# Patient Record
Sex: Male | Born: 1950
Health system: Southern US, Community
[De-identification: ages and names within clinical notes are randomized; demographics above are authoritative.]

## PROBLEM LIST (undated history)

## (undated) DIAGNOSIS — F79 Unspecified intellectual disabilities: Secondary | ICD-10-CM

## (undated) DIAGNOSIS — Z87442 Personal history of urinary calculi: Secondary | ICD-10-CM

## (undated) DIAGNOSIS — Z8739 Personal history of other diseases of the musculoskeletal system and connective tissue: Secondary | ICD-10-CM

## (undated) DIAGNOSIS — R339 Retention of urine, unspecified: Secondary | ICD-10-CM

## (undated) DIAGNOSIS — I10 Essential (primary) hypertension: Secondary | ICD-10-CM

## (undated) DIAGNOSIS — E119 Type 2 diabetes mellitus without complications: Secondary | ICD-10-CM

---

## 2002-03-27 ENCOUNTER — Ambulatory Visit (HOSPITAL_COMMUNITY): Admission: RE | Admit: 2002-03-27 | Discharge: 2002-03-27 | Payer: Self-pay | Admitting: Internal Medicine

## 2003-03-30 ENCOUNTER — Ambulatory Visit (HOSPITAL_COMMUNITY): Admission: RE | Admit: 2003-03-30 | Discharge: 2003-03-30 | Payer: Self-pay | Admitting: Oral & Maxillofacial Surgery

## 2003-03-30 ENCOUNTER — Ambulatory Visit (HOSPITAL_BASED_OUTPATIENT_CLINIC_OR_DEPARTMENT_OTHER): Admission: RE | Admit: 2003-03-30 | Discharge: 2003-03-30 | Payer: Self-pay | Admitting: Oral & Maxillofacial Surgery

## 2005-01-16 ENCOUNTER — Inpatient Hospital Stay (HOSPITAL_COMMUNITY): Admission: AD | Admit: 2005-01-16 | Discharge: 2005-01-24 | Payer: Self-pay | Admitting: Internal Medicine

## 2009-04-29 ENCOUNTER — Ambulatory Visit (HOSPITAL_COMMUNITY): Admission: RE | Admit: 2009-04-29 | Discharge: 2009-04-29 | Payer: Self-pay | Admitting: Family Medicine

## 2009-11-11 ENCOUNTER — Ambulatory Visit (HOSPITAL_COMMUNITY): Admission: RE | Admit: 2009-11-11 | Discharge: 2009-11-11 | Payer: Self-pay | Admitting: Urology

## 2009-12-01 ENCOUNTER — Ambulatory Visit (HOSPITAL_COMMUNITY): Admission: RE | Admit: 2009-12-01 | Discharge: 2009-12-01 | Payer: Self-pay | Admitting: Urology

## 2010-01-24 ENCOUNTER — Ambulatory Visit (HOSPITAL_COMMUNITY): Admission: RE | Admit: 2010-01-24 | Discharge: 2010-01-24 | Payer: Self-pay | Admitting: Urology

## 2010-02-01 ENCOUNTER — Ambulatory Visit (HOSPITAL_COMMUNITY): Admission: RE | Admit: 2010-02-01 | Discharge: 2010-02-01 | Payer: Self-pay | Admitting: Urology

## 2010-02-08 ENCOUNTER — Ambulatory Visit (HOSPITAL_COMMUNITY)
Admission: RE | Admit: 2010-02-08 | Discharge: 2010-02-08 | Payer: Self-pay | Source: Home / Self Care | Admitting: Urology

## 2010-03-22 ENCOUNTER — Ambulatory Visit (HOSPITAL_COMMUNITY)
Admission: RE | Admit: 2010-03-22 | Discharge: 2010-03-22 | Payer: Self-pay | Source: Home / Self Care | Admitting: Urology

## 2010-03-31 ENCOUNTER — Ambulatory Visit (HOSPITAL_COMMUNITY)
Admission: RE | Admit: 2010-03-31 | Discharge: 2010-03-31 | Payer: Self-pay | Source: Home / Self Care | Attending: Urology | Admitting: Urology

## 2010-05-07 ENCOUNTER — Encounter: Payer: Self-pay | Admitting: Family Medicine

## 2010-06-28 LAB — GLUCOSE, CAPILLARY
Glucose-Capillary: 101 mg/dL — ABNORMAL HIGH (ref 70–99)
Glucose-Capillary: 122 mg/dL — ABNORMAL HIGH (ref 70–99)

## 2010-06-30 LAB — CBC
HCT: 39.9 % (ref 39.0–52.0)
Hemoglobin: 13.4 g/dL (ref 13.0–17.0)
RBC: 4.45 MIL/uL (ref 4.22–5.81)

## 2010-06-30 LAB — BASIC METABOLIC PANEL
BUN: 10 mg/dL (ref 6–23)
Calcium: 9.3 mg/dL (ref 8.4–10.5)
GFR calc Af Amer: >60 mL/min (ref >60)
GFR calc non Af Amer: >60 mL/min (ref >60)

## 2010-06-30 LAB — SURGICAL PCR SCREEN
MRSA, PCR: NEGATIVE
Staphylococcus aureus: NEGATIVE

## 2010-06-30 LAB — GLUCOSE, CAPILLARY: Glucose-Capillary: 154 mg/dL — ABNORMAL HIGH (ref 70–99)

## 2010-09-01 NOTE — Consult Note (Signed)
[name], DOLLAR               ACCOUNT NO.:  [id number]   MEDICAL RECORD NO.:  [id number]          PATIENT TYPE:  INP   LOCATION:  A215                          FACILITY:  [facility]   PHYSICIAN:  Kofi A. Gerilyn Pilgrim, M.D. DATE OF BIRTH:  01-03-1951   DATE OF CONSULTATION:  DATE OF DISCHARGE:                                   CONSULTATION   NEUROLOGICAL CONSULTATION   IMPRESSION:  Recurrent spells that are suspicious for seizures.  It is  unclear if these are provoked seizures or epileptic seizures, however, the  patient has had epilepsy in the past although he outgrew this.  Additionally  he has a baseline history of static encephalopathy.  Given the above, I  believe it is reasonable to treat the patient for presumed epileptic  seizures, although, again, we cannot say for sure these represent the  recurrence of epileptic seizures or provoked seizures.  For now we will  treat him and do a seizure work up including electroencephalogram and MRI.  If he remains seizure-free on medications he may be candidate for weaning  and discontinuation of the medication.   HISTORY:  This is a 60 year old African-American man who has a baseline  history of static encephalopathy.  The patient apparently was born full term  by vaginal delivery without any complications.  At the age of 1 the mother  noted that the patient initially had problems talking and developing.  He  apparently was able to speak some but may have regressed.  It seems more  like, however, that the patient may have not developed speaking ability.  It  appears, however, that he was able to ambulate, walk and talk well, the main  problem seemed to be a cognitive developmental delay and speaking  impairment.  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  The  family indicated he does follow some basic commands.  He cannot read or  write.  He is currently being taken care of by his siblings.  It appears the  patient had  intractable epileptic seizures during childhood but apparently  outgrew then by the age of 44.  His seizure medications were subsequently  discontinued.  It is unclear which seizure medication he was taking.  He has  essentially been seizure-free for the past 35 years until recently when he  was admitted to the hospital for three bouts of seizures, however, they were  associated with significant blood loss due to bleeding from his Foley  catheter site which was placed.  The description of the spells seems  consistent with generalized tonic/clonic events with stiffening, shaking,  loss of consciousness and some postictal lethargy. He had a fourth event  which was less clear if this was a seizure.  It was associated more with his  limbs becoming limp and the patient simply passing out.  Again, all four  events occurred in the setting of gross and significant bleeding from his  Foley catheter site.   PAST MEDICAL HISTORY:  As stated in history of present illness.  Some mutism  with static encephalopathy, remote seizure  disorder which he has been  seizure-free for the past 35 years until recently.  Diabetes and  hypertension.   ADMISSION MEDICATIONS:  1.  Metformin.  2.  Maxzide.   SOCIAL HISTORY:  The patient resides with his siblings, particularly his  brother with help from other siblings.   PHYSICAL EXAMINATION:  GENERAL APPEARANCE:  Examination shows an average-  weight, pleasant man in no acute distress.  VITAL SIGNS:  Temperature 99.1, blood pressure 121/80, pulse 98,  respirations 20.  HEENT:  Evaluation shows head is normocephalic, atraumatic.  NECK:  Supple.  ABDOMEN:  Soft.  EXTREMITIES:  Show significant varicosities in the legs.  NEUROLOGICAL:  Mentation:  Patient is awake, alert, he is mute. He does  follow commands well and briskly especially midline commands.  He is able to  follow some simple appendicular commands but has more difficulty with  commands such as  holding up two fingers on either hand.  Cranial nerves II-  XII evaluation pupils are 3 mm and reactive.  Extraocular movements are  intact. No nystagmus is seen.  Visual fields limited but appear to be  intact.  Facial muscle strength is symmetric.  Tongue is midline.  Uvula is  midline.  shoulder shrug normal.  Motor examination shows antigravity  strength throughout.  Reflexes are symmetric and normal.  Plantar reflexes  downgoing.  He responds to pain symmetrically.  Coordination shows no  tremors, no dysmetria noted.   CLINICAL DATA:  Supportive data includes head CT scan of the brain shows no  acute process, essentially negative CT scan.  Sodium 131, potassium 3.5,  chloride 96, cO2 19, creatinine 1.0, glucose 188, calcium 8.5, white blood  cell count 4.9, hemoglobin 12.6 and platelet count 144,000.  Chest x-ray  shows bilateral bibasilar atelectasis with cardiomegaly.  Liver enzymes  normal.  Urinalysis is negative.   Thank you for this consultation.      Kofi A. Gerilyn Pilgrim, M.D.  Electronically Signed     KAD/MEDQ  D:  01/17/2005  T:  01/17/2005  Job:  [id number]

## 2010-09-01 NOTE — Group Therapy Note (Signed)
[name], VANNOTE               ACCOUNT NO.:  [id number]   MEDICAL RECORD NO.:  [id number]          PATIENT TYPE:  INP   LOCATION:  A215                          FACILITY:  [facility]   PHYSICIAN:  Catalina Pizza, M.D.        DATE OF BIRTH:  1950-06-24   DATE OF PROCEDURE:  01/23/2005  DATE OF DISCHARGE:                                   PROGRESS NOTE   SUBJECTIVE:  The patient is alert, sitting in chair with family member by  side.  No acute distress.  The patient has been rubbing his belly, but has  not had any specific complaints to any of his family members.  He continues  to have mild pink discoloration in his Foley bag.   OBJECTIVE:  VITAL SIGNS:  Temperature 98.0, systolic blood pressure 122/57,  pulse 89, respirations 20.  CBG's have ranged from 214, 119, 171, 195.  GENERAL APPEARANCE:  Well-nourished African American male in no acute  distress.  HEENT:  Pupils equal, round and reactive to light and accommodation.  Oropharynx is clear.  HEART:  Regular rate and rhythm.  No murmurs, gallops, rubs.  LUNGS:  Clear to auscultation bilaterally.  ABDOMEN:  Mildly protuberant but soft.  No signs or tenderness in palpating  epigastric area and upper quadrant per patient's expression.  Positive bowel  sounds.  EXTREMITIES:  Pulse 2+ in all extremities.  NEUROLOGICAL:  No deficits.  Foley in place.  Continues to have some mild  pinkness discoloration to the urine.   LABORATORY DATA:  CBC 9.3, hemoglobin 13.4, platelets 220,000.  B-met shows  sodium 132, potassium 3.3, chloride 91, CO2 33, glucose 208, BUN 16,  creatinine 1.0, calcium 9.0.   ASSESSMENT:  African American male here for treatment of tonic clonic  seizures and also having continued hematuria from Foley trauma.   PLAN:  1.  Tonic clonic seizures, was previously evaluated by Dr. Gerilyn Pilgrim and will      continue on the valproic acid for seizure prophylaxis.  2.  Hypertension.  Blood pressure under good control.  Continue  Maxzide once      daily.  3.  History of hypokalemia, does have some mild low potassium at this time      at 3.3.  Will start on a daily dose of low-dose potassium, probably      secondary to Maxzide.  4.  Diabetes mellitus type 2.  Blood sugars have been ranging in the 100-200      range.  Not ideal control at this time, but just started on increased      dose of metformin.  May need to add further medications to his regimen,      once we are stable.  5.  Hypercholesterolemia.  Continue Lipitor.  6.  Foley trauma.  Dr. Jerre Simon consulted on the patient yesterday and      continued to flush Foley q.8h.  Urine appears to be more clear today      than previous, and I appreciate consult for that.  If I feel like it is  much clear in the      morning, will discontinue Foley and make sure patient can urinate on his      own and possibly discharge.  7.  Anemia.  The patient's hemoglobin today is 13.4, up from 11.5 several      days ago.  I do not need to have any further intervention.      Catalina Pizza, M.D.  Electronically Signed     ZH/MEDQ  D:  01/23/2005  T:  01/23/2005  Job:  [id number]

## 2010-09-01 NOTE — Consult Note (Signed)
NAMESEGUNDO, MAKELA               ACCOUNT NO.:  [id number]   MEDICAL RECORD NO.:  [id number]          PATIENT TYPE:  INP   LOCATION:  A215                          FACILITY:  [facility]   PHYSICIAN:  Ky Barban, M.D.DATE OF BIRTH:  Aug 26, 1950   DATE OF CONSULTATION:  DATE OF DISCHARGE:                                   CONSULTATION   A 61 year old gentleman who is a patient of Dr. Catalina Pizza.  I was asked to  see him because he has gross hematuria. The patient has a lifelong history  of having seizures.  He presented in the emergency room.  For some reason, a  Foley catheter was inserted.  The patient's Foley catheter __________.  He  continued to have hematuria.  He was brought back and now has a Foley  catheter.  Urine still grossly bloody, so I was asked to see him.  The  history is obtained through the chart.  The patient was unable to give any  history.   I have looked at his Foley catheter.  The urine is almost clear.   IMPRESSION:  He probably has some blood clots in his bladder, so will  irrigate his bladder and keep the catheter in until the urine clears up.  It  should stop bleeding now.  I will follow him.   Thank you Dr. Margo Aye in allowing [place] to see this patient.      Ky Barban, M.D.  Electronically Signed     MIJ/MEDQ  D:  01/22/2005  T:  01/22/2005  Job:  [id number]

## 2010-09-01 NOTE — Discharge Summary (Signed)
[name], [name]               ACCOUNT NO.:  [id number]   MEDICAL RECORD NO.:  [id number]          PATIENT TYPE:  INP   LOCATION:  A215                          FACILITY:  [facility]   PHYSICIAN:  Catalina Pizza, M.D.        DATE OF BIRTH:  06/06/1950   DATE OF ADMISSION:  01/16/2005  DATE OF DISCHARGE:  10/11/2006LH                                 DISCHARGE SUMMARY   DISCHARGE DIAGNOSES:  1.  Seizure disorder/tonic-clonic seizures.  2.  Hypertension.  3.  Hypokalemia.  4.  Diabetes mellitus type 2.  5.  Hypercholesterolemia.  6.  Foley trauma.  7.  Anemia.   DISCHARGE MEDICATIONS:  1.  Maxzide 37.5/25 once daily.  2.  Lipitor 20 mg p.o. daily.  3.  Metformin XR 1000 mg p.o. daily.  4.  Depakene 250 mg 5 mL take 10 mL b.i.d.  5.  Septra DS take one tablet daily until Foley removed.   BRIEF HISTORY OF PRESENT ILLNESS:  [name] is a 60 year old African-  American male with the above mentioned medical problems who came in with  syncopal type event, question with whether seizure activity was apparent.  He had had some slight blood loss from wound on his right leg as well as  Foley trauma from upon discharge from the emergency department prior to his  admission.  He was assessed by Dr. Gerilyn Pilgrim and felt that this, given his  history, could be seizure activity and was admitted for further evaluation.   LABORATORIES:  Pertinent laboratories during the hospitalization and upon  discharge:  Hemoglobin A1c of 7.4.  Lipid profile showed total cholesterol  108, HDL 36, LDL 64.  Valproic acid level drawn on October 9 was 79.  Initial CBC was 12.6 and trended down to a low of 10.7 and then upon  discharge hemoglobin was back up to 12.5.  Last known BMET upon discharge  was sodium 132, potassium 3.3, chloride 91, CO2 of 33, glucose 208, BUN of  16, creatinine of 1, calcium of 9.   Imaging studies upon admission showed a CT scan which showed negative  noncontrasted head CT for any  abnormalities.  Chest x-ray showed low lung  volumes with bibasilar atelectasis and cardiomegaly, but no significant  pulmonary process.   PHYSICAL EXAMINATION:  VITAL SIGNS:  Temperature 97.5, systolic blood  pressure 123/75, pulse 88, respirations 20.  CBGs were 226, 178, 152, 201,  respectively over the last day.  GENERAL:  Well-nourished African-American male in no acute distress.  HEENT:  Pupils are equal, round, and reactive to light and accommodation.  Oropharynx clear.  HEART:  Regular rate and rhythm.  No murmurs, rubs, or gallops.  LUNGS:  Clear to auscultation bilaterally.  ABDOMEN:  Mildly protuberant, but soft.  No tenderness.  Positive bowel  sounds.  EXTREMITIES:  2+ pulses in all extremities.  No signs of bleeding in his  legs from his leg wound well healed at this time.  NEUROLOGIC:  No deficits.  GENITOURINARY:  Foley in place and dressing is in place around penis with no  significant  bleeding noted.  Urine appears clear at this time.   ASSESSMENT:  A 60 year old African-American male with tonic-clonic seizures  and here due to Foley trauma.   HOSPITAL COURSE:  #1 - TONIC-CLONIC SEIZURES:  Was assessed by Dr. Gerilyn Pilgrim  and attempted to get MRI but due to agitation was unable to do this.  Is to  follow up with Dr. Gerilyn Pilgrim in one month and continue on the Depakote 500 mg  b.i.d.  His level was checked and was within therapeutic range during  hospitalization.  He has not had any other seizure activity at this time.   #2 - FOLEY TRAUMA:  He upon admission had pulled out his Foley and caused  significant bleeding and Foley was replaced two days later and then even  with Foley in placed continued to have bleeding and hematuria.  Dr. Jerre Simon  was consulted and assessed patient today on day of discharge he was called  for bleeding around Foley and dressing was placed and replacement of Foley  was done.  Patient will need follow-up with Dr. Jerre Simon or Dr. Rito Ehrlich in  the  next two to five days to remove the Foley and assess if any further  bleeding problem.  Family will be instructed on Foley care for the patient.  Will continue on Septra DS once daily until Foley is removed for UTI  prophylaxis.   #3 - HYPERTENSION:  Patient is under good control.  Continue on adjusted  Maxzide once daily.   #4 - HISTORY OF HYPOKALEMIA:  May be secondary to the Maxzide and will hold  off on his potassium and recheck as an outpatient if still low and will  start back on routine potassium daily.   #5 - DIABETES MELLITUS TYPE 2:  Sugars have ranged from under 200 range and  had slight elevated hemoglobin A1c.  Increased his dose of Metformin to 1000  mg daily, but may require further treatment with TZD if possible.  Will  follow up as outpatient for this.   #6 - HYPERCHOLESTEROLEMIA:  Continue Lipitor.  His cholesterol was good  while checked in the hospital.   #7 - ANEMIA:  Patient had slight anemia, but is at baseline at this time and  will continue to monitor if have any further bleeding episodes.   DISPOSITION:  Patient will follow up with Dr. Renard Matter in one week and Dr.  Gerilyn Pilgrim in one month and will need to call Dr. Jerre Simon and Dr. Rito Ehrlich in  the next two to five days to get Foley taken out.      Catalina Pizza, M.D.  Electronically Signed     ZH/MEDQ  D:  01/24/2005  T:  01/24/2005  Job:  [id number]

## 2010-09-01 NOTE — Group Therapy Note (Signed)
[name], [name]               ACCOUNT NO.:  [id number]   MEDICAL RECORD NO.:  [id number]          PATIENT TYPE:  INP   LOCATION:  A215                          FACILITY:  [facility]   PHYSICIAN:  Catalina Pizza, M.D.        DATE OF BIRTH:  1950/11/14   DATE OF PROCEDURE:  01/22/2005  DATE OF DISCHARGE:                                   PROGRESS NOTE   SUBJECTIVE:  The patient is alert, sitting in chair with family member by  side.  No acute distress.  No significant complaints.  Does continue to have  hematuria in his Foley bag.   OBJECTIVE:  VITAL SIGNS:  Temperature 97.6, blood pressure 122/52, pulse 84,  respirations 20, CBG's are 141, 187, 127, 177, 172 over the last day.  GENERAL APPEARANCE:  Well-nourished African American male.  No acute  distress.  HEENT:  Pupils equal, round and reactive to light and accommodation.  Oropharynx is clear.  HEART:  Regular rate and rhythm.  No murmurs, gallops, rubs.  LUNGS:  Clear to auscultation bilaterally.  ABDOMEN:  Soft, nontender, nondistended.  Positive bowel sounds.  EXTREMITIES:  2+ pulses in the lower extremities.  No signs of bleed from  his right leg.  NEUROLOGICAL:  No deficits.  Foley in place.  Continues to have blood in his  urine.   LABORATORY DATA:  Valproic acid level is therapeutic at 79.   ASSESSMENT:  African American male in the hospital for treatment of  tonic/clonic seizures, suspected continued hospitalization due to Foley  trauma and continued hematuria.   PLAN:  1.  Tonic clonic seizures, evaluated by Dr. Gerilyn Pilgrim.  Continue on the      valproic acid.  2.  Hypertension.  Blood pressure is under good control.  Continue Maxzide      once daily.  3.  History of mild headache with kalemia.  No signs of any further low      potassium.  Will recheck in the morning.  4.  Diabetes mellitus type 2.  Blood sugars in the okay range on increased      dose of Metformin.  Will continue this at this time.  5.   Hypercholesterolemia.  Continue Lipitor 20 mg.  6.  Foley trauma.  The patient has had Foley in place for approximately      three days now, and hematuria did resolve for a short period of time,      but now has resumed over the last day.  Will get Dr. Rito Ehrlich involved      for any further suggestions and treatment options.  7.  Anemia.  Last check of his hemoglobin was 11.5 which appears to be      stable.  Will check again in the morning for this.   DISPOSITION:  Given the patient's mental retardation and mutism, I feel that  it would be inappropriate to discharge him to home at this time with Foley  in place due to risk of further Foley trauma until issue has a good plan as  far as resolution.  Await  Dr. Chancy Milroy consultation.      Catalina Pizza, M.D.  Electronically Signed     ZH/MEDQ  D:  01/22/2005  T:  01/22/2005  Job:  [id number]

## 2010-09-01 NOTE — Group Therapy Note (Signed)
NAMEARLYN, [name]               ACCOUNT NO.:  [id number]   MEDICAL RECORD NO.:  [id number]          PATIENT TYPE:  INP   LOCATION:  A215                          FACILITY:  [facility]   PHYSICIAN:  Catalina Pizza, M.D.        DATE OF BIRTH:  05-04-1950   DATE OF PROCEDURE:  01/17/2005  DATE OF DISCHARGE:                                   PROGRESS NOTE   SUBJECTIVE:  The patient is alert, sitting in bed. Does not have any  significant complaints. The patient does not talk and so communicating  mostly with his aunt who states that he has not had any significant  complaints and had normal urination without any reddish tinge or blood in  his urine.   OBJECTIVE:  VITAL SIGNS:  Temperature is 97.4, pulse 78, blood pressure  128/65, respirations 20, CBCs have been 151, 174, and 152.  GENERAL:  Well-nourished, African-American male in no acute distress.  HEENT:  Pupils are equal, round, and reactive to light and accommodation.  Oropharynx is clear.  NECK:  Supple.  HEART:  Regular rate and rhythm. No murmurs, gallops, or rubs.  LUNGS:  Clear to auscultation bilaterally.  ABDOMEN:  Soft, nontender, nondistended, positive bowel sounds.  EXTREMITIES:  2+ pulses in all extremities. Right leg is still bandaged. I  do not have any further episodes or any further signs of bleeding.  NEUROLOGICAL:  Cranial nerves II-XII intact. The patient does not talk but  cannot follow commands rudimentarily.  MUSCULOSKELETAL:  Moving all extremities. No significant deficits  appreciated.   LABORATORY DATA:  CBC showed white count of 7.7, hemoglobin 12.2, platelet  count 158. Sodium 136, potassium 3.4, chloride 102, CO2 27, glucose 172, BUN  9, creatinine 0.9, calcium 8.8. PT/INR is 14.4 and INR of 1.1.   ASSESSMENT/PLAN:  1.  Mr. Bais is a 60 year old African-American male who presented with      weakness and presyncopal type spell. Initial impression is related to      vasovagal type symptoms following sight  of blood, but given his complex      history and history of previous seizures, monitoring for any further      activity.  2.  Hypertension appears to be under good control. Continue on the Maxzide      37.5/25.  3.  Mild hypokalemia. Will replete with potassium today.  4.  Diabetes mellitus type 2. He is on sliding scale insulin at this time,      but upon discharge, I feel that need to increase his metformin XR to      1,000 mg daily.  5.  Hypercholesterolemia. He is to continue on Lipitor 20 mg p.o. daily for      this, and a lipid profile is pending as well as hemoglobin A1c for his      diabetes.   DISPOSITION:  If neurology is able to evaluate him today, that would be  great, but I feel the patient is not exhibiting any other further problems  at this time and if okay will be discharged later this  afternoon or in the  a.m. depending on consultation from neurology.      Catalina Pizza, M.D.  Electronically Signed     ZH/MEDQ  D:  01/17/2005  T:  01/17/2005  Job:  [id number]

## 2010-09-01 NOTE — Op Note (Signed)
[name], [name]                         ACCOUNT NO.:  [id number]   MEDICAL RECORD NO.:  [id number]                   PATIENT TYPE:  AMB   LOCATION:  DSC                                  FACILITY:  [facility]   PHYSICIAN:  Dorthula Matas, D.D.S.           DATE OF BIRTH:  Aug 17, 1950   DATE OF PROCEDURE:  03/30/2003  DATE OF DISCHARGE:                                 OPERATIVE REPORT   PREOPERATIVE DIAGNOSIS:  Mental retardation, severe periodontal disease and  severe dental disease and decay.   POSTOPERATIVE DIAGNOSIS:  Mental retardation, severe periodontal disease and  severe dental disease and decay.   PROCEDURE:  Removal of all remaining teeth plus alveoloplasties of the  maxillary right, mandibular right, maxillary left and mandibular left as  well as anterior regions of the upper and lower jaws.   SURGEON:  Dorthula Matas, D.D.S.   ANESTHESIA:  General via nasal endotracheal tube.   CULTURES:  None.   DRAINS:  None.   SPECIMENS:  Multiple teeth, not submitted including teeth 1, 2, 3, 4, 5, 6,  7, 8, 32, 30, 28, 27, 26, 9, 10, 11, 12, 13, 20, 21, 22 and 23.   PROCEDURE:  The patient was brought to the OR and placed on the OR table in  a supine position.  He was then placed under general anesthesia and the  nasal endotracheal tube was inserted.  The patient was intubated and he was  maintained under general anesthesia and prepped and draped in a sterile  manner for an oral maxillofacial surgical procedure.  A moistened throat  pack was placed.  At this point in time, 2% Xylocaine with 1:100,000  epinephrine was used to give right and left maxillary blocks, right and left  mandibular blocks, palatal blocks and long buccal nerve blocks.  A total of  9 mL of this solution was administered. At this point, a 15 blade was used  to make an incision starting in the maxillary right area over the tuberosity  extending into the buccal sulcus of the teeth 1, 2, 3, 4, 5, 6, 7  and 8.  A  full thickness mucoperiosteal flap was reflected with the periosteal  elevator.  Bone was removed around these teeth.  The teeth were subluxated  with a 40 elevator and the teeth were surgically removed. All of these teeth  were erupted teeth except for tooth #8 which was root tip.  Once the teeth  were removed, the area was curetted out and the associated soft tissue and  granulation tissue were removed.  The sockets were suctioned free of debris.  Avitene was placed in the sockets and then a 3-0 chromic gut suture was  placed in a running continuous fashion to provide good closure of the  maxillary wound.  At this point, my attention was directed to the mandibular  right where an incision was made in the buccal sulcus of  32, to tooth #30,  to tooth #28, to teeth #27 and 26. A lateral release was made distal to  tooth #32 in a lateral hockey stick design. A full thickness mucoperiosteal  flap was reflected.  Again, bone was removed around these remaining teeth.  Tooth #32 was surgically removed.  Tooth #30 was root tips and it was  removed in pieces.  Tooth #28 was a root tip and it was removed.  Teeth 27  and 26 were erupted and they were removed.  The sockets were curetted out.  Any soft tissue debris was removed.  The alveoloplasty bur was used to  smooth the alveolus of the mandible on the lower right as well as on the  upper right. After the alveoloplasty was accomplished again, 3-0 chromic gut  suture was placed to provide good closure and Avitene was placed in the  sockets prior to suturing.  At this point, my attention was directed to the  left side where again an incision was made distal to tooth #30 extending to  the buccal sulcus of 13, 12, 11, 10 and 9.  Again, a full thickness  mucoperiosteal flap was reflected.  Bone was removed from  the facial aspect  of these teeth.  The teeth were subluxated with a 40 elevator and were  removed with a 150 force hook.  The  sockets were curetted out and all  granulation tissue was removed. An alveoloplasty bur was used to further  smooth the bone and then again all was irrigated. Avitene was placed in the  sockets and 3-0 chromic gut suture was used in a continuous fashion to  provide good surgical closure. Finally, my attention was directed to the  mandibular left where an incision was made from the edentulous area of tooth  #17 anteriorly into the buccal sulcus of tooth 20, 21, 22 and 23.  A full  thickness mucoperiosteal flap was reflected.  The area of #17 which was  somewhat unclear on x-ray was inspected, but no tooth was seen.  Bone was  removed from the facial surfaces of 20, 21, 22 and 23.  A 40 elevator was  used to subluxate these teeth and the teeth were removed with an Ashe  bicuspid forceps.  Once these teeth were removed, an alveoloplasty bur was  used to smooth the bone and the sockets were curetted out. Avitene was  placed and again 3-0 chromic gut suture was placed in a continuous fashion  starting back in the 17 area and coming anteriorly to the 23 area.  The  patient tolerated the procedure well. The oral cavity was copiously  irrigated with normal saline and suctioned free of debris.  The throat pack  was removed.  The patient had an oral airway placed and was awakened in the  operating room and at the time left the operating room was in a stable  condition and almost ready for extubation.  The patient is going to be  followed in [facility] day surgery recovery room and eventually if he remains  stable be discharged in the care of his aunt to go back to the nursing home  area where he is.                                               Dorthula Matas, D.D.S.    SWS/MEDQ  D:  03/30/2003  T:  03/30/2003  Job:  [id number]

## 2010-09-01 NOTE — Group Therapy Note (Signed)
[name], [name]               ACCOUNT NO.:  [id number]   MEDICAL RECORD NO.:  [id number]          PATIENT TYPE:  INP   LOCATION:  A215                          FACILITY:  [facility]   PHYSICIAN:  Edward L. Juanetta Gosling, M.D.DATE OF BIRTH:  January 27, 1951   DATE OF PROCEDURE:  01/21/2005  DATE OF DISCHARGE:                                   PROGRESS NOTE   [name] remains unable to speak as always. His bowels have been moving so  that is an improvement. His nausea seems to have resolved. He did receive  some Phenergan.   OBJECTIVE:  His exam today shows his temperature 97.1, pulse 81,  respirations 20, blood sugar 141, blood pressure 120/68. Chest is clear.  Heart is regular. His urine is clear.  The catheter is still in place today.  I did not discontinue it yesterday, but I think will be able to do that  today. Neurologically except for his mutism he is intact. Laboratory work --  none was done today.   ASSESSMENT:  1.  My assessment, then, is that he has tonic-clonic seizure on Depakote.      Will check a Depakote level tomorrow.  2.  He has diabetes under better control than yesterday.  3.  Hypertension, excellent today.  4.  Foley catheter trauma.  I am going to go ahead asked for the catheter to      be removed.  5.  Congenital mutism.   PLAN:  Plan, then, to try to get him up.  Check a Depakote level in the  morning.  Discontinue his Foley catheter.      Edward L. Juanetta Gosling, M.D.  Electronically Signed     ELH/MEDQ  D:  01/21/2005  T:  01/22/2005  Job:  [id number]

## 2010-09-01 NOTE — Group Therapy Note (Signed)
[name], [name]               ACCOUNT NO.:  [id number]   MEDICAL RECORD NO.:  [id number]          PATIENT TYPE:  INP   LOCATION:  A215                          FACILITY:  [facility]   PHYSICIAN:  Catalina Pizza, M.D.        DATE OF BIRTH:  September 26, 1950   DATE OF PROCEDURE:  01/18/2005  DATE OF DISCHARGE:                                   PROGRESS NOTE   SUBJECTIVE:  The patient is alert and sitting in bed with family members at  his side.  The patient does not appear to be in any distress and although he  does not talk, he does nod his head occasionally and responds better to his  family's questions.  Per nursing, he continues to have gross blood in his  urine, but is unclear and has not been visualized by the nurse at this time.   OBJECTIVE:  VITAL SIGNS:  Temperature 98.1, blood pressure 121/66, heart  rate 76, respirations 18.  CBGs are 198, 170, 170 and 148 respectively for  the last day.  GENERAL:  Well-nourished, African-American male in no acute distress.  HEENT:  Pupils equal round and reactive to light and accommodation.  Oropharynx is clear.  HEART:  Regular rate and rhythm.  LUNGS:  Clear to auscultation bilaterally.  ABDOMEN:  Soft, nontender, nondistended, positive bowel sounds.  EXTREMITIES:  2+ pulses.  Right leg showing small punctate hole in his right  leg which was packed a small amount of gauze with no active bleeding.  Will  leave that intact for another day.  NEUROLOGIC:  No deficits appreciate.  The patient continues to be mute, but  following commands appropriately.   White count 6.3, hemoglobin 11.3, platelets 157.  Hemoglobin A1c 7.4.  Total  cholesterol 108, HDL 36, LDL 64.   ASSESSMENT/PLAN:  1.  Tonic-clonic seizures.  Appreciate Dr. Ronal Fear input on this.  Will      try to get magnetic resonance imaging again today.  Apparently, he was a      little agitated yesterday and fearful without family.  Will send family      member down as far as they can go to  get magnetic resonance imaging for      support.  For that, he was started on Depakote for seizure prevention at      this time and will continue that and monitor for any further seizure      activity.  2.  Hypertension.  Blood pressure appears to be under control at this time.      Continue the Maxzide at current dose.  3.  History of mild hypokalemia.  Will check potassium in the morning.  4.  Diabetes mellitus type 2.  Given his hemoglobin A1c of 7.4, not ideal      control at this time limiting his diet as far as sugar is concerned.  We      will also increase his metformin dose.  5.  Hypercholesterolemia.  He appears to be doing well on his Lipitor 20 and      will not  make any changes with that.  6.  Foley trauma.  He continues to have bleeding in his urine.  Will check      another hemoglobin and hematocrit this afternoon.  If still having gross      type blood, will replace Foley and may need to get Dr. Chancy Milroy input.      I spoke with him yesterday and may have to replace Foley to tamponade      some of this bleeding.  7.  Anemia.  Mild anemia to start with, but given his hemoglobin dropped to      11.3, the patient may be losing more blood than has been appreciated      before.  Will recheck another hemoglobin and hematocrit at noon today      and see if need to place Foley to stop bleeding from Foley trauma.      Catalina Pizza, M.D.  Electronically Signed     ZH/MEDQ  D:  01/18/2005  T:  01/18/2005  Job:  [id number]

## 2010-09-01 NOTE — Group Therapy Note (Signed)
NAMEJAHI, ROZA               ACCOUNT NO.:  [id number]   MEDICAL RECORD NO.:  [id number]          PATIENT TYPE:  INP   LOCATION:  A215                          FACILITY:  [facility]   PHYSICIAN:  Catalina Pizza, M.D.        DATE OF BIRTH:  03-08-1951   DATE OF PROCEDURE:  DATE OF DISCHARGE:                                   PROGRESS NOTE   SUBJECTIVE:  The patient is alert, sitting in bed with some family members  at his side.  The patient is in no distress and appears to be at his  baseline per his family's interaction.  Yesterday he continued to have gross  blood in his urine and so a Foley was placed to help tamponade some Foley  trauma which he had sustained just prior to admission.  Apparently he was  scheduled to go for an MRI yesterday, but due to agitation was unable to go  for that.   OBJECTIVE:  Vital Signs:  Systolic 106/diastolic 65, temperature is 98.3,  pulse is 84, respirations are 18.  CBGs over the last day are 169, 169, 124  and 150 respectively.  General:  This is a well-nourished African-American  male in no acute distress.  HEENT:  Pupils are equal, round and reactive to  light and accommodation.  Oropharynx is clear.  Heart:  A regular rate and  rhythm.  Lungs:  Clear to auscultation bilaterally.  Abdomen:  Soft,  nontender, nondistended.  Positive bowel sounds.  Extremities:  Two-plus  pulses in all extremities.  Right leg showed healing, small, punctate hole  to his right lower shin.  No active bleeding.  Neurologic:  No deficits  appreciated.  The patient continues to be mute but following commands  appropriately.   LABORATORY RESULTS:  A CBC this morning showed a white count of 7.6,  hemoglobin of 10.7, platelet count 157.  A BMET of 134.  Potassium 3.3,  chloride 95, CO2 of 34, glucose 155, BUN 8, creatinine 0.9, calcium of 8.9.   ASSESSMENT AND PLAN:  1.  Tonic-clonic seizures.  Was evaluated by Dr. Gerilyn Pilgrim and again tried to      obtain MRI but unclear if  we will get this secondary to his agitation.      He is to continue on Depakote for seizure prevention.  2.  Hypertension.  Blood pressure is under good control at this time.      Continue on Maxzide once daily.  3.  A history of mild hypokalemia.  He, again, has a potassium of 3.3.  We      will replete further today with a low dose of potassium.  4.  Diabetes mellitus type 2.  Blood sugars appear to be doing well with the      increased dose of metformin.  We will continue that at this time.  5.  Hypercholesterolemia.  Continue with the Lipitor 20.  6.  Foley trauma.  The patient had Foley replaced yesterday, and there are      no signs of gross hematuria or any tea-colored urine  at this time with      Foley back ain place.  Leave in at least 2-3 days without any signs of      bleeding.  7.  Anemia.  The patient's blood count trended down slightly again to 10.7      from 11.3.  We will check again in the morning to see if trending down      any further, but I do not feel this will be the case secondary to      stopping the      bleeding from the Foley trauma.  Given the patient's mental dysfunction      do not want to send patient home with a Foley catheter in place due to      risk of further trauma due to agitation.  Patient may be able to be      discharged following removal of the Foley catheter without any further      signs of bleeding apparent and stable hemoglobin.      Catalina Pizza, M.D.  Electronically Signed     ZH/MEDQ  D:  01/19/2005  T:  01/19/2005  Job:  [id number]

## 2010-09-01 NOTE — Consult Note (Signed)
[name], [name]                           ACCOUNT NO.:  [id number]   MEDICAL RECORD NO.:  [id number]                  PATIENT TYPE:   LOCATION:                                       FACILITY:   PHYSICIAN:  Lionel December, M.D.                 DATE OF BIRTH:  23-Jul-1950   DATE OF CONSULTATION:  03/10/2002  DATE OF DISCHARGE:                                   CONSULTATION   REASON FOR CONSULTATION:  Colonoscopy, rectal bleeding.   HISTORY OF PRESENT ILLNESS:  The patient is a 60 year old black gentleman  with mental retardation who presents today with his aunt, [name].  He presents today for further evaluation of rectal bleeding and possible  colonoscopy at the request of Dr. Renard Matter.  The patient does not verbally  respond.  He has had mental retardation since childhood.  According to his  aunt, they noticed bright red blood in his underpants.  He has not appeared  to have any abdominal pain.  No nausea or vomiting, diarrhea, or  constipation.  No evidence of melena.  His weight has been stable.  He eats  well.  He has never had a colonoscopy.  There is no family history of  colorectal cancer.   CURRENT MEDICATIONS:  1. Lipitor 20 mg q.d.  2. Glucophage 500 mg q.d.   ALLERGIES:  No known drug allergies.   PAST MEDICAL HISTORY:  1. Hypercholesterolemia.  2. Non-insulin-dependent diabetes mellitus.  3. Mental retardation.   FAMILY HISTORY:  Negative for colorectal cancer.   SOCIAL HISTORY:  He is single.  He is disabled.  He has some local family  members who assist in his care.  He has never been a smoker.  Denies alcohol  use.   REVIEW OF SYSTEMS:  Unobtainable.   PHYSICAL EXAMINATION:  VITAL SIGNS:  Weight 209, height 5 feet 8 inches.  Temperature 96.2, blood pressure 122/80, pulse 66.  GENERAL:  Very pleasant, middle-aged black male in no acute distress.  He is  accompanied by his aunt, [name].  He has an unkempt appearance.  SKIN:  Warm  and dry.  No jaundice.  HEENT:  Conjunctivae are pink.  Sclerae are nonicteric.  Pupils are equal,  round, and reactive to light.  Oropharyngeal mucosa moist and pink.  No  lesions, erythema, or exudate.  He has several teeth missing.  No  lymphadenopathy or thyromegaly.  CHEST:  Lungs clear to auscultation but somewhat limited due to the  patient's cooperation.  CARDIAC:  Regular rate and rhythm.  Normal S1, S2.  ABDOMEN:  Positive bowel sounds.  Soft, nontender, nondistended.  No  organomegaly or masses.  He has ventral weakness/hernia.  EXTREMITIES:  No edema.  RECTAL:  Performed by Dr. Renard Matter was reported as normal and Hemoccult-  negative.   IMPRESSION:  The patient is a 60 year old black gentleman with mental  retardation who presents  today for further evaluation of rectal bleeding as  noted by his caretakers.  He could have bleed from a benign source such as  hemorrhoids or fissure, but cannot rule out ulceration, polyps, or even  cancer.  I discussed in length today with his aunt, [name],  regarding colonoscopy for further evaluation of his symptoms.  I discussed  risks, alternatives, benefits, and all questions were answered.   PLAN:  1. Colonoscopy in the near future.  2. Further recommendations to follow.   I would like to thank Dr. Renard Matter for allowing [place] to take part in the care  of this patient.     Tana Coast, P.A.                        Lionel December, M.D.    LL/MEDQ  D:  03/10/2002  T:  03/10/2002  Job:  [id number]   cc:   Angus G. Renard Matter, M.D.  [address]  [place]  [place] [address]  Fax: [phone]

## 2010-09-01 NOTE — Group Therapy Note (Signed)
NAMECELEDONIO, SORTINO               ACCOUNT NO.:  [id number]   MEDICAL RECORD NO.:  [id number]          PATIENT TYPE:  INP   LOCATION:  A215                          FACILITY:  [facility]   PHYSICIAN:  Edward L. Juanetta Gosling, M.D.DATE OF BIRTH:  [birth year]   DATE OF PROCEDURE:  01/20/2005  DATE OF DISCHARGE:                                   PROGRESS NOTE   Mr. Glander is a patient of Dr. Renard Matter and Hall's who had a syncopal episode  and apparently there is some question as to whether he had seizure disorder.  He is alert.  According to family he is about as usual. He has had a Foley  catheter placed.  He is on Depakote.   OBJECTIVE:  His exam shows that he is awake and alert. Temperature is 97.1,  pulse 89, respirations 20, blood sugar 140, blood pressure 135/77. He, as  previously noted is mute. His chest is clear. He has had some nausea and he  received Phenergan for this. He has had some constipation, so he is going to  have Colace and Dulcolax. Otherwise he is doing well with no other new  complaints or problems   ASSESSMENT:  1.  It is my assessment then that he has tonic-clonic seizures MRI pending.      He is on Depakote for that.  2.  Hypertension, blood pressure is under excellent control at this point.  3.  Hypokalemia with potassium replaced in normal range now.  4.  Diabetes mellitus type 2.  Blood sugars the highest is 204 somewhat      higher than we would like.  He is on Metformin, we continue that.  5.  Hyperlipidemia.  I did not reevaluate that.  6.  Foley trauma h.s. is not having any urine now.  He is not having any      blood in urine.  I think it is okay for that to come out now. He has had      anemia and his hemoglobin level today 11.5 which is improved.   PLAN:  The plan, then, is to continue with treatments and follow.      Edward L. Juanetta Gosling, M.D.  Electronically Signed     ELH/MEDQ  D:  01/21/2005  T:  01/22/2005  Job:  [id number]

## 2010-09-01 NOTE — H&P (Signed)
[name], [name]               ACCOUNT NO.:  [id number]   MEDICAL RECORD NO.:  [id number]          PATIENT TYPE:  INP   LOCATION:  A215                          FACILITY:  [facility]   PHYSICIAN:  Catalina Pizza, M.D.        DATE OF BIRTH:  03-29-1951   DATE OF ADMISSION:  01/16/2005  DATE OF DISCHARGE:  LH                                HISTORY & PHYSICAL   PRIMARY CARE PHYSICIAN:  Angus G. Renard Matter, MD   HISTORY OF PRESENT ILLNESS:  [name] is a 60 year old African-American  gentleman who presented to the emergency department this morning following a  syncopal-type episode after sight of blood from his right leg.  Apparently  his aunt found him this morning with some blood trickling out of his right  leg of unknown cause.  Upon seeing this, [name] had episode where he  became weak and essentially blacked out while lying on the floor.  He did  not fall or injure himself at that time.  He presented to the emergency  department for further evaluation.  At that time, all lab work was checked  as listed below and was normal per the ER physician.  For some reason, a  Foley was placed in the emergency department which the patient then pulled  out with bulb fully inflated, resulting in some bleeding from his penis and  urine.  The patient was then sent home.  When the patient got home, he went  to use the bathroom and noticed further blood in the toilet and again had a  weakness-type passing out spell and was lowered to the ground.  Question  whether patient had seizure-type activity with abnormal facial expressions,  but what complicates this is that [name] has not spoken much since birth  and has some underlying birth defects which make obtaining history very  difficult.  Almost all this history is obtained from his two aunts who are  present at his bedside this evening.  He then, following this event, was  brought in for direct admit through the emergency department for further  evaluation.   PAST MEDICAL HISTORY:  1.  Hypertension.  2.  Diabetes mellitus, type 2.  3.  Hypercholesterolemia.  4.  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   PAST SURGICAL HISTORY:  None.   MEDICATIONS:  1.  Maxzide 37.5/25 mg p.o. twice daily.  2.  Metformin XR 500 mg p.o. daily.  3.  Lipitor 20 mg p.o. daily.   ALLERGIES:  No known drug allergies.   SOCIAL HISTORY:  He lives with his brother, but he has been doing all of his  ADLs himself including cooking, feeding, dressing, laundry.   FAMILY HISTORY:  Mother died at age 69, had diabetes and had some  complications secondary to diabetes.  Father died at age 71 of suspected  lung cancer and had CABG.  He has 2 brothers and 2 sisters who have  diabetes, hypertension, and some history of heart problems.   REVIEW OF SYSTEMS:  The patient is not showing signs of pain, but Review  of  Systems is limited secondary to patient not talking.   PHYSICAL EXAMINATION:  VITAL SIGNS:  Temperature 98, blood pressure 166/83,  pulse 91, respirations 20.  CBG is 151.  GENERAL:  This is a well-nourished African-American gentleman in no acute  distress, minimal responsiveness to questions but is alert and does listen  to his aunts and responds to questions by them.  HEENT:  Pupils equal, round, and reactive to light and accommodation.  No  scleral icterus.  Oropharynx is clear.  NECK:  No lymphadenopathy, no JVD, no thyromegaly.  Supple.  LUNGS:  Clear to auscultation bilaterally without rhonchi or wheezing.  CARDIOVASCULAR:  Regular rate and rhythm with no murmurs, gallops, or rubs,  2+ pulses in all extremities.  EXTREMITIES:  No lower extremity edema, 2+ pulses all extremities.  NEUROLOGIC:  Cranial nerves appear grossly intact.  No decrease in  sensation.  Normal deep tendon reflexes in all extremities. The patient is  alert but does not respond to any questions posed to him. Per his aunts,  this is approximately at his baseline.  He  has good days and bad days as far  as responsiveness to questions.  MUSCULOSKELETAL:  Moving all extremities appropriately but difficult to  fully ascertain due to patient not understanding commands.  SKIN:  The patient has a bandage to his right leg, apparently had a punctate  puncture which was bleeding earlier today, but bandages are clean and dry at  this time.  The patient did have signs of some bleeding from his penis  following traumatic pulling out of his Foley earlier today, but no  significant bright red blood.   LABORATORY DATA:  Obtained upon admission, CBC shows white count 4.9,  hemoglobin 12.6, platelet count 144.  CMP shows sodium 131, potassium 3.5,  chloride 96, CO2 28, glucose 188, BUN 19, creatinine 1.0, bilirubin 1,  alkaline phosphatase 55, SGOT 22, SGPT 17, total protein 6.7, albumin 3.6,  calium 8.5.  BNP less than 30.  D-dimer 0.55.  Cardiac markers initial set:  CK-MB 3.1, troponin I less than 0.05, myoglobin 142.  UA showed urine  glucose of 100 but was totally negative for everything else.   Chest x-ray just showed cardiac enlargement, low lung volumes with bibasilar  atelectasis and cardiomegaly.   CT of head showed no signs of intracranial hemorrhage, brain edema, or mass  effect.  No intra-atrial or extra-axial fluid collections were appreciated.  Essentially negative noncontrast head CT.   IMPRESSION:  This is a 60 year old African-American male with underlying  mental retardation and aphasia since birth who initially presented today for  a passing out, presyncopal type spell and weakness and then was admitted  following that felt secondary to seizures.  Do not believe at this time it  is related to seizures but does have a history of seizures as a child.   ASSESSMENT AND PLAN:  1.  Presyncope/question of seizure activity: At this time given his      presentation, it is hard to say whether this is related to seizure     activity or not.  Certainly his  weakness and passing-out-type spell      could easily have been vasovagal given the site of blood which brought      on both these events.  Initially placed him on seizure precautions and      will just keep on this evening and get Ativan p.r.n. for seizure      activity, but we  will monitor through the night.  CT scan of head did      not show any intracranial abnormalities, but given his symptoms and his      complex neurologic history, it may be beneficial getting a neurology      consult for further assessment.  2.  Diabetes mellitus, type 2: Blood sugars are mildly up on initial      studies.  Will check routinely and start on sliding scale insulin and      will hold metformin XR which may need to be increased to 1000 mg upon      discharge.  3.  Hyponatremia: The patient was on increased dose of Maxzide 37.5/25 and      was taken twice daily and was likely contributing to his hyponatremia.      I do not feel this is significant enough to cause his dysfunction.  4.  Right leg contusion.  It is not clear exactly what caused this.  He did      have a punctate which was bleeding earlier and felt to be bleeding      varicose vein.  We will check a PT/INR in the morning to see if there is      any further risk of bleeding, but patient is hemodynamically stable at      this time.  5.  Foley trauma: Will continue to monitor and make sure he is having normal      output.  Likely the trauma will take care of itself, and we will just      monitor to see if he will have occlusion from any blood clots if      significant enough.  6.  Hypertension: The patient's blood pressure is elevated at this time, but      also is very anxious.  Will continue on Maxzide low dose at this time      and will monitor daily for blood pressure changes.  7.  Hypercholesterolemia:  It has been sometime since this has been checked.      He is on Lipitor 20 mg.  Will continue at that daily and recheck fasting       lipid panel in the morning for risk stratification purposes.      Catalina Pizza, M.D.  Electronically Signed     ZH/MEDQ  D:  01/16/2005  T:  01/16/2005  Job:  [id number]   cc:   Angus G. Renard Matter, MD  Fax: [phone]

## 2010-09-01 NOTE — Op Note (Signed)
NAMEADIR, SCHICKER                         ACCOUNT NO.:  [id number]   MEDICAL RECORD NO.:  [id number]                   PATIENT TYPE:  AMB   LOCATION:  DAY                                  FACILITY:  [facility]   PHYSICIAN:  Lionel December, M.D.                 DATE OF BIRTH:  09/05/1950   DATE OF PROCEDURE:  03/27/2002  DATE OF DISCHARGE:                                 OPERATIVE REPORT   PROCEDURE:  Total colonoscopy.   INDICATIONS:  The patient is a 60 year old African-American male with mental  retardation, with a history of rectal bleeding.  He is undergoing diagnostic  colonoscopy.  As best as could be determined, he has not had any abdominal  pain or change in his bowel habits.  Family history is negative for  colorectal carcinoma.  Informed consent for the procedure was obtained from  his aunt, Ms. Ferdinand Lango.  He actually also initialed the consent  form.   PREMEDICATION:  Demerol 25 mg IV, Versed 3 mg IV in divided dose.   INSTRUMENT USED:  Olympus video system.   FINDINGS:  Procedure performed in endoscopy suite.  The patient's vital  signs and O2 saturation were monitored during procedure and remained stable.  The patient was placed in the left lateral recumbent position and rectal  examination was performed.  No abnormality noted on external or digital  exam.  The scope was placed in the rectum, where the mucosa was noted to be  somewhat erythematous with loss of vascularity.  There was no frank  ulceration or erosions.  These changes also involved the rectosigmoid.  However, mucosa of most of the sigmoid colon revealed normal vascular  pattern.  Preparation was excellent.  The scope was passed to the cecum,  which was identified by ileocecal valve and appendiceal orifice.  Pictures  taken for the record.  As the scope was withdrawn, mucosa was once again  carefully examined and was normal except at rectosigmoid junction and  rectum, where there was loss of  vascularity, granularity, and mucosal  erythema.  Biopsies were taken.  While in the rectum the scope was  retroflexed to examine the anorectal junction.  Small hemorrhoids were noted  above the dentate line.  The endoscope was straightened and withdrawn.  The  patient tolerated the procedure well.   FINAL DIAGNOSES:  1. Examination performed to cecum.  2. Mild changes of proctitis.  3. Small internal hemorrhoids.   Either of these two findings could explain his bleeding.    RECOMMENDATIONS:  1. Citrucel one tablespoonful daily.  2. Canasa suppository one per rectum at bedtime for one month.   I will be contacting the patient's aunt with biopsy results and further  recommendations.  Lionel December, M.D.    NR/MEDQ  D:  03/27/2002  T:  03/27/2002  Job:  [id number]   cc:   Angus G. Renard Matter, M.D.  [address]  [place]  [place] [address]  Fax: [phone]

## 2011-12-12 ENCOUNTER — Other Ambulatory Visit (HOSPITAL_COMMUNITY): Payer: Self-pay | Admitting: Urology

## 2011-12-12 DIAGNOSIS — R319 Hematuria, unspecified: Secondary | ICD-10-CM

## 2011-12-12 DIAGNOSIS — N200 Calculus of kidney: Secondary | ICD-10-CM

## 2011-12-19 ENCOUNTER — Ambulatory Visit (HOSPITAL_COMMUNITY)
Admission: RE | Admit: 2011-12-19 | Discharge: 2011-12-19 | Disposition: A | Discharge status: Home / Self Care | Payer: Medicaid Other | Source: Ambulatory Visit | Attending: Urology | Admitting: Urology

## 2011-12-19 DIAGNOSIS — N200 Calculus of kidney: Secondary | ICD-10-CM | POA: Insufficient documentation

## 2011-12-19 DIAGNOSIS — N323 Diverticulum of bladder: Secondary | ICD-10-CM | POA: Insufficient documentation

## 2011-12-19 DIAGNOSIS — R319 Hematuria, unspecified: Secondary | ICD-10-CM | POA: Insufficient documentation

## 2012-01-14 DIAGNOSIS — N200 Calculus of kidney: Secondary | ICD-10-CM | POA: Insufficient documentation

## 2013-10-27 ENCOUNTER — Encounter (HOSPITAL_COMMUNITY): Payer: Self-pay | Admitting: Emergency Medicine

## 2013-10-27 ENCOUNTER — Emergency Department (HOSPITAL_COMMUNITY)
Admission: EM | Admit: 2013-10-27 | Discharge: 2013-10-27 | Disposition: A | Discharge status: Home / Self Care | Payer: Medicaid Other | Attending: Emergency Medicine | Admitting: Emergency Medicine

## 2013-10-27 DIAGNOSIS — Z7982 Long term (current) use of aspirin: Secondary | ICD-10-CM | POA: Diagnosis not present

## 2013-10-27 DIAGNOSIS — I10 Essential (primary) hypertension: Secondary | ICD-10-CM | POA: Insufficient documentation

## 2013-10-27 DIAGNOSIS — Z79899 Other long term (current) drug therapy: Secondary | ICD-10-CM | POA: Diagnosis not present

## 2013-10-27 DIAGNOSIS — N35919 Unspecified urethral stricture, male, unspecified site: Secondary | ICD-10-CM | POA: Diagnosis not present

## 2013-10-27 DIAGNOSIS — R319 Hematuria, unspecified: Secondary | ICD-10-CM | POA: Diagnosis present

## 2013-10-27 DIAGNOSIS — E119 Type 2 diabetes mellitus without complications: Secondary | ICD-10-CM | POA: Diagnosis not present

## 2013-10-27 HISTORY — DX: Type 2 diabetes mellitus without complications: E11.9

## 2013-10-27 HISTORY — DX: Retention of urine, unspecified: R33.9

## 2013-10-27 HISTORY — DX: Essential (primary) hypertension: I10

## 2013-10-27 NOTE — ED Provider Notes (Signed)
CSN: [id number]     Arrival date & time 10/27/13  1531 History   First MD Initiated Contact with Patient 10/27/13 1547     No chief complaint on file.    (Consider location/radiation/quality/duration/timing/severity/associated sxs/prior Treatment) HPI Comments: Patient presents to the ER for evaluation after dislodging his Foley catheter. [facility] and had a procedure for urethral strictures. He had a catheter in place at discharge. Patient has MR, became agitated and pulled the catheter out upon arrival. He does not answer any questions appropriately. Information provided by his caregiver, sister.   Past Medical History  Diagnosis Date  . Diabetes mellitus without complication   . Hypertension   . Urinary retention    History reviewed. No pertinent past surgical history. No family history on file. History  Substance Use Topics  . Smoking status: Never Smoker   . Smokeless tobacco: Not on file  . Alcohol Use: No    Review of Systems  Genitourinary: Negative for penile swelling.  All other systems reviewed and are negative.     Allergies  Review of patient's allergies indicates no known allergies.  Home Medications   Prior to Admission medications   Medication Sig Start Date End Date Taking? Authorizing Provider  aspirin EC 81 MG tablet Take 81 mg by mouth daily.   Yes Historical Provider, MD  atorvastatin (LIPITOR) 20 MG tablet Take 20 mg by mouth daily.   Yes Historical Provider, MD  lisinopril (PRINIVIL,ZESTRIL) 10 MG tablet Take 10 mg by mouth daily.   Yes Historical Provider, MD  metFORMIN (GLUCOPHAGE) 500 MG tablet Take 1,000 mg by mouth daily.   Yes Historical Provider, MD  tamsulosin (FLOMAX) 0.4 MG CAPS capsule Take 0.4 mg by mouth daily.   Yes Historical Provider, MD   BP 135/61  Pulse 54  Temp(Src) 97.3 F (36.3 C) (Oral)  Resp 20  Ht 5\' 3"  (1.6 m)  Wt 166 lb (75.297 kg)  BMI 29.41 kg/m2  SpO2 100% Physical Exam   Constitutional: He is oriented to person, place, and time. He appears well-developed and well-nourished. No distress.  HENT:  Head: Normocephalic and atraumatic.  Right Ear: Hearing normal.  Left Ear: Hearing normal.  Nose: Nose normal.  Mouth/Throat: Oropharynx is clear and moist and mucous membranes are normal.  Eyes: Conjunctivae and EOM are normal. Pupils are equal, round, and reactive to light.  Neck: Normal range of motion. Neck supple.  Cardiovascular: Regular rhythm, S1 normal and S2 normal.  Exam reveals no gallop and no friction rub.   No murmur heard. Pulmonary/Chest: Effort normal and breath sounds normal. No respiratory distress. He exhibits no tenderness.  Abdominal: Soft. Normal appearance and bowel sounds are normal. There is no hepatosplenomegaly. There is no tenderness. There is no rebound, no guarding, no tenderness at McBurney's point and negative Murphy's sign. No hernia.  Musculoskeletal: Normal range of motion.  Neurological: He is alert and oriented to person, place, and time. He has normal strength. No cranial nerve deficit or sensory deficit. Coordination normal. GCS eye subscore is 4. GCS verbal subscore is 5. GCS motor subscore is 6.  Skin: Skin is warm, dry and intact. No rash noted. No cyanosis.  Psychiatric: He has a normal mood and affect. His speech is normal and behavior is normal. Thought content normal.    ED Course  Procedures (including critical care time) Labs Review Labs Reviewed - No data to display  Imaging Review No results found.   EKG  Interpretation None      MDM   Final diagnoses:  Urethral stricture unspecified  Hematuria    Patient presented after dislodging his Foley catheter. There was no gross bleeding noted on initial examination. Attempts to pass a 16 [place] catheter, however, were unsuccessful. It was noted that he had some blood and clots in the urethra with this attempt. Patient discussed with Doctor Benard Rink, on call  for urology at [facility] where he had his procedure earlier today. It was recommended that the patient be discharged, given a trial for spontaneous urination. Bladder scan showed only 80ml at arrival to the ER. It was recommended that if he does not pass any urine spontaneously in 6 hours, patient should be brought back to the ER at [facility] for further evaluation. Patient's caregivers were instructed on this and agree with the plan.  Note: we do not have Urology at present at [facility]   Gilda Crease, MD 10/27/13 [phone]

## 2013-10-27 NOTE — ED Notes (Signed)
Per aunt, [facility]. States pt pulled catheter iout on the way home. States she was told to bring pt here for evaluation

## 2013-10-27 NOTE — Discharge Instructions (Signed)
If the patient does not urinate in the next 6 hours, bring him to the emergency room at [facility].  Hematuria, Adult Hematuria is blood in your urine. It can be caused by a bladder infection, kidney infection, prostate infection, kidney stone, or cancer of your urinary tract. Infections can usually be treated with medicine, and a kidney stone usually will pass through your urine. If neither of these is the cause of your hematuria, further workup to find out the reason may be needed. It is very important that you tell your health care provider about any blood you see in your urine, even if the blood stops without treatment or happens without causing pain. Blood in your urine that happens and then stops and then happens again can be a symptom of a very serious condition. Also, pain is not a symptom in the initial stages of many urinary cancers. HOME CARE INSTRUCTIONS   Drink lots of fluid, 3-4 quarts a day. If you have been diagnosed with an infection, cranberry juice is especially recommended, in addition to large amounts of water.  Avoid caffeine, tea, and carbonated beverages, because they tend to irritate the bladder.  Avoid alcohol because it may irritate the prostate.  Only take over-the-counter or prescription medicines for pain, discomfort, or fever as directed by your health care provider.  If you have been diagnosed with a kidney stone, follow your health care provider's instructions regarding straining your urine to catch the stone.  Empty your bladder often. Avoid holding urine for long periods of time.  After a bowel movement, women should cleanse front to back. Use each tissue only once.  Empty your bladder before and after sexual intercourse if you are a male. SEEK MEDICAL CARE IF: You develop back pain, fever, a feeling of sickness in your stomach (nausea), or vomiting or if your symptoms are not better in 3 days. Return sooner if you are getting worse. SEEK IMMEDIATE  MEDICAL CARE IF:   You have a persistent fever, with a temperature of 101.41F (38.8C) or greater.  You develop severe vomiting and are unable to keep the medicine down.  You develop severe back or abdominal pain despite taking your medicines.  You begin passing a large amount of blood or clots in your urine.  You feel extremely weak or faint, or you pass out. MAKE SURE YOU:   Understand these instructions.  Will watch your condition.  Will get help right away if you are not doing well or get worse. Document Released: 04/02/2005 Document Revised: 01/21/2013 Document Reviewed: 12/01/2012 [facility] Patient Information 2015 [place], [place]. This information is not intended to replace advice given to you by your health care provider. Make sure you discuss any questions you have with your health care provider. Acute Urinary Retention Acute urinary retention is the temporary inability to urinate. This is a common problem in older men. As men age their prostates become larger and block the flow of urine from the bladder. This is usually a problem that has come on gradually.  HOME CARE INSTRUCTIONS If you are sent home with a Foley catheter and a drainage system, you will need to discuss the best course of action with your health care provider. While the catheter is in, maintain a good intake of fluids. Keep the drainage bag emptied and lower than your catheter. This is so that contaminated urine will not flow back into your bladder, which could lead to a urinary tract infection. There are two main types of  drainage bags. One is a large bag that usually is used at night. It has a good capacity that will allow you to sleep through the night without having to empty it. The second type is called a leg bag. It has a smaller capacity, so it needs to be emptied more frequently. However, the main advantage is that it can be attached by a leg strap and can go underneath your clothing, allowing you the  freedom to move about or leave your home. Only take over-the-counter or prescription medicines for pain, discomfort, or fever as directed by your health care provider.  SEEK MEDICAL CARE IF:  You develop a low-grade fever.  You experience spasms or leakage of urine with the spasms. SEEK IMMEDIATE MEDICAL CARE IF:   You develop chills or fever.  Your catheter stops draining urine.  Your catheter falls out.  You start to develop increased bleeding that does not respond to rest and increased fluid intake. MAKE SURE YOU:  Understand these instructions.  Will watch your condition.  Will get help right away if you are not doing well or get worse. Document Released: 07/09/2000 Document Revised: 04/07/2013 Document Reviewed: 09/11/2012 [facility] Patient Information 2015 [place], [place]. This information is not intended to replace advice given to you by your health care provider. Make sure you discuss any questions you have with your health care provider.

## 2013-10-27 NOTE — ED Notes (Signed)
NAD noted at time of d/c instruction given by Dr.Pollina.

## 2014-05-07 ENCOUNTER — Inpatient Hospital Stay (HOSPITAL_COMMUNITY)
Admission: EM | Admit: 2014-05-07 | Discharge: 2014-05-11 | DRG: 726 | Disposition: A | Discharge status: Home Health | Payer: Medicaid Other | Attending: Family Medicine | Admitting: Family Medicine

## 2014-05-07 ENCOUNTER — Emergency Department (HOSPITAL_COMMUNITY): Payer: Medicaid Other

## 2014-05-07 ENCOUNTER — Encounter (HOSPITAL_COMMUNITY): Payer: Self-pay | Admitting: *Deleted

## 2014-05-07 DIAGNOSIS — N401 Enlarged prostate with lower urinary tract symptoms: Principal | ICD-10-CM | POA: Diagnosis present

## 2014-05-07 DIAGNOSIS — R339 Retention of urine, unspecified: Secondary | ICD-10-CM

## 2014-05-07 DIAGNOSIS — R338 Other retention of urine: Secondary | ICD-10-CM | POA: Diagnosis present

## 2014-05-07 DIAGNOSIS — K598 Other specified functional intestinal disorders: Secondary | ICD-10-CM

## 2014-05-07 DIAGNOSIS — I10 Essential (primary) hypertension: Secondary | ICD-10-CM | POA: Diagnosis present

## 2014-05-07 DIAGNOSIS — E119 Type 2 diabetes mellitus without complications: Secondary | ICD-10-CM | POA: Diagnosis present

## 2014-05-07 DIAGNOSIS — K5981 Ogilvie syndrome: Secondary | ICD-10-CM

## 2014-05-07 DIAGNOSIS — K3189 Other diseases of stomach and duodenum: Secondary | ICD-10-CM | POA: Diagnosis present

## 2014-05-07 DIAGNOSIS — E785 Hyperlipidemia, unspecified: Secondary | ICD-10-CM | POA: Diagnosis present

## 2014-05-07 DIAGNOSIS — K5939 Other megacolon: Secondary | ICD-10-CM | POA: Diagnosis present

## 2014-05-07 DIAGNOSIS — K6389 Other specified diseases of intestine: Secondary | ICD-10-CM | POA: Diagnosis present

## 2014-05-07 DIAGNOSIS — R109 Unspecified abdominal pain: Secondary | ICD-10-CM

## 2014-05-07 DIAGNOSIS — F79 Unspecified intellectual disabilities: Secondary | ICD-10-CM | POA: Diagnosis present

## 2014-05-07 DIAGNOSIS — K599 Functional intestinal disorder, unspecified: Secondary | ICD-10-CM

## 2014-05-07 LAB — COMPREHENSIVE METABOLIC PANEL
ALK PHOS: 55 U/L (ref 39–117)
ALT: 20 U/L (ref 0–53)
ANION GAP: 9 (ref 5–15)
AST: 23 U/L (ref 0–37)
Albumin: 4.3 g/dL (ref 3.5–5.2)
BUN: 26 mg/dL — ABNORMAL HIGH (ref 6–23)
CALCIUM: 9.7 mg/dL (ref 8.4–10.5)
CO2: 22 mmol/L (ref 19–32)
CREATININE: 0.9 mg/dL (ref 0.50–1.35)
Chloride: 103 mmol/L (ref 96–112)
GFR, EST NON AFRICAN AMERICAN: 88 mL/min — AB (ref >90)
GLUCOSE: 184 mg/dL — AB (ref 70–99)
POTASSIUM: 4 mmol/L (ref 3.5–5.1)
SODIUM: 134 mmol/L — AB (ref 135–145)
TOTAL PROTEIN: 7.8 g/dL (ref 6.0–8.3)
Total Bilirubin: 1 mg/dL (ref 0.3–1.2)

## 2014-05-07 LAB — CBG MONITORING, ED: Glucose-Capillary: 165 mg/dL — ABNORMAL HIGH (ref 70–99)

## 2014-05-07 LAB — CBC WITH DIFFERENTIAL/PLATELET
BASOS ABS: 0 10*3/uL (ref 0.0–0.1)
Basophils Relative: 0 % (ref 0–1)
Eosinophils Absolute: 0 10*3/uL (ref 0.0–0.7)
Eosinophils Relative: 0 % (ref 0–5)
HEMATOCRIT: 37.7 % — AB (ref 39.0–52.0)
Hemoglobin: 12.2 g/dL — ABNORMAL LOW (ref 13.0–17.0)
LYMPHS ABS: 0.9 10*3/uL (ref 0.7–4.0)
LYMPHS PCT: 5 % — AB (ref 12–46)
MCH: 29.7 pg (ref 26.0–34.0)
MCHC: 32.4 g/dL (ref 30.0–36.0)
MCV: 91.7 fL (ref 78.0–100.0)
MONO ABS: 0.9 10*3/uL (ref 0.1–1.0)
MONOS PCT: 5 % (ref 3–12)
NEUTROS ABS: 15.1 10*3/uL — AB (ref 1.7–7.7)
NEUTROS PCT: 90 % — AB (ref 43–77)
PLATELETS: 129 10*3/uL — AB (ref 150–400)
RBC: 4.11 MIL/uL — ABNORMAL LOW (ref 4.22–5.81)
RDW: 13.6 % (ref 11.5–15.5)
WBC: 16.9 10*3/uL — AB (ref 4.0–10.5)

## 2014-05-07 LAB — LIPASE, BLOOD: LIPASE: 26 U/L (ref 11–59)

## 2014-05-07 MED ORDER — SODIUM CHLORIDE 0.9 % IV SOLN
1000.0000 mL | Freq: Once | INTRAVENOUS | Status: AC
  Administered 2014-05-07: 1000 mL via INTRAVENOUS

## 2014-05-07 MED ORDER — HEPARIN SODIUM (PORCINE) 5000 UNIT/ML IJ SOLN
5000.0000 [IU] | Freq: Three times a day (TID) | INTRAMUSCULAR | Status: DC
  Administered 2014-05-07 – 2014-05-11 (×11): 5000 [IU] via SUBCUTANEOUS
  Filled 2014-05-07 (×11): qty 1

## 2014-05-07 MED ORDER — DEXTROSE-NACL 5-0.9 % IV SOLN
INTRAVENOUS | Status: DC
  Administered 2014-05-07 – 2014-05-10 (×6): via INTRAVENOUS

## 2014-05-07 MED ORDER — ONDANSETRON HCL 4 MG/2ML IJ SOLN
4.0000 mg | Freq: Once | INTRAMUSCULAR | Status: AC
  Administered 2014-05-07: 4 mg via INTRAVENOUS
  Filled 2014-05-07: qty 2

## 2014-05-07 MED ORDER — IOHEXOL 300 MG/ML  SOLN
25.0000 mL | Freq: Once | INTRAMUSCULAR | Status: AC | PRN
  Administered 2014-05-07: 25 mL via ORAL

## 2014-05-07 MED ORDER — TAMSULOSIN HCL 0.4 MG PO CAPS
0.4000 mg | ORAL_CAPSULE | Freq: Every day | ORAL | Status: DC
  Administered 2014-05-09 – 2014-05-11 (×3): 0.4 mg via ORAL
  Filled 2014-05-07 (×3): qty 1

## 2014-05-07 MED ORDER — ONDANSETRON HCL 4 MG/2ML IJ SOLN: 4.0000 mg | Freq: Four times a day (QID) | INTRAMUSCULAR | Status: DC | PRN

## 2014-05-07 MED ORDER — LISINOPRIL 10 MG PO TABS
10.0000 mg | ORAL_TABLET | Freq: Every day | ORAL | Status: DC
Start: 2014-05-08 — End: 2014-05-11
  Administered 2014-05-09 – 2014-05-11 (×3): 10 mg via ORAL
  Filled 2014-05-07 (×3): qty 1

## 2014-05-07 MED ORDER — ASPIRIN EC 81 MG PO TBEC
81.0000 mg | DELAYED_RELEASE_TABLET | Freq: Every morning | ORAL | Status: DC
  Administered 2014-05-09 – 2014-05-11 (×3): 81 mg via ORAL
  Filled 2014-05-07 (×3): qty 1

## 2014-05-07 MED ORDER — ATORVASTATIN CALCIUM 20 MG PO TABS
20.0000 mg | ORAL_TABLET | Freq: Every evening | ORAL | Status: DC
  Administered 2014-05-08 – 2014-05-10 (×3): 20 mg via ORAL
  Filled 2014-05-07 (×3): qty 1

## 2014-05-07 MED ORDER — SODIUM CHLORIDE 0.9 % IV SOLN
1000.0000 mL | INTRAVENOUS | Status: DC
  Administered 2014-05-07: 1000 mL via INTRAVENOUS

## 2014-05-07 MED ORDER — INSULIN ASPART 100 UNIT/ML ~~LOC~~ SOLN
0.0000 [IU] | SUBCUTANEOUS | Status: DC
Start: 2014-05-08 — End: 2014-05-11
  Administered 2014-05-08 – 2014-05-09 (×4): 2 [IU] via SUBCUTANEOUS
  Administered 2014-05-09: 1 [IU] via SUBCUTANEOUS
  Administered 2014-05-09 (×2): 2 [IU] via SUBCUTANEOUS
  Administered 2014-05-09 – 2014-05-10 (×3): 1 [IU] via SUBCUTANEOUS
  Administered 2014-05-10: 2 [IU] via SUBCUTANEOUS
  Administered 2014-05-10: 1 [IU] via SUBCUTANEOUS
  Administered 2014-05-10: 2 [IU] via SUBCUTANEOUS
  Administered 2014-05-11: 1 [IU] via SUBCUTANEOUS
  Administered 2014-05-11: 2 [IU] via SUBCUTANEOUS
  Administered 2014-05-11: 1 [IU] via SUBCUTANEOUS
  Administered 2014-05-11: 2 [IU] via SUBCUTANEOUS

## 2014-05-07 MED ORDER — MORPHINE SULFATE 4 MG/ML IJ SOLN
4.0000 mg | Freq: Once | INTRAMUSCULAR | Status: AC
  Administered 2014-05-07: 4 mg via INTRAVENOUS
  Filled 2014-05-07: qty 1

## 2014-05-07 MED ORDER — IOHEXOL 300 MG/ML  SOLN
100.0000 mL | Freq: Once | INTRAMUSCULAR | Status: AC | PRN
  Administered 2014-05-07: 100 mL via INTRAVENOUS

## 2014-05-07 MED ORDER — ONDANSETRON HCL 4 MG PO TABS: 4.0000 mg | ORAL_TABLET | Freq: Four times a day (QID) | ORAL | Status: DC | PRN

## 2014-05-07 NOTE — ED Notes (Signed)
[place], per pt's family co abdominal pain/bloating, pt unable to speak, unable to provide history but does point and nod for questions.

## 2014-05-07 NOTE — ED Notes (Signed)
In and out catheter placed by ED Tech. Patient tolerated well 1000cc out put. Also placed 1813f NG tube into left nare, patient tolerating well.

## 2014-05-07 NOTE — ED Notes (Signed)
PT points to abdomen when asked if in pain. PT able to answer yes and no questions and follows commands.

## 2014-05-07 NOTE — ED Notes (Signed)
Went into patients room to place foley catheter. Patients family refused placement of foley catheter stating "hes going to pull it out" "dont put it in"

## 2014-05-07 NOTE — H&P (Signed)
[facility] History and Physical  [name] ZOX:[id number] DOB: 03/03/1951    PCP:   Alice Reichert, MD   Chief Complaint: distended abdomen.  HPI: [name] is an 64 y.o. male with hx of MR, nonverbal since birth, HTN, HLD, urinary retention with planned TURP at [facility] hospital as he has tendency to yank his foley with balloon inflated, hx of intermittent abdominal distention, but tonight, it was severe and he has abdominal tenderness.  Evaluation in the ER included an I/O, relieving about 900 cc of urine, a abdominal pelvic CT showed distended stomach, small bowel and colon, with no transition point and no mass, suggestive of ileus, prostate with seed implants.  His WBC is elevated to 16K, Hb, renal fx test, electrolytes were normal.  An NGT was placed and he felt better.  Hospitalist was asked to admit him for pseudo obstruction and acute on chronic urinary retention.   Rewiew of Systems:  Unable.   Past Medical History  Diagnosis Date  . Diabetes mellitus without complication   . Hypertension   . Urinary retention     History reviewed. No pertinent past surgical history.  Medications:  HOME MEDS: Prior to Admission medications   Medication Sig Start Date End Date Taking? Authorizing Provider  aspirin EC 81 MG tablet Take 81 mg by mouth every morning.    Yes Historical Provider, MD  atorvastatin (LIPITOR) 20 MG tablet Take 20 mg by mouth every evening.    Yes Historical Provider, MD  metFORMIN (GLUCOPHAGE-XR) 500 MG 24 hr tablet Take 2 tablets by mouth every morning.  04/22/14  Yes Historical Provider, MD  lisinopril (PRINIVIL,ZESTRIL) 10 MG tablet Take 10 mg by mouth daily.    Historical Provider, MD  tamsulosin (FLOMAX) 0.4 MG CAPS capsule Take 0.4 mg by mouth daily.    Historical Provider, MD     Allergies:  No Known Allergies  Social History:   reports that he has never smoked. He does not have any smokeless tobacco history on file. He reports that he  does not drink alcohol or use illicit drugs.  Family History: History reviewed. No pertinent family history.   Physical Exam: Filed Vitals:   05/07/14 1822 05/07/14 1830 05/07/14 1900 05/07/14 2043  BP: 114/65 115/55 126/71 114/75  Pulse: 98 96  96  Temp:    98.4 F (36.9 C)  TempSrc:    Oral  Resp: Height:      Weight:      SpO2: 95% 92%  95%   Blood pressure 114/75, pulse 96, temperature 98.4 F (36.9 C), temperature source Oral, resp. rate 18, height  (1.676 m), weight 74.844 kg (165 lb), SpO2 95 %.  GEN:  Pleasant  patient lying in the stretcher in no acute distress; cooperative with exam. PSYCH:  alert and oriented x4; does not appear anxious or depressed; affect is appropriate. HEENT: Mucous membranes pink and anicteric; PERRLA; EOM intact; no cervical lymphadenopathy nor thyromegaly or carotid bruit; no JVD; There were no stridor. Neck is very supple. Breasts:: Not examined CHEST WALL: No tenderness CHEST: Normal respiration, clear to auscultation bilaterally.  HEART: Regular rate and rhythm.  There are no murmur, rub, or gallops.   BACK: No kyphosis or scoliosis; no CVA tenderness ABDOMEN: distended and hypertympanic, non-tender; no masses, no organomegaly, normal abdominal bowel sounds; no pannus; no intertriginous candida. There is no rebound and no distention. Rectal Exam: Not done EXTREMITIES: No bone or joint deformity;  age-appropriate arthropathy of the hands and knees; no edema; no ulcerations.  There is no calf tenderness. Genitalia: not examined PULSES: 2+ and symmetric SKIN: Normal hydration no rash or ulceration CNS: Cranial nerves 2-12 grossly intact no focal lateralizing neurologic deficit.  Speech is fluent; uvula elevated with phonation, facial symmetry and tongue midline. DTR are normal bilaterally, cerebella exam is intact, barbinski is negative and strengths are equaled bilaterally.  No sensory loss.   Labs on Admission:  Basic  Metabolic Panel:  Recent Labs Lab 05/07/14 1733  NA 134*  K 4.0  CL 103  CO2 22  GLUCOSE 184*  BUN 26*  CREATININE 0.90  CALCIUM 9.7   Liver Function Tests:  Recent Labs Lab 05/07/14 1733  AST 23  ALT 20  ALKPHOS 55  BILITOT 1.0  PROT 7.8  ALBUMIN 4.3    Recent Labs Lab 05/07/14 1733  LIPASE 26   No results for input(s): AMMONIA in the last 168 hours. CBC:  Recent Labs Lab 05/07/14 1733  WBC 16.9*  NEUTROABS 15.1*  HGB 12.2*  HCT 37.7*  MCV 91.7  PLT 129*   Cardiac Enzymes: No results for input(s): CKTOTAL, CKMB, CKMBINDEX, TROPONINI in the last 168 hours.  CBG:  Recent Labs Lab 05/07/14 1722  GLUCAP 165*     Radiological Exams on Admission: Ct Abdomen Pelvis W Contrast  05/07/2014   CLINICAL DATA:  Abdominal pain and nausea  EXAM: CT ABDOMEN AND PELVIS WITH CONTRAST  TECHNIQUE: Multidetector CT imaging of the abdomen and pelvis was performed using the standard protocol following bolus administration of intravenous contrast.  CONTRAST:  25mL OMNIPAQUE IOHEXOL 300 MG/ML SOLN, OMNIPAQUE IOHEXOL 300 MG/ML SOLN  COMPARISON:  12/18/2012 and abdominal x-ray same day  FINDINGS: Lung bases shows streaky atelectasis or infiltrate right lower lobe posteriorly. There is streaky atelectasis left base posteriorly. Sagittal images of the spine are unremarkable. Minimal compression deformity T12 vertebral body is stable.  There is significant gastric distension with gas and fluid suspicious for gastroparesis. There is no evidence of gastric outlet obstruction. Multiple layering calcified gallstones are noted within gallbladder. No pericholecystic fluid is noted.  There is small hiatal hernia containing contrast material.  The pancreas, spleen and adrenal glands are unremarkable. Kidneys are symmetrical in size and enhancement. Nonobstructive calcified calculus in lower pole of the right kidney measures 4.8 mm. Staghorn calculus lower pole of the left kidney measures  1.3 cm. At least 3 nonobstructive calcified calculi are noted in midpole of the left kidney the largest measures 1.1 cm. Additional calculi in left kidney measures 7.4 mm.  No calcified ureteral calculi are noted bilaterally. No hydronephrosis or hydroureter. There are gaseous distended small bowel loops in lower abdomen without transition point in caliber highly suspicious for significant small bowel ileus.  There is significant gaseous distension of the colon. There is fluid in cecum and moderate gas in ascending colon. Significant gaseous distended and tortuous transverse colon. No definite obstructive colonic mass is noted. Mild focal thickening in hepatic flexure of the colon probable due to spasm in axial image 32. There is no evidence of cecal volvulus.  No definite obstructing mass is noted in distal colon. There is some stool within rectum. Some stool is noted in distal sigmoid colon. The sigmoid colon is also redundant with with some stool see axial image 55. There is gaseous distension of proximal sigmoid colon. There is some stool in descending colon. No definite colonic volvulus or colonic mass is noted.  Radiation  seeds are noted in prostate gland region. The seminal vesicles are unremarkable. There is markedly distended trabeculated urinary bladder again noted. There is a small superior anterior urinary bladder diverticulum measures 3.1 cm. There is no abdominal ascites. No free abdominal air.  IMPRESSION: 1. There is right base posterior atelectasis or infiltrate. Left base posterior atelectasis is noted. 2. Significant gastric distension with air and fluid highly suspicious for gastroparesis. There is significant gaseous distension especially in transverse colon and some loop of sigmoid colon. Significant redundant transverse colon. Distended distal small bowel loops are noted with gas. Findings are most likely due to significant colonic and small bowel ileus. No definite transition point is small  bowel caliber to suggest small bowel obstruction. No definite obstructive colonic mass is noted. There is some stool in descending colon and distal sigmoid colon and rectum. No colonic volvulus is identified. 3. Radiation seeds are noted in prostate gland region. 4. There is trabeculated significant distended urinary bladder. There is a urinary bladder diverticulum anterior superior aspect of the bladder measures 3.1 cm These results were called by telephone at the time of interpretation on 05/07/2014 at 7:49 pm to Dr. Azalia BilisKEVIN CAMPOS , who verbally acknowledged these results.   Electronically Signed   By: Natasha MeadLiviu  Pop M.D.   On: 05/07/2014 19:50   Dg Abd Acute W/chest  05/07/2014   CLINICAL DATA:  Distended abdomen.  Blood in the stool today  EXAM: ACUTE ABDOMEN SERIES (ABDOMEN 2 VIEW & CHEST 1 VIEW)  COMPARISON:  None.  FINDINGS: There is marked air-filled distended colon, greatest diameter 9.6 cm in the transverse colon. There is relative lack of air in the bowel loops in the lower pelvis. The heart size is enlarged. The aorta is tortuous. There is atelectasis of both lung bases.  IMPRESSION: Marked air-filled distended colon greatest diameter 9.6 cm in the transverse colon. There is relative lack of air and the bowel loops of the lower pelvis. Distal colonic obstruction is not excluded. There is lack of bowel gas in the lower pelvis, pelvic mass is not excluded. Recommend further evaluation with CT abdomen and pelvis.   Electronically Signed   By: Sherian ReinWei-Chen  Lin M.D.   On: 05/07/2014 18:13   Assessment/Plan Present on Admission:  . Pseudo-obstruction of colon . HTN (hypertension) HTN HLD  PLAN: Will continue with NGT, repeat (ordered) another KUB in the am and reassess.  He is NPO and was given IVF.  For his urinary retention, may need another I and O cath in the am if he can't urinate.  At some point, [facility]  Unlikely he will need urgent surgery at this point.  Would  consider starting him on Erythromycin for to increase GI activity once he is able to take oral meds.  Will use IV Meds for BP until he is able to take oral.  He is a full code and will be admitted to hospitalist service.   Other plans as per orders.  Code Status: FULL CODE (confirmed tonight--His aunt is HCP)   Houston SirenLE,Lynea Rollison, MD. [facility] Pager [phone] 7pm to 7am.  05/07/2014, 9:41 PM

## 2014-05-07 NOTE — ED Provider Notes (Signed)
CSN: [id number]     Arrival date & time 05/07/14  1712 History   First MD Initiated Contact with Patient 05/07/14 1716     Chief Complaint  Patient presents with  . Dysphagia  . Abdominal Pain    L5 caveat: The retardation  History obtained from prior records and his aunt  The history is provided by the patient.   Patient is brought to the emergency department for complaints of abdominal pain as well as nausea.  Family does not know if he had any vomiting.  It's reported that he was able to eat and drink some tonight.  Patient is being followed at [facility] for chronic urinary retention secondary to prostatic hypertrophy. He is followed by Dr Gala Lewandowsky, urology.  No reports of fevers or chills.  No reports of altered mental status.  He does not speak baseline.  He has mental retardation.   Past Medical History  Diagnosis Date  . Diabetes mellitus without complication   . Hypertension   . Urinary retention    History reviewed. No pertinent past surgical history. History reviewed. No pertinent family history. History  Substance Use Topics  . Smoking status: Never Smoker   . Smokeless tobacco: Not on file  . Alcohol Use: No    Review of Systems  Unable to perform ROS: Other  Gastrointestinal: Positive for abdominal pain.      Allergies  Review of patient's allergies indicates no known allergies.  Home Medications   Prior to Admission medications   Medication Sig Start Date End Date Taking? Authorizing Provider  aspirin EC 81 MG tablet Take 81 mg by mouth every morning.    Yes Historical Provider, MD  atorvastatin (LIPITOR) 20 MG tablet Take 20 mg by mouth every evening.    Yes Historical Provider, MD  metFORMIN (GLUCOPHAGE-XR) 500 MG 24 hr tablet Take 2 tablets by mouth every morning.  04/22/14  Yes Historical Provider, MD  lisinopril (PRINIVIL,ZESTRIL) 10 MG tablet Take 10 mg by mouth daily.    Historical Provider, MD  tamsulosin (FLOMAX) 0.4 MG CAPS capsule Take 0.4 mg  by mouth daily.    Historical Provider, MD   BP 114/75 mmHg  Pulse 96  Temp(Src) 98.4 F (36.9 C) (Oral)  Resp 18  Ht  (1.676 m)  Wt 165 lb (74.844 kg)  BMI 26.64 kg/m2  SpO2 95% Physical Exam  Constitutional: He appears well-developed and well-nourished.  HENT:  Head: Normocephalic and atraumatic.  Eyes: EOM are normal.  Neck: Normal range of motion.  Cardiovascular: Normal rate, regular rhythm, normal heart sounds and intact distal pulses.   Pulmonary/Chest: Effort normal and breath sounds normal. No respiratory distress.  Abdominal: Soft.  Large distended abdomen without significant tenderness.  No peritoneal signs  Musculoskeletal: Normal range of motion.  Neurological: He is alert.  Follow simple commands. moves all 4 extremities  Skin: Skin is warm and dry.  Psychiatric: He has a normal mood and affect. Judgment normal.  Nursing note and vitals reviewed.   ED Course  Procedures (including critical care time) Labs Review Labs Reviewed  CBC WITH DIFFERENTIAL/PLATELET - Abnormal; Notable for the following:    WBC 16.9 (*)    RBC 4.11 (*)    Hemoglobin 12.2 (*)    HCT 37.7 (*)    Platelets 129 (*)    Neutrophils Relative % 90 (*)    Neutro Abs 15.1 (*)    Lymphocytes Relative 5 (*)    All other components within normal  limits  COMPREHENSIVE METABOLIC PANEL - Abnormal; Notable for the following:    Sodium 134 (*)    Glucose, Bld 184 (*)    BUN 26 (*)    GFR calc non Af Amer 88 (*)    All other components within normal limits  CBG MONITORING, ED - Abnormal; Notable for the following:    Glucose-Capillary 165 (*)    All other components within normal limits  LIPASE, BLOOD    Imaging Review Ct Abdomen Pelvis W Contrast  05/07/2014   CLINICAL DATA:  Abdominal pain and nausea  EXAM: CT ABDOMEN AND PELVIS WITH CONTRAST  TECHNIQUE: Multidetector CT imaging of the abdomen and pelvis was performed using the standard protocol following bolus administration of  intravenous contrast.  CONTRAST:  25mL OMNIPAQUE IOHEXOL 300 MG/ML SOLN, OMNIPAQUE IOHEXOL 300 MG/ML SOLN  COMPARISON:  12/18/2012 and abdominal x-ray same day  FINDINGS: Lung bases shows streaky atelectasis or infiltrate right lower lobe posteriorly. There is streaky atelectasis left base posteriorly. Sagittal images of the spine are unremarkable. Minimal compression deformity T12 vertebral body is stable.  There is significant gastric distension with gas and fluid suspicious for gastroparesis. There is no evidence of gastric outlet obstruction. Multiple layering calcified gallstones are noted within gallbladder. No pericholecystic fluid is noted.  There is small hiatal hernia containing contrast material.  The pancreas, spleen and adrenal glands are unremarkable. Kidneys are symmetrical in size and enhancement. Nonobstructive calcified calculus in lower pole of the right kidney measures 4.8 mm. Staghorn calculus lower pole of the left kidney measures 1.3 cm. At least 3 nonobstructive calcified calculi are noted in midpole of the left kidney the largest measures 1.1 cm. Additional calculi in left kidney measures 7.4 mm.  No calcified ureteral calculi are noted bilaterally. No hydronephrosis or hydroureter. There are gaseous distended small bowel loops in lower abdomen without transition point in caliber highly suspicious for significant small bowel ileus.  There is significant gaseous distension of the colon. There is fluid in cecum and moderate gas in ascending colon. Significant gaseous distended and tortuous transverse colon. No definite obstructive colonic mass is noted. Mild focal thickening in hepatic flexure of the colon probable due to spasm in axial image 32. There is no evidence of cecal volvulus.  No definite obstructing mass is noted in distal colon. There is some stool within rectum. Some stool is noted in distal sigmoid colon. The sigmoid colon is also redundant with with some stool see axial  image 55. There is gaseous distension of proximal sigmoid colon. There is some stool in descending colon. No definite colonic volvulus or colonic mass is noted.  Radiation seeds are noted in prostate gland region. The seminal vesicles are unremarkable. There is markedly distended trabeculated urinary bladder again noted. There is a small superior anterior urinary bladder diverticulum measures 3.1 cm. There is no abdominal ascites. No free abdominal air.  IMPRESSION: 1. There is right base posterior atelectasis or infiltrate. Left base posterior atelectasis is noted. 2. Significant gastric distension with air and fluid highly suspicious for gastroparesis. There is significant gaseous distension especially in transverse colon and some loop of sigmoid colon. Significant redundant transverse colon. Distended distal small bowel loops are noted with gas. Findings are most likely due to significant colonic and small bowel ileus. No definite transition point is small bowel caliber to suggest small bowel obstruction. No definite obstructive colonic mass is noted. There is some stool in descending colon and distal sigmoid colon and rectum. No  colonic volvulus is identified. 3. Radiation seeds are noted in prostate gland region. 4. There is trabeculated significant distended urinary bladder. There is a urinary bladder diverticulum anterior superior aspect of the bladder measures 3.1 cm These results were called by telephone at the time of interpretation on 05/07/2014 at 7:49 pm to Dr. Azalia BilisKEVIN Keilah Lemire , who verbally acknowledged these results.   Electronically Signed   By: Natasha MeadLiviu  Pop M.D.   On: 05/07/2014 19:50   Dg Abd Acute W/chest  05/07/2014   CLINICAL DATA:  Distended abdomen.  Blood in the stool today  EXAM: ACUTE ABDOMEN SERIES (ABDOMEN 2 VIEW & CHEST 1 VIEW)  COMPARISON:  None.  FINDINGS: There is marked air-filled distended colon, greatest diameter 9.6 cm in the transverse colon. There is relative lack of air in the  bowel loops in the lower pelvis. The heart size is enlarged. The aorta is tortuous. There is atelectasis of both lung bases.  IMPRESSION: Marked air-filled distended colon greatest diameter 9.6 cm in the transverse colon. There is relative lack of air and the bowel loops of the lower pelvis. Distal colonic obstruction is not excluded. There is lack of bowel gas in the lower pelvis, pelvic mass is not excluded. Recommend further evaluation with CT abdomen and pelvis.   Electronically Signed   By: Sherian ReinWei-Chen  Lin M.D.   On: 05/07/2014 18:13  I personally reviewed the imaging tests through PACS system I reviewed available ER/hospitalization records through the EMR    EKG Interpretation   Date/Time:  Friday May 07 2014 17:18:28 EST Ventricular Rate:  97 PR Interval:  169 QRS Duration: 97 QT Interval:  345 QTC Calculation: 438 R Axis:   19 Text Interpretation:  Sinus rhythm Probable left atrial enlargement RSR'  in V1 or V2, right VCD or RVH Borderline ST elevation, anterior leads  Baseline wander in lead(s) V4 No significant change was found Confirmed by  Radley Teston  MD, Blu Lori ([id number]) on 05/07/2014 6:16:23 PM      MDM   Final diagnoses:  Abdominal pain  Pseudo-obstruction of colon  Urinary retention    Patient with chronic urinary retention and followed at [facility].  Plan is for a TURP as soon as his urinary retention becomes an issue.  This appears to be sigmoid colonic obstruction from large urinary bladder.  Family is resistant to indwelling Foley catheter as well as his outpatient urologist as the patient is a history of mental retardation is pulled these out before in the past.  Bladder was drained completely by straight drain here and an NG tube placed.  I think the patient needs to be admitted overnight for NG decompression of his stomach, small bowel, and large bowel.  Patient's abdomen is already better and I think that his pseudo-colonic obstruction will improve now that his  bladder has been decompressed and he should be able to have have normal flatus    Lyanne CoKevin M Armon Orvis, MD 05/07/14 2102

## 2014-05-08 ENCOUNTER — Inpatient Hospital Stay (HOSPITAL_COMMUNITY): Payer: Medicaid Other

## 2014-05-08 LAB — BASIC METABOLIC PANEL
Anion gap: 3 — ABNORMAL LOW (ref 5–15)
BUN: 15 mg/dL (ref 6–23)
CALCIUM: 8.7 mg/dL (ref 8.4–10.5)
CO2: 27 mmol/L (ref 19–32)
CREATININE: 0.77 mg/dL (ref 0.50–1.35)
Chloride: 106 mmol/L (ref 96–112)
GFR calc Af Amer: >90 mL/min (ref >90)
Glucose, Bld: 149 mg/dL — ABNORMAL HIGH (ref 70–99)
Potassium: 4.3 mmol/L (ref 3.5–5.1)
Sodium: 136 mmol/L (ref 135–145)

## 2014-05-08 LAB — GLUCOSE, CAPILLARY
Glucose-Capillary: 115 mg/dL — ABNORMAL HIGH (ref 70–99)
Glucose-Capillary: 144 mg/dL — ABNORMAL HIGH (ref 70–99)
Glucose-Capillary: 152 mg/dL — ABNORMAL HIGH (ref 70–99)
Glucose-Capillary: 153 mg/dL — ABNORMAL HIGH (ref 70–99)
Glucose-Capillary: 156 mg/dL — ABNORMAL HIGH (ref 70–99)
Glucose-Capillary: 165 mg/dL — ABNORMAL HIGH (ref 70–99)

## 2014-05-08 LAB — TSH: TSH: 1.555 u[IU]/mL (ref 0.350–4.500)

## 2014-05-08 MED ORDER — PANTOPRAZOLE SODIUM 40 MG IV SOLR
40.0000 mg | Freq: Two times a day (BID) | INTRAVENOUS | Status: DC
  Administered 2014-05-08 – 2014-05-09 (×2): 40 mg via INTRAVENOUS
  Filled 2014-05-08 (×2): qty 40

## 2014-05-08 NOTE — Consult Note (Signed)
Referring Provider: No ref. provider found Primary Care Physician:  Alice Reichert, MD Primary Gastroenterologist:  Jonette Eva  Reason for Consultation:  COLON DISTENSTION  Impression: ADMITTED WITH DISTENDED AND TORTUOUS TRANSVERSE/SIGMOID COLON. PARTIAL OBSTRUCTION DUE TO BLADDER DISTENTION. PT UNABLE TO HAVE FOLEY BECAUSE HE PULLS IT OUT. PENDING APPT AT [facility] FOR MANAGEMENT OF BPH. ABDOMINAL DISTENTION HAS IMPROVED SINCE ADMISSION.  Plan: 1. I & 0 CATH Q3H 2. CLAMP NG. CHECK RESIDUALS Q4H. 3. CLEAR LIQUID DIET 4. BID PPI 5. CONTINUE TO MONITOR SYMPTOMS.  HPI:  LIMITED DUE TO PT COGNITIVE DYSFUNCTION. HISTORY OBTAINED FROM AUNT AND UNCLE. AUNT STATES HE DOESN'T HAVE A LINING IN HIS STOMACH. PT BROUGHT TO ED DUE TO POOR PO INTAKE AND HE USUALLY IS A GOOD EATER. NO VOMITING. EVALUATION IN ED SHOWED DISTENDED STOMACH, & DILATED SMALL BOWEL, COLON, AND BLADDER.  PT HAD NO FEVER, CHILLS, melena, diarrhea, OR CHANGE IN BOWEL IN HABITS.  Past Medical History  Diagnosis Date  . Diabetes mellitus without complication   . Hypertension   . Urinary retention     History reviewed. No pertinent past surgical history.  Prior to Admission medications   Medication Sig Start Date End Date Taking? Authorizing Provider  aspirin EC 81 MG tablet Take 81 mg by mouth every morning.    Yes Historical Provider, MD  atorvastatin (LIPITOR) 20 MG tablet Take 20 mg by mouth every evening.    Yes Historical Provider, MD  lisinopril (PRINIVIL,ZESTRIL) 10 MG tablet Take 10 mg by mouth daily.   Yes Historical Provider, MD  metFORMIN (GLUCOPHAGE-XR) 500 MG 24 hr tablet Take 2 tablets by mouth every morning.  04/22/14  Yes Historical Provider, MD  tamsulosin (FLOMAX) 0.4 MG CAPS capsule Take 0.4 mg by mouth daily.   Yes Historical Provider, MD    Current Facility-Administered Medications  Medication Dose Route Frequency Provider Last Rate Last Dose  . aspirin EC tablet 81 mg  81 mg Oral q morning - 10a  Houston Siren, MD   81 mg at 05/08/14 1000  . atorvastatin (LIPITOR) tablet 20 mg  20 mg Oral QPM Houston Siren, MD      . dextrose 5 %-0.9 % sodium chloride infusion   Intravenous Continuous Houston Siren, MD 100 mL/hr at 05/08/14 4401    . heparin injection 5,000 Units  5,000 Units Subcutaneous 3 times per day Houston Siren, MD   5,000 Units at 05/08/14 0521  . insulin aspart (novoLOG) injection 0-9 Units  0-9 Units Subcutaneous 6 times per day Houston Siren, MD   2 Units at 05/08/14 [phone]  . lisinopril (PRINIVIL,ZESTRIL) tablet 10 mg  10 mg Oral Daily Houston Siren, MD   10 mg at 05/08/14 1000  . ondansetron (ZOFRAN) tablet 4 mg  4 mg Oral Q6H PRN Houston Siren, MD       Or  . ondansetron [facility]) injection 4 mg  4 mg Intravenous Q6H PRN Houston Siren, MD      . tamsulosin [facility]) capsule 0.4 mg  0.4 mg Oral Daily Houston Siren, MD   0.4 mg at 05/08/14 1000    Allergies as of 05/07/2014  . (No Known Allergies)    History reviewed. No pertinent family history.   History   Social History  . Marital Status: Single    Spouse Name: N/A    Number of Children: N/A  . Years of Education: N/A   Occupational History  . Not on file.   Social History Main Topics  . Smoking status:  Never Smoker   . Smokeless tobacco: Not on file  . Alcohol Use: No  . Drug Use: No  . Sexual Activity: Not on file   Other Topics Concern  . Not on file   Social History Narrative   Review of Systems: PER HPI OTHERWISE ALL SYSTEMS ARE NEGATIVE.  Vitals: Blood pressure 116/63, pulse 84, temperature 98.1 F (36.7 C), temperature source Oral, resp. rate 18, height 5\' 6"  (1.676 m), weight 163 lb 2.3 oz (74 kg), SpO2 95 %.  Physical Exam: General:   Alert, in NAD, NG TUBE IN PLACE Head:  Normocephalic and atraumatic. Neck:  Supple; no masses. Lungs:  Clear throughout to auscultation.   No wheezes. No acute distress. Heart:  Regular rate and rhythm; no murmurs. Abdomen:  Soft, nontender and distended. No masses noted. Normal bowel sounds,  without guarding, and without rebound.   Msk:  Symmetrical. Extremities:  Without edema. Neurologic:  Alert, NO  NEW FOCAL DEFICITS Psych:  Alert, NO NEW FOCAL DEFICITS  Lab Results:  Recent Labs  05/07/14 1733  WBC 16.9*  HGB 12.2*  HCT 37.7*  PLT 129*   BMET  Recent Labs  05/07/14 1733 05/08/14 0906  NA 134* 136  K 4.0 4.3  CL 103 106  CO2 22 27  GLUCOSE 184* 149*  BUN 26* 15  CREATININE 0.90 0.77  CALCIUM 9.7 8.7   LFT  Recent Labs  05/07/14 1733  PROT 7.8  ALBUMIN 4.3  AST 23  ALT 20  ALKPHOS 55  BILITOT 1.0   Studies/Results: I PERSONALLY REVIEWED THE CT JAN 22 WITH DR. POP. BLADDER COMPRESSING SIGMOID COLON, NO COLON OR SMALL BOWEL OBSTRUCTION.     LOS: 1 day   [facility] Clevland Cork  05/08/2014, 1:14 PM

## 2014-05-08 NOTE — Progress Notes (Signed)
Patient has distended bladder per abdomen xray. Dr. Renard MatterMcinnis notified. New order to In and out cath patient as needed if residual on bladder scan.

## 2014-05-08 NOTE — Progress Notes (Signed)
Patient in and out cath, with 1100cc return

## 2014-05-08 NOTE — Progress Notes (Signed)
Subjective: The patient does not communicate. He has mental retardation. He does have NG tube in and is on intravenous fluids. He was brought into the emergency room with urinary retention was catheterized and approximately 900 mL of urine obtained. He is more comfortable in this regard. Patient did have CT of abdomen which showed evidence of gastric distention distention of colon and small bowel felt to be ileus and gastroparesis and of note was 3.1 cm bladder diverticulum. Radiation seeds noted in prostate  Objective: Vital signs in last 24 hours: Temp:  [97.3 F (36.3 C)-98.9 F (37.2 C)] 98.1 F (36.7 C) (01/23 0543) Pulse Rate:  [84-99] 84 (01/23 0543) Resp:  [12-20] 18 (01/23 0543) BP: (101-132)/(47-75) 116/63 mmHg (01/23 0543) SpO2:  [92 %-100 %] 95 % (01/23 0543) Weight:  [74 kg (163 lb 2.3 oz)-74.844 kg (165 lb)] 74 kg (163 lb 2.3 oz) (01/22 2308) Weight change:  Last BM Date:  (unknown )  Intake/Output from previous day: 01/22 0701 - 01/23 0700 In: 596.7 [I.V.:596.7] Out: 1850 [Urine:1000; Emesis/NG output:450] Intake/Output this shift:    Physical Exam: Gen. appearance the patient is alert has NG tube in place. He is mentally retarded and does not communicate  H EENT negative  Neck supple no JVD or thyroid abnormalities  Lungs clear to P&A  Heart regular rhythm no murmurs  Abdomen slightly distended no palpable masses or organomegaly-normal bowel sounds  Extremities free of edema   Recent Labs  05/07/14 1733  WBC 16.9*  HGB 12.2*  HCT 37.7*  PLT 129*   BMET  Recent Labs  05/07/14 1733 05/08/14 0906  NA 134* 136  K 4.0 4.3  CL 103 106  CO2 22 27  GLUCOSE 184* 149*  BUN 26* 15  CREATININE 0.90 0.77  CALCIUM 9.7 8.7    Studies/Results: Ct Abdomen Pelvis W Contrast  05/07/2014   CLINICAL DATA:  Abdominal pain and nausea  EXAM: CT ABDOMEN AND PELVIS WITH CONTRAST  TECHNIQUE: Multidetector CT imaging of the abdomen and pelvis was performed using  the standard protocol following bolus administration of intravenous contrast.  CONTRAST:  25mL OMNIPAQUE IOHEXOL 300 MG/ML SOLN, OMNIPAQUE IOHEXOL 300 MG/ML SOLN  COMPARISON:  12/18/2012 and abdominal x-ray same day  FINDINGS: Lung bases shows streaky atelectasis or infiltrate right lower lobe posteriorly. There is streaky atelectasis left base posteriorly. Sagittal images of the spine are unremarkable. Minimal compression deformity T12 vertebral body is stable.  There is significant gastric distension with gas and fluid suspicious for gastroparesis. There is no evidence of gastric outlet obstruction. Multiple layering calcified gallstones are noted within gallbladder. No pericholecystic fluid is noted.  There is small hiatal hernia containing contrast material.  The pancreas, spleen and adrenal glands are unremarkable. Kidneys are symmetrical in size and enhancement. Nonobstructive calcified calculus in lower pole of the right kidney measures 4.8 mm. Staghorn calculus lower pole of the left kidney measures 1.3 cm. At least 3 nonobstructive calcified calculi are noted in midpole of the left kidney the largest measures 1.1 cm. Additional calculi in left kidney measures 7.4 mm.  No calcified ureteral calculi are noted bilaterally. No hydronephrosis or hydroureter. There are gaseous distended small bowel loops in lower abdomen without transition point in caliber highly suspicious for significant small bowel ileus.  There is significant gaseous distension of the colon. There is fluid in cecum and moderate gas in ascending colon. Significant gaseous distended and tortuous transverse colon. No definite obstructive colonic mass is noted. Mild focal thickening  in hepatic flexure of the colon probable due to spasm in axial image 32. There is no evidence of cecal volvulus.  No definite obstructing mass is noted in distal colon. There is some stool within rectum. Some stool is noted in distal sigmoid colon. The sigmoid  colon is also redundant with with some stool see axial image 55. There is gaseous distension of proximal sigmoid colon. There is some stool in descending colon. No definite colonic volvulus or colonic mass is noted.  Radiation seeds are noted in prostate gland region. The seminal vesicles are unremarkable. There is markedly distended trabeculated urinary bladder again noted. There is a small superior anterior urinary bladder diverticulum measures 3.1 cm. There is no abdominal ascites. No free abdominal air.  IMPRESSION: 1. There is right base posterior atelectasis or infiltrate. Left base posterior atelectasis is noted. 2. Significant gastric distension with air and fluid highly suspicious for gastroparesis. There is significant gaseous distension especially in transverse colon and some loop of sigmoid colon. Significant redundant transverse colon. Distended distal small bowel loops are noted with gas. Findings are most likely due to significant colonic and small bowel ileus. No definite transition point is small bowel caliber to suggest small bowel obstruction. No definite obstructive colonic mass is noted. There is some stool in descending colon and distal sigmoid colon and rectum. No colonic volvulus is identified. 3. Radiation seeds are noted in prostate gland region. 4. There is trabeculated significant distended urinary bladder. There is a urinary bladder diverticulum anterior superior aspect of the bladder measures 3.1 cm These results were called by telephone at the time of interpretation on 05/07/2014 at 7:49 pm to Dr. Azalia BilisKEVIN CAMPOS , who verbally acknowledged these results.   Electronically Signed   By: Natasha MeadLiviu  Pop M.D.   On: 05/07/2014 19:50   Dg Abd Acute W/chest  05/07/2014   CLINICAL DATA:  Distended abdomen.  Blood in the stool today  EXAM: ACUTE ABDOMEN SERIES (ABDOMEN 2 VIEW & CHEST 1 VIEW)  COMPARISON:  None.  FINDINGS: There is marked air-filled distended colon, greatest diameter 9.6 cm in the  transverse colon. There is relative lack of air in the bowel loops in the lower pelvis. The heart size is enlarged. The aorta is tortuous. There is atelectasis of both lung bases.  IMPRESSION: Marked air-filled distended colon greatest diameter 9.6 cm in the transverse colon. There is relative lack of air and the bowel loops of the lower pelvis. Distal colonic obstruction is not excluded. There is lack of bowel gas in the lower pelvis, pelvic mass is not excluded. Recommend further evaluation with CT abdomen and pelvis.   Electronically Signed   By: Sherian ReinWei-Chen  Lin M.D.   On: 05/07/2014 18:13    Medications:  . aspirin EC  81 mg Oral q morning - 10a  . atorvastatin  20 mg Oral QPM  . heparin  5,000 Units Subcutaneous 3 times per day  . insulin aspart  0-9 Units Subcutaneous 6 times per day  . lisinopril  10 mg Oral Daily  . tamsulosin  0.4 mg Oral Daily    . dextrose 5 % and 0.9% NaCl 100 mL/hr at 05/08/14 [id number]     Assessment/Plan: 1. urinary retention acute on chronic BPH question of prostate cancer-continue to intermittently catheterize patient if needed for urinary retention as patient will not allow Foley catheter to remain in. [facility]  2. Pseudoobstruction of colon and stomach-continue NG suction-continue IV fluids  and continue to monitor chemistries. Will obtain gastroenterology consult  3. Diabetes mellitus type 2-continue to monitor blood sugars and continue insulin aspart as ordered   LOS: 1 day   Wilkin Lippy G 05/08/2014, 11:34 AM

## 2014-05-09 LAB — BASIC METABOLIC PANEL
Anion gap: 4 — ABNORMAL LOW (ref 5–15)
BUN: 8 mg/dL (ref 6–23)
CO2: 29 mmol/L (ref 19–32)
Calcium: 8.7 mg/dL (ref 8.4–10.5)
Chloride: 106 mmol/L (ref 96–112)
Creatinine, Ser: 0.72 mg/dL (ref 0.50–1.35)
GFR calc Af Amer: >90 mL/min (ref >90)
GLUCOSE: 135 mg/dL — AB (ref 70–99)
POTASSIUM: 3.6 mmol/L (ref 3.5–5.1)
SODIUM: 139 mmol/L (ref 135–145)

## 2014-05-09 LAB — GLUCOSE, CAPILLARY
GLUCOSE-CAPILLARY: 145 mg/dL — AB (ref 70–99)
GLUCOSE-CAPILLARY: 154 mg/dL — AB (ref 70–99)
GLUCOSE-CAPILLARY: 161 mg/dL — AB (ref 70–99)
Glucose-Capillary: 111 mg/dL — ABNORMAL HIGH (ref 70–99)
Glucose-Capillary: 122 mg/dL — ABNORMAL HIGH (ref 70–99)
Glucose-Capillary: 128 mg/dL — ABNORMAL HIGH (ref 70–99)
Glucose-Capillary: 170 mg/dL — ABNORMAL HIGH (ref 70–99)

## 2014-05-09 MED ORDER — PANTOPRAZOLE SODIUM 40 MG PO TBEC
40.0000 mg | DELAYED_RELEASE_TABLET | Freq: Two times a day (BID) | ORAL | Status: DC
  Administered 2014-05-09 – 2014-05-11 (×4): 40 mg via ORAL
  Filled 2014-05-09 (×4): qty 1

## 2014-05-09 NOTE — Progress Notes (Signed)
Patient voids with 400cc output, bladder scan per Dr. Darrick PennaFields order post void, greater than 300cc. In and out cath performed as ordered with 500cc urine return.

## 2014-05-09 NOTE — Progress Notes (Signed)
Subjective: The patient still does not communicate because of mental retardation. He still has NG tube in and is on intravenous fluids. He apparently is voiding some and is more comfortable in this regard. He was seen by gastroenterology service. They noted that he had tortuous transverse sigmoid colon which was distended due to bladder distention. It is felt that he has no small bowel or colon obstruction  Objective: Vital signs in last 24 hours: Temp:  [98 F (36.7 C)-98.8 F (37.1 C)] 98.8 F (37.1 C) (01/24 0625) Pulse Rate:  [59-80] 80 (01/24 0625) Resp:  [16-20] 16 (01/24 0625) BP: (101-156)/(60-115) 141/73 mmHg (01/24 0625) SpO2:  [97 %-99 %] 98 % (01/24 0625) Weight change:  Last BM Date: 05/08/14  Intake/Output from previous day: 01/23 0701 - 01/24 0700 In: 1990 [P.O.:240; I.V.:1750] Out: 2300 [Urine:2300] Intake/Output this shift:    Physical Exam: Gen. appearance the patient is alert has NG tube in place. He is mentally retarded does not communicate  H EENT negative  Neck supple no JVD or thyroid abnormalities  Lungs clear to P&A  Heart regular rhythm no murmurs  Abdomen no palpable masses or organomegaly normal bowel sounds  Extremities free of edema   Recent Labs  05/07/14 1733  WBC 16.9*  HGB 12.2*  HCT 37.7*  PLT 129*   BMET  Recent Labs  05/08/14 0906 05/09/14 0527  NA 136 139  K 4.3 3.6  CL 106 106  CO2 27 29  GLUCOSE 149* 135*  BUN 15 8  CREATININE 0.77 0.72  CALCIUM 8.7 8.7    Studies/Results: Dg Abd 1 View  05/08/2014   CLINICAL DATA:  Subsequent encounter. Pseudoobstruction of colon. Abdominal pain and tenderness since yesterday.  EXAM: ABDOMEN - 1 VIEW  COMPARISON:  CT 05/07/2014  FINDINGS: There is gaseous distention throughout the bowel, probably not significantly changed from yesterday. A nasogastric tube is not visible, but portions of the left upper quadrant are not included within the field of view. There continues to be  distention of the urinary bladder, extending up to the L4 level, similar to yesterday's CT. No free intraperitoneal air is evident on these supine images.  IMPRESSION: Persistent gaseous distention throughout the bowel without significant interval change.  The reported NG tube is not visible but the proximal portions of the stomach are not included within the field of view of these images.  Persistent urinary bladder distention.   Electronically Signed   By: Ellery Plunk M.D.   On: 05/08/2014 11:35   Ct Abdomen Pelvis W Contrast  05/07/2014   CLINICAL DATA:  Abdominal pain and nausea  EXAM: CT ABDOMEN AND PELVIS WITH CONTRAST  TECHNIQUE: Multidetector CT imaging of the abdomen and pelvis was performed using the standard protocol following bolus administration of intravenous contrast.  CONTRAST:  25mL OMNIPAQUE IOHEXOL 300 MG/ML SOLN, OMNIPAQUE IOHEXOL 300 MG/ML SOLN  COMPARISON:  12/18/2012 and abdominal x-ray same day  FINDINGS: Lung bases shows streaky atelectasis or infiltrate right lower lobe posteriorly. There is streaky atelectasis left base posteriorly. Sagittal images of the spine are unremarkable. Minimal compression deformity T12 vertebral body is stable.  There is significant gastric distension with gas and fluid suspicious for gastroparesis. There is no evidence of gastric outlet obstruction. Multiple layering calcified gallstones are noted within gallbladder. No pericholecystic fluid is noted.  There is small hiatal hernia containing contrast material.  The pancreas, spleen and adrenal glands are unremarkable. Kidneys are symmetrical in size and enhancement. Nonobstructive calcified calculus  in lower pole of the right kidney measures 4.8 mm. Staghorn calculus lower pole of the left kidney measures 1.3 cm. At least 3 nonobstructive calcified calculi are noted in midpole of the left kidney the largest measures 1.1 cm. Additional calculi in left kidney measures 7.4 mm.  No calcified ureteral  calculi are noted bilaterally. No hydronephrosis or hydroureter. There are gaseous distended small bowel loops in lower abdomen without transition point in caliber highly suspicious for significant small bowel ileus.  There is significant gaseous distension of the colon. There is fluid in cecum and moderate gas in ascending colon. Significant gaseous distended and tortuous transverse colon. No definite obstructive colonic mass is noted. Mild focal thickening in hepatic flexure of the colon probable due to spasm in axial image 32. There is no evidence of cecal volvulus.  No definite obstructing mass is noted in distal colon. There is some stool within rectum. Some stool is noted in distal sigmoid colon. The sigmoid colon is also redundant with with some stool see axial image 55. There is gaseous distension of proximal sigmoid colon. There is some stool in descending colon. No definite colonic volvulus or colonic mass is noted.  Radiation seeds are noted in prostate gland region. The seminal vesicles are unremarkable. There is markedly distended trabeculated urinary bladder again noted. There is a small superior anterior urinary bladder diverticulum measures 3.1 cm. There is no abdominal ascites. No free abdominal air.  IMPRESSION: 1. There is right base posterior atelectasis or infiltrate. Left base posterior atelectasis is noted. 2. Significant gastric distension with air and fluid highly suspicious for gastroparesis. There is significant gaseous distension especially in transverse colon and some loop of sigmoid colon. Significant redundant transverse colon. Distended distal small bowel loops are noted with gas. Findings are most likely due to significant colonic and small bowel ileus. No definite transition point is small bowel caliber to suggest small bowel obstruction. No definite obstructive colonic mass is noted. There is some stool in descending colon and distal sigmoid colon and rectum. No colonic volvulus is  identified. 3. Radiation seeds are noted in prostate gland region. 4. There is trabeculated significant distended urinary bladder. There is a urinary bladder diverticulum anterior superior aspect of the bladder measures 3.1 cm These results were called by telephone at the time of interpretation on 05/07/2014 at 7:49 pm to Dr. Azalia BilisKEVIN CAMPOS , who verbally acknowledged these results.   Electronically Signed   By: Natasha MeadLiviu  Pop M.D.   On: 05/07/2014 19:50   Dg Abd Acute W/chest  05/07/2014   CLINICAL DATA:  Distended abdomen.  Blood in the stool today  EXAM: ACUTE ABDOMEN SERIES (ABDOMEN 2 VIEW & CHEST 1 VIEW)  COMPARISON:  None.  FINDINGS: There is marked air-filled distended colon, greatest diameter 9.6 cm in the transverse colon. There is relative lack of air in the bowel loops in the lower pelvis. The heart size is enlarged. The aorta is tortuous. There is atelectasis of both lung bases.  IMPRESSION: Marked air-filled distended colon greatest diameter 9.6 cm in the transverse colon. There is relative lack of air and the bowel loops of the lower pelvis. Distal colonic obstruction is not excluded. There is lack of bowel gas in the lower pelvis, pelvic mass is not excluded. Recommend further evaluation with CT abdomen and pelvis.   Electronically Signed   By: Sherian ReinWei-Chen  Lin M.D.   On: 05/07/2014 18:13    Medications:  . aspirin EC  81 mg Oral q morning -  10a  . atorvastatin  20 mg Oral QPM  . heparin  5,000 Units Subcutaneous 3 times per day  . insulin aspart  0-9 Units Subcutaneous 6 times per day  . lisinopril  10 mg Oral Daily  . pantoprazole (PROTONIX) IV  40 mg Intravenous BID AC  . tamsulosin  0.4 mg Oral Daily    . dextrose 5 % and 0.9% NaCl 50 mL/hr at 05/09/14 0020     Assessment/Plan: 1. Urinary retention-BPH questionable history of prostate cancer-continue to intermittently catheterize patient if needed for urinary retention. He is scheduled for TURP at [facility]  2.  Pseudoobstruction of colon and stomach continue NG suction continue IV fluids will gradually start liquid diet  3. Diabetes mellitus type 2-continue to monitor blood sugars continue insulin and aspart as ordered   LOS: 2 days   Xiomara Sevillano G 05/09/2014, 8:45 AM

## 2014-05-09 NOTE — Progress Notes (Signed)
Patient ID: [name], male   DOB: [birth year], 64 y.o.   MRN: [id number]   Assessment/Plan: ADMITTED WITH GASTRIC, SB, COLONIC DISTENTION DUE TO BLADDER COMPRESSING COLON. TOLERATING POs. VOIDING-POST VOID BLADDER SCAN > 300 CC.  PLAN: 1. CONTINUE POs. 2. FAMILY LIKLEY NEEDS TO I&O CTH PT BID TO PREVENT COMPRESSION OF COLON. [facility] TO ADDRESS BPH/URINARY RETENTION. 3. BID PPI 4. D/C NG TUBE.  Subjective: Since I last evaluated the patient HE IS TOLERATING DYSPHAGIA 3 DIET. NO NAUSEA/VOMTIING. POST VOID BLADDER SCAN > 300 CC.  Objective: Vital signs in last 24 hours: Filed Vitals:   05/09/14 0625  BP: 141/73  Pulse: 80  Temp: 98.8 F (37.1 C)  Resp: 16   General appearance: alert and no distress Resp: clear to auscultation bilaterally Cardio: regular rate and rhythm GI: soft, non-tender; bowel sounds normal; no masses,  no organomegaly  Lab Results:  Cr 0.72  Studies/Results: Dg Abd 1 View  05/08/2014   CLINICAL DATA:  Subsequent encounter. Pseudoobstruction of colon. Abdominal pain and tenderness since yesterday.  EXAM: ABDOMEN - 1 VIEW  COMPARISON:  CT 05/07/2014  FINDINGS: There is gaseous distention throughout the bowel, probably not significantly changed from yesterday. A nasogastric tube is not visible, but portions of the left upper quadrant are not included within the field of view. There continues to be distention of the urinary bladder, extending up to the L4 level, similar to yesterday's CT. No free intraperitoneal air is evident on these supine images.  IMPRESSION: Persistent gaseous distention throughout the bowel without significant interval change.  The reported NG tube is not visible but the proximal portions of the stomach are not included within the field of view of these images.  Persistent urinary bladder distention.   Electronically Signed   By: Ellery Plunkaniel R Mitchell M.D.   On: 05/08/2014 11:35    Medications: I have reviewed the  patient's current medications.   LOS: 5 days   Jonette EvaSandi Conda Wannamaker 09/24/2013, 2:23 PM

## 2014-05-10 LAB — GLUCOSE, CAPILLARY
GLUCOSE-CAPILLARY: 183 mg/dL — AB (ref 70–99)
Glucose-Capillary: 164 mg/dL — ABNORMAL HIGH (ref 70–99)
Glucose-Capillary: 119 mg/dL — ABNORMAL HIGH (ref 70–99)
Glucose-Capillary: 130 mg/dL — ABNORMAL HIGH (ref 70–99)
Glucose-Capillary: 166 mg/dL — ABNORMAL HIGH (ref 70–99)

## 2014-05-10 LAB — PSA: PSA: 0.67 ng/mL (ref <=4.00)

## 2014-05-10 NOTE — Progress Notes (Signed)
Subjective: The patient still does not communicate because of his mental retardation. He appears fairly comfortable. His NG tube has been removed and he's been started on liquids which seems to be tolerating. He is voiding some on his own but had to be catheterized and residual urine was noted  Objective: Vital signs in last 24 hours: Temp:  [97.7 F (36.5 C)-98.7 F (37.1 C)] 98.7 F (37.1 C) (01/25 0357) Pulse Rate:  [79-81] 81 (01/25 0357) Resp:  [18] 18 (01/25 0357) BP: (101-114)/(49-61) 114/49 mmHg (01/25 0357) SpO2:  [95 %-98 %] 95 % (01/25 0357) Weight change:  Last BM Date: 05/08/14  Intake/Output from previous day: 01/24 0701 - 01/25 0700 In: 1152.5 [I.V.:1152.5] Out: 3300 [Urine:3300] Intake/Output this shift: Total I/O In: 1152.5 [I.V.:1152.5] Out: 1300 [Urine:1300]  Physical Exam: Gen. appearance the patient is alert. He is mentally retarded and does not communicate  H EENT negative  Neck supple no JVD or thyroid abnormalities  Lungs clear to P&A  Heart regular rhythm no murmurs  Abdomen no palpable organs or masses no organomegaly, no distention  Extremities free of edema   Recent Labs  05/07/14 1733  WBC 16.9*  HGB 12.2*  HCT 37.7*  PLT 129*   BMET  Recent Labs  05/08/14 0906 05/09/14 0527  NA 136 139  K 4.3 3.6  CL 106 106  CO2 27 29  GLUCOSE 149* 135*  BUN 15 8  CREATININE 0.77 0.72  CALCIUM 8.7 8.7    Studies/Results: Dg Abd 1 View  05/08/2014   CLINICAL DATA:  Subsequent encounter. Pseudoobstruction of colon. Abdominal pain and tenderness since yesterday.  EXAM: ABDOMEN - 1 VIEW  COMPARISON:  CT 05/07/2014  FINDINGS: There is gaseous distention throughout the bowel, probably not significantly changed from yesterday. A nasogastric tube is not visible, but portions of the left upper quadrant are not included within the field of view. There continues to be distention of the urinary bladder, extending up to the L4 level, similar to  yesterday's CT. No free intraperitoneal air is evident on these supine images.  IMPRESSION: Persistent gaseous distention throughout the bowel without significant interval change.  The reported NG tube is not visible but the proximal portions of the stomach are not included within the field of view of these images.  Persistent urinary bladder distention.   Electronically Signed   By: Ellery Plunkaniel R Mitchell M.D.   On: 05/08/2014 11:35    Medications:  . aspirin EC  81 mg Oral q morning - 10a  . atorvastatin  20 mg Oral QPM  . heparin  5,000 Units Subcutaneous 3 times per day  . insulin aspart  0-9 Units Subcutaneous 6 times per day  . lisinopril  10 mg Oral Daily  . pantoprazole  40 mg Oral BID AC  . tamsulosin  0.4 mg Oral Daily    . dextrose 5 % and 0.9% NaCl 50 mL/hr at 05/09/14 2057     Assessment/Plan: 1. Urinary retention due to BPH questionable history of prostate cancer. Plan to continue to intermittently catheterize patient as needed for urinary retention. [facility]  2. Pseudoobstruction of colon and stomach-improved to continue advancing diet  3. Diabetes mellitus type 2 to continue to monitor sugars continue insulin as needed   LOS: 3 days   Hartford Maulden G 05/10/2014, 6:37 AM

## 2014-05-10 NOTE — Progress Notes (Signed)
1705 Dr.McInnis notified per family request regarding doctor's apt family had set-up for patient with Dr. Jerene BearsGopal H. Badlani[place] at [facility]9am in the morning 05/11/14. Dr.McInnis stated to allow the family to practice with I&O catheterization for the patient for the remainder of the evening and he would be in early in the AM to d/c patient so family [place] [facility]. Family made aware.

## 2014-05-10 NOTE — Care Management Note (Signed)
    Page 1 of 2   05/11/2014     10:27:10 AM CARE MANAGEMENT NOTE 05/11/2014  Patient:  [name],[name]   Account Number:  [id number]  Date Initiated:  05/10/2014  Documentation initiated by:  [name],[name]  Subjective/Objective Assessment:   Pt admitted from home with SBO and urinary retention. Pt lives with his family and will return home at discharge. Pt has MR but is able to complete most ADl'[name] with some assistance per the family.     Action/Plan:   Will continue to follow. Family is going to need to learn how to complete in and out caths BID. [facility]. [facility] after discharge.   Anticipated DC Date:  05/11/2014   Anticipated DC Plan:  HOME W HOME HEALTH SERVICES      DC Planning Services  CM consult      [facility]  [facility]   Choice offered to / List presented to:  [name]-5 Sibling        HH arranged  HH-1 RN      [facility]  [facility]   Status of service:  Completed, signed off Medicare Important Message given?   (If response is "NO", the following Medicare IM given date fields will be blank) Date Medicare IM given:   Medicare IM given by:   Date Additional Medicare IM given:   Additional Medicare IM given by:    Discharge Disposition:  HOME/SELF CARE  Per UR Regulation:    If discussed at Long Length of Stay Meetings, dates discussed:    Comments:  05/11/14 1020 Levi Queenammy Taylore Hinde, RN BSN CM Pt [facility] (per family choice). [name] is aware and will collect the pts information from the chart. HH services to start on the evening of discharge. No DME needs noted. Pts family and pts nurse aware of discharge arrangements.  05/10/14 1340 Levi Queenammy Macio Kissoon, RN BSN CM

## 2014-05-10 NOTE — Progress Notes (Signed)
    Subjective: Unable to obtain history due to cognitive status; aunt at bedside. Patient eating breakfast well, drinking coffee. BM X 2 in past 24 hours.   Objective: Vital signs in last 24 hours: Temp:  [97.7 F (36.5 C)-98.7 F (37.1 C)] 98.7 F (37.1 C) (01/25 0357) Pulse Rate:  [79-81] 81 (01/25 0357) Resp:  [18] 18 (01/25 0357) BP: (101-114)/(49-61) 114/49 mmHg (01/25 0357) SpO2:  [95 %-98 %] 95 % (01/25 0357) Last BM Date: 05/08/14 General:   Alert, drinking coffee.  Head:  Normocephalic and atraumatic. Abdomen:  Bowel sounds present, rounded but soft, no TTP.  Msk:  Symmetrical without gross deformities. Normal posture. Psych:  Alert and cooperative.   Intake/Output from previous day: 01/24 0701 - 01/25 0700 In: 1152.5 [I.V.:1152.5] Out: 3300 [Urine:3300] Intake/Output this shift:    Lab Results:  Recent Labs  05/07/14 1733  WBC 16.9*  HGB 12.2*  HCT 37.7*  PLT 129*   BMET  Recent Labs  05/07/14 1733 05/08/14 0906 05/09/14 0527  NA 134* 136 139  K 4.0 4.3 3.6  CL 103 106 106  CO2 22 27 29   GLUCOSE 184* 149* 135*  BUN 26* 15 8  CREATININE 0.90 0.77 0.72  CALCIUM 9.7 8.7 8.7   LFT  Recent Labs  05/07/14 1733  PROT 7.8  ALBUMIN 4.3  AST 23  ALT 20  ALKPHOS 55  BILITOT 1.0     Studies/Results: Dg Abd 1 View  05/08/2014   CLINICAL DATA:  Subsequent encounter. Pseudoobstruction of colon. Abdominal pain and tenderness since yesterday.  EXAM: ABDOMEN - 1 VIEW  COMPARISON:  CT 05/07/2014  FINDINGS: There is gaseous distention throughout the bowel, probably not significantly changed from yesterday. A nasogastric tube is not visible, but portions of the left upper quadrant are not included within the field of view. There continues to be distention of the urinary bladder, extending up to the L4 level, similar to yesterday's CT. No free intraperitoneal air is evident on these supine images.  IMPRESSION: Persistent gaseous distention throughout the  bowel without significant interval change.  The reported NG tube is not visible but the proximal portions of the stomach are not included within the field of view of these images.  Persistent urinary bladder distention.   Electronically Signed   By: Ellery Plunkaniel R Mitchell M.D.   On: 05/08/2014 11:35    Assessment: 64 year old male admitted with gastric, small bowel, and colonic distension secondary to bladder compression. Clinically improved, tolerating diet, +BMs. Will sign off as no further GI evaluation/intervention needed.     Plan: [facility]  BID PPI Signing off   Nira RetortAnna W. Sams, ANP-BC [facility]     LOS: 3 days    05/10/2014, 8:01 AM

## 2014-05-10 NOTE — Progress Notes (Signed)
1355 Bladder scan showed < urine, no distention noted, no discomfort noted.

## 2014-05-10 NOTE — Progress Notes (Signed)
UR chart review completed.  

## 2014-05-11 LAB — GLUCOSE, CAPILLARY
GLUCOSE-CAPILLARY: 130 mg/dL — AB (ref 70–99)
GLUCOSE-CAPILLARY: 149 mg/dL — AB (ref 70–99)
GLUCOSE-CAPILLARY: 185 mg/dL — AB (ref 70–99)
Glucose-Capillary: 152 mg/dL — ABNORMAL HIGH (ref 70–99)

## 2014-05-11 MED ORDER — PANTOPRAZOLE SODIUM 40 MG PO TBEC: 40.0000 mg | DELAYED_RELEASE_TABLET | Freq: Two times a day (BID) | ORAL | Status: DC

## 2014-05-11 NOTE — Discharge Summary (Signed)
Physician Discharge Summary  NETTIE CROMWELL ZOX:[id number] DOB: 12/01/1950 DOA: 05/07/2014  PCP: Alice Reichert, MD  Admit date: 05/07/2014 Discharge date: 05/11/2014     Discharge Diagnoses:  1. Urinary retention BPH questionable prostate cancer 2. Mental retardation 3. Diabetes mellitus type 2 4. Gastric and colonic distention initially by x-ray no obstruction or ileus and gastroparesis 5. .  Discharge Condition: Stable Disposition: Home  Diet recommendation: 2000 Carrie ADA diet  Filed Weights   05/07/14 1717 05/07/14 2308  Weight: 74.844 kg (165 lb) 74 kg (163 lb 2.3 oz)    History of present illness:  The patient is mental retarded. He is brought into the emergency room with urinary retention. He was catheterized approximately 900 mL of urine obtained. He did have CT of abdomen which showed gastric distention and distention of colon and small bowel felt to be ileus and gastroparesis. Radiation seeds noted in prostate and 3.1 cm bladder diverticulum noted  Hospital Course:  The patient was admitted to MedSurg floor NG tube was inserted in ED. And he was started on intravenous fluids. Examination on admission revealed minimally retarded patient. Patient had NG tube in place. Lungs were clear to P&A, heart regular rhythm, abdomen slightly distended.. The patient's blood sugars range from 111-183. He remained on sliding scale short-acting insulin aspart. He was seen by gastroenterologist and the was felt he did not have colonic obstruction. He remained on NG suction for approximately 48 hours and then this was discontinued he was started on clear liquids. During this period of time he had to be catheterized intermittently because of distended bladder. The patient will not allow Foley catheter to remain in because of mental retardation.   Discharge Instructions Patient is to continue the medications listed below. He has an appointment with urology at [facility].  Family is to take patient there and home health will assist in the catheterization intermittently    Medication List    TAKE these medications        aspirin EC 81 MG tablet  Take 81 mg by mouth every morning.     atorvastatin 20 MG tablet  Commonly known as:  LIPITOR  Take 20 mg by mouth every evening.     lisinopril 10 MG tablet  Commonly known as:  PRINIVIL,ZESTRIL  Take 10 mg by mouth daily.     metFORMIN 500 MG 24 hr tablet  Commonly known as:  GLUCOPHAGE-XR  Take 2 tablets by mouth every morning.     pantoprazole 40 MG tablet  Commonly known as:  PROTONIX  Take 1 tablet (40 mg total) by mouth 2 (two) times daily before a meal.     tamsulosin 0.4 MG Caps capsule  Commonly known as:  FLOMAX  Take 0.4 mg by mouth daily.       No Known Allergies  The results of significant diagnostics from this hospitalization (including imaging, microbiology, ancillary and laboratory) are listed below for reference.    Significant Diagnostic Studies: Dg Abd 1 View  05/08/2014   CLINICAL DATA:  Subsequent encounter. Pseudoobstruction of colon. Abdominal pain and tenderness since yesterday.  EXAM: ABDOMEN - 1 VIEW  COMPARISON:  CT 05/07/2014  FINDINGS: There is gaseous distention throughout the bowel, probably not significantly changed from yesterday. A nasogastric tube is not visible, but portions of the left upper quadrant are not included within the field of view. There continues to be distention of the urinary bladder, extending up to the L4 level, similar to yesterday's  CT. No free intraperitoneal air is evident on these supine images.  IMPRESSION: Persistent gaseous distention throughout the bowel without significant interval change.  The reported NG tube is not visible but the proximal portions of the stomach are not included within the field of view of these images.  Persistent urinary bladder distention.   Electronically Signed   By: Ellery Plunk M.D.   On: 05/08/2014 11:35   Ct  Abdomen Pelvis W Contrast  05/07/2014   CLINICAL DATA:  Abdominal pain and nausea  EXAM: CT ABDOMEN AND PELVIS WITH CONTRAST  TECHNIQUE: Multidetector CT imaging of the abdomen and pelvis was performed using the standard protocol following bolus administration of intravenous contrast.  CONTRAST:  25mL OMNIPAQUE IOHEXOL 300 MG/ML SOLN, OMNIPAQUE IOHEXOL 300 MG/ML SOLN  COMPARISON:  12/18/2012 and abdominal x-ray same day  FINDINGS: Lung bases shows streaky atelectasis or infiltrate right lower lobe posteriorly. There is streaky atelectasis left base posteriorly. Sagittal images of the spine are unremarkable. Minimal compression deformity T12 vertebral body is stable.  There is significant gastric distension with gas and fluid suspicious for gastroparesis. There is no evidence of gastric outlet obstruction. Multiple layering calcified gallstones are noted within gallbladder. No pericholecystic fluid is noted.  There is small hiatal hernia containing contrast material.  The pancreas, spleen and adrenal glands are unremarkable. Kidneys are symmetrical in size and enhancement. Nonobstructive calcified calculus in lower pole of the right kidney measures 4.8 mm. Staghorn calculus lower pole of the left kidney measures 1.3 cm. At least 3 nonobstructive calcified calculi are noted in midpole of the left kidney the largest measures 1.1 cm. Additional calculi in left kidney measures 7.4 mm.  No calcified ureteral calculi are noted bilaterally. No hydronephrosis or hydroureter. There are gaseous distended small bowel loops in lower abdomen without transition point in caliber highly suspicious for significant small bowel ileus.  There is significant gaseous distension of the colon. There is fluid in cecum and moderate gas in ascending colon. Significant gaseous distended and tortuous transverse colon. No definite obstructive colonic mass is noted. Mild focal thickening in hepatic flexure of the colon probable due to spasm  in axial image 32. There is no evidence of cecal volvulus.  No definite obstructing mass is noted in distal colon. There is some stool within rectum. Some stool is noted in distal sigmoid colon. The sigmoid colon is also redundant with with some stool see axial image 55. There is gaseous distension of proximal sigmoid colon. There is some stool in descending colon. No definite colonic volvulus or colonic mass is noted.  Radiation seeds are noted in prostate gland region. The seminal vesicles are unremarkable. There is markedly distended trabeculated urinary bladder again noted. There is a small superior anterior urinary bladder diverticulum measures 3.1 cm. There is no abdominal ascites. No free abdominal air.  IMPRESSION: 1. There is right base posterior atelectasis or infiltrate. Left base posterior atelectasis is noted. 2. Significant gastric distension with air and fluid highly suspicious for gastroparesis. There is significant gaseous distension especially in transverse colon and some loop of sigmoid colon. Significant redundant transverse colon. Distended distal small bowel loops are noted with gas. Findings are most likely due to significant colonic and small bowel ileus. No definite transition point is small bowel caliber to suggest small bowel obstruction. No definite obstructive colonic mass is noted. There is some stool in descending colon and distal sigmoid colon and rectum. No colonic volvulus is identified. 3. Radiation seeds are noted in  prostate gland region. 4. There is trabeculated significant distended urinary bladder. There is a urinary bladder diverticulum anterior superior aspect of the bladder measures 3.1 cm These results were called by telephone at the time of interpretation on 05/07/2014 at 7:49 pm to Dr. Azalia BilisKEVIN CAMPOS , who verbally acknowledged these results.   Electronically Signed   By: Natasha MeadLiviu  Pop M.D.   On: 05/07/2014 19:50   Dg Abd Acute W/chest  05/07/2014   CLINICAL DATA:   Distended abdomen.  Blood in the stool today  EXAM: ACUTE ABDOMEN SERIES (ABDOMEN 2 VIEW & CHEST 1 VIEW)  COMPARISON:  None.  FINDINGS: There is marked air-filled distended colon, greatest diameter 9.6 cm in the transverse colon. There is relative lack of air in the bowel loops in the lower pelvis. The heart size is enlarged. The aorta is tortuous. There is atelectasis of both lung bases.  IMPRESSION: Marked air-filled distended colon greatest diameter 9.6 cm in the transverse colon. There is relative lack of air and the bowel loops of the lower pelvis. Distal colonic obstruction is not excluded. There is lack of bowel gas in the lower pelvis, pelvic mass is not excluded. Recommend further evaluation with CT abdomen and pelvis.   Electronically Signed   By: Sherian ReinWei-Chen  Lin M.D.   On: 05/07/2014 18:13    Microbiology: No results found for this or any previous visit (from the past 240 hour(s)).   Labs: Basic Metabolic Panel:  Recent Labs Lab 05/07/14 1733 05/08/14 0906 05/09/14 0527  NA 134* 136 139  K 4.0 4.3 3.6  CL 103 106 106  CO2 22 27 29   GLUCOSE 184* 149* 135*  BUN 26* 15 8  CREATININE 0.90 0.77 0.72  CALCIUM 9.7 8.7 8.7   Liver Function Tests:  Recent Labs Lab 05/07/14 1733  AST 23  ALT 20  ALKPHOS 55  BILITOT 1.0  PROT 7.8  ALBUMIN 4.3    Recent Labs Lab 05/07/14 1733  LIPASE 26   No results for input(s): AMMONIA in the last 168 hours. CBC:  Recent Labs Lab 05/07/14 1733  WBC 16.9*  NEUTROABS 15.1*  HGB 12.2*  HCT 37.7*  MCV 91.7  PLT 129*   Cardiac Enzymes: No results for input(s): CKTOTAL, CKMB, CKMBINDEX, TROPONINI in the last 168 hours. BNP: BNP (last 3 results) No results for input(s): PROBNP in the last 8760 hours. CBG:  Recent Labs Lab 05/10/14 1148 05/10/14 1623 05/10/14 2029 05/11/14 0019 05/11/14 0418  GLUCAP 183* 130* 166* 130* 149*    Principal Problem:   Pseudo-obstruction of colon Active Problems:   Urinary retention    Mental retardation   Hyperlipidemia   HTN (hypertension)   Time coordinating discharge: 45 minutes Signed:  Butch PennyAngus Yury Schaus, MD 05/11/2014, 6:06 AM

## 2014-05-11 NOTE — Progress Notes (Signed)
Educated pt's family member (aunt) who will be responsible for in and out caths once pt is discharged. Information printed and reviewed before hand and then we walked through it together, letting her perform the actions while I was there to guide if needed and answer questions. Pts aunt performed the I&O outh correctly and safely and was reminded on steps to take to prevent infection. Pt is currently eating lunch and then will be waiting on ride before discharging from the hospital.

## 2014-07-20 ENCOUNTER — Encounter (HOSPITAL_COMMUNITY): Payer: Self-pay | Admitting: Emergency Medicine

## 2014-07-20 ENCOUNTER — Emergency Department (HOSPITAL_COMMUNITY)
Admission: EM | Admit: 2014-07-20 | Discharge: 2014-07-20 | Disposition: A | Discharge status: Home / Self Care | Payer: Medicaid Other | Attending: Emergency Medicine | Admitting: Emergency Medicine

## 2014-07-20 DIAGNOSIS — I10 Essential (primary) hypertension: Secondary | ICD-10-CM | POA: Diagnosis not present

## 2014-07-20 DIAGNOSIS — N390 Urinary tract infection, site not specified: Secondary | ICD-10-CM | POA: Insufficient documentation

## 2014-07-20 DIAGNOSIS — Z7982 Long term (current) use of aspirin: Secondary | ICD-10-CM | POA: Insufficient documentation

## 2014-07-20 DIAGNOSIS — Z79899 Other long term (current) drug therapy: Secondary | ICD-10-CM | POA: Insufficient documentation

## 2014-07-20 DIAGNOSIS — R319 Hematuria, unspecified: Secondary | ICD-10-CM | POA: Diagnosis present

## 2014-07-20 DIAGNOSIS — E119 Type 2 diabetes mellitus without complications: Secondary | ICD-10-CM | POA: Insufficient documentation

## 2014-07-20 LAB — URINALYSIS, ROUTINE W REFLEX MICROSCOPIC
BILIRUBIN URINE: NEGATIVE
Bilirubin Urine: NEGATIVE
GLUCOSE, UA: NEGATIVE mg/dL
GLUCOSE, UA: NEGATIVE mg/dL
KETONES UR: NEGATIVE mg/dL
Nitrite: NEGATIVE
Nitrite: NEGATIVE
PROTEIN: NEGATIVE mg/dL
Specific Gravity, Urine: 1.02 (ref 1.005–1.030)
Specific Gravity, Urine: 1.02 (ref 1.005–1.030)
UROBILINOGEN UA: 0.2 mg/dL (ref 0.0–1.0)
Urobilinogen, UA: 1 mg/dL (ref 0.0–1.0)
pH: 5.5 (ref 5.0–8.0)
pH: 6 (ref 5.0–8.0)

## 2014-07-20 LAB — URINE MICROSCOPIC-ADD ON

## 2014-07-20 MED ORDER — LIDOCAINE HCL (PF) 1 % IJ SOLN
INTRAMUSCULAR | Status: AC
  Administered 2014-07-20: 19:00:00
  Filled 2014-07-20: qty 5

## 2014-07-20 MED ORDER — CEFTRIAXONE SODIUM 1 G IJ SOLR
1.0000 g | Freq: Once | INTRAMUSCULAR | Status: AC
  Administered 2014-07-20: 1 g via INTRAMUSCULAR
  Filled 2014-07-20: qty 10

## 2014-07-20 MED ORDER — CIPROFLOXACIN HCL 500 MG PO TABS: 500.0000 mg | ORAL_TABLET | Freq: Two times a day (BID) | ORAL | Status: DC

## 2014-07-20 NOTE — ED Notes (Signed)
Pt alert, stable gait. Caregiver given discharge instructions, paperwork & prescription(s). Caregiver instructed to stop at the registration desk to finish any additional paperwork. Caregiver verbalized understanding that pt needs to be cath every 4 hours unless pt voids on his own . Pt left department w/ no further questions.

## 2014-07-20 NOTE — ED Notes (Signed)
No urine at this time. Pt given more water & crackers.

## 2014-07-20 NOTE — ED Provider Notes (Signed)
CSN: [id number]     Arrival date & time 07/20/14  1558 History   First MD Initiated Contact with Patient 07/20/14 1606     Chief Complaint  Patient presents with  . Hematuria     (Consider location/radiation/quality/duration/timing/severity/associated sxs/prior Treatment) HPI  This is a 64 year old male with a history of MR, nonverbal, diabetes, and hypertension who presents with hematuria. Patient's aunt provides history. She is one of his caregivers. She states that they in and out cath him for full bladder drainage daily. Today when she tried to cath him she noted blood clot without significant urine return. Patient has not had any reported fevers and has been acting at his baseline per the caregiver.  Level V caveat.  Past Medical History  Diagnosis Date  . Diabetes mellitus without complication   . Hypertension   . Urinary retention    History reviewed. No pertinent past surgical history. History reviewed. No pertinent family history. History  Substance Use Topics  . Smoking status: Never Smoker   . Smokeless tobacco: Not on file  . Alcohol Use: No    Review of Systems  Unable to perform ROS: Patient nonverbal      Allergies  Review of patient's allergies indicates no known allergies.  Home Medications   Prior to Admission medications   Medication Sig Start Date End Date Taking? Authorizing Provider  aspirin EC 81 MG tablet Take 81 mg by mouth every morning.    Yes Historical Provider, MD  atorvastatin (LIPITOR) 20 MG tablet Take 20 mg by mouth every evening.    Yes Historical Provider, MD  lisinopril (PRINIVIL,ZESTRIL) 10 MG tablet Take 10 mg by mouth daily.   Yes Historical Provider, MD  metFORMIN (GLUCOPHAGE-XR) 500 MG 24 hr tablet Take 2 tablets by mouth every morning.  04/22/14  Yes Historical Provider, MD  pantoprazole (PROTONIX) 40 MG tablet Take 1 tablet (40 mg total) by mouth 2 (two) times daily before a meal. 05/11/14  Yes Angus McInnis, MD  tamsulosin  (FLOMAX) 0.4 MG CAPS capsule Take 0.4 mg by mouth daily.   Yes Historical Provider, MD  ciprofloxacin (CIPRO) 500 MG tablet Take 1 tablet (500 mg total) by mouth 2 (two) times daily. 07/20/14   Shon Batonourtney F Horton, MD   BP 117/71 mmHg  Pulse 63  Temp(Src) 97.8 F (36.6 C) (Oral)  Resp 18  Ht 5\' 10"  (1.778 m)  Wt 163 lb (73.936 kg)  BMI 23.39 kg/m2  SpO2 99% Physical Exam  Constitutional: No distress.  HENT:  Head: Normocephalic and atraumatic.  Rhythmic movements of the tongue noted  Eyes: Pupils are equal, round, and reactive to light.  Cardiovascular: Normal rate, regular rhythm and normal heart sounds.   No murmur heard. Pulmonary/Chest: Effort normal and breath sounds normal. No respiratory distress. He has no wheezes.  Abdominal: Soft. Bowel sounds are normal. There is no tenderness. There is no rebound.  Musculoskeletal: He exhibits no edema.  Lymphadenopathy:    He has cervical adenopathy.  Neurological: He is alert.  Nonverbal  Skin: Skin is warm and dry.  Psychiatric:  Difficult to assess  Nursing note and vitals reviewed.   ED Course  Procedures (including critical care time) Labs Review Labs Reviewed  URINALYSIS, ROUTINE W REFLEX MICROSCOPIC - Abnormal; Notable for the following:    APPearance HAZY (*)    Hgb urine dipstick LARGE (*)    Protein, ur TRACE (*)    Leukocytes, UA MODERATE (*)    All other components within  normal limits  URINALYSIS, ROUTINE W REFLEX MICROSCOPIC - Abnormal; Notable for the following:    APPearance HAZY (*)    Hgb urine dipstick LARGE (*)    Ketones, ur TRACE (*)    Leukocytes, UA LARGE (*)    All other components within normal limits  URINE MICROSCOPIC-ADD ON - Abnormal; Notable for the following:    Squamous Epithelial / LPF FEW (*)    Bacteria, UA MANY (*)    All other components within normal limits  URINE MICROSCOPIC-ADD ON - Abnormal; Notable for the following:    Squamous Epithelial / LPF FEW (*)    Bacteria, UA MANY (*)     All other components within normal limits  URINE CULTURE    Imaging Review No results found.   EKG Interpretation None      MDM   Final diagnoses:  UTI (lower urinary tract infection)    Patient presents with hematuria. Is in and out cath at home for urinary retention. No fever. Otherwise patient appears at his baseline. Urine with evidence of acute urinary tract infection. Culture sent. Patient given IM Rocephin. Patient has been unable to spontaneously void. The caregiver has concerns about placing a indwelling catheter given that the patient normally tries to pull the catheter out. Discussed with the caregiver frequent in and out caths every 4 hours if the patient is unable to spontaneously void. They need to follow-up with his primary physician tomorrow. If he develops fever, altered mental status, or any new or worsening symptoms they should return.  After history, exam, and medical workup I feel the patient has been appropriately medically screened and is safe for discharge home. Pertinent diagnoses were discussed with the patient. Patient was given return precautions.   Shon Baton, MD 07/21/14 [phone]

## 2014-07-20 NOTE — ED Notes (Signed)
Pt still has not produced urine. More water given.

## 2014-07-20 NOTE — ED Notes (Signed)
Pt walked to restroom & attempted to urinate per family, pt states he did not make any urine.

## 2014-07-20 NOTE — ED Notes (Signed)
Family member states "I have to put a catheter in to drain his bladder and today it was hard to get in then I noticed there was blood in the catheter so I took it out and brought him here." States denies pain at this time.

## 2014-07-20 NOTE — Discharge Instructions (Signed)

## 2014-07-24 LAB — URINE CULTURE

## 2014-07-26 ENCOUNTER — Telehealth (HOSPITAL_COMMUNITY): Payer: Self-pay

## 2014-07-26 NOTE — Telephone Encounter (Signed)
Post ED Visit - Positive Culture Follow-up  Culture report reviewed by antimicrobial stewardship pharmacist: []  Wes Dulaney, Pharm.D., BCPS []  Celedonio MiyamotoJeremy Frens, Pharm.D., BCPS [x]  Georgina PillionElizabeth Martin, Pharm.D., BCPS []  EspartoMinh Pham, [address]Ph[address]D., BCPS, AAHIVP []  Estella HuskMichelle Turner, Pharm.D., BCPS, AAHIVP []  Elder CyphersLorie Poole, [address]Pharm[address]D., BCPS  Positive Urine culture, >/= 100,000 colonies -> E Coli Treated with Ciprofloxacin, organism sensitive to the same and no further patient follow-up is required at this time.  Arvid RightClark, Rolinda Impson Dorn 07/26/2014, 5:36 AM

## 2015-08-29 ENCOUNTER — Other Ambulatory Visit (HOSPITAL_COMMUNITY): Payer: Self-pay | Admitting: Internal Medicine

## 2015-08-29 ENCOUNTER — Ambulatory Visit (HOSPITAL_COMMUNITY)
Admission: RE | Admit: 2015-08-29 | Discharge: 2015-08-29 | Disposition: A | Discharge status: Home / Self Care | Payer: Medicare Other | Source: Ambulatory Visit | Attending: Internal Medicine | Admitting: Internal Medicine

## 2015-08-29 DIAGNOSIS — W1830XA Fall on same level, unspecified, initial encounter: Secondary | ICD-10-CM | POA: Diagnosis not present

## 2015-08-29 DIAGNOSIS — M25551 Pain in right hip: Secondary | ICD-10-CM

## 2015-08-29 DIAGNOSIS — M25521 Pain in right elbow: Secondary | ICD-10-CM

## 2016-06-19 ENCOUNTER — Inpatient Hospital Stay (HOSPITAL_COMMUNITY): Payer: Medicare Other

## 2016-06-19 ENCOUNTER — Encounter (HOSPITAL_COMMUNITY): Payer: Self-pay | Admitting: *Deleted

## 2016-06-19 ENCOUNTER — Inpatient Hospital Stay (HOSPITAL_COMMUNITY)
Admission: EM | Admit: 2016-06-19 | Discharge: 2016-06-23 | DRG: 872 | Disposition: A | Discharge status: Home Health | Payer: Medicare Other | Attending: Internal Medicine | Admitting: Internal Medicine

## 2016-06-19 ENCOUNTER — Emergency Department (HOSPITAL_COMMUNITY): Payer: Medicare Other

## 2016-06-19 DIAGNOSIS — E871 Hypo-osmolality and hyponatremia: Secondary | ICD-10-CM | POA: Diagnosis present

## 2016-06-19 DIAGNOSIS — N179 Acute kidney failure, unspecified: Secondary | ICD-10-CM | POA: Diagnosis present

## 2016-06-19 DIAGNOSIS — N136 Pyonephrosis: Secondary | ICD-10-CM | POA: Diagnosis present

## 2016-06-19 DIAGNOSIS — N390 Urinary tract infection, site not specified: Secondary | ICD-10-CM

## 2016-06-19 DIAGNOSIS — N132 Hydronephrosis with renal and ureteral calculous obstruction: Secondary | ICD-10-CM

## 2016-06-19 DIAGNOSIS — R6521 Severe sepsis with septic shock: Secondary | ICD-10-CM

## 2016-06-19 DIAGNOSIS — I959 Hypotension, unspecified: Secondary | ICD-10-CM | POA: Diagnosis present

## 2016-06-19 DIAGNOSIS — E785 Hyperlipidemia, unspecified: Secondary | ICD-10-CM | POA: Diagnosis present

## 2016-06-19 DIAGNOSIS — E878 Other disorders of electrolyte and fluid balance, not elsewhere classified: Secondary | ICD-10-CM | POA: Diagnosis present

## 2016-06-19 DIAGNOSIS — R296 Repeated falls: Secondary | ICD-10-CM | POA: Diagnosis present

## 2016-06-19 DIAGNOSIS — I10 Essential (primary) hypertension: Secondary | ICD-10-CM | POA: Diagnosis present

## 2016-06-19 DIAGNOSIS — Z7984 Long term (current) use of oral hypoglycemic drugs: Secondary | ICD-10-CM | POA: Diagnosis not present

## 2016-06-19 DIAGNOSIS — E119 Type 2 diabetes mellitus without complications: Secondary | ICD-10-CM | POA: Diagnosis present

## 2016-06-19 DIAGNOSIS — R6510 Systemic inflammatory response syndrome (SIRS) of non-infectious origin without acute organ dysfunction: Secondary | ICD-10-CM | POA: Diagnosis not present

## 2016-06-19 DIAGNOSIS — B961 Klebsiella pneumoniae [K. pneumoniae] as the cause of diseases classified elsewhere: Secondary | ICD-10-CM | POA: Diagnosis present

## 2016-06-19 DIAGNOSIS — R310 Gross hematuria: Secondary | ICD-10-CM

## 2016-06-19 DIAGNOSIS — R319 Hematuria, unspecified: Secondary | ICD-10-CM

## 2016-06-19 DIAGNOSIS — E876 Hypokalemia: Secondary | ICD-10-CM | POA: Diagnosis present

## 2016-06-19 DIAGNOSIS — R531 Weakness: Secondary | ICD-10-CM | POA: Diagnosis not present

## 2016-06-19 DIAGNOSIS — F79 Unspecified intellectual disabilities: Secondary | ICD-10-CM | POA: Diagnosis present

## 2016-06-19 DIAGNOSIS — N401 Enlarged prostate with lower urinary tract symptoms: Secondary | ICD-10-CM | POA: Diagnosis present

## 2016-06-19 DIAGNOSIS — A419 Sepsis, unspecified organism: Secondary | ICD-10-CM

## 2016-06-19 DIAGNOSIS — B964 Proteus (mirabilis) (morganii) as the cause of diseases classified elsewhere: Secondary | ICD-10-CM | POA: Diagnosis present

## 2016-06-19 DIAGNOSIS — N400 Enlarged prostate without lower urinary tract symptoms: Secondary | ICD-10-CM

## 2016-06-19 DIAGNOSIS — Z79899 Other long term (current) drug therapy: Secondary | ICD-10-CM

## 2016-06-19 DIAGNOSIS — Z7982 Long term (current) use of aspirin: Secondary | ICD-10-CM | POA: Diagnosis not present

## 2016-06-19 DIAGNOSIS — E118 Type 2 diabetes mellitus with unspecified complications: Secondary | ICD-10-CM

## 2016-06-19 DIAGNOSIS — A4159 Other Gram-negative sepsis: Principal | ICD-10-CM | POA: Diagnosis present

## 2016-06-19 DIAGNOSIS — R509 Fever, unspecified: Secondary | ICD-10-CM | POA: Diagnosis present

## 2016-06-19 LAB — CBC
HEMATOCRIT: 35.6 % — AB (ref 39.0–52.0)
Hemoglobin: 11.7 g/dL — ABNORMAL LOW (ref 13.0–17.0)
MCH: 29.7 pg (ref 26.0–34.0)
MCHC: 32.9 g/dL (ref 30.0–36.0)
MCV: 90.4 fL (ref 78.0–100.0)
PLATELETS: 115 10*3/uL — AB (ref 150–400)
RBC: 3.94 MIL/uL — ABNORMAL LOW (ref 4.22–5.81)
RDW: 13.4 % (ref 11.5–15.5)
WBC: 9.2 10*3/uL (ref 4.0–10.5)

## 2016-06-19 LAB — URINALYSIS, ROUTINE W REFLEX MICROSCOPIC
GLUCOSE, UA: 100 mg/dL — AB
Ketones, ur: 15 mg/dL — AB
Nitrite: POSITIVE — AB
Protein, ur: >300 mg/dL — AB
Specific Gravity, Urine: 1.01 (ref 1.005–1.030)
pH: 8.5 — ABNORMAL HIGH (ref 5.0–8.0)

## 2016-06-19 LAB — URINALYSIS, MICROSCOPIC (REFLEX)

## 2016-06-19 LAB — COMPREHENSIVE METABOLIC PANEL
ALT: 14 U/L — ABNORMAL LOW (ref 17–63)
ANION GAP: 9 (ref 5–15)
AST: 36 U/L (ref 15–41)
Albumin: 3.6 g/dL (ref 3.5–5.0)
Alkaline Phosphatase: 49 U/L (ref 38–126)
BILIRUBIN TOTAL: 1 mg/dL (ref 0.3–1.2)
BUN: 26 mg/dL — ABNORMAL HIGH (ref 6–20)
CHLORIDE: 98 mmol/L — AB (ref 101–111)
CO2: 26 mmol/L (ref 22–32)
Calcium: 8.7 mg/dL — ABNORMAL LOW (ref 8.9–10.3)
Creatinine, Ser: 1.47 mg/dL — ABNORMAL HIGH (ref 0.61–1.24)
GFR calc Af Amer: 56 mL/min — ABNORMAL LOW (ref >60)
GFR, EST NON AFRICAN AMERICAN: 48 mL/min — AB (ref >60)
Glucose, Bld: 135 mg/dL — ABNORMAL HIGH (ref 65–99)
POTASSIUM: 3.4 mmol/L — AB (ref 3.5–5.1)
Sodium: 133 mmol/L — ABNORMAL LOW (ref 135–145)
TOTAL PROTEIN: 7.3 g/dL (ref 6.5–8.1)

## 2016-06-19 LAB — INFLUENZA PANEL BY PCR (TYPE A & B)
INFLAPCR: NEGATIVE
Influenza B By PCR: NEGATIVE

## 2016-06-19 LAB — TSH: TSH: 1.008 u[IU]/mL (ref 0.350–4.500)

## 2016-06-19 LAB — I-STAT CG4 LACTIC ACID, ED: LACTIC ACID, VENOUS: 1.83 mmol/L (ref 0.5–1.9)

## 2016-06-19 LAB — LIPASE, BLOOD: Lipase: 11 U/L (ref 11–51)

## 2016-06-19 MED ORDER — ACETAMINOPHEN 500 MG PO TABS
1000.0000 mg | ORAL_TABLET | Freq: Once | ORAL | Status: AC
  Administered 2016-06-19: 1000 mg via ORAL
  Filled 2016-06-19: qty 2

## 2016-06-19 MED ORDER — SODIUM CHLORIDE 0.9 % IV BOLUS (SEPSIS)
1000.0000 mL | Freq: Once | INTRAVENOUS | Status: AC
  Administered 2016-06-19: 1000 mL via INTRAVENOUS

## 2016-06-19 MED ORDER — LEVOFLOXACIN IN D5W 500 MG/100ML IV SOLN: 500.0000 mg | INTRAVENOUS | Status: DC

## 2016-06-19 MED ORDER — TAMSULOSIN HCL 0.4 MG PO CAPS
0.4000 mg | ORAL_CAPSULE | Freq: Every day | ORAL | Status: DC
  Administered 2016-06-20 – 2016-06-23 (×4): 0.4 mg via ORAL
  Filled 2016-06-19 (×4): qty 1

## 2016-06-19 MED ORDER — ACETAMINOPHEN 500 MG PO TABS: 1000.0000 mg | ORAL_TABLET | Freq: Once | ORAL | Status: DC

## 2016-06-19 MED ORDER — TRAMADOL HCL 50 MG PO TABS: 50.0000 mg | ORAL_TABLET | Freq: Four times a day (QID) | ORAL | Status: DC | PRN

## 2016-06-19 MED ORDER — ONDANSETRON HCL 4 MG/2ML IJ SOLN
4.0000 mg | Freq: Four times a day (QID) | INTRAMUSCULAR | Status: DC | PRN
  Administered 2016-06-23: 4 mg via INTRAVENOUS
  Filled 2016-06-19: qty 2

## 2016-06-19 MED ORDER — PANTOPRAZOLE SODIUM 40 MG PO TBEC
40.0000 mg | DELAYED_RELEASE_TABLET | Freq: Two times a day (BID) | ORAL | Status: DC
  Administered 2016-06-19 – 2016-06-23 (×7): 40 mg via ORAL
  Filled 2016-06-19 (×7): qty 1

## 2016-06-19 MED ORDER — ASPIRIN EC 81 MG PO TBEC
81.0000 mg | DELAYED_RELEASE_TABLET | Freq: Every morning | ORAL | Status: DC
  Administered 2016-06-20 – 2016-06-23 (×4): 81 mg via ORAL
  Filled 2016-06-19 (×4): qty 1

## 2016-06-19 MED ORDER — PIPERACILLIN-TAZOBACTAM 3.375 G IVPB 30 MIN
3.3750 g | Freq: Once | INTRAVENOUS | Status: AC
  Administered 2016-06-19: 3.375 g via INTRAVENOUS
  Filled 2016-06-19: qty 50

## 2016-06-19 MED ORDER — ACETAMINOPHEN 325 MG PO TABS: 650.0000 mg | ORAL_TABLET | Freq: Four times a day (QID) | ORAL | Status: DC | PRN

## 2016-06-19 MED ORDER — VANCOMYCIN HCL IN DEXTROSE 1-5 GM/200ML-% IV SOLN
1000.0000 mg | Freq: Once | INTRAVENOUS | Status: AC
  Administered 2016-06-19: 1000 mg via INTRAVENOUS
  Filled 2016-06-19: qty 200

## 2016-06-19 MED ORDER — IBUPROFEN 800 MG PO TABS
800.0000 mg | ORAL_TABLET | Freq: Once | ORAL | Status: AC
  Administered 2016-06-19: 800 mg via ORAL
  Filled 2016-06-19: qty 1

## 2016-06-19 MED ORDER — ONDANSETRON HCL 4 MG PO TABS: 4.0000 mg | ORAL_TABLET | Freq: Four times a day (QID) | ORAL | Status: DC | PRN

## 2016-06-19 MED ORDER — LEVOFLOXACIN 500 MG PO TABS
500.0000 mg | ORAL_TABLET | Freq: Once | ORAL | Status: AC
  Administered 2016-06-19: 500 mg via ORAL
  Filled 2016-06-19: qty 1

## 2016-06-19 MED ORDER — ATORVASTATIN CALCIUM 20 MG PO TABS
20.0000 mg | ORAL_TABLET | Freq: Every evening | ORAL | Status: DC
  Administered 2016-06-19 – 2016-06-22 (×3): 20 mg via ORAL
  Filled 2016-06-19 (×3): qty 1

## 2016-06-19 MED ORDER — SODIUM CHLORIDE 0.9 % IV SOLN: 30.0000 meq | Freq: Once | INTRAVENOUS | Status: DC

## 2016-06-19 MED ORDER — SODIUM CHLORIDE 0.9 % IV SOLN
INTRAVENOUS | Status: DC
  Administered 2016-06-19 – 2016-06-23 (×7): via INTRAVENOUS

## 2016-06-19 MED ORDER — POTASSIUM CHLORIDE 10 MEQ/100ML IV SOLN
10.0000 meq | INTRAVENOUS | Status: AC
  Administered 2016-06-19 (×3): 10 meq via INTRAVENOUS
  Filled 2016-06-19 (×3): qty 100

## 2016-06-19 MED ORDER — ENOXAPARIN SODIUM 40 MG/0.4ML ~~LOC~~ SOLN
40.0000 mg | SUBCUTANEOUS | Status: DC
  Administered 2016-06-19 – 2016-06-22 (×3): 40 mg via SUBCUTANEOUS
  Filled 2016-06-19 (×3): qty 0.4

## 2016-06-19 MED ORDER — ACETAMINOPHEN 650 MG RE SUPP: 650.0000 mg | Freq: Four times a day (QID) | RECTAL | Status: DC | PRN

## 2016-06-19 MED ORDER — IOPAMIDOL (ISOVUE-300) INJECTION 61%
INTRAVENOUS | Status: AC
  Filled 2016-06-19: qty 30

## 2016-06-19 NOTE — ED Provider Notes (Signed)
[facility]-EM[facility]ENCY DEPT Provider Note   CSN: [id number] Arrival date & time: 06/19/16  1115     History   Chief Complaint No chief complaint on file.  Level V caveat: Nonverbal  HPI [name] is a 66 y.o. male.  HPI Patient is a 66 year old male with a history of diabetes and hypertension who is nonverbal at baseline since birth.  Over the past 24 hours he's had increased generalized weakness and decreased appetite and has fallen twice in the last 24 hours once last night and again today.  Normally walks without difficulty.  No new focal weakness but family reports she just seems weaker than usual.  Possible fever at home.  No other information available.   Past Medical History:  Diagnosis Date  . Diabetes mellitus without complication (HCC)   . Hypertension   . Urinary retention     Patient Active Problem List   Diagnosis Date Noted  . 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 Urinary retention 05/07/2014  . Mental retardation 05/07/2014  . Hyperlipidemia 05/07/2014  . HTN (hypertension) 05/07/2014    History reviewed. No pertinent surgical history.     Home Medications    Prior to Admission medications   Medication Sig Start Date End Date Taking? Authorizing Provider  aspirin EC 81 MG tablet Take 81 mg by mouth every morning.    Yes Historical Provider, MD  atorvastatin (LIPITOR) 20 MG tablet Take 20 mg by mouth every evening.    Yes Historical Provider, MD  lisinopril (PRINIVIL,ZESTRIL) 10 MG tablet Take 10 mg by mouth daily.   Yes Historical Provider, MD  metFORMIN (GLUCOPHAGE-XR) 500 MG 24 hr tablet Take 2 tablets by mouth every morning.  04/22/14  Yes Historical Provider, MD  pantoprazole (PROTONIX) 40 MG tablet Take 1 tablet (40 mg total) by mouth 2 (two) times daily before a meal. 05/11/14  Yes Angus McInnis, MD  tamsulosin (FLOMAX) 0.4 MG CAPS capsule Take 0.4 mg by mouth daily.   Yes Historical Provider, MD    Family History No family history on  file.  Social History Social History  Substance Use Topics  . Smoking status: Never Smoker  . Smokeless tobacco: Never Used  . Alcohol use No     Allergies   Patient has no known allergies.   Review of Systems Review of Systems  Unable to perform ROS: Patient nonverbal     Physical Exam Updated Vital Signs BP 128/65 (BP Location: Left Arm)   Pulse 87   Temp (!) 103.1 F (39.5 C) (Oral)   Resp 20   Ht 5\' 10"  (1.778 m)   Wt 160 lb (72.6 kg)   SpO2 94%   BMI 22.96 kg/m   Physical Exam  Constitutional: He appears well-developed and well-nourished.  HENT:  Head: Normocephalic and atraumatic.  Eyes: EOM are normal.  Neck: Normal range of motion.  Cardiovascular: Normal rate, regular rhythm and normal heart sounds.   Pulmonary/Chest: Effort normal and breath sounds normal. No respiratory distress.  Abdominal: Soft. He exhibits no distension.  Easily reducible large ventral hernia  Musculoskeletal: Normal range of motion.  Neurological: He is alert.  Follows commands.  Moves all 4 some is equally.  Skin: Skin is warm and dry.  Psychiatric: He has a normal mood and affect. Judgment normal.  Nursing note and vitals reviewed.    ED Treatments / Results  Labs (all labs ordered are listed, but only abnormal results are displayed) Labs Reviewed  CBC - Abnormal; Notable for  the following:       Result Value   RBC 3.94 (*)    Hemoglobin 11.7 (*)    HCT 35.6 (*)    Platelets 115 (*)    All other components within normal limits  COMPREHENSIVE METABOLIC PANEL - Abnormal; Notable for the following:    Sodium 133 (*)    Potassium 3.4 (*)    Chloride 98 (*)    Glucose, Bld 135 (*)    BUN 26 (*)    Creatinine, Ser 1.47 (*)    Calcium 8.7 (*)    ALT 14 (*)    GFR calc non Af Amer 48 (*)    GFR calc Af Amer 56 (*)    All other components within normal limits  CULTURE, BLOOD (ROUTINE X 2)  CULTURE, BLOOD (ROUTINE X 2)  LIPASE, BLOOD  URINALYSIS, ROUTINE W REFLEX  MICROSCOPIC  INFLUENZA PANEL BY PCR (TYPE A & B)  I-STAT CG4 LACTIC ACID, ED   BUN  Date Value Ref Range Status  06/19/2016 26 (H) 6 - 20 mg/dL Final  21/30/8657 8 6 - 23 mg/dL Final  84/69/6295 15 6 - 23 mg/dL Final    Comment:    DELTA CHECK NOTED  05/07/2014 26 (H) 6 - 23 mg/dL Final   Creatinine, Ser  Date Value Ref Range Status  06/19/2016 1.47 (H) 0.61 - 1.24 mg/dL Final  28/41/3244 0.10 0.50 - 1.35 mg/dL Final  27/25/3664 4.03 0.50 - 1.35 mg/dL Final  47/42/5956 3.87 0.50 - 1.35 mg/dL Final     EKG  EKG Interpretation None       Radiology Dg Chest 2 View  Result Date: 06/19/2016 CLINICAL DATA:  Weakness and fever EXAM: CHEST  2 VIEW COMPARISON:  Chest x-ray of May 07, 2014 FINDINGS: The lungs remain hypoinflated. There are coarse lung markings at both bases which are slightly more conspicuous today. The cardiac silhouette remains enlarged. The central pulmonary vascularity is prominent and there is mild cephalization of the vascular pattern. Within the upper abdomen there are loops of moderately distended gas-filled bowel. The degree of distention is not as great as that seen on the previous study. IMPRESSION: Bilateral hypoinflation. CHF with mild pulmonary vascular congestion. Probable bibasilar subsegmental atelectasis. If the patient's clinical findings suggest pneumonia, followup PA and lateral chest X-ray would be recommended in 3-4 weeks following trial of antibiotic therapy to ensure resolution and exclude underlying malignancy. Electronically Signed   By: David  [place] M.D.   On: 06/19/2016 14:28    Procedures Procedures (including critical care time)  Medications Ordered in ED Medications  sodium chloride 0.9 % bolus 1,000 mL (0 mLs Intravenous Stopped 06/19/16 1447)  levofloxacin (LEVAQUIN) tablet 500 mg (500 mg Oral Given 06/19/16 1557)  acetaminophen (TYLENOL) tablet 1,000 mg (1,000 mg Oral Given 06/19/16 1557)     Initial Impression / Assessment and  Plan / ED Course  I have reviewed the triage vital signs and the nursing notes.  Pertinent labs & imaging results that were available during my care of the patient were reviewed by me and considered in my medical decision making (see chart for details).     Patient spiked a fever to 103.1 in the emergency department.  Difficulty ambulating and unsteady on his feet.  Patient be given broad-spectrum antibiotics this time.  Restoril pneumonia.  Urine sample still pending.  Blood cultures pending.  We'll add lactate at this time admitted to the hospital.  Mild acute kidney injury as compared to  his baseline.  Final Clinical Impressions(s) / ED Diagnoses   Final diagnoses:  None    New Prescriptions New Prescriptions   No medications on file     Azalia Bilis, MD 06/19/16 1610

## 2016-06-19 NOTE — H&P (Signed)
History and Physical    [name] ZOX:[id number] DOB: 22-Jul-1950 DOA: 06/19/2016  PCP: Pearson Grippe, MD   Patient coming from: Home  Chief Complaint: Per HCPOA, Weakness, Diarrhea, and Fall x2  HPI: [name] is a 66 y.o. male who is non-verbal at baseline since childhood with some Congnitive Delay with a PMH of Hypertension, Hyperlipidemia, Diabetes Mellitus Type 2, BPH and Urinary Retention, Hx of Nephrolithiasis and other comorbids who presented to [facility] ED from home with a cc of Weakness, Diarrhea, poor po intake and Fall x2 in the last 24 hours. Patient is unable to provide a subjective history and only follows simple commands so a meaningful history was unobtainable from patient. Per Aunt, who is his HCPOA, patient has been having diarrhea for the last 2 weeks and has progressively gotten weaker and has not been eating as well. She states he has been complaining of abdominal pain and right sided flank pain and states that his urine has been bloody. No Nausea, Vomiting, SOB, Chest Pain or Cough noted. Per HCPOA, patient usually walks normally and had a fall last night and early this AM when using the restroom. Patient was kicking the wall so the family member checked on him and found him on the floor. No focal weakness per the HCPOA but just generalized weakness. Patient was going to be D/C'd by the EDP but became orthostatic and spiked a temperature of 103 so [facility] was contacted to admit the patient for Generalized Weakness, and Fever.   ED Course: Given Liter Bolus x2, Placed on Broad Spectrum Abx, and Had Blood Cx and Blood work done. CXR was done and CT of Abd/Pelv was ordered but not done.  Review of Systems: A full 10 point Review of Systems was attempted but unobtainable from patient because of his baseline non-verbal status and limited ability to follow simple commands.   Past Medical History:  Diagnosis Date  . Diabetes mellitus without complication (HCC)   . Hypertension    . Urinary retention    SURGICAL HISTORY Unable to obtain from Patient as he is non-verbal and follows only simple commands.  SOCIAL HISTORY  reports that he has never smoked. He has never used smokeless tobacco. He reports that he does not drink alcohol or use drugs.  No Known Allergies  FAMILY HISTORY No family history on file and unobtainable from patient as he is non-verbal.  Prior to Admission medications   Medication Sig Start Date End Date Taking? Authorizing Provider  aspirin EC 81 MG tablet Take 81 mg by mouth every morning.    Yes Historical Provider, MD  atorvastatin (LIPITOR) 20 MG tablet Take 20 mg by mouth every evening.    Yes Historical Provider, MD  lisinopril (PRINIVIL,ZESTRIL) 10 MG tablet Take 10 mg by mouth daily.   Yes Historical Provider, MD  metFORMIN (GLUCOPHAGE-XR) 500 MG 24 hr tablet Take 2 tablets by mouth every morning.  04/22/14  Yes Historical Provider, MD  pantoprazole (PROTONIX) 40 MG tablet Take 1 tablet (40 mg total) by mouth 2 (two) times daily before a meal. 05/11/14  Yes Angus McInnis, MD  tamsulosin (FLOMAX) 0.4 MG CAPS capsule Take 0.4 mg by mouth daily.   Yes Historical Provider, MD   Physical Exam: Vitals:   06/19/16 1300 06/19/16 1330 06/19/16 1545 06/19/16 1630  BP: 111/58 119/69 128/65 128/58  Pulse: 81 96 87 85  Resp:   20   Temp:   (!) 103.1 F (39.5 C)  TempSrc:   Oral   SpO2: 99% 98% 94% 100%  Weight:      Height:       Constitutional: NAD and appears calm and comfortable Eyes: Lids and conjunctivae normal, sclerae anicteric  ENMT: External Ears, Nose appear normal. Grossly normal hearing. .  Neck: Appears normal, supple, no cervical masses, normal ROM, no appreciable thyromegaly, no JVD Respiratory: Diminished to auscultation bilaterally, no wheezing, rales, rhonchi or crackles. Normal respiratory effort and patient is not tachypenic. No accessory muscle use.  Cardiovascular: RRR, no murmurs / rubs / gallops. S1 and S2  auscultated. No extremity edema.  Abdomen: Soft, mildy tender, distended due to Large reducible hernia. No masses palpated. No appreciable hepatosplenomegaly. Bowel sounds positive x4.  GU: Deferred. Musculoskeletal: No clubbing / cyanosis of digits/nails. No joint deformity upper and lower extremities.  Skin: No rashes, lesions, ulcers on limited skin evaluation. No induration; Warm and dry.  Neurologic: Limited participation due to non-verbal status but able to follow simple commands. No focal deficits appreciated. Romberg sign cerebellar reflexes not assessed.  Psychiatric: Unable to assess due to Non-verbal status.   Labs on Admission: I have personally reviewed following labs and imaging studies  CBC:  Recent Labs Lab 06/19/16 1344  WBC 9.2  HGB 11.7*  HCT 35.6*  MCV 90.4  PLT 115*   Basic Metabolic Panel:  Recent Labs Lab 06/19/16 1344  NA 133*  K 3.4*  CL 98*  CO2 26  GLUCOSE 135*  BUN 26*  CREATININE 1.47*  CALCIUM 8.7*   GFR: Estimated Creatinine Clearance: 50.8 mL/min (by C-G formula based on SCr of 1.47 mg/dL (H)). Liver Function Tests:  Recent Labs Lab 06/19/16 1344  AST 36  ALT 14*  ALKPHOS 49  BILITOT 1.0  PROT 7.3  ALBUMIN 3.6    Recent Labs Lab 06/19/16 1344  LIPASE 11   No results for input(s): AMMONIA in the last 168 hours. Coagulation Profile: No results for input(s): INR, PROTIME in the last 168 hours. Cardiac Enzymes: No results for input(s): CKTOTAL, CKMB, CKMBINDEX, TROPONINI in the last 168 hours. BNP (last 3 results) No results for input(s): PROBNP in the last 8760 hours. HbA1C: No results for input(s): HGBA1C in the last 72 hours. CBG: No results for input(s): GLUCAP in the last 168 hours. Lipid Profile: No results for input(s): CHOL, HDL, LDLCALC, TRIG, CHOLHDL, LDLDIRECT in the last 72 hours. Thyroid Function Tests: No results for input(s): TSH, T4TOTAL, FREET4, T3FREE, THYROIDAB in the last 72 hours. Anemia Panel: No  results for input(s): VITAMINB12, FOLATE, FERRITIN, TIBC, IRON, RETICCTPCT in the last 72 hours. Urine analysis:    Component Value Date/Time   COLORURINE RED (A) 06/19/2016 1123   APPEARANCEUR HAZY (A) 06/19/2016 1123   LABSPEC 1.010 06/19/2016 1123   PHURINE 8.5 (H) 06/19/2016 1123   GLUCOSEU 100 (A) 06/19/2016 1123   HGBUR LARGE (A) 06/19/2016 1123   BILIRUBINUR MODERATE (A) 06/19/2016 1123   KETONESUR 15 (A) 06/19/2016 1123   PROTEINUR >300 (A) 06/19/2016 1123   UROBILINOGEN 1.0 07/20/2014 1718   NITRITE POSITIVE (A) 06/19/2016 1123   LEUKOCYTESUR LARGE (A) 06/19/2016 1123   Sepsis Labs: !!!!!!!!!!!!!!!!!!!!!!!!!!!!!!!!!!!!!!!!!!!! @LABRCNTIP (procalcitonin:4,lacticidven:4) )No results found for this or any previous visit (from the past 240 hour(s)).   Radiological Exams on Admission: Dg Chest 2 View  Result Date: 06/19/2016 CLINICAL DATA:  Weakness and fever EXAM: CHEST  2 VIEW COMPARISON:  Chest x-ray of May 07, 2014 FINDINGS: The lungs remain hypoinflated. There are coarse lung markings  at both bases which are slightly more conspicuous today. The cardiac silhouette remains enlarged. The central pulmonary vascularity is prominent and there is mild cephalization of the vascular pattern. Within the upper abdomen there are loops of moderately distended gas-filled bowel. The degree of distention is not as great as that seen on the previous study. IMPRESSION: Bilateral hypoinflation. CHF with mild pulmonary vascular congestion. Probable bibasilar subsegmental atelectasis. If the patient's clinical findings suggest pneumonia, followup PA and lateral chest X-ray would be recommended in 3-4 weeks following trial of antibiotic therapy to ensure resolution and exclude underlying malignancy. Electronically Signed   By: David  [place]az[place]and[place] M.D.   On: 06/19/2016 14:28    EKG: No EKG Done in Ed; Will Order.  Assessment/Plan Principal Problem:   Fever Active Problems:   Mental retardation    Hyperlipidemia   HTN (hypertension)   Generalized weakness   Diabetes mellitus type 2, controlled (HCC)   SIRS (systemic inflammatory response syndrome) (HCC)   UTI (urinary tract infection)   Hematuria   AKI (acute kidney injury) (HCC)   Hyponatremia   Hypokalemia   BPH (benign prostatic hyperplasia)  Fever/SIRS suspect GI vs. Urinary Causes -Admit to Inpatient  -Obtain Blood Cx x2 (pending), Sputum Cx, Urinalysis likely indicated UTI and Urine Cx; -Influenza A/B via PCR Negative -Patient complaining of Right Sided Abdominal Pain so will obtain CT Abd/Pelvis w/o Contrast, C Diff via PCR and Pathogen Panel given Diarrhea -Patient's WBC is 9.2 -Given 2 Liters of NS boluses in ED -No Cough, SOB, or Desaturations -Chest X-Ray read as Bilateral hypoinflation. CHF with mild pulmonary vascular congestion. Probable bibasilar subsegmental atelectasis. If the patient's clinical findings suggest pneumonia, followup PA and lateral chest X-ray would be recommended in 3-4 weeks followingtrial of antibiotic therapy to ensure resolution and exclude underlying malignancy. -Empirc Abx given in ED with  Levofloxacin 500 mg po, IV Zosyn, and IV Vancomycin; Will continue IV Levaquin for now and re-evaluate -Lactic Acid Level was 1.83 -Repeat CBC in AM  Generalized Weakness s/p 2 Falls in the Setting of Diarrhea over the last 2 weeks and suspected UTI -Rehydrate with Normal Saline at 100 mL/hr -Will get PT/OT evaluation -Check Orthostatics Tomorrow  Hematuria suspect UTI/Hemorrhagic Cystitis  -Urinalysis showed Many Bacteria, Red Urine, Large Hgb, Large Leukocytes, Postive Nitrites, TNTC RBC, and 6-30 WBC's -Send for Urine Cx -Patient has Hx of Nephrolithasis will get CT of Abdomen and Pelvis to evaluate -Will start Empiric Levofloxacin IV   AKI likley Pre-Renal -Patient's BUN/Cr was elevated at 26/1.47 -Likely from Diarrhea, poor po Intake, and suspected UTI -Avoid Nephrotoxic Medications and  hold Lisinopril -Rehydrate with NS at 100 mL/hr -Repeat CMP in AM -CT Abd/Pelvis to evaluate Kidneys  Hyponatremia/Hypochloremia -Likely from Dehydration -C/w IVF with NS at 100 mL/hr -Repeat CMP in AM   Hypokalemia -Potassium was 3.4 -Replete with IV Kcl Multirun -Repeat CMP in AM  Hypertension -Hold Lisinopril 10 mg po Daily -Continue to Monitor BP's  Hyperlipidemia -C/w Atorvastatin 20 mg po qHS  Diabetes Mellitus Type 2 -Hold Metformin XR 1000 mg po Daily -Continue to Monitor Bs; No SSI Insulin yet  BPH/Urinary Retention -Obtain Bladder Scan -C/w Tamsulosin 0.4 mg Daily  DVT prophylaxis: Lovenox 40 mg sq Code Status: FULL CODE Family Communication: Discussed with HCPOA at bedside Disposition Plan: To be determined pending PT Eval Consults called: None Admission status: Inpatient Telemetry    [facility] Hospitalmair Latif Dorthia Tout, D.O. [facility] Pager [phone]  If 7PM-7AM, please contact night-coverage [email or web address] Password [email or web address]  06/19/2016, 5:39 PM

## 2016-06-19 NOTE — Progress Notes (Signed)
Pharmacy Antibiotic Note  [name] is a 66 y.o. male admitted on 06/19/2016 with UTI.  Pharmacy has been consulted for levaquin dosing. He received 500 mg po levaquin in the ED  Plan: Levaquin 500 mg IV q24 hours F/u renal function, cultures and clinical course  Height: 5\' 10"  (177.8 cm) Weight: 160 lb (72.6 kg) IBW/kg (Calculated) : 73  Temp (24hrs), Avg:101 F (38.3 C), Min:99.6 F (37.6 C), Max:103.1 F (39.5 C)   Recent Labs Lab 06/19/16 1344 06/19/16 1633  WBC 9.2  --   CREATININE 1.47*  --   LATICACIDVEN  --  1.83    Estimated Creatinine Clearance: 50.8 mL/min (by C-G formula based on SCr of 1.47 mg/dL (H)).    No Known Allergies  Antimicrobials this admission: vanc 3/6 for 1 dose Zosyn  3/6  For 1 dose levaquin 3/6 >>    Thank you for allowing pharmacy to be a part of this patient's care.  Talbert CageSeay, Chiann Goffredo Poteet 06/19/2016 5:21 PM

## 2016-06-19 NOTE — ED Notes (Signed)
Pt is non verbal, answer yes/no questions by shaking/noddding head. Pt aunt who is his guardian reports diarrhea x 2 weeks, with poor appetite and generalized weakness since yesterday. Reports 2 falls-one last night and one today. Pt denies injury or pain.

## 2016-06-19 NOTE — ED Triage Notes (Signed)
Pt comes in with family who had a fall last night around 0100. Pt had another fall before coming up here. Pts family states he has had blood in his urine. Pt doesn't talk, that is his baseline. Denies any loss of consciousness. Pt has had diarrhea.

## 2016-06-20 ENCOUNTER — Inpatient Hospital Stay (HOSPITAL_COMMUNITY): Payer: Medicare Other | Admitting: Anesthesiology

## 2016-06-20 ENCOUNTER — Encounter (HOSPITAL_COMMUNITY)
Admission: EM | Disposition: A | Discharge status: Home Health | Payer: Self-pay | Source: Home / Self Care | Attending: Internal Medicine

## 2016-06-20 ENCOUNTER — Inpatient Hospital Stay (HOSPITAL_COMMUNITY): Payer: Medicare Other

## 2016-06-20 DIAGNOSIS — N390 Urinary tract infection, site not specified: Secondary | ICD-10-CM

## 2016-06-20 DIAGNOSIS — A419 Sepsis, unspecified organism: Secondary | ICD-10-CM | POA: Diagnosis not present

## 2016-06-20 DIAGNOSIS — R5081 Fever presenting with conditions classified elsewhere: Secondary | ICD-10-CM

## 2016-06-20 DIAGNOSIS — R509 Fever, unspecified: Secondary | ICD-10-CM | POA: Diagnosis not present

## 2016-06-20 DIAGNOSIS — N401 Enlarged prostate with lower urinary tract symptoms: Secondary | ICD-10-CM

## 2016-06-20 DIAGNOSIS — A4159 Other Gram-negative sepsis: Secondary | ICD-10-CM | POA: Diagnosis not present

## 2016-06-20 DIAGNOSIS — N138 Other obstructive and reflux uropathy: Secondary | ICD-10-CM | POA: Diagnosis not present

## 2016-06-20 DIAGNOSIS — N179 Acute kidney failure, unspecified: Secondary | ICD-10-CM

## 2016-06-20 DIAGNOSIS — I959 Hypotension, unspecified: Secondary | ICD-10-CM | POA: Diagnosis not present

## 2016-06-20 DIAGNOSIS — R000 Tachycardia, unspecified: Secondary | ICD-10-CM | POA: Diagnosis not present

## 2016-06-20 DIAGNOSIS — E878 Other disorders of electrolyte and fluid balance, not elsewhere classified: Secondary | ICD-10-CM | POA: Diagnosis not present

## 2016-06-20 DIAGNOSIS — N132 Hydronephrosis with renal and ureteral calculous obstruction: Secondary | ICD-10-CM

## 2016-06-20 DIAGNOSIS — N202 Calculus of kidney with calculus of ureter: Secondary | ICD-10-CM | POA: Diagnosis not present

## 2016-06-20 HISTORY — PX: CYSTOSCOPY W/ URETERAL STENT PLACEMENT: SHX1429

## 2016-06-20 LAB — CBC
HEMATOCRIT: 36.5 % — AB (ref 39.0–52.0)
Hemoglobin: 12 g/dL — ABNORMAL LOW (ref 13.0–17.0)
MCH: 29.5 pg (ref 26.0–34.0)
MCHC: 32.9 g/dL (ref 30.0–36.0)
MCV: 89.7 fL (ref 78.0–100.0)
PLATELETS: 102 10*3/uL — AB (ref 150–400)
RBC: 4.07 MIL/uL — ABNORMAL LOW (ref 4.22–5.81)
RDW: 13.5 % (ref 11.5–15.5)
WBC: 6 10*3/uL (ref 4.0–10.5)

## 2016-06-20 LAB — C DIFFICILE QUICK SCREEN W PCR REFLEX
C Diff antigen: NEGATIVE
C Diff interpretation: NOT DETECTED
C Diff toxin: NEGATIVE

## 2016-06-20 LAB — BLOOD CULTURE ID PANEL (REFLEXED)
Acinetobacter baumannii: NOT DETECTED
CANDIDA PARAPSILOSIS: NOT DETECTED
CARBAPENEM RESISTANCE: NOT DETECTED
Candida albicans: NOT DETECTED
Candida glabrata: NOT DETECTED
Candida krusei: NOT DETECTED
Candida tropicalis: NOT DETECTED
ENTEROBACTERIACEAE SPECIES: DETECTED — AB
ENTEROCOCCUS SPECIES: NOT DETECTED
Enterobacter cloacae complex: NOT DETECTED
Escherichia coli: NOT DETECTED
HAEMOPHILUS INFLUENZAE: NOT DETECTED
KLEBSIELLA PNEUMONIAE: DETECTED — AB
Klebsiella oxytoca: NOT DETECTED
Listeria monocytogenes: NOT DETECTED
Neisseria meningitidis: NOT DETECTED
PSEUDOMONAS AERUGINOSA: NOT DETECTED
Proteus species: NOT DETECTED
STAPHYLOCOCCUS AUREUS BCID: NOT DETECTED
STREPTOCOCCUS PYOGENES: NOT DETECTED
STREPTOCOCCUS SPECIES: NOT DETECTED
Serratia marcescens: NOT DETECTED
Staphylococcus species: NOT DETECTED
Streptococcus agalactiae: NOT DETECTED
Streptococcus pneumoniae: NOT DETECTED

## 2016-06-20 LAB — COMPREHENSIVE METABOLIC PANEL
ALBUMIN: 3.1 g/dL — AB (ref 3.5–5.0)
ALT: 16 U/L — AB (ref 17–63)
AST: 42 U/L — AB (ref 15–41)
Alkaline Phosphatase: 41 U/L (ref 38–126)
Anion gap: 9 (ref 5–15)
BILIRUBIN TOTAL: 1.3 mg/dL — AB (ref 0.3–1.2)
BUN: 32 mg/dL — AB (ref 6–20)
CALCIUM: 8 mg/dL — AB (ref 8.9–10.3)
CO2: 22 mmol/L (ref 22–32)
CREATININE: 1.68 mg/dL — AB (ref 0.61–1.24)
Chloride: 103 mmol/L (ref 101–111)
GFR calc Af Amer: 47 mL/min — ABNORMAL LOW (ref >60)
GFR calc non Af Amer: 41 mL/min — ABNORMAL LOW (ref >60)
GLUCOSE: 135 mg/dL — AB (ref 65–99)
Potassium: 3.3 mmol/L — ABNORMAL LOW (ref 3.5–5.1)
Sodium: 134 mmol/L — ABNORMAL LOW (ref 135–145)
TOTAL PROTEIN: 6.6 g/dL (ref 6.5–8.1)

## 2016-06-20 LAB — MAGNESIUM: MAGNESIUM: 1.4 mg/dL — AB (ref 1.7–2.4)

## 2016-06-20 LAB — GLUCOSE, CAPILLARY
GLUCOSE-CAPILLARY: 136 mg/dL — AB (ref 65–99)
GLUCOSE-CAPILLARY: 112 mg/dL — AB (ref 65–99)
Glucose-Capillary: 142 mg/dL — ABNORMAL HIGH (ref 65–99)
Glucose-Capillary: 150 mg/dL — ABNORMAL HIGH (ref 65–99)

## 2016-06-20 SURGERY — CYSTOSCOPY, WITH RETROGRADE PYELOGRAM AND URETERAL STENT INSERTION
Anesthesia: General | Site: Ureter | Laterality: Bilateral

## 2016-06-20 MED ORDER — STERILE WATER FOR IRRIGATION IR SOLN
Status: DC | PRN
  Administered 2016-06-20: 3000 mL

## 2016-06-20 MED ORDER — POTASSIUM CHLORIDE CRYS ER 20 MEQ PO TBCR
40.0000 meq | EXTENDED_RELEASE_TABLET | Freq: Once | ORAL | Status: DC
  Filled 2016-06-20 (×2): qty 2

## 2016-06-20 MED ORDER — FENTANYL CITRATE (PF) 100 MCG/2ML IJ SOLN
INTRAMUSCULAR | Status: DC | PRN
  Administered 2016-06-20: 25 ug via INTRAVENOUS

## 2016-06-20 MED ORDER — FENTANYL CITRATE (PF) 100 MCG/2ML IJ SOLN
25.0000 ug | INTRAMUSCULAR | Status: DC | PRN
  Administered 2016-06-20: 25 ug via INTRAVENOUS
  Filled 2016-06-20: qty 2

## 2016-06-20 MED ORDER — INSULIN ASPART 100 UNIT/ML ~~LOC~~ SOLN
3.0000 [IU] | Freq: Three times a day (TID) | SUBCUTANEOUS | Status: DC
  Administered 2016-06-21 – 2016-06-23 (×5): 3 [IU] via SUBCUTANEOUS
  Filled 2016-06-20: qty 0.03

## 2016-06-20 MED ORDER — INSULIN ASPART 100 UNIT/ML ~~LOC~~ SOLN
0.0000 [IU] | Freq: Every day | SUBCUTANEOUS | Status: DC
Start: 2016-06-20 — End: 2016-06-23
  Filled 2016-06-20: qty 0.05

## 2016-06-20 MED ORDER — SODIUM CHLORIDE 0.9 % IJ SOLN
INTRAMUSCULAR | Status: AC
  Filled 2016-06-20: qty 10

## 2016-06-20 MED ORDER — INSULIN ASPART 100 UNIT/ML ~~LOC~~ SOLN
0.0000 [IU] | Freq: Three times a day (TID) | SUBCUTANEOUS | Status: DC
Start: 2016-06-20 — End: 2016-06-23
  Administered 2016-06-20 – 2016-06-23 (×4): 1 [IU] via SUBCUTANEOUS
  Filled 2016-06-20: qty 0.09

## 2016-06-20 MED ORDER — SUCCINYLCHOLINE CHLORIDE 20 MG/ML IJ SOLN
INTRAMUSCULAR | Status: AC
  Filled 2016-06-20: qty 1

## 2016-06-20 MED ORDER — EPHEDRINE SULFATE 50 MG/ML IJ SOLN
INTRAMUSCULAR | Status: AC
  Filled 2016-06-20: qty 1

## 2016-06-20 MED ORDER — DIATRIZOATE MEGLUMINE 30 % UR SOLN
URETHRAL | Status: DC | PRN
  Administered 2016-06-20: 12 mL via URETHRAL

## 2016-06-20 MED ORDER — PROPOFOL 10 MG/ML IV BOLUS
INTRAVENOUS | Status: DC | PRN
  Administered 2016-06-20: 90 mg via INTRAVENOUS

## 2016-06-20 MED ORDER — MIDAZOLAM HCL 2 MG/2ML IJ SOLN
1.0000 mg | INTRAMUSCULAR | Status: DC
  Administered 2016-06-20: 2 mg via INTRAVENOUS

## 2016-06-20 MED ORDER — DIATRIZOATE MEGLUMINE 30 % UR SOLN
URETHRAL | Status: AC
  Filled 2016-06-20: qty 300

## 2016-06-20 MED ORDER — MIDAZOLAM HCL 2 MG/2ML IJ SOLN
INTRAMUSCULAR | Status: AC
  Filled 2016-06-20: qty 2

## 2016-06-20 MED ORDER — FENTANYL CITRATE (PF) 100 MCG/2ML IJ SOLN
INTRAMUSCULAR | Status: AC
  Filled 2016-06-20: qty 2

## 2016-06-20 MED ORDER — SUCCINYLCHOLINE CHLORIDE 20 MG/ML IJ SOLN
INTRAMUSCULAR | Status: DC | PRN
  Administered 2016-06-20: 140 mg via INTRAVENOUS

## 2016-06-20 MED ORDER — PROPOFOL 10 MG/ML IV BOLUS
INTRAVENOUS | Status: AC
  Filled 2016-06-20: qty 20

## 2016-06-20 MED ORDER — EPHEDRINE SULFATE 50 MG/ML IJ SOLN
INTRAMUSCULAR | Status: DC | PRN
  Administered 2016-06-20 (×2): 10 mg via INTRAVENOUS

## 2016-06-20 MED ORDER — LACTATED RINGERS IV SOLN
INTRAVENOUS | Status: DC
  Administered 2016-06-20 (×2): via INTRAVENOUS

## 2016-06-20 MED ORDER — LIDOCAINE HCL 1 % IJ SOLN
INTRAMUSCULAR | Status: DC | PRN
  Administered 2016-06-20: 25 mg via INTRADERMAL

## 2016-06-20 SURGICAL SUPPLY — 23 items
BAG DRAIN URO TABLE W/ADPT NS (DRAPE) ×3 IMPLANT
BAG DRN 8 ADPR NS SKTRN CSTL (DRAPE) ×1
BAG HAMPER (MISCELLANEOUS) ×3 IMPLANT
CATH FOLEY 2WAY SLVR  5CC 16FR (CATHETERS) ×2
CATH FOLEY 2WAY SLVR 5CC 16FR (CATHETERS) ×1 IMPLANT
CATH INTERMIT  6FR 70CM (CATHETERS) ×3 IMPLANT
CLOTH BEACON ORANGE TIMEOUT ST (SAFETY) ×3 IMPLANT
DECANTER SPIKE VIAL GLASS SM (MISCELLANEOUS) ×3 IMPLANT
EXTRACTOR STONE NITINOL NGAGE (UROLOGICAL SUPPLIES) IMPLANT
GLOVE BIO SURGEON STRL SZ8 (GLOVE) ×3 IMPLANT
GOWN STRL REUS W/ TWL XL LVL3 (GOWN DISPOSABLE) ×1 IMPLANT
GOWN STRL REUS W/TWL LRG LVL3 (GOWN DISPOSABLE) ×3 IMPLANT
GOWN STRL REUS W/TWL XL LVL3 (GOWN DISPOSABLE) ×2
GUIDEWIRE STR DUAL SENSOR (WIRE) ×3 IMPLANT
GUIDEWIRE STR ZIPWIRE 035X150 (MISCELLANEOUS) ×3 IMPLANT
IV NS IRRIG 3000ML ARTHROMATIC (IV SOLUTION) ×3 IMPLANT
KIT ROOM TURNOVER AP CYSTO (KITS) ×3 IMPLANT
MANIFOLD NEPTUNE II (INSTRUMENTS) ×3 IMPLANT
PACK CYSTO (CUSTOM PROCEDURE TRAY) ×3 IMPLANT
PAD ARMBOARD 7.5X6 YLW CONV (MISCELLANEOUS) ×3 IMPLANT
STENT URET 6FRX26 CONTOUR (STENTS) ×6 IMPLANT
SYRINGE 10CC LL (SYRINGE) ×3 IMPLANT
TOWEL OR 17X26 4PK STRL BLUE (TOWEL DISPOSABLE) ×3 IMPLANT

## 2016-06-20 NOTE — Consult Note (Signed)
Urology Consult  Referring physician: Dr. Jerilee Hoh Reason for referral: sepsis, ureteral calculus  Chief Complaint: weakness, abdominal pain  History of Present Illness:  [name] is 66 y.o. non-verbal at baseline since childhood with some Congnitive Delay with a PMH of Hypertension, Hyperlipidemia, Diabetes Mellitus Type 2, BPH and Urinary Retention, Hx of Nephrolithiasis presented to [facility] ER with weakness and fever to 103. He was found to have a 1cm right proximal ureteral calculus and multiple left renal pelvic calculi. WBC count 6. Pt is on broad spectrum antibiotics. He is hypotensive and mildly tachycardic  Past Medical History:  Diagnosis Date  . Diabetes mellitus without complication ([place])   . Hypertension   . Urinary retention    History reviewed. No pertinent surgical history.  Medications: I have reviewed the patient's current medications. Allergies: No Known Allergies  No family history on file. Social History:  reports that he has never smoked. He has never used smokeless tobacco. He reports that he does not drink alcohol or use drugs.  Review of Systems  Constitutional: Positive for fever and malaise/fatigue.  Gastrointestinal: Positive for abdominal pain.  All other systems reviewed and are negative.   Physical Exam:  Vital signs in last 24 hours: Temp:  [97.8 F (36.6 C)-103.1 F (39.5 C)] 97.8 F (36.6 C) (03/07 1202) Pulse Rate:  [73-96] 77 (03/07 0635) Resp:  [18-20] 18 (03/07 0635) BP: (87-128)/(53-69) 99/62 (03/07 0635) SpO2:  [94 %-100 %] 95 % (03/07 0635) Weight:  [72.4 kg (159 lb 9.8 oz)-72.5 kg (159 lb 12.8 oz)] 72.4 kg (159 lb 9.8 oz) (03/07 0865) Physical Exam  Constitutional: He appears well-developed and well-nourished.  HENT:  Head: Normocephalic and atraumatic.  Eyes: EOM are normal. Pupils are equal, round, and reactive to light.  Neck: Normal range of motion. No thyromegaly present.  Cardiovascular: Normal rate and regular rhythm.    Respiratory: Effort normal and breath sounds normal.  GI: Soft. He exhibits no distension.  Musculoskeletal: Normal range of motion. He exhibits no edema.  Neurological: He is alert.  Skin: Skin is warm and dry.  Psychiatric: He has a normal mood and affect.    Laboratory Data:  Results for orders placed or performed during the hospital encounter of 06/19/16 (from the past 72 hour(s))  Urinalysis, Routine w reflex microscopic     Status: Abnormal   Collection Time: 06/19/16 11:23 AM  Result Value Ref Range   Color, Urine RED (A) YELLOW    Comment: BIOCHEMICALS MAY BE AFFECTED BY COLOR   APPearance HAZY (A) CLEAR   Specific Gravity, Urine 1.010 1.005 - 1.030   pH 8.5 (H) 5.0 - 8.0   Glucose, UA 100 (A) NEGATIVE mg/dL   Hgb urine dipstick LARGE (A) NEGATIVE   Bilirubin Urine MODERATE (A) NEGATIVE   Ketones, ur 15 (A) NEGATIVE mg/dL   Protein, ur >300 (A) NEGATIVE mg/dL   Nitrite POSITIVE (A) NEGATIVE   Leukocytes, UA LARGE (A) NEGATIVE  Urinalysis, Microscopic (reflex)     Status: Abnormal   Collection Time: 06/19/16 11:23 AM  Result Value Ref Range   RBC / HPF TOO NUMEROUS TO COUNT 0 - 5 RBC/hpf   WBC, UA 6-30 0 - 5 WBC/hpf   Bacteria, UA MANY (A) NONE SEEN   Squamous Epithelial / LPF 0-5 (A) NONE SEEN  CBC     Status: Abnormal   Collection Time: 06/19/16  1:44 PM  Result Value Ref Range   WBC 9.2 4.0 - 10.5 K/uL   RBC  3.94 (L) 4.22 - 5.81 MIL/uL   Hemoglobin 11.7 (L) 13.0 - 17.0 g/dL   HCT 35.6 (L) 39.0 - 52.0 %   MCV 90.4 78.0 - 100.0 fL   MCH 29.7 26.0 - 34.0 pg   MCHC 32.9 30.0 - 36.0 g/dL   RDW 13.4 11.5 - 15.5 %   Platelets 115 (L) 150 - 400 K/uL    Comment: PLATELET COUNT CONFIRMED BY SMEAR  Comprehensive metabolic panel     Status: Abnormal   Collection Time: 06/19/16  1:44 PM  Result Value Ref Range   Sodium 133 (L) 135 - 145 mmol/L   Potassium 3.4 (L) 3.5 - 5.1 mmol/L   Chloride 98 (L) 101 - 111 mmol/L   CO2 26 22 - 32 mmol/L   Glucose, Bld 135 (H) 65 -  99 mg/dL   BUN 26 (H) 6 - 20 mg/dL   Creatinine, Ser 1.47 (H) 0.61 - 1.24 mg/dL   Calcium 8.7 (L) 8.9 - 10.3 mg/dL   Total Protein 7.3 6.5 - 8.1 g/dL   Albumin 3.6 3.5 - 5.0 g/dL   AST 36 15 - 41 U/L   ALT 14 (L) 17 - 63 U/L   Alkaline Phosphatase 49 38 - 126 U/L   Total Bilirubin 1.0 0.3 - 1.2 mg/dL   GFR calc non Af Amer 48 (L) >60 mL/min   GFR calc Af Amer 56 (L) >60 mL/min    Comment: (NOTE) The eGFR has been calculated using the CKD EPI equation. This calculation has not been validated in all clinical situations. eGFR's persistently <60 mL/min signify possible Chronic Kidney Disease.    Anion gap 9 5 - 15  Lipase, blood     Status: None   Collection Time: 06/19/16  1:44 PM  Result Value Ref Range   Lipase 11 11 - 51 U/L  Influenza panel by PCR (type A & B)     Status: None   Collection Time: 06/19/16  4:03 PM  Result Value Ref Range   Influenza A By PCR NEGATIVE NEGATIVE   Influenza B By PCR NEGATIVE NEGATIVE    Comment: (NOTE) The Xpert Xpress Flu assay is intended as an aid in the diagnosis of  influenza and should not be used as a sole basis for treatment.  This  assay is FDA approved for nasopharyngeal swab specimens only. Nasal  washings and aspirates are unacceptable for Xpert Xpress Flu testing.   Blood culture (routine x 2)     Status: None (Preliminary result)   Collection Time: 06/19/16  4:09 PM  Result Value Ref Range   Specimen Description BLOOD    Special Requests NONE    Culture NO GROWTH < 24 HOURS    Report Status PENDING   Blood culture (routine x 2)     Status: None (Preliminary result)   Collection Time: 06/19/16  4:22 PM  Result Value Ref Range   Specimen Description BLOOD    Special Requests NONE    Culture  Setup Time      GRAM NEGATIVE RODS Gram Stain Report Called to,Read Back By and Verified With: TETREAULT H. AT [id number] ON [id number] BY THOMPSON S. Organism ID to follow Performed at [facility], [place] [address] [place], [place]  [address]    Culture NO GROWTH < 24 HOURS    Report Status PENDING   I-Stat CG4 Lactic Acid, ED     Status: None   Collection Time: 06/19/16  4:33 PM  Result  Value Ref Range   Lactic Acid, Venous 1.83 0.5 - 1.9 mmol/L  TSH     Status: None   Collection Time: 06/19/16  7:18 PM  Result Value Ref Range   TSH 1.008 0.350 - 4.500 uIU/mL    Comment: Performed by a 3rd Generation assay with a functional sensitivity of <=0.01 uIU/mL.  Comprehensive metabolic panel     Status: Abnormal   Collection Time: 06/20/16  5:24 AM  Result Value Ref Range   Sodium 134 (L) 135 - 145 mmol/L   Potassium 3.3 (L) 3.5 - 5.1 mmol/L   Chloride 103 101 - 111 mmol/L   CO2 22 22 - 32 mmol/L   Glucose, Bld 135 (H) 65 - 99 mg/dL   BUN 32 (H) 6 - 20 mg/dL   Creatinine, Ser 1.68 (H) 0.61 - 1.24 mg/dL   Calcium 8.0 (L) 8.9 - 10.3 mg/dL   Total Protein 6.6 6.5 - 8.1 g/dL   Albumin 3.1 (L) 3.5 - 5.0 g/dL   AST 42 (H) 15 - 41 U/L   ALT 16 (L) 17 - 63 U/L   Alkaline Phosphatase 41 38 - 126 U/L   Total Bilirubin 1.3 (H) 0.3 - 1.2 mg/dL   GFR calc non Af Amer 41 (L) >60 mL/min   GFR calc Af Amer 47 (L) >60 mL/min    Comment: (NOTE) The eGFR has been calculated using the CKD EPI equation. This calculation has not been validated in all clinical situations. eGFR's persistently <60 mL/min signify possible Chronic Kidney Disease.    Anion gap 9 5 - 15  CBC     Status: Abnormal   Collection Time: 06/20/16  5:24 AM  Result Value Ref Range   WBC 6.0 4.0 - 10.5 K/uL   RBC 4.07 (L) 4.22 - 5.81 MIL/uL   Hemoglobin 12.0 (L) 13.0 - 17.0 g/dL   HCT 36.5 (L) 39.0 - 52.0 %   MCV 89.7 78.0 - 100.0 fL   MCH 29.5 26.0 - 34.0 pg   MCHC 32.9 30.0 - 36.0 g/dL   RDW 13.5 11.5 - 15.5 %   Platelets 102 (L) 150 - 400 K/uL    Comment: SPECIMEN CHECKED FOR CLOTS PLATELET COUNT CONFIRMED BY SMEAR   C difficile quick scan w PCR reflex     Status: None   Collection Time: 06/20/16  9:07 AM  Result Value Ref Range   C Diff antigen  NEGATIVE NEGATIVE   C Diff toxin NEGATIVE NEGATIVE   C Diff interpretation No C. difficile detected.    Recent Results (from the past 240 hour(s))  Blood culture (routine x 2)     Status: None (Preliminary result)   Collection Time: 06/19/16  4:09 PM  Result Value Ref Range Status   Specimen Description BLOOD  Final   Special Requests NONE  Final   Culture NO GROWTH < 24 HOURS  Final   Report Status PENDING  Incomplete  Blood culture (routine x 2)     Status: None (Preliminary result)   Collection Time: 06/19/16  4:22 PM  Result Value Ref Range Status   Specimen Description BLOOD  Final   Special Requests NONE  Final   Culture  Setup Time   Final    GRAM NEGATIVE RODS Gram Stain Report Called to,Read Back By and Verified With: TETREAULT H. AT [id number] ON [id number] BY THOMPSON S. Organism ID to follow Performed at [facility], [place] [address], [place], [place] [address]  Culture NO GROWTH < 24 HOURS  Final   Report Status PENDING  Incomplete  C difficile quick scan w PCR reflex     Status: None   Collection Time: 06/20/16  9:07 AM  Result Value Ref Range Status   C Diff antigen NEGATIVE NEGATIVE Final   C Diff toxin NEGATIVE NEGATIVE Final   C Diff interpretation No C. difficile detected.  Final   Creatinine:  Recent Labs  06/19/16 1344 06/20/16 0524  CREATININE 1.47* 1.68*   Baseline Creatinine: unknwon  Impression/Assessment:  65yo with right ureteral calculus, left renal calculi and sepsis  Plan:  The risks/benefits/alternatives to bilateral ureteral stent placement was explained to his POA and they understand and wishes to proceed with surgery  Nicolette Bang 06/20/2016, 1:23 PM

## 2016-06-20 NOTE — Anesthesia Postprocedure Evaluation (Signed)
Anesthesia Post Note  Patient: [name]  Procedure(s) Performed: Procedure(s) (LRB): CYSTOSCOPY WITH RETROGRADE PYELOGRAM/URETERAL STENT PLACEMENT (Bilateral)  Patient location during evaluation: PACU Anesthesia Type: General Level of consciousness: awake and alert Pain management: satisfactory to patient Vital Signs Assessment: post-procedure vital signs reviewed and stable Respiratory status: spontaneous breathing and patient connected to nasal cannula oxygen Cardiovascular status: stable Anesthetic complications: no     Last Vitals:  Vitals:   06/20/16 1435 06/20/16 1545  BP: (!) 99/49 (!) 104/45  Pulse:  92  Resp: 11 18  Temp:      Last Pain:  Vitals:   06/20/16 1328  TempSrc: Oral                 Laurina Fischl

## 2016-06-20 NOTE — Evaluation (Addendum)
Occupational Therapy Evaluation Patient Details Name: [name] S Borski MRN: [id number] DOB: Apr 01, 1951 Today's Date: 06/20/2016    History of Present Illness 66 y.o. male who is non-verbal at baseline since childhood with some Congnitive Delay with a PMH of Hypertension, Hyperlipidemia, Diabetes Mellitus Type 2, BPH and Urinary Retention, Hx of Nephrolithiasis and other comorbids who presented to [facility] ED from home with a cc of Weakness, Diarrhea, poor po intake and Fall x2 in the last 24 hours. Patient is unable to provide a subjective history and only follows simple commands so a meaningful history was unobtainable from patient. Per Aunt, who is his HCPOA, patient has been having diarrhea for the last 2 weeks and has progressively gotten weaker and has not been eating as well. She states he has been complaining of abdominal pain and right sided flank pain and states that his urine has been bloody. No Nausea, Vomiting, SOB, Chest Pain or Cough noted. Per HCPOA, patient usually walks normally and had a fall last night and early this AM when using the restroom. Patient was kicking the wall so the family member checked on him and found him on the floor. No focal weakness per the HCPOA but just generalized weakness. Patient was going to be D/C'd by the EDP but became orthostatic and spiked a temperature of 103 so [facility] was contacted to admit the patient for Generalized Weakness, and Fever.   CT of abdomen and pelvis showed 11 x 8 mm stone mid right ureter causing obstruction of the right kidney, and ileus.     Clinical Impression   Patient and Aunt in room upon therapy arrival. Patient was agreeable to participate in evaluation. Patient is typically independent with basic ADL tasks although he does have assistance from his Uncle whom he lives with if needed; more cueing for sequences and steps during bathing. Patient is weak during hospital stay although I believe that once he feels better and returns home  he will recover fairly quickly. I don't see any need for OT services at this time. Patient has a strong support system in place and should be safe to return home with assistance as needed.     Follow Up Recommendations  No OT follow up    Equipment Recommendations  None recommended by OT       Precautions / Restrictions Precautions Precautions: Fall Precaution Comments: 2 falls in the past 6months Restrictions Weight Bearing Restrictions: No      Mobility Bed Mobility      See PT evaluation            Transfers                           ADL Overall ADL's : At baseline;Needs assistance/impaired                                       General ADL Comments: Pt was able to donn hospital socks with close min guard while seated on EOB for safety. Total Assist for toilet hygeine as patient was incontinent of stool and urine when standing/in bed.     Vision Baseline Vision/History:  (Unknown)              Pertinent Vitals/Pain Pain Assessment: Faces Faces Pain Scale: Hurts little more Pain Location: abdomen Pain Intervention(s): Limited activity within patient's tolerance;Repositioned;Monitored during session  Hand Dominance  (unknown)   Extremity/Trunk Assessment Upper Extremity Assessment Upper Extremity Assessment: Generalized weakness   Lower Extremity Assessment Lower Extremity Assessment: Defer to PT evaluation       Communication Communication Communication: Other (comment) (Non-verbal)   Cognition Arousal/Alertness: Awake/alert Behavior During Therapy: WFL for tasks assessed/performed Overall Cognitive Status: History of cognitive impairments - at baseline (Pt is non-verbal due to mental retardation. Pt is able to shake head yes or no)                                Home Living Family/patient expects to be discharged to:: Private residence Living Arrangements: Other relatives (his uncle) Available Help at  Discharge: Available 24 hours/day (Nephew also assists at home.) Type of Home: House Home Access: Stairs to enter [employer] of Steps: 1 step   Home Layout: One level     Bathroom Shower/Tub: Chief Strategy Officer: Standard     Home Equipment: Information systems manager;Wheelchair - manual          Prior Functioning/Environment Level of Independence: Independent  Gait / Transfers Assistance Needed: independent ADL's / Homemaking Assistance Needed: assistance for bathing occasionally, and independent with dressing, and toileting.     Comments: [name] states that he enjoys drawing.                              Co-evaluation PT/OT/SLP Co-Evaluation/Treatment: Yes Reason for Co-Treatment: Necessary to address cognition/behavior during functional activity;To address functional/ADL transfers   OT goals addressed during session: ADL's and self-care;Strengthening/ROM      End of Session Equipment Utilized During Treatment: Gait belt  Activity Tolerance: Patient tolerated treatment well Patient left: Other (comment) (with physical therapist and aunt.)  OT Visit Diagnosis: Muscle weakness (generalized) (M62.81)                ADL either performed or assessed with clinical judgement  Time: [phone] OT Time Calculation (min): 22 min Charges:  OT General Charges $OT Visit: 1 Procedure OT Evaluation $OT Eval Low Complexity: 1 Procedure G-Codes:                   OT G-codes **NOT FOR INPATIENT CLASS**  Functional Assessment Tool Used AM-PAC 6 Clicks Daily Activity  Functional Limitation Self care  Self Care Current Status (N2355) CK  Self Care Goal Status (D3220) CK  Self Care Discharge Status [phone]) CK   Limmie Patricia, OTR/L,CBIS  [phone]   Zael Shuman, Charisse March 06/20/2016, 5:01 PM

## 2016-06-20 NOTE — Care Management Note (Signed)
Case Management Note  Patient Details  Name: [name] MRN: [id number] Date of Birth: 10/30/1950  Subjective/Objective:                  Pt admitted with sepsis. He is ind with ADL's. He has MR and is non-verbal. He has 24/7 supervision from family. Family plans for pt to return home. Pt recommends HH PT. Pt has used [facility] in the past and would like to use them again. HCPOA at bedside to discuss DC planning.   Action/Plan: CM will notify [facility] rep of referral, they will obtain pt info from chart. CM will cont to follow for DC planning needs.   Expected Discharge Date:     06/25/2016             Expected Discharge Plan:  Home/Self Care  In-House Referral:  NA  Discharge planning Services  CM Consult  Post Acute Care Choice:  [facility]  HH Arranged:  PT HH Agency:    [facility]  Status of Service:  In process, will continue to follow   Malcolm MetroChildress, Rockelle Heuerman Demske, RN 06/20/2016, 11:29 AM

## 2016-06-20 NOTE — Anesthesia Preprocedure Evaluation (Addendum)
Anesthesia Evaluation  Patient identified by MRN, date of birth, ID band Patient confused    Reviewed: Allergy & Precautions, NPO status , Patient's Chart, lab work & pertinent test results  Airway Mallampati: III  TM Distance: >3 FB     Dental  (+) Edentulous Upper, Edentulous Lower   Pulmonary neg pulmonary ROS,  CXR: ATX vs early pneumonia   breath sounds clear to auscultation       Cardiovascular hypertension, Pt. on medications  Rhythm:Regular Rate:Normal     Neuro/Psych PSYCHIATRIC DISORDERS (mental retardation , pt is non-communicative)    GI/Hepatic negative GI ROS,   Endo/Other  diabetes, Type 2  Renal/GU Renal disease     Musculoskeletal   Abdominal   Peds  Hematology   Anesthesia Other Findings   Reproductive/Obstetrics                            Anesthesia Physical Anesthesia Plan  ASA: III  Anesthesia Plan: General   Post-op Pain Management:    Induction: Intravenous, Rapid sequence and Cricoid pressure planned  Airway Management Planned: Oral ETT  Additional Equipment:   Intra-op Plan:   Post-operative Plan: Extubation in OR  Informed Consent: I have reviewed the patients History and Physical, chart, labs and discussed the procedure including the risks, benefits and alternatives for the proposed anesthesia with the patient or authorized representative who has indicated his/her understanding and acceptance.     Plan Discussed with:   Anesthesia Plan Comments:         Anesthesia Quick Evaluation

## 2016-06-20 NOTE — Progress Notes (Signed)
BS 136

## 2016-06-20 NOTE — Progress Notes (Addendum)
PROGRESS NOTE    [name]  QIO:[id number] DOB: 1950-05-05 DOA: 06/19/2016 PCP: Pearson Grippe, MD     Brief Narrative:  66 year old man admitted from home on 3/6 due to weakness and falls. He has a cognitive delay and is nonverbal. Has a history of BPH and nephrolithiasis. He was found to have on CT scan a mid right ureter stone causing obstruction of the right kidney.   Assessment & Plan:   Principal Problem:   Hydronephrosis with urinary obstruction due to ureteral calculus Active Problems:   Mental retardation   Hyperlipidemia   HTN (hypertension)   Fever   Generalized weakness   Diabetes mellitus type 2, controlled (HCC)   SIRS (systemic inflammatory response syndrome) (HCC)   UTI (urinary tract infection)   Hematuria   AKI (acute kidney injury) (HCC)   Hyponatremia   Hypokalemia   BPH (benign prostatic hyperplasia)   Sepsis secondary to UTI (HCC)   Sepsis secondary to UTI -Related to obstructing right ureteral stone. -Urology has been consulted and and patient is going for ureteral stents today. -Plan to continue Levaquin pending culture data. -Remains somewhat hypotensive, continue IV fluids.  Generalized weakness -Due to sepsis plus minus diarrhea. -Obtain PT evaluation in a.m.  Acute renal failure -Likely related to ureteral obstruction with stone, continue to monitor status post stent placement.  Hyponatremia, mild -Sodium 134 today, continue IV fluids.  Hypokalemia -Replace orally, check magnesium  Hypertension -BP meds on hold as he remains hypotensive.  Type 2 diabetes -Well-controlled. -Place on sliding scale insulin.  History of BPH -Continue Flomax   DVT prophylaxis: lovenox Code Status: full code Family Communication: sister and aunt (HCPOA) at bedside updated on plan of care and all questions answered Disposition Plan: home when ready  Consultants:   Urology  Procedures:   Ureteral stent placement 3/7.  Antimicrobials:    Anti-infectives    Start     Dose/Rate Route Frequency Ordered Stop   06/20/16 1600  [MAR Hold]  levofloxacin (LEVAQUIN) IVPB 500 mg     (MAR Hold since 06/20/16 1310)   500 mg 100 mL/hr over 60 Minutes Intravenous Every 24 hours 06/19/16 1721     06/19/16 1615  vancomycin (VANCOCIN) IVPB 1000 mg/200 mL premix     1,000 mg 200 mL/hr over 60 Minutes Intravenous  Once 06/19/16 1610 06/19/16 1829   06/19/16 1615  piperacillin-tazobactam (ZOSYN) IVPB 3.375 g     3.375 g 100 mL/hr over 30 Minutes Intravenous  Once 06/19/16 1610 06/19/16 1729   06/19/16 1530  levofloxacin (LEVAQUIN) tablet 500 mg     500 mg Oral  Once 06/19/16 1527 06/19/16 1557       Subjective: Non-verbal, sitting in chair, appears comfortable  Objective: Vitals:   06/20/16 1420 06/20/16 1425 06/20/16 1430 06/20/16 1435  BP: (!) 82/42 (!) 90/42 (!) 89/45 (!) 99/49  Pulse:      Resp: 13 11 11 11   Temp:      TempSrc:      SpO2: 100% 99% 100% 100%  Weight:      Height:        Intake/Output Summary (Last 24 hours) at 06/20/16 1501 Last data filed at 06/20/16 1440  Gross per 24 hour  Intake          2233.33 ml  Output             1400 ml  Net           833.33 ml  Filed Weights   06/19/16 1853 06/20/16 0635 06/20/16 1328  Weight: 72.5 kg (159 lb 12.8 oz) 72.4 kg (159 lb 9.8 oz) 72.1 kg (159 lb)    Examination:  General exam: Alert, awake, oriented x 3 Respiratory system: Clear to auscultation. Respiratory effort normal. Cardiovascular system:RRR. No murmurs, rubs, gallops. Gastrointestinal system: Abdomen is nondistended, soft and nontender. No organomegaly or masses felt. Normal bowel sounds heard. Central nervous system: Alert and oriented. No focal neurological deficits. Extremities: No C/C/E, +pedal pulses Skin: No rashes, lesions or ulcers Psychiatry: Judgement and insight appear normal. Mood & affect appropriate.     Data Reviewed: I have personally reviewed following labs and imaging  studies  CBC:  Recent Labs Lab 06/19/16 1344 06/20/16 0524  WBC 9.2 6.0  HGB 11.7* 12.0*  HCT 35.6* 36.5*  MCV 90.4 89.7  PLT 115* 102*   Basic Metabolic Panel:  Recent Labs Lab 06/19/16 1344 06/20/16 0524  NA 133* 134*  K 3.4* 3.3*  CL 98* 103  CO2 26 22  GLUCOSE 135* 135*  BUN 26* 32*  CREATININE 1.47* 1.68*  CALCIUM 8.7* 8.0*   GFR: Estimated Creatinine Clearance: 44.1 mL/min (by C-G formula based on SCr of 1.68 mg/dL (H)). Liver Function Tests:  Recent Labs Lab 06/19/16 1344 06/20/16 0524  AST 36 42*  ALT 14* 16*  ALKPHOS 49 41  BILITOT 1.0 1.3*  PROT 7.3 6.6  ALBUMIN 3.6 3.1*    Recent Labs Lab 06/19/16 1344  LIPASE 11   No results for input(s): AMMONIA in the last 168 hours. Coagulation Profile: No results for input(s): INR, PROTIME in the last 168 hours. Cardiac Enzymes: No results for input(s): CKTOTAL, CKMB, CKMBINDEX, TROPONINI in the last 168 hours. BNP (last 3 results) No results for input(s): PROBNP in the last 8760 hours. HbA1C: No results for input(s): HGBA1C in the last 72 hours. CBG:  Recent Labs Lab 06/20/16 1346  GLUCAP 136*   Lipid Profile: No results for input(s): CHOL, HDL, LDLCALC, TRIG, CHOLHDL, LDLDIRECT in the last 72 hours. Thyroid Function Tests:  Recent Labs  06/19/16 1918  TSH 1.008   Anemia Panel: No results for input(s): VITAMINB12, FOLATE, FERRITIN, TIBC, IRON, RETICCTPCT in the last 72 hours. Urine analysis:    Component Value Date/Time   COLORURINE RED (A) 06/19/2016 1123   APPEARANCEUR HAZY (A) 06/19/2016 1123   LABSPEC 1.010 06/19/2016 1123   PHURINE 8.5 (H) 06/19/2016 1123   GLUCOSEU 100 (A) 06/19/2016 1123   HGBUR LARGE (A) 06/19/2016 1123   BILIRUBINUR MODERATE (A) 06/19/2016 1123   KETONESUR 15 (A) 06/19/2016 1123   PROTEINUR >300 (A) 06/19/2016 1123   UROBILINOGEN 1.0 07/20/2014 1718   NITRITE POSITIVE (A) 06/19/2016 1123   LEUKOCYTESUR LARGE (A) 06/19/2016 1123   Sepsis  Labs: @LABRCNTIP (procalcitonin:4,lacticidven:4)  ) Recent Results (from the past 240 hour(s))  Blood culture (routine x 2)     Status: None (Preliminary result)   Collection Time: 06/19/16  4:09 PM  Result Value Ref Range Status   Specimen Description BLOOD  Final   Special Requests NONE  Final   Culture NO GROWTH < 24 HOURS  Final   Report Status PENDING  Incomplete  Blood culture (routine x 2)     Status: None (Preliminary result)   Collection Time: 06/19/16  4:22 PM  Result Value Ref Range Status   Specimen Description BLOOD  Final   Special Requests NONE  Final   Culture  Setup Time   Final  GRAM NEGATIVE RODS Gram Stain Report Called to,Read Back By and Verified With: TETREAULT H. AT [id number] ON [id number] BY THOMPSON S. Organism ID to follow Performed at [facility], [address], [place], [place] [address]    Culture NO GROWTH < 24 HOURS  Final   Report Status PENDING  Incomplete  Blood Culture ID Panel (Reflexed)     Status: Abnormal   Collection Time: 06/19/16  4:22 PM  Result Value Ref Range Status   Enterococcus species NOT DETECTED NOT DETECTED Final   Listeria monocytogenes NOT DETECTED NOT DETECTED Final   Staphylococcus species NOT DETECTED NOT DETECTED Final   Staphylococcus aureus NOT DETECTED NOT DETECTED Final   Streptococcus species NOT DETECTED NOT DETECTED Final   Streptococcus agalactiae NOT DETECTED NOT DETECTED Final   Streptococcus pneumoniae NOT DETECTED NOT DETECTED Final   Streptococcus pyogenes NOT DETECTED NOT DETECTED Final   Acinetobacter baumannii NOT DETECTED NOT DETECTED Final   Enterobacteriaceae species DETECTED (A) NOT DETECTED Final    Comment: Enterobacteriaceae represent a large family of gram-negative bacteria, not a single organism. CRITICAL RESULT CALLED TO, READ BACK BY AND VERIFIED WITH: B. [employer].D. 14:50 06/20/16 ([email or web address])    Enterobacter cloacae complex NOT DETECTED NOT DETECTED Final   Escherichia coli NOT DETECTED  NOT DETECTED Final   Klebsiella oxytoca NOT DETECTED NOT DETECTED Final   Klebsiella pneumoniae DETECTED (A) NOT DETECTED Final    Comment: CRITICAL RESULT CALLED TO, READ BACK BY AND VERIFIED WITH: B. [employer].D. 14:50 06/20/16 ([email or web address])    Proteus species NOT DETECTED NOT DETECTED Final   Serratia marcescens NOT DETECTED NOT DETECTED Final   Carbapenem resistance NOT DETECTED NOT DETECTED Final   Haemophilus influenzae NOT DETECTED NOT DETECTED Final   Neisseria meningitidis NOT DETECTED NOT DETECTED Final   Pseudomonas aeruginosa NOT DETECTED NOT DETECTED Final   Candida albicans NOT DETECTED NOT DETECTED Final   Candida glabrata NOT DETECTED NOT DETECTED Final   Candida krusei NOT DETECTED NOT DETECTED Final   Candida parapsilosis NOT DETECTED NOT DETECTED Final   Candida tropicalis NOT DETECTED NOT DETECTED Final    Comment: Performed at [facility], [address] [place], [place] [address]  C difficile quick scan w PCR reflex     Status: None   Collection Time: 06/20/16  9:07 AM  Result Value Ref Range Status   C Diff antigen NEGATIVE NEGATIVE Final   C Diff toxin NEGATIVE NEGATIVE Final   C Diff interpretation No C. difficile detected.  Final         Radiology Studies: Ct Abdomen Pelvis Wo Contrast  Result Date: 06/19/2016 CLINICAL DATA:  Abdominal pain EXAM: CT ABDOMEN AND PELVIS WITHOUT CONTRAST TECHNIQUE: Multidetector CT imaging of the abdomen and pelvis was performed following the standard protocol without IV contrast. COMPARISON:  CT abdomen pelvis 05/07/2014 FINDINGS: Lower chest: Negative Hepatobiliary: Numerous small gallstones. No gallbladder wall thickening. Small calcified granuloma in the liver. No liver mass. Bile ducts nondilated. Pancreas: Negative Spleen: Negative Adrenals/Urinary Tract: Right hydronephrosis and hydroureter. Obstructing stone above the iliac crossing. This stone measures 11 x 8 mm. Numerous small left renal calculi in multiple  calices and renal pelvis without hydronephrosis. No left ureteral stone. Bladder wall is diffusely thickened. This has progressed in the interval. Stomach/Bowel: Dilated colon compatible with ileus. Mild small bowel dilatation. Moderate stool in the left colon. Vascular/Lymphatic: Mild atherosclerotic disease. Reproductive: Negative Other: None Musculoskeletal: Lumbar degenerative changes. Severe spinal stenosis at L4-5  due to spurring. Chronic compression fracture T12 IMPRESSION: 11 x 8 mm stone mid right ureter causing obstruction of the right kidney. Numerous small left renal calculi without obstruction. Extensive bladder wall thickening with progression Cholelithiasis without gallbladder thickening Severe spinal stenosis L4-5 due to bony overgrowth. Ileus Electronically Signed   By: Marlan Palauharles  Clark M.D.   On: 06/19/2016 20:18   Dg Chest 2 View  Result Date: 06/19/2016 CLINICAL DATA:  Weakness and fever EXAM: CHEST  2 VIEW COMPARISON:  Chest x-ray of May 07, 2014 FINDINGS: The lungs remain hypoinflated. There are coarse lung markings at both bases which are slightly more conspicuous today. The cardiac silhouette remains enlarged. The central pulmonary vascularity is prominent and there is mild cephalization of the vascular pattern. Within the upper abdomen there are loops of moderately distended gas-filled bowel. The degree of distention is not as great as that seen on the previous study. IMPRESSION: Bilateral hypoinflation. CHF with mild pulmonary vascular congestion. Probable bibasilar subsegmental atelectasis. If the patient's clinical findings suggest pneumonia, followup PA and lateral chest X-ray would be recommended in 3-4 weeks following trial of antibiotic therapy to ensure resolution and exclude underlying malignancy. Electronically Signed   By: David  Swazil[place] M.D.   On: 06/19/2016 14:28        Scheduled Meds: . [MAR Hold] aspirin EC  81 mg Oral q morning - 10a  . [MAR Hold] atorvastatin   20 mg Oral QPM  . [MAR Hold] enoxaparin (LOVENOX) injection  40 mg Subcutaneous Q24H  . insulin aspart  0-5 Units Subcutaneous QHS  . insulin aspart  0-9 Units Subcutaneous TID WC  . insulin aspart  3 Units Subcutaneous TID WC  . [MAR Hold] levofloxacin (LEVAQUIN) IV  500 mg Intravenous Q24H  . [MAR Hold] pantoprazole  40 mg Oral BID AC  . potassium chloride  40 mEq Oral Once  . [MAR Hold] tamsulosin  0.4 mg Oral Daily   Continuous Infusions: . sodium chloride 100 mL/hr at 06/20/16 0405  . lactated ringers 75 mL/hr at 06/20/16 1347     LOS: 1 day    Time spent: 35 minutes. Greater than 50% of this time was spent in direct contact with the patient coordinating care.     Chaya JanHERNANDEZ ACOSTA,ESTELA, MD [facility] Pager [phone]  If 7PM-7AM, please contact night-coverage [email or web address] Password [email or web address] 06/20/2016, 3:01 PM

## 2016-06-20 NOTE — Anesthesia Procedure Notes (Signed)
Procedure Name: Intubation Date/Time: 06/20/2016 2:57 PM Performed by: Charmaine Downs Pre-anesthesia Checklist: Patient identified, Patient being monitored, Timeout performed, Emergency Drugs available and Suction available Patient Re-evaluated:Patient Re-evaluated prior to inductionOxygen Delivery Method: Circle System Utilized Preoxygenation: Pre-oxygenation with 100% oxygen Intubation Type: IV induction, Rapid sequence and Cricoid Pressure applied Ventilation: Mask ventilation without difficulty Laryngoscope Size: Mac and 3 Grade View: Grade I Tube type: Oral Tube size: 8.0 mm Number of attempts: 1 Placement Confirmation: ETT inserted through vocal cords under direct vision,  positive ETCO2 and breath sounds checked- equal and bilateral Secured at: 22 cm Tube secured with: Tape Dental Injury: Teeth and Oropharynx as per pre-operative assessment

## 2016-06-20 NOTE — Op Note (Signed)
Preoperative diagnosis: bilateral ureteral calculi, sepsis  Postoperative diagnosis: Same  Procedure: 1 cystoscopy 2. bilateralretrograde pyelography 3.  Intraoperative fluoroscopy, under one hour, with interpretation 4.  bilateral 6 x 26 JJ stent exchange  Attending: Cleda MccreedyPatrick Mackenzie  Anesthesia: General  Estimated blood loss: None  Drains: bilateral 6 x 26 JJ ureteral stent without tether, 16 French foley  Specimens: none  Antibiotics: zosyn  Findings: bilateral ureteral stones. severe bilateral hydronephrosis. No masses/lesions in the bladder. Ureteral orifices in normal anatomic location.  Indications: Patient is a 66 year old male with a history of bilateral ureteral stones and sepsis. After discussing treatment options, they decided proceed with bilateral stent placement.  Procedure her in detail: The patient was brought to the operating room and a brief timeout was done to ensure correct patient, correct procedure, correct site.  General anesthesia was administered patient was placed in dorsal lithotomy position.  Her genitalia was then prepped and draped in usual sterile fashion.  A rigid 22 French cystoscope was passed in the urethra and the bladder.  Bladder was inspected free masses or lesions.  the ureteral orifices were in the normal orthotopic locations. a 6 french ureteral catheter was then instilled into the left ureteral orifice.  a gentle retrograde was obtained and findings noted above. We then advanced a zipwire up to the renal pelvis. We then placed a 6 x 26 double-j ureteral stent over the zip wire. We then removed the wire and good coil was noted in the the renal pelvis under fluoroscopy and the bladder under direct vision.  We then turned out attention to the right side.  a gentle retrograde was obtained and findings noted above. We then advanced a zipwire up to the renal pelvis. we then placed a 6 x 26 double-j ureteral stent over the original zip wire.  We then  removed the wire and good coil was noted in the the renal pelvis under fluoroscopy and the bladder under direct vision. We then placed a 16 french foley catheter the bladder was then drained and this concluded the procedure which was well tolerated by patient.  Complications: None  Condition: Stable, extubated, transferred to PACU  Plan: Patient is to be admitted for IV antibiotics and fluid resusitation. He will be scheduled for stone extraction in 2-3 weeks.

## 2016-06-20 NOTE — Transfer of Care (Signed)
Immediate Anesthesia Transfer of Care Note  Patient: Candis ShineElbert S Goeken  Procedure(s) Performed: Procedure(s): CYSTOSCOPY WITH RETROGRADE PYELOGRAM/URETERAL STENT PLACEMENT (Bilateral)  Patient Location: PACU  Anesthesia Type:General  Level of Consciousness: awake and alert   Airway & Oxygen Therapy: Patient Spontanous Breathing and Patient connected to nasal cannula oxygen  Post-op Assessment: Report given to RN and Post -op Vital signs reviewed and stable  Post vital signs: Reviewed and stable  Last Vitals:  Vitals:   06/20/16 1430 06/20/16 1435  BP: (!) 89/45 (!) 99/49  Pulse:    Resp: 11 11  Temp:      Last Pain:  Vitals:   06/20/16 1328  TempSrc: Oral         Complications: No apparent anesthesia complications

## 2016-06-20 NOTE — Evaluation (Signed)
Physical Therapy Evaluation Patient Details Name: [name] MRN: [id number] DOB: 03-14-1951 Today's Date: 06/20/2016   History of Present Illness  66 y.o. male who is non-verbal at baseline since childhood with some Congnitive Delay with a PMH of Hypertension, Hyperlipidemia, Diabetes Mellitus Type 2, BPH and Urinary Retention, Hx of Nephrolithiasis and other comorbids who presented to [facility] ED from home with a cc of Weakness, Diarrhea, poor po intake and Fall x2 in the last 24 hours. Patient is unable to provide a subjective history and only follows simple commands so a meaningful history was unobtainable from patient. Per Aunt, who is his HCPOA, patient has been having diarrhea for the last 2 weeks and has progressively gotten weaker and has not been eating as well. She states he has been complaining of abdominal pain and right sided flank pain and states that his urine has been bloody. No Nausea, Vomiting, SOB, Chest Pain or Cough noted. Per HCPOA, patient usually walks normally and had a fall last night and early this AM when using the restroom. Patient was kicking the wall so the family member checked on him and found him on the floor. No focal weakness per the HCPOA but just generalized weakness. Patient was going to be D/C'd by the EDP but became orthostatic and spiked a temperature of 103 so [facility] was contacted to admit the patient for Generalized Weakness, and Fever.   CT of abdomen and pelvis showed 11 x 8 mm stone mid right ureter causing obstruction of the right kidney, and ileus.      Clinical Impression  Pt received in bed, Aunt who is the HCPOA is present and gives information regarding pt's PLOF.  Pt is non-verbal.  At baseline, pt is independent with ambulation, toileting, and occasionally requires assistance for bathing.  He has 24/7 supervision/assistance at home from his uncle and nephew.  During PT evaluation he requires Min guard/Min A via HHA for sit<>stand.  He was able to  ambulate 65ft with HHA to and from the bathroom to use BSC.  Further gait deferred due to bowel incontinence.  At this point, recommend that pt return home with HHPT and continued 24/7 supervision/assistance.    Follow Up Recommendations Home health PT;Supervision/Assistance - 24 hour    Equipment Recommendations  None recommended by PT    Recommendations for Other Services       Precautions / Restrictions Precautions Precautions: Fall Precaution Comments: 2 falls in the past 6months Restrictions Weight Bearing Restrictions: No      Mobility  Bed Mobility Overal bed mobility: Needs Assistance Bed Mobility: Supine to Sit     Supine to sit: Supervision;HOB elevated        Transfers Overall transfer level: Needs assistance Equipment used: 1 person hand held assist Transfers: Sit to/from [employer] Sit to Stand: Min guard;Min assist Stand pivot transfers: Min guard       General transfer comment: Multiple sit<>stands performed due to pt being incontinent of bowels with diarrhea and required assistance with hygiene afterwards.  He was able to stand to use the urinal but required assistance for holding the urinal in place.  He was then able to ambulate into the bathroom to use the commode and have a BM.Marland Kitchen  Ambulation/Gait Ambulation/Gait assistance: Min guard Ambulation Distance (Feet): 10 Feet Assistive device: 1 person hand held assist Gait Pattern/deviations: Decreased step length - right;Decreased step length - left;Trunk flexed     General Gait Details: Pt was able to ambulate  with HHA into the bathroom and then back to the chair.  He is mildly unsteady during gait.  Deferred gait in hallway due to bowel incontinence at this time.    Stairs            Wheelchair Mobility    Modified Rankin (Stroke Patients Only)       Balance Overall balance assessment: History of Falls;Needs assistance Sitting-balance support: Feet supported Sitting  balance-[name]: Good Sitting balance - Comments: Pt was able to don socks with supervision while sitting on the EOB.  Aunt stated that he is normally able to perform this taks more quickly.     Standing balance support: Single extremity supported Standing balance-[name]: Fair                               Pertinent Vitals/Pain Pain Assessment: Faces Faces Pain Scale: Hurts little more Pain Location: abdomen Pain Intervention(s): Limited activity within patient's tolerance;Monitored during session;Repositioned    Home Living   Living Arrangements: Other relatives (his uncle) Available Help at Discharge: Available 24 hours/day (nephew also assists at home) Type of Home: House Home Access: Stairs to enter   [employer] of Steps: 1 step Home Layout: One level Home Equipment: Shower seat;Wheelchair - manual      Prior Function Level of Independence: Independent   Gait / Transfers Assistance Needed: independent  ADL's / Homemaking Assistance Needed: assistance for bathing occasionally, and independent with dressing, and toileting.    Comments: [name] states that he enjoys drawing.     Hand Dominance        Extremity/Trunk Assessment   Upper Extremity Assessment Upper Extremity Assessment: Defer to OT evaluation    Lower Extremity Assessment Lower Extremity Assessment: Generalized weakness    Cervical / Trunk Assessment Cervical / Trunk Assessment: Kyphotic  Communication   Communication: Other (comment) (non-verbal)  Cognition Arousal/Alertness: Awake/alert Behavior During Therapy: WFL for tasks assessed/performed Overall Cognitive Status: History of cognitive impairments - at baseline (Pt is non-verbal due to mental retardation, but he is able to follow simple commands. )                      General Comments General comments (skin integrity, edema, etc.): L 3rd toe with notable bruising.  Also with very dry and cracked skin  in between toes.      Exercises     Assessment/Plan    PT Assessment Patient needs continued PT services  PT Problem List Decreased strength;Decreased activity tolerance;Decreased balance;Decreased mobility       PT Treatment Interventions Gait training;DME instruction    PT Goals (Current goals can be found in the Care Plan section)  Acute Rehab PT Goals Patient Stated Goal: Aunt expressed their goal is to take him back home and she is agreeable to HHPT PT Goal Formulation: With family Time For Goal Achievement: 06/27/16 Potential to Achieve Goals: Good    Frequency Min 3X/week   Barriers to discharge        Co-evaluation PT/OT/SLP Co-Evaluation/Treatment: Yes Reason for Co-Treatment: Necessary to address cognition/behavior during functional activity;To address functional/ADL transfers PT goals addressed during session: Mobility/safety with mobility;Balance         End of Session Equipment Utilized During Treatment: Gait belt Activity Tolerance: Patient tolerated treatment well Patient left: in chair;with call bell/phone within reach;with chair alarm set;with nursing/sitter in room;with family/visitor present Nurse Communication: Mobility status Lequita Halt, RN  aware of pt's mobiltiy status and mobiltiy sheet left hanging in the room. ) PT Visit Diagnosis: Other abnormalities of gait and mobility (R26.89);Muscle weakness (generalized) (M62.81);History of falling (Z91.81)    Functional Assessment Tool Used: AM-PAC 6 Clicks Basic Mobility;Clinical judgement Functional Limitation: Mobility: Walking and moving around Mobility: Walking and Moving Around Current Status (U0454(G8978): At least 20 percent but less than 40 percent impaired, limited or restricted Mobility: Walking and Moving Around Goal Status [phone](G8979): At least 1 percent but less than 20 percent impaired, limited or restricted    Time: 0831-0907 PT Time Calculation (min) (ACUTE ONLY): 36 min   Charges:   PT  Evaluation $PT Eval Low Complexity: 1 Procedure     PT G Codes:   PT G-Codes **NOT FOR INPATIENT CLASS** Functional Assessment Tool Used: AM-PAC 6 Clicks Basic Mobility;Clinical judgement Functional Limitation: Mobility: Walking and moving around Mobility: Walking and Moving Around Current Status ([id number](G8978): At least 20 percent but less than 40 percent impaired, limited or restricted Mobility: Walking and Moving Around Goal Status [phone](G8979): At least 1 percent but less than 20 percent impaired, limited or restricted     Beth Lonzie Simmer, PT, DPT X: [phone]

## 2016-06-21 DIAGNOSIS — N132 Hydronephrosis with renal and ureteral calculous obstruction: Secondary | ICD-10-CM

## 2016-06-21 DIAGNOSIS — N179 Acute kidney failure, unspecified: Secondary | ICD-10-CM | POA: Diagnosis not present

## 2016-06-21 LAB — BASIC METABOLIC PANEL
Anion gap: 7 (ref 5–15)
BUN: 21 mg/dL — AB (ref 6–20)
CALCIUM: 7.9 mg/dL — AB (ref 8.9–10.3)
CO2: 21 mmol/L — ABNORMAL LOW (ref 22–32)
Chloride: 108 mmol/L (ref 101–111)
Creatinine, Ser: 1 mg/dL (ref 0.61–1.24)
GFR calc Af Amer: >60 mL/min (ref >60)
GLUCOSE: 143 mg/dL — AB (ref 65–99)
POTASSIUM: 2.7 mmol/L — AB (ref 3.5–5.1)
SODIUM: 136 mmol/L (ref 135–145)

## 2016-06-21 LAB — GASTROINTESTINAL PANEL BY PCR, STOOL (REPLACES STOOL CULTURE)

## 2016-06-21 LAB — CBC
HEMATOCRIT: 32.9 % — AB (ref 39.0–52.0)
Hemoglobin: 10.9 g/dL — ABNORMAL LOW (ref 13.0–17.0)
MCH: 29.5 pg (ref 26.0–34.0)
MCHC: 33.1 g/dL (ref 30.0–36.0)
MCV: 88.9 fL (ref 78.0–100.0)
PLATELETS: 124 10*3/uL — AB (ref 150–400)
RBC: 3.7 MIL/uL — ABNORMAL LOW (ref 4.22–5.81)
RDW: 13.5 % (ref 11.5–15.5)
WBC: 6.8 10*3/uL (ref 4.0–10.5)

## 2016-06-21 LAB — GLUCOSE, CAPILLARY
GLUCOSE-CAPILLARY: 119 mg/dL — AB (ref 65–99)
GLUCOSE-CAPILLARY: 111 mg/dL — AB (ref 65–99)
Glucose-Capillary: 148 mg/dL — ABNORMAL HIGH (ref 65–99)
Glucose-Capillary: 132 mg/dL — ABNORMAL HIGH (ref 65–99)

## 2016-06-21 LAB — HEMOGLOBIN A1C
HEMOGLOBIN A1C: 6.8 % — AB (ref 4.8–5.6)
Hgb A1c MFr Bld: 6.6 % — ABNORMAL HIGH (ref 4.8–5.6)
Mean Plasma Glucose: 143 mg/dL
Mean Plasma Glucose: 148 mg/dL

## 2016-06-21 MED ORDER — MAGNESIUM SULFATE 2 GM/50ML IV SOLN
2.0000 g | Freq: Once | INTRAVENOUS | Status: AC
  Administered 2016-06-21: 2 g via INTRAVENOUS
  Filled 2016-06-21: qty 50

## 2016-06-21 MED ORDER — DEXTROSE 5 % IV SOLN
2.0000 g | INTRAVENOUS | Status: DC
  Administered 2016-06-21 – 2016-06-23 (×3): 2 g via INTRAVENOUS
  Filled 2016-06-21 (×5): qty 2

## 2016-06-21 MED ORDER — SODIUM CHLORIDE 0.9 % IV SOLN: 30.0000 meq | Freq: Once | INTRAVENOUS | Status: DC

## 2016-06-21 MED ORDER — POTASSIUM CHLORIDE 10 MEQ/100ML IV SOLN
10.0000 meq | INTRAVENOUS | Status: AC
  Administered 2016-06-21 (×3): 10 meq via INTRAVENOUS
  Filled 2016-06-21 (×2): qty 100

## 2016-06-21 NOTE — Anesthesia Postprocedure Evaluation (Signed)
Anesthesia Post Note  Patient: [name]  Procedure(s) Performed: Procedure(s) (LRB): CYSTOSCOPY WITH RETROGRADE PYELOGRAM/URETERAL STENT PLACEMENT (Bilateral)  Patient location during evaluation: Nursing Unit Anesthesia Type: General Level of consciousness: awake and responds to stimulation Pain management: pain level controlled Vital Signs Assessment: post-procedure vital signs reviewed and stable Respiratory status: spontaneous breathing, nonlabored ventilation and respiratory function stable Cardiovascular status: blood pressure returned to baseline Postop Assessment: no signs of nausea or vomiting Anesthetic complications: no     Last Vitals:  Vitals:   06/20/16 2117 06/21/16 0550  BP: (!) 100/52 (!) 112/50  Pulse: 83 93  Resp: 16 14  Temp: 36.4 C 36.7 C    Last Pain:  Vitals:   06/21/16 0550  TempSrc: Oral  PainSc:                  [name]

## 2016-06-21 NOTE — Progress Notes (Signed)
Physical Therapy Treatment Patient Details Name: [name] MRN: [id number] DOB: [birth year] Today's Date: 06/21/2016    History of Present Illness 66 y.o. male who is non-verbal at baseline since childhood with some Congnitive Delay with a PMH of Hypertension, Hyperlipidemia, Diabetes Mellitus Type 2, BPH and Urinary Retention, Hx of Nephrolithiasis and other comorbids who presented to [facility] ED from home with a cc of Weakness, Diarrhea, poor po intake and Fall x2 in the last 24 hours. Patient is unable to provide a subjective history and only follows simple commands so a meaningful history was unobtainable from patient. Per Aunt, who is his HCPOA, patient has been having diarrhea for the last 2 weeks and has progressively gotten weaker and has not been eating as well. She states he has been complaining of abdominal pain and right sided flank pain and states that his urine has been bloody. No Nausea, Vomiting, SOB, Chest Pain or Cough noted. Per HCPOA, patient usually walks normally and had a fall last night and early this AM when using the restroom. Patient was kicking the wall so the family member checked on him and found him on the floor. No focal weakness per the HCPOA but just generalized weakness. Patient was going to be D/C'd by the EDP but became orthostatic and spiked a temperature of 103 so [facility] was contacted to admit the patient for Generalized Weakness, and Fever.   CT of abdomen and pelvis showed 11 x 8 mm stone mid right ureter causing obstruction of the right kidney, and ileus.      PT Comments    Pt received in bed, and was agreeable to PT tx.  Multiple family members present.  Pt required assistance for transfer bed<>BSC, and assist for hygiene after BM.  He was then able to ambulate 49ft with RW.  Will continue to progress gait distance as he is able, and continue to recommend HHPT upon d/c.     Follow Up Recommendations  Home health PT;Supervision/Assistance - 24 hour      Equipment Recommendations  None recommended by PT    Recommendations for Other Services       Precautions / Restrictions Precautions Precautions: Fall Precaution Comments: 2 falls in the past 6months.  Mitts on B hands.  Restrictions Weight Bearing Restrictions: No    Mobility  Bed Mobility Overal bed mobility: Modified Independent Bed Mobility: Supine to Sit     Supine to sit: HOB elevated;Modified independent (Device/Increase time)        Transfers Overall transfer level: Needs assistance Equipment used: 1 person hand held assist Transfers: Stand Pivot Transfers           General transfer comment: Pt was able to stand and transfer bed<>BSC.  He required assistance for hygiene after BM.    Ambulation/Gait Ambulation/Gait assistance: Min guard Ambulation Distance (Feet): 80 Feet Assistive device: Rolling walker (2 wheeled) Gait Pattern/deviations: Step-through pattern;Trunk flexed     General Gait Details: Continues to be mildly unsteady with gait, however pt is not used to using a RW. May attempt gait without RW at next visit.    Stairs            Wheelchair Mobility    Modified Rankin (Stroke Patients Only)       Balance Overall balance assessment: History of Falls;Needs assistance Sitting-balance support: Feet supported Sitting balance-[name]: Good     Standing balance support: Bilateral upper extremity supported Standing balance-[name]: Fair  Cognition Arousal/Alertness: Awake/alert Behavior During Therapy: WFL for tasks assessed/performed Overall Cognitive Status: History of cognitive impairments - at baseline (pt is non-verbal due to mental retardation. )                      Exercises      General Comments        Pertinent Vitals/Pain Pain Assessment: No/denies pain    Home Living                      Prior Function            PT Goals (current goals can now be  found in the care plan section) Acute Rehab PT Goals Patient Stated Goal: Aunt expressed their goal is to take him back home and she is agreeable to HHPT PT Goal Formulation: With family Time For Goal Achievement: 06/27/16 Potential to Achieve Goals: Good Progress towards PT goals: Progressing toward goals    Frequency    Min 3X/week      PT Plan Current plan remains appropriate    Co-evaluation             End of Session Equipment Utilized During Treatment: Gait belt Activity Tolerance: Patient tolerated treatment well Patient left: with call bell/phone within reach;with chair alarm set;with nursing/sitter in room;with family/visitor present;in bed Nurse Communication: Mobility status PT Visit Diagnosis: Other abnormalities of gait and mobility (R26.89);Muscle weakness (generalized) (M62.81);History of falling (Z91.81)     Time: [id number] PT Time Calculation (min) (ACUTE ONLY): 27 min  Charges:  $Gait Training: 8-22 mins $Therapeutic Activity: 8-22 mins                    G Codes:       Beth Meriah Shands, PT, DPT X: [phone]

## 2016-06-21 NOTE — Progress Notes (Signed)
PROGRESS NOTE    [name]  BJY:[id number] DOB: 31-May-1950 DOA: 06/19/2016 PCP: Pearson Grippe, MD     Brief Narrative:  66 year old man admitted from home on 3/6 due to weakness and falls. He has a cognitive delay and is nonverbal. Has a history of BPH and nephrolithiasis. He was found to have on CT scan a mid right ureter stone causing obstruction of the right kidney.   Assessment & Plan:   Principal Problem:   Hydronephrosis with urinary obstruction due to ureteral calculus Active Problems:   Mental retardation   Hyperlipidemia   HTN (hypertension)   Fever   Generalized weakness   Diabetes mellitus type 2, controlled (HCC)   SIRS (systemic inflammatory response syndrome) (HCC)   UTI (urinary tract infection)   Hematuria   AKI (acute kidney injury) (HCC)   Hyponatremia   Hypokalemia   BPH (benign prostatic hyperplasia)   Sepsis secondary to UTI (HCC)   Sepsis secondary to UTI -Related to obstructing right ureteral stone. -Urology has been consulted and and patient is status post bilateral ureteral stents. Will need to follow-up with urology in 2-3 weeks for stone removal. -Now BCID with Klebsiella and Enterobacter, will DC Levaquin and start high-dose Rocephin pending final cx data.. -Urine culture with Klebsiella and Proteus. -Hypotension improved.  Generalized weakness -Due to sepsis plus minus diarrhea. -Obtain PT evaluation in a.m.  Acute renal failure -Likely related to ureteral obstruction with stone, improved after stent placement.  Hyponatremia, mild -Resolved.  Hypokalemia -Replace orally. -Magnesium low at 1.4, will replete.  Hypertension -BP meds on hold as he remains hypotensive.  Type 2 diabetes -Well-controlled. -Place on sliding scale insulin.  History of BPH -Continue Flomax   DVT prophylaxis: lovenox Code Status: full code Family Communication: sister and aunt (HCPOA) at bedside updated on plan of care and all questions  answered Disposition Plan: home when ready  Consultants:   Urology  Procedures:   Ureteral stent placement 3/7.  Antimicrobials:  Anti-infectives    Start     Dose/Rate Route Frequency Ordered Stop   06/21/16 1300  cefTRIAXone (ROCEPHIN) 2 g in dextrose 5 % 50 mL IVPB     2 g 100 mL/hr over 30 Minutes Intravenous Every 24 hours 06/21/16 1158     06/20/16 1600  levofloxacin (LEVAQUIN) IVPB 500 mg  Status:  Discontinued     500 mg 100 mL/hr over 60 Minutes Intravenous Every 24 hours 06/19/16 1721 06/21/16 1158   06/19/16 1615  vancomycin (VANCOCIN) IVPB 1000 mg/200 mL premix     1,000 mg 200 mL/hr over 60 Minutes Intravenous  Once 06/19/16 1610 06/19/16 1829   06/19/16 1615  piperacillin-tazobactam (ZOSYN) IVPB 3.375 g     3.375 g 100 mL/hr over 30 Minutes Intravenous  Once 06/19/16 1610 06/19/16 1729   06/19/16 1530  levofloxacin (LEVAQUIN) tablet 500 mg     500 mg Oral  Once 06/19/16 1527 06/19/16 1557       Subjective: Non-verbal, sitting in chair, appears comfortable  Objective: Vitals:   06/20/16 2117 06/21/16 0550 06/21/16 1421 06/21/16 1422  BP: (!) 100/52 (!) 112/50 (!) 158/137 (!) 103/43  Pulse: 83 93 89   Resp: 16 14 16    Temp: 97.6 F (36.4 C) 98 F (36.7 C) 99.2 F (37.3 C)   TempSrc: Oral Oral Oral   SpO2: 100% 99% 98%   Weight:      Height:        Intake/Output Summary (Last 24 hours) at 06/21/16  1718 Last data filed at 06/21/16 1430  Gross per 24 hour  Intake             3550 ml  Output             1750 ml  Net             1800 ml   Filed Weights   06/19/16 1853 06/20/16 0635 06/20/16 1328  Weight: 72.5 kg (159 lb 12.8 oz) 72.4 kg (159 lb 9.8 oz) 72.1 kg (159 lb)    Examination:  General exam: Alert, awake, oriented x 3 Respiratory system: Clear to auscultation. Respiratory effort normal. Cardiovascular system:RRR. No murmurs, rubs, gallops. Gastrointestinal system: Abdomen is nondistended, soft and nontender. No organomegaly or masses  felt. Normal bowel sounds heard. Central nervous system: Alert and oriented. No focal neurological deficits. Extremities: No C/C/E, +pedal pulses Skin: No rashes, lesions or ulcers Psychiatry: Judgement and insight appear normal. Mood & affect appropriate.     Data Reviewed: I have personally reviewed following labs and imaging studies  CBC:  Recent Labs Lab 06/19/16 1344 06/20/16 0524 06/21/16 0515  WBC 9.2 6.0 6.8  HGB 11.7* 12.0* 10.9*  HCT 35.6* 36.5* 32.9*  MCV 90.4 89.7 88.9  PLT 115* 102* 124*   Basic Metabolic Panel:  Recent Labs Lab 06/19/16 1344 06/20/16 0524 06/20/16 2313 06/21/16 0515  NA 133* 134*  --  136  K 3.4* 3.3*  --  2.7*  CL 98* 103  --  108  CO2 26 22  --  21*  GLUCOSE 135* 135*  --  143*  BUN 26* 32*  --  21*  CREATININE 1.47* 1.68*  --  1.00  CALCIUM 8.7* 8.0*  --  7.9*  MG  --   --  1.4*  --    GFR: Estimated Creatinine Clearance: 74.1 mL/min (by C-G formula based on SCr of 1 mg/dL). Liver Function Tests:  Recent Labs Lab 06/19/16 1344 06/20/16 0524  AST 36 42*  ALT 14* 16*  ALKPHOS 49 41  BILITOT 1.0 1.3*  PROT 7.3 6.6  ALBUMIN 3.6 3.1*    Recent Labs Lab 06/19/16 1344  LIPASE 11   No results for input(s): AMMONIA in the last 168 hours. Coagulation Profile: No results for input(s): INR, PROTIME in the last 168 hours. Cardiac Enzymes: No results for input(s): CKTOTAL, CKMB, CKMBINDEX, TROPONINI in the last 168 hours. BNP (last 3 results) No results for input(s): PROBNP in the last 8760 hours. HbA1C:  Recent Labs  06/19/16 1344 06/20/16 0524  HGBA1C 6.6* 6.8*   CBG:  Recent Labs Lab 06/20/16 1646 06/20/16 2108 06/21/16 0749 06/21/16 1128 06/21/16 1634  GLUCAP 150* 112* 119* 111* 148*   Lipid Profile: No results for input(s): CHOL, HDL, LDLCALC, TRIG, CHOLHDL, LDLDIRECT in the last 72 hours. Thyroid Function Tests:  Recent Labs  06/19/16 1918  TSH 1.008   Anemia Panel: No results for input(s):  VITAMINB12, FOLATE, FERRITIN, TIBC, IRON, RETICCTPCT in the last 72 hours. Urine analysis:    Component Value Date/Time   COLORURINE RED (A) 06/19/2016 1123   APPEARANCEUR HAZY (A) 06/19/2016 1123   LABSPEC 1.010 06/19/2016 1123   PHURINE 8.5 (H) 06/19/2016 1123   GLUCOSEU 100 (A) 06/19/2016 1123   HGBUR LARGE (A) 06/19/2016 1123   BILIRUBINUR MODERATE (A) 06/19/2016 1123   KETONESUR 15 (A) 06/19/2016 1123   PROTEINUR >300 (A) 06/19/2016 1123   UROBILINOGEN 1.0 07/20/2014 1718   NITRITE POSITIVE (A) 06/19/2016 1123  LEUKOCYTESUR LARGE (A) 06/19/2016 1123   Sepsis Labs: @LABRCNTIP (procalcitonin:4,lacticidven:4)  ) Recent Results (from the past 240 hour(s))  Blood culture (routine x 2)     Status: None (Preliminary result)   Collection Time: 06/19/16  4:09 PM  Result Value Ref Range Status   Specimen Description BLOOD  Final   Special Requests NONE  Final   Culture NO GROWTH 2 DAYS  Final   Report Status PENDING  Incomplete  Blood culture (routine x 2)     Status: Abnormal (Preliminary result)   Collection Time: 06/19/16  4:22 PM  Result Value Ref Range Status   Specimen Description BLOOD  Final   Special Requests NONE  Final   Culture  Setup Time   Final    GRAM NEGATIVE RODS Gram Stain Report Called to,Read Back By and Verified With: TETREAULT H. AT [id number] ON [id number] BY THOMPSON S. Organism ID to follow CRITICAL RESULT CALLED TO, READ BACK BY AND VERIFIED WITH: B. [employer].D. 14:50 06/20/16 ([email or web address]) Performed at [facility], [address] [place], [place] [address]    Culture KLEBSIELLA PNEUMONIAE (A)  Final   Report Status PENDING  Incomplete  Blood Culture ID Panel (Reflexed)     Status: Abnormal   Collection Time: 06/19/16  4:22 PM  Result Value Ref Range Status   Enterococcus species NOT DETECTED NOT DETECTED Final   Listeria monocytogenes NOT DETECTED NOT DETECTED Final   Staphylococcus species NOT DETECTED NOT DETECTED Final   Staphylococcus aureus  NOT DETECTED NOT DETECTED Final   Streptococcus species NOT DETECTED NOT DETECTED Final   Streptococcus agalactiae NOT DETECTED NOT DETECTED Final   Streptococcus pneumoniae NOT DETECTED NOT DETECTED Final   Streptococcus pyogenes NOT DETECTED NOT DETECTED Final   Acinetobacter baumannii NOT DETECTED NOT DETECTED Final   Enterobacteriaceae species DETECTED (A) NOT DETECTED Final    Comment: Enterobacteriaceae represent a large family of gram-negative bacteria, not a single organism. CRITICAL RESULT CALLED TO, READ BACK BY AND VERIFIED WITH: B. [employer].D. 14:50 06/20/16 ([email or web address])    Enterobacter cloacae complex NOT DETECTED NOT DETECTED Final   Escherichia coli NOT DETECTED NOT DETECTED Final   Klebsiella oxytoca NOT DETECTED NOT DETECTED Final   Klebsiella pneumoniae DETECTED (A) NOT DETECTED Final    Comment: CRITICAL RESULT CALLED TO, READ BACK BY AND VERIFIED WITH: B. [employer].D. 14:50 06/20/16 ([email or web address])    Proteus species NOT DETECTED NOT DETECTED Final   Serratia marcescens NOT DETECTED NOT DETECTED Final   Carbapenem resistance NOT DETECTED NOT DETECTED Final   Haemophilus influenzae NOT DETECTED NOT DETECTED Final   Neisseria meningitidis NOT DETECTED NOT DETECTED Final   Pseudomonas aeruginosa NOT DETECTED NOT DETECTED Final   Candida albicans NOT DETECTED NOT DETECTED Final   Candida glabrata NOT DETECTED NOT DETECTED Final   Candida krusei NOT DETECTED NOT DETECTED Final   Candida parapsilosis NOT DETECTED NOT DETECTED Final   Candida tropicalis NOT DETECTED NOT DETECTED Final    Comment: Performed at [facility], [address] [place], [place] [address]  Culture, Urine     Status: Abnormal (Preliminary result)   Collection Time: 06/19/16  5:09 PM  Result Value Ref Range Status   Specimen Description URINE, CLEAN CATCH  Final   Special Requests NONE  Final   Culture (A)  Final    >=100,000 COLONIES/mL KLEBSIELLA PNEUMONIAE >=100,000 COLONIES/mL PROTEUS  MIRABILIS SUSCEPTIBILITIES TO FOLLOW Performed at [facility], [address] [place], [place]  [id number]    Report Status PENDING  Incomplete  Gastrointestinal Panel by PCR , Stool     Status: None   Collection Time: 06/20/16  9:07 AM  Result Value Ref Range Status   Campylobacter species NOT DETECTED NOT DETECTED Final   Plesimonas shigelloides NOT DETECTED NOT DETECTED Final   Salmonella species NOT DETECTED NOT DETECTED Final   Yersinia enterocolitica NOT DETECTED NOT DETECTED Final   Vibrio species NOT DETECTED NOT DETECTED Final   Vibrio cholerae NOT DETECTED NOT DETECTED Final   Enteroaggregative E coli (EAEC) NOT DETECTED NOT DETECTED Final   Enteropathogenic E coli (EPEC) NOT DETECTED NOT DETECTED Final   Enterotoxigenic E coli (ETEC) NOT DETECTED NOT DETECTED Final   Shiga like toxin producing E coli (STEC) NOT DETECTED NOT DETECTED Final   Shigella/Enteroinvasive E coli (EIEC) NOT DETECTED NOT DETECTED Final   Cryptosporidium NOT DETECTED NOT DETECTED Final   Cyclospora cayetanensis NOT DETECTED NOT DETECTED Final   Entamoeba histolytica NOT DETECTED NOT DETECTED Final   Giardia lamblia NOT DETECTED NOT DETECTED Final   Adenovirus F40/41 NOT DETECTED NOT DETECTED Final   Astrovirus NOT DETECTED NOT DETECTED Final   Norovirus GI/GII NOT DETECTED NOT DETECTED Final   Rotavirus A NOT DETECTED NOT DETECTED Final   Sapovirus (I, II, IV, and V) NOT DETECTED NOT DETECTED Final  C difficile quick scan w PCR reflex     Status: None   Collection Time: 06/20/16  9:07 AM  Result Value Ref Range Status   C Diff antigen NEGATIVE NEGATIVE Final   C Diff toxin NEGATIVE NEGATIVE Final   C Diff interpretation No C. difficile detected.  Final         Radiology Studies: Ct Abdomen Pelvis Wo Contrast  Result Date: 06/19/2016 CLINICAL DATA:  Abdominal pain EXAM: CT ABDOMEN AND PELVIS WITHOUT CONTRAST TECHNIQUE: Multidetector CT imaging of the abdomen and pelvis was performed  following the standard protocol without IV contrast. COMPARISON:  CT abdomen pelvis 05/07/2014 FINDINGS: Lower chest: Negative Hepatobiliary: Numerous small gallstones. No gallbladder wall thickening. Small calcified granuloma in the liver. No liver mass. Bile ducts nondilated. Pancreas: Negative Spleen: Negative Adrenals/Urinary Tract: Right hydronephrosis and hydroureter. Obstructing stone above the iliac crossing. This stone measures 11 x 8 mm. Numerous small left renal calculi in multiple calices and renal pelvis without hydronephrosis. No left ureteral stone. Bladder wall is diffusely thickened. This has progressed in the interval. Stomach/Bowel: Dilated colon compatible with ileus. Mild small bowel dilatation. Moderate stool in the left colon. Vascular/Lymphatic: Mild atherosclerotic disease. Reproductive: Negative Other: None Musculoskeletal: Lumbar degenerative changes. Severe spinal stenosis at L4-5 due to spurring. Chronic compression fracture T12 IMPRESSION: 11 x 8 mm stone mid right ureter causing obstruction of the right kidney. Numerous small left renal calculi without obstruction. Extensive bladder wall thickening with progression Cholelithiasis without gallbladder thickening Severe spinal stenosis L4-5 due to bony overgrowth. Ileus Electronically Signed   By: Marlan Palauharles  Clark M.D.   On: 06/19/2016 20:18   Dg C-arm 1-60 Min-no Report  Result Date: 06/20/2016 Fluoroscopy was utilized by the requesting physician.  No radiographic interpretation.        Scheduled Meds: . aspirin EC  81 mg Oral q morning - 10a  . atorvastatin  20 mg Oral QPM  . cefTRIAXone (ROCEPHIN)  IV  2 g Intravenous Q24H  . enoxaparin (LOVENOX) injection  40 mg Subcutaneous Q24H  . insulin aspart  0-5 Units Subcutaneous QHS  . insulin aspart  0-9 Units Subcutaneous  TID WC  . insulin aspart  3 Units Subcutaneous TID WC  . pantoprazole  40 mg Oral BID AC  . potassium chloride  40 mEq Oral Once  . tamsulosin  0.4 mg  Oral Daily   Continuous Infusions: . sodium chloride 100 mL/hr at 06/21/16 1033     LOS: 2 days    Time spent: 35 minutes. Greater than 50% of this time was spent in direct contact with the patient coordinating care.     Chaya Jan, MD [facility] Pager [phone]  If 7PM-7AM, please contact night-coverage [email or web address] Password [email or web address] 06/21/2016, 5:18 PM

## 2016-06-21 NOTE — Progress Notes (Addendum)
CRITICAL VALUE ALERT  Critical value received:  K 2.7  Date of notification:  06/21/2016  Time of notification:  0610  Critical value read back:Yes.    Nurse who received alert:  Gean QuintB. Johnson, RN  MD notified (1st page):  Ardyth HarpsHernandez, MD  Time of first page:  [phone]  MD notified (2nd page):  Time of second page:  Responding MD:  Ardyth HarpsHernandez  Time MD responded: [phone]

## 2016-06-21 NOTE — Progress Notes (Signed)
Patient not in restraints.  Patient has an order for restraint, but restraints have not been applied since order was written.

## 2016-06-21 NOTE — Addendum Note (Signed)
Addendum  created 06/21/16 [id number] by Despina Hiddenobert J Mahalie Kanner, CRNA   Sign clinical note

## 2016-06-21 NOTE — Progress Notes (Signed)
Patient has not needed restraints since the order was put in. Patient has remained calm and cooperative. Safety mitts applied. Will continue to monitor.  Quita SkyeMorgan P Dishmon, RN

## 2016-06-21 NOTE — Progress Notes (Signed)
PHARMACY - PHYSICIAN COMMUNICATION CRITICAL VALUE ALERT - BLOOD CULTURE IDENTIFICATION (BCID)  Results for orders placed or performed during the hospital encounter of 06/19/16  Blood Culture ID Panel (Reflexed) (Collected: 06/19/2016  4:22 PM)  Result Value Ref Range   Enterococcus species NOT DETECTED NOT DETECTED   Listeria monocytogenes NOT DETECTED NOT DETECTED   Staphylococcus species NOT DETECTED NOT DETECTED   Staphylococcus aureus NOT DETECTED NOT DETECTED   Streptococcus species NOT DETECTED NOT DETECTED   Streptococcus agalactiae NOT DETECTED NOT DETECTED   Streptococcus pneumoniae NOT DETECTED NOT DETECTED   Streptococcus pyogenes NOT DETECTED NOT DETECTED   Acinetobacter baumannii NOT DETECTED NOT DETECTED   Enterobacteriaceae species DETECTED (A) NOT DETECTED   Enterobacter cloacae complex NOT DETECTED NOT DETECTED   Escherichia coli NOT DETECTED NOT DETECTED   Klebsiella oxytoca NOT DETECTED NOT DETECTED   Klebsiella pneumoniae DETECTED (A) NOT DETECTED   Proteus species NOT DETECTED NOT DETECTED   Serratia marcescens NOT DETECTED NOT DETECTED   Carbapenem resistance NOT DETECTED NOT DETECTED   Haemophilus influenzae NOT DETECTED NOT DETECTED   Neisseria meningitidis NOT DETECTED NOT DETECTED   Pseudomonas aeruginosa NOT DETECTED NOT DETECTED   Candida albicans NOT DETECTED NOT DETECTED   Candida glabrata NOT DETECTED NOT DETECTED   Candida krusei NOT DETECTED NOT DETECTED   Candida parapsilosis NOT DETECTED NOT DETECTED   Candida tropicalis NOT DETECTED NOT DETECTED   Name of physician (or Provider) Contacted: Dr Ardyth HarpsHernandez  Changes to prescribed antibiotics required: Rocephin 2gm IV q24hrs, d/c Levaquin  Valrie HartHall, Shadrach Bartunek A 06/21/2016  12:13 PM

## 2016-06-22 ENCOUNTER — Encounter (HOSPITAL_COMMUNITY): Payer: Self-pay | Admitting: Urology

## 2016-06-22 DIAGNOSIS — N132 Hydronephrosis with renal and ureteral calculous obstruction: Secondary | ICD-10-CM | POA: Diagnosis not present

## 2016-06-22 LAB — GLUCOSE, CAPILLARY
GLUCOSE-CAPILLARY: 137 mg/dL — AB (ref 65–99)
GLUCOSE-CAPILLARY: 96 mg/dL (ref 65–99)
Glucose-Capillary: 119 mg/dL — ABNORMAL HIGH (ref 65–99)
Glucose-Capillary: 114 mg/dL — ABNORMAL HIGH (ref 65–99)

## 2016-06-22 LAB — MAGNESIUM
MAGNESIUM: 1.9 mg/dL (ref 1.7–2.4)
Magnesium: 2 mg/dL (ref 1.7–2.4)

## 2016-06-22 LAB — CBC
HEMATOCRIT: 31.8 % — AB (ref 39.0–52.0)
HEMOGLOBIN: 10.6 g/dL — AB (ref 13.0–17.0)
MCH: 29.9 pg (ref 26.0–34.0)
MCHC: 33.3 g/dL (ref 30.0–36.0)
MCV: 89.6 fL (ref 78.0–100.0)
Platelets: 142 10*3/uL — ABNORMAL LOW (ref 150–400)
RBC: 3.55 MIL/uL — AB (ref 4.22–5.81)
RDW: 13.6 % (ref 11.5–15.5)
WBC: 6.8 10*3/uL (ref 4.0–10.5)

## 2016-06-22 LAB — BASIC METABOLIC PANEL
ANION GAP: 6 (ref 5–15)
BUN: 10 mg/dL (ref 6–20)
CALCIUM: 7.9 mg/dL — AB (ref 8.9–10.3)
CO2: 24 mmol/L (ref 22–32)
Chloride: 108 mmol/L (ref 101–111)
Creatinine, Ser: 0.84 mg/dL (ref 0.61–1.24)
Glucose, Bld: 155 mg/dL — ABNORMAL HIGH (ref 65–99)
Potassium: 2.7 mmol/L — CL (ref 3.5–5.1)
Sodium: 138 mmol/L (ref 135–145)

## 2016-06-22 LAB — URINE CULTURE: Culture: >=100000 — AB

## 2016-06-22 LAB — CULTURE, BLOOD (ROUTINE X 2)

## 2016-06-22 MED ORDER — POTASSIUM CHLORIDE 10 MEQ/100ML IV SOLN
10.0000 meq | INTRAVENOUS | Status: AC
  Administered 2016-06-22 (×2): 10 meq via INTRAVENOUS
  Filled 2016-06-22: qty 100

## 2016-06-22 MED ORDER — POTASSIUM CHLORIDE CRYS ER 20 MEQ PO TBCR
40.0000 meq | EXTENDED_RELEASE_TABLET | ORAL | Status: AC
  Administered 2016-06-22 (×3): 40 meq via ORAL
  Filled 2016-06-22 (×3): qty 2

## 2016-06-22 NOTE — Progress Notes (Addendum)
CRITICAL VALUE ALERT  Critical value received: potassium 2.7  Date of notification: 06-22-16  Time of notification: 0630  Critical value read back: yes  Nurse who received alert:  Bjohnson, rn   MD notified (1st page): Sharl Malama, md  Time of first page: 0640  MD notified (2nd page):  Time of second page:  Responding MD:  Sharl MaLama, md Time MD responded:  [phone]  Received orders for potassium runs and magnesium check.

## 2016-06-22 NOTE — Progress Notes (Signed)
Pt catheter had few blood clots in the tube. Wanted to make sure it was draining correctly; received a verbal order from Dr. Laverle PatterBorden, urologist that is On call to flush urinary catheter. Pushed in 20 ml of sterile water and didn't meet any resistance.

## 2016-06-22 NOTE — Care Management Important Message (Signed)
Important Message  Patient Details  Name: [name] MRN: [id number] Date of Birth: Jan 24, 1951   Medicare Important Message Given:  Yes    Malcolm MetroChildress, Anaysia Germer Demske, RN 06/22/2016, 1:13 PM

## 2016-06-22 NOTE — Progress Notes (Signed)
PROGRESS NOTE    [name]  ZOX:[id number] DOB: 1950-12-09 DOA: 06/19/2016 PCP: Pearson Grippe, MD     Brief Narrative:  66 year old man admitted from home on 3/6 due to weakness and falls. He has a cognitive delay and is nonverbal. Has a history of BPH and nephrolithiasis. He was found to have on CT scan a mid right ureter stone causing obstruction of the right kidney.   Assessment & Plan:   Principal Problem:   Hydronephrosis with urinary obstruction due to ureteral calculus Active Problems:   Mental retardation   Hyperlipidemia   HTN (hypertension)   Fever   Generalized weakness   Diabetes mellitus type 2, controlled (HCC)   SIRS (systemic inflammatory response syndrome) (HCC)   UTI (urinary tract infection)   Hematuria   AKI (acute kidney injury) (HCC)   Hyponatremia   Hypokalemia   BPH (benign prostatic hyperplasia)   Sepsis secondary to UTI (HCC)   Sepsis secondary to UTI -Related to obstructing right ureteral stone. -Urology has been consulted and and patient is status post bilateral ureteral stents. Will need to follow-up with urology in 2-3 weeks for stone removal. -BC with kelbsiella, urine cx with proteus. There are plenty of PO abx to choose from at time of DC. -Hypotension resolved.  Generalized weakness -Due to sepsis plus minus diarrhea. -Obtain PT evaluation in a.m.  Acute renal failure -Likely related to ureteral obstruction with stone, improved after stent placement.  Hyponatremia, mild -Resolved.  Hypokalemia/Hypomagnesemia -K 2.7 today. -Continue to replace. -Mg replaced.   Hypertension -BP meds on hold as he remains hypotensive.  Type 2 diabetes -Well-controlled. -Place on sliding scale insulin.  History of BPH -Continue Flomax   DVT prophylaxis: lovenox Code Status: full code Family Communication: sister and aunt (HCPOA) at bedside updated on plan of care and all questions answered Disposition Plan: home when  ready  Consultants:   Urology  Procedures:   Ureteral stent placement 3/7.  Antimicrobials:  Anti-infectives    Start     Dose/Rate Route Frequency Ordered Stop   06/21/16 1300  cefTRIAXone (ROCEPHIN) 2 g in dextrose 5 % 50 mL IVPB     2 g 100 mL/hr over 30 Minutes Intravenous Every 24 hours 06/21/16 1158     06/20/16 1600  levofloxacin (LEVAQUIN) IVPB 500 mg  Status:  Discontinued     500 mg 100 mL/hr over 60 Minutes Intravenous Every 24 hours 06/19/16 1721 06/21/16 1158   06/19/16 1615  vancomycin (VANCOCIN) IVPB 1000 mg/200 mL premix     1,000 mg 200 mL/hr over 60 Minutes Intravenous  Once 06/19/16 1610 06/19/16 1829   06/19/16 1615  piperacillin-tazobactam (ZOSYN) IVPB 3.375 g     3.375 g 100 mL/hr over 30 Minutes Intravenous  Once 06/19/16 1610 06/19/16 1729   06/19/16 1530  levofloxacin (LEVAQUIN) tablet 500 mg     500 mg Oral  Once 06/19/16 1527 06/19/16 1557       Subjective: Non-verbal, sitting in chair, appears comfortable  Objective: Vitals:   06/21/16 1940 06/21/16 2153 06/22/16 0522 06/22/16 1310  BP:  (!) 114/51 (!) 112/53 (!) 105/52  Pulse:  92 77 72  Resp:  16 16 16   Temp:  99.1 F (37.3 C) 97.5 F (36.4 C) 98 F (36.7 C)  TempSrc:  Oral Axillary Oral  SpO2: 93% 96% 97% 98%  Weight:      Height:        Intake/Output Summary (Last 24 hours) at 06/22/16 1730 Last data  filed at 06/22/16 1700  Gross per 24 hour  Intake             3580 ml  Output             2275 ml  Net             1305 ml   Filed Weights   06/19/16 1853 06/20/16 0635 06/20/16 1328  Weight: 72.5 kg (159 lb 12.8 oz) 72.4 kg (159 lb 9.8 oz) 72.1 kg (159 lb)    Examination:  General exam: Alert, awake, oriented x 3 Respiratory system: Clear to auscultation. Respiratory effort normal. Cardiovascular system:RRR. No murmurs, rubs, gallops. Gastrointestinal system: Abdomen is nondistended, soft and nontender. No organomegaly or masses felt. Normal bowel sounds heard. Central  nervous system: Alert and oriented. No focal neurological deficits. Extremities: No C/C/E, +pedal pulses Skin: No rashes, lesions or ulcers Psychiatry: Judgement and insight appear normal. Mood & affect appropriate.     Data Reviewed: I have personally reviewed following labs and imaging studies  CBC:  Recent Labs Lab 06/19/16 1344 06/20/16 0524 06/21/16 0515 06/22/16 0458  WBC 9.2 6.0 6.8 6.8  HGB 11.7* 12.0* 10.9* 10.6*  HCT 35.6* 36.5* 32.9* 31.8*  MCV 90.4 89.7 88.9 89.6  PLT 115* 102* 124* 142*   Basic Metabolic Panel:  Recent Labs Lab 06/19/16 1344 06/20/16 0524 06/20/16 2313 06/21/16 0515 06/22/16 0458 06/22/16 0744  NA 133* 134*  --  136 138  --   K 3.4* 3.3*  --  2.7* 2.7*  --   CL 98* 103  --  108 108  --   CO2 26 22  --  21* 24  --   GLUCOSE 135* 135*  --  143* 155*  --   BUN 26* 32*  --  21* 10  --   CREATININE 1.47* 1.68*  --  1.00 0.84  --   CALCIUM 8.7* 8.0*  --  7.9* 7.9*  --   MG  --   --  1.4*  --  1.9 2.0   GFR: Estimated Creatinine Clearance: 88.2 mL/min (by C-G formula based on SCr of 0.84 mg/dL). Liver Function Tests:  Recent Labs Lab 06/19/16 1344 06/20/16 0524  AST 36 42*  ALT 14* 16*  ALKPHOS 49 41  BILITOT 1.0 1.3*  PROT 7.3 6.6  ALBUMIN 3.6 3.1*    Recent Labs Lab 06/19/16 1344  LIPASE 11   No results for input(s): AMMONIA in the last 168 hours. Coagulation Profile: No results for input(s): INR, PROTIME in the last 168 hours. Cardiac Enzymes: No results for input(s): CKTOTAL, CKMB, CKMBINDEX, TROPONINI in the last 168 hours. BNP (last 3 results) No results for input(s): PROBNP in the last 8760 hours. HbA1C:  Recent Labs  06/20/16 0524  HGBA1C 6.8*   CBG:  Recent Labs Lab 06/21/16 1634 06/21/16 2152 06/22/16 0807 06/22/16 1059 06/22/16 1634  GLUCAP 148* 132* 137* 96 119*   Lipid Profile: No results for input(s): CHOL, HDL, LDLCALC, TRIG, CHOLHDL, LDLDIRECT in the last 72 hours. Thyroid Function  Tests:  Recent Labs  06/19/16 1918  TSH 1.008   Anemia Panel: No results for input(s): VITAMINB12, FOLATE, FERRITIN, TIBC, IRON, RETICCTPCT in the last 72 hours. Urine analysis:    Component Value Date/Time   COLORURINE RED (A) 06/19/2016 1123   APPEARANCEUR HAZY (A) 06/19/2016 1123   LABSPEC 1.010 06/19/2016 1123   PHURINE 8.5 (H) 06/19/2016 1123   GLUCOSEU 100 (A) 06/19/2016 1123   HGBUR LARGE (  A) 06/19/2016 1123   BILIRUBINUR MODERATE (A) 06/19/2016 1123   KETONESUR 15 (A) 06/19/2016 1123   PROTEINUR >300 (A) 06/19/2016 1123   UROBILINOGEN 1.0 07/20/2014 1718   NITRITE POSITIVE (A) 06/19/2016 1123   LEUKOCYTESUR LARGE (A) 06/19/2016 1123   Sepsis Labs: @LABRCNTIP (procalcitonin:4,lacticidven:4)  ) Recent Results (from the past 240 hour(s))  Blood culture (routine x 2)     Status: None (Preliminary result)   Collection Time: 06/19/16  4:09 PM  Result Value Ref Range Status   Specimen Description BLOOD  Final   Special Requests NONE  Final   Culture NO GROWTH 3 DAYS  Final   Report Status PENDING  Incomplete  Blood culture (routine x 2)     Status: Abnormal   Collection Time: 06/19/16  4:22 PM  Result Value Ref Range Status   Specimen Description BLOOD  Final   Special Requests NONE  Final   Culture  Setup Time   Final    GRAM NEGATIVE RODS Gram Stain Report Called to,Read Back By and Verified With: TETREAULT H. AT [id number] ON [id number] BY THOMPSON S. Organism ID to follow CRITICAL RESULT CALLED TO, READ BACK BY AND VERIFIED WITH: B. [employer].D. 14:50 06/20/16 ([email or web address]) Performed at [facility], [address], [place], [place] [address]    Culture KLEBSIELLA PNEUMONIAE (A)  Final   Report Status 06/22/2016 FINAL  Final   Organism ID, Bacteria KLEBSIELLA PNEUMONIAE  Final      Susceptibility   Klebsiella pneumoniae - MIC*    AMPICILLIN >=32 RESISTANT Resistant     CEFAZOLIN <=4 SENSITIVE Sensitive     CEFEPIME <=1 SENSITIVE Sensitive     CEFTAZIDIME <=1  SENSITIVE Sensitive     CEFTRIAXONE <=1 SENSITIVE Sensitive     CIPROFLOXACIN <=0.25 SENSITIVE Sensitive     GENTAMICIN <=1 SENSITIVE Sensitive     IMIPENEM <=0.25 SENSITIVE Sensitive     TRIMETH/SULFA <=20 SENSITIVE Sensitive     AMPICILLIN/SULBACTAM 8 SENSITIVE Sensitive     PIP/TAZO <=4 SENSITIVE Sensitive     Extended ESBL NEGATIVE Sensitive     * KLEBSIELLA PNEUMONIAE  Blood Culture ID Panel (Reflexed)     Status: Abnormal   Collection Time: 06/19/16  4:22 PM  Result Value Ref Range Status   Enterococcus species NOT DETECTED NOT DETECTED Final   Listeria monocytogenes NOT DETECTED NOT DETECTED Final   Staphylococcus species NOT DETECTED NOT DETECTED Final   Staphylococcus aureus NOT DETECTED NOT DETECTED Final   Streptococcus species NOT DETECTED NOT DETECTED Final   Streptococcus agalactiae NOT DETECTED NOT DETECTED Final   Streptococcus pneumoniae NOT DETECTED NOT DETECTED Final   Streptococcus pyogenes NOT DETECTED NOT DETECTED Final   Acinetobacter baumannii NOT DETECTED NOT DETECTED Final   Enterobacteriaceae species DETECTED (A) NOT DETECTED Final    Comment: Enterobacteriaceae represent a large family of gram-negative bacteria, not a single organism. CRITICAL RESULT CALLED TO, READ BACK BY AND VERIFIED WITH: B. [employer].D. 14:50 06/20/16 ([email or web address])    Enterobacter cloacae complex NOT DETECTED NOT DETECTED Final   Escherichia coli NOT DETECTED NOT DETECTED Final   Klebsiella oxytoca NOT DETECTED NOT DETECTED Final   Klebsiella pneumoniae DETECTED (A) NOT DETECTED Final    Comment: CRITICAL RESULT CALLED TO, READ BACK BY AND VERIFIED WITH: B. [employer].D. 14:50 06/20/16 ([email or web address])    Proteus species NOT DETECTED NOT DETECTED Final   Serratia marcescens NOT DETECTED NOT DETECTED Final   Carbapenem resistance NOT DETECTED NOT DETECTED Final  Haemophilus influenzae NOT DETECTED NOT DETECTED Final   Neisseria meningitidis NOT DETECTED NOT DETECTED Final   Pseudomonas  aeruginosa NOT DETECTED NOT DETECTED Final   Candida albicans NOT DETECTED NOT DETECTED Final   Candida glabrata NOT DETECTED NOT DETECTED Final   Candida krusei NOT DETECTED NOT DETECTED Final   Candida parapsilosis NOT DETECTED NOT DETECTED Final   Candida tropicalis NOT DETECTED NOT DETECTED Final    Comment: Performed at [facility], [address] [place], [place] [address]  Culture, Urine     Status: Abnormal   Collection Time: 06/19/16  5:09 PM  Result Value Ref Range Status   Specimen Description URINE, CLEAN CATCH  Final   Special Requests NONE  Final   Culture (A)  Final    >=100,000 COLONIES/mL KLEBSIELLA PNEUMONIAE >=100,000 COLONIES/mL PROTEUS MIRABILIS    Report Status 06/22/2016 FINAL  Final   Organism ID, Bacteria KLEBSIELLA PNEUMONIAE (A)  Final   Organism ID, Bacteria PROTEUS MIRABILIS (A)  Final      Susceptibility   Klebsiella pneumoniae - MIC*    AMPICILLIN >=32 RESISTANT Resistant     CEFAZOLIN <=4 SENSITIVE Sensitive     CEFTRIAXONE <=1 SENSITIVE Sensitive     CIPROFLOXACIN <=0.25 SENSITIVE Sensitive     GENTAMICIN <=1 SENSITIVE Sensitive     IMIPENEM <=0.25 SENSITIVE Sensitive     NITROFURANTOIN 64 INTERMEDIATE Intermediate     TRIMETH/SULFA <=20 SENSITIVE Sensitive     AMPICILLIN/SULBACTAM 4 SENSITIVE Sensitive     PIP/TAZO <=4 SENSITIVE Sensitive     Extended ESBL NEGATIVE Sensitive     * >=100,000 COLONIES/mL KLEBSIELLA PNEUMONIAE   Proteus mirabilis - MIC*    AMPICILLIN <=2 SENSITIVE Sensitive     CEFAZOLIN <=4 SENSITIVE Sensitive     CEFTRIAXONE <=1 SENSITIVE Sensitive     CIPROFLOXACIN <=0.25 SENSITIVE Sensitive     GENTAMICIN <=1 SENSITIVE Sensitive     IMIPENEM 4 SENSITIVE Sensitive     NITROFURANTOIN 256 RESISTANT Resistant     TRIMETH/SULFA <=20 SENSITIVE Sensitive     AMPICILLIN/SULBACTAM <=2 SENSITIVE Sensitive     PIP/TAZO <=4 SENSITIVE Sensitive     * >=100,000 COLONIES/mL PROTEUS MIRABILIS  Gastrointestinal Panel by PCR ,  Stool     Status: None   Collection Time: 06/20/16  9:07 AM  Result Value Ref Range Status   Campylobacter species NOT DETECTED NOT DETECTED Final   Plesimonas shigelloides NOT DETECTED NOT DETECTED Final   Salmonella species NOT DETECTED NOT DETECTED Final   Yersinia enterocolitica NOT DETECTED NOT DETECTED Final   Vibrio species NOT DETECTED NOT DETECTED Final   Vibrio cholerae NOT DETECTED NOT DETECTED Final   Enteroaggregative E coli (EAEC) NOT DETECTED NOT DETECTED Final   Enteropathogenic E coli (EPEC) NOT DETECTED NOT DETECTED Final   Enterotoxigenic E coli (ETEC) NOT DETECTED NOT DETECTED Final   Shiga like toxin producing E coli (STEC) NOT DETECTED NOT DETECTED Final   Shigella/Enteroinvasive E coli (EIEC) NOT DETECTED NOT DETECTED Final   Cryptosporidium NOT DETECTED NOT DETECTED Final   Cyclospora cayetanensis NOT DETECTED NOT DETECTED Final   Entamoeba histolytica NOT DETECTED NOT DETECTED Final   Giardia lamblia NOT DETECTED NOT DETECTED Final   Adenovirus F40/41 NOT DETECTED NOT DETECTED Final   Astrovirus NOT DETECTED NOT DETECTED Final   Norovirus GI/GII NOT DETECTED NOT DETECTED Final   Rotavirus A NOT DETECTED NOT DETECTED Final   Sapovirus (I, II, IV, and V) NOT DETECTED NOT DETECTED Final  C  difficile quick scan w PCR reflex     Status: None   Collection Time: 06/20/16  9:07 AM  Result Value Ref Range Status   C Diff antigen NEGATIVE NEGATIVE Final   C Diff toxin NEGATIVE NEGATIVE Final   C Diff interpretation No C. difficile detected.  Final         Radiology Studies: No results found.      Scheduled Meds: . aspirin EC  81 mg Oral q morning - 10a  . atorvastatin  20 mg Oral QPM  . cefTRIAXone (ROCEPHIN)  IV  2 g Intravenous Q24H  . enoxaparin (LOVENOX) injection  40 mg Subcutaneous Q24H  . insulin aspart  0-5 Units Subcutaneous QHS  . insulin aspart  0-9 Units Subcutaneous TID WC  . insulin aspart  3 Units Subcutaneous TID WC  . pantoprazole  40  mg Oral BID AC  . potassium chloride  40 mEq Oral Once  . potassium chloride  40 mEq Oral Q4H  . tamsulosin  0.4 mg Oral Daily   Continuous Infusions: . sodium chloride 100 mL/hr at 06/22/16 0653     LOS: 3 days    Time spent: 35 minutes. Greater than 50% of this time was spent in direct contact with the patient coordinating care.     Chaya JanHERNANDEZ ACOSTA,ESTELA, MD [facility] Pager [phone]  If 7PM-7AM, please contact night-coverage [email or web address] Password [email or web address] 06/22/2016, 5:30 PM

## 2016-06-22 NOTE — Care Management Note (Signed)
Case Management Note  Patient Details  Name: [name] MRN: [id number] Date of Birth: 1951-04-07  Expected Discharge Date:     06/23/2016             Expected Discharge Plan:  Home w Home Health Services  In-House Referral:  NA  Discharge planning Services  CM Consult  Post Acute Care Choice:  [facility]  HH Arranged:  PT HH Agency:  [facility]  Status of Service:  Completed, signed off  Additional Comments: [facility].  Malcolm Metrohildress, [name], RN 06/22/2016, 1:12 PM

## 2016-06-23 DIAGNOSIS — N132 Hydronephrosis with renal and ureteral calculous obstruction: Secondary | ICD-10-CM | POA: Diagnosis not present

## 2016-06-23 LAB — BASIC METABOLIC PANEL
ANION GAP: 8 (ref 5–15)
BUN: 11 mg/dL (ref 6–20)
CHLORIDE: 109 mmol/L (ref 101–111)
CO2: 23 mmol/L (ref 22–32)
Calcium: 8.9 mg/dL (ref 8.9–10.3)
Creatinine, Ser: 0.74 mg/dL (ref 0.61–1.24)
GFR calc Af Amer: >60 mL/min (ref >60)
GLUCOSE: 107 mg/dL — AB (ref 65–99)
POTASSIUM: 3.6 mmol/L (ref 3.5–5.1)
SODIUM: 140 mmol/L (ref 135–145)

## 2016-06-23 LAB — GLUCOSE, CAPILLARY
GLUCOSE-CAPILLARY: 122 mg/dL — AB (ref 65–99)
GLUCOSE-CAPILLARY: 101 mg/dL — AB (ref 65–99)

## 2016-06-23 MED ORDER — CIPROFLOXACIN HCL 500 MG PO TABS: 500.0000 mg | ORAL_TABLET | Freq: Two times a day (BID) | ORAL | 0 refills | Status: DC

## 2016-06-23 NOTE — Discharge Summary (Signed)
Physician Discharge Summary  [name] ZOX:[id number] DOB: December 09, 1950 DOA: 06/19/2016  PCP: Pearson Grippe, MD  Admit date: 06/19/2016 Discharge date: 06/23/2016  Time spent: 45 minutes  Recommendations for Outpatient Follow-up:  -Will be discharged home today. -ADvised to follow up with Urologist in 2 weeks.   Discharge Diagnoses:  Principal Problem:   Hydronephrosis with urinary obstruction due to ureteral calculus Active Problems:   Mental retardation   Hyperlipidemia   HTN (hypertension)   Fever   Generalized weakness   Diabetes mellitus type 2, controlled (HCC)   SIRS (systemic inflammatory response syndrome) (HCC)   UTI (urinary tract infection)   Hematuria   AKI (acute kidney injury) (HCC)   Hyponatremia   Hypokalemia   BPH (benign prostatic hyperplasia)   Sepsis secondary to UTI [facility])   Discharge Condition: Stable and improved  Filed Weights   06/19/16 1853 06/20/16 0635 06/20/16 1328  Weight: 72.5 kg (159 lb 12.8 oz) 72.4 kg (159 lb 9.8 oz) 72.1 kg (159 lb)    History of present illness:  As per Dr. Marland Mcalpine on 3/6: [name] is a 66 y.o. male who is non-verbal at baseline since childhood with some Congnitive Delay with a PMH of Hypertension, Hyperlipidemia, Diabetes Mellitus Type 2, BPH and Urinary Retention, Hx of Nephrolithiasis and other comorbids who presented to [facility] ED from home with a cc of Weakness, Diarrhea, poor po intake and Fall x2 in the last 24 hours. Patient is unable to provide a subjective history and only follows simple commands so a meaningful history was unobtainable from patient. Per Aunt, who is his HCPOA, patient has been having diarrhea for the last 2 weeks and has progressively gotten weaker and has not been eating as well. She states he has been complaining of abdominal pain and right sided flank pain and states that his urine has been bloody. No Nausea, Vomiting, SOB, Chest Pain or Cough noted. Per HCPOA, patient usually walks  normally and had a fall last night and early this AM when using the restroom. Patient was kicking the wall so the family member checked on him and found him on the floor. No focal weakness per the HCPOA but just generalized weakness. Patient was going to be D/C'd by the EDP but became orthostatic and spiked a temperature of 103 so [facility] was contacted to admit the patient for Generalized Weakness, and Fever.   ED Course: Given Liter Bolus x2, Placed on Broad Spectrum Abx, and Had Blood Cx and Blood work done. CXR was done and CT of Abd/Pelv was ordered but not done.  Hospital Course:   Sepsis secondary to UTI -Related to obstructing right ureteral stone. -Urology has been consulted and and patient is status post bilateral ureteral stents. Will need to follow-up with urology in 2-3 weeks for stone removal. -BC with kelbsiella, urine cx with proteus. Will DC on cipro for 10 days. -Hypotension resolved.  Generalized weakness -Due to sepsis plus minus diarrhea. -PT recs home health with supervision. Family aware.  Acute renal failure -Likely related to ureteral obstruction with stone, resolved after stent placement.  Hyponatremia, mild -Resolved.  Hypokalemia/Hypomagnesemia -Replaced. -Mg replaced.   Hypertension -Well controlled. -BP meds have been resumed  Type 2 diabetes -Well-controlled.  History of BPH -Continue Flomax   Procedures:  Bilateral Ureteral stent placement on 3/8  Consultations:  Urology, Dr. Ronne Binning  Discharge Instructions  Discharge Instructions    Diet - low sodium heart healthy    Complete by:  As directed    Increase activity slowly    Complete by:  As directed      Allergies as of 06/23/2016   No Known Allergies     Medication List    TAKE these medications   aspirin EC 81 MG tablet Take 81 mg by mouth every morning.   atorvastatin 20 MG tablet Commonly known as:  LIPITOR Take 20 mg by mouth every evening.   ciprofloxacin  500 MG tablet Commonly known as:  CIPRO Take 1 tablet (500 mg total) by mouth 2 (two) times daily.   lisinopril 10 MG tablet Commonly known as:  PRINIVIL,ZESTRIL Take 10 mg by mouth daily.   metFORMIN 500 MG 24 hr tablet Commonly known as:  GLUCOPHAGE-XR Take 2 tablets by mouth every morning.   pantoprazole 40 MG tablet Commonly known as:  PROTONIX Take 1 tablet (40 mg total) by mouth 2 (two) times daily before a meal.   tamsulosin 0.4 MG Caps capsule Commonly known as:  FLOMAX Take 0.4 mg by mouth daily.      No Known Allergies Follow-up Information    [facility] Follow up.   Contact information: [address] [place] [address] [phone]        Wilkie AyePatrick McKenzie, MD. Schedule an appointment as soon as possible for a visit in 2 week(s).   Specialty:  Urology Contact information: [address] [place] [address] [phone]            The results of significant diagnostics from this hospitalization (including imaging, microbiology, ancillary and laboratory) are listed below for reference.    Significant Diagnostic Studies: Ct Abdomen Pelvis Wo Contrast  Result Date: 06/19/2016 CLINICAL DATA:  Abdominal pain EXAM: CT ABDOMEN AND PELVIS WITHOUT CONTRAST TECHNIQUE: Multidetector CT imaging of the abdomen and pelvis was performed following the standard protocol without IV contrast. COMPARISON:  CT abdomen pelvis 05/07/2014 FINDINGS: Lower chest: Negative Hepatobiliary: Numerous small gallstones. No gallbladder wall thickening. Small calcified granuloma in the liver. No liver mass. Bile ducts nondilated. Pancreas: Negative Spleen: Negative Adrenals/Urinary Tract: Right hydronephrosis and hydroureter. Obstructing stone above the iliac crossing. This stone measures 11 x 8 mm. Numerous small left renal calculi in multiple calices and renal pelvis without hydronephrosis. No left ureteral stone. Bladder wall is diffusely thickened.  This has progressed in the interval. Stomach/Bowel: Dilated colon compatible with ileus. Mild small bowel dilatation. Moderate stool in the left colon. Vascular/Lymphatic: Mild atherosclerotic disease. Reproductive: Negative Other: None Musculoskeletal: Lumbar degenerative changes. Severe spinal stenosis at L4-5 due to spurring. Chronic compression fracture T12 IMPRESSION: 11 x 8 mm stone mid right ureter causing obstruction of the right kidney. Numerous small left renal calculi without obstruction. Extensive bladder wall thickening with progression Cholelithiasis without gallbladder thickening Severe spinal stenosis L4-5 due to bony overgrowth. Ileus Electronically Signed   By: Marlan Palauharles  Clark M.D.   On: 06/19/2016 20:18   Dg Chest 2 View  Result Date: 06/19/2016 CLINICAL DATA:  Weakness and fever EXAM: CHEST  2 VIEW COMPARISON:  Chest x-ray of May 07, 2014 FINDINGS: The lungs remain hypoinflated. There are coarse lung markings at both bases which are slightly more conspicuous today. The cardiac silhouette remains enlarged. The central pulmonary vascularity is prominent and there is mild cephalization of the vascular pattern. Within the upper abdomen there are loops of moderately distended gas-filled bowel. The degree of distention is not as great as that seen on the previous study. IMPRESSION: Bilateral hypoinflation.  CHF with mild pulmonary vascular congestion. Probable bibasilar subsegmental atelectasis. If the patient's clinical findings suggest pneumonia, followup PA and lateral chest X-ray would be recommended in 3-4 weeks following trial of antibiotic therapy to ensure resolution and exclude underlying malignancy. Electronically Signed   By: David  [place] M.D.   On: 06/19/2016 14:28   Dg C-arm 1-60 Min-no Report  Result Date: 06/20/2016 Fluoroscopy was utilized by the requesting physician.  No radiographic interpretation.    Microbiology: Recent Results (from the past 240 hour(s))  Blood  culture (routine x 2)     Status: None (Preliminary result)   Collection Time: 06/19/16  4:09 PM  Result Value Ref Range Status   Specimen Description BLOOD  Final   Special Requests NONE  Final   Culture NO GROWTH 3 DAYS  Final   Report Status PENDING  Incomplete  Blood culture (routine x 2)     Status: Abnormal   Collection Time: 06/19/16  4:22 PM  Result Value Ref Range Status   Specimen Description BLOOD  Final   Special Requests NONE  Final   Culture  Setup Time   Final    GRAM NEGATIVE RODS Gram Stain Report Called to,Read Back By and Verified With: TETREAULT H. AT [id number] ON [id number] BY THOMPSON S. Organism ID to follow CRITICAL RESULT CALLED TO, READ BACK BY AND VERIFIED WITH: B. [employer].D. 14:50 06/20/16 ([email or web address]) Performed at [facility], [address], [place], [place] [address]    Culture KLEBSIELLA PNEUMONIAE (A)  Final   Report Status 06/22/2016 FINAL  Final   Organism ID, Bacteria KLEBSIELLA PNEUMONIAE  Final      Susceptibility   Klebsiella pneumoniae - MIC*    AMPICILLIN >=32 RESISTANT Resistant     CEFAZOLIN <=4 SENSITIVE Sensitive     CEFEPIME <=1 SENSITIVE Sensitive     CEFTAZIDIME <=1 SENSITIVE Sensitive     CEFTRIAXONE <=1 SENSITIVE Sensitive     CIPROFLOXACIN <=0.25 SENSITIVE Sensitive     GENTAMICIN <=1 SENSITIVE Sensitive     IMIPENEM <=0.25 SENSITIVE Sensitive     TRIMETH/SULFA <=20 SENSITIVE Sensitive     AMPICILLIN/SULBACTAM 8 SENSITIVE Sensitive     PIP/TAZO <=4 SENSITIVE Sensitive     Extended ESBL NEGATIVE Sensitive     * KLEBSIELLA PNEUMONIAE  Blood Culture ID Panel (Reflexed)     Status: Abnormal   Collection Time: 06/19/16  4:22 PM  Result Value Ref Range Status   Enterococcus species NOT DETECTED NOT DETECTED Final   Listeria monocytogenes NOT DETECTED NOT DETECTED Final   Staphylococcus species NOT DETECTED NOT DETECTED Final   Staphylococcus aureus NOT DETECTED NOT DETECTED Final   Streptococcus species NOT DETECTED NOT DETECTED  Final   Streptococcus agalactiae NOT DETECTED NOT DETECTED Final   Streptococcus pneumoniae NOT DETECTED NOT DETECTED Final   Streptococcus pyogenes NOT DETECTED NOT DETECTED Final   Acinetobacter baumannii NOT DETECTED NOT DETECTED Final   Enterobacteriaceae species DETECTED (A) NOT DETECTED Final    Comment: Enterobacteriaceae represent a large family of gram-negative bacteria, not a single organism. CRITICAL RESULT CALLED TO, READ BACK BY AND VERIFIED WITH: B. [employer].D. 14:50 06/20/16 ([email or web address])    Enterobacter cloacae complex NOT DETECTED NOT DETECTED Final   Escherichia coli NOT DETECTED NOT DETECTED Final   Klebsiella oxytoca NOT DETECTED NOT DETECTED Final   Klebsiella pneumoniae DETECTED (A) NOT DETECTED Final    Comment: CRITICAL RESULT CALLED TO, READ BACK BY AND VERIFIED WITH: B. [employer].D. 14:50 06/20/16 (  [email or web address])    Proteus species NOT DETECTED NOT DETECTED Final   Serratia marcescens NOT DETECTED NOT DETECTED Final   Carbapenem resistance NOT DETECTED NOT DETECTED Final   Haemophilus influenzae NOT DETECTED NOT DETECTED Final   Neisseria meningitidis NOT DETECTED NOT DETECTED Final   Pseudomonas aeruginosa NOT DETECTED NOT DETECTED Final   Candida albicans NOT DETECTED NOT DETECTED Final   Candida glabrata NOT DETECTED NOT DETECTED Final   Candida krusei NOT DETECTED NOT DETECTED Final   Candida parapsilosis NOT DETECTED NOT DETECTED Final   Candida tropicalis NOT DETECTED NOT DETECTED Final    Comment: Performed at [facility], [address], [place], [place] [address]  Culture, Urine     Status: Abnormal   Collection Time: 06/19/16  5:09 PM  Result Value Ref Range Status   Specimen Description URINE, CLEAN CATCH  Final   Special Requests NONE  Final   Culture (A)  Final    >=100,000 COLONIES/mL KLEBSIELLA PNEUMONIAE >=100,000 COLONIES/mL PROTEUS MIRABILIS    Report Status 06/22/2016 FINAL  Final   Organism ID, Bacteria KLEBSIELLA PNEUMONIAE (A)   Final   Organism ID, Bacteria PROTEUS MIRABILIS (A)  Final      Susceptibility   Klebsiella pneumoniae - MIC*    AMPICILLIN >=32 RESISTANT Resistant     CEFAZOLIN <=4 SENSITIVE Sensitive     CEFTRIAXONE <=1 SENSITIVE Sensitive     CIPROFLOXACIN <=0.25 SENSITIVE Sensitive     GENTAMICIN <=1 SENSITIVE Sensitive     IMIPENEM <=0.25 SENSITIVE Sensitive     NITROFURANTOIN 64 INTERMEDIATE Intermediate     TRIMETH/SULFA <=20 SENSITIVE Sensitive     AMPICILLIN/SULBACTAM 4 SENSITIVE Sensitive     PIP/TAZO <=4 SENSITIVE Sensitive     Extended ESBL NEGATIVE Sensitive     * >=100,000 COLONIES/mL KLEBSIELLA PNEUMONIAE   Proteus mirabilis - MIC*    AMPICILLIN <=2 SENSITIVE Sensitive     CEFAZOLIN <=4 SENSITIVE Sensitive     CEFTRIAXONE <=1 SENSITIVE Sensitive     CIPROFLOXACIN <=0.25 SENSITIVE Sensitive     GENTAMICIN <=1 SENSITIVE Sensitive     IMIPENEM 4 SENSITIVE Sensitive     NITROFURANTOIN 256 RESISTANT Resistant     TRIMETH/SULFA <=20 SENSITIVE Sensitive     AMPICILLIN/SULBACTAM <=2 SENSITIVE Sensitive     PIP/TAZO <=4 SENSITIVE Sensitive     * >=100,000 COLONIES/mL PROTEUS MIRABILIS  Gastrointestinal Panel by PCR , Stool     Status: None   Collection Time: 06/20/16  9:07 AM  Result Value Ref Range Status   Campylobacter species NOT DETECTED NOT DETECTED Final   Plesimonas shigelloides NOT DETECTED NOT DETECTED Final   Salmonella species NOT DETECTED NOT DETECTED Final   Yersinia enterocolitica NOT DETECTED NOT DETECTED Final   Vibrio species NOT DETECTED NOT DETECTED Final   Vibrio cholerae NOT DETECTED NOT DETECTED Final   Enteroaggregative E coli (EAEC) NOT DETECTED NOT DETECTED Final   Enteropathogenic E coli (EPEC) NOT DETECTED NOT DETECTED Final   Enterotoxigenic E coli (ETEC) NOT DETECTED NOT DETECTED Final   Shiga like toxin producing E coli (STEC) NOT DETECTED NOT DETECTED Final   Shigella/Enteroinvasive E coli (EIEC) NOT DETECTED NOT DETECTED Final   Cryptosporidium NOT  DETECTED NOT DETECTED Final   Cyclospora cayetanensis NOT DETECTED NOT DETECTED Final   Entamoeba histolytica NOT DETECTED NOT DETECTED Final   Giardia lamblia NOT DETECTED NOT DETECTED Final   Adenovirus F40/41 NOT DETECTED NOT DETECTED Final   Astrovirus NOT DETECTED NOT DETECTED Final  Norovirus GI/GII NOT DETECTED NOT DETECTED Final   Rotavirus A NOT DETECTED NOT DETECTED Final   Sapovirus (I, II, IV, and V) NOT DETECTED NOT DETECTED Final  C difficile quick scan w PCR reflex     Status: None   Collection Time: 06/20/16  9:07 AM  Result Value Ref Range Status   C Diff antigen NEGATIVE NEGATIVE Final   C Diff toxin NEGATIVE NEGATIVE Final   C Diff interpretation No C. difficile detected.  Final     Labs: Basic Metabolic Panel:  Recent Labs Lab 06/19/16 1344 06/20/16 0524 06/20/16 2313 06/21/16 0515 06/22/16 0458 06/22/16 0744 06/23/16 1133  NA 133* 134*  --  136 138  --  140  K 3.4* 3.3*  --  2.7* 2.7*  --  3.6  CL 98* 103  --  108 108  --  109  CO2 26 22  --  21* 24  --  23  GLUCOSE 135* 135*  --  143* 155*  --  107*  BUN 26* 32*  --  21* 10  --  11  CREATININE 1.47* 1.68*  --  1.00 0.84  --  0.74  CALCIUM 8.7* 8.0*  --  7.9* 7.9*  --  8.9  MG  --   --  1.4*  --  1.9 2.0  --    Liver Function Tests:  Recent Labs Lab 06/19/16 1344 06/20/16 0524  AST 36 42*  ALT 14* 16*  ALKPHOS 49 41  BILITOT 1.0 1.3*  PROT 7.3 6.6  ALBUMIN 3.6 3.1*    Recent Labs Lab 06/19/16 1344  LIPASE 11   No results for input(s): AMMONIA in the last 168 hours. CBC:  Recent Labs Lab 06/19/16 1344 06/20/16 0524 06/21/16 0515 06/22/16 0458  WBC 9.2 6.0 6.8 6.8  HGB 11.7* 12.0* 10.9* 10.6*  HCT 35.6* 36.5* 32.9* 31.8*  MCV 90.4 89.7 88.9 89.6  PLT 115* 102* 124* 142*   Cardiac Enzymes: No results for input(s): CKTOTAL, CKMB, CKMBINDEX, TROPONINI in the last 168 hours. BNP: BNP (last 3 results) No results for input(s): BNP in the last 8760 hours.  ProBNP (last 3  results) No results for input(s): PROBNP in the last 8760 hours.  CBG:  Recent Labs Lab 06/22/16 1059 06/22/16 1634 06/22/16 2117 06/23/16 0801 06/23/16 1118  GLUCAP 96 119* 114* 122* 101*       Signed:  HERNANDEZ ACOSTA,ESTELA  [facility] Pager: [phone] 06/23/2016, 4:47 PM

## 2016-06-23 NOTE — Progress Notes (Signed)
[facility] contacted and informed about the patient's discharge to ensure continuity of care for the patient.

## 2016-06-23 NOTE — Progress Notes (Signed)
Discharge instructions gone over with patient and family. Verbalized understanding. IV removed with no difficulty. Patient tolerated procedure well.

## 2016-06-24 LAB — CULTURE, BLOOD (ROUTINE X 2): Culture: NO GROWTH

## 2016-07-04 ENCOUNTER — Encounter (HOSPITAL_COMMUNITY): Payer: Self-pay | Admitting: *Deleted

## 2016-07-04 ENCOUNTER — Emergency Department (HOSPITAL_COMMUNITY)
Admission: EM | Admit: 2016-07-04 | Discharge: 2016-07-04 | Disposition: A | Discharge status: Home / Self Care | Payer: Medicare Other | Attending: Emergency Medicine | Admitting: Emergency Medicine

## 2016-07-04 DIAGNOSIS — Y929 Unspecified place or not applicable: Secondary | ICD-10-CM | POA: Diagnosis not present

## 2016-07-04 DIAGNOSIS — S80811A Abrasion, right lower leg, initial encounter: Secondary | ICD-10-CM | POA: Diagnosis not present

## 2016-07-04 DIAGNOSIS — Z7982 Long term (current) use of aspirin: Secondary | ICD-10-CM | POA: Diagnosis not present

## 2016-07-04 DIAGNOSIS — Y939 Activity, unspecified: Secondary | ICD-10-CM | POA: Insufficient documentation

## 2016-07-04 DIAGNOSIS — I10 Essential (primary) hypertension: Secondary | ICD-10-CM | POA: Insufficient documentation

## 2016-07-04 DIAGNOSIS — S8992XA Unspecified injury of left lower leg, initial encounter: Secondary | ICD-10-CM | POA: Diagnosis present

## 2016-07-04 DIAGNOSIS — X58XXXA Exposure to other specified factors, initial encounter: Secondary | ICD-10-CM | POA: Diagnosis not present

## 2016-07-04 DIAGNOSIS — Z7984 Long term (current) use of oral hypoglycemic drugs: Secondary | ICD-10-CM | POA: Insufficient documentation

## 2016-07-04 DIAGNOSIS — R609 Edema, unspecified: Secondary | ICD-10-CM

## 2016-07-04 DIAGNOSIS — T07XXXA Unspecified multiple injuries, initial encounter: Secondary | ICD-10-CM

## 2016-07-04 DIAGNOSIS — S80812A Abrasion, left lower leg, initial encounter: Secondary | ICD-10-CM | POA: Diagnosis not present

## 2016-07-04 DIAGNOSIS — E119 Type 2 diabetes mellitus without complications: Secondary | ICD-10-CM | POA: Insufficient documentation

## 2016-07-04 DIAGNOSIS — Y999 Unspecified external cause status: Secondary | ICD-10-CM | POA: Diagnosis not present

## 2016-07-04 LAB — BASIC METABOLIC PANEL
Anion gap: 7 (ref 5–15)
BUN: 6 mg/dL (ref 6–20)
CALCIUM: 8.5 mg/dL — AB (ref 8.9–10.3)
CO2: 34 mmol/L — AB (ref 22–32)
CREATININE: 0.77 mg/dL (ref 0.61–1.24)
Chloride: 97 mmol/L — ABNORMAL LOW (ref 101–111)
GFR calc non Af Amer: >60 mL/min (ref >60)
Glucose, Bld: 127 mg/dL — ABNORMAL HIGH (ref 65–99)
Potassium: 2.9 mmol/L — ABNORMAL LOW (ref 3.5–5.1)
Sodium: 138 mmol/L (ref 135–145)

## 2016-07-04 LAB — CBC WITH DIFFERENTIAL/PLATELET
BASOS PCT: 1 %
Basophils Absolute: 0 10*3/uL (ref 0.0–0.1)
EOS ABS: 0.1 10*3/uL (ref 0.0–0.7)
EOS PCT: 2 %
HCT: 33.1 % — ABNORMAL LOW (ref 39.0–52.0)
Hemoglobin: 10.5 g/dL — ABNORMAL LOW (ref 13.0–17.0)
Lymphocytes Relative: 18 %
Lymphs Abs: 1 10*3/uL (ref 0.7–4.0)
MCH: 29.4 pg (ref 26.0–34.0)
MCHC: 31.7 g/dL (ref 30.0–36.0)
MCV: 92.7 fL (ref 78.0–100.0)
MONO ABS: 0.5 10*3/uL (ref 0.1–1.0)
MONOS PCT: 10 %
NEUTROS PCT: 69 %
Neutro Abs: 3.8 10*3/uL (ref 1.7–7.7)
PLATELETS: 246 10*3/uL (ref 150–400)
RBC: 3.57 MIL/uL — ABNORMAL LOW (ref 4.22–5.81)
RDW: 14.2 % (ref 11.5–15.5)
WBC: 5.5 10*3/uL (ref 4.0–10.5)

## 2016-07-04 MED ORDER — POTASSIUM CHLORIDE CRYS ER 20 MEQ PO TBCR: 20.0000 meq | EXTENDED_RELEASE_TABLET | Freq: Every day | ORAL | 0 refills | Status: DC

## 2016-07-04 NOTE — ED Provider Notes (Signed)
[facility]PT Provider Note   CSN: [id number] Arrival date & time: 07/04/16  [id number]   By signing my name below, I, [name], attest that this documentation has been prepared under the direction and in the presence of [name] Tavarius Grewe, MD. Electronically Signed: [name], Scribe. 07/04/16. 10:20 AM. History   Chief Complaint Chief Complaint  Patient presents with  . Leg Swelling    LEVEL 5 CAVEAT: Mental Retardation The history is provided by the patient. No language interpreter was used.  HPI Comments: [name] is a 66 y.o. male who presents to the Emergency Department complaining of bilateral leg swelling for the past couple of weeks. Per Family: The patient was seen on 06/28/2016 for this problem and was given abx at this time. The patient may have finished his course of abx per family. The patient was also told to elevate his feet daily to help with the swelling. The patient has been putting his feet in a chair with pillows underneath his legs to elevate them. The patient typically is able to ambulate well. He has no known cardiac problems. He has a hx of diabetes which he takes metformin. The patient has been out of metformin for the past few days and has ordered some more.  Past Medical History:  Diagnosis Date  . Diabetes mellitus without complication (HCC)   . Hypertension   . Urinary retention     Patient Active Problem List   Diagnosis Date Noted  . Hydronephrosis with urinary obstruction due to ureteral calculus 06/20/2016  . Sepsis secondary to UTI (HCC) 06/20/2016  . Fever 06/19/2016  . Generalized weakness 06/19/2016  . Diabetes mellitus type 2, controlled (HCC) 06/19/2016  . SIRS (systemic inflammatory response syndrome) (HCC) 06/19/2016  . UTI (urinary tract infection) 06/19/2016  . Hematuria 06/19/2016  . AKI (acute kidney injury) (HCC) 06/19/2016  . Hyponatremia 06/19/2016  . Hypokalemia 06/19/2016  . BPH (benign prostatic hyperplasia)  06/19/2016  . 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 Urinary retention 05/07/2014  . Mental retardation 05/07/2014  . Hyperlipidemia 05/07/2014  . HTN (hypertension) 05/07/2014    Past Surgical History:  Procedure Laterality Date  . CYSTOSCOPY W/ URETERAL STENT PLACEMENT Bilateral 06/20/2016   Procedure: CYSTOSCOPY WITH RETROGRADE PYELOGRAM/URETERAL STENT PLACEMENT;  Surgeon: Malen GauzePatrick L McKenzie, MD;  Location: [facility];  Service: Urology;  Laterality: Bilateral;       Home Medications    Prior to Admission medications   Medication Sig Start Date End Date Taking? Authorizing Provider  aspirin EC 81 MG tablet Take 81 mg by mouth every morning.    Yes Historical Provider, MD  atorvastatin (LIPITOR) 20 MG tablet Take 20 mg by mouth every evening.    Yes Historical Provider, MD  ciprofloxacin (CIPRO) 500 MG tablet Take 1 tablet (500 mg total) by mouth 2 (two) times daily. 06/23/16  Yes Henderson CloudEstela Y Hernandez Acosta, MD  metFORMIN (GLUCOPHAGE-XR) 500 MG 24 hr tablet Take 2 tablets by mouth every morning.  04/22/14  Yes Historical Provider, MD  pantoprazole (PROTONIX) 40 MG tablet Take 1 tablet (40 mg total) by mouth 2 (two) times daily before a meal. 05/11/14  Yes Angus McInnis, MD  tamsulosin (FLOMAX) 0.4 MG CAPS capsule Take 0.4 mg by mouth daily.   Yes Historical Provider, MD  potassium chloride SA (K-DUR,KLOR-CON) 20 MEQ tablet Take 1 tablet (20 mEq total) by mouth daily. 07/04/16   [name] Levi Karstens, MD    Family History No family history on file.  Social History Social  History  Substance Use Topics  . Smoking status: Never Smoker  . Smokeless tobacco: Never Used  . Alcohol use No     Allergies   Patient has no known allergies.   Review of Systems Review of Systems  Unable to perform ROS: Other (Mental Retardation)     Physical Exam Updated Vital Signs BP (!) 100/56   Pulse 60   Temp 97.6 F (36.4 C) (Oral)   Resp 18   Ht 5\' 10"  (1.778 m)   Wt 160 lb (72.6 kg)   SpO2 97%    BMI 22.96 kg/m   Physical Exam  Constitutional: He appears well-developed and well-nourished.  He is nonverbal  HENT:  Head: Normocephalic and atraumatic.  Right Ear: External ear normal.  Left Ear: External ear normal.  Eyes: Conjunctivae and EOM are normal. Pupils are equal, round, and reactive to light.  Neck: Normal range of motion and phonation normal. Neck supple.  Cardiovascular: Normal rate, regular rhythm and normal heart sounds.   Pulmonary/Chest: Effort normal and breath sounds normal. He exhibits no bony tenderness.  Abdominal: Soft. There is no tenderness.  Musculoskeletal: Normal range of motion. He exhibits edema.  2+ lower leg swelling with scatered abrasion with blisters on each leg, 1.5 cm each.   Neurological: He is alert. No cranial nerve deficit or sensory deficit. He exhibits normal muscle tone. Coordination normal.  Skin: Skin is warm, dry and intact.  Psychiatric: He has a normal mood and affect. His behavior is normal.  Nursing note and vitals reviewed.    ED Treatments / Results  DIAGNOSTIC STUDIES: Oxygen Saturation is 97% on RA, normal by my interpretation.    COORDINATION OF CARE: 9:24 AM Discussed treatment plan with pt at bedside, which includes labs, and pt agreed to plan.  Labs (all labs ordered are listed, but only abnormal results are displayed) Labs Reviewed  BASIC METABOLIC PANEL - Abnormal; Notable for the following:       Result Value   Potassium 2.9 (*)    Chloride 97 (*)    CO2 34 (*)    Glucose, Bld 127 (*)    Calcium 8.5 (*)    All other components within normal limits  CBC WITH DIFFERENTIAL/PLATELET - Abnormal; Notable for the following:    RBC 3.57 (*)    Hemoglobin 10.5 (*)    HCT 33.1 (*)    All other components within normal limits    EKG  EKG Interpretation None       Radiology No results found.  Procedures Procedures (including critical care time)  Medications Ordered in ED Medications - No data to  display   Initial Impression / Assessment and Plan / ED Course  I have reviewed the triage vital signs and the nursing notes.  Pertinent labs & imaging results that were available during my care of the patient were reviewed by me and considered in my medical decision making (see chart for details).  Clinical Course as of Jul 05 1018  Wed Jul 04, 2016  1002 Low Potassium: (!) 2.9 [EW]  1003 Mild elevation Glucose: (!) 127 [EW]  1003 Low Chloride: (!) 97 [EW]  1003 Normal WBC: 5.5 [EW]  1003 Low Hemoglobin: (!) 10.5 [EW]    Clinical Course User Index [EW] Levi Bale, MD    Medications - No data to display  Patient Vitals for the past 24 hrs:  BP Temp Temp src Pulse Resp SpO2 Height Weight  07/04/16 1000 (!) 100/56 - -  60 - 97 % - -  07/04/16 0930 (!) 104/52 - - - - - - -  07/04/16 0900 (!) 111/50 - - - - - - -  07/04/16 0832 (!) 109/52 97.6 F (36.4 C) Oral 89 18 97 % - -  07/04/16 0831 - - - - - - 5\' 10"  (1.778 m) 160 lb (72.6 kg)    10:20 AM Reevaluation with update and discussion. After initial assessment and treatment, an updated evaluation reveals no change in clinical status.  Findings discussed with patient's family member, all questions answered. Sirus Labrie L    Final Clinical Impressions(s) / ED Diagnoses   Final diagnoses:  Peripheral edema  Abrasions of multiple sites    Leg edema, with abrasions, and blistering, without evidence for acute infection.  Wounds can be treated symptomatically, with warm compresses, and leg elevation for edema.  He has a short-term follow-up already scheduled.  Nursing Notes Reviewed/ Care Coordinated Applicable Imaging Reviewed Interpretation of Laboratory Data incorporated into ED treatment  The patient appears reasonably screened and/or stabilized for discharge and I doubt any other medical condition or other [facility] requiring further screening, evaluation, or treatment in the ED at this time prior to discharge.  Plan: Home  Medications-continue usual medications; Home Treatments-leg elevation and warm compresses; return here if the recommended treatment, does not improve the symptoms; Recommended follow up-PCP on 07/14/2016 as scheduled   New Prescriptions New Prescriptions   POTASSIUM CHLORIDE SA (K-DUR,KLOR-CON) 20 MEQ TABLET    Take 1 tablet (20 mEq total) by mouth daily.   I personally performed the services described in this documentation, which was scribed in my presence. The recorded information has been reviewed and is accurate.   Levi Bale, MD 07/04/16 1023

## 2016-07-04 NOTE — Discharge Instructions (Signed)
Elevate his legs above his heart, as much as possible.  Use a warm moist compress, on the source of the legs, 3 or 4 times a day to improve their healing.  Follow up with your primary care doctor, as scheduled, in 10 days.

## 2016-07-04 NOTE — ED Triage Notes (Signed)
Pt comes in with his aunt who is also his POA. She states patient has been having bilateral leg swelling starting on the 15th of this month. Since then swelling has gotten worse and he now has some blisters and redness noted.

## 2016-07-17 ENCOUNTER — Other Ambulatory Visit (HOSPITAL_COMMUNITY): Payer: Self-pay | Admitting: Internal Medicine

## 2016-07-17 DIAGNOSIS — R609 Edema, unspecified: Secondary | ICD-10-CM

## 2016-07-17 DIAGNOSIS — R600 Localized edema: Secondary | ICD-10-CM

## 2016-07-19 ENCOUNTER — Other Ambulatory Visit: Payer: Self-pay | Admitting: Urology

## 2016-07-24 ENCOUNTER — Encounter (HOSPITAL_COMMUNITY): Payer: Self-pay

## 2016-07-24 ENCOUNTER — Ambulatory Visit (HOSPITAL_COMMUNITY): Payer: Medicare Other | Attending: Internal Medicine

## 2016-08-13 NOTE — Patient Instructions (Signed)
[name]  08/13/2016     @PREFPERIOPPHARMACY @   Your procedure is scheduled on  5/70/2018  Report to [facility] at  800   A.M.  Call this number if you have problems the morning of surgery:  [phone]   Remember:  Do not eat food or drink liquids after midnight.  Take these medicines the morning of surgery with A SIP OF WATER  protonix, flomax. DO NOT take any medications for diabetes the morning of your surgery.   Do not wear jewelry, make-up or nail polish.  Do not wear lotions, powders, or perfumes, or deoderant.  Do not shave 48 hours prior to surgery.  Men may shave face and neck.  Do not bring valuables to the hospital.  [facility] is not responsible for any belongings or valuables.  Contacts, dentures or bridgework may not be worn into surgery.  Leave your suitcase in the car.  After surgery it may be brought to your room.  For patients admitted to the hospital, discharge time will be determined by your treatment team.  Patients discharged the day of surgery will not be allowed to drive home.   Name and phone number of your driver:   family Special instructions:  None  Please read over the following fact sheets that you were given. Anesthesia Post-op Instructions and Care and Recovery After Surgery       Ureteral Stent Implantation Ureteral stent implantation is a procedure to insert (implant) a flexible, soft, plastic tube (stent) into a tube (ureter) that drains urine from the kidneys. The stent supports the ureter while it heals and helps to drain urine from the kidneys. You may have a ureteral stent implanted after having a procedure to remove a blockage from the ureter (ureterolysis or pyeloplasty).You may also have a stent implanted to open the flow of urine when you have a blockage caused by a kidney stone, tumor, blood clot, or infection. You have two ureters, one on each side of the body. The ureters connect the  kidneys to the organ that holds urine until it passes out of the body (bladder). The stent is placed so that one end is in the kidney, and one end is in the bladder. The stent is usually taken out after your ureter has healed. Depending on your condition, you may have a stent for just a few weeks, or you may have a long-term stent that will need to be replaced every few months. Tell a health care provider about:  Any allergies you have.  All medicines you are taking, including vitamins, herbs, eye drops, creams, and over-the-counter medicines.  Any problems you or family members have had with anesthetic medicines.  Any blood disorders you have.  Any surgeries you have had.  Any medical conditions you have.  Whether you are pregnant or may be pregnant. What are the risks? Generally, this is a safe procedure. However, problems may occur, including:  Infection.  Bleeding.  Allergic reactions to medicines.  Damage to other structures or organs. Tearing (perforation) of the ureter is possible.  Movement of the stent away from where it is placed during surgery (migration). What happens before the procedure?  Ask your health care provider about:  Changing or stopping your regular medicines. This is especially important if you are taking diabetes medicines or blood thinners.  Taking medicines such as aspirin and ibuprofen. These  medicines can thin your blood. Do not take these medicines before your procedure if your health care provider instructs you not to.  Follow instructions from your health care provider about eating or drinking restrictions.  Do not drink alcohol and do not use any tobacco products before your procedure, as told by your health care provider.  You may be given antibiotic medicine to help prevent infection.  Plan to have someone take you home after the procedure.  If you go home right after the procedure, plan to have someone with you for 24 hours. What  happens during the procedure?  An IV tube will be inserted into one of your veins.  You will be given a medicine to make you fall asleep (general anesthetic). You may also be given a medicine to help you relax (sedative).  A thin, tube-shaped instrument with a light and tiny camera at the end (cystoscope) will be inserted into your urethra. The urethra is the tube that drains urine from the bladder out of the body. In men, the urethra opens at the end of the penis. In women, the urethra opens in front of the vaginal opening.  The cystoscope will be passed into your bladder.  A thin wire (guide wire) will be passed through your bladder and into your ureter. This is used to guide the stent into your ureter.  The stent will be inserted into your ureter.  The guide wire and the cystoscope will be removed.  A flexible tube (catheter) will be inserted through your urethra so that one end is in your bladder. This helps to drain urine from your bladder. The procedure may vary among hospitals and health care providers. What happens after the procedure?  Your blood pressure, heart rate, breathing rate, and blood oxygen level will be monitored often until the medicines you were given have worn off.  You may continue to receive medicine and fluids through an IV tube.  You may have some soreness or pain in your abdomen and urethra. Medicines will be available to help you.  You will be encouraged to get up and walk around as soon as you can.  You will have a catheter draining your urine.  You will have some blood in your urine.  Do not drive for 24 hours if you received a sedative. This information is not intended to replace advice given to you by your health care provider. Make sure you discuss any questions you have with your health care provider. Document Released: 03/30/2000 Document Revised: 09/08/2015 Document Reviewed: 10/15/2014 Elsevier Interactive Patient Education  2017 [employer]  Ureteral Stent Implantation, Care After Refer to this sheet in the next few weeks. These instructions provide you with information about caring for yourself after your procedure. Your health care provider may also give you more specific instructions. Your treatment has been planned according to current medical practices, but problems sometimes occur. Call your health care provider if you have any problems or questions after your procedure. What can I expect after the procedure? After the procedure, it is common to have:  Nausea.  Mild pain when you urinate. You may feel this pain in your lower back or lower abdomen. Pain should stop within a few minutes after you urinate. This may last for up to 1 week.  A small amount of blood in your urine for several days. Follow these instructions at home:   Medicines   Take over-the-counter and prescription medicines only as told by your health  care provider.  If you were prescribed an antibiotic medicine, take it as told by your health care provider. Do not stop taking the antibiotic even if you start to feel better.  Do not drive for 24 hours if you received a sedative.  Do not drive or operate heavy machinery while taking prescription pain medicines. Activity   Return to your normal activities as told by your health care provider. Ask your health care provider what activities are safe for you.  Do not lift anything that is heavier than 10 lb (4.5 kg). Follow this limit for 1 week after your procedure, or for as long as told by your health care provider. General instructions   Watch for any blood in your urine. Call your health care provider if the amount of blood in your urine increases.  If you have a catheter:  Follow instructions from your health care provider about taking care of your catheter and collection bag.  Do not take baths, swim, or use a hot tub until your health care provider approves.  Drink enough fluid to keep  your urine clear or pale yellow.  Keep all follow-up visits as told by your health care provider. This is important. Contact a health care provider if:  You have pain that gets worse or does not get better with medicine, especially pain when you urinate.  You have difficulty urinating.  You feel nauseous or you vomit repeatedly during a period of more than 2 days after the procedure. Get help right away if:  Your urine is dark red or has blood clots in it.  You are leaking urine (have incontinence).  The end of the stent comes out of your urethra.  You cannot urinate.  You have sudden, sharp, or severe pain in your abdomen or lower back.  You have a fever. This information is not intended to replace advice given to you by your health care provider. Make sure you discuss any questions you have with your health care provider. Document Released: 12/03/2012 Document Revised: 09/08/2015 Document Reviewed: 10/15/2014 Elsevier Interactive Patient Education  2017 [employer]  Kidney Stones Kidney stones (urolithiasis) are rock-like masses that form inside of the kidneys. Kidneys are organs that make pee (urine). A kidney stone can cause very bad pain and can block the flow of pee. The stone usually leaves your body (passes) through your pee. You may need to have a doctor take out the stone. Follow these instructions at home: Eating and drinking   Drink enough fluid to keep your pee clear or pale yellow. This will help you pass the stone.  If told by your doctor, change the foods you eat (your diet). This may include:  Limiting how much salt (sodium) you eat.  Eating more fruits and vegetables.  Limiting how much meat, poultry, fish, and eggs you eat.  Follow instructions from your doctor about eating or drinking restrictions. General instructions   Collect pee samples as told by your doctor. You may need to collect a pee sample:  24 hours after a stone comes out.  8-12  weeks after a stone comes out, and every 6-12 months after that.  Strain your pee every time you pee (urinate), for as long as told. Use the strainer that your doctor recommends.  Do not throw out the stone. Keep it so that it can be tested by your doctor.  Take over-the-counter and prescription medicines only as told by your doctor.  Keep all follow-up visits as  told by your doctor. This is important. You may need follow-up tests. Preventing kidney stones  To prevent another kidney stone:  Drink enough fluid to keep your pee clear or pale yellow. This is the best way to prevent kidney stones.  Eat healthy foods.  Avoid certain foods as told by your doctor. You may be told to eat less protein.  Stay at a healthy weight. Contact a doctor if:  You have pain that gets worse or does not get better with medicine. Get help right away if:  You have a fever or chills.  You get very bad pain.  You get new pain in your belly (abdomen).  You pass out (faint).  You cannot pee. This information is not intended to replace advice given to you by your health care provider. Make sure you discuss any questions you have with your health care provider. Document Released: 09/19/2007 Document Revised: 12/20/2015 Document Reviewed: 12/20/2015 Elsevier Interactive Patient Education  2017 [employer]  Cystoscopy Cystoscopy is a procedure that is used to help diagnose and sometimes treat conditions that affect that lower urinary tract. The lower urinary tract includes the bladder and the tube that drains urine from the bladder out of the body (urethra). Cystoscopy is performed with a thin, tube-shaped instrument with a light and camera at the end (cystoscope). The cystoscope may be hard (rigid) or flexible, depending on the goal of the procedure.The cystoscope is inserted through the urethra, into the bladder. Cystoscopy may be recommended if you have:  Urinary tractinfections that keep coming  back (recurring).  Blood in the urine (hematuria).  Loss of bladder control (urinary incontinence) or an overactive bladder.  Unusual cells found in a urine sample.  A blockage in the urethra.  Painful urination.  An abnormality in the bladder found during an intravenous pyelogram (IVP) or CT scan. Cystoscopy may also be done to remove a sample of tissue to be examined under a microscope (biopsy). Tell a health care provider about:  Any allergies you have.  All medicines you are taking, including vitamins, herbs, eye drops, creams, and over-the-counter medicines.  Any problems you or family members have had with anesthetic medicines.  Any blood disorders you have.  Any surgeries you have had.  Any medical conditions you have.  Whether you are pregnant or may be pregnant. What are the risks? Generally, this is a safe procedure. However, problems may occur, including:  Infection.  Bleeding.  Allergic reactions to medicines.  Damage to other structures or organs. What happens before the procedure?  Ask your health care provider about:  Changing or stopping your regular medicines. This is especially important if you are taking diabetes medicines or blood thinners.  Taking medicines such as aspirin and ibuprofen. These medicines can thin your blood. Do not take these medicines before your procedure if your health care provider instructs you not to.  Follow instructions from your health care provider about eating or drinking restrictions.  You may be given antibiotic medicine to help prevent infection.  You may have an exam or testing, such as X-rays of the bladder, urethra, or kidneys.  You may have urine tests to check for signs of infection.  Plan to have someone take you home after the procedure. What happens during the procedure?  To reduce your risk of infection,your health care team will wash or sanitize their hands.  You will be given one or more of the  following:  A medicine to help you relax (sedative).  A medicine to numb the area (local anesthetic).  The area around the opening of your urethra will be cleaned.  The cystoscope will be passed through your urethra into your bladder.  Germ-free (sterile)fluid will flow through the cystoscope to fill your bladder. The fluid will stretch your bladder so that your surgeon can clearly examine your bladder walls.  The cystoscope will be removed and your bladder will be emptied. The procedure may vary among health care providers and hospitals. What happens after the procedure?  You may have some soreness or pain in your abdomen and urethra. Medicines will be available to help you.  You may have some blood in your urine.  Do not drive for 24 hours if you received a sedative. This information is not intended to replace advice given to you by your health care provider. Make sure you discuss any questions you have with your health care provider. Document Released: 03/30/2000 Document Revised: 08/11/2015 Document Reviewed: 02/17/2015 Elsevier Interactive Patient Education  2017 [employer]  Cystoscopy, Care After Refer to this sheet in the next few weeks. These instructions provide you with information about caring for yourself after your procedure. Your health care provider may also give you more specific instructions. Your treatment has been planned according to current medical practices, but problems sometimes occur. Call your health care provider if you have any problems or questions after your procedure. What can I expect after the procedure? After the procedure, it is common to have:  Mild pain when you urinate. Pain should stop within a few minutes after you urinate. This may last for up to 1 week.  A small amount of blood in your urine for several days.  Feeling like you need to urinate but producing only a small amount of urine. Follow these instructions at home:   Medicines     Take over-the-counter and prescription medicines only as told by your health care provider.  If you were prescribed an antibiotic medicine, take it as told by your health care provider. Do not stop taking the antibiotic even if you start to feel better. General instructions    Return to your normal activities as told by your health care provider. Ask your health care provider what activities are safe for you.  Do not drive for 24 hours if you received a sedative.  Watch for any blood in your urine. If the amount of blood in your urine increases, call your health care provider.  Follow instructions from your health care provider about eating or drinking restrictions.  If a tissue sample was removed for testing (biopsy) during your procedure, it is your responsibility to get your test results. Ask your health care provider or the department performing the test when your results will be ready.  Drink enough fluid to keep your urine clear or pale yellow.  Keep all follow-up visits as told by your health care provider. This is important. Contact a health care provider if:  You have pain that gets worse or does not get better with medicine, especially pain when you urinate.  You have difficulty urinating. Get help right away if:  You have more blood in your urine.  You have blood clots in your urine.  You have abdominal pain.  You have a fever or chills.  You are unable to urinate. This information is not intended to replace advice given to you by your health care provider. Make sure you discuss any questions you have with your health care provider.  Document Released: 10/20/2004 Document Revised: 09/08/2015 Document Reviewed: 02/17/2015 Elsevier Interactive Patient Education  2017 [employer]  Ureteroscopy Ureteroscopy is a procedure to check for and treat problems inside part of the urinary tract. In this procedure, a thin, tube-shaped instrument with a light at the end  (ureteroscope) is used to look at the inside of the kidneys and the ureters, which are the tubes that carry urine from the kidneys to the bladder. The ureteroscope is inserted into one or both of the ureters. You may need this procedure if you have frequent urinary tract infections (UTIs), blood in your urine, or a stone in one of your ureters. A ureteroscopy can be done to find the cause of urine blockage in a ureter and to evaluate other abnormalities inside the ureters or kidneys. If stones are found, they can be removed during the procedure. Polyps, abnormal tissue, and some types of tumors can also be removed or treated. The ureteroscope may also have a tool to remove tissue to be checked for disease under a microscope (biopsy). Tell a health care provider about:  Any allergies you have.  All medicines you are taking, including vitamins, herbs, eye drops, creams, and over-the-counter medicines.  Any problems you or family members have had with anesthetic medicines.  Any blood disorders you have.  Any surgeries you have had.  Any medical conditions you have.  Whether you are pregnant or may be pregnant. What are the risks? Generally, this is a safe procedure. However, problems may occur, including:  Bleeding.  Infection.  Allergic reactions to medicines.  Scarring that narrows the ureter (stricture).  Creating a hole in the ureter (perforation). What happens before the procedure? Staying hydrated  Follow instructions from your health care provider about hydration, which may include:  Up to 2 hours before the procedure - you may continue to drink clear liquids, such as water, clear fruit juice, black coffee, and plain tea. Eating and drinking restrictions  Follow instructions from your health care provider about eating and drinking, which may include:  8 hours before the procedure - stop eating heavy meals or foods such as meat, fried foods, or fatty foods.  6 hours before  the procedure - stop eating light meals or foods, such as toast or cereal.  6 hours before the procedure - stop drinking milk or drinks that contain milk.  2 hours before the procedure - stop drinking clear liquids. Medicines   Ask your health care provider about:  Changing or stopping your regular medicines. This is especially important if you are taking diabetes medicines or blood thinners.  Taking medicines such as aspirin and ibuprofen. These medicines can thin your blood. Do not take these medicines before your procedure if your health care provider instructs you not to.  You may be given antibiotic medicine to help prevent infection. General instructions   You may have a urine sample taken to check for infection.  Plan to have someone take you home from the hospital or clinic. What happens during the procedure?  To reduce your risk of infection:  Your health care team will wash or sanitize their hands.  Your skin will be washed with soap.  An IV tube will be inserted into one of your veins.  You will be given one of the following:  A medicine to help you relax (sedative).  A medicine to make you fall asleep (general anesthetic).  A medicine that is injected into your spine to numb the area below  and slightly above the injection site (spinal anesthetic).  To lower your risk of infection, you may be given an antibiotic medicine by an injection or through the IV tube.  The opening from which you urinate (urethra) will be cleaned with a germ-killing solution.  The ureteroscope will be passed through your urethra into your bladder.  A salt-water solution will flow through the ureteroscope to fill your bladder. This will help the health care provider see the openings of your ureters more clearly.  Then, the ureteroscope will be passed into your ureter.  If a growth is found, a piece of it may be removed so it can be examined under a microscope (biopsy).  If a stone is  found, it may be removed through the ureteroscope, or the stone may be broken up using a laser, shock waves, or electrical energy.  In some cases, if the ureter is too small, a tube may be inserted that keeps the ureter open (ureteral stent). The stent may be left in place for 1 or 2 weeks to keep the ureter open, and then the ureteroscopy procedure will be performed.  The scope will be removed, and your bladder will be emptied. The procedure may vary among health care providers and hospitals. What happens after the procedure?  Your blood pressure, heart rate, breathing rate, and blood oxygen level will be monitored until the medicines you were given have worn off.  You may be asked to urinate.  Donot drive for 24 hours if you were given a sedative. This information is not intended to replace advice given to you by your health care provider. Make sure you discuss any questions you have with your health care provider. Document Released: 04/07/2013 Document Revised: 01/17/2016 Document Reviewed: 01/13/2016 Elsevier Interactive Patient Education  2017 [employer]  Ureteroscopy, Care After This sheet gives you information about how to care for yourself after your procedure. Your health care provider may also give you more specific instructions. If you have problems or questions, contact your health care provider. What can I expect after the procedure? After the procedure, it is common to have:  A burning sensation when you urinate.  Blood in your urine.  Mild discomfort in the bladder area or kidney area when urinating.  Needing to urinate more often or urgently. Follow these instructions at home: Medicines   Take over-the-counter and prescription medicines only as told by your health care provider.  If you were prescribed an antibiotic medicine, take it as told by your health care provider. Do not stop taking the antibiotic even if you start to feel better. General instructions     Donot drive for 24 hours if you were given a medicine to help you relax (sedative) during your procedure.  To relieve burning, try taking a warm bath or holding a warm washcloth over your groin.  Drink enough fluid to keep your urine clear or pale yellow.  Drink two 8-ounce glasses of water every hour for the first 2 hours after you get home.  Continue to drink water often at home.  You can eat what you usually do.  Keep all follow-up visits as told by your health care provider. This is important.  If you had a tube placed to keep urine flowing (ureteral stent), ask your health care provider when you need to return to have it removed. Contact a health care provider if:  You have chills or a fever.  You have burning pain for longer than 24 hours  after the procedure.  You have blood in your urine for longer than 24 hours after the procedure. Get help right away if:  You have large amounts of blood in your urine.  You have blood clots in your urine.  You have very bad pain.  You have chest pain or trouble breathing.  You are unable to urinate and you have the feeling of a full bladder. This information is not intended to replace advice given to you by your health care provider. Make sure you discuss any questions you have with your health care provider. Document Released: 04/07/2013 Document Revised: 01/17/2016 Document Reviewed: 01/13/2016 Elsevier Interactive Patient Education  2017 [employer] PATIENT INSTRUCTIONS POST-ANESTHESIA  IMMEDIATELY FOLLOWING SURGERY:  Do not drive or operate machinery for the first twenty four hours after surgery.  Do not make any important decisions for twenty four hours after surgery or while taking narcotic pain medications or sedatives.  If you develop intractable nausea and vomiting or a severe headache please notify your doctor immediately.  FOLLOW-UP:  Please make an appointment with your surgeon as instructed. You do not need to  follow up with anesthesia unless specifically instructed to do so.  WOUND CARE INSTRUCTIONS (if applicable):  Keep a dry clean dressing on the anesthesia/puncture wound site if there is drainage.  Once the wound has quit draining you may leave it open to air.  Generally you should leave the bandage intact for twenty four hours unless there is drainage.  If the epidural site drains for more than 36-48 hours please call the anesthesia department.  QUESTIONS?:  Please feel free to call your physician or the hospital operator if you have any questions, and they will be happy to assist you.

## 2016-08-15 ENCOUNTER — Encounter (HOSPITAL_COMMUNITY)
Admission: RE | Admit: 2016-08-15 | Discharge: 2016-08-15 | Disposition: A | Discharge status: Home / Self Care | Payer: Medicare Other | Source: Ambulatory Visit | Attending: Urology | Admitting: Urology

## 2016-08-15 ENCOUNTER — Encounter (HOSPITAL_COMMUNITY): Payer: Self-pay

## 2016-08-15 DIAGNOSIS — R339 Retention of urine, unspecified: Secondary | ICD-10-CM | POA: Diagnosis not present

## 2016-08-15 DIAGNOSIS — N179 Acute kidney failure, unspecified: Secondary | ICD-10-CM | POA: Diagnosis not present

## 2016-08-15 DIAGNOSIS — N132 Hydronephrosis with renal and ureteral calculous obstruction: Secondary | ICD-10-CM | POA: Diagnosis not present

## 2016-08-15 DIAGNOSIS — Z01812 Encounter for preprocedural laboratory examination: Secondary | ICD-10-CM | POA: Diagnosis not present

## 2016-08-15 DIAGNOSIS — R509 Fever, unspecified: Secondary | ICD-10-CM | POA: Diagnosis not present

## 2016-08-15 DIAGNOSIS — N390 Urinary tract infection, site not specified: Secondary | ICD-10-CM | POA: Diagnosis not present

## 2016-08-15 DIAGNOSIS — E785 Hyperlipidemia, unspecified: Secondary | ICD-10-CM | POA: Diagnosis not present

## 2016-08-15 DIAGNOSIS — R6510 Systemic inflammatory response syndrome (SIRS) of non-infectious origin without acute organ dysfunction: Secondary | ICD-10-CM | POA: Insufficient documentation

## 2016-08-15 DIAGNOSIS — R319 Hematuria, unspecified: Secondary | ICD-10-CM | POA: Diagnosis not present

## 2016-08-15 DIAGNOSIS — E119 Type 2 diabetes mellitus without complications: Secondary | ICD-10-CM | POA: Insufficient documentation

## 2016-08-15 DIAGNOSIS — I10 Essential (primary) hypertension: Secondary | ICD-10-CM | POA: Diagnosis not present

## 2016-08-15 DIAGNOSIS — A419 Sepsis, unspecified organism: Secondary | ICD-10-CM | POA: Insufficient documentation

## 2016-08-15 DIAGNOSIS — N400 Enlarged prostate without lower urinary tract symptoms: Secondary | ICD-10-CM | POA: Insufficient documentation

## 2016-08-15 DIAGNOSIS — F79 Unspecified intellectual disabilities: Secondary | ICD-10-CM | POA: Insufficient documentation

## 2016-08-15 DIAGNOSIS — E871 Hypo-osmolality and hyponatremia: Secondary | ICD-10-CM | POA: Insufficient documentation

## 2016-08-15 DIAGNOSIS — E876 Hypokalemia: Secondary | ICD-10-CM | POA: Diagnosis not present

## 2016-08-15 DIAGNOSIS — R531 Weakness: Secondary | ICD-10-CM | POA: Insufficient documentation

## 2016-08-15 DIAGNOSIS — K598 Other specified functional intestinal disorders: Secondary | ICD-10-CM | POA: Diagnosis not present

## 2016-08-15 HISTORY — DX: Unspecified intellectual disabilities: F79

## 2016-08-15 HISTORY — DX: Personal history of urinary calculi: Z87.442

## 2016-08-15 HISTORY — DX: Personal history of other diseases of the musculoskeletal system and connective tissue: Z87.39

## 2016-08-15 LAB — CBC WITH DIFFERENTIAL/PLATELET
BASOS PCT: 0 %
Basophils Absolute: 0 10*3/uL (ref 0.0–0.1)
Eosinophils Absolute: 0.1 10*3/uL (ref 0.0–0.7)
Eosinophils Relative: 1 %
HEMATOCRIT: 33.2 % — AB (ref 39.0–52.0)
HEMOGLOBIN: 10.5 g/dL — AB (ref 13.0–17.0)
LYMPHS ABS: 1.2 10*3/uL (ref 0.7–4.0)
Lymphocytes Relative: 10 %
MCH: 28.8 pg (ref 26.0–34.0)
MCHC: 31.6 g/dL (ref 30.0–36.0)
MCV: 91 fL (ref 78.0–100.0)
MONOS PCT: 5 %
Monocytes Absolute: 0.6 10*3/uL (ref 0.1–1.0)
NEUTROS ABS: 9.2 10*3/uL — AB (ref 1.7–7.7)
NEUTROS PCT: 84 %
Platelets: 304 10*3/uL (ref 150–400)
RBC: 3.65 MIL/uL — AB (ref 4.22–5.81)
RDW: 14.4 % (ref 11.5–15.5)
WBC: 11 10*3/uL — AB (ref 4.0–10.5)

## 2016-08-15 LAB — BASIC METABOLIC PANEL
Anion gap: 11 (ref 5–15)
BUN: 9 mg/dL (ref 6–20)
CHLORIDE: 90 mmol/L — AB (ref 101–111)
CO2: 33 mmol/L — AB (ref 22–32)
CREATININE: 1.06 mg/dL (ref 0.61–1.24)
Calcium: 8.8 mg/dL — ABNORMAL LOW (ref 8.9–10.3)
Glucose, Bld: 285 mg/dL — ABNORMAL HIGH (ref 65–99)
POTASSIUM: 3 mmol/L — AB (ref 3.5–5.1)
SODIUM: 134 mmol/L — AB (ref 135–145)

## 2016-08-15 NOTE — Progress Notes (Signed)
   08/15/16 0905  OBSTRUCTIVE SLEEP APNEA  Have you ever been diagnosed with sleep apnea through a sleep study? No  Do you snore loudly (loud enough to be heard through closed doors)?  1  Do you often feel tired, fatigued, or sleepy during the daytime (such as falling asleep during driving or talking to someone)? 0  Has anyone observed you stop breathing during your sleep? 1  Do you have, or are you being treated for high blood pressure? 0  BMI more than 35 kg/m2? 0  Age > 50 (1-yes) 1  Neck circumference greater than:Male 16 inches or larger, Male 17inches or larger? 0  Male Gender (Yes=1) 1  Obstructive Sleep Apnea Score 4  Score 5 or greater  Results sent to PCP

## 2016-08-16 NOTE — Pre-Procedure Instructions (Signed)
Potassium of 3.0 shown to Dr Gonzalez. No orders given. 

## 2016-08-20 ENCOUNTER — Encounter (HOSPITAL_COMMUNITY)
Admission: RE | Disposition: A | Discharge status: Home / Self Care | Payer: Self-pay | Source: Ambulatory Visit | Attending: Urology

## 2016-08-20 ENCOUNTER — Ambulatory Visit (HOSPITAL_COMMUNITY): Payer: Medicare Other | Admitting: Anesthesiology

## 2016-08-20 ENCOUNTER — Ambulatory Visit (HOSPITAL_COMMUNITY): Payer: Medicare Other

## 2016-08-20 ENCOUNTER — Ambulatory Visit (HOSPITAL_COMMUNITY)
Admission: RE | Admit: 2016-08-20 | Discharge: 2016-08-20 | Disposition: A | Discharge status: Home / Self Care | Payer: Medicare Other | Source: Ambulatory Visit | Attending: Urology | Admitting: Urology

## 2016-08-20 ENCOUNTER — Encounter (HOSPITAL_COMMUNITY): Payer: Self-pay | Admitting: *Deleted

## 2016-08-20 DIAGNOSIS — R339 Retention of urine, unspecified: Secondary | ICD-10-CM | POA: Insufficient documentation

## 2016-08-20 DIAGNOSIS — F79 Unspecified intellectual disabilities: Secondary | ICD-10-CM | POA: Insufficient documentation

## 2016-08-20 DIAGNOSIS — Z7982 Long term (current) use of aspirin: Secondary | ICD-10-CM | POA: Insufficient documentation

## 2016-08-20 DIAGNOSIS — Z7984 Long term (current) use of oral hypoglycemic drugs: Secondary | ICD-10-CM | POA: Insufficient documentation

## 2016-08-20 DIAGNOSIS — Z79899 Other long term (current) drug therapy: Secondary | ICD-10-CM | POA: Insufficient documentation

## 2016-08-20 DIAGNOSIS — N132 Hydronephrosis with renal and ureteral calculous obstruction: Secondary | ICD-10-CM | POA: Insufficient documentation

## 2016-08-20 DIAGNOSIS — Z87442 Personal history of urinary calculi: Secondary | ICD-10-CM | POA: Diagnosis not present

## 2016-08-20 DIAGNOSIS — I10 Essential (primary) hypertension: Secondary | ICD-10-CM | POA: Diagnosis not present

## 2016-08-20 DIAGNOSIS — N201 Calculus of ureter: Secondary | ICD-10-CM

## 2016-08-20 DIAGNOSIS — M109 Gout, unspecified: Secondary | ICD-10-CM | POA: Diagnosis not present

## 2016-08-20 DIAGNOSIS — E119 Type 2 diabetes mellitus without complications: Secondary | ICD-10-CM | POA: Diagnosis not present

## 2016-08-20 HISTORY — PX: HOLMIUM LASER APPLICATION: SHX5852

## 2016-08-20 HISTORY — PX: CYSTOSCOPY WITH RETROGRADE PYELOGRAM, URETEROSCOPY AND STENT PLACEMENT: SHX5789

## 2016-08-20 LAB — POCT I-STAT 4, (NA,K, GLUC, HGB,HCT)
GLUCOSE: 168 mg/dL — AB (ref 65–99)
HCT: 32 % — ABNORMAL LOW (ref 39.0–52.0)
Hemoglobin: 10.9 g/dL — ABNORMAL LOW (ref 13.0–17.0)
Potassium: 3.5 mmol/L (ref 3.5–5.1)
Sodium: 138 mmol/L (ref 135–145)

## 2016-08-20 LAB — GLUCOSE, CAPILLARY: GLUCOSE-CAPILLARY: 166 mg/dL — AB (ref 65–99)

## 2016-08-20 SURGERY — CYSTOURETEROSCOPY, WITH RETROGRADE PYELOGRAM AND STENT INSERTION
Anesthesia: General | Laterality: Bilateral

## 2016-08-20 MED ORDER — ROCURONIUM BROMIDE 50 MG/5ML IV SOLN
INTRAVENOUS | Status: AC
  Filled 2016-08-20: qty 1

## 2016-08-20 MED ORDER — SUCCINYLCHOLINE CHLORIDE 20 MG/ML IJ SOLN
INTRAMUSCULAR | Status: AC
  Filled 2016-08-20: qty 1

## 2016-08-20 MED ORDER — FENTANYL CITRATE (PF) 100 MCG/2ML IJ SOLN
INTRAMUSCULAR | Status: DC | PRN
  Administered 2016-08-20: 50 ug via INTRAVENOUS

## 2016-08-20 MED ORDER — SUCCINYLCHOLINE CHLORIDE 20 MG/ML IJ SOLN
INTRAMUSCULAR | Status: DC | PRN
  Administered 2016-08-20: 130 mg via INTRAVENOUS

## 2016-08-20 MED ORDER — SEVOFLURANE IN SOLN
RESPIRATORY_TRACT | Status: AC
  Filled 2016-08-20: qty 250

## 2016-08-20 MED ORDER — FENTANYL CITRATE (PF) 100 MCG/2ML IJ SOLN: 25.0000 ug | INTRAMUSCULAR | Status: DC | PRN

## 2016-08-20 MED ORDER — DEXTROSE 5 % IV SOLN
2.0000 g | INTRAVENOUS | Status: AC
  Administered 2016-08-20: 2 g via INTRAVENOUS

## 2016-08-20 MED ORDER — LIDOCAINE HCL (PF) 1 % IJ SOLN
INTRAMUSCULAR | Status: AC
  Filled 2016-08-20: qty 5

## 2016-08-20 MED ORDER — MIDAZOLAM HCL 2 MG/2ML IJ SOLN
INTRAMUSCULAR | Status: AC
  Filled 2016-08-20: qty 2

## 2016-08-20 MED ORDER — DIATRIZOATE MEGLUMINE 30 % UR SOLN
URETHRAL | Status: DC | PRN
  Administered 2016-08-20 (×2): 10 mL

## 2016-08-20 MED ORDER — STERILE WATER FOR IRRIGATION IR SOLN
Status: DC | PRN
  Administered 2016-08-20: 1000 mL

## 2016-08-20 MED ORDER — MIDAZOLAM HCL 2 MG/2ML IJ SOLN
1.0000 mg | INTRAMUSCULAR | Status: AC
  Administered 2016-08-20: 2 mg via INTRAVENOUS

## 2016-08-20 MED ORDER — ROCURONIUM BROMIDE 100 MG/10ML IV SOLN
INTRAVENOUS | Status: DC | PRN
  Administered 2016-08-20: 5 mg via INTRAVENOUS

## 2016-08-20 MED ORDER — LIDOCAINE HCL 1 % IJ SOLN
INTRAMUSCULAR | Status: DC | PRN
  Administered 2016-08-20: 25 mg via INTRADERMAL

## 2016-08-20 MED ORDER — DEXTROSE 5 % IV SOLN
INTRAVENOUS | Status: AC
  Filled 2016-08-20: qty 2

## 2016-08-20 MED ORDER — EPHEDRINE SULFATE 50 MG/ML IJ SOLN
INTRAMUSCULAR | Status: DC | PRN
  Administered 2016-08-20: 10 mg via INTRAVENOUS

## 2016-08-20 MED ORDER — SODIUM CHLORIDE 0.9 % IR SOLN
Status: DC | PRN
  Administered 2016-08-20 (×2): 3000 mL

## 2016-08-20 MED ORDER — MIDAZOLAM HCL 5 MG/5ML IJ SOLN
INTRAMUSCULAR | Status: DC | PRN
  Administered 2016-08-20: 2 mg via INTRAVENOUS

## 2016-08-20 MED ORDER — DIATRIZOATE MEGLUMINE 30 % UR SOLN
URETHRAL | Status: AC
  Filled 2016-08-20: qty 300

## 2016-08-20 MED ORDER — LACTATED RINGERS IV SOLN
INTRAVENOUS | Status: DC
  Administered 2016-08-20 (×2): via INTRAVENOUS

## 2016-08-20 MED ORDER — HYDROCODONE-ACETAMINOPHEN 5-325 MG PO TABS: 1.0000 | ORAL_TABLET | Freq: Four times a day (QID) | ORAL | 0 refills | Status: DC | PRN

## 2016-08-20 MED ORDER — PROPOFOL 10 MG/ML IV BOLUS
INTRAVENOUS | Status: DC | PRN
  Administered 2016-08-20: 120 mg via INTRAVENOUS

## 2016-08-20 SURGICAL SUPPLY — 26 items
BAG DRAIN CYSTO-URO STER (DRAIN) ×3 IMPLANT
BAG HAMPER (MISCELLANEOUS) ×3 IMPLANT
CATH INTERMIT  6FR 70CM (CATHETERS) ×3 IMPLANT
CLOTH BEACON ORANGE TIMEOUT ST (SAFETY) ×3 IMPLANT
DECANTER SPIKE VIAL GLASS SM (MISCELLANEOUS) ×3 IMPLANT
EXTRACTOR STONE NITINOL NGAGE (UROLOGICAL SUPPLIES) ×3 IMPLANT
FIBER LASER FLEXIVA 200 (UROLOGICAL SUPPLIES) ×3 IMPLANT
GLOVE BIO SURGEON STRL SZ8 (GLOVE) ×3 IMPLANT
GLOVE BIOGEL PI IND STRL 6.5 (GLOVE) ×1 IMPLANT
GLOVE BIOGEL PI IND STRL 7.0 (GLOVE) ×1 IMPLANT
GLOVE BIOGEL PI INDICATOR 6.5 (GLOVE) ×2
GLOVE BIOGEL PI INDICATOR 7.0 (GLOVE) ×2
GLOVE SS N UNI LF 6.5 STRL (GLOVE) ×3 IMPLANT
GOWN STRL REUS W/ TWL XL LVL3 (GOWN DISPOSABLE) ×1 IMPLANT
GOWN STRL REUS W/TWL LRG LVL3 (GOWN DISPOSABLE) ×3 IMPLANT
GOWN STRL REUS W/TWL XL LVL3 (GOWN DISPOSABLE) ×2
GUIDEWIRE STR DUAL SENSOR (WIRE) ×3 IMPLANT
GUIDEWIRE STR ZIPWIRE 035X150 (MISCELLANEOUS) ×3 IMPLANT
IV NS IRRIG 3000ML ARTHROMATIC (IV SOLUTION) ×6 IMPLANT
MANIFOLD NEPTUNE II (INSTRUMENTS) ×3 IMPLANT
PACK CYSTO (CUSTOM PROCEDURE TRAY) ×3 IMPLANT
PAD ARMBOARD 7.5X6 YLW CONV (MISCELLANEOUS) ×3 IMPLANT
SHEATH ACCESS URETERAL 38CM (SHEATH) ×3 IMPLANT
STENT URET 6FRX26 CONTOUR (STENTS) ×6 IMPLANT
SYRINGE 10CC LL (SYRINGE) ×3 IMPLANT
TOWEL OR 17X26 4PK STRL BLUE (TOWEL DISPOSABLE) ×3 IMPLANT

## 2016-08-20 NOTE — Anesthesia Procedure Notes (Signed)
Procedure Name: Intubation Date/Time: 08/20/2016 10:24 AM Performed by: Charmaine Downs Pre-anesthesia Checklist: Patient identified, Patient being monitored, Timeout performed, Emergency Drugs available and Suction available Patient Re-evaluated:Patient Re-evaluated prior to inductionOxygen Delivery Method: Circle System Utilized Preoxygenation: Pre-oxygenation with 100% oxygen Intubation Type: IV induction, Cricoid Pressure applied and Rapid sequence Ventilation: Mask ventilation without difficulty Laryngoscope Size: Mac and 3 Grade View: Grade I Tube type: Oral Tube size: 8.0 mm Number of attempts: 1 Airway Equipment and Method: stylet Placement Confirmation: ETT inserted through vocal cords under direct vision,  positive ETCO2 and breath sounds checked- equal and bilateral Secured at: 22 cm Tube secured with: Tape Dental Injury: Teeth and Oropharynx as per pre-operative assessment

## 2016-08-20 NOTE — Discharge Instructions (Signed)
Ureteral Stent Implantation, Care After °Refer to this sheet in the next few weeks. These instructions provide you with information about caring for yourself after your procedure. Your health care provider may also give you more specific instructions. Your treatment has been planned according to current medical practices, but problems sometimes occur. Call your health care provider if you have any problems or questions after your procedure. °What can I expect after the procedure? °After the procedure, it is common to have: °· Nausea. °· Mild pain when you urinate. You may feel this pain in your lower back or lower abdomen. Pain should stop within a few minutes after you urinate. This may last for up to 1 week. °· A small amount of blood in your urine for several days. °Follow these instructions at home: °  °Medicines  °· Take over-the-counter and prescription medicines only as told by your health care provider. °· If you were prescribed an antibiotic medicine, take it as told by your health care provider. Do not stop taking the antibiotic even if you start to feel better. °· Do not drive for 24 hours if you received a sedative. °· Do not drive or operate heavy machinery while taking prescription pain medicines. °Activity  °· Return to your normal activities as told by your health care provider. Ask your health care provider what activities are safe for you. °· Do not lift anything that is heavier than 10 lb (4.5 kg). Follow this limit for 1 week after your procedure, or for as long as told by your health care provider. °General instructions  °· Watch for any blood in your urine. Call your health care provider if the amount of blood in your urine increases. °· If you have a catheter: °¨ Follow instructions from your health care provider about taking care of your catheter and collection bag. °¨ Do not take baths, swim, or use a hot tub until your health care provider approves. °· Drink enough fluid to keep your urine  clear or pale yellow. °· Keep all follow-up visits as told by your health care provider. This is important. °Contact a health care provider if: °· You have pain that gets worse or does not get better with medicine, especially pain when you urinate. °· You have difficulty urinating. °· You feel nauseous or you vomit repeatedly during a period of more than 2 days after the procedure. °Get help right away if: °· Your urine is dark red or has blood clots in it. °· You are leaking urine (have incontinence). °· The end of the stent comes out of your urethra. °· You cannot urinate. °· You have sudden, sharp, or severe pain in your abdomen or lower back. °· You have a fever. °This information is not intended to replace advice given to you by your health care provider. Make sure you discuss any questions you have with your health care provider. °Document Released: 12/03/2012 Document Revised: 09/08/2015 Document Reviewed: 10/15/2014 °Elsevier Interactive Patient Education © 2017 [employer] ° ° °PATIENT INSTRUCTIONS °POST-ANESTHESIA ° °IMMEDIATELY FOLLOWING SURGERY:  Do not drive or operate machinery for the first twenty four hours after surgery.  Do not make any important decisions for twenty four hours after surgery or while taking narcotic pain medications or sedatives.  If you develop intractable nausea and vomiting or a severe headache please notify your doctor immediately. ° °FOLLOW-UP:  Please make an appointment with your surgeon as instructed. You do not need to follow up with anesthesia unless specifically   instructed to do so. ° °WOUND CARE INSTRUCTIONS (if applicable):  Keep a dry clean dressing on the anesthesia/puncture wound site if there is drainage.  Once the wound has quit draining you may leave it open to air.  Generally you should leave the bandage intact for twenty four hours unless there is drainage.  If the epidural site drains for more than 36-48 hours please call the anesthesia  department. ° °QUESTIONS?:  Please feel free to call your physician or the hospital operator if you have any questions, and they will be happy to assist you.    ° ° ° °

## 2016-08-20 NOTE — Transfer of Care (Signed)
Immediate Anesthesia Transfer of Care Note  Patient: [name]  Procedure(s) Performed: Procedure(s): CYSTOSCOPY WITH BILATERAL RETROGRADE PYELOGRAM, BILATERAL URETEROSCOPY,  LASER LITHOTRIPSY OF BILATERAL URETERAL CALCULI  AND BILATERAL URETERAL STENT EXCHANGE (Bilateral) HOLMIUM LASER APPLICATION (Bilateral)  Patient Location: PACU  Anesthesia Type:General  Level of Consciousness: awake and patient cooperative  Airway & Oxygen Therapy: Patient Spontanous Breathing and Patient connected to face mask oxygen  Post-op Assessment: Report given to RN and Post -op Vital signs reviewed and stable  Post vital signs: Reviewed and stable  Last Vitals:  Vitals:   08/20/16 1005 08/20/16 1010  BP: (!) 102/57 (!) 84/37  Resp: 14 (!) 23  Temp:      Last Pain:  Vitals:   08/20/16 0807  TempSrc: Oral         Complications: No apparent anesthesia complications

## 2016-08-20 NOTE — H&P (Signed)
Urology Admission H&P  Chief Complaint: bilateral ureteral calculi  History of Present Illness: [name] is a 66yo with a hx of bilateral ureteral calculi s/p bilateral stent placement.  Past Medical History:  Diagnosis Date  . Diabetes mellitus without complication (HCC)   . History of gout   . History of kidney stones   . Hypertension   . Mentally challenged   . Urinary retention    Past Surgical History:  Procedure Laterality Date  . CYSTOSCOPY W/ URETERAL STENT PLACEMENT Bilateral 06/20/2016   Procedure: CYSTOSCOPY WITH RETROGRADE PYELOGRAM/URETERAL STENT PLACEMENT;  Surgeon: Malen GauzePatrick L Kenzlie Disch, MD;  Location: [facility];  Service: Urology;  Laterality: Bilateral;    Home Medications:  Prescriptions Prior to Admission  Medication Sig Dispense Refill Last Dose  . aspirin EC 81 MG tablet Take 81 mg by mouth every morning.    Past Month at Unknown time  . atorvastatin (LIPITOR) 20 MG tablet Take 20 mg by mouth every evening.    07/03/2016 at Unknown time  . metFORMIN (GLUCOPHAGE-XR) 500 MG 24 hr tablet Take 2 tablets by mouth every morning.   2 08/19/2016 at Unknown time  . tamsulosin (FLOMAX) 0.4 MG CAPS capsule Take 0.4 mg by mouth daily.   08/19/2016 at Unknown time  . ciprofloxacin (CIPRO) 500 MG tablet Take 1 tablet (500 mg total) by mouth 2 (two) times daily. (Patient not taking: Reported on 08/15/2016) 20 tablet 0 Completed Course at Unknown time  . pantoprazole (PROTONIX) 40 MG tablet Take 1 tablet (40 mg total) by mouth 2 (two) times daily before a meal. 60 tablet 2 07/04/2016 at Unknown time  . potassium chloride SA (K-DUR,KLOR-CON) 20 MEQ tablet Take 1 tablet (20 mEq total) by mouth daily. 10 tablet 0    Allergies: Not on File  History reviewed. No pertinent family history. Social History:  reports that he has never smoked. He has never used smokeless tobacco. He reports that he does not drink alcohol or use drugs.  Review of Systems  All other systems reviewed and are  negative.   Physical Exam:  Vital signs in last 24 hours: Temp:  [98 F (36.7 C)] 98 F (36.7 C) (05/07 0807) BP: (103)/(55) 103/55 (05/07 0807) SpO2:  [99 %] 99 % (05/07 0807) Physical Exam  Constitutional: He is oriented to person, place, and time. He appears well-developed and well-nourished.  HENT:  Head: Normocephalic and atraumatic.  Eyes: EOM are normal. Pupils are equal, round, and reactive to light.  Neck: Normal range of motion. No thyromegaly present.  Cardiovascular: Normal rate and regular rhythm.   Respiratory: Effort normal. No respiratory distress.  GI: Soft. He exhibits no distension.  Musculoskeletal: Normal range of motion. He exhibits no edema.  Neurological: He is alert and oriented to person, place, and time.  Skin: Skin is warm and dry.  Psychiatric: He has a normal mood and affect. His behavior is normal. Judgment and thought content normal.    Laboratory Data:  Results for orders placed or performed during the hospital encounter of 08/20/16 (from the past 24 hour(s))  I-STAT 4, (NA,K, GLUC, HGB,HCT)     Status: Abnormal   Collection Time: 08/20/16  8:09 AM  Result Value Ref Range   Sodium 138 135 - 145 mmol/L   Potassium 3.5 3.5 - 5.1 mmol/L   Glucose, Bld 168 (H) 65 - 99 mg/dL   HCT 09.832.0 (L) 11.939.0 - 14.752.0 %   Hemoglobin 10.9 (L) 13.0 - 17.0 g/dL   No  results found for this or any previous visit (from the past 240 hour(s)). Creatinine:  Recent Labs  08/15/16 0926  CREATININE 1.06   Baseline Creatinine: 1  Impression/Assessment:  65yo with bilateral ureteral calculi  Plan:  The risks/benefits/alternatives to bilateral URS was explained to the patient and he understands and wishes to proceed with surgery  Wilkie Aye 08/20/2016, 10:05 AM

## 2016-08-20 NOTE — Anesthesia Preprocedure Evaluation (Addendum)
Anesthesia Evaluation  Patient identified by MRN, date of birth, ID band Patient confused    Reviewed: Allergy & Precautions, NPO status , Patient's Chart, lab work & pertinent test results  Airway Mallampati: III  TM Distance: >3 FB     Dental  (+) Edentulous Upper, Edentulous Lower   Pulmonary neg pulmonary ROS, pneumonia, resolved,  CXR: ATX vs early pneumonia   breath sounds clear to auscultation       Cardiovascular hypertension, Pt. on medications  Rhythm:Regular Rate:Normal     Neuro/Psych PSYCHIATRIC DISORDERS (mental retardation , pt is non-communicative)    GI/Hepatic negative GI ROS,   Endo/Other  diabetes, Type 2  Renal/GU Renal disease     Musculoskeletal   Abdominal   Peds  Hematology   Anesthesia Other Findings   Reproductive/Obstetrics                             Anesthesia Physical Anesthesia Plan  ASA: III  Anesthesia Plan: General   Post-op Pain Management:    Induction: Intravenous, Rapid sequence and Cricoid pressure planned  Airway Management Planned: Oral ETT  Additional Equipment:   Intra-op Plan:   Post-operative Plan: Extubation in OR  Informed Consent: I have reviewed the patients History and Physical, chart, labs and discussed the procedure including the risks, benefits and alternatives for the proposed anesthesia with the patient or authorized representative who has indicated his/her understanding and acceptance.     Plan Discussed with:   Anesthesia Plan Comments:        Anesthesia Quick Evaluation

## 2016-08-20 NOTE — Op Note (Signed)
.  Preoperative diagnosis: bilateral ureteral stone  Postoperative diagnosis: Same  Procedure: 1 cystoscopy 2. bilateralretrograde pyelography 3.  Intraoperative fluoroscopy, under one hour, with interpretation 4.  Bilateral ureteroscopic stone manipulation with laser lithotripsy 5.  bilateral 6 x 26 JJ stent exchange  Attending: Cleda MccreedyPatrick Mackenzie  Anesthesia: General  Estimated blood loss: None  Drains: bilateral 6 x 26 JJ ureteral stent without tether  Specimens: stone for analysis  Antibiotics: rocephin  Findings: bilateral impacted mid and distal ureteral stones. severe bilateral hydronephrosis. No masses/lesions in the bladder. Ureteral orifices in normal anatomic location. Severe trabeculations  Indications: Patient is a 66 year old male with a history of bilateral distal ureteral stone and elevated creatinine who previously underwent bilateral stent placement. After discussing treatment options, they decided proceed with bilateral ureteroscopic stone manipulation.  Procedure her in detail: The patient was brought to the operating room and a brief timeout was done to ensure correct patient, correct procedure, correct site.  General anesthesia was administered patient was placed in dorsal lithotomy position.  Her genitalia was then prepped and draped in usual sterile fashion.  A rigid 22 French cystoscope was passed in the urethra and the bladder.  Bladder was inspected free masses or lesions.  the ureteral orifices were in the normal orthotopic locations. Using a grasper the ureteral stent was brought to the urethral meatus. Through the stent a zipwire was advanced up to the renal pelvis. The stent was then removed. a 6 french ureteral catheter was then instilled into the left ureteral orifice.  a gentle retrograde was obtained and findings noted above.  we then removed the cystoscope and cannulated the left ureteral orifice with a semirigid ureteroscope.  We located multiple stones in  the distal ureter and removed it with an Human resources officergage basket. We then used a flexible ureteroscope to perform nephroscopy. We noted no stone in the left kidney. We then placed a 6 x 26 double-j ureteral stent over the original zip wire. We then removed the wire and good coil was noted in the the renal pelvis under fluoroscopy and the bladder under direct vision.  We then turned out attention to the right side. Using a grasper the ureteral stent was brought to the urethral meatus. Through the stent a zipwire was advanced up to the renal pelvis. The stent was then removed. a 6 french ureteral catheter was then instilled into the right ureteral orifice.  a gentle retrograde was obtained and findings noted above.  we then removed the cystoscope and cannulated the left ureteral orifice with a semirigid ureteroscope.  We located a large stone in the distal ureter. Using a 200 nm laser fiber and fragmented the stone into smaller pieces.  the pieces were then removed with a engage basket.   once all stone fragments were removed we then placed a 6 x 26 double-j ureteral stent over the original zip wire.  We then removed the wire and good coil was noted in the the renal pelvis under fluoroscopy and the bladder under direct vision. the stone fragments were then removed from the bladder and sent for analysis.   the bladder was then drained and this concluded the procedure which was well tolerated by patient.  Complications: None  Condition: Stable, extubated, transferred to PACU  Plan: Patient is to be discharged home as to follow-up in 1 week for stent removal.

## 2016-08-20 NOTE — Anesthesia Postprocedure Evaluation (Signed)
Anesthesia Post Note  Patient: [name]  Procedure(s) Performed: Procedure(s) (LRB): CYSTOSCOPY WITH BILATERAL RETROGRADE PYELOGRAM, BILATERAL URETEROSCOPY,  LASER LITHOTRIPSY OF RIGHT URETERAL CALCULI, STONE BASKET EXTRACTION LEFT URETERAL CALCULI  AND BILATERAL URETERAL STENT EXCHANGE (Bilateral) HOLMIUM LASER APPLICATION (Bilateral)  Patient location during evaluation: PACU Anesthesia Type: General Level of consciousness: awake Pain management: pain level controlled Vital Signs Assessment: post-procedure vital signs reviewed and stable Respiratory status: spontaneous breathing, nonlabored ventilation and respiratory function stable Cardiovascular status: blood pressure returned to baseline Postop Assessment: no signs of nausea or vomiting Anesthetic complications: no     Last Vitals:  Vitals:   08/20/16 1005 08/20/16 1010  BP: (!) 102/57 (!) 84/37  Resp: 14 (!) 23  Temp:      Last Pain:  Vitals:   08/20/16 0807  TempSrc: Oral                 [name] J

## 2016-08-21 ENCOUNTER — Encounter (HOSPITAL_COMMUNITY): Payer: Self-pay | Admitting: Urology

## 2016-08-24 ENCOUNTER — Ambulatory Visit (INDEPENDENT_AMBULATORY_CARE_PROVIDER_SITE_OTHER): Payer: Medicare Other | Admitting: Urology

## 2016-08-24 ENCOUNTER — Other Ambulatory Visit: Payer: Self-pay | Admitting: Urology

## 2016-08-24 DIAGNOSIS — N200 Calculus of kidney: Secondary | ICD-10-CM

## 2016-08-27 LAB — STONE ANALYSIS
AMMONIUM ACID URATE: 10 %
CA OXALATE, MONOHYDR.: 58 %
Ca Oxalate,Dihydrate: 2 %
Ca phos cry stone ql IR: 25 %
Magnesium Ammon Phos: 5 %
Stone Weight KSTONE: 303.4 mg

## 2016-09-14 ENCOUNTER — Ambulatory Visit (HOSPITAL_COMMUNITY)
Admission: RE | Admit: 2016-09-14 | Discharge: 2016-09-14 | Disposition: A | Discharge status: Home / Self Care | Payer: Medicare Other | Source: Ambulatory Visit | Attending: Urology | Admitting: Urology

## 2016-09-14 DIAGNOSIS — N323 Diverticulum of bladder: Secondary | ICD-10-CM | POA: Diagnosis not present

## 2016-09-14 DIAGNOSIS — N200 Calculus of kidney: Secondary | ICD-10-CM | POA: Diagnosis not present

## 2016-10-03 ENCOUNTER — Ambulatory Visit (INDEPENDENT_AMBULATORY_CARE_PROVIDER_SITE_OTHER): Payer: Medicare Other | Admitting: Urology

## 2016-10-03 DIAGNOSIS — N200 Calculus of kidney: Secondary | ICD-10-CM | POA: Diagnosis not present

## 2016-10-03 DIAGNOSIS — N401 Enlarged prostate with lower urinary tract symptoms: Secondary | ICD-10-CM

## 2016-12-19 ENCOUNTER — Other Ambulatory Visit: Payer: Self-pay | Admitting: Urology

## 2016-12-19 DIAGNOSIS — N200 Calculus of kidney: Secondary | ICD-10-CM

## 2016-12-20 ENCOUNTER — Telehealth: Payer: Self-pay | Admitting: Radiology

## 2017-01-08 ENCOUNTER — Ambulatory Visit: Payer: Medicare Other | Admitting: Urology

## 2018-02-26 ENCOUNTER — Encounter (HOSPITAL_COMMUNITY): Payer: Self-pay

## 2018-02-26 ENCOUNTER — Emergency Department (HOSPITAL_COMMUNITY)
Admission: EM | Admit: 2018-02-26 | Discharge: 2018-02-26 | Disposition: A | Discharge status: Home / Self Care | Payer: Medicare Other | Attending: Emergency Medicine | Admitting: Emergency Medicine

## 2018-02-26 ENCOUNTER — Other Ambulatory Visit: Payer: Self-pay

## 2018-02-26 DIAGNOSIS — N390 Urinary tract infection, site not specified: Secondary | ICD-10-CM | POA: Diagnosis not present

## 2018-02-26 DIAGNOSIS — R319 Hematuria, unspecified: Secondary | ICD-10-CM

## 2018-02-26 DIAGNOSIS — R3129 Other microscopic hematuria: Secondary | ICD-10-CM | POA: Insufficient documentation

## 2018-02-26 DIAGNOSIS — D649 Anemia, unspecified: Secondary | ICD-10-CM | POA: Diagnosis not present

## 2018-02-26 LAB — COMPREHENSIVE METABOLIC PANEL
ALBUMIN: 3.5 g/dL (ref 3.5–5.0)
ALK PHOS: 55 U/L (ref 38–126)
ALT: 10 U/L (ref 0–44)
AST: 13 U/L — ABNORMAL LOW (ref 15–41)
Anion gap: 6 (ref 5–15)
BUN: 14 mg/dL (ref 8–23)
CO2: 29 mmol/L (ref 22–32)
CREATININE: 0.99 mg/dL (ref 0.61–1.24)
Calcium: 9.2 mg/dL (ref 8.9–10.3)
Chloride: 102 mmol/L (ref 98–111)
GFR calc non Af Amer: >60 mL/min (ref >60)
GLUCOSE: 126 mg/dL — AB (ref 70–99)
Potassium: 3.7 mmol/L (ref 3.5–5.1)
SODIUM: 137 mmol/L (ref 135–145)
Total Bilirubin: 0.6 mg/dL (ref 0.3–1.2)
Total Protein: 7.1 g/dL (ref 6.5–8.1)

## 2018-02-26 LAB — URINALYSIS, ROUTINE W REFLEX MICROSCOPIC
Bilirubin Urine: NEGATIVE
Glucose, UA: NEGATIVE mg/dL
Ketones, ur: NEGATIVE mg/dL
NITRITE: NEGATIVE
PROTEIN: 100 mg/dL — AB
RBC / HPF: >50 RBC/hpf — ABNORMAL HIGH (ref 0–5)
SPECIFIC GRAVITY, URINE: 1.016 (ref 1.005–1.030)
WBC, UA: >50 WBC/hpf — ABNORMAL HIGH (ref 0–5)
pH: 6 (ref 5.0–8.0)

## 2018-02-26 LAB — CBC WITH DIFFERENTIAL/PLATELET
ABS IMMATURE GRANULOCYTES: 0.02 10*3/uL (ref 0.00–0.07)
BASOS ABS: 0 10*3/uL (ref 0.0–0.1)
BASOS PCT: 1 %
Eosinophils Absolute: 0.3 10*3/uL (ref 0.0–0.5)
Eosinophils Relative: 4 %
HCT: 37.3 % — ABNORMAL LOW (ref 39.0–52.0)
HEMOGLOBIN: 11.6 g/dL — AB (ref 13.0–17.0)
IMMATURE GRANULOCYTES: 0 %
LYMPHS PCT: 32 %
Lymphs Abs: 2.1 10*3/uL (ref 0.7–4.0)
MCH: 29.4 pg (ref 26.0–34.0)
MCHC: 31.1 g/dL (ref 30.0–36.0)
MCV: 94.7 fL (ref 80.0–100.0)
Monocytes Absolute: 0.5 10*3/uL (ref 0.1–1.0)
Monocytes Relative: 8 %
NEUTROS ABS: 3.7 10*3/uL (ref 1.7–7.7)
NEUTROS PCT: 55 %
NRBC: 0 % (ref 0.0–0.2)
PLATELETS: 186 10*3/uL (ref 150–400)
RBC: 3.94 MIL/uL — AB (ref 4.22–5.81)
RDW: 13.1 % (ref 11.5–15.5)
WBC: 6.5 10*3/uL (ref 4.0–10.5)

## 2018-02-26 LAB — POC OCCULT BLOOD, ED: Fecal Occult Bld: NEGATIVE

## 2018-02-26 MED ORDER — CEPHALEXIN 500 MG PO CAPS
500.0000 mg | ORAL_CAPSULE | Freq: Once | ORAL | Status: AC
  Administered 2018-02-26: 500 mg via ORAL
  Filled 2018-02-26: qty 1

## 2018-02-26 MED ORDER — CEPHALEXIN 500 MG PO CAPS: ORAL_CAPSULE | ORAL | 0 refills | Status: DC

## 2018-02-26 NOTE — ED Provider Notes (Signed)
[facility] Provider [facility]   CSN: [id number] Arrival date & time: 02/26/18  1301   Level 5 caveat.  Patient nonverbal, mentally retarded. History is obtained from his aunt and legal guardian who accompanies him  History   Chief Complaint Chief Complaint  Patient presents with  . Hematuria    HPI Candis Shinelbert S Purk is a 67 y.o. male.  HPI Presents with possible hematuria for the past 2 weeks.  His aunt reports that another relative saw blood in his urine though she has not seen in herself.  She did see blood on sheet when he got up from a sitting position this morning.  She states that it is possible that blood came from his rectum she is uncertain where the blood came from.  He is had no fever.  No treatment prior to coming here he looks like his normal self per his aunt and he ate pork chops and macaroni and cheese today.  She took him to his primary care physician's office several days ago for same complaint however he could not produce a urine sample.  She brought him here thinking that perhaps we could catheterize him for urine Past Medical History:  Diagnosis Date  . Diabetes mellitus without complication (HCC)   . History of gout   . History of kidney stones   . Hypertension   . Mentally challenged   . Urinary retention     Patient Active Problem List   Diagnosis Date Noted  . Hydronephrosis with urinary obstruction due to ureteral calculus 06/20/2016  . Sepsis secondary to UTI (HCC) 06/20/2016  . Fever 06/19/2016  . Generalized weakness 06/19/2016  . Diabetes mellitus type 2, controlled (HCC) 06/19/2016  . SIRS (systemic inflammatory response syndrome) (HCC) 06/19/2016  . UTI (urinary tract infection) 06/19/2016  . Hematuria 06/19/2016  . AKI (acute kidney injury) (HCC) 06/19/2016  . Hyponatremia 06/19/2016  . Hypokalemia 06/19/2016  . BPH (benign prostatic hyperplasia) 06/19/2016  . 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 Urinary retention  05/07/2014  . Mental retardation 05/07/2014  . Hyperlipidemia 05/07/2014  . HTN (hypertension) 05/07/2014    Past Surgical History:  Procedure Laterality Date  . CYSTOSCOPY W/ URETERAL STENT PLACEMENT Bilateral 06/20/2016   Procedure: CYSTOSCOPY WITH RETROGRADE PYELOGRAM/URETERAL STENT PLACEMENT;  Surgeon: Malen GauzePatrick L McKenzie, MD;  Location: [facility];  Service: Urology;  Laterality: Bilateral;  . CYSTOSCOPY WITH RETROGRADE PYELOGRAM, URETEROSCOPY AND STENT PLACEMENT Bilateral 08/20/2016   Procedure: CYSTOSCOPY WITH BILATERAL RETROGRADE PYELOGRAM, BILATERAL URETEROSCOPY,  LASER LITHOTRIPSY OF RIGHT URETERAL CALCULI, STONE BASKET EXTRACTION LEFT URETERAL CALCULI  AND BILATERAL URETERAL STENT EXCHANGE;  Surgeon: Malen GauzeMcKenzie, Patrick L, MD;  Location: [facility];  Service: Urology;  Laterality: Bilateral;  . HOLMIUM LASER APPLICATION Bilateral 08/20/2016   Procedure: HOLMIUM LASER APPLICATION;  Surgeon: Malen GauzeMcKenzie, Patrick L, MD;  Location: [facility];  Service: Urology;  Laterality: Bilateral;        Home Medications    Prior to Admission medications   Medication Sig Start Date End Date Taking? Authorizing Provider  pantoprazole (PROTONIX) 40 MG tablet Take 1 tablet by mouth daily. 05/11/14  Yes [provider]  aspirin EC 81 MG tablet Take 81 mg by mouth every morning.     [provider]  atorvastatin (LIPITOR) 20 MG tablet Take 20 mg by mouth every evening.     [provider]  ciprofloxacin (CIPRO) 500 MG tablet Take 1 tablet (500 mg total) by mouth 2 (two) times daily. Patient not taking: Reported  on 08/15/2016 06/23/16   Philip Aspen, Limmie Patricia, MD  HYDROcodone-acetaminophen (NORCO) 5-325 MG tablet Take 1 tablet by mouth every 6 (six) hours as needed for moderate pain. 08/20/16   McKenzie, Mardene Celeste, MD  lisinopril (PRINIVIL,ZESTRIL) 10 MG tablet Take 1 tablet by mouth daily.    [provider]  metFORMIN (GLUCOPHAGE-XR) 500 MG 24 hr tablet Take 2 tablets by mouth every  morning.  04/22/14   [provider]  tamsulosin (FLOMAX) 0.4 MG CAPS capsule Take 0.4 mg by mouth daily.    [provider]    Family History No family history on file.  Social History Social History   Tobacco Use  . Smoking status: Never Smoker  . Smokeless tobacco: Never Used  Substance Use Topics  . Alcohol use: No  . Drug use: No     Allergies   Patient has no known allergies.   Review of Systems Review of Systems  Unable to perform ROS: Dementia     Physical Exam Updated Vital Signs BP 135/61 (BP Location: Left Arm)   Pulse 73   Temp 97.7 F (36.5 C) (Oral)   Resp 16   Wt 72.6 kg   SpO2 100%   BMI 25.82 kg/m   Physical Exam  Constitutional: No distress.  HENT:  Head: Normocephalic and atraumatic.  Eyes: Pupils are equal, round, and reactive to light. Conjunctivae are normal.  Neck: Neck supple. No tracheal deviation present. No thyromegaly present.  Cardiovascular: Normal rate and regular rhythm.  No murmur heard. Pulmonary/Chest: Effort normal and breath sounds normal.  Abdominal: Soft. Bowel sounds are normal. He exhibits no distension. There is no tenderness.  Ventral hernia soft spontaneously reducible,  Genitourinary: Rectum normal and penis normal.  Genitourinary Comments: Uncircumcized. No blood at meatus.  Rectum normal tone brown stool no gross blood  Musculoskeletal: Normal range of motion. He exhibits no edema or tenderness.  Neurological: He is alert. Coordination normal.  Moves all extremities follows simple commands  Skin: Skin is warm and dry. Capillary refill takes less than 2 seconds. No rash noted.  Psychiatric:  Unobtainable, patient nonverbal  Nursing note and vitals reviewed.    ED Treatments / Results  Labs (all labs ordered are listed, but only abnormal results are displayed) Labs Reviewed  URINE CULTURE  URINALYSIS, ROUTINE W REFLEX MICROSCOPIC    EKG None  Radiology No results  found.  Procedures Procedures (including critical care time)  Medications Ordered in ED Medications - No data to display Results for orders placed or performed during the hospital encounter of 02/26/18  Urinalysis, Routine w reflex microscopic  Result Value Ref Range   Color, Urine AMBER (A) YELLOW   APPearance CLOUDY (A) CLEAR   Specific Gravity, Urine 1.016 1.005 - 1.030   pH 6.0 5.0 - 8.0   Glucose, UA NEGATIVE NEGATIVE mg/dL   Hgb urine dipstick LARGE (A) NEGATIVE   Bilirubin Urine NEGATIVE NEGATIVE   Ketones, ur NEGATIVE NEGATIVE mg/dL   Protein, ur 161 (A) NEGATIVE mg/dL   Nitrite NEGATIVE NEGATIVE   Leukocytes, UA MODERATE (A) NEGATIVE   RBC / HPF >50 (H) 0 - 5 RBC/hpf   WBC, UA >50 (H) 0 - 5 WBC/hpf   Bacteria, UA RARE (A) NONE SEEN   Squamous Epithelial / LPF 0-5 0 - 5   WBC Clumps PRESENT    Non Squamous Epithelial 0-5 (A) NONE SEEN  CBC with Differential/Platelet  Result Value Ref Range   WBC 6.5 4.0 - 10.5  K/uL   RBC 3.94 (L) 4.22 - 5.81 MIL/uL   Hemoglobin 11.6 (L) 13.0 - 17.0 g/dL   HCT 09.8 (L) 11.9 - 14.7 %   MCV 94.7 80.0 - 100.0 fL   MCH 29.4 26.0 - 34.0 pg   MCHC 31.1 30.0 - 36.0 g/dL   RDW 82.9 56.2 - 13.0 %   Platelets 186 150 - 400 K/uL   nRBC 0.0 0.0 - 0.2 %   Neutrophils Relative % 55 %   Neutro Abs 3.7 1.7 - 7.7 K/uL   Lymphocytes Relative 32 %   Lymphs Abs 2.1 0.7 - 4.0 K/uL   Monocytes Relative 8 %   Monocytes Absolute 0.5 0.1 - 1.0 K/uL   Eosinophils Relative 4 %   Eosinophils Absolute 0.3 0.0 - 0.5 K/uL   Basophils Relative 1 %   Basophils Absolute 0.0 0.0 - 0.1 K/uL   Immature Granulocytes 0 %   Abs Immature Granulocytes 0.02 0.00 - 0.07 K/uL  Comprehensive metabolic panel  Result Value Ref Range   Sodium 137 135 - 145 mmol/L   Potassium 3.7 3.5 - 5.1 mmol/L   Chloride 102 98 - 111 mmol/L   CO2 29 22 - 32 mmol/L   Glucose, Bld 126 (H) 70 - 99 mg/dL   BUN 14 8 - 23 mg/dL   Creatinine, Ser 8.65 0.61 - 1.24 mg/dL   Calcium 9.2 8.9  - 78.4 mg/dL   Total Protein 7.1 6.5 - 8.1 g/dL   Albumin 3.5 3.5 - 5.0 g/dL   AST 13 (L) 15 - 41 U/L   ALT 10 0 - 44 U/L   Alkaline Phosphatase 55 38 - 126 U/L   Total Bilirubin 0.6 0.3 - 1.2 mg/dL   GFR calc non Af Amer >60 >60 mL/min   GFR calc Af Amer >60 >60 mL/min   Anion gap 6 5 - 15  POC occult blood, ED Provider will collect  Result Value Ref Range   Fecal Occult Bld NEGATIVE NEGATIVE   No results found.  Initial Impression / Assessment and Plan / ED Course  I have reviewed the triage vital signs and the nursing notes.  Pertinent labs & imaging results that were available during my care of the patient were reviewed by me and considered in my medical decision making (see chart for details).     5:15 PM patient resting comfortably.  Lab work consistent with microscopic hematuria and urinary tract infection, mild anemia which is improved over one year ago otherwise unremarkable.  Plan urine sent for culture.  Prescription Keflex.  First dose to be given here.  Follow-up with PMD.  Urine should be rechecked in a week  Final Clinical Impressions(s) / ED Diagnoses  dx #1 urinary tract infection #2 microscopic hematuria #3 anemia Final diagnoses:  None    ED Discharge Orders    None       Doug Sou, MD 02/26/18 1725

## 2018-02-26 NOTE — ED Triage Notes (Signed)
Pts family member reports that pt has had blood in urine for approx 1 1/2 week. Pt pushes on stomach when ask if he hurts. Also reports that when he was wiped there may have been blood in stool, but is unsure where it came from. Reports that he has had loose stools

## 2018-02-26 NOTE — Discharge Instructions (Signed)
[name] should take the antibiotic as prescribed.  Have him follow-up with his primary care physician in a week to get his urine rechecked.  If he continues to have blood in his urine he may need to see a urologist

## 2018-02-26 NOTE — ED Notes (Addendum)
Pt's discharge papers were left, called home number listed and spoke with pt's aunt, she will have someone come pick d/c papers and RX, d/c papers placed in envelope and gave to registration

## 2018-02-28 LAB — URINE CULTURE

## 2018-03-01 ENCOUNTER — Telehealth: Payer: Self-pay

## 2018-03-01 NOTE — Telephone Encounter (Signed)
Post ED Visit - Positive Culture Follow-up  Culture report reviewed by antimicrobial stewardship pharmacist:  []  Enzo BiNathan Batchelder, Pharm.D. []  Celedonio MiyamotoJeremy Frens, [address] BCPS AQ-ID [x]  Garvin FilaMike Maccia, Pharm.D., BCPS []  Georgina PillionElizabeth Martin, Pharm.D., BCPS []  BooneMinh Pham, [address]Ph[address] BCPS, AAHIVP []  Estella HuskMichelle Turner, Pharm.D., BCPS, AAHIVP []  Lysle Pearlachel Rumbarger, PharmD, BCPS []  Phillips Climeshuy Dang, PharmD, BCPS []  Agapito GamesAlison Masters, PharmD, BCPS []  Verlan FriendsErin Deja, PharmD  Positive urine culture Treated with Cephalexin, organism sensitive to the same and no further patient follow-up is required at this time.  Jerry CarasCullom, Shaleen Talamantez Burnett 03/01/2018, 10:26 AM

## 2018-04-03 ENCOUNTER — Emergency Department (HOSPITAL_COMMUNITY): Payer: Medicare Other

## 2018-04-03 ENCOUNTER — Other Ambulatory Visit: Payer: Self-pay

## 2018-04-03 ENCOUNTER — Encounter (HOSPITAL_COMMUNITY): Payer: Self-pay

## 2018-04-03 ENCOUNTER — Emergency Department (HOSPITAL_COMMUNITY)
Admission: EM | Admit: 2018-04-03 | Discharge: 2018-04-03 | Disposition: A | Discharge status: Home / Self Care | Payer: Medicare Other | Attending: Emergency Medicine | Admitting: Emergency Medicine

## 2018-04-03 DIAGNOSIS — F79 Unspecified intellectual disabilities: Secondary | ICD-10-CM | POA: Diagnosis not present

## 2018-04-03 DIAGNOSIS — Z7982 Long term (current) use of aspirin: Secondary | ICD-10-CM | POA: Insufficient documentation

## 2018-04-03 DIAGNOSIS — Z79899 Other long term (current) drug therapy: Secondary | ICD-10-CM | POA: Insufficient documentation

## 2018-04-03 DIAGNOSIS — I10 Essential (primary) hypertension: Secondary | ICD-10-CM | POA: Diagnosis not present

## 2018-04-03 DIAGNOSIS — E119 Type 2 diabetes mellitus without complications: Secondary | ICD-10-CM | POA: Diagnosis not present

## 2018-04-03 DIAGNOSIS — Z7984 Long term (current) use of oral hypoglycemic drugs: Secondary | ICD-10-CM | POA: Insufficient documentation

## 2018-04-03 DIAGNOSIS — W19XXXA Unspecified fall, initial encounter: Secondary | ICD-10-CM

## 2018-04-03 DIAGNOSIS — M79605 Pain in left leg: Secondary | ICD-10-CM | POA: Diagnosis present

## 2018-04-03 MED ORDER — BACITRACIN-NEOMYCIN-POLYMYXIN 400-5-5000 EX OINT
TOPICAL_OINTMENT | Freq: Once | CUTANEOUS | Status: AC
  Administered 2018-04-03: 1 via TOPICAL
  Filled 2018-04-03: qty 1

## 2018-04-03 NOTE — ED Notes (Signed)
Pt back from x-ray.

## 2018-04-03 NOTE — ED Notes (Signed)
Have spoken with APS

## 2018-04-03 NOTE — ED Notes (Signed)
Pt to xray

## 2018-04-03 NOTE — Progress Notes (Addendum)
[name] CSW received consult requesting APS report be made for pt. CSW spoke with medical team caring for pt. Medical team reported that pt "smells like sitting in urine/ feces for days (smells like ammonia), feces in his pants and down his legs, toenails and feet look like haven't been looked at in months, toenails extremely overgrown, very raw skin in genital area." Per team, a family member just arrived to the ED and is in his room now. Pt is MR and limited verbally. Pt's chart states he has a legal guardian.   [facility].   [place] [facility] [place] [phone]. CSW awaiting call back from on call social worker with APS.   [place] [facility] [place] [facility]. CSW awaiting call back from on call social worker.   CSW completed [facility] [place] [facility] [place] social worker, Emergency planning/management officerAngie. Per on call, Angie, report will be screened in.    [name], Silverio LayLCSWA [facility]  [phone]

## 2018-04-03 NOTE — ED Triage Notes (Signed)
Pt is MR and is nonverbal. EMS states he fell out of bed on Tuesday and is having Left shin pain. Is in NAD.

## 2018-04-03 NOTE — ED Provider Notes (Addendum)
[facility] Provider [facility]   CSN: [id number] Arrival date & time: 04/03/18  1538     History   Chief Complaint Chief Complaint  Patient presents with  . Leg Pain    HPI Candis Shinelbert S Standre is a 67 y.o. male.  HPI Patient has MR, level 5 caveat. Patient himself does seemingly respond to simple questions with appropriate answers, seemingly denies pain anywhere aside from his lower extremities, left primarily. Reportedly the patient had a fall, mechanical, 2 days ago. Family notes that the patient was observed to fall from his bed, striking both lower extremities. It is unclear if he has been ambulatory since that time. It is unclear if the patient has received any medication since that time.   Past Medical History:  Diagnosis Date  . Diabetes mellitus without complication (HCC)   . History of gout   . History of kidney stones   . Hypertension   . Mentally challenged   . Urinary retention     Patient Active Problem List   Diagnosis Date Noted  . Hydronephrosis with urinary obstruction due to ureteral calculus 06/20/2016  . Sepsis secondary to UTI (HCC) 06/20/2016  . Fever 06/19/2016  . Generalized weakness 06/19/2016  . Diabetes mellitus type 2, controlled (HCC) 06/19/2016  . SIRS (systemic inflammatory response syndrome) (HCC) 06/19/2016  . UTI (urinary tract infection) 06/19/2016  . Hematuria 06/19/2016  . AKI (acute kidney injury) (HCC) 06/19/2016  . Hyponatremia 06/19/2016  . Hypokalemia 06/19/2016  . BPH (benign prostatic hyperplasia) 06/19/2016  . 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 Urinary retention 05/07/2014  . Mental retardation 05/07/2014  . Hyperlipidemia 05/07/2014  . HTN (hypertension) 05/07/2014    Past Surgical History:  Procedure Laterality Date  . CYSTOSCOPY W/ URETERAL STENT PLACEMENT Bilateral 06/20/2016   Procedure: CYSTOSCOPY WITH RETROGRADE PYELOGRAM/URETERAL STENT PLACEMENT;  Surgeon: Malen GauzePatrick L McKenzie, MD;   Location: [facility];  Service: Urology;  Laterality: Bilateral;  . CYSTOSCOPY WITH RETROGRADE PYELOGRAM, URETEROSCOPY AND STENT PLACEMENT Bilateral 08/20/2016   Procedure: CYSTOSCOPY WITH BILATERAL RETROGRADE PYELOGRAM, BILATERAL URETEROSCOPY,  LASER LITHOTRIPSY OF RIGHT URETERAL CALCULI, STONE BASKET EXTRACTION LEFT URETERAL CALCULI  AND BILATERAL URETERAL STENT EXCHANGE;  Surgeon: Malen GauzeMcKenzie, Patrick L, MD;  Location: [facility];  Service: Urology;  Laterality: Bilateral;  . HOLMIUM LASER APPLICATION Bilateral 08/20/2016   Procedure: HOLMIUM LASER APPLICATION;  Surgeon: Malen GauzeMcKenzie, Patrick L, MD;  Location: [facility];  Service: Urology;  Laterality: Bilateral;        Home Medications    Prior to Admission medications   Medication Sig Start Date End Date Taking? Authorizing Provider  aspirin EC 81 MG tablet Take 81 mg by mouth every morning.     [provider]  cephALEXin (KEFLEX) 500 MG capsule 1 capsule 3 times daily 02/26/18   Doug SouJacubowitz, Sam, MD  cephALEXin (KEFLEX) 500 MG capsule 1 capsule 3 times daily 02/26/18   Doug SouJacubowitz, Sam, MD  lisinopril (PRINIVIL,ZESTRIL) 10 MG tablet Take 1 tablet by mouth every morning.     [provider]  metFORMIN (GLUCOPHAGE) 500 MG tablet Take 1,000 mg by mouth every morning. 02/03/18   [provider]  tamsulosin (FLOMAX) 0.4 MG CAPS capsule Take 0.4 mg by mouth every evening.     [provider]    Family History No family history on file.  Social History Social History   Tobacco Use  . Smoking status: Never Smoker  . Smokeless tobacco: Never Used  Substance Use Topics  . Alcohol use: No  .  Drug use: No     Allergies   Patient has no known allergies.   Review of Systems Review of Systems  Unable to perform ROS: Other  Mental retardation   Physical Exam Updated Vital Signs BP (!) 114/102 (BP Location: Left Arm)   Pulse (!) 104   Temp (!) 97.5 F (36.4 C) (Oral)   Resp 12   SpO2 100%   Physical  Exam Vitals signs and nursing note reviewed.  Constitutional:      General: He is not in acute distress.    Appearance: He is well-developed.     Comments: Thin elderly male in no distress moving both legs spontaneously. Labs with fecal matter and urine in the gurney.  HENT:     Head: Normocephalic and atraumatic.  Eyes:     Conjunctiva/sclera: Conjunctivae normal.  Cardiovascular:     Rate and Rhythm: Normal rate and regular rhythm.  Pulmonary:     Effort: Pulmonary effort is normal. No respiratory distress.     Breath sounds: No stridor.  Abdominal:     General: There is no distension.  Musculoskeletal:     Comments: No appreciable deformity in either lower extremity, the patient moves both legs spontaneously, with capacity to flex and extend both hips, knees, ankles. There is pain described in the leg, left, about the knee, and shin, and right shin. On both shins, there are multiple areas of abrasion, no active bleeding, no confluent erythema.   Skin:    General: Skin is warm and dry.  Neurological:     Mental Status: He is alert and oriented to person, place, and time.      ED Treatments / Results  Labs (all labs ordered are listed, but only abnormal results are displayed) Labs Reviewed - No data to display  EKG None  Radiology Dg Tibia/fibula Left  Result Date: 04/03/2018 CLINICAL DATA:  Larey SeatFell out of bed several days ago, now with left lower leg pain and bruising EXAM: LEFT TIBIA AND FIBULA - 2 VIEW COMPARISON:  None FINDINGS: The left tibia and fibula appear intact and normally aligned. No acute fracture is seen. No periosteal reaction is noted. IMPRESSION: Negative. Electronically Signed   By: Dwyane DeePaul  Barry M.D.   On: 04/03/2018 16:44   Dg Tibia/fibula Right  Result Date: 04/03/2018 CLINICAL DATA:  Larey SeatFell out of bed several days ago, lower leg pain and bruising EXAM: RIGHT TIBIA AND FIBULA - 2 VIEW COMPARISON:  None. FINDINGS: The right tibia and fibula are intact  and normally aligned. No fracture is seen. No periosteal reaction is noted. IMPRESSION: Negative. Electronically Signed   By: Dwyane DeePaul  Barry M.D.   On: 04/03/2018 16:46   Dg Knee Complete 4 Views Left  Result Date: 04/03/2018 CLINICAL DATA:  Larey SeatFell out of bed several days ago, left knee pain EXAM: LEFT KNEE - COMPLETE 4+ VIEW COMPARISON:  None. FINDINGS: The left knee joint spaces appear normal. No fracture is seen. No joint effusion is noted. IMPRESSION: Negative. Electronically Signed   By: Dwyane DeePaul  Barry M.D.   On: 04/03/2018 16:45    Procedures Procedures (including critical care time)  Medications Ordered in ED Medications - No data to display   Initial Impression / Assessment and Plan / ED Course  I have reviewed the triage vital signs and the nursing notes.  Pertinent labs & imaging results that were available during my care of the patient were reviewed by me and considered in my medical decision making (see chart for  details).     4:50 PM Patient in no distress on repeat exam. No evidence for fracture on any of the x-rays He continues to move all extremity spontaneously. He does have some cutaneous changes, though no evidence for cellulitis, and given the passage of time since the wounds were sustained, absence of bleeding, nothing is amenable to laceration repair.   With the patient's age, mental retardation I discussed this case with our social work Acupuncturist.  6:19 PM The patient's family member, who is also his caregiver is now present. She notes that the patient did have a fall 2 days ago, has been hesitant to move, secondary to pain. She suggests that the patient's contamination with fecal matter in urine was due to his inability moved to be cleaned. I discussed the importance of good hygiene. Patient's case has been discussed with social work, and referred to [facility], who may investigate patient's living situation. Absent distress, with no evidence for  fracture, after the patient had wound care provided here, he will, have her return home, with his family member/caregiver, with close outpatient follow-up.   Final Clinical Impressions(s) / ED Diagnoses  Fall, initial encounter   Gerhard Munch, MD 04/03/18 1650    Gerhard Munch, MD 04/03/18 Zollie Pee

## 2018-04-03 NOTE — Discharge Instructions (Addendum)
As discussed, your evaluation today has been largely reassuring.  But, it is important that you monitor your condition carefully, and do not hesitate to return to the ED if you develop new, or concerning changes in your condition.  Otherwise, please follow-up with your physician for appropriate ongoing care.   Please be sure to keep your wounds clean, dry. Use Tylenol for pain control.

## 2018-04-03 NOTE — ED Notes (Signed)
Pt was covered with urine and feces. Have thoroughly cleaned pt and changed him into a gown. Healing lacerations to both shins.

## 2018-04-04 NOTE — Clinical Social Work Note (Signed)
LCSW advised Nilda CalamityCarrie Dickerson and Kym Groomarrie Friese with [facility] that patient was no longer in the ED and had been discharged.    Linsay Vogt, Juleen ChinaHeather D, LCSW

## 2018-04-07 ENCOUNTER — Inpatient Hospital Stay (HOSPITAL_COMMUNITY): Payer: Medicare Other

## 2018-04-07 ENCOUNTER — Emergency Department (HOSPITAL_COMMUNITY): Payer: Medicare Other

## 2018-04-07 ENCOUNTER — Other Ambulatory Visit: Payer: Self-pay

## 2018-04-07 ENCOUNTER — Encounter (HOSPITAL_COMMUNITY): Payer: Self-pay | Admitting: Emergency Medicine

## 2018-04-07 ENCOUNTER — Inpatient Hospital Stay (HOSPITAL_COMMUNITY)
Admission: EM | Admit: 2018-04-07 | Discharge: 2018-04-12 | DRG: 683 | Disposition: A | Discharge status: Home / Self Care | Payer: Medicare Other | Attending: Internal Medicine | Admitting: Internal Medicine

## 2018-04-07 DIAGNOSIS — R339 Retention of urine, unspecified: Secondary | ICD-10-CM | POA: Diagnosis present

## 2018-04-07 DIAGNOSIS — N179 Acute kidney failure, unspecified: Principal | ICD-10-CM | POA: Diagnosis present

## 2018-04-07 DIAGNOSIS — Z7982 Long term (current) use of aspirin: Secondary | ICD-10-CM | POA: Diagnosis not present

## 2018-04-07 DIAGNOSIS — K668 Other specified disorders of peritoneum: Secondary | ICD-10-CM | POA: Diagnosis not present

## 2018-04-07 DIAGNOSIS — Z7984 Long term (current) use of oral hypoglycemic drugs: Secondary | ICD-10-CM

## 2018-04-07 DIAGNOSIS — E1165 Type 2 diabetes mellitus with hyperglycemia: Secondary | ICD-10-CM | POA: Diagnosis not present

## 2018-04-07 DIAGNOSIS — L8990 Pressure ulcer of unspecified site, unspecified stage: Secondary | ICD-10-CM

## 2018-04-07 DIAGNOSIS — R197 Diarrhea, unspecified: Secondary | ICD-10-CM | POA: Diagnosis present

## 2018-04-07 DIAGNOSIS — K567 Ileus, unspecified: Secondary | ICD-10-CM | POA: Diagnosis present

## 2018-04-07 DIAGNOSIS — Z79899 Other long term (current) drug therapy: Secondary | ICD-10-CM

## 2018-04-07 DIAGNOSIS — N132 Hydronephrosis with renal and ureteral calculous obstruction: Secondary | ICD-10-CM | POA: Diagnosis present

## 2018-04-07 DIAGNOSIS — N400 Enlarged prostate without lower urinary tract symptoms: Secondary | ICD-10-CM | POA: Diagnosis present

## 2018-04-07 DIAGNOSIS — E118 Type 2 diabetes mellitus with unspecified complications: Secondary | ICD-10-CM

## 2018-04-07 DIAGNOSIS — A0472 Enterocolitis due to Clostridium difficile, not specified as recurrent: Secondary | ICD-10-CM | POA: Diagnosis not present

## 2018-04-07 DIAGNOSIS — E876 Hypokalemia: Secondary | ICD-10-CM | POA: Diagnosis present

## 2018-04-07 DIAGNOSIS — N401 Enlarged prostate with lower urinary tract symptoms: Secondary | ICD-10-CM | POA: Diagnosis not present

## 2018-04-07 DIAGNOSIS — E119 Type 2 diabetes mellitus without complications: Secondary | ICD-10-CM

## 2018-04-07 DIAGNOSIS — R140 Abdominal distension (gaseous): Secondary | ICD-10-CM

## 2018-04-07 DIAGNOSIS — F79 Unspecified intellectual disabilities: Secondary | ICD-10-CM | POA: Diagnosis not present

## 2018-04-07 DIAGNOSIS — I10 Essential (primary) hypertension: Secondary | ICD-10-CM | POA: Diagnosis not present

## 2018-04-07 DIAGNOSIS — N320 Bladder-neck obstruction: Secondary | ICD-10-CM | POA: Diagnosis not present

## 2018-04-07 DIAGNOSIS — N1330 Unspecified hydronephrosis: Secondary | ICD-10-CM | POA: Diagnosis present

## 2018-04-07 DIAGNOSIS — R4701 Aphasia: Secondary | ICD-10-CM | POA: Diagnosis not present

## 2018-04-07 LAB — CBC
HEMATOCRIT: 37.3 % — AB (ref 39.0–52.0)
Hemoglobin: 12.6 g/dL — ABNORMAL LOW (ref 13.0–17.0)
MCH: 28.9 pg (ref 26.0–34.0)
MCHC: 33.8 g/dL (ref 30.0–36.0)
MCV: 85.6 fL (ref 80.0–100.0)
NRBC: 0 % (ref 0.0–0.2)
PLATELETS: 207 10*3/uL (ref 150–400)
RBC: 4.36 MIL/uL (ref 4.22–5.81)
RDW: 13.1 % (ref 11.5–15.5)
WBC: 15.2 10*3/uL — ABNORMAL HIGH (ref 4.0–10.5)

## 2018-04-07 LAB — BASIC METABOLIC PANEL
ANION GAP: 11 (ref 5–15)
BUN: 142 mg/dL — ABNORMAL HIGH (ref 8–23)
CALCIUM: 8.7 mg/dL — AB (ref 8.9–10.3)
CHLORIDE: 99 mmol/L (ref 98–111)
CO2: 19 mmol/L — AB (ref 22–32)
Creatinine, Ser: 5.85 mg/dL — ABNORMAL HIGH (ref 0.61–1.24)
GFR calc non Af Amer: 9 mL/min — ABNORMAL LOW (ref >60)
GFR, EST AFRICAN AMERICAN: 11 mL/min — AB (ref >60)
Glucose, Bld: 503 mg/dL (ref 70–99)
Potassium: 3.7 mmol/L (ref 3.5–5.1)
Sodium: 129 mmol/L — ABNORMAL LOW (ref 135–145)

## 2018-04-07 LAB — LACTIC ACID, PLASMA
Lactic Acid, Venous: 2.4 mmol/L (ref 0.5–1.9)
Lactic Acid, Venous: 2.1 mmol/L (ref 0.5–1.9)

## 2018-04-07 LAB — DIFFERENTIAL
BASOS ABS: 0.1 10*3/uL (ref 0.0–0.1)
BASOS PCT: 0 %
Eosinophils Absolute: 0 10*3/uL (ref 0.0–0.5)
Eosinophils Relative: 0 %
Lymphocytes Relative: 4 %
Lymphs Abs: 0.6 10*3/uL — ABNORMAL LOW (ref 0.7–4.0)
MONOS PCT: 6 %
Monocytes Absolute: 0.9 10*3/uL (ref 0.1–1.0)
NEUTROS ABS: 13.3 10*3/uL — AB (ref 1.7–7.7)
NEUTROS PCT: 88 %

## 2018-04-07 LAB — CBG MONITORING, ED
Glucose-Capillary: 488 mg/dL — ABNORMAL HIGH (ref 70–99)
Glucose-Capillary: 228 mg/dL — ABNORMAL HIGH (ref 70–99)

## 2018-04-07 LAB — GLUCOSE, CAPILLARY: Glucose-Capillary: 83 mg/dL (ref 70–99)

## 2018-04-07 MED ORDER — ENOXAPARIN SODIUM 40 MG/0.4ML ~~LOC~~ SOLN
40.0000 mg | SUBCUTANEOUS | Status: DC
  Administered 2018-04-07: 40 mg via SUBCUTANEOUS
  Filled 2018-04-07: qty 0.4

## 2018-04-07 MED ORDER — PIPERACILLIN-TAZOBACTAM 3.375 G IVPB
3.3750 g | Freq: Once | INTRAVENOUS | Status: AC
  Administered 2018-04-08: 3.375 g via INTRAVENOUS
  Filled 2018-04-07: qty 50

## 2018-04-07 MED ORDER — INSULIN ASPART 100 UNIT/ML ~~LOC~~ SOLN
0.0000 [IU] | Freq: Three times a day (TID) | SUBCUTANEOUS | Status: DC
  Administered 2018-04-08: 5 [IU] via SUBCUTANEOUS
  Administered 2018-04-08: 2 [IU] via SUBCUTANEOUS
  Administered 2018-04-08: 1 [IU] via SUBCUTANEOUS
  Administered 2018-04-09 (×2): 9 [IU] via SUBCUTANEOUS
  Administered 2018-04-09 – 2018-04-10 (×2): 2 [IU] via SUBCUTANEOUS
  Administered 2018-04-10: 1 [IU] via SUBCUTANEOUS
  Administered 2018-04-10: 2 [IU] via SUBCUTANEOUS
  Administered 2018-04-11 – 2018-04-12 (×3): 1 [IU] via SUBCUTANEOUS

## 2018-04-07 MED ORDER — INSULIN ASPART 100 UNIT/ML ~~LOC~~ SOLN
15.0000 [IU] | Freq: Once | SUBCUTANEOUS | Status: AC
  Administered 2018-04-07: 15 [IU] via SUBCUTANEOUS
  Filled 2018-04-07: qty 1

## 2018-04-07 MED ORDER — SODIUM CHLORIDE 0.9 % IV BOLUS
1000.0000 mL | Freq: Once | INTRAVENOUS | Status: AC
  Administered 2018-04-07: 1000 mL via INTRAVENOUS

## 2018-04-07 MED ORDER — IOPAMIDOL (ISOVUE-300) INJECTION 61%
50.0000 mL | Freq: Once | INTRAVENOUS | Status: AC | PRN
  Administered 2018-04-07: 30 mL via ORAL

## 2018-04-07 MED ORDER — SODIUM CHLORIDE 0.9 % IV SOLN
INTRAVENOUS | Status: DC
  Administered 2018-04-07 – 2018-04-08 (×2): via INTRAVENOUS

## 2018-04-07 NOTE — ED Triage Notes (Signed)
Patient non verbal and has history of MR. Caregiver reports decreased appetite and CBG 448.

## 2018-04-07 NOTE — Progress Notes (Signed)
CRITICAL VALUE ALERT  Critical Value:  Lactic Acid, 2.1  Date & Time Notied:  04/07/18, 2150  Provider Notified: Schorr  Orders Received/Actions taken:

## 2018-04-07 NOTE — Progress Notes (Signed)
Pharmacy Antibiotic Note  [name] is a 67 y.o. male admitted on 04/07/2018 with sepsis.  Pharmacy has been consulted for Zosyn dosing.  Plan: Zosyn 3.375gm IV x 1 Monitor labs, micro and vitals.  F/U in AM for further dosing.  Weight: 165 lb (74.8 kg)  Temp (24hrs), Avg:98.3 F (36.8 C), Min:98.2 F (36.8 C), Max:98.4 F (36.9 C)  Recent Labs  Lab 04/07/18 1631 04/07/18 1902 04/07/18 2108  WBC 15.2*  --   --   CREATININE 5.85*  --   --   LATICACIDVEN  --  2.4* 2.1*    CrCl cannot be calculated (Unknown ideal weight.).    No Known Allergies  Antimicrobials this admission: Zosyn 10/23 >>    >>   Dose adjustments this admission: n/a   Microbiology results:  BCx:   UCx:    Sputum:    MRSA PCR:   Thank you for allowing pharmacy to be a part of this patient's care.  Mady GemmaHayes, Sarenity Ramaker R 04/07/2018 10:29 PM

## 2018-04-07 NOTE — ED Notes (Signed)
Date and time results received: 04/07/18 1710 (use smartphrase ".now" to insert current time)  Test: glucose Critical Value: 503  Name of Provider Notified: dr zammit  Orders Received? Or Actions Taken?: see chart

## 2018-04-07 NOTE — ED Notes (Signed)
Date and time results received: 04/07/18 1936 (use smartphrase ".now" to insert current time)  Test: lactic acid Critical Value: 2.4  Name of Provider Notified: Dr. Onalee Huaavid notified at 1951  Orders Received? Or Actions Taken?: no/na

## 2018-04-07 NOTE — ED Provider Notes (Signed)
[facility] EMERGENCY DEPARTMENT Provider Note   CSN: [id number] Arrival date & time: 04/07/18  1528     History   Chief Complaint Chief Complaint  Patient presents with  . Hyperglycemia    HPI [name] is a 67 y.o. male.  Patient was brought in because he has had diarrhea for about 10 days.  Patient has a history of mental problems and is taken care of by his aunt.  The history is provided by a relative. No language interpreter was used.  Illness  This is a new problem. The current episode started more than 1 week ago. The problem occurs constantly. The problem has not changed since onset.Pertinent negatives include no chest pain, no abdominal pain and no headaches. Nothing aggravates the symptoms. Nothing relieves the symptoms. He has tried nothing for the symptoms. The treatment provided no relief.    Past Medical History:  Diagnosis Date  . Diabetes mellitus without complication (HCC)   . History of gout   . History of kidney stones   . Hypertension   . Mentally challenged   . Urinary retention     Patient Active Problem List   Diagnosis Date Noted  . Diarrhea 04/07/2018  . Hydronephrosis with urinary obstruction due to ureteral calculus 06/20/2016  . Sepsis secondary to UTI (HCC) 06/20/2016  . Fever 06/19/2016  . Generalized weakness 06/19/2016  . Diabetes mellitus type 2, controlled (HCC) 06/19/2016  . SIRS (systemic inflammatory response syndrome) (HCC) 06/19/2016  . UTI (urinary tract infection) 06/19/2016  . Hematuria 06/19/2016  . AKI (acute kidney injury) (HCC) 06/19/2016  . Hyponatremia 06/19/2016  . Hypokalemia 06/19/2016  . BPH (benign prostatic hyperplasia) 06/19/2016  . 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 Urinary retention 05/07/2014  . Mental retardation 05/07/2014  . Hyperlipidemia 05/07/2014  . HTN (hypertension) 05/07/2014    Past Surgical History:  Procedure Laterality Date  . CYSTOSCOPY W/ URETERAL STENT PLACEMENT  Bilateral 06/20/2016   Procedure: CYSTOSCOPY WITH RETROGRADE PYELOGRAM/URETERAL STENT PLACEMENT;  Surgeon: Malen Gauze, MD;  Location: [facility];  Service: Urology;  Laterality: Bilateral;  . CYSTOSCOPY WITH RETROGRADE PYELOGRAM, URETEROSCOPY AND STENT PLACEMENT Bilateral 08/20/2016   Procedure: CYSTOSCOPY WITH BILATERAL RETROGRADE PYELOGRAM, BILATERAL URETEROSCOPY,  LASER LITHOTRIPSY OF RIGHT URETERAL CALCULI, STONE BASKET EXTRACTION LEFT URETERAL CALCULI  AND BILATERAL URETERAL STENT EXCHANGE;  Surgeon: Malen Gauze, MD;  Location: [facility];  Service: Urology;  Laterality: Bilateral;  . HOLMIUM LASER APPLICATION Bilateral 08/20/2016   Procedure: HOLMIUM LASER APPLICATION;  Surgeon: Malen Gauze, MD;  Location: [facility];  Service: Urology;  Laterality: Bilateral;        Home Medications    Prior to Admission medications   Medication Sig Start Date End Date Taking? Authorizing Provider  aspirin EC 81 MG tablet Take 81 mg by mouth every morning.     [provider]  cephALEXin (KEFLEX) 500 MG capsule 1 capsule 3 times daily 02/26/18   Doug Sou, MD  cephALEXin (KEFLEX) 500 MG capsule 1 capsule 3 times daily 02/26/18   Doug Sou, MD  lisinopril (PRINIVIL,ZESTRIL) 10 MG tablet Take 1 tablet by mouth every morning.     [provider]  metFORMIN (GLUCOPHAGE) 500 MG tablet Take 1,000 mg by mouth every morning. 02/03/18   [provider]  tamsulosin (FLOMAX) 0.4 MG CAPS capsule Take 0.4 mg by mouth every evening.     [provider]    Family History Family History  Family history unknown: Yes  Social History Social History   Tobacco Use  . Smoking status: Never Smoker  . Smokeless tobacco: Never Used  Substance Use Topics  . Alcohol use: No  . Drug use: No     Allergies   Patient has no known allergies.   Review of Systems Review of Systems  Constitutional: Negative for appetite change and fatigue.  HENT: Negative for  congestion, ear discharge and sinus pressure.   Eyes: Negative for discharge.  Respiratory: Negative for cough.   Cardiovascular: Negative for chest pain.  Gastrointestinal: Positive for diarrhea. Negative for abdominal pain.  Genitourinary: Negative for frequency and hematuria.  Musculoskeletal: Negative for back pain.  Skin: Negative for rash.  Neurological: Negative for seizures and headaches.  Psychiatric/Behavioral: Negative for hallucinations.     Physical Exam Updated Vital Signs BP (!) 126/56 (BP Location: Left Arm)   Pulse 93   Temp 98.2 F (36.8 C) (Temporal)   Resp 16   Wt 74.8 kg   SpO2 99%   BMI 26.63 kg/m   Physical Exam Constitutional:      Appearance: He is well-developed.  HENT:     Head: Normocephalic.     Nose: Nose normal.  Eyes:     General: No scleral icterus.    Conjunctiva/sclera: Conjunctivae normal.  Neck:     Musculoskeletal: Neck supple.     Thyroid: No thyromegaly.  Cardiovascular:     Rate and Rhythm: Normal rate and regular rhythm.     Heart sounds: No murmur. No friction rub. No gallop.   Pulmonary:     Breath sounds: No stridor. No wheezing or rales.  Chest:     Chest wall: No tenderness.  Abdominal:     General: There is no distension.     Tenderness: There is no abdominal tenderness. There is no rebound.  Musculoskeletal: Normal range of motion.  Lymphadenopathy:     Cervical: No cervical adenopathy.  Skin:    Findings: No erythema or rash.  Neurological:     Mental Status: He is alert.     Motor: No abnormal muscle tone.     Coordination: Coordination normal.     Comments: Alert oriented to person  Psychiatric:        Behavior: Behavior normal.      ED Treatments / Results  Labs (all labs ordered are listed, but only abnormal results are displayed) Labs Reviewed  BASIC METABOLIC PANEL - Abnormal; Notable for the following components:      Result Value   Sodium 129 (*)    CO2 19 (*)    Glucose, Bld 503 (*)     BUN 142 (*)    Creatinine, Ser 5.85 (*)    Calcium 8.7 (*)    GFR calc non Af Amer 9 (*)    GFR calc Af Amer 11 (*)    All other components within normal limits  CBC - Abnormal; Notable for the following components:   WBC 15.2 (*)    Hemoglobin 12.6 (*)    HCT 37.3 (*)    All other components within normal limits  DIFFERENTIAL - Abnormal; Notable for the following components:   Neutro Abs 13.3 (*)    Lymphs Abs 0.6 (*)    All other components within normal limits  CBG MONITORING, ED - Abnormal; Notable for the following components:   Glucose-Capillary 488 (*)    All other components within normal limits  C DIFFICILE QUICK SCREEN W PCR REFLEX  URINALYSIS, ROUTINE W REFLEX  MICROSCOPIC  CBG MONITORING, ED    EKG None  Radiology Dg Abd Acute W/chest  Result Date: 04/07/2018 CLINICAL DATA:  Patient has not eaten days. Diarrhea and weakness. EXAM: DG ABDOMEN ACUTE W/ 1V CHEST COMPARISON:  CT 06/19/2016 FINDINGS: Low lung volumes are identified on the frontal view of the chest with top normal size cardiac silhouette. Nonaneurysmal appearing thoracic aorta is noted. There is atelectasis at the right base. No overt pulmonary edema, pleural effusion or pneumothorax. No acute pulmonary consolidation. There is no free air beneath the diaphragm. Numerous renal calculi project over the left hemiabdomen, some possibly in the expected location of the renal pelvis. Mild diffuse gaseous distention of large bowel is noted without definite evidence of mechanical bowel obstruction. Surgical clips project over the lower pelvis with stable phlebolith in the left hemipelvis. Small amount of retained stool is seen in the rectum. Thoracolumbar spondylosis without acute osseous abnormality. IMPRESSION: 1. Low lung volumes with right basilar atelectasis. 2. Mild diffuse gaseous distention of large bowel without definite evidence of mechanical bowel obstruction. There is no free air. 3. Left-sided  nephrolithiasis, some potentially within the expected location of the renal pelvis. Electronically Signed   By: Tollie Ethavid  Kwon M.D.   On: 04/07/2018 17:25    Procedures Procedures (including critical care time)  Medications Ordered in ED Medications  sodium chloride 0.9 % bolus 1,000 mL (1,000 mLs Intravenous New Bag/Given 04/07/18 1641)  insulin aspart (novoLOG) injection 15 Units (15 Units Subcutaneous Given 04/07/18 1732)  sodium chloride 0.9 % bolus 1,000 mL (1,000 mLs Intravenous New Bag/Given 04/07/18 1733)     Initial Impression / Assessment and Plan / ED Course  I have reviewed the triage vital signs and the nursing notes.  Pertinent labs & imaging results that were available during my care of the patient were reviewed by me and considered in my medical decision making (see chart for details).    CRITICAL CARE Performed by: Bethann BerkshireJoseph Neosha Switalski Total critical care time: 40 minutes Critical care time was exclusive of separately billable procedures and treating other patients. Critical care was necessary to treat or prevent imminent or life-threatening deterioration. Critical care was time spent personally by me on the following activities: development of treatment plan with patient and/or surrogate as well as nursing, discussions with consultants, evaluation of patient's response to treatment, examination of patient, obtaining history from patient or surrogate, ordering and performing treatments and interventions, ordering and review of laboratory studies, ordering and review of radiographic studies, pulse oximetry and re-evaluation of patient's condition.   Patient with significant AKI.  He will be admitted to medicine.  And also patient has poor control of his blood glucose Final Clinical Impressions(s) / ED Diagnoses   Final diagnoses:  AKI (acute kidney injury) [facility](HCC)    ED Discharge Orders    None       [name], Merridith Dershem, MD 04/07/18 [phone]

## 2018-04-07 NOTE — H&P (Addendum)
History and Physical    [name] ZOX:[id number] DOB: May 01, 1950 DOA: 04/07/2018  PCP: [name], [name], MD  Patient coming from: home  Chief Complaint:  High sugar  HPI: [name] is a 67 y.o. male with medical history significant of mental retardation, diabetes with recent treatment for UTI in the last month comes in with uncontrolled sugars at home and diarrhea for over 2 weeks.  His caretakers with him right now he is also noticed a lot of abdominal distention.  He is not had any nausea vomiting.  No fevers that she is aware of.  Patient is nonverbal has not been acting like he is in pain.  Patient found to have acute kidney injury with a creatinine over 5 his baseline is normal.  Patient is being referred for admission for acute kidney injury.   Review of Systems: As per HPI otherwise 10 point review of systems negative per caretaker  Past Medical History:  Diagnosis Date  . Diabetes mellitus without complication (HCC)   . History of gout   . History of kidney stones   . Hypertension   . Mentally challenged   . Urinary retention     Past Surgical History:  Procedure Laterality Date  . CYSTOSCOPY W/ URETERAL STENT PLACEMENT Bilateral 06/20/2016   Procedure: CYSTOSCOPY WITH RETROGRADE PYELOGRAM/URETERAL STENT PLACEMENT;  Surgeon: Malen GauzePatrick L McKenzie, MD;  Location: [facility];  Service: Urology;  Laterality: Bilateral;  . CYSTOSCOPY WITH RETROGRADE PYELOGRAM, URETEROSCOPY AND STENT PLACEMENT Bilateral 08/20/2016   Procedure: CYSTOSCOPY WITH BILATERAL RETROGRADE PYELOGRAM, BILATERAL URETEROSCOPY,  LASER LITHOTRIPSY OF RIGHT URETERAL CALCULI, STONE BASKET EXTRACTION LEFT URETERAL CALCULI  AND BILATERAL URETERAL STENT EXCHANGE;  Surgeon: Malen GauzeMcKenzie, Patrick L, MD;  Location: [facility];  Service: Urology;  Laterality: Bilateral;  . HOLMIUM LASER APPLICATION Bilateral 08/20/2016   Procedure: HOLMIUM LASER APPLICATION;  Surgeon: Malen GauzeMcKenzie, Patrick L, MD;  Location: [facility];  Service: Urology;   Laterality: Bilateral;     reports that he has never smoked. He has never used smokeless tobacco. He reports that he does not drink alcohol or use drugs.  No Known Allergies  Family History  Family history unknown: Yes  unknown due to nonverbal status  Prior to Admission medications   Medication Sig Start Date End Date Taking? Authorizing Provider  aspirin EC 81 MG tablet Take 81 mg by mouth every morning.     [provider]  cephALEXin (KEFLEX) 500 MG capsule 1 capsule 3 times daily 02/26/18   Doug SouJacubowitz, Sam, MD  cephALEXin (KEFLEX) 500 MG capsule 1 capsule 3 times daily 02/26/18   Doug SouJacubowitz, Sam, MD  lisinopril (PRINIVIL,ZESTRIL) 10 MG tablet Take 1 tablet by mouth every morning.     [provider]  metFORMIN (GLUCOPHAGE) 500 MG tablet Take 1,000 mg by mouth every morning. 02/03/18   [provider]  tamsulosin (FLOMAX) 0.4 MG CAPS capsule Take 0.4 mg by mouth every evening.     [provider]    Physical Exam: Vitals:   04/07/18 1556  BP: (!) 126/56  Pulse: 93  Resp: 16  Temp: 98.2 F (36.8 C)  TempSrc: Temporal  SpO2: 99%  Weight: 74.8 kg      Constitutional: NAD, calm, comfortable Vitals:   04/07/18 1556  BP: (!) 126/56  Pulse: 93  Resp: 16  Temp: 98.2 F (36.8 C)  TempSrc: Temporal  SpO2: 99%  Weight: 74.8 kg   Eyes: PERRL, lids and conjunctivae normal ENMT: Mucous membranes are moist. Posterior pharynx clear  of any exudate or lesions.Normal dentition.  Neck: normal, supple, no masses, no thyromegaly Respiratory: clear to auscultation bilaterally, no wheezing, no crackles. Normal respiratory effort. No accessory muscle use.  Cardiovascular: Regular rate and rhythm, no murmurs / rubs / gallops. No extremity edema. 2+ pedal pulses. No carotid bruits.  Abdomen: no tenderness, no masses palpated. No hepatosplenomegaly. Bowel sounds positive.  Moderate distention no rebound no guarding Musculoskeletal: no clubbing /  cyanosis. No joint deformity upper and lower extremities. Good ROM, no contractures. Normal muscle tone.  Skin: no rashes, lesions, ulcers. No induration Neurologic: CN 2-12 grossly intact. Sensation intact, DTR normal. Strength 5/5 in all 4.  Psychiatric: Not agitated mentally challenged   Labs on Admission: I have personally reviewed following labs and imaging studies  CBC: Recent Labs  Lab 04/07/18 1631  WBC 15.2*  NEUTROABS 13.3*  HGB 12.6*  HCT 37.3*  MCV 85.6  PLT 207   Basic Metabolic Panel: Recent Labs  Lab 04/07/18 1631  NA 129*  K 3.7  CL 99  CO2 19*  GLUCOSE 503*  BUN 142*  CREATININE 5.85*  CALCIUM 8.7*   GFR: CrCl cannot be calculated (Unknown ideal weight.). Liver Function Tests: No results for input(s): AST, ALT, ALKPHOS, BILITOT, PROT, ALBUMIN in the last 168 hours. No results for input(s): LIPASE, AMYLASE in the last 168 hours. No results for input(s): AMMONIA in the last 168 hours. Coagulation Profile: No results for input(s): INR, PROTIME in the last 168 hours. Cardiac Enzymes: No results for input(s): CKTOTAL, CKMB, CKMBINDEX, TROPONINI in the last 168 hours. BNP (last 3 results) No results for input(s): PROBNP in the last 8760 hours. HbA1C: No results for input(s): HGBA1C in the last 72 hours. CBG: Recent Labs  Lab 04/07/18 1558  GLUCAP 488*   Lipid Profile: No results for input(s): CHOL, HDL, LDLCALC, TRIG, CHOLHDL, LDLDIRECT in the last 72 hours. Thyroid Function Tests: No results for input(s): TSH, T4TOTAL, FREET4, T3FREE, THYROIDAB in the last 72 hours. Anemia Panel: No results for input(s): VITAMINB12, FOLATE, FERRITIN, TIBC, IRON, RETICCTPCT in the last 72 hours. Urine analysis:    Component Value Date/Time   COLORURINE AMBER (A) 02/26/2018 1500   APPEARANCEUR CLOUDY (A) 02/26/2018 1500   LABSPEC 1.016 02/26/2018 1500   PHURINE 6.0 02/26/2018 1500   GLUCOSEU NEGATIVE 02/26/2018 1500   HGBUR LARGE (A) 02/26/2018 1500    BILIRUBINUR NEGATIVE 02/26/2018 1500   KETONESUR NEGATIVE 02/26/2018 1500   PROTEINUR 100 (A) 02/26/2018 1500   UROBILINOGEN 1.0 07/20/2014 1718   NITRITE NEGATIVE 02/26/2018 1500   LEUKOCYTESUR MODERATE (A) 02/26/2018 1500   Sepsis Labs: !!!!!!!!!!!!!!!!!!!!!!!!!!!!!!!!!!!!!!!!!!!! @LABRCNTIP (procalcitonin:4,lacticidven:4) )No results found for this or any previous visit (from the past 240 hour(s)).   Radiological Exams on Admission: Dg Abd Acute W/chest  Result Date: 04/07/2018 CLINICAL DATA:  Patient has not eaten days. Diarrhea and weakness. EXAM: DG ABDOMEN ACUTE W/ 1V CHEST COMPARISON:  CT 06/19/2016 FINDINGS: Low lung volumes are identified on the frontal view of the chest with top normal size cardiac silhouette. Nonaneurysmal appearing thoracic aorta is noted. There is atelectasis at the right base. No overt pulmonary edema, pleural effusion or pneumothorax. No acute pulmonary consolidation. There is no free air beneath the diaphragm. Numerous renal calculi project over the left hemiabdomen, some possibly in the expected location of the renal pelvis. Mild diffuse gaseous distention of large bowel is noted without definite evidence of mechanical bowel obstruction. Surgical clips project over the lower pelvis with stable phlebolith in the left hemipelvis.  Small amount of retained stool is seen in the rectum. Thoracolumbar spondylosis without acute osseous abnormality. IMPRESSION: 1. Low lung volumes with right basilar atelectasis. 2. Mild diffuse gaseous distention of large bowel without definite evidence of mechanical bowel obstruction. There is no free air. 3. Left-sided nephrolithiasis, some potentially within the expected location of the renal pelvis. Electronically Signed   By: Tollie Ethavid  Kwon M.D.   On: 04/07/2018 17:25   Old chart reviewed Case discussed with dr zammit in ED   Assessment/Plan 67 year old male with over 2 weeks of diarrhea comes in with acute kidney injury Principal  Problem:   AKI (acute kidney injury) (HCC)-patient on multiple nephrotoxic substances at home.  In the setting of GI losses we will hold all of his meds including his ACE inhibitor and metformin.  Check lactic acid level.  He is received 2 L of IV fluids in the emergency department continue IV fluids.  Monitor urinary output closely.  Active Problems:   Diarrhea-in the setting of significant abdominal distention will proceed with stat CT of abdomen and pelvis tonight.  Check C. difficile.  Keep n.p.o. except ice chips at this time. Abdominal exam is nonacute    Intellectual disability-noted    HTN (hypertension)-holding ACE inhibitor    Diabetes mellitus type 2, controlled (HCC)-holding metformin placed on sliding scale insulin    BPH (benign prostatic hyperplasia)-noted    Nonverbal-noted    DVT prophylaxis: lovenox Code Status: full Family Communication: Caretaker at bedside Disposition Plan: days Consults called:  none Admission status:  admission   Vennesa Bastedo A MD [facility]  If 7PM-7AM, please contact night-coverage [email or web address] Password [email or web address]  04/07/2018, 6:47 PM   CT abdomen pelvis shows free air unclear where it is from.  Spoke to the radiologist who called report.  Spoken to Dr. Lovell SheehanJenkins who will see patient in the morning.  Do not think there is an emergent need for him to go to the OR tonight due to his renal failure.  Will give more fluids overnight.  Serial lactic acid level.  Start on IV Zosyn to cover both GU and GI source.  Medically optimize him in case there is a need to go to the OR in the next 24 to 48 hours.

## 2018-04-08 ENCOUNTER — Other Ambulatory Visit: Payer: Self-pay

## 2018-04-08 DIAGNOSIS — K668 Other specified disorders of peritoneum: Secondary | ICD-10-CM | POA: Diagnosis present

## 2018-04-08 DIAGNOSIS — A0472 Enterocolitis due to Clostridium difficile, not specified as recurrent: Secondary | ICD-10-CM | POA: Diagnosis present

## 2018-04-08 DIAGNOSIS — N1330 Unspecified hydronephrosis: Secondary | ICD-10-CM | POA: Diagnosis present

## 2018-04-08 DIAGNOSIS — N401 Enlarged prostate with lower urinary tract symptoms: Secondary | ICD-10-CM | POA: Diagnosis not present

## 2018-04-08 DIAGNOSIS — I10 Essential (primary) hypertension: Secondary | ICD-10-CM

## 2018-04-08 DIAGNOSIS — N179 Acute kidney failure, unspecified: Secondary | ICD-10-CM | POA: Diagnosis not present

## 2018-04-08 DIAGNOSIS — N138 Other obstructive and reflux uropathy: Secondary | ICD-10-CM

## 2018-04-08 LAB — GLUCOSE, CAPILLARY
Glucose-Capillary: 126 mg/dL — ABNORMAL HIGH (ref 70–99)
Glucose-Capillary: 199 mg/dL — ABNORMAL HIGH (ref 70–99)
Glucose-Capillary: 283 mg/dL — ABNORMAL HIGH (ref 70–99)
Glucose-Capillary: 340 mg/dL — ABNORMAL HIGH (ref 70–99)

## 2018-04-08 LAB — URINALYSIS, ROUTINE W REFLEX MICROSCOPIC
BILIRUBIN URINE: NEGATIVE
Glucose, UA: NEGATIVE mg/dL
Ketones, ur: NEGATIVE mg/dL
Nitrite: NEGATIVE
Protein, ur: 100 mg/dL — AB
Specific Gravity, Urine: 1.006 (ref 1.005–1.030)
WBC, UA: >50 WBC/hpf — ABNORMAL HIGH (ref 0–5)
pH: 6 (ref 5.0–8.0)

## 2018-04-08 LAB — BASIC METABOLIC PANEL
Anion gap: 10 (ref 5–15)
Anion gap: 7 (ref 5–15)
BUN: 136 mg/dL — ABNORMAL HIGH (ref 8–23)
BUN: 115 mg/dL — ABNORMAL HIGH (ref 8–23)
CO2: 16 mmol/L — ABNORMAL LOW (ref 22–32)
CO2: 19 mmol/L — ABNORMAL LOW (ref 22–32)
CREATININE: 5.02 mg/dL — AB (ref 0.61–1.24)
Calcium: 7.9 mg/dL — ABNORMAL LOW (ref 8.9–10.3)
Calcium: 7.8 mg/dL — ABNORMAL LOW (ref 8.9–10.3)
Chloride: 107 mmol/L (ref 98–111)
Chloride: 108 mmol/L (ref 98–111)
Creatinine, Ser: 6.24 mg/dL — ABNORMAL HIGH (ref 0.61–1.24)
GFR calc Af Amer: 10 mL/min — ABNORMAL LOW (ref >60)
GFR calc Af Amer: 13 mL/min — ABNORMAL LOW (ref >60)
GFR calc non Af Amer: 8 mL/min — ABNORMAL LOW (ref >60)
GFR calc non Af Amer: 11 mL/min — ABNORMAL LOW (ref >60)
Glucose, Bld: 139 mg/dL — ABNORMAL HIGH (ref 70–99)
Glucose, Bld: 343 mg/dL — ABNORMAL HIGH (ref 70–99)
Potassium: 3.6 mmol/L (ref 3.5–5.1)
Potassium: 3.4 mmol/L — ABNORMAL LOW (ref 3.5–5.1)
SODIUM: 134 mmol/L — AB (ref 135–145)
Sodium: 133 mmol/L — ABNORMAL LOW (ref 135–145)

## 2018-04-08 LAB — CBC
HCT: 31.8 % — ABNORMAL LOW (ref 39.0–52.0)
Hemoglobin: 10.6 g/dL — ABNORMAL LOW (ref 13.0–17.0)
MCH: 28.9 pg (ref 26.0–34.0)
MCHC: 33.3 g/dL (ref 30.0–36.0)
MCV: 86.6 fL (ref 80.0–100.0)
Platelets: 184 10*3/uL (ref 150–400)
RBC: 3.67 MIL/uL — ABNORMAL LOW (ref 4.22–5.81)
RDW: 13.4 % (ref 11.5–15.5)
WBC: 13.9 10*3/uL — ABNORMAL HIGH (ref 4.0–10.5)
nRBC: 0 % (ref 0.0–0.2)

## 2018-04-08 LAB — C DIFFICILE QUICK SCREEN W PCR REFLEX
C DIFFICILE (CDIFF) TOXIN: POSITIVE — AB
C Diff antigen: POSITIVE — AB
C Diff interpretation: DETECTED

## 2018-04-08 MED ORDER — VANCOMYCIN 50 MG/ML ORAL SOLUTION
125.0000 mg | Freq: Four times a day (QID) | ORAL | Status: DC
  Administered 2018-04-08 – 2018-04-12 (×16): 125 mg via ORAL
  Filled 2018-04-08 (×25): qty 2.5

## 2018-04-08 MED ORDER — PHENOL 1.4 % MT LIQD
1.0000 | OROMUCOSAL | Status: DC | PRN
  Administered 2018-04-08: 1 via OROMUCOSAL
  Filled 2018-04-08: qty 177

## 2018-04-08 MED ORDER — SODIUM BICARBONATE 8.4 % IV SOLN
INTRAVENOUS | Status: DC
  Administered 2018-04-08 (×2): via INTRAVENOUS
  Filled 2018-04-08 (×6): qty 1000

## 2018-04-08 MED ORDER — ENOXAPARIN SODIUM 30 MG/0.3ML ~~LOC~~ SOLN
30.0000 mg | SUBCUTANEOUS | Status: DC
  Administered 2018-04-08 – 2018-04-09 (×2): 30 mg via SUBCUTANEOUS
  Filled 2018-04-08 (×2): qty 0.3

## 2018-04-08 MED ORDER — SODIUM CHLORIDE 0.9 % IV SOLN
1.0000 g | INTRAVENOUS | Status: DC
  Administered 2018-04-09: 1 g via INTRAVENOUS
  Filled 2018-04-08 (×3): qty 10

## 2018-04-08 MED ORDER — PIPERACILLIN-TAZOBACTAM 3.375 G IVPB
3.3750 g | Freq: Two times a day (BID) | INTRAVENOUS | Status: DC
  Administered 2018-04-08: 3.375 g via INTRAVENOUS
  Filled 2018-04-08: qty 50

## 2018-04-08 NOTE — Progress Notes (Signed)
Dr. Kerry HoughMemon made aware that patient is C. Diff positive.  Stated he will order PO vancomycin

## 2018-04-08 NOTE — Clinical Social Work Note (Signed)
Received CSW consult for SNF placement. Pt discussed in Progression today. Per MD, he spoke with family and they plan to take pt home at dc, they do not want SNF. Will clear this consult and sign off.

## 2018-04-08 NOTE — Consult Note (Signed)
Reason for Consult: Pneumoperitoneum, abdominal distention Referring Physician: Dr. Vern Claude is an 67 y.o. male.  HPI: Patient is a 67 year old black male with a history of urinary retention and mental deficiency who was brought in by his caregiver for diarrhea and abdominal distention.  He was noted on laboratory examination to have acute kidney injury with a creatinine of over 5.  He was admitted to the hospital for further evaluation treatment.  A CT scan of the abdomen was performed which revealed a small amount of pneumoperitoneum over the right hemidiaphragm as well as air in the bladder.  The etiology of the pneumoperitoneum is unknown.  Patient is nonverbal.  Caregiver does not report any nausea or vomiting.  Patient does not appear to be complaining of abdominal pain.  Past Medical History:  Diagnosis Date  . Diabetes mellitus without complication (HCC)   . History of gout   . History of kidney stones   . Hypertension   . Mentally challenged   . Urinary retention     Past Surgical History:  Procedure Laterality Date  . CYSTOSCOPY W/ URETERAL STENT PLACEMENT Bilateral 06/20/2016   Procedure: CYSTOSCOPY WITH RETROGRADE PYELOGRAM/URETERAL STENT PLACEMENT;  Surgeon: Malen Gauze, MD;  Location: [facility];  Service: Urology;  Laterality: Bilateral;  . CYSTOSCOPY WITH RETROGRADE PYELOGRAM, URETEROSCOPY AND STENT PLACEMENT Bilateral 08/20/2016   Procedure: CYSTOSCOPY WITH BILATERAL RETROGRADE PYELOGRAM, BILATERAL URETEROSCOPY,  LASER LITHOTRIPSY OF RIGHT URETERAL CALCULI, STONE BASKET EXTRACTION LEFT URETERAL CALCULI  AND BILATERAL URETERAL STENT EXCHANGE;  Surgeon: Malen Gauze, MD;  Location: [facility];  Service: Urology;  Laterality: Bilateral;  . HOLMIUM LASER APPLICATION Bilateral 08/20/2016   Procedure: HOLMIUM LASER APPLICATION;  Surgeon: Malen Gauze, MD;  Location: [facility];  Service: Urology;  Laterality: Bilateral;    Family History  Family history unknown:  Yes    Social History:  reports that he has never smoked. He has never used smokeless tobacco. He reports that he does not drink alcohol or use drugs.  Allergies: No Known Allergies  Medications: I have reviewed the patient's current medications.  Results for orders placed or performed during the hospital encounter of 04/07/18 (from the past 48 hour(s))  CBG monitoring, ED     Status: Abnormal   Collection Time: 04/07/18  3:58 PM  Result Value Ref Range   Glucose-Capillary 488 (H) 70 - 99 mg/dL  Basic metabolic panel     Status: Abnormal   Collection Time: 04/07/18  4:31 PM  Result Value Ref Range   Sodium 129 (L) 135 - 145 mmol/L   Potassium 3.7 3.5 - 5.1 mmol/L   Chloride 99 98 - 111 mmol/L   CO2 19 (L) 22 - 32 mmol/L   Glucose, Bld 503 (HH) 70 - 99 mg/dL    Comment: CRITICAL RESULT CALLED TO, READ BACK BY AND VERIFIED WITH: WININGHAM,C AT 1708 ON 12.23.2019 BY ISLEY,B    BUN 142 (H) 8 - 23 mg/dL    Comment: RESULTS CONFIRMED BY MANUAL DILUTION   Creatinine, Ser 5.85 (H) 0.61 - 1.24 mg/dL   Calcium 8.7 (L) 8.9 - 10.3 mg/dL   GFR calc non Af Amer 9 (L) >60 mL/min   GFR calc Af Amer 11 (L) >60 mL/min   Anion gap 11 5 - 15    Comment: Performed at [facility], [address] [place], [place] [address]  CBC     Status: Abnormal   Collection Time: 04/07/18  4:31 PM  Result  Value Ref Range   WBC 15.2 (H) 4.0 - 10.5 K/uL   RBC 4.36 4.22 - 5.81 MIL/uL   Hemoglobin 12.6 (L) 13.0 - 17.0 g/dL   HCT 09.8 (L) 11.9 - 14.7 %   MCV 85.6 80.0 - 100.0 fL   MCH 28.9 26.0 - 34.0 pg   MCHC 33.8 30.0 - 36.0 g/dL   RDW 82.9 56.2 - 13.0 %   Platelets 207 150 - 400 K/uL   nRBC 0.0 0.0 - 0.2 %    Comment: Performed at [facility], [address], [place], [place] [address]  Differential     Status: Abnormal   Collection Time: 04/07/18  4:31 PM  Result Value Ref Range   Neutrophils Relative % 88 %   Neutro Abs 13.3 (H) 1.7 - 7.7 K/uL   Lymphocytes Relative 4 %   Lymphs Abs 0.6 (L) 0.7 -  4.0 K/uL   Monocytes Relative 6 %   Monocytes Absolute 0.9 0.1 - 1.0 K/uL   Eosinophils Relative 0 %   Eosinophils Absolute 0.0 0.0 - 0.5 K/uL   Basophils Relative 0 %   Basophils Absolute 0.1 0.0 - 0.1 K/uL    Comment: Performed at [facility], [address], [place], [place] [address]  Lactic acid, plasma     Status: Abnormal   Collection Time: 04/07/18  7:02 PM  Result Value Ref Range   Lactic Acid, Venous 2.4 (HH) 0.5 - 1.9 mmol/L    Comment: CRITICAL RESULT CALLED TO, READ BACK BY AND VERIFIED WITH: TALBET,T AT 1936 ON 12.23.2019 BY ISLEY,B Performed at [facility], [address], [place], [place] [address]   CBG monitoring, ED     Status: Abnormal   Collection Time: 04/07/18  7:33 PM  Result Value Ref Range   Glucose-Capillary 228 (H) 70 - 99 mg/dL  Lactic acid, plasma     Status: Abnormal   Collection Time: 04/07/18  9:08 PM  Result Value Ref Range   Lactic Acid, Venous 2.1 (HH) 0.5 - 1.9 mmol/L    Comment: CRITICAL RESULT CALLED TO, READ BACK BY AND VERIFIED WITH: TETREULT,H AT 2148 ON 12.23.2019 BY ISLEY,B Performed at [facility], [address], [place], [place] [address]   Glucose, capillary     Status: None   Collection Time: 04/07/18 10:39 PM  Result Value Ref Range   Glucose-Capillary 83 70 - 99 mg/dL  Basic metabolic panel     Status: Abnormal   Collection Time: 04/08/18  5:55 AM  Result Value Ref Range   Sodium 133 (L) 135 - 145 mmol/L   Potassium 3.6 3.5 - 5.1 mmol/L   Chloride 107 98 - 111 mmol/L   CO2 16 (L) 22 - 32 mmol/L   Glucose, Bld 139 (H) 70 - 99 mg/dL   BUN 010 (H) 8 - 23 mg/dL    Comment: RESULTS CONFIRMED BY MANUAL DILUTION   Creatinine, Ser 6.24 (H) 0.61 - 1.24 mg/dL   Calcium 7.9 (L) 8.9 - 10.3 mg/dL   GFR calc non Af Amer 8 (L) >60 mL/min   GFR calc Af Amer 10 (L) >60 mL/min   Anion gap 10 5 - 15    Comment: Performed at [facility], [address] [place], [place] [address]  CBC     Status: Abnormal   Collection Time: 04/08/18   5:55 AM  Result Value Ref Range   WBC 13.9 (H) 4.0 - 10.5 K/uL   RBC 3.67 (L) 4.22 - 5.81 MIL/uL  Hemoglobin 10.6 (L) 13.0 - 17.0 g/dL   HCT 16.131.8 (L) 09.639.0 - 04.552.0 %   MCV 86.6 80.0 - 100.0 fL   MCH 28.9 26.0 - 34.0 pg   MCHC 33.3 30.0 - 36.0 g/dL   RDW 40.913.4 81.111.5 - 91.415.5 %   Platelets 184 150 - 400 K/uL   nRBC 0.0 0.0 - 0.2 %    Comment: Performed at [facility], [address] [place], [place] [address]  Glucose, capillary     Status: Abnormal   Collection Time: 04/08/18  7:32 AM  Result Value Ref Range   Glucose-Capillary 126 (H) 70 - 99 mg/dL    Ct Abdomen Pelvis Wo Contrast  Addendum Date: 04/07/2018   ADDENDUM REPORT: 04/07/2018 22:17 ADDENDUM: These results were called by telephone at the time of interpretation on 04/07/2018 at 10:15 pm to Dr. Terie PurserACHAL DAVID , who verbally acknowledged these results. Per discussion with Dr. Onalee Huaavid, the patient has not been recently catheterized. In that context, the air within the urinary bladder is more suggestive of infection. There is gaseous distention of the colon, but no findings of acute colitis. Electronically Signed   By: Deatra RobinsonKevin  Herman M.D.   On: 04/07/2018 22:17   Result Date: 04/07/2018 CLINICAL DATA:  Diarrhea EXAM: CT ABDOMEN AND PELVIS WITHOUT CONTRAST TECHNIQUE: Multidetector CT imaging of the abdomen and pelvis was performed following the standard protocol without IV contrast. COMPARISON:  CT abdomen pelvis 06/19/2016 FINDINGS: LOWER CHEST: Bibasilar atelectasis. HEPATOBILIARY: Left lobe of the liver is absent. Numerous gallstones. PANCREAS: The pancreatic parenchymal contours are normal and there is no ductal dilatation. There is no peripancreatic fluid collection. SPLEEN: Normal. ADRENALS/URINARY TRACT: --Adrenal glands: Normal. --Right kidney/ureter: Severe hydroureteronephrosis. No ureteral stone. --Left kidney/ureter: Severe hydroureteronephrosis. Layering stones dependently within the renal collecting system and renal pelvis. No  ureteral stone. --Urinary bladder: The urinary bladder is severely dilated with multifocal anti-dependent gas. STOMACH/BOWEL: There are multiple foci of free air within the abdomen, most evident beneath the right hemidiaphragm. VASCULAR/LYMPHATIC: Normal course and caliber of the major abdominal vessels. No abdominal or pelvic lymphadenopathy. REPRODUCTIVE: Surgical clips adjacent to the prostate. MUSCULOSKELETAL. Severe L4-5 spinal canal stenosis, unchanged. OTHER: None. IMPRESSION: 1. Small volume pneumoperitoneum, most evident beneath the right hemidiaphragm, suggesting bowel perforation. No identifiable source. 2. Severely dilated urinary bladder with severe bilateral hydroureteronephrosis. Multiple left renal calculi, but no obstructing stones. 3. Gas within the urinary bladder is probably secondary to catheterization. 4. Severe L4-5 spinal canal stenosis. Electronically Signed: By: Deatra RobinsonKevin  Herman M.D. On: 04/07/2018 22:09   Dg Abd Acute W/chest  Result Date: 04/07/2018 CLINICAL DATA:  Patient has not eaten days. Diarrhea and weakness. EXAM: DG ABDOMEN ACUTE W/ 1V CHEST COMPARISON:  CT 06/19/2016 FINDINGS: Low lung volumes are identified on the frontal view of the chest with top normal size cardiac silhouette. Nonaneurysmal appearing thoracic aorta is noted. There is atelectasis at the right base. No overt pulmonary edema, pleural effusion or pneumothorax. No acute pulmonary consolidation. There is no free air beneath the diaphragm. Numerous renal calculi project over the left hemiabdomen, some possibly in the expected location of the renal pelvis. Mild diffuse gaseous distention of large bowel is noted without definite evidence of mechanical bowel obstruction. Surgical clips project over the lower pelvis with stable phlebolith in the left hemipelvis. Small amount of retained stool is seen in the rectum. Thoracolumbar spondylosis without acute osseous abnormality. IMPRESSION: 1. Low lung volumes with right  basilar atelectasis. 2. Mild diffuse gaseous distention of  large bowel without definite evidence of mechanical bowel obstruction. There is no free air. 3. Left-sided nephrolithiasis, some potentially within the expected location of the renal pelvis. Electronically Signed   By: Tollie Ethavid  Kwon M.D.   On: 04/07/2018 17:25    ROS:  Review of systems not obtained due to patient factors.  Blood pressure (!) 127/56, pulse 93, temperature 99 F (37.2 C), temperature source Oral, resp. rate 19, height 6' (1.829 m), weight 79.2 kg, SpO2 95 %. Physical Exam: White male no acute distress Head is normocephalic, atraumatic Lungs clear to auscultation with equal breath sounds bilaterally Heart examination reveals a regular rate and rhythm without S3, S4, murmurs Abdomen is distended but soft.  Firmness is noted in the suprapubic region.  No rigidity is noted.  No surgical scars are present.  No hernias are present.  CT scan images personally reviewed  Assessment/Plan: Impression: Trace pneumoperitoneum of unknown etiology.  Patient also has some air in the bladder which is significantly distended.  A condom catheter was used overnight but he continues to pull the soft.  There is a question of whether the air came from the bladder.  He has significant bladder outlet obstruction. Plan: Will have Foley placed.  No need for acute surgical intervention at this time.  May have clears.    Franky MachoMark Hadasa Gasner 04/08/2018, 8:15 AM

## 2018-04-08 NOTE — Progress Notes (Addendum)
PROGRESS NOTE    [name]  ZOX:[id number] DOB: 08-03-1950 DOA: 04/07/2018 PCP: Pearson Grippe, MD    Brief Narrative:  67 year old male with a history of cognitive deficits, diabetes, hypertension, presents to the hospital with elevated blood sugars, diarrhea and dehydration.  Found to have acute kidney injury related to volume depletion from diarrhea.  He is also had distended bladder, bilateral hydronephrosis consistent with an obstructive process.  Foley catheter placed with expulsion of 2500 cc of urine.  Creatinine is trending down.  He also complained of diarrhea and tested positive for C. difficile.  Currently on vancomycin.   Assessment & Plan:   Principal Problem:   AKI (acute kidney injury) (HCC) Active Problems:   Urinary retention   Intellectual disability   HTN (hypertension)   Diabetes mellitus type 2, controlled (HCC)   BPH (benign prostatic hyperplasia)   Diarrhea   Nonverbal   Pneumoperitoneum of unknown etiology   C. difficile diarrhea   Bilateral hydronephrosis   1. Acute kidney injury.  Likely a combination of prerenal from GI losses as well as postobstructive from urinary retention.  Foley catheter is been placed.  He is having large amounts of urine output.  He immediately had 2500 cc of urine out after catheter was placed.  CT scan was reviewed with urology, Dr. Mena Goes who recommended continued bladder decompression with Foley catheter as well as covering for urinary tract infection with Rocephin.  Urine culture is in process.  Continue to follow serial renal function. 2. Urinary retention.  Etiology is unclear.  Renal ultrasound shows bilateral hydronephrosis.  Family reports that he had similar episode in the past and they performed in and out catheterizations at home.  Will likely need to do the same on discharge.  Follow-up with urology as an outpatient. 3. C. difficile diarrhea.  Patient tested positive for C. difficile.  He is currently been started on  vancomycin. 4. Hypertension.  ACE inhibitor currently on hold. 5. Diabetes.  Hold metformin.  Continue on sliding scale insulin.  Blood sugar stable. 6. Pneumoperitoneum of unknown etiology.  General surgery following.  No clear etiology yet.  No indication for surgery at this point.  Continue to follow.   DVT prophylaxis: Lovenox Code Status: Full code Family Communication: Discussed with family at the bedside Disposition Plan: Discharge home once improved   Consultants:   General surgery  Procedures:     Antimicrobials:   Vancomycin 12/24 >  Ceftriaxone 12/24>   Subjective: Does not answer question.  Family reports that he was having diarrhea yesterday.  No vomiting.  Objective: Vitals:   04/07/18 2016 04/07/18 2200 04/08/18 0541 04/08/18 1520  BP: (!) 148/67  (!) 127/56 115/60  Pulse: 85  93 95  Resp: 18  19 18   Temp: 98.4 F (36.9 C)  99 F (37.2 C) 98.8 F (37.1 C)  TempSrc: Oral  Oral Oral  SpO2: 98%  95% 99%  Weight:   79.2 kg   Height:  6' (1.829 m)      Intake/Output Summary (Last 24 hours) at 04/08/2018 1958 Last data filed at 04/08/2018 1947 Gross per 24 hour  Intake 11758.86 ml  Output 275 ml  Net 11483.86 ml   Filed Weights   04/07/18 1556 04/08/18 0541  Weight: 74.8 kg 79.2 kg    Examination:  General exam: Appears calm and comfortable  Respiratory system: Clear to auscultation. Respiratory effort normal. Cardiovascular system: S1 & S2 heard, RRR. No JVD, murmurs, rubs, gallops or clicks.  No pedal edema. Gastrointestinal system: Abdomen is nondistended, soft and nontender. No organomegaly or masses felt. Normal bowel sounds heard. Central nervous system: . No focal neurological deficits. Extremities: Symmetric 5 x 5 power. Skin: No rashes, lesions or ulcers Psychiatry: does not answer questions.     Data Reviewed: I have personally reviewed following labs and imaging studies  CBC: Recent Labs  Lab 04/07/18 1631 04/08/18 0555    WBC 15.2* 13.9*  NEUTROABS 13.3*  --   HGB 12.6* 10.6*  HCT 37.3* 31.8*  MCV 85.6 86.6  PLT 207 184   Basic Metabolic Panel: Recent Labs  Lab 04/07/18 1631 04/08/18 0555 04/08/18 1512  NA 129* 133* 134*  K 3.7 3.6 3.4*  CL 99 107 108  CO2 19* 16* 19*  GLUCOSE 503* 139* 343*  BUN 142* 136* 115*  CREATININE 5.85* 6.24* 5.02*  CALCIUM 8.7* 7.9* 7.8*   GFR: Estimated Creatinine Clearance: 15.5 mL/min (A) (by C-G formula based on SCr of 5.02 mg/dL (H)). Liver Function Tests: No results for input(s): AST, ALT, ALKPHOS, BILITOT, PROT, ALBUMIN in the last 168 hours. No results for input(s): LIPASE, AMYLASE in the last 168 hours. No results for input(s): AMMONIA in the last 168 hours. Coagulation Profile: No results for input(s): INR, PROTIME in the last 168 hours. Cardiac Enzymes: No results for input(s): CKTOTAL, CKMB, CKMBINDEX, TROPONINI in the last 168 hours. BNP (last 3 results) No results for input(s): PROBNP in the last 8760 hours. HbA1C: No results for input(s): HGBA1C in the last 72 hours. CBG: Recent Labs  Lab 04/07/18 1933 04/07/18 2239 04/08/18 0732 04/08/18 1106 04/08/18 1647  GLUCAP 228* 83 126* 199* 283*   Lipid Profile: No results for input(s): CHOL, HDL, LDLCALC, TRIG, CHOLHDL, LDLDIRECT in the last 72 hours. Thyroid Function Tests: No results for input(s): TSH, T4TOTAL, FREET4, T3FREE, THYROIDAB in the last 72 hours. Anemia Panel: No results for input(s): VITAMINB12, FOLATE, FERRITIN, TIBC, IRON, RETICCTPCT in the last 72 hours. Sepsis Labs: Recent Labs  Lab 04/07/18 1902 04/07/18 2108  LATICACIDVEN 2.4* 2.1*    Recent Results (from the past 240 hour(s))  C difficile quick scan w PCR reflex     Status: Abnormal   Collection Time: 04/08/18 11:10 AM  Result Value Ref Range Status   C Diff antigen POSITIVE (A) NEGATIVE Final   C Diff toxin POSITIVE (A) NEGATIVE Final    Comment: CRITICAL RESULT CALLED TO, READ BACK BY AND VERIFIED  WITH: MARTIN,D. AT 1254 ON 04/08/2018 BY BAUGHAM,M.    C Diff interpretation Toxin producing C. difficile detected.  Final    Comment: CRITICAL RESULT CALLED TO, READ BACK BY AND VERIFIED WITH: MARTIN,D. AT 1254 ON 04/08/2018 BY Ginette PitmanBAUGHAM,M. Performed at [facility], [address] [place], [place] [address]          Radiology Studies: Ct Abdomen Pelvis Wo Contrast  Addendum Date: 04/07/2018   ADDENDUM REPORT: 04/07/2018 22:17 ADDENDUM: These results were called by telephone at the time of interpretation on 04/07/2018 at 10:15 pm to Dr. Terie PurserACHAL DAVID , who verbally acknowledged these results. Per discussion with Dr. Onalee Huaavid, the patient has not been recently catheterized. In that context, the air within the urinary bladder is more suggestive of infection. There is gaseous distention of the colon, but no findings of acute colitis. Electronically Signed   By: Deatra RobinsonKevin  Herman M.D.   On: 04/07/2018 22:17   Result Date: 04/07/2018 CLINICAL DATA:  Diarrhea EXAM: CT ABDOMEN AND PELVIS WITHOUT CONTRAST TECHNIQUE: Multidetector CT  imaging of the abdomen and pelvis was performed following the standard protocol without IV contrast. COMPARISON:  CT abdomen pelvis 06/19/2016 FINDINGS: LOWER CHEST: Bibasilar atelectasis. HEPATOBILIARY: Left lobe of the liver is absent. Numerous gallstones. PANCREAS: The pancreatic parenchymal contours are normal and there is no ductal dilatation. There is no peripancreatic fluid collection. SPLEEN: Normal. ADRENALS/URINARY TRACT: --Adrenal glands: Normal. --Right kidney/ureter: Severe hydroureteronephrosis. No ureteral stone. --Left kidney/ureter: Severe hydroureteronephrosis. Layering stones dependently within the renal collecting system and renal pelvis. No ureteral stone. --Urinary bladder: The urinary bladder is severely dilated with multifocal anti-dependent gas. STOMACH/BOWEL: There are multiple foci of free air within the abdomen, most evident beneath the right  hemidiaphragm. VASCULAR/LYMPHATIC: Normal course and caliber of the major abdominal vessels. No abdominal or pelvic lymphadenopathy. REPRODUCTIVE: Surgical clips adjacent to the prostate. MUSCULOSKELETAL. Severe L4-5 spinal canal stenosis, unchanged. OTHER: None. IMPRESSION: 1. Small volume pneumoperitoneum, most evident beneath the right hemidiaphragm, suggesting bowel perforation. No identifiable source. 2. Severely dilated urinary bladder with severe bilateral hydroureteronephrosis. Multiple left renal calculi, but no obstructing stones. 3. Gas within the urinary bladder is probably secondary to catheterization. 4. Severe L4-5 spinal canal stenosis. Electronically Signed: By: Deatra RobinsonKevin  Herman M.D. On: 04/07/2018 22:09   Dg Abd Acute W/chest  Result Date: 04/07/2018 CLINICAL DATA:  Patient has not eaten days. Diarrhea and weakness. EXAM: DG ABDOMEN ACUTE W/ 1V CHEST COMPARISON:  CT 06/19/2016 FINDINGS: Low lung volumes are identified on the frontal view of the chest with top normal size cardiac silhouette. Nonaneurysmal appearing thoracic aorta is noted. There is atelectasis at the right base. No overt pulmonary edema, pleural effusion or pneumothorax. No acute pulmonary consolidation. There is no free air beneath the diaphragm. Numerous renal calculi project over the left hemiabdomen, some possibly in the expected location of the renal pelvis. Mild diffuse gaseous distention of large bowel is noted without definite evidence of mechanical bowel obstruction. Surgical clips project over the lower pelvis with stable phlebolith in the left hemipelvis. Small amount of retained stool is seen in the rectum. Thoracolumbar spondylosis without acute osseous abnormality. IMPRESSION: 1. Low lung volumes with right basilar atelectasis. 2. Mild diffuse gaseous distention of large bowel without definite evidence of mechanical bowel obstruction. There is no free air. 3. Left-sided nephrolithiasis, some potentially within the  expected location of the renal pelvis. Electronically Signed   By: Tollie Ethavid  Kwon M.D.   On: 04/07/2018 17:25        Scheduled Meds: . enoxaparin (LOVENOX) injection  30 mg Subcutaneous Q24H  . insulin aspart  0-9 Units Subcutaneous TID WC  . vancomycin  125 mg Oral QID   Continuous Infusions: . dextrose 5 % 1,000 mL with potassium chloride 20 mEq, sodium bicarbonate 150 mEq infusion 125 mL/hr at 04/08/18 1109  . piperacillin-tazobactam (ZOSYN)  IV Stopped (04/08/18 1421)     LOS: 1 day    Time spent: 35mins    Erick BlinksJehanzeb Meeah Totino, MD [facility] Pager [phone]  If 7PM-7AM, please contact night-coverage [email or web address] Password [email or web address] 04/08/2018, 7:58 PM

## 2018-04-08 NOTE — Care Management Note (Signed)
Case Management Note  Patient Details  Name: [name] MRN: [id number] Date of Birth: Aug 03, 1950  Subjective/Objective:   Admitted with AKI. Pt from home, lives with brother, has strong family support. Sister at bedside today. Family wishes for pt to return home at DC. Discussed HH nursing for hospital f/u. Sister at bedside does not remember name of previous [facility] provider. CMS listed given. CM asked sister to discuss with other caregivers. Per chart review pt has used [facility] in the past.                  Action/Plan: DC anticipated in next 2-3 days. CM will follow up with [facility]   Expected Discharge Date:      04/11/18            Expected Discharge Plan:  Home w Home Health Services  In-House Referral:  NA  Discharge planning Services  CM Consult  Post Acute Care Choice:  Home Health Choice offered to:  Sibling  Status of Service:  In process, will continue to follow  If discussed at Long Length of Stay Meetings, dates discussed:    Additional Comments:  Malcolm MetroChildress, Lanice Folden Demske, RN 04/08/2018, 11:40 AM

## 2018-04-09 DIAGNOSIS — K668 Other specified disorders of peritoneum: Secondary | ICD-10-CM | POA: Diagnosis not present

## 2018-04-09 DIAGNOSIS — A0472 Enterocolitis due to Clostridium difficile, not specified as recurrent: Secondary | ICD-10-CM | POA: Diagnosis not present

## 2018-04-09 DIAGNOSIS — N1330 Unspecified hydronephrosis: Secondary | ICD-10-CM | POA: Diagnosis not present

## 2018-04-09 DIAGNOSIS — N401 Enlarged prostate with lower urinary tract symptoms: Secondary | ICD-10-CM | POA: Diagnosis not present

## 2018-04-09 DIAGNOSIS — L8990 Pressure ulcer of unspecified site, unspecified stage: Secondary | ICD-10-CM

## 2018-04-09 DIAGNOSIS — N179 Acute kidney failure, unspecified: Secondary | ICD-10-CM | POA: Diagnosis not present

## 2018-04-09 LAB — BASIC METABOLIC PANEL
Anion gap: 8 (ref 5–15)
BUN: 85 mg/dL — AB (ref 8–23)
CO2: 28 mmol/L (ref 22–32)
Calcium: 8.2 mg/dL — ABNORMAL LOW (ref 8.9–10.3)
Chloride: 108 mmol/L (ref 98–111)
Creatinine, Ser: 3.06 mg/dL — ABNORMAL HIGH (ref 0.61–1.24)
GFR calc Af Amer: 23 mL/min — ABNORMAL LOW (ref >60)
GFR calc non Af Amer: 20 mL/min — ABNORMAL LOW (ref >60)
Glucose, Bld: 430 mg/dL — ABNORMAL HIGH (ref 70–99)
POTASSIUM: 3.4 mmol/L — AB (ref 3.5–5.1)
Sodium: 144 mmol/L (ref 135–145)

## 2018-04-09 LAB — GASTROINTESTINAL PANEL BY PCR, STOOL (REPLACES STOOL CULTURE)

## 2018-04-09 LAB — GLUCOSE, CAPILLARY
Glucose-Capillary: 391 mg/dL — ABNORMAL HIGH (ref 70–99)
Glucose-Capillary: 400 mg/dL — ABNORMAL HIGH (ref 70–99)
Glucose-Capillary: 157 mg/dL — ABNORMAL HIGH (ref 70–99)
Glucose-Capillary: 240 mg/dL — ABNORMAL HIGH (ref 70–99)

## 2018-04-09 LAB — CBC
HEMATOCRIT: 32.8 % — AB (ref 39.0–52.0)
HEMOGLOBIN: 11 g/dL — AB (ref 13.0–17.0)
MCH: 28.6 pg (ref 26.0–34.0)
MCHC: 33.5 g/dL (ref 30.0–36.0)
MCV: 85.4 fL (ref 80.0–100.0)
Platelets: 210 10*3/uL (ref 150–400)
RBC: 3.84 MIL/uL — ABNORMAL LOW (ref 4.22–5.81)
RDW: 13.1 % (ref 11.5–15.5)
WBC: 12.5 10*3/uL — ABNORMAL HIGH (ref 4.0–10.5)
nRBC: 0 % (ref 0.0–0.2)

## 2018-04-09 LAB — URINE CULTURE

## 2018-04-09 MED ORDER — SODIUM CHLORIDE 0.45 % IV SOLN: INTRAVENOUS | Status: DC

## 2018-04-09 MED ORDER — TAMSULOSIN HCL 0.4 MG PO CAPS
0.4000 mg | ORAL_CAPSULE | Freq: Every day | ORAL | Status: DC
  Administered 2018-04-09 – 2018-04-11 (×3): 0.4 mg via ORAL
  Filled 2018-04-09 (×2): qty 1

## 2018-04-09 MED ORDER — SACCHAROMYCES BOULARDII 250 MG PO CAPS
250.0000 mg | ORAL_CAPSULE | Freq: Two times a day (BID) | ORAL | Status: DC
  Administered 2018-04-09 – 2018-04-12 (×6): 250 mg via ORAL
  Filled 2018-04-09 (×6): qty 1

## 2018-04-09 MED ORDER — INSULIN GLARGINE 100 UNIT/ML ~~LOC~~ SOLN
20.0000 [IU] | Freq: Every day | SUBCUTANEOUS | Status: DC
  Administered 2018-04-09 – 2018-04-12 (×4): 20 [IU] via SUBCUTANEOUS
  Filled 2018-04-09 (×6): qty 0.2

## 2018-04-09 MED ORDER — POTASSIUM CHLORIDE IN NACL 20-0.45 MEQ/L-% IV SOLN
INTRAVENOUS | Status: AC
  Administered 2018-04-09 – 2018-04-10 (×3): via INTRAVENOUS
  Filled 2018-04-09 (×7): qty 1000

## 2018-04-09 NOTE — Progress Notes (Signed)
Subjective: Patient noncommunicative.  No apparent distress  Objective: Vital signs in last 24 hours: Temp:  [97.9 F (36.6 C)-98.8 F (37.1 C)] 98.6 F (37 C) (12/25 0540) Pulse Rate:  [92-96] 92 (12/25 0540) Resp:  [18] 18 (12/25 0540) BP: (112-122)/(50-60) 122/51 (12/25 0540) SpO2:  [97 %-99 %] 97 % (12/25 0540) Weight:  [77.2 kg] 77.2 kg (12/25 0540) Last BM Date: 04/08/18  Intake/Output from previous day: 12/24 0701 - 12/25 0700 In: 11210.8 [I.V.:1980.3; IV Piggyback:130.5] Out: 4675 [Urine:4675] Intake/Output this shift: No intake/output data recorded.  General appearance: alert and no distress GI: soft, non-tender; bowel sounds normal; no masses,  no organomegaly No rigidity noted Lab Results:  Recent Labs    04/08/18 0555 04/09/18 0533  WBC 13.9* 12.5*  HGB 10.6* 11.0*  HCT 31.8* 32.8*  PLT 184 210   BMET Recent Labs    04/08/18 1512 04/09/18 0533  NA 134* 144  K 3.4* 3.4*  CL 108 108  CO2 19* 28  GLUCOSE 343* 430*  BUN 115* 85*  CREATININE 5.02* 3.06*  CALCIUM 7.8* 8.2*   PT/INR No results for input(s): LABPROT, INR in the last 72 hours.  Studies/Results: Ct Abdomen Pelvis Wo Contrast  Addendum Date: 04/07/2018   ADDENDUM REPORT: 04/07/2018 22:17 ADDENDUM: These results were called by telephone at the time of interpretation on 04/07/2018 at 10:15 pm to Dr. Terie PurserACHAL [name] , who verbally acknowledged these results. Per discussion with Dr. Onalee Huaavid, the patient has not been recently catheterized. In that context, the air within the urinary bladder is more suggestive of infection. There is gaseous distention of the colon, but no findings of acute colitis. Electronically Signed   By: Deatra RobinsonKevin  Herman M.D.   On: 04/07/2018 22:17   Result Date: 04/07/2018 CLINICAL DATA:  Diarrhea EXAM: CT ABDOMEN AND PELVIS WITHOUT CONTRAST TECHNIQUE: Multidetector CT imaging of the abdomen and pelvis was performed following the standard protocol without IV contrast.  COMPARISON:  CT abdomen pelvis 06/19/2016 FINDINGS: LOWER CHEST: Bibasilar atelectasis. HEPATOBILIARY: Left lobe of the liver is absent. Numerous gallstones. PANCREAS: The pancreatic parenchymal contours are normal and there is no ductal dilatation. There is no peripancreatic fluid collection. SPLEEN: Normal. ADRENALS/URINARY TRACT: --Adrenal glands: Normal. --Right kidney/ureter: Severe hydroureteronephrosis. No ureteral stone. --Left kidney/ureter: Severe hydroureteronephrosis. Layering stones dependently within the renal collecting system and renal pelvis. No ureteral stone. --Urinary bladder: The urinary bladder is severely dilated with multifocal anti-dependent gas. STOMACH/BOWEL: There are multiple foci of free air within the abdomen, most evident beneath the right hemidiaphragm. VASCULAR/LYMPHATIC: Normal course and caliber of the major abdominal vessels. No abdominal or pelvic lymphadenopathy. REPRODUCTIVE: Surgical clips adjacent to the prostate. MUSCULOSKELETAL. Severe L4-5 spinal canal stenosis, unchanged. OTHER: None. IMPRESSION: 1. Small volume pneumoperitoneum, most evident beneath the right hemidiaphragm, suggesting bowel perforation. No identifiable source. 2. Severely dilated urinary bladder with severe bilateral hydroureteronephrosis. Multiple left renal calculi, but no obstructing stones. 3. Gas within the urinary bladder is probably secondary to catheterization. 4. Severe L4-5 spinal canal stenosis. Electronically Signed: By: Deatra RobinsonKevin  Herman M.D. On: 04/07/2018 22:09   Dg Abd Acute W/chest  Result Date: 04/07/2018 CLINICAL DATA:  Patient has not eaten days. Diarrhea and weakness. EXAM: DG ABDOMEN ACUTE W/ 1V CHEST COMPARISON:  CT 06/19/2016 FINDINGS: Low lung volumes are identified on the frontal view of the chest with top normal size cardiac silhouette. Nonaneurysmal appearing thoracic aorta is noted. There is atelectasis at the right base. No overt pulmonary edema, pleural effusion or  pneumothorax. No  acute pulmonary consolidation. There is no free air beneath the diaphragm. Numerous renal calculi project over the left hemiabdomen, some possibly in the expected location of the renal pelvis. Mild diffuse gaseous distention of large bowel is noted without definite evidence of mechanical bowel obstruction. Surgical clips project over the lower pelvis with stable phlebolith in the left hemipelvis. Small amount of retained stool is seen in the rectum. Thoracolumbar spondylosis without acute osseous abnormality. IMPRESSION: 1. Low lung volumes with right basilar atelectasis. 2. Mild diffuse gaseous distention of large bowel without definite evidence of mechanical bowel obstruction. There is no free air. 3. Left-sided nephrolithiasis, some potentially within the expected location of the renal pelvis. Electronically Signed   By: Tollie Ethavid  Kwon M.D.   On: 04/07/2018 17:25    Anti-infectives: Anti-infectives (From admission, onward)   Start     Dose/Rate Route Frequency Ordered Stop   04/08/18 2015  cefTRIAXone (ROCEPHIN) 1 g in sodium chloride 0.9 % 100 mL IVPB     1 g 200 mL/hr over 30 Minutes Intravenous Every 24 hours 04/08/18 2001     04/08/18 1400  vancomycin (VANCOCIN) 50 mg/mL oral solution 125 mg     125 mg Oral 4 times daily 04/08/18 1257 04/18/18 1359   04/08/18 1000  piperacillin-tazobactam (ZOSYN) IVPB 3.375 g  Status:  Discontinued     3.375 g 12.5 mL/hr over 240 Minutes Intravenous Every 12 hours 04/08/18 0740 04/08/18 2001   04/07/18 2245  piperacillin-tazobactam (ZOSYN) IVPB 3.375 g     3.375 g 12.5 mL/hr over 240 Minutes Intravenous  Once 04/07/18 2232 04/08/18 [id number]      Assessment/Plan: Impression: C. difficile positive, urinary retention.  Foley has decompressed bladder.  Patient tolerating clear liquid diet.  Suspect trace pneumoperitoneum secondary to C. difficile colitis.  No evidence of surgical abdomen at this time. Plan: Advance diet.  Will follow peripherally  with you.  LOS: 2 days    [name] 04/09/2018

## 2018-04-09 NOTE — Progress Notes (Signed)
PROGRESS NOTE    [name]  ZOX:[id number] DOB: February 14, 1951 DOA: 04/07/2018 PCP: Pearson Grippe, MD    Brief Narrative:  67 year old male with a history of cognitive deficits, diabetes, hypertension, presents to the hospital with elevated blood sugars, diarrhea and dehydration.  Found to have acute kidney injury related to volume depletion from diarrhea.  He is also had distended bladder, bilateral hydronephrosis consistent with an obstructive process.  Foley catheter placed with expulsion of 2500 cc of urine.  Creatinine is trending down.  He also complained of diarrhea and tested positive for C. difficile.  Currently on vancomycin.   Assessment & Plan:   Principal Problem:   AKI (acute kidney injury) (HCC) Active Problems:   Urinary retention   Intellectual disability   HTN (hypertension)   Diabetes mellitus type 2, controlled (HCC)   BPH (benign prostatic hyperplasia)   Diarrhea   Nonverbal   Pneumoperitoneum of unknown etiology   C. difficile diarrhea   Bilateral hydronephrosis   Pressure injury of skin   1. Acute kidney injury.  Likely a combination of prerenal from GI losses as well as postobstructive from urinary retention.  Foley catheter has been placed.  He is having large amounts of urine output.  He immediately had 2500 cc of urine out after catheter was placed.  CT scan was reviewed with urology, Dr. Mena Goes who recommended continued bladder decompression with Foley catheter as well as covering for urinary tract infection with Rocephin.  Urine culture did not show any specific growth.  Will discontinue further antibiotics.  Continue to follow serial renal function.  Continue IV fluids and electrolyte replacement to compensate for postobstructive diuresis. 2. Urinary retention.  Etiology is unclear.  Renal ultrasound shows bilateral hydronephrosis.  Family reports that he had similar episode in the past and they performed in and out catheterizations at home.  Will likely  need to do the same on discharge.  Follow-up with urology as an outpatient.  Bladder has been decompressed with Foley catheter. 3. C. difficile diarrhea.  Patient tested positive for C. difficile.  He is currently been started on vancomycin.  Continues to have diarrhea.  Add probiotics 4. Hypertension.  ACE inhibitor currently on hold. 5. Diabetes.  Hold metformin.  Continue on sliding scale insulin.  Blood sugars are uncontrolled.  Will start on Lantus.  Check A1c. 6. Pneumoperitoneum of unknown etiology.  General surgery following.  Felt to be secondary to C. difficile colitis.  No indication for surgery at this point.  Continue to follow.   DVT prophylaxis: Lovenox Code Status: Full code Family Communication: Discussed with family at the bedside Disposition Plan: Discharge home once improved   Consultants:   General surgery  Procedures:     Antimicrobials:   Vancomycin po 12/24 >  Ceftriaxone 12/24>12/25   Subjective: Family reports that he has had loose stools yesterday and this morning.  He has had good urine output since catheter in place.  Objective: Vitals:   04/08/18 0541 04/08/18 1520 04/08/18 2233 04/09/18 0540  BP: (!) 127/56 115/60 (!) 112/50 (!) 122/51  Pulse: 93 95 96 92  Resp: 19 18 18 18   Temp: 99 F (37.2 C) 98.8 F (37.1 C) 97.9 F (36.6 C) 98.6 F (37 C)  TempSrc: Oral Oral Oral Oral  SpO2: 95% 99% 98% 97%  Weight: 79.2 kg   77.2 kg  Height:        Intake/Output Summary (Last 24 hours) at 04/09/2018 1421 Last data filed at 04/09/2018 1136  Gross per 24 hour  Intake 8830.79 ml  Output 6325 ml  Net 2505.79 ml   Filed Weights   04/07/18 1556 04/08/18 0541 04/09/18 0540  Weight: 74.8 kg 79.2 kg 77.2 kg    Examination:  General exam: Awake, does not answer questions Respiratory system: Clear to auscultation. Respiratory effort normal. Cardiovascular system:RRR. No murmurs, rubs, gallops. Gastrointestinal system: Abdomen is distended, soft  and nontender. No organomegaly or masses felt. Normal bowel sounds heard. Central nervous system:  No focal neurological deficits. Extremities: No C/C/E, +pedal pulses Skin: No rashes, lesions or ulcers Psychiatry: nonverbal, flat affect   Data Reviewed: I have personally reviewed following labs and imaging studies  CBC: Recent Labs  Lab 04/07/18 1631 04/08/18 0555 04/09/18 0533  WBC 15.2* 13.9* 12.5*  NEUTROABS 13.3*  --   --   HGB 12.6* 10.6* 11.0*  HCT 37.3* 31.8* 32.8*  MCV 85.6 86.6 85.4  PLT 207 184 210   Basic Metabolic Panel: Recent Labs  Lab 04/07/18 1631 04/08/18 0555 04/08/18 1512 04/09/18 0533  NA 129* 133* 134* 144  K 3.7 3.6 3.4* 3.4*  CL 99 107 108 108  CO2 19* 16* 19* 28  GLUCOSE 503* 139* 343* 430*  BUN 142* 136* 115* 85*  CREATININE 5.85* 6.24* 5.02* 3.06*  CALCIUM 8.7* 7.9* 7.8* 8.2*   GFR: Estimated Creatinine Clearance: 25.2 mL/min (A) (by C-G formula based on SCr of 3.06 mg/dL (H)). Liver Function Tests: No results for input(s): AST, ALT, ALKPHOS, BILITOT, PROT, ALBUMIN in the last 168 hours. No results for input(s): LIPASE, AMYLASE in the last 168 hours. No results for input(s): AMMONIA in the last 168 hours. Coagulation Profile: No results for input(s): INR, PROTIME in the last 168 hours. Cardiac Enzymes: No results for input(s): CKTOTAL, CKMB, CKMBINDEX, TROPONINI in the last 168 hours. BNP (last 3 results) No results for input(s): PROBNP in the last 8760 hours. HbA1C: No results for input(s): HGBA1C in the last 72 hours. CBG: Recent Labs  Lab 04/08/18 1106 04/08/18 1647 04/08/18 2232 04/09/18 0743 04/09/18 1132  GLUCAP 199* 283* 340* 391* 400*   Lipid Profile: No results for input(s): CHOL, HDL, LDLCALC, TRIG, CHOLHDL, LDLDIRECT in the last 72 hours. Thyroid Function Tests: No results for input(s): TSH, T4TOTAL, FREET4, T3FREE, THYROIDAB in the last 72 hours. Anemia Panel: No results for input(s): VITAMINB12, FOLATE,  FERRITIN, TIBC, IRON, RETICCTPCT in the last 72 hours. Sepsis Labs: Recent Labs  Lab 04/07/18 1902 04/07/18 2108  LATICACIDVEN 2.4* 2.1*    Recent Results (from the past 240 hour(s))  Culture, Urine     Status: None   Collection Time: 04/08/18 10:50 AM  Result Value Ref Range Status   Specimen Description   Final    URINE, CATHETERIZED Performed at [facility], [address] [place], [place] [address]    Special Requests   Final    NONE Performed at [facility], [address], [place], [place] [address]    Culture   Final    Multiple bacterial morphotypes present, none predominant. Suggest appropriate recollection if clinically indicated.   Report Status 04/09/2018 FINAL  Final  C difficile quick scan w PCR reflex     Status: Abnormal   Collection Time: 04/08/18 11:10 AM  Result Value Ref Range Status   C Diff antigen POSITIVE (A) NEGATIVE Final   C Diff toxin POSITIVE (A) NEGATIVE Final    Comment: CRITICAL RESULT CALLED TO, READ BACK BY AND VERIFIED WITH: MARTIN,D. AT 1254 ON 04/08/2018  BY BAUGHAM,M.    C Diff interpretation Toxin producing C. difficile detected.  Final    Comment: CRITICAL RESULT CALLED TO, READ BACK BY AND VERIFIED WITH: MARTIN,D. AT 1254 ON 04/08/2018 BY Ginette PitmanBAUGHAM,M. Performed at [facility], [address] [place], [place] [id number]          Radiology Studies: Ct Abdomen Pelvis Wo Contrast  Addendum Date: 04/07/2018   ADDENDUM REPORT: 04/07/2018 22:17 ADDENDUM: These results were called by telephone at the time of interpretation on 04/07/2018 at 10:15 pm to Dr. Terie PurserACHAL DAVID , who verbally acknowledged these results. Per discussion with Dr. Onalee Huaavid, the patient has not been recently catheterized. In that context, the air within the urinary bladder is more suggestive of infection. There is gaseous distention of the colon, but no findings of acute colitis. Electronically Signed   By: Deatra RobinsonKevin  Herman M.D.   On: 04/07/2018 22:17   Result Date:  04/07/2018 CLINICAL DATA:  Diarrhea EXAM: CT ABDOMEN AND PELVIS WITHOUT CONTRAST TECHNIQUE: Multidetector CT imaging of the abdomen and pelvis was performed following the standard protocol without IV contrast. COMPARISON:  CT abdomen pelvis 06/19/2016 FINDINGS: LOWER CHEST: Bibasilar atelectasis. HEPATOBILIARY: Left lobe of the liver is absent. Numerous gallstones. PANCREAS: The pancreatic parenchymal contours are normal and there is no ductal dilatation. There is no peripancreatic fluid collection. SPLEEN: Normal. ADRENALS/URINARY TRACT: --Adrenal glands: Normal. --Right kidney/ureter: Severe hydroureteronephrosis. No ureteral stone. --Left kidney/ureter: Severe hydroureteronephrosis. Layering stones dependently within the renal collecting system and renal pelvis. No ureteral stone. --Urinary bladder: The urinary bladder is severely dilated with multifocal anti-dependent gas. STOMACH/BOWEL: There are multiple foci of free air within the abdomen, most evident beneath the right hemidiaphragm. VASCULAR/LYMPHATIC: Normal course and caliber of the major abdominal vessels. No abdominal or pelvic lymphadenopathy. REPRODUCTIVE: Surgical clips adjacent to the prostate. MUSCULOSKELETAL. Severe L4-5 spinal canal stenosis, unchanged. OTHER: None. IMPRESSION: 1. Small volume pneumoperitoneum, most evident beneath the right hemidiaphragm, suggesting bowel perforation. No identifiable source. 2. Severely dilated urinary bladder with severe bilateral hydroureteronephrosis. Multiple left renal calculi, but no obstructing stones. 3. Gas within the urinary bladder is probably secondary to catheterization. 4. Severe L4-5 spinal canal stenosis. Electronically Signed: By: Deatra RobinsonKevin  Herman M.D. On: 04/07/2018 22:09   Dg Abd Acute W/chest  Result Date: 04/07/2018 CLINICAL DATA:  Patient has not eaten days. Diarrhea and weakness. EXAM: DG ABDOMEN ACUTE W/ 1V CHEST COMPARISON:  CT 06/19/2016 FINDINGS: Low lung volumes are identified on  the frontal view of the chest with top normal size cardiac silhouette. Nonaneurysmal appearing thoracic aorta is noted. There is atelectasis at the right base. No overt pulmonary edema, pleural effusion or pneumothorax. No acute pulmonary consolidation. There is no free air beneath the diaphragm. Numerous renal calculi project over the left hemiabdomen, some possibly in the expected location of the renal pelvis. Mild diffuse gaseous distention of large bowel is noted without definite evidence of mechanical bowel obstruction. Surgical clips project over the lower pelvis with stable phlebolith in the left hemipelvis. Small amount of retained stool is seen in the rectum. Thoracolumbar spondylosis without acute osseous abnormality. IMPRESSION: 1. Low lung volumes with right basilar atelectasis. 2. Mild diffuse gaseous distention of large bowel without definite evidence of mechanical bowel obstruction. There is no free air. 3. Left-sided nephrolithiasis, some potentially within the expected location of the renal pelvis. Electronically Signed   By: Tollie Ethavid  Kwon M.D.   On: 04/07/2018 17:25        Scheduled Meds: . enoxaparin (LOVENOX) injection  30 mg Subcutaneous Q24H  . insulin aspart  0-9 Units Subcutaneous TID WC  . vancomycin  125 mg Oral QID   Continuous Infusions: . 0.45 % NaCl with KCl 20 mEq / L 125 mL/hr at 04/09/18 1128  . cefTRIAXone (ROCEPHIN)  IV Stopped (04/09/18 0300)     LOS: 2 days    Time spent: 35mins    Erick BlinksJehanzeb Memon, MD [facility] Pager [phone]  If 7PM-7AM, please contact night-coverage [email or web address] Password [email or web address] 04/09/2018, 2:21 PM

## 2018-04-10 ENCOUNTER — Inpatient Hospital Stay (HOSPITAL_COMMUNITY): Payer: Medicare Other

## 2018-04-10 DIAGNOSIS — R140 Abdominal distension (gaseous): Secondary | ICD-10-CM | POA: Diagnosis not present

## 2018-04-10 DIAGNOSIS — K668 Other specified disorders of peritoneum: Secondary | ICD-10-CM | POA: Diagnosis not present

## 2018-04-10 DIAGNOSIS — N179 Acute kidney failure, unspecified: Secondary | ICD-10-CM | POA: Diagnosis not present

## 2018-04-10 DIAGNOSIS — N401 Enlarged prostate with lower urinary tract symptoms: Secondary | ICD-10-CM | POA: Diagnosis not present

## 2018-04-10 DIAGNOSIS — N1330 Unspecified hydronephrosis: Secondary | ICD-10-CM | POA: Diagnosis not present

## 2018-04-10 LAB — BASIC METABOLIC PANEL
ANION GAP: 5 (ref 5–15)
BUN: 45 mg/dL — ABNORMAL HIGH (ref 8–23)
CO2: 30 mmol/L (ref 22–32)
Calcium: 8.1 mg/dL — ABNORMAL LOW (ref 8.9–10.3)
Chloride: 109 mmol/L (ref 98–111)
Creatinine, Ser: 1.49 mg/dL — ABNORMAL HIGH (ref 0.61–1.24)
GFR calc Af Amer: 55 mL/min — ABNORMAL LOW (ref >60)
GFR calc non Af Amer: 48 mL/min — ABNORMAL LOW (ref >60)
Glucose, Bld: 153 mg/dL — ABNORMAL HIGH (ref 70–99)
Potassium: 3.2 mmol/L — ABNORMAL LOW (ref 3.5–5.1)
Sodium: 144 mmol/L (ref 135–145)

## 2018-04-10 LAB — GLUCOSE, CAPILLARY
GLUCOSE-CAPILLARY: 159 mg/dL — AB (ref 70–99)
Glucose-Capillary: 156 mg/dL — ABNORMAL HIGH (ref 70–99)
Glucose-Capillary: 141 mg/dL — ABNORMAL HIGH (ref 70–99)

## 2018-04-10 LAB — CBC
HCT: 33.4 % — ABNORMAL LOW (ref 39.0–52.0)
Hemoglobin: 10.7 g/dL — ABNORMAL LOW (ref 13.0–17.0)
MCH: 28.5 pg (ref 26.0–34.0)
MCHC: 32 g/dL (ref 30.0–36.0)
MCV: 89.1 fL (ref 80.0–100.0)
Platelets: 212 10*3/uL (ref 150–400)
RBC: 3.75 MIL/uL — AB (ref 4.22–5.81)
RDW: 13.2 % (ref 11.5–15.5)
WBC: 16.1 10*3/uL — AB (ref 4.0–10.5)
nRBC: 0 % (ref 0.0–0.2)

## 2018-04-10 MED ORDER — POTASSIUM CHLORIDE 10 MEQ/100ML IV SOLN
10.0000 meq | INTRAVENOUS | Status: AC
  Administered 2018-04-11 (×2): 10 meq via INTRAVENOUS
  Filled 2018-04-10 (×4): qty 100

## 2018-04-10 MED ORDER — SODIUM CHLORIDE 0.45 % IV SOLN
INTRAVENOUS | Status: DC
  Administered 2018-04-10 (×2): via INTRAVENOUS
  Filled 2018-04-10 (×13): qty 1000

## 2018-04-10 MED ORDER — ENOXAPARIN SODIUM 40 MG/0.4ML ~~LOC~~ SOLN
40.0000 mg | SUBCUTANEOUS | Status: DC
  Administered 2018-04-10 – 2018-04-11 (×2): 40 mg via SUBCUTANEOUS
  Filled 2018-04-10 (×2): qty 0.4

## 2018-04-10 NOTE — Progress Notes (Signed)
Patient with prominent abdominal distention. Rectal tube inserted to help decompress abdomen.  Patient passed large amounts of gas.  This resulted in his abdominal distention decreasing by a large amount.  Patient tolerated well.  Patient nodded that he felt better after this was completed.

## 2018-04-10 NOTE — Progress Notes (Signed)
  Subjective: No apparent discomfort or distress.  Objective: Vital signs in last 24 hours: Temp:  [97.3 F (36.3 C)-98.7 F (37.1 C)] 98.7 F (37.1 C) (12/26 0536) Pulse Rate:  [84-90] 84 (12/26 0536) Resp:  [17-18] 17 (12/26 0536) BP: (116-133)/(58-66) 133/66 (12/26 0536) SpO2:  [97 %-100 %] 99 % (12/26 0536) Weight:  [68.7 kg] 68.7 kg (12/26 0536) Last BM Date: 04/08/18  Intake/Output from previous day: 12/25 0701 - 12/26 0700 In: 3031.4 [P.O.:840; I.V.:2191.4] Out: 3835 [Urine:3835] Intake/Output this shift: No intake/output data recorded.  General appearance: alert and no distress GI: Soft, mildly distended but no rigidity noted.  No tenderness noted.  Bowel sounds active.  Lab Results:  Recent Labs    04/09/18 0533 04/10/18 0452  WBC 12.5* 16.1*  HGB 11.0* 10.7*  HCT 32.8* 33.4*  PLT 210 212   BMET Recent Labs    04/09/18 0533 04/10/18 0452  NA 144 144  K 3.4* 3.2*  CL 108 109  CO2 28 30  GLUCOSE 430* 153*  BUN 85* 45*  CREATININE 3.06* 1.49*  CALCIUM 8.2* 8.1*   PT/INR No results for input(s): LABPROT, INR in the last 72 hours.  Studies/Results: No results found.  Anti-infectives: Anti-infectives (From admission, onward)   Start     Dose/Rate Route Frequency Ordered Stop   04/08/18 2015  cefTRIAXone (ROCEPHIN) 1 g in sodium chloride 0.9 % 100 mL IVPB  Status:  Discontinued     1 g 200 mL/hr over 30 Minutes Intravenous Every 24 hours 04/08/18 2001 04/09/18 1426   04/08/18 1400  vancomycin (VANCOCIN) 50 mg/mL oral solution 125 mg     125 mg Oral 4 times daily 04/08/18 1257 04/18/18 1359   04/08/18 1000  piperacillin-tazobactam (ZOSYN) IVPB 3.375 g  Status:  Discontinued     3.375 g 12.5 mL/hr over 240 Minutes Intravenous Every 12 hours 04/08/18 0740 04/08/18 2001   04/07/18 2245  piperacillin-tazobactam (ZOSYN) IVPB 3.375 g     3.375 g 12.5 mL/hr over 240 Minutes Intravenous  Once 04/07/18 2232 04/08/18 [id number]       Assessment/Plan: Impression: C. difficile colitis.  Diarrhea seems to be improving.  Patient tolerating diet well.  No nausea or vomiting noted.  Abdominal distention present, but caregiver notes that this is not uncommon for him.  No need for surgical intervention.  Will continue to follow peripherally with you.  LOS: 3 days    Franky MachoMark Vianka Ertel 04/10/2018

## 2018-04-10 NOTE — Progress Notes (Signed)
PROGRESS NOTE    [name]  ZOX:[id number] DOB: [birth year] DOA: 04/07/2018 PCP: [name], [name], MD    Brief Narrative:  67 year old male with a history of cognitive deficits, diabetes, hypertension, presents to the hospital with elevated blood sugars, diarrhea and dehydration.  Found to have acute kidney injury related to volume depletion from diarrhea.  He is also had distended bladder, bilateral hydronephrosis consistent with an obstructive process.  Foley catheter placed with expulsion of 2500 cc of urine.  Creatinine is trending down.  He also complained of diarrhea and tested positive for C. difficile.  Currently on vancomycin.   Assessment & Plan:   Principal Problem:   AKI (acute kidney injury) (HCC) Active Problems:   Urinary retention   Intellectual disability   HTN (hypertension)   Diabetes mellitus type 2, controlled (HCC)   BPH (benign prostatic hyperplasia)   Diarrhea   Nonverbal   Pneumoperitoneum of unknown etiology   C. difficile diarrhea   Bilateral hydronephrosis   Pressure injury of skin   1. Acute kidney injury.  Likely a combination of prerenal from GI losses as well as postobstructive from urinary retention.  Foley catheter has been placed.  He is having large amounts of urine output.  He immediately had 2500 cc of urine out after catheter was placed.  CT scan was reviewed with urology, Dr. Mena GoesEskridge who recommended continued bladder decompression with Foley catheter as well as covering for urinary tract infection with Rocephin.  Urine culture did not show any specific growth.  Will discontinue further antibiotics.  Continue to follow serial renal function.  Continue IV fluids and electrolyte replacement to compensate for postobstructive diuresis. 2. Hypokalemia.  Replace 3. Urinary retention.  Etiology is unclear.  Renal ultrasound shows bilateral hydronephrosis.  Family reports that he had similar episode in the past and they performed in and out  catheterizations at home.  Will likely need to do the same on discharge.  Follow-up with urology as an outpatient.  Bladder has been decompressed with Foley catheter. 4. C. difficile diarrhea.  Patient tested positive for C. difficile.  He is currently been started on vancomycin.  Overall diarrhea improving.  He does have significant abdominal distention which appears to be gaseous.  KUB indicates enlarged colon.  Abdomen is otherwise soft and nontender.  Will replace rectal tube for decompression.  Repeat labs in a.m. since WBC count has trended up today. 5. Hypertension.  ACE inhibitor currently on hold.  Blood pressure stable 6. Diabetes.  Hold metformin.  Continue on sliding scale insulin.  Blood sugars are improved since starting on Lantus.  A1c in process. 7. Pneumoperitoneum of unknown etiology.  General surgery following.  Felt to be secondary to C. difficile colitis.  No indication for surgery at this point.  Continue to follow.   DVT prophylaxis: Lovenox Code Status: Full code Family Communication: Discussed with family at the bedside Disposition Plan: Discharge home once improved   Consultants:   General surgery  Procedures:     Antimicrobials:   Vancomycin po 12/24 >  Ceftriaxone 12/24>12/25   Subjective: Family reports that diarrhea is improving.  Objective: Vitals:   04/09/18 1921 04/09/18 2144 04/10/18 0536 04/10/18 1616  BP:  116/60 133/66 108/66  Pulse:  84 84 83  Resp:  18 17 18   Temp:  97.8 F (36.6 C) 98.7 F (37.1 C)   TempSrc:  Oral Oral   SpO2: 100% 97% 99% 98%  Weight:   68.7 kg   Height:  Intake/Output Summary (Last 24 hours) at 04/10/2018 2048 Last data filed at 04/10/2018 1906 Gross per 24 hour  Intake 3753.42 ml  Output 1675 ml  Net 2078.42 ml   Filed Weights   04/08/18 0541 04/09/18 0540 04/10/18 0536  Weight: 79.2 kg 77.2 kg 68.7 kg    Examination:  General exam: Alert, awake, no distress Respiratory system: Clear to  auscultation. Respiratory effort normal. Cardiovascular system:RRR. No murmurs, rubs, gallops. Gastrointestinal system: Abdomen is distended, soft and nontender. No organomegaly or masses felt. Normal bowel sounds heard. Central nervous system: No focal neurological deficits. Extremities: No C/C/E, +pedal pulses Skin: No rashes, lesions or ulcers Psychiatry: Calm, flat affect.    Data Reviewed: I have personally reviewed following labs and imaging studies  CBC: Recent Labs  Lab 04/07/18 1631 04/08/18 0555 04/09/18 0533 04/10/18 0452  WBC 15.2* 13.9* 12.5* 16.1*  NEUTROABS 13.3*  --   --   --   HGB 12.6* 10.6* 11.0* 10.7*  HCT 37.3* 31.8* 32.8* 33.4*  MCV 85.6 86.6 85.4 89.1  PLT 207 184 210 212   Basic Metabolic Panel: Recent Labs  Lab 04/07/18 1631 04/08/18 0555 04/08/18 1512 04/09/18 0533 04/10/18 0452  NA 129* 133* 134* 144 144  K 3.7 3.6 3.4* 3.4* 3.2*  CL 99 107 108 108 109  CO2 19* 16* 19* 28 30  GLUCOSE 503* 139* 343* 430* 153*  BUN 142* 136* 115* 85* 45*  CREATININE 5.85* 6.24* 5.02* 3.06* 1.49*  CALCIUM 8.7* 7.9* 7.8* 8.2* 8.1*   GFR: Estimated Creatinine Clearance: 46.1 mL/min (A) (by C-G formula based on SCr of 1.49 mg/dL (H)). Liver Function Tests: No results for input(s): AST, ALT, ALKPHOS, BILITOT, PROT, ALBUMIN in the last 168 hours. No results for input(s): LIPASE, AMYLASE in the last 168 hours. No results for input(s): AMMONIA in the last 168 hours. Coagulation Profile: No results for input(s): INR, PROTIME in the last 168 hours. Cardiac Enzymes: No results for input(s): CKTOTAL, CKMB, CKMBINDEX, TROPONINI in the last 168 hours. BNP (last 3 results) No results for input(s): PROBNP in the last 8760 hours. HbA1C: No results for input(s): HGBA1C in the last 72 hours. CBG: Recent Labs  Lab 04/09/18 1613 04/09/18 2239 04/10/18 0743 04/10/18 1134 04/10/18 1615  GLUCAP 157* 240* 156* 159* 141*   Lipid Profile: No results for input(s): CHOL,  HDL, LDLCALC, TRIG, CHOLHDL, LDLDIRECT in the last 72 hours. Thyroid Function Tests: No results for input(s): TSH, T4TOTAL, FREET4, T3FREE, THYROIDAB in the last 72 hours. Anemia Panel: No results for input(s): VITAMINB12, FOLATE, FERRITIN, TIBC, IRON, RETICCTPCT in the last 72 hours. Sepsis Labs: Recent Labs  Lab 04/07/18 1902 04/07/18 2108  LATICACIDVEN 2.4* 2.1*    Recent Results (from the past 240 hour(s))  Culture, Urine     Status: None   Collection Time: 04/08/18 10:50 AM  Result Value Ref Range Status   Specimen Description   Final    URINE, CATHETERIZED Performed at [facility], [address], [place], [place] [address]    Special Requests   Final    NONE Performed at [facility], [address] [place], [place] [address]    Culture   Final    Multiple bacterial morphotypes present, none predominant. Suggest appropriate recollection if clinically indicated.   Report Status 04/09/2018 FINAL  Final  Gastrointestinal Panel by PCR , Stool     Status: None   Collection Time: 04/08/18 11:04 AM  Result Value Ref Range Status   Campylobacter species NOT  DETECTED NOT DETECTED Final   Plesimonas shigelloides NOT DETECTED NOT DETECTED Final   Salmonella species NOT DETECTED NOT DETECTED Final   Yersinia enterocolitica NOT DETECTED NOT DETECTED Final   Vibrio species NOT DETECTED NOT DETECTED Final   Vibrio cholerae NOT DETECTED NOT DETECTED Final   Enteroaggregative E coli (EAEC) NOT DETECTED NOT DETECTED Final   Enteropathogenic E coli (EPEC) NOT DETECTED NOT DETECTED Final   Enterotoxigenic E coli (ETEC) NOT DETECTED NOT DETECTED Final   Shiga like toxin producing E coli (STEC) NOT DETECTED NOT DETECTED Final   Shigella/Enteroinvasive E coli (EIEC) NOT DETECTED NOT DETECTED Final   Cryptosporidium NOT DETECTED NOT DETECTED Final   Cyclospora cayetanensis NOT DETECTED NOT DETECTED Final   Entamoeba histolytica NOT DETECTED NOT DETECTED Final   Giardia lamblia NOT  DETECTED NOT DETECTED Final   Adenovirus F40/41 NOT DETECTED NOT DETECTED Final   Astrovirus NOT DETECTED NOT DETECTED Final   Norovirus GI/GII NOT DETECTED NOT DETECTED Final   Rotavirus A NOT DETECTED NOT DETECTED Final   Sapovirus (I, II, IV, and V) NOT DETECTED NOT DETECTED Final    Comment: Performed at [facility], [address] [place], [place] [address]  C difficile quick scan w PCR reflex     Status: Abnormal   Collection Time: 04/08/18 11:10 AM  Result Value Ref Range Status   C Diff antigen POSITIVE (A) NEGATIVE Final   C Diff toxin POSITIVE (A) NEGATIVE Final    Comment: CRITICAL RESULT CALLED TO, READ BACK BY AND VERIFIED WITH: MARTIN,D. AT 1254 ON 04/08/2018 BY BAUGHAM,M.    C Diff interpretation Toxin producing C. difficile detected.  Final    Comment: CRITICAL RESULT CALLED TO, READ BACK BY AND VERIFIED WITH: MARTIN,D. AT 1254 ON 04/08/2018 BY Ginette PitmanBAUGHAM,M. Performed at [facility], [address] [place], [place] [address]          Radiology Studies: Dg Abd 1 View  Result Date: 04/10/2018 CLINICAL DATA:  Abdominal distention EXAM: ABDOMEN - 1 VIEW COMPARISON:  CT abdomen pelvis of 04/07/2017 FINDINGS: There is considerable gaseous distention of large and small bowel. This may be due to ileus, but a view of the colonic distension, a distal colonic lesion or stricture can not be excluded. There do appear to be renal calculi present. Several metallic devices are noted in the region of the prostate. IMPRESSION: Gaseous distention of large and small bowel. Can not excluded distal colonic lesion or stricture. Renal calculi. Electronically Signed   By: Dwyane DeePaul  Barry M.D.   On: 04/10/2018 12:58        Scheduled Meds: . enoxaparin (LOVENOX) injection  40 mg Subcutaneous Q24H  . insulin aspart  0-9 Units Subcutaneous TID WC  . insulin glargine  20 Units Subcutaneous Daily  . saccharomyces boulardii  250 mg Oral BID  . tamsulosin  0.4 mg Oral QPC supper  .  vancomycin  125 mg Oral QID   Continuous Infusions: . potassium chloride    . sodium chloride 0.45 % 1,000 mL with potassium chloride 20 mEq infusion 125 mL/hr at 04/10/18 1500     LOS: 3 days    Time spent: 35mins    Erick BlinksJehanzeb Maile Linford, MD [facility] Pager [phone]  If 7PM-7AM, please contact night-coverage [email or web address] Password [email or web address] 04/10/2018, 8:48 PM

## 2018-04-11 DIAGNOSIS — K668 Other specified disorders of peritoneum: Secondary | ICD-10-CM | POA: Diagnosis not present

## 2018-04-11 DIAGNOSIS — N179 Acute kidney failure, unspecified: Secondary | ICD-10-CM | POA: Diagnosis not present

## 2018-04-11 DIAGNOSIS — N401 Enlarged prostate with lower urinary tract symptoms: Secondary | ICD-10-CM | POA: Diagnosis not present

## 2018-04-11 DIAGNOSIS — R140 Abdominal distension (gaseous): Secondary | ICD-10-CM

## 2018-04-11 DIAGNOSIS — F79 Unspecified intellectual disabilities: Secondary | ICD-10-CM

## 2018-04-11 DIAGNOSIS — N1330 Unspecified hydronephrosis: Secondary | ICD-10-CM | POA: Diagnosis not present

## 2018-04-11 DIAGNOSIS — R339 Retention of urine, unspecified: Secondary | ICD-10-CM

## 2018-04-11 DIAGNOSIS — A0472 Enterocolitis due to Clostridium difficile, not specified as recurrent: Secondary | ICD-10-CM | POA: Diagnosis not present

## 2018-04-11 DIAGNOSIS — R4701 Aphasia: Secondary | ICD-10-CM

## 2018-04-11 LAB — CBC
HCT: 33.4 % — ABNORMAL LOW (ref 39.0–52.0)
Hemoglobin: 10.3 g/dL — ABNORMAL LOW (ref 13.0–17.0)
MCH: 27.8 pg (ref 26.0–34.0)
MCHC: 30.8 g/dL (ref 30.0–36.0)
MCV: 90.3 fL (ref 80.0–100.0)
Platelets: 223 10*3/uL (ref 150–400)
RBC: 3.7 MIL/uL — ABNORMAL LOW (ref 4.22–5.81)
RDW: 13.3 % (ref 11.5–15.5)
WBC: 13.8 10*3/uL — AB (ref 4.0–10.5)
nRBC: 0 % (ref 0.0–0.2)

## 2018-04-11 LAB — BASIC METABOLIC PANEL
ANION GAP: 5 (ref 5–15)
BUN: 25 mg/dL — ABNORMAL HIGH (ref 8–23)
CO2: 29 mmol/L (ref 22–32)
Calcium: 7.7 mg/dL — ABNORMAL LOW (ref 8.9–10.3)
Chloride: 109 mmol/L (ref 98–111)
Creatinine, Ser: 1.17 mg/dL (ref 0.61–1.24)
GFR calc Af Amer: >60 mL/min (ref >60)
Glucose, Bld: 91 mg/dL (ref 70–99)
Potassium: 3.6 mmol/L (ref 3.5–5.1)
SODIUM: 143 mmol/L (ref 135–145)

## 2018-04-11 LAB — GLUCOSE, CAPILLARY
GLUCOSE-CAPILLARY: 90 mg/dL (ref 70–99)
Glucose-Capillary: 91 mg/dL (ref 70–99)
Glucose-Capillary: 150 mg/dL — ABNORMAL HIGH (ref 70–99)
Glucose-Capillary: 125 mg/dL — ABNORMAL HIGH (ref 70–99)

## 2018-04-11 LAB — HEMOGLOBIN A1C
Hgb A1c MFr Bld: 8 % — ABNORMAL HIGH (ref 4.8–5.6)
Mean Plasma Glucose: 183 mg/dL

## 2018-04-11 MED ORDER — SODIUM CHLORIDE 0.45 % IV SOLN
INTRAVENOUS | Status: AC
  Administered 2018-04-11: 22:00:00 via INTRAVENOUS
  Filled 2018-04-11: qty 1000

## 2018-04-11 MED ORDER — POTASSIUM CHLORIDE IN NACL 20-0.45 MEQ/L-% IV SOLN
INTRAVENOUS | Status: DC
  Administered 2018-04-11 – 2018-04-12 (×2): via INTRAVENOUS
  Filled 2018-04-11 (×15): qty 1000

## 2018-04-11 NOTE — Progress Notes (Signed)
PROGRESS NOTE    [name]  ZOX:[id number] DOB: 1950-05-01 DOA: 04/07/2018 PCP: [name], [name], MD    Brief Narrative:  67 year old male with a history of cognitive deficits, diabetes, hypertension, presents to the hospital with elevated blood sugars, diarrhea and dehydration.  Found to have acute kidney injury related to volume depletion from diarrhea.  He is also had distended bladder, bilateral hydronephrosis consistent with an obstructive process.  Foley catheter placed with expulsion of 2500 cc of urine.  Creatinine is trending down.  He also complained of diarrhea and tested positive for C. difficile.  Currently on vancomycin.   Assessment & Plan:   Principal Problem:   AKI (acute kidney injury) (HCC) Active Problems:   Urinary retention   Intellectual disability   HTN (hypertension)   Diabetes mellitus type 2, controlled (HCC)   BPH (benign prostatic hyperplasia)   Diarrhea   Nonverbal   Pneumoperitoneum of unknown etiology   C. difficile diarrhea   Bilateral hydronephrosis   Pressure injury of skin   Abdominal distention   1. Acute kidney injury.  Likely a combination of prerenal from GI losses as well as postobstructive from urinary retention.  Foley catheter has been placed.  He is having large amounts of urine output.  He immediately had 2500 cc of urine out after catheter was placed.  CT scan was reviewed with urology, Dr. Mena GoesEskridge who recommended continued bladder decompression with Foley catheter as well as covering for urinary tract infection with Rocephin.  Urine culture did not show any specific growth.  Will discontinue further antibiotics.  Continue to follow serial renal function.  Continue IV fluids and electrolyte replacement to compensate for postobstructive diuresis. 2. Hypokalemia.  Replace 3. Urinary retention.  Etiology is unclear.  Renal ultrasound shows bilateral hydronephrosis.  Family reports that he had similar episode in the past and they performed  in and out catheterizations at home.  Will likely need to do the same on discharge.  Follow-up with urology as an outpatient.  Bladder has been decompressed with Foley catheter. 4. C. difficile diarrhea.  Patient tested positive for C. difficile.  He is currently been started on vancomycin.  Overall diarrhea improving.  He does have significant abdominal distention which appears to be gaseous.  KUB indicates enlarged colon.  Abdomen is otherwise soft and nontender.  Abdomen was decompressed when rectal tube was placed.  After this was removed, distention has recurred.  Gastroenterology consultation requested. 5. Hypertension.  ACE inhibitor currently on hold.  Blood pressure stable 6. Diabetes.  Hold metformin.  Continue on sliding scale insulin.  Blood sugars are improved since starting on Lantus.  A1c 8.0. 7. Pneumoperitoneum of unknown etiology.  General surgery following.  Felt to be secondary to C. difficile colitis.  No indication for surgery at this point.  Continue to follow.   DVT prophylaxis: Lovenox Code Status: Full code Family Communication: Discussed with family at the bedside Disposition Plan: Discharge home once improved   Consultants:   General surgery  Procedures:     Antimicrobials:   Vancomycin po 12/24 >  Ceftriaxone 12/24>12/25   Subjective: Family reports that diarrhea is improving.  Objective: Vitals:   04/10/18 2048 04/10/18 2157 04/11/18 0544 04/11/18 1308  BP:  113/66 107/69 (!) 110/58  Pulse:  90 85 98  Resp:  18 18 18   Temp:  99.3 F (37.4 C) (!) 97.5 F (36.4 C) 98.6 F (37 C)  TempSrc:  Oral Oral Oral  SpO2: 97% 98% 97% 97%  Weight:   71.8 kg   Height:        Intake/Output Summary (Last 24 hours) at 04/11/2018 1836 Last data filed at 04/11/2018 1606 Gross per 24 hour  Intake 621.49 ml  Output 2450 ml  Net -1828.51 ml   Filed Weights   04/09/18 0540 04/10/18 0536 04/11/18 0544  Weight: 77.2 kg 68.7 kg 71.8 kg     Examination:  General exam: Alert, awake, oriented x 3 Respiratory system: Clear to auscultation. Respiratory effort normal. Cardiovascular system:RRR. No murmurs, rubs, gallops. Gastrointestinal system: Abdomen is nondistended, soft and nontender. No organomegaly or masses felt. Normal bowel sounds heard. Central nervous system: Alert and oriented. No focal neurological deficits. Extremities: No C/C/E, +pedal pulses Skin: No rashes, lesions or ulcers Psychiatry: Judgement and insight appear normal. Mood & affect appropriate.     Data Reviewed: I have personally reviewed following labs and imaging studies  CBC: Recent Labs  Lab 04/07/18 1631 04/08/18 0555 04/09/18 0533 04/10/18 0452 04/11/18 0437  WBC 15.2* 13.9* 12.5* 16.1* 13.8*  NEUTROABS 13.3*  --   --   --   --   HGB 12.6* 10.6* 11.0* 10.7* 10.3*  HCT 37.3* 31.8* 32.8* 33.4* 33.4*  MCV 85.6 86.6 85.4 89.1 90.3  PLT 207 184 210 212 223   Basic Metabolic Panel: Recent Labs  Lab 04/08/18 0555 04/08/18 1512 04/09/18 0533 04/10/18 0452 04/11/18 0437  NA 133* 134* 144 144 143  K 3.6 3.4* 3.4* 3.2* 3.6  CL 107 108 108 109 109  CO2 16* 19* 28 30 29   GLUCOSE 139* 343* 430* 153* 91  BUN 136* 115* 85* 45* 25*  CREATININE 6.24* 5.02* 3.06* 1.49* 1.17  CALCIUM 7.9* 7.8* 8.2* 8.1* 7.7*   GFR: Estimated Creatinine Clearance: 61.4 mL/min (by C-G formula based on SCr of 1.17 mg/dL). Liver Function Tests: No results for input(s): AST, ALT, ALKPHOS, BILITOT, PROT, ALBUMIN in the last 168 hours. No results for input(s): LIPASE, AMYLASE in the last 168 hours. No results for input(s): AMMONIA in the last 168 hours. Coagulation Profile: No results for input(s): INR, PROTIME in the last 168 hours. Cardiac Enzymes: No results for input(s): CKTOTAL, CKMB, CKMBINDEX, TROPONINI in the last 168 hours. BNP (last 3 results) No results for input(s): PROBNP in the last 8760 hours. HbA1C: Recent Labs    04/09/18 0533  HGBA1C  8.0*   CBG: Recent Labs  Lab 04/10/18 1134 04/10/18 1615 04/11/18 0817 04/11/18 1221 04/11/18 1655  GLUCAP 159* 141* 91 150* 125*   Lipid Profile: No results for input(s): CHOL, HDL, LDLCALC, TRIG, CHOLHDL, LDLDIRECT in the last 72 hours. Thyroid Function Tests: No results for input(s): TSH, T4TOTAL, FREET4, T3FREE, THYROIDAB in the last 72 hours. Anemia Panel: No results for input(s): VITAMINB12, FOLATE, FERRITIN, TIBC, IRON, RETICCTPCT in the last 72 hours. Sepsis Labs: Recent Labs  Lab 04/07/18 1902 04/07/18 2108  LATICACIDVEN 2.4* 2.1*    Recent Results (from the past 240 hour(s))  Culture, Urine     Status: None   Collection Time: 04/08/18 10:50 AM  Result Value Ref Range Status   Specimen Description   Final    URINE, CATHETERIZED Performed at [facility], [address] [place], [place] [address]    Special Requests   Final    NONE Performed at [facility], [address] [place], [place] [address]    Culture   Final    Multiple bacterial morphotypes present, none predominant. Suggest appropriate recollection if clinically indicated.   Report  Status 04/09/2018 FINAL  Final  Gastrointestinal Panel by PCR , Stool     Status: None   Collection Time: 04/08/18 11:04 AM  Result Value Ref Range Status   Campylobacter species NOT DETECTED NOT DETECTED Final   Plesimonas shigelloides NOT DETECTED NOT DETECTED Final   Salmonella species NOT DETECTED NOT DETECTED Final   Yersinia enterocolitica NOT DETECTED NOT DETECTED Final   Vibrio species NOT DETECTED NOT DETECTED Final   Vibrio cholerae NOT DETECTED NOT DETECTED Final   Enteroaggregative E coli (EAEC) NOT DETECTED NOT DETECTED Final   Enteropathogenic E coli (EPEC) NOT DETECTED NOT DETECTED Final   Enterotoxigenic E coli (ETEC) NOT DETECTED NOT DETECTED Final   Shiga like toxin producing E coli (STEC) NOT DETECTED NOT DETECTED Final   Shigella/Enteroinvasive E coli (EIEC) NOT DETECTED NOT DETECTED Final    Cryptosporidium NOT DETECTED NOT DETECTED Final   Cyclospora cayetanensis NOT DETECTED NOT DETECTED Final   Entamoeba histolytica NOT DETECTED NOT DETECTED Final   Giardia lamblia NOT DETECTED NOT DETECTED Final   Adenovirus F40/41 NOT DETECTED NOT DETECTED Final   Astrovirus NOT DETECTED NOT DETECTED Final   Norovirus GI/GII NOT DETECTED NOT DETECTED Final   Rotavirus A NOT DETECTED NOT DETECTED Final   Sapovirus (I, II, IV, and V) NOT DETECTED NOT DETECTED Final    Comment: Performed at [facility], [address] [place], [place] [address]  C difficile quick scan w PCR reflex     Status: Abnormal   Collection Time: 04/08/18 11:10 AM  Result Value Ref Range Status   C Diff antigen POSITIVE (A) NEGATIVE Final   C Diff toxin POSITIVE (A) NEGATIVE Final    Comment: CRITICAL RESULT CALLED TO, READ BACK BY AND VERIFIED WITH: MARTIN,D. AT 1254 ON 04/08/2018 BY BAUGHAM,M.    C Diff interpretation Toxin producing C. difficile detected.  Final    Comment: CRITICAL RESULT CALLED TO, READ BACK BY AND VERIFIED WITH: MARTIN,D. AT 1254 ON 04/08/2018 BY Ginette Pitman. Performed at [facility], [address], [place], [place] [address]          Radiology Studies: Dg Abd 1 View  Result Date: 04/10/2018 CLINICAL DATA:  Abdominal distention EXAM: ABDOMEN - 1 VIEW COMPARISON:  CT abdomen pelvis of 04/07/2017 FINDINGS: There is considerable gaseous distention of large and small bowel. This may be due to ileus, but a view of the colonic distension, a distal colonic lesion or stricture can not be excluded. There do appear to be renal calculi present. Several metallic devices are noted in the region of the prostate. IMPRESSION: Gaseous distention of large and small bowel. Can not excluded distal colonic lesion or stricture. Renal calculi. Electronically Signed   By: Dwyane Dee M.D.   On: 04/10/2018 12:58        Scheduled Meds: . enoxaparin (LOVENOX) injection  40 mg Subcutaneous Q24H  .  insulin aspart  0-9 Units Subcutaneous TID WC  . insulin glargine  20 Units Subcutaneous Daily  . saccharomyces boulardii  250 mg Oral BID  . tamsulosin  0.4 mg Oral QPC supper  . vancomycin  125 mg Oral QID   Continuous Infusions: . 0.45 % NaCl with KCl 20 mEq / L 125 mL/hr at 04/11/18 1047     LOS: 4 days    Time spent:    Erick Blinks, MD [facility] Pager [phone]  If 7PM-7AM, please contact night-coverage [email or web address] Password [email or web address] 04/11/2018, 6:36 PM

## 2018-04-11 NOTE — Consult Note (Signed)
Referring Provider: [facility] Primary Care Physician:  Pearson Grippe, MD Primary Gastroenterologist:  Dr. Darrick Penna  Date of Admission: 04/07/18 Date of Consultation: 04/11/18  Reason for Consultation:  Abdominal distension, C. Diff infection  HPI:  [name] is a 67 y.o. male with a past medical history of diabetes, kidney stones, urinary retention, mentally challenged.  He presented to the emergency department after recent treatment for UTI in the last month with uncontrolled blood sugars and diarrhea for over 2 weeks.  Also noted abdominal distention.  No nausea or vomiting, no fevers.  Patient is chronically nonverbal but per caregivers not acting like he is in pain.  Noted acute kidney injury with creatinine over 5 and was subsequently admitted.  Surgery was consulted due to mild pneumoperitoneum felt to be likely due to C. difficile.  His C. difficile quick scan was positive for antigen and toxin and he was started on vancomycin for C. difficile infection.  Foley placement has decompressed his bladder.  At one point during follow-up from surgeon the caregiver noted that some abdominal distention is not uncommon for him.  Diarrhea improving overall on vancomycin.  Distention appears to be gaseous with KUB indicating a large colon.  Rectal tube was placed for decompression which was successful.  Interestingly, he was admitted in 2016 and GI was consulted for colon distention which was felt to be likely partial obstruction due to bladder distention.  Today he is accompanied at the bedside by his aunt and sister. They note that he began having diarrhea about 2 weeks ago. They state he has chronic abdominal distension. They feel his abdomen was significantly distended yesterday, now improved after rectal tube decompression. They affirmed 3 times that his abdomen now is about baseline for him. They deny any bowel movement since they've been here today. The patient nods "no" when asked about  abdominal pain. Family denies hematochezia or melena.  Spoke with nursing staff and no bowel movement yet today, when leaving the unit was informed of first bowel movement of the day which was felt to be soft/loose.  Past Medical History:  Diagnosis Date  . Diabetes mellitus without complication (HCC)   . History of gout   . History of kidney stones   . Hypertension   . Mentally challenged   . Urinary retention     Past Surgical History:  Procedure Laterality Date  . CYSTOSCOPY W/ URETERAL STENT PLACEMENT Bilateral 06/20/2016   Procedure: CYSTOSCOPY WITH RETROGRADE PYELOGRAM/URETERAL STENT PLACEMENT;  Surgeon: Malen Gauze, MD;  Location: [facility];  Service: Urology;  Laterality: Bilateral;  . CYSTOSCOPY WITH RETROGRADE PYELOGRAM, URETEROSCOPY AND STENT PLACEMENT Bilateral 08/20/2016   Procedure: CYSTOSCOPY WITH BILATERAL RETROGRADE PYELOGRAM, BILATERAL URETEROSCOPY,  LASER LITHOTRIPSY OF RIGHT URETERAL CALCULI, STONE BASKET EXTRACTION LEFT URETERAL CALCULI  AND BILATERAL URETERAL STENT EXCHANGE;  Surgeon: Malen Gauze, MD;  Location: [facility];  Service: Urology;  Laterality: Bilateral;  . HOLMIUM LASER APPLICATION Bilateral 08/20/2016   Procedure: HOLMIUM LASER APPLICATION;  Surgeon: Malen Gauze, MD;  Location: [facility];  Service: Urology;  Laterality: Bilateral;    Prior to Admission medications   Medication Sig Start Date End Date Taking? Authorizing Provider  aspirin EC 81 MG tablet Take 81 mg by mouth every morning.    Yes [provider]  lisinopril (PRINIVIL,ZESTRIL) 10 MG tablet Take 1 tablet by mouth every morning.    Yes [provider]  metFORMIN (GLUCOPHAGE) 500 MG tablet Take 1,000 mg by mouth every morning.  02/03/18  Yes [provider]  tamsulosin (FLOMAX) 0.4 MG CAPS capsule Take 0.4 mg by mouth every evening.    Yes [provider]    Current Facility-Administered Medications  Medication Dose Route Frequency Provider Last  Rate Last Dose  . 0.45 % NaCl with KCl 20 mEq / L infusion   Intravenous Continuous Erick BlinksMemon, Jehanzeb, MD      . enoxaparin (LOVENOX) injection 40 mg  40 mg Subcutaneous Q24H Erick BlinksMemon, Jehanzeb, MD   40 mg at 04/10/18 2223  . insulin aspart (novoLOG) injection 0-9 Units  0-9 Units Subcutaneous TID WC Haydee Monicaavid, Rachal A, MD   1 Units at 04/10/18 1847  . insulin glargine (LANTUS) injection 20 Units  20 Units Subcutaneous Daily Erick BlinksMemon, Jehanzeb, MD   20 Units at 04/11/18 1153  . phenol (CHLORASEPTIC) mouth spray 1 spray  1 spray Mouth/Throat PRN Erick BlinksMemon, Jehanzeb, MD   1 spray at 04/08/18 1803  . saccharomyces boulardii (FLORASTOR) capsule 250 mg  250 mg Oral BID Erick BlinksMemon, Jehanzeb, MD   250 mg at 04/11/18 1152  . tamsulosin (FLOMAX) capsule 0.4 mg  0.4 mg Oral QPC supper Erick BlinksMemon, Jehanzeb, MD   0.4 mg at 04/10/18 1847  . vancomycin (VANCOCIN) 50 mg/mL oral solution 125 mg  125 mg Oral QID Erick BlinksMemon, Jehanzeb, MD   125 mg at 04/11/18 1153    Allergies as of 04/07/2018  . (No Known Allergies)    Family History  Family history unknown: Yes    Social History   Socioeconomic History  . Marital status: Single    Spouse name: Not on file  . Number of children: Not on file  . Years of education: Not on file  . Highest education level: Not on file  Occupational History  . Not on file  Social Needs  . Financial resource strain: Not on file  . Food insecurity:    Worry: Not on file    Inability: Not on file  . Transportation needs:    Medical: Not on file    Non-medical: Not on file  Tobacco Use  . Smoking status: Never Smoker  . Smokeless tobacco: Never Used  Substance and Sexual Activity  . Alcohol use: No  . Drug use: No  . Sexual activity: Never    Birth control/protection: None  Lifestyle  . Physical activity:    Days per week: Not on file    Minutes per session: Not on file  . Stress: Not on file  Relationships  . Social connections:    Talks on phone: Not on file    Gets together: Not on  file    Attends religious service: Not on file    Active member of club or organization: Not on file    Attends meetings of clubs or organizations: Not on file    Relationship status: Not on file  . Intimate partner violence:    Fear of current or ex partner: Not on file    Emotionally abused: Not on file    Physically abused: Not on file    Forced sexual activity: Not on file  Other Topics Concern  . Not on file  Social History Narrative  . Not on file    Review of Systems: LIMITED DUE TO MOSTLY NON-COMUNICATIVE GI: See history of present illness.  Physical Exam: Vital signs in last 24 hours: Temp:  [97.5 F (36.4 C)-99.3 F (37.4 C)] 97.5 F (36.4 C) (12/27 0544) Pulse Rate:  [83-90] 85 (12/27 0544) Resp:  [  18] 18 (12/27 0544) BP: (107-113)/(66-69) 107/69 (12/27 0544) SpO2:  [97 %-98 %] 97 % (12/27 0544) Weight:  [71.8 kg] 71.8 kg (12/27 0544) Last BM Date: 04/10/18 General:   Alert,  Well-developed, well-nourished, pleasant and cooperative in NAD Head:  Normocephalic and atraumatic. Eyes:  Sclera clear, no icterus. Conjunctiva pink. Ears:  Normal auditory acuity. Neck:  Supple; no masses or thyromegaly. Lungs:  Clear throughout to auscultation.  No wheezes, crackles, or rhonchi. No acute distress. Heart:  Regular rate and rhythm; no murmurs, clicks, rubs,  or gallops. Abdomen:  Abdomen nontender. Mildly distended but soft and no sign of abdominal pain. No masses, hepatosplenomegaly or hernias noted. Normal bowel sounds, without guarding, and without rebound.   Rectal:  Deferred.   Msk:  Symmetrical without gross deformities. Pulses:  Normal bilateral DP pulses noted. Extremities:  Without clubbing or edema. Neurologic:  Alert and  oriented x4;  grossly normal neurologically. Psych:  Alert and cooperative. Normal mood and affect.  Intake/Output from previous day: 12/26 0701 - 12/27 0700 In: 1562 [P.O.:720; I.V.:842] Out: 2100 [Urine:2100] Intake/Output this  shift: No intake/output data recorded.  Lab Results: Recent Labs    04/09/18 0533 04/10/18 0452 04/11/18 0437  WBC 12.5* 16.1* 13.8*  HGB 11.0* 10.7* 10.3*  HCT 32.8* 33.4* 33.4*  PLT 210 212 223   BMET Recent Labs    04/09/18 0533 04/10/18 0452 04/11/18 0437  NA 144 144 143  K 3.4* 3.2* 3.6  CL 108 109 109  CO2 28 30 29   GLUCOSE 430* 153* 91  BUN 85* 45* 25*  CREATININE 3.06* 1.49* 1.17  CALCIUM 8.2* 8.1* 7.7*   LFT No results for input(s): PROT, ALBUMIN, AST, ALT, ALKPHOS, BILITOT, BILIDIR, IBILI in the last 72 hours. PT/INR No results for input(s): LABPROT, INR in the last 72 hours. Hepatitis Panel No results for input(s): HEPBSAG, HCVAB, HEPAIGM, HEPBIGM in the last 72 hours. C-Diff No results for input(s): CDIFFTOX in the last 72 hours.  Studies/Results: Dg Abd 1 View  Result Date: 04/10/2018 CLINICAL DATA:  Abdominal distention EXAM: ABDOMEN - 1 VIEW COMPARISON:  CT abdomen pelvis of 04/07/2017 FINDINGS: There is considerable gaseous distention of large and small bowel. This may be due to ileus, but a view of the colonic distension, a distal colonic lesion or stricture can not be excluded. There do appear to be renal calculi present. Several metallic devices are noted in the region of the prostate. IMPRESSION: Gaseous distention of large and small bowel. Can not excluded distal colonic lesion or stricture. Renal calculi. Electronically Signed   By: Dwyane DeePaul  Barry M.D.   On: 04/10/2018 12:58    Impression: Essentially noncommunicative 67 year old male who presented with acute kidney injury secondary to bladder outlet obstruction, now resolved with Foley placement.  Patient was also complaining of diarrhea for about 2 weeks and C. difficile quick scan showed positive for C. difficile.  He was started on vancomycin treatment.  This is his first episode of C. difficile.  He was having quite significant abdominal distention in the setting of chronic abdominal distention.   Rectal tube was placed with noted gaseous decompression and improvement.  His abdomen seems somewhat distended today although his family states it is at baseline for him.  He does not appear in distress.  He shakes his head no and grunts when asked about abdominal pain.  He does not appear in distress or in pain on abdominal exam.  Bowel sounds are positive.  We previously saw him  for chronic colon distention.  He likely developed an ileus with C. difficile infection which seems to have resolved given passage of stools.  His diarrhea has improved as he is only had one bowel movement today.  No hematochezia or melena.  No other GI concerns.  Of note, the patient is not toxic.  Heart rate and blood pressure are stable, no elevated lactic acid.  White blood cell count improving.  Case was discussed with Dr. Darrick Penna  Plan: 1. Continue C. difficile treatment with vancomycin for a course of 10 days total (125 mg qid x 10 days) 2. Supportive measures 3. Anticipate discharge in 24 hours   Thank you for allowing [place] to participate in the care of Corby S Bazzano  Eric Gill, DNP, AGNP-C Adult & Gerontological Nurse Practitioner [facility]    LOS: 4 days     04/11/2018, 11:58 AM

## 2018-04-11 NOTE — Progress Notes (Signed)
  Subjective: Caregiver reports that his abdomen is much less distended.  She is helping him eat.  He is at his baseline.  Objective: Vital signs in last 24 hours: Temp:  [97.5 F (36.4 C)-99.3 F (37.4 C)] 97.5 F (36.4 C) (12/27 0544) Pulse Rate:  [83-90] 85 (12/27 0544) Resp:  [18] 18 (12/27 0544) BP: (107-113)/(66-69) 107/69 (12/27 0544) SpO2:  [97 %-98 %] 97 % (12/27 0544) Weight:  [71.8 kg] 71.8 kg (12/27 0544) Last BM Date: 04/10/18  Intake/Output from previous day: 12/26 0701 - 12/27 0700 In: 1562 [P.O.:720; I.V.:842] Out: 2100 [Urine:2100] Intake/Output this shift: No intake/output data recorded.  General appearance: alert and no distress GI: Soft, less distended.  No rigidity noted.  Active bowel sounds appreciated.  Lab Results:  Recent Labs    04/10/18 0452 04/11/18 0437  WBC 16.1* 13.8*  HGB 10.7* 10.3*  HCT 33.4* 33.4*  PLT 212 223   BMET Recent Labs    04/10/18 0452 04/11/18 0437  NA 144 143  K 3.2* 3.6  CL 109 109  CO2 30 29  GLUCOSE 153* 91  BUN 45* 25*  CREATININE 1.49* 1.17  CALCIUM 8.1* 7.7*   PT/INR No results for input(s): LABPROT, INR in the last 72 hours.  Studies/Results: Dg Abd 1 View  Result Date: 04/10/2018 CLINICAL DATA:  Abdominal distention EXAM: ABDOMEN - 1 VIEW COMPARISON:  CT abdomen pelvis of 04/07/2017 FINDINGS: There is considerable gaseous distention of large and small bowel. This may be due to ileus, but a view of the colonic distension, a distal colonic lesion or stricture can not be excluded. There do appear to be renal calculi present. Several metallic devices are noted in the region of the prostate. IMPRESSION: Gaseous distention of large and small bowel. Can not excluded distal colonic lesion or stricture. Renal calculi. Electronically Signed   By: Dwyane DeePaul  Barry M.D.   On: 04/10/2018 12:58    Anti-infectives: Anti-infectives (From admission, onward)   Start     Dose/Rate Route Frequency Ordered Stop   04/08/18  2015  cefTRIAXone (ROCEPHIN) 1 g in sodium chloride 0.9 % 100 mL IVPB  Status:  Discontinued     1 g 200 mL/hr over 30 Minutes Intravenous Every 24 hours 04/08/18 2001 04/09/18 1426   04/08/18 1400  vancomycin (VANCOCIN) 50 mg/mL oral solution 125 mg     125 mg Oral 4 times daily 04/08/18 1257 04/18/18 1359   04/08/18 1000  piperacillin-tazobactam (ZOSYN) IVPB 3.375 g  Status:  Discontinued     3.375 g 12.5 mL/hr over 240 Minutes Intravenous Every 12 hours 04/08/18 0740 04/08/18 2001   04/07/18 2245  piperacillin-tazobactam (ZOSYN) IVPB 3.375 g     3.375 g 12.5 mL/hr over 240 Minutes Intravenous  Once 04/07/18 2232 04/08/18 [id number]      Assessment/Plan: Impression: C. difficile colitis.  Abdominal distention resolving.  This may be a result of his C. difficile infection.  No acute surgical intervention needed.  Encouraging that leukocytosis is resolving.  LOS: 4 days    Franky MachoMark Sylvie Mifsud 04/11/2018

## 2018-04-11 NOTE — Care Management (Signed)
CM attempted to contact POA, [name], [facility]. Got no answer, was unable to leave VM.

## 2018-04-11 NOTE — Care Management Important Message (Signed)
Important Message  Patient Details  Name: [name] MRN: [id number] Date of Birth: 1951-02-11   Medicare Important Message Given:  Yes    Malcolm MetroChildress, Adolph Clutter Demske, RN 04/11/2018, 3:45 PM

## 2018-04-12 DIAGNOSIS — N179 Acute kidney failure, unspecified: Secondary | ICD-10-CM | POA: Diagnosis not present

## 2018-04-12 DIAGNOSIS — R140 Abdominal distension (gaseous): Secondary | ICD-10-CM | POA: Diagnosis not present

## 2018-04-12 DIAGNOSIS — A0472 Enterocolitis due to Clostridium difficile, not specified as recurrent: Secondary | ICD-10-CM | POA: Diagnosis not present

## 2018-04-12 DIAGNOSIS — N1330 Unspecified hydronephrosis: Secondary | ICD-10-CM | POA: Diagnosis not present

## 2018-04-12 LAB — BASIC METABOLIC PANEL
Anion gap: 6 (ref 5–15)
BUN: 17 mg/dL (ref 8–23)
CO2: 25 mmol/L (ref 22–32)
Calcium: 7.3 mg/dL — ABNORMAL LOW (ref 8.9–10.3)
Chloride: 104 mmol/L (ref 98–111)
Creatinine, Ser: 1.1 mg/dL (ref 0.61–1.24)
GFR calc Af Amer: >60 mL/min (ref >60)
GFR calc non Af Amer: >60 mL/min (ref >60)
Glucose, Bld: 158 mg/dL — ABNORMAL HIGH (ref 70–99)
Potassium: 3.7 mmol/L (ref 3.5–5.1)
Sodium: 135 mmol/L (ref 135–145)

## 2018-04-12 LAB — GLUCOSE, CAPILLARY
Glucose-Capillary: 100 mg/dL — ABNORMAL HIGH (ref 70–99)
Glucose-Capillary: 146 mg/dL — ABNORMAL HIGH (ref 70–99)

## 2018-04-12 LAB — MAGNESIUM: Magnesium: 1.2 mg/dL — ABNORMAL LOW (ref 1.7–2.4)

## 2018-04-12 MED ORDER — VANCOMYCIN 50 MG/ML ORAL SOLUTION: 125.0000 mg | Freq: Four times a day (QID) | ORAL | 0 refills | Status: DC

## 2018-04-12 MED ORDER — TAMSULOSIN HCL 0.4 MG PO CAPS: 0.4000 mg | ORAL_CAPSULE | Freq: Every evening | ORAL | 1 refills | Status: DC

## 2018-04-12 MED ORDER — SACCHAROMYCES BOULARDII 250 MG PO CAPS: 250.0000 mg | ORAL_CAPSULE | Freq: Two times a day (BID) | ORAL | 0 refills | Status: DC

## 2018-04-12 NOTE — Care Management Note (Signed)
Case Management Note  Patient Details  Name: [name] MRN: [id number] Date of Birth: 04-08-1951  Subjective/Objective: Patient to be discharged per MD order. Orders in place for home health services. Patient has legal guardian [place]. CMS Medicare.gov Compare Post Acute Care list reviewed with her. They have previously selected [facility]. [facility]. Patient will require intermittent catheterization. Patient and family educated on process and they have been given supplies by nursing staff that will last through Sunday. I have informed Jermaine of this need. No DME needs. Guardian to transport.                   Action/Plan:   Expected Discharge Date:  04/12/18               Expected Discharge Plan:  Home w Home Health Services  In-House Referral:  NA  Discharge planning Services  CM Consult  Post Acute Care Choice:  Home Health Choice offered to:  S[facility]ling, [facility] / Guardian  DME Arranged:    DME Agency:     HH Arranged:  RN HH Agency:  [facility]  Status of Service:  Completed, signed off  If discussed at Long Length of Stay Meetings, dates discussed:    Additional Comments:  Virgel ManifoldJosh A Ethelyne Erich, RN 04/12/2018, 3:19 PM

## 2018-04-12 NOTE — Discharge Summary (Addendum)
Physician Discharge Summary  [name]:[id number] DOB: 01-Mar-1951 DOA: 04/07/2018  PCP: Pearson Grippe, MD  Admit date: 04/07/2018 Discharge date: 04/12/2018  Admitted From: Home Disposition: Home  Recommendations for Outpatient Follow-up:  1. Follow up with PCP in 1-2 weeks 2. Please obtain BMP/CBC in one week 3. Follow-up scheduled with urology on 1/10  Home Health: Home health RN Equipment/Devices: Supplies for in and out urinary catheterization  Discharge Condition: Stable CODE STATUS: Full code Diet recommendation: Heart healthy, carb modified  Brief/Interim Summary: 67 year old male with a history of cognitive deficits, diabetes, hypertension, presented to the hospital with elevated blood sugars, diarrhea and dehydration.  He was found to have acute kidney injury secondary to volume depletion as well as bladder outlet obstruction.  Imaging indicated distended bladder, bilateral hydronephrosis.  Foley catheter was placed with expulsion of 2500 cc of urine.  Creatinine began to trend down.  Stool tested positive for C. difficile and he was treated with vancomycin.  Discharge Diagnoses:  Principal Problem:   AKI (acute kidney injury) (HCC) Active Problems:   Urinary retention   Intellectual disability   HTN (hypertension)   Diabetes mellitus type 2, controlled (HCC)   BPH (benign prostatic hyperplasia)   Diarrhea   Nonverbal   Pneumoperitoneum of unknown etiology   C. difficile diarrhea   Bilateral hydronephrosis   Pressure injury of skin   Abdominal distention  1. Acute kidney injury.  Related to combination of prerenal from GI losses as well as bladder outlet obstruction.  Patient had Foley catheter in place and expelled 2500 cc of urine.  Foley catheter has been continued and creatinine has since normalized.  CT scan was reviewed with urology who recommended outpatient urology follow-up.  Urine culture did not show any specific growth.  Was recommended to the  family to continue Foley catheter until follow-up with urology.  Family is concerned the patient will pull Foley catheter out after discharge and would rather intermittently catheterize him through the day.  They have been advised that he will need catheterization at least 3-4 times a day.  Family was instructed on how to catheterize patient and demonstrated good technique prior to discharge.  They have been set up with home health care for further supplies. 2. Urinary retention.  Unclear etiology.  Resolved with Foley catheter placement for decompression. 3. C. difficile diarrhea.  Patient tested positive for C. difficile.  He was treated with oral vancomycin.  Overall diarrhea has improved and is having 1-2 bowel movements per day.  He was noted to have significant abdominal distention.  He was seen by both GI and general surgery and it was noted that patient chronically has distended abdomen.  He is still passing his bowels, does not have any vomiting.  At this point, this appears to be benign. 4. Hypertension.  ACE inhibitor was held on admission due to renal failure.  Since creatinine has normalized, this can be resumed on discharge. 5. Diabetes.  Metformin held on admission due to renal failure, since creatinine has normalized this can be resumed.  A1c of 8.0. 6. Pneumoperitoneum of unknown etiology.  Possibly related to C. difficile.  Followed by general surgery and not felt to have an indication for surgery.  Discharge Instructions  Discharge Instructions    Diet - low sodium heart healthy   Complete by:  As directed    Increase activity slowly   Complete by:  As directed      Allergies as of 04/12/2018   No  Known Allergies     Medication List    TAKE these medications   aspirin EC 81 MG tablet Take 81 mg by mouth every morning.   lisinopril 10 MG tablet Commonly known as:  PRINIVIL,ZESTRIL Take 1 tablet by mouth every morning.   metFORMIN 500 MG tablet Commonly known as:   GLUCOPHAGE Take 1,000 mg by mouth every morning.   saccharomyces boulardii 250 MG capsule Commonly known as:  FLORASTOR Take 1 capsule (250 mg total) by mouth 2 (two) times daily.   tamsulosin 0.4 MG Caps capsule Commonly known as:  FLOMAX Take 1 capsule (0.4 mg total) by mouth every evening.   vancomycin 50 mg/mL  oral solution Commonly known as:  VANCOCIN Take 2.5 mLs (125 mg total) by mouth 4 (four) times daily.      Follow-up Information    Burman FosterFleck, Bree Tharpe, NP Follow up on 04/25/2018.   Specialty:  Urology Why:  9:45am Contact information: [address] [place] [address] [phone]          No Known Allergies  Consultations:  General surgery  Gastroenterology   Procedures/Studies: Ct Abdomen Pelvis Wo Contrast  Addendum Date: 04/07/2018   ADDENDUM REPORT: 04/07/2018 22:17 ADDENDUM: These results were called by telephone at the time of interpretation on 04/07/2018 at 10:15 pm to Dr. Terie PurserACHAL DAVID , who verbally acknowledged these results. Per discussion with Dr. Onalee Huaavid, the patient has not been recently catheterized. In that context, the air within the urinary bladder is more suggestive of infection. There is gaseous distention of the colon, but no findings of acute colitis. Electronically Signed   By: Deatra RobinsonKevin  Herman M.D.   On: 04/07/2018 22:17   Result Date: 04/07/2018 CLINICAL DATA:  Diarrhea EXAM: CT ABDOMEN AND PELVIS WITHOUT CONTRAST TECHNIQUE: Multidetector CT imaging of the abdomen and pelvis was performed following the standard protocol without IV contrast. COMPARISON:  CT abdomen pelvis 06/19/2016 FINDINGS: LOWER CHEST: Bibasilar atelectasis. HEPATOBILIARY: Left lobe of the liver is absent. Numerous gallstones. PANCREAS: The pancreatic parenchymal contours are normal and there is no ductal dilatation. There is no peripancreatic fluid collection. SPLEEN: Normal. ADRENALS/URINARY TRACT: --Adrenal glands: Normal. --Right kidney/ureter: Severe  hydroureteronephrosis. No ureteral stone. --Left kidney/ureter: Severe hydroureteronephrosis. Layering stones dependently within the renal collecting system and renal pelvis. No ureteral stone. --Urinary bladder: The urinary bladder is severely dilated with multifocal anti-dependent gas. STOMACH/BOWEL: There are multiple foci of free air within the abdomen, most evident beneath the right hemidiaphragm. VASCULAR/LYMPHATIC: Normal course and caliber of the major abdominal vessels. No abdominal or pelvic lymphadenopathy. REPRODUCTIVE: Surgical clips adjacent to the prostate. MUSCULOSKELETAL. Severe L4-5 spinal canal stenosis, unchanged. OTHER: None. IMPRESSION: 1. Small volume pneumoperitoneum, most evident beneath the right hemidiaphragm, suggesting bowel perforation. No identifiable source. 2. Severely dilated urinary bladder with severe bilateral hydroureteronephrosis. Multiple left renal calculi, but no obstructing stones. 3. Gas within the urinary bladder is probably secondary to catheterization. 4. Severe L4-5 spinal canal stenosis. Electronically Signed: By: Deatra RobinsonKevin  Herman M.D. On: 04/07/2018 22:09   Dg Tibia/fibula Left  Result Date: 04/03/2018 CLINICAL DATA:  Larey SeatFell out of bed several days ago, now with left lower leg pain and bruising EXAM: LEFT TIBIA AND FIBULA - 2 VIEW COMPARISON:  None FINDINGS: The left tibia and fibula appear intact and normally aligned. No acute fracture is seen. No periosteal reaction is noted. IMPRESSION: Negative. Electronically Signed   By: Dwyane DeePaul  Barry M.D.   On: 04/03/2018 16:44   Dg Tibia/fibula Right  Result Date: 04/03/2018 CLINICAL DATA:  Larey Seat out of bed several days ago, lower leg pain and bruising EXAM: RIGHT TIBIA AND FIBULA - 2 VIEW COMPARISON:  None. FINDINGS: The right tibia and fibula are intact and normally aligned. No fracture is seen. No periosteal reaction is noted. IMPRESSION: Negative. Electronically Signed   By: Dwyane Dee M.D.   On: 04/03/2018 16:46    Dg Abd 1 View  Result Date: 04/10/2018 CLINICAL DATA:  Abdominal distention EXAM: ABDOMEN - 1 VIEW COMPARISON:  CT abdomen pelvis of 04/07/2017 FINDINGS: There is considerable gaseous distention of large and small bowel. This may be due to ileus, but a view of the colonic distension, a distal colonic lesion or stricture can not be excluded. There do appear to be renal calculi present. Several metallic devices are noted in the region of the prostate. IMPRESSION: Gaseous distention of large and small bowel. Can not excluded distal colonic lesion or stricture. Renal calculi. Electronically Signed   By: Dwyane Dee M.D.   On: 04/10/2018 12:58   Dg Knee Complete 4 Views Left  Result Date: 04/03/2018 CLINICAL DATA:  Larey Seat out of bed several days ago, left knee pain EXAM: LEFT KNEE - COMPLETE 4+ VIEW COMPARISON:  None. FINDINGS: The left knee joint spaces appear normal. No fracture is seen. No joint effusion is noted. IMPRESSION: Negative. Electronically Signed   By: Dwyane Dee M.D.   On: 04/03/2018 16:45   Dg Abd Acute W/chest  Result Date: 04/07/2018 CLINICAL DATA:  Patient has not eaten days. Diarrhea and weakness. EXAM: DG ABDOMEN ACUTE W/ 1V CHEST COMPARISON:  CT 06/19/2016 FINDINGS: Low lung volumes are identified on the frontal view of the chest with top normal size cardiac silhouette. Nonaneurysmal appearing thoracic aorta is noted. There is atelectasis at the right base. No overt pulmonary edema, pleural effusion or pneumothorax. No acute pulmonary consolidation. There is no free air beneath the diaphragm. Numerous renal calculi project over the left hemiabdomen, some possibly in the expected location of the renal pelvis. Mild diffuse gaseous distention of large bowel is noted without definite evidence of mechanical bowel obstruction. Surgical clips project over the lower pelvis with stable phlebolith in the left hemipelvis. Small amount of retained stool is seen in the rectum. Thoracolumbar  spondylosis without acute osseous abnormality. IMPRESSION: 1. Low lung volumes with right basilar atelectasis. 2. Mild diffuse gaseous distention of large bowel without definite evidence of mechanical bowel obstruction. There is no free air. 3. Left-sided nephrolithiasis, some potentially within the expected location of the renal pelvis. Electronically Signed   By: Tollie Eth M.D.   On: 04/07/2018 17:25       Subjective: Overall bowel movements have improved.  Denies any complaints.  Discharge Exam: Vitals:   04/11/18 2000 04/11/18 2218 04/12/18 0513 04/12/18 0627  BP:  98/61 (!) 109/59   Pulse:  95 94   Resp:  18 18   Temp:  98.5 F (36.9 C) 98.4 F (36.9 C)   TempSrc:  Oral Oral   SpO2: 98% 96% 95%   Weight:    71.8 kg  Height:        General: Pt is alert, awake, not in acute distress Cardiovascular: RRR, S1/S2 +, no rubs, no gallops Respiratory: CTA bilaterally, no wheezing, no rhonchi Abdominal: Soft, NT, distended, bowel sounds + Extremities: no edema, no cyanosis    The results of significant diagnostics from this hospitalization (including imaging, microbiology, ancillary and laboratory) are listed below for reference.  Microbiology: Recent Results (from the past 240 hour(s))  Culture, Urine     Status: None   Collection Time: 04/08/18 10:50 AM  Result Value Ref Range Status   Specimen Description   Final    URINE, CATHETERIZED Performed at [facility], [address] [place], [place] [address]    Special Requests   Final    NONE Performed at [facility], [address] [place], [place] [address]    Culture   Final    Multiple bacterial morphotypes present, none predominant. Suggest appropriate recollection if clinically indicated.   Report Status 04/09/2018 FINAL  Final  Gastrointestinal Panel by PCR , Stool     Status: None   Collection Time: 04/08/18 11:04 AM  Result Value Ref Range Status   Campylobacter species NOT DETECTED NOT DETECTED Final    Plesimonas shigelloides NOT DETECTED NOT DETECTED Final   Salmonella species NOT DETECTED NOT DETECTED Final   Yersinia enterocolitica NOT DETECTED NOT DETECTED Final   Vibrio species NOT DETECTED NOT DETECTED Final   Vibrio cholerae NOT DETECTED NOT DETECTED Final   Enteroaggregative E coli (EAEC) NOT DETECTED NOT DETECTED Final   Enteropathogenic E coli (EPEC) NOT DETECTED NOT DETECTED Final   Enterotoxigenic E coli (ETEC) NOT DETECTED NOT DETECTED Final   Shiga like toxin producing E coli (STEC) NOT DETECTED NOT DETECTED Final   Shigella/Enteroinvasive E coli (EIEC) NOT DETECTED NOT DETECTED Final   Cryptosporidium NOT DETECTED NOT DETECTED Final   Cyclospora cayetanensis NOT DETECTED NOT DETECTED Final   Entamoeba histolytica NOT DETECTED NOT DETECTED Final   Giardia lamblia NOT DETECTED NOT DETECTED Final   Adenovirus F40/41 NOT DETECTED NOT DETECTED Final   Astrovirus NOT DETECTED NOT DETECTED Final   Norovirus GI/GII NOT DETECTED NOT DETECTED Final   Rotavirus A NOT DETECTED NOT DETECTED Final   Sapovirus (I, II, IV, and V) NOT DETECTED NOT DETECTED Final    Comment: Performed at [facility], [address] [place], [place] [address]  C difficile quick scan w PCR reflex     Status: Abnormal   Collection Time: 04/08/18 11:10 AM  Result Value Ref Range Status   C Diff antigen POSITIVE (A) NEGATIVE Final   C Diff toxin POSITIVE (A) NEGATIVE Final    Comment: CRITICAL RESULT CALLED TO, READ BACK BY AND VERIFIED WITH: MARTIN,D. AT 1254 ON 04/08/2018 BY BAUGHAM,M.    C Diff interpretation Toxin producing C. difficile detected.  Final    Comment: CRITICAL RESULT CALLED TO, READ BACK BY AND VERIFIED WITH: MARTIN,D. AT 1254 ON 04/08/2018 BY BAUGHAM,M. Performed at [facility], [address] [place], [place] [address]      Labs: BNP (last 3 results) No results for input(s): BNP in the last 8760 hours. Basic Metabolic Panel: Recent Labs  Lab 04/08/18 1512  04/09/18 0533 04/10/18 0452 04/11/18 0437 04/12/18 1018  NA 134* 144 144 143 135  K 3.4* 3.4* 3.2* 3.6 3.7  CL 108 108 109 109 104  CO2 19* 28 30 29 25   GLUCOSE 343* 430* 153* 91 158*  BUN 115* 85* 45* 25* 17  CREATININE 5.02* 3.06* 1.49* 1.17 1.10  CALCIUM 7.8* 8.2* 8.1* 7.7* 7.3*  MG  --   --   --   --  1.2*   Liver Function Tests: No results for input(s): AST, ALT, ALKPHOS, BILITOT, PROT, ALBUMIN in the last 168 hours. No results for input(s): LIPASE, AMYLASE in the last 168 hours. No results for input(s): AMMONIA  in the last 168 hours. CBC: Recent Labs  Lab 04/07/18 1631 04/08/18 0555 04/09/18 0533 04/10/18 0452 04/11/18 0437  WBC 15.2* 13.9* 12.5* 16.1* 13.8*  NEUTROABS 13.3*  --   --   --   --   HGB 12.6* 10.6* 11.0* 10.7* 10.3*  HCT 37.3* 31.8* 32.8* 33.4* 33.4*  MCV 85.6 86.6 85.4 89.1 90.3  PLT 207 184 210 212 223   Cardiac Enzymes: No results for input(s): CKTOTAL, CKMB, CKMBINDEX, TROPONINI in the last 168 hours. BNP: Invalid input(s): POCBNP CBG: Recent Labs  Lab 04/11/18 1221 04/11/18 1655 04/11/18 2219 04/12/18 0734 04/12/18 1145  GLUCAP 150* 125* 90 100* 146*   D-Dimer No results for input(s): DDIMER in the last 72 hours. Hgb A1c No results for input(s): HGBA1C in the last 72 hours. Lipid Profile No results for input(s): CHOL, HDL, LDLCALC, TRIG, CHOLHDL, LDLDIRECT in the last 72 hours. Thyroid function studies No results for input(s): TSH, T4TOTAL, T3FREE, THYROIDAB in the last 72 hours.  Invalid input(s): FREET3 Anemia work up No results for input(s): VITAMINB12, FOLATE, FERRITIN, TIBC, IRON, RETICCTPCT in the last 72 hours. Urinalysis    Component Value Date/Time   COLORURINE YELLOW 04/08/2018 1050   APPEARANCEUR TURBID (A) 04/08/2018 1050   LABSPEC 1.006 04/08/2018 1050   PHURINE 6.0 04/08/2018 1050   GLUCOSEU NEGATIVE 04/08/2018 1050   HGBUR LARGE (A) 04/08/2018 1050   BILIRUBINUR NEGATIVE 04/08/2018 1050   KETONESUR NEGATIVE  04/08/2018 1050   PROTEINUR 100 (A) 04/08/2018 1050   UROBILINOGEN 1.0 07/20/2014 1718   NITRITE NEGATIVE 04/08/2018 1050   LEUKOCYTESUR MODERATE (A) 04/08/2018 1050   Sepsis Labs Invalid input(s): PROCALCITONIN,  WBC,  LACTICIDVEN Microbiology Recent Results (from the past 240 hour(s))  Culture, Urine     Status: None   Collection Time: 04/08/18 10:50 AM  Result Value Ref Range Status   Specimen Description   Final    URINE, CATHETERIZED Performed at [facility], [address], [place], [place] [address]    Special Requests   Final    NONE Performed at [facility], [address], [place], [place] [address]    Culture   Final    Multiple bacterial morphotypes present, none predominant. Suggest appropriate recollection if clinically indicated.   Report Status 04/09/2018 FINAL  Final  Gastrointestinal Panel by PCR , Stool     Status: None   Collection Time: 04/08/18 11:04 AM  Result Value Ref Range Status   Campylobacter species NOT DETECTED NOT DETECTED Final   Plesimonas shigelloides NOT DETECTED NOT DETECTED Final   Salmonella species NOT DETECTED NOT DETECTED Final   Yersinia enterocolitica NOT DETECTED NOT DETECTED Final   Vibrio species NOT DETECTED NOT DETECTED Final   Vibrio cholerae NOT DETECTED NOT DETECTED Final   Enteroaggregative E coli (EAEC) NOT DETECTED NOT DETECTED Final   Enteropathogenic E coli (EPEC) NOT DETECTED NOT DETECTED Final   Enterotoxigenic E coli (ETEC) NOT DETECTED NOT DETECTED Final   Shiga like toxin producing E coli (STEC) NOT DETECTED NOT DETECTED Final   Shigella/Enteroinvasive E coli (EIEC) NOT DETECTED NOT DETECTED Final   Cryptosporidium NOT DETECTED NOT DETECTED Final   Cyclospora cayetanensis NOT DETECTED NOT DETECTED Final   Entamoeba histolytica NOT DETECTED NOT DETECTED Final   Giardia lamblia NOT DETECTED NOT DETECTED Final   Adenovirus F40/41 NOT DETECTED NOT DETECTED Final   Astrovirus NOT DETECTED NOT DETECTED Final    Norovirus GI/GII NOT DETECTED NOT DETECTED Final   Rotavirus A NOT DETECTED  NOT DETECTED Final   Sapovirus (I, II, IV, and V) NOT DETECTED NOT DETECTED Final    Comment: Performed at [facility], [address] [place], [place] [address]  C difficile quick scan w PCR reflex     Status: Abnormal   Collection Time: 04/08/18 11:10 AM  Result Value Ref Range Status   C Diff antigen POSITIVE (A) NEGATIVE Final   C Diff toxin POSITIVE (A) NEGATIVE Final    Comment: CRITICAL RESULT CALLED TO, READ BACK BY AND VERIFIED WITH: MARTIN,D. AT 1254 ON 04/08/2018 BY BAUGHAM,M.    C Diff interpretation Toxin producing C. difficile detected.  Final    Comment: CRITICAL RESULT CALLED TO, READ BACK BY AND VERIFIED WITH: MARTIN,D. AT 1254 ON 04/08/2018 BY Ginette Pitman. Performed at [facility], [address] [place], [place] [address]      Time coordinating discharge:  SIGNED:   Erick Blinks, MD  [facility] 04/12/2018, 5:09 PM Pager   If 7PM-7AM, please contact night-coverage [email or web address] Password [email or web address]

## 2018-04-12 NOTE — Progress Notes (Signed)
Patient ID: [name], male   DOB: 08-11-1950, 67 y.o.   MRN: [id number]  Assessment/Plan: Admitted with ACUTE ON CHRONIC ABDOMINAL DISTENTION. CT REVEALED PNEUMOPERITONEUM-ETIOLOGY UNCLEAR AND SEVERE URINARY RETENTION W/ AIR IN BLADDER WALL/CHORNICALLY DILATED COLON. STOOL SENT FOR C DIFF AND WAS POS. HOSPITAL DAY 2 APPEARED MORE DISTENDED AND KUB SHOWED WORSENING DILATION OF THE COLON. RECTAL TUBE PLACED AND PT'S EXAM RETURNED TO BASELINE. EXAM HAS RETURNED TO BASELINE @ TO FAMILY.  PLAN: 1. OK TO D/C HOME. 2. COMPLETE VANCOMYCIN FOR 10 DAYS. 3. AGREE WITH UROLOGY REFERRAL AS OUTPT.    Subjective: Since I last evaluated the patient HE HAD BM: 3 AM, 5 AM, AND @9  AM. ALL SOFT @ TO NURSING/FAMILY. FAMILY FEELS ABDOMINAL DISTENSION IS BACK TO BASELINE.  Objective: Vital signs in last 24 hours: Vitals:   04/11/18 2218 04/12/18 0513  BP: 98/61 (!) 109/59  Pulse: 95 94  Resp: 18 18  Temp: 98.5 F (36.9 C) 98.4 F (36.9 C)  SpO2: 96% 95%   General appearance: alert, cooperative and no distress Resp: clear to auscultation bilaterally Cardio: regular rate and rhythm GI: soft, NON-tender, DISTENDED; bowel sounds normal  Lab Results:  K 3.7 Cr 1.10  Studies/Results: No results found.  Medications: I have reviewed the patient's current medications.

## 2018-04-12 NOTE — Progress Notes (Signed)
Intermittent cath procedure demonstrated to [facility] and pt's other sister who is in the room. Pt will be staying with Ms. Long with the other sister, who has experience with intermittent caths living close by.  Both verbalize understanding.  They have been given written instructions and enough supplies to last them through Sunday in case [facility] can't see them until Monday.

## 2018-04-12 NOTE — Progress Notes (Signed)
  Subjective: No complaints.  Eating regular diet.  Objective: Vital signs in last 24 hours: Temp:  [98.4 F (36.9 C)-98.6 F (37 C)] 98.4 F (36.9 C) (12/28 0513) Pulse Rate:  [94-98] 94 (12/28 0513) Resp:  [18] 18 (12/28 0513) BP: (98-110)/(58-61) 109/59 (12/28 0513) SpO2:  [95 %-98 %] 95 % (12/28 0513) Weight:  [71.8 kg] 71.8 kg (12/28 0627) Last BM Date: 04/11/18  Intake/Output from previous day: 12/27 0701 - 12/28 0700 In: 1706.2 [P.O.:240; I.V.:1466.2] Out: 3300 [Urine:3300] Intake/Output this shift: No intake/output data recorded.  General appearance: cooperative and no distress GI: Soft, distended, not tense.  No rigidity, tenderness.  BS active.  Lab Results:  Recent Labs    04/10/18 0452 04/11/18 0437  WBC 16.1* 13.8*  HGB 10.7* 10.3*  HCT 33.4* 33.4*  PLT 212 223   BMET Recent Labs    04/10/18 0452 04/11/18 0437  NA 144 143  K 3.2* 3.6  CL 109 109  CO2 30 29  GLUCOSE 153* 91  BUN 45* 25*  CREATININE 1.49* 1.17  CALCIUM 8.1* 7.7*   PT/INR No results for input(s): LABPROT, INR in the last 72 hours.  Studies/Results: Dg Abd 1 View  Result Date: 04/10/2018 CLINICAL DATA:  Abdominal distention EXAM: ABDOMEN - 1 VIEW COMPARISON:  CT abdomen pelvis of 04/07/2017 FINDINGS: There is considerable gaseous distention of large and small bowel. This may be due to ileus, but a view of the colonic distension, a distal colonic lesion or stricture can not be excluded. There do appear to be renal calculi present. Several metallic devices are noted in the region of the prostate. IMPRESSION: Gaseous distention of large and small bowel. Can not excluded distal colonic lesion or stricture. Renal calculi. Electronically Signed   By: Dwyane DeePaul  Barry M.D.   On: 04/10/2018 12:58    Anti-infectives: Anti-infectives (From admission, onward)   Start     Dose/Rate Route Frequency Ordered Stop   04/08/18 2015  cefTRIAXone (ROCEPHIN) 1 g in sodium chloride 0.9 % 100 mL IVPB   Status:  Discontinued     1 g 200 mL/hr over 30 Minutes Intravenous Every 24 hours 04/08/18 2001 04/09/18 1426   04/08/18 1400  vancomycin (VANCOCIN) 50 mg/mL oral solution 125 mg     125 mg Oral 4 times daily 04/08/18 1257 04/18/18 1359   04/08/18 1000  piperacillin-tazobactam (ZOSYN) IVPB 3.375 g  Status:  Discontinued     3.375 g 12.5 mL/hr over 240 Minutes Intravenous Every 12 hours 04/08/18 0740 04/08/18 2001   04/07/18 2245  piperacillin-tazobactam (ZOSYN) IVPB 3.375 g     3.375 g 12.5 mL/hr over 240 Minutes Intravenous  Once 04/07/18 2232 04/08/18 [id number]      Assessment/Plan: Imp:  C difficile colitis, resolving.  Abdominal distension appears benign.  No surgical intervention warranted.  Will follow peripherally with you.  LOS: 5 days    Franky MachoMark Bon Dowis 04/12/2018

## 2018-04-26 ENCOUNTER — Other Ambulatory Visit (HOSPITAL_COMMUNITY)
Admission: RE | Admit: 2018-04-26 | Discharge: 2018-04-26 | Disposition: A | Discharge status: Home / Self Care | Payer: Medicare Other | Source: Ambulatory Visit | Attending: Urology | Admitting: Urology

## 2018-04-26 DIAGNOSIS — K2900 Acute gastritis without bleeding: Secondary | ICD-10-CM | POA: Insufficient documentation

## 2018-04-26 LAB — URINALYSIS, ROUTINE W REFLEX MICROSCOPIC
Bilirubin Urine: NEGATIVE
GLUCOSE, UA: NEGATIVE mg/dL
Ketones, ur: NEGATIVE mg/dL
Nitrite: POSITIVE — AB
Protein, ur: NEGATIVE mg/dL
RBC / HPF: >50 RBC/hpf — ABNORMAL HIGH (ref 0–5)
Specific Gravity, Urine: 1.005 (ref 1.005–1.030)
WBC, UA: >50 WBC/hpf — ABNORMAL HIGH (ref 0–5)
pH: 7 (ref 5.0–8.0)

## 2018-04-28 LAB — URINE CULTURE: Culture: >=100000 — AB

## 2018-05-06 ENCOUNTER — Other Ambulatory Visit (HOSPITAL_COMMUNITY): Payer: Self-pay | Admitting: Urology

## 2018-05-06 ENCOUNTER — Other Ambulatory Visit: Payer: Self-pay | Admitting: Urology

## 2018-05-06 DIAGNOSIS — N130 Hydronephrosis with ureteropelvic junction obstruction: Secondary | ICD-10-CM

## 2018-05-23 ENCOUNTER — Ambulatory Visit (HOSPITAL_COMMUNITY)
Admission: RE | Admit: 2018-05-23 | Discharge: 2018-05-23 | Disposition: A | Discharge status: Home / Self Care | Payer: Medicare Other | Source: Ambulatory Visit | Attending: Urology | Admitting: Urology

## 2018-05-23 DIAGNOSIS — N130 Hydronephrosis with ureteropelvic junction obstruction: Secondary | ICD-10-CM

## 2018-05-24 ENCOUNTER — Inpatient Hospital Stay (HOSPITAL_COMMUNITY)
Admission: AD | Admit: 2018-05-24 | Discharge: 2018-05-28 | DRG: 871 | Disposition: A | Discharge status: Home Health | Payer: Medicare Other | Source: Other Acute Inpatient Hospital | Attending: Internal Medicine | Admitting: Internal Medicine

## 2018-05-24 ENCOUNTER — Encounter (HOSPITAL_COMMUNITY): Payer: Self-pay | Admitting: Internal Medicine

## 2018-05-24 ENCOUNTER — Other Ambulatory Visit: Payer: Self-pay

## 2018-05-24 ENCOUNTER — Inpatient Hospital Stay (HOSPITAL_COMMUNITY): Payer: Medicare Other

## 2018-05-24 DIAGNOSIS — F79 Unspecified intellectual disabilities: Secondary | ICD-10-CM

## 2018-05-24 DIAGNOSIS — N401 Enlarged prostate with lower urinary tract symptoms: Secondary | ICD-10-CM | POA: Diagnosis present

## 2018-05-24 DIAGNOSIS — Z792 Long term (current) use of antibiotics: Secondary | ICD-10-CM | POA: Diagnosis not present

## 2018-05-24 DIAGNOSIS — R188 Other ascites: Secondary | ICD-10-CM | POA: Diagnosis present

## 2018-05-24 DIAGNOSIS — D649 Anemia, unspecified: Secondary | ICD-10-CM | POA: Diagnosis present

## 2018-05-24 DIAGNOSIS — Z79899 Other long term (current) drug therapy: Secondary | ICD-10-CM

## 2018-05-24 DIAGNOSIS — K82A1 Gangrene of gallbladder in cholecystitis: Secondary | ICD-10-CM | POA: Diagnosis present

## 2018-05-24 DIAGNOSIS — R319 Hematuria, unspecified: Secondary | ICD-10-CM

## 2018-05-24 DIAGNOSIS — K810 Acute cholecystitis: Secondary | ICD-10-CM | POA: Diagnosis present

## 2018-05-24 DIAGNOSIS — I10 Essential (primary) hypertension: Secondary | ICD-10-CM | POA: Diagnosis not present

## 2018-05-24 DIAGNOSIS — E118 Type 2 diabetes mellitus with unspecified complications: Secondary | ICD-10-CM | POA: Diagnosis not present

## 2018-05-24 DIAGNOSIS — Z8379 Family history of other diseases of the digestive system: Secondary | ICD-10-CM

## 2018-05-24 DIAGNOSIS — Z7982 Long term (current) use of aspirin: Secondary | ICD-10-CM

## 2018-05-24 DIAGNOSIS — Z23 Encounter for immunization: Secondary | ICD-10-CM

## 2018-05-24 DIAGNOSIS — R140 Abdominal distension (gaseous): Secondary | ICD-10-CM

## 2018-05-24 DIAGNOSIS — K56609 Unspecified intestinal obstruction, unspecified as to partial versus complete obstruction: Secondary | ICD-10-CM | POA: Diagnosis present

## 2018-05-24 DIAGNOSIS — R4789 Other speech disturbances: Secondary | ICD-10-CM | POA: Diagnosis present

## 2018-05-24 DIAGNOSIS — B961 Klebsiella pneumoniae [K. pneumoniae] as the cause of diseases classified elsewhere: Secondary | ICD-10-CM | POA: Diagnosis present

## 2018-05-24 DIAGNOSIS — M109 Gout, unspecified: Secondary | ICD-10-CM | POA: Diagnosis present

## 2018-05-24 DIAGNOSIS — Z87440 Personal history of urinary (tract) infections: Secondary | ICD-10-CM

## 2018-05-24 DIAGNOSIS — A419 Sepsis, unspecified organism: Secondary | ICD-10-CM | POA: Diagnosis present

## 2018-05-24 DIAGNOSIS — Z87442 Personal history of urinary calculi: Secondary | ICD-10-CM

## 2018-05-24 DIAGNOSIS — R54 Age-related physical debility: Secondary | ICD-10-CM | POA: Diagnosis present

## 2018-05-24 DIAGNOSIS — N3080 Other cystitis without hematuria: Secondary | ICD-10-CM | POA: Diagnosis present

## 2018-05-24 DIAGNOSIS — E119 Type 2 diabetes mellitus without complications: Secondary | ICD-10-CM | POA: Diagnosis present

## 2018-05-24 DIAGNOSIS — R4701 Aphasia: Secondary | ICD-10-CM

## 2018-05-24 DIAGNOSIS — R6521 Severe sepsis with septic shock: Secondary | ICD-10-CM | POA: Diagnosis present

## 2018-05-24 DIAGNOSIS — Z7984 Long term (current) use of oral hypoglycemic drugs: Secondary | ICD-10-CM | POA: Diagnosis not present

## 2018-05-24 DIAGNOSIS — Z8619 Personal history of other infectious and parasitic diseases: Secondary | ICD-10-CM

## 2018-05-24 DIAGNOSIS — R338 Other retention of urine: Secondary | ICD-10-CM | POA: Diagnosis present

## 2018-05-24 DIAGNOSIS — N390 Urinary tract infection, site not specified: Secondary | ICD-10-CM

## 2018-05-24 DIAGNOSIS — K8000 Calculus of gallbladder with acute cholecystitis without obstruction: Secondary | ICD-10-CM | POA: Diagnosis present

## 2018-05-24 HISTORY — PX: IR PERC CHOLECYSTOSTOMY: IMG2326

## 2018-05-24 LAB — CBC WITH DIFFERENTIAL/PLATELET
Abs Immature Granulocytes: 0.18 10*3/uL — ABNORMAL HIGH (ref 0.00–0.07)
Basophils Absolute: 0 10*3/uL (ref 0.0–0.1)
Basophils Relative: 0 %
Eosinophils Absolute: 0 10*3/uL (ref 0.0–0.5)
Eosinophils Relative: 0 %
HEMATOCRIT: 27.9 % — AB (ref 39.0–52.0)
Hemoglobin: 8.7 g/dL — ABNORMAL LOW (ref 13.0–17.0)
Immature Granulocytes: 1 %
LYMPHS PCT: 4 %
Lymphs Abs: 0.9 10*3/uL (ref 0.7–4.0)
MCH: 28.1 pg (ref 26.0–34.0)
MCHC: 31.2 g/dL (ref 30.0–36.0)
MCV: 90 fL (ref 80.0–100.0)
Monocytes Absolute: 1.2 10*3/uL — ABNORMAL HIGH (ref 0.1–1.0)
Monocytes Relative: 6 %
Neutro Abs: 17.9 10*3/uL — ABNORMAL HIGH (ref 1.7–7.7)
Neutrophils Relative %: 89 %
Platelets: 172 10*3/uL (ref 150–400)
RBC: 3.1 MIL/uL — AB (ref 4.22–5.81)
RDW: 15.9 % — AB (ref 11.5–15.5)
WBC Morphology: INCREASED
WBC: 20.2 10*3/uL — ABNORMAL HIGH (ref 4.0–10.5)
nRBC: 0 % (ref 0.0–0.2)

## 2018-05-24 LAB — COMPREHENSIVE METABOLIC PANEL
ALK PHOS: 201 U/L — AB (ref 38–126)
ALT: 111 U/L — ABNORMAL HIGH (ref 0–44)
AST: 70 U/L — ABNORMAL HIGH (ref 15–41)
Albumin: 2 g/dL — ABNORMAL LOW (ref 3.5–5.0)
Anion gap: 9 (ref 5–15)
BILIRUBIN TOTAL: 2.7 mg/dL — AB (ref 0.3–1.2)
BUN: 15 mg/dL (ref 8–23)
CALCIUM: 7.9 mg/dL — AB (ref 8.9–10.3)
CO2: 22 mmol/L (ref 22–32)
Chloride: 107 mmol/L (ref 98–111)
Creatinine, Ser: 1.17 mg/dL (ref 0.61–1.24)
GFR calc Af Amer: >60 mL/min (ref >60)
GFR calc non Af Amer: >60 mL/min (ref >60)
GLUCOSE: 177 mg/dL — AB (ref 70–99)
Potassium: 3.2 mmol/L — ABNORMAL LOW (ref 3.5–5.1)
Sodium: 138 mmol/L (ref 135–145)
TOTAL PROTEIN: 7 g/dL (ref 6.5–8.1)

## 2018-05-24 LAB — URINALYSIS, ROUTINE W REFLEX MICROSCOPIC
Bilirubin Urine: NEGATIVE
Glucose, UA: NEGATIVE mg/dL
Ketones, ur: 5 mg/dL — AB
Nitrite: NEGATIVE
Protein, ur: 30 mg/dL — AB
SPECIFIC GRAVITY, URINE: 1.009 (ref 1.005–1.030)
WBC, UA: >50 WBC/hpf — ABNORMAL HIGH (ref 0–5)
pH: 6 (ref 5.0–8.0)

## 2018-05-24 LAB — GLUCOSE, CAPILLARY
GLUCOSE-CAPILLARY: 121 mg/dL — AB (ref 70–99)
Glucose-Capillary: 145 mg/dL — ABNORMAL HIGH (ref 70–99)
Glucose-Capillary: 82 mg/dL (ref 70–99)
Glucose-Capillary: 106 mg/dL — ABNORMAL HIGH (ref 70–99)
Glucose-Capillary: 106 mg/dL — ABNORMAL HIGH (ref 70–99)

## 2018-05-24 LAB — APTT: APTT: 37 s — AB (ref 24–36)

## 2018-05-24 LAB — MAGNESIUM: Magnesium: 1.5 mg/dL — ABNORMAL LOW (ref 1.7–2.4)

## 2018-05-24 LAB — TROPONIN I
Troponin I: 0.04 ng/mL (ref <0.03)
Troponin I: 0.03 ng/mL (ref <0.03)
Troponin I: 0.03 ng/mL (ref <0.03)

## 2018-05-24 LAB — LACTIC ACID, PLASMA
Lactic Acid, Venous: 1.6 mmol/L (ref 0.5–1.9)
Lactic Acid, Venous: 1.1 mmol/L (ref 0.5–1.9)

## 2018-05-24 LAB — PHOSPHORUS: Phosphorus: 3.9 mg/dL (ref 2.5–4.6)

## 2018-05-24 LAB — PROTIME-INR
INR: 1.38
Prothrombin Time: 16.8 seconds — ABNORMAL HIGH (ref 11.4–15.2)

## 2018-05-24 LAB — HIV ANTIBODY (ROUTINE TESTING W REFLEX): HIV Screen 4th Generation wRfx: NONREACTIVE

## 2018-05-24 MED ORDER — PIPERACILLIN-TAZOBACTAM 3.375 G IVPB
3.3750 g | Freq: Three times a day (TID) | INTRAVENOUS | Status: DC
  Administered 2018-05-24 – 2018-05-27 (×10): 3.375 g via INTRAVENOUS
  Filled 2018-05-24 (×10): qty 50

## 2018-05-24 MED ORDER — SODIUM CHLORIDE 0.9% FLUSH
5.0000 mL | Freq: Three times a day (TID) | INTRAVENOUS | Status: DC
  Administered 2018-05-24 – 2018-05-28 (×13): 5 mL

## 2018-05-24 MED ORDER — MAGNESIUM SULFATE 2 GM/50ML IV SOLN: 2.0000 g | Freq: Once | INTRAVENOUS | Status: DC

## 2018-05-24 MED ORDER — FENTANYL CITRATE (PF) 100 MCG/2ML IJ SOLN
25.0000 ug | INTRAMUSCULAR | Status: DC | PRN
  Administered 2018-05-24: 25 ug via INTRAVENOUS
  Filled 2018-05-24: qty 2

## 2018-05-24 MED ORDER — TAMSULOSIN HCL 0.4 MG PO CAPS
0.4000 mg | ORAL_CAPSULE | Freq: Every evening | ORAL | Status: DC
  Administered 2018-05-24 – 2018-05-27 (×4): 0.4 mg via ORAL
  Filled 2018-05-24 (×4): qty 1

## 2018-05-24 MED ORDER — LIDOCAINE HCL 1 % IJ SOLN
INTRAMUSCULAR | Status: AC
  Filled 2018-05-24: qty 20

## 2018-05-24 MED ORDER — FENTANYL CITRATE (PF) 100 MCG/2ML IJ SOLN
INTRAMUSCULAR | Status: AC
  Filled 2018-05-24: qty 2

## 2018-05-24 MED ORDER — SODIUM CHLORIDE 0.9 % IV SOLN
INTRAVENOUS | Status: DC
  Administered 2018-05-24 – 2018-05-28 (×4): via INTRAVENOUS

## 2018-05-24 MED ORDER — ONDANSETRON HCL 4 MG PO TABS: 4.0000 mg | ORAL_TABLET | Freq: Four times a day (QID) | ORAL | Status: DC | PRN

## 2018-05-24 MED ORDER — POTASSIUM CHLORIDE 10 MEQ/100ML IV SOLN
10.0000 meq | INTRAVENOUS | Status: AC
  Administered 2018-05-24 (×2): 10 meq via INTRAVENOUS
  Filled 2018-05-24 (×2): qty 100

## 2018-05-24 MED ORDER — MIDAZOLAM HCL 2 MG/2ML IJ SOLN
INTRAMUSCULAR | Status: AC
  Filled 2018-05-24: qty 4

## 2018-05-24 MED ORDER — IOPAMIDOL (ISOVUE-300) INJECTION 61%
INTRAVENOUS | Status: AC
  Administered 2018-05-24: 10 mL
  Filled 2018-05-24: qty 50

## 2018-05-24 MED ORDER — INSULIN ASPART 100 UNIT/ML ~~LOC~~ SOLN
0.0000 [IU] | SUBCUTANEOUS | Status: DC
  Administered 2018-05-24 (×2): 1 [IU] via SUBCUTANEOUS
  Administered 2018-05-25 (×2): 2 [IU] via SUBCUTANEOUS
  Administered 2018-05-26: 1 [IU] via SUBCUTANEOUS
  Administered 2018-05-26: 5 [IU] via SUBCUTANEOUS
  Administered 2018-05-26: 2 [IU] via SUBCUTANEOUS
  Administered 2018-05-27: 1 [IU] via SUBCUTANEOUS
  Administered 2018-05-27 (×3): 2 [IU] via SUBCUTANEOUS
  Administered 2018-05-28: 1 [IU] via SUBCUTANEOUS
  Administered 2018-05-28 (×2): 2 [IU] via SUBCUTANEOUS

## 2018-05-24 MED ORDER — SODIUM CHLORIDE 0.9 % IV BOLUS
1000.0000 mL | Freq: Once | INTRAVENOUS | Status: AC
  Administered 2018-05-24: 1000 mL via INTRAVENOUS

## 2018-05-24 MED ORDER — ONDANSETRON HCL 4 MG/2ML IJ SOLN: 4.0000 mg | Freq: Four times a day (QID) | INTRAMUSCULAR | Status: DC | PRN

## 2018-05-24 MED ORDER — MAGNESIUM SULFATE 2 GM/50ML IV SOLN
2.0000 g | Freq: Once | INTRAVENOUS | Status: AC
  Administered 2018-05-24: 2 g via INTRAVENOUS
  Filled 2018-05-24: qty 50

## 2018-05-24 MED ORDER — CHLORHEXIDINE GLUCONATE 0.12 % MT SOLN
15.0000 mL | Freq: Two times a day (BID) | OROMUCOSAL | Status: DC
  Administered 2018-05-24 – 2018-05-28 (×8): 15 mL via OROMUCOSAL
  Filled 2018-05-24 (×8): qty 15

## 2018-05-24 MED ORDER — FENTANYL CITRATE (PF) 100 MCG/2ML IJ SOLN
INTRAMUSCULAR | Status: AC | PRN
  Administered 2018-05-24: 25 ug via INTRAVENOUS

## 2018-05-24 MED ORDER — MIDAZOLAM HCL 2 MG/2ML IJ SOLN
INTRAMUSCULAR | Status: AC | PRN
  Administered 2018-05-24: 1 mg via INTRAVENOUS

## 2018-05-24 MED ORDER — ORAL CARE MOUTH RINSE
15.0000 mL | Freq: Two times a day (BID) | OROMUCOSAL | Status: DC
  Administered 2018-05-25 – 2018-05-28 (×8): 15 mL via OROMUCOSAL

## 2018-05-24 NOTE — Progress Notes (Signed)
Reassessed patient's blood pressure at 0438; BP 90/52. Called DO Gardner at [phone]; however DO Gardner arrived to the floor at 0445 to assess patient. Gardner ordered stat EKG and bolus. Lab called to clarify need to draw additional lactic acid at 0600 in addition to 0400 draw. Spoke with Julian Reil and confirmed to draw lab again at 0600. Verbally informed DO Gardner patient troponin of 0.04 as notified by lab at 0546 Julian Reil on the unit during phone call). Initiated second IV via IV team given need for number of interventions. Julian Reil requested q 30 min BP.  Bladder scan at 0635 >929 mL. Paged DO Julian Reil; ordered foley cath. May need to implement mitts to prevent pulling out foley. Patient family report that he previously removed foley and suggested mitts. Foley inserted at 0700.   Family at bedside; patient's Aunt Charlestine Night and [facility] [name] indicated patient taken care of by her brother; patient's uncle.

## 2018-05-24 NOTE — Progress Notes (Signed)
PROGRESS NOTE        PATIENT DETAILS Name: [name] Age: 68 y.o. Sex: male Date of Birth: [birth year] Admit Date: 05/24/2018 Admitting Physician Hillary BowJared M Gardner, DO PCP:Kim, Fayrene FearingJames, MD  Brief Narrative: Patient is a 68 y.o. male with history of intellectual disability, diabetes, hypertension (no longer on antihypertensives), BPH with urinary retention requiring in/out catheterization 3 times daily at home-presenting with abdominal pain and fever, CT abdomen suggestive of acute gangrenous cholecystitis, along with high-grade SBO and emphysematous cystitis.  Subjective: Lying comfortably in bed-Per nursing staff-family has not vomited.  Abdomen is soft-he denies any abdominal pain.  He has not received any narcotics overnight.  Assessment/Plan: Secondary to acute gangrenous cholecystitis: Sepsis pathophysiology improving, continue IV Zosyn.  Evaluated by general surgery with recommendations for percutaneous cholecystostomy drainage.  Small bowel obstruction: Seen on CT abdomen-however patient without history of vomiting-he had a large bowel movement earlier this morning.  Abdominal exam is benign.  Hold off on placing a NG tube-n.p.o. and supportive care.  Emphysematous cystitis: Gas in the urinary bladder could be from in/out catheterization that is done 3 times daily by family.  He is on Zosyn-that should cover usual culprit organisms.  Await culture data.  Acute urinary retention: Patient has intermittent urinary retention at home-family catheterizes him 3 times daily, was found to have more than 900 cc of urine on admission-hence Foley catheter placed.  Once his medical issues are more stable-we will DC Foley catheter and continue I/O catheterization.  Chronic normocytic anemia: Hemoglobin worsened-suspect secondary to sepsis/acute illness and IV fluid dilution.  No evidence of blood loss.  Follow.    DM-2: Hold metformin-CBG stable with SSI.  History of  hypertension: Per family-no longer on any antihypertensives.  History of BPH: Resume Flomax.  See above regarding intermittent catheterization done at home.  Mental retardation/intellectual disability  DVT Prophylaxis: SCD's -we will start pharmacological prophylaxis once cholecystostomy drain placed-likely on 2/9  Code Status: Full code  Family Communication: 2 aunts at bedside.  Disposition Plan: Remain inpatient  Antimicrobial agents: Anti-infectives (From admission, onward)   Start     Dose/Rate Route Frequency Ordered Stop   05/24/18 0600  piperacillin-tazobactam (ZOSYN) IVPB 3.375 g     3.375 g 12.5 mL/hr over 240 Minutes Intravenous Every 8 hours 05/24/18 0318        Procedures: None  CONSULTS:  general surgery and IR  Time spent: 25 minutes-Greater than 50% of this time was spent in counseling, explanation of diagnosis, planning of further management, and coordination of care.  MEDICATIONS: Scheduled Meds: . insulin aspart  0-9 Units Subcutaneous Q4H   Continuous Infusions: . sodium chloride 125 mL/hr at 05/24/18 [id number]  . piperacillin-tazobactam (ZOSYN)  IV 3.375 g (05/24/18 0525)   PRN Meds:.fentaNYL (SUBLIMAZE) injection, ondansetron **OR** ondansetron (ZOFRAN) IV   PHYSICAL EXAM: Vital signs: Vitals:   05/24/18 0610 05/24/18 0614 05/24/18 0644 05/24/18 0653  BP: (!) 92/50 (!) 98/59 (!) 89/56 (!) 101/54  Pulse: 89 88 85 83  Resp: 13 16 17 14   Temp:    97.6 F (36.4 C)  TempSrc:    Axillary  SpO2: 97% 100% 100% 99%  Weight:       Filed Weights   05/24/18 0240  Weight: 71 kg   Body mass index is 21.23 kg/m.   General appearance :Awake, alert, not in any  distress. HEENT: Atraumatic and Normocephalic Neck: supple Resp:Good air entry bilaterally, no added sounds  CVS: S1 S2 regular, no murmurs.  GI: Bowel sounds present, Non tender and not distended  Extremities: B/L Lower Ext shows no edema, both legs are warm to touch Neurology: Moving  all 4 extremities Musculoskeletal:No digital cyanosis Skin:No Rash, warm and dry Wounds:N/A  I have personally reviewed following labs and imaging studies  LABORATORY DATA: CBC: Recent Labs  Lab 05/24/18 0403  WBC 20.2*  NEUTROABS 17.9*  HGB 8.7*  HCT 27.9*  MCV 90.0  PLT 172    Basic Metabolic Panel: Recent Labs  Lab 05/24/18 0403  NA 138  K 3.2*  CL 107  CO2 22  GLUCOSE 177*  BUN 15  CREATININE 1.17  CALCIUM 7.9*  MG 1.5*  PHOS 3.9    GFR: Estimated Creatinine Clearance: 60.7 mL/min (by C-G formula based on SCr of 1.17 mg/dL).  Liver Function Tests: Recent Labs  Lab 05/24/18 0403  AST 70*  ALT 111*  ALKPHOS 201*  BILITOT 2.7*  PROT 7.0  ALBUMIN 2.0*   No results for input(s): LIPASE, AMYLASE in the last 168 hours. No results for input(s): AMMONIA in the last 168 hours.  Coagulation Profile: Recent Labs  Lab 05/24/18 0403  INR 1.38    Cardiac Enzymes: Recent Labs  Lab 05/24/18 0403  TROPONINI 0.04*    BNP (last 3 results) No results for input(s): PROBNP in the last 8760 hours.  HbA1C: No results for input(s): HGBA1C in the last 72 hours.  CBG: Recent Labs  Lab 05/24/18 0538 05/24/18 0832  GLUCAP 145* 121*    Lipid Profile: No results for input(s): CHOL, HDL, LDLCALC, TRIG, CHOLHDL, LDLDIRECT in the last 72 hours.  Thyroid Function Tests: No results for input(s): TSH, T4TOTAL, FREET4, T3FREE, THYROIDAB in the last 72 hours.  Anemia Panel: No results for input(s): VITAMINB12, FOLATE, FERRITIN, TIBC, IRON, RETICCTPCT in the last 72 hours.  Urine analysis:    Component Value Date/Time   COLORURINE YELLOW 04/26/2018 0840   APPEARANCEUR CLOUDY (A) 04/26/2018 0840   LABSPEC 1.005 04/26/2018 0840   PHURINE 7.0 04/26/2018 0840   GLUCOSEU NEGATIVE 04/26/2018 0840   HGBUR LARGE (A) 04/26/2018 0840   BILIRUBINUR NEGATIVE 04/26/2018 0840   KETONESUR NEGATIVE 04/26/2018 0840   PROTEINUR NEGATIVE 04/26/2018 0840   UROBILINOGEN  1.0 07/20/2014 1718   NITRITE POSITIVE (A) 04/26/2018 0840   LEUKOCYTESUR LARGE (A) 04/26/2018 0840    Sepsis Labs: Lactic Acid, Venous    Component Value Date/Time   LATICACIDVEN 1.1 05/24/2018 0545    MICROBIOLOGY: No results found for this or any previous visit (from the past 240 hour(s)).  RADIOLOGY STUDIES/RESULTS: [place] Renal  Result Date: 05/23/2018 CLINICAL DATA:  Followup severely dilated urinary bladder, severe bilateral hydroureteronephrosis and multiple left renal calculi seen on a abdomen and pelvis CT dated 04/07/2018. EXAM: RENAL / URINARY TRACT ULTRASOUND COMPLETE COMPARISON:  Abdomen and pelvis CT dated 04/07/2018 and abdomen radiographs dated 04/10/2018. FINDINGS: Right Kidney: Renal measurements: 11.6 x 6.7 x 5.9 cm = volume: 238 mL . Echogenicity within normal limits. No mass or hydronephrosis visualized. Left Kidney: Renal measurements: 12.5 x 6.4 x 6.3 cm = volume: 264 mL. Normal echotexture. No hydronephrosis. Multiple calculi. Bladder: Appears normal for degree of bladder distention. IMPRESSION: 1. Resolved bladder dilatation and bilateral hydroureteronephrosis. 2. Multiple nonobstructing left renal calculi. Electronically Signed   By: Beckie SaltsSteven  Reid M.D.   On: 05/23/2018 15:45     LOS: 0 days  Jeoffrey Massed, MD  [facility]  If 7PM-7AM, please contact night-coverage  Please page via [email or web address]  Go to [email or web address] and use [place]'s universal password to access. If you do not have the password, please contact the hospital operator.  Locate the [facility] provider you are looking for under [facility] and page to a number that you can be directly reached. If you still have difficulty reaching the provider, please page the [facility] (Director on Call) for the Hospitalists listed on amion for assistance.  05/24/2018, 10:55 AM

## 2018-05-24 NOTE — H&P (Signed)
History and Physical    [name] JHE:[id number] DOB: 01/13/1951 DOA: 05/24/2018  PCP: Jani Gravel, MD  Patient coming from: Home  I have personally briefly reviewed patient's old medical records in [place]  Chief Complaint: Abd pain  HPI: [name] is a 68 y.o. male with medical history significant of Mental disability, non verbal at baseline, DM2, HTN, c.diff after treatment for UTI in Dec 2019.  Patient presents to the ED at [facility] with c/o Abd pain, onset earlier today.  Associated foul smelling urine with fever.  Generalized weakness for the past 2 days.  ED Course: In addition to the expected UTI, patient is also found to have high grade SBO and gangrenous cholecystitis.  AST 90, ALT 155, ALK 372, lipase 289, Tbili 4.4, WBC 8k,   Given 3L bolus, initial BP 185 systolic, HR 631.  Started on zosyn.  Transferred to [facility].   Review of Systems: Unable to perform due to non-verbal status.  Past Medical History:  Diagnosis Date  . Diabetes mellitus without complication ([place])   . History of gout   . History of kidney stones   . Hypertension   . Mentally challenged   . Urinary retention     Past Surgical History:  Procedure Laterality Date  . CYSTOSCOPY W/ URETERAL STENT PLACEMENT Bilateral 06/20/2016   Procedure: CYSTOSCOPY WITH RETROGRADE PYELOGRAM/URETERAL STENT PLACEMENT;  Surgeon: Cleon Gustin, MD;  Location: [facility];  Service: Urology;  Laterality: Bilateral;  . CYSTOSCOPY WITH RETROGRADE PYELOGRAM, URETEROSCOPY AND STENT PLACEMENT Bilateral 08/20/2016   Procedure: CYSTOSCOPY WITH BILATERAL RETROGRADE PYELOGRAM, BILATERAL URETEROSCOPY,  LASER LITHOTRIPSY OF RIGHT URETERAL CALCULI, STONE BASKET EXTRACTION LEFT URETERAL CALCULI  AND BILATERAL URETERAL STENT EXCHANGE;  Surgeon: Cleon Gustin, MD;  Location: [facility];  Service: Urology;  Laterality: Bilateral;  . HOLMIUM LASER APPLICATION Bilateral 07/23/7024   Procedure: HOLMIUM LASER APPLICATION;  Surgeon:  Cleon Gustin, MD;  Location: [facility];  Service: Urology;  Laterality: Bilateral;     reports that he has never smoked. He has never used smokeless tobacco. He reports that he does not drink alcohol or use drugs.  No Known Allergies  Family History  Problem Relation Age of Onset  . Gallbladder disease Other      Prior to Admission medications   Medication Sig Start Date End Date Taking? Authorizing Provider  aspirin EC 81 MG tablet Take 81 mg by mouth every morning.     [provider]  lisinopril (PRINIVIL,ZESTRIL) 10 MG tablet Take 1 tablet by mouth every morning.     [provider]  metFORMIN (GLUCOPHAGE) 500 MG tablet Take 1,000 mg by mouth every morning. 02/03/18   [provider]  saccharomyces boulardii (FLORASTOR) 250 MG capsule Take 1 capsule (250 mg total) by mouth 2 (two) times daily. 04/12/18   Kathie Dike, MD  tamsulosin (FLOMAX) 0.4 MG CAPS capsule Take 1 capsule (0.4 mg total) by mouth every evening. 04/12/18   Kathie Dike, MD  vancomycin (VANCOCIN) 50 mg/mL oral solution Take 2.5 mLs (125 mg total) by mouth 4 (four) times daily. 04/12/18   Kathie Dike, MD    Physical Exam: Vitals:   05/24/18 0240 05/24/18 0438  BP: (!) 99/54 (!) 90/52  Pulse: 96 89  Resp:  17  Temp: (!) 97.5 F (36.4 C) 97.7 F (36.5 C)  TempSrc: Oral Oral  SpO2: 98% 100%  Weight: 71 kg     Constitutional: NAD, calm, comfortable Eyes: PERRL, lids and  conjunctivae normal ENMT: Mucous membranes are moist. Posterior pharynx clear of any exudate or lesions.Normal dentition.  Neck: normal, supple, no masses, no thyromegaly Respiratory: clear to auscultation bilaterally, no wheezing, no crackles. Normal respiratory effort. No accessory muscle use.  Cardiovascular: Regular rate and rhythm, no murmurs / rubs / gallops. No extremity edema. 2+ pedal pulses. No carotid bruits.  Abdomen: Distended, TTP Musculoskeletal: no clubbing / cyanosis. No joint  deformity upper and lower extremities. Good ROM, no contractures. Normal muscle tone.  Skin: no rashes, lesions, ulcers. No induration Neurologic: CN 2-12 grossly intact. Sensation intact, DTR normal. Strength 5/5 in all 4.  Psychiatric: Non-verbal as per baseline.   Labs on Admission: I have personally reviewed following labs and imaging studies  CBC: Recent Labs  Lab 05/24/18 0403  WBC 20.2*  NEUTROABS 17.9*  HGB 8.7*  HCT 27.9*  MCV 90.0  PLT 428   Basic Metabolic Panel: Recent Labs  Lab 05/24/18 0403  NA 138  K 3.2*  CL 107  CO2 22  GLUCOSE 177*  BUN 15  CREATININE 1.17  CALCIUM 7.9*  MG 1.5*  PHOS 3.9   GFR: Estimated Creatinine Clearance: 60.7 mL/min (by C-G formula based on SCr of 1.17 mg/dL). Liver Function Tests: Recent Labs  Lab 05/24/18 0403  AST 70*  ALT 111*  ALKPHOS 201*  BILITOT 2.7*  PROT 7.0  ALBUMIN 2.0*   No results for input(s): LIPASE, AMYLASE in the last 168 hours. No results for input(s): AMMONIA in the last 168 hours. Coagulation Profile: Recent Labs  Lab 05/24/18 0403  INR 1.38   Cardiac Enzymes: Recent Labs  Lab 05/24/18 0403  TROPONINI 0.04*   BNP (last 3 results) No results for input(s): PROBNP in the last 8760 hours. HbA1C: No results for input(s): HGBA1C in the last 72 hours. CBG: Recent Labs  Lab 05/24/18 0538  GLUCAP 145*   Lipid Profile: No results for input(s): CHOL, HDL, LDLCALC, TRIG, CHOLHDL, LDLDIRECT in the last 72 hours. Thyroid Function Tests: No results for input(s): TSH, T4TOTAL, FREET4, T3FREE, THYROIDAB in the last 72 hours. Anemia Panel: No results for input(s): VITAMINB12, FOLATE, FERRITIN, TIBC, IRON, RETICCTPCT in the last 72 hours. Urine analysis:    Component Value Date/Time   COLORURINE YELLOW 04/26/2018 0840   APPEARANCEUR CLOUDY (A) 04/26/2018 0840   LABSPEC 1.005 04/26/2018 0840   PHURINE 7.0 04/26/2018 0840   GLUCOSEU NEGATIVE 04/26/2018 0840   HGBUR LARGE (A) 04/26/2018 0840     BILIRUBINUR NEGATIVE 04/26/2018 0840   KETONESUR NEGATIVE 04/26/2018 0840   PROTEINUR NEGATIVE 04/26/2018 0840   UROBILINOGEN 1.0 07/20/2014 1718   NITRITE POSITIVE (A) 04/26/2018 0840   LEUKOCYTESUR LARGE (A) 04/26/2018 0840    Radiological Exams on Admission: US Renal  Result Date: 05/23/2018 CLINICAL DATA:  Followup severely dilated urinary bladder, severe bilateral hydroureteronephrosis and multiple left renal calculi seen on a abdomen and pelvis CT dated 04/07/2018. EXAM: RENAL / URINARY TRACT ULTRASOUND COMPLETE COMPARISON:  Abdomen and pelvis CT dated 04/07/2018 and abdomen radiographs dated 04/10/2018. FINDINGS: Right Kidney: Renal measurements: 11.6 x 6.7 x 5.9 cm = volume: 238 mL . Echogenicity within normal limits. No mass or hydronephrosis visualized. Left Kidney: Renal measurements: 12.5 x 6.4 x 6.3 cm = volume: 264 mL. Normal echotexture. No hydronephrosis. Multiple calculi. Bladder: Appears normal for degree of bladder distention. IMPRESSION: 1. Resolved bladder dilatation and bilateral hydroureteronephrosis. 2. Multiple nonobstructing left renal calculi. Electronically Signed   By: Claudie Revering M.D.   On: 05/23/2018 15:45  EKG: Independently reviewed.  Assessment/Plan Principal Problem:   Acute gangrenous cholecystitis Active Problems:   Intellectual disability   HTN (hypertension)   Diabetes mellitus type 2, controlled ([place])   Severe sepsis with septic shock ([facility])   UTI (urinary tract infection)   Nonverbal   SBO (small bowel obstruction) ([place])    1. Severe sepsis with septic shock - 1. Secondary to UTI and / or the acute gangrenous cholecystitis 2. Zosyn empirically 3. IVF: 3L bolus in ED, 1L bolus here going, then 125 cc/hr NS 4. WBC has gone from 8 in the ED to [facility] here this AM 5. UCx, BCx ordered 2. Acute gangrenous cholecystitis - 1. Zosyn 2. Spoke with Dr. Donne Hazel: 1. They will see patient in a bit for consult 2. Needs perc drain 3. Have put in IR  consult for perc drain 4. Dr. Pincus Sanes also consulted by EDP, may want to give him a call this morning but not clear that theres much for GI to do at this point or that he has CBD stone. 5. Repeat CMP pending still 3. UTI - 1. Cx pending 2. Zosyn 3. Bladder scanning now 4. If positive will need foley 5. No hydronephrosis on CT scan 4. SBO - 1. High grade 2. Possibly related to #2 3. Per Dr. Donne Hazel: needs NGT 4. NPO 5. IVF 5. Nonverbal with intellectual disability - 1. chronic and baseline 2. Likely to complicate treatment as he has already pulled out NGT at OSH 6. DM2 - 1. Hold metformin 2. Sensitive SSI Q4H 7. HTN - 1. Holding home BP meds 8. Hypomagnesemia - replacing mag  DVT prophylaxis: SCDs for the moment given we are unsure what surgical procedures may be needed today Code Status: Full Family Communication: Family at bedside Disposition Plan: Home after admit Consults called: Dr. Donne Hazel, EDP spoke with Dr. Pincus Sanes Admission status: Admit to inpatient  Severity of Illness: The appropriate patient status for this patient is INPATIENT. Inpatient status is judged to be reasonable and necessary in order to provide the required intensity of service to ensure the patient's safety. The patient's presenting symptoms, physical exam findings, and initial radiographic and laboratory data in the context of their chronic comorbidities is felt to place them at high risk for further clinical deterioration. Furthermore, it is not anticipated that the patient will be medically stable for discharge from the hospital within 2 midnights of admission. The following factors support the patient status of inpatient.   " The patient's presenting symptoms include Abdominal pain. " The worrisome physical exam findings include Fever, Tachycardia. " The initial radiographic and laboratory data are worrisome because of UTI, gangrenous cholecystitis, SBO, and developing worsening sepsis. " The  chronic co-morbidities include MR, non verbal.   * I certify that at the point of admission it is my clinical judgment that the patient will require inpatient hospital care spanning beyond 2 midnights from the point of admission due to high intensity of service, high risk for further deterioration and high frequency of surveillance required.*    ,  M. DO [facility]  How to contact the [facility] Attending or Consulting provider [place] or covering provider during after hours [place], for this patient?  1. Check the care team in [facility] and look for a) attending/consulting [facility] provider listed and b) the [facility] team listed 2. Log into [email or web address]  Amion Physician Scheduling and messaging for groups and whole hospitals  On call and physician scheduling software for group practices, residents,  hospitalists and other medical providers for call, clinic, rotation and shift schedules. On[employer] is a hospital-wide system for scheduling doctors and paging doctors on call. EasyPlot is for scientific plotting and data analysis.  [email or web address]  and use [place]'s universal password to access. If you do not have the password, please contact the hospital operator.  3. Locate the [facility] provider you are looking for under [facility] and page to a number that you can be directly reached. 4. If you still have difficulty reaching the provider, please page the [facility] (Director on Call) for the Hospitalists listed on amion for assistance.  05/24/2018, 5:48 AM

## 2018-05-24 NOTE — Progress Notes (Signed)
Pharmacy Antibiotic Note  [name] is a 68 y.o. male admitted on 05/24/2018 with intra-abdominal infection.  Pharmacy has been consulted for Zosyn dosing.  Plan: Rec'd Rocephin and Zosyn at OSH. Zosyn 3.375g IV q8h (4-hour infusion).  Weight: (71.0 kg)  Temp (24hrs), Avg:97.5 F (36.4 C), Min:97.5 F (36.4 C), Max:97.5 F (36.4 C)  No Known Allergies   Thank you for allowing pharmacy to be a part of this patient's care.  Vernard Gambles, PharmD, BCPS  05/24/2018 3:17 AM

## 2018-05-24 NOTE — Procedures (Signed)
Interventional Radiology Procedure Note  Procedure: Image guided perc chole.  6F drain placed.   Complications: None Recommendations:  - to gravity drain - TID sterile saline flushes 5cc - 10cc. - Do not submerge - Routine care   Signed,  Yvone NeuJaime S. Loreta AveWagner, DO

## 2018-05-24 NOTE — Sedation Documentation (Signed)
250cc NS bolus given for soft BP per VO Wanger, DO.

## 2018-05-24 NOTE — Consult Note (Addendum)
[facility] Surgery Consult Note  IZYK MARTY 1950/11/14  [id number].    Requesting MD Level one Trauma: Dr. Jennette Kettle Chief Complaint/Reason for Consult: Gangrenous Gallbladder/Possible SBO  HPI: [name] is a 68 y.o. male with medical history significant for mental disability, nonverbal at baseline, DM 2, hypertension and recent C. difficile treatment after UTI in December 2019. Patient lives at home with family.  History is mainly provided by family (2 Aunts and Uncle) who is at bedside as patient is nonverbal.  Over the last 3 days patient reportedly has had decreased appetite which has led to generalized weakness.  Last night patient began having abdominal distention and indicating that he had abdominal pain, which they state he indicates by pointing to his abdomen.  Patient reportedly pointed to his upper abdomen as well as his lower abdomen when asked where his areas of pain were yesterday.  No associated fevers, emesis, diarrhea.  Patient's last bowel movement was yesterday evening and soft.  His aunt has been treating him for constipation with prune juice.  He presented to the emergency department in [place] and was admitted to [facility] for suspected sepsis secondary to a UTI, and also found to have a high-grade SBO and gangrenous cholecystitis. Aunt states that he has had problems with his gallbladder for ~8 years and has seen a Psychologist, sport and exercise in [place] in the past but told he was not a candidate for surgery as he did "not have a lining of his intestines". We were asked to consult. He is on a '81mg'$  ASA. No other blood thinners. No previous abdominal surgeries.   ED course: Per hospitalist note - "AST 90, ALT 155, ALK 372, lipase 289, Tbili 4.4, WBC 8k". He was given 3L bolus, started on Zosyn and transferred to [facility].   Imaging on PACS: " Normal gallbladder fundus with phlegmon continuous with adjacent right chest wall with small foci of gas in the gallbladder and within the  phlegmon.  Underlying chronic cholelithiasis.  Associated biliary ductal enlargement.  High-grade small bowel obstruction with transition in the distal ileum, transition point located in the epigastrium.  Distal large bowel ileus suspected with retained low-density stool in colon".  Hospital course: WBC 20.2, T. Bili 2.7, AST 70, ALT 111, Alk Phos 201, K 3.2, Mg 1.5, Alb 2.0. Lactic acid 1.6 > 1.1. Blood Cx pending.   ROS: Limited 2/2 patient being non-verbal Review of Systems  Unable to perform ROS: Patient nonverbal  Constitutional: Negative for fever.  Gastrointestinal: Positive for abdominal pain and constipation. Negative for diarrhea and vomiting.     Family History  Problem Relation Age of Onset  . Gallbladder disease Other     Past Medical History:  Diagnosis Date  . Diabetes mellitus without complication ([place])   . History of gout   . History of kidney stones   . Hypertension   . Mentally challenged   . Urinary retention     Past Surgical History:  Procedure Laterality Date  . CYSTOSCOPY W/ URETERAL STENT PLACEMENT Bilateral 06/20/2016   Procedure: CYSTOSCOPY WITH RETROGRADE PYELOGRAM/URETERAL STENT PLACEMENT;  Surgeon: Cleon Gustin, MD;  Location: [facility];  Service: Urology;  Laterality: Bilateral;  . CYSTOSCOPY WITH RETROGRADE PYELOGRAM, URETEROSCOPY AND STENT PLACEMENT Bilateral 08/20/2016   Procedure: CYSTOSCOPY WITH BILATERAL RETROGRADE PYELOGRAM, BILATERAL URETEROSCOPY,  LASER LITHOTRIPSY OF RIGHT URETERAL CALCULI, STONE BASKET EXTRACTION LEFT URETERAL CALCULI  AND BILATERAL URETERAL STENT EXCHANGE;  Surgeon: Cleon Gustin, MD;  Location: [facility];  Service: Urology;  Laterality: Bilateral;  . HOLMIUM LASER APPLICATION Bilateral 10/19/2829   Procedure: HOLMIUM LASER APPLICATION;  Surgeon: Cleon Gustin, MD;  Location: [facility];  Service: Urology;  Laterality: Bilateral;    Social History:  reports that he has never smoked. He has never used smokeless  tobacco. He reports that he does not drink alcohol or use drugs.  Allergies: No Known Allergies  Medications Prior to Admission  Medication Sig Dispense Refill  . aspirin EC 81 MG tablet Take 81 mg by mouth every morning.     Marland Kitchen lisinopril (PRINIVIL,ZESTRIL) 10 MG tablet Take 1 tablet by mouth every morning.     . metFORMIN (GLUCOPHAGE) 500 MG tablet Take 1,000 mg by mouth every morning.  3  . saccharomyces boulardii (FLORASTOR) 250 MG capsule Take 1 capsule (250 mg total) by mouth 2 (two) times daily. 60 capsule 0  . tamsulosin (FLOMAX) 0.4 MG CAPS capsule Take 1 capsule (0.4 mg total) by mouth every evening. 30 capsule 1  . vancomycin (VANCOCIN) 50 mg/mL oral solution Take 2.5 mLs (125 mg total) by mouth 4 (four) times daily. 60 mL 0    Blood pressure (!) 101/54, pulse 83, temperature 97.6 F (36.4 C), temperature source Axillary, resp. rate 14, weight 71 kg, SpO2 99 %.  Physical Exam Vitals signs and nursing note reviewed.  Constitutional:      Appearance: He is well-developed. He is not diaphoretic.     Comments: Frail appearing.   HENT:     Head: Normocephalic and atraumatic.     Right Ear: External ear normal.     Left Ear: External ear normal.     Nose: Nose normal.     Mouth/Throat:     Mouth: Mucous membranes are dry.     Pharynx: Uvula midline.     Tonsils: No tonsillar exudate.  Eyes:     General: No scleral icterus.       Right eye: No discharge.        Left eye: No discharge.     Pupils: Pupils are equal, round, and reactive to light.  Neck:     Musculoskeletal: Neck supple.  Cardiovascular:     Rate and Rhythm: Normal rate and regular rhythm.     Pulses:          Radial pulses are 2+ on the right side and 2+ on the left side.       Dorsalis pedis pulses are 2+ on the right side and 2+ on the left side.       Posterior tibial pulses are 2+ on the right side and 2+ on the left side.     Heart sounds: No murmur.  Pulmonary:     Effort: Pulmonary effort is  normal.     Breath sounds: Normal breath sounds.  Abdominal:     General: Bowel sounds are decreased. There is distension (mild).     Palpations: Abdomen is soft.     Tenderness: There is no abdominal tenderness. There is no guarding or rebound. Negative signs include Murphy's sign.     Hernia: A hernia is present. Hernia is present in the ventral area (no evidence of incaceration).     Comments: Difficult to assess given patient's non-verbal state. Not tender on my exam. No rigidity or guarding.  Musculoskeletal:     Right lower leg: No edema.     Left lower leg: No edema.     Comments: SCD in place  Skin:    General:  Skin is warm and dry.     Findings: No rash.  Neurological:     Mental Status: He is alert.     Results for orders placed or performed during the hospital encounter of 05/24/18 (from the past 48 hour(s))  Comprehensive metabolic panel     Status: Abnormal   Collection Time: 05/24/18  4:03 AM  Result Value Ref Range   Sodium 138 135 - 145 mmol/L   Potassium 3.2 (L) 3.5 - 5.1 mmol/L   Chloride 107 98 - 111 mmol/L   CO2 22 22 - 32 mmol/L   Glucose, Bld 177 (H) 70 - 99 mg/dL   BUN 15 8 - 23 mg/dL   Creatinine, Ser 1.17 0.61 - 1.24 mg/dL   Calcium 7.9 (L) 8.9 - 10.3 mg/dL   Total Protein 7.0 6.5 - 8.1 g/dL   Albumin 2.0 (L) 3.5 - 5.0 g/dL   AST 70 (H) 15 - 41 U/L   ALT 111 (H) 0 - 44 U/L   Alkaline Phosphatase 201 (H) 38 - 126 U/L   Total Bilirubin 2.7 (H) 0.3 - 1.2 mg/dL   GFR calc non Af Amer >60 >60 mL/min   GFR calc Af Amer >60 >60 mL/min   Anion gap 9 5 - 15    Comment: Performed at [facility], [address], [place], [place] [address]  Magnesium     Status: Abnormal   Collection Time: 05/24/18  4:03 AM  Result Value Ref Range   Magnesium 1.5 (L) 1.7 - 2.4 mg/dL    Comment: Performed at [facility] [address] [place], [place] [address]  Phosphorus     Status: None   Collection Time: 05/24/18  4:03 AM  Result Value Ref Range    Phosphorus 3.9 2.5 - 4.6 mg/dL    Comment: Performed at [facility] [address] [place], [place] [address]  CBC WITH DIFFERENTIAL     Status: Abnormal   Collection Time: 05/24/18  4:03 AM  Result Value Ref Range   WBC 20.2 (H) 4.0 - 10.5 K/uL    Comment: REPEATED TO VERIFY WHITE COUNT CONFIRMED ON SMEAR    RBC 3.10 (L) 4.22 - 5.81 MIL/uL   Hemoglobin 8.7 (L) 13.0 - 17.0 g/dL   HCT 27.9 (L) 39.0 - 52.0 %   MCV 90.0 80.0 - 100.0 fL   MCH 28.1 26.0 - 34.0 pg   MCHC 31.2 30.0 - 36.0 g/dL   RDW 15.9 (H) 11.5 - 15.5 %   Platelets 172 150 - 400 K/uL   nRBC 0.0 0.0 - 0.2 %   Neutrophils Relative % 89 %   Neutro Abs 17.9 (H) 1.7 - 7.7 K/uL   Lymphocytes Relative 4 %   Lymphs Abs 0.9 0.7 - 4.0 K/uL   Monocytes Relative 6 %   Monocytes Absolute 1.2 (H) 0.1 - 1.0 K/uL   Eosinophils Relative 0 %   Eosinophils Absolute 0.0 0.0 - 0.5 K/uL   Basophils Relative 0 %   Basophils Absolute 0.0 0.0 - 0.1 K/uL   WBC Morphology INCREASED BANDS (>20% BANDS)     Comment: MILD LEFT SHIFT (1-5% METAS, OCC MYELO, OCC BANDS) SMUDGE CELLS VACUOLATED NEUTROPHILS    Immature Granulocytes 1 %   Abs Immature Granulocytes 0.18 (H) 0.00 - 0.07 K/uL   Polychromasia PRESENT     Comment: Performed at [facility], [place] [address], [place],  [address]  Troponin I - Now  Then Q6H     Status: Abnormal   Collection Time: 05/24/18  4:03 AM  Result Value Ref Range   Troponin I 0.04 (HH) <0.03 ng/mL    Comment: CRITICAL RESULT CALLED TO, READ BACK BY AND VERIFIED WITH: Loetta Rough RN 05/24/2018 0547 JORDANS Performed at [facility], [place] [address] [place], [place] [address]   Protime-INR     Status: Abnormal   Collection Time: 05/24/18  4:03 AM  Result Value Ref Range   Prothrombin Time 16.8 (H) 11.4 - 15.2 seconds   INR 1.38     Comment: Performed at [facility] [address], [place], [place] [address]  APTT     Status: Abnormal   Collection Time: 05/24/18  4:03 AM   Result Value Ref Range   aPTT 37 (H) 24 - 36 seconds    Comment:        IF BASELINE aPTT IS ELEVATED, SUGGEST PATIENT RISK ASSESSMENT BE USED TO DETERMINE APPROPRIATE ANTICOAGULANT THERAPY. Performed at [facility], [place] [address] [place], [place] [address]   Lactic acid, plasma     Status: None   Collection Time: 05/24/18  4:03 AM  Result Value Ref Range   Lactic Acid, Venous 1.6 0.5 - 1.9 mmol/L    Comment: Performed at [facility] [address], [place], [place] [address]  Glucose, capillary     Status: Abnormal   Collection Time: 05/24/18  5:38 AM  Result Value Ref Range   Glucose-Capillary 145 (H) 70 - 99 mg/dL  Lactic acid, plasma     Status: None   Collection Time: 05/24/18  5:45 AM  Result Value Ref Range   Lactic Acid, Venous 1.1 0.5 - 1.9 mmol/L    Comment: Performed at [facility], [place] [address], [place], [place] [address]   US Renal  Result Date: 05/23/2018 CLINICAL DATA:  Followup severely dilated urinary bladder, severe bilateral hydroureteronephrosis and multiple left renal calculi seen on a abdomen and pelvis CT dated 04/07/2018. EXAM: RENAL / URINARY TRACT ULTRASOUND COMPLETE COMPARISON:  Abdomen and pelvis CT dated 04/07/2018 and abdomen radiographs dated 04/10/2018. FINDINGS: Right Kidney: Renal measurements: 11.6 x 6.7 x 5.9 cm = volume: 238 mL . Echogenicity within normal limits. No mass or hydronephrosis visualized. Left Kidney: Renal measurements: 12.5 x 6.4 x 6.3 cm = volume: 264 mL. Normal echotexture. No hydronephrosis. Multiple calculi. Bladder: Appears normal for degree of bladder distention. IMPRESSION: 1. Resolved bladder dilatation and bilateral hydroureteronephrosis. 2. Multiple nonobstructing left renal calculi. Electronically Signed   By: Claudie Revering M.D.   On: 05/23/2018 15:45    Anti-infectives (From admission, onward)   Start     Dose/Rate Route Frequency Ordered Stop   05/24/18 0600  piperacillin-tazobactam (ZOSYN) IVPB  3.375 g     3.375 g 12.5 mL/hr over 240 Minutes Intravenous Every 8 hours 05/24/18 0318         Assessment/Plan Principal Problem:   Acute gangrenous cholecystitis Active Problems:   Intellectual disability   HTN (hypertension)   Diabetes mellitus type 2, controlled (HCC)   Severe sepsis with septic shock (HCC)   UTI (urinary tract infection)   Nonverbal   SBO (small bowel obstruction) (HCC)   Acute Gangrenous Cholecystitis - CT w/ abnormal gallbladder fundus with phlegmon continuous with adjacent right chest wall with small foci of gas in the gallbladder and within the phlegmon.  Underlying chronic cholelithiasis.  Associated biliary ductal enlargement - WBC 20.2,  T. Bili 2.7, AST 70, ALT 111, Alk Phos 201 - Cont IV abx - PERC drain per IR  - Trend labs - We will follow  SBO - CT w/ high-grade small bowel obstruction with transition in the distal ileum, transition point located in the epigastrium  - Keep Mg > 2, K > 4 - Patient without emesis. Will hold off on NG tube. Can place if has emesis - NPO   FEN: NPO, IVF VTE: SCD's, lovenox ID: Zosyn >> WBC 20.2, Blood Cx pending Follow up: TBD  Jillyn Ledger, [facility] Surgery 05/24/2018, 8:16 AM Pager: [phone] Consults: [phone] Mon-Fri 7:00 am-4:30 pm Sat-Sun 7:00 am-11:30 am

## 2018-05-24 NOTE — Progress Notes (Signed)
Bladder scan > .  Ordering foley.  May need mits on if he tries to pull it (has needed in past).

## 2018-05-24 NOTE — Consult Note (Addendum)
Chief Complaint: Patient was seen in consultation today for acute gangrenous cholecystits  Referring Physician(s): Dr. Lyda Perone  History of Present Illness: [name] is a 68 y.o. male with past medical history of intellectual disability, DM, HTN who presented to [facility] as transfer from [facility] with complaint of abdominal pain. He was recently treated at [facility] for C Diff colitis with possible perforation.  Imaging on PACS: " Normal gallbladder fundus with phlegmon continuous with adjacent right chest wall with small foci of gas in the gallbladder and within the phlegmon.  Underlying chronic cholelithiasis.  Associated biliary ductal enlargement.  High-grade small bowel obstruction with transition in the distal ileum, transition point located in the epigastrium.  Distal large bowel ileus suspected with retained low-density stool in colon".  IR consulted for percutaneous cholecystostomy tube placement. Case reviewed and approved by Dr. Loreta Ave.   Past Medical History:  Diagnosis Date  . Diabetes mellitus without complication (HCC)   . History of gout   . History of kidney stones   . Hypertension   . Mentally challenged   . Urinary retention     Past Surgical History:  Procedure Laterality Date  . CYSTOSCOPY W/ URETERAL STENT PLACEMENT Bilateral 06/20/2016   Procedure: CYSTOSCOPY WITH RETROGRADE PYELOGRAM/URETERAL STENT PLACEMENT;  Surgeon: Malen Gauze, MD;  Location: [facility];  Service: Urology;  Laterality: Bilateral;  . CYSTOSCOPY WITH RETROGRADE PYELOGRAM, URETEROSCOPY AND STENT PLACEMENT Bilateral 08/20/2016   Procedure: CYSTOSCOPY WITH BILATERAL RETROGRADE PYELOGRAM, BILATERAL URETEROSCOPY,  LASER LITHOTRIPSY OF RIGHT URETERAL CALCULI, STONE BASKET EXTRACTION LEFT URETERAL CALCULI  AND BILATERAL URETERAL STENT EXCHANGE;  Surgeon: Malen Gauze, MD;  Location: [facility];  Service: Urology;  Laterality: Bilateral;  . HOLMIUM LASER APPLICATION Bilateral 08/20/2016     Procedure: HOLMIUM LASER APPLICATION;  Surgeon: Malen Gauze, MD;  Location: [facility];  Service: Urology;  Laterality: Bilateral;    Allergies: Patient has no known allergies.  Medications: Prior to Admission medications   Medication Sig Start Date End Date Taking? Authorizing Provider  aspirin EC 81 MG tablet Take 81 mg by mouth every morning.     [provider]  lisinopril (PRINIVIL,ZESTRIL) 10 MG tablet Take 1 tablet by mouth every morning.     [provider]  metFORMIN (GLUCOPHAGE) 500 MG tablet Take 1,000 mg by mouth every morning. 02/03/18   [provider]  saccharomyces boulardii (FLORASTOR) 250 MG capsule Take 1 capsule (250 mg total) by mouth 2 (two) times daily. 04/12/18   Erick Blinks, MD  tamsulosin (FLOMAX) 0.4 MG CAPS capsule Take 1 capsule (0.4 mg total) by mouth every evening. 04/12/18   Erick Blinks, MD  vancomycin (VANCOCIN) 50 mg/mL oral solution Take 2.5 mLs (125 mg total) by mouth 4 (four) times daily. 04/12/18   Erick Blinks, MD     Family History  Problem Relation Age of Onset  . Gallbladder disease Other     Social History   Socioeconomic History  . Marital status: Single    Spouse name: Not on file  . Number of children: Not on file  . Years of education: Not on file  . Highest education level: Not on file  Occupational History  . Not on file  Social Needs  . Financial resource strain: Not on file  . Food insecurity:    Worry: Not on file    Inability: Not on file  . Transportation needs:    Medical: Not on file    Non-medical: Not  on file  Tobacco Use  . Smoking status: Never Smoker  . Smokeless tobacco: Never Used  Substance and Sexual Activity  . Alcohol use: No  . Drug use: No  . Sexual activity: Never    Birth control/protection: None  Lifestyle  . Physical activity:    Days per week: Not on file    Minutes per session: Not on file  . Stress: Not on file  Relationships  . Social  connections:    Talks on phone: Not on file    Gets together: Not on file    Attends religious service: Not on file    Active member of club or organization: Not on file    Attends meetings of clubs or organizations: Not on file    Relationship status: Not on file  Other Topics Concern  . Not on file  Social History Narrative  . Not on file    Review of Systems: A 12 point ROS discussed and pertinent positives are indicated in the HPI above.  All other systems are negative.  Review of Systems  Unable to perform ROS: Patient nonverbal    Vital Signs: BP (!) 101/54   Pulse 83   Temp 97.6 F (36.4 C) (Axillary)   Resp 14   Wt 156 lb 8.4 oz (71 kg)   SpO2 99%   BMI 21.23 kg/m   Physical Exam Vitals signs and nursing note reviewed.  Constitutional:      General: He is not in acute distress.    Appearance: He is ill-appearing.  Cardiovascular:     Rate and Rhythm: Normal rate and regular rhythm.     Heart sounds: No murmur. No friction rub. No gallop.   Pulmonary:     Effort: Pulmonary effort is normal. No respiratory distress.     Breath sounds: Normal breath sounds.  Abdominal:     Palpations: Abdomen is soft.     Tenderness: There is abdominal tenderness.  Skin:    General: Skin is warm and dry.  Neurological:     Mental Status: He is alert. Mental status is at baseline.     Comments: Non-verbal, mentally disabled  Psychiatric:        Behavior: Behavior normal.     Mallampati Score:  MD Evaluation Airway: WNL Heart: WNL Abdomen: WNL Chest/ Lungs: WNL ASA  Classification: 3 Mallampati/Airway Score: Two  Imaging: [place] Renal  Result Date: 05/23/2018 CLINICAL DATA:  Followup severely dilated urinary bladder, severe bilateral hydroureteronephrosis and multiple left renal calculi seen on a abdomen and pelvis CT dated 04/07/2018. EXAM: RENAL / URINARY TRACT ULTRASOUND COMPLETE COMPARISON:  Abdomen and pelvis CT dated 04/07/2018 and abdomen radiographs dated  04/10/2018. FINDINGS: Right Kidney: Renal measurements: 11.6 x 6.7 x 5.9 cm = volume: 238 mL . Echogenicity within normal limits. No mass or hydronephrosis visualized. Left Kidney: Renal measurements: 12.5 x 6.4 x 6.3 cm = volume: 264 mL. Normal echotexture. No hydronephrosis. Multiple calculi. Bladder: Appears normal for degree of bladder distention. IMPRESSION: 1. Resolved bladder dilatation and bilateral hydroureteronephrosis. 2. Multiple nonobstructing left renal calculi. Electronically Signed   By: Beckie SaltsSteven  Reid M.D.   On: 05/23/2018 15:45    Labs:  CBC: Recent Labs    04/09/18 0533 04/10/18 0452 04/11/18 0437 05/24/18 0403  WBC 12.5* 16.1* 13.8* 20.2*  HGB 11.0* 10.7* 10.3* 8.7*  HCT 32.8* 33.4* 33.4* 27.9*  PLT 210 212 223 172    COAGS: Recent Labs    05/24/18 0403  INR 1.38  APTT 37*    BMP: Recent Labs    04/10/18 0452 04/11/18 0437 04/12/18 1018 05/24/18 0403  NA 144 143 135 138  K 3.2* 3.6 3.7 3.2*  CL 109 109 104 107  CO2 30 29 25 22   GLUCOSE 153* 91 158* 177*  BUN 45* 25* 17 15  CALCIUM 8.1* 7.7* 7.3* 7.9*  CREATININE 1.49* 1.17 1.10 1.17  GFRNONAA 48* >60 >60 >60  GFRAA 55* >60 >60 >60    LIVER FUNCTION TESTS: Recent Labs    02/26/18 1605 05/24/18 0403  BILITOT 0.6 2.7*  AST 13* 70*  ALT 10 111*  ALKPHOS 55 201*  PROT 7.1 7.0  ALBUMIN 3.5 2.0*    TUMOR MARKERS: No results for input(s): AFPTM, CEA, CA199, CHROMGRNA in the last 8760 hours.  Assessment and Plan: Acute gangrenous cholecystitis  Patient admitted with abdominal pain.  Found to have perforated gallbladder with large intra-abdominal fluid collection.  WBC 20.2 Tbili 2.7 [facility].   IR consulted for percutaneous cholecystostomy tube.  Case reviewed and approved by Dr. Loreta AveWagner.  Plan to proceed today once able to obtain consent.   Patient has been NPO.  INR 1.38   Addendum 1028:  Family at bedside provides consent.  Risks and benefits discussed with  the patient including, but not limited to bleeding, infection, gallbladder perforation, bile leak, sepsis or even death.  All of the patient's questions were answered, patient is agreeable to proceed. Consent signed and in chart.  Thank you for this interesting consult.  I greatly enjoyed meeting [name] and look forward to participating in their care.  A copy of this report was sent to the requesting provider on this date.  Electronically Signed: Hoyt KochKacie Sue-Ellen Matthews 05/24/2018, 10:23 AM   I spent a total of 40 Minutes    in face to face in clinical consultation, greater than 50% of which was counseling/coordinating care for gangrenous cholecystitis.

## 2018-05-24 NOTE — Progress Notes (Addendum)
AVEYON NURSE is a 68 y.o. male patient admitted from ED awake, alert - oriented  X 4 - no acute distress noted.  VSS - Blood pressure (!) 101/54, pulse 83, temperature 97.6 F (36.4 C), temperature source Axillary, resp. rate 14, weight 71 kg, SpO2 99 %.    IV in place, occlusive dsg intact without redness.  Orientation to room, and floor completed with information packet given to patient/family.  Patient declined safety video at this time.  Admission INP armband ID verified with patient/family, and in place.   SR up x 2, fall assessment complete, with  family able to verbalize understanding of risk associated with falls, and verbalized understanding to call nsg before up out of bed (patient non-verbal).  Call light within reach, patient able to voice, and demonstrate understanding.    Skin assessment complete and documented. Right heel DTI 2x2.5 and left heel DTI 1x2.   No evidence of skin break down noted on exam.     Will cont to eval and treat per MD orders.  Garry Heater, RN 05/24/2018 9:26 AM

## 2018-05-25 ENCOUNTER — Encounter (HOSPITAL_COMMUNITY): Payer: Self-pay | Admitting: *Deleted

## 2018-05-25 DIAGNOSIS — K56609 Unspecified intestinal obstruction, unspecified as to partial versus complete obstruction: Secondary | ICD-10-CM | POA: Diagnosis not present

## 2018-05-25 DIAGNOSIS — K810 Acute cholecystitis: Secondary | ICD-10-CM | POA: Diagnosis not present

## 2018-05-25 DIAGNOSIS — I10 Essential (primary) hypertension: Secondary | ICD-10-CM | POA: Diagnosis not present

## 2018-05-25 DIAGNOSIS — E118 Type 2 diabetes mellitus with unspecified complications: Secondary | ICD-10-CM | POA: Diagnosis not present

## 2018-05-25 LAB — COMPREHENSIVE METABOLIC PANEL
ALBUMIN: 1.8 g/dL — AB (ref 3.5–5.0)
ALT: 71 U/L — ABNORMAL HIGH (ref 0–44)
AST: 39 U/L (ref 15–41)
Alkaline Phosphatase: 156 U/L — ABNORMAL HIGH (ref 38–126)
Anion gap: 10 (ref 5–15)
BILIRUBIN TOTAL: 1.4 mg/dL — AB (ref 0.3–1.2)
BUN: 12 mg/dL (ref 8–23)
CO2: 20 mmol/L — ABNORMAL LOW (ref 22–32)
Calcium: 8.2 mg/dL — ABNORMAL LOW (ref 8.9–10.3)
Chloride: 114 mmol/L — ABNORMAL HIGH (ref 98–111)
Creatinine, Ser: 0.98 mg/dL (ref 0.61–1.24)
GFR calc Af Amer: >60 mL/min (ref >60)
GFR calc non Af Amer: >60 mL/min (ref >60)
GLUCOSE: 104 mg/dL — AB (ref 70–99)
POTASSIUM: 3.3 mmol/L — AB (ref 3.5–5.1)
Sodium: 144 mmol/L (ref 135–145)
Total Protein: 6.6 g/dL (ref 6.5–8.1)

## 2018-05-25 LAB — GLUCOSE, CAPILLARY
Glucose-Capillary: 117 mg/dL — ABNORMAL HIGH (ref 70–99)
Glucose-Capillary: 160 mg/dL — ABNORMAL HIGH (ref 70–99)
Glucose-Capillary: 187 mg/dL — ABNORMAL HIGH (ref 70–99)
Glucose-Capillary: 229 mg/dL — ABNORMAL HIGH (ref 70–99)
Glucose-Capillary: 84 mg/dL (ref 70–99)
Glucose-Capillary: 88 mg/dL (ref 70–99)
Glucose-Capillary: 92 mg/dL (ref 70–99)

## 2018-05-25 LAB — CBC
HEMATOCRIT: 30.4 % — AB (ref 39.0–52.0)
Hemoglobin: 9.2 g/dL — ABNORMAL LOW (ref 13.0–17.0)
MCH: 27.5 pg (ref 26.0–34.0)
MCHC: 30.3 g/dL (ref 30.0–36.0)
MCV: 90.7 fL (ref 80.0–100.0)
Platelets: 195 10*3/uL (ref 150–400)
RBC: 3.35 MIL/uL — ABNORMAL LOW (ref 4.22–5.81)
RDW: 16.3 % — ABNORMAL HIGH (ref 11.5–15.5)
WBC: 12.1 10*3/uL — ABNORMAL HIGH (ref 4.0–10.5)
nRBC: 0 % (ref 0.0–0.2)

## 2018-05-25 LAB — MRSA PCR SCREENING: MRSA by PCR: POSITIVE — AB

## 2018-05-25 MED ORDER — SODIUM CHLORIDE 0.9 % IV SOLN: INTRAVENOUS | Status: DC | PRN

## 2018-05-25 MED ORDER — ENOXAPARIN SODIUM 40 MG/0.4ML ~~LOC~~ SOLN
40.0000 mg | SUBCUTANEOUS | Status: DC
  Administered 2018-05-25 – 2018-05-28 (×4): 40 mg via SUBCUTANEOUS
  Filled 2018-05-25 (×4): qty 0.4

## 2018-05-25 MED ORDER — PNEUMOCOCCAL VAC POLYVALENT 25 MCG/0.5ML IJ INJ
0.5000 mL | INJECTION | INTRAMUSCULAR | Status: AC
  Administered 2018-05-26: 0.5 mL via INTRAMUSCULAR
  Filled 2018-05-25: qty 0.5

## 2018-05-25 MED ORDER — MUPIROCIN 2 % EX OINT
1.0000 "application " | TOPICAL_OINTMENT | Freq: Two times a day (BID) | CUTANEOUS | Status: DC
  Administered 2018-05-25 – 2018-05-28 (×8): 1 via NASAL
  Filled 2018-05-25 (×4): qty 22

## 2018-05-25 MED ORDER — CHLORHEXIDINE GLUCONATE CLOTH 2 % EX PADS
6.0000 | MEDICATED_PAD | Freq: Every day | CUTANEOUS | Status: DC
  Administered 2018-05-25 – 2018-05-28 (×4): 6 via TOPICAL

## 2018-05-25 MED ORDER — POTASSIUM CHLORIDE 20 MEQ PO PACK
20.0000 meq | PACK | Freq: Two times a day (BID) | ORAL | Status: AC
  Administered 2018-05-25 – 2018-05-27 (×6): 20 meq via ORAL
  Filled 2018-05-25 (×6): qty 1

## 2018-05-25 NOTE — Progress Notes (Signed)
PT Cancellation Note  Patient Details Name: [name] MRN: [id number] DOB: 12-07-1950   Cancelled Treatment:    Reason Eval/Treat Not Completed: Other (comment) -- pt sleeping soundly, will return as able to initiate mobility assessment and make recommendations.    Narda Amber [facility] 05/25/2018, 11:44 AM

## 2018-05-25 NOTE — Progress Notes (Signed)
PROGRESS NOTE        PATIENT DETAILS Name: [name] Age: 68 y.o. Sex: male Date of Birth: 1950-11-06 Admit Date: 05/24/2018 Admitting Physician Hillary Bow, DO PCP:Kim, Fayrene Fearing, MD  Brief Narrative: Patient is a 68 y.o. male with history of intellectual disability, diabetes, hypertension (no longer on antihypertensives), BPH with urinary retention requiring in/out catheterization 3 times daily at home-presenting with abdominal pain and fever, CT abdomen suggestive of acute gangrenous cholecystitis, along with high-grade SBO and emphysematous cystitis.  Subjective: Lying comfortably in bed-aunt at bedside-no BM overnight-denies any abdominal pain.  Assessment/Plan: Secondary to acute gangrenous cholecystitis: Sepsis pathophysiology has resolved-clinically improved-abdominal exam is benign.  Continue IV Zosyn-underwent percutaneous cholecystostomy drainage on 2/8.  Gallbladder fluid cultures growing gram-negative rods, blood cultures still pending.  Surgery and IR following.    Small bowel obstruction: Seen on CT abdomen-however patient without any clinical features suggestive of small bowel obstruction.  Had a large BM yesterday.  No vomiting.  Has good bowel sounds with a benign appearing abdomen.  Starting clear liquids-we will slowly advance.   Emphysematous cystitis: Gas in the urinary bladder could be from in/out catheterization that is done 3 times daily by family.  Remains on empiric Zosyn-urine cultures drawn on 2/8-currently pending-but patient was already on IV antimicrobial therapy.    Acute urinary retention: Patient has intermittent urinary retention at home-family catheterizes him 3 times daily, was found to have more than 900 cc of urine on admission-hence Foley catheter placed.  If clinical improvement continues-we will DC Foley catheter tomorrow, and resume I/O catheterization.  Chronic normocytic anemia: Hemoglobin stable-suspect has chronic  anemia at baseline-hemoglobin slightly worse than usual due to sepsis/acute illness.  No evidence of overt blood loss.  Follow CBC periodically.   DM-2: Continue to hold metformin-CBG stable with SSI.  History of hypertension: Per family-no longer on any antihypertensives.  History of BPH: Continue Flomax.  See above regarding intermittent catheterization done at home.  Mental retardation/intellectual disability  DVT Prophylaxis: SCD's -start subcu Lovenox.    Code Status: Full code  Family Communication: Aunt at bedside.  Disposition Plan: Remain inpatient  Antimicrobial agents: Anti-infectives (From admission, onward)   Start     Dose/Rate Route Frequency Ordered Stop   05/24/18 0600  piperacillin-tazobactam (ZOSYN) IVPB 3.375 g     3.375 g 12.5 mL/hr over 240 Minutes Intravenous Every 8 hours 05/24/18 0318        Procedures: None  CONSULTS:  general surgery and IR  Time spent: 25 minutes-Greater than 50% of this time was spent in counseling, explanation of diagnosis, planning of further management, and coordination of care.  MEDICATIONS: Scheduled Meds: . chlorhexidine  15 mL Mouth Rinse BID  . Chlorhexidine Gluconate Cloth  6 each Topical Q0600  . insulin aspart  0-9 Units Subcutaneous Q4H  . mouth rinse  15 mL Mouth Rinse q12n4p  . mupirocin ointment  1 application Nasal BID  . sodium chloride flush  5 mL Intracatheter Q8H  . tamsulosin  0.4 mg Oral QPM   Continuous Infusions: . sodium chloride 125 mL/hr at 05/25/18 0041  . sodium chloride 10 mL/hr at 05/25/18 0231  . piperacillin-tazobactam (ZOSYN)  IV 3.375 g (05/25/18 0602)   PRN Meds:.sodium chloride, fentaNYL (SUBLIMAZE) injection, ondansetron **OR** ondansetron (ZOFRAN) IV   PHYSICAL EXAM: Vital signs: Vitals:   05/24/18 2155  05/25/18 0420 05/25/18 0907 05/25/18 0917  BP: (!) 103/54 (!) 106/55 (!) 108/52   Pulse: 88 83 80   Resp: 18 18 13    Temp: (!) 97.5 F (36.4 C) 97.7 F (36.5 C)  97.7  F (36.5 C)  TempSrc: Oral Oral    SpO2: 97% 98% 99%   Weight:       Filed Weights   05/24/18 0240  Weight: 71 kg   Body mass index is 21.23 kg/m.   General appearance:Awake, lying comfortably in bed-not in any distress. Eyes:no scleral icterus. HEENT: Atraumatic and Normocephalic Neck: supple, no JVD. Resp:Good air entry bilaterally,no rales or rhonchi CVS: S1 S2 regular, no murmurs.  GI: Bowel sounds present, Non tender and not distended with no gaurding, rigidity or rebound. Extremities: B/L Lower Ext shows no edema, both legs are warm to touch Neurology:  Non focal-has generalized weakness. Musculoskeletal:No digital cyanosis Skin:No Rash, warm and dry Wounds:N/A  I have personally reviewed following labs and imaging studies  LABORATORY DATA: CBC: Recent Labs  Lab 05/24/18 0403 05/25/18 0617  WBC 20.2* 12.1*  NEUTROABS 17.9*  --   HGB 8.7* 9.2*  HCT 27.9* 30.4*  MCV 90.0 90.7  PLT 172 195    Basic Metabolic Panel: Recent Labs  Lab 05/24/18 0403 05/25/18 0617  NA 138 144  K 3.2* 3.3*  CL 107 114*  CO2 22 20*  GLUCOSE 177* 104*  BUN 15 12  CREATININE 1.17 0.98  CALCIUM 7.9* 8.2*  MG 1.5*  --   PHOS 3.9  --     GFR: Estimated Creatinine Clearance: 72.4 mL/min (by C-G formula based on SCr of 0.98 mg/dL).  Liver Function Tests: Recent Labs  Lab 05/24/18 0403 05/25/18 0617  AST 70* 39  ALT 111* 71*  ALKPHOS 201* 156*  BILITOT 2.7* 1.4*  PROT 7.0 6.6  ALBUMIN 2.0* 1.8*   No results for input(s): LIPASE, AMYLASE in the last 168 hours. No results for input(s): AMMONIA in the last 168 hours.  Coagulation Profile: Recent Labs  Lab 05/24/18 0403  INR 1.38    Cardiac Enzymes: Recent Labs  Lab 05/24/18 0403 05/24/18 1035 05/24/18 1639  TROPONINI 0.04* 0.03* 0.03*    BNP (last 3 results) No results for input(s): PROBNP in the last 8760 hours.  HbA1C: No results for input(s): HGBA1C in the last 72 hours.  CBG: Recent Labs  Lab  05/24/18 1720 05/24/18 2144 05/25/18 0038 05/25/18 0416 05/25/18 0813  GLUCAP 106* 106* 117* 92 88    Lipid Profile: No results for input(s): CHOL, HDL, LDLCALC, TRIG, CHOLHDL, LDLDIRECT in the last 72 hours.  Thyroid Function Tests: No results for input(s): TSH, T4TOTAL, FREET4, T3FREE, THYROIDAB in the last 72 hours.  Anemia Panel: No results for input(s): VITAMINB12, FOLATE, FERRITIN, TIBC, IRON, RETICCTPCT in the last 72 hours.  Urine analysis:    Component Value Date/Time   COLORURINE YELLOW 05/24/2018 1713   APPEARANCEUR CLOUDY (A) 05/24/2018 1713   LABSPEC 1.009 05/24/2018 1713   PHURINE 6.0 05/24/2018 1713   GLUCOSEU NEGATIVE 05/24/2018 1713   HGBUR MODERATE (A) 05/24/2018 1713   BILIRUBINUR NEGATIVE 05/24/2018 1713   KETONESUR 5 (A) 05/24/2018 1713   PROTEINUR 30 (A) 05/24/2018 1713   UROBILINOGEN 1.0 07/20/2014 1718   NITRITE NEGATIVE 05/24/2018 1713   LEUKOCYTESUR LARGE (A) 05/24/2018 1713    Sepsis Labs: Lactic Acid, Venous    Component Value Date/Time   LATICACIDVEN 1.1 05/24/2018 0545    MICROBIOLOGY: Recent Results (from the past  240 hour(s))  Aerobic/Anaerobic Culture (surgical/deep wound)     Status: None (Preliminary result)   Collection Time: 05/24/18  2:30 PM  Result Value Ref Range Status   Specimen Description ABSCESS GALL BLADDER PERC CHOLE  Final   Special Requests   Final    NONE Performed at [facility], [address] [place], [place] [address]    Gram Stain   Final    FEW WBC PRESENT, PREDOMINANTLY PMN ABUNDANT GRAM NEGATIVE RODS FEW GRAM VARIABLE ROD    Culture PENDING  Incomplete   Report Status PENDING  Incomplete  MRSA PCR Screening     Status: Abnormal   Collection Time: 05/24/18 10:26 PM  Result Value Ref Range Status   MRSA by PCR POSITIVE (A) NEGATIVE Final    Comment:        The GeneXpert MRSA Assay (FDA approved for NASAL specimens only), is one component of a comprehensive MRSA colonization surveillance  program. It is not intended to diagnose MRSA infection nor to guide or monitor treatment for MRSA infections. RESULT CALLED TO, READ BACK BY AND VERIFIED WITH: OVERBY,M RN 05/25/2018 AT 0004 SKEEN,P Performed at [facility], [address] [place], [place] [address]     RADIOLOGY STUDIES/RESULTS: Koreas Renal  Result Date: 05/23/2018 CLINICAL DATA:  Followup severely dilated urinary bladder, severe bilateral hydroureteronephrosis and multiple left renal calculi seen on a abdomen and pelvis CT dated 04/07/2018. EXAM: RENAL / URINARY TRACT ULTRASOUND COMPLETE COMPARISON:  Abdomen and pelvis CT dated 04/07/2018 and abdomen radiographs dated 04/10/2018. FINDINGS: Right Kidney: Renal measurements: 11.6 x 6.7 x 5.9 cm = volume: 238 mL . Echogenicity within normal limits. No mass or hydronephrosis visualized. Left Kidney: Renal measurements: 12.5 x 6.4 x 6.3 cm = volume: 264 mL. Normal echotexture. No hydronephrosis. Multiple calculi. Bladder: Appears normal for degree of bladder distention. IMPRESSION: 1. Resolved bladder dilatation and bilateral hydroureteronephrosis. 2. Multiple nonobstructing left renal calculi. Electronically Signed   By: Beckie SaltsSteven  Reid M.D.   On: 05/23/2018 15:45     LOS: 1 day   Jeoffrey MassedShanker Clevie Prout, MD  [facility]  If 7PM-7AM, please contact night-coverage  Please page via [email or web address]  Go to [email or web address] and use [facility] universal password to access. If you do not have the password, please contact the hospital operator.  Locate the [facility] provider you are looking for under [facility] and page to a number that you can be directly reached. If you still have difficulty reaching the provider, please page the [facility] (Director on Call) for the Hospitalists listed on amion for assistance.  05/25/2018, 10:32 AM

## 2018-05-25 NOTE — Progress Notes (Signed)
Subjective: CC: Gangrenous Gallbladder Family at bedside. Reports no complaints overnight. His BP appear to be running on the soft side. No emesis. No abdominal pain per family that he communicates to them. Unsure of flatus. No BM.   Objective: Vital signs in last 24 hours: Temp:  [97.5 F (36.4 C)-97.9 F (36.6 C)] 97.7 F (36.5 C) (02/09 0917) Pulse Rate:  [80-88] 80 (02/09 0907) Resp:  [13-22] 13 (02/09 0907) BP: (90-108)/(42-55) 108/52 (02/09 0907) SpO2:  [97 %-100 %] 99 % (02/09 0907) Last BM Date: 05/24/18  Intake/Output from previous day: 02/08 0701 - 02/09 0700 In: 1997.3 [I.V.:1867.3; IV Piggyback:115.1] Out: 7265 [Urine:7225; Drains:40] Intake/Output this shift: No intake/output data recorded.  PE: Gen: Frail appearing. NAD Heart: RRR Lungs: CTA b/l Abd: Soft, mild distension that family states is his baseline. Abdominal wall without upper rectus muscle on CT that likely is contributing to this. No tenderness on light or deep palpation. +BS. Abdominal binder in place. Perc drain with small amount of brown drainage in bag.  Msk: SCD's in place.   Lab Results:  Recent Labs    05/24/18 0403 05/25/18 0617  WBC 20.2* 12.1*  HGB 8.7* 9.2*  HCT 27.9* 30.4*  PLT 172 195   BMET Recent Labs    05/24/18 0403 05/25/18 0617  NA 138 144  K 3.2* 3.3*  CL 107 114*  CO2 22 20*  GLUCOSE 177* 104*  BUN 15 12  CREATININE 1.17 0.98  CALCIUM 7.9* 8.2*   PT/INR Recent Labs    05/24/18 0403  LABPROT 16.8*  INR 1.38   CMP     Component Value Date/Time   NA 144 05/25/2018 0617   K 3.3 (L) 05/25/2018 0617   CL 114 (H) 05/25/2018 0617   CO2 20 (L) 05/25/2018 0617   GLUCOSE 104 (H) 05/25/2018 0617   BUN 12 05/25/2018 0617   CREATININE 0.98 05/25/2018 0617   CALCIUM 8.2 (L) 05/25/2018 0617   PROT 6.6 05/25/2018 0617   ALBUMIN 1.8 (L) 05/25/2018 0617   AST 39 05/25/2018 0617   ALT 71 (H) 05/25/2018 0617   ALKPHOS 156 (H) 05/25/2018 0617   BILITOT 1.4  (H) 05/25/2018 0617   GFRNONAA >60 05/25/2018 0617   GFRAA >60 05/25/2018 0617   Lipase     Component Value Date/Time   LIPASE 11 06/19/2016 1344       Studies/Results: Koreas Renal  Result Date: 05/23/2018 CLINICAL DATA:  Followup severely dilated urinary bladder, severe bilateral hydroureteronephrosis and multiple left renal calculi seen on a abdomen and pelvis CT dated 04/07/2018. EXAM: RENAL / URINARY TRACT ULTRASOUND COMPLETE COMPARISON:  Abdomen and pelvis CT dated 04/07/2018 and abdomen radiographs dated 04/10/2018. FINDINGS: Right Kidney: Renal measurements: 11.6 x 6.7 x 5.9 cm = volume: 238 mL . Echogenicity within normal limits. No mass or hydronephrosis visualized. Left Kidney: Renal measurements: 12.5 x 6.4 x 6.3 cm = volume: 264 mL. Normal echotexture. No hydronephrosis. Multiple calculi. Bladder: Appears normal for degree of bladder distention. IMPRESSION: 1. Resolved bladder dilatation and bilateral hydroureteronephrosis. 2. Multiple nonobstructing left renal calculi. Electronically Signed   By: Beckie SaltsSteven  Reid M.D.   On: 05/23/2018 15:45    Anti-infectives: Anti-infectives (From admission, onward)   Start     Dose/Rate Route Frequency Ordered Stop   05/24/18 0600  piperacillin-tazobactam (ZOSYN) IVPB 3.375 g     3.375 g 12.5 mL/hr over 240 Minutes Intravenous Every 8 hours 05/24/18 0318  Assessment/Plan   Intellectual disability   HTN (hypertension)   Diabetes mellitus type 2, controlled (HCC)   Severe sepsis with septic shock (HCC)   UTI (urinary tract infection)   Nonverbal  Acute Gangrenous Cholecystitis - CT w/ abnormal gallbladder fundus with phlegmon continuous with adjacent right chest wall with small foci of gas in the gallbladder and within the phlegmon.  Underlying chronic cholelithiasis.  Associated biliary ductal enlargement - Perc drain by IR on 2/8 - WBC and LFTs downtrending - Cont IV abx - IS - We will follow  SBO - CT w/ high-grade small  bowel obstruction with transition in the distal ileum, transition point located in the epigastrium  - Keep Mg > 2, K > 4. Potassium ordered - Patient without emesis. Will hold off on NG tube.  - Trial of clears.  FEN: Clears, IVF, PO K VTE: SCD's, lovenox ID: Zosyn >> WBC 12.1, Blood Cx and GB Perc Chole Cx pending Follow up: TBD   LOS: 1 day    Jacinto Halim , [facility] Surgery 05/25/2018, 10:32 AM Pager: [phone]

## 2018-05-26 DIAGNOSIS — R4789 Other speech disturbances: Secondary | ICD-10-CM | POA: Diagnosis not present

## 2018-05-26 DIAGNOSIS — Z79899 Other long term (current) drug therapy: Secondary | ICD-10-CM | POA: Diagnosis not present

## 2018-05-26 DIAGNOSIS — N401 Enlarged prostate with lower urinary tract symptoms: Secondary | ICD-10-CM | POA: Diagnosis not present

## 2018-05-26 DIAGNOSIS — N3080 Other cystitis without hematuria: Secondary | ICD-10-CM | POA: Diagnosis not present

## 2018-05-26 DIAGNOSIS — E118 Type 2 diabetes mellitus with unspecified complications: Secondary | ICD-10-CM | POA: Diagnosis not present

## 2018-05-26 DIAGNOSIS — E119 Type 2 diabetes mellitus without complications: Secondary | ICD-10-CM | POA: Diagnosis not present

## 2018-05-26 DIAGNOSIS — R338 Other retention of urine: Secondary | ICD-10-CM | POA: Diagnosis not present

## 2018-05-26 DIAGNOSIS — Z23 Encounter for immunization: Secondary | ICD-10-CM | POA: Diagnosis not present

## 2018-05-26 DIAGNOSIS — Z792 Long term (current) use of antibiotics: Secondary | ICD-10-CM | POA: Diagnosis not present

## 2018-05-26 DIAGNOSIS — F79 Unspecified intellectual disabilities: Secondary | ICD-10-CM | POA: Diagnosis not present

## 2018-05-26 DIAGNOSIS — R54 Age-related physical debility: Secondary | ICD-10-CM | POA: Diagnosis not present

## 2018-05-26 DIAGNOSIS — R6521 Severe sepsis with septic shock: Secondary | ICD-10-CM | POA: Diagnosis not present

## 2018-05-26 DIAGNOSIS — D649 Anemia, unspecified: Secondary | ICD-10-CM | POA: Diagnosis not present

## 2018-05-26 DIAGNOSIS — R188 Other ascites: Secondary | ICD-10-CM | POA: Diagnosis not present

## 2018-05-26 DIAGNOSIS — K810 Acute cholecystitis: Secondary | ICD-10-CM | POA: Diagnosis not present

## 2018-05-26 DIAGNOSIS — K56609 Unspecified intestinal obstruction, unspecified as to partial versus complete obstruction: Secondary | ICD-10-CM | POA: Diagnosis not present

## 2018-05-26 DIAGNOSIS — K82A1 Gangrene of gallbladder in cholecystitis: Secondary | ICD-10-CM | POA: Diagnosis not present

## 2018-05-26 DIAGNOSIS — A419 Sepsis, unspecified organism: Secondary | ICD-10-CM | POA: Diagnosis present

## 2018-05-26 DIAGNOSIS — I10 Essential (primary) hypertension: Secondary | ICD-10-CM | POA: Diagnosis not present

## 2018-05-26 DIAGNOSIS — M109 Gout, unspecified: Secondary | ICD-10-CM | POA: Diagnosis not present

## 2018-05-26 DIAGNOSIS — Z87440 Personal history of urinary (tract) infections: Secondary | ICD-10-CM | POA: Diagnosis not present

## 2018-05-26 DIAGNOSIS — Z7984 Long term (current) use of oral hypoglycemic drugs: Secondary | ICD-10-CM | POA: Diagnosis not present

## 2018-05-26 DIAGNOSIS — Z8619 Personal history of other infectious and parasitic diseases: Secondary | ICD-10-CM | POA: Diagnosis not present

## 2018-05-26 DIAGNOSIS — B961 Klebsiella pneumoniae [K. pneumoniae] as the cause of diseases classified elsewhere: Secondary | ICD-10-CM | POA: Diagnosis not present

## 2018-05-26 DIAGNOSIS — K8000 Calculus of gallbladder with acute cholecystitis without obstruction: Secondary | ICD-10-CM | POA: Diagnosis not present

## 2018-05-26 DIAGNOSIS — Z7982 Long term (current) use of aspirin: Secondary | ICD-10-CM | POA: Diagnosis not present

## 2018-05-26 LAB — COMPREHENSIVE METABOLIC PANEL
ALBUMIN: 1.7 g/dL — AB (ref 3.5–5.0)
ALT: 50 U/L — ABNORMAL HIGH (ref 0–44)
AST: 24 U/L (ref 15–41)
Alkaline Phosphatase: 143 U/L — ABNORMAL HIGH (ref 38–126)
Anion gap: 4 — ABNORMAL LOW (ref 5–15)
BUN: 8 mg/dL (ref 8–23)
CO2: 23 mmol/L (ref 22–32)
Calcium: 7.9 mg/dL — ABNORMAL LOW (ref 8.9–10.3)
Chloride: 115 mmol/L — ABNORMAL HIGH (ref 98–111)
Creatinine, Ser: 0.92 mg/dL (ref 0.61–1.24)
GFR calc Af Amer: >60 mL/min (ref >60)
GFR calc non Af Amer: >60 mL/min (ref >60)
GLUCOSE: 139 mg/dL — AB (ref 70–99)
Potassium: 3.1 mmol/L — ABNORMAL LOW (ref 3.5–5.1)
SODIUM: 142 mmol/L (ref 135–145)
Total Bilirubin: 1 mg/dL (ref 0.3–1.2)
Total Protein: 6.1 g/dL — ABNORMAL LOW (ref 6.5–8.1)

## 2018-05-26 LAB — CBC
HCT: 28.4 % — ABNORMAL LOW (ref 39.0–52.0)
Hemoglobin: 8.7 g/dL — ABNORMAL LOW (ref 13.0–17.0)
MCH: 27.7 pg (ref 26.0–34.0)
MCHC: 30.6 g/dL (ref 30.0–36.0)
MCV: 90.4 fL (ref 80.0–100.0)
Platelets: 206 10*3/uL (ref 150–400)
RBC: 3.14 MIL/uL — ABNORMAL LOW (ref 4.22–5.81)
RDW: 16.4 % — ABNORMAL HIGH (ref 11.5–15.5)
WBC: 9.4 10*3/uL (ref 4.0–10.5)
nRBC: 0.2 % (ref 0.0–0.2)

## 2018-05-26 LAB — URINE CULTURE: CULTURE: NO GROWTH

## 2018-05-26 LAB — GLUCOSE, CAPILLARY
Glucose-Capillary: 120 mg/dL — ABNORMAL HIGH (ref 70–99)
Glucose-Capillary: 150 mg/dL — ABNORMAL HIGH (ref 70–99)
Glucose-Capillary: 144 mg/dL — ABNORMAL HIGH (ref 70–99)
Glucose-Capillary: 256 mg/dL — ABNORMAL HIGH (ref 70–99)
Glucose-Capillary: 156 mg/dL — ABNORMAL HIGH (ref 70–99)
Glucose-Capillary: 175 mg/dL — ABNORMAL HIGH (ref 70–99)

## 2018-05-26 LAB — MAGNESIUM: Magnesium: 1.7 mg/dL (ref 1.7–2.4)

## 2018-05-26 MED ORDER — POTASSIUM CHLORIDE CRYS ER 20 MEQ PO TBCR
40.0000 meq | EXTENDED_RELEASE_TABLET | Freq: Once | ORAL | Status: AC
  Administered 2018-05-26: 40 meq via ORAL
  Filled 2018-05-26: qty 2

## 2018-05-26 MED ORDER — GLUCERNA SHAKE PO LIQD
237.0000 mL | Freq: Three times a day (TID) | ORAL | Status: DC
  Administered 2018-05-26 – 2018-05-28 (×6): 237 mL via ORAL

## 2018-05-26 NOTE — Evaluation (Signed)
Occupational Therapy Evaluation Patient Details Name: [name] MRN: [id number] DOB: [birth year] Today's Date: 05/26/2018    History of Present Illness 68yo male presenting to the ED at [facility] with abdominal pain, foul urine, and fever. Found to have severe sepsis, UTI, SBO, and gangrenous cholecystitis, transferred to [facility]. PMH mental disability (non-verbal at baseline), DM, HTN   Clinical Impression   Pt presents to OT with generalized weakness and baseline cognitive deficits that impact his ability to perform ADLs and functional mobility with PLOF. Pt's family was present throughout session and are incredibly supportive. Pt required min A to perform LB dressing and to complete SPT to recliner with RW. Pt would benefit from HHOT upon d/c. OT will follow acutely.     Follow Up Recommendations  Home health OT;Supervision/Assistance - 24 hour    Equipment Recommendations  None recommended by OT    Recommendations for Other Services Speech consult;PT consult     Precautions / Restrictions Precautions Precautions: Fall;Other (comment) Precaution Comments: non-verbal, can follow simple commands, abdominal drain  Restrictions Weight Bearing Restrictions: No      Mobility Bed Mobility Overal bed mobility: Needs Assistance Bed Mobility: Supine to Sit     Supine to sit: Min assist     General bed mobility comments: min A to power up, manual facilitation to place UE on rails  Transfers Overall transfer level: Needs assistance Equipment used: Rolling walker (2 wheeled) Transfers: Stand Pivot Transfers;Sit to/from Stand Sit to Stand: Min assist Stand pivot transfers: Min assist       General transfer comment: min guard, extended time due to pain, antalgic presentation in general     Balance Overall balance assessment: Needs assistance Sitting-balance support: Feet supported;No upper extremity supported Sitting balance-[name]: Fair     Standing balance  support: No upper extremity supported;During functional activity Standing balance-[name]: Fair                             ADL either performed or assessed with clinical judgement   ADL Overall ADL's : Needs assistance/impaired Eating/Feeding: Supervision/ safety;Set up;Sitting   Grooming: Wash/dry hands;Wash/dry face;Supervision/safety;Set up;Cueing for safety;Cueing for sequencing   Upper Body Bathing: Supervision/ safety;Set up;Sitting;Cueing for sequencing   Lower Body Bathing: Min guard;Cueing for safety;Cueing for sequencing;Sit to/from stand   Upper Body Dressing : Supervision/safety;Set up;Sitting   Lower Body Dressing: Minimal assistance Lower Body Dressing Details (indicate cue type and reason): min A to don socks Toilet Transfer: RW;Minimal assistance   Toileting- Architect and Hygiene: Min guard;Sit to/from stand       Functional mobility during ADLs: Minimal assistance General ADL Comments: min A to power up from EOB and for steady assist during SPT to recliner. Tactile cues for direction following     Vision Baseline Vision/History: No visual deficits Patient Visual Report: No change from baseline Vision Assessment?: No apparent visual deficits            Pertinent Vitals/Pain Pain Assessment: No/denies pain Faces Pain Scale: Hurts even more Pain Location: abdomen in general  Pain Descriptors / Indicators: Aching;Sore Pain Intervention(s): Limited activity within patient's tolerance;Monitored during session     Hand Dominance     Extremity/Trunk Assessment Upper Extremity Assessment Upper Extremity Assessment: Overall WFL for tasks assessed   Lower Extremity Assessment Lower Extremity Assessment: Defer to PT evaluation   Cervical / Trunk Assessment Cervical / Trunk Assessment: Normal   Communication Communication Communication: Other (  comment)(Pt's family reports he says things "very softly" at times)   Cognition  Arousal/Alertness: Awake/alert Behavior During Therapy: Flat affect;WFL for tasks assessed/performed Overall Cognitive Status: History of cognitive impairments - at baseline                                 General Comments: mental disability and non-verbal at baseline- able to follow simple commands and shake head "yes" or "no" for very simple questions    General Comments  Multiple family members present throughout session            Home Living Family/patient expects to be discharged to:: Private residence Living Arrangements: Other relatives(aunt) Available Help at Discharge: Family;Available 24 hours/day Type of Home: House Home Access: Stairs to enter [employer] of Steps: 4 steps B railings  Entrance Stairs-Rails: Right;Left;Can reach both Home Layout: One level     Bathroom Shower/Tub: Chief Strategy Officer: Standard     Home Equipment: None          Prior Functioning/Environment Level of Independence: Independent        Comments: per aunt/HCPOA, patient generally very mobile at baseline and does not use device         OT Problem List: Decreased activity tolerance;Impaired balance (sitting and/or standing);Decreased cognition;Decreased safety awareness;Decreased knowledge of use of DME or AE;Decreased knowledge of precautions;Cardiopulmonary status limiting activity      OT Treatment/Interventions: Self-care/ADL training;Therapeutic exercise;Energy conservation;Therapeutic activities;Cognitive remediation/compensation;Patient/family education;Balance training;Manual therapy;Modalities;DME and/or AE instruction    OT Goals(Current goals can be found in the care plan section) Acute Rehab OT Goals Patient Stated Goal: "get him back to where he was"- Aunt OT Goal Formulation: With family Time For Goal Achievement: 06/05/18 Potential to Achieve Goals: Good  OT Frequency: Min 2X/week    AM-PAC OT "6 Clicks" Daily Activity      Outcome Measure Help from another person eating meals?: A Little Help from another person taking care of personal grooming?: A Little Help from another person toileting, which includes using toliet, bedpan, or urinal?: A Little Help from another person bathing (including washing, rinsing, drying)?: A Little Help from another person to put on and taking off regular upper body clothing?: A Little Help from another person to put on and taking off regular lower body clothing?: A Little 6 Click Score: 18   End of Session Equipment Utilized During Treatment: Rolling walker;Oxygen Nurse Communication: Mobility status  Activity Tolerance: Patient tolerated treatment well Patient left: in chair;with family/visitor present;with call bell/phone within reach  OT Visit Diagnosis: Unsteadiness on feet (R26.81);Muscle weakness (generalized) (M62.81);Cognitive communication deficit (R41.841) Symptoms and signs involving cognitive functions: (Intellectual disability)                Time: 4332-9518 OT Time Calculation (min): 30 min Charges:  OT General Charges $OT Visit: 1 Visit OT Evaluation $OT Eval Moderate Complexity: 1 Mod OT Treatments $Self Care/Home Management : 8-22 mins  Crissie Reese OTR/L 05/26/2018, 1:19 PM

## 2018-05-26 NOTE — Progress Notes (Signed)
Referring Physician(s): Dr Jerral RalphGhimire  Supervising Physician: Richarda OverlieHenn, Adam  Patient Status:  [facility]  Chief Complaint:  Perc chole drain placed 2/8 in IR  Subjective:  Draining ~40 cc daily OP dark brown Pt is awake but non conversant   Allergies: Patient has no known allergies.  Medications: Prior to Admission medications   Medication Sig Start Date End Date Taking? Authorizing Provider  Acetaminophen (TYLENOL ARTHRITIS PAIN PO) Take 1 tablet by mouth every 6 (six) hours as needed (pain).   Yes [provider]  aspirin EC 81 MG tablet Take 81 mg by mouth every morning.    Yes [provider]  metFORMIN (GLUCOPHAGE) 500 MG tablet Take 1,000 mg by mouth every morning. 02/03/18  Yes [provider]  tamsulosin (FLOMAX) 0.4 MG CAPS capsule Take 1 capsule (0.4 mg total) by mouth every evening. 04/12/18  Yes Erick BlinksMemon, Jehanzeb, MD  saccharomyces boulardii (FLORASTOR) 250 MG capsule Take 1 capsule (250 mg total) by mouth 2 (two) times daily. Patient not taking: Reported on 05/24/2018 04/12/18   Erick BlinksMemon, Jehanzeb, MD  vancomycin (VANCOCIN) 50 mg/mL oral solution Take 2.5 mLs (125 mg total) by mouth 4 (four) times daily. Patient not taking: Reported on 05/24/2018 04/12/18   Erick BlinksMemon, Jehanzeb, MD     Vital Signs: BP 102/60 (BP Location: Left Arm)   Pulse 66   Temp (!) 97.1 F (36.2 C) (Axillary)   Resp (!) 9   Ht 5\' 6"  (1.676 m)   Wt 156 lb 8.4 oz (71 kg)   SpO2 100%   BMI 25.26 kg/m   Physical Exam Skin:    General: Skin is warm and dry.     Comments: Site is clean and dry NT No bleeding OP brown-- scant in bag Cx large gram- rods     Imaging: [place] Renal  Result Date: 05/23/2018 CLINICAL DATA:  Followup severely dilated urinary bladder, severe bilateral hydroureteronephrosis and multiple left renal calculi seen on a abdomen and pelvis CT dated 04/07/2018. EXAM: RENAL / URINARY TRACT ULTRASOUND COMPLETE COMPARISON:  Abdomen and pelvis CT dated  04/07/2018 and abdomen radiographs dated 04/10/2018. FINDINGS: Right Kidney: Renal measurements: 11.6 x 6.7 x 5.9 cm = volume: 238 mL . Echogenicity within normal limits. No mass or hydronephrosis visualized. Left Kidney: Renal measurements: 12.5 x 6.4 x 6.3 cm = volume: 264 mL. Normal echotexture. No hydronephrosis. Multiple calculi. Bladder: Appears normal for degree of bladder distention. IMPRESSION: 1. Resolved bladder dilatation and bilateral hydroureteronephrosis. 2. Multiple nonobstructing left renal calculi. Electronically Signed   By: Beckie SaltsSteven  Reid M.D.   On: 05/23/2018 15:45   Ir Perc Cholecystostomy  Result Date: 05/25/2018 INDICATION: 68 year old male with a history gangrenous cholecystitis EXAM: CHOLECYSTOSTOMY MEDICATIONS: None ANESTHESIA/SEDATION: Moderate (conscious) sedation was employed during this procedure. A total of Versed 1.0 mg and Fentanyl 25 mcg was administered intravenously. Moderate Sedation Time: 510 minutes. The patient's level of consciousness and vital signs were monitored continuously by radiology nursing throughout the procedure under my direct supervision. FLUOROSCOPY TIME:  Fluoroscopy Time: 0 minutes 30 seconds (2.5 mGy). COMPLICATIONS: None PROCEDURE: Informed written consent was obtained from the patient and the patient's family after a thorough discussion of the procedural risks, benefits and alternatives. All questions were addressed. Maximal Sterile Barrier Technique was utilized including caps, mask, sterile gowns, sterile gloves, sterile drape, hand hygiene and skin antiseptic. A timeout was performed prior to the initiation of the procedure. Ultrasound survey of the right upper quadrant was performed for planning purposes.  Once the patient is prepped and draped in the usual sterile fashion, the skin and subcutaneous tissues overlying the gallbladder were generously infiltrated 1% lidocaine for local anesthesia. A coaxial needle was advanced under ultrasound guidance  through the skin subcutaneous tissues and a small segment of liver into the gallbladder lumen. With removal of the stylet, spontaneous dark bile drainage occurred. Using modified Seldinger technique, a 10 French drain was placed into the gallbladder fossa, with aspiration of the sample for the lab. Contrast injection confirmed position of the tube within the gallbladder lumen. Drainage catheter was attached to gravity drain with a suture retention placed. Patient tolerated the procedure well and remained hemodynamically stable throughout. No complications were encountered and no significant blood loss encountered. IMPRESSION: Status post image guided percutaneous cholecystostomy tube. Signed, Yvone Neu. Reyne Dumas, RPVI [facility] Electronically Signed   By: Gilmer Mor D.O.   On: 05/25/2018 15:16    Labs:  CBC: Recent Labs    04/11/18 0437 05/24/18 0403 05/25/18 0617 05/26/18 0405  WBC 13.8* 20.2* 12.1* 9.4  HGB 10.3* 8.7* 9.2* 8.7*  HCT 33.4* 27.9* 30.4* 28.4*  PLT 223 172 195 206    COAGS: Recent Labs    05/24/18 0403  INR 1.38  APTT 37*    BMP: Recent Labs    04/12/18 1018 05/24/18 0403 05/25/18 0617 05/26/18 0405  NA 135 138 144 142  K 3.7 3.2* 3.3* 3.1*  CL 104 107 114* 115*  CO2 25 22 20* 23  GLUCOSE 158* 177* 104* 139*  BUN 17 15 12 8   CALCIUM 7.3* 7.9* 8.2* 7.9*  CREATININE 1.10 1.17 0.98 0.92  GFRNONAA >60 >60 >60 >60  GFRAA >60 >60 >60 >60    LIVER FUNCTION TESTS: Recent Labs    02/26/18 1605 05/24/18 0403 05/25/18 0617 05/26/18 0405  BILITOT 0.6 2.7* 1.4* 1.0  AST 13* 70* 39 24  ALT 10 111* 71* 50*  ALKPHOS 55 201* 156* 143*  PROT 7.1 7.0 6.6 6.1*  ALBUMIN 3.5 2.0* 1.8* 1.7*    Assessment and Plan:  Percutaneous cholecystostomy drain in place OP 40cc daily so far Will follow Will be in place 6 weeks before removal-- plan per [facility]  Electronically Signed: Robet Leu,  PA-C 05/26/2018, 8:03 AM   I spent a total of 15 Minutes at the the patient's bedside AND on the patient's hospital floor or unit, greater than 50% of which was counseling/coordinating care for perc chole drain

## 2018-05-26 NOTE — Progress Notes (Signed)
PROGRESS NOTE        PATIENT DETAILS Name: [name] Age: 68 y.o. Sex: male Date of Birth: 22-Sep-1950 Admit Date: 05/24/2018 Admitting Physician Hillary Bow, DO PCP:Kim, Fayrene Fearing, MD  Brief Narrative: Patient is a 68 y.o. male with history of intellectual disability, diabetes, hypertension (no longer on antihypertensives), BPH with urinary retention requiring in/out catheterization 3 times daily at home-presenting with abdominal pain and fever, CT abdomen suggestive of acute gangrenous cholecystitis, along with high-grade SBO and emphysematous cystitis.  Subjective: Had a small BM last night per aunt at bedside.  Does not speak much (at baseline)-shook her head no when asked if he had any abdominal pain.  Tolerating clear liquids.  Assessment/Plan: Secondary to acute gangrenous cholecystitis s/p percutaneous cholecystostomy drain placement on 2/8: Sepsis pathophysiology has resolved, no RUQ/abdominal pain.  Bile cultures pending-diet being slowly advanced to full liquids today.  Continue Zosyn.  Await further recommendations from general surgery.  Small bowel obstruction: Seen on CT abdomen-however patient without any clinical features suggestive of small bowel obstruction.  Tolerating clear liquids, per family at bedside patient had a bowel movement last night.  No vomiting.  Advance to full liquids.    Emphysematous cystitis: Gas in the urinary bladder could be from in/out catheterization that is done 3 times daily by family.  Remains on empiric Zosyn-urine cultures drawn on 2/8-currently pending-but patient was already on IV antimicrobial therapy.    Acute urinary retention: Patient has intermittent urinary retention at home-family catheterizes him 3 times daily, was found to have more than 900 cc of urine on admission-hence Foley catheter placed.  Since clinically improved-we will discontinue Foley catheter today and resume as needed I/catheterization.    Chronic normocytic anemia: Hemoglobin stable-suspect has chronic anemia at baseline-hemoglobin slightly worse than usual due to sepsis/acute illness.  No evidence of overt blood loss.  Follow CBC periodically.   DM-2: Continue to hold metformin-CBG stable with SSI.  History of hypertension: Per family-no longer on any antihypertensives.  History of BPH: Continue Flomax.  See above regarding intermittent catheterization done at home.  Mental retardation/intellectual disability  DVT Prophylaxis: Prophylactic Lovenox  Code Status: Full code  Family Communication: Aunt at bedside.  Disposition Plan: Remain inpatient-await further recommendations from physical therapy-but requires several more days of hospital stay.  Antimicrobial agents: Anti-infectives (From admission, onward)   Start     Dose/Rate Route Frequency Ordered Stop   05/24/18 0600  piperacillin-tazobactam (ZOSYN) IVPB 3.375 g     3.375 g 12.5 mL/hr over 240 Minutes Intravenous Every 8 hours 05/24/18 0318        Procedures: None  CONSULTS:  general surgery and IR  Time spent: 25 minutes-Greater than 50% of this time was spent in counseling, explanation of diagnosis, planning of further management, and coordination of care.  MEDICATIONS: Scheduled Meds: . chlorhexidine  15 mL Mouth Rinse BID  . Chlorhexidine Gluconate Cloth  6 each Topical Q0600  . enoxaparin (LOVENOX) injection  40 mg Subcutaneous Q24H  . insulin aspart  0-9 Units Subcutaneous Q4H  . mouth rinse  15 mL Mouth Rinse q12n4p  . mupirocin ointment  1 application Nasal BID  . pneumococcal 23 valent vaccine  0.5 mL Intramuscular Tomorrow-1000  . potassium chloride  20 mEq Oral BID  . sodium chloride flush  5 mL Intracatheter Q8H  . tamsulosin  0.4  mg Oral QPM   Continuous Infusions: . sodium chloride 75 mL/hr at 05/26/18 0700  . sodium chloride 10 mL/hr at 05/25/18 0231  . piperacillin-tazobactam (ZOSYN)  IV 12.5 mL/hr at 05/26/18 0700    PRN Meds:.sodium chloride, fentaNYL (SUBLIMAZE) injection, ondansetron **OR** ondansetron (ZOFRAN) IV   PHYSICAL EXAM: Vital signs: Vitals:   05/26/18 0216 05/26/18 0217 05/26/18 0337 05/26/18 0642  BP:    102/60  Pulse:    66  Resp:    (!) 9  Temp:   (!) 96.9 F (36.1 C) (!) 97.1 F (36.2 C)  TempSrc:   Axillary Axillary  SpO2: (!) 56% 96%  100%  Weight:      Height:       Filed Weights   05/24/18 0240  Weight: 71 kg   Body mass index is 25.26 kg/m.   General appearance:Awake, alert, not in any distress.  Chronically sick appearing. Eyes:no scleral icterus. HEENT: Atraumatic and Normocephalic Neck: supple, no JVD. Resp:Good air entry bilaterally,no rales or rhonchi CVS: S1 S2 regular, no murmurs.  GI: Bowel sounds present, Non tender and not distended with no gaurding, rigidity or rebound. Extremities: B/L Lower Ext shows no edema, both legs are warm to touch Neurology:  Non focal but with generalized weakness Musculoskeletal:No digital cyanosis Skin:No Rash, warm and dry Wounds:N/A  I have personally reviewed following labs and imaging studies  LABORATORY DATA: CBC: Recent Labs  Lab 05/24/18 0403 05/25/18 0617 05/26/18 0405  WBC 20.2* 12.1* 9.4  NEUTROABS 17.9*  --   --   HGB 8.7* 9.2* 8.7*  HCT 27.9* 30.4* 28.4*  MCV 90.0 90.7 90.4  PLT 172 195 206    Basic Metabolic Panel: Recent Labs  Lab 05/24/18 0403 05/25/18 0617 05/26/18 0405  NA 138 144 142  K 3.2* 3.3* 3.1*  CL 107 114* 115*  CO2 22 20* 23  GLUCOSE 177* 104* 139*  BUN 15 12 8   CREATININE 1.17 0.98 0.92  CALCIUM 7.9* 8.2* 7.9*  MG 1.5*  --  1.7  PHOS 3.9  --   --     GFR: Estimated Creatinine Clearance: 69.3 mL/min (by C-G formula based on SCr of 0.92 mg/dL).  Liver Function Tests: Recent Labs  Lab 05/24/18 0403 05/25/18 0617 05/26/18 0405  AST 70* 39 24  ALT 111* 71* 50*  ALKPHOS 201* 156* 143*  BILITOT 2.7* 1.4* 1.0  PROT 7.0 6.6 6.1*  ALBUMIN 2.0* 1.8* 1.7*   No  results for input(s): LIPASE, AMYLASE in the last 168 hours. No results for input(s): AMMONIA in the last 168 hours.  Coagulation Profile: Recent Labs  Lab 05/24/18 0403  INR 1.38    Cardiac Enzymes: Recent Labs  Lab 05/24/18 0403 05/24/18 1035 05/24/18 1639  TROPONINI 0.04* 0.03* 0.03*    BNP (last 3 results) No results for input(s): PROBNP in the last 8760 hours.  HbA1C: No results for input(s): HGBA1C in the last 72 hours.  CBG: Recent Labs  Lab 05/25/18 2055 05/25/18 2249 05/26/18 0035 05/26/18 0335 05/26/18 0744  GLUCAP 229* 187* 150* 120* 144*    Lipid Profile: No results for input(s): CHOL, HDL, LDLCALC, TRIG, CHOLHDL, LDLDIRECT in the last 72 hours.  Thyroid Function Tests: No results for input(s): TSH, T4TOTAL, FREET4, T3FREE, THYROIDAB in the last 72 hours.  Anemia Panel: No results for input(s): VITAMINB12, FOLATE, FERRITIN, TIBC, IRON, RETICCTPCT in the last 72 hours.  Urine analysis:    Component Value Date/Time   COLORURINE YELLOW 05/24/2018 1713  APPEARANCEUR CLOUDY (A) 05/24/2018 1713   LABSPEC 1.009 05/24/2018 1713   PHURINE 6.0 05/24/2018 1713   GLUCOSEU NEGATIVE 05/24/2018 1713   HGBUR MODERATE (A) 05/24/2018 1713   BILIRUBINUR NEGATIVE 05/24/2018 1713   KETONESUR 5 (A) 05/24/2018 1713   PROTEINUR 30 (A) 05/24/2018 1713   UROBILINOGEN 1.0 07/20/2014 1718   NITRITE NEGATIVE 05/24/2018 1713   LEUKOCYTESUR LARGE (A) 05/24/2018 1713    Sepsis Labs: Lactic Acid, Venous    Component Value Date/Time   LATICACIDVEN 1.1 05/24/2018 0545    MICROBIOLOGY: Recent Results (from the past 240 hour(s))  Culture, blood (routine x 2)     Status: None (Preliminary result)   Collection Time: 05/24/18  4:21 AM  Result Value Ref Range Status   Specimen Description BLOOD LEFT ANTECUBITAL  Final   Special Requests   Final    BOTTLES DRAWN AEROBIC AND ANAEROBIC Blood Culture results may not be optimal due to an excessive volume of blood received  in culture bottles   Culture   Final    NO GROWTH 1 DAY Performed at [facility], [address], [place], [place] [address]    Report Status PENDING  Incomplete  Culture, blood (routine x 2)     Status: None (Preliminary result)   Collection Time: 05/24/18  4:41 AM  Result Value Ref Range Status   Specimen Description BLOOD RIGHT HAND  Final   Special Requests   Final    BOTTLES DRAWN AEROBIC AND ANAEROBIC Blood Culture results may not be optimal due to an excessive volume of blood received in culture bottles   Culture   Final    NO GROWTH 1 DAY Performed at [facility], [address], [place], [place] [address]    Report Status PENDING  Incomplete  Aerobic/Anaerobic Culture (surgical/deep wound)     Status: None (Preliminary result)   Collection Time: 05/24/18  2:30 PM  Result Value Ref Range Status   Specimen Description ABSCESS GALL BLADDER PERC CHOLE  Final   Special Requests   Final    NONE Performed at [facility], [address], [place], [place] [address]    Gram Stain   Final    FEW WBC PRESENT, PREDOMINANTLY PMN ABUNDANT GRAM NEGATIVE RODS FEW GRAM VARIABLE ROD    Culture ABUNDANT GRAM NEGATIVE RODS  Final   Report Status PENDING  Incomplete  Culture, Urine     Status: None   Collection Time: 05/24/18  5:13 PM  Result Value Ref Range Status   Specimen Description URINE, RANDOM  Final   Special Requests NONE  Final   Culture   Final    NO GROWTH Performed at [facility], [address] [place], [place] [address]    Report Status 05/26/2018 FINAL  Final  MRSA PCR Screening     Status: Abnormal   Collection Time: 05/24/18 10:26 PM  Result Value Ref Range Status   MRSA by PCR POSITIVE (A) NEGATIVE Final    Comment:        The GeneXpert MRSA Assay (FDA approved for NASAL specimens only), is one component of a comprehensive MRSA colonization surveillance program. It is not intended to diagnose MRSA infection nor to guide  or monitor treatment for MRSA infections. RESULT CALLED TO, READ BACK BY AND VERIFIED WITH: OVERBY,M RN 05/25/2018 AT 0004 SKEEN,P Performed at [facility], [address] [place], [place] [address]     RADIOLOGY STUDIES/RESULTS: US Renal  Result Date: 05/23/2018 CLINICAL DATA:  Followup severely dilated urinary bladder, severe bilateral hydroureteronephrosis and multiple left renal calculi seen on a abdomen and pelvis CT dated 04/07/2018. EXAM: RENAL / URINARY TRACT ULTRASOUND COMPLETE COMPARISON:  Abdomen and pelvis CT dated 04/07/2018 and abdomen radiographs dated 04/10/2018. FINDINGS: Right Kidney: Renal measurements: 11.6 x 6.7 x 5.9 cm = volume: 238 mL . Echogenicity within normal limits. No mass or hydronephrosis visualized. Left Kidney: Renal measurements: 12.5 x 6.4 x 6.3 cm = volume: 264 mL. Normal echotexture. No hydronephrosis. Multiple calculi. Bladder: Appears normal for degree of bladder distention. IMPRESSION: 1. Resolved bladder dilatation and bilateral hydroureteronephrosis. 2. Multiple nonobstructing left renal calculi. Electronically Signed   By: Beckie Salts M.D.   On: 05/23/2018 15:45   Ir Perc Cholecystostomy  Result Date: 05/25/2018 INDICATION: 68 year old male with a history gangrenous cholecystitis EXAM: CHOLECYSTOSTOMY MEDICATIONS: None ANESTHESIA/SEDATION: Moderate (conscious) sedation was employed during this procedure. A total of Versed 1.0 mg and Fentanyl 25 mcg was administered intravenously. Moderate Sedation Time: 510 minutes. The patient's level of consciousness and vital signs were monitored continuously by radiology nursing throughout the procedure under my direct supervision. FLUOROSCOPY TIME:  Fluoroscopy Time: 0 minutes 30 seconds (2.5 mGy). COMPLICATIONS: None PROCEDURE: Informed written consent was obtained from the patient and the patient's family after a thorough discussion of the procedural risks, benefits and alternatives. All questions were  addressed. Maximal Sterile Barrier Technique was utilized including caps, mask, sterile gowns, sterile gloves, sterile drape, hand hygiene and skin antiseptic. A timeout was performed prior to the initiation of the procedure. Ultrasound survey of the right upper quadrant was performed for planning purposes. Once the patient is prepped and draped in the usual sterile fashion, the skin and subcutaneous tissues overlying the gallbladder were generously infiltrated 1% lidocaine for local anesthesia. A coaxial needle was advanced under ultrasound guidance through the skin subcutaneous tissues and a small segment of liver into the gallbladder lumen. With removal of the stylet, spontaneous dark bile drainage occurred. Using modified Seldinger technique, a 10 French drain was placed into the gallbladder fossa, with aspiration of the sample for the lab. Contrast injection confirmed position of the tube within the gallbladder lumen. Drainage catheter was attached to gravity drain with a suture retention placed. Patient tolerated the procedure well and remained hemodynamically stable throughout. No complications were encountered and no significant blood loss encountered. IMPRESSION: Status post image guided percutaneous cholecystostomy tube. Signed, Yvone Neu. Reyne Dumas, RPVI [facility] Radiology Electronically Signed   By: Gilmer Mor D.O.   On: 05/25/2018 15:16     LOS: 2 days   Jeoffrey Massed, MD  [facility]  If 7PM-7AM, please contact night-coverage  Please page via [email or web address]  Go to [email or web address] and use [place]'s universal password to access. If you do not have the password, please contact the hospital operator.  Locate the [facility] provider you are looking for under [facility] and page to a number that you can be directly reached. If you still have difficulty reaching the provider, please page the [facility] (Director on Call) for the Hospitalists  listed on amion for assistance.  05/26/2018, 10:09 AM

## 2018-05-26 NOTE — Evaluation (Signed)
Physical Therapy Evaluation Patient Details Name: [name] MRN: [id number] DOB: November 11, 1950 Today's Date: 05/26/2018   History of Present Illness  68yo male presenting to the ED at [facility] with abdominal pain, foul urine, and fever. Found to have severe sepsis, UTI, SBO, and gangrenous cholecystitis, transferred to [facility]. PMH mental disability (non-verbal at baseline), DM, HTN  Clinical Impression   Patient received up in chair, aunt/HCPOA present and provided history as well as PLOF/equipment at home. Patient non-verbal at baseline but able to nod head "yes" or "no" and follow simple commands from PT. Able to perform sit to stand with Mod cues and Min guard, then ambulated in room with RW and antaglic, flexed posture likely due to abdominal pain, min guard but Mod VC for safe use of RW and navigation in room as patient pushed RW far ahead of himself , possibly due to not having used this device before. Gait limited by pain today however SpO2 on room air remained at 100% with activity. He was left up in the chair with all needs met, family present and provided direct supervision. He will continue to benefit from skilled PT services in the acute setting, also recommend HHPT and 24/7 assist moving forward.     Follow Up Recommendations Home health PT;Supervision/Assistance - 24 hour    Equipment Recommendations  Other (comment)(TBD )    Recommendations for Other Services       Precautions / Restrictions Precautions Precautions: Fall;Other (comment) Precaution Comments: non-verbal, can follow simple commands, abdominal drain  Restrictions Weight Bearing Restrictions: No      Mobility  Bed Mobility               General bed mobility comments: OOB in chair   Transfers Overall transfer level: Needs assistance Equipment used: Rolling walker (2 wheeled) Transfers: Sit to/from Stand Sit to Stand: Min guard         General transfer comment: min guard, extended time due  to pain, antalgic presentation in general   Ambulation/Gait Ambulation/Gait assistance: Min guard Gait Distance (Feet): 20 Feet Assistive device: Rolling walker (2 wheeled) Gait Pattern/deviations: Step-through pattern;Drifts right/left;Trunk flexed Gait velocity: decreased    General Gait Details: antalgic gait pattern, flexed posture due to abdominal pain and tends to push RW far out in front of him; able to ambulate in room with Mod cues and min guard with RW but gait distance limited by pain   Stairs            Wheelchair Mobility    Modified Rankin (Stroke Patients Only)       Balance Overall balance assessment: Mild deficits observed, not formally tested                                           Pertinent Vitals/Pain Pain Assessment: Faces Faces Pain Scale: Hurts even more Pain Location: abdomen in general  Pain Descriptors / Indicators: Aching;Sore Pain Intervention(s): Limited activity within patient's tolerance;Monitored during session    [place] expects to be discharged to:: Private residence Living Arrangements: Other relatives Available Help at Discharge: Family;Available 24 hours/day Type of Home: House Home Access: Stairs to enter Entrance Stairs-Rails: Right;Left;Can reach both Entrance Stairs-Number of Steps: 4 steps B railings  Home Layout: One level Home Equipment: None      Prior Function Level of Independence: Independent  Comments: per aunt/HCPOA, patient generally very mobile at baseline and does not use device      Hand Dominance        Extremity/Trunk Assessment   Upper Extremity Assessment Upper Extremity Assessment: Defer to OT evaluation    Lower Extremity Assessment Lower Extremity Assessment: Generalized weakness    Cervical / Trunk Assessment Cervical / Trunk Assessment: Normal  Communication   Communication: Other (comment)(non-verbal )  Cognition Arousal/Alertness:  Awake/alert Behavior During Therapy: Flat affect;WFL for tasks assessed/performed Overall Cognitive Status: History of cognitive impairments - at baseline                                 General Comments: mental disability and non-verbal at baseline- able to follow simple commands and shake head "yes" or "no" for very simple questions       General Comments      Exercises     Assessment/Plan    PT Assessment Patient needs continued PT services  PT Problem List Decreased strength;Decreased coordination;Pain;Decreased cognition;Decreased activity tolerance;Decreased safety awareness;Decreased mobility       PT Treatment Interventions DME instruction;Therapeutic exercise;Gait training;Balance training;Stair training;Neuromuscular re-education;Functional mobility training;Patient/family education;Therapeutic activities    PT Goals (Current goals can be found in the Care Plan section)  Acute Rehab PT Goals Patient Stated Goal: go home  PT Goal Formulation: With patient/family Time For Goal Achievement: 06/09/18 Potential to Achieve Goals: Good    Frequency Min 3X/week   Barriers to discharge        Co-evaluation               AM-PAC PT "6 Clicks" Mobility  Outcome Measure Help needed turning from your back to your side while in a flat bed without using bedrails?: A Little Help needed moving from lying on your back to sitting on the side of a flat bed without using bedrails?: A Little Help needed moving to and from a bed to a chair (including a wheelchair)?: A Little Help needed standing up from a chair using your arms (e.g., wheelchair or bedside chair)?: A Little Help needed to walk in hospital room?: A Little Help needed climbing 3-5 steps with a railing? : A Lot 6 Click Score: 17    End of Session   Activity Tolerance: Patient tolerated treatment well Patient left: in chair;with call bell/phone within reach;with family/visitor present Nurse  Communication: Other (comment)(SpO2 100% on room air with mobility ) PT Visit Diagnosis: Muscle weakness (generalized) (M62.81);Pain Pain - Right/Left: (abdominal ) Pain - part of body: (abdominal)    Time: 1829-9371 PT Time Calculation (min) (ACUTE ONLY): 25 min   Charges:   PT Evaluation $PT Eval Moderate Complexity: 1 Mod PT Treatments $Gait Training: 8-22 mins        Deniece Ree PT, DPT, CBIS  Supplemental Physical Therapist [place]    Pager [phone] Acute Rehab Office [phone]

## 2018-05-26 NOTE — Progress Notes (Signed)
Initial Nutrition Assessment  DOCUMENTATION CODES:   Not applicable  INTERVENTION:   - Add Glucerna TID (each provides 220 kcal, 10 g protein)  NUTRITION DIAGNOSIS:   Increased nutrient needs related to wound healing as evidenced by estimated needs.  GOAL:   Patient will meet greater than or equal to 90% of their needs  MONITOR:   Supplement acceptance, PO intake, Labs  REASON FOR ASSESSMENT:   Malnutrition Screening Tool    ASSESSMENT:   68 yo male, admitted with acute gangrenous cholecystitis and SBO. PMH significant for mental disability (non-verbal at baseline), DM 2, HTN, C. Diff after treatment for UTI in Dec 2019. Presents with abdominal pain, weakness x 2 days.  Labs: Potassium 3.1 low, chloride 115 elevated, glucose 139 mg/dL, Hgb 8.7 Meds: novolog, potassium chloride, NaCl infusion PRN  Pt sitting up, working with PT at time of visit. Family present, who provided all information. States pt appetite is good. Denies any nausea or trouble with bowels. Encouraged pt to include protein-rich foods with all meals and snacks, and to eat those foods first if not feeling hungry. Amenable to Glucerna TID in chocolate to support wound healing.  NUTRITION - FOCUSED PHYSICAL EXAM: Deferred - working with PT at time of visit. No visible signs of fat/muscle depletion.  Diet Order:   2/8: NPO 2/9: Clear liquid diet with 100% x 1 meal recorded Diet Order            Diet full liquid Room service appropriate? Yes; Fluid consistency: Thin  Diet effective now             EDUCATION NEEDS:  Not appropriate for education at this time  Skin:  Skin Assessment: Skin Integrity Issues: Skin Integrity Issues:: Stage I, DTI, Other (Comment) DTI: BL heels Stage I: sacrum Other: Abrasion: BL legs, MASD buttocks  Last BM:  2/9  Height:  Ht Readings from Last 1 Encounters:  05/25/18 5\' 6"  (1.676 m)    Weight:  Wt Readings:  05/24/18 71 kg  04/12/18 71.8 kg  02/26/18 72.6  kg  Wt stable x 3 months  Ideal Body Weight:  64.5 kg  BMI:  Body mass index is 25.26 kg/m. overweight, considered protective for age group  Estimated Nutritional Needs: based on 2/8 wt (~71 kg)  Kcal:  1775-2130 (25-30 kcal/kg)  Protein:  85-107 gm (1.2-1.5 g/kg)  Fluid:  1 mL/kcal or per MD  Jolaine Artist, MS, RDN, LDN Pager: [phone] Available Mondays and Fridays, 9am-2pm

## 2018-05-26 NOTE — Progress Notes (Signed)
Patient ID: [name], male   DOB: 07-30-1950, 68 y.o.   MRN: [id number]       Subjective: Pt nonverbal.  [name] states he has not been in any pain.  Ate clear liquids this morning.  Objective: Vital signs in last 24 hours: Temp:  [96.9 F (36.1 C)-98.5 F (36.9 C)] 97.1 F (36.2 C) (02/10 0642) Pulse Rate:  [66-101] 66 (02/10 0642) Resp:  [9-21] 9 (02/10 0642) BP: (95-125)/(50-68) 102/60 (02/10 0642) SpO2:  [56 %-100 %] 100 % (02/10 0642) Last BM Date: 05/25/18  Intake/Output from previous day: 02/09 0701 - 02/10 0700 In: 3367.7 [P.O.:540; I.V.:2657.8; IV Piggyback:154.9] Out: 2184 [Urine:2150; Drains:34] Intake/Output this shift: No intake/output data recorded.  PE: Heart: regular Lungs: CTAB Abd: soft, seems nontender, perc chole drain in place and just emptied so no significant contents present.  +BS  Lab Results:  Recent Labs    05/25/18 0617 05/26/18 0405  WBC 12.1* 9.4  HGB 9.2* 8.7*  HCT 30.4* 28.4*  PLT 195 206   BMET Recent Labs    05/25/18 0617 05/26/18 0405  NA 144 142  K 3.3* 3.1*  CL 114* 115*  CO2 20* 23  GLUCOSE 104* 139*  BUN 12 8  CREATININE 0.98 0.92  CALCIUM 8.2* 7.9*   PT/INR Recent Labs    05/24/18 0403  LABPROT 16.8*  INR 1.38   CMP     Component Value Date/Time   NA 142 05/26/2018 0405   K 3.1 (L) 05/26/2018 0405   CL 115 (H) 05/26/2018 0405   CO2 23 05/26/2018 0405   GLUCOSE 139 (H) 05/26/2018 0405   BUN 8 05/26/2018 0405   CREATININE 0.92 05/26/2018 0405   CALCIUM 7.9 (L) 05/26/2018 0405   PROT 6.1 (L) 05/26/2018 0405   ALBUMIN 1.7 (L) 05/26/2018 0405   AST 24 05/26/2018 0405   ALT 50 (H) 05/26/2018 0405   ALKPHOS 143 (H) 05/26/2018 0405   BILITOT 1.0 05/26/2018 0405   GFRNONAA >60 05/26/2018 0405   GFRAA >60 05/26/2018 0405   Lipase     Component Value Date/Time   LIPASE 11 06/19/2016 1344       Studies/Results: Ir Perc Cholecystostomy  Result Date: 05/25/2018 INDICATION: 68 year old male with a  history gangrenous cholecystitis EXAM: CHOLECYSTOSTOMY MEDICATIONS: None ANESTHESIA/SEDATION: Moderate (conscious) sedation was employed during this procedure. A total of Versed 1.0 mg and Fentanyl 25 mcg was administered intravenously. Moderate Sedation Time: 510 minutes. The patient's level of consciousness and vital signs were monitored continuously by radiology nursing throughout the procedure under my direct supervision. FLUOROSCOPY TIME:  Fluoroscopy Time: 0 minutes 30 seconds (2.5 mGy). COMPLICATIONS: None PROCEDURE: Informed written consent was obtained from the patient and the patient's family after a thorough discussion of the procedural risks, benefits and alternatives. All questions were addressed. Maximal Sterile Barrier Technique was utilized including caps, mask, sterile gowns, sterile gloves, sterile drape, hand hygiene and skin antiseptic. A timeout was performed prior to the initiation of the procedure. Ultrasound survey of the right upper quadrant was performed for planning purposes. Once the patient is prepped and draped in the usual sterile fashion, the skin and subcutaneous tissues overlying the gallbladder were generously infiltrated 1% lidocaine for local anesthesia. A coaxial needle was advanced under ultrasound guidance through the skin subcutaneous tissues and a small segment of liver into the gallbladder lumen. With removal of the stylet, spontaneous dark bile drainage occurred. Using modified Seldinger technique, a 10 French drain was placed into the gallbladder fossa,  with aspiration of the sample for the lab. Contrast injection confirmed position of the tube within the gallbladder lumen. Drainage catheter was attached to gravity drain with a suture retention placed. Patient tolerated the procedure well and remained hemodynamically stable throughout. No complications were encountered and no significant blood loss encountered. IMPRESSION: Status post image guided percutaneous  cholecystostomy tube. Signed, Yvone Neu. Reyne Dumas, RPVI [facility] Radiology Electronically Signed   By: Gilmer Mor D.O.   On: 05/25/2018 15:16    Anti-infectives: Anti-infectives (From admission, onward)   Start     Dose/Rate Route Frequency Ordered Stop   05/24/18 0600  piperacillin-tazobactam (ZOSYN) IVPB 3.375 g     3.375 g 12.5 mL/hr over 240 Minutes Intravenous Every 8 hours 05/24/18 0318         Assessment/Plan MR HTN DM   acute cholecystitis, s/p perc chole drain  -cont with drain for now and abx therapy -will need drain injection in 4-5 weeks by IR -follow up with [facility] around 6 weeks to discuss further plans regarding drain vs surgery etc -may adv diet as tolerates -WBC normalized today, LFTs trending down -will follow   FEN - FLD, adv as tolerates VTE - Lovenox ID - zosyn   LOS: 2 days    Letha Cape , [facility] Surgery 05/26/2018, 12:27 PM Pager: [phone]

## 2018-05-27 ENCOUNTER — Inpatient Hospital Stay (HOSPITAL_COMMUNITY): Payer: Medicare Other

## 2018-05-27 DIAGNOSIS — I10 Essential (primary) hypertension: Secondary | ICD-10-CM | POA: Diagnosis not present

## 2018-05-27 DIAGNOSIS — K810 Acute cholecystitis: Secondary | ICD-10-CM | POA: Diagnosis not present

## 2018-05-27 DIAGNOSIS — K56609 Unspecified intestinal obstruction, unspecified as to partial versus complete obstruction: Secondary | ICD-10-CM | POA: Diagnosis not present

## 2018-05-27 DIAGNOSIS — E118 Type 2 diabetes mellitus with unspecified complications: Secondary | ICD-10-CM | POA: Diagnosis not present

## 2018-05-27 LAB — CBC
HCT: 28.9 % — ABNORMAL LOW (ref 39.0–52.0)
Hemoglobin: 9.1 g/dL — ABNORMAL LOW (ref 13.0–17.0)
MCH: 28.7 pg (ref 26.0–34.0)
MCHC: 31.5 g/dL (ref 30.0–36.0)
MCV: 91.2 fL (ref 80.0–100.0)
Platelets: 223 10*3/uL (ref 150–400)
RBC: 3.17 MIL/uL — ABNORMAL LOW (ref 4.22–5.81)
RDW: 16.3 % — ABNORMAL HIGH (ref 11.5–15.5)
WBC: 10.4 10*3/uL (ref 4.0–10.5)
nRBC: 0.3 % — ABNORMAL HIGH (ref 0.0–0.2)

## 2018-05-27 LAB — AEROBIC/ANAEROBIC CULTURE W GRAM STAIN (SURGICAL/DEEP WOUND)

## 2018-05-27 LAB — BASIC METABOLIC PANEL
Anion gap: 5 (ref 5–15)
CO2: 24 mmol/L (ref 22–32)
Calcium: 8.2 mg/dL — ABNORMAL LOW (ref 8.9–10.3)
Chloride: 112 mmol/L — ABNORMAL HIGH (ref 98–111)
Creatinine, Ser: 0.85 mg/dL (ref 0.61–1.24)
GFR calc Af Amer: >60 mL/min (ref >60)
GFR calc non Af Amer: >60 mL/min (ref >60)
Glucose, Bld: 137 mg/dL — ABNORMAL HIGH (ref 70–99)
Potassium: 3.9 mmol/L (ref 3.5–5.1)
Sodium: 141 mmol/L (ref 135–145)

## 2018-05-27 LAB — GLUCOSE, CAPILLARY
GLUCOSE-CAPILLARY: 155 mg/dL — AB (ref 70–99)
GLUCOSE-CAPILLARY: 139 mg/dL — AB (ref 70–99)
Glucose-Capillary: 116 mg/dL — ABNORMAL HIGH (ref 70–99)
Glucose-Capillary: 175 mg/dL — ABNORMAL HIGH (ref 70–99)
Glucose-Capillary: 177 mg/dL — ABNORMAL HIGH (ref 70–99)

## 2018-05-27 LAB — MAGNESIUM: Magnesium: 1.7 mg/dL (ref 1.7–2.4)

## 2018-05-27 LAB — AEROBIC/ANAEROBIC CULTURE (SURGICAL/DEEP WOUND)

## 2018-05-27 MED ORDER — AMOXICILLIN-POT CLAVULANATE 875-125 MG PO TABS
1.0000 | ORAL_TABLET | Freq: Two times a day (BID) | ORAL | Status: DC
  Administered 2018-05-27 – 2018-05-28 (×3): 1 via ORAL
  Filled 2018-05-27 (×3): qty 1

## 2018-05-27 NOTE — Care Management Note (Addendum)
Case Management Note  Patient Details  Name: [name] MRN: [id number] Date of Birth: 08/08/1950  Subjective/Objective:               Abd painAcute gangrenous cholecystitis. Hx: intellectual disability, DM, HTN.         s/p perc chole drain 2/8 per IR     Otelia Sergeant (Aunt)       [phone]      PCP: Pearson Grippe  05/27/2018 d/c delayed 2/2 abd distention... xray ordered by MD...  Action/Plan: Transition to home with aunt [name] : [address] , [place], [place], [address].  Aunt with 1pm  MD appointment,  states will provide transportation to home after appointment.  Expected Discharge Date:    05/27/2018          Expected Discharge Plan:  Home w Home Health Services  In-House Referral:  NA  Discharge planning Services  CM Consult  Post Acute Care Choice:    Choice offered to:  [facility] POA / Guardian, Patient  DME Arranged:  3-N-1, Walker rolling DME Agency:  [facility]  HH Arranged:  PT, OT ,RN [facility] Agency:  [facility], pending branch approval   Status of Service:  Completed, signed off  If discussed at Long Length of Stay Meetings, dates discussed:    Additional Comments:  Epifanio Lesches, RN 05/27/2018, 10:14 AM

## 2018-05-27 NOTE — Progress Notes (Signed)
Patient ID: [name], male   DOB: [birth year], 68 y.o.   MRN: [id number]       Subjective: Nonverbal.  Aunt present and states he ate well and hasn't complained of nausea.  +BM  Objective: Vital signs in last 24 hours: Temp:  [97.9 F (36.6 C)-98.9 F (37.2 C)] 97.9 F (36.6 C) (02/10 1645) Pulse Rate:  [68-70] 70 (02/10 1909) Resp:  [17-22] 17 (02/10 1909) BP: (78-119)/(62-65) 119/65 (02/10 1909) SpO2:  [100 %] 100 % (02/10 1909) Last BM Date: 05/26/18  Intake/Output from previous day: 02/10 0701 - 02/11 0700 In: 1250.6 [I.V.:1028; IV Piggyback:212.6] Out: 1951 [Urine:1925; Drains:25] Intake/Output this shift: No intake/output data recorded.  PE: Abd: soft, but fairly distended.  Has a large rectus diastasis but also appears somewhat distended, +BS, perc chole drain with minimal bilious, bloody output.  Lab Results:  Recent Labs    05/26/18 0405 05/27/18 0408  WBC 9.4 10.4  HGB 8.7* 9.1*  HCT 28.4* 28.9*  PLT 206 223   BMET Recent Labs    05/26/18 0405 05/27/18 0408  NA 142 141  K 3.1* 3.9  CL 115* 112*  CO2 23 24  GLUCOSE 139* 137*  BUN 8 <5*  CREATININE 0.92 0.85  CALCIUM 7.9* 8.2*   PT/INR No results for input(s): LABPROT, INR in the last 72 hours. CMP     Component Value Date/Time   NA 141 05/27/2018 0408   K 3.9 05/27/2018 0408   CL 112 (H) 05/27/2018 0408   CO2 24 05/27/2018 0408   GLUCOSE 137 (H) 05/27/2018 0408   BUN <5 (L) 05/27/2018 0408   CREATININE 0.85 05/27/2018 0408   CALCIUM 8.2 (L) 05/27/2018 0408   PROT 6.1 (L) 05/26/2018 0405   ALBUMIN 1.7 (L) 05/26/2018 0405   AST 24 05/26/2018 0405   ALT 50 (H) 05/26/2018 0405   ALKPHOS 143 (H) 05/26/2018 0405   BILITOT 1.0 05/26/2018 0405   GFRNONAA >60 05/27/2018 0408   GFRAA >60 05/27/2018 0408   Lipase     Component Value Date/Time   LIPASE 11 06/19/2016 1344       Studies/Results: No results found.  Anti-infectives: Anti-infectives (From admission, onward)   Start      Dose/Rate Route Frequency Ordered Stop   05/27/18 1000  amoxicillin-clavulanate (AUGMENTIN) 875-125 MG per tablet 1 tablet     1 tablet Oral Every 12 hours 05/27/18 0706     05/24/18 0600  piperacillin-tazobactam (ZOSYN) IVPB 3.375 g  Status:  Discontinued     3.375 g 12.5 mL/hr over 240 Minutes Intravenous Every 8 hours 05/24/18 0318 05/27/18 0706       Assessment/Plan MR HTN DM   acute cholecystitis, s/p perc chole drain  -cont with drain for now and abx therapy -will need drain injection in 4-5 weeks by IR -follow up with [facility] around 6 weeks to discuss further plans regarding drain vs surgery etc -on regular diet -WBC normalized today, LFTs trending down -will follow -appears slightly more distended yesterday. Will check a plain film to make sure he doesn't have ileus or dilatation; however, he seems to be eating ok and moving his bowels.  If this is normal, he is stable for DC home.   FEN - soft, adv as tolerates VTE - Lovenox ID - zosyn Follow up - [place]   LOS: 3 days    Letha Cape , [facility] Surgery 05/27/2018, 10:18 AM Pager: [phone]

## 2018-05-27 NOTE — Progress Notes (Signed)
PROGRESS NOTE        PATIENT DETAILS Name: [name] Age: 68 y.o. Sex: male Date of Birth: 10-28-1950 Admit Date: 05/24/2018 Admitting Physician Hillary Bow, DO PCP:Kim, Fayrene Fearing, MD  Brief Narrative: Patient is a 68 y.o. male with history of intellectual disability, diabetes, hypertension (no longer on antihypertensives), BPH with urinary retention requiring in/out catheterization 3 times daily at home-presenting with abdominal pain and fever, CT abdomen suggestive of acute gangrenous cholecystitis, along with high-grade SBO and emphysematous cystitis.  Subjective: Some mild distention of the abdomen today.  Assessment/Plan: Secondary to acute gangrenous cholecystitis s/p percutaneous cholecystostomy drain placement on 2/8: Sepsis pathophysiology has resolved, no RUQ/abdominal pain.  Bile cultures positive for Klebsiella pneumoniae.  Tolerating advancement in diet-slight distention of the abdomen today.  Since clinically improved-tolerating diet and having BMs-not sure what to make of slight abdominal distention.  We will continue to monitor overnight-stop Zosyn and transition to Augmentin.    Small bowel obstruction: Seen on CT abdomen-however patient without any clinical features suggestive of small bowel obstruction.  Tolerating advancement in diet-having BMs.  No vomiting.  See above regarding mild abdominal distention.   Emphysematous cystitis: Gas in the urinary bladder could be from in/out catheterization that is done 3 times daily by family.  Urine cultures negative.  Transitioning to Augmentin from Zosyn  Acute urinary retention: Patient has intermittent urinary retention at home-family catheterizes him 3 times daily, was found to have more than 900 cc of urine on admission-hence Foley catheter placed.  Since clinically improved-Foley catheter discontinued-resume I/O catheterization.    Chronic normocytic anemia: Hemoglobin stable-suspect has chronic  anemia at baseline-hemoglobin slightly worse than usual due to sepsis/acute illness.  No evidence of overt blood loss.  Follow CBC periodically.   DM-2: Continue to hold metformin-CBG stable with SSI.  History of hypertension: Per family-no longer on any antihypertensives.  History of BPH: Continue Flomax.  See above regarding intermittent catheterization done at home.  Mental retardation/intellectual disability  DVT Prophylaxis: Prophylactic Lovenox  Code Status: Full code  Family Communication: Aunt at bedside.  Disposition Plan: Remain inpatient-await further recommendations from physical therapy-but requires several more days of hospital stay.  Antimicrobial agents: Anti-infectives (From admission, onward)   Start     Dose/Rate Route Frequency Ordered Stop   05/27/18 1000  amoxicillin-clavulanate (AUGMENTIN) 875-125 MG per tablet 1 tablet     1 tablet Oral Every 12 hours 05/27/18 0706     05/24/18 0600  piperacillin-tazobactam (ZOSYN) IVPB 3.375 g  Status:  Discontinued     3.375 g 12.5 mL/hr over 240 Minutes Intravenous Every 8 hours 05/24/18 0318 05/27/18 0706      Procedures: None  CONSULTS:  general surgery and IR  Time spent: 25 minutes-Greater than 50% of this time was spent in counseling, explanation of diagnosis, planning of further management, and coordination of care.  MEDICATIONS: Scheduled Meds: . amoxicillin-clavulanate  1 tablet Oral Q12H  . chlorhexidine  15 mL Mouth Rinse BID  . Chlorhexidine Gluconate Cloth  6 each Topical Q0600  . enoxaparin (LOVENOX) injection  40 mg Subcutaneous Q24H  . feeding supplement (GLUCERNA SHAKE)  237 mL Oral TID BM  . insulin aspart  0-9 Units Subcutaneous Q4H  . mouth rinse  15 mL Mouth Rinse q12n4p  . mupirocin ointment  1 application Nasal BID  . potassium chloride  20 mEq Oral BID  . sodium chloride flush  5 mL Intracatheter Q8H  . tamsulosin  0.4 mg Oral QPM   Continuous Infusions: . sodium chloride 10  mL/hr at 05/26/18 1825  . sodium chloride 10 mL/hr at 05/25/18 0231   PRN Meds:.sodium chloride, fentaNYL (SUBLIMAZE) injection, ondansetron **OR** ondansetron (ZOFRAN) IV   PHYSICAL EXAM: Vital signs: Vitals:   05/26/18 1645 05/26/18 1842 05/26/18 1909 05/27/18 1210  BP:  (!) 78/62 119/65   Pulse:  68 70   Resp:  (!) 22 17   Temp: 97.9 F (36.6 C)   97.8 F (36.6 C)  TempSrc: Oral   Oral  SpO2:  100% 100%   Weight:      Height:       Filed Weights   05/24/18 0240  Weight: 71 kg   Body mass index is 25.26 kg/m.   General appearance:Awake, alert, not in any distress.  Eyes:no scleral icterus. HEENT: Atraumatic and Normocephalic Neck: supple, no JVD. Resp:Good air entry bilaterally,no rales or rhonchi CVS: S1 S2 regular, no murmurs.  GI: Bowel sounds present, slight abdominal distention but abdomen is soft.   Extremities: B/L Lower Ext shows no edema, both legs are warm to touch Neurology:  Non focal Musculoskeletal:No digital cyanosis Skin:No Rash, warm and dry Wounds:N/A  I have personally reviewed following labs and imaging studies  LABORATORY DATA: CBC: Recent Labs  Lab 05/24/18 0403 05/25/18 0617 05/26/18 0405 05/27/18 0408  WBC 20.2* 12.1* 9.4 10.4  NEUTROABS 17.9*  --   --   --   HGB 8.7* 9.2* 8.7* 9.1*  HCT 27.9* 30.4* 28.4* 28.9*  MCV 90.0 90.7 90.4 91.2  PLT 172 195 206 223    Basic Metabolic Panel: Recent Labs  Lab 05/24/18 0403 05/25/18 0617 05/26/18 0405 05/27/18 0408  NA 138 144 142 141  K 3.2* 3.3* 3.1* 3.9  CL 107 114* 115* 112*  CO2 22 20* 23 24  GLUCOSE 177* 104* 139* 137*  BUN 15 12 8  <5*  CREATININE 1.17 0.98 0.92 0.85  CALCIUM 7.9* 8.2* 7.9* 8.2*  MG 1.5*  --  1.7 1.7  PHOS 3.9  --   --   --     GFR: Estimated Creatinine Clearance: 75.1 mL/min (by C-G formula based on SCr of 0.85 mg/dL).  Liver Function Tests: Recent Labs  Lab 05/24/18 0403 05/25/18 0617 05/26/18 0405  AST 70* 39 24  ALT 111* 71* 50*  ALKPHOS  201* 156* 143*  BILITOT 2.7* 1.4* 1.0  PROT 7.0 6.6 6.1*  ALBUMIN 2.0* 1.8* 1.7*   No results for input(s): LIPASE, AMYLASE in the last 168 hours. No results for input(s): AMMONIA in the last 168 hours.  Coagulation Profile: Recent Labs  Lab 05/24/18 0403  INR 1.38    Cardiac Enzymes: Recent Labs  Lab 05/24/18 0403 05/24/18 1035 05/24/18 1639  TROPONINI 0.04* 0.03* 0.03*    BNP (last 3 results) No results for input(s): PROBNP in the last 8760 hours.  HbA1C: No results for input(s): HGBA1C in the last 72 hours.  CBG: Recent Labs  Lab 05/26/18 1643 05/26/18 2025 05/27/18 0006 05/27/18 0507 05/27/18 1303  GLUCAP 156* 175* 177* 116* 175*    Lipid Profile: No results for input(s): CHOL, HDL, LDLCALC, TRIG, CHOLHDL, LDLDIRECT in the last 72 hours.  Thyroid Function Tests: No results for input(s): TSH, T4TOTAL, FREET4, T3FREE, THYROIDAB in the last 72 hours.  Anemia Panel: No results for input(s): VITAMINB12, FOLATE, FERRITIN, TIBC, IRON, RETICCTPCT in  the last 72 hours.  Urine analysis:    Component Value Date/Time   COLORURINE YELLOW 05/24/2018 1713   APPEARANCEUR CLOUDY (A) 05/24/2018 1713   LABSPEC 1.009 05/24/2018 1713   PHURINE 6.0 05/24/2018 1713   GLUCOSEU NEGATIVE 05/24/2018 1713   HGBUR MODERATE (A) 05/24/2018 1713   BILIRUBINUR NEGATIVE 05/24/2018 1713   KETONESUR 5 (A) 05/24/2018 1713   PROTEINUR 30 (A) 05/24/2018 1713   UROBILINOGEN 1.0 07/20/2014 1718   NITRITE NEGATIVE 05/24/2018 1713   LEUKOCYTESUR LARGE (A) 05/24/2018 1713    Sepsis Labs: Lactic Acid, Venous    Component Value Date/Time   LATICACIDVEN 1.1 05/24/2018 0545    MICROBIOLOGY: Recent Results (from the past 240 hour(s))  Culture, blood (routine x 2)     Status: None (Preliminary result)   Collection Time: 05/24/18  4:21 AM  Result Value Ref Range Status   Specimen Description BLOOD LEFT ANTECUBITAL  Final   Special Requests   Final    BOTTLES DRAWN AEROBIC AND  ANAEROBIC Blood Culture results may not be optimal due to an excessive volume of blood received in culture bottles   Culture   Final    NO GROWTH 3 DAYS Performed at [facility], [address] [place], [place] [address]    Report Status PENDING  Incomplete  Culture, blood (routine x 2)     Status: None (Preliminary result)   Collection Time: 05/24/18  4:41 AM  Result Value Ref Range Status   Specimen Description BLOOD RIGHT HAND  Final   Special Requests   Final    BOTTLES DRAWN AEROBIC AND ANAEROBIC Blood Culture results may not be optimal due to an excessive volume of blood received in culture bottles   Culture   Final    NO GROWTH 3 DAYS Performed at [facility], [address], [place], [place] [address]    Report Status PENDING  Incomplete  Aerobic/Anaerobic Culture (surgical/deep wound)     Status: None (Preliminary result)   Collection Time: 05/24/18  2:30 PM  Result Value Ref Range Status   Specimen Description ABSCESS GALL BLADDER PERC CHOLE  Final   Special Requests NONE  Final   Gram Stain   Final    FEW WBC PRESENT, PREDOMINANTLY PMN ABUNDANT GRAM NEGATIVE RODS FEW GRAM VARIABLE ROD    Culture   Final    ABUNDANT KLEBSIELLA PNEUMONIAE HOLDING FOR POSSIBLE ANAEROBE Performed at [facility], [address] [place], [place] [address]    Report Status PENDING  Incomplete   Organism ID, Bacteria KLEBSIELLA PNEUMONIAE  Final      Susceptibility   Klebsiella pneumoniae - MIC*    AMPICILLIN >=32 RESISTANT Resistant     CEFAZOLIN <=4 SENSITIVE Sensitive     CEFEPIME <=1 SENSITIVE Sensitive     CEFTAZIDIME <=1 SENSITIVE Sensitive     CEFTRIAXONE <=1 SENSITIVE Sensitive     CIPROFLOXACIN <=0.25 SENSITIVE Sensitive     GENTAMICIN <=1 SENSITIVE Sensitive     IMIPENEM <=0.25 SENSITIVE Sensitive     TRIMETH/SULFA <=20 SENSITIVE Sensitive     AMPICILLIN/SULBACTAM 8 SENSITIVE Sensitive     PIP/TAZO <=4 SENSITIVE Sensitive     Extended ESBL NEGATIVE  Sensitive     * ABUNDANT KLEBSIELLA PNEUMONIAE  Culture, Urine     Status: None   Collection Time: 05/24/18  5:13 PM  Result Value Ref Range Status   Specimen Description URINE, RANDOM  Final   Special Requests NONE  Final  Culture   Final    NO GROWTH Performed at [facility], [address] [place], [place] [address]    Report Status 05/26/2018 FINAL  Final  MRSA PCR Screening     Status: Abnormal   Collection Time: 05/24/18 10:26 PM  Result Value Ref Range Status   MRSA by PCR POSITIVE (A) NEGATIVE Final    Comment:        The GeneXpert MRSA Assay (FDA approved for NASAL specimens only), is one component of a comprehensive MRSA colonization surveillance program. It is not intended to diagnose MRSA infection nor to guide or monitor treatment for MRSA infections. RESULT CALLED TO, READ BACK BY AND VERIFIED WITH: OVERBY,M RN 05/25/2018 AT 0004 SKEEN,P Performed at [facility], [address] [place], [place] [address]     RADIOLOGY STUDIES/RESULTS: Koreas Renal  Result Date: 05/23/2018 CLINICAL DATA:  Followup severely dilated urinary bladder, severe bilateral hydroureteronephrosis and multiple left renal calculi seen on a abdomen and pelvis CT dated 04/07/2018. EXAM: RENAL / URINARY TRACT ULTRASOUND COMPLETE COMPARISON:  Abdomen and pelvis CT dated 04/07/2018 and abdomen radiographs dated 04/10/2018. FINDINGS: Right Kidney: Renal measurements: 11.6 x 6.7 x 5.9 cm = volume: 238 mL . Echogenicity within normal limits. No mass or hydronephrosis visualized. Left Kidney: Renal measurements: 12.5 x 6.4 x 6.3 cm = volume: 264 mL. Normal echotexture. No hydronephrosis. Multiple calculi. Bladder: Appears normal for degree of bladder distention. IMPRESSION: 1. Resolved bladder dilatation and bilateral hydroureteronephrosis. 2. Multiple nonobstructing left renal calculi. Electronically Signed   By: Beckie SaltsSteven  Reid M.D.   On: 05/23/2018 15:45   Ir Perc Cholecystostomy  Result  Date: 05/25/2018 INDICATION: 68 year old male with a history gangrenous cholecystitis EXAM: CHOLECYSTOSTOMY MEDICATIONS: None ANESTHESIA/SEDATION: Moderate (conscious) sedation was employed during this procedure. A total of Versed 1.0 mg and Fentanyl 25 mcg was administered intravenously. Moderate Sedation Time: 510 minutes. The patient's level of consciousness and vital signs were monitored continuously by radiology nursing throughout the procedure under my direct supervision. FLUOROSCOPY TIME:  Fluoroscopy Time: 0 minutes 30 seconds (2.5 mGy). COMPLICATIONS: None PROCEDURE: Informed written consent was obtained from the patient and the patient's family after a thorough discussion of the procedural risks, benefits and alternatives. All questions were addressed. Maximal Sterile Barrier Technique was utilized including caps, mask, sterile gowns, sterile gloves, sterile drape, hand hygiene and skin antiseptic. A timeout was performed prior to the initiation of the procedure. Ultrasound survey of the right upper quadrant was performed for planning purposes. Once the patient is prepped and draped in the usual sterile fashion, the skin and subcutaneous tissues overlying the gallbladder were generously infiltrated 1% lidocaine for local anesthesia. A coaxial needle was advanced under ultrasound guidance through the skin subcutaneous tissues and a small segment of liver into the gallbladder lumen. With removal of the stylet, spontaneous dark bile drainage occurred. Using modified Seldinger technique, a 10 French drain was placed into the gallbladder fossa, with aspiration of the sample for the lab. Contrast injection confirmed position of the tube within the gallbladder lumen. Drainage catheter was attached to gravity drain with a suture retention placed. Patient tolerated the procedure well and remained hemodynamically stable throughout. No complications were encountered and no significant blood loss encountered.  IMPRESSION: Status post image guided percutaneous cholecystostomy tube. Signed, Yvone NeuJaime S. Reyne DumasWagner, DO, RPVI [facility] Electronically Signed   By: Gilmer MorJaime  Wagner D.O.   On: 05/25/2018 15:16   Dg Abd Portable 1v  Result Date: 05/27/2018 CLINICAL DATA:  Abdominal distension.  Cholecystostomy. EXAM: PORTABLE ABDOMEN - 1 VIEW COMPARISON:  CT 05/23/2018 FINDINGS: Percutaneous drainage catheter in the RIGHT upper quadrant consistent with cholecystostomy. Multiple gas-filled loops of large and small bowel. Stool in the rectum. Findings similar to CT of 05/23/2018. Multiple LEFT renal calculi noted. IMPRESSION: Gas distention of small bowel and colon similar to CT 05/23/2018. No evidence of high-grade obstruction. Electronically Signed   By: Genevive BiStewart  Edmunds M.D.   On: 05/27/2018 11:22     LOS: 3 days   Jeoffrey MassedShanker Lennyn Bellanca, MD  [facility]  If 7PM-7AM, please contact night-coverage  Please page via [email or web address]  Go to [email or web address] and use [facility] universal password to access. If you do not have the password, please contact the hospital operator.  Locate the [facility] provider you are looking for under [facility] and page to a number that you can be directly reached. If you still have difficulty reaching the provider, please page the [facility] (Director on Call) for the Hospitalists listed on amion for assistance.  05/27/2018, 1:59 PM

## 2018-05-27 NOTE — Progress Notes (Signed)
Physical Therapy Treatment Patient Details Name: [name] MRN: [id number] DOB: 1951/02/21 Today's Date: 05/27/2018    History of Present Illness 67yo male presenting to the ED at [facility] with abdominal pain, foul urine, and fever. Found to have severe sepsis, UTI, SBO, and gangrenous cholecystitis, transferred to [facility]. PMH mental disability (non-verbal at baseline), DM, HTN    PT Comments    Patient agreeable to participating in therapy today. Aunt in room throughout session. Patient required min guard to min assist for supine to sit and sit to stand transfers. Ambulating with IV pole using both UEs was clumsy for patient but unable to get patient to attempt to ambulation while holding therapists hand. Patient was antalgic over Rt LE while limiting Rt hip and knee movement. Patient may benefit from trial of ambulating with one hand held or without assistive device at all. PT will continue to follow. Patient left in room sitting in recliner on room air with aunt present. O2 sats 96-100%. Nursing notified.   Follow Up Recommendations  Home health PT;Supervision/Assistance - 24 hour     Equipment Recommendations  Other (comment)(TBD )    Recommendations for Other Services       Precautions / Restrictions Precautions Precautions: Fall;Other (comment) Precaution Comments: non-verbal, can follow simple commands, abdominal drain  Restrictions Weight Bearing Restrictions: No    Mobility  Bed Mobility Overal bed mobility: Needs Assistance Bed Mobility: Supine to Sit     Supine to sit: Min assist;Min guard     General bed mobility comments: min A to power up  Transfers Overall transfer level: Needs assistance Equipment used: Ambulation equipment used Transfers: Stand Pivot Transfers;Sit to/from Stand Sit to Stand: Min assist;Min guard Stand pivot transfers: Min assist;Min guard       General transfer comment: min assist for IV pole, might transfer/ambulate better  with 1 hand held, antalgic over right LE  Ambulation/Gait Ambulation/Gait assistance: Min guard;Min assist Gait Distance (Feet): 40 Feet Assistive device: IV Pole Gait Pattern/deviations: Step-through pattern;Trunk flexed;Antalgic;Decreased step length - right;Decreased step length - left;Decreased stride length Gait velocity: decreased    General Gait Details: min assist for IV pole, might transfer/ambulate better with 1 hand held, antalgic over right LE   Stairs             Wheelchair Mobility    Modified Rankin (Stroke Patients Only)       Balance Overall balance assessment: Needs assistance Sitting-balance support: Feet supported;No upper extremity supported Sitting balance-[name]: Good     Standing balance support: No upper extremity supported;During functional activity Standing balance-[name]: Fair                              Cognition Arousal/Alertness: Awake/alert Behavior During Therapy: Flat affect;WFL for tasks assessed/performed Overall Cognitive Status: History of cognitive impairments - at baseline                                 General Comments: mental disability and non-verbal at baseline- able to follow simple commands and shake head "yes" or "no" for very simple questions       Exercises General Exercises - Upper Extremity Shoulder Flexion: AROM;Strengthening;Both;5 reps General Exercises - Lower Extremity Ankle Circles/Pumps: AROM;Strengthening;Both;5 reps;Seated Long Arc Quad: AROM;Strengthening;Both;5 reps;Seated Hip Flexion/Marching: AROM;Strengthening;Both;5 reps    General Comments        Pertinent Vitals/Pain  Pain Assessment: No/denies pain    Home Living                      Prior Function            PT Goals (current goals can now be found in the care plan section) Acute Rehab PT Goals Patient Stated Goal: go home  PT Goal Formulation: With patient/family Time For Goal  Achievement: 06/09/18 Potential to Achieve Goals: Good Progress towards PT goals: Progressing toward goals    Frequency    Min 3X/week      PT Plan Current plan remains appropriate    Co-evaluation              AM-PAC PT "6 Clicks" Mobility   Outcome Measure  Help needed turning from your back to your side while in a flat bed without using bedrails?: A Little Help needed moving from lying on your back to sitting on the side of a flat bed without using bedrails?: A Little Help needed moving to and from a bed to a chair (including a wheelchair)?: A Little Help needed standing up from a chair using your arms (e.g., wheelchair or bedside chair)?: A Little Help needed to walk in hospital room?: A Little Help needed climbing 3-5 steps with a railing? : A Lot 6 Click Score: 17    End of Session Equipment Utilized During Treatment: Gait belt Activity Tolerance: Patient tolerated treatment well Patient left: in chair;with call bell/phone within reach;with family/visitor present(nursing notified) Nurse Communication: Other (comment);Mobility status(SpO2 100% on room air with mobility ) PT Visit Diagnosis: Muscle weakness (generalized) (M62.81);Pain;Difficulty in walking, not elsewhere classified (R26.2)     Time: 2130-8657 PT Time Calculation (min) (ACUTE ONLY): 24 min  Charges:  $Gait Training: 8-22 mins $Therapeutic Exercise: 8-22 mins                     Katina Dung. Hartnett-Rands, MS, PT [facility] #[id number] 05/27/2018, 12:10 PM

## 2018-05-27 NOTE — Progress Notes (Signed)
Pharmacy Antibiotic Note  [name] is a 68 y.o. male admitted on 05/24/2018 with intra-abdominal infection.  Pharmacy was consulted 28/20 for Zosyn dosing.  S/p percutaneous cholecystostomy drainage 05/24/18. MD inotes sepsis pathophysiology has resolved, no RUQ/abdominal pain.  WBC decreased >9.4>10.4, afebrile. Scr wnl stable  Plan: Continue Zosyn 3.375g IV q8h (4-hour infusion).  MD plans to transition to oral Augmentin.  Height: 5\' 6"  (167.6 cm) Weight: 156 lb 8.4 oz (71 kg) IBW/kg (Calculated) : 63.8  Temp (24hrs), Avg:97.9 F (36.6 C), Min:97.8 F (36.6 C), Max:97.9 F (36.6 C)  No Known Allergies   Microbiology:  2/8 bcx >> ngtd 2/8 ucx >> negative 2/8 MRSA PCR - positive 2/8 bile from abscess :  Klebsiella:  pan sens, R to ampicillin   Thank you for allowing pharmacy to be a part of this patient's care.  Noah Delaine, RPh Clinical Pharmacist [phone] Please check AMION for all [facility] Pharmacy phone numbers After 10:00 PM, call Main Pharmacy [phone] 05/27/2018 3:57 PM

## 2018-05-28 ENCOUNTER — Encounter (HOSPITAL_COMMUNITY): Payer: Self-pay

## 2018-05-28 DIAGNOSIS — I10 Essential (primary) hypertension: Secondary | ICD-10-CM | POA: Diagnosis not present

## 2018-05-28 DIAGNOSIS — K56609 Unspecified intestinal obstruction, unspecified as to partial versus complete obstruction: Secondary | ICD-10-CM | POA: Diagnosis not present

## 2018-05-28 DIAGNOSIS — E118 Type 2 diabetes mellitus with unspecified complications: Secondary | ICD-10-CM | POA: Diagnosis not present

## 2018-05-28 DIAGNOSIS — K810 Acute cholecystitis: Secondary | ICD-10-CM | POA: Diagnosis not present

## 2018-05-28 LAB — GLUCOSE, CAPILLARY
GLUCOSE-CAPILLARY: 162 mg/dL — AB (ref 70–99)
Glucose-Capillary: 130 mg/dL — ABNORMAL HIGH (ref 70–99)
Glucose-Capillary: 162 mg/dL — ABNORMAL HIGH (ref 70–99)

## 2018-05-28 MED ORDER — GLUCERNA SHAKE PO LIQD: 237.0000 mL | Freq: Three times a day (TID) | ORAL | 0 refills | Status: DC

## 2018-05-28 MED ORDER — AMOXICILLIN-POT CLAVULANATE 875-125 MG PO TABS: 1.0000 | ORAL_TABLET | Freq: Two times a day (BID) | ORAL | 0 refills | Status: DC

## 2018-05-28 MED FILL — AMOX-CLAV 875-125 MG TABLET: 875-125 | 10 days supply | Qty: 20 | Fill #0

## 2018-05-28 NOTE — Discharge Summary (Signed)
PATIENT DETAILS Name: [name] Age: 68 y.o. Sex: male Date of Birth: 1950/08/23 MRN: [id number]. Admitting Physician: Hillary Bow, DO PCP:Kim, Fayrene Fearing, MD  Admit Date: 05/24/2018 Discharge date: 05/28/2018  Recommendations for Outpatient Follow-up:  1. Follow up with PCP in 1-2 weeks 2. Please obtain BMP/CBC in one week 3. Please ensure follow-up with IR, general surgery  Admitted From:  Home   Disposition: Home with home health services   Home Health: Yes  Equipment/Devices: None  Discharge Condition: Stable  CODE STATUS: FULL CODE  Diet recommendation:  Heart Healthy / Carb Modified   Brief Summary: See H&P, Labs, Consult and Test reports for all details in brief, Patient is a 68 y.o. male with history of intellectual disability, diabetes, hypertension (no longer on antihypertensives), BPH with urinary retention requiring in/out catheterization 3 times daily at home-presenting with abdominal pain and fever, CT abdomen suggestive of acute gangrenous cholecystitis, along with high-grade SBO and emphysematous cystitis.  Brief Hospital Course: Secondary to acute gangrenous cholecystitis s/p percutaneous cholecystostomy drain placement on 2/8: Sepsis pathophysiology has resolved, no RUQ/abdominal pain.  Bile cultures positive for Klebsiella pneumoniae.  Tolerating advancement in diet-head slight distention in his abdomen but is tolerating diet and having bowel movements.  Spoke with general surgery-okay to discharge-they will arrange for outpatient follow-up.  Was on Zosyn during his hospital stay-has been transitioned to Augmentin.    Small bowel obstruction: Seen on CT abdomen-however patient without any clinical features suggestive of small bowel obstruction.  Tolerating advancement in diet-having BMs.  No vomiting.  See above regarding mild abdominal distention.   Emphysematous cystitis: Gas in the urinary bladder could be from in/out catheterization that is  done 3 times daily by family.  Urine cultures negative.  Transitioning to Augmentin from Zosyn  Acute urinary retention: Patient has intermittent urinary retention at home-family catheterizes him 3 times daily, was found to have more than 900 cc of urine on admission-hence Foley catheter placed.  Since clinically improved-Foley catheter discontinued-resume I/O catheterization.    Chronic normocytic anemia: Hemoglobin stable-suspect has chronic anemia at baseline-hemoglobin slightly worse than usual due to sepsis/acute illness.  No evidence of overt blood loss.  Follow CBC periodically.   DM-2: Continue to hold metformin-CBG stable with SSI.  History of hypertension: Per family-no longer on any antihypertensives.  History of BPH: Continue Flomax.  See above regarding intermittent catheterization done at home.  Mental retardation/intellectual disability  Procedures/Studies: None  Discharge Diagnoses:  Principal Problem:   Acute gangrenous cholecystitis Active Problems:   Intellectual disability   HTN (hypertension)   Diabetes mellitus type 2, controlled (HCC)   Severe sepsis with septic shock (HCC)   UTI (urinary tract infection)   Nonverbal   SBO (small bowel obstruction) (HCC)   Discharge Instructions:  Activity:  As tolerated with Full fall precautions use walker/cane & assistance as needed   Discharge Instructions    Call MD for:  redness, tenderness, or signs of infection (pain, swelling, redness, odor or green/yellow discharge around incision site)   Complete by:  As directed    Call MD for:  severe uncontrolled pain   Complete by:  As directed    Diet - low sodium heart healthy   Complete by:  As directed    Diet Carb Modified   Complete by:  As directed    Discharge instructions   Complete by:  As directed    Follow with Primary MD  Pearson Grippe, MD IN 1 WEEK  Follow-up  with the interventional radiology drain clinic as instructed  Follow-up with general  surgery as instructed  Please get a complete blood count and chemistry panel checked by your Primary MD at your next visit, and again as instructed by your Primary MD.  Get Medicines reviewed and adjusted: Please take all your medications with you for your next visit with your Primary MD  Laboratory/radiological data: Please request your Primary MD to go over all hospital tests and procedure/radiological results at the follow up, please ask your Primary MD to get all Hospital records sent to his/her office.  In some cases, they will be blood work, cultures and biopsy results pending at the time of your discharge. Please request that your primary care M.D. follows up on these results.  Also Note the following: If you experience worsening of your admission symptoms, develop shortness of breath, life threatening emergency, suicidal or homicidal thoughts you must seek medical attention immediately by calling 911 or calling your MD immediately  if symptoms less severe.  You must read complete instructions/literature along with all the possible adverse reactions/side effects for all the Medicines you take and that have been prescribed to you. Take any new Medicines after you have completely understood and accpet all the possible adverse reactions/side effects.   Do not drive when taking Pain medications or sleeping medications (Benzodaizepines)  Do not take more than prescribed Pain, Sleep and Anxiety Medications. It is not advisable to combine anxiety,sleep and pain medications without talking with your primary care practitioner  Special Instructions: If you have smoked or chewed Tobacco  in the last 2 yrs please stop smoking, stop any regular Alcohol  and or any Recreational drug use.  Wear Seat belts while driving.  Please note: You were cared for by a hospitalist during your hospital stay. Once you are discharged, your primary care physician will handle any further medical issues. Please note  that NO REFILLS for any discharge medications will be authorized once you are discharged, as it is imperative that you return to your primary care physician (or establish a relationship with a primary care physician if you do not have one) for your post hospital discharge needs so that they can reassess your need for medications and monitor your lab values.   Increase activity slowly   Complete by:  As directed      Allergies as of 05/28/2018   No Known Allergies     Medication List    STOP taking these medications   saccharomyces boulardii 250 MG capsule Commonly known as:  FLORASTOR   vancomycin 50 mg/mL  oral solution Commonly known as:  VANCOCIN     TAKE these medications   amoxicillin-clavulanate 875-125 MG tablet Commonly known as:  AUGMENTIN Take 1 tablet by mouth every 12 (twelve) hours.   aspirin EC 81 MG tablet Take 81 mg by mouth every morning.   feeding supplement (GLUCERNA SHAKE) Liqd Take 237 mLs by mouth 3 (three) times daily between meals.   metFORMIN 500 MG tablet Commonly known as:  GLUCOPHAGE Take 1,000 mg by mouth every morning.   tamsulosin 0.4 MG Caps capsule Commonly known as:  FLOMAX Take 1 capsule (0.4 mg total) by mouth every evening.   TYLENOL ARTHRITIS PAIN PO Take 1 tablet by mouth every 6 (six) hours as needed (pain).            Durable Medical Equipment  (From admission, onward)         Start  Ordered   05/27/18 1003  For home use only DME 3 n 1  Once     05/27/18 1002   05/27/18 1003  For home use only DME Walker rolling  Once    Question:  Patient needs a walker to treat with the following condition  Answer:  Weakness   05/27/18 1002         Follow-up Information    [facility] - Dme Follow up.   Why:  rolling walker and bedside commode will be delivered to bedside  Contact information: [address] [place] [id number] [phone]        [facility], [facility] .   Specialty:   [facility] Contact information: [address] [place] [id number] [phone]        Kinsinger, De BlanchLuke Aaron, MD Follow up on 07/03/2018.   Specialty:  General Surgery Why:  3:00pm, arrive by 2:30pm for paperwork and check in Contact information: [address] [place] [address] [phone]        Gilmer MorWagner, Jaime, DO Follow up in 4 week(s).   Specialties:  Interventional Radiology, Radiology Contact information: [address] [place] [address] [phone]        [name], [name], MD. Schedule an appointment as soon as possible for a visit in 1 week(s).   Specialty:  Internal Medicine Contact information: [address] [place] [address] [phone]          No Known Allergies  Consultations:   general surgery   Other Procedures/Studies: Koreas Renal  Result Date: 05/23/2018 CLINICAL DATA:  Followup severely dilated urinary bladder, severe bilateral hydroureteronephrosis and multiple left renal calculi seen on a abdomen and pelvis CT dated 04/07/2018. EXAM: RENAL / URINARY TRACT ULTRASOUND COMPLETE COMPARISON:  Abdomen and pelvis CT dated 04/07/2018 and abdomen radiographs dated 04/10/2018. FINDINGS: Right Kidney: Renal measurements: 11.6 x 6.7 x 5.9 cm = volume: 238 mL . Echogenicity within normal limits. No mass or hydronephrosis visualized. Left Kidney: Renal measurements: 12.5 x 6.4 x 6.3 cm = volume: 264 mL. Normal echotexture. No hydronephrosis. Multiple calculi. Bladder: Appears normal for degree of bladder distention. IMPRESSION: 1. Resolved bladder dilatation and bilateral hydroureteronephrosis. 2. Multiple nonobstructing left renal calculi. Electronically Signed   By: Beckie SaltsSteven  Reid M.D.   On: 05/23/2018 15:45   Ir Perc Cholecystostomy  Result Date: 05/25/2018 INDICATION: 68 year old male with a history gangrenous cholecystitis EXAM: CHOLECYSTOSTOMY MEDICATIONS: None ANESTHESIA/SEDATION: Moderate  (conscious) sedation was employed during this procedure. A total of Versed 1.0 mg and Fentanyl 25 mcg was administered intravenously. Moderate Sedation Time: 510 minutes. The patient's level of consciousness and vital signs were monitored continuously by radiology nursing throughout the procedure under my direct supervision. FLUOROSCOPY TIME:  Fluoroscopy Time: 0 minutes 30 seconds (2.5 mGy). COMPLICATIONS: None PROCEDURE: Informed written consent was obtained from the patient and the patient's family after a thorough discussion of the procedural risks, benefits and alternatives. All questions were addressed. Maximal Sterile Barrier Technique was utilized including caps, mask, sterile gowns, sterile gloves, sterile drape, hand hygiene and skin antiseptic. A timeout was performed prior to the initiation of the procedure. Ultrasound survey of the right upper quadrant was performed for planning purposes. Once the patient is prepped and draped in the usual sterile fashion, the skin and subcutaneous tissues overlying the gallbladder were generously infiltrated 1% lidocaine for local anesthesia. A coaxial needle was advanced under ultrasound guidance through  the skin subcutaneous tissues and a small segment of liver into the gallbladder lumen. With removal of the stylet, spontaneous dark bile drainage occurred. Using modified Seldinger technique, a 10 French drain was placed into the gallbladder fossa, with aspiration of the sample for the lab. Contrast injection confirmed position of the tube within the gallbladder lumen. Drainage catheter was attached to gravity drain with a suture retention placed. Patient tolerated the procedure well and remained hemodynamically stable throughout. No complications were encountered and no significant blood loss encountered. IMPRESSION: Status post image guided percutaneous cholecystostomy tube. Signed, Yvone Neu. Reyne Dumas, RPVI [facility]  [facility] Radiology Electronically Signed   By: Gilmer Mor D.O.   On: 05/25/2018 15:16   Dg Abd Portable 1v  Result Date: 05/27/2018 CLINICAL DATA:  Abdominal distension.  Cholecystostomy. EXAM: PORTABLE ABDOMEN - 1 VIEW COMPARISON:  CT 05/23/2018 FINDINGS: Percutaneous drainage catheter in the RIGHT upper quadrant consistent with cholecystostomy. Multiple gas-filled loops of large and small bowel. Stool in the rectum. Findings similar to CT of 05/23/2018. Multiple LEFT renal calculi noted. IMPRESSION: Gas distention of small bowel and colon similar to CT 05/23/2018. No evidence of high-grade obstruction. Electronically Signed   By: Genevive Bi M.D.   On: 05/27/2018 11:22      TODAY-DAY OF DISCHARGE:  Subjective:   Sena Slate today has no headache,no chest abdominal pain,no new weakness tingling or numbness  Objective:   Blood pressure (!) 107/58, pulse 95, temperature 97.8 F (36.6 C), temperature source Oral, resp. rate 16, height 5\' 6"  (1.676 m), weight 71 kg, SpO2 100 %.  Intake/Output Summary (Last 24 hours) at 05/28/2018 0924 Last data filed at 05/28/2018 0500 Gross per 24 hour  Intake 1997.29 ml  Output 2760 ml  Net -762.71 ml   Filed Weights   05/24/18 0240  Weight: 71 kg    Exam: Awake Alert, Oriented *3, No new F.N deficits, Normal affect [place].AT,PERRAL Supple Neck,No JVD, No cervical lymphadenopathy appriciated.  Symmetrical Chest wall movement, Good air movement bilaterally, CTAB RRR,No Gallops,Rubs or new Murmurs, No Parasternal Heave +ve B.Sounds, Abd Soft, Non tender, No organomegaly appriciated, No rebound -guarding or rigidity. No Cyanosis, Clubbing or edema, No new Rash or bruise   PERTINENT RADIOLOGIC STUDIES: US Renal  Result Date: 05/23/2018 CLINICAL DATA:  Followup severely dilated urinary bladder, severe bilateral hydroureteronephrosis and multiple left renal calculi seen on a abdomen and pelvis CT dated 04/07/2018. EXAM: RENAL / URINARY  TRACT ULTRASOUND COMPLETE COMPARISON:  Abdomen and pelvis CT dated 04/07/2018 and abdomen radiographs dated 04/10/2018. FINDINGS: Right Kidney: Renal measurements: 11.6 x 6.7 x 5.9 cm = volume: 238 mL . Echogenicity within normal limits. No mass or hydronephrosis visualized. Left Kidney: Renal measurements: 12.5 x 6.4 x 6.3 cm = volume: 264 mL. Normal echotexture. No hydronephrosis. Multiple calculi. Bladder: Appears normal for degree of bladder distention. IMPRESSION: 1. Resolved bladder dilatation and bilateral hydroureteronephrosis. 2. Multiple nonobstructing left renal calculi. Electronically Signed   By: Beckie Salts M.D.   On: 05/23/2018 15:45   Ir Perc Cholecystostomy  Result Date: 05/25/2018 INDICATION: 68 year old male with a history gangrenous cholecystitis EXAM: CHOLECYSTOSTOMY MEDICATIONS: None ANESTHESIA/SEDATION: Moderate (conscious) sedation was employed during this procedure. A total of Versed 1.0 mg and Fentanyl 25 mcg was administered intravenously. Moderate Sedation Time: 510 minutes. The patient's level of consciousness and vital signs were monitored continuously by radiology nursing throughout the procedure under my direct supervision. FLUOROSCOPY TIME:  Fluoroscopy Time: 0 minutes 30 seconds (2.5  mGy). COMPLICATIONS: None PROCEDURE: Informed written consent was obtained from the patient and the patient's family after a thorough discussion of the procedural risks, benefits and alternatives. All questions were addressed. Maximal Sterile Barrier Technique was utilized including caps, mask, sterile gowns, sterile gloves, sterile drape, hand hygiene and skin antiseptic. A timeout was performed prior to the initiation of the procedure. Ultrasound survey of the right upper quadrant was performed for planning purposes. Once the patient is prepped and draped in the usual sterile fashion, the skin and subcutaneous tissues overlying the gallbladder were generously infiltrated 1% lidocaine for local  anesthesia. A coaxial needle was advanced under ultrasound guidance through the skin subcutaneous tissues and a small segment of liver into the gallbladder lumen. With removal of the stylet, spontaneous dark bile drainage occurred. Using modified Seldinger technique, a 10 French drain was placed into the gallbladder fossa, with aspiration of the sample for the lab. Contrast injection confirmed position of the tube within the gallbladder lumen. Drainage catheter was attached to gravity drain with a suture retention placed. Patient tolerated the procedure well and remained hemodynamically stable throughout. No complications were encountered and no significant blood loss encountered. IMPRESSION: Status post image guided percutaneous cholecystostomy tube. Signed, Yvone NeuJaime S. Reyne DumasWagner, DO, RPVI [facility] [facility] Electronically Signed   By: Gilmer MorJaime  Wagner D.O.   On: 05/25/2018 15:16   Dg Abd Portable 1v  Result Date: 05/27/2018 CLINICAL DATA:  Abdominal distension.  Cholecystostomy. EXAM: PORTABLE ABDOMEN - 1 VIEW COMPARISON:  CT 05/23/2018 FINDINGS: Percutaneous drainage catheter in the RIGHT upper quadrant consistent with cholecystostomy. Multiple gas-filled loops of large and small bowel. Stool in the rectum. Findings similar to CT of 05/23/2018. Multiple LEFT renal calculi noted. IMPRESSION: Gas distention of small bowel and colon similar to CT 05/23/2018. No evidence of high-grade obstruction. Electronically Signed   By: Genevive BiStewart  Edmunds M.D.   On: 05/27/2018 11:22     PERTINENT LAB RESULTS: CBC: Recent Labs    05/26/18 0405 05/27/18 0408  WBC 9.4 10.4  HGB 8.7* 9.1*  HCT 28.4* 28.9*  PLT 206 223   CMET CMP     Component Value Date/Time   NA 141 05/27/2018 0408   K 3.9 05/27/2018 0408   CL 112 (H) 05/27/2018 0408   CO2 24 05/27/2018 0408   GLUCOSE 137 (H) 05/27/2018 0408   BUN <5 (L) 05/27/2018 0408   CREATININE 0.85 05/27/2018 0408    CALCIUM 8.2 (L) 05/27/2018 0408   PROT 6.1 (L) 05/26/2018 0405   ALBUMIN 1.7 (L) 05/26/2018 0405   AST 24 05/26/2018 0405   ALT 50 (H) 05/26/2018 0405   ALKPHOS 143 (H) 05/26/2018 0405   BILITOT 1.0 05/26/2018 0405   GFRNONAA >60 05/27/2018 0408   GFRAA >60 05/27/2018 0408    GFR Estimated Creatinine Clearance: 75.1 mL/min (by C-G formula based on SCr of 0.85 mg/dL). No results for input(s): LIPASE, AMYLASE in the last 72 hours. No results for input(s): CKTOTAL, CKMB, CKMBINDEX, TROPONINI in the last 72 hours. Invalid input(s): POCBNP No results for input(s): DDIMER in the last 72 hours. No results for input(s): HGBA1C in the last 72 hours. No results for input(s): CHOL, HDL, LDLCALC, TRIG, CHOLHDL, LDLDIRECT in the last 72 hours. No results for input(s): TSH, T4TOTAL, T3FREE, THYROIDAB in the last 72 hours.  Invalid input(s): FREET3 No results for input(s): VITAMINB12, FOLATE, FERRITIN, TIBC, IRON, RETICCTPCT in the last 72 hours. Coags: No results for input(s): INR in the last 72  hours.  Invalid input(s): PT Microbiology: Recent Results (from the past 240 hour(s))  Culture, blood (routine x 2)     Status: None (Preliminary result)   Collection Time: 05/24/18  4:21 AM  Result Value Ref Range Status   Specimen Description BLOOD LEFT ANTECUBITAL  Final   Special Requests   Final    BOTTLES DRAWN AEROBIC AND ANAEROBIC Blood Culture results may not be optimal due to an excessive volume of blood received in culture bottles   Culture   Final    NO GROWTH 4 DAYS Performed at [facility], [address], [place], [place] [address]    Report Status PENDING  Incomplete  Culture, blood (routine x 2)     Status: None (Preliminary result)   Collection Time: 05/24/18  4:41 AM  Result Value Ref Range Status   Specimen Description BLOOD RIGHT HAND  Final   Special Requests   Final    BOTTLES DRAWN AEROBIC AND ANAEROBIC Blood Culture results may not be optimal due to an excessive  volume of blood received in culture bottles   Culture   Final    NO GROWTH 4 DAYS Performed at [facility], [address], [place], [place] [address]    Report Status PENDING  Incomplete  Aerobic/Anaerobic Culture (surgical/deep wound)     Status: None   Collection Time: 05/24/18  2:30 PM  Result Value Ref Range Status   Specimen Description ABSCESS GALL BLADDER PERC CHOLE  Final   Special Requests   Final    NONE Performed at [facility], [address] [place], [place] [address]    Gram Stain   Final    FEW WBC PRESENT, PREDOMINANTLY PMN ABUNDANT GRAM NEGATIVE RODS FEW GRAM VARIABLE ROD    Culture   Final    ABUNDANT KLEBSIELLA PNEUMONIAE FEW CLOSTRIDIUM PERFRINGENS    Report Status 05/27/2018 FINAL  Final   Organism ID, Bacteria KLEBSIELLA PNEUMONIAE  Final      Susceptibility   Klebsiella pneumoniae - MIC*    AMPICILLIN >=32 RESISTANT Resistant     CEFAZOLIN <=4 SENSITIVE Sensitive     CEFEPIME <=1 SENSITIVE Sensitive     CEFTAZIDIME <=1 SENSITIVE Sensitive     CEFTRIAXONE <=1 SENSITIVE Sensitive     CIPROFLOXACIN <=0.25 SENSITIVE Sensitive     GENTAMICIN <=1 SENSITIVE Sensitive     IMIPENEM <=0.25 SENSITIVE Sensitive     TRIMETH/SULFA <=20 SENSITIVE Sensitive     AMPICILLIN/SULBACTAM 8 SENSITIVE Sensitive     PIP/TAZO <=4 SENSITIVE Sensitive     Extended ESBL NEGATIVE Sensitive     * ABUNDANT KLEBSIELLA PNEUMONIAE  Culture, Urine     Status: None   Collection Time: 05/24/18  5:13 PM  Result Value Ref Range Status   Specimen Description URINE, RANDOM  Final   Special Requests NONE  Final   Culture   Final    NO GROWTH Performed at [facility], [address] [place], [place] [address]    Report Status 05/26/2018 FINAL  Final  MRSA PCR Screening     Status: Abnormal   Collection Time: 05/24/18 10:26 PM  Result Value Ref Range Status   MRSA by PCR POSITIVE (A) NEGATIVE Final    Comment:        The GeneXpert MRSA Assay (FDA approved for  NASAL specimens only), is one component of a comprehensive MRSA colonization surveillance program. It is not intended to diagnose MRSA infection nor to  guide or monitor treatment for MRSA infections. RESULT CALLED TO, READ BACK BY AND VERIFIED WITH: OVERBY,M RN 05/25/2018 AT 0004 SKEEN,P Performed at [facility], [address], [place], [place] [address]     FURTHER DISCHARGE INSTRUCTIONS:  Get Medicines reviewed and adjusted: Please take all your medications with you for your next visit with your Primary MD  Laboratory/radiological data: Please request your Primary MD to go over all hospital tests and procedure/radiological results at the follow up, please ask your Primary MD to get all Hospital records sent to his/her office.  In some cases, they will be blood work, cultures and biopsy results pending at the time of your discharge. Please request that your primary care M.D. goes through all the records of your hospital data and follows up on these results.  Also Note the following: If you experience worsening of your admission symptoms, develop shortness of breath, life threatening emergency, suicidal or homicidal thoughts you must seek medical attention immediately by calling 911 or calling your MD immediately  if symptoms less severe.  You must read complete instructions/literature along with all the possible adverse reactions/side effects for all the Medicines you take and that have been prescribed to you. Take any new Medicines after you have completely understood and accpet all the possible adverse reactions/side effects.   Do not drive when taking Pain medications or sleeping medications (Benzodaizepines)  Do not take more than prescribed Pain, Sleep and Anxiety Medications. It is not advisable to combine anxiety,sleep and pain medications without talking with your primary care practitioner  Special Instructions: If you have smoked or chewed Tobacco  in the last 2 yrs  please stop smoking, stop any regular Alcohol  and or any Recreational drug use.  Wear Seat belts while driving.  Please note: You were cared for by a hospitalist during your hospital stay. Once you are discharged, your primary care physician will handle any further medical issues. Please note that NO REFILLS for any discharge medications will be authorized once you are discharged, as it is imperative that you return to your primary care physician (or establish a relationship with a primary care physician if you do not have one) for your post hospital discharge needs so that they can reassess your need for medications and monitor your lab values.  Total Time spent coordinating discharge including counseling, education and face to face time equals 45 minutes.  SignedJeoffrey Massed 05/28/2018 9:24 AM

## 2018-05-28 NOTE — Progress Notes (Signed)
Referring Physician(s): Dr. Ronette Deter  Supervising Physician: Malachy Moan  Patient Status:  [facility] - In-pt  Chief Complaint: Follow up cholecystostomy  Subjective:  Patient laying in bed comfortably. Care giver at bedside who states patient has been doing well, no pain or complaints noted by her. She reports good appetite. Looking forward to being discharged today.   Allergies: Patient has no known allergies.  Medications: Prior to Admission medications   Medication Sig Start Date End Date Taking? Authorizing Provider  Acetaminophen (TYLENOL ARTHRITIS PAIN PO) Take 1 tablet by mouth every 6 (six) hours as needed (pain).   Yes [provider]  aspirin EC 81 MG tablet Take 81 mg by mouth every morning.    Yes [provider]  metFORMIN (GLUCOPHAGE) 500 MG tablet Take 1,000 mg by mouth every morning. 02/03/18  Yes [provider]  tamsulosin (FLOMAX) 0.4 MG CAPS capsule Take 1 capsule (0.4 mg total) by mouth every evening. 04/12/18  Yes Erick Blinks, MD  amoxicillin-clavulanate (AUGMENTIN) 875-125 MG tablet Take 1 tablet by mouth every 12 (twelve) hours. 05/28/18   Ghimire, Werner Lean, MD  feeding supplement, GLUCERNA SHAKE, (GLUCERNA SHAKE) LIQD Take 237 mLs by mouth 3 (three) times daily between meals. 05/28/18   Ghimire, Werner Lean, MD  saccharomyces boulardii (FLORASTOR) 250 MG capsule Take 1 capsule (250 mg total) by mouth 2 (two) times daily. Patient not taking: Reported on 05/24/2018 04/12/18   Erick Blinks, MD  vancomycin (VANCOCIN) 50 mg/mL oral solution Take 2.5 mLs (125 mg total) by mouth 4 (four) times daily. Patient not taking: Reported on 05/24/2018 04/12/18   Erick Blinks, MD     Vital Signs: BP (!) 107/58 (BP Location: Left Arm)   Pulse 95   Temp 97.8 F (36.6 C) (Oral)   Resp 16   Ht 5\' 6"  (1.676 m)   Wt 156 lb 8.4 oz (71 kg)   SpO2 100%   BMI 25.26 kg/m   Physical Exam Vitals signs and nursing note reviewed.    Constitutional:      General: He is not in acute distress. HENT:     Head: Normocephalic.  Cardiovascular:     Rate and Rhythm: Normal rate.  Pulmonary:     Effort: Pulmonary effort is normal.  Abdominal:     General: There is distension.     Tenderness: There is no abdominal tenderness.     Comments: (+) cholecystostomy to gravity with scant reddish/brown OP; Insertion site clean, dry, dressed appropriately without erythema, edema, drainage or active bleeding.    Skin:    General: Skin is warm and dry.  Neurological:     Mental Status: He is alert. Mental status is at baseline.     Imaging: Ir Perc Cholecystostomy  Result Date: 05/25/2018 INDICATION: 68 year old male with a history gangrenous cholecystitis EXAM: CHOLECYSTOSTOMY MEDICATIONS: None ANESTHESIA/SEDATION: Moderate (conscious) sedation was employed during this procedure. A total of Versed 1.0 mg and Fentanyl 25 mcg was administered intravenously. Moderate Sedation Time: 510 minutes. The patient's level of consciousness and vital signs were monitored continuously by radiology nursing throughout the procedure under my direct supervision. FLUOROSCOPY TIME:  Fluoroscopy Time: 0 minutes 30 seconds (2.5 mGy). COMPLICATIONS: None PROCEDURE: Informed written consent was obtained from the patient and the patient's family after a thorough discussion of the procedural risks, benefits and alternatives. All questions were addressed. Maximal Sterile Barrier Technique was utilized including caps, mask, sterile gowns, sterile gloves, sterile drape, hand hygiene and skin antiseptic. A  timeout was performed prior to the initiation of the procedure. Ultrasound survey of the right upper quadrant was performed for planning purposes. Once the patient is prepped and draped in the usual sterile fashion, the skin and subcutaneous tissues overlying the gallbladder were generously infiltrated 1% lidocaine for local anesthesia. A coaxial needle was advanced  under ultrasound guidance through the skin subcutaneous tissues and a small segment of liver into the gallbladder lumen. With removal of the stylet, spontaneous dark bile drainage occurred. Using modified Seldinger technique, a 10 French drain was placed into the gallbladder fossa, with aspiration of the sample for the lab. Contrast injection confirmed position of the tube within the gallbladder lumen. Drainage catheter was attached to gravity drain with a suture retention placed. Patient tolerated the procedure well and remained hemodynamically stable throughout. No complications were encountered and no significant blood loss encountered. IMPRESSION: Status post image guided percutaneous cholecystostomy tube. Signed, Yvone NeuJaime S. Reyne DumasWagner, DO, RPVI [facility] Electronically Signed   By: Gilmer MorJaime  Wagner D.O.   On: 05/25/2018 15:16   Dg Abd Portable 1v  Result Date: 05/27/2018 CLINICAL DATA:  Abdominal distension.  Cholecystostomy. EXAM: PORTABLE ABDOMEN - 1 VIEW COMPARISON:  CT 05/23/2018 FINDINGS: Percutaneous drainage catheter in the RIGHT upper quadrant consistent with cholecystostomy. Multiple gas-filled loops of large and small bowel. Stool in the rectum. Findings similar to CT of 05/23/2018. Multiple LEFT renal calculi noted. IMPRESSION: Gas distention of small bowel and colon similar to CT 05/23/2018. No evidence of high-grade obstruction. Electronically Signed   By: Genevive BiStewart  Edmunds M.D.   On: 05/27/2018 11:22    Labs:  CBC: Recent Labs    05/24/18 0403 05/25/18 0617 05/26/18 0405 05/27/18 0408  WBC 20.2* 12.1* 9.4 10.4  HGB 8.7* 9.2* 8.7* 9.1*  HCT 27.9* 30.4* 28.4* 28.9*  PLT 172 195 206 223    COAGS: Recent Labs    05/24/18 0403  INR 1.38  APTT 37*    BMP: Recent Labs    05/24/18 0403 05/25/18 0617 05/26/18 0405 05/27/18 0408  NA 138 144 142 141  K 3.2* 3.3* 3.1* 3.9  CL 107 114* 115* 112*  CO2 22 20* 23 24    GLUCOSE 177* 104* 139* 137*  BUN 15 12 8  <5*  CALCIUM 7.9* 8.2* 7.9* 8.2*  CREATININE 1.17 0.98 0.92 0.85  GFRNONAA >60 >60 >60 >60  GFRAA >60 >60 >60 >60    LIVER FUNCTION TESTS: Recent Labs    02/26/18 1605 05/24/18 0403 05/25/18 0617 05/26/18 0405  BILITOT 0.6 2.7* 1.4* 1.0  AST 13* 70* 39 24  ALT 10 111* 71* 50*  ALKPHOS 55 201* 156* 143*  PROT 7.1 7.0 6.6 6.1*  ALBUMIN 3.5 2.0* 1.8* 1.7*    Assessment and Plan:  Patient s/p cholecystomy 05/24/18 with Dr. Loreta AveWagner for acute gangrenous cholecystitis. OP 10 cc in last 24 hours, previously about 40 cc. Abdomen distended however care giver reports this is not new and appears improved to her - she denies vomiting, reports patient ate all of his breakfast, having BMs. Abd XR yesterday showed gas distention of small bowel and colon, no obstruction noted.   Planned for d/c home today per chart and patient's care giver - IR follow up orders in place, scheduler will call patient with appointment date/time for cholangiogram. Will need to continue QD flushes with 5 cc NS, record output once daily on orange drain card placed in chart.    Please call IR with questions  or concerns.   Electronically Signed: Villa HerbShannon A Watterson, PA-C 05/28/2018, 9:46 AM   I spent a total of 25 Minutes at the the patient's bedside AND on the patient's hospital floor or unit, greater than 50% of which was counseling/coordinating care for cholecystostomy follow up.  \

## 2018-05-28 NOTE — Progress Notes (Addendum)
Occupational Therapy Treatment Patient Details Name: [name] MRN: [id number] DOB: 04/28/1950 Today's Date: 05/28/2018    History of present illness 68yo male presenting to the ED at [facility] with abdominal pain, foul urine, and fever. Found to have severe sepsis, UTI, SBO, and gangrenous cholecystitis, transferred to [facility] PMH mental disability (non-verbal at baseline), DM, HTN   OT comments  Pt making good progress toward OT goals. Pt completed bed mobility with min A. With direct simple cueing, pt able to transition to EOB with min guard and then complete SPT to [facility] with min A. Poor RW management, anticipate d/c of DME d/t cognition. Pt with redness on sacrum and positioned with pillow posteriorly to offset pressure.  D/c plan remains appropriate.    Follow Up Recommendations  Home health OT;Supervision/Assistance - 24 hour    Equipment Recommendations  None recommended by OT    Recommendations for Other Services      Precautions / Restrictions Precautions Precautions: Fall;Other (comment) Precaution Comments: non-verbal, can follow simple commands, abdominal drain  Restrictions Weight Bearing Restrictions: No       Mobility Bed Mobility Overal bed mobility: Needs Assistance Bed Mobility: Supine to Sit     Supine to sit: Min guard        Transfers Overall transfer level: Needs assistance Equipment used: None Transfers: Stand Pivot Transfers;Sit to/from Stand Sit to Stand: Min assist Stand pivot transfers: Min assist       General transfer comment: Min A steadying assist with RW    Balance Overall balance assessment: Needs assistance Sitting-balance support: Feet supported;No upper extremity supported Sitting balance-[name]: Good     Standing balance support: No upper extremity supported;During functional activity Standing balance-[name]: Fair                             ADL either performed or assessed with clinical  judgement   ADL Overall ADL's : Needs assistance/impaired                 Upper Body Dressing : Supervision/safety;Set up;Sitting   Lower Body Dressing: Minimal assistance   Toilet Transfer: RW;Minimal assistance   Toileting- Clothing Manipulation and Hygiene: Min guard;Sit to/from stand       Functional mobility during ADLs: Minimal assistance;Rolling walker;Cueing for safety General ADL Comments: Min A to power up from EOB, steadying assist during several steps of functional mobility to [facility]     Vision   Vision Assessment?: No apparent visual deficits          Cognition Arousal/Alertness: Awake/alert Behavior During Therapy: Flat affect;WFL for tasks assessed/performed Overall Cognitive Status: History of cognitive impairments - at baseline                                 General Comments: intellectual disability and non-verbal at baseline- able to follow simple commands and shake head "yes" or "no" for very simple questions               General Comments Aunt present throughout session    Pertinent Vitals/ Pain       Pain Assessment: Faces Faces Pain Scale: Hurts even more Pain Location: abdomen in general  Pain Descriptors / Indicators: Aching;Sore Pain Intervention(s): Limited activity within patient's tolerance;Monitored during session  Home Living  Frequency  Min 2X/week        Progress Toward Goals  OT Goals(current goals can now be found in the care plan section)  Progress towards OT goals: Progressing toward goals  Acute Rehab OT Goals Patient Stated Goal: go home  OT Goal Formulation: With family Time For Goal Achievement: 06/05/18 Potential to Achieve Goals: Good  Plan Discharge plan remains appropriate       AM-PAC OT "6 Clicks" Daily Activity     Outcome Measure   Help from another person eating meals?: A Little Help from another person taking care  of personal grooming?: A Little Help from another person toileting, which includes using toliet, bedpan, or urinal?: A Little Help from another person bathing (including washing, rinsing, drying)?: A Little Help from another person to put on and taking off regular upper body clothing?: A Little Help from another person to put on and taking off regular lower body clothing?: A Little 6 Click Score: 18    End of Session Equipment Utilized During Treatment: Rolling walker  OT Visit Diagnosis: Unsteadiness on feet (R26.81);Muscle weakness (generalized) (M62.81);Cognitive communication deficit (R41.841)   Activity Tolerance Patient tolerated treatment well   Patient Left in bed;with bed alarm set;with family/visitor present   Nurse Communication Mobility status        Time: 1454-1531 OT Time Calculation (min): 37 min  Charges: OT General Charges $OT Visit: 1 Visit OT Treatments $Self Care/Home Management : 23-37 mins   Crissie ReeseSandra H Aras Albarran OTR/L  05/28/2018, 3:52 PM

## 2018-05-28 NOTE — Progress Notes (Signed)
Patient given discharge instructions.  Patient's family member verbalized understanding of discharge instructions.  PIV's removed and area CDI.    Discharge vitals Today's Vitals   05/26/18 1909 05/27/18 1210 05/27/18 2141 05/28/18 0536  BP: 119/65  (!) 106/51 (!) 107/58  Pulse: 70  75 95  Resp: 17  16 16   Temp:  97.8 F (36.6 C) 97.6 F (36.4 C) 97.8 F (36.6 C)  TempSrc:  Oral Oral Oral  SpO2: 100%  98% 100%  Weight:      Height:      PainSc:       Body mass index is 25.26 kg/m.   Discharge meds Allergies as of 05/28/2018   No Known Allergies     Medication List    STOP taking these medications   saccharomyces boulardii 250 MG capsule Commonly known as:  FLORASTOR   vancomycin 50 mg/mL  oral solution Commonly known as:  VANCOCIN     TAKE these medications   amoxicillin-clavulanate 875-125 MG tablet Commonly known as:  AUGMENTIN Take 1 tablet by mouth every 12 (twelve) hours.   aspirin EC 81 MG tablet Take 81 mg by mouth every morning.   feeding supplement (GLUCERNA SHAKE) Liqd Take 237 mLs by mouth 3 (three) times daily between meals.   metFORMIN 500 MG tablet Commonly known as:  GLUCOPHAGE Take 1,000 mg by mouth every morning.   tamsulosin 0.4 MG Caps capsule Commonly known as:  FLOMAX Take 1 capsule (0.4 mg total) by mouth every evening.   TYLENOL ARTHRITIS PAIN PO Take 1 tablet by mouth every 6 (six) hours as needed (pain).            Durable Medical Equipment  (From admission, onward)         Start     Ordered   05/27/18 1003  For home use only DME 3 n 1  Once     05/27/18 1002   05/27/18 1003  For home use only DME Walker rolling  Once    Question:  Patient needs a walker to treat with the following condition  Answer:  Weakness   05/27/18 1002            Patient is being dressed by nursing tech and waiting for sister to come and pick him up.

## 2018-05-28 NOTE — Progress Notes (Signed)
[facility] Progress Note     Subjective: CC-  Patient nonverbal, family member at bedside. States that he has had no complaints. He ate 100% of his dinner without any nausea, vomiting, or abdominal complaints. He is about the eat breakfast. BM x3 yesterday.  She feels that abdominal distension is a little better. Xray yesterday showed gas filled loops of small and large bowel, no obstruction.  Objective: Vital signs in last 24 hours: Temp:  [97.6 F (36.4 C)-97.8 F (36.6 C)] 97.8 F (36.6 C) (02/12 0536) Pulse Rate:  [75-95] 95 (02/12 0536) Resp:  [16] 16 (02/12 0536) BP: (106-107)/(51-58) 107/58 (02/12 0536) SpO2:  [98 %-100 %] 100 % (02/12 0536) Last BM Date: 05/27/18  Intake/Output from previous day: 02/11 0701 - 02/12 0700 In: 1997.3 [P.O.:480; I.V.:1517.3] Out: 2760 [Urine:2750; Drains:10] Intake/Output this shift: No intake/output data recorded.  PE: Gen:  Alert, NAD, nonverbal Pulm:  effort normal Abd: Soft, distended, +BS, nontender, perc chole drain with minimal bilious drainage (10cc recorded last 24hr) Skin: warm and dry  Lab Results:  Recent Labs    05/26/18 0405 05/27/18 0408  WBC 9.4 10.4  HGB 8.7* 9.1*  HCT 28.4* 28.9*  PLT 206 223   BMET Recent Labs    05/26/18 0405 05/27/18 0408  NA 142 141  K 3.1* 3.9  CL 115* 112*  CO2 23 24  GLUCOSE 139* 137*  BUN 8 <5*  CREATININE 0.92 0.85  CALCIUM 7.9* 8.2*   PT/INR No results for input(s): LABPROT, INR in the last 72 hours. CMP     Component Value Date/Time   NA 141 05/27/2018 0408   K 3.9 05/27/2018 0408   CL 112 (H) 05/27/2018 0408   CO2 24 05/27/2018 0408   GLUCOSE 137 (H) 05/27/2018 0408   BUN <5 (L) 05/27/2018 0408   CREATININE 0.85 05/27/2018 0408   CALCIUM 8.2 (L) 05/27/2018 0408   PROT 6.1 (L) 05/26/2018 0405   ALBUMIN 1.7 (L) 05/26/2018 0405   AST 24 05/26/2018 0405   ALT 50 (H) 05/26/2018 0405   ALKPHOS 143 (H) 05/26/2018 0405   BILITOT 1.0 05/26/2018 0405    GFRNONAA >60 05/27/2018 0408   GFRAA >60 05/27/2018 0408   Lipase     Component Value Date/Time   LIPASE 11 06/19/2016 1344       Studies/Results: Dg Abd Portable 1v  Result Date: 05/27/2018 CLINICAL DATA:  Abdominal distension.  Cholecystostomy. EXAM: PORTABLE ABDOMEN - 1 VIEW COMPARISON:  CT 05/23/2018 FINDINGS: Percutaneous drainage catheter in the RIGHT upper quadrant consistent with cholecystostomy. Multiple gas-filled loops of large and small bowel. Stool in the rectum. Findings similar to CT of 05/23/2018. Multiple LEFT renal calculi noted. IMPRESSION: Gas distention of small bowel and colon similar to CT 05/23/2018. No evidence of high-grade obstruction. Electronically Signed   By: Genevive BiStewart  Edmunds M.D.   On: 05/27/2018 11:22    Anti-infectives: Anti-infectives (From admission, onward)   Start     Dose/Rate Route Frequency Ordered Stop   05/27/18 1000  amoxicillin-clavulanate (AUGMENTIN) 875-125 MG per tablet 1 tablet     1 tablet Oral Every 12 hours 05/27/18 0706     05/24/18 0600  piperacillin-tazobactam (ZOSYN) IVPB 3.375 g  Status:  Discontinued     3.375 g 12.5 mL/hr over 240 Minutes Intravenous Every 8 hours 05/24/18 0318 05/27/18 0706       Assessment/Plan MR HTN DM  acute cholecystitis, s/p perc chole drain  -will need drain injection in 4-5 weeks by  IR -follow up with [facility] around 6 weeks to discuss further plans regarding drain vs surgery etc  FEN -soft, adv as tolerates VTE -Lovenox ID -zosyn 2/8>>2/11, augmentin 2/11>> Follow up - [facility]  Plan: Patient tolerating diet and having bowel function. Stable for discharge from surgical standpoint with drain and augmentin. Follow up information on AVS.   LOS: 4 days    Franne Forts , [facility] 05/28/2018, 8:40 AM Pager: [phone] Mon-Thurs 7:00 am-4:30 pm Fri 7:00 am -11:30 AM Sat-Sun 7:00 am-11:30 am

## 2018-05-29 ENCOUNTER — Other Ambulatory Visit: Payer: Self-pay | Admitting: General Surgery

## 2018-05-29 DIAGNOSIS — K810 Acute cholecystitis: Secondary | ICD-10-CM

## 2018-05-29 LAB — CULTURE, BLOOD (ROUTINE X 2)
CULTURE: NO GROWTH
Culture: NO GROWTH

## 2018-06-08 ENCOUNTER — Encounter (HOSPITAL_COMMUNITY): Payer: Self-pay | Admitting: Emergency Medicine

## 2018-06-08 ENCOUNTER — Other Ambulatory Visit: Payer: Self-pay

## 2018-06-08 ENCOUNTER — Emergency Department (HOSPITAL_COMMUNITY)
Admission: EM | Admit: 2018-06-08 | Discharge: 2018-06-08 | Disposition: A | Discharge status: Home / Self Care | Payer: Medicare Other | Attending: Emergency Medicine | Admitting: Emergency Medicine

## 2018-06-08 DIAGNOSIS — E119 Type 2 diabetes mellitus without complications: Secondary | ICD-10-CM | POA: Insufficient documentation

## 2018-06-08 DIAGNOSIS — T83090A Other mechanical complication of cystostomy catheter, initial encounter: Secondary | ICD-10-CM | POA: Diagnosis not present

## 2018-06-08 DIAGNOSIS — I10 Essential (primary) hypertension: Secondary | ICD-10-CM | POA: Insufficient documentation

## 2018-06-08 DIAGNOSIS — Z79899 Other long term (current) drug therapy: Secondary | ICD-10-CM | POA: Diagnosis not present

## 2018-06-08 DIAGNOSIS — Z7984 Long term (current) use of oral hypoglycemic drugs: Secondary | ICD-10-CM | POA: Insufficient documentation

## 2018-06-08 DIAGNOSIS — Y828 Other medical devices associated with adverse incidents: Secondary | ICD-10-CM | POA: Insufficient documentation

## 2018-06-08 DIAGNOSIS — T85518A Breakdown (mechanical) of other gastrointestinal prosthetic devices, implants and grafts, initial encounter: Secondary | ICD-10-CM

## 2018-06-08 DIAGNOSIS — Z7982 Long term (current) use of aspirin: Secondary | ICD-10-CM | POA: Insufficient documentation

## 2018-06-08 NOTE — ED Triage Notes (Signed)
Patient puller end of his gallbladder drain off and broke the tube. Drain is still in place within right side of upper abdomin. NAD in triage.

## 2018-06-08 NOTE — ED Notes (Addendum)
Colostomy appliance placed over end of gallbladder drainage bag as a temporary fix to collect drainage until pt can be seen by IR tomorrow.

## 2018-06-08 NOTE — Discharge Instructions (Addendum)
You were evaluated in the Emergency Department and after careful evaluation, we did not find any emergent condition requiring admission or further testing in the hospital.  Your gallbladder drainage tube will need to be replaced.  We placed a temporary bag over the remaining tubing.  We have discussed your case with the interventional radiologist at [facility], who are aware of your situation and would like to replace this tube tomorrow.  Please call the interventional radiology department first thing tomorrow morning at 8 AM.  The number is [phone].  Given the patient's name and date of birth and they will give you a specific time to go to the interventional radiology department for tube replacement.  Please return to the Emergency Department if you experience any worsening of your condition.  We encourage you to follow up with a primary care provider.  Thank you for allowing [place] to be a part of your care.

## 2018-06-08 NOTE — ED Provider Notes (Signed)
[facility] Emergency Department Provider Note MRN:  [id number]  Arrival date & time: 06/08/18     Chief Complaint   Gallbladder drain problem   History of Present Illness   [name] is a 68 y.o. year-old male with a history of cognitive impairment, diabetes presenting to the ED with chief complaint of gallbladder drain problem.  Patient was recently diagnosed with gangrenous cholecystitis, had a drainage tube placed at [facility] 3 weeks ago.  Has been recovering well.  Patient's caregiver denies any recent fever, no abdominal pain, no complaints.  Patient accidentally pulled out the distal aspect of his tubing.  He then throughout the collecting bag and tubing.  Here to have it evaluated.  Review of Systems  A complete 10 system review of systems was obtained and all systems are negative except as noted in the HPI and PMH.   Patient's Health History    Past Medical History:  Diagnosis Date  . Diabetes mellitus without complication (HCC)   . History of gout   . History of kidney stones   . Hypertension   . Mentally challenged   . Urinary retention     Past Surgical History:  Procedure Laterality Date  . CYSTOSCOPY W/ URETERAL STENT PLACEMENT Bilateral 06/20/2016   Procedure: CYSTOSCOPY WITH RETROGRADE PYELOGRAM/URETERAL STENT PLACEMENT;  Surgeon: Malen Gauze, MD;  Location: [facility];  Service: Urology;  Laterality: Bilateral;  . CYSTOSCOPY WITH RETROGRADE PYELOGRAM, URETEROSCOPY AND STENT PLACEMENT Bilateral 08/20/2016   Procedure: CYSTOSCOPY WITH BILATERAL RETROGRADE PYELOGRAM, BILATERAL URETEROSCOPY,  LASER LITHOTRIPSY OF RIGHT URETERAL CALCULI, STONE BASKET EXTRACTION LEFT URETERAL CALCULI  AND BILATERAL URETERAL STENT EXCHANGE;  Surgeon: Malen Gauze, MD;  Location: [facility];  Service: Urology;  Laterality: Bilateral;  . HOLMIUM LASER APPLICATION Bilateral 08/20/2016   Procedure: HOLMIUM LASER APPLICATION;  Surgeon: Malen Gauze, MD;   Location: [facility];  Service: Urology;  Laterality: Bilateral;  . IR PERC CHOLECYSTOSTOMY  05/24/2018    Family History  Problem Relation Age of Onset  . Gallbladder disease Other     Social History   Socioeconomic History  . Marital status: Single    Spouse name: Not on file  . Number of children: Not on file  . Years of education: Not on file  . Highest education level: Not on file  Occupational History  . Not on file  Social Needs  . Financial resource strain: Not on file  . Food insecurity:    Worry: Not on file    Inability: Not on file  . Transportation needs:    Medical: Not on file    Non-medical: Not on file  Tobacco Use  . Smoking status: Never Smoker  . Smokeless tobacco: Never Used  Substance and Sexual Activity  . Alcohol use: No  . Drug use: No  . Sexual activity: Never    Birth control/protection: None  Lifestyle  . Physical activity:    Days per week: Not on file    Minutes per session: Not on file  . Stress: Not on file  Relationships  . Social connections:    Talks on phone: Not on file    Gets together: Not on file    Attends religious service: Not on file    Active member of club or organization: Not on file    Attends meetings of clubs or organizations: Not on file    Relationship status: Not on file  . Intimate partner violence:  Fear of current or ex partner: Not on file    Emotionally abused: Not on file    Physically abused: Not on file    Forced sexual activity: Not on file  Other Topics Concern  . Not on file  Social History Narrative   Patient is delayed lives with family     Physical Exam  Vital Signs and Nursing Notes reviewed Vitals:   06/08/18 2106 06/08/18 2130  BP: 122/72 108/64  Pulse: 76 69  Resp:  20  Temp:    SpO2: 98% 98%    CONSTITUTIONAL: Well-appearing, NAD NEURO:  Alert and interactive, moving all extremities EYES:  eyes equal and reactive ENT/NECK:  no LAD, no JVD CARDIO: Regular rate, well-perfused,  normal S1 and S2 PULM:  CTAB no wheezing or rhonchi GI/GU:  normal bowel sounds, non-distended, non-tender; biliary cholecystostomy tube in place, sutures intact, adhesive intact, end of tubing open and actively draining MSK/SPINE:  No gross deformities, no edema SKIN:  no rash, atraumatic PSYCH:  Appropriate speech and behavior  Diagnostic and Interventional Summary    EKG Interpretation  Date/Time:    Ventricular Rate:    PR Interval:    QRS Duration:   QT Interval:    QTC Calculation:   R Axis:     Text Interpretation:        Labs Reviewed - No data to display  No orders to display    Medications - No data to display   Procedures Critical Care  ED Course and Medical Decision Making  I have reviewed the triage vital signs and the nursing notes.  Pertinent labs & imaging results that were available during my care of the patient were reviewed by me and considered in my medical decision making (see below for details).  Uncomplicated visit for complication of cholecystostomy tube.  Vital signs are normal, well-appearing, benign abdomen, no recent fever or other complaints.  Distal tubing is removed and lost.  No evidence to suggest that the proximal portion involving the biliary system was dislodged, adhesive and sutures still in place without signs of trauma.  Discussed with Dr. Bonnielee Haff of interventional radiology, this tube will need to be replaced, but this is nonemergent situation requiring transfer this evening.  Will place colostomy bag over the remaining actively draining tubing to prevent mass and allow continued drainage.  Patient and caregiver advised that they will not need to flush the tube overnight.  Will call the IR department tomorrow morning to be fit into the schedule for replacement tomorrow at [facility].  Colostomy bag placed over the remaining tubing, which will temporize the situation until replacement tomorrow.  After the discussed management above, the  patient was determined to be safe for discharge.  The patient was in agreement with this plan and all questions regarding their care were answered.  ED return precautions were discussed and the patient will return to the ED with any significant worsening of condition.  Elmer Sow. Pilar Plate, MD [facility] Emergency Medicine [facility] [email or web address]  Final Clinical Impressions(s) / ED Diagnoses     ICD-10-CM   1. Cholecystostomy tube dysfunction, initial encounter T85.518A     ED Discharge Orders    None         Sabas Sous, MD 06/08/18 2222

## 2018-06-09 ENCOUNTER — Other Ambulatory Visit (HOSPITAL_COMMUNITY): Payer: Self-pay | Admitting: Interventional Radiology

## 2018-06-09 ENCOUNTER — Encounter (HOSPITAL_COMMUNITY): Payer: Self-pay | Admitting: Interventional Radiology

## 2018-06-09 ENCOUNTER — Ambulatory Visit (HOSPITAL_COMMUNITY)
Admission: RE | Admit: 2018-06-09 | Discharge: 2018-06-09 | Disposition: A | Discharge status: Home / Self Care | Payer: Medicare Other | Source: Ambulatory Visit | Attending: Interventional Radiology | Admitting: Interventional Radiology

## 2018-06-09 DIAGNOSIS — K8020 Calculus of gallbladder without cholecystitis without obstruction: Secondary | ICD-10-CM | POA: Insufficient documentation

## 2018-06-09 DIAGNOSIS — Z4803 Encounter for change or removal of drains: Secondary | ICD-10-CM | POA: Insufficient documentation

## 2018-06-09 DIAGNOSIS — Z434 Encounter for attention to other artificial openings of digestive tract: Secondary | ICD-10-CM

## 2018-06-09 HISTORY — PX: IR EXCHANGE BILIARY DRAIN: IMG6046

## 2018-06-09 MED ORDER — LIDOCAINE HCL 1 % IJ SOLN
INTRAMUSCULAR | Status: DC | PRN
  Administered 2018-06-09: 5 mL

## 2018-06-09 MED ORDER — IOPAMIDOL (ISOVUE-300) INJECTION 61%
INTRAVENOUS | Status: AC
  Administered 2018-06-09: 10 mL
  Filled 2018-06-09: qty 50

## 2018-06-09 MED ORDER — LIDOCAINE HCL 1 % IJ SOLN
INTRAMUSCULAR | Status: AC
  Filled 2018-06-09: qty 20

## 2018-06-10 ENCOUNTER — Other Ambulatory Visit (HOSPITAL_COMMUNITY)
Admission: AD | Admit: 2018-06-10 | Discharge: 2018-06-10 | Disposition: A | Discharge status: Home / Self Care | Payer: Medicare Other | Source: Skilled Nursing Facility | Attending: Internal Medicine | Admitting: Internal Medicine

## 2018-06-10 DIAGNOSIS — N390 Urinary tract infection, site not specified: Secondary | ICD-10-CM | POA: Insufficient documentation

## 2018-06-10 LAB — URINALYSIS, COMPLETE (UACMP) WITH MICROSCOPIC
Bilirubin Urine: NEGATIVE
Glucose, UA: NEGATIVE mg/dL
Ketones, ur: NEGATIVE mg/dL
Nitrite: POSITIVE — AB
Protein, ur: 30 mg/dL — AB
SPECIFIC GRAVITY, URINE: 1.019 (ref 1.005–1.030)
pH: 6 (ref 5.0–8.0)

## 2018-06-15 LAB — URINE CULTURE: Culture: >=100000 — AB

## 2018-06-19 ENCOUNTER — Other Ambulatory Visit: Payer: Medicare Other

## 2018-06-28 ENCOUNTER — Other Ambulatory Visit (HOSPITAL_COMMUNITY)
Admission: AD | Admit: 2018-06-28 | Discharge: 2018-06-28 | Disposition: A | Discharge status: Home / Self Care | Payer: Medicare Other | Source: Other Acute Inpatient Hospital | Attending: Internal Medicine | Admitting: Internal Medicine

## 2018-06-28 DIAGNOSIS — N3000 Acute cystitis without hematuria: Secondary | ICD-10-CM | POA: Insufficient documentation

## 2018-06-28 LAB — URINALYSIS, ROUTINE W REFLEX MICROSCOPIC
Bacteria, UA: NONE SEEN
Bilirubin Urine: NEGATIVE
GLUCOSE, UA: NEGATIVE mg/dL
Hgb urine dipstick: NEGATIVE
Ketones, ur: NEGATIVE mg/dL
Nitrite: NEGATIVE
PH: 6 (ref 5.0–8.0)
Protein, ur: NEGATIVE mg/dL
Specific Gravity, Urine: 1.013 (ref 1.005–1.030)

## 2018-06-30 LAB — URINE CULTURE: Culture: NO GROWTH

## 2018-07-01 ENCOUNTER — Other Ambulatory Visit (HOSPITAL_COMMUNITY): Payer: Self-pay | Admitting: General Surgery

## 2018-07-01 DIAGNOSIS — K810 Acute cholecystitis: Secondary | ICD-10-CM

## 2018-07-04 ENCOUNTER — Encounter (HOSPITAL_COMMUNITY): Payer: Self-pay | Admitting: Interventional Radiology

## 2018-07-04 ENCOUNTER — Ambulatory Visit (HOSPITAL_COMMUNITY)
Admission: RE | Admit: 2018-07-04 | Discharge: 2018-07-04 | Disposition: A | Discharge status: Home / Self Care | Payer: Medicare Other | Source: Ambulatory Visit | Attending: General Surgery | Admitting: General Surgery

## 2018-07-04 ENCOUNTER — Other Ambulatory Visit (HOSPITAL_COMMUNITY): Payer: Self-pay | Admitting: General Surgery

## 2018-07-04 ENCOUNTER — Other Ambulatory Visit: Payer: Self-pay

## 2018-07-04 DIAGNOSIS — K810 Acute cholecystitis: Secondary | ICD-10-CM

## 2018-07-04 DIAGNOSIS — Z4803 Encounter for change or removal of drains: Secondary | ICD-10-CM | POA: Diagnosis present

## 2018-07-04 DIAGNOSIS — K8020 Calculus of gallbladder without cholecystitis without obstruction: Secondary | ICD-10-CM | POA: Diagnosis not present

## 2018-07-04 HISTORY — PX: IR CATHETER TUBE CHANGE: IMG717

## 2018-07-04 MED ORDER — LIDOCAINE HCL 1 % IJ SOLN
INTRAMUSCULAR | Status: AC
  Filled 2018-07-04: qty 20

## 2018-07-04 MED ORDER — LIDOCAINE HCL 1 % IJ SOLN
INTRAMUSCULAR | Status: DC | PRN
  Administered 2018-07-04: 5 mL via INTRADERMAL

## 2018-07-04 MED ORDER — IOHEXOL 300 MG/ML  SOLN
50.0000 mL | Freq: Once | INTRAMUSCULAR | Status: AC | PRN
  Administered 2018-07-04: 20 mL

## 2018-07-04 NOTE — Procedures (Signed)
Pre procedural Dx: Acute Cholelithiasis, poor operative candidate. Post procedural Dx: Same  Technically successful [place] and Fluoro guided exchange and upsizing of a new 12 Fr drainage catheter. Chole tube connected to gravity bag.   EBL: Minimal  Complications: None immediate  PLAN:   - The patient's cholecystostomy tube will maintained to a gravity bag given presence of extensive cholelithiasis.    - the patient was given extra flushes and instructed to flush the cholecystostomy tube if he were to notice decreased output.    - Prolonged conversations were held with the patient's wife explaining that as the patient's gallbladder is no longer occluded, bile may drain both to the gravity bag as well as internally.  I explained that as long as he is not experiencing right upper quadrant abdominal pain and the catheter is flushing, fluoroscopic guided exchange is not required.  The patient's wife demonstrated fair understanding of this conversation.    Katherina Right, MD Pager #: [phone]

## 2018-07-21 ENCOUNTER — Other Ambulatory Visit (HOSPITAL_COMMUNITY): Payer: Medicare Other

## 2018-08-25 ENCOUNTER — Emergency Department (HOSPITAL_COMMUNITY): Payer: Medicare Other

## 2018-08-25 ENCOUNTER — Other Ambulatory Visit: Payer: Self-pay

## 2018-08-25 ENCOUNTER — Encounter (HOSPITAL_COMMUNITY): Payer: Self-pay

## 2018-08-25 ENCOUNTER — Encounter (HOSPITAL_COMMUNITY)
Admission: EM | Disposition: A | Discharge status: Skilled Nursing Facility | Payer: Self-pay | Source: Home / Self Care | Attending: Internal Medicine

## 2018-08-25 ENCOUNTER — Inpatient Hospital Stay (HOSPITAL_COMMUNITY)
Admission: EM | Admit: 2018-08-25 | Discharge: 2018-09-04 | DRG: 330 | Disposition: A | Discharge status: Skilled Nursing Facility | Payer: Medicare Other | Attending: Internal Medicine | Admitting: Internal Medicine

## 2018-08-25 DIAGNOSIS — K6389 Other specified diseases of intestine: Secondary | ICD-10-CM | POA: Diagnosis present

## 2018-08-25 DIAGNOSIS — K82A1 Gangrene of gallbladder in cholecystitis: Secondary | ICD-10-CM | POA: Diagnosis present

## 2018-08-25 DIAGNOSIS — I10 Essential (primary) hypertension: Secondary | ICD-10-CM | POA: Diagnosis present

## 2018-08-25 DIAGNOSIS — Z1159 Encounter for screening for other viral diseases: Secondary | ICD-10-CM | POA: Diagnosis not present

## 2018-08-25 DIAGNOSIS — K829 Disease of gallbladder, unspecified: Secondary | ICD-10-CM | POA: Diagnosis present

## 2018-08-25 DIAGNOSIS — E785 Hyperlipidemia, unspecified: Secondary | ICD-10-CM | POA: Diagnosis present

## 2018-08-25 DIAGNOSIS — E118 Type 2 diabetes mellitus with unspecified complications: Secondary | ICD-10-CM | POA: Diagnosis not present

## 2018-08-25 DIAGNOSIS — R4701 Aphasia: Secondary | ICD-10-CM | POA: Diagnosis present

## 2018-08-25 DIAGNOSIS — K56609 Unspecified intestinal obstruction, unspecified as to partial versus complete obstruction: Secondary | ICD-10-CM | POA: Diagnosis present

## 2018-08-25 DIAGNOSIS — K913 Postprocedural intestinal obstruction, unspecified as to partial versus complete: Secondary | ICD-10-CM | POA: Diagnosis not present

## 2018-08-25 DIAGNOSIS — Y838 Other surgical procedures as the cause of abnormal reaction of the patient, or of later complication, without mention of misadventure at the time of the procedure: Secondary | ICD-10-CM | POA: Diagnosis present

## 2018-08-25 DIAGNOSIS — E876 Hypokalemia: Secondary | ICD-10-CM | POA: Diagnosis present

## 2018-08-25 DIAGNOSIS — Z7983 Long term (current) use of bisphosphonates: Secondary | ICD-10-CM

## 2018-08-25 DIAGNOSIS — Z7982 Long term (current) use of aspirin: Secondary | ICD-10-CM

## 2018-08-25 DIAGNOSIS — K9189 Other postprocedural complications and disorders of digestive system: Secondary | ICD-10-CM | POA: Diagnosis present

## 2018-08-25 DIAGNOSIS — M109 Gout, unspecified: Secondary | ICD-10-CM | POA: Diagnosis present

## 2018-08-25 DIAGNOSIS — K559 Vascular disorder of intestine, unspecified: Secondary | ICD-10-CM | POA: Diagnosis not present

## 2018-08-25 DIAGNOSIS — Z7984 Long term (current) use of oral hypoglycemic drugs: Secondary | ICD-10-CM | POA: Diagnosis not present

## 2018-08-25 DIAGNOSIS — Z01818 Encounter for other preprocedural examination: Secondary | ICD-10-CM

## 2018-08-25 DIAGNOSIS — F79 Unspecified intellectual disabilities: Secondary | ICD-10-CM | POA: Diagnosis present

## 2018-08-25 DIAGNOSIS — N400 Enlarged prostate without lower urinary tract symptoms: Secondary | ICD-10-CM | POA: Diagnosis present

## 2018-08-25 DIAGNOSIS — Z960 Presence of urogenital implants: Secondary | ICD-10-CM | POA: Diagnosis present

## 2018-08-25 DIAGNOSIS — N179 Acute kidney failure, unspecified: Secondary | ICD-10-CM | POA: Diagnosis present

## 2018-08-25 DIAGNOSIS — E119 Type 2 diabetes mellitus without complications: Secondary | ICD-10-CM | POA: Diagnosis present

## 2018-08-25 DIAGNOSIS — Z4659 Encounter for fitting and adjustment of other gastrointestinal appliance and device: Secondary | ICD-10-CM

## 2018-08-25 DIAGNOSIS — K562 Volvulus: Secondary | ICD-10-CM

## 2018-08-25 DIAGNOSIS — K567 Ileus, unspecified: Secondary | ICD-10-CM

## 2018-08-25 DIAGNOSIS — R339 Retention of urine, unspecified: Secondary | ICD-10-CM | POA: Diagnosis present

## 2018-08-25 HISTORY — PX: LAPAROTOMY: SHX154

## 2018-08-25 LAB — COMPREHENSIVE METABOLIC PANEL
ALT: 11 U/L (ref 0–44)
AST: 17 U/L (ref 15–41)
Albumin: 4.4 g/dL (ref 3.5–5.0)
Alkaline Phosphatase: 79 U/L (ref 38–126)
Anion gap: 12 (ref 5–15)
BUN: 30 mg/dL — ABNORMAL HIGH (ref 8–23)
CO2: 23 mmol/L (ref 22–32)
Calcium: 10.2 mg/dL (ref 8.9–10.3)
Chloride: 101 mmol/L (ref 98–111)
Creatinine, Ser: 1.16 mg/dL (ref 0.61–1.24)
GFR calc Af Amer: >60 mL/min (ref >60)
GFR calc non Af Amer: >60 mL/min (ref >60)
Glucose, Bld: 164 mg/dL — ABNORMAL HIGH (ref 70–99)
Potassium: 4.2 mmol/L (ref 3.5–5.1)
Sodium: 136 mmol/L (ref 135–145)
Total Bilirubin: 0.4 mg/dL (ref 0.3–1.2)
Total Protein: 9.6 g/dL — ABNORMAL HIGH (ref 6.5–8.1)

## 2018-08-25 LAB — CBC
HCT: 49.4 % (ref 39.0–52.0)
Hemoglobin: 15.3 g/dL (ref 13.0–17.0)
MCH: 28.8 pg (ref 26.0–34.0)
MCHC: 31 g/dL (ref 30.0–36.0)
MCV: 93 fL (ref 80.0–100.0)
Platelets: 243 10*3/uL (ref 150–400)
RBC: 5.31 MIL/uL (ref 4.22–5.81)
RDW: 12.8 % (ref 11.5–15.5)
WBC: 11.4 10*3/uL — ABNORMAL HIGH (ref 4.0–10.5)
nRBC: 0 % (ref 0.0–0.2)

## 2018-08-25 LAB — LIPASE, BLOOD: Lipase: 30 U/L (ref 11–51)

## 2018-08-25 LAB — LACTIC ACID, PLASMA: Lactic Acid, Venous: 1.7 mmol/L (ref 0.5–1.9)

## 2018-08-25 LAB — SARS CORONAVIRUS 2 BY RT PCR (HOSPITAL ORDER, PERFORMED IN ~~LOC~~ HOSPITAL LAB): SARS Coronavirus 2: NEGATIVE

## 2018-08-25 SURGERY — LAPAROTOMY, EXPLORATORY
Anesthesia: General

## 2018-08-25 MED ORDER — PIPERACILLIN-TAZOBACTAM 3.375 G IVPB 30 MIN: 3.3750 g | Freq: Once | INTRAVENOUS | Status: DC

## 2018-08-25 MED ORDER — ONDANSETRON HCL 4 MG PO TABS: 4.0000 mg | ORAL_TABLET | Freq: Four times a day (QID) | ORAL | Status: DC | PRN

## 2018-08-25 MED ORDER — SODIUM CHLORIDE 0.9% FLUSH
3.0000 mL | Freq: Two times a day (BID) | INTRAVENOUS | Status: DC
  Administered 2018-08-26 – 2018-09-04 (×13): 3 mL via INTRAVENOUS

## 2018-08-25 MED ORDER — ONDANSETRON HCL 4 MG/2ML IJ SOLN
4.0000 mg | Freq: Four times a day (QID) | INTRAMUSCULAR | Status: DC | PRN
  Administered 2018-08-27: 16:00:00 4 mg via INTRAVENOUS
  Filled 2018-08-25: qty 2

## 2018-08-25 MED ORDER — BUPIVACAINE LIPOSOME 1.3 % IJ SUSP
INTRAMUSCULAR | Status: AC
  Filled 2018-08-25: qty 20

## 2018-08-25 MED ORDER — INSULIN ASPART 100 UNIT/ML ~~LOC~~ SOLN
0.0000 [IU] | SUBCUTANEOUS | Status: DC
  Administered 2018-08-26: 04:00:00 2 [IU] via SUBCUTANEOUS
  Administered 2018-08-26: 1 [IU] via SUBCUTANEOUS
  Administered 2018-08-26 (×2): 2 [IU] via SUBCUTANEOUS
  Administered 2018-08-27 – 2018-08-28 (×6): 1 [IU] via SUBCUTANEOUS
  Administered 2018-08-28: 2 [IU] via SUBCUTANEOUS
  Administered 2018-08-28: 1 [IU] via SUBCUTANEOUS
  Administered 2018-08-29: 2 [IU] via SUBCUTANEOUS
  Administered 2018-08-29: 1 [IU] via SUBCUTANEOUS
  Administered 2018-08-29: 2 [IU] via SUBCUTANEOUS
  Administered 2018-08-29 – 2018-09-02 (×9): 1 [IU] via SUBCUTANEOUS
  Administered 2018-09-03: 2 [IU] via SUBCUTANEOUS
  Administered 2018-09-03: 1 [IU] via SUBCUTANEOUS
  Administered 2018-09-03: 2 [IU] via SUBCUTANEOUS
  Administered 2018-09-03: 1 [IU] via SUBCUTANEOUS
  Administered 2018-09-03: 2 [IU] via SUBCUTANEOUS
  Administered 2018-09-03 – 2018-09-04 (×3): 1 [IU] via SUBCUTANEOUS
  Administered 2018-09-04: 13:00:00 2 [IU] via SUBCUTANEOUS
  Administered 2018-09-04 (×2): 1 [IU] via SUBCUTANEOUS

## 2018-08-25 MED ORDER — SODIUM CHLORIDE 0.9 % IV SOLN
INTRAVENOUS | Status: DC
  Administered 2018-08-26: 01:00:00 via INTRAVENOUS

## 2018-08-25 MED ORDER — ACETAMINOPHEN 650 MG RE SUPP: 650.0000 mg | Freq: Four times a day (QID) | RECTAL | Status: DC | PRN

## 2018-08-25 MED ORDER — PIPERACILLIN-TAZOBACTAM 3.375 G IVPB: 3.3750 g | Freq: Three times a day (TID) | INTRAVENOUS | Status: DC

## 2018-08-25 MED ORDER — ACETAMINOPHEN 325 MG PO TABS: 650.0000 mg | ORAL_TABLET | Freq: Four times a day (QID) | ORAL | Status: DC | PRN

## 2018-08-25 MED ORDER — SODIUM CHLORIDE 0.9% FLUSH
3.0000 mL | Freq: Once | INTRAVENOUS | Status: AC
  Administered 2018-09-03: 3 mL via INTRAVENOUS

## 2018-08-25 MED ORDER — IOHEXOL 300 MG/ML  SOLN
100.0000 mL | Freq: Once | INTRAMUSCULAR | Status: AC | PRN
  Administered 2018-08-25: 21:00:00 100 mL via INTRAVENOUS

## 2018-08-25 MED ORDER — SODIUM CHLORIDE 0.9 % IV SOLN
INTRAVENOUS | Status: DC
  Administered 2018-08-25: 23:00:00 via INTRAVENOUS

## 2018-08-25 MED ORDER — FENTANYL CITRATE (PF) 100 MCG/2ML IJ SOLN
50.0000 ug | Freq: Once | INTRAMUSCULAR | Status: AC
  Administered 2018-08-25: 50 ug via INTRAVENOUS
  Filled 2018-08-25: qty 2

## 2018-08-25 MED ORDER — FENTANYL CITRATE (PF) 100 MCG/2ML IJ SOLN
25.0000 ug | INTRAMUSCULAR | Status: DC | PRN
  Administered 2018-08-26 (×3): 50 ug via INTRAVENOUS
  Administered 2018-08-26: 100 ug via INTRAVENOUS
  Administered 2018-08-27: 25 ug via INTRAVENOUS
  Administered 2018-08-28 – 2018-08-29 (×2): 50 ug via INTRAVENOUS
  Filled 2018-08-25 (×6): qty 2

## 2018-08-25 MED ORDER — ONDANSETRON HCL 4 MG/2ML IJ SOLN
4.0000 mg | Freq: Once | INTRAMUSCULAR | Status: AC
  Administered 2018-08-25: 21:00:00 4 mg via INTRAVENOUS
  Filled 2018-08-25: qty 2

## 2018-08-25 SURGICAL SUPPLY — 70 items
APPLIER CLIP 11 MED OPEN (CLIP)
APPLIER CLIP 13 LRG OPEN (CLIP)
APR CLP MED 11 20 MLT OPN (CLIP)
BARRIER SKIN 2 3/4 (OSTOMY) IMPLANT
BARRIER SKIN 2 3/4 INCH (OSTOMY)
CELLS DAT CNTRL 66122 CELL SVR (MISCELLANEOUS) ×1 IMPLANT
CHLORAPREP W/TINT 26 (MISCELLANEOUS) ×3 IMPLANT
CLAMP POUCH DRAINAGE QUIET (OSTOMY) IMPLANT
CLIP APPLIE 11 MED OPEN (CLIP) IMPLANT
CLIP APPLIE 13 LRG OPEN (CLIP) IMPLANT
CLOTH BEACON ORANGE TIMEOUT ST (SAFETY) ×3 IMPLANT
COVER LIGHT HANDLE STERIS (MISCELLANEOUS) ×6 IMPLANT
DRAPE WARM FLUID 44X44 (DRAPE) ×3 IMPLANT
DRSG OPSITE POSTOP 4X10 (GAUZE/BANDAGES/DRESSINGS) ×3 IMPLANT
DRSG OPSITE POSTOP 4X8 (GAUZE/BANDAGES/DRESSINGS) IMPLANT
ELECT BLADE 6 FLAT ULTRCLN (ELECTRODE) IMPLANT
ELECT REM PT RETURN 9FT ADLT (ELECTROSURGICAL) ×3
ELECTRODE REM PT RTRN 9FT ADLT (ELECTROSURGICAL) ×1 IMPLANT
GLOVE BIO SURGEON STRL SZ 6.5 (GLOVE) ×2 IMPLANT
GLOVE BIO SURGEONS STRL SZ 6.5 (GLOVE) ×1
GLOVE BIOGEL M 6.5 STRL (GLOVE) ×3 IMPLANT
GLOVE BIOGEL PI IND STRL 6.5 (GLOVE) ×2 IMPLANT
GLOVE BIOGEL PI IND STRL 7.0 (GLOVE) ×2 IMPLANT
GLOVE BIOGEL PI IND STRL 7.5 (GLOVE) ×1 IMPLANT
GLOVE BIOGEL PI INDICATOR 6.5 (GLOVE) ×4
GLOVE BIOGEL PI INDICATOR 7.0 (GLOVE) ×4
GLOVE BIOGEL PI INDICATOR 7.5 (GLOVE) ×2
GLOVE SURG SS PI 7.5 STRL IVOR (GLOVE) ×3 IMPLANT
GOWN STRL REUS W/TWL LRG LVL3 (GOWN DISPOSABLE) ×9 IMPLANT
HANDLE SUCTION POOLE (INSTRUMENTS) ×1 IMPLANT
INST SET MAJOR GENERAL (KITS) ×3 IMPLANT
KIT REMOVER STAPLE SKIN (MISCELLANEOUS) IMPLANT
KIT TURNOVER KIT A (KITS) ×3 IMPLANT
LIGASURE IMPACT 36 18CM CVD LR (INSTRUMENTS) ×3 IMPLANT
MANIFOLD NEPTUNE II (INSTRUMENTS) ×3 IMPLANT
NEEDLE HYPO 18GX1.5 BLUNT FILL (NEEDLE) ×3 IMPLANT
NEEDLE HYPO 21X1.5 SAFETY (NEEDLE) ×3 IMPLANT
NEEDLE HYPO 25X5/8 SAFETYGLIDE (NEEDLE) ×3 IMPLANT
NS IRRIG 1000ML POUR BTL (IV SOLUTION) ×9 IMPLANT
PACK ABDOMINAL MAJOR (CUSTOM PROCEDURE TRAY) ×3 IMPLANT
PAD ARMBOARD 7.5X6 YLW CONV (MISCELLANEOUS) ×3 IMPLANT
POUCH OSTOMY 2 3/4  H 3804 (WOUND CARE)
POUCH OSTOMY 2 PC DRNBL 2.75 (WOUND CARE) IMPLANT
RELOAD LINEAR CUT PROX 55 BLUE (ENDOMECHANICALS) IMPLANT
RELOAD PROXIMATE 75MM BLUE (ENDOMECHANICALS) IMPLANT
RETRACTOR WND ALEXIS 25 LRG (MISCELLANEOUS) IMPLANT
RETRACTOR WND ALEXIS-O 25 LRG (MISCELLANEOUS) ×1 IMPLANT
RTRCTR WOUND ALEXIS 18CM MED (MISCELLANEOUS) ×3
RTRCTR WOUND ALEXIS 25CM LRG (MISCELLANEOUS)
RTRCTR WOUND ALEXIS O 25CM LRG (MISCELLANEOUS) ×3
SET BASIN LINEN APH (SET/KITS/TRAYS/PACK) ×3 IMPLANT
SPONGE LAP 18X18 RF (DISPOSABLE) ×3 IMPLANT
STAPLER GUN LINEAR PROX 60 (STAPLE) IMPLANT
STAPLER PROXIMATE 55 BLUE (STAPLE) IMPLANT
STAPLER PROXIMATE 75MM BLUE (STAPLE) IMPLANT
STAPLER VISISTAT (STAPLE) ×3 IMPLANT
SUCTION POOLE HANDLE (INSTRUMENTS) ×3
SUT CHROMIC 0 SH (SUTURE) ×3 IMPLANT
SUT CHROMIC 2 0 SH (SUTURE) ×3 IMPLANT
SUT CHROMIC 3 0 SH 27 (SUTURE) ×3 IMPLANT
SUT PDS AB 0 CTX 60 (SUTURE) ×3 IMPLANT
SUT PDS AB CT VIOLET #0 27IN (SUTURE) ×6 IMPLANT
SUT PROLENE 2 0 SH 30 (SUTURE) ×3 IMPLANT
SUT SILK 2 0 (SUTURE) ×2
SUT SILK 2-0 18XBRD TIE 12 (SUTURE) ×1 IMPLANT
SUT SILK 3 0 SH CR/8 (SUTURE) ×3 IMPLANT
SYR 20CC LL (SYRINGE) ×6 IMPLANT
SYRINGE 60CC LL (MISCELLANEOUS) ×3 IMPLANT
TRAY FOLEY W/BAG SLVR 16FR (SET/KITS/TRAYS/PACK) ×2
TRAY FOLEY W/BAG SLVR 16FR ST (SET/KITS/TRAYS/PACK) ×1 IMPLANT

## 2018-08-25 NOTE — Consult Note (Signed)
[facility] Consult  Reason for Consult: High grade SBO with pneumatosis  Referring Physician:  Dr. Juleen China   Chief Complaint    Abdominal Pain      [name] is a 68 y.o. male.  HPI: [name] is a 68 yo who is nonverbal and is under the care of his guardian, Aunt/ [name].  He has been having some reported abdominal pain and nausea/vomiting and no BM in the last four days. This has been getting progressively worse and his abdomen is very distended. [name] says that his abdomen gets this distended at times. He has recently had an episode of pneumoperitoneum 03/2018 thought to be related to C difficile infection, gangrenous cholecystitis 05/2018 s/p cholecystostomy tube, and at that time also had a SBO that improved.  He has never had surgery but has had the perforation. [name] also comments on several people in the family having Crohn's disease.   He has been around no sick contacts or COVID contacts.  He has DM, HTN, is catheterized as he has urinary retention. He does not have BMs daily and has to get some laxatives at baseline. He has never had a colonoscopy.    Past Medical History:  Diagnosis Date  . Diabetes mellitus without complication (HCC)   . History of gout   . History of kidney stones   . Hypertension   . Mentally challenged   . Urinary retention     Past Surgical History:  Procedure Laterality Date  . CYSTOSCOPY W/ URETERAL STENT PLACEMENT Bilateral 06/20/2016   Procedure: CYSTOSCOPY WITH RETROGRADE PYELOGRAM/URETERAL STENT PLACEMENT;  Surgeon: Malen Gauze, MD;  Location: [facility];  Service: Urology;  Laterality: Bilateral;  . CYSTOSCOPY WITH RETROGRADE PYELOGRAM, URETEROSCOPY AND STENT PLACEMENT Bilateral 08/20/2016   Procedure: CYSTOSCOPY WITH BILATERAL RETROGRADE PYELOGRAM, BILATERAL URETEROSCOPY,  LASER LITHOTRIPSY OF RIGHT URETERAL CALCULI, STONE BASKET EXTRACTION LEFT URETERAL CALCULI  AND BILATERAL URETERAL STENT EXCHANGE;  Surgeon: Malen Gauze, MD;  Location: [facility];  Service: Urology;  Laterality: Bilateral;  . HOLMIUM LASER APPLICATION Bilateral 08/20/2016   Procedure: HOLMIUM LASER APPLICATION;  Surgeon: Malen Gauze, MD;  Location: [facility];  Service: Urology;  Laterality: Bilateral;  . IR CATHETER TUBE CHANGE  07/04/2018  . IR EXCHANGE BILIARY DRAIN  06/09/2018  . IR PERC CHOLECYSTOSTOMY  05/24/2018    Family History  Problem Relation Age of Onset  . Gallbladder disease Other     Social History   Tobacco Use  . Smoking status: Never Smoker  . Smokeless tobacco: Never Used  Substance Use Topics  . Alcohol use: No  . Drug use: No    Medications:  I have reviewed the patient's current medications. Current Facility-Administered Medications  Medication Dose Route Frequency Provider Last Rate Last Dose  . 0.9 %  sodium chloride infusion   Intravenous Continuous Opyd, Lavone Neri, MD      . acetaminophen (TYLENOL) tablet 650 mg  650 mg Oral Q6H PRN Opyd, Lavone Neri, MD       Or  . acetaminophen (TYLENOL) suppository 650 mg  650 mg Rectal Q6H PRN Opyd, Lavone Neri, MD      . fentaNYL (SUBLIMAZE) injection 25-50 mcg  25-50 mcg Intravenous Q2H PRN Opyd, Lavone Neri, MD      . Melene Muller ON 08/26/2018] insulin aspart (novoLOG) injection 0-9 Units  0-9 Units Subcutaneous Q4H Opyd, Lavone Neri, MD      . ondansetron (ZOFRAN) tablet 4 mg  4 mg Oral Q6H  PRN Opyd, Lavone Neri, MD       Or  . ondansetron (ZOFRAN) injection 4 mg  4 mg Intravenous Q6H PRN Opyd, Lavone Neri, MD      . Melene Muller ON 08/26/2018] piperacillin-tazobactam (ZOSYN) IVPB 3.375 g  3.375 g Intravenous Once Opyd, Lavone Neri, MD      . Melene Muller ON 08/26/2018] piperacillin-tazobactam (ZOSYN) IVPB 3.375 g  3.375 g Intravenous Q8H Opyd, Timothy S, MD      . sodium chloride flush (NS) 0.9 % injection 3 mL  3 mL Intravenous Once Opyd, Lavone Neri, MD      . sodium chloride flush (NS) 0.9 % injection 3 mL  3 mL Intravenous Q12H Opyd, Lavone Neri, MD       Current Outpatient Medications   Medication Sig Dispense Refill Last Dose  . Acetaminophen (TYLENOL ARTHRITIS PAIN PO) Take 1 tablet by mouth every 6 (six) hours as needed (pain).   08/25/2018 at [facility]  . aspirin EC 81 MG tablet Take 81 mg by mouth every morning.    08/25/2018 at Unknown time  . feeding supplement, GLUCERNA SHAKE, (GLUCERNA SHAKE) LIQD Take 237 mLs by mouth 3 (three) times daily between meals. 90 Can 0 08/25/2018 at Unknown time  . metFORMIN (GLUCOPHAGE) 500 MG tablet Take 1,000 mg by mouth every morning.  3 08/25/2018 at Unknown time  . tamsulosin (FLOMAX) 0.4 MG CAPS capsule Take 1 capsule (0.4 mg total) by mouth every evening. 30 capsule 1 08/24/2018 at Unknown time  RUE:[id number] **OR** acetaminophen, fentaNYL (SUBLIMAZE) injection, ondansetron **OR** ondansetron (ZOFRAN) IV  No Known Allergies   ROS:  unable to obtain fully due to patient being nonverbal abdominal pain and distention Nausea and vomiting   Blood pressure 124/85, pulse 94, resp. rate 18, height  (1.702 m), weight 68.3 kg, SpO2 96 %. Physical Exam Vitals signs reviewed.  HENT:     Head: Normocephalic.  Cardiovascular:     Rate and Rhythm: Normal rate.  Pulmonary:     Effort: Pulmonary effort is normal.  Abdominal:     General: Abdomen is protuberant. There is distension.     Palpations: Abdomen is soft.     Tenderness: There is generalized abdominal tenderness.     Comments: Tympanic and round abdomen  Skin:    General: Skin is warm and dry.  Neurological:     General: No focal deficit present.     Mental Status: Mental status is at baseline.     Comments: nonverbal  Psychiatric:     Comments: At baseline, nonverbal     Results: Results for orders placed or performed during the hospital encounter of 08/25/18 (from the past 48 hour(s))  Lipase, blood     Status: None   Collection Time: 08/25/18  8:20 PM  Result Value Ref Range   Lipase 30 11 - 51 U/L    Comment: Performed at [facility], [address],  [place], [place] [address]  Comprehensive metabolic panel     Status: Abnormal   Collection Time: 08/25/18  8:20 PM  Result Value Ref Range   Sodium 136 135 - 145 mmol/L   Potassium 4.2 3.5 - 5.1 mmol/L   Chloride 101 98 - 111 mmol/L   CO2 23 22 - 32 mmol/L   Glucose, Bld 164 (H) 70 - 99 mg/dL   BUN 30 (H) 8 - 23 mg/dL   Creatinine, Ser 1.30 0.61 - 1.24 mg/dL   Calcium 86.5 8.9 - 78.4 mg/dL   Total  Protein 9.6 (H) 6.5 - 8.1 g/dL   Albumin 4.4 3.5 - 5.0 g/dL   AST 17 15 - 41 U/L   ALT 11 0 - 44 U/L   Alkaline Phosphatase 79 38 - 126 U/L   Total Bilirubin 0.4 0.3 - 1.2 mg/dL   GFR calc non Af Amer >60 >60 mL/min   GFR calc Af Amer >60 >60 mL/min   Anion gap 12 5 - 15    Comment: Performed at [facility], [address] [place], [place] [address]  CBC     Status: Abnormal   Collection Time: 08/25/18  8:20 PM  Result Value Ref Range   WBC 11.4 (H) 4.0 - 10.5 K/uL   RBC 5.31 4.22 - 5.81 MIL/uL   Hemoglobin 15.3 13.0 - 17.0 g/dL   HCT 40.949.4 81.139.0 - 91.452.0 %   MCV 93.0 80.0 - 100.0 fL   MCH 28.8 26.0 - 34.0 pg   MCHC 31.0 30.0 - 36.0 g/dL   RDW 78.212.8 95.611.5 - 21.315.5 %   Platelets 243 150 - 400 K/uL   nRBC 0.0 0.0 - 0.2 %    Comment: Performed at [facility], [address] [place], [place] [address]  Lactic acid, plasma     Status: None   Collection Time: 08/25/18 10:09 PM  Result Value Ref Range   Lactic Acid, Venous 1.7 0.5 - 1.9 mmol/L    Comment: Performed at [facility], [address] [place], [place] [address]  SARS Coronavirus 2 (CEPHEID - Performed in [facility] hospital lab), Hosp Order     Status: None   Collection Time: 08/25/18 10:30 PM  Result Value Ref Range   SARS Coronavirus 2 NEGATIVE NEGATIVE    Comment: (NOTE) If result is NEGATIVE SARS-CoV-2 target nucleic acids are NOT DETECTED. The SARS-CoV-2 RNA is generally detectable in upper and lower  respiratory specimens during the acute phase of infection. The lowest  concentration of SARS-CoV-2 viral copies this assay  can detect is 250  copies / mL. A negative result does not preclude SARS-CoV-2 infection  and should not be used as the sole basis for treatment or other  patient management decisions.  A negative result may occur with  improper specimen collection / handling, submission of specimen other  than nasopharyngeal swab, presence of viral mutation(s) within the  areas targeted by this assay, and inadequate number of viral copies  (<250 copies / mL). A negative result must be combined with clinical  observations, patient history, and epidemiological information. If result is POSITIVE SARS-CoV-2 target nucleic acids are DETECTED. The SARS-CoV-2 RNA is generally detectable in upper and lower  respiratory specimens dur ing the acute phase of infection.  Positive  results are indicative of active infection with SARS-CoV-2.  Clinical  correlation with patient history and other diagnostic information is  necessary to determine patient infection status.  Positive results do  not rule out bacterial infection or co-infection with other viruses. If result is PRESUMPTIVE POSTIVE SARS-CoV-2 nucleic acids MAY BE PRESENT.   A presumptive positive result was obtained on the submitted specimen  and confirmed on repeat testing.  While 2019 novel coronavirus  (SARS-CoV-2) nucleic acids may be present in the submitted sample  additional confirmatory testing may be necessary for epidemiological  and / or clinical management purposes  to differentiate between  SARS-CoV-2 and other Sarbecovirus currently known to infect humans.  If clinically indicated additional testing with an alternate test  methodology [phone]([id number]) is advised. The SARS-CoV-2  RNA is generally  detectable in upper and lower respiratory sp ecimens during the acute  phase of infection. The expected result is Negative. Fact Sheet for Patients:  BoilerBrush.com.cy Fact Sheet for Healthcare  Providers: https://pope.com/ This test is not yet approved or cleared by the [place] FDA and has been authorized for detection and/or diagnosis of SARS-CoV-2 by FDA under an Emergency Use Authorization (EUA).  This EUA will remain in effect (meaning this test can be used) for the duration of the COVID-19 declaration under Section 564(b)(1) of the Act, 21 U.S.C. section 360bbb-3(b)(1), unless the authorization is terminated or revoked sooner. Performed at [facility], [address], [place], [place] [address]    CT personally reviewed- SB dilated to 7+cm and stomach dilated with extensive debride, possible transition in the mid-abdomen, pneumatosis noted tracking along the vessels in several places    Ct Abdomen Pelvis W Contrast  Result Date: 08/25/2018 CLINICAL DATA:  68 year old male with abdominal distension and pain and nausea. EXAM: CT ABDOMEN AND PELVIS WITH CONTRAST TECHNIQUE: Multidetector CT imaging of the abdomen and pelvis was performed using the standard protocol following bolus administration of intravenous contrast. CONTRAST:  OMNIPAQUE IOHEXOL 300 MG/ML  SOLN COMPARISON:  CT of the abdomen pelvis dated 05/23/2018 FINDINGS: Lower chest: Bibasilar dependent atelectatic changes noted. There is a 4 mm left lower lobe subpleural nodule. No definite intra-abdominal free air. No free fluid. Hepatobiliary: The liver is unremarkable. The gallbladder is decompressed around a percutaneous cholecystostomy tube. Pancreas: Unremarkable. No pancreatic ductal dilatation or surrounding inflammatory changes. Spleen: Normal in size without focal abnormality. Adrenals/Urinary Tract: The adrenal glands are unremarkable. Multiple large and staghorn left renal calculi noted measuring up to 17 mm in the interpolar aspect of the kidney. There is a 16 mm stone or cluster of stones in the left renal pelvis with partial obstruction of the UPJ and resulting mild left  hydronephrosis. There is no hydronephrosis on the right. The visualized ureters appear unremarkable. There are several stones within the urinary bladder. There is diffuse trabeculation of the bladder wall likely related to chronic bladder outlet obstruction or chronic inflammation. Stomach/Bowel: The stomach is distended with air and ingested content. There is retained ingested material within the visualized esophagus which may represent reflux or delayed esophageal clearance and dysmotility. There is mild thickening of the gastroesophageal junction which may be related to chronic and recurrent reflux. There are multiple dilated loops of small bowel with air-fluid levels measuring up to 7.5 cm in diameter. There is a probable transition in the mid abdomen (series 2, image 60 and coronal series 5, image 51). There is large amount of stool throughout the colon. Vascular/Lymphatic: There is gas within mesenteric vessels (series 2, image 49 and 47 and 50) concerning for bowel ischemia. Clinical correlation and surgical consult is advised. No gas is noted within the portal veins. The abdominal aorta is unremarkable. The origins of the celiac axis, SMA, IMA are patent. The SMV, splenic vein, and main portal vein are patent. There is no adenopathy. Reproductive: Multiple fiducial markers noted within the prostate gland. Other: None Musculoskeletal: Degenerative changes of the spine. No acute osseous pathology. IMPRESSION: 1. High-grade small bowel obstruction with pneumatosis of the mesenteric vessels concerning for bowel ischemia. Clinical correlation and surgical consult is advised. 2. Distended stomach with ingested content in the distal esophagus. 3. Multiple nonobstructing left renal calculi. A partially obstructing stone or string of adjacent stones in the left UPJ with associated mild left hydronephrosis. Multiple  bladder calculi. 4. Percutaneous cholecystostomy tube with decompression of the gallbladder. These  results were called by telephone at the time of interpretation on 08/25/2018 at 9:58 pm to Dr. Raeford Razor , who verbally acknowledged these results. Electronically Signed   By: Elgie Collard M.D.   On: 08/25/2018 22:00   Dg Chest Port 1 View  Result Date: 08/25/2018 CLINICAL DATA:  Preoperative imaging EXAM: PORTABLE CHEST 1 VIEW COMPARISON:  None. FINDINGS: Shallow lung inflation with bibasilar atelectasis. No focal airspace consolidation. Cardiomediastinal contours are normal. IMPRESSION: No acute airspace disease. Electronically Signed   By: Deatra Robinson M.D.   On: 08/25/2018 23:09    Assessment & Plan:  COE SERIGHT is a 68 y.o. male with a high grade small bowel obstruction with several dilated small bowel and concern for ischemia due to pneumatosis. He is not unstable and has a normal HR and BP. His labs are also reassuring. This being said he has severely dilated bowel and this is at risk for ischemia.  His aunt [name] is at the bedside.   -We discussed possibility of ischemic bowel and need for resection. We discussed possible need for pressors and medications for his heart if he were to get sick. We discussed the risk of aspiration given the stomach contents and no NG at this time. Will get RN to re-attempt NG.  We discussed potential need for transfer and potential need for remaining intubated. We discussed that the patient is at risk for delirium and confusion while in the hospital.  We discussed that we are not going to do anything with the cholecystostomy tube as this could cause further harm or make him sicker. We have discussed that I could find something like cancer/  We discussed that he may need SNF after surgery. We discussed that he is a full code and that [name] would want chest compressions. She is unsure if she would want him to have something like a trach.    -CXR without infiltrate, COVID 19 negative  -Consent obtained -RN to reattempt NG  -Hospitalist admitting,  appreciate assistance  -Is catheterized at baseline, will need foley immediately post op, guardian afraid he may pull this out   All questions were answered to the satisfaction of the family.  After careful consideration, [name] guardian, [name] has decided to proceed.    [name] 08/25/2018, 11:45 PM

## 2018-08-25 NOTE — H&P (Signed)
History and Physical    [name]:[id number] DOB: 11-May-1950 DOA: 08/25/2018  PCP: Pearson Grippe, MD   Patient coming from: Home   Chief Complaint: Abdominal pain, anorexia, N/V   HPI: [name] is a 68 y.o. male with medical history significant for intellectual disability, type 2 diabetes mellitus, BPH, and admission in February for acute gangrenous cholecystitis now with percutaneous drain in place, presenting to the emergency department for evaluation of abdominal pain, anorexia, nausea, and nonbloody vomiting.  Patient is nonverbal and is accompanied by his caretaker who provides the history.  He has been rubbing his abdomen and grimacing today after a few days of progressive abdominal distention.  He is not eating or drinking anything today despite encouragement to do so, and has now developed right current vomiting.  His last bowel movement was 4 days ago.  Patient had a small bowel obstruction during his admission in February that fortunately resolved with conservative management.  Per report of his caretaker, he has not been coughing and has not appeared to be short of breath.  ED Course: Upon arrival to the ED, patient is found to appear uncomfortable with normal vital signs.  EKG features a sinus rhythm with LVH.  Chest x-ray is negative for acute cardiopulmonary disease.  CT abdomen/pelvis is concerning for high-grade small bowel obstruction with pneumatosis of mesenteric vessels concerning for ischemia.  Chemistry panel is unremarkable and CBC features a mild leukocytosis.  COVID-19 screening test is negative and lactic acid is reassuringly normal.  NG tube was placed in the ED but the patient pulled this out.  He was treated with fentanyl and surgery was consulted by the ED physician, recommended medical admission.  Review of Systems:  All other systems reviewed and apart from HPI, are negative.  Past Medical History:  Diagnosis Date   Diabetes mellitus without  complication (HCC)    History of gout    History of kidney stones    Hypertension    Mentally challenged    Urinary retention     Past Surgical History:  Procedure Laterality Date   CYSTOSCOPY W/ URETERAL STENT PLACEMENT Bilateral 06/20/2016   Procedure: CYSTOSCOPY WITH RETROGRADE PYELOGRAM/URETERAL STENT PLACEMENT;  Surgeon: Malen Gauze, MD;  Location: [facility];  Service: Urology;  Laterality: Bilateral;   CYSTOSCOPY WITH RETROGRADE PYELOGRAM, URETEROSCOPY AND STENT PLACEMENT Bilateral 08/20/2016   Procedure: CYSTOSCOPY WITH BILATERAL RETROGRADE PYELOGRAM, BILATERAL URETEROSCOPY,  LASER LITHOTRIPSY OF RIGHT URETERAL CALCULI, STONE BASKET EXTRACTION LEFT URETERAL CALCULI  AND BILATERAL URETERAL STENT EXCHANGE;  Surgeon: Malen Gauze, MD;  Location: [facility];  Service: Urology;  Laterality: Bilateral;   HOLMIUM LASER APPLICATION Bilateral 08/20/2016   Procedure: HOLMIUM LASER APPLICATION;  Surgeon: Malen Gauze, MD;  Location: [facility];  Service: Urology;  Laterality: Bilateral;   IR CATHETER TUBE CHANGE  07/04/2018   IR EXCHANGE BILIARY DRAIN  06/09/2018   IR PERC CHOLECYSTOSTOMY  05/24/2018     reports that he has never smoked. He has never used smokeless tobacco. He reports that he does not drink alcohol or use drugs.  No Known Allergies  Family History  Problem Relation Age of Onset   Gallbladder disease Other      Prior to Admission medications   Medication Sig Start Date End Date Taking? Authorizing Provider  Acetaminophen (TYLENOL ARTHRITIS PAIN PO) Take 1 tablet by mouth every 6 (six) hours as needed (pain).   Yes [provider]  aspirin EC 81 MG tablet Take 81  mg by mouth every morning.    Yes [provider]  feeding supplement, GLUCERNA SHAKE, (GLUCERNA SHAKE) LIQD Take 237 mLs by mouth 3 (three) times daily between meals. 05/28/18  Yes Ghimire, Werner LeanShanker M, MD  metFORMIN (GLUCOPHAGE) 500 MG tablet Take 1,000 mg by mouth every morning.  02/03/18  Yes [provider]  tamsulosin (FLOMAX) 0.4 MG CAPS capsule Take 1 capsule (0.4 mg total) by mouth every evening. 04/12/18  Yes Erick BlinksMemon, Jehanzeb, MD    Physical Exam: Vitals:   08/25/18 1954 08/25/18 1955 08/25/18 2315 08/25/18 2321  BP: (!) 141/83   124/85  Pulse: 87   94  Resp: 17  16 18   SpO2: 98%   96%  Weight:  68.3 kg    Height:  5\' 7"  (1.702 m)      Constitutional: NAD, calm  Eyes: PERTLA, lids and conjunctivae normal ENMT: Mucous membranes are moist. Posterior pharynx clear of any exudate or lesions.   Neck: normal, supple, no masses, no thyromegaly Respiratory:  no wheezing, no crackles. No accessory muscle use.  Cardiovascular: S1 & S2 heard, regular rate and rhythm. Trace pedal edema bilaterally. Abdomen: Distended, generally tender. Rare high-pitched bowel sound.  Musculoskeletal: no clubbing / cyanosis. No joint deformity upper and lower extremities.    Skin: no significant rashes, lesions, ulcers. Warm, dry, well-perfused. Neurologic: no gross facial asymmetry. patellar DTR normal. Moving all extremities.  Psychiatric: Alert. Nonverbal. Calm.    Labs on Admission: I have personally reviewed following labs and imaging studies  CBC: Recent Labs  Lab 08/25/18 2020  WBC 11.4*  HGB 15.3  HCT 49.4  MCV 93.0  PLT 243   Basic Metabolic Panel: Recent Labs  Lab 08/25/18 2020  NA 136  K 4.2  CL 101  CO2 23  GLUCOSE 164*  BUN 30*  CREATININE 1.16  CALCIUM 10.2   GFR: Estimated Creatinine Clearance: 57 mL/min (by C-G formula based on SCr of 1.16 mg/dL). Liver Function Tests: Recent Labs  Lab 08/25/18 2020  AST 17  ALT 11  ALKPHOS 79  BILITOT 0.4  PROT 9.6*  ALBUMIN 4.4   Recent Labs  Lab 08/25/18 2020  LIPASE 30   No results for input(s): AMMONIA in the last 168 hours. Coagulation Profile: No results for input(s): INR, PROTIME in the last 168 hours. Cardiac Enzymes: No results for input(s): CKTOTAL, CKMB, CKMBINDEX,  TROPONINI in the last 168 hours. BNP (last 3 results) No results for input(s): PROBNP in the last 8760 hours. HbA1C: No results for input(s): HGBA1C in the last 72 hours. CBG: No results for input(s): GLUCAP in the last 168 hours. Lipid Profile: No results for input(s): CHOL, HDL, LDLCALC, TRIG, CHOLHDL, LDLDIRECT in the last 72 hours. Thyroid Function Tests: No results for input(s): TSH, T4TOTAL, FREET4, T3FREE, THYROIDAB in the last 72 hours. Anemia Panel: No results for input(s): VITAMINB12, FOLATE, FERRITIN, TIBC, IRON, RETICCTPCT in the last 72 hours. Urine analysis:    Component Value Date/Time   COLORURINE YELLOW 06/28/2018 1245   APPEARANCEUR CLEAR 06/28/2018 1245   LABSPEC 1.013 06/28/2018 1245   PHURINE 6.0 06/28/2018 1245   GLUCOSEU NEGATIVE 06/28/2018 1245   HGBUR NEGATIVE 06/28/2018 1245   BILIRUBINUR NEGATIVE 06/28/2018 1245   KETONESUR NEGATIVE 06/28/2018 1245   PROTEINUR NEGATIVE 06/28/2018 1245   UROBILINOGEN 1.0 07/20/2014 1718   NITRITE NEGATIVE 06/28/2018 1245   LEUKOCYTESUR SMALL (A) 06/28/2018 1245   Sepsis Labs: @LABRCNTIP (procalcitonin:4,lacticidven:4) ) Recent Results (from the past 240 hour(s))  SARS Coronavirus 2 (  CEPHEID - Performed in [facility] hospital lab), Hosp Order     Status: None   Collection Time: 08/25/18 10:30 PM  Result Value Ref Range Status   SARS Coronavirus 2 NEGATIVE NEGATIVE Final    Comment: (NOTE) If result is NEGATIVE SARS-CoV-2 target nucleic acids are NOT DETECTED. The SARS-CoV-2 RNA is generally detectable in upper and lower  respiratory specimens during the acute phase of infection. The lowest  concentration of SARS-CoV-2 viral copies this assay can detect is 250  copies / mL. A negative result does not preclude SARS-CoV-2 infection  and should not be used as the sole basis for treatment or other  patient management decisions.  A negative result may occur with  improper specimen collection / handling, submission of  specimen other  than nasopharyngeal swab, presence of viral mutation(s) within the  areas targeted by this assay, and inadequate number of viral copies  (<250 copies / mL). A negative result must be combined with clinical  observations, patient history, and epidemiological information. If result is POSITIVE SARS-CoV-2 target nucleic acids are DETECTED. The SARS-CoV-2 RNA is generally detectable in upper and lower  respiratory specimens dur ing the acute phase of infection.  Positive  results are indicative of active infection with SARS-CoV-2.  Clinical  correlation with patient history and other diagnostic information is  necessary to determine patient infection status.  Positive results do  not rule out bacterial infection or co-infection with other viruses. If result is PRESUMPTIVE POSTIVE SARS-CoV-2 nucleic acids MAY BE PRESENT.   A presumptive positive result was obtained on the submitted specimen  and confirmed on repeat testing.  While 2019 novel coronavirus  (SARS-CoV-2) nucleic acids may be present in the submitted sample  additional confirmatory testing may be necessary for epidemiological  and / or clinical management purposes  to differentiate between  SARS-CoV-2 and other Sarbecovirus currently known to infect humans.  If clinically indicated additional testing with an alternate test  methodology [phone]) is advised. The SARS-CoV-2 RNA is generally  detectable in upper and lower respiratory sp ecimens during the acute  phase of infection. The expected result is Negative. Fact Sheet for Patients:  BoilerBrush.com.cy Fact Sheet for Healthcare Providers: https://pope.com/ This test is not yet approved or cleared by the [place] FDA and has been authorized for detection and/or diagnosis of SARS-CoV-2 by FDA under an Emergency Use Authorization (EUA).  This EUA will remain in effect (meaning this test can be used) for the  duration of the COVID-19 declaration under Section 564(b)(1) of the Act, 21 U.S.C. section 360bbb-3(b)(1), unless the authorization is terminated or revoked sooner. Performed at [facility], [address] [place], [place] [address]      Radiological Exams on Admission: Ct Abdomen Pelvis W Contrast  Result Date: 08/25/2018 CLINICAL DATA:  69 year old male with abdominal distension and pain and nausea. EXAM: CT ABDOMEN AND PELVIS WITH CONTRAST TECHNIQUE: Multidetector CT imaging of the abdomen and pelvis was performed using the standard protocol following bolus administration of intravenous contrast. CONTRAST:  OMNIPAQUE IOHEXOL 300 MG/ML  SOLN COMPARISON:  CT of the abdomen pelvis dated 05/23/2018 FINDINGS: Lower chest: Bibasilar dependent atelectatic changes noted. There is a 4 mm left lower lobe subpleural nodule. No definite intra-abdominal free air. No free fluid. Hepatobiliary: The liver is unremarkable. The gallbladder is decompressed around a percutaneous cholecystostomy tube. Pancreas: Unremarkable. No pancreatic ductal dilatation or surrounding inflammatory changes. Spleen: Normal in size without focal abnormality. Adrenals/Urinary Tract: The adrenal glands are unremarkable.  Multiple large and staghorn left renal calculi noted measuring up to 17 mm in the interpolar aspect of the kidney. There is a 16 mm stone or cluster of stones in the left renal pelvis with partial obstruction of the UPJ and resulting mild left hydronephrosis. There is no hydronephrosis on the right. The visualized ureters appear unremarkable. There are several stones within the urinary bladder. There is diffuse trabeculation of the bladder wall likely related to chronic bladder outlet obstruction or chronic inflammation. Stomach/Bowel: The stomach is distended with air and ingested content. There is retained ingested material within the visualized esophagus which may represent reflux or delayed esophageal clearance and  dysmotility. There is mild thickening of the gastroesophageal junction which may be related to chronic and recurrent reflux. There are multiple dilated loops of small bowel with air-fluid levels measuring up to 7.5 cm in diameter. There is a probable transition in the mid abdomen (series 2, image 60 and coronal series 5, image 51). There is large amount of stool throughout the colon. Vascular/Lymphatic: There is gas within mesenteric vessels (series 2, image 49 and 47 and 50) concerning for bowel ischemia. Clinical correlation and surgical consult is advised. No gas is noted within the portal veins. The abdominal aorta is unremarkable. The origins of the celiac axis, SMA, IMA are patent. The SMV, splenic vein, and main portal vein are patent. There is no adenopathy. Reproductive: Multiple fiducial markers noted within the prostate gland. Other: None Musculoskeletal: Degenerative changes of the spine. No acute osseous pathology. IMPRESSION: 1. High-grade small bowel obstruction with pneumatosis of the mesenteric vessels concerning for bowel ischemia. Clinical correlation and surgical consult is advised. 2. Distended stomach with ingested content in the distal esophagus. 3. Multiple nonobstructing left renal calculi. A partially obstructing stone or string of adjacent stones in the left UPJ with associated mild left hydronephrosis. Multiple bladder calculi. 4. Percutaneous cholecystostomy tube with decompression of the gallbladder. These results were called by telephone at the time of interpretation on 08/25/2018 at 9:58 pm to Dr. Raeford Razor , who verbally acknowledged these results. Electronically Signed   By: Elgie Collard M.D.   On: 08/25/2018 22:00   Dg Chest Port 1 View  Result Date: 08/25/2018 CLINICAL DATA:  Preoperative imaging EXAM: PORTABLE CHEST 1 VIEW COMPARISON:  None. FINDINGS: Shallow lung inflation with bibasilar atelectasis. No focal airspace consolidation. Cardiomediastinal contours are  normal. IMPRESSION: No acute airspace disease. Electronically Signed   By: Deatra Robinson M.D.   On: 08/25/2018 23:09    EKG: Independently reviewed. Sinus rhythm, LVH, QTc 486 ms.   Assessment/Plan   1. SBO, high-grade  - Presents with abdominal pain, distension, and N/V  - CT findings suggest high-grade SBO with pneumatosis of mesenteric vessels concerning for ischemia  - Surgery is consulting and much appreciated, evaluating patient for possible emergent surgery  - Continue bowel-rest, decompression with NGT, empiric antibiotic, pain-control, IVF hydration    2. Type II DM  - A1c was 8.0% in December, managed with metformin at home  - Check CBG's and use a SSI with Novolog while in hospital    3. Cholecystitis  - Percutaneous tube in place with scant bilious drainage  - Continue drain care     PPE: Mask, face shield  DVT prophylaxis: SCD's  Code Status: Full  Family Communication: Caretaker updated at bedside Consults called: Surgery  Admission status: Inpatient     Briscoe Deutscher, MD [facility] Pager [phone]  If 7PM-7AM, please contact night-coverage [email or web address]  Password [email or web address]  08/25/2018, 11:44 PM

## 2018-08-25 NOTE — Progress Notes (Signed)
Pharmacy Antibiotic Note  [name] is a 68 y.o. male admitted on 08/25/2018 with abdominal pain and nausea/ vomiting, CT with largly dilated SB and concern for pneumatosis.   Pharmacy has been consulted for Zosyn dosing.  Plan: Zosyn 3.375 once over then will dose zosyn 3.375mg  IV q8hr (extended infusion over 4 hrs) beginning at 0600 on 5/12.  Height: 5\' 7"  (170.2 cm) Weight: 150 lb 8 oz (68.3 kg) IBW/kg (Calculated) : 66.1  No data recorded.  Recent Labs  Lab 08/25/18 2020 08/25/18 2209  WBC 11.4*  --   CREATININE 1.16  --   LATICACIDVEN  --  1.7    Estimated Creatinine Clearance: 57 mL/min (by C-G formula based on SCr of 1.16 mg/dL).    No Known Allergies  Antimicrobials this admission: N/A  Dose adjustments this admission: N/A   Thank you for allowing pharmacy to be a part of this patient's care.  Sherrilyn Rist 08/25/2018 11:51 PM

## 2018-08-25 NOTE — ED Triage Notes (Signed)
Pt presents to ED with complaints of abdominal pain which started this evening. Pt's guardian states he would not eat and has been c/o generalized abdominal pain and nausea. She also states he has not had a BM in 4 days.

## 2018-08-25 NOTE — Anesthesia Preprocedure Evaluation (Signed)
Anesthesia Evaluation  Patient identified by MRN, date of birth, ID band Patient awake    Reviewed: Allergy & Precautions, H&P , NPO status , Patient's Chart, lab work & pertinent test results  Airway Mallampati: II  TM Distance: >3 FB Neck ROM: full    Dental no notable dental hx.    Pulmonary neg pulmonary ROS,    Pulmonary exam normal breath sounds clear to auscultation       Cardiovascular Exercise Tolerance: Good hypertension, negative cardio ROS   Rhythm:regular Rate:Normal     Neuro/Psych PSYCHIATRIC DISORDERS Intellectual disabilitynegative neurological ROS     GI/Hepatic negative GI ROS, Neg liver ROS, SBO   Endo/Other  negative endocrine ROSdiabetes  Renal/GU Renal disease  negative genitourinary   Musculoskeletal   Abdominal   Peds  Hematology negative hematology ROS (+)   Anesthesia Other Findings   Reproductive/Obstetrics negative OB ROS                             Anesthesia Physical Anesthesia Plan  ASA: III and emergent  Anesthesia Plan: General   Post-op Pain Management:    Induction:   PONV Risk Score and Plan:   Airway Management Planned:   Additional Equipment:   Intra-op Plan:   Post-operative Plan:   Informed Consent: I have reviewed the patients History and Physical, chart, labs and discussed the procedure including the risks, benefits and alternatives for the proposed anesthesia with the patient or authorized representative who has indicated his/her understanding and acceptance.       Plan Discussed with: CRNA  Anesthesia Plan Comments:         Anesthesia Quick Evaluation

## 2018-08-25 NOTE — ED Notes (Signed)
Emptied pt's gallbladder drain. It had 2cc of purulent drainage.

## 2018-08-25 NOTE — Progress Notes (Signed)
[facility]  Patient coming in with abdominal pain and nausea/ vomiting. His CT with largely dilated SB, upwards  7.5cm+ and concern for pneumatosis.  COVID test pending but patient will need emergency surgery.   CXR ordered preop  EKG ordered Will plan for exploration. Will get hospitalist to be the admitting service given history and nonverbal state. Ng would be best given debride in the stomach.  Will get consent from guardian once I arrive.  Algis Greenhouse, MD [facility] [address] [place], [place] [address] [phone] (office)

## 2018-08-25 NOTE — ED Provider Notes (Signed)
[facility] EMERGENCY DEPARTMENT Provider Note   CSN: [id number] Arrival date & time: 08/25/18  1938    History   Chief Complaint Chief Complaint  Patient presents with  . Abdominal Pain    HPI [name] is a 68 y.o. male.     HPI   68 year old male with abdominal pain, distention and vomiting.  Patient is mentally challenged and history is from guardian at bedside. He is nonverbal. She noticed increasing distention and not wanting to eat earlier today.  Retching at times.  Last bowel movement that she is aware of was 4 days ago.  Cholecystostomy tube placed a few months ago.  She reports that the drainage has been steady and without acute change.  No recorded fevers.  Past Medical History:  Diagnosis Date  . Diabetes mellitus without complication (HCC)   . History of gout   . History of kidney stones   . Hypertension   . Mentally challenged   . Urinary retention     Patient Active Problem List   Diagnosis Date Noted  . Acute gangrenous cholecystitis 05/24/2018  . SBO (small bowel obstruction) (HCC) 05/24/2018  . Abdominal distention   . Pressure injury of skin 04/09/2018  . C. difficile diarrhea 04/08/2018  . Bilateral hydronephrosis 04/08/2018  . Pneumoperitoneum of unknown etiology   . Diarrhea 04/07/2018  . Nonverbal 04/07/2018  . Hydronephrosis with urinary obstruction due to ureteral calculus 06/20/2016  . Sepsis secondary to UTI (HCC) 06/20/2016  . Fever 06/19/2016  . Generalized weakness 06/19/2016  . Diabetes mellitus type 2, controlled (HCC) 06/19/2016  . Severe sepsis with septic shock (HCC) 06/19/2016  . UTI (urinary tract infection) 06/19/2016  . Hematuria 06/19/2016  . AKI (acute kidney injury) (HCC) 06/19/2016  . Hyponatremia 06/19/2016  . Hypokalemia 06/19/2016  . BPH (benign prostatic hyperplasia) 06/19/2016  . 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 Urinary retention 05/07/2014  . Intellectual disability 05/07/2014  .  Hyperlipidemia 05/07/2014  . HTN (hypertension) 05/07/2014    Past Surgical History:  Procedure Laterality Date  . CYSTOSCOPY W/ URETERAL STENT PLACEMENT Bilateral 06/20/2016   Procedure: CYSTOSCOPY WITH RETROGRADE PYELOGRAM/URETERAL STENT PLACEMENT;  Surgeon: Malen Gauze, MD;  Location: [facility];  Service: Urology;  Laterality: Bilateral;  . CYSTOSCOPY WITH RETROGRADE PYELOGRAM, URETEROSCOPY AND STENT PLACEMENT Bilateral 08/20/2016   Procedure: CYSTOSCOPY WITH BILATERAL RETROGRADE PYELOGRAM, BILATERAL URETEROSCOPY,  LASER LITHOTRIPSY OF RIGHT URETERAL CALCULI, STONE BASKET EXTRACTION LEFT URETERAL CALCULI  AND BILATERAL URETERAL STENT EXCHANGE;  Surgeon: Malen Gauze, MD;  Location: [facility];  Service: Urology;  Laterality: Bilateral;  . HOLMIUM LASER APPLICATION Bilateral 08/20/2016   Procedure: HOLMIUM LASER APPLICATION;  Surgeon: Malen Gauze, MD;  Location: [facility];  Service: Urology;  Laterality: Bilateral;  . IR CATHETER TUBE CHANGE  07/04/2018  . IR EXCHANGE BILIARY DRAIN  06/09/2018  . IR PERC CHOLECYSTOSTOMY  05/24/2018        Home Medications    Prior to Admission medications   Medication Sig Start Date End Date Taking? Authorizing Provider  Acetaminophen (TYLENOL ARTHRITIS PAIN PO) Take 1 tablet by mouth every 6 (six) hours as needed (pain).   Yes [provider]  aspirin EC 81 MG tablet Take 81 mg by mouth every morning.    Yes [provider]  feeding supplement, GLUCERNA SHAKE, (GLUCERNA SHAKE) LIQD Take 237 mLs by mouth 3 (three) times daily between meals. 05/28/18  Yes Ghimire, Werner Lean, MD  metFORMIN (GLUCOPHAGE) 500 MG tablet Take 1,000  mg by mouth every morning. 02/03/18  Yes [provider]  tamsulosin (FLOMAX) 0.4 MG CAPS capsule Take 1 capsule (0.4 mg total) by mouth every evening. 04/12/18  Yes Erick Blinks, MD  ciprofloxacin (CIPRO) 500 MG tablet Take 500 mg by mouth 2 (two) times a day. 7 day course starting on  08/13/2018--COMPLETED 08/13/18   [provider]    Family History Family History  Problem Relation Age of Onset  . Gallbladder disease Other     Social History Social History   Tobacco Use  . Smoking status: Never Smoker  . Smokeless tobacco: Never Used  Substance Use Topics  . Alcohol use: No  . Drug use: No     Allergies   Patient has no known allergies.   Review of Systems Review of Systems  All systems reviewed and negative, other than as noted in HPI.  Physical Exam Updated Vital Signs BP (!) 141/83 (BP Location: Right Arm)   Pulse 87   Resp 17   Ht 5\' 7"  (1.702 m)   Wt 68.3 kg   SpO2 98%   BMI 23.57 kg/m   Physical Exam Vitals signs and nursing note reviewed.  Constitutional:      General: He is not in acute distress.    Appearance: He is well-developed.  HENT:     Head: Normocephalic and atraumatic.  Eyes:     General:        Right eye: No discharge.        Left eye: No discharge.     Conjunctiva/sclera: Conjunctivae normal.  Neck:     Musculoskeletal: Neck supple.  Cardiovascular:     Rate and Rhythm: Normal rate and regular rhythm.     Heart sounds: Normal heart sounds. No murmur. No friction rub. No gallop.   Pulmonary:     Effort: Pulmonary effort is normal. No respiratory distress.     Breath sounds: Normal breath sounds.  Abdominal:     General: There is distension.     Palpations: Abdomen is soft.     Tenderness: There is no abdominal tenderness.     Comments: Massive distension. Tympany. No obvious reaction to palpation. Cholecystostomy tube with bilious drainage.   Musculoskeletal:        General: No tenderness.  Skin:    General: Skin is warm and dry.  Neurological:     Mental Status: He is alert.      ED Treatments / Results  Labs (all labs ordered are listed, but only abnormal results are displayed) Labs Reviewed  COMPREHENSIVE METABOLIC PANEL - Abnormal; Notable for the following components:      Result Value    Glucose, Bld 164 (*)    BUN 30 (*)    Total Protein 9.6 (*)    All other components within normal limits  CBC - Abnormal; Notable for the following components:   WBC 11.4 (*)    All other components within normal limits  SARS CORONAVIRUS 2 (HOSPITAL ORDER, PERFORMED IN [facility])  LIPASE, BLOOD  LACTIC ACID, PLASMA  URINALYSIS, ROUTINE W REFLEX MICROSCOPIC    EKG None  Radiology Ct Abdomen Pelvis W Contrast  Result Date: 08/25/2018 CLINICAL DATA:  68 year old male with abdominal distension and pain and nausea. EXAM: CT ABDOMEN AND PELVIS WITH CONTRAST TECHNIQUE: Multidetector CT imaging of the abdomen and pelvis was performed using the standard protocol following bolus administration of intravenous contrast. CONTRAST:  OMNIPAQUE IOHEXOL 300 MG/ML  SOLN COMPARISON:  CT of the abdomen pelvis dated 05/23/2018 FINDINGS: Lower chest: Bibasilar dependent atelectatic changes noted. There is a 4 mm left lower lobe subpleural nodule. No definite intra-abdominal free air. No free fluid. Hepatobiliary: The liver is unremarkable. The gallbladder is decompressed around a percutaneous cholecystostomy tube. Pancreas: Unremarkable. No pancreatic ductal dilatation or surrounding inflammatory changes. Spleen: Normal in size without focal abnormality. Adrenals/Urinary Tract: The adrenal glands are unremarkable. Multiple large and staghorn left renal calculi noted measuring up to 17 mm in the interpolar aspect of the kidney. There is a 16 mm stone or cluster of stones in the left renal pelvis with partial obstruction of the UPJ and resulting mild left hydronephrosis. There is no hydronephrosis on the right. The visualized ureters appear unremarkable. There are several stones within the urinary bladder. There is diffuse trabeculation of the bladder wall likely related to chronic bladder outlet obstruction or chronic inflammation. Stomach/Bowel: The stomach is distended with air and ingested  content. There is retained ingested material within the visualized esophagus which may represent reflux or delayed esophageal clearance and dysmotility. There is mild thickening of the gastroesophageal junction which may be related to chronic and recurrent reflux. There are multiple dilated loops of small bowel with air-fluid levels measuring up to 7.5 cm in diameter. There is a probable transition in the mid abdomen (series 2, image 60 and coronal series 5, image 51). There is large amount of stool throughout the colon. Vascular/Lymphatic: There is gas within mesenteric vessels (series 2, image 49 and 47 and 50) concerning for bowel ischemia. Clinical correlation and surgical consult is advised. No gas is noted within the portal veins. The abdominal aorta is unremarkable. The origins of the celiac axis, SMA, IMA are patent. The SMV, splenic vein, and main portal vein are patent. There is no adenopathy. Reproductive: Multiple fiducial markers noted within the prostate gland. Other: None Musculoskeletal: Degenerative changes of the spine. No acute osseous pathology. IMPRESSION: 1. High-grade small bowel obstruction with pneumatosis of the mesenteric vessels concerning for bowel ischemia. Clinical correlation and surgical consult is advised. 2. Distended stomach with ingested content in the distal esophagus. 3. Multiple nonobstructing left renal calculi. A partially obstructing stone or string of adjacent stones in the left UPJ with associated mild left hydronephrosis. Multiple bladder calculi. 4. Percutaneous cholecystostomy tube with decompression of the gallbladder. These results were called by telephone at the time of interpretation on 08/25/2018 at 9:58 pm to Dr. Raeford Razor , who verbally acknowledged these results. Electronically Signed   By: Elgie Collard M.D.   On: 08/25/2018 22:00    Procedures Procedures (including critical care time)  CRITICAL CARE Performed by: Raeford Razor Total critical  care time: 35  minutes Critical care time was exclusive of separately billable procedures and treating other patients. Critical care was necessary to treat or prevent imminent or life-threatening deterioration. Critical care was time spent personally by me on the following activities: development of treatment plan with patient and/or surrogate as well as nursing, discussions with consultants, evaluation of patient's response to treatment, examination of patient, obtaining history from patient or surrogate, ordering and performing treatments and interventions, ordering and review of laboratory studies, ordering and review of radiographic studies, pulse oximetry and re-evaluation of patient's condition.   Medications Ordered in ED Medications  sodium chloride flush (NS) 0.9 % injection 3 mL (has no administration in time range)  0.9 %  sodium chloride infusion ( Intravenous New Bag/Given 08/25/18 2245)  ondansetron (ZOFRAN) injection 4 mg (4  mg Intravenous Given 08/25/18 2035)  iohexol (OMNIPAQUE) 300 MG/ML solution 100 mL (100 mLs Intravenous Contrast Given 08/25/18 2126)  fentaNYL (SUBLIMAZE) injection 50 mcg (50 mcg Intravenous Given 08/25/18 2245)     Initial Impression / Assessment and Plan / ED Course  I have reviewed the triage vital signs and the nursing notes.  Pertinent labs & imaging results that were available during my care of the patient were reviewed by me and considered in my medical decision making (see chart for details).     68 year old male with high-grade bowel obstruction and concern for ischemia.  Discussed case with Dr. [name], general surgery.  Patient is to go emergently to the OR.   Patient is mentally challenged and cannot explain the rationale for NG tube placement and was very hard to try and reassure him. Attempted to place NG tube but patient pulling it out.   Final Clinical Impressions(s) / ED Diagnoses   Final diagnoses:  SBO (small bowel obstruction) (HCC)   Small bowel ischemia [facility](HCC)    ED Discharge Orders    None       Raeford RazorKohut, Jaxsyn Catalfamo, MD 08/25/18 2259

## 2018-08-25 NOTE — ED Notes (Signed)
Pt pulled ng tube out of nose as I was trying advance. Per guardian pt will not tolerate ng tube.

## 2018-08-25 NOTE — ED Notes (Signed)
Attempted ng tube for second time, pt pulled tube out while trying to advance.

## 2018-08-26 ENCOUNTER — Encounter (HOSPITAL_COMMUNITY): Payer: Self-pay

## 2018-08-26 ENCOUNTER — Inpatient Hospital Stay (HOSPITAL_COMMUNITY): Payer: Medicare Other | Admitting: Anesthesiology

## 2018-08-26 DIAGNOSIS — K56609 Unspecified intestinal obstruction, unspecified as to partial versus complete obstruction: Secondary | ICD-10-CM | POA: Diagnosis not present

## 2018-08-26 DIAGNOSIS — K6389 Other specified diseases of intestine: Secondary | ICD-10-CM

## 2018-08-26 DIAGNOSIS — K913 Postprocedural intestinal obstruction, unspecified as to partial versus complete: Secondary | ICD-10-CM | POA: Diagnosis not present

## 2018-08-26 DIAGNOSIS — N179 Acute kidney failure, unspecified: Secondary | ICD-10-CM | POA: Diagnosis not present

## 2018-08-26 DIAGNOSIS — Z1159 Encounter for screening for other viral diseases: Secondary | ICD-10-CM | POA: Diagnosis not present

## 2018-08-26 DIAGNOSIS — K9189 Other postprocedural complications and disorders of digestive system: Secondary | ICD-10-CM | POA: Diagnosis not present

## 2018-08-26 LAB — COMPREHENSIVE METABOLIC PANEL
ALT: 10 U/L (ref 0–44)
AST: 16 U/L (ref 15–41)
Albumin: 3.6 g/dL (ref 3.5–5.0)
Alkaline Phosphatase: 68 U/L (ref 38–126)
Anion gap: 11 (ref 5–15)
BUN: 34 mg/dL — ABNORMAL HIGH (ref 8–23)
CO2: 22 mmol/L (ref 22–32)
Calcium: 9.2 mg/dL (ref 8.9–10.3)
Chloride: 106 mmol/L (ref 98–111)
Creatinine, Ser: 1.4 mg/dL — ABNORMAL HIGH (ref 0.61–1.24)
GFR calc Af Amer: 59 mL/min — ABNORMAL LOW (ref >60)
GFR calc non Af Amer: 51 mL/min — ABNORMAL LOW (ref >60)
Glucose, Bld: 194 mg/dL — ABNORMAL HIGH (ref 70–99)
Potassium: 4.5 mmol/L (ref 3.5–5.1)
Sodium: 139 mmol/L (ref 135–145)
Total Bilirubin: 0.2 mg/dL — ABNORMAL LOW (ref 0.3–1.2)
Total Protein: 8 g/dL (ref 6.5–8.1)

## 2018-08-26 LAB — CBC WITH DIFFERENTIAL/PLATELET
Abs Immature Granulocytes: 0.06 10*3/uL (ref 0.00–0.07)
Basophils Absolute: 0 10*3/uL (ref 0.0–0.1)
Basophils Relative: 0 %
Eosinophils Absolute: 0 10*3/uL (ref 0.0–0.5)
Eosinophils Relative: 0 %
HCT: 48.8 % (ref 39.0–52.0)
Hemoglobin: 15.1 g/dL (ref 13.0–17.0)
Immature Granulocytes: 0 %
Lymphocytes Relative: 5 %
Lymphs Abs: 0.8 10*3/uL (ref 0.7–4.0)
MCH: 29.2 pg (ref 26.0–34.0)
MCHC: 30.9 g/dL (ref 30.0–36.0)
MCV: 94.4 fL (ref 80.0–100.0)
Monocytes Absolute: 0.4 10*3/uL (ref 0.1–1.0)
Monocytes Relative: 2 %
Neutro Abs: 15 10*3/uL — ABNORMAL HIGH (ref 1.7–7.7)
Neutrophils Relative %: 93 %
Platelets: 233 10*3/uL (ref 150–400)
RBC: 5.17 MIL/uL (ref 4.22–5.81)
RDW: 12.7 % (ref 11.5–15.5)
WBC: 16.2 10*3/uL — ABNORMAL HIGH (ref 4.0–10.5)
nRBC: 0 % (ref 0.0–0.2)

## 2018-08-26 LAB — URINALYSIS, ROUTINE W REFLEX MICROSCOPIC
Bilirubin Urine: NEGATIVE
Glucose, UA: NEGATIVE mg/dL
Ketones, ur: NEGATIVE mg/dL
Nitrite: NEGATIVE
Protein, ur: 30 mg/dL — AB
Specific Gravity, Urine: 1.033 — ABNORMAL HIGH (ref 1.005–1.030)
WBC, UA: >50 WBC/hpf — ABNORMAL HIGH (ref 0–5)
pH: 5 (ref 5.0–8.0)

## 2018-08-26 LAB — GLUCOSE, CAPILLARY
Glucose-Capillary: 121 mg/dL — ABNORMAL HIGH (ref 70–99)
Glucose-Capillary: 122 mg/dL — ABNORMAL HIGH (ref 70–99)
Glucose-Capillary: 144 mg/dL — ABNORMAL HIGH (ref 70–99)
Glucose-Capillary: 164 mg/dL — ABNORMAL HIGH (ref 70–99)
Glucose-Capillary: 165 mg/dL — ABNORMAL HIGH (ref 70–99)
Glucose-Capillary: 190 mg/dL — ABNORMAL HIGH (ref 70–99)
Glucose-Capillary: 97 mg/dL (ref 70–99)

## 2018-08-26 LAB — MRSA PCR SCREENING: MRSA by PCR: NEGATIVE

## 2018-08-26 MED ORDER — SODIUM CHLORIDE 0.9 % IR SOLN
Status: DC | PRN
  Administered 2018-08-26 (×3): 1000 mL

## 2018-08-26 MED ORDER — NEOSTIGMINE METHYLSULFATE 10 MG/10ML IV SOLN
INTRAVENOUS | Status: DC | PRN
  Administered 2018-08-26: 3 mg via INTRAVENOUS

## 2018-08-26 MED ORDER — DEXAMETHASONE SODIUM PHOSPHATE 4 MG/ML IJ SOLN
INTRAMUSCULAR | Status: DC | PRN
  Administered 2018-08-26: 5 mg via INTRAVENOUS

## 2018-08-26 MED ORDER — GLYCOPYRROLATE 0.2 MG/ML IJ SOLN
INTRAMUSCULAR | Status: DC | PRN
  Administered 2018-08-26: 0.3 mg via INTRAVENOUS

## 2018-08-26 MED ORDER — KETAMINE HCL 10 MG/ML IJ SOLN
INTRAMUSCULAR | Status: DC | PRN
  Administered 2018-08-26: 20 mg via INTRAVENOUS
  Administered 2018-08-26: 30 mg via INTRAVENOUS

## 2018-08-26 MED ORDER — HYDROCODONE-ACETAMINOPHEN 7.5-325 MG PO TABS: 1.0000 | ORAL_TABLET | Freq: Once | ORAL | Status: DC | PRN

## 2018-08-26 MED ORDER — BUPIVACAINE LIPOSOME 1.3 % IJ SUSP
INTRAMUSCULAR | Status: DC | PRN
  Administered 2018-08-26: 20 mL

## 2018-08-26 MED ORDER — LACTATED RINGERS IV SOLN
INTRAVENOUS | Status: DC
  Administered 2018-08-26 – 2018-08-30 (×11): via INTRAVENOUS
  Administered 2018-08-30: 02:00:00 125 mL/h via INTRAVENOUS
  Administered 2018-08-30 – 2018-09-02 (×7): via INTRAVENOUS

## 2018-08-26 MED ORDER — LACTATED RINGERS IV SOLN
INTRAVENOUS | Status: DC
  Administered 2018-08-26: 01:00:00 via INTRAVENOUS

## 2018-08-26 MED ORDER — ROCURONIUM BROMIDE 100 MG/10ML IV SOLN
INTRAVENOUS | Status: DC | PRN
  Administered 2018-08-26: 30 mg via INTRAVENOUS

## 2018-08-26 MED ORDER — KETOROLAC TROMETHAMINE 30 MG/ML IJ SOLN
30.0000 mg | Freq: Once | INTRAMUSCULAR | Status: AC | PRN
  Administered 2018-08-26: 30 mg via INTRAVENOUS
  Filled 2018-08-26: qty 1

## 2018-08-26 MED ORDER — MEPERIDINE HCL 50 MG/ML IJ SOLN: 6.2500 mg | INTRAMUSCULAR | Status: DC | PRN

## 2018-08-26 MED ORDER — PHENYLEPHRINE HCL (PRESSORS) 10 MG/ML IV SOLN
INTRAVENOUS | Status: DC | PRN
  Administered 2018-08-26 (×5): 200 ug via INTRAVENOUS

## 2018-08-26 MED ORDER — SODIUM CHLORIDE 0.9 % IV SOLN
INTRAVENOUS | Status: AC
  Filled 2018-08-26: qty 2

## 2018-08-26 MED ORDER — KETOROLAC TROMETHAMINE 30 MG/ML IJ SOLN: 30.0000 mg | Freq: Four times a day (QID) | INTRAMUSCULAR | Status: DC

## 2018-08-26 MED ORDER — SODIUM CHLORIDE 0.9 % IV SOLN
2.0000 g | Freq: Two times a day (BID) | INTRAVENOUS | Status: AC
  Administered 2018-08-26 – 2018-08-27 (×3): 2 g via INTRAVENOUS
  Filled 2018-08-26 (×3): qty 2

## 2018-08-26 MED ORDER — LACTATED RINGERS IV SOLN
INTRAVENOUS | Status: DC | PRN
  Administered 2018-08-26: 01:00:00 via INTRAVENOUS

## 2018-08-26 MED ORDER — HYDROMORPHONE HCL 1 MG/ML IJ SOLN
0.2500 mg | INTRAMUSCULAR | Status: DC | PRN
  Administered 2018-08-26: 0.5 mg via INTRAVENOUS
  Filled 2018-08-26: qty 0.5

## 2018-08-26 MED ORDER — ONDANSETRON HCL 4 MG/2ML IJ SOLN: 4.0000 mg | Freq: Once | INTRAMUSCULAR | Status: DC | PRN

## 2018-08-26 MED ORDER — LIDOCAINE HCL (CARDIAC) PF 100 MG/5ML IV SOSY
PREFILLED_SYRINGE | INTRAVENOUS | Status: DC | PRN
  Administered 2018-08-26: 100 mg via INTRAVENOUS

## 2018-08-26 MED ORDER — KETAMINE HCL 50 MG/5ML IJ SOSY
PREFILLED_SYRINGE | INTRAMUSCULAR | Status: AC
  Filled 2018-08-26: qty 5

## 2018-08-26 MED ORDER — PROPOFOL 10 MG/ML IV BOLUS
INTRAVENOUS | Status: DC | PRN
  Administered 2018-08-26: 150 mg via INTRAVENOUS

## 2018-08-26 MED ORDER — SODIUM CHLORIDE 0.9 % IV SOLN
INTRAVENOUS | Status: DC | PRN
  Administered 2018-08-26: 2 g via INTRAVENOUS

## 2018-08-26 MED ORDER — SUCCINYLCHOLINE CHLORIDE 20 MG/ML IJ SOLN
INTRAMUSCULAR | Status: DC | PRN
  Administered 2018-08-26: 120 mg via INTRAVENOUS

## 2018-08-26 NOTE — Transfer of Care (Signed)
Immediate Anesthesia Transfer of Care Note  Patient: AILTON BELVILLE  Procedure(s) Performed: EXPLORATORY LAPAROTOMY reduction of volvulus (N/A )  Patient Location: PACU  Anesthesia Type:General  Level of Consciousness: drowsy, patient cooperative and responds to stimulation  Airway & Oxygen Therapy: Patient Spontanous Breathing  Post-op Assessment: Report given to RN and Post -op Vital signs reviewed and stable  Post vital signs: Reviewed  Last Vitals:  Vitals Value Taken Time  BP 136/66 08/26/2018  1:55 AM  Temp    Pulse 66 08/26/2018  1:59 AM  Resp 14 08/26/2018  1:59 AM  SpO2 93 % 08/26/2018  1:59 AM  Vitals shown include unvalidated device data.  Last Pain: There were no vitals filed for this visit.       Complications: No apparent anesthesia complications

## 2018-08-26 NOTE — Progress Notes (Signed)
[facility] Progress Note  1 Day Post-Op  Subjective: Looking good this AM. No issues reported by his guardian. Having some pain.  NG in place.   Objective: Vital signs in last 24 hours: Temp:  [97.4 F (36.3 C)] 97.4 F (36.3 C) (05/12 0805) Pulse Rate:  [63-94] 77 (05/12 1000) Resp:  [9-18] 13 (05/12 1000) BP: (100-141)/(55-85) 109/55 (05/12 1000) SpO2:  [92 %-100 %] 99 % (05/12 1000) Weight:  [68.3 kg-72.2 kg] 72.2 kg (05/12 0257) Last BM Date: 08/21/18  Intake/Output from previous day: 05/11 0701 - 05/12 0700 In: 2240.8 [I.V.:2240.8] Out: 4080 [Urine:750; Emesis/NG output:300; Blood:30] Intake/Output this shift: Total I/O In: 211.1 [I.V.:211.1] Out: 20 [Emesis/NG output:20]  General appearance: alert, cooperative, no distress and at baseline Resp: normal work of breathing GI: soft, distended, appropriately tender, abdominal binder in place, minimal staining on the dressing  Lab Results:  Recent Labs    08/25/18 2020 08/26/18 0400  WBC 11.4* 16.2*  HGB 15.3 15.1  HCT 49.4 48.8  PLT 243 233   BMET Recent Labs    08/25/18 2020 08/26/18 0400  NA 136 139  K 4.2 4.5  CL 101 106  CO2 23 22  GLUCOSE 164* 194*  BUN 30* 34*  CREATININE 1.16 1.40*  CALCIUM 10.2 9.2    Studies/Results: Ct Abdomen Pelvis W Contrast  Result Date: 08/25/2018 CLINICAL DATA:  68 year old male with abdominal distension and pain and nausea. EXAM: CT ABDOMEN AND PELVIS WITH CONTRAST TECHNIQUE: Multidetector CT imaging of the abdomen and pelvis was performed using the standard protocol following bolus administration of intravenous contrast. CONTRAST:  OMNIPAQUE IOHEXOL 300 MG/ML  SOLN COMPARISON:  CT of the abdomen pelvis dated 05/23/2018 FINDINGS: Lower chest: Bibasilar dependent atelectatic changes noted. There is a 4 mm left lower lobe subpleural nodule. No definite intra-abdominal free air. No free fluid. Hepatobiliary: The liver is unremarkable. The gallbladder  is decompressed around a percutaneous cholecystostomy tube. Pancreas: Unremarkable. No pancreatic ductal dilatation or surrounding inflammatory changes. Spleen: Normal in size without focal abnormality. Adrenals/Urinary Tract: The adrenal glands are unremarkable. Multiple large and staghorn left renal calculi noted measuring up to 17 mm in the interpolar aspect of the kidney. There is a 16 mm stone or cluster of stones in the left renal pelvis with partial obstruction of the UPJ and resulting mild left hydronephrosis. There is no hydronephrosis on the right. The visualized ureters appear unremarkable. There are several stones within the urinary bladder. There is diffuse trabeculation of the bladder wall likely related to chronic bladder outlet obstruction or chronic inflammation. Stomach/Bowel: The stomach is distended with air and ingested content. There is retained ingested material within the visualized esophagus which may represent reflux or delayed esophageal clearance and dysmotility. There is mild thickening of the gastroesophageal junction which may be related to chronic and recurrent reflux. There are multiple dilated loops of small bowel with air-fluid levels measuring up to 7.5 cm in diameter. There is a probable transition in the mid abdomen (series 2, image 60 and coronal series 5, image 51). There is large amount of stool throughout the colon. Vascular/Lymphatic: There is gas within mesenteric vessels (series 2, image 49 and 47 and 50) concerning for bowel ischemia. Clinical correlation and surgical consult is advised. No gas is noted within the portal veins. The abdominal aorta is unremarkable. The origins of the celiac axis, SMA, IMA are patent. The SMV, splenic vein, and main portal vein are patent. There is no adenopathy. Reproductive: Multiple fiducial  markers noted within the prostate gland. Other: None Musculoskeletal: Degenerative changes of the spine. No acute osseous pathology. IMPRESSION: 1.  High-grade small bowel obstruction with pneumatosis of the mesenteric vessels concerning for bowel ischemia. Clinical correlation and surgical consult is advised. 2. Distended stomach with ingested content in the distal esophagus. 3. Multiple nonobstructing left renal calculi. A partially obstructing stone or string of adjacent stones in the left UPJ with associated mild left hydronephrosis. Multiple bladder calculi. 4. Percutaneous cholecystostomy tube with decompression of the gallbladder. These results were called by telephone at the time of interpretation on 08/25/2018 at 9:58 pm to Dr. Raeford RazorSTEPHEN KOHUT , who verbally acknowledged these results. Electronically Signed   By: Elgie CollardArash  Radparvar M.D.   On: 08/25/2018 22:00   Dg Chest Port 1 View  Result Date: 08/25/2018 CLINICAL DATA:  Preoperative imaging EXAM: PORTABLE CHEST 1 VIEW COMPARISON:  None. FINDINGS: Shallow lung inflation with bibasilar atelectasis. No focal airspace consolidation. Cardiomediastinal contours are normal. IMPRESSION: No acute airspace disease. Electronically Signed   By: Deatra RobinsonKevin  Herman M.D.   On: 08/25/2018 23:09    Anti-infectives: Anti-infectives (From admission, onward)   Start     Dose/Rate Route Frequency Ordered Stop   08/26/18 1100  cefoTEtan (CEFOTAN) 2 g in sodium chloride 0.9 % 100 mL IVPB     2 g 200 mL/hr over 30 Minutes Intravenous Every 12 hours 08/26/18 0301 08/27/18 2159   08/26/18 0600  piperacillin-tazobactam (ZOSYN) IVPB 3.375 g  Status:  Discontinued     3.375 g 12.5 mL/hr over 240 Minutes Intravenous Every 8 hours 08/25/18 2347 08/26/18 0301   08/26/18 0000  piperacillin-tazobactam (ZOSYN) IVPB 3.375 g  Status:  Discontinued     3.375 g 100 mL/hr over 30 Minutes Intravenous  Once 08/25/18 2347 08/26/18 0301      Assessment/Plan: [name] is a 68 yo POD 1 s/p Ex lap, reduction of volvulus for a high grade small bowel obstruction. No signs of ischemic bowel on exploration. Will definitely have an ileus  due to the manipulation and handling of the bowel.   -PRN For pain fentanyl ordered -IS, Would get to chair today at least -NPO, NG in place, will await bowel function -Cholecystomy tube in place and will remain in place  -Keep binder in place -Leukocytosis post op, monitor, no fevers, cefotetan for 3 dose post surgery, no ischemic bowel or spillage of contents -AKI noted, Foley in place, Replace lytes with goal K > 4, Mg > 2 and Phos > 3, IVF @ 125  -SCDs, can add prophylaxis  -Mittens due to patient pulling at things and pulling out Foleys in the past  -Will continue to monitor, appreciate hospitalist assistance with patient   Discussed with Dr. Sherryll BurgerShah.   LOS: 1 day    Levi RoersLindsay C [name] 08/26/2018

## 2018-08-26 NOTE — Progress Notes (Signed)
Pts caregiver states she has to catheterize him TID d/t pt retaining urine. He can not pee on his own d/t ongoing issues with his bladder/bladder stones.

## 2018-08-26 NOTE — Op Note (Signed)
[facility] Operative Note  08/26/18  Preoperative Diagnosis:  High grade small bowel obstruction with pneumatosis    Postoperative Diagnosis: Same   Procedure(s) Performed: Exploratory laparotomy with reduction of volvulus    Surgeon: Leatrice Jewels. Henreitta Leber, MD   Assistants: No qualified resident was available    Anesthesia: General endotracheal   Anesthesiologist: Shona Needles, MD    Specimens: None    Estimated Blood Loss: Minimal   Blood Replacement: None    Complications: None   Wound Class: Clean    Operative Indications: [name] is a 68 yo mental challenged gentleman who presented to the hospital with abdominal pain, nausea, vomiting, and no BM for 4 days. He had CT scan concerning for high grade small bowel obstruction with pneumatosis in the mesenteric vessels concerning for ischemic bowel. Given this, I felt that he need to be explored, and I discussed the risk and benefits with his guardian, [name] (aunt).  We discussed the risk of bleeding, infection, need for bowel resection, need for ICU care and possibly to remain intubated, need for transfer to [facility], need for SNF care following surgery.  After this discussion, she opted to proceed with surgery.   Findings: Dilated small bowel with distal bowel over 8-10cm in diameter, distal small bowel volvulized onto itself with decompressed terminal ileum    Procedure: The patient was taken to the operating room and placed supine. General endotracheal anesthesia was induced. Intravenous antibiotics were administered per protocol.  A nasogastric tube was positioned to decompress the stomach prior to coming back to the OR. A foley catheter was placed. The abdomen was prepared and draped in the usual sterile fashion.   A midline incision was made and care was taken on entry into the peritoneal cavity as the patient's abdominal wall was noted to be very thin on CT scan.  The bowel was very dilated but not  ischemic. The bowel was eviscerated and ran from the ligament of treitz to the terminal ileum. The most dilated small bowel was distal with a bowel having a diameter of 8-10cm. The bowel was volvulized onto itself and was prevent gas passage into the terminal ileum. There was no adhesions or scarring.  The ileocecal valve was component. The colon had no obvious masses or abnormalities. The NG was positioned in the stomach to aid with decompression.  The air and liquid was milked back into the stomach with about 2700 cc of fluid being evacuated.  There was still a significant amount of air in the distal ileum. To decompress this I attempted to milk the gas into the colon but the component ileocecal valve prevented this from making a large different.  I opted to decompress with a 25 gauge needle placed into the bowel and aspirated the air. The small needle stick was oversewn with a 3-0 silk lembert suture. There was no spillage of any contents. The bowel was placed back into the abdomen in proper alignment. The abdomen was closed with a running 0 PDS suture. The skin was closed with staples. A sterile dressing was applied.   Final inspection revealed acceptable hemostasis. All counts were correct at the end of the case. The patient was awakened from anesthesia and extubated without complication.  The patient went to the PACU in stable condition.   Levi Greenhouse, MD [facility] [address] [place], [place] [address] [phone] (office)

## 2018-08-26 NOTE — Progress Notes (Signed)
[facility]  Ex lap, reduction of volvulus of SB causing obstruction. NG to remain in place. ICU for monitoring. Foley in place overnight. Patient reported to pull things out and has pulled out foleys in the past.  Will order mittens for tonight. Toradol ordered for pain and PRN morphine.   His aunt,[name] is his guardian says he has the capacity of a 5-10 yr. She will accompany him.   Updated Dr. Minerva Areola.   Algis Greenhouse, MD [facility] [address] [place], [place] [address] [phone] (office)

## 2018-08-26 NOTE — Progress Notes (Signed)
[facility]  NG placed preop. 300 cc of dark brown fluid returned. Minimal decompression of the abdomen.   Levi Greenhouse, MD [facility] [address] [name] [place], [place] [address] [phone] (office)

## 2018-08-26 NOTE — Progress Notes (Signed)
Pt pulled NGT completely out while in wrist restraints. Dr. Henreitta Leber paged to see if NGT needed to be replaced- if so can I have something for sedation.  Dr. Henreitta Leber stated to leave out NGT unless pt starts to vomit. Will continue to monitor pt.

## 2018-08-26 NOTE — Anesthesia Procedure Notes (Signed)
Procedure Name: Intubation Performed by: Nicanor Alcon, MD Pre-anesthesia Checklist: Patient identified, Patient being monitored, Timeout performed, Emergency Drugs available and Suction available Patient Re-evaluated:Patient Re-evaluated prior to induction Oxygen Delivery Method: Circle System Utilized Preoxygenation: Pre-oxygenation with 100% oxygen Induction Type: IV induction Ventilation: Mask ventilation without difficulty Laryngoscope Size: Mac and 3 Grade View: Grade I Tube type: Oral Tube size: 8.0 mm Number of attempts: 1 Airway Equipment and Method: Stylet Placement Confirmation: ETT inserted through vocal cords under direct vision,  positive ETCO2 and breath sounds checked- equal and bilateral Secured at: 22 cm Tube secured with: Tape Dental Injury: Teeth and Oropharynx as per pre-operative assessment

## 2018-08-26 NOTE — Anesthesia Postprocedure Evaluation (Signed)
Anesthesia Post Note  Patient: [name]  Procedure(s) Performed: EXPLORATORY LAPAROTOMY reduction of volvulus (N/A )  Patient location during evaluation: PACU Anesthesia Type: General Level of consciousness: sedated and responds to stimulation Pain management: pain level controlled Vital Signs Assessment: post-procedure vital signs reviewed and stable Respiratory status: spontaneous breathing, nonlabored ventilation, respiratory function stable and patient connected to nasal cannula oxygen Cardiovascular status: blood pressure returned to baseline and stable Postop Assessment: no apparent nausea or vomiting Anesthetic complications: no     Last Vitals:  Vitals:   08/26/18 0155 08/26/18 0200  BP: 136/66 121/71  Pulse: 64 66  Resp: 14 14  SpO2: 98% 93%    Last Pain: There were no vitals filed for this visit.               Shona Needles

## 2018-08-26 NOTE — Progress Notes (Signed)
Pt found to be attempting to pull out NG tube despite mitts. NG had been removed approx 3 inches during struggle to secure pt's hands and secure the tube. Re-advanced NG and secured. MD Sherryll Burger notified pt is non-compliant with mitts at this time, family at bedside not re-directing pt. Order placed for bilateral wrist restraints in order to keep NG in place for decompression. Family and pt educated at this time about restraints and need. Family denies questions or concerns at this time.

## 2018-08-26 NOTE — Progress Notes (Addendum)
PROGRESS NOTE    [name]  ZOX:[id number] DOB: [birth year] DOA: 08/25/2018 PCP: Pearson Grippe, MD   Brief Narrative:  Per HPI: [name] is a 68 y.o. male with medical history significant for intellectual disability, type 2 diabetes mellitus, BPH, and admission in February for acute gangrenous cholecystitis now with percutaneous drain in place, presenting to the emergency department for evaluation of abdominal pain, anorexia, nausea, and nonbloody vomiting.  Patient is nonverbal and is accompanied by his caretaker who provides the history.  He has been rubbing his abdomen and grimacing today after a few days of progressive abdominal distention.  He is not eating or drinking anything today despite encouragement to do so, and has now developed right current vomiting.  His last bowel movement was 4 days ago.  Patient had a small bowel obstruction during his admission in February that fortunately resolved with conservative management.  Per report of his caretaker, he has not been coughing and has not appeared to be short of breath.  Patient was admitted with high-grade SBO and has undergone emergent exploratory laparotomy (early on 5/12) for the same and had reduction of volvulus.  Assessment & Plan:   Principal Problem:   SBO (small bowel obstruction) (HCC) Active Problems:   Intellectual disability   Diabetes mellitus type 2, controlled (HCC)   Gall bladder disease   Pneumatosis of intestines   1. High-grade SBO status post emergent exploratory laparotomy with reduction of volvulus on 5/12.  CT findings demonstrated high-grade SBO with pneumatosis of mesenteric vessels concerning for ischemia.  Appreciate further general surgery management and will continue on IV fluid with LR.  Continue on fentanyl for pain management and discontinue Toradol on account of some AKI.  Diet advancement per general surgery.  Continue on cefotetan as prescribed for now.  Repeat a.m. labs. 2. Type 2  diabetes.  Hemoglobin A1c noted to be 8% in December and patient uses metformin at home.  Continue on SSI with blood glucose that appears to be stable and controlled for now.  May need advancement with con commitment diet advancement eventually. 3. AKI.  This is currently nonoliguric with 750 mL urine output noted since admission.  Likely secondary to some volume shifts as well as use of Toradol.  Will discontinue at this time and continue using fentanyl for pain management.  Continue on aggressive IV fluid and monitor strict I's and O's and repeat a.m. labs.  Avoid nephrotoxic agents. 4. Cholecystitis.  Percutaneous drain in place.  Continue drain care. 5. Intellectual disability.  Caretaker at bedside to help assist.  Will need careful reassessment of pain management given communication deficit.   DVT prophylaxis: SCDs Code Status: Full Family Communication: Discussed with caretaker, [name], at bedside. Disposition Plan: Per general surgery.  Continue IV fluid and recheck labs in a.m.   Consultants:   General Surgery  Procedures:   None  Antimicrobials:   Cefotetan 5/11->   Subjective: Patient seen and evaluated today with no new acute complaints or concerns. No acute concerns or events noted overnight.  He appears to be resting comfortably this morning and appears to have very minimal pain.  Points to abdomen when asked about pain.  Objective: Vitals:   08/26/18 0315 08/26/18 0330 08/26/18 0500 08/26/18 0600  BP: 100/62 116/68 123/62 124/64  Pulse: 66 70 66 77  Resp: (!) Temp:      TempSrc:      SpO2: 97% 97% 100% 99%  Weight:  Height:        Intake/Output Summary (Last 24 hours) at 08/26/2018 0657 Last data filed at 08/26/2018 0624 Gross per 24 hour  Intake 2240.83 ml  Output 4080 ml  Net -1839.17 ml   Filed Weights   08/25/18 1955 08/26/18 0257  Weight: 68.3 kg 72.2 kg    Examination:  General exam: Appears calm and comfortable  Respiratory  system: Clear to auscultation. Respiratory effort normal.  Currently on 4 L nasal cannula. Cardiovascular system: S1 & S2 heard, RRR. No JVD, murmurs, rubs, gallops or clicks. No pedal edema. Gastrointestinal system:  Abdominal binder in place which was not currently removed for my examination.  Patient has minimal tenderness to palpation.  NG tube to LIS with minimal output. Central nervous system: Alert and awake. Extremities: SCDs present Skin: No rashes, lesions or ulcers Psychiatry: Cannot be appropriately assessed. Foley with scant, clear, yellow urine output    Data Reviewed: I have personally reviewed following labs and imaging studies  CBC: Recent Labs  Lab 08/25/18 2020 08/26/18 0400  WBC 11.4* 16.2*  NEUTROABS  --  15.0*  HGB 15.3 15.1  HCT 49.4 48.8  MCV 93.0 94.4  PLT 243 233   Basic Metabolic Panel: Recent Labs  Lab 08/25/18 2020 08/26/18 0400  NA 136 139  K 4.2 4.5  CL 101 106  CO2 23 22  GLUCOSE 164* 194*  BUN 30* 34*  CREATININE 1.16 1.40*  CALCIUM 10.2 9.2   GFR: Estimated Creatinine Clearance: 47.2 mL/min (A) (by C-G formula based on SCr of 1.4 mg/dL (H)). Liver Function Tests: Recent Labs  Lab 08/25/18 2020 08/26/18 0400  AST 17 16  ALT 11 10  ALKPHOS 79 68  BILITOT 0.4 0.2*  PROT 9.6* 8.0  ALBUMIN 4.4 3.6   Recent Labs  Lab 08/25/18 2020  LIPASE 30   No results for input(s): AMMONIA in the last 168 hours. Coagulation Profile: No results for input(s): INR, PROTIME in the last 168 hours. Cardiac Enzymes: No results for input(s): CKTOTAL, CKMB, CKMBINDEX, TROPONINI in the last 168 hours. BNP (last 3 results) No results for input(s): PROBNP in the last 8760 hours. HbA1C: No results for input(s): HGBA1C in the last 72 hours. CBG: Recent Labs  Lab 08/26/18 0202 08/26/18 0327  GLUCAP 144* 164*   Lipid Profile: No results for input(s): CHOL, HDL, LDLCALC, TRIG, CHOLHDL, LDLDIRECT in the last 72 hours. Thyroid Function Tests: No  results for input(s): TSH, T4TOTAL, FREET4, T3FREE, THYROIDAB in the last 72 hours. Anemia Panel: No results for input(s): VITAMINB12, FOLATE, FERRITIN, TIBC, IRON, RETICCTPCT in the last 72 hours. Sepsis Labs: Recent Labs  Lab 08/25/18 2209  LATICACIDVEN 1.7    Recent Results (from the past 240 hour(s))  SARS Coronavirus 2 (CEPHEID - Performed in [facility] hospital lab), Hosp Order     Status: None   Collection Time: 08/25/18 10:30 PM  Result Value Ref Range Status   SARS Coronavirus 2 NEGATIVE NEGATIVE Final    Comment: (NOTE) If result is NEGATIVE SARS-CoV-2 target nucleic acids are NOT DETECTED. The SARS-CoV-2 RNA is generally detectable in upper and lower  respiratory specimens during the acute phase of infection. The lowest  concentration of SARS-CoV-2 viral copies this assay can detect is 250  copies / mL. A negative result does not preclude SARS-CoV-2 infection  and should not be used as the sole basis for treatment or Levi  patient management decisions.  A negative result may occur with  improper specimen collection /  handling, submission of specimen Levi  than nasopharyngeal swab, presence of viral mutation(s) within the  areas targeted by this assay, and inadequate number of viral copies  (<250 copies / mL). A negative result must be combined with clinical  observations, patient history, and epidemiological information. If result is POSITIVE SARS-CoV-2 target nucleic acids are DETECTED. The SARS-CoV-2 RNA is generally detectable in upper and lower  respiratory specimens dur ing the acute phase of infection.  Positive  results are indicative of active infection with SARS-CoV-2.  Clinical  correlation with patient history and Levi diagnostic information is  necessary to determine patient infection status.  Positive results do  not rule out bacterial infection or co-infection with Levi viruses. If result is PRESUMPTIVE POSTIVE SARS-CoV-2 nucleic acids MAY BE  PRESENT.   A presumptive positive result was obtained on the submitted specimen  and confirmed on repeat testing.  While 2019 novel coronavirus  (SARS-CoV-2) nucleic acids may be present in the submitted sample  additional confirmatory testing may be necessary for epidemiological  and / or clinical management purposes  to differentiate between  SARS-CoV-2 and Levi Sarbecovirus currently known to infect humans.  If clinically indicated additional testing with an alternate test  methodology [phone]) is advised. The SARS-CoV-2 RNA is generally  detectable in upper and lower respiratory sp ecimens during the acute  phase of infection. The expected result is Negative. Fact Sheet for Patients:  BoilerBrush.com.cy Fact Sheet for Healthcare Providers: https://pope.com/ This test is not yet approved or cleared by the [place] FDA and has been authorized for detection and/or diagnosis of SARS-CoV-2 by FDA under an Emergency Use Authorization (EUA).  This EUA will remain in effect (meaning this test can be used) for the duration of the COVID-19 declaration under Section 564(b)(1) of the Act, 21 U.S.C. section 360bbb-3(b)(1), unless the authorization is terminated or revoked sooner. Performed at [facility], [address], [place], [place] [address]          Radiology Studies: Ct Abdomen Pelvis W Contrast  Result Date: 08/25/2018 CLINICAL DATA:  68 year old male with abdominal distension and pain and nausea. EXAM: CT ABDOMEN AND PELVIS WITH CONTRAST TECHNIQUE: Multidetector CT imaging of the abdomen and pelvis was performed using the standard protocol following bolus administration of intravenous contrast. CONTRAST:  OMNIPAQUE IOHEXOL 300 MG/ML  SOLN COMPARISON:  CT of the abdomen pelvis dated 05/23/2018 FINDINGS: Lower chest: Bibasilar dependent atelectatic changes noted. There is a 4 mm left lower lobe subpleural nodule. No definite  intra-abdominal free air. No free fluid. Hepatobiliary: The liver is unremarkable. The gallbladder is decompressed around a percutaneous cholecystostomy tube. Pancreas: Unremarkable. No pancreatic ductal dilatation or surrounding inflammatory changes. Spleen: Normal in size without focal abnormality. Adrenals/Urinary Tract: The adrenal glands are unremarkable. Multiple large and staghorn left renal calculi noted measuring up to 17 mm in the interpolar aspect of the kidney. There is a 16 mm stone or cluster of stones in the left renal pelvis with partial obstruction of the UPJ and resulting mild left hydronephrosis. There is no hydronephrosis on the right. The visualized ureters appear unremarkable. There are several stones within the urinary bladder. There is diffuse trabeculation of the bladder wall likely related to chronic bladder outlet obstruction or chronic inflammation. Stomach/Bowel: The stomach is distended with air and ingested content. There is retained ingested material within the visualized esophagus which may represent reflux or delayed esophageal clearance and dysmotility. There is mild thickening of the gastroesophageal junction which may be related to  chronic and recurrent reflux. There are multiple dilated loops of small bowel with air-fluid levels measuring up to 7.5 cm in diameter. There is a probable transition in the mid abdomen (series 2, image 60 and coronal series 5, image 51). There is large amount of stool throughout the colon. Vascular/Lymphatic: There is gas within mesenteric vessels (series 2, image 49 and 47 and 50) concerning for bowel ischemia. Clinical correlation and surgical consult is advised. No gas is noted within the portal veins. The abdominal aorta is unremarkable. The origins of the celiac axis, SMA, IMA are patent. The SMV, splenic vein, and main portal vein are patent. There is no adenopathy. Reproductive: Multiple fiducial markers noted within the prostate gland. Levi:  None Musculoskeletal: Degenerative changes of the spine. No acute osseous pathology. IMPRESSION: 1. High-grade small bowel obstruction with pneumatosis of the mesenteric vessels concerning for bowel ischemia. Clinical correlation and surgical consult is advised. 2. Distended stomach with ingested content in the distal esophagus. 3. Multiple nonobstructing left renal calculi. A partially obstructing stone or string of adjacent stones in the left UPJ with associated mild left hydronephrosis. Multiple bladder calculi. 4. Percutaneous cholecystostomy tube with decompression of the gallbladder. These results were called by telephone at the time of interpretation on 08/25/2018 at 9:58 pm to Dr. Raeford Razor , who verbally acknowledged these results. Electronically Signed   By: Elgie Collard M.D.   On: 08/25/2018 22:00   Dg Chest Port 1 View  Result Date: 08/25/2018 CLINICAL DATA:  Preoperative imaging EXAM: PORTABLE CHEST 1 VIEW COMPARISON:  None. FINDINGS: Shallow lung inflation with bibasilar atelectasis. No focal airspace consolidation. Cardiomediastinal contours are normal. IMPRESSION: No acute airspace disease. Electronically Signed   By: Deatra Robinson M.D.   On: 08/25/2018 23:09        Scheduled Meds:  insulin aspart  0-9 Units Subcutaneous Q4H   sodium chloride flush  3 mL Intravenous Once   sodium chloride flush  3 mL Intravenous Q12H   Continuous Infusions:  sodium chloride     cefoTEtan (CEFOTAN) IV       LOS: 1 day    Time spent: 30 minutes    Levi Poehler Hoover Brunette, DO [facility] Pager [phone]  If 7PM-7AM, please contact night-coverage [email or web address] Password [facility] 08/26/2018, 6:57 AM

## 2018-08-27 ENCOUNTER — Inpatient Hospital Stay (HOSPITAL_COMMUNITY): Payer: Medicare Other

## 2018-08-27 DIAGNOSIS — E118 Type 2 diabetes mellitus with unspecified complications: Secondary | ICD-10-CM | POA: Diagnosis not present

## 2018-08-27 DIAGNOSIS — K567 Ileus, unspecified: Secondary | ICD-10-CM | POA: Diagnosis not present

## 2018-08-27 DIAGNOSIS — K56609 Unspecified intestinal obstruction, unspecified as to partial versus complete obstruction: Secondary | ICD-10-CM | POA: Diagnosis not present

## 2018-08-27 DIAGNOSIS — F79 Unspecified intellectual disabilities: Secondary | ICD-10-CM | POA: Diagnosis not present

## 2018-08-27 LAB — COMPREHENSIVE METABOLIC PANEL
ALT: 10 U/L (ref 0–44)
AST: 17 U/L (ref 15–41)
Albumin: 3 g/dL — ABNORMAL LOW (ref 3.5–5.0)
Alkaline Phosphatase: 55 U/L (ref 38–126)
Anion gap: 9 (ref 5–15)
BUN: 38 mg/dL — ABNORMAL HIGH (ref 8–23)
CO2: 23 mmol/L (ref 22–32)
Calcium: 8.9 mg/dL (ref 8.9–10.3)
Chloride: 108 mmol/L (ref 98–111)
Creatinine, Ser: 1.92 mg/dL — ABNORMAL HIGH (ref 0.61–1.24)
GFR calc Af Amer: 41 mL/min — ABNORMAL LOW (ref >60)
GFR calc non Af Amer: 35 mL/min — ABNORMAL LOW (ref >60)
Glucose, Bld: 131 mg/dL — ABNORMAL HIGH (ref 70–99)
Potassium: 4.2 mmol/L (ref 3.5–5.1)
Sodium: 140 mmol/L (ref 135–145)
Total Bilirubin: 0.5 mg/dL (ref 0.3–1.2)
Total Protein: 6.9 g/dL (ref 6.5–8.1)

## 2018-08-27 LAB — GLUCOSE, CAPILLARY
Glucose-Capillary: 122 mg/dL — ABNORMAL HIGH (ref 70–99)
Glucose-Capillary: 125 mg/dL — ABNORMAL HIGH (ref 70–99)
Glucose-Capillary: 95 mg/dL (ref 70–99)
Glucose-Capillary: 108 mg/dL — ABNORMAL HIGH (ref 70–99)
Glucose-Capillary: 130 mg/dL — ABNORMAL HIGH (ref 70–99)

## 2018-08-27 LAB — CBC
HCT: 40 % (ref 39.0–52.0)
Hemoglobin: 12.7 g/dL — ABNORMAL LOW (ref 13.0–17.0)
MCH: 29.1 pg (ref 26.0–34.0)
MCHC: 31.8 g/dL (ref 30.0–36.0)
MCV: 91.5 fL (ref 80.0–100.0)
Platelets: 204 10*3/uL (ref 150–400)
RBC: 4.37 MIL/uL (ref 4.22–5.81)
RDW: 12.8 % (ref 11.5–15.5)
WBC: 11.7 10*3/uL — ABNORMAL HIGH (ref 4.0–10.5)
nRBC: 0 % (ref 0.0–0.2)

## 2018-08-27 LAB — LACTIC ACID, PLASMA: Lactic Acid, Venous: 1 mmol/L (ref 0.5–1.9)

## 2018-08-27 MED ORDER — HEPARIN SODIUM (PORCINE) 5000 UNIT/ML IJ SOLN
5000.0000 [IU] | Freq: Three times a day (TID) | INTRAMUSCULAR | Status: DC
  Administered 2018-08-27 – 2018-09-04 (×25): 5000 [IU] via SUBCUTANEOUS
  Filled 2018-08-27 (×26): qty 1

## 2018-08-27 NOTE — Progress Notes (Signed)
[facility] Progress Note  2 Days Post-Op  Subjective: No vomiting, no BMs or flatus. NG was pulled multiple times last night.  This was held overnight since no vomiting.   Objective: Vital signs in last 24 hours: Temp:  [97.6 F (36.4 C)-99.1 F (37.3 C)] 97.6 F (36.4 C) (05/13 1100) Pulse Rate:  [76-93] 78 (05/13 1100) Resp:  [10-27] 18 (05/13 1100) BP: (106-118)/(53-75) 111/71 (05/13 0400) SpO2:  [94 %-99 %] 96 % (05/13 1100) Weight:  [73.4 kg] 73.4 kg (05/13 0500) Last BM Date: 08/21/18  Intake/Output from previous day: 05/12 0701 - 05/13 0700 In: 2313.8 [I.V.:2119.7; IV Piggyback:194.1] Out: 1005 [Urine:925; Emesis/NG output:50; Drains:30] Intake/Output this shift: No intake/output data recorded.  General appearance: alert, cooperative and no distress Resp: normal work of breathing GI: soft, distended, tympanic, honeycomb with some staining, no erythema, binder in place  Lab Results:  Recent Labs    08/26/18 0400 08/27/18 0515  WBC 16.2* 11.7*  HGB 15.1 12.7*  HCT 48.8 40.0  PLT 233 204   BMET Recent Labs    08/26/18 0400 08/27/18 0515  NA 139 140  K 4.5 4.2  CL 106 108  CO2 22 23  GLUCOSE 194* 131*  BUN 34* 38*  CREATININE 1.40* 1.92*  CALCIUM 9.2 8.9   Studies/Results: Ct Abdomen Pelvis W Contrast  Result Date: 08/25/2018 CLINICAL DATA:  68 year old male with abdominal distension and pain and nausea. EXAM: CT ABDOMEN AND PELVIS WITH CONTRAST TECHNIQUE: Multidetector CT imaging of the abdomen and pelvis was performed using the standard protocol following bolus administration of intravenous contrast. CONTRAST:  100mL OMNIPAQUE IOHEXOL 300 MG/ML  SOLN COMPARISON:  CT of the abdomen pelvis dated 05/23/2018 FINDINGS: Lower chest: Bibasilar dependent atelectatic changes noted. There is a 4 mm left lower lobe subpleural nodule. No definite intra-abdominal free air. No free fluid. Hepatobiliary: The liver is unremarkable. The gallbladder is  decompressed around a percutaneous cholecystostomy tube. Pancreas: Unremarkable. No pancreatic ductal dilatation or surrounding inflammatory changes. Spleen: Normal in size without focal abnormality. Adrenals/Urinary Tract: The adrenal glands are unremarkable. Multiple large and staghorn left renal calculi noted measuring up to 17 mm in the interpolar aspect of the kidney. There is a 16 mm stone or cluster of stones in the left renal pelvis with partial obstruction of the UPJ and resulting mild left hydronephrosis. There is no hydronephrosis on the right. The visualized ureters appear unremarkable. There are several stones within the urinary bladder. There is diffuse trabeculation of the bladder wall likely related to chronic bladder outlet obstruction or chronic inflammation. Stomach/Bowel: The stomach is distended with air and ingested content. There is retained ingested material within the visualized esophagus which may represent reflux or delayed esophageal clearance and dysmotility. There is mild thickening of the gastroesophageal junction which may be related to chronic and recurrent reflux. There are multiple dilated loops of small bowel with air-fluid levels measuring up to 7.5 cm in diameter. There is a probable transition in the mid abdomen (series 2, image 60 and coronal series 5, image 51). There is large amount of stool throughout the colon. Vascular/Lymphatic: There is gas within mesenteric vessels (series 2, image 49 and 47 and 50) concerning for bowel ischemia. Clinical correlation and surgical consult is advised. No gas is noted within the portal veins. The abdominal aorta is unremarkable. The origins of the celiac axis, SMA, IMA are patent. The SMV, splenic vein, and main portal vein are patent. There is no adenopathy. Reproductive: Multiple fiducial markers  noted within the prostate gland. Other: None Musculoskeletal: Degenerative changes of the spine. No acute osseous pathology. IMPRESSION: 1.  High-grade small bowel obstruction with pneumatosis of the mesenteric vessels concerning for bowel ischemia. Clinical correlation and surgical consult is advised. 2. Distended stomach with ingested content in the distal esophagus. 3. Multiple nonobstructing left renal calculi. A partially obstructing stone or string of adjacent stones in the left UPJ with associated mild left hydronephrosis. Multiple bladder calculi. 4. Percutaneous cholecystostomy tube with decompression of the gallbladder. These results were called by telephone at the time of interpretation on 08/25/2018 at 9:58 pm to Dr. Raeford Razor , who verbally acknowledged these results. Electronically Signed   By: Elgie Collard M.D.   On: 08/25/2018 22:00   Dg Chest Port 1 View  Result Date: 08/25/2018 CLINICAL DATA:  Preoperative imaging EXAM: PORTABLE CHEST 1 VIEW COMPARISON:  None. FINDINGS: Shallow lung inflation with bibasilar atelectasis. No focal airspace consolidation. Cardiomediastinal contours are normal. IMPRESSION: No acute airspace disease. Electronically Signed   By: Deatra Robinson M.D.   On: 08/25/2018 23:09    Anti-infectives: Anti-infectives (From admission, onward)   Start     Dose/Rate Route Frequency Ordered Stop   08/26/18 1100  cefoTEtan (CEFOTAN) 2 g in sodium chloride 0.9 % 100 mL IVPB     2 g 200 mL/hr over 30 Minutes Intravenous Every 12 hours 08/26/18 0301 08/27/18 0923   08/26/18 0600  piperacillin-tazobactam (ZOSYN) IVPB 3.375 g  Status:  Discontinued     3.375 g 12.5 mL/hr over 240 Minutes Intravenous Every 8 hours 08/25/18 2347 08/26/18 0301   08/26/18 0000  piperacillin-tazobactam (ZOSYN) IVPB 3.375 g  Status:  Discontinued     3.375 g 100 mL/hr over 30 Minutes Intravenous  Once 08/25/18 2347 08/26/18 0301      Assessment/Plan: Mr. Delucchi is a 68 yo POD 2 s/p Ex lap, reduction of volvulus for a high grade small bowel obstruction. No signs of ischemic bowel on exploration. Having ileus now. NG pulled  and not replaced due to patient pulling, will monitor for any vomiting and replace if needed  -PRN For pain fentanyl  -OOB to chair as able  -NPO, NG in place, will await bowel function -Can have ice for comfort -Cholecystomy tube in place and will remain in place  -Keep binder in place -Leukocytosis post op improving, no fevers, completed cefotetan for 3 dose post surgery -AKI noted, Foley in place, Replace lytes with goal K > 4, Mg > 2 and Phos > 3, BMP and lytes ordered for tomorrow   -SCDs, added heparin sq, no signs of bleeding, H&H drop dilutional, not ambulating so do not want to get a DVT   -Mittens due to patient pulling at things and pulling out Foleys in the past  -Will continue to monitor, appreciate hospitalist assistance with patient   Updated Dr. Butler Denmark.    LOS: 2 days    [name] 08/27/2018

## 2018-08-27 NOTE — Progress Notes (Signed)
PROGRESS NOTE    [name]   WUX:[id number]  DOB: [birth year]  DOA: 08/25/2018 PCP: Pearson Grippe, MD   Brief Narrative:  DELOY ARCHEY  is a 68 y.o. male with medical history significant for intellectual disability, type 2 diabetes mellitus, BPH, and admission in February for acute gangrenous cholecystitis now with percutaneous drain in place, presenting to the emergency department for evaluation of abdominal pain, anorexia, nausea, and nonbloody vomiting. CT abdomen/pelvis is concerning for high-grade small bowel obstruction with pneumatosis of mesenteric vessels concerning for ischemia.   He was taken to the OR and was found to have a volvulus which was reduced.   Subjective: No complaints.     Assessment & Plan:   Principal Problem:   SBO (small bowel obstruction) due to volvulus s/p reduction - still has ileus- has pulled out NG tube and for now, General surgery has decided to leave it out- cont NPO - cont LR  Active Problems: AKI - likely due to vomiting and obstruction- cont IVF    Intellectual disability - aunt sitting at bedside to help him remain calm    Diabetes mellitus type 2, controlled - on SSI QID    Gall bladder disease - has a cholecystostomy drain      Time spent in minutes: 35 DVT prophylaxis: Heparin Code Status: Full code Family Communication:  Disposition Plan: cont to follow in ICU Consultants:   General surgery Procedures:  Exploratory laparotomy with reduction of volvulus  Antimicrobials:  Anti-infectives (From admission, onward)   Start     Dose/Rate Route Frequency Ordered Stop   08/26/18 1100  cefoTEtan (CEFOTAN) 2 g in sodium chloride 0.9 % 100 mL IVPB     2 g 200 mL/hr over 30 Minutes Intravenous Every 12 hours 08/26/18 0301 08/27/18 0923   08/26/18 0600  piperacillin-tazobactam (ZOSYN) IVPB 3.375 g  Status:  Discontinued     3.375 g 12.5 mL/hr over 240 Minutes Intravenous Every 8 hours 08/25/18 2347 08/26/18 0301   08/26/18 0000  piperacillin-tazobactam (ZOSYN) IVPB 3.375 g  Status:  Discontinued     3.375 g 100 mL/hr over 30 Minutes Intravenous  Once 08/25/18 2347 08/26/18 0301     Objective: Vitals:   08/27/18 0400 08/27/18 0500 08/27/18 0728 08/27/18 1100  BP: 111/71     Pulse: 82  93 78  Resp: Temp: 98.4 F (36.9 C)  98.5 F (36.9 C) 97.6 F (36.4 C)  TempSrc: Axillary  Oral Oral  SpO2: 98%  95% 96%  Weight:  73.4 kg    Height:        Intake/Output Summary (Last 24 hours) at 08/27/2018 1537 Last data filed at 08/27/2018 0500 Gross per 24 hour  Intake 1856.54 ml  Output 675 ml  Net 1181.54 ml   Filed Weights   08/25/18 1955 08/26/18 0257 08/27/18 0500  Weight: 68.3 kg 72.2 kg 73.4 kg    Examination: General exam: Appears comfortable  HEENT: PERRLA, oral mucosa moist, no sclera icterus or thrush Respiratory system: Clear to auscultation. Respiratory effort normal. Cardiovascular system: S1 & S2 heard, RRR.   Gastrointestinal system: Abdomen soft, non-tender,  Moderately distended. Normal bowel sounds. Central nervous system: Alert  Extremities: No cyanosis, clubbing or edema Skin: No rashes or ulcers    Data Reviewed: I have personally reviewed following labs and imaging studies  CBC: Recent Labs  Lab 08/25/18 2020 08/26/18 0400 08/27/18 0515  WBC 11.4* 16.2* 11.7*  NEUTROABS  --  15.0*  --   HGB 15.3 15.1 12.7*  HCT 49.4 48.8 40.0  MCV 93.0 94.4 91.5  PLT 243 233 204   Basic Metabolic Panel: Recent Labs  Lab 08/25/18 2020 08/26/18 0400 08/27/18 0515  NA 136 139 140  K 4.2 4.5 4.2  CL 101 106 108  CO2 23 22 23   GLUCOSE 164* 194* 131*  BUN 30* 34* 38*  CREATININE 1.16 1.40* 1.92*  CALCIUM 10.2 9.2 8.9   GFR: Estimated Creatinine Clearance: 34.4 mL/min (A) (by C-G formula based on SCr of 1.92 mg/dL (H)). Liver Function Tests: Recent Labs  Lab 08/25/18 2020 08/26/18 0400 08/27/18 0515  AST 17 16 17   ALT 11 10 10   ALKPHOS 79 68 55    BILITOT 0.4 0.2* 0.5  PROT 9.6* 8.0 6.9  ALBUMIN 4.4 3.6 3.0*   Recent Labs  Lab 08/25/18 2020  LIPASE 30   No results for input(s): AMMONIA in the last 168 hours. Coagulation Profile: No results for input(s): INR, PROTIME in the last 168 hours. Cardiac Enzymes: No results for input(s): CKTOTAL, CKMB, CKMBINDEX, TROPONINI in the last 168 hours. BNP (last 3 results) No results for input(s): PROBNP in the last 8760 hours. HbA1C: No results for input(s): HGBA1C in the last 72 hours. CBG: Recent Labs  Lab 08/26/18 1945 08/26/18 2353 08/27/18 0440 08/27/18 0726 08/27/18 1058  GLUCAP 121* 122* 122* 125* 95   Lipid Profile: No results for input(s): CHOL, HDL, LDLCALC, TRIG, CHOLHDL, LDLDIRECT in the last 72 hours. Thyroid Function Tests: No results for input(s): TSH, T4TOTAL, FREET4, T3FREE, THYROIDAB in the last 72 hours. Anemia Panel: No results for input(s): VITAMINB12, FOLATE, FERRITIN, TIBC, IRON, RETICCTPCT in the last 72 hours. Urine analysis:    Component Value Date/Time   COLORURINE YELLOW 08/25/2018 1958   APPEARANCEUR CLOUDY (A) 08/25/2018 1958   LABSPEC 1.033 (H) 08/25/2018 1958   PHURINE 5.0 08/25/2018 1958   GLUCOSEU NEGATIVE 08/25/2018 1958   HGBUR MODERATE (A) 08/25/2018 1958   BILIRUBINUR NEGATIVE 08/25/2018 1958   KETONESUR NEGATIVE 08/25/2018 1958   PROTEINUR 30 (A) 08/25/2018 1958   UROBILINOGEN 1.0 07/20/2014 1718   NITRITE NEGATIVE 08/25/2018 1958   LEUKOCYTESUR LARGE (A) 08/25/2018 1958   Sepsis Labs: @LABRCNTIP (procalcitonin:4,lacticidven:4) ) Recent Results (from the past 240 hour(s))  SARS Coronavirus 2 (CEPHEID - Performed in [facility] hospital lab), Hosp Order     Status: None   Collection Time: 08/25/18 10:30 PM  Result Value Ref Range Status   SARS Coronavirus 2 NEGATIVE NEGATIVE Final    Comment: (NOTE) If result is NEGATIVE SARS-CoV-2 target nucleic acids are NOT DETECTED. The SARS-CoV-2 RNA is generally detectable in upper and  lower  respiratory specimens during the acute phase of infection. The lowest  concentration of SARS-CoV-2 viral copies this assay can detect is 250  copies / mL. A negative result does not preclude SARS-CoV-2 infection  and should not be used as the sole basis for treatment or other  patient management decisions.  A negative result may occur with  improper specimen collection / handling, submission of specimen other  than nasopharyngeal swab, presence of viral mutation(s) within the  areas targeted by this assay, and inadequate number of viral copies  (<250 copies / mL). A negative result must be combined with clinical  observations, patient history, and epidemiological information. If result is POSITIVE SARS-CoV-2 target nucleic acids are DETECTED. The SARS-CoV-2 RNA is generally detectable in upper and lower  respiratory specimens dur ing the acute phase  of infection.  Positive  results are indicative of active infection with SARS-CoV-2.  Clinical  correlation with patient history and other diagnostic information is  necessary to determine patient infection status.  Positive results do  not rule out bacterial infection or co-infection with other viruses. If result is PRESUMPTIVE POSTIVE SARS-CoV-2 nucleic acids MAY BE PRESENT.   A presumptive positive result was obtained on the submitted specimen  and confirmed on repeat testing.  While 2019 novel coronavirus  (SARS-CoV-2) nucleic acids may be present in the submitted sample  additional confirmatory testing may be necessary for epidemiological  and / or clinical management purposes  to differentiate between  SARS-CoV-2 and other Sarbecovirus currently known to infect humans.  If clinically indicated additional testing with an alternate test  methodology [phone]([id number]) is advised. The SARS-CoV-2 RNA is generally  detectable in upper and lower respiratory sp ecimens during the acute  phase of infection. The expected result is  Negative. Fact Sheet for Patients:  BoilerBrush.com.cyhttps://www.fda.gov/media/136312/download Fact Sheet for Healthcare Providers: https://pope.com/https://www.fda.gov/media/136313/download This test is not yet approved or cleared by the [place] FDA and has been authorized for detection and/or diagnosis of SARS-CoV-2 by FDA under an Emergency Use Authorization (EUA).  This EUA will remain in effect (meaning this test can be used) for the duration of the COVID-19 declaration under Section 564(b)(1) of the Act, 21 U.S.C. section 360bbb-3(b)(1), unless the authorization is terminated or revoked sooner. Performed at [facility], [address] [place], [place] [address]   MRSA PCR Screening     Status: None   Collection Time: 08/26/18  2:44 AM  Result Value Ref Range Status   MRSA by PCR NEGATIVE NEGATIVE Final    Comment:        The GeneXpert MRSA Assay (FDA approved for NASAL specimens only), is one component of a comprehensive MRSA colonization surveillance program. It is not intended to diagnose MRSA infection nor to guide or monitor treatment for MRSA infections. Performed at [facility], [address] [place], [place] [address]      Radiology Studies: Ct Abdomen Pelvis W Contrast  Result Date: 08/25/2018 CLINICAL DATA:  68 year old male with abdominal distension and pain and nausea. EXAM: CT ABDOMEN AND PELVIS WITH CONTRAST TECHNIQUE: Multidetector CT imaging of the abdomen and pelvis was performed using the standard protocol following bolus administration of intravenous contrast. CONTRAST:  100mL OMNIPAQUE IOHEXOL 300 MG/ML  SOLN COMPARISON:  CT of the abdomen pelvis dated 05/23/2018 FINDINGS: Lower chest: Bibasilar dependent atelectatic changes noted. There is a 4 mm left lower lobe subpleural nodule. No definite intra-abdominal free air. No free fluid. Hepatobiliary: The liver is unremarkable. The gallbladder is decompressed around a percutaneous cholecystostomy tube. Pancreas: Unremarkable. No  pancreatic ductal dilatation or surrounding inflammatory changes. Spleen: Normal in size without focal abnormality. Adrenals/Urinary Tract: The adrenal glands are unremarkable. Multiple large and staghorn left renal calculi noted measuring up to 17 mm in the interpolar aspect of the kidney. There is a 16 mm stone or cluster of stones in the left renal pelvis with partial obstruction of the UPJ and resulting mild left hydronephrosis. There is no hydronephrosis on the right. The visualized ureters appear unremarkable. There are several stones within the urinary bladder. There is diffuse trabeculation of the bladder wall likely related to chronic bladder outlet obstruction or chronic inflammation. Stomach/Bowel: The stomach is distended with air and ingested content. There is retained ingested material within the visualized esophagus which may represent reflux or delayed esophageal clearance and dysmotility.  There is mild thickening of the gastroesophageal junction which may be related to chronic and recurrent reflux. There are multiple dilated loops of small bowel with air-fluid levels measuring up to 7.5 cm in diameter. There is a probable transition in the mid abdomen (series 2, image 60 and coronal series 5, image 51). There is large amount of stool throughout the colon. Vascular/Lymphatic: There is gas within mesenteric vessels (series 2, image 49 and 47 and 50) concerning for bowel ischemia. Clinical correlation and surgical consult is advised. No gas is noted within the portal veins. The abdominal aorta is unremarkable. The origins of the celiac axis, SMA, IMA are patent. The SMV, splenic vein, and main portal vein are patent. There is no adenopathy. Reproductive: Multiple fiducial markers noted within the prostate gland. Other: None Musculoskeletal: Degenerative changes of the spine. No acute osseous pathology. IMPRESSION: 1. High-grade small bowel obstruction with pneumatosis of the mesenteric vessels  concerning for bowel ischemia. Clinical correlation and surgical consult is advised. 2. Distended stomach with ingested content in the distal esophagus. 3. Multiple nonobstructing left renal calculi. A partially obstructing stone or string of adjacent stones in the left UPJ with associated mild left hydronephrosis. Multiple bladder calculi. 4. Percutaneous cholecystostomy tube with decompression of the gallbladder. These results were called by telephone at the time of interpretation on 08/25/2018 at 9:58 pm to Dr. Raeford Razor , who verbally acknowledged these results. Electronically Signed   By: Elgie Collard M.D.   On: 08/25/2018 22:00   Dg Chest Port 1 View  Result Date: 08/25/2018 CLINICAL DATA:  Preoperative imaging EXAM: PORTABLE CHEST 1 VIEW COMPARISON:  None. FINDINGS: Shallow lung inflation with bibasilar atelectasis. No focal airspace consolidation. Cardiomediastinal contours are normal. IMPRESSION: No acute airspace disease. Electronically Signed   By: Deatra Robinson M.D.   On: 08/25/2018 23:09      Scheduled Meds:  heparin injection (subcutaneous)  5,000 Units Subcutaneous Q8H   insulin aspart  0-9 Units Subcutaneous Q4H   sodium chloride flush  3 mL Intravenous Once   sodium chloride flush  3 mL Intravenous Q12H   Continuous Infusions:  lactated ringers 125 mL/hr at 08/27/18 0851     LOS: 2 days      Calvert Cantor, MD [facility] Pager: [email or web address] Password [facility] 08/27/2018, 3:37 PM

## 2018-08-27 NOTE — TOC Initial Note (Signed)
Transition of Care [facility](TOC) - Initial/Assessment Note    Patient Details  Name: [name] MRN: [id number] Date of Birth: Aug 25, 1950  Transition of Care [facility](TOC) CM/SW Contact:    Elliot GaultKathleen Kelvin Sennett, LCSW Phone Number: 08/27/2018, 1:10 PM  Clinical Narrative:                  Reviewed pt's record. Pt is high risk on the readmission scale. Pt is currently intubated. Pt's aunt is his legal guardian. Notes indicate that pt is non-verbal. Pt is established with a PCP. Notes indicate pt may need SNF rehab at dc. At this time, it is too early to determine TOC needs.  Will follow and assist with Transition of Care needs as pt's stay progresses.  Expected Discharge Plan: Skilled Nursing Facility Barriers to Discharge: Continued Medical Work up   Patient Goals and CMS Choice        Expected Discharge Plan and Services Expected Discharge Plan: Skilled Nursing Facility                                              Prior Living Arrangements/Services   Lives with:: Relatives Patient language and need for interpreter reviewed:: Yes Do you feel safe going back to the place where you live?: Yes      Need for Family Participation in Patient Care: Yes (Comment) Care giver support system in place?: Yes (comment)   Criminal Activity/Legal Involvement Pertinent to Current Situation/Hospitalization: No - Comment as needed  Activities of Daily Living Home Assistive Devices/Equipment: Eyeglasses, Dan HumphreysWalker (specify type) ADL Screening (condition at time of admission) Patient's cognitive ability adequate to safely complete daily activities?: No Is the patient deaf or have difficulty hearing?: Yes Does the patient have difficulty seeing, even when wearing glasses/contacts?: No Does the patient have difficulty concentrating, remembering, or making decisions?: Yes Patient able to express need for assistance with ADLs?: No Does the patient have difficulty dressing or bathing?:  Yes Independently performs ADLs?: No Communication: Dependent(pt can talk some, but its a whisper) Is this a change from baseline?: Pre-admission baseline Dressing (OT): Independent Grooming: Needs assistance Is this a change from baseline?: Pre-admission baseline Feeding: Independent Bathing: Needs assistance Is this a change from baseline?: Pre-admission baseline Toileting: Needs assistance(You have to tell patient to get up and go) Is this a change from baseline?: Pre-admission baseline In/Out Bed: Independent Walks in Home: Needs assistance(doesn't get up to walk unless you tell him) Is this a change from baseline?: Pre-admission baseline Does the patient have difficulty walking or climbing stairs?: Yes Weakness of Legs: Both Weakness of Arms/Hands: None  Permission Sought/Granted                  Emotional Assessment Appearance:: Appears stated age Attitude/Demeanor/Rapport: Unable to Assess Affect (typically observed): Unable to Assess Orientation: : Oriented to Self Alcohol / Substance Use: Not Applicable Psych Involvement: No (comment)  Admission diagnosis:  SBO (small bowel obstruction) (HCC) [K56.609] Preop testing [Z01.818] Small bowel ischemia (HCC) [K55.9] Patient Active Problem List   Diagnosis Date Noted  . Pneumatosis of intestines   . Gall bladder disease 08/25/2018  . Small bowel ischemia (HCC)   . Acute gangrenous cholecystitis 05/24/2018  . SBO (small bowel obstruction) (HCC) 05/24/2018  . Abdominal distention   . Pressure injury of skin 04/09/2018  . C. difficile diarrhea 04/08/2018  . Bilateral hydronephrosis  04/08/2018  . Pneumoperitoneum of unknown etiology   . Diarrhea 04/07/2018  . Nonverbal 04/07/2018  . Hydronephrosis with urinary obstruction due to ureteral calculus 06/20/2016  . Sepsis secondary to UTI (HCC) 06/20/2016  . Fever 06/19/2016  . Generalized weakness 06/19/2016  . Diabetes mellitus type 2, controlled (HCC) 06/19/2016   . Severe sepsis with septic shock (HCC) 06/19/2016  . UTI (urinary tract infection) 06/19/2016  . Hematuria 06/19/2016  . AKI (acute kidney injury) (HCC) 06/19/2016  . Hyponatremia 06/19/2016  . Hypokalemia 06/19/2016  . BPH (benign prostatic hyperplasia) 06/19/2016  . 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 Urinary retention 05/07/2014  . Intellectual disability 05/07/2014  . Hyperlipidemia 05/07/2014  . HTN (hypertension) 05/07/2014   PCP:  Pearson Grippe, MD Pharmacy:   [facility] [phone] - [place], [place] - [address] AT [facility] [address] [place] [address] Phone: [phone] Fax: [phone]  [facility] [place], [place] - [address] [place] [address] Phone: [phone] Fax: [phone]     Social Determinants of Health (SDOH) Interventions    Readmission Risk Interventions No flowsheet data found.

## 2018-08-27 NOTE — Care Management Important Message (Signed)
Important Message  Patient Details  Name: [name] MRN: [id number] Date of Birth: 29-Dec-1950   Medicare Important Message Given:  Yes    [name] 08/27/2018, 2:57 PM

## 2018-08-28 DIAGNOSIS — F79 Unspecified intellectual disabilities: Secondary | ICD-10-CM | POA: Diagnosis not present

## 2018-08-28 DIAGNOSIS — K567 Ileus, unspecified: Secondary | ICD-10-CM | POA: Diagnosis not present

## 2018-08-28 DIAGNOSIS — N179 Acute kidney failure, unspecified: Secondary | ICD-10-CM

## 2018-08-28 DIAGNOSIS — K56609 Unspecified intestinal obstruction, unspecified as to partial versus complete obstruction: Secondary | ICD-10-CM | POA: Diagnosis not present

## 2018-08-28 DIAGNOSIS — E118 Type 2 diabetes mellitus with unspecified complications: Secondary | ICD-10-CM | POA: Diagnosis not present

## 2018-08-28 LAB — GLUCOSE, CAPILLARY
Glucose-Capillary: 117 mg/dL — ABNORMAL HIGH (ref 70–99)
Glucose-Capillary: 120 mg/dL — ABNORMAL HIGH (ref 70–99)
Glucose-Capillary: 126 mg/dL — ABNORMAL HIGH (ref 70–99)
Glucose-Capillary: 133 mg/dL — ABNORMAL HIGH (ref 70–99)
Glucose-Capillary: 141 mg/dL — ABNORMAL HIGH (ref 70–99)
Glucose-Capillary: 152 mg/dL — ABNORMAL HIGH (ref 70–99)
Glucose-Capillary: 158 mg/dL — ABNORMAL HIGH (ref 70–99)

## 2018-08-28 LAB — CBC WITH DIFFERENTIAL/PLATELET
Abs Immature Granulocytes: 0.02 10*3/uL (ref 0.00–0.07)
Basophils Absolute: 0 10*3/uL (ref 0.0–0.1)
Basophils Relative: 0 %
Eosinophils Absolute: 0 10*3/uL (ref 0.0–0.5)
Eosinophils Relative: 0 %
HCT: 40.9 % (ref 39.0–52.0)
Hemoglobin: 13 g/dL (ref 13.0–17.0)
Immature Granulocytes: 0 %
Lymphocytes Relative: 8 %
Lymphs Abs: 0.9 10*3/uL (ref 0.7–4.0)
MCH: 29 pg (ref 26.0–34.0)
MCHC: 31.8 g/dL (ref 30.0–36.0)
MCV: 91.1 fL (ref 80.0–100.0)
Monocytes Absolute: 0.6 10*3/uL (ref 0.1–1.0)
Monocytes Relative: 5 %
Neutro Abs: 10.1 10*3/uL — ABNORMAL HIGH (ref 1.7–7.7)
Neutrophils Relative %: 87 %
Platelets: 220 10*3/uL (ref 150–400)
RBC: 4.49 MIL/uL (ref 4.22–5.81)
RDW: 12.5 % (ref 11.5–15.5)
WBC: 11.7 10*3/uL — ABNORMAL HIGH (ref 4.0–10.5)
nRBC: 0 % (ref 0.0–0.2)

## 2018-08-28 LAB — BASIC METABOLIC PANEL
Anion gap: 12 (ref 5–15)
BUN: 34 mg/dL — ABNORMAL HIGH (ref 8–23)
CO2: 25 mmol/L (ref 22–32)
Calcium: 9 mg/dL (ref 8.9–10.3)
Chloride: 107 mmol/L (ref 98–111)
Creatinine, Ser: 1.79 mg/dL — ABNORMAL HIGH (ref 0.61–1.24)
GFR calc Af Amer: 44 mL/min — ABNORMAL LOW (ref >60)
GFR calc non Af Amer: 38 mL/min — ABNORMAL LOW (ref >60)
Glucose, Bld: 122 mg/dL — ABNORMAL HIGH (ref 70–99)
Potassium: 3.7 mmol/L (ref 3.5–5.1)
Sodium: 144 mmol/L (ref 135–145)

## 2018-08-28 LAB — MAGNESIUM: Magnesium: 2 mg/dL (ref 1.7–2.4)

## 2018-08-28 LAB — PHOSPHORUS: Phosphorus: 3.3 mg/dL (ref 2.5–4.6)

## 2018-08-28 MED ORDER — BISACODYL 10 MG RE SUPP
10.0000 mg | Freq: Two times a day (BID) | RECTAL | Status: DC
  Administered 2018-08-28 – 2018-09-04 (×15): 10 mg via RECTAL
  Filled 2018-08-28 (×15): qty 1

## 2018-08-28 MED ORDER — POTASSIUM CHLORIDE 10 MEQ/100ML IV SOLN
10.0000 meq | INTRAVENOUS | Status: AC
  Administered 2018-08-28 (×2): 10 meq via INTRAVENOUS
  Filled 2018-08-28 (×2): qty 100

## 2018-08-28 NOTE — Progress Notes (Signed)
Pt refusing to get out of bed at this moment. I will order a Physical therapy consult and reassess in the a.m.

## 2018-08-28 NOTE — Progress Notes (Signed)
[facility] Progress Note  3 Days Post-Op  Subjective: No major events. No vomiting or flatus reported. Does not complain of any pain. Has not been to the chair as far as the RN knows.  Xray done yesterday with stool in rectum and dilated small bowel.   Objective: Vital signs in last 24 hours: Temp:  [97.3 F (36.3 C)-99.2 F (37.3 C)] 97.3 F (36.3 C) (05/14 1121) Pulse Rate:  [93-108] 96 (05/14 1300) Resp:  [12-30] 12 (05/14 1300) BP: (104-136)/(59-81) 136/63 (05/14 1300) SpO2:  [87 %-93 %] 90 % (05/14 1300) Weight:  [74.6 kg] 74.6 kg (05/14 0500) Last BM Date: (08/21/18)  Intake/Output from previous day: 05/13 0701 - 05/14 0700 In: 3023.2 [I.V.:3023.2] Out: 920 [Urine:750; Drains:170] Intake/Output this shift: No intake/output data recorded.  General appearance: alert and no distress Resp: normal work of breathing GI: soft, distended, tympanic, honeycomb dressing with staining, no erythema, abdominal binder in place  Lab Results:  Recent Labs    08/27/18 0515 08/28/18 0442  WBC 11.7* 11.7*  HGB 12.7* 13.0  HCT 40.0 40.9  PLT 204 220   BMET Recent Labs    08/27/18 0515 08/28/18 0442  NA 140 144  K 4.2 3.7  CL 108 107  CO2 23 25  GLUCOSE 131* 122*  BUN 38* 34*  CREATININE 1.92* 1.79*  CALCIUM 8.9 9.0   PT/INR No results for input(s): LABPROT, INR in the last 72 hours.  Studies/Results: Dg Abd Portable 2v  Result Date: 08/27/2018 CLINICAL DATA:  Ileus. Recent exploratory laparotomy with reduction of small bowel volvulus. EXAM: PORTABLE ABDOMEN - 2 VIEW COMPARISON:  CT abdomen and pelvis 08/25/2018 FINDINGS: Intraperitoneal free air is consistent with recent surgery. A percutaneous cholecystostomy tube is again noted in the right upper abdomen. There is prominent gaseous dilatation of multiple small bowel loops in the upper and mid abdomen. Stool is present in nondilated colon and rectum. There is also moderate gaseous distension of the  stomach. Skin staples and prostate fiducials are noted. The visualized lung bases are grossly clear within limitations of motion artifact. IMPRESSION: Prominent small bowel dilatation which may reflect postoperative ileus or recurrent obstruction. Electronically Signed   By: Sebastian Ache M.D.   On: 08/27/2018 17:17    Anti-infectives: Anti-infectives (From admission, onward)   Start     Dose/Rate Route Frequency Ordered Stop   08/26/18 1100  cefoTEtan (CEFOTAN) 2 g in sodium chloride 0.9 % 100 mL IVPB     2 g 200 mL/hr over 30 Minutes Intravenous Every 12 hours 08/26/18 0301 08/28/18 0454   08/26/18 0600  piperacillin-tazobactam (ZOSYN) IVPB 3.375 g  Status:  Discontinued     3.375 g 12.5 mL/hr over 240 Minutes Intravenous Every 8 hours 08/25/18 2347 08/26/18 0301   08/26/18 0000  piperacillin-tazobactam (ZOSYN) IVPB 3.375 g  Status:  Discontinued     3.375 g 100 mL/hr over 30 Minutes Intravenous  Once 08/25/18 2347 08/26/18 0301      Assessment/Plan: [name] is a 68 yo POD 3  s/p Ex lap, reduction of volvulus for a high grade small bowel obstruction. No with ileus but does not tolerate an NG tube. No vomiting. -PRN For pain fentanyl, requires minimal  -OOB to chair today, RN said she would get him to the chair   -NPO and ice chips ok  -Suppository added BID to get the stool out of the rectum and hopefully aid in more flatus and BMs passing  -Cholecystomy tube in place,  draining  -Keep binder in place -Leukocytosis resolving post op -AKI noted and improving, Foley in place, lytes reviewed, K replaced and Mg and Phos good  -SCDs, heparin sq   LOS: 3 days    Lucretia RoersLindsay C Joshawn Crissman 08/28/2018

## 2018-08-28 NOTE — Progress Notes (Signed)
PROGRESS NOTE    [name]   NWG:[id number]  DOB: [birth year]  DOA: 08/25/2018 PCP: [name], [name], MD   Brief Narrative:  [name]  is a 68 y.o. male with medical history significant for intellectual disability, type 2 diabetes mellitus, BPH, and admission in February for acute gangrenous cholecystitis now with percutaneous drain in place, presenting to the emergency department for evaluation of abdominal pain, anorexia, nausea, and nonbloody vomiting. CT abdomen/pelvis is concerning for high-grade small bowel obstruction with pneumatosis of mesenteric vessels concerning for ischemia.   He was taken to the OR and was found to have a volvulus which was reduced.   Subjective: No complaints.   Assessment & Plan:   Principal Problem:   SBO (small bowel obstruction) due to volvulus s/p reduction - still has ileus- has pulled out NG tube and for now, General surgery has decided to leave it out - abdomen remains distended today with poor bowel sounds - cont NPO and IVF    Active Problems: AKI - likely due to vomiting and obstruction- cont IVF    Intellectual disability - aunt sitting at bedside to help him remain calm    Diabetes mellitus type 2, controlled - on SSI QID    Gall bladder disease - has a cholecystostomy drain    Time spent in minutes: 35 DVT prophylaxis: Heparin Code Status: Full code Family Communication: Aunt at bedside Disposition Plan: cont to follow in ICU Consultants:   General surgery Procedures:  Exploratory laparotomy with reduction of volvulus  Antimicrobials:  Anti-infectives (From admission, onward)   Start     Dose/Rate Route Frequency Ordered Stop   08/26/18 1100  cefoTEtan (CEFOTAN) 2 g in sodium chloride 0.9 % 100 mL IVPB     2 g 200 mL/hr over 30 Minutes Intravenous Every 12 hours 08/26/18 0301 08/28/18 0454   08/26/18 0600  piperacillin-tazobactam (ZOSYN) IVPB 3.375 g  Status:  Discontinued     3.375 g 12.5 mL/hr over 240  Minutes Intravenous Every 8 hours 08/25/18 2347 08/26/18 0301   08/26/18 0000  piperacillin-tazobactam (ZOSYN) IVPB 3.375 g  Status:  Discontinued     3.375 g 100 mL/hr over 30 Minutes Intravenous  Once 08/25/18 2347 08/26/18 0301     Objective: Vitals:   08/28/18 0500 08/28/18 0600 08/28/18 0724 08/28/18 0800  BP: 130/73 125/78  128/72  Pulse: (!) 102 99 100 98  Resp: 15 16 16 17   Temp:   98.5 F (36.9 C)   TempSrc:   Oral   SpO2: 90% 91% (!) 88% 90%  Weight: 74.6 kg     Height:        Intake/Output Summary (Last 24 hours) at 08/28/2018 1102 Last data filed at 08/28/2018 0500 Gross per 24 hour  Intake 3023.22 ml  Output 920 ml  Net 2103.22 ml   Filed Weights   08/26/18 0257 08/27/18 0500 08/28/18 0500  Weight: 72.2 kg 73.4 kg 74.6 kg    Examination: General exam: Appears comfortable  HEENT: PERRLA, oral mucosa moist, no sclera icterus or thrush Respiratory system: Clear to auscultation. Respiratory effort normal. Cardiovascular system: S1 & S2 heard,  No murmurs  Gastrointestinal system: Abdomen soft, non-tender, moderate-severely distended. Normal bowel sounds   Central nervous system: Alert - does not follow commands or talk to me Extremities: No cyanosis, clubbing or edema Skin: No rashes or ulcers   Data Reviewed: I have personally reviewed following labs and imaging studies  CBC: Recent Labs  Lab 08/25/18  2020 08/26/18 0400 08/27/18 0515 08/28/18 0442  WBC 11.4* 16.2* 11.7* 11.7*  NEUTROABS  --  15.0*  --  10.1*  HGB 15.3 15.1 12.7* 13.0  HCT 49.4 48.8 40.0 40.9  MCV 93.0 94.4 91.5 91.1  PLT 243 233 204 220   Basic Metabolic Panel: Recent Labs  Lab 08/25/18 2020 08/26/18 0400 08/27/18 0515 08/28/18 0442  NA 136 139 140 144  K 4.2 4.5 4.2 3.7  CL 101 106 108 107  CO2 GLUCOSE 164* 194* 131* 122*  BUN 30* 34* 38* 34*  CREATININE 1.16 1.40* 1.92* 1.79*  CALCIUM 10.2 9.2 8.9 9.0  MG  --   --   --  2.0  PHOS  --   --   --  3.3    GFR: Estimated Creatinine Clearance: 36.9 mL/min (A) (by C-G formula based on SCr of 1.79 mg/dL (H)). Liver Function Tests: Recent Labs  Lab 08/25/18 2020 08/26/18 0400 08/27/18 0515  AST ALT ALKPHOS 79 68 55  BILITOT 0.4 0.2* 0.5  PROT 9.6* 8.0 6.9  ALBUMIN 4.4 3.6 3.0*   Recent Labs  Lab 08/25/18 2020  LIPASE 30   No results for input(s): AMMONIA in the last 168 hours. Coagulation Profile: No results for input(s): INR, PROTIME in the last 168 hours. Cardiac Enzymes: No results for input(s): CKTOTAL, CKMB, CKMBINDEX, TROPONINI in the last 168 hours. BNP (last 3 results) No results for input(s): PROBNP in the last 8760 hours. HbA1C: No results for input(s): HGBA1C in the last 72 hours. CBG: Recent Labs  Lab 08/27/18 1600 08/27/18 2008 08/28/18 0016 08/28/18 0422 08/28/18 0717  GLUCAP 108* 130* 126* 120* 133*   Lipid Profile: No results for input(s): CHOL, HDL, LDLCALC, TRIG, CHOLHDL, LDLDIRECT in the last 72 hours. Thyroid Function Tests: No results for input(s): TSH, T4TOTAL, FREET4, T3FREE, THYROIDAB in the last 72 hours. Anemia Panel: No results for input(s): VITAMINB12, FOLATE, FERRITIN, TIBC, IRON, RETICCTPCT in the last 72 hours. Urine analysis:    Component Value Date/Time   COLORURINE YELLOW 08/25/2018 1958   APPEARANCEUR CLOUDY (A) 08/25/2018 1958   LABSPEC 1.033 (H) 08/25/2018 1958   PHURINE 5.0 08/25/2018 1958   GLUCOSEU NEGATIVE 08/25/2018 1958   HGBUR MODERATE (A) 08/25/2018 1958   BILIRUBINUR NEGATIVE 08/25/2018 1958   KETONESUR NEGATIVE 08/25/2018 1958   PROTEINUR 30 (A) 08/25/2018 1958   UROBILINOGEN 1.0 07/20/2014 1718   NITRITE NEGATIVE 08/25/2018 1958   LEUKOCYTESUR LARGE (A) 08/25/2018 1958   Sepsis Labs: (procalcitonin:4,lacticidven:4) ) Recent Results (from the past 240 hour(s))  SARS Coronavirus 2 (CEPHEID - Performed in [name] hospital lab), Hosp Order     Status: None   Collection Time:  08/25/18 10:30 PM  Result Value Ref Range Status   SARS Coronavirus 2 NEGATIVE NEGATIVE Final    Comment: (NOTE) If result is NEGATIVE SARS-CoV-2 target nucleic acids are NOT DETECTED. The SARS-CoV-2 RNA is generally detectable in upper and lower  respiratory specimens during the acute phase of infection. The lowest  concentration of SARS-CoV-2 viral copies this assay can detect is 250  copies / mL. A negative result does not preclude SARS-CoV-2 infection  and should not be used as the sole basis for treatment or other  patient management decisions.  A negative result may occur with  improper specimen collection / handling, submission of specimen other  than nasopharyngeal swab, presence of viral mutation(s) within the  areas targeted by  this assay, and inadequate number of viral copies  (<250 copies / mL). A negative result must be combined with clinical  observations, patient history, and epidemiological information. If result is POSITIVE SARS-CoV-2 target nucleic acids are DETECTED. The SARS-CoV-2 RNA is generally detectable in upper and lower  respiratory specimens dur ing the acute phase of infection.  Positive  results are indicative of active infection with SARS-CoV-2.  Clinical  correlation with patient history and other diagnostic information is  necessary to determine patient infection status.  Positive results do  not rule out bacterial infection or co-infection with other viruses. If result is PRESUMPTIVE POSTIVE SARS-CoV-2 nucleic acids MAY BE PRESENT.   A presumptive positive result was obtained on the submitted specimen  and confirmed on repeat testing.  While 2019 novel coronavirus  (SARS-CoV-2) nucleic acids may be present in the submitted sample  additional confirmatory testing may be necessary for epidemiological  and / or clinical management purposes  to differentiate between  SARS-CoV-2 and other Sarbecovirus currently known to infect humans.  If clinically  indicated additional testing with an alternate test  methodology [phone]) is advised. The SARS-CoV-2 RNA is generally  detectable in upper and lower respiratory sp ecimens during the acute  phase of infection. The expected result is Negative. Fact Sheet for Patients:  BoilerBrush.com.cy Fact Sheet for Healthcare Providers: https://pope.com/ This test is not yet approved or cleared by the [place] FDA and has been authorized for detection and/or diagnosis of SARS-CoV-2 by FDA under an Emergency Use Authorization (EUA).  This EUA will remain in effect (meaning this test can be used) for the duration of the COVID-19 declaration under Section 564(b)(1) of the Act, 21 U.S.C. section 360bbb-3(b)(1), unless the authorization is terminated or revoked sooner. Performed at [facility], [address], [place], [place] [address]   MRSA PCR Screening     Status: None   Collection Time: 08/26/18  2:44 AM  Result Value Ref Range Status   MRSA by PCR NEGATIVE NEGATIVE Final    Comment:        The GeneXpert MRSA Assay (FDA approved for NASAL specimens only), is one component of a comprehensive MRSA colonization surveillance program. It is not intended to diagnose MRSA infection nor to guide or monitor treatment for MRSA infections. Performed at [facility], [address] [place], [place] [address]      Radiology Studies: Dg Abd Portable 2v  Result Date: 08/27/2018 CLINICAL DATA:  Ileus. Recent exploratory laparotomy with reduction of small bowel volvulus. EXAM: PORTABLE ABDOMEN - 2 VIEW COMPARISON:  CT abdomen and pelvis 08/25/2018 FINDINGS: Intraperitoneal free air is consistent with recent surgery. A percutaneous cholecystostomy tube is again noted in the right upper abdomen. There is prominent gaseous dilatation of multiple small bowel loops in the upper and mid abdomen. Stool is present in nondilated colon and rectum. There is also  moderate gaseous distension of the stomach. Skin staples and prostate fiducials are noted. The visualized lung bases are grossly clear within limitations of motion artifact. IMPRESSION: Prominent small bowel dilatation which may reflect postoperative ileus or recurrent obstruction. Electronically Signed   By: Sebastian Ache M.D.   On: 08/27/2018 17:17      Scheduled Meds: . heparin injection (subcutaneous)  5,000 Units Subcutaneous Q8H  . insulin aspart  0-9 Units Subcutaneous Q4H  . sodium chloride flush  3 mL Intravenous Once  . sodium chloride flush  3 mL Intravenous Q12H   Continuous Infusions: . lactated ringers 125 mL/hr  at 08/28/18 0937     LOS: 3 days      Calvert Cantor, MD [facility] Pager: [email or web address] Password [email or web address] 08/28/2018, 11:02 AM

## 2018-08-29 DIAGNOSIS — K56609 Unspecified intestinal obstruction, unspecified as to partial versus complete obstruction: Secondary | ICD-10-CM | POA: Diagnosis not present

## 2018-08-29 DIAGNOSIS — E118 Type 2 diabetes mellitus with unspecified complications: Secondary | ICD-10-CM | POA: Diagnosis not present

## 2018-08-29 DIAGNOSIS — K567 Ileus, unspecified: Secondary | ICD-10-CM | POA: Diagnosis not present

## 2018-08-29 DIAGNOSIS — F79 Unspecified intellectual disabilities: Secondary | ICD-10-CM | POA: Diagnosis not present

## 2018-08-29 LAB — BASIC METABOLIC PANEL
Anion gap: 11 (ref 5–15)
BUN: 35 mg/dL — ABNORMAL HIGH (ref 8–23)
CO2: 23 mmol/L (ref 22–32)
Calcium: 8.8 mg/dL — ABNORMAL LOW (ref 8.9–10.3)
Chloride: 108 mmol/L (ref 98–111)
Creatinine, Ser: 1.6 mg/dL — ABNORMAL HIGH (ref 0.61–1.24)
GFR calc Af Amer: 51 mL/min — ABNORMAL LOW (ref >60)
GFR calc non Af Amer: 44 mL/min — ABNORMAL LOW (ref >60)
Glucose, Bld: 139 mg/dL — ABNORMAL HIGH (ref 70–99)
Potassium: 4 mmol/L (ref 3.5–5.1)
Sodium: 142 mmol/L (ref 135–145)

## 2018-08-29 LAB — GLUCOSE, CAPILLARY
Glucose-Capillary: 140 mg/dL — ABNORMAL HIGH (ref 70–99)
Glucose-Capillary: 144 mg/dL — ABNORMAL HIGH (ref 70–99)
Glucose-Capillary: 132 mg/dL — ABNORMAL HIGH (ref 70–99)
Glucose-Capillary: 148 mg/dL — ABNORMAL HIGH (ref 70–99)
Glucose-Capillary: 148 mg/dL — ABNORMAL HIGH (ref 70–99)

## 2018-08-29 LAB — PHOSPHORUS: Phosphorus: 2.3 mg/dL — ABNORMAL LOW (ref 2.5–4.6)

## 2018-08-29 LAB — MAGNESIUM: Magnesium: 2 mg/dL (ref 1.7–2.4)

## 2018-08-29 MED ORDER — SODIUM PHOSPHATES 45 MMOLE/15ML IV SOLN
30.0000 mmol | Freq: Once | INTRAVENOUS | Status: AC
  Administered 2018-08-29: 30 mmol via INTRAVENOUS
  Filled 2018-08-29: qty 10

## 2018-08-29 NOTE — NC FL2 (Signed)
[place] MEDICAID FL2 LEVEL OF CARE SCREENING TOOL     IDENTIFICATION  Patient Name: [name] Birthdate: 12/30/1950 Sex: male Admission Date (Current Location): 08/25/2018  [facility] [place]   Facility and Address:  [facility],  [address], [place] [id number]      Provider Number: [id number]  Attending Physician Name and Address:  Calvert Cantorizwan, Saima, MD  Relative Name and Phone Number:       Current Level of Care: Hospital Recommended Level of Care: Skilled Nursing Facility Prior Approval Number:    Date Approved/Denied:   PASRR Number:    Discharge Plan: SNF    Current Diagnoses: Patient Active Problem List   Diagnosis Date Noted  . Pneumatosis of intestines   . Gall bladder disease 08/25/2018  . Small bowel ischemia (HCC)   . Acute gangrenous cholecystitis 05/24/2018  . SBO (small bowel obstruction) (HCC) 05/24/2018  . Abdominal distention   . Pressure injury of skin 04/09/2018  . C. difficile diarrhea 04/08/2018  . Bilateral hydronephrosis 04/08/2018  . Pneumoperitoneum of unknown etiology   . Diarrhea 04/07/2018  . Nonverbal 04/07/2018  . Hydronephrosis with urinary obstruction due to ureteral calculus 06/20/2016  . Sepsis secondary to UTI (HCC) 06/20/2016  . Fever 06/19/2016  . Generalized weakness 06/19/2016  . Diabetes mellitus type 2, controlled (HCC) 06/19/2016  . Severe sepsis with septic shock (HCC) 06/19/2016  . UTI (urinary tract infection) 06/19/2016  . Hematuria 06/19/2016  . AKI (acute kidney injury) (HCC) 06/19/2016  . Hyponatremia 06/19/2016  . Hypokalemia 06/19/2016  . BPH (benign prostatic hyperplasia) 06/19/2016  . 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 Urinary retention 05/07/2014  . Intellectual disability 05/07/2014  . Hyperlipidemia 05/07/2014  . HTN (hypertension) 05/07/2014    Orientation RESPIRATION BLADDER Height & Weight     Self  Normal Incontinent Weight: 76.4 kg Height:   5\' 7"  (170.2 cm)  BEHAVIORAL SYMPTOMS/MOOD NEUROLOGICAL BOWEL NUTRITION STATUS  (none) (none) Continent Diet  AMBULATORY STATUS COMMUNICATION OF NEEDS Skin   Extensive Assist Verbally Surgical wounds                       Personal Care Assistance Level of Assistance  Bathing, Feeding, Dressing Bathing Assistance: Limited assistance Feeding assistance: Independent Dressing Assistance: Limited assistance     Functional Limitations Info  Sight, Hearing, Speech Sight Info: Adequate Hearing Info: Adequate Speech Info: Adequate    SPECIAL CARE FACTORS FREQUENCY  PT (By licensed PT)     PT Frequency: 5X/W              Contractures Contractures Info: Not present    Additional Factors Info  Code Status, Allergies Code Status Info: full Allergies Info: NKA           Current Medications (08/29/2018):  This is the current hospital active medication list Current Facility-Administered Medications  Medication Dose Route Frequency Provider Last Rate Last Dose  . acetaminophen (TYLENOL) suppository 650 mg  650 mg Rectal Q6H PRN Lucretia RoersBridges, Lindsay C, MD      . bisacodyl (DULCOLAX) suppository 10 mg  10 mg Rectal BID Lucretia RoersBridges, Lindsay C, MD   10 mg at 08/29/18 0859  . fentaNYL (SUBLIMAZE) injection 25-50 mcg  25-50 mcg Intravenous Q2H PRN Lucretia RoersBridges, Lindsay C, MD   50 mcg at 08/28/18 1323  . heparin injection 5,000 Units  5,000 Units Subcutaneous Q8H Lucretia RoersBridges, Lindsay C, MD   5,000 Units at 08/29/18 1506  . insulin  aspart (novoLOG) injection 0-9 Units  0-9 Units Subcutaneous Q4H [name], MD   1 Units at 08/29/18 1107  . lactated ringers infusion   Intravenous Continuous Maurilio Lovely D, DO 125 mL/hr at 08/29/18 1100    . ondansetron (ZOFRAN) tablet 4 mg  4 mg Oral Q6H PRN [name], MD       Or  . ondansetron [facility]) injection 4 mg  4 mg Intravenous Q6H PRN [name], MD   4 mg at 08/27/18 1555  . sodium chloride flush (NS) 0.9 % injection 3 mL  3 mL  Intravenous Once Algis Greenhouse C, MD      . sodium chloride flush (NS) 0.9 % injection 3 mL  3 mL Intravenous Q12H [name], MD   3 mL at 08/28/18 2130  . sodium phosphate 30 mmol in dextrose 5 % 250 mL infusion  30 mmol Intravenous Once Calvert Cantor, MD 43 mL/hr at 08/29/18 1338 30 mmol at 08/29/18 1338     Discharge Medications: Please see discharge summary for a list of discharge medications.  Relevant Imaging Results:  Relevant Lab Results:   Additional Information    Ida Rogue, LCSW

## 2018-08-29 NOTE — Plan of Care (Signed)
  Problem: Acute Rehab PT Goals(only PT should resolve) Goal: Pt Will Go Supine/Side To Sit Outcome: Progressing Flowsheets (Taken 08/29/2018 1302) Pt will go Supine/Side to Sit: with min guard assist Goal: Patient Will Transfer Sit To/From Stand Outcome: Progressing Flowsheets (Taken 08/29/2018 1302) Patient will transfer sit to/from stand: with min guard assist Goal: Pt Will Transfer Bed To Chair/Chair To Bed Outcome: Progressing Flowsheets (Taken 08/29/2018 1302) Pt will Transfer Bed to Chair/Chair to Bed: min guard assist Goal: Pt Will Ambulate Outcome: Progressing Flowsheets (Taken 08/29/2018 1302) Pt will Ambulate: 50 feet; with minimal assist; with rolling walker   1:02 PM, 08/29/18 Ocie Bob, MPT Physical Therapist with [facility] [phone] office [phone] mobile phone

## 2018-08-29 NOTE — TOC Progression Note (Signed)
Transition of Care [facility]) - Progression Note    Patient Details  Name: [name] MRN: [id number] Date of Birth: 1950-04-29  Transition of Care [facility]) CM/SW [place], [place] Phone Number: 08/29/2018, 4:18 PM  Clinical Narrative:   Met with patient and two aunts at bedside, one of whom was [name], guardian.  They state the ultimate goal is to get him back home with his nephew, which is where he was residing until 3 months ago, when his medical needs increased, and has since been staying with [name] and her husband.  Her 4 sisters and brother all help as well.  She states his baseline has appeared to be slowly declining over the past year.  Pt requires catheterization for urination and uses a walker for limited mobility. At this point, although aunt states she needs to consult with the rest of the family members, she would like his information sent to [facility] to see if they will make a bed offer.  She thinks family will be in agreement that not only would he get the care he needs post procedure, but she would get some much needed rest.   PASSR applied for-pt is IDD so will require follow up-and FL2 and notes sent to [facility].  Follow up with family on Monday     Expected Discharge Plan: [place] Barriers to Discharge: Continued Medical Work up  Expected Discharge Plan and Services Expected Discharge Plan: [place]                                               Social Determinants of Health (SDOH) Interventions    Readmission Risk Interventions Readmission Risk Prevention Plan 08/27/2018  Transportation Screening Complete  Some recent data might be hidden

## 2018-08-29 NOTE — Care Management Important Message (Signed)
Important Message  Patient Details  Name: [name] MRN: [id number] Date of Birth: 1951/02/09   Medicare Important Message Given:  Yes    [name] 08/29/2018, 1:17 PM

## 2018-08-29 NOTE — Progress Notes (Signed)
PROGRESS NOTE    [name]   ZOX:[id number]  DOB: [birth year]  DOA: 08/25/2018 PCP: Pearson Grippe, MD   Brief Narrative:  [name]  is a 68 y.o. male with medical history significant for intellectual disability, type 2 diabetes mellitus, BPH, and admission in February for acute gangrenous cholecystitis now with percutaneous drain in place, presenting to the emergency department for evaluation of abdominal pain, anorexia, nausea, and nonbloody vomiting. CT abdomen/pelvis is concerning for high-grade small bowel obstruction with pneumatosis of mesenteric vessels concerning for ischemia.   He was taken to the OR and was found to have a volvulus which was reduced.   Subjective: No complaints.   Assessment & Plan:   Principal Problem:   SBO (small bowel obstruction) due to volvulus s/p reduction - still has ileus- has pulled out NG tube and for now, General surgery has decided to leave it out - abdomen still remains distended today with poor bowel sounds - cont NPO and IVF- attempt to ambulate to help resolve ileus    Active Problems: AKI - likely due to vomiting and obstruction- cont IVF until adequately able to take orals    Intellectual disability - aunt sitting at bedside to help him remain calm    Diabetes mellitus type 2, controlled - on SSI QID    Gall bladder disease - has a cholecystostomy drain    Time spent in minutes: 35 DVT prophylaxis: Heparin Code Status: Full code Family Communication: Aunt at bedside Disposition Plan:  Awaiting ileus to resolve Consultants:   General surgery Procedures:  Exploratory laparotomy with reduction of volvulus  Antimicrobials:  Anti-infectives (From admission, onward)   Start     Dose/Rate Route Frequency Ordered Stop   08/26/18 1100  cefoTEtan (CEFOTAN) 2 g in sodium chloride 0.9 % 100 mL IVPB     2 g 200 mL/hr over 30 Minutes Intravenous Every 12 hours 08/26/18 0301 08/28/18 0454   08/26/18 0600   piperacillin-tazobactam (ZOSYN) IVPB 3.375 g  Status:  Discontinued     3.375 g 12.5 mL/hr over 240 Minutes Intravenous Every 8 hours 08/25/18 2347 08/26/18 0301   08/26/18 0000  piperacillin-tazobactam (ZOSYN) IVPB 3.375 g  Status:  Discontinued     3.375 g 100 mL/hr over 30 Minutes Intravenous  Once 08/25/18 2347 08/26/18 0301     Objective: Vitals:   08/29/18 0600 08/29/18 0700 08/29/18 0735 08/29/18 1126  BP: (!) 121/57 124/61    Pulse: 95 97    Resp: 16 (!) 21    Temp:   98.3 F (36.8 C) 97.8 F (36.6 C)  TempSrc:   Oral Oral  SpO2: (!) 87% (!) 89%    Weight:      Height:        Intake/Output Summary (Last 24 hours) at 08/29/2018 1148 Last data filed at 08/29/2018 0717 Gross per 24 hour  Intake 3289.21 ml  Output -  Net 3289.21 ml   Filed Weights   08/27/18 0500 08/28/18 0500 08/29/18 0500  Weight: 73.4 kg 74.6 kg 76.4 kg    Examination: General exam: Appears comfortable  HEENT: PERRLA, oral mucosa moist, no sclera icterus or thrush Respiratory system: Clear to auscultation. Respiratory effort normal. Cardiovascular system: S1 & S2 heard,  No murmurs  Gastrointestinal system: Abdomen soft, non-tender,tight and distended today- no bowel sounds   Central nervous system: Alert - non verbal Extremities: No cyanosis, clubbing or edema Skin: No rashes or ulcers     Data Reviewed: I have  personally reviewed following labs and imaging studies  CBC: Recent Labs  Lab 08/25/18 2020 08/26/18 0400 08/27/18 0515 08/28/18 0442  WBC 11.4* 16.2* 11.7* 11.7*  NEUTROABS  --  15.0*  --  10.1*  HGB 15.3 15.1 12.7* 13.0  HCT 49.4 48.8 40.0 40.9  MCV 93.0 94.4 91.5 91.1  PLT 243 233 204 220   Basic Metabolic Panel: Recent Labs  Lab 08/25/18 2020 08/26/18 0400 08/27/18 0515 08/28/18 0442 08/29/18 0434  NA 136 139 140 144 142  K 4.2 4.5 4.2 3.7 4.0  CL 101 106 108 107 108  CO2 GLUCOSE 164* 194* 131* 122* 139*  BUN 30* 34* 38* 34* 35*  CREATININE  1.16 1.40* 1.92* 1.79* 1.60*  CALCIUM 10.2 9.2 8.9 9.0 8.8*  MG  --   --   --  2.0 2.0  PHOS  --   --   --  3.3 2.3*   GFR: Estimated Creatinine Clearance: 41.3 mL/min (A) (by C-G formula based on SCr of 1.6 mg/dL (H)). Liver Function Tests: Recent Labs  Lab 08/25/18 2020 08/26/18 0400 08/27/18 0515  AST ALT ALKPHOS 79 68 55  BILITOT 0.4 0.2* 0.5  PROT 9.6* 8.0 6.9  ALBUMIN 4.4 3.6 3.0*   Recent Labs  Lab 08/25/18 2020  LIPASE 30   No results for input(s): AMMONIA in the last 168 hours. Coagulation Profile: No results for input(s): INR, PROTIME in the last 168 hours. Cardiac Enzymes: No results for input(s): CKTOTAL, CKMB, CKMBINDEX, TROPONINI in the last 168 hours. BNP (last 3 results) No results for input(s): PROBNP in the last 8760 hours. HbA1C: No results for input(s): HGBA1C in the last 72 hours. CBG: Recent Labs  Lab 08/28/18 2104 08/28/18 2346 08/29/18 0427 08/29/18 0736 08/29/18 1100  GLUCAP 158* 152* 140* 144* 132*   Lipid Profile: No results for input(s): CHOL, HDL, LDLCALC, TRIG, CHOLHDL, LDLDIRECT in the last 72 hours. Thyroid Function Tests: No results for input(s): TSH, T4TOTAL, FREET4, T3FREE, THYROIDAB in the last 72 hours. Anemia Panel: No results for input(s): VITAMINB12, FOLATE, FERRITIN, TIBC, IRON, RETICCTPCT in the last 72 hours. Urine analysis:    Component Value Date/Time   COLORURINE YELLOW 08/25/2018 1958   APPEARANCEUR CLOUDY (A) 08/25/2018 1958   LABSPEC 1.033 (H) 08/25/2018 1958   PHURINE 5.0 08/25/2018 1958   GLUCOSEU NEGATIVE 08/25/2018 1958   HGBUR MODERATE (A) 08/25/2018 1958   BILIRUBINUR NEGATIVE 08/25/2018 1958   KETONESUR NEGATIVE 08/25/2018 1958   PROTEINUR 30 (A) 08/25/2018 1958   UROBILINOGEN 1.0 07/20/2014 1718   NITRITE NEGATIVE 08/25/2018 1958   LEUKOCYTESUR LARGE (A) 08/25/2018 1958   Sepsis Labs: (procalcitonin:4,lacticidven:4) ) Recent Results (from the past 240 hour(s))   SARS Coronavirus 2 (CEPHEID - Performed in [facility] hospital lab), Hosp Order     Status: None   Collection Time: 08/25/18 10:30 PM  Result Value Ref Range Status   SARS Coronavirus 2 NEGATIVE NEGATIVE Final    Comment: (NOTE) If result is NEGATIVE SARS-CoV-2 target nucleic acids are NOT DETECTED. The SARS-CoV-2 RNA is generally detectable in upper and lower  respiratory specimens during the acute phase of infection. The lowest  concentration of SARS-CoV-2 viral copies this assay can detect is 250  copies / mL. A negative result does not preclude SARS-CoV-2 infection  and should not be used as the sole basis for treatment or other  patient management decisions.  A negative result may  occur with  improper specimen collection / handling, submission of specimen other  than nasopharyngeal swab, presence of viral mutation(s) within the  areas targeted by this assay, and inadequate number of viral copies  (<250 copies / mL). A negative result must be combined with clinical  observations, patient history, and epidemiological information. If result is POSITIVE SARS-CoV-2 target nucleic acids are DETECTED. The SARS-CoV-2 RNA is generally detectable in upper and lower  respiratory specimens dur ing the acute phase of infection.  Positive  results are indicative of active infection with SARS-CoV-2.  Clinical  correlation with patient history and other diagnostic information is  necessary to determine patient infection status.  Positive results do  not rule out bacterial infection or co-infection with other viruses. If result is PRESUMPTIVE POSTIVE SARS-CoV-2 nucleic acids MAY BE PRESENT.   A presumptive positive result was obtained on the submitted specimen  and confirmed on repeat testing.  While 2019 novel coronavirus  (SARS-CoV-2) nucleic acids may be present in the submitted sample  additional confirmatory testing may be necessary for epidemiological  and / or clinical management  purposes  to differentiate between  SARS-CoV-2 and other Sarbecovirus currently known to infect humans.  If clinically indicated additional testing with an alternate test  methodology [phone]) is advised. The SARS-CoV-2 RNA is generally  detectable in upper and lower respiratory sp ecimens during the acute  phase of infection. The expected result is Negative. Fact Sheet for Patients:  BoilerBrush.com.cy Fact Sheet for Healthcare Providers: https://pope.com/ This test is not yet approved or cleared by the [place] FDA and has been authorized for detection and/or diagnosis of SARS-CoV-2 by FDA under an Emergency Use Authorization (EUA).  This EUA will remain in effect (meaning this test can be used) for the duration of the COVID-19 declaration under Section 564(b)(1) of the Act, 21 U.S.C. section 360bbb-3(b)(1), unless the authorization is terminated or revoked sooner. Performed at [facility], [address] [place], [place] [address]   MRSA PCR Screening     Status: None   Collection Time: 08/26/18  2:44 AM  Result Value Ref Range Status   MRSA by PCR NEGATIVE NEGATIVE Final    Comment:        The GeneXpert MRSA Assay (FDA approved for NASAL specimens only), is one component of a comprehensive MRSA colonization surveillance program. It is not intended to diagnose MRSA infection nor to guide or monitor treatment for MRSA infections. Performed at [facility], [address], [place], [place] [address]      Radiology Studies: Dg Abd Portable 2v  Result Date: 08/27/2018 CLINICAL DATA:  Ileus. Recent exploratory laparotomy with reduction of small bowel volvulus. EXAM: PORTABLE ABDOMEN - 2 VIEW COMPARISON:  CT abdomen and pelvis 08/25/2018 FINDINGS: Intraperitoneal free air is consistent with recent surgery. A percutaneous cholecystostomy tube is again noted in the right upper abdomen. There is prominent gaseous dilatation of  multiple small bowel loops in the upper and mid abdomen. Stool is present in nondilated colon and rectum. There is also moderate gaseous distension of the stomach. Skin staples and prostate fiducials are noted. The visualized lung bases are grossly clear within limitations of motion artifact. IMPRESSION: Prominent small bowel dilatation which may reflect postoperative ileus or recurrent obstruction. Electronically Signed   By: Sebastian Ache M.D.   On: 08/27/2018 17:17      Scheduled Meds: . bisacodyl  10 mg Rectal BID  . heparin injection (subcutaneous)  5,000 Units Subcutaneous Q8H  . insulin aspart  0-9 Units Subcutaneous Q4H  . sodium chloride flush  3 mL Intravenous Once  . sodium chloride flush  3 mL Intravenous Q12H   Continuous Infusions: . lactated ringers 125 mL/hr at 08/29/18 1100     LOS: 4 days      Calvert CantorSaima Dijon Cosens, MD [facility] Pager: [email or web address] Password [email or web address] 08/29/2018, 11:48 AM

## 2018-08-29 NOTE — Progress Notes (Signed)
[facility] Progress Note  4 Days Post-Op  Subjective: Distended. No real Bm with suppository. Possibly some flatus smelled per the family in the room. No other major issues. PT working with him today. RN unable to get him to the chair yesterday.   Objective: Vital signs in last 24 hours: Temp:  [97.8 F (36.6 C)-98.9 F (37.2 C)] 97.8 F (36.6 C) (05/15 1126) Pulse Rate:  [94-104] 97 (05/15 0700) Resp:  [12-24] 21 (05/15 0700) BP: (96-146)/(57-84) 124/61 (05/15 0700) SpO2:  [79 %-93 %] 89 % (05/15 0700) Weight:  [76.4 kg] 76.4 kg (05/15 0500) Last BM Date: 08/28/18  Intake/Output from previous day: 05/14 0701 - 05/15 0700 In: 2964.5 [I.V.:2949.2; IV Piggyback:15.3] Out: -  Intake/Output this shift: Total I/O In: 324.7 [I.V.:324.7] Out: -   General appearance: alert and no distress Resp: normal work of breathing GI: distended, tympanic, staples c/d/i with some minor drainage, dressing removed, no erythema ABD reapplied   Lab Results:  Recent Labs    08/27/18 0515 08/28/18 0442  WBC 11.7* 11.7*  HGB 12.7* 13.0  HCT 40.0 40.9  PLT 204 220   BMET Recent Labs    08/28/18 0442 08/29/18 0434  NA 144 142  K 3.7 4.0  CL 107 108  CO2 25 23  GLUCOSE 122* 139*  BUN 34* 35*  CREATININE 1.79* 1.60*  CALCIUM 9.0 8.8*    Studies/Results: Dg Abd Portable 2v  Result Date: 08/27/2018 CLINICAL DATA:  Ileus. Recent exploratory laparotomy with reduction of small bowel volvulus. EXAM: PORTABLE ABDOMEN - 2 VIEW COMPARISON:  CT abdomen and pelvis 08/25/2018 FINDINGS: Intraperitoneal free air is consistent with recent surgery. A percutaneous cholecystostomy tube is again noted in the right upper abdomen. There is prominent gaseous dilatation of multiple small bowel loops in the upper and mid abdomen. Stool is present in nondilated colon and rectum. There is also moderate gaseous distension of the stomach. Skin staples and prostate fiducials are noted. The  visualized lung bases are grossly clear within limitations of motion artifact. IMPRESSION: Prominent small bowel dilatation which may reflect postoperative ileus or recurrent obstruction. Electronically Signed   By: [name] M.D.   On: 08/27/2018 17:17    Anti-infectives: Anti-infectives (From admission, onward)   Start     Dose/Rate Route Frequency Ordered Stop   08/26/18 1100  cefoTEtan (CEFOTAN) 2 g in sodium chloride 0.9 % 100 mL IVPB     2 g 200 mL/hr over 30 Minutes Intravenous Every 12 hours 08/26/18 0301 08/28/18 0454   08/26/18 0600  piperacillin-tazobactam (ZOSYN) IVPB 3.375 g  Status:  Discontinued     3.375 g 12.5 mL/hr over 240 Minutes Intravenous Every 8 hours 08/25/18 2347 08/26/18 0301   08/26/18 0000  piperacillin-tazobactam (ZOSYN) IVPB 3.375 g  Status:  Discontinued     3.375 g 100 mL/hr over 30 Minutes Intravenous  Once 08/25/18 2347 08/26/18 0301      Assessment/Plan: [name] is a 68 yo POD4s/p Ex lap, reduction of volvulus for a high grade small bowel obstruction. Now with ileus and very distended. No vomiting.  -PRN For pain fentanyl -OOB to chair today with PT working with him  -NPO and ice for comfort  -Suppository BID -Cholecystomy tube in place, draining  -Keep binder in place -AKI improving, Foley in place, lytes reviewed -SCDs,heparin sq -Patient remains distended, will hopefully improve in next few days but if does not will have to think about TPN, will also consider SBFT with water soluble  contrast if no improvement by Monday (Radiologist should be available Monday)    LOS: 4 days    [name] 08/29/2018

## 2018-08-29 NOTE — Plan of Care (Deleted)
  Problem: Acute Rehab PT Goals(only PT should resolve) Goal: Pt Will Go Supine/Side To Sit 08/29/2018 1303 by Ocie Bob, PT Outcome: Progressing Flowsheets (Taken 08/29/2018 1303) Pt will go Supine/Side to Sit: with min guard assist 08/29/2018 1302 by Ocie Bob, PT Outcome: Progressing Flowsheets (Taken 08/29/2018 1302) Pt will go Supine/Side to Sit: with min guard assist Goal: Patient Will Transfer Sit To/From Stand 08/29/2018 1303 by Ocie Bob, PT Outcome: Progressing Flowsheets (Taken 08/29/2018 1303) Patient will transfer sit to/from stand: with min guard assist 08/29/2018 1302 by Ocie Bob, PT Outcome: Progressing Flowsheets (Taken 08/29/2018 1302) Patient will transfer sit to/from stand: with min guard assist Goal: Pt Will Transfer Bed To Chair/Chair To Bed 08/29/2018 1303 by Ocie Bob, PT Outcome: Progressing Flowsheets (Taken 08/29/2018 1303) Pt will Transfer Bed to Chair/Chair to Bed: min guard assist 08/29/2018 1302 by Ocie Bob, PT Outcome: Progressing Flowsheets (Taken 08/29/2018 1302) Pt will Transfer Bed to Chair/Chair to Bed: min guard assist Goal: Pt Will Ambulate 08/29/2018 1303 by Ocie Bob, PT Outcome: Progressing Flowsheets (Taken 08/29/2018 1303) Pt will Ambulate: 50 feet; with minimal assist; with rolling walker 08/29/2018 1302 by Ocie Bob, PT Outcome: Progressing Flowsheets (Taken 08/29/2018 1302) Pt will Ambulate: 50 feet;with minimal assist;with rolling walker   1:04 PM, 08/29/18 Ocie Bob, MPT Physical Therapist with [facility] [phone] office [phone] mobile phone

## 2018-08-29 NOTE — Evaluation (Signed)
Physical Therapy Evaluation Patient Details Name: [name] MRN: [id number] DOB: 09-18-1950 Today's Date: 08/29/2018   History of Present Illness   [name] is a 68 y.o. male s/p Exploratory laparotomy with reduction of volvulus 08/26/18 with medical history significant for intellectual disability, type 2 diabetes mellitus, BPH, and admission in February for acute gangrenous cholecystitis now with percutaneous drain in place, presenting to the emergency department for evaluation of abdominal pain, anorexia, nausea, and nonbloody vomiting.  Patient is nonverbal and is accompanied by his caretaker who provides the history.  He has been rubbing his abdomen and grimacing today after a few days of progressive abdominal distention.  He is not eating or drinking anything today despite encouragement to do so, and has now developed right current vomiting.  His last bowel movement was 4 days ago.  Patient had a small bowel obstruction during his admission in February that fortunately resolved with conservative management.  Per report of his caretaker, he has not been coughing and has not appeared to be short of breath.    Clinical Impression  Patient demonstrates slow labored movement for sitting up at bedside requiring assistance to help pull self up to sitting, limited to a few steps at bedside secondary to fair/poor standing balance and increased abdominal pain when attempting to fully extend trunk, SpO2 dropped below 85% on room air and put back on 2 LPM to bring SpO2 above 90%.  Patient had to sit in chair right away due to weakness and tolerated marching in place for approximately 2 minutes before having to sit down due to fatigue.  Patient tolerated sitting up in chair with his Aunt present in room after therapy.  Patient will benefit from continued physical therapy in hospital and recommended venue below to increase strength, balance, endurance for safe ADLs and gait.    Follow Up  Recommendations SNF;Supervision/Assistance - 24 hour;Supervision for mobility/OOB    Equipment Recommendations  None recommended by PT    Recommendations for Other Services       Precautions / Restrictions Precautions Precautions: Fall Restrictions Weight Bearing Restrictions: No      Mobility  Bed Mobility Overal bed mobility: Needs Assistance Bed Mobility: Supine to Sit     Supine to sit: Min assist     General bed mobility comments: labored movement, increased time  Transfers Overall transfer level: Needs assistance Equipment used: Rolling walker (2 wheeled) Transfers: Sit to/from UGI CorporationStand;Stand Pivot Transfers Sit to Stand: Min assist Stand pivot transfers: Min assist       General transfer comment: slow labored movement  Ambulation/Gait Ambulation/Gait assistance: Mod assist Gait Distance (Feet): 4 Feet Assistive device: Rolling walker (2 wheeled) Gait Pattern/deviations: Decreased step length - right;Decreased step length - left;Decreased stride length Gait velocity: slow   General Gait Details: limited to 5-6 slow unsteady steps at bedside with diffiuculty extending trunk due to abdominal discomfort  Stairs            Wheelchair Mobility    Modified Rankin (Stroke Patients Only)       Balance Overall balance assessment: Needs assistance Sitting-balance support: Feet supported;No upper extremity supported Sitting balance-[name]: Fair     Standing balance support: Bilateral upper extremity supported;During functional activity Standing balance-[name]: Fair Standing balance comment: using RW                             Pertinent Vitals/Pain Pain Assessment: Faces Faces Pain Scale:  Hurts little more Pain Location: abdominal area at site of surgery Pain Descriptors / Indicators: Aching;Discomfort Pain Intervention(s): Limited activity within patient's tolerance;Monitored during session    Home Living Family/patient  expects to be discharged to:: Private residence Living Arrangements: Other relatives Available Help at Discharge: Family;Available 24 hours/day Type of Home: Mobile home Home Access: Stairs to enter Entrance Stairs-Rails: Right;Left;Can reach both Entrance Stairs-Number of Steps: 3-4 Home Layout: One level Home Equipment: Walker - 2 wheels;Wheelchair - manual      Prior Function Level of Independence: Independent with assistive device(s)         Comments: household and short distanced community ambulator with RW     Hand Dominance        Extremity/Trunk Assessment   Upper Extremity Assessment Upper Extremity Assessment: Generalized weakness    Lower Extremity Assessment Lower Extremity Assessment: Generalized weakness    Cervical / Trunk Assessment Cervical / Trunk Assessment: Kyphotic  Communication   Communication: Other (comment)(Patient speaks very softly)  Cognition Arousal/Alertness: Awake/alert Behavior During Therapy: WFL for tasks assessed/performed Overall Cognitive Status: History of cognitive impairments - at baseline                                 General Comments: follows directions consistently requiring occasional repeated verbal/tactile cueing      General Comments      Exercises     Assessment/Plan    PT Assessment Patient needs continued PT services  PT Problem List Decreased strength;Decreased activity tolerance;Decreased balance;Decreased mobility       PT Treatment Interventions Therapeutic exercise;Gait training;Stair training;Functional mobility training;Therapeutic activities;Patient/family education    PT Goals (Current goals can be found in the Care Plan section)  Acute Rehab PT Goals Patient Stated Goal: Return home with family to assist PT Goal Formulation: With patient/family Time For Goal Achievement: 09/12/18 Potential to Achieve Goals: Good    Frequency Min 5X/week   Barriers to discharge         Co-evaluation               AM-PAC PT "6 Clicks" Mobility  Outcome Measure Help needed turning from your back to your side while in a flat bed without using bedrails?: A Little Help needed moving from lying on your back to sitting on the side of a flat bed without using bedrails?: A Lot Help needed moving to and from a bed to a chair (including a wheelchair)?: A Lot Help needed standing up from a chair using your arms (e.g., wheelchair or bedside chair)?: A Lot Help needed to walk in hospital room?: A Lot Help needed climbing 3-5 steps with a railing? : A Lot 6 Click Score: 13    End of Session Equipment Utilized During Treatment: Oxygen Activity Tolerance: Patient tolerated treatment well;Patient limited by pain Patient left: in chair;with call bell/phone within reach;with family/visitor present Nurse Communication: Mobility status PT Visit Diagnosis: Unsteadiness on feet (R26.81);Other abnormalities of gait and mobility (R26.89);Muscle weakness (generalized) (M62.81)    Time: 4196-2229 PT Time Calculation (min) (ACUTE ONLY): 31 min   Charges:   PT Evaluation $PT Eval Moderate Complexity: 1 Mod PT Treatments $Therapeutic Activity: 23-37 mins        1:00 PM, 08/29/18 Ocie Bob, MPT Physical Therapist with [facility] [phone] office [phone] mobile phone

## 2018-08-30 ENCOUNTER — Encounter (HOSPITAL_COMMUNITY): Payer: Self-pay | Admitting: Radiology

## 2018-08-30 ENCOUNTER — Inpatient Hospital Stay (HOSPITAL_COMMUNITY): Payer: Medicare Other

## 2018-08-30 DIAGNOSIS — K567 Ileus, unspecified: Secondary | ICD-10-CM | POA: Diagnosis not present

## 2018-08-30 DIAGNOSIS — F79 Unspecified intellectual disabilities: Secondary | ICD-10-CM | POA: Diagnosis not present

## 2018-08-30 DIAGNOSIS — K56609 Unspecified intestinal obstruction, unspecified as to partial versus complete obstruction: Secondary | ICD-10-CM | POA: Diagnosis not present

## 2018-08-30 DIAGNOSIS — E118 Type 2 diabetes mellitus with unspecified complications: Secondary | ICD-10-CM | POA: Diagnosis not present

## 2018-08-30 LAB — BASIC METABOLIC PANEL
Anion gap: 11 (ref 5–15)
BUN: 34 mg/dL — ABNORMAL HIGH (ref 8–23)
CO2: 24 mmol/L (ref 22–32)
Calcium: 8.9 mg/dL (ref 8.9–10.3)
Chloride: 109 mmol/L (ref 98–111)
Creatinine, Ser: 1.4 mg/dL — ABNORMAL HIGH (ref 0.61–1.24)
GFR calc Af Amer: 59 mL/min — ABNORMAL LOW (ref >60)
GFR calc non Af Amer: 51 mL/min — ABNORMAL LOW (ref >60)
Glucose, Bld: 123 mg/dL — ABNORMAL HIGH (ref 70–99)
Potassium: 3.7 mmol/L (ref 3.5–5.1)
Sodium: 144 mmol/L (ref 135–145)

## 2018-08-30 LAB — PHOSPHORUS: Phosphorus: 3.3 mg/dL (ref 2.5–4.6)

## 2018-08-30 LAB — GLUCOSE, CAPILLARY
Glucose-Capillary: 105 mg/dL — ABNORMAL HIGH (ref 70–99)
Glucose-Capillary: 106 mg/dL — ABNORMAL HIGH (ref 70–99)
Glucose-Capillary: 109 mg/dL — ABNORMAL HIGH (ref 70–99)
Glucose-Capillary: 115 mg/dL — ABNORMAL HIGH (ref 70–99)
Glucose-Capillary: 118 mg/dL — ABNORMAL HIGH (ref 70–99)
Glucose-Capillary: 121 mg/dL — ABNORMAL HIGH (ref 70–99)

## 2018-08-30 LAB — MAGNESIUM: Magnesium: 1.9 mg/dL (ref 1.7–2.4)

## 2018-08-30 MED ORDER — PHENOL 1.4 % MT LIQD: 1.0000 | OROMUCOSAL | Status: DC | PRN

## 2018-08-30 NOTE — Progress Notes (Signed)
Per verbal order by Dr. Henreitta Leber, NG 18Fr inserted by Dr. Henreitta Leber and connected to low-intermittent suction. 1000 cc output within 1 minute of insertion noted. Abdomen noted to be less distended. Per Dr. Henreitta Leber verbal order, pt has bilateral soft wrist restraints and safety mitts in place as pt has history of pulling NG tube out. Per Dr. Henreitta Leber, should the pt pull the NG tube out, it would benefit pt to have NG re-inserted.

## 2018-08-30 NOTE — Progress Notes (Signed)
PROGRESS NOTE    [name]   ZOX:[id number]  DOB: [birth year]  DOA: 08/25/2018 PCP: [name], [name], MD   Brief Narrative:  [name]  is a 68 y.o. male with medical history significant for intellectual disability, type 2 diabetes mellitus, BPH, and admission in February for acute gangrenous cholecystitis now with percutaneous drain in place, presenting to the emergency department for evaluation of abdominal pain, anorexia, nausea, and nonbloody vomiting. CT abdomen/pelvis is concerning for high-grade small bowel obstruction with pneumatosis of mesenteric vessels concerning for ischemia.   He was taken to the OR and was found to have a volvulus which was reduced.   Subjective: He has no complaints.   Assessment & Plan:   Principal Problem:   SBO (small bowel obstruction) due to volvulus s/p reduction - still has ileus- has pulled out NG tube and for now, General surgery has decided to leave it out - abdomen still remains distended today with poor bowel sounds - cont NPO and IVF- appreciate management per general sugery    Active Problems: AKI - likely due to vomiting and obstruction- cont IVF until adequately able to take orals    Intellectual disability - aunt sitting at bedside to help him remain calm    Diabetes mellitus type 2, controlled - on SSI QID    Gall bladder disease - has a cholecystostomy drain    Time spent in minutes: 35 DVT prophylaxis: Heparin Code Status: Full code Family Communication: Aunt at bedside Disposition Plan:  Awaiting ileus to resolve Consultants:   General surgery Procedures:  Exploratory laparotomy with reduction of volvulus  Antimicrobials:  Anti-infectives (From admission, onward)   Start     Dose/Rate Route Frequency Ordered Stop   08/26/18 1100  cefoTEtan (CEFOTAN) 2 g in sodium chloride 0.9 % 100 mL IVPB     2 g 200 mL/hr over 30 Minutes Intravenous Every 12 hours 08/26/18 0301 08/28/18 0454   08/26/18 0600   piperacillin-tazobactam (ZOSYN) IVPB 3.375 g  Status:  Discontinued     3.375 g 12.5 mL/hr over 240 Minutes Intravenous Every 8 hours 08/25/18 2347 08/26/18 0301   08/26/18 0000  piperacillin-tazobactam (ZOSYN) IVPB 3.375 g  Status:  Discontinued     3.375 g 100 mL/hr over 30 Minutes Intravenous  Once 08/25/18 2347 08/26/18 0301     Objective: Vitals:   08/30/18 0753 08/30/18 0800 08/30/18 1100 08/30/18 1131  BP:  (!) 141/91 (!) 124/93   Pulse:  (!) 106 97   Resp:  (!) 23 (!) 21   Temp: 98.1 F (36.7 C)   98.8 F (37.1 C)  TempSrc: Oral   Axillary  SpO2:  94% 91%   Weight:      Height:        Intake/Output Summary (Last 24 hours) at 08/30/2018 1142 Last data filed at 08/30/2018 1139 Gross per 24 hour  Intake 2725.04 ml  Output 1780 ml  Net 945.04 ml   Filed Weights   08/28/18 0500 08/29/18 0500 08/30/18 0500  Weight: 74.6 kg 76.4 kg 76.4 kg    Examination: General exam: Appears comfortable  HEENT: PERRLA, oral mucosa moist, no sclera icterus or thrush Respiratory system: Clear to auscultation. Respiratory effort normal. Cardiovascular system: S1 & S2 heard,  No murmurs  Gastrointestinal system: Abdomen soft, non-tender, still very distended. No  bowel sounds   Central nervous system: Alert and oriented. No focal neurological deficits. Extremities: No cyanosis, clubbing or edema Skin: No rashes or ulcers Psychiatry:  Mood & affect appropriate.      Data Reviewed: I have personally reviewed following labs and imaging studies  CBC: Recent Labs  Lab 08/25/18 2020 08/26/18 0400 08/27/18 0515 08/28/18 0442  WBC 11.4* 16.2* 11.7* 11.7*  NEUTROABS  --  15.0*  --  10.1*  HGB 15.3 15.1 12.7* 13.0  HCT 49.4 48.8 40.0 40.9  MCV 93.0 94.4 91.5 91.1  PLT 243 233 204 220   Basic Metabolic Panel: Recent Labs  Lab 08/26/18 0400 08/27/18 0515 08/28/18 0442 08/29/18 0434 08/30/18 0452  NA 139 140 144 142 144  K 4.5 4.2 3.7 4.0 3.7  CL 106 108 107 108 109  CO2 GLUCOSE 194* 131* 122* 139* 123*  BUN 34* 38* 34* 35* 34*  CREATININE 1.40* 1.92* 1.79* 1.60* 1.40*  CALCIUM 9.2 8.9 9.0 8.8* 8.9  MG  --   --  2.0 2.0 1.9  PHOS  --   --  3.3 2.3* 3.3   GFR: Estimated Creatinine Clearance: 47.2 mL/min (A) (by C-G formula based on SCr of 1.4 mg/dL (H)). Liver Function Tests: Recent Labs  Lab 08/25/18 2020 08/26/18 0400 08/27/18 0515  AST ALT ALKPHOS 79 68 55  BILITOT 0.4 0.2* 0.5  PROT 9.6* 8.0 6.9  ALBUMIN 4.4 3.6 3.0*   Recent Labs  Lab 08/25/18 2020  LIPASE 30   No results for input(s): AMMONIA in the last 168 hours. Coagulation Profile: No results for input(s): INR, PROTIME in the last 168 hours. Cardiac Enzymes: No results for input(s): CKTOTAL, CKMB, CKMBINDEX, TROPONINI in the last 168 hours. BNP (last 3 results) No results for input(s): PROBNP in the last 8760 hours. HbA1C: No results for input(s): HGBA1C in the last 72 hours. CBG: Recent Labs  Lab 08/29/18 1943 08/30/18 0010 08/30/18 0352 08/30/18 0756 08/30/18 1119  GLUCAP 148* 115* 105* 121* 106*   Lipid Profile: No results for input(s): CHOL, HDL, LDLCALC, TRIG, CHOLHDL, LDLDIRECT in the last 72 hours. Thyroid Function Tests: No results for input(s): TSH, T4TOTAL, FREET4, T3FREE, THYROIDAB in the last 72 hours. Anemia Panel: No results for input(s): VITAMINB12, FOLATE, FERRITIN, TIBC, IRON, RETICCTPCT in the last 72 hours. Urine analysis:    Component Value Date/Time   COLORURINE YELLOW 08/25/2018 1958   APPEARANCEUR CLOUDY (A) 08/25/2018 1958   LABSPEC 1.033 (H) 08/25/2018 1958   PHURINE 5.0 08/25/2018 1958   GLUCOSEU NEGATIVE 08/25/2018 1958   HGBUR MODERATE (A) 08/25/2018 1958   BILIRUBINUR NEGATIVE 08/25/2018 1958   KETONESUR NEGATIVE 08/25/2018 1958   PROTEINUR 30 (A) 08/25/2018 1958   UROBILINOGEN 1.0 07/20/2014 1718   NITRITE NEGATIVE 08/25/2018 1958   LEUKOCYTESUR LARGE (A) 08/25/2018 1958   Sepsis Labs:  (procalcitonin:4,lacticidven:4) ) Recent Results (from the past 240 hour(s))  SARS Coronavirus 2 (CEPHEID - Performed in [facility] hospital lab), Hosp Order     Status: None   Collection Time: 08/25/18 10:30 PM  Result Value Ref Range Status   SARS Coronavirus 2 NEGATIVE NEGATIVE Final    Comment: (NOTE) If result is NEGATIVE SARS-CoV-2 target nucleic acids are NOT DETECTED. The SARS-CoV-2 RNA is generally detectable in upper and lower  respiratory specimens during the acute phase of infection. The lowest  concentration of SARS-CoV-2 viral copies this assay can detect is 250  copies / mL. A negative result does not preclude SARS-CoV-2 infection  and should not be used as the sole basis for treatment or other  patient management decisions.  A negative result may occur with  improper specimen collection / handling, submission of specimen other  than nasopharyngeal swab, presence of viral mutation(s) within the  areas targeted by this assay, and inadequate number of viral copies  (<250 copies / mL). A negative result must be combined with clinical  observations, patient history, and epidemiological information. If result is POSITIVE SARS-CoV-2 target nucleic acids are DETECTED. The SARS-CoV-2 RNA is generally detectable in upper and lower  respiratory specimens dur ing the acute phase of infection.  Positive  results are indicative of active infection with SARS-CoV-2.  Clinical  correlation with patient history and other diagnostic information is  necessary to determine patient infection status.  Positive results do  not rule out bacterial infection or co-infection with other viruses. If result is PRESUMPTIVE POSTIVE SARS-CoV-2 nucleic acids MAY BE PRESENT.   A presumptive positive result was obtained on the submitted specimen  and confirmed on repeat testing.  While 2019 novel coronavirus  (SARS-CoV-2) nucleic acids may be present in the submitted sample  additional  confirmatory testing may be necessary for epidemiological  and / or clinical management purposes  to differentiate between  SARS-CoV-2 and other Sarbecovirus currently known to infect humans.  If clinically indicated additional testing with an alternate test  methodology [phone]) is advised. The SARS-CoV-2 RNA is generally  detectable in upper and lower respiratory sp ecimens during the acute  phase of infection. The expected result is Negative. Fact Sheet for Patients:  BoilerBrush.com.cy Fact Sheet for Healthcare Providers: https://pope.com/ This test is not yet approved or cleared by the [place] FDA and has been authorized for detection and/or diagnosis of SARS-CoV-2 by FDA under an Emergency Use Authorization (EUA).  This EUA will remain in effect (meaning this test can be used) for the duration of the COVID-19 declaration under Section 564(b)(1) of the Act, 21 U.S.C. section 360bbb-3(b)(1), unless the authorization is terminated or revoked sooner. Performed at [facility], [address], [place], [place] [address]   MRSA PCR Screening     Status: None   Collection Time: 08/26/18  2:44 AM  Result Value Ref Range Status   MRSA by PCR NEGATIVE NEGATIVE Final    Comment:        The GeneXpert MRSA Assay (FDA approved for NASAL specimens only), is one component of a comprehensive MRSA colonization surveillance program. It is not intended to diagnose MRSA infection nor to guide or monitor treatment for MRSA infections. Performed at [facility], [address] [place], [place] [address]      Radiology Studies: No results found.    Scheduled Meds: . bisacodyl  10 mg Rectal BID  . heparin injection (subcutaneous)  5,000 Units Subcutaneous Q8H  . insulin aspart  0-9 Units Subcutaneous Q4H  . sodium chloride flush  3 mL Intravenous Once  . sodium chloride flush  3 mL Intravenous Q12H   Continuous Infusions: .  lactated ringers 125 mL/hr (08/30/18 0208)     LOS: 5 days      Calvert Cantor, MD [facility] Pager: [email or web address] Password [email or web address] 08/30/2018, 11:42 AM

## 2018-08-30 NOTE — Progress Notes (Signed)
[facility] Progress Note  5 Days Post-Op  Subjective: Distended more today. PT worked with him yesterday but still no bowel function.   Objective: Vital signs in last 24 hours: Temp:  [97.8 F (36.6 C)-98.8 F (37.1 C)] 98.8 F (37.1 C) (05/16 1131) Pulse Rate:  [84-106] 97 (05/16 1100) Resp:  [12-24] 21 (05/16 1100) BP: (105-141)/(65-93) 124/93 (05/16 1100) SpO2:  [86 %-98 %] 91 % (05/16 1100) Weight:  [76.4 kg] 76.4 kg (05/16 0500) Last BM Date: 08/29/18  Intake/Output from previous day: 05/15 0701 - 05/16 0700 In: 3049.8 [I.V.:2789.3; IV Piggyback:260.5] Out: 780 [Urine:700; Drains:80] Intake/Output this shift: Total I/O In: -  Out: 1000 [Emesis/NG output:1000]  General appearance: alert and mild distress Resp: normal work of breathing GI: soft, distended, no rebound or guarding, non tender  Lab Results:  Recent Labs    08/28/18 0442  WBC 11.7*  HGB 13.0  HCT 40.9  PLT 220   BMET Recent Labs    08/29/18 0434 08/30/18 0452  NA 142 144  K 4.0 3.7  CL 108 109  CO2 23 24  GLUCOSE 139* 123*  BUN 35* 34*  CREATININE 1.60* 1.40*  CALCIUM 8.8* 8.9    Anti-infectives: Anti-infectives (From admission, onward)   Start     Dose/Rate Route Frequency Ordered Stop   08/26/18 1100  cefoTEtan (CEFOTAN) 2 g in sodium chloride 0.9 % 100 mL IVPB     2 g 200 mL/hr over 30 Minutes Intravenous Every 12 hours 08/26/18 0301 08/28/18 0454   08/26/18 0600  piperacillin-tazobactam (ZOSYN) IVPB 3.375 g  Status:  Discontinued     3.375 g 12.5 mL/hr over 240 Minutes Intravenous Every 8 hours 08/25/18 2347 08/26/18 0301   08/26/18 0000  piperacillin-tazobactam (ZOSYN) IVPB 3.375 g  Status:  Discontinued     3.375 g 100 mL/hr over 30 Minutes Intravenous  Once 08/25/18 2347 08/26/18 0301      Assessment/Plan: [name] is a 68 yo POD5 s/p Ex lap, reduction of volvulus for a high grade small bowel obstruction.Now with ileus and very distended. NG  replaced.   -PRN For pain -OOB to chairtoday with PT working with him  -NPOand NG Placed  -Suppository BID -Cholecystomy tube in place, draining -Keep binder in place -AKI improving,Foley in place,lytes reviewed -SCDs,heparin sq -Patient remains distended, not improving, NG in place, plan for SBFT with water soluble contrast if no improvement by Monday (Radiologist should be available Monday)   LOS: 5 days    [name] 08/30/2018

## 2018-08-30 NOTE — Progress Notes (Signed)
Physical Therapy Treatment Patient Details Name: [name] MRN: [id number] DOB: 09/06/1950 Today's Date: 08/30/2018    History of Present Illness  [name] is a 68 y.o. male s/p Exploratory laparotomy with reduction of volvulus 08/26/18 with medical history significant for intellectual disability, type 2 diabetes mellitus, BPH, and admission in February for acute gangrenous cholecystitis now with percutaneous drain in place, presenting to the emergency department for evaluation of abdominal pain, anorexia, nausea, and nonbloody vomiting.  Patient is nonverbal and is accompanied by his caretaker who provides the history.  He has been rubbing his abdomen and grimacing today after a few days of progressive abdominal distention.  He is not eating or drinking anything today despite encouragement to do so, and has now developed right current vomiting.  His last bowel movement was 4 days ago.  Patient had a small bowel obstruction during his admission in February that fortunately resolved with conservative management.  Per report of his caretaker, he has not been coughing and has not appeared to be short of breath.    PT Comments    Patient had to have NGT put back in due to abdominal distension, demonstrates slight improvement for sitting up at bedside with less assistance, limited for taking steps at bedside due to BLE weakness and fatigue and required active assistance to complete BLE ROM/strengthening exercises while seated at bedside.  Patient tolerated sitting up in chair with BUE restraints/mittins reapplied and family member present in room after therapy - RN aware.  Patient will benefit from continued physical therapy in hospital and recommended venue below to increase strength, balance, endurance for safe ADLs and gait.   Follow Up Recommendations  SNF;Supervision/Assistance - 24 hour;Supervision for mobility/OOB     Equipment Recommendations  None recommended by PT     Recommendations for Other Services       Precautions / Restrictions Restrictions Weight Bearing Restrictions: No    Mobility  Bed Mobility Overal bed mobility: Needs Assistance Bed Mobility: Supine to Sit     Supine to sit: Min guard;Min assist     General bed mobility comments: slightly labored movement, increased time  Transfers Overall transfer level: Needs assistance Equipment used: Rolling walker (2 wheeled) Transfers: Sit to/from UGI CorporationStand;Stand Pivot Transfers Sit to Stand: Min assist Stand pivot transfers: Min assist       General transfer comment: slow labored movement  Ambulation/Gait Ambulation/Gait assistance: Min assist;Mod assist Gait Distance (Feet): 4 Feet Assistive device: Rolling walker (2 wheeled) Gait Pattern/deviations: Decreased step length - right;Decreased step length - left;Decreased stride length Gait velocity: slow   General Gait Details: limited to 5-6 slow unsteady steps at bedside with diffiuculty extending trunk due to abdominal discomfort   Stairs             Wheelchair Mobility    Modified Rankin (Stroke Patients Only)       Balance Overall balance assessment: Needs assistance Sitting-balance support: Feet supported;No upper extremity supported Sitting balance-[name]: Fair     Standing balance support: Bilateral upper extremity supported;During functional activity Standing balance-[name]: Fair Standing balance comment: using RW                            Cognition Arousal/Alertness: Awake/alert Behavior During Therapy: WFL for tasks assessed/performed Overall Cognitive Status: History of cognitive impairments - at baseline  General Comments: follows directions consistently requiring occasional repeated verbal/tactile cueing      Exercises General Exercises - Lower Extremity Ankle Circles/Pumps: Seated;AROM;Strengthening;Both;5 reps Long Arc  Quad: Seated;AAROM;Strengthening;Both;10 reps Hip ABduction/ADduction: Seated;AAROM;Strengthening;Both;10 reps    General Comments        Pertinent Vitals/Pain Pain Assessment: Faces Faces Pain Scale: Hurts a little bit Pain Location: abdominal area at site of surgery Pain Descriptors / Indicators: Aching;Discomfort Pain Intervention(s): Limited activity within patient's tolerance;Monitored during session    Home Living                      Prior Function            PT Goals (current goals can now be found in the care plan section) Acute Rehab PT Goals Patient Stated Goal: Return home with family to assist PT Goal Formulation: With patient/family Time For Goal Achievement: 09/12/18 Potential to Achieve Goals: Good Progress towards PT goals: Progressing toward goals    Frequency    Min 5X/week      PT Plan Current plan remains appropriate    Co-evaluation              AM-PAC PT "6 Clicks" Mobility   Outcome Measure  Help needed turning from your back to your side while in a flat bed without using bedrails?: A Little Help needed moving from lying on your back to sitting on the side of a flat bed without using bedrails?: A Little Help needed moving to and from a bed to a chair (including a wheelchair)?: A Lot Help needed standing up from a chair using your arms (e.g., wheelchair or bedside chair)?: A Little Help needed to walk in hospital room?: A Lot Help needed climbing 3-5 steps with a railing? : A Lot 6 Click Score: 15    End of Session   Activity Tolerance: Patient tolerated treatment well Patient left: in chair;with call bell/phone within reach;with family/visitor present Nurse Communication: Mobility status PT Visit Diagnosis: Unsteadiness on feet (R26.81);Other abnormalities of gait and mobility (R26.89);Muscle weakness (generalized) (M62.81)     Time: 2778-2423 PT Time Calculation (min) (ACUTE ONLY): 24 min  Charges:  $Therapeutic  Exercise: 8-22 mins $Therapeutic Activity: 8-22 mins                     12:44 PM, 08/30/18 Ocie Bob, MPT Physical Therapist with [facility] 336 [phone] office [phone] mobile phone

## 2018-08-31 DIAGNOSIS — E118 Type 2 diabetes mellitus with unspecified complications: Secondary | ICD-10-CM | POA: Diagnosis not present

## 2018-08-31 DIAGNOSIS — K56609 Unspecified intestinal obstruction, unspecified as to partial versus complete obstruction: Secondary | ICD-10-CM | POA: Diagnosis not present

## 2018-08-31 DIAGNOSIS — F79 Unspecified intellectual disabilities: Secondary | ICD-10-CM | POA: Diagnosis not present

## 2018-08-31 DIAGNOSIS — K567 Ileus, unspecified: Secondary | ICD-10-CM | POA: Diagnosis not present

## 2018-08-31 LAB — BASIC METABOLIC PANEL
Anion gap: 10 (ref 5–15)
BUN: 28 mg/dL — ABNORMAL HIGH (ref 8–23)
CO2: 26 mmol/L (ref 22–32)
Calcium: 8.4 mg/dL — ABNORMAL LOW (ref 8.9–10.3)
Chloride: 110 mmol/L (ref 98–111)
Creatinine, Ser: 1.26 mg/dL — ABNORMAL HIGH (ref 0.61–1.24)
GFR calc Af Amer: >60 mL/min (ref >60)
GFR calc non Af Amer: 58 mL/min — ABNORMAL LOW (ref >60)
Glucose, Bld: 121 mg/dL — ABNORMAL HIGH (ref 70–99)
Potassium: 3.4 mmol/L — ABNORMAL LOW (ref 3.5–5.1)
Sodium: 146 mmol/L — ABNORMAL HIGH (ref 135–145)

## 2018-08-31 LAB — GLUCOSE, CAPILLARY
Glucose-Capillary: 108 mg/dL — ABNORMAL HIGH (ref 70–99)
Glucose-Capillary: 112 mg/dL — ABNORMAL HIGH (ref 70–99)
Glucose-Capillary: 113 mg/dL — ABNORMAL HIGH (ref 70–99)
Glucose-Capillary: 115 mg/dL — ABNORMAL HIGH (ref 70–99)
Glucose-Capillary: 123 mg/dL — ABNORMAL HIGH (ref 70–99)
Glucose-Capillary: 125 mg/dL — ABNORMAL HIGH (ref 70–99)

## 2018-08-31 LAB — PHOSPHORUS: Phosphorus: 2.2 mg/dL — ABNORMAL LOW (ref 2.5–4.6)

## 2018-08-31 LAB — MAGNESIUM: Magnesium: 1.9 mg/dL (ref 1.7–2.4)

## 2018-08-31 MED ORDER — POTASSIUM CHLORIDE 10 MEQ/100ML IV SOLN
INTRAVENOUS | Status: AC
  Filled 2018-08-31: qty 100

## 2018-08-31 MED ORDER — POTASSIUM PHOSPHATES 15 MMOLE/5ML IV SOLN
30.0000 mmol | Freq: Once | INTRAVENOUS | Status: AC
  Administered 2018-08-31: 17:00:00 30 mmol via INTRAVENOUS
  Filled 2018-08-31: qty 10

## 2018-08-31 MED ORDER — POTASSIUM CHLORIDE 10 MEQ/100ML IV SOLN
10.0000 meq | INTRAVENOUS | Status: AC
  Administered 2018-08-31 (×3): 10 meq via INTRAVENOUS
  Filled 2018-08-31: qty 100

## 2018-08-31 NOTE — Progress Notes (Signed)
[facility] Progress Note  6 Days Post-Op  Subjective: Worked with therapy and recommending SNF still. NG placed yesterday and dark  Brown/ bilious output noted with > 1 L out on initial placement. Has 400 in the canister now. Distention improved and reported 2 BMs since placement. RN says the one this Am was without a suppository and was fairly substantial with some formed stool.   Objective: Vital signs in last 24 hours: Temp:  [98.1 F (36.7 C)-98.6 F (37 C)] 98.1 F (36.7 C) (05/17 0543) Pulse Rate:  [97-105] 98 (05/17 0543) Resp:  [16-20] 16 (05/17 0543) BP: (116-118)/(49-67) 118/55 (05/17 0543) SpO2:  [90 %-98 %] 98 % (05/17 0543) Weight:  [77.1 kg] 77.1 kg (05/17 0500) Last BM Date: 08/31/18  Intake/Output from previous day: 05/16 0701 - 05/17 0700 In: 2909.4 [I.V.:2909.4] Out: 1985 [Urine:600; Emesis/NG output:1350; Drains:35] Intake/Output this shift: No intake/output data recorded.  General appearance: alert, cooperative and no distress Resp: normal work of breathing GI: soft, less distended, nontender, staples c/d/i without any drainage or erythema, some evolving ecchymosis noted   Lab Results:  No results for input(s): WBC, HGB, HCT, PLT in the last 72 hours. BMET Recent Labs    08/30/18 0452 08/31/18 0554  NA 144 146*  K 3.7 3.4*  CL 109 110  CO2 24 26  GLUCOSE 123* 121*  BUN 34* 28*  CREATININE 1.40* 1.26*  CALCIUM 8.9 8.4*   PT/INR No results for input(s): LABPROT, INR in the last 72 hours.  Studies/Results: Dg Chest Port 1 View  Result Date: 08/30/2018 CLINICAL DATA:  Bedside nasogastric tube placement. EXAM: PORTABLE CHEST 1 VIEW COMPARISON:  08/25/2018 and earlier. FINDINGS: Nasogastric tube looped in the stomach and its tip appears to be within the proximal gastric body. Small bowel distension in the visualized upper abdomen has improved since the examination 5 days ago. Cholecystostomy tube in the RIGHT UPPER QUADRANT again  noted. Suboptimal inspiration with bibasilar atelectasis, unchanged. Lungs remain clear otherwise. Pulmonary vascularity normal. IMPRESSION: 1. Nasogastric tube looped in the stomach and its tip appears to be within the proximal gastric body. 2. Suboptimal inspiration with bibasilar atelectasis, unchanged. 3. No new pulmonary abnormalities. Electronically Signed   By: Hulan Saas M.D.   On: 08/30/2018 15:53    Anti-infectives: Anti-infectives (From admission, onward)   Start     Dose/Rate Route Frequency Ordered Stop   08/26/18 1100  cefoTEtan (CEFOTAN) 2 g in sodium chloride 0.9 % 100 mL IVPB     2 g 200 mL/hr over 30 Minutes Intravenous Every 12 hours 08/26/18 0301 08/28/18 0454   08/26/18 0600  piperacillin-tazobactam (ZOSYN) IVPB 3.375 g  Status:  Discontinued     3.375 g 12.5 mL/hr over 240 Minutes Intravenous Every 8 hours 08/25/18 2347 08/26/18 0301   08/26/18 0000  piperacillin-tazobactam (ZOSYN) IVPB 3.375 g  Status:  Discontinued     3.375 g 100 mL/hr over 30 Minutes Intravenous  Once 08/25/18 2347 08/26/18 0301      Assessment/Plan: Mr. Kosko is a 67 yoPOD6  s/p Ex lap, reduction of volvulus for a high grade small bowel obstruction.Ileus appears to be improving. Overall looking better and having Bms. Distention much better. -Keep NPO and NG for now -Continue suppository BID -Continue chole tube care -Binder in place -AKI improving, K replacement ordered X 3 given K 3.4 -SCDs, heparin sq -Hopefully will continue to improve and can get NG out tomorrow, hopefully will not have to do the SBFT  LOS: 6 days    Lucretia RoersLindsay C Bridges 08/31/2018

## 2018-08-31 NOTE — Progress Notes (Addendum)
PROGRESS NOTE    [name]   WUJ:[id number]  DOB: 22-Oct-1950  DOA: 08/25/2018 PCP: [name], [name], MD   Brief Narrative:  [name]  is a 68 y.o. male with medical history significant for intellectual disability, type 2 diabetes mellitus, BPH, and admission in February for acute gangrenous cholecystitis now with percutaneous drain in place, presenting to the emergency department for evaluation of abdominal pain, anorexia, nausea, and nonbloody vomiting. CT abdomen/pelvis is concerning for high-grade small bowel obstruction with pneumatosis of mesenteric vessels concerning for ischemia.   He was taken to the OR and was found to have a volvulus which was reduced.   Subjective: No complaints.   Assessment & Plan:   Principal Problem:   SBO (small bowel obstruction) due to volvulus s/p reduction - still has ileus- has pulled out NG tube and for now, General surgery has decided to leave it out - abdomen still remains distended today with poor bowel sounds - cont NPO and IVF- appreciate management per general sugery - NG replaced yesterday-     Active Problems: AKI - likely due to vomiting and obstruction- cont IVF until adequately able to take orals  Hypokalemia, hypophosphatemia - being replaced- recheck tomorrow    Intellectual disability - aunt sitting at bedside to help him remain calm    Diabetes mellitus type 2, controlled - on SSI QID    Gall bladder disease - has a cholecystostomy drain    Time spent in minutes: 35 DVT prophylaxis: Heparin Code Status: Full code Family Communication: Aunt at bedside Disposition Plan:  Awaiting ileus to resolve Consultants:   General surgery Procedures:  Exploratory laparotomy with reduction of volvulus  Antimicrobials:  Anti-infectives (From admission, onward)   Start     Dose/Rate Route Frequency Ordered Stop   08/26/18 1100  cefoTEtan (CEFOTAN) 2 g in sodium chloride 0.9 % 100 mL IVPB     2 g 200 mL/hr over 30  Minutes Intravenous Every 12 hours 08/26/18 0301 08/28/18 0454   08/26/18 0600  piperacillin-tazobactam (ZOSYN) IVPB 3.375 g  Status:  Discontinued     3.375 g 12.5 mL/hr over 240 Minutes Intravenous Every 8 hours 08/25/18 2347 08/26/18 0301   08/26/18 0000  piperacillin-tazobactam (ZOSYN) IVPB 3.375 g  Status:  Discontinued     3.375 g 100 mL/hr over 30 Minutes Intravenous  Once 08/25/18 2347 08/26/18 0301     Objective: Vitals:   08/30/18 1952 08/30/18 2256 08/31/18 0500 08/31/18 0543  BP:  (!) 116/49  (!) 118/55  Pulse:  (!) 105  98  Resp:  20  16  Temp:  98.2 F (36.8 C)  98.1 F (36.7 C)  TempSrc:  Oral  Oral  SpO2: 92% 98%  98%  Weight:   77.1 kg   Height:        Intake/Output Summary (Last 24 hours) at 08/31/2018 1331 Last data filed at 08/31/2018 0900 Gross per 24 hour  Intake 2909.44 ml  Output 985 ml  Net 1924.44 ml   Filed Weights   08/29/18 0500 08/30/18 0500 08/31/18 0500  Weight: 76.4 kg 76.4 kg 77.1 kg    Examination: General exam: Appears comfortable  HEENT: PERRLA, oral mucosa moist, no sclera icterus or thrush Respiratory system: Clear to auscultation. Respiratory effort normal. Cardiovascular system: S1 & S2 heard,  No murmurs  Gastrointestinal system: Abdomen soft, non-tender, nondistended. Normal bowel sounds  - NG tube in place Central nervous system: Alert and oriented. No focal neurological deficits. Extremities: No  cyanosis, clubbing or edema Skin: No rashes or ulcers Psychiatry:  Mood & affect appropriate.    Data Reviewed: I have personally reviewed following labs and imaging studies  CBC: Recent Labs  Lab 08/25/18 2020 08/26/18 0400 08/27/18 0515 08/28/18 0442  WBC 11.4* 16.2* 11.7* 11.7*  NEUTROABS  --  15.0*  --  10.1*  HGB 15.3 15.1 12.7* 13.0  HCT 49.4 48.8 40.0 40.9  MCV 93.0 94.4 91.5 91.1  PLT 243 233 204 220   Basic Metabolic Panel: Recent Labs  Lab 08/27/18 0515 08/28/18 0442 08/29/18 0434 08/30/18 0452 08/31/18  0554  NA 140 144 142 144 146*  K 4.2 3.7 4.0 3.7 3.4*  CL 108 107 108 109 110  CO2 GLUCOSE 131* 122* 139* 123* 121*  BUN 38* 34* 35* 34* 28*  CREATININE 1.92* 1.79* 1.60* 1.40* 1.26*  CALCIUM 8.9 9.0 8.8* 8.9 8.4*  MG  --  2.0 2.0 1.9 1.9  PHOS  --  3.3 2.3* 3.3 2.2*   GFR: Estimated Creatinine Clearance: 52.5 mL/min (A) (by C-G formula based on SCr of 1.26 mg/dL (H)). Liver Function Tests: Recent Labs  Lab 08/25/18 2020 08/26/18 0400 08/27/18 0515  AST ALT ALKPHOS 79 68 55  BILITOT 0.4 0.2* 0.5  PROT 9.6* 8.0 6.9  ALBUMIN 4.4 3.6 3.0*   Recent Labs  Lab 08/25/18 2020  LIPASE 30   No results for input(s): AMMONIA in the last 168 hours. Coagulation Profile: No results for input(s): INR, PROTIME in the last 168 hours. Cardiac Enzymes: No results for input(s): CKTOTAL, CKMB, CKMBINDEX, TROPONINI in the last 168 hours. BNP (last 3 results) No results for input(s): PROBNP in the last 8760 hours. HbA1C: No results for input(s): HGBA1C in the last 72 hours. CBG: Recent Labs  Lab 08/30/18 2056 08/31/18 0024 08/31/18 0437 08/31/18 0754 08/31/18 1112  GLUCAP 118* 123* 115* 108* 112*   Lipid Profile: No results for input(s): CHOL, HDL, LDLCALC, TRIG, CHOLHDL, LDLDIRECT in the last 72 hours. Thyroid Function Tests: No results for input(s): TSH, T4TOTAL, FREET4, T3FREE, THYROIDAB in the last 72 hours. Anemia Panel: No results for input(s): VITAMINB12, FOLATE, FERRITIN, TIBC, IRON, RETICCTPCT in the last 72 hours. Urine analysis:    Component Value Date/Time   COLORURINE YELLOW 08/25/2018 1958   APPEARANCEUR CLOUDY (A) 08/25/2018 1958   LABSPEC 1.033 (H) 08/25/2018 1958   PHURINE 5.0 08/25/2018 1958   GLUCOSEU NEGATIVE 08/25/2018 1958   HGBUR MODERATE (A) 08/25/2018 1958   BILIRUBINUR NEGATIVE 08/25/2018 1958   KETONESUR NEGATIVE 08/25/2018 1958   PROTEINUR 30 (A) 08/25/2018 1958   UROBILINOGEN 1.0 07/20/2014 1718   NITRITE  NEGATIVE 08/25/2018 1958   LEUKOCYTESUR LARGE (A) 08/25/2018 1958   Sepsis Labs: (procalcitonin:4,lacticidven:4) ) Recent Results (from the past 240 hour(s))  SARS Coronavirus 2 (CEPHEID - Performed in [facility] hospital lab), Hosp Order     Status: None   Collection Time: 08/25/18 10:30 PM  Result Value Ref Range Status   SARS Coronavirus 2 NEGATIVE NEGATIVE Final    Comment: (NOTE) If result is NEGATIVE SARS-CoV-2 target nucleic acids are NOT DETECTED. The SARS-CoV-2 RNA is generally detectable in upper and lower  respiratory specimens during the acute phase of infection. The lowest  concentration of SARS-CoV-2 viral copies this assay can detect is 250  copies / mL. A negative result does not preclude SARS-CoV-2 infection  and should not be used as the sole  basis for treatment or other  patient management decisions.  A negative result may occur with  improper specimen collection / handling, submission of specimen other  than nasopharyngeal swab, presence of viral mutation(s) within the  areas targeted by this assay, and inadequate number of viral copies  (<250 copies / mL). A negative result must be combined with clinical  observations, patient history, and epidemiological information. If result is POSITIVE SARS-CoV-2 target nucleic acids are DETECTED. The SARS-CoV-2 RNA is generally detectable in upper and lower  respiratory specimens dur ing the acute phase of infection.  Positive  results are indicative of active infection with SARS-CoV-2.  Clinical  correlation with patient history and other diagnostic information is  necessary to determine patient infection status.  Positive results do  not rule out bacterial infection or co-infection with other viruses. If result is PRESUMPTIVE POSTIVE SARS-CoV-2 nucleic acids MAY BE PRESENT.   A presumptive positive result was obtained on the submitted specimen  and confirmed on repeat testing.  While 2019 novel coronavirus   (SARS-CoV-2) nucleic acids may be present in the submitted sample  additional confirmatory testing may be necessary for epidemiological  and / or clinical management purposes  to differentiate between  SARS-CoV-2 and other Sarbecovirus currently known to infect humans.  If clinically indicated additional testing with an alternate test  methodology [id number]) is advised. The SARS-CoV-2 RNA is generally  detectable in upper and lower respiratory sp ecimens during the acute  phase of infection. The expected result is Negative. Fact Sheet for Patients:  BoilerBrush.com.cy Fact Sheet for Healthcare Providers: https://pope.com/ This test is not yet approved or cleared by the [place] FDA and has been authorized for detection and/or diagnosis of SARS-CoV-2 by FDA under an Emergency Use Authorization (EUA).  This EUA will remain in effect (meaning this test can be used) for the duration of the COVID-19 declaration under Section 564(b)(1) of the Act, 21 U.S.C. section 360bbb-3(b)(1), unless the authorization is terminated or revoked sooner. Performed at [facility], [address] [place], [place] [address]   MRSA PCR Screening     Status: None   Collection Time: 08/26/18  2:44 AM  Result Value Ref Range Status   MRSA by PCR NEGATIVE NEGATIVE Final    Comment:        The GeneXpert MRSA Assay (FDA approved for NASAL specimens only), is one component of a comprehensive MRSA colonization surveillance program. It is not intended to diagnose MRSA infection nor to guide or monitor treatment for MRSA infections. Performed at [facility], [address] [place], [place] [address]      Radiology Studies: Dg Chest [facility] 1 View  Result Date: 08/30/2018 CLINICAL DATA:  Bedside nasogastric tube placement. EXAM: PORTABLE CHEST 1 VIEW COMPARISON:  08/25/2018 and earlier. FINDINGS: Nasogastric tube looped in the stomach and its tip appears to be  within the proximal gastric body. Small bowel distension in the visualized upper abdomen has improved since the examination 5 days ago. Cholecystostomy tube in the RIGHT UPPER QUADRANT again noted. Suboptimal inspiration with bibasilar atelectasis, unchanged. Lungs remain clear otherwise. Pulmonary vascularity normal. IMPRESSION: 1. Nasogastric tube looped in the stomach and its tip appears to be within the proximal gastric body. 2. Suboptimal inspiration with bibasilar atelectasis, unchanged. 3. No new pulmonary abnormalities. Electronically Signed   By: Hulan Saas M.D.   On: 08/30/2018 15:53      Scheduled Meds: . bisacodyl  10 mg Rectal BID  . heparin injection (subcutaneous)  5,000  Units Subcutaneous Q8H  . insulin aspart  0-9 Units Subcutaneous Q4H  . sodium chloride flush  3 mL Intravenous Once  . sodium chloride flush  3 mL Intravenous Q12H   Continuous Infusions: . lactated ringers 125 mL/hr at 08/31/18 0752  . potassium chloride 10 mEq (08/31/18 1302)     LOS: 6 days      Calvert Cantor, MD [facility] Pager: [email or web address] Password [email or web address] 08/31/2018, 1:31 PM

## 2018-08-31 NOTE — Progress Notes (Signed)
Physical Therapy Treatment Patient Details Name: [name] MRN: [id number] DOB: [birth year] Today's Date: 08/31/2018    History of Present Illness  [name] is a 68 y.o. male s/p Exploratory laparotomy with reduction of volvulus 08/26/18 with medical history significant for intellectual disability, type 2 diabetes mellitus, BPH, and admission in February for acute gangrenous cholecystitis now with percutaneous drain in place, presenting to the emergency department for evaluation of abdominal pain, anorexia, nausea, and nonbloody vomiting.  Patient is nonverbal and is accompanied by his caretaker who provides the history.  He has been rubbing his abdomen and grimacing today after a few days of progressive abdominal distention.  He is not eating or drinking anything today despite encouragement to do so, and has now developed right current vomiting.  His last bowel movement was 4 days ago.  Patient had a small bowel obstruction during his admission in February that fortunately resolved with conservative management.  Per report of his caretaker, he has not been coughing and has not appeared to be short of breath.    PT Comments    Patient agreeable for therapy and presents with his aunt seated at bedside.  Patient tolerated standing with RW for approximately 8-10 minutes while being cleaned after having a large bowel movement in bed, requires repeated verbal/tactile cueing to complete BLE ROM/strengthening exercises, after transferring to chair patient later tolerated marching in place for 3-4 minutes before having to sit due to fatigue.  Patient tolerated sitting up in chair with wrist restraints re-applied to prevent pulling out NGT with his aunt present after therapy - RN aware.  Patient will benefit from continued physical therapy in hospital and recommended venue below to increase strength, balance, endurance for safe ADLs and gait.   Follow Up Recommendations  SNF;Supervision/Assistance -  24 hour;Supervision for mobility/OOB     Equipment Recommendations  None recommended by PT    Recommendations for Other Services       Precautions / Restrictions Precautions Precautions: Fall Restrictions Weight Bearing Restrictions: No    Mobility  Bed Mobility Overal bed mobility: Needs Assistance Bed Mobility: Supine to Sit     Supine to sit: Min guard     General bed mobility comments: increased time, required less assistance  Transfers Overall transfer level: Needs assistance Equipment used: Rolling walker (2 wheeled) Transfers: Sit to/from [employer] Sit to Stand: Min assist Stand pivot transfers: Min assist       General transfer comment: increased time, labored movement  Ambulation/Gait Ambulation/Gait assistance: Min assist;Mod assist Gait Distance (Feet): 5 Feet Assistive device: Rolling walker (2 wheeled) Gait Pattern/deviations: Decreased step length - right;Decreased step length - left;Decreased stride length Gait velocity: slow   General Gait Details: limited to 6-7 slow unsteady labored steps at bedside secondary to c/o fatigue and excessive leaning on RW   Stairs             Wheelchair Mobility    Modified Rankin (Stroke Patients Only)       Balance Overall balance assessment: Needs assistance Sitting-balance support: Feet unsupported;No upper extremity supported Sitting balance-[name]: Fair     Standing balance support: During functional activity;Bilateral upper extremity supported Standing balance-[name]: Fair Standing balance comment: using RW                            Cognition Arousal/Alertness: Awake/alert Behavior During Therapy: WFL for tasks assessed/performed Overall Cognitive Status: History of cognitive impairments -  at baseline                                        Exercises General Exercises - Lower Extremity Long Arc Quad:  Seated;AAROM;Strengthening;Both;10 reps Hip Flexion/Marching: Seated;Strengthening;AROM;Both;5 reps Toe Raises: Seated;Strengthening;AROM;Both;5 reps Heel Raises: Seated;AROM;Strengthening;Both;5 reps    General Comments        Pertinent Vitals/Pain Pain Assessment: Faces Faces Pain Scale: Hurts a little bit Pain Location: abdominal area at site of surgery Pain Descriptors / Indicators: Aching;Discomfort Pain Intervention(s): Limited activity within patient's tolerance;Monitored during session    Home Living                      Prior Function            PT Goals (current goals can now be found in the care plan section) Acute Rehab PT Goals Patient Stated Goal: Return home with family to assist PT Goal Formulation: With patient/family Time For Goal Achievement: 09/12/18 Potential to Achieve Goals: Good Progress towards PT goals: Progressing toward goals    Frequency    Min 5X/week      PT Plan Current plan remains appropriate    Co-evaluation              AM-PAC PT "6 Clicks" Mobility   Outcome Measure  Help needed turning from your back to your side while in a flat bed without using bedrails?: None Help needed moving from lying on your back to sitting on the side of a flat bed without using bedrails?: A Little Help needed moving to and from a bed to a chair (including a wheelchair)?: A Lot Help needed standing up from a chair using your arms (e.g., wheelchair or bedside chair)?: A Lot Help needed to walk in hospital room?: A Lot Help needed climbing 3-5 steps with a railing? : A Lot 6 Click Score: 15    End of Session   Activity Tolerance: Patient tolerated treatment well;Patient limited by fatigue Patient left: in chair;with call bell/phone within reach;with family/visitor present Nurse Communication: Mobility status PT Visit Diagnosis: Unsteadiness on feet (R26.81);Other abnormalities of gait and mobility (R26.89);Muscle weakness  (generalized) (M62.81)     Time: [id number] PT Time Calculation (min) (ACUTE ONLY): 38 min  Charges:  $Therapeutic Exercise: 8-22 mins $Therapeutic Activity: 23-37 mins                     3:50 PM, 08/31/18 Ocie BobJames Keiana Tavella, MPT Physical Therapist with [facility] [phone] office [phone] mobile phone

## 2018-09-01 ENCOUNTER — Other Ambulatory Visit (HOSPITAL_COMMUNITY): Payer: Medicare Other

## 2018-09-01 ENCOUNTER — Inpatient Hospital Stay (HOSPITAL_COMMUNITY): Payer: Medicare Other

## 2018-09-01 DIAGNOSIS — K56609 Unspecified intestinal obstruction, unspecified as to partial versus complete obstruction: Secondary | ICD-10-CM | POA: Diagnosis not present

## 2018-09-01 DIAGNOSIS — F79 Unspecified intellectual disabilities: Secondary | ICD-10-CM | POA: Diagnosis not present

## 2018-09-01 DIAGNOSIS — K567 Ileus, unspecified: Secondary | ICD-10-CM | POA: Diagnosis not present

## 2018-09-01 DIAGNOSIS — E118 Type 2 diabetes mellitus with unspecified complications: Secondary | ICD-10-CM | POA: Diagnosis not present

## 2018-09-01 LAB — GLUCOSE, CAPILLARY
Glucose-Capillary: 100 mg/dL — ABNORMAL HIGH (ref 70–99)
Glucose-Capillary: 101 mg/dL — ABNORMAL HIGH (ref 70–99)
Glucose-Capillary: 102 mg/dL — ABNORMAL HIGH (ref 70–99)
Glucose-Capillary: 106 mg/dL — ABNORMAL HIGH (ref 70–99)
Glucose-Capillary: 112 mg/dL — ABNORMAL HIGH (ref 70–99)
Glucose-Capillary: 130 mg/dL — ABNORMAL HIGH (ref 70–99)

## 2018-09-01 LAB — BASIC METABOLIC PANEL
Anion gap: 9 (ref 5–15)
BUN: 21 mg/dL (ref 8–23)
CO2: 27 mmol/L (ref 22–32)
Calcium: 8.3 mg/dL — ABNORMAL LOW (ref 8.9–10.3)
Chloride: 110 mmol/L (ref 98–111)
Creatinine, Ser: 1.06 mg/dL (ref 0.61–1.24)
GFR calc Af Amer: >60 mL/min (ref >60)
GFR calc non Af Amer: >60 mL/min (ref >60)
Glucose, Bld: 114 mg/dL — ABNORMAL HIGH (ref 70–99)
Potassium: 3.2 mmol/L — ABNORMAL LOW (ref 3.5–5.1)
Sodium: 146 mmol/L — ABNORMAL HIGH (ref 135–145)

## 2018-09-01 LAB — PHOSPHORUS: Phosphorus: 2.7 mg/dL (ref 2.5–4.6)

## 2018-09-01 LAB — MAGNESIUM: Magnesium: 1.8 mg/dL (ref 1.7–2.4)

## 2018-09-01 MED ORDER — IOPAMIDOL (ISOVUE-300) INJECTION 61%
INTRAVENOUS | Status: AC
  Filled 2018-09-01: qty 50

## 2018-09-01 MED ORDER — POTASSIUM CHLORIDE 10 MEQ/100ML IV SOLN
10.0000 meq | INTRAVENOUS | Status: AC
  Administered 2018-09-01 (×2): 10 meq via INTRAVENOUS
  Filled 2018-09-01 (×2): qty 100

## 2018-09-01 MED ORDER — POTASSIUM CHLORIDE 10 MEQ/100ML IV SOLN
10.0000 meq | INTRAVENOUS | Status: DC
  Administered 2018-09-01 (×4): 10 meq via INTRAVENOUS
  Filled 2018-09-01 (×2): qty 100

## 2018-09-01 NOTE — Care Management Important Message (Signed)
Important Message  Patient Details  Name: [name] MRN: [id number] Date of Birth: 1951/03/18   Medicare Important Message Given:  Yes    [name] 09/01/2018, 3:31 PM

## 2018-09-01 NOTE — Progress Notes (Signed)
[facility] Progress Note  7 Days Post-Op  Subjective: NG output 500 and remains dark.  Only 1 BM recorded yesterday and one this AM too but is more distended than he was yesterday.   Objective: Vital signs in last 24 hours: Temp:  [97.6 F (36.4 C)-98.2 F (36.8 C)] 97.6 F (36.4 C) (05/18 0534) Pulse Rate:  [71-98] 74 (05/18 0534) Resp:  [16-20] 16 (05/18 0534) BP: (118-150)/(51-63) 125/63 (05/18 0534) SpO2:  [96 %-98 %] 98 % (05/18 0534) Weight:  [78.2 kg] 78.2 kg (05/18 0447) Last BM Date: 08/31/18  Intake/Output from previous day: 05/17 0701 - 05/18 0700 In: 3154.1 [I.V.:2857.4; IV Piggyback:296.7] Out: 1650 [Urine:1100; Emesis/NG output:550] Intake/Output this shift: No intake/output data recorded.  General appearance: alert and no distress Resp: normal work of breathing GI: soft, distended, appropriately tender, staples c/d/i without any erythema or drainage, some bruising   Lab Results:  No results for input(s): WBC, HGB, HCT, PLT in the last 72 hours. BMET Recent Labs    08/31/18 0554 09/01/18 0617  NA 146* 146*  K 3.4* 3.2*  CL 110 110  CO2 26 27  GLUCOSE 121* 114*  BUN 28* 21  CREATININE 1.26* 1.06  CALCIUM 8.4* 8.3*   PT/INR No results for input(s): LABPROT, INR in the last 72 hours.  Studies/Results: Dg Chest Port 1 View  Result Date: 08/30/2018 CLINICAL DATA:  Bedside nasogastric tube placement. EXAM: PORTABLE CHEST 1 VIEW COMPARISON:  08/25/2018 and earlier. FINDINGS: Nasogastric tube looped in the stomach and its tip appears to be within the proximal gastric body. Small bowel distension in the visualized upper abdomen has improved since the examination 5 days ago. Cholecystostomy tube in the RIGHT UPPER QUADRANT again noted. Suboptimal inspiration with bibasilar atelectasis, unchanged. Lungs remain clear otherwise. Pulmonary vascularity normal. IMPRESSION: 1. Nasogastric tube looped in the stomach and its tip appears to be within  the proximal gastric body. 2. Suboptimal inspiration with bibasilar atelectasis, unchanged. 3. No new pulmonary abnormalities. Electronically Signed   By: Hulan Saashomas  Lawrence M.D.   On: 08/30/2018 15:53    Anti-infectives: Anti-infectives (From admission, onward)   Start     Dose/Rate Route Frequency Ordered Stop   08/26/18 1100  cefoTEtan (CEFOTAN) 2 g in sodium chloride 0.9 % 100 mL IVPB     2 g 200 mL/hr over 30 Minutes Intravenous Every 12 hours 08/26/18 0301 08/28/18 0454   08/26/18 0600  piperacillin-tazobactam (ZOSYN) IVPB 3.375 g  Status:  Discontinued     3.375 g 12.5 mL/hr over 240 Minutes Intravenous Every 8 hours 08/25/18 2347 08/26/18 0301   08/26/18 0000  piperacillin-tazobactam (ZOSYN) IVPB 3.375 g  Status:  Discontinued     3.375 g 100 mL/hr over 30 Minutes Intravenous  Once 08/25/18 2347 08/26/18 0301      Assessment/Plan: [name] is a [age] yo[age]OD6 s/p Ex lap, reduction of volvulus for a high grade small bowel obstruction.Ileus did appear to be improving but is more distended again. I want to make sure we are just dealing with an ileus and we could possibly given him some promotility agents but I would not want to do this if there is an obstruction. -Keep NPO and NG for now -Continue suppository BID -Continue chole tube care -Binder in place -AKI improving, K replacement ordered already  -SCDs, heparin sq -SBFT given continued distention that is coming and going, may need some promotility agents, have discussed briefly with Dr. Karilyn Cotaehman but no formal consult made yet, will await  the SBFT     LOS: 7 days    [name] 09/01/2018

## 2018-09-01 NOTE — Progress Notes (Signed)
[facility]  SBFT still not completed. Portal Xray being done to see how it progresses. Contrast diluting out and bowel distended with gas.  Difficult to follow. Patient had 2 BMs today per RN report. NG pulled out. Can hold for now as we had it clamped so that we did not suck out the SBFT contrast.  If vomits, will need NG replaced tonight.  RN aware.  Algis Greenhouse, MD [facility] [address] Vella Raring [place], [place] [address] [phone] (office)

## 2018-09-01 NOTE — Progress Notes (Signed)
PROGRESS NOTE    [name]   ZOX:[id number]  DOB: 01-04-1951  DOA: 08/25/2018 PCP: Pearson Grippe, MD   Brief Narrative:  [name]  is a 68 y.o. male with medical history significant for intellectual disability, type 2 diabetes mellitus, BPH, and admission in February for acute gangrenous cholecystitis now with percutaneous drain in place, presenting to the emergency department for evaluation of abdominal pain, anorexia, nausea, and nonbloody vomiting. CT abdomen/pelvis is concerning for high-grade small bowel obstruction with pneumatosis of mesenteric vessels concerning for ischemia.   He was taken to the OR and was found to have a volvulus which was reduced.   Subjective: No complaints.  Assessment & Plan:   Principal Problem:   SBO (small bowel obstruction) due to volvulus s/p reduction - still has ileus- has pulled out NG tube and for now, General surgery has decided to leave it out - abdomen still remains distended today with poor bowel sounds - cont NPO and IVF - NG replaced  - appreciate management per general sugery    Active Problems: AKI - likely due to vomiting and obstruction- cont IVF until adequately able to take orals  Hypokalemia, hypophosphatemia - being replaced- recheck tomorrow    Intellectual disability - aunt sitting at bedside to help him remain calm    Diabetes mellitus type 2, controlled - on SSI QID    Gall bladder disease - has a cholecystostomy drain    Time spent in minutes: 35 DVT prophylaxis: Heparin Code Status: Full code Family Communication: Aunt at bedside Disposition Plan:  Awaiting ileus to resolve Consultants:   General surgery Procedures:  Exploratory laparotomy with reduction of volvulus  Antimicrobials:  Anti-infectives (From admission, onward)   Start     Dose/Rate Route Frequency Ordered Stop   08/26/18 1100  cefoTEtan (CEFOTAN) 2 g in sodium chloride 0.9 % 100 mL IVPB     2 g 200 mL/hr over 30 Minutes  Intravenous Every 12 hours 08/26/18 0301 08/28/18 0454   08/26/18 0600  piperacillin-tazobactam (ZOSYN) IVPB 3.375 g  Status:  Discontinued     3.375 g 12.5 mL/hr over 240 Minutes Intravenous Every 8 hours 08/25/18 2347 08/26/18 0301   08/26/18 0000  piperacillin-tazobactam (ZOSYN) IVPB 3.375 g  Status:  Discontinued     3.375 g 100 mL/hr over 30 Minutes Intravenous  Once 08/25/18 2347 08/26/18 0301     Objective: Vitals:   08/31/18 1947 08/31/18 2122 09/01/18 0447 09/01/18 0534  BP:  (!) 118/51  125/63  Pulse:  71  74  Resp:  20  16  Temp:  98.2 F (36.8 C)  97.6 F (36.4 C)  TempSrc:  Oral  Oral  SpO2: 97% 97%  98%  Weight:   78.2 kg   Height:        Intake/Output Summary (Last 24 hours) at 09/01/2018 1316 Last data filed at 09/01/2018 1218 Gross per 24 hour  Intake 3154.1 ml  Output 2450 ml  Net 704.1 ml   Filed Weights   08/30/18 0500 08/31/18 0500 09/01/18 0447  Weight: 76.4 kg 77.1 kg 78.2 kg    Examination: General exam: Appears comfortable  HEENT: PERRLA, oral mucosa moist, no sclera icterus or thrush Respiratory system: Clear to auscultation. Respiratory effort normal. Cardiovascular system: S1 & S2 heard,  No murmurs  Gastrointestinal system: Abdomen soft, non-tender, nondistended. Normal bowel sounds  - NG tube in place Extremities: No cyanosis, clubbing or edema Skin: No rashes or ulcers  Data Reviewed: I have personally reviewed following labs and imaging studies  CBC: Recent Labs  Lab 08/25/18 2020 08/26/18 0400 08/27/18 0515 08/28/18 0442  WBC 11.4* 16.2* 11.7* 11.7*  NEUTROABS  --  15.0*  --  10.1*  HGB 15.3 15.1 12.7* 13.0  HCT 49.4 48.8 40.0 40.9  MCV 93.0 94.4 91.5 91.1  PLT 243 233 204 220   Basic Metabolic Panel: Recent Labs  Lab 08/28/18 0442 08/29/18 0434 08/30/18 0452 08/31/18 0554 09/01/18 0617  NA 144 142 144 146* 146*  K 3.7 4.0 3.7 3.4* 3.2*  CL 107 108 109 110 110  CO2 25 23 24 26 27   GLUCOSE 122* 139* 123* 121*  114*  BUN 34* 35* 34* 28* 21  CREATININE 1.79* 1.60* 1.40* 1.26* 1.06  CALCIUM 9.0 8.8* 8.9 8.4* 8.3*  MG 2.0 2.0 1.9 1.9 1.8  PHOS 3.3 2.3* 3.3 2.2* 2.7   GFR: Estimated Creatinine Clearance: 62.4 mL/min (by C-G formula based on SCr of 1.06 mg/dL). Liver Function Tests: Recent Labs  Lab 08/25/18 2020 08/26/18 0400 08/27/18 0515  AST 17 16 17   ALT 11 10 10   ALKPHOS 79 68 55  BILITOT 0.4 0.2* 0.5  PROT 9.6* 8.0 6.9  ALBUMIN 4.4 3.6 3.0*   Recent Labs  Lab 08/25/18 2020  LIPASE 30   No results for input(s): AMMONIA in the last 168 hours. Coagulation Profile: No results for input(s): INR, PROTIME in the last 168 hours. Cardiac Enzymes: No results for input(s): CKTOTAL, CKMB, CKMBINDEX, TROPONINI in the last 168 hours. BNP (last 3 results) No results for input(s): PROBNP in the last 8760 hours. HbA1C: No results for input(s): HGBA1C in the last 72 hours. CBG: Recent Labs  Lab 08/31/18 2015 09/01/18 0010 09/01/18 0401 09/01/18 0722 09/01/18 1139  GLUCAP 125* 130* 101* 106* 112*   Lipid Profile: No results for input(s): CHOL, HDL, LDLCALC, TRIG, CHOLHDL, LDLDIRECT in the last 72 hours. Thyroid Function Tests: No results for input(s): TSH, T4TOTAL, FREET4, T3FREE, THYROIDAB in the last 72 hours. Anemia Panel: No results for input(s): VITAMINB12, FOLATE, FERRITIN, TIBC, IRON, RETICCTPCT in the last 72 hours. Urine analysis:    Component Value Date/Time   COLORURINE YELLOW 08/25/2018 1958   APPEARANCEUR CLOUDY (A) 08/25/2018 1958   LABSPEC 1.033 (H) 08/25/2018 1958   PHURINE 5.0 08/25/2018 1958   GLUCOSEU NEGATIVE 08/25/2018 1958   HGBUR MODERATE (A) 08/25/2018 1958   BILIRUBINUR NEGATIVE 08/25/2018 1958   KETONESUR NEGATIVE 08/25/2018 1958   PROTEINUR 30 (A) 08/25/2018 1958   UROBILINOGEN 1.0 07/20/2014 1718   NITRITE NEGATIVE 08/25/2018 1958   LEUKOCYTESUR LARGE (A) 08/25/2018 1958   Sepsis Labs: @LABRCNTIP (procalcitonin:4,lacticidven:4) ) Recent  Results (from the past 240 hour(s))  SARS Coronavirus 2 (CEPHEID - Performed [facility]), Hosp Order     Status: None   Collection Time: 08/25/18 10:30 PM  Result Value Ref Range Status   SARS Coronavirus 2 NEGATIVE NEGATIVE Final    Comment: (NOTE) If result is NEGATIVE SARS-CoV-2 target nucleic acids are NOT DETECTED. The SARS-CoV-2 RNA is generally detectable in upper and lower  respiratory specimens during the acute phase of infection. The lowest  concentration of SARS-CoV-2 viral copies this assay can detect is 250  copies / mL. A negative result does not preclude SARS-CoV-2 infection  and should not be used as the sole basis for treatment or other  patient management decisions.  A negative result may occur with  improper specimen collection / handling, submission of  specimen other  than nasopharyngeal swab, presence of viral mutation(s) within the  areas targeted by this assay, and inadequate number of viral copies  (<250 copies / mL). A negative result must be combined with clinical  observations, patient history, and epidemiological information. If result is POSITIVE SARS-CoV-2 target nucleic acids are DETECTED. The SARS-CoV-2 RNA is generally detectable in upper and lower  respiratory specimens dur ing the acute phase of infection.  Positive  results are indicative of active infection with SARS-CoV-2.  Clinical  correlation with patient history and other diagnostic information is  necessary to determine patient infection status.  Positive results do  not rule out bacterial infection or co-infection with other viruses. If result is PRESUMPTIVE POSTIVE SARS-CoV-2 nucleic acids MAY BE PRESENT.   A presumptive positive result was obtained on the submitted specimen  and confirmed on repeat testing.  While 2019 novel coronavirus  (SARS-CoV-2) nucleic acids may be present in the submitted sample  additional confirmatory testing may be necessary for epidemiological   and / or clinical management purposes  to differentiate between  SARS-CoV-2 and other Sarbecovirus currently known to infect humans.  If clinically indicated additional testing with an alternate test  methodology [id number]) is advised. The SARS-CoV-2 RNA is generally  detectable in upper and lower respiratory sp ecimens during the acute  phase of infection. The expected result is Negative. Fact Sheet for Patients:  BoilerBrush.com.cy Fact Sheet for Healthcare Providers: https://pope.com/ This test is not yet approved or cleared by the [place] FDA and has been authorized for detection and/or diagnosis of SARS-CoV-2 by FDA under an Emergency Use Authorization (EUA).  This EUA will remain in effect (meaning this test can be used) for the duration of the COVID-19 declaration under Section 564(b)(1) of the Act, 21 U.S.C. section 360bbb-3(b)(1), unless the authorization is terminated or revoked sooner. Performed at [facility], [address] [place], [place] [address]   MRSA PCR Screening     Status: None   Collection Time: 08/26/18  2:44 AM  Result Value Ref Range Status   MRSA by PCR NEGATIVE NEGATIVE Final    Comment:        The GeneXpert MRSA Assay (FDA approved for NASAL specimens only), is one component of a comprehensive MRSA colonization surveillance program. It is not intended to diagnose MRSA infection nor to guide or monitor treatment for MRSA infections. Performed at [facility], [address], [place], [place] [address]      Radiology Studies: Dg Chest [facility] 1 View  Result Date: 08/30/2018 CLINICAL DATA:  Bedside nasogastric tube placement. EXAM: PORTABLE CHEST 1 VIEW COMPARISON:  08/25/2018 and earlier. FINDINGS: Nasogastric tube looped in the stomach and its tip appears to be within the proximal gastric body. Small bowel distension in the visualized upper abdomen has improved since the examination 5 days ago.  Cholecystostomy tube in the RIGHT UPPER QUADRANT again noted. Suboptimal inspiration with bibasilar atelectasis, unchanged. Lungs remain clear otherwise. Pulmonary vascularity normal. IMPRESSION: 1. Nasogastric tube looped in the stomach and its tip appears to be within the proximal gastric body. 2. Suboptimal inspiration with bibasilar atelectasis, unchanged. 3. No new pulmonary abnormalities. Electronically Signed   By: Hulan Saas M.D.   On: 08/30/2018 15:53      Scheduled Meds: . bisacodyl  10 mg Rectal BID  . heparin injection (subcutaneous)  5,000 Units Subcutaneous Q8H  . insulin aspart  0-9 Units Subcutaneous Q4H  . iopamidol      . sodium chloride flush  3 mL Intravenous Once  . sodium chloride flush  3 mL Intravenous Q12H   Continuous Infusions: . lactated ringers 125 mL/hr at 09/01/18 0835  . potassium chloride 10 mEq (09/01/18 1218)     LOS: 7 days      Calvert Cantor, MD [facility] Pager: [email or web address] Password [email or web address] 09/01/2018, 1:16 PM

## 2018-09-01 NOTE — TOC Progression Note (Signed)
Transition of Care [facility]) - Progression Note    Patient Details  Name: [name] MRN: [id number] Date of Birth: [birth year]  [facility]) CM/SW Contact  Theda Belfast Era Skeen, RN Phone Number: 09/01/2018, 5:14 PM  Clinical Narrative:   Family accepted bed at [facility].     Expected Discharge Plan: Skilled Nursing Facility Barriers to Discharge: Continued Medical Work up  Expected Discharge Plan and Services Expected Discharge Plan: Skilled Nursing Facility         Readmission Risk Interventions Readmission Risk Prevention Plan 08/27/2018  Transportation Screening Complete  Some recent data might be hidden

## 2018-09-01 NOTE — Progress Notes (Signed)
Physical Therapy Treatment Patient Details Name: [name] MRN: [id number] DOB: November 03, 1950 Today's Date: 09/01/2018    History of Present Illness  [name] is a 68 y.o. male s/p Exploratory laparotomy with reduction of volvulus 08/26/18 with medical history significant for intellectual disability, type 2 diabetes mellitus, BPH, and admission in February for acute gangrenous cholecystitis now with percutaneous drain in place, presenting to the emergency department for evaluation of abdominal pain, anorexia, nausea, and nonbloody vomiting.  Patient is nonverbal and is accompanied by his caretaker who provides the history.  He has been rubbing his abdomen and grimacing today after a few days of progressive abdominal distention.  He is not eating or drinking anything today despite encouragement to do so, and has now developed right current vomiting.  His last bowel movement was 4 days ago.  Patient had a small bowel obstruction during his admission in February that fortunately resolved with conservative management.  Per report of his caretaker, he has not been coughing and has not appeared to be short of breath.    PT Comments    Patient tolerated taking more steps, but unsafe to ambulate away from bedside secondary to pushing RW to far in front requiring assistance to step closer to walker in order to avoid falling.  Patient stood and marching in place for approximately 2-3 minutes before having to sit down secondary to fatigue.  Patient tolerated sitting up in chair after therapy with his family member present in room.  Patient will benefit from continued physical therapy in hospital and recommended venue below to increase strength, balance, endurance for safe ADLs and gait.    Follow Up Recommendations  SNF;Supervision/Assistance - 24 hour;Supervision for mobility/OOB     Equipment Recommendations  None recommended by PT    Recommendations for Other Services       Precautions /  Restrictions Precautions Precautions: Fall Restrictions Weight Bearing Restrictions: No    Mobility  Bed Mobility Overal bed mobility: Needs Assistance Bed Mobility: Supine to Sit     Supine to sit: Min guard     General bed mobility comments: increased time, labored movement  Transfers Overall transfer level: Needs assistance Equipment used: Rolling walker (2 wheeled) Transfers: Sit to/from [employer] Sit to Stand: Min assist Stand pivot transfers: Min assist       General transfer comment: increased time, labored movement  Ambulation/Gait Ambulation/Gait assistance: Mod assist Gait Distance (Feet): 12 Feet Assistive device: Rolling walker (2 wheeled) Gait Pattern/deviations: Decreased step length - right;Decreased step length - left;Decreased stride length;Staggering left;Staggering right Gait velocity: slow   General Gait Details: patient tends to push walker to far in front when walking away from bedside requiring Mod assistance to keep patient closer to RW in order to prevent loss of balance   Stairs             Wheelchair Mobility    Modified Rankin (Stroke Patients Only)       Balance Overall balance assessment: Needs assistance Sitting-balance support: Feet supported;No upper extremity supported Sitting balance-[name]: Fair Sitting balance - Comments: fair/good   Standing balance support: Bilateral upper extremity supported;During functional activity Standing balance-[name]: Fair Standing balance comment: using RW                            Cognition Arousal/Alertness: Awake/alert Behavior During Therapy: WFL for tasks assessed/performed Overall Cognitive Status: History of cognitive impairments - at baseline  Exercises General Exercises - Lower Extremity Ankle Circles/Pumps: Seated;AROM;Strengthening;Both;10 reps Long Arc Quad:  Seated;Strengthening;Both;10 reps;AROM Hip Flexion/Marching: Seated;Strengthening;AROM;Both;10 reps    General Comments        Pertinent Vitals/Pain Pain Assessment: Faces Faces Pain Scale: Hurts a little bit Pain Location: abdominal area at site of surgery wje attempting to extend trunk Pain Descriptors / Indicators: Aching;Discomfort;Sore Pain Intervention(s): Limited activity within patient's tolerance;Monitored during session    Home Living                      Prior Function            PT Goals (current goals can now be found in the care plan section) Acute Rehab PT Goals Patient Stated Goal: Return home with family to assist PT Goal Formulation: With patient/family Time For Goal Achievement: 09/12/18 Potential to Achieve Goals: Good Progress towards PT goals: Progressing toward goals    Frequency    Min 5X/week      PT Plan Current plan remains appropriate    Co-evaluation              AM-PAC PT "6 Clicks" Mobility   Outcome Measure  Help needed turning from your back to your side while in a flat bed without using bedrails?: None Help needed moving from lying on your back to sitting on the side of a flat bed without using bedrails?: A Little Help needed moving to and from a bed to a chair (including a wheelchair)?: A Lot Help needed standing up from a chair using your arms (e.g., wheelchair or bedside chair)?: A Lot Help needed to walk in hospital room?: A Lot Help needed climbing 3-5 steps with a railing? : A Lot 6 Click Score: 15    End of Session   Activity Tolerance: Patient tolerated treatment well;Patient limited by fatigue Patient left: in chair;with call bell/phone within reach;with family/visitor present Nurse Communication: Mobility status PT Visit Diagnosis: Unsteadiness on feet (R26.81);Other abnormalities of gait and mobility (R26.89);Muscle weakness (generalized) (M62.81)     Time: [id number] PT Time Calculation (min)  (ACUTE ONLY): 28 min  Charges:  $Therapeutic Exercise: 8-22 mins $Therapeutic Activity: 8-22 mins                     3:41 PM, 09/01/18 Ocie BobJames Amran Malter, MPT Physical Therapist with [facility] 336 [phone] office [phone] mobile phone

## 2018-09-02 DIAGNOSIS — F79 Unspecified intellectual disabilities: Secondary | ICD-10-CM | POA: Diagnosis not present

## 2018-09-02 DIAGNOSIS — E118 Type 2 diabetes mellitus with unspecified complications: Secondary | ICD-10-CM | POA: Diagnosis not present

## 2018-09-02 DIAGNOSIS — K567 Ileus, unspecified: Secondary | ICD-10-CM | POA: Diagnosis not present

## 2018-09-02 DIAGNOSIS — K56609 Unspecified intestinal obstruction, unspecified as to partial versus complete obstruction: Secondary | ICD-10-CM | POA: Diagnosis not present

## 2018-09-02 LAB — BASIC METABOLIC PANEL
Anion gap: 11 (ref 5–15)
BUN: 23 mg/dL (ref 8–23)
CO2: 24 mmol/L (ref 22–32)
Calcium: 8.1 mg/dL — ABNORMAL LOW (ref 8.9–10.3)
Chloride: 108 mmol/L (ref 98–111)
Creatinine, Ser: 1.07 mg/dL (ref 0.61–1.24)
GFR calc Af Amer: >60 mL/min (ref >60)
GFR calc non Af Amer: >60 mL/min (ref >60)
Glucose, Bld: 114 mg/dL — ABNORMAL HIGH (ref 70–99)
Potassium: 3.4 mmol/L — ABNORMAL LOW (ref 3.5–5.1)
Sodium: 143 mmol/L (ref 135–145)

## 2018-09-02 LAB — GLUCOSE, CAPILLARY
Glucose-Capillary: 101 mg/dL — ABNORMAL HIGH (ref 70–99)
Glucose-Capillary: 103 mg/dL — ABNORMAL HIGH (ref 70–99)
Glucose-Capillary: 105 mg/dL — ABNORMAL HIGH (ref 70–99)
Glucose-Capillary: 114 mg/dL — ABNORMAL HIGH (ref 70–99)
Glucose-Capillary: 139 mg/dL — ABNORMAL HIGH (ref 70–99)
Glucose-Capillary: 143 mg/dL — ABNORMAL HIGH (ref 70–99)

## 2018-09-02 LAB — HEPATIC FUNCTION PANEL
ALT: 10 U/L (ref 0–44)
AST: 13 U/L — ABNORMAL LOW (ref 15–41)
Albumin: 2.4 g/dL — ABNORMAL LOW (ref 3.5–5.0)
Alkaline Phosphatase: 50 U/L (ref 38–126)
Bilirubin, Direct: 0.1 mg/dL (ref 0.0–0.2)
Indirect Bilirubin: 0.7 mg/dL (ref 0.3–0.9)
Total Bilirubin: 0.8 mg/dL (ref 0.3–1.2)
Total Protein: 5.4 g/dL — ABNORMAL LOW (ref 6.5–8.1)

## 2018-09-02 LAB — PREALBUMIN: Prealbumin: 10.1 mg/dL — ABNORMAL LOW (ref 18–38)

## 2018-09-02 LAB — PHOSPHORUS: Phosphorus: 2.5 mg/dL (ref 2.5–4.6)

## 2018-09-02 LAB — MAGNESIUM: Magnesium: 1.8 mg/dL (ref 1.7–2.4)

## 2018-09-02 MED ORDER — ALBUMIN HUMAN 25 % IV SOLN: 25.0000 g | Freq: Two times a day (BID) | INTRAVENOUS | Status: DC

## 2018-09-02 MED ORDER — POTASSIUM CHLORIDE 10 MEQ/100ML IV SOLN
10.0000 meq | INTRAVENOUS | Status: AC
  Administered 2018-09-02 (×2): 10 meq via INTRAVENOUS
  Filled 2018-09-02 (×3): qty 100

## 2018-09-02 MED ORDER — ALBUMIN HUMAN 25 % IV SOLN
25.0000 g | Freq: Three times a day (TID) | INTRAVENOUS | Status: DC
  Administered 2018-09-02 – 2018-09-04 (×6): 25 g via INTRAVENOUS
  Filled 2018-09-02 (×5): qty 100
  Filled 2018-09-02: qty 50

## 2018-09-02 MED ORDER — SIMETHICONE 40 MG/0.6ML PO SUSP
40.0000 mg | Freq: Four times a day (QID) | ORAL | Status: DC
  Administered 2018-09-02 – 2018-09-04 (×10): 40 mg via ORAL
  Filled 2018-09-02 (×4): qty 30

## 2018-09-02 MED ORDER — MAGNESIUM SULFATE 2 GM/50ML IV SOLN
2.0000 g | Freq: Once | INTRAVENOUS | Status: AC
  Administered 2018-09-02: 2 g via INTRAVENOUS
  Filled 2018-09-02: qty 50

## 2018-09-02 NOTE — Progress Notes (Signed)
Physical Therapy Treatment Patient Details Name: [name] MRN: [id number] DOB: 05-17-1950 Today's Date: 09/02/2018    History of Present Illness  [name] is a 68 y.o. male s/p Exploratory laparotomy with reduction of volvulus 08/26/18 with medical history significant for intellectual disability, type 2 diabetes mellitus, BPH, and admission in February for acute gangrenous cholecystitis now with percutaneous drain in place, presenting to the emergency department for evaluation of abdominal pain, anorexia, nausea, and nonbloody vomiting.  Patient is nonverbal and is accompanied by his caretaker who provides the history.  He has been rubbing his abdomen and grimacing today after a few days of progressive abdominal distention.  He is not eating or drinking anything today despite encouragement to do so, and has now developed right current vomiting.  His last bowel movement was 4 days ago.  Patient had a small bowel obstruction during his admission in February that fortunately resolved with conservative management.  Per report of his caretaker, he has not been coughing and has not appeared to be short of breath.    PT Comments    Patient demonstrates increased endurance/distance for taking steps in room, able to make it to doorway and back to bedside, but continues to push RW to far in front with near loss of balance requiring verbal/tactile assistance to step closer to walker.  Patient requires less assistance for sitting up at bedside and tolerated sitting up in chair after therapy with family member present in room after therapy.  Patient will benefit from continued physical therapy in hospital and recommended venue below to increase strength, balance, endurance for safe ADLs and gait.    Follow Up Recommendations  SNF;Supervision/Assistance - 24 hour;Supervision for mobility/OOB     Equipment Recommendations  None recommended by PT    Recommendations for Other Services        Precautions / Restrictions Precautions Precautions: Fall Restrictions Weight Bearing Restrictions: No    Mobility  Bed Mobility Overal bed mobility: Needs Assistance Bed Mobility: Supine to Sit     Supine to sit: Supervision     General bed mobility comments: incre  Transfers Overall transfer level: Needs assistance Equipment used: Rolling walker (2 wheeled) Transfers: Sit to/from [employer];Stand Pivot Transfers Sit to Stand: Min assist Stand pivot transfers: Min assist       General transfer comment: increased time, labored movement  Ambulation/Gait Ambulation/Gait assistance: Min assist;Mod assist Gait Distance (Feet): 20 Feet Assistive device: Rolling walker (2 wheeled) Gait Pattern/deviations: Decreased step length - right;Decreased step length - left;Decreased stride length Gait velocity: decreased   General Gait Details: continues to lean over RW pushing walker to far in front requiring verbal/tactile cueing to step closer to walker in order to prevent falling, demonstrates increased endurance/distance for taking steps   Stairs             Wheelchair Mobility    Modified Rankin (Stroke Patients Only)       Balance Overall balance assessment: Needs assistance Sitting-balance support: Feet supported Sitting balance-[name]: Fair Sitting balance - Comments: fair/good   Standing balance support: Bilateral upper extremity supported;During functional activity Standing balance-[name]: Fair Standing balance comment: using RW                            Cognition Arousal/Alertness: Awake/alert Behavior During Therapy: WFL for tasks assessed/performed Overall Cognitive Status: History of cognitive impairments - at baseline  General Comments: follows directions consistently requiring occasional repeated verbal/tactile cueing      Exercises General Exercises - Lower Extremity Long Arc  Quad: Seated;Strengthening;Both;10 reps;AROM Hip Flexion/Marching: Seated;Strengthening;AROM;Both;10 reps Toe Raises: Seated;Strengthening;AROM;Both;10 reps Heel Raises: Seated;AROM;Strengthening;Both;10 reps    General Comments        Pertinent Vitals/Pain Pain Assessment: No/denies pain    Home Living                      Prior Function            PT Goals (current goals can now be found in the care plan section) Acute Rehab PT Goals Patient Stated Goal: Return home with family to assist PT Goal Formulation: With patient/family Time For Goal Achievement: 09/12/18 Potential to Achieve Goals: Good Progress towards PT goals: Progressing toward goals    Frequency    Min 5X/week      PT Plan Current plan remains appropriate    Co-evaluation              AM-PAC PT "6 Clicks" Mobility   Outcome Measure  Help needed turning from your back to your side while in a flat bed without using bedrails?: None Help needed moving from lying on your back to sitting on the side of a flat bed without using bedrails?: None Help needed moving to and from a bed to a chair (including a wheelchair)?: A Little Help needed standing up from a chair using your arms (e.g., wheelchair or bedside chair)?: A Little Help needed to walk in hospital room?: A Lot Help needed climbing 3-5 steps with a railing? : A Lot 6 Click Score: 18    End of Session   Activity Tolerance: Patient tolerated treatment well;Patient limited by fatigue Patient left: in chair;with call bell/phone within reach;with family/visitor present Nurse Communication: Mobility status PT Visit Diagnosis: Unsteadiness on feet (R26.81);Other abnormalities of gait and mobility (R26.89);Muscle weakness (generalized) (M62.81)     Time: [id number] PT Time Calculation (min) (ACUTE ONLY): 24 min  Charges:  $Therapeutic Exercise: 8-22 mins $Therapeutic Activity: 8-22 mins                     11:19 AM,  09/02/18 Ocie Bob, MPT Physical Therapist with [facility] [phone] office [phone] mobile phone

## 2018-09-02 NOTE — Progress Notes (Signed)
PROGRESS NOTE    [name]   JYN:[id number]  DOB: 11-Jan-1951  DOA: 08/25/2018 PCP: [name], [name], MD   Brief Narrative:  [name]  is a 68 y.o. male with medical history significant for intellectual disability, type 2 diabetes mellitus, BPH, and admission in February for acute gangrenous cholecystitis now with percutaneous drain in place, presenting to the emergency department for evaluation of abdominal pain, anorexia, nausea, and nonbloody vomiting. CT abdomen/pelvis is concerning for high-grade small bowel obstruction with pneumatosis of mesenteric vessels concerning for ischemia.   He was taken to the OR and was found to have a volvulus which was reduced.   Subjective: No complaints.  Assessment & Plan:   Principal Problem:   SBO (small bowel obstruction) due to volvulus s/p reduction - still has ileus- has pulled out NG tube and for now, General surgery has decided to leave it out - abdomen still remains distended today - he has had 2 BMs and bowel sounds are a bit improved   - appreciate management per general sugery- plans to start liquids today     Active Problems: AKI - likely due to vomiting and obstruction- cont IVF until adequately able to take orals  Hypokalemia, hypophosphatemia - being replaced- recheck tomorrow    Intellectual disability - aunt often sitting at bedside to help him remain calm    Diabetes mellitus type 2, controlled - on SSI QID    Gall bladder disease - has a cholecystostomy drain    Time spent in minutes: 35 DVT prophylaxis: Heparin Code Status: Full code Family Communication: Aunt at bedside Disposition Plan:  Awaiting ileus to resolve Consultants:   General surgery Procedures:  Exploratory laparotomy with reduction of volvulus  Antimicrobials:  Anti-infectives (From admission, onward)   Start     Dose/Rate Route Frequency Ordered Stop   08/26/18 1100  cefoTEtan (CEFOTAN) 2 g in sodium chloride 0.9 % 100 mL IVPB     2 g 200 mL/hr over 30 Minutes Intravenous Every 12 hours 08/26/18 0301 08/28/18 0454   08/26/18 0600  piperacillin-tazobactam (ZOSYN) IVPB 3.375 g  Status:  Discontinued     3.375 g 12.5 mL/hr over 240 Minutes Intravenous Every 8 hours 08/25/18 2347 08/26/18 0301   08/26/18 0000  piperacillin-tazobactam (ZOSYN) IVPB 3.375 g  Status:  Discontinued     3.375 g 100 mL/hr over 30 Minutes Intravenous  Once 08/25/18 2347 08/26/18 0301     Objective: Vitals:   09/01/18 1400 09/01/18 1951 09/01/18 2112 09/02/18 0417  BP: 122/79  123/65 (!) 110/59  Pulse: 71  80 81  Resp: 18     Temp: 98.1 F (36.7 C)  98.5 F (36.9 C) 97.7 F (36.5 C)  TempSrc:   Oral Oral  SpO2: 98% 93% 96% 96%  Weight:      Height:        Intake/Output Summary (Last 24 hours) at 09/02/2018 1041 Last data filed at 09/02/2018 0500 Gross per 24 hour  Intake 1858.61 ml  Output 1845 ml  Net 13.61 ml   Filed Weights   08/30/18 0500 08/31/18 0500 09/01/18 0447  Weight: 76.4 kg 77.1 kg 78.2 kg    Examination: General exam: Appears comfortable  HEENT: PERRLA, oral mucosa moist, no sclera icterus or thrush Respiratory system: Clear to auscultation. Respiratory effort normal. Cardiovascular system: S1 & S2 heard,  No murmurs  Gastrointestinal system: Abdomen soft, non-tender, nondistended. Normal bowel sounds  - NG tube in place Extremities: No cyanosis, clubbing or  edema Skin: No rashes or ulcers     Data Reviewed: I have personally reviewed following labs and imaging studies  CBC: Recent Labs  Lab 08/27/18 0515 08/28/18 0442  WBC 11.7* 11.7*  NEUTROABS  --  10.1*  HGB 12.7* 13.0  HCT 40.0 40.9  MCV 91.5 91.1  PLT 204 220   Basic Metabolic Panel: Recent Labs  Lab 08/29/18 0434 08/30/18 0452 08/31/18 0554 09/01/18 0617 09/02/18 0515  NA 142 144 146* 146* 143  K 4.0 3.7 3.4* 3.2* 3.4*  CL 108 109 110 110 108  CO2 23 24 26 27 24   GLUCOSE 139* 123* 121* 114* 114*  BUN 35* 34* 28* 21 23   CREATININE 1.60* 1.40* 1.26* 1.06 1.07  CALCIUM 8.8* 8.9 8.4* 8.3* 8.1*  MG 2.0 1.9 1.9 1.8 1.8  PHOS 2.3* 3.3 2.2* 2.7 2.5   GFR: Estimated Creatinine Clearance: 61.8 mL/min (by C-G formula based on SCr of 1.07 mg/dL). Liver Function Tests: Recent Labs  Lab 08/27/18 0515 09/02/18 0515  AST 17 13*  ALT 10 10  ALKPHOS 55 50  BILITOT 0.5 0.8  PROT 6.9 5.4*  ALBUMIN 3.0* 2.4*   No results for input(s): LIPASE, AMYLASE in the last 168 hours. No results for input(s): AMMONIA in the last 168 hours. Coagulation Profile: No results for input(s): INR, PROTIME in the last 168 hours. Cardiac Enzymes: No results for input(s): CKTOTAL, CKMB, CKMBINDEX, TROPONINI in the last 168 hours. BNP (last 3 results) No results for input(s): PROBNP in the last 8760 hours. HbA1C: No results for input(s): HGBA1C in the last 72 hours. CBG: Recent Labs  Lab 09/01/18 1642 09/01/18 2017 09/02/18 0003 09/02/18 0418 09/02/18 0755  GLUCAP 102* 100* 114* 105* 101*   Lipid Profile: No results for input(s): CHOL, HDL, LDLCALC, TRIG, CHOLHDL, LDLDIRECT in the last 72 hours. Thyroid Function Tests: No results for input(s): TSH, T4TOTAL, FREET4, T3FREE, THYROIDAB in the last 72 hours. Anemia Panel: No results for input(s): VITAMINB12, FOLATE, FERRITIN, TIBC, IRON, RETICCTPCT in the last 72 hours. Urine analysis:    Component Value Date/Time   COLORURINE YELLOW 08/25/2018 1958   APPEARANCEUR CLOUDY (A) 08/25/2018 1958   LABSPEC 1.033 (H) 08/25/2018 1958   PHURINE 5.0 08/25/2018 1958   GLUCOSEU NEGATIVE 08/25/2018 1958   HGBUR MODERATE (A) 08/25/2018 1958   BILIRUBINUR NEGATIVE 08/25/2018 1958   KETONESUR NEGATIVE 08/25/2018 1958   PROTEINUR 30 (A) 08/25/2018 1958   UROBILINOGEN 1.0 07/20/2014 1718   NITRITE NEGATIVE 08/25/2018 1958   LEUKOCYTESUR LARGE (A) 08/25/2018 1958   Sepsis Labs: @LABRCNTIP (procalcitonin:4,lacticidven:4) ) Recent Results (from the past 240 hour(s))  SARS Coronavirus 2  (CEPHEID - Performed in [facility] hospital lab), Hosp Order     Status: None   Collection Time: 08/25/18 10:30 PM  Result Value Ref Range Status   SARS Coronavirus 2 NEGATIVE NEGATIVE Final    Comment: (NOTE) If result is NEGATIVE SARS-CoV-2 target nucleic acids are NOT DETECTED. The SARS-CoV-2 RNA is generally detectable in upper and lower  respiratory specimens during the acute phase of infection. The lowest  concentration of SARS-CoV-2 viral copies this assay can detect is 250  copies / mL. A negative result does not preclude SARS-CoV-2 infection  and should not be used as the sole basis for treatment or other  patient management decisions.  A negative result may occur with  improper specimen collection / handling, submission of specimen other  than nasopharyngeal swab, presence of viral mutation(s) within the  areas targeted by  this assay, and inadequate number of viral copies  (<250 copies / mL). A negative result must be combined with clinical  observations, patient history, and epidemiological information. If result is POSITIVE SARS-CoV-2 target nucleic acids are DETECTED. The SARS-CoV-2 RNA is generally detectable in upper and lower  respiratory specimens dur ing the acute phase of infection.  Positive  results are indicative of active infection with SARS-CoV-2.  Clinical  correlation with patient history and other diagnostic information is  necessary to determine patient infection status.  Positive results do  not rule out bacterial infection or co-infection with other viruses. If result is PRESUMPTIVE POSTIVE SARS-CoV-2 nucleic acids MAY BE PRESENT.   A presumptive positive result was obtained on the submitted specimen  and confirmed on repeat testing.  While 2019 novel coronavirus  (SARS-CoV-2) nucleic acids may be present in the submitted sample  additional confirmatory testing may be necessary for epidemiological  and / or clinical management purposes  to differentiate  between  SARS-CoV-2 and other Sarbecovirus currently known to infect humans.  If clinically indicated additional testing with an alternate test  methodology [phone]) is advised. The SARS-CoV-2 RNA is generally  detectable in upper and lower respiratory sp ecimens during the acute  phase of infection. The expected result is Negative. Fact Sheet for Patients:  BoilerBrush.com.cy Fact Sheet for Healthcare Providers: https://pope.com/ This test is not yet approved or cleared by the [place] FDA and has been authorized for detection and/or diagnosis of SARS-CoV-2 by FDA under an Emergency Use Authorization (EUA).  This EUA will remain in effect (meaning this test can be used) for the duration of the COVID-19 declaration under Section 564(b)(1) of the Act, 21 U.S.C. section 360bbb-3(b)(1), unless the authorization is terminated or revoked sooner. Performed at [facility], [address], [place], [place] [address]   MRSA PCR Screening     Status: None   Collection Time: 08/26/18  2:44 AM  Result Value Ref Range Status   MRSA by PCR NEGATIVE NEGATIVE Final    Comment:        The GeneXpert MRSA Assay (FDA approved for NASAL specimens only), is one component of a comprehensive MRSA colonization surveillance program. It is not intended to diagnose MRSA infection nor to guide or monitor treatment for MRSA infections. Performed at [facility], [address], [place], [place] [address]      Radiology Studies: Dg Small Bowel W Single Cm (sol Or Thin Ba)  Result Date: 09/02/2018 CLINICAL DATA:  Small bowel obstruction. EXAM: SMALL BOWEL SERIES COMPARISON:  Radiographs of Aug 27, 2018. TECHNIQUE: Following ingestion of water-soluble contrast, serial small bowel images were obtained. No spot images were obtained. 50 mL of water-soluble contrast was mixed with 50 mL of water and administered through pre-existing nasogastric tube. Diffuse  small bowel dilatation is noted concerning for ileus or distal small bowel obstruction. No significant colonic dilatation is noted. The initial images demonstrate contrast remaining within the proximal stomach. The final image was obtained 16 hours after the administration of contrast. No contrast is visualized in the small or large bowel on this study. IMPRESSION: Diffuse small bowel dilatation is noted. The contrast administered through nasogastric tube with seen to pool within the stomach and not advance on the initial images. Delayed images demonstrate no contrast present in the large or small bowel. These findings are consistent with either ileus or distal small bowel obstruction. Electronically Signed   By: Lupita Raider M.D.   On: 09/02/2018 08:34  Scheduled Meds: . bisacodyl  10 mg Rectal BID  . heparin injection (subcutaneous)  5,000 Units Subcutaneous Q8H  . insulin aspart  0-9 Units Subcutaneous Q4H  . simethicone  40 mg Oral QID  . sodium chloride flush  3 mL Intravenous Once  . sodium chloride flush  3 mL Intravenous Q12H   Continuous Infusions: . lactated ringers 125 mL/hr at 09/02/18 0252  . magnesium sulfate bolus IVPB    . potassium chloride       LOS: 8 days      Calvert Cantor, MD [facility] Pager: [email or web address] Password [facility] 09/02/2018, 10:41 AM

## 2018-09-02 NOTE — Progress Notes (Signed)
[facility] Progress Note  8 Days Post-Op  Subjective: Patient SBFT not conclusive because the contrast does not make past the stomach. Diffusely dilated small bowel and also gas in the colon.  Asked Dr. Karilyn Cotaehman to look at patient's chart and give his opinion.  He has looked at the chart and the SBFT and his history. There is gas in the colon and small bowel so we are more worried about him having an ileus and probably some underlying motility issues.    Dr. Karilyn Cotaehman has recommended getting patient to the bedside commode to evacuate any gas once a shift. He has recommended simethicone to see if this will help with the gastric distention. He has recommended ordering albumin to see if he needs any replacements which could be causing edema of the bowel.    Clinically yesterday the patient had 2 BMs without any suppository given.   Objective: Vital signs in last 24 hours: Temp:  [97.7 F (36.5 C)-98.5 F (36.9 C)] 97.7 F (36.5 C) (05/19 0417) Pulse Rate:  [71-81] 81 (05/19 0417) Resp:  [18] 18 (05/18 1400) BP: (110-123)/(59-79) 110/59 (05/19 0417) SpO2:  [93 %-98 %] 96 % (05/19 0417) Last BM Date: 09/02/18  Intake/Output from previous day: 05/18 0701 - 05/19 0700 In: 1858.6 [I.V.:1345.1; IV Piggyback:513.5] Out: 1845 [Urine:1550; Emesis/NG output:200; Drains:95] Intake/Output this shift: No intake/output data recorded.  General appearance: alert and no distress Resp: normal work of breathing GI: soft, distended, less tympanic, staples c/d/i with ecchymosis no drainage or erythema  Lab Results:  No results for input(s): WBC, HGB, HCT, PLT in the last 72 hours. BMET Recent Labs    09/01/18 0617 09/02/18 0515  NA 146* 143  K 3.2* 3.4*  CL 110 108  CO2 27 24  GLUCOSE 114* 114*  BUN 21 23  CREATININE 1.06 1.07  CALCIUM 8.3* 8.1*   Personally reviewed SBFT- gastric distention and small bowel and colon outlined with gas, contrast is not moving past the  stomach Studies/Results: Dg Small Bowel W Single Cm (sol Or Thin Ba)  Result Date: 09/02/2018 CLINICAL DATA:  Small bowel obstruction. EXAM: SMALL BOWEL SERIES COMPARISON:  Radiographs of Aug 27, 2018. TECHNIQUE: Following ingestion of water-soluble contrast, serial small bowel images were obtained. No spot images were obtained. 50 mL of water-soluble contrast was mixed with 50 mL of water and administered through pre-existing nasogastric tube. Diffuse small bowel dilatation is noted concerning for ileus or distal small bowel obstruction. No significant colonic dilatation is noted. The initial images demonstrate contrast remaining within the proximal stomach. The final image was obtained 16 hours after the administration of contrast. No contrast is visualized in the small or large bowel on this study. IMPRESSION: Diffuse small bowel dilatation is noted. The contrast administered through nasogastric tube with seen to pool within the stomach and not advance on the initial images. Delayed images demonstrate no contrast present in the large or small bowel. These findings are consistent with either ileus or distal small bowel obstruction. Electronically Signed   By: Lupita RaiderJames  Green Jr M.D.   On: 09/02/2018 08:34    Assessment/Plan: [name] is a [age] yoPOD8 s/p Ex lap, reduction of volvulus for a high grade small bowel obstruction. No adhesions or scarring causing obstruction.  Patient with BMs but not evacuating the gas.  Have discussed patient with Dr.Rehman and he has looked at the chart. His input is much appreciated.  I do not think he is obstructed but has some pro-molitity issue  with additional of some post op ileus.  -Clear trial for now, LFTs with albumin and prealbumin ordered, will give albumin if low as it could be causing some edema of the bowel, simethicone ordered QID, bedside commode and sitting on commode every shift to evacuate gas if possible  -Continue suppository BID -Continue chole tube  care -Binder in place -AKI improving, K and Mg replaced  -SCDs, heparin sq   LOS: 8 days    [name] 09/02/2018

## 2018-09-02 NOTE — Progress Notes (Signed)
Patient lying in bed at this time, alert and oriented to self. Noted to be calm, cooperative & not pulling at IV lines or foley. No need for further restraint at this time, order expired and not renewed at this time. Monitoring continues.

## 2018-09-03 DIAGNOSIS — F79 Unspecified intellectual disabilities: Secondary | ICD-10-CM | POA: Diagnosis not present

## 2018-09-03 DIAGNOSIS — K56609 Unspecified intestinal obstruction, unspecified as to partial versus complete obstruction: Secondary | ICD-10-CM | POA: Diagnosis not present

## 2018-09-03 DIAGNOSIS — K567 Ileus, unspecified: Secondary | ICD-10-CM | POA: Diagnosis not present

## 2018-09-03 DIAGNOSIS — K562 Volvulus: Secondary | ICD-10-CM

## 2018-09-03 DIAGNOSIS — N179 Acute kidney failure, unspecified: Secondary | ICD-10-CM | POA: Diagnosis not present

## 2018-09-03 LAB — BASIC METABOLIC PANEL
Anion gap: 3 — ABNORMAL LOW (ref 5–15)
BUN: 14 mg/dL (ref 8–23)
CO2: 29 mmol/L (ref 22–32)
Calcium: 7.9 mg/dL — ABNORMAL LOW (ref 8.9–10.3)
Chloride: 110 mmol/L (ref 98–111)
Creatinine, Ser: 0.98 mg/dL (ref 0.61–1.24)
GFR calc Af Amer: >60 mL/min (ref >60)
GFR calc non Af Amer: >60 mL/min (ref >60)
Glucose, Bld: 130 mg/dL — ABNORMAL HIGH (ref 70–99)
Potassium: 3.1 mmol/L — ABNORMAL LOW (ref 3.5–5.1)
Sodium: 142 mmol/L (ref 135–145)

## 2018-09-03 LAB — GLUCOSE, CAPILLARY
Glucose-Capillary: 121 mg/dL — ABNORMAL HIGH (ref 70–99)
Glucose-Capillary: 125 mg/dL — ABNORMAL HIGH (ref 70–99)
Glucose-Capillary: 128 mg/dL — ABNORMAL HIGH (ref 70–99)
Glucose-Capillary: 137 mg/dL — ABNORMAL HIGH (ref 70–99)
Glucose-Capillary: 152 mg/dL — ABNORMAL HIGH (ref 70–99)
Glucose-Capillary: 158 mg/dL — ABNORMAL HIGH (ref 70–99)
Glucose-Capillary: 181 mg/dL — ABNORMAL HIGH (ref 70–99)

## 2018-09-03 LAB — MAGNESIUM: Magnesium: 2 mg/dL (ref 1.7–2.4)

## 2018-09-03 LAB — PHOSPHORUS: Phosphorus: 2.2 mg/dL — ABNORMAL LOW (ref 2.5–4.6)

## 2018-09-03 MED ORDER — POTASSIUM PHOSPHATE MONOBASIC 500 MG PO TABS
500.0000 mg | ORAL_TABLET | Freq: Three times a day (TID) | ORAL | Status: DC
  Filled 2018-09-03 (×6): qty 1

## 2018-09-03 MED ORDER — POTASSIUM CHLORIDE 20 MEQ PO PACK
40.0000 meq | PACK | Freq: Two times a day (BID) | ORAL | Status: AC
  Administered 2018-09-03 (×2): 40 meq via ORAL
  Filled 2018-09-03 (×2): qty 2

## 2018-09-03 NOTE — Progress Notes (Signed)
[facility] Progress Note  9 Days Post-Op  Subjective: Looking better. Less distended. RN yesterday afternoon reported more BMs that recorded, probably like 4-5 at least as the patient had BMs in the chair and bed. Tolerated diet of fulls last night. Getting up with PT and ambulating.   Objective: Vital signs in last 24 hours: Temp:  [98 F (36.7 C)-98.5 F (36.9 C)] 98.5 F (36.9 C) (05/20 0500) Pulse Rate:  [68-72] 68 (05/20 0500) Resp:  [16-17] 16 (05/20 0500) BP: (103-116)/(56-66) 116/62 (05/20 0500) SpO2:  [98 %-99 %] 99 % (05/19 2153) Weight:  [78.1 kg] 78.1 kg (05/20 0500) Last BM Date: 09/02/18  Intake/Output from previous day: 05/19 0701 - 05/20 0700 In: 240 [P.O.:240] Out: 1900 [Urine:800; Drains:1100] Intake/Output this shift: Total I/O In: 240 [P.O.:240] Out: 1400 [Urine:1400]  General appearance: alert and no distress Resp: normal work of breathing GI: soft, less distended, staples c/d/i wtih bruising, no erythema or drainage  Lab Results:  No results for input(s): WBC, HGB, HCT, PLT in the last 72 hours. BMET Recent Labs    09/02/18 0515 09/03/18 0452  NA 143 142  K 3.4* 3.1*  CL 108 110  CO2 24 29  GLUCOSE 114* 130*  BUN 23 14  CREATININE 1.07 0.98  CALCIUM 8.1* 7.9*   PT/INR No results for input(s): LABPROT, INR in the last 72 hours.  Studies/Results: Dg Small Bowel W Single Cm (sol Or Thin Ba)  Result Date: 09/02/2018 CLINICAL DATA:  Small bowel obstruction. EXAM: SMALL BOWEL SERIES COMPARISON:  Radiographs of Aug 27, 2018. TECHNIQUE: Following ingestion of water-soluble contrast, serial small bowel images were obtained. No spot images were obtained. 50 mL of water-soluble contrast was mixed with 50 mL of water and administered through pre-existing nasogastric tube. Diffuse small bowel dilatation is noted concerning for ileus or distal small bowel obstruction. No significant colonic dilatation is noted. The initial images  demonstrate contrast remaining within the proximal stomach. The final image was obtained 16 hours after the administration of contrast. No contrast is visualized in the small or large bowel on this study. IMPRESSION: Diffuse small bowel dilatation is noted. The contrast administered through nasogastric tube with seen to pool within the stomach and not advance on the initial images. Delayed images demonstrate no contrast present in the large or small bowel. These findings are consistent with either ileus or distal small bowel obstruction. Electronically Signed   By: Lupita Raider M.D.   On: 09/02/2018 08:34    Anti-infectives: Anti-infectives (From admission, onward)   Start     Dose/Rate Route Frequency Ordered Stop   08/26/18 1100  cefoTEtan (CEFOTAN) 2 g in sodium chloride 0.9 % 100 mL IVPB     2 g 200 mL/hr over 30 Minutes Intravenous Every 12 hours 08/26/18 0301 08/28/18 0454   08/26/18 0600  piperacillin-tazobactam (ZOSYN) IVPB 3.375 g  Status:  Discontinued     3.375 g 12.5 mL/hr over 240 Minutes Intravenous Every 8 hours 08/25/18 2347 08/26/18 0301   08/26/18 0000  piperacillin-tazobactam (ZOSYN) IVPB 3.375 g  Status:  Discontinued     3.375 g 100 mL/hr over 30 Minutes Intravenous  Once 08/25/18 2347 08/26/18 0301      Assessment/Plan: [name] is a 75 yoPOD9s/p Ex lap, reduction of volvulus for a high grade small bowel obstruction. No adhesions or scarring causing obstruction.  Having Bms and tolerated full liquids last night. No nausea or vomiting reported. -Soft diet this AM -Albumin low yesterday,  Dr. Butler Denmarkizwan helped us replace it as low albumin can increase bowel edema -Simethicone QID, continue for the distention, seems to be helping  -Continue bedside commode and getting patient out of bed every shift to try to use the restroom  -Continue suppository BID -Continue chole tube care -Binder in place -AKI improving, K replaced orally, consider stopping IVF, will discuss with  Dr. Arbutus Leasat given the AKI  -Potentially to SNF in the next day or so if continues to improve  -SCDs, heparin sq   LOS: 9 days    [name] 09/03/2018 '

## 2018-09-03 NOTE — Care Management Important Message (Signed)
Important Message  Patient Details  Name: [name] MRN: [id number] Date of Birth: 1950-11-14   Medicare Important Message Given:  Yes    [name] 09/03/2018, 11:19 AM

## 2018-09-03 NOTE — Progress Notes (Signed)
Physical Therapy Treatment Patient Details Name: [name] MRN: [id number] DOB: [birth year] Today's Date: 09/03/2018    History of Present Illness  [name] is a 68 y.o. male s/p Exploratory laparotomy with reduction of volvulus 08/26/18 with medical history significant for intellectual disability, type 2 diabetes mellitus, BPH, and admission in February for acute gangrenous cholecystitis now with percutaneous drain in place, presenting to the emergency department for evaluation of abdominal pain, anorexia, nausea, and nonbloody vomiting.  Patient is nonverbal and is accompanied by his caretaker who provides the history.  He has been rubbing his abdomen and grimacing today after a few days of progressive abdominal distention.  He is not eating or drinking anything today despite encouragement to do so, and has now developed right current vomiting.  His last bowel movement was 4 days ago.  Patient had a small bowel obstruction during his admission in February that fortunately resolved with conservative management.  Per report of his caretaker, he has not been coughing and has not appeared to be short of breath.    PT Comments    Pt non-verbal though communicated through session with nods and head shakes.  Pt with min A for bed mobility, transfers and increased A required for safety with gait.  Pt with tendency to push down onto RW and too far away.  Multimodal cueing required to improve safe mechanics with gait to reduce risk of fall.  EOS pt returned to bed iwht mittens per RN due to pulling on IV.     Follow Up Recommendations  SNF;Supervision/Assistance - 24 hour;Supervision for mobility/OOB     Equipment Recommendations  None recommended by PT    Recommendations for Other Services       Precautions / Restrictions Precautions Precautions: Fall Restrictions Weight Bearing Restrictions: No    Mobility  Bed Mobility Overal bed mobility: Needs Assistance Bed Mobility: Supine  to Sit     Supine to sit: Supervision     General bed mobility comments: increased time  Transfers Overall transfer level: Needs assistance Equipment used: Rolling walker (2 wheeled) Transfers: Sit to/from Stand Sit to Stand: Min assist         General transfer comment: increased time, labored movement  Ambulation/Gait Ambulation/Gait assistance: Min assist;Mod assist Gait Distance (Feet): 30 Feet Assistive device: Rolling walker (2 wheeled) Gait Pattern/deviations: Decreased step length - right;Decreased step length - left;Decreased stride length     General Gait Details: continues to lean over RW pushing walker to far in front requiring verbal/tactile cueing to step closer to walker in order to prevent falling, demonstrates increased endurance/distance for taking steps   Stairs             Wheelchair Mobility    Modified Rankin (Stroke Patients Only)       Balance                                            Cognition Arousal/Alertness: Awake/alert Behavior During Therapy: WFL for tasks assessed/performed Overall Cognitive Status: History of cognitive impairments - at baseline                                 General Comments: pt non-verbal though answered questions with node/shake; follows directions consistently requiring occasional repeated verbal/tactile cueing      Exercises  General Comments        Pertinent Vitals/Pain Pain Assessment: No/denies pain    Home Living                      Prior Function            PT Goals (current goals can now be found in the care plan section)      Frequency    Min 5X/week      PT Plan Current plan remains appropriate    Co-evaluation              AM-PAC PT "6 Clicks" Mobility   Outcome Measure  Help needed turning from your back to your side while in a flat bed without using bedrails?: None Help needed moving from lying on your back to  sitting on the side of a flat bed without using bedrails?: None Help needed moving to and from a bed to a chair (including a wheelchair)?: A Little Help needed standing up from a chair using your arms (e.g., wheelchair or bedside chair)?: A Little Help needed to walk in hospital room?: A Lot Help needed climbing 3-5 steps with a railing? : A Lot 6 Click Score: 18    End of Session Equipment Utilized During Treatment: Gait belt;Oxygen Activity Tolerance: Patient tolerated treatment well;Patient limited by fatigue Patient left: in bed;with call bell/phone within reach;with chair alarm set;Other (comment)(mittens placed back on bed per RN request) Nurse Communication: Mobility status PT Visit Diagnosis: Unsteadiness on feet (R26.81);Other abnormalities of gait and mobility (R26.89);Muscle weakness (generalized) (M62.81)     Time: 3832-9191 PT Time Calculation (min) (ACUTE ONLY): 18 min  Charges:  $Therapeutic Activity: 8-22 mins                    [address] LPTA; CBIS [phone]   Juel Burrow 09/03/2018, 4:09 PM

## 2018-09-03 NOTE — Progress Notes (Signed)
PROGRESS NOTE  [name]:[id number] DOB: 1950-09-07 DOA: 08/25/2018 PCP: Pearson Grippe, MD  Brief History:  67 y.o.malewith medical history significant forintellectual disability, type 2 diabetes mellitus, BPH, and admission in February for acute gangrenous cholecystitis now with percutaneous drain in place, presenting to the emergency department for evaluation of abdominal pain, anorexia, nausea, and nonbloody vomiting. CT abdomen/pelvis is concerning for high-grade small bowel obstruction with pneumatosis of mesenteric vessels concerning for ischemia.  He was taken to the OR on 08/26/18 for ex lap and was found to have a volvulus which was reduced. Post operatively, he has developed an ileus which has led to a prolonged hospitalization   Assessment/Plan:   SBO (small bowel obstruction) due to volvulus s/p reduction -08/26/18 Ex lap--reduction of volvulus -now has post op ileus - has pulled out NG tube and for now, General surgery has decided to leave it out - abdomen still remains distended but pt had 4 BMs last 24 hours   - appreciate management per general sugery- -continue soft diet per general surgery -continue bisacodyl supp    Active Problems: AKI - likely due to vomiting and obstruction -saline lock now that pt is taking po -resolved -serum creatinine peaked 1.92  Hypokalemia, hypophosphatemia - being replaced -add Kphos    Intellectual disability - aunt often sitting at bedside to help him remain calm    Diabetes mellitus type 2, controlled - on SSI QID -04/09/28 A1C--8.0 -am A1C  Gangrenous cholecystitis - has a cholecystostomy drain    Disposition Plan:   SNF when cleared by general surgery Family Communication:   Aunt updated at bedside  Consultants:  General surgery  Code Status:  FULL   DVT Prophylaxis:  [place] Heparin / [place] Lovenox   Procedures: As Listed in Progress Note  Above  Antibiotics: None       Subjective: ROS limited due to patient's mental capacity.  No vomiting.  Tolerating soft diet.  No cp, sob, abd pain  Objective: Vitals:   09/02/18 1503 09/02/18 2153 09/03/18 0500 09/03/18 1310  BP: 107/66 (!) 103/56 116/62 (!) 97/43  Pulse: 71 72 68 79  Resp: Temp: 98 F (36.7 C) 98.2 F (36.8 C) 98.5 F (36.9 C) 97.8 F (36.6 C)  TempSrc: Oral Oral Oral Oral  SpO2: 98% 99%  98%  Weight:   78.1 kg   Height:        Intake/Output Summary (Last 24 hours) at 09/03/2018 1722 Last data filed at 09/03/2018 1658 Gross per 24 hour  Intake 3513.93 ml  Output 2300 ml  Net 1213.93 ml   Weight change:  Exam:   General:  Pt is alert, follows commands appropriately, not in acute distress  HEENT: No icterus, No thrush, No neck mass, [place]/AT  Cardiovascular: RRR, S1/S2, no rubs, no gallops  Respiratory: bibasilar rales. No wheeze  Abdomen: Soft/+BS, non tender, mildly distended, no guarding  Extremities: No edema, No lymphangitis, No petechiae, No rashes, no synovitis   Data Reviewed: I have personally reviewed following labs and imaging studies Basic Metabolic Panel: Recent Labs  Lab 08/30/18 0452 08/31/18 0554 09/01/18 0617 09/02/18 0515 09/03/18 0452  NA 144 146* 146* 143 142  K 3.7 3.4* 3.2* 3.4* 3.1*  CL 109 110 110 108 110  CO2 GLUCOSE 123* 121* 114* 114* 130*  BUN 34* 28* CREATININE 1.40* 1.26* 1.06 1.07  0.98  CALCIUM 8.9 8.4* 8.3* 8.1* 7.9*  MG 1.9 1.9 1.8 1.8 2.0  PHOS 3.3 2.2* 2.7 2.5 2.2*   Liver Function Tests: Recent Labs  Lab 09/02/18 0515  AST 13*  ALT 10  ALKPHOS 50  BILITOT 0.8  PROT 5.4*  ALBUMIN 2.4*   No results for input(s): LIPASE, AMYLASE in the last 168 hours. No results for input(s): AMMONIA in the last 168 hours. Coagulation Profile: No results for input(s): INR, PROTIME in the last 168 hours. CBC: Recent Labs  Lab 08/28/18 0442  WBC 11.7*   NEUTROABS 10.1*  HGB 13.0  HCT 40.9  MCV 91.1  PLT 220   Cardiac Enzymes: No results for input(s): CKTOTAL, CKMB, CKMBINDEX, TROPONINI in the last 168 hours. BNP: Invalid input(s): POCBNP CBG: Recent Labs  Lab 09/03/18 0411 09/03/18 0720 09/03/18 1108 09/03/18 1208 09/03/18 1559  GLUCAP 128* 125* 121* 158* 181*   HbA1C: No results for input(s): HGBA1C in the last 72 hours. Urine analysis:    Component Value Date/Time   COLORURINE YELLOW 08/25/2018 1958   APPEARANCEUR CLOUDY (A) 08/25/2018 1958   LABSPEC 1.033 (H) 08/25/2018 1958   PHURINE 5.0 08/25/2018 1958   GLUCOSEU NEGATIVE 08/25/2018 1958   HGBUR MODERATE (A) 08/25/2018 1958   BILIRUBINUR NEGATIVE 08/25/2018 1958   KETONESUR NEGATIVE 08/25/2018 1958   PROTEINUR 30 (A) 08/25/2018 1958   UROBILINOGEN 1.0 07/20/2014 1718   NITRITE NEGATIVE 08/25/2018 1958   LEUKOCYTESUR LARGE (A) 08/25/2018 1958   Sepsis Labs: (procalcitonin:4,lacticidven:4) ) Recent Results (from the past 240 hour(s))  SARS Coronavirus 2 (CEPHEID - Performed in [facility] hospital lab), Hosp Order     Status: None   Collection Time: 08/25/18 10:30 PM  Result Value Ref Range Status   SARS Coronavirus 2 NEGATIVE NEGATIVE Final    Comment: (NOTE) If result is NEGATIVE SARS-CoV-2 target nucleic acids are NOT DETECTED. The SARS-CoV-2 RNA is generally detectable in upper and lower  respiratory specimens during the acute phase of infection. The lowest  concentration of SARS-CoV-2 viral copies this assay can detect is 250  copies / mL. A negative result does not preclude SARS-CoV-2 infection  and should not be used as the sole basis for treatment or other  patient management decisions.  A negative result may occur with  improper specimen collection / handling, submission of specimen other  than nasopharyngeal swab, presence of viral mutation(s) within the  areas targeted by this assay, and inadequate number of viral copies  (<250  copies / mL). A negative result must be combined with clinical  observations, patient history, and epidemiological information. If result is POSITIVE SARS-CoV-2 target nucleic acids are DETECTED. The SARS-CoV-2 RNA is generally detectable in upper and lower  respiratory specimens dur ing the acute phase of infection.  Positive  results are indicative of active infection with SARS-CoV-2.  Clinical  correlation with patient history and other diagnostic information is  necessary to determine patient infection status.  Positive results do  not rule out bacterial infection or co-infection with other viruses. If result is PRESUMPTIVE POSTIVE SARS-CoV-2 nucleic acids MAY BE PRESENT.   A presumptive positive result was obtained on the submitted specimen  and confirmed on repeat testing.  While 2019 novel coronavirus  (SARS-CoV-2) nucleic acids may be present in the submitted sample  additional confirmatory testing may be necessary for epidemiological  and / or clinical management purposes  to differentiate between  SARS-CoV-2 and other Sarbecovirus currently known to infect humans.  If clinically  indicated additional testing with an alternate test  methodology [phone]) is advised. The SARS-CoV-2 RNA is generally  detectable in upper and lower respiratory sp ecimens during the acute  phase of infection. The expected result is Negative. Fact Sheet for Patients:  BoilerBrush.com.cy Fact Sheet for Healthcare Providers: https://pope.com/ This test is not yet approved or cleared by the [place] FDA and has been authorized for detection and/or diagnosis of SARS-CoV-2 by FDA under an Emergency Use Authorization (EUA).  This EUA will remain in effect (meaning this test can be used) for the duration of the COVID-19 declaration under Section 564(b)(1) of the Act, 21 U.S.C. section 360bbb-3(b)(1), unless the authorization is terminated or revoked  sooner. Performed at [facility], [address] [place], [place] [address]   MRSA PCR Screening     Status: None   Collection Time: 08/26/18  2:44 AM  Result Value Ref Range Status   MRSA by PCR NEGATIVE NEGATIVE Final    Comment:        The GeneXpert MRSA Assay (FDA approved for NASAL specimens only), is one component of a comprehensive MRSA colonization surveillance program. It is not intended to diagnose MRSA infection nor to guide or monitor treatment for MRSA infections. Performed at [facility], [address] [place], [place] [address]      Scheduled Meds:  bisacodyl  10 mg Rectal BID   heparin injection (subcutaneous)  5,000 Units Subcutaneous Q8H   insulin aspart  0-9 Units Subcutaneous Q4H   potassium chloride  40 mEq Oral BID   simethicone  40 mg Oral QID   sodium chloride flush  3 mL Intravenous Q12H   Continuous Infusions:  albumin human 25 g (09/03/18 1554)   lactated ringers 125 mL/hr at 09/02/18 4782    Procedures/Studies: Ct Abdomen Pelvis W Contrast  Result Date: 08/25/2018 CLINICAL DATA:  68 year old male with abdominal distension and pain and nausea. EXAM: CT ABDOMEN AND PELVIS WITH CONTRAST TECHNIQUE: Multidetector CT imaging of the abdomen and pelvis was performed using the standard protocol following bolus administration of intravenous contrast. CONTRAST:  OMNIPAQUE IOHEXOL 300 MG/ML  SOLN COMPARISON:  CT of the abdomen pelvis dated 05/23/2018 FINDINGS: Lower chest: Bibasilar dependent atelectatic changes noted. There is a 4 mm left lower lobe subpleural nodule. No definite intra-abdominal free air. No free fluid. Hepatobiliary: The liver is unremarkable. The gallbladder is decompressed around a percutaneous cholecystostomy tube. Pancreas: Unremarkable. No pancreatic ductal dilatation or surrounding inflammatory changes. Spleen: Normal in size without focal abnormality. Adrenals/Urinary Tract: The adrenal glands are unremarkable. Multiple  large and staghorn left renal calculi noted measuring up to 17 mm in the interpolar aspect of the kidney. There is a 16 mm stone or cluster of stones in the left renal pelvis with partial obstruction of the UPJ and resulting mild left hydronephrosis. There is no hydronephrosis on the right. The visualized ureters appear unremarkable. There are several stones within the urinary bladder. There is diffuse trabeculation of the bladder wall likely related to chronic bladder outlet obstruction or chronic inflammation. Stomach/Bowel: The stomach is distended with air and ingested content. There is retained ingested material within the visualized esophagus which may represent reflux or delayed esophageal clearance and dysmotility. There is mild thickening of the gastroesophageal junction which may be related to chronic and recurrent reflux. There are multiple dilated loops of small bowel with air-fluid levels measuring up to 7.5 cm in diameter. There is a probable transition in the mid abdomen (series 2, image 60  and coronal series 5, image 51). There is large amount of stool throughout the colon. Vascular/Lymphatic: There is gas within mesenteric vessels (series 2, image 49 and 47 and 50) concerning for bowel ischemia. Clinical correlation and surgical consult is advised. No gas is noted within the portal veins. The abdominal aorta is unremarkable. The origins of the celiac axis, SMA, IMA are patent. The SMV, splenic vein, and main portal vein are patent. There is no adenopathy. Reproductive: Multiple fiducial markers noted within the prostate gland. Other: None Musculoskeletal: Degenerative changes of the spine. No acute osseous pathology. IMPRESSION: 1. High-grade small bowel obstruction with pneumatosis of the mesenteric vessels concerning for bowel ischemia. Clinical correlation and surgical consult is advised. 2. Distended stomach with ingested content in the distal esophagus. 3. Multiple nonobstructing left renal  calculi. A partially obstructing stone or string of adjacent stones in the left UPJ with associated mild left hydronephrosis. Multiple bladder calculi. 4. Percutaneous cholecystostomy tube with decompression of the gallbladder. These results were called by telephone at the time of interpretation on 08/25/2018 at 9:58 pm to Dr. Raeford Razor , who verbally acknowledged these results. Electronically Signed   By: Elgie Collard M.D.   On: 08/25/2018 22:00   Dg Small Bowel W Single Cm (sol Or Thin Ba)  Result Date: 09/02/2018 CLINICAL DATA:  Small bowel obstruction. EXAM: SMALL BOWEL SERIES COMPARISON:  Radiographs of Aug 27, 2018. TECHNIQUE: Following ingestion of water-soluble contrast, serial small bowel images were obtained. No spot images were obtained. 50 mL of water-soluble contrast was mixed with 50 mL of water and administered through pre-existing nasogastric tube. Diffuse small bowel dilatation is noted concerning for ileus or distal small bowel obstruction. No significant colonic dilatation is noted. The initial images demonstrate contrast remaining within the proximal stomach. The final image was obtained 16 hours after the administration of contrast. No contrast is visualized in the small or large bowel on this study. IMPRESSION: Diffuse small bowel dilatation is noted. The contrast administered through nasogastric tube with seen to pool within the stomach and not advance on the initial images. Delayed images demonstrate no contrast present in the large or small bowel. These findings are consistent with either ileus or distal small bowel obstruction. Electronically Signed   By: Lupita Raider M.D.   On: 09/02/2018 08:34   Dg Chest Port 1 View  Result Date: 08/30/2018 CLINICAL DATA:  Bedside nasogastric tube placement. EXAM: PORTABLE CHEST 1 VIEW COMPARISON:  08/25/2018 and earlier. FINDINGS: Nasogastric tube looped in the stomach and its tip appears to be within the proximal gastric body. Small  bowel distension in the visualized upper abdomen has improved since the examination 5 days ago. Cholecystostomy tube in the RIGHT UPPER QUADRANT again noted. Suboptimal inspiration with bibasilar atelectasis, unchanged. Lungs remain clear otherwise. Pulmonary vascularity normal. IMPRESSION: 1. Nasogastric tube looped in the stomach and its tip appears to be within the proximal gastric body. 2. Suboptimal inspiration with bibasilar atelectasis, unchanged. 3. No new pulmonary abnormalities. Electronically Signed   By: Hulan Saas M.D.   On: 08/30/2018 15:53   Dg Chest Port 1 View  Result Date: 08/25/2018 CLINICAL DATA:  Preoperative imaging EXAM: PORTABLE CHEST 1 VIEW COMPARISON:  None. FINDINGS: Shallow lung inflation with bibasilar atelectasis. No focal airspace consolidation. Cardiomediastinal contours are normal. IMPRESSION: No acute airspace disease. Electronically Signed   By: Deatra Robinson M.D.   On: 08/25/2018 23:09   Dg Abd Portable 2v  Result Date: 08/27/2018 CLINICAL DATA:  Ileus.  Recent exploratory laparotomy with reduction of small bowel volvulus. EXAM: PORTABLE ABDOMEN - 2 VIEW COMPARISON:  CT abdomen and pelvis 08/25/2018 FINDINGS: Intraperitoneal free air is consistent with recent surgery. A percutaneous cholecystostomy tube is again noted in the right upper abdomen. There is prominent gaseous dilatation of multiple small bowel loops in the upper and mid abdomen. Stool is present in nondilated colon and rectum. There is also moderate gaseous distension of the stomach. Skin staples and prostate fiducials are noted. The visualized lung bases are grossly clear within limitations of motion artifact. IMPRESSION: Prominent small bowel dilatation which may reflect postoperative ileus or recurrent obstruction. Electronically Signed   By: Sebastian AcheAllen  Grady M.D.   On: 08/27/2018 17:17    Catarina Hartshornavid Terri Rorrer, DO  [facility] Pager [phone]  If 7PM-7AM, please contact  night-coverage [email or web address] Password [email or web address] 09/03/2018, 5:22 PM   LOS: 9 days

## 2018-09-04 DIAGNOSIS — K56609 Unspecified intestinal obstruction, unspecified as to partial versus complete obstruction: Secondary | ICD-10-CM | POA: Diagnosis not present

## 2018-09-04 DIAGNOSIS — F79 Unspecified intellectual disabilities: Secondary | ICD-10-CM | POA: Diagnosis not present

## 2018-09-04 DIAGNOSIS — N179 Acute kidney failure, unspecified: Secondary | ICD-10-CM | POA: Diagnosis not present

## 2018-09-04 DIAGNOSIS — K567 Ileus, unspecified: Secondary | ICD-10-CM | POA: Diagnosis not present

## 2018-09-04 LAB — PHOSPHORUS: Phosphorus: 2.7 mg/dL (ref 2.5–4.6)

## 2018-09-04 LAB — GLUCOSE, CAPILLARY
Glucose-Capillary: 123 mg/dL — ABNORMAL HIGH (ref 70–99)
Glucose-Capillary: 129 mg/dL — ABNORMAL HIGH (ref 70–99)
Glucose-Capillary: 129 mg/dL — ABNORMAL HIGH (ref 70–99)
Glucose-Capillary: 142 mg/dL — ABNORMAL HIGH (ref 70–99)
Glucose-Capillary: 166 mg/dL — ABNORMAL HIGH (ref 70–99)

## 2018-09-04 LAB — BASIC METABOLIC PANEL
Anion gap: 8 (ref 5–15)
BUN: 8 mg/dL (ref 8–23)
CO2: 27 mmol/L (ref 22–32)
Calcium: 8.6 mg/dL — ABNORMAL LOW (ref 8.9–10.3)
Chloride: 105 mmol/L (ref 98–111)
Creatinine, Ser: 0.93 mg/dL (ref 0.61–1.24)
GFR calc Af Amer: >60 mL/min (ref >60)
GFR calc non Af Amer: >60 mL/min (ref >60)
Glucose, Bld: 144 mg/dL — ABNORMAL HIGH (ref 70–99)
Potassium: 3.6 mmol/L (ref 3.5–5.1)
Sodium: 140 mmol/L (ref 135–145)

## 2018-09-04 LAB — MAGNESIUM: Magnesium: 1.9 mg/dL (ref 1.7–2.4)

## 2018-09-04 MED ORDER — K PHOS MONO-SOD PHOS DI & MONO 155-852-130 MG PO TABS: 250.0000 mg | ORAL_TABLET | Freq: Every day | ORAL | 0 refills | Status: DC

## 2018-09-04 MED ORDER — K PHOS MONO-SOD PHOS DI & MONO 155-852-130 MG PO TABS
250.0000 mg | ORAL_TABLET | Freq: Three times a day (TID) | ORAL | Status: DC
  Administered 2018-09-04 (×2): 250 mg via ORAL
  Filled 2018-09-04 (×2): qty 1

## 2018-09-04 MED ORDER — SIMETHICONE 40 MG/0.6ML PO SUSP: 40.0000 mg | Freq: Four times a day (QID) | ORAL | 0 refills | Status: DC

## 2018-09-04 NOTE — Progress Notes (Signed)
Physical Therapy Note  Patient Details  Name: [name] MRN: [id number] Date of Birth: 05-Nov-1950 Today's Date: 09/04/2018    Pt with min A from transfer sit to stand and use of RW back to bed.  Pt with multimodal cueing for safety with use of RW to keep closer to body.  EOS pt left in bed with bed alarm set, call bell within reach and  RN in room.  [address], LPTA; CBIS [phone]  Juel Burrow 09/04/2018, 1:02 PM

## 2018-09-04 NOTE — Progress Notes (Addendum)
[facility] Progress Note  10 Days Post-Op  Subjective: Continues to have BMs. Distention remains but is significantly improved and family reports at his baseline.  He is up to the chair and has been working with PT. Tolerating his soft diet but family says he does not eat much at baseline.   Objective: Vital signs in last 24 hours: Temp:  [97.8 F (36.6 C)-98.5 F (36.9 C)] 98.5 F (36.9 C) (05/21 0537) Pulse Rate:  [70-81] 70 (05/21 1405) Resp:  [18-20] 18 (05/21 1405) BP: (102-110)/(58-60) 110/60 (05/21 1405) SpO2:  [97 %-100 %] 100 % (05/21 1405) Weight:  [77.6 kg] 77.6 kg (05/21 0433) Last BM Date: 09/02/18  Intake/Output from previous day: 05/20 0701 - 05/21 0700 In: 3978 [P.O.:600; I.V.:2914.3; IV Piggyback:463.7] Out: 3150 [Urine:3150] Intake/Output this shift: Total I/O In: 240 [P.O.:240] Out: 2 [Stool:2]  General appearance: alert, cooperative and no distress Resp: normal work of breathing GI: soft, mildly distended, non tender, staples c/d/i without erythema or drainage, bruising improving/ evolving   Lab Results:  No results for input(s): WBC, HGB, HCT, PLT in the last 72 hours. BMET Recent Labs    09/03/18 0452 09/04/18 0641  NA 142 140  K 3.1* 3.6  CL 110 105  CO2 29 27  GLUCOSE 130* 144*  BUN 14 8  CREATININE 0.98 0.93  CALCIUM 7.9* 8.6*   PT/INR No results for input(s): LABPROT, INR in the last 72 hours.  Studies/Results: No results found.  Anti-infectives: Anti-infectives (From admission, onward)   Start     Dose/Rate Route Frequency Ordered Stop   08/26/18 1100  cefoTEtan (CEFOTAN) 2 g in sodium chloride 0.9 % 100 mL IVPB     2 g 200 mL/hr over 30 Minutes Intravenous Every 12 hours 08/26/18 0301 08/28/18 0454   08/26/18 0600  piperacillin-tazobactam (ZOSYN) IVPB 3.375 g  Status:  Discontinued     3.375 g 12.5 mL/hr over 240 Minutes Intravenous Every 8 hours 08/25/18 2347 08/26/18 0301   08/26/18 0000   piperacillin-tazobactam (ZOSYN) IVPB 3.375 g  Status:  Discontinued     3.375 g 100 mL/hr over 30 Minutes Intravenous  Once 08/25/18 2347 08/26/18 0301      Assessment/Plan: Mr. Locklar is a 67 yoPOD10s/p Ex lap, reduction of volvulus for a high grade small bowel obstruction. No adhesions or scarring causing obstruction. Doing well. Post op ileus improving in setting of his normal distention/ motility issues.  -Soft diet  -Simethicone QID, would continue scheduled  -Continue bedside commode and getting patient out of bed every shift to try to use the restroom  -Continue suppository BID to help with stimulation and evacuation of gas  -Continue chole tube care -Binder in place -SCDs, heparin sq -SNF placement pending  -Will plan to see in my office for staple removal 09/11/2018 at 1:30PM; if [facility] goes to the [facility], I possibly could see him there to remove the staples if they say it is ok, will need to get permission prior to planning this    LOS: 10 days    [name] 09/04/2018

## 2018-09-04 NOTE — Discharge Summary (Signed)
Physician Discharge Summary  [name] NFA:[id number] DOB: June 05, 1950 DOA: 08/25/2018  PCP: [name], [name], MD  Admit date: 08/25/2018 Discharge date: 09/04/2018  Admitted From: Home Disposition:   SNF  Recommendations for Outpatient Follow-up:  1. Follow up with PCP in 1-2 weeks 2. Please obtain BMP/CBC in one week     Discharge Condition: Stable CODE STATUS: FULL Diet recommendation:  Soft diet   Brief/Interim Summary: 68 y.o.malewith medical history significant forintellectual disability, type 2 diabetes mellitus, BPH, and admission in February for acute gangrenous cholecystitis now with percutaneous drain in place, presenting to the emergency department for evaluation of abdominal pain, anorexia, nausea, and nonbloody vomiting. CT abdomen/pelvis is concerning for high-grade small bowel obstruction with pneumatosis of mesenteric vessels concerning for ischemia.  He was taken to the OR on 08/26/18 for ex lap and was found to have a volvulus which was reduced. Post operatively, he has developed an ileus which has led to a prolonged hospitalization.  He was placed on bowel rest and IVF.  General surgery continued to follow.  He was started on simethicone and bisacodyl suppository.  Ultimately, his bowel function gradually returned and his diet was advanced.  He continued to have a degree of bowel distension, but he family confirmed that it is baseline for the patient.    Discharge Diagnoses:  SBO (small bowel obstruction) due to volvulus s/p reduction -08/26/18 Ex lap--reduction of volvulus -now has post op ileus - has pulled out NG tube and for now, General surgery has decided to leave it out - abdomen remains distended  but pt continues to have BM and family states this is his baseline -distension overall significantly improved - appreciate management per general sugery- -continue soft diet per general surgery -continue bisacodyl supp bid after d/c -continue simethicone  four times daily -encourage ambulation -please allow pt to sit on commode at least once daily to expel air and stool -follow up at Dr. [name]' office 5/28 at 1pm to remove staples   Active Problems: AKI - likely due to vomiting and obstruction -saline lock now that pt is taking po -resolved--serum creatinine 0.93 on day of d/c -serum creatinine peaked 1.92  Hypokalemia, hypophosphatemia - being replaced -add Kphos--d/c with 250 mg once daily x one week -repeat bmp, mag, phos in one week -mag 1.9  Intellectual disability - auntoftensitting at bedside to help him remain calm  Diabetes mellitus type 2, controlled - on SSI QID -04/09/28 A1C--8.0 -restart metformin after d/c  Gangrenous cholecystitis - has a cholecystostomy drain since Feb 2020 - family empties bag tid  Urinary retention -Patient has intermittent urinary retention at home-family catheterizes him 3 times daily -Foley catheter discontinued-resume I/O catheterization. -continue tamulosin  Discharge Instructions   Allergies as of 09/04/2018   No Known Allergies     Medication List    TAKE these medications   aspirin EC 81 MG tablet Take 81 mg by mouth every morning.   feeding supplement (GLUCERNA SHAKE) Liqd Take 237 mLs by mouth 3 (three) times daily between meals.   metFORMIN 500 MG tablet Commonly known as:  GLUCOPHAGE Take 1,000 mg by mouth every morning.   phosphorus 155-852-130 MG tablet Commonly known as:  K PHOS NEUTRAL Take 1 tablet (250 mg total) by mouth daily.   simethicone 40 MG/0.6ML drops Commonly known as:  MYLICON Take 0.6 mLs (40 mg total) by mouth 4 (four) times daily.   tamsulosin 0.4 MG Caps capsule Commonly known as:  FLOMAX Take 1 capsule (0.4  mg total) by mouth every evening.   TYLENOL ARTHRITIS PAIN PO Take 1 tablet by mouth every 6 (six) hours as needed (pain).       Contact information for follow-up providers    [name], MD Follow up  on 09/11/2018.   Specialty:  General Surgery Why:  @ 1:30PM for staple removal  **IF he goes to the [facility], I can come remove the staples at the [facility], if I have permission from the Center to do so.** Please notify my office.  Contact information: [address] [place] [facility] [address] [phone]            Contact information for after-discharge care    Destination    [facility] .   Service:  Skilled Nursing Contact information: [address] [place] [address] [phone]                 No Known Allergies  Consultations:  General surgery   Procedures/Studies: Ct Abdomen Pelvis W Contrast  Result Date: 08/25/2018 CLINICAL DATA:  68 year old male with abdominal distension and pain and nausea. EXAM: CT ABDOMEN AND PELVIS WITH CONTRAST TECHNIQUE: Multidetector CT imaging of the abdomen and pelvis was performed using the standard protocol following bolus administration of intravenous contrast. CONTRAST:  OMNIPAQUE IOHEXOL 300 MG/ML  SOLN COMPARISON:  CT of the abdomen pelvis dated 05/23/2018 FINDINGS: Lower chest: Bibasilar dependent atelectatic changes noted. There is a 4 mm left lower lobe subpleural nodule. No definite intra-abdominal free air. No free fluid. Hepatobiliary: The liver is unremarkable. The gallbladder is decompressed around a percutaneous cholecystostomy tube. Pancreas: Unremarkable. No pancreatic ductal dilatation or surrounding inflammatory changes. Spleen: Normal in size without focal abnormality. Adrenals/Urinary Tract: The adrenal glands are unremarkable. Multiple large and staghorn left renal calculi noted measuring up to 17 mm in the interpolar aspect of the kidney. There is a 16 mm stone or cluster of stones in the left renal pelvis with partial obstruction of the UPJ and resulting mild left hydronephrosis. There is no hydronephrosis on the right. The visualized ureters appear  unremarkable. There are several stones within the urinary bladder. There is diffuse trabeculation of the bladder wall likely related to chronic bladder outlet obstruction or chronic inflammation. Stomach/Bowel: The stomach is distended with air and ingested content. There is retained ingested material within the visualized esophagus which may represent reflux or delayed esophageal clearance and dysmotility. There is mild thickening of the gastroesophageal junction which may be related to chronic and recurrent reflux. There are multiple dilated loops of small bowel with air-fluid levels measuring up to 7.5 cm in diameter. There is a probable transition in the mid abdomen (series 2, image 60 and coronal series 5, image 51). There is large amount of stool throughout the colon. Vascular/Lymphatic: There is gas within mesenteric vessels (series 2, image 49 and 47 and 50) concerning for bowel ischemia. Clinical correlation and surgical consult is advised. No gas is noted within the portal veins. The abdominal aorta is unremarkable. The origins of the celiac axis, SMA, IMA are patent. The SMV, splenic vein, and main portal vein are patent. There is no adenopathy. Reproductive: Multiple fiducial markers noted within the prostate gland. Other: None Musculoskeletal: Degenerative changes of the spine. No acute osseous pathology. IMPRESSION: 1. High-grade small bowel obstruction with pneumatosis of the mesenteric vessels concerning for bowel ischemia. Clinical correlation and surgical consult is advised. 2. Distended stomach with ingested content in the distal esophagus.  3. Multiple nonobstructing left renal calculi. A partially obstructing stone or string of adjacent stones in the left UPJ with associated mild left hydronephrosis. Multiple bladder calculi. 4. Percutaneous cholecystostomy tube with decompression of the gallbladder. These results were called by telephone at the time of interpretation on 08/25/2018 at 9:58 pm to  Dr. Raeford Razor , who verbally acknowledged these results. Electronically Signed   By: Elgie Collard M.D.   On: 08/25/2018 22:00   Dg Small Bowel W Single Cm (sol Or Thin Ba)  Result Date: 09/02/2018 CLINICAL DATA:  Small bowel obstruction. EXAM: SMALL BOWEL SERIES COMPARISON:  Radiographs of Aug 27, 2018. TECHNIQUE: Following ingestion of water-soluble contrast, serial small bowel images were obtained. No spot images were obtained. 50 mL of water-soluble contrast was mixed with 50 mL of water and administered through pre-existing nasogastric tube. Diffuse small bowel dilatation is noted concerning for ileus or distal small bowel obstruction. No significant colonic dilatation is noted. The initial images demonstrate contrast remaining within the proximal stomach. The final image was obtained 16 hours after the administration of contrast. No contrast is visualized in the small or large bowel on this study. IMPRESSION: Diffuse small bowel dilatation is noted. The contrast administered through nasogastric tube with seen to pool within the stomach and not advance on the initial images. Delayed images demonstrate no contrast present in the large or small bowel. These findings are consistent with either ileus or distal small bowel obstruction. Electronically Signed   By: Lupita Raider M.D.   On: 09/02/2018 08:34   Dg Chest Port 1 View  Result Date: 08/30/2018 CLINICAL DATA:  Bedside nasogastric tube placement. EXAM: PORTABLE CHEST 1 VIEW COMPARISON:  08/25/2018 and earlier. FINDINGS: Nasogastric tube looped in the stomach and its tip appears to be within the proximal gastric body. Small bowel distension in the visualized upper abdomen has improved since the examination 5 days ago. Cholecystostomy tube in the RIGHT UPPER QUADRANT again noted. Suboptimal inspiration with bibasilar atelectasis, unchanged. Lungs remain clear otherwise. Pulmonary vascularity normal. IMPRESSION: 1. Nasogastric tube looped in the  stomach and its tip appears to be within the proximal gastric body. 2. Suboptimal inspiration with bibasilar atelectasis, unchanged. 3. No new pulmonary abnormalities. Electronically Signed   By: Hulan Saas M.D.   On: 08/30/2018 15:53   Dg Chest Port 1 View  Result Date: 08/25/2018 CLINICAL DATA:  Preoperative imaging EXAM: PORTABLE CHEST 1 VIEW COMPARISON:  None. FINDINGS: Shallow lung inflation with bibasilar atelectasis. No focal airspace consolidation. Cardiomediastinal contours are normal. IMPRESSION: No acute airspace disease. Electronically Signed   By: Deatra Robinson M.D.   On: 08/25/2018 23:09   Dg Abd Portable 2v  Result Date: 08/27/2018 CLINICAL DATA:  Ileus. Recent exploratory laparotomy with reduction of small bowel volvulus. EXAM: PORTABLE ABDOMEN - 2 VIEW COMPARISON:  CT abdomen and pelvis 08/25/2018 FINDINGS: Intraperitoneal free air is consistent with recent surgery. A percutaneous cholecystostomy tube is again noted in the right upper abdomen. There is prominent gaseous dilatation of multiple small bowel loops in the upper and mid abdomen. Stool is present in nondilated colon and rectum. There is also moderate gaseous distension of the stomach. Skin staples and prostate fiducials are noted. The visualized lung bases are grossly clear within limitations of motion artifact. IMPRESSION: Prominent small bowel dilatation which may reflect postoperative ileus or recurrent obstruction. Electronically Signed   By: Sebastian Ache M.D.   On: 08/27/2018 17:17        Discharge Exam: Vitals:  09/04/18 0537 09/04/18 1405  BP: (!) 102/58 110/60  Pulse: 81 70  Resp: 20 18  Temp: 98.5 F (36.9 C)   SpO2: 100% 100%   Vitals:   09/03/18 2102 09/04/18 0433 09/04/18 0537 09/04/18 1405  BP: 107/60  (!) 102/58 110/60  Pulse: 77  81 70  Resp: 20  20 18   Temp: 97.8 F (36.6 C)  98.5 F (36.9 C)   TempSrc: Oral  Oral   SpO2: 97%  100% 100%  Weight:  77.6 kg    Height:         General: Pt is alert, awake, not in acute distress Cardiovascular: RRR, S1/S2 +, no rubs, no gallops Respiratory: fine bibasilar rales. No wheeze Abdominal: Soft, NT, ND, bowel sounds + Extremities: trace LE edema, no cyanosis   The results of significant diagnostics from this hospitalization (including imaging, microbiology, ancillary and laboratory) are listed below for reference.    Significant Diagnostic Studies: Ct Abdomen Pelvis W Contrast  Result Date: 08/25/2018 CLINICAL DATA:  68 year old male with abdominal distension and pain and nausea. EXAM: CT ABDOMEN AND PELVIS WITH CONTRAST TECHNIQUE: Multidetector CT imaging of the abdomen and pelvis was performed using the standard protocol following bolus administration of intravenous contrast. CONTRAST:  OMNIPAQUE IOHEXOL 300 MG/ML  SOLN COMPARISON:  CT of the abdomen pelvis dated 05/23/2018 FINDINGS: Lower chest: Bibasilar dependent atelectatic changes noted. There is a 4 mm left lower lobe subpleural nodule. No definite intra-abdominal free air. No free fluid. Hepatobiliary: The liver is unremarkable. The gallbladder is decompressed around a percutaneous cholecystostomy tube. Pancreas: Unremarkable. No pancreatic ductal dilatation or surrounding inflammatory changes. Spleen: Normal in size without focal abnormality. Adrenals/Urinary Tract: The adrenal glands are unremarkable. Multiple large and staghorn left renal calculi noted measuring up to 17 mm in the interpolar aspect of the kidney. There is a 16 mm stone or cluster of stones in the left renal pelvis with partial obstruction of the UPJ and resulting mild left hydronephrosis. There is no hydronephrosis on the right. The visualized ureters appear unremarkable. There are several stones within the urinary bladder. There is diffuse trabeculation of the bladder wall likely related to chronic bladder outlet obstruction or chronic inflammation. Stomach/Bowel: The stomach is distended with  air and ingested content. There is retained ingested material within the visualized esophagus which may represent reflux or delayed esophageal clearance and dysmotility. There is mild thickening of the gastroesophageal junction which may be related to chronic and recurrent reflux. There are multiple dilated loops of small bowel with air-fluid levels measuring up to 7.5 cm in diameter. There is a probable transition in the mid abdomen (series 2, image 60 and coronal series 5, image 51). There is large amount of stool throughout the colon. Vascular/Lymphatic: There is gas within mesenteric vessels (series 2, image 49 and 47 and 50) concerning for bowel ischemia. Clinical correlation and surgical consult is advised. No gas is noted within the portal veins. The abdominal aorta is unremarkable. The origins of the celiac axis, SMA, IMA are patent. The SMV, splenic vein, and main portal vein are patent. There is no adenopathy. Reproductive: Multiple fiducial markers noted within the prostate gland. Other: None Musculoskeletal: Degenerative changes of the spine. No acute osseous pathology. IMPRESSION: 1. High-grade small bowel obstruction with pneumatosis of the mesenteric vessels concerning for bowel ischemia. Clinical correlation and surgical consult is advised. 2. Distended stomach with ingested content in the distal esophagus. 3. Multiple nonobstructing left renal calculi. A partially obstructing stone or  string of adjacent stones in the left UPJ with associated mild left hydronephrosis. Multiple bladder calculi. 4. Percutaneous cholecystostomy tube with decompression of the gallbladder. These results were called by telephone at the time of interpretation on 08/25/2018 at 9:58 pm to Dr. Raeford Razor , who verbally acknowledged these results. Electronically Signed   By: Elgie Collard M.D.   On: 08/25/2018 22:00   Dg Small Bowel W Single Cm (sol Or Thin Ba)  Result Date: 09/02/2018 CLINICAL DATA:  Small bowel  obstruction. EXAM: SMALL BOWEL SERIES COMPARISON:  Radiographs of Aug 27, 2018. TECHNIQUE: Following ingestion of water-soluble contrast, serial small bowel images were obtained. No spot images were obtained. 50 mL of water-soluble contrast was mixed with 50 mL of water and administered through pre-existing nasogastric tube. Diffuse small bowel dilatation is noted concerning for ileus or distal small bowel obstruction. No significant colonic dilatation is noted. The initial images demonstrate contrast remaining within the proximal stomach. The final image was obtained 16 hours after the administration of contrast. No contrast is visualized in the small or large bowel on this study. IMPRESSION: Diffuse small bowel dilatation is noted. The contrast administered through nasogastric tube with seen to pool within the stomach and not advance on the initial images. Delayed images demonstrate no contrast present in the large or small bowel. These findings are consistent with either ileus or distal small bowel obstruction. Electronically Signed   By: Lupita Raider M.D.   On: 09/02/2018 08:34   Dg Chest Port 1 View  Result Date: 08/30/2018 CLINICAL DATA:  Bedside nasogastric tube placement. EXAM: PORTABLE CHEST 1 VIEW COMPARISON:  08/25/2018 and earlier. FINDINGS: Nasogastric tube looped in the stomach and its tip appears to be within the proximal gastric body. Small bowel distension in the visualized upper abdomen has improved since the examination 5 days ago. Cholecystostomy tube in the RIGHT UPPER QUADRANT again noted. Suboptimal inspiration with bibasilar atelectasis, unchanged. Lungs remain clear otherwise. Pulmonary vascularity normal. IMPRESSION: 1. Nasogastric tube looped in the stomach and its tip appears to be within the proximal gastric body. 2. Suboptimal inspiration with bibasilar atelectasis, unchanged. 3. No new pulmonary abnormalities. Electronically Signed   By: Hulan Saas M.D.   On: 08/30/2018  15:53   Dg Chest Port 1 View  Result Date: 08/25/2018 CLINICAL DATA:  Preoperative imaging EXAM: PORTABLE CHEST 1 VIEW COMPARISON:  None. FINDINGS: Shallow lung inflation with bibasilar atelectasis. No focal airspace consolidation. Cardiomediastinal contours are normal. IMPRESSION: No acute airspace disease. Electronically Signed   By: Deatra Robinson M.D.   On: 08/25/2018 23:09   Dg Abd Portable 2v  Result Date: 08/27/2018 CLINICAL DATA:  Ileus. Recent exploratory laparotomy with reduction of small bowel volvulus. EXAM: PORTABLE ABDOMEN - 2 VIEW COMPARISON:  CT abdomen and pelvis 08/25/2018 FINDINGS: Intraperitoneal free air is consistent with recent surgery. A percutaneous cholecystostomy tube is again noted in the right upper abdomen. There is prominent gaseous dilatation of multiple small bowel loops in the upper and mid abdomen. Stool is present in nondilated colon and rectum. There is also moderate gaseous distension of the stomach. Skin staples and prostate fiducials are noted. The visualized lung bases are grossly clear within limitations of motion artifact. IMPRESSION: Prominent small bowel dilatation which may reflect postoperative ileus or recurrent obstruction. Electronically Signed   By: Sebastian Ache M.D.   On: 08/27/2018 17:17     Microbiology: Recent Results (from the past 240 hour(s))  SARS Coronavirus 2 (CEPHEID - Performed in [facility] hospital lab), Hosp Order     Status: None   Collection Time: 08/25/18 10:30 PM  Result Value Ref Range Status   SARS Coronavirus 2 NEGATIVE NEGATIVE Final    Comment: (NOTE) If result is NEGATIVE SARS-CoV-2 target nucleic acids are NOT DETECTED. The SARS-CoV-2 RNA is generally detectable in upper and lower  respiratory specimens during the acute phase of infection. The lowest  concentration of SARS-CoV-2 viral copies this assay can detect is 250  copies / mL. A negative result does not preclude SARS-CoV-2 infection  and should not be used as  the sole basis for treatment or other  patient management decisions.  A negative result may occur with  improper specimen collection / handling, submission of specimen other  than nasopharyngeal swab, presence of viral mutation(s) within the  areas targeted by this assay, and inadequate number of viral copies  (<250 copies / mL). A negative result must be combined with clinical  observations, patient history, and epidemiological information. If result is POSITIVE SARS-CoV-2 target nucleic acids are DETECTED. The SARS-CoV-2 RNA is generally detectable in upper and lower  respiratory specimens dur ing the acute phase of infection.  Positive  results are indicative of active infection with SARS-CoV-2.  Clinical  correlation with patient history and other diagnostic information is  necessary to determine patient infection status.  Positive results do  not rule out bacterial infection or co-infection with other viruses. If result is PRESUMPTIVE POSTIVE SARS-CoV-2 nucleic acids MAY BE PRESENT.   A presumptive positive result was obtained on the submitted specimen  and confirmed on repeat testing.  While 2019 novel coronavirus  (SARS-CoV-2) nucleic acids may be present in the submitted sample  additional confirmatory testing may be necessary for epidemiological  and / or clinical management purposes  to differentiate between  SARS-CoV-2 and other Sarbecovirus currently known to infect humans.  If clinically indicated additional testing with an alternate test  methodology [phone]) is advised. The SARS-CoV-2 RNA is generally  detectable in upper and lower respiratory sp ecimens during the acute  phase of infection. The expected result is Negative. Fact Sheet for Patients:  BoilerBrush.com.cy Fact Sheet for Healthcare Providers: https://pope.com/ This test is not yet approved or cleared by the [place] FDA and has been authorized for  detection and/or diagnosis of SARS-CoV-2 by FDA under an Emergency Use Authorization (EUA).  This EUA will remain in effect (meaning this test can be used) for the duration of the COVID-19 declaration under Section 564(b)(1) of the Act, 21 U.S.C. section 360bbb-3(b)(1), unless the authorization is terminated or revoked sooner. Performed at [facility], [address] [place], [place] [address]   MRSA PCR Screening     Status: None   Collection Time: 08/26/18  2:44 AM  Result Value Ref Range Status   MRSA by PCR NEGATIVE NEGATIVE Final    Comment:        The GeneXpert MRSA Assay (FDA approved for NASAL specimens only), is one component of a comprehensive MRSA colonization surveillance program. It is not intended to diagnose MRSA infection nor to guide or monitor treatment for MRSA infections. Performed at [facility], [address], [place], [place] [address]      Labs: Basic Metabolic Panel: Recent Labs  Lab 08/31/18 0554 09/01/18 0617 09/02/18 0515 09/03/18 0452 09/04/18 0641  NA 146* 146* 143 142 140  K 3.4* 3.2* 3.4* 3.1* 3.6  CL 110 110 108 110 105  CO2 GLUCOSE  121* 114* 114* 130* 144*  BUN 28* CREATININE 1.26* 1.06 1.07 0.98 0.93  CALCIUM 8.4* 8.3* 8.1* 7.9* 8.6*  MG 1.9 1.8 1.8 2.0 1.9  PHOS 2.2* 2.7 2.5 2.2* 2.7   Liver Function Tests: Recent Labs  Lab 09/02/18 0515  AST 13*  ALT 10  ALKPHOS 50  BILITOT 0.8  PROT 5.4*  ALBUMIN 2.4*   No results for input(s): LIPASE, AMYLASE in the last 168 hours. No results for input(s): AMMONIA in the last 168 hours. CBC: No results for input(s): WBC, NEUTROABS, HGB, HCT, MCV, PLT in the last 168 hours. Cardiac Enzymes: No results for input(s): CKTOTAL, CKMB, CKMBINDEX, TROPONINI in the last 168 hours. BNP: Invalid input(s): POCBNP CBG: Recent Labs  Lab 09/04/18 0005 09/04/18 0400 09/04/18 0730 09/04/18 1130 09/04/18 1619  GLUCAP 142* 129* 129* 166* 123*    Time  coordinating discharge:  36 minutes  Signed:  Catarina Hartshorn, DO [facility] Pager: [phone] 09/04/2018, 4:45 PM

## 2018-09-04 NOTE — Progress Notes (Signed)
Nsg Discharge Note  Admit Date:  08/25/2018 Discharge date: 09/04/2018   [name] to be D/C'd to [facility] per MD order.  AVS completed.   Discharge Medication: Allergies as of 09/04/2018   No Known Allergies     Medication List    TAKE these medications   aspirin EC 81 MG tablet Take 81 mg by mouth every morning.   feeding supplement (GLUCERNA SHAKE) Liqd Take 237 mLs by mouth 3 (three) times daily between meals.   metFORMIN 500 MG tablet Commonly known as:  GLUCOPHAGE Take 1,000 mg by mouth every morning.   phosphorus 155-852-130 MG tablet Commonly known as:  K PHOS NEUTRAL Take 1 tablet (250 mg total) by mouth daily.   simethicone 40 MG/0.6ML drops Commonly known as:  MYLICON Take 0.6 mLs (40 mg total) by mouth 4 (four) times daily.   tamsulosin 0.4 MG Caps capsule Commonly known as:  FLOMAX Take 1 capsule (0.4 mg total) by mouth every evening.   TYLENOL ARTHRITIS PAIN PO Take 1 tablet by mouth every 6 (six) hours as needed (pain).       Discharge Assessment: Vitals:   09/04/18 0537 09/04/18 1405  BP: (!) 102/58 110/60  Pulse: 81 70  Resp: 20 18  Temp: 98.5 F (36.9 C)   SpO2: 100% 100%   Skin clean, dry and intact without evidence of skin break down, no evidence of skin tears noted. IV catheter discontinued intact. Site without signs and symptoms of complications - no redness or edema noted at insertion site, patient denies c/o pain - only slight tenderness at site.  Dressing with slight pressure applied.  D/c Instructions-Education: Report called and given to Gust Rung, LPN  [facility] All questions were answered and no further questions at this time. Patient in stable condition and in no acute distress at time of discharge. Patient will be escorted to [facility] by [facility] staff member.  Bethanee Redondo Salena Saner, RN 09/04/2018 5:59 PM

## 2018-09-04 NOTE — Progress Notes (Signed)
[name] catheter removed per MD order. 

## 2018-09-04 NOTE — Progress Notes (Signed)
Physical Therapy Treatment Patient Details Name: Levi Shinelbert S Axelson MRN: [id number] DOB: 1950-10-13 Today's Date: 09/04/2018    History of Present Illness  [name] is a 68 y.o. male s/p Exploratory laparotomy with reduction of volvulus 08/26/18 with medical history significant for intellectual disability, type 2 diabetes mellitus, BPH, and admission in February for acute gangrenous cholecystitis now with percutaneous drain in place, presenting to the emergency department for evaluation of abdominal pain, anorexia, nausea, and nonbloody vomiting.  Patient is nonverbal and is accompanied by his caretaker who provides the history.  He has been rubbing his abdomen and grimacing today after a few days of progressive abdominal distention.  He is not eating or drinking anything today despite encouragement to do so, and has now developed right current vomiting.  His last bowel movement was 4 days ago.  Patient had a small bowel obstruction during his admission in February that fortunately resolved with conservative management.  Per report of his caretaker, he has not been coughing and has not appeared to be short of breath.    PT Comments    Pt non-verbal though able to communicate with nodes/head shakes and able to follow instructions consistently.  Pt educated on importance of posture and appropriate mechanics for safe gait mechanics with use of RW.  Pt able to stand tall prior gait training with cueing though continues to push walker too far away and forward leaning on RW that increases his risk of fall.  EOS pt left in chair with chair alarm set, call bell within reach and his aunt in room.  No reports of pain through session.    Follow Up Recommendations  SNF;Supervision/Assistance - 24 hour;Supervision for mobility/OOB     Equipment Recommendations  None recommended by PT    Recommendations for Other Services       Precautions / Restrictions Precautions Precautions:  Fall Restrictions Weight Bearing Restrictions: No    Mobility  Bed Mobility Overal bed mobility: Needs Assistance Bed Mobility: Supine to Sit     Supine to sit: Supervision     General bed mobility comments: increased time  Transfers Overall transfer level: Needs assistance Equipment used: Rolling walker (2 wheeled) Transfers: Sit to/from Stand Sit to Stand: Min assist         General transfer comment: increased time, labored movement  Ambulation/Gait Ambulation/Gait assistance: Min assist;Mod assist Gait Distance (Feet): 40 Feet Assistive device: Rolling walker (2 wheeled) Gait Pattern/deviations: Decreased step length - right;Decreased step length - left;Decreased stride length Gait velocity: decreased   General Gait Details: continues to lean over RW pushing walker to far in front requiring verbal/tactile cueing to step closer to walker in order to prevent falling, demonstrates increased endurance/distance for taking steps   Stairs             Wheelchair Mobility    Modified Rankin (Stroke Patients Only)       Balance                                            Cognition Arousal/Alertness: Awake/alert Behavior During Therapy: WFL for tasks assessed/performed Overall Cognitive Status: History of cognitive impairments - at baseline                                 General Comments: pt non-verbal though  answered questions with node/shake; follows directions consistently      Exercises      General Comments        Pertinent Vitals/Pain Pain Assessment: No/denies pain    Home Living                      Prior Function            PT Goals (current goals can now be found in the care plan section)      Frequency    Min 5X/week      PT Plan Current plan remains appropriate    Co-evaluation              AM-PAC PT "6 Clicks" Mobility   Outcome Measure  Help needed turning from your  back to your side while in a flat bed without using bedrails?: None Help needed moving from lying on your back to sitting on the side of a flat bed without using bedrails?: None Help needed moving to and from a bed to a chair (including a wheelchair)?: A Little Help needed standing up from a chair using your arms (e.g., wheelchair or bedside chair)?: A Little Help needed to walk in hospital room?: A Lot Help needed climbing 3-5 steps with a railing? : A Lot 6 Click Score: 18    End of Session Equipment Utilized During Treatment: Gait belt Activity Tolerance: Patient tolerated treatment well;Patient limited by fatigue Patient left: in chair;with chair alarm set;with family/visitor present;with call bell/phone within reach Nurse Communication: Mobility status PT Visit Diagnosis: Unsteadiness on feet (R26.81);Other abnormalities of gait and mobility (R26.89);Muscle weakness (generalized) (M62.81)     Time: 9244-6286 PT Time Calculation (min) (ACUTE ONLY): 21 min  Charges:  $Therapeutic Exercise: 8-22 mins                     [address], LPTA; CBIS [phone]  Juel Burrow 09/04/2018, 11:46 AM

## 2018-09-05 ENCOUNTER — Emergency Department (HOSPITAL_COMMUNITY): Payer: Medicare Other

## 2018-09-05 ENCOUNTER — Encounter (HOSPITAL_COMMUNITY): Payer: Self-pay

## 2018-09-05 ENCOUNTER — Other Ambulatory Visit: Payer: Self-pay

## 2018-09-05 ENCOUNTER — Inpatient Hospital Stay (HOSPITAL_COMMUNITY)
Admission: EM | Admit: 2018-09-05 | Discharge: 2018-09-09 | DRG: 394 | Disposition: A | Discharge status: Skilled Nursing Facility | Payer: Medicare Other | Source: Skilled Nursing Facility | Attending: Internal Medicine | Admitting: Internal Medicine

## 2018-09-05 DIAGNOSIS — K810 Acute cholecystitis: Secondary | ICD-10-CM | POA: Diagnosis not present

## 2018-09-05 DIAGNOSIS — Z20828 Contact with and (suspected) exposure to other viral communicable diseases: Secondary | ICD-10-CM | POA: Diagnosis present

## 2018-09-05 DIAGNOSIS — R140 Abdominal distension (gaseous): Secondary | ICD-10-CM

## 2018-09-05 DIAGNOSIS — E876 Hypokalemia: Secondary | ICD-10-CM | POA: Diagnosis not present

## 2018-09-05 DIAGNOSIS — K56609 Unspecified intestinal obstruction, unspecified as to partial versus complete obstruction: Secondary | ICD-10-CM

## 2018-09-05 DIAGNOSIS — N131 Hydronephrosis with ureteral stricture, not elsewhere classified: Secondary | ICD-10-CM | POA: Diagnosis present

## 2018-09-05 DIAGNOSIS — E119 Type 2 diabetes mellitus without complications: Secondary | ICD-10-CM | POA: Diagnosis present

## 2018-09-05 DIAGNOSIS — K9189 Other postprocedural complications and disorders of digestive system: Secondary | ICD-10-CM | POA: Diagnosis present

## 2018-09-05 DIAGNOSIS — E785 Hyperlipidemia, unspecified: Secondary | ICD-10-CM | POA: Diagnosis present

## 2018-09-05 DIAGNOSIS — Z7982 Long term (current) use of aspirin: Secondary | ICD-10-CM

## 2018-09-05 DIAGNOSIS — N138 Other obstructive and reflux uropathy: Secondary | ICD-10-CM | POA: Diagnosis present

## 2018-09-05 DIAGNOSIS — K567 Ileus, unspecified: Secondary | ICD-10-CM | POA: Diagnosis present

## 2018-09-05 DIAGNOSIS — Z7984 Long term (current) use of oral hypoglycemic drugs: Secondary | ICD-10-CM

## 2018-09-05 DIAGNOSIS — F79 Unspecified intellectual disabilities: Secondary | ICD-10-CM | POA: Diagnosis present

## 2018-09-05 DIAGNOSIS — E118 Type 2 diabetes mellitus with unspecified complications: Secondary | ICD-10-CM | POA: Diagnosis not present

## 2018-09-05 DIAGNOSIS — I10 Essential (primary) hypertension: Secondary | ICD-10-CM | POA: Diagnosis present

## 2018-09-05 DIAGNOSIS — R4701 Aphasia: Secondary | ICD-10-CM | POA: Diagnosis present

## 2018-09-05 DIAGNOSIS — R4189 Other symptoms and signs involving cognitive functions and awareness: Secondary | ICD-10-CM | POA: Diagnosis present

## 2018-09-05 DIAGNOSIS — N320 Bladder-neck obstruction: Secondary | ICD-10-CM | POA: Diagnosis present

## 2018-09-05 DIAGNOSIS — Z4659 Encounter for fitting and adjustment of other gastrointestinal appliance and device: Secondary | ICD-10-CM

## 2018-09-05 DIAGNOSIS — N401 Enlarged prostate with lower urinary tract symptoms: Secondary | ICD-10-CM | POA: Diagnosis present

## 2018-09-05 LAB — CBC WITH DIFFERENTIAL/PLATELET
Abs Immature Granulocytes: 0.03 10*3/uL (ref 0.00–0.07)
Basophils Absolute: 0 10*3/uL (ref 0.0–0.1)
Basophils Relative: 0 %
Eosinophils Absolute: 0.1 10*3/uL (ref 0.0–0.5)
Eosinophils Relative: 1 %
HCT: 37.9 % — ABNORMAL LOW (ref 39.0–52.0)
Hemoglobin: 11.9 g/dL — ABNORMAL LOW (ref 13.0–17.0)
Immature Granulocytes: 0 %
Lymphocytes Relative: 9 %
Lymphs Abs: 1.1 10*3/uL (ref 0.7–4.0)
MCH: 28.5 pg (ref 26.0–34.0)
MCHC: 31.4 g/dL (ref 30.0–36.0)
MCV: 90.7 fL (ref 80.0–100.0)
Monocytes Absolute: 0.7 10*3/uL (ref 0.1–1.0)
Monocytes Relative: 6 %
Neutro Abs: 9.7 10*3/uL — ABNORMAL HIGH (ref 1.7–7.7)
Neutrophils Relative %: 84 %
Platelets: 303 10*3/uL (ref 150–400)
RBC: 4.18 MIL/uL — ABNORMAL LOW (ref 4.22–5.81)
RDW: 12.7 % (ref 11.5–15.5)
WBC: 11.6 10*3/uL — ABNORMAL HIGH (ref 4.0–10.5)
nRBC: 0 % (ref 0.0–0.2)

## 2018-09-05 LAB — COMPREHENSIVE METABOLIC PANEL
ALT: 9 U/L (ref 0–44)
AST: 12 U/L — ABNORMAL LOW (ref 15–41)
Albumin: 4.1 g/dL (ref 3.5–5.0)
Alkaline Phosphatase: 52 U/L (ref 38–126)
Anion gap: 11 (ref 5–15)
BUN: 11 mg/dL (ref 8–23)
CO2: 26 mmol/L (ref 22–32)
Calcium: 9.3 mg/dL (ref 8.9–10.3)
Chloride: 101 mmol/L (ref 98–111)
Creatinine, Ser: 1.08 mg/dL (ref 0.61–1.24)
GFR calc Af Amer: >60 mL/min (ref >60)
GFR calc non Af Amer: >60 mL/min (ref >60)
Glucose, Bld: 152 mg/dL — ABNORMAL HIGH (ref 70–99)
Potassium: 3.5 mmol/L (ref 3.5–5.1)
Sodium: 138 mmol/L (ref 135–145)
Total Bilirubin: 0.5 mg/dL (ref 0.3–1.2)
Total Protein: 7.1 g/dL (ref 6.5–8.1)

## 2018-09-05 LAB — URINALYSIS, ROUTINE W REFLEX MICROSCOPIC
Bilirubin Urine: NEGATIVE
Glucose, UA: NEGATIVE mg/dL
Ketones, ur: NEGATIVE mg/dL
Nitrite: NEGATIVE
Protein, ur: 30 mg/dL — AB
Specific Gravity, Urine: 1.005 (ref 1.005–1.030)
WBC, UA: >50 WBC/hpf — ABNORMAL HIGH (ref 0–5)
pH: 7 (ref 5.0–8.0)

## 2018-09-05 LAB — GLUCOSE, CAPILLARY
Glucose-Capillary: 131 mg/dL — ABNORMAL HIGH (ref 70–99)
Glucose-Capillary: 104 mg/dL — ABNORMAL HIGH (ref 70–99)

## 2018-09-05 LAB — LIPASE, BLOOD: Lipase: 40 U/L (ref 11–51)

## 2018-09-05 MED ORDER — HEPARIN SODIUM (PORCINE) 5000 UNIT/ML IJ SOLN: 5000.0000 [IU] | Freq: Three times a day (TID) | INTRAMUSCULAR | Status: DC

## 2018-09-05 MED ORDER — ONDANSETRON HCL 4 MG PO TABS: 4.0000 mg | ORAL_TABLET | Freq: Four times a day (QID) | ORAL | Status: DC | PRN

## 2018-09-05 MED ORDER — ACETAMINOPHEN 650 MG RE SUPP: 650.0000 mg | Freq: Four times a day (QID) | RECTAL | Status: DC | PRN

## 2018-09-05 MED ORDER — LIDOCAINE HCL URETHRAL/MUCOSAL 2 % EX GEL
CUTANEOUS | Status: AC
  Administered 2018-09-05: 12:00:00
  Filled 2018-09-05: qty 10

## 2018-09-05 MED ORDER — ONDANSETRON HCL 4 MG/2ML IJ SOLN: 4.0000 mg | Freq: Four times a day (QID) | INTRAMUSCULAR | Status: DC | PRN

## 2018-09-05 MED ORDER — IOHEXOL 300 MG/ML  SOLN
100.0000 mL | Freq: Once | INTRAMUSCULAR | Status: AC | PRN
  Administered 2018-09-05: 100 mL via INTRAVENOUS

## 2018-09-05 MED ORDER — TAMSULOSIN HCL 0.4 MG PO CAPS
0.4000 mg | ORAL_CAPSULE | Freq: Every evening | ORAL | Status: DC
  Administered 2018-09-06 – 2018-09-08 (×3): 0.4 mg via ORAL
  Filled 2018-09-05 (×3): qty 1

## 2018-09-05 MED ORDER — INSULIN ASPART 100 UNIT/ML ~~LOC~~ SOLN
0.0000 [IU] | Freq: Three times a day (TID) | SUBCUTANEOUS | Status: DC
  Administered 2018-09-05: 1 [IU] via SUBCUTANEOUS
  Administered 2018-09-07: 2 [IU] via SUBCUTANEOUS
  Administered 2018-09-08 (×2): 1 [IU] via SUBCUTANEOUS
  Administered 2018-09-09: 3 [IU] via SUBCUTANEOUS
  Administered 2018-09-09: 1 [IU] via SUBCUTANEOUS

## 2018-09-05 MED ORDER — ENOXAPARIN SODIUM 40 MG/0.4ML ~~LOC~~ SOLN
40.0000 mg | SUBCUTANEOUS | Status: DC
  Administered 2018-09-05 – 2018-09-08 (×4): 40 mg via SUBCUTANEOUS
  Filled 2018-09-05 (×4): qty 0.4

## 2018-09-05 MED ORDER — MORPHINE SULFATE (PF) 2 MG/ML IV SOLN: 2.0000 mg | INTRAVENOUS | Status: DC | PRN

## 2018-09-05 MED ORDER — ACETAMINOPHEN 325 MG PO TABS: 650.0000 mg | ORAL_TABLET | Freq: Four times a day (QID) | ORAL | Status: DC | PRN

## 2018-09-05 MED ORDER — LACTATED RINGERS IV SOLN
INTRAVENOUS | Status: DC
  Administered 2018-09-05 – 2018-09-06 (×3): via INTRAVENOUS
  Administered 2018-09-07: 100 mL/h via INTRAVENOUS
  Administered 2018-09-08: 03:00:00 via INTRAVENOUS

## 2018-09-05 NOTE — ED Notes (Signed)
Unable to give report to RN or CN   RN will be available in 30 minutes

## 2018-09-05 NOTE — ED Notes (Signed)
Call to floor for

## 2018-09-05 NOTE — ED Notes (Signed)
Pt has sitter   Is quiet currently  Awaiting bed assignment

## 2018-09-05 NOTE — ED Notes (Signed)
Call for report   Report to Norwood Levo, RN

## 2018-09-05 NOTE — ED Provider Notes (Signed)
[facility] EMERGENCY DEPARTMENT Provider Note   CSN: [id number] Arrival date & time: 09/05/18  0350    History   Chief Complaint Chief Complaint  Patient presents with  . Bloated    HPI [name] is a 68 y.o. male.     The history is provided by the patient. No language interpreter was used.  Abdominal Pain  Pain location:  Generalized Pain quality: aching and bloating   Pain radiates to:  Does not radiate Pain severity:  Moderate Onset quality:  Gradual Duration:  1 day Timing:  Constant Progression:  Worsening Chronicity:  Recurrent Context: previous surgery   Context: not alcohol use   Relieved by:  Nothing Ineffective treatments:  None tried Risk factors: has not had multiple surgeries   Pt left the hospital yesterday.  Pt has had increased abdominal distention.    Past Medical History:  Diagnosis Date  . Diabetes mellitus without complication (HCC)   . History of gout   . History of kidney stones   . Hypertension   . Mentally challenged   . Urinary retention     Patient Active Problem List   Diagnosis Date Noted  . Volvulus (HCC) 09/03/2018  . Ileus (HCC)   . Pneumatosis of intestines   . Gall bladder disease 08/25/2018  . Small bowel ischemia (HCC)   . Acute gangrenous cholecystitis 05/24/2018  . SBO (small bowel obstruction) (HCC) 05/24/2018  . Abdominal distention   . Pressure injury of skin 04/09/2018  . C. difficile diarrhea 04/08/2018  . Bilateral hydronephrosis 04/08/2018  . Pneumoperitoneum of unknown etiology   . Diarrhea 04/07/2018  . Nonverbal 04/07/2018  . Hydronephrosis with urinary obstruction due to ureteral calculus 06/20/2016  . Sepsis secondary to UTI (HCC) 06/20/2016  . Fever 06/19/2016  . Generalized weakness 06/19/2016  . Diabetes mellitus type 2, controlled (HCC) 06/19/2016  . Severe sepsis with septic shock (HCC) 06/19/2016  . UTI (urinary tract infection) 06/19/2016  . Hematuria 06/19/2016  . AKI (acute kidney  injury) (HCC) 06/19/2016  . Hyponatremia 06/19/2016  . Hypokalemia 06/19/2016  . BPH (benign prostatic hyperplasia) 06/19/2016  . 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 Urinary retention 05/07/2014  . Intellectual disability 05/07/2014  . Hyperlipidemia 05/07/2014  . HTN (hypertension) 05/07/2014    Past Surgical History:  Procedure Laterality Date  . CYSTOSCOPY W/ URETERAL STENT PLACEMENT Bilateral 06/20/2016   Procedure: CYSTOSCOPY WITH RETROGRADE PYELOGRAM/URETERAL STENT PLACEMENT;  Surgeon: Malen Gauze, MD;  Location: [facility];  Service: Urology;  Laterality: Bilateral;  . CYSTOSCOPY WITH RETROGRADE PYELOGRAM, URETEROSCOPY AND STENT PLACEMENT Bilateral 08/20/2016   Procedure: CYSTOSCOPY WITH BILATERAL RETROGRADE PYELOGRAM, BILATERAL URETEROSCOPY,  LASER LITHOTRIPSY OF RIGHT URETERAL CALCULI, STONE BASKET EXTRACTION LEFT URETERAL CALCULI  AND BILATERAL URETERAL STENT EXCHANGE;  Surgeon: Malen Gauze, MD;  Location: [facility];  Service: Urology;  Laterality: Bilateral;  . HOLMIUM LASER APPLICATION Bilateral 08/20/2016   Procedure: HOLMIUM LASER APPLICATION;  Surgeon: Malen Gauze, MD;  Location: [facility];  Service: Urology;  Laterality: Bilateral;  . IR CATHETER TUBE CHANGE  07/04/2018  . IR EXCHANGE BILIARY DRAIN  06/09/2018  . IR PERC CHOLECYSTOSTOMY  05/24/2018  . LAPAROTOMY N/A 08/25/2018   Procedure: EXPLORATORY LAPAROTOMY reduction of volvulus;  Surgeon: [name], MD;  Location: [facility];  Service: General;  Laterality: N/A;        Home Medications    Prior to Admission medications   Medication Sig Start Date End Date Taking? Authorizing Provider  Acetaminophen (  TYLENOL ARTHRITIS PAIN PO) Take 1 tablet by mouth every 6 (six) hours as needed (pain).    [provider]  aspirin EC 81 MG tablet Take 81 mg by mouth every morning.     [provider]  feeding supplement, GLUCERNA SHAKE, (GLUCERNA SHAKE) LIQD Take 237 mLs by mouth 3 (three) times  daily between meals. 05/28/18   Ghimire, Werner LeanShanker M, MD  metFORMIN (GLUCOPHAGE) 500 MG tablet Take 1,000 mg by mouth every morning. 02/03/18   [provider]  phosphorus (K PHOS NEUTRAL) 155-852-130 MG tablet Take 1 tablet (250 mg total) by mouth daily. 09/04/18   Catarina Hartshornat, David, MD  simethicone (MYLICON) 40 MG/0.6ML drops Take 0.6 mLs (40 mg total) by mouth 4 (four) times daily. 09/04/18   Catarina Hartshornat, David, MD  tamsulosin (FLOMAX) 0.4 MG CAPS capsule Take 1 capsule (0.4 mg total) by mouth every evening. 04/12/18   Erick BlinksMemon, Jehanzeb, MD    Family History Family History  Problem Relation Age of Onset  . Gallbladder disease Other     Social History Social History   Tobacco Use  . Smoking status: Never Smoker  . Smokeless tobacco: Never Used  Substance Use Topics  . Alcohol use: No  . Drug use: No     Allergies   Patient has no known allergies.   Review of Systems Review of Systems  Gastrointestinal: Positive for abdominal pain.  All other systems reviewed and are negative.    Physical Exam Updated Vital Signs BP 117/79   Pulse 93   Temp 98.6 F (37 C) (Oral)   Resp 11   SpO2 96%   Physical Exam Vitals signs and nursing note reviewed.  Constitutional:      Appearance: He is well-developed.  HENT:     Head: Normocephalic and atraumatic.     Mouth/Throat:     Mouth: Mucous membranes are moist.  Eyes:     Conjunctiva/sclera: Conjunctivae normal.  Neck:     Musculoskeletal: Neck supple.  Cardiovascular:     Rate and Rhythm: Normal rate and regular rhythm.     Heart sounds: No murmur.  Pulmonary:     Effort: Pulmonary effort is normal. No respiratory distress.     Breath sounds: Normal breath sounds.  Abdominal:     General: There is distension.     Palpations: Abdomen is soft.     Tenderness: There is abdominal tenderness.     Comments: Abdomen tense, tight on exam.    Skin:    General: Skin is warm and dry.  Neurological:     Mental Status: He is alert.       ED Treatments / Results  Labs (all labs ordered are listed, but only abnormal results are displayed) Labs Reviewed  CBC WITH DIFFERENTIAL/PLATELET - Abnormal; Notable for the following components:      Result Value   WBC 11.6 (*)    RBC 4.18 (*)    Hemoglobin 11.9 (*)    HCT 37.9 (*)    Neutro Abs 9.7 (*)    All other components within normal limits  COMPREHENSIVE METABOLIC PANEL - Abnormal; Notable for the following components:   Glucose, Bld 152 (*)    AST 12 (*)    All other components within normal limits  LIPASE, BLOOD  URINALYSIS, ROUTINE W REFLEX MICROSCOPIC    EKG None  Radiology Dg Abd Acute 2+v W 1v Chest  Result Date: 09/05/2018 CLINICAL DATA:  Bloating EXAM: DG ABDOMEN ACUTE W/ 1V CHEST COMPARISON:  Four days ago FINDINGS: Marked generalized gaseous distension of small and large bowel. Rectal stool without over distension. Right upper quadrant drain is again noted. Multiple left renal calcifications. IMPRESSION: Continued generalized bowel distension suggesting adynamic ileus. Electronically Signed   By: Marnee Spring M.D.   On: 09/05/2018 10:05    Procedures Procedures (including critical care time)  Medications Ordered in ED Medications  iohexol (OMNIPAQUE) 300 MG/ML solution 100 mL (100 mLs Intravenous Contrast Given 09/05/18 1148)     Initial Impression / Assessment and Plan / ED Course  I have reviewed the triage vital signs and the nursing notes.  Pertinent labs & imaging results that were available during my care of the patient were reviewed by me and considered in my medical decision making (see chart for details).        NG tube placed 1 liter of dark fluid out,  Abdomen is more decompressed.  (Pt repomed Ng tube x 2 while draining.)  Pt placed in mitts  Radiologist reports ileus,  No obvious blockage,  This is still a concern due to large amount of output after ng tube.   Final Clinical Impressions(s) / ED Diagnoses   Final diagnoses:   Ileus [facility])    ED Discharge Orders    None    I spoke to hospitalist who will see here for admission     Osie Cheeks 09/05/18 1252    Loren Racer, MD 09/05/18 1351

## 2018-09-05 NOTE — ED Notes (Signed)
Introduced self to pt  Pt from ECF, non verbal   Abd is distended and taut  Awaiting CT

## 2018-09-05 NOTE — ED Notes (Signed)
Pt pulled out NG  NG re-placed to R nares with intermittent suction   AC informed and sitter requested

## 2018-09-05 NOTE — ED Notes (Signed)
Pt to CT

## 2018-09-05 NOTE — ED Notes (Signed)
CT called and requested that pt be next due to possible emergent condition

## 2018-09-05 NOTE — H&P (Addendum)
History and Physical    BARTEK FANJOY JXB:[id number] DOB: 11-07-1950 DOA: 09/05/2018  PCP: Pearson Grippe, MD   Patient coming from:  SNF  Chief Complaint: Abdominal distention.   HPI: [name] is a 68 y.o. male with medical history significant of small bowel obstruction due to volvulus status post reduction by exploratory laparotomy intervention Aug 26, 2018.  He also has diagnosis of type 2 diabetes mellitus, cognitive impairment, and a gangrenous cholecystitis status post percutaneous drain (February 2020).  He was discharged yesterday from the hospital after a prolonged hospital stay related to postoperative ileus. Early this morning his abdomen was significantly distended, associated with vomiting and abdominal pain, he was in distress and decision was made to transfer patient back to the hospital.   Patient is unable to give detailed history due to his acute illness and cognitive impairment, most of the information is obtained from equaling the skilled nursing facility and the signout from the emergency department staff.    ED Course: In the emergency department he was noted to be in distress, significant distention of his abdomen, a nasogastric tube was placed obtaining large amounts of liquid with improvement of patient's symptoms.  Due to persistent abdominal distention, despite nasogastric tube suction patient was referred for readmission to the hospital.  Review of Systems: Unable to obtain due to patient's unable to give detailed history.  Per nursing staff report apparently he slept last night with no major events, but this morning he was noted to have significant abdominal pain as mentioned in history present illness.  No apparent fevers, dyspnea or chest pain.  No diarrhea  Past Medical History:  Diagnosis Date   Diabetes mellitus without complication (HCC)    History of gout    History of kidney stones    Hypertension    Mentally challenged    Urinary retention       Past Surgical History:  Procedure Laterality Date   CYSTOSCOPY W/ URETERAL STENT PLACEMENT Bilateral 06/20/2016   Procedure: CYSTOSCOPY WITH RETROGRADE PYELOGRAM/URETERAL STENT PLACEMENT;  Surgeon: Malen Gauze, MD;  Location: [facility];  Service: Urology;  Laterality: Bilateral;   CYSTOSCOPY WITH RETROGRADE PYELOGRAM, URETEROSCOPY AND STENT PLACEMENT Bilateral 08/20/2016   Procedure: CYSTOSCOPY WITH BILATERAL RETROGRADE PYELOGRAM, BILATERAL URETEROSCOPY,  LASER LITHOTRIPSY OF RIGHT URETERAL CALCULI, STONE BASKET EXTRACTION LEFT URETERAL CALCULI  AND BILATERAL URETERAL STENT EXCHANGE;  Surgeon: Malen Gauze, MD;  Location: [facility];  Service: Urology;  Laterality: Bilateral;   HOLMIUM LASER APPLICATION Bilateral 08/20/2016   Procedure: HOLMIUM LASER APPLICATION;  Surgeon: Malen Gauze, MD;  Location: [facility];  Service: Urology;  Laterality: Bilateral;   IR CATHETER TUBE CHANGE  07/04/2018   IR EXCHANGE BILIARY DRAIN  06/09/2018   IR PERC CHOLECYSTOSTOMY  05/24/2018   LAPAROTOMY N/A 08/25/2018   Procedure: EXPLORATORY LAPAROTOMY reduction of volvulus;  Surgeon: [name], MD;  Location: [facility];  Service: General;  Laterality: N/A;     reports that he has never smoked. He has never used smokeless tobacco. He reports that he does not drink alcohol or use drugs.  No Known Allergies  Family History  Problem Relation Age of Onset   Gallbladder disease Other      Prior to Admission medications   Medication Sig Start Date End Date Taking? Authorizing Provider  Acetaminophen (TYLENOL ARTHRITIS PAIN PO) Take 1 tablet by mouth every 6 (six) hours as needed (pain).    [provider]  aspirin EC 81 MG tablet  Take 81 mg by mouth every morning.     [provider]  feeding supplement, GLUCERNA SHAKE, (GLUCERNA SHAKE) LIQD Take 237 mLs by mouth 3 (three) times daily between meals. 05/28/18   Ghimire, Werner Lean, MD  metFORMIN (GLUCOPHAGE) 500 MG tablet Take  1,000 mg by mouth every morning. 02/03/18   [provider]  phosphorus (K PHOS NEUTRAL) [id number] MG tablet Take 1 tablet (250 mg total) by mouth daily. 09/04/18   Catarina Hartshorn, MD  simethicone (MYLICON) 40 MG/0.6ML drops Take 0.6 mLs (40 mg total) by mouth 4 (four) times daily. 09/04/18   Catarina Hartshorn, MD  tamsulosin (FLOMAX) 0.4 MG CAPS capsule Take 1 capsule (0.4 mg total) by mouth every evening. 04/12/18   Erick Blinks, MD    Physical Exam: Vitals:   09/05/18 1130 09/05/18 1200 09/05/18 1215 09/05/18 1230  BP: 117/79 (!) 144/83  112/69  Pulse: 93  90 91  Resp:      Temp:      TempSrc:      SpO2: 96%  97% 95%    Vitals:   09/05/18 1130 09/05/18 1200 09/05/18 1215 09/05/18 1230  BP: 117/79 (!) 144/83  112/69  Pulse: 93  90 91  Resp:      Temp:      TempSrc:      SpO2: 96%  97% 95%   General: patient deconditioned and ill looking appearing  Neurology: he is somnolent and hyporeactive, has NG tube in place.  Head and Neck. Head normocephalic. Neck supple with no adenopathy or thyromegaly.   E ENT: mild pallor, no icterus, oral mucosa dry.  Cardiovascular: No JVD. S1-S2 present, rhythmic, no gallops, rubs, or murmurs. Trace lower extremity edema. Pulmonary: positive breath sounds bilaterally,  no wheezing, positive bilateral rhonchi and rales. Gastrointestinal. Abdomen very distended and tympanic to percussion, no evident pain to palpation. Surgical wound closed and clean with positive post op abdominal ecchymosis. Biliary drain in place at the right upper quadrant with yellow liquid drainage.  Skin. No rashes Musculoskeletal: no joint deformities    Labs on Admission: I have personally reviewed following labs and imaging studies  CBC: Recent Labs  Lab 09/05/18 1019  WBC 11.6*  NEUTROABS 9.7*  HGB 11.9*  HCT 37.9*  MCV 90.7  PLT 303   Basic Metabolic Panel: Recent Labs  Lab 08/31/18 0554 09/01/18 0617 09/02/18 0515 09/03/18 0452 09/04/18 0641  09/05/18 1019  NA 146* 146* 143 142 140 138  K 3.4* 3.2* 3.4* 3.1* 3.6 3.5  CL 110 110 108 110 105 101  CO2 GLUCOSE 121* 114* 114* 130* 144* 152*  BUN 28* CREATININE 1.26* 1.06 1.07 0.98 0.93 1.08  CALCIUM 8.4* 8.3* 8.1* 7.9* 8.6* 9.3  MG 1.9 1.8 1.8 2.0 1.9  --   PHOS 2.2* 2.7 2.5 2.2* 2.7  --    GFR: Estimated Creatinine Clearance: 61.2 mL/min (by C-G formula based on SCr of 1.08 mg/dL). Liver Function Tests: Recent Labs  Lab 09/02/18 0515 09/05/18 1019  AST 13* 12*  ALT 10 9  ALKPHOS 50 52  BILITOT 0.8 0.5  PROT 5.4* 7.1  ALBUMIN 2.4* 4.1   Recent Labs  Lab 09/05/18 1019  LIPASE 40   No results for input(s): AMMONIA in the last 168 hours. Coagulation Profile: No results for input(s): INR, PROTIME in the last 168 hours. Cardiac Enzymes: No results for input(s): CKTOTAL, CKMB, CKMBINDEX, TROPONINI in the  last 168 hours. BNP (last 3 results) No results for input(s): PROBNP in the last 8760 hours. HbA1C: No results for input(s): HGBA1C in the last 72 hours. CBG: Recent Labs  Lab 09/04/18 0005 09/04/18 0400 09/04/18 0730 09/04/18 1130 09/04/18 1619  GLUCAP 142* 129* 129* 166* 123*   Lipid Profile: No results for input(s): CHOL, HDL, LDLCALC, TRIG, CHOLHDL, LDLDIRECT in the last 72 hours. Thyroid Function Tests: No results for input(s): TSH, T4TOTAL, FREET4, T3FREE, THYROIDAB in the last 72 hours. Anemia Panel: No results for input(s): VITAMINB12, FOLATE, FERRITIN, TIBC, IRON, RETICCTPCT in the last 72 hours. Urine analysis:    Component Value Date/Time   COLORURINE YELLOW 09/05/2018 0937   APPEARANCEUR HAZY (A) 09/05/2018 0937   LABSPEC 1.005 09/05/2018 0937   PHURINE 7.0 09/05/2018 0937   GLUCOSEU NEGATIVE 09/05/2018 0937   HGBUR SMALL (A) 09/05/2018 0937   BILIRUBINUR NEGATIVE 09/05/2018 0937   KETONESUR NEGATIVE 09/05/2018 0937   PROTEINUR 30 (A) 09/05/2018 0937   UROBILINOGEN 1.0 07/20/2014 1718   NITRITE  NEGATIVE 09/05/2018 0937   LEUKOCYTESUR LARGE (A) 09/05/2018 0937    Radiological Exams on Admission: Ct Abdomen Pelvis W Contrast  Result Date: 09/05/2018 CLINICAL DATA:  68 year old male with history of biliary drain with extensive swelling and bruising in the abdomen. EXAM: CT ABDOMEN AND PELVIS WITH CONTRAST TECHNIQUE: Multidetector CT imaging of the abdomen and pelvis was performed using the standard protocol following bolus administration of intravenous contrast. CONTRAST:  OMNIPAQUE IOHEXOL 300 MG/ML  SOLN COMPARISON:  CT the abdomen and pelvis 08/25/2018. FINDINGS: Lower chest: Moderate right and small left pleural effusions lying dependently with some associated passive subsegmental atelectasis in the lower lobes of the lungs bilaterally. Distal esophagus is patulous with mural thickening and mucosal hyperenhancement. Hepatobiliary: Status post left hemihepatectomy. Percutaneous cholecystostomy tube in position within a completely decompressed gallbladder. No suspicious hepatic lesions. No intra or extrahepatic biliary ductal dilatation. Pancreas: No pancreatic mass. No pancreatic ductal dilatation. No pancreatic or peripancreatic fluid or inflammatory changes. Spleen: Unremarkable. Adrenals/Urinary Tract: There are multiple nonobstructive calculi within the left renal collecting system and renal pelvis measuring up to 12 mm in the interpolar region. No suspicious renal lesions. Moderate right and severe left hydroureteronephrosis. No definite ureteral calculi. Small nonobstructive calculi are also noted lying dependently in the urinary bladder measuring up to 5 mm in diameter. Urinary bladder is moderately distended, thick-walled and highly trabeculated. Several small bladder diverticulae are noted, largest of which is in the superior aspect of the urinary bladder measuring up to 4.8 cm in diameter. Small amount of gas within the urinary bladder non dependently. Stomach/Bowel: Stomach is  moderately distended with a large air-fluid level. No nasogastric tube is noted. Duodenum does not appear distended. Massive dilatation of multiple loops of small bowel measuring up to 8.0 cm in diameter with multiple air-fluid levels. No discrete transition point is confidently identified in the small bowel on today's examination. Colon is relatively decompressed, although there is gas and stool scattered throughout the colon and rectum. Normal appendix. Vascular/Lymphatic: Atherosclerotic calcifications in the pelvic vasculature. No aneurysm or dissection noted in the abdomen or pelvis. No lymphadenopathy noted in the abdomen or pelvis. Reproductive: Postoperative changes in the prostate gland suggesting prior TURP. Seminal vesicles are unremarkable in appearance. Other: Trace volume of ascites. No pneumoperitoneum. Musculoskeletal: Midline skin staples in the mid to lower abdomen. There are no aggressive appearing lytic or blastic lesions noted in the visualized portions of the skeleton. IMPRESSION: 1. Massive dilatation  of the small bowel, without definite transition point. Small volume of gas and stool in a relatively decompressed colon. Findings are favored to reflect small bowel ileus, which has been evident on numerous prior abdominal studies. The possibility of partial or early small-bowel obstruction is not entirely excluded, but not strongly favored at this time. 2. Small amount of gas non dependently in the urinary bladder, favored to be iatrogenic. In the absence of recent catheterization, correlation with urinalysis would be strongly recommended to exclude the possibility of urinary tract infection with gas-forming organisms. 3. Multiple nonobstructive calculi within the left renal collecting system and lying dependently in the lumen of the urinary bladder. No ureteral stones are noted at this time. Despite this, there is moderate right and severe left hydroureteronephrosis, which could be indicative  of distal ureteral strictures, and is presumably exacerbated in the setting of a markedly distended urinary bladder. Urologic consultation is recommended. 4. Moderate right and small left pleural effusions lying dependently with some associated passive subsegmental atelectasis throughout the lower lobes of the lungs bilaterally. 5. Patulous fluid-filled esophagus which demonstrates mural thickening and hyper enhancement, which may simply reflect esophagitis, however, there is any clinical concern for Barrett's metaplasia or esophageal neoplasia, further evaluation with endoscopy could be considered. 6. Additional incidental findings, as above. Electronically Signed   By: Trudie Reedaniel  Entrikin M.D.   On: 09/05/2018 12:20   Dg Abd Acute 2+v W 1v Chest  Result Date: 09/05/2018 CLINICAL DATA:  Bloating EXAM: DG ABDOMEN ACUTE W/ 1V CHEST COMPARISON:  Four days ago FINDINGS: Marked generalized gaseous distension of small and large bowel. Rectal stool without over distension. Right upper quadrant drain is again noted. Multiple left renal calcifications. IMPRESSION: Continued generalized bowel distension suggesting adynamic ileus. Electronically Signed   By: Marnee SpringJonathon  Watts M.D.   On: 09/05/2018 10:05    EKG: Independently reviewed. NA  Assessment/Plan Principal Problem:   Ileus (HCC) Active Problems:   HTN (hypertension)   Diabetes mellitus type 2, controlled (HCC)   Nonverbal   SBO (small bowel obstruction) (HCC)   Gangrenous cholecystitis  68 year old male who was recently hospitalized for small bowel obstruction related to volvulus, status post reduction by exploratory laparotomy.  He had postoperative ileus that required nasogastric tube suctioning.  He was discharged to skilled nursing facility yesterday, this morning had severe recurrence of his symptoms, consistent with abdominal distention, abdominal pain and vomiting, he required reinsertion of the nasogastric tube.  On his initial physical  examination his ill looking appearing, he has a nasogastric tube in place and mittens have been placed in his hands, to prevent him removing the NG tube.  His blood pressure is 125/75, heart rate 92, respiratory rate 11, oxygen saturation 98%.  He is pale, with dry mucous membranes, his lungs had rhonchi and rales bilaterally, heart S1-S2 present and rhythmic, his abdomen was significantly distended, surgical wound was clean, biliary drain in place.  Trace lower extremity edema.  Sodium 138, potassium 3.5, chloride 101, bicarb 26, glucose 152, BUN 11, creatinine 1.0, white count 11.6, hemoglobin 11.9, hematocrit 37.9, platelets 303.  Urinalysis with more than 50 white cells.  Chest radiograph with low lung volumes and right base atelectasis, nasogastric tube tip below the diaphragm.  Abdominal films with diffuse distention of the small and large bowel.  Patient was admitted to the hospital working diagnosis of recurrence of postoperative small bowel and colonic ileus.   1.  Severe postoperative ileus, small bowel and colonic involvement.  Patient will be  admitted to the medical ward, will keep patient nothing by mouth and continue nasogastric tube to low intermittent suction.  Supportive medical therapy with intravenous fluids, aspiration precautions, and oximetry monitoring.  Will consult surgery for further recommendations.  Continue as needed IV analgesics and antiemetics.  2.  Type 2 diabetes mellitus.  At home patient on metformin, will hold oral agents, continue patient nothing by mouth, add insulin sliding scale for glucose coverage and monitoring.  3.  BPH. His urinalysis has more than 50 white cells, but no other signs of urinary tract infection, hold antibiotic therapy for now.  4.  Cognitive impairment.  Patient is nonverbal, continue neurochecks per unit protocol.  DVT prophylaxis:  Enoxaparin  Code Status: full  Family Communication: no family at the bedside   Disposition Plan: med-surg    Consults called: surgery   Admission status: Inpatient.     Hue Steveson Annett Gula MD [facility]   09/05/2018, 12:55 PM

## 2018-09-05 NOTE — Progress Notes (Signed)
Pt is not in restraints. Pt has a bedside sitter & is wearing mittens (not considered a restraint). He follows commands at this time.  Will continue to monitor patient throughout night incase the need for restraints arises.

## 2018-09-05 NOTE — ED Triage Notes (Signed)
Pt has a biliary drain. Had an NG tube in place, which patient pulled out. Was admitted to [facility] last night. There is now extensive swelling to the abdomen as well as bruising.

## 2018-09-05 NOTE — ED Notes (Signed)
Call to floor for report  Unable to connect with receiving RN

## 2018-09-05 NOTE — ED Notes (Signed)
#  16 NG passed to L nares with lidocaine and Buford Dresser, RN assist  Return of 500 cc immediately of dark gastric contents Pt attempted to pull out NG   Request for sitter from [facility] (Jorja Loa, Charity fundraiser)  Mittens on hands for NG safety

## 2018-09-06 ENCOUNTER — Inpatient Hospital Stay (HOSPITAL_COMMUNITY): Payer: Medicare Other

## 2018-09-06 DIAGNOSIS — E118 Type 2 diabetes mellitus with unspecified complications: Secondary | ICD-10-CM | POA: Diagnosis not present

## 2018-09-06 DIAGNOSIS — K56609 Unspecified intestinal obstruction, unspecified as to partial versus complete obstruction: Secondary | ICD-10-CM | POA: Diagnosis not present

## 2018-09-06 DIAGNOSIS — K567 Ileus, unspecified: Secondary | ICD-10-CM | POA: Diagnosis not present

## 2018-09-06 DIAGNOSIS — K810 Acute cholecystitis: Secondary | ICD-10-CM | POA: Diagnosis not present

## 2018-09-06 LAB — URINALYSIS, ROUTINE W REFLEX MICROSCOPIC
Bilirubin Urine: NEGATIVE
Glucose, UA: NEGATIVE mg/dL
Ketones, ur: NEGATIVE mg/dL
Nitrite: NEGATIVE
Protein, ur: 100 mg/dL — AB
Specific Gravity, Urine: 1.019 (ref 1.005–1.030)
WBC, UA: >50 WBC/hpf — ABNORMAL HIGH (ref 0–5)
pH: 6 (ref 5.0–8.0)

## 2018-09-06 LAB — BASIC METABOLIC PANEL
Anion gap: 11 (ref 5–15)
BUN: 13 mg/dL (ref 8–23)
CO2: 28 mmol/L (ref 22–32)
Calcium: 8.6 mg/dL — ABNORMAL LOW (ref 8.9–10.3)
Chloride: 102 mmol/L (ref 98–111)
Creatinine, Ser: 1.43 mg/dL — ABNORMAL HIGH (ref 0.61–1.24)
GFR calc Af Amer: 58 mL/min — ABNORMAL LOW (ref >60)
GFR calc non Af Amer: 50 mL/min — ABNORMAL LOW (ref >60)
Glucose, Bld: 117 mg/dL — ABNORMAL HIGH (ref 70–99)
Potassium: 3.4 mmol/L — ABNORMAL LOW (ref 3.5–5.1)
Sodium: 141 mmol/L (ref 135–145)

## 2018-09-06 LAB — GLUCOSE, CAPILLARY
Glucose-Capillary: 98 mg/dL (ref 70–99)
Glucose-Capillary: 106 mg/dL — ABNORMAL HIGH (ref 70–99)
Glucose-Capillary: 114 mg/dL — ABNORMAL HIGH (ref 70–99)
Glucose-Capillary: 118 mg/dL — ABNORMAL HIGH (ref 70–99)

## 2018-09-06 LAB — CBC
HCT: 32.9 % — ABNORMAL LOW (ref 39.0–52.0)
Hemoglobin: 10.6 g/dL — ABNORMAL LOW (ref 13.0–17.0)
MCH: 29.4 pg (ref 26.0–34.0)
MCHC: 32.2 g/dL (ref 30.0–36.0)
MCV: 91.1 fL (ref 80.0–100.0)
Platelets: 286 10*3/uL (ref 150–400)
RBC: 3.61 MIL/uL — ABNORMAL LOW (ref 4.22–5.81)
RDW: 13 % (ref 11.5–15.5)
WBC: 10.6 10*3/uL — ABNORMAL HIGH (ref 4.0–10.5)
nRBC: 0 % (ref 0.0–0.2)

## 2018-09-06 LAB — SARS CORONAVIRUS 2 BY RT PCR (HOSPITAL ORDER, PERFORMED IN ~~LOC~~ HOSPITAL LAB): SARS Coronavirus 2: NEGATIVE

## 2018-09-06 MED ORDER — POTASSIUM CHLORIDE 10 MEQ/100ML IV SOLN
10.0000 meq | INTRAVENOUS | Status: AC
  Administered 2018-09-06 (×4): 10 meq via INTRAVENOUS
  Filled 2018-09-06 (×4): qty 100

## 2018-09-06 MED ORDER — ACETAMINOPHEN 325 MG PO TABS: 650.0000 mg | ORAL_TABLET | Freq: Four times a day (QID) | ORAL | Status: DC | PRN

## 2018-09-06 MED ORDER — LORAZEPAM 2 MG/ML IJ SOLN
1.0000 mg | Freq: Once | INTRAMUSCULAR | Status: AC
  Administered 2018-09-06: 02:00:00 1 mg via INTRAVENOUS
  Filled 2018-09-06: qty 1

## 2018-09-06 MED ORDER — ACETAMINOPHEN 650 MG RE SUPP: 650.0000 mg | Freq: Four times a day (QID) | RECTAL | Status: DC | PRN

## 2018-09-06 NOTE — Progress Notes (Addendum)
Bladder scan revealed greater than . 14Fr Catheter placed per Dr. Lovell Sheehan verbal order patient tolerated well. On insertion there is greater than 1200cc of cloudy yellow urine returned. MD made aware and foley is documented.

## 2018-09-06 NOTE — Progress Notes (Addendum)
PROGRESS NOTE    [name]  XBJ:[id number] DOB: 09/08/1950 DOA: 09/05/2018 PCP: [name], [name], MD    Brief Narrative:  68 year old male who was recently hospitalized for small bowel obstruction related to volvulus, status post reduction by exploratory laparotomy.  He had postoperative ileus that required nasogastric tube suctioning.  He was discharged to skilled nursing facility yesterday, this morning had severe recurrence of his symptoms, consistent with abdominal distention, abdominal pain and vomiting, he required reinsertion of the nasogastric tube.  On his initial physical examination his ill looking appearing, he has a nasogastric tube in place and mittens have been placed in his hands, to prevent him removing the NG tube.  His blood pressure is 125/75, heart rate 92, respiratory rate 11, oxygen saturation 98%.  He is pale, with dry mucous membranes, his lungs had rhonchi and rales bilaterally, heart S1-S2 present and rhythmic, his abdomen was significantly distended, surgical wound was clean, biliary drain in place.  Trace lower extremity edema.  Sodium 138, potassium 3.5, chloride 101, bicarb 26, glucose 152, BUN 11, creatinine 1.0, white count 11.6, hemoglobin 11.9, hematocrit 37.9, platelets 303.  Urinalysis with more than 50 white cells.  Chest radiograph with low lung volumes and right base atelectasis, nasogastric tube tip below the diaphragm.  Abdominal films with diffuse distention of the small and large bowel.  Patient was admitted to the hospital working diagnosis of recurrence of postoperative small bowel and colonic ileus.     Assessment & Plan:   Principal Problem:   Ileus (HCC) Active Problems:   HTN (hypertension)   Diabetes mellitus type 2, controlled (HCC)   Nonverbal   SBO (small bowel obstruction) (HCC)   Gangrenous cholecystitis   1.  Severe postoperative ileus, small bowel and colonic involvement. Abdominal distention has improved, confirmed with abdominal  radiograph, not completely resolved, will continue NG tube to low intermittent suction, no vomiting. Continue to keep patient nothing by mouth and supportive IV fluids, as needed analgesics and antiemetics.   2.  Type 2 diabetes mellitus.  Continue glucose cover with insulin sliding scale, fasting glucose is 117. Patient continue to be NPO.   3.  BPH, complicated with bladder outlet obstruction and hydronephrosis and AKI.  Significant urinary retention per bladder scan, foley catheter has been placed. Will continue hydration with balanced electrolyte solutions and follow on renal panel in am.   4. Hypokalemia. K is down to 3,4 possible further drop after post obstruction diuresis, will add 40 meq Kcl today IV. Check Mg in am.   5. Cognitive impairment.  More awake and alert today, continue to be non verbal, pulled his NG tube last night. Will continue neuro checks and aspiration precautions   DVT prophylaxis: enoxaparin   Code Status: full Family Communication: no family at the bedside  Disposition Plan/ discharge barriers: pending clinical improvement   There is no height or weight on file to calculate BMI. Malnutrition Type:      Malnutrition Characteristics:      Nutrition Interventions:     RN Pressure Injury Documentation:    Consultants:   Surgery   Procedures:     Antimicrobials:       Subjective: Patient continue to be non verbal, pulled NG tube last night and had to be re-inserted, foley catheter has been placed this am for urinary retention.   Objective: Vitals:   09/05/18 1330 09/05/18 1400 09/05/18 2116 09/06/18 0606  BP: 117/64 119/79 119/62 133/61  Pulse: 78 78 84 (!) 102  Resp:   17 17  Temp:   (!) 97.3 F (36.3 C) 98.3 F (36.8 C)  TempSrc:   Oral Oral  SpO2: 95% 93% 96% 98%    Intake/Output Summary (Last 24 hours) at 09/06/2018 1106 Last data filed at 09/06/2018 0340 Gross per 24 hour  Intake 1172.02 ml  Output 750 ml  Net 422.02  ml   There were no vitals filed for this visit.  Examination:   General: deconditioned  Neurology: Awake, non verbal and not interactive. Not in pain.  E ENT: no pallor, no icterus, oral mucosa moist, NG tube in place.  Cardiovascular: No JVD. S1-S2 present, rhythmic, no gallops, rubs, or murmurs. No lower extremity edema. Pulmonary: positive breath sounds bilaterally, adequate air movement, no wheezing, rhonchi or rales. Gastrointestinal. Abdomen positive distention, tympanic to percussion, no organomegaly, non tender, no rebound or guarding Skin. No rashes Musculoskeletal: no joint deformities     Data Reviewed: I have personally reviewed following labs and imaging studies  CBC: Recent Labs  Lab 09/05/18 1019 09/06/18 0638  WBC 11.6* 10.6*  NEUTROABS 9.7*  --   HGB 11.9* 10.6*  HCT 37.9* 32.9*  MCV 90.7 91.1  PLT 303 286   Basic Metabolic Panel: Recent Labs  Lab 08/31/18 0554 09/01/18 0617 09/02/18 0515 09/03/18 0452 09/04/18 0641 09/05/18 1019 09/06/18 0638  NA 146* 146* 143 142 140 138 141  K 3.4* 3.2* 3.4* 3.1* 3.6 3.5 3.4*  CL 110 110 108 110 105 101 102  CO2 26 27 24 29 27 26 28   GLUCOSE 121* 114* 114* 130* 144* 152* 117*  BUN 28* 21 23 14 8 11 13   CREATININE 1.26* 1.06 1.07 0.98 0.93 1.08 1.43*  CALCIUM 8.4* 8.3* 8.1* 7.9* 8.6* 9.3 8.6*  MG 1.9 1.8 1.8 2.0 1.9  --   --   PHOS 2.2* 2.7 2.5 2.2* 2.7  --   --    GFR: Estimated Creatinine Clearance: 46.2 mL/min (A) (by C-G formula based on SCr of 1.43 mg/dL (H)). Liver Function Tests: Recent Labs  Lab 09/02/18 0515 09/05/18 1019  AST 13* 12*  ALT 10 9  ALKPHOS 50 52  BILITOT 0.8 0.5  PROT 5.4* 7.1  ALBUMIN 2.4* 4.1   Recent Labs  Lab 09/05/18 1019  LIPASE 40   No results for input(s): AMMONIA in the last 168 hours. Coagulation Profile: No results for input(s): INR, PROTIME in the last 168 hours. Cardiac Enzymes: No results for input(s): CKTOTAL, CKMB, CKMBINDEX, TROPONINI in the last 168  hours. BNP (last 3 results) No results for input(s): PROBNP in the last 8760 hours. HbA1C: No results for input(s): HGBA1C in the last 72 hours. CBG: Recent Labs  Lab 09/04/18 1130 09/04/18 1619 09/05/18 1644 09/05/18 2114 09/06/18 0730  GLUCAP 166* 123* 131* 104* 98   Lipid Profile: No results for input(s): CHOL, HDL, LDLCALC, TRIG, CHOLHDL, LDLDIRECT in the last 72 hours. Thyroid Function Tests: No results for input(s): TSH, T4TOTAL, FREET4, T3FREE, THYROIDAB in the last 72 hours. Anemia Panel: No results for input(s): VITAMINB12, FOLATE, FERRITIN, TIBC, IRON, RETICCTPCT in the last 72 hours.    Radiology Studies: I have reviewed all of the imaging during this hospital visit personally     Scheduled Meds: . enoxaparin (LOVENOX) injection  40 mg Subcutaneous Q24H  . insulin aspart  0-9 Units Subcutaneous TID WC  . tamsulosin  0.4 mg Oral QPM   Continuous Infusions: . lactated ringers 100 mL/hr at 09/06/18 [phone]  LOS: 1 day        Shaniquia Brafford Gerome Apley, MD

## 2018-09-06 NOTE — Progress Notes (Signed)
Late Entry: Pt pulled out NG tube with siiter at bedside and mitts on. NG tube reinserted & MD was notified of agitation. MD ordered Ativan. CXR ordered for placement of tube. Will continue to monitor patient.

## 2018-09-06 NOTE — Progress Notes (Signed)
Xcover Per RN, pt pulled on NGT, now replaced CXR pending, pt was anxious and would not let CXR be done.  wll try ativan 1mg  iv x1 to see if can get pt more calm so CXR can be done for placement.

## 2018-09-06 NOTE — Progress Notes (Signed)
Subjective: Readmission and events of last night noted.  Unable to obtain history from patient due to mental status.  Objective: Vital signs in last 24 hours: Temp:  [97.3 F (36.3 C)-98.3 F (36.8 C)] 98.3 F (36.8 C) (05/23 0606) Pulse Rate:  [78-102] 102 (05/23 0606) Resp:  [11-17] 17 (05/23 0606) BP: (112-144)/(61-86) 133/61 (05/23 0606) SpO2:  [88 %-98 %] 98 % (05/23 0606) Last BM Date: 09/06/18  Intake/Output from previous day: 05/22 0701 - 05/23 0700 In: 1172 [I.V.:1172] Out: 750 [Emesis/NG output:500; Drains:250] Intake/Output this shift: No intake/output data recorded.  General appearance: no distress GI: Distended and tense.  Midline incision intact with ecchymosis present.  No purulent drainage present.  Lab Results:  Recent Labs    09/05/18 1019 09/06/18 0638  WBC 11.6* 10.6*  HGB 11.9* 10.6*  HCT 37.9* 32.9*  PLT 303 286   BMET Recent Labs    09/05/18 1019 09/06/18 0638  NA 138 141  K 3.5 3.4*  CL 101 102  CO2 26 28  GLUCOSE 152* 117*  BUN 11 13  CREATININE 1.08 1.43*  CALCIUM 9.3 8.6*   PT/INR No results for input(s): LABPROT, INR in the last 72 hours.  Studies/Results: Dg Chest 1 View  Result Date: 09/06/2018 CLINICAL DATA:  68 y/o  M; NG tube placement. EXAM: CHEST  1 VIEW COMPARISON:  08/16/2018 chest radiograph FINDINGS: NG tube is coiling in the gastric body with tip projecting over proximal stomach unchanged in position. Stable cardiac silhouette given projection and technique. Low lung volumes and increased pulmonary markings. Hazy opacification of the right lung may represent a layering effusion. Cholecystostomy tube projects over right upper quadrant. Bones are unremarkable. IMPRESSION: NG tube is coiling in the gastric body with tip projecting over proximal stomach unchanged in position. Stable low lung volumes and increased pulmonary markings. Hazy opacification of the right lung may represent a layering effusion. Electronically  Signed   By: Mitzi HansenLance  Furusawa-Stratton M.D.   On: 09/06/2018 02:51   Dg Abd 1 View  Result Date: 09/06/2018 CLINICAL DATA:  Abdominal distension EXAM: ABDOMEN - 1 VIEW COMPARISON:  Sep 05, 2018 FINDINGS: Nasogastric tube tip and side port in stomach. There is a presumed Frost amic catheter on the right. There is moderate generalized bowel dilatation, less pronounced than on 1 day prior. No air-fluid levels or free air evident. Urinary bladder appears distended with contrast. There are skin staples overlying the lower abdomen and pelvis just to the right of midline. IMPRESSION: Suspect a degree of bowel ileus. There is less bowel dilatation compared to 1 day prior. Nasogastric tube tip and side port in stomach. Presumed nephrostomy catheter on the right. Urinary bladder distended. Electronically Signed   By: Bretta BangWilliam  Woodruff III M.D.   On: 09/06/2018 09:53   Ct Abdomen Pelvis W Contrast  Result Date: 09/05/2018 CLINICAL DATA:  68 year old male with history of biliary drain with extensive swelling and bruising in the abdomen. EXAM: CT ABDOMEN AND PELVIS WITH CONTRAST TECHNIQUE: Multidetector CT imaging of the abdomen and pelvis was performed using the standard protocol following bolus administration of intravenous contrast. CONTRAST:  100mL OMNIPAQUE IOHEXOL 300 MG/ML  SOLN COMPARISON:  CT the abdomen and pelvis 08/25/2018. FINDINGS: Lower chest: Moderate right and small left pleural effusions lying dependently with some associated passive subsegmental atelectasis in the lower lobes of the lungs bilaterally. Distal esophagus is patulous with mural thickening and mucosal hyperenhancement. Hepatobiliary: Status post left hemihepatectomy. Percutaneous cholecystostomy tube in position within a completely decompressed  gallbladder. No suspicious hepatic lesions. No intra or extrahepatic biliary ductal dilatation. Pancreas: No pancreatic mass. No pancreatic ductal dilatation. No pancreatic or peripancreatic fluid or  inflammatory changes. Spleen: Unremarkable. Adrenals/Urinary Tract: There are multiple nonobstructive calculi within the left renal collecting system and renal pelvis measuring up to 12 mm in the interpolar region. No suspicious renal lesions. Moderate right and severe left hydroureteronephrosis. No definite ureteral calculi. Small nonobstructive calculi are also noted lying dependently in the urinary bladder measuring up to 5 mm in diameter. Urinary bladder is moderately distended, thick-walled and highly trabeculated. Several small bladder diverticulae are noted, largest of which is in the superior aspect of the urinary bladder measuring up to 4.8 cm in diameter. Small amount of gas within the urinary bladder non dependently. Stomach/Bowel: Stomach is moderately distended with a large air-fluid level. No nasogastric tube is noted. Duodenum does not appear distended. Massive dilatation of multiple loops of small bowel measuring up to 8.0 cm in diameter with multiple air-fluid levels. No discrete transition point is confidently identified in the small bowel on today's examination. Colon is relatively decompressed, although there is gas and stool scattered throughout the colon and rectum. Normal appendix. Vascular/Lymphatic: Atherosclerotic calcifications in the pelvic vasculature. No aneurysm or dissection noted in the abdomen or pelvis. No lymphadenopathy noted in the abdomen or pelvis. Reproductive: Postoperative changes in the prostate gland suggesting prior TURP. Seminal vesicles are unremarkable in appearance. Other: Trace volume of ascites. No pneumoperitoneum. Musculoskeletal: Midline skin staples in the mid to lower abdomen. There are no aggressive appearing lytic or blastic lesions noted in the visualized portions of the skeleton. IMPRESSION: 1. Massive dilatation of the small bowel, without definite transition point. Small volume of gas and stool in a relatively decompressed colon. Findings are favored to  reflect small bowel ileus, which has been evident on numerous prior abdominal studies. The possibility of partial or early small-bowel obstruction is not entirely excluded, but not strongly favored at this time. 2. Small amount of gas non dependently in the urinary bladder, favored to be iatrogenic. In the absence of recent catheterization, correlation with urinalysis would be strongly recommended to exclude the possibility of urinary tract infection with gas-forming organisms. 3. Multiple nonobstructive calculi within the left renal collecting system and lying dependently in the lumen of the urinary bladder. No ureteral stones are noted at this time. Despite this, there is moderate right and severe left hydroureteronephrosis, which could be indicative of distal ureteral strictures, and is presumably exacerbated in the setting of a markedly distended urinary bladder. Urologic consultation is recommended. 4. Moderate right and small left pleural effusions lying dependently with some associated passive subsegmental atelectasis throughout the lower lobes of the lungs bilaterally. 5. Patulous fluid-filled esophagus which demonstrates mural thickening and hyper enhancement, which may simply reflect esophagitis, however, there is any clinical concern for Barrett's metaplasia or esophageal neoplasia, further evaluation with endoscopy could be considered. 6. Additional incidental findings, as above. Electronically Signed   By: Trudie Reed M.D.   On: 09/05/2018 12:20   Dg Chest Portable 1 View  Result Date: 09/05/2018 CLINICAL DATA:  Status post nasogastric tube placement. EXAM: PORTABLE CHEST 1 VIEW COMPARISON:  Radiographs of same day. FINDINGS: Stable cardiomediastinal silhouette. Hypoinflation of the lungs is noted with mild bibasilar subsegmental atelectasis. No pneumothorax is noted. Distal tip of nasogastric tube is seen in proximal stomach. Bony thorax is unremarkable. IMPRESSION: Hypoinflation of the lungs  is noted with mild bibasilar subsegmental atelectasis. Distal tip of nasogastric  tube is seen in proximal stomach. Electronically Signed   By: Lupita Raider M.D.   On: 09/05/2018 13:23   Dg Chest Port 1v Same Day  Result Date: 09/06/2018 CLINICAL DATA:  Nasogastric tube placement. EXAM: PORTABLE CHEST 1 VIEW COMPARISON:  Sep 06, 2018 study obtained earlier in the day FINDINGS: Nasogastric tube tip and side port are in the stomach, essentially unchanged from earlier in the day. There is a layering pleural effusion on the right. There is no edema or consolidation. There is mild cardiomegaly with pulmonary venous hypertension. No adenopathy. No bone lesions. IMPRESSION: Nasogastric tube tip and side port in the stomach with the tip directed superiorly in the proximal body of the stomach, unchanged from earlier in the day. They are in pleural effusion on the right. Pulmonary vascular congestion present without edema or consolidation. Electronically Signed   By: Bretta Bang III M.D.   On: 09/06/2018 07:58   Dg Abd Acute 2+v W 1v Chest  Result Date: 09/05/2018 CLINICAL DATA:  Bloating EXAM: DG ABDOMEN ACUTE W/ 1V CHEST COMPARISON:  Four days ago FINDINGS: Marked generalized gaseous distension of small and large bowel. Rectal stool without over distension. Right upper quadrant drain is again noted. Multiple left renal calcifications. IMPRESSION: Continued generalized bowel distension suggesting adynamic ileus. Electronically Signed   By: Marnee Spring M.D.   On: 09/05/2018 10:05    Anti-infectives: Anti-infectives (From admission, onward)   None      Assessment/Plan: Impression: Postoperative ileus.  No evidence of intra-abdominal abscess or mechanical obstruction at this point.  Urinary retention is present.  The anterior abdominal wall is significantly attenuated, but the fascia seems to be intact as there is no drainage from the wound. Plan: Continue NG tube decompression.  Will get bladder  scan.  May need Foley placement.  Will follow with you.  LOS: 1 day    Franky Macho 09/06/2018

## 2018-09-06 NOTE — Progress Notes (Addendum)
Patient is not in restraints, patient has a sitter at bedside and is wearing mittens so he does not pull out NG tube. Will continue to monitor patient for need of restraints and sitter.

## 2018-09-07 DIAGNOSIS — N320 Bladder-neck obstruction: Secondary | ICD-10-CM

## 2018-09-07 DIAGNOSIS — N179 Acute kidney failure, unspecified: Secondary | ICD-10-CM

## 2018-09-07 DIAGNOSIS — E118 Type 2 diabetes mellitus with unspecified complications: Secondary | ICD-10-CM | POA: Diagnosis not present

## 2018-09-07 DIAGNOSIS — E876 Hypokalemia: Secondary | ICD-10-CM

## 2018-09-07 DIAGNOSIS — K56609 Unspecified intestinal obstruction, unspecified as to partial versus complete obstruction: Secondary | ICD-10-CM | POA: Diagnosis not present

## 2018-09-07 DIAGNOSIS — I10 Essential (primary) hypertension: Secondary | ICD-10-CM | POA: Diagnosis not present

## 2018-09-07 DIAGNOSIS — K567 Ileus, unspecified: Secondary | ICD-10-CM | POA: Diagnosis not present

## 2018-09-07 LAB — GLUCOSE, CAPILLARY
Glucose-Capillary: 95 mg/dL (ref 70–99)
Glucose-Capillary: 99 mg/dL (ref 70–99)
Glucose-Capillary: 154 mg/dL — ABNORMAL HIGH (ref 70–99)
Glucose-Capillary: 171 mg/dL — ABNORMAL HIGH (ref 70–99)

## 2018-09-07 LAB — MAGNESIUM: Magnesium: 1.7 mg/dL (ref 1.7–2.4)

## 2018-09-07 LAB — BASIC METABOLIC PANEL
Anion gap: 12 (ref 5–15)
BUN: 17 mg/dL (ref 8–23)
CO2: 30 mmol/L (ref 22–32)
Calcium: 8.5 mg/dL — ABNORMAL LOW (ref 8.9–10.3)
Chloride: 103 mmol/L (ref 98–111)
Creatinine, Ser: 1.27 mg/dL — ABNORMAL HIGH (ref 0.61–1.24)
GFR calc Af Amer: >60 mL/min (ref >60)
GFR calc non Af Amer: 58 mL/min — ABNORMAL LOW (ref >60)
Glucose, Bld: 114 mg/dL — ABNORMAL HIGH (ref 70–99)
Potassium: 3.7 mmol/L (ref 3.5–5.1)
Sodium: 145 mmol/L (ref 135–145)

## 2018-09-07 LAB — CBC WITH DIFFERENTIAL/PLATELET
Abs Immature Granulocytes: 0.04 10*3/uL (ref 0.00–0.07)
Basophils Absolute: 0 10*3/uL (ref 0.0–0.1)
Basophils Relative: 0 %
Eosinophils Absolute: 0 10*3/uL (ref 0.0–0.5)
Eosinophils Relative: 0 %
HCT: 29.1 % — ABNORMAL LOW (ref 39.0–52.0)
Hemoglobin: 9.1 g/dL — ABNORMAL LOW (ref 13.0–17.0)
Immature Granulocytes: 0 %
Lymphocytes Relative: 8 %
Lymphs Abs: 0.9 10*3/uL (ref 0.7–4.0)
MCH: 28.6 pg (ref 26.0–34.0)
MCHC: 31.3 g/dL (ref 30.0–36.0)
MCV: 91.5 fL (ref 80.0–100.0)
Monocytes Absolute: 1 10*3/uL (ref 0.1–1.0)
Monocytes Relative: 8 %
Neutro Abs: 9.8 10*3/uL — ABNORMAL HIGH (ref 1.7–7.7)
Neutrophils Relative %: 84 %
Platelets: 235 10*3/uL (ref 150–400)
RBC: 3.18 MIL/uL — ABNORMAL LOW (ref 4.22–5.81)
RDW: 13.2 % (ref 11.5–15.5)
WBC: 11.8 10*3/uL — ABNORMAL HIGH (ref 4.0–10.5)
nRBC: 0 % (ref 0.0–0.2)

## 2018-09-07 MED ORDER — POTASSIUM CHLORIDE 10 MEQ/100ML IV SOLN
10.0000 meq | INTRAVENOUS | Status: AC
  Administered 2018-09-07 (×3): 10 meq via INTRAVENOUS
  Filled 2018-09-07 (×3): qty 100

## 2018-09-07 MED ORDER — MAGNESIUM SULFATE 2 GM/50ML IV SOLN
2.0000 g | Freq: Once | INTRAVENOUS | Status: AC
  Administered 2018-09-07: 10:00:00 2 g via INTRAVENOUS
  Filled 2018-09-07: qty 50

## 2018-09-07 MED ORDER — FINASTERIDE 5 MG PO TABS
5.0000 mg | ORAL_TABLET | Freq: Every day | ORAL | Status: DC
  Administered 2018-09-07 – 2018-09-09 (×3): 5 mg via ORAL
  Filled 2018-09-07 (×3): qty 1

## 2018-09-07 NOTE — Progress Notes (Signed)
Subjective: Patient appears in no distress.  Objective: Vital signs in last 24 hours: Temp:  [98.4 F (36.9 C)-100.6 F (38.1 C)] 98.4 F (36.9 C) (05/24 0524) Pulse Rate:  [85-109] 85 (05/24 0524) Resp:  [16-18] 16 (05/24 0524) BP: (95-104)/(46-54) 101/54 (05/24 0524) SpO2:  [92 %-98 %] 97 % (05/24 0524) Last BM Date: 09/06/18  Intake/Output from previous day: 05/23 0701 - 05/24 0700 In: 2237.6 [I.V.:1947.3; IV Piggyback:290.3] Out: 5140 [Urine:4500; Emesis/NG output:550; Drains:90] Intake/Output this shift: No intake/output data recorded.  General appearance: alert and no distress GI: soft, non-tender; bowel sounds normal; no masses,  no organomegaly and Incision healing well.  No drainage noted.  Lab Results:  Recent Labs    09/06/18 0638 09/07/18 0432  WBC 10.6* 11.8*  HGB 10.6* 9.1*  HCT 32.9* 29.1*  PLT 286 235   BMET Recent Labs    09/06/18 0638 09/07/18 0432  NA 141 145  K 3.4* 3.7  CL 102 103  CO2 28 30  GLUCOSE 117* 114*  BUN 13 17  CREATININE 1.43* 1.27*  CALCIUM 8.6* 8.5*   PT/INR No results for input(s): LABPROT, INR in the last 72 hours.  Studies/Results: Dg Chest 1 View  Result Date: 09/06/2018 CLINICAL DATA:  68 y/o  M; NG tube placement. EXAM: CHEST  1 VIEW COMPARISON:  08/16/2018 chest radiograph FINDINGS: NG tube is coiling in the gastric body with tip projecting over proximal stomach unchanged in position. Stable cardiac silhouette given projection and technique. Low lung volumes and increased pulmonary markings. Hazy opacification of the right lung may represent a layering effusion. Cholecystostomy tube projects over right upper quadrant. Bones are unremarkable. IMPRESSION: NG tube is coiling in the gastric body with tip projecting over proximal stomach unchanged in position. Stable low lung volumes and increased pulmonary markings. Hazy opacification of the right lung may represent a layering effusion. Electronically Signed   By: Mitzi HansenLance   Furusawa-Stratton M.D.   On: 09/06/2018 02:51   Dg Abd 1 View  Result Date: 09/06/2018 CLINICAL DATA:  Abdominal distension EXAM: ABDOMEN - 1 VIEW COMPARISON:  Sep 05, 2018 FINDINGS: Nasogastric tube tip and side port in stomach. There is a presumed Frost amic catheter on the right. There is moderate generalized bowel dilatation, less pronounced than on 1 day prior. No air-fluid levels or free air evident. Urinary bladder appears distended with contrast. There are skin staples overlying the lower abdomen and pelvis just to the right of midline. IMPRESSION: Suspect a degree of bowel ileus. There is less bowel dilatation compared to 1 day prior. Nasogastric tube tip and side port in stomach. Presumed nephrostomy catheter on the right. Urinary bladder distended. Electronically Signed   By: Bretta BangWilliam  Woodruff III M.D.   On: 09/06/2018 09:53   Ct Abdomen Pelvis W Contrast  Result Date: 09/05/2018 CLINICAL DATA:  68 year old male with history of biliary drain with extensive swelling and bruising in the abdomen. EXAM: CT ABDOMEN AND PELVIS WITH CONTRAST TECHNIQUE: Multidetector CT imaging of the abdomen and pelvis was performed using the standard protocol following bolus administration of intravenous contrast. CONTRAST:  100mL OMNIPAQUE IOHEXOL 300 MG/ML  SOLN COMPARISON:  CT the abdomen and pelvis 08/25/2018. FINDINGS: Lower chest: Moderate right and small left pleural effusions lying dependently with some associated passive subsegmental atelectasis in the lower lobes of the lungs bilaterally. Distal esophagus is patulous with mural thickening and mucosal hyperenhancement. Hepatobiliary: Status post left hemihepatectomy. Percutaneous cholecystostomy tube in position within a completely decompressed gallbladder. No suspicious hepatic lesions.  No intra or extrahepatic biliary ductal dilatation. Pancreas: No pancreatic mass. No pancreatic ductal dilatation. No pancreatic or peripancreatic fluid or inflammatory  changes. Spleen: Unremarkable. Adrenals/Urinary Tract: There are multiple nonobstructive calculi within the left renal collecting system and renal pelvis measuring up to 12 mm in the interpolar region. No suspicious renal lesions. Moderate right and severe left hydroureteronephrosis. No definite ureteral calculi. Small nonobstructive calculi are also noted lying dependently in the urinary bladder measuring up to 5 mm in diameter. Urinary bladder is moderately distended, thick-walled and highly trabeculated. Several small bladder diverticulae are noted, largest of which is in the superior aspect of the urinary bladder measuring up to 4.8 cm in diameter. Small amount of gas within the urinary bladder non dependently. Stomach/Bowel: Stomach is moderately distended with a large air-fluid level. No nasogastric tube is noted. Duodenum does not appear distended. Massive dilatation of multiple loops of small bowel measuring up to 8.0 cm in diameter with multiple air-fluid levels. No discrete transition point is confidently identified in the small bowel on today's examination. Colon is relatively decompressed, although there is gas and stool scattered throughout the colon and rectum. Normal appendix. Vascular/Lymphatic: Atherosclerotic calcifications in the pelvic vasculature. No aneurysm or dissection noted in the abdomen or pelvis. No lymphadenopathy noted in the abdomen or pelvis. Reproductive: Postoperative changes in the prostate gland suggesting prior TURP. Seminal vesicles are unremarkable in appearance. Other: Trace volume of ascites. No pneumoperitoneum. Musculoskeletal: Midline skin staples in the mid to lower abdomen. There are no aggressive appearing lytic or blastic lesions noted in the visualized portions of the skeleton. IMPRESSION: 1. Massive dilatation of the small bowel, without definite transition point. Small volume of gas and stool in a relatively decompressed colon. Findings are favored to reflect small  bowel ileus, which has been evident on numerous prior abdominal studies. The possibility of partial or early small-bowel obstruction is not entirely excluded, but not strongly favored at this time. 2. Small amount of gas non dependently in the urinary bladder, favored to be iatrogenic. In the absence of recent catheterization, correlation with urinalysis would be strongly recommended to exclude the possibility of urinary tract infection with gas-forming organisms. 3. Multiple nonobstructive calculi within the left renal collecting system and lying dependently in the lumen of the urinary bladder. No ureteral stones are noted at this time. Despite this, there is moderate right and severe left hydroureteronephrosis, which could be indicative of distal ureteral strictures, and is presumably exacerbated in the setting of a markedly distended urinary bladder. Urologic consultation is recommended. 4. Moderate right and small left pleural effusions lying dependently with some associated passive subsegmental atelectasis throughout the lower lobes of the lungs bilaterally. 5. Patulous fluid-filled esophagus which demonstrates mural thickening and hyper enhancement, which may simply reflect esophagitis, however, there is any clinical concern for Barrett's metaplasia or esophageal neoplasia, further evaluation with endoscopy could be considered. 6. Additional incidental findings, as above. Electronically Signed   By: Trudie Reed M.D.   On: 09/05/2018 12:20   Dg Chest Portable 1 View  Result Date: 09/05/2018 CLINICAL DATA:  Status post nasogastric tube placement. EXAM: PORTABLE CHEST 1 VIEW COMPARISON:  Radiographs of same day. FINDINGS: Stable cardiomediastinal silhouette. Hypoinflation of the lungs is noted with mild bibasilar subsegmental atelectasis. No pneumothorax is noted. Distal tip of nasogastric tube is seen in proximal stomach. Bony thorax is unremarkable. IMPRESSION: Hypoinflation of the lungs is noted with  mild bibasilar subsegmental atelectasis. Distal tip of nasogastric tube is seen in proximal  stomach. Electronically Signed   By: Lupita Raider M.D.   On: 09/05/2018 13:23   Dg Chest Port 1v Same Day  Result Date: 09/06/2018 CLINICAL DATA:  Nasogastric tube placement. EXAM: PORTABLE CHEST 1 VIEW COMPARISON:  Sep 06, 2018 study obtained earlier in the day FINDINGS: Nasogastric tube tip and side port are in the stomach, essentially unchanged from earlier in the day. There is a layering pleural effusion on the right. There is no edema or consolidation. There is mild cardiomegaly with pulmonary venous hypertension. No adenopathy. No bone lesions. IMPRESSION: Nasogastric tube tip and side port in the stomach with the tip directed superiorly in the proximal body of the stomach, unchanged from earlier in the day. They are in pleural effusion on the right. Pulmonary vascular congestion present without edema or consolidation. Electronically Signed   By: Bretta Bang III M.D.   On: 09/06/2018 07:58    Anti-infectives: Anti-infectives (From admission, onward)   None      Assessment/Plan: Impression: Ileus secondary to urinary retention has resolved.  Patient having multiple bowel movements. Plan: Remove NG tube and advance diet as tolerated.  LOS: 2 days    Franky Macho 09/07/2018

## 2018-09-07 NOTE — Progress Notes (Signed)
Patient NG tube is removed per MD order. Patient tolerated well with no signs of distress.

## 2018-09-07 NOTE — Progress Notes (Signed)
PROGRESS NOTE    [name]  XBM:[id number] DOB: 01-04-1951 DOA: 09/05/2018 PCP: [name], [name], MD    Brief Narrative:  68 year old male who was recently hospitalized for small bowel obstruction related tovolvulus, status post reduction by exploratory laparotomy. He had postoperative ileus that required nasogastric tube suctioning.He was discharged to skilled nursing facility yesterday,this morning had severe recurrence of his symptoms,consistent with abdominal distention, abdominal pain and vomiting, he required reinsertion of the nasogastric tube.On his initial physical examination his ill looking appearing, he has a nasogastric tube in place and mittens have been placed in his hands,to prevent him removing the NG tube.His blood pressure is 125/75, heart rate 92, respiratory rate 11, oxygen saturation 98%.He ispale, with dry mucous membranes, his lungs had rhonchi and ralesbilaterally, heart S1-S2 present and rhythmic, his abdomen wassignificantly distended, surgical wound was clean, biliary drain in place. Trace lower extremity edema. Sodium 138, potassium 3.5, chloride 101, bicarb 26, glucose 152, BUN 11, creatinine 1.0, white count 11.6, hemoglobin 11.9, hematocrit 37.9, platelets 303.Urinalysis with more than 50 white cells. Chest radiograph with low lung volumes and right base atelectasis, nasogastric tube tip below the diaphragm. Abdominal films with diffuse distention of the small and large bowel.  Patient was admitted to the hospital working diagnosis of recurrence of postoperativesmallbowel and colonic ileus.    Assessment & Plan:   Principal Problem:   Ileus (HCC) Active Problems:   HTN (hypertension)   Diabetes mellitus type 2, controlled (HCC)   Nonverbal   SBO (small bowel obstruction) (HCC)   Gangrenous cholecystitis   1.Severe postoperative ileus, small bowel and colonic involvement. patient has been tolerating well NG tube to low intermittent  suction. Output 550, his abdomen is more soft and non tender. Will continue to follow up on surgery, Dr. Lovell SheehanJenkins, recommendations. Continue supportive IV fluids for now, as needed IV antiemetics and analgesics.   2.Type 2 diabetes mellitus.His fasting glucose this am is 114, will continue glucose cover with insulin sliding scale. Has been NPO since admission.  3.BPH, complicated with bladder outlet obstruction, hydronephrosis and AKI. Renal function has been improving after foley catheter placement, urine output over last 24 h 4,500 ml. Continue tamsulosin and will add finesteride. IV fluids with balanced electrolyte solutions at 100 ml per H. Patient likely will be discharged with Foley catheter and follow up as outpatient with Urology.   4. Hypokalemia and hypomagensemia. Persistent hypokalemia, down to 3,7, in the setting of post obstructive diuresis, will continue K correction with Kcl, will repeat dose of 40 meq IV. Mg today at 1,7, will order 2 grams of Mag sulfate IV.   5.Cognitive impairment. His mentation continue to improve, but continue to be non verbal. Has a sitter at the bedside. He has been keeping his NG tube in place.   DVT prophylaxis: enoxaparin   Code Status: full Family Communication: no family at the bedside  Disposition Plan/ discharge barriers: pending clinical improvement    There is no height or weight on file to calculate BMI. Malnutrition Type:      Malnutrition Characteristics:      Nutrition Interventions:     RN Pressure Injury Documentation:    Consultants:   Surgery   Procedures:     Antimicrobials:       Subjective: Patient is more awake and alert, continue to be non verbal. No apparent pain or dyspnea. Has kept his NG tube in place. Continue to have a sitter in place.   Objective: Vitals:   09/06/18  1229 09/06/18 1340 09/07/18 0100 09/07/18 0524  BP:  (!) 104/48  (!) 101/54  Pulse:  (!) 109  85  Resp:  18  16   Temp: (!) 100.6 F (38.1 C) 99.3 F (37.4 C) 99.1 F (37.3 C) 98.4 F (36.9 C)  TempSrc: Oral Oral Oral Oral  SpO2:  92%  97%    Intake/Output Summary (Last 24 hours) at 09/07/2018 0850 Last data filed at 09/07/2018 0600 Gross per 24 hour  Intake 2237.6 ml  Output 5140 ml  Net -2902.4 ml   There were no vitals filed for this visit.  Examination:   General: deconditioned and ill looking appearing  Neurology: Awake and alert, non focal. Non verbal and not fully interactive.   E ENT: mild pallor, no icterus, oral mucosa moist Cardiovascular: No JVD. S1-S2 present, rhythmic, no gallops, rubs, or murmurs. No lower extremity edema. Pulmonary: positive breath sounds bilaterally, no wheezing, rhonchi or rales. Gastrointestinal. Abdomen mild distention, non tender, no organomegaly, no rebound or guarding. Biliary drain in place, surgical wounds clean.  Skin. No rashes Musculoskeletal: no joint deformities     Data Reviewed: I have personally reviewed following labs and imaging studies  CBC: Recent Labs  Lab 09/05/18 1019 09/06/18 0638 09/07/18 0432  WBC 11.6* 10.6* 11.8*  NEUTROABS 9.7*  --  9.8*  HGB 11.9* 10.6* 9.1*  HCT 37.9* 32.9* 29.1*  MCV 90.7 91.1 91.5  PLT 303 286 235   Basic Metabolic Panel: Recent Labs  Lab 09/01/18 0617 09/02/18 0515 09/03/18 0452 09/04/18 0641 09/05/18 1019 09/06/18 0638 09/07/18 0432  NA 146* 143 142 140 138 141 145  K 3.2* 3.4* 3.1* 3.6 3.5 3.4* 3.7  CL 110 108 110 105 101 102 103  CO2 27 24 29 27 26 28 30   GLUCOSE 114* 114* 130* 144* 152* 117* 114*  BUN 21 23 14 8 11 13 17   CREATININE 1.06 1.07 0.98 0.93 1.08 1.43* 1.27*  CALCIUM 8.3* 8.1* 7.9* 8.6* 9.3 8.6* 8.5*  MG 1.8 1.8 2.0 1.9  --   --  1.7  PHOS 2.7 2.5 2.2* 2.7  --   --   --    GFR: Estimated Creatinine Clearance: 52 mL/min (A) (by C-G formula based on SCr of 1.27 mg/dL (H)). Liver Function Tests: Recent Labs  Lab 09/02/18 0515 09/05/18 1019  AST 13* 12*  ALT  10 9  ALKPHOS 50 52  BILITOT 0.8 0.5  PROT 5.4* 7.1  ALBUMIN 2.4* 4.1   Recent Labs  Lab 09/05/18 1019  LIPASE 40   No results for input(s): AMMONIA in the last 168 hours. Coagulation Profile: No results for input(s): INR, PROTIME in the last 168 hours. Cardiac Enzymes: No results for input(s): CKTOTAL, CKMB, CKMBINDEX, TROPONINI in the last 168 hours. BNP (last 3 results) No results for input(s): PROBNP in the last 8760 hours. HbA1C: No results for input(s): HGBA1C in the last 72 hours. CBG: Recent Labs  Lab 09/06/18 0730 09/06/18 1107 09/06/18 1609 09/06/18 2146 09/07/18 0723  GLUCAP 98 106* 114* 118* 95   Lipid Profile: No results for input(s): CHOL, HDL, LDLCALC, TRIG, CHOLHDL, LDLDIRECT in the last 72 hours. Thyroid Function Tests: No results for input(s): TSH, T4TOTAL, FREET4, T3FREE, THYROIDAB in the last 72 hours. Anemia Panel: No results for input(s): VITAMINB12, FOLATE, FERRITIN, TIBC, IRON, RETICCTPCT in the last 72 hours.    Radiology Studies: I have reviewed all of the imaging during this hospital visit personally     Scheduled Meds: .  enoxaparin (LOVENOX) injection  40 mg Subcutaneous Q24H  . insulin aspart  0-9 Units Subcutaneous TID WC  . tamsulosin  0.4 mg Oral QPM   Continuous Infusions: . lactated ringers 100 mL/hr (09/07/18 0223)     LOS: 2 days         Annett Gula, MD

## 2018-09-08 DIAGNOSIS — K567 Ileus, unspecified: Secondary | ICD-10-CM | POA: Diagnosis not present

## 2018-09-08 LAB — CBC WITH DIFFERENTIAL/PLATELET
Abs Immature Granulocytes: 0.04 10*3/uL (ref 0.00–0.07)
Basophils Absolute: 0 10*3/uL (ref 0.0–0.1)
Basophils Relative: 0 %
Eosinophils Absolute: 0.2 10*3/uL (ref 0.0–0.5)
Eosinophils Relative: 2 %
HCT: 27.6 % — ABNORMAL LOW (ref 39.0–52.0)
Hemoglobin: 8.7 g/dL — ABNORMAL LOW (ref 13.0–17.0)
Immature Granulocytes: 0 %
Lymphocytes Relative: 12 %
Lymphs Abs: 1.3 10*3/uL (ref 0.7–4.0)
MCH: 28.9 pg (ref 26.0–34.0)
MCHC: 31.5 g/dL (ref 30.0–36.0)
MCV: 91.7 fL (ref 80.0–100.0)
Monocytes Absolute: 1 10*3/uL (ref 0.1–1.0)
Monocytes Relative: 9 %
Neutro Abs: 8.4 10*3/uL — ABNORMAL HIGH (ref 1.7–7.7)
Neutrophils Relative %: 77 %
Platelets: 225 10*3/uL (ref 150–400)
RBC: 3.01 MIL/uL — ABNORMAL LOW (ref 4.22–5.81)
RDW: 13.5 % (ref 11.5–15.5)
WBC: 10.9 10*3/uL — ABNORMAL HIGH (ref 4.0–10.5)
nRBC: 0 % (ref 0.0–0.2)

## 2018-09-08 LAB — GLUCOSE, CAPILLARY
Glucose-Capillary: 119 mg/dL — ABNORMAL HIGH (ref 70–99)
Glucose-Capillary: 145 mg/dL — ABNORMAL HIGH (ref 70–99)
Glucose-Capillary: 148 mg/dL — ABNORMAL HIGH (ref 70–99)
Glucose-Capillary: 174 mg/dL — ABNORMAL HIGH (ref 70–99)

## 2018-09-08 LAB — BASIC METABOLIC PANEL
Anion gap: 10 (ref 5–15)
BUN: 15 mg/dL (ref 8–23)
CO2: 28 mmol/L (ref 22–32)
Calcium: 8.1 mg/dL — ABNORMAL LOW (ref 8.9–10.3)
Chloride: 102 mmol/L (ref 98–111)
Creatinine, Ser: 1.11 mg/dL (ref 0.61–1.24)
GFR calc Af Amer: >60 mL/min (ref >60)
GFR calc non Af Amer: >60 mL/min (ref >60)
Glucose, Bld: 165 mg/dL — ABNORMAL HIGH (ref 70–99)
Potassium: 3.2 mmol/L — ABNORMAL LOW (ref 3.5–5.1)
Sodium: 140 mmol/L (ref 135–145)

## 2018-09-08 LAB — MAGNESIUM: Magnesium: 1.8 mg/dL (ref 1.7–2.4)

## 2018-09-08 MED ORDER — POTASSIUM CHLORIDE 10 MEQ/100ML IV SOLN
10.0000 meq | INTRAVENOUS | Status: AC
  Administered 2018-09-08 (×4): 10 meq via INTRAVENOUS
  Filled 2018-09-08: qty 100

## 2018-09-08 NOTE — Progress Notes (Signed)
  Subjective: No complaints.  Objective: Vital signs in last 24 hours: Temp:  [98.4 F (36.9 C)-99 F (37.2 C)] 98.4 F (36.9 C) (05/25 0511) Pulse Rate:  [82-104] 82 (05/25 0511) Resp:  [16-20] 16 (05/25 0511) BP: (101-121)/(56-95) 101/61 (05/25 0511) SpO2:  [93 %-98 %] 96 % (05/25 0511) Last BM Date: 09/07/18  Intake/Output from previous day: 05/24 0701 - 05/25 0700 In: 480 [P.O.:480] Out: 1951 [Urine:1800; Drains:150] Intake/Output this shift: No intake/output data recorded.  General appearance: alert, cooperative and no distress GI: soft, non-tender; bowel sounds normal; no masses,  no organomegaly and Incision healing well  Lab Results:  Recent Labs    09/07/18 0432 09/08/18 0416  WBC 11.8* 10.9*  HGB 9.1* 8.7*  HCT 29.1* 27.6*  PLT 235 225   BMET Recent Labs    09/07/18 0432 09/08/18 0416  NA 145 140  K 3.7 3.2*  CL 103 102  CO2 30 28  GLUCOSE 114* 165*  BUN 17 15  CREATININE 1.27* 1.11  CALCIUM 8.5* 8.1*   PT/INR No results for input(s): LABPROT, INR in the last 72 hours.  Studies/Results: Dg Abd 1 View  Result Date: 09/06/2018 CLINICAL DATA:  Abdominal distension EXAM: ABDOMEN - 1 VIEW COMPARISON:  Sep 05, 2018 FINDINGS: Nasogastric tube tip and side port in stomach. There is a presumed Frost amic catheter on the right. There is moderate generalized bowel dilatation, less pronounced than on 1 day prior. No air-fluid levels or free air evident. Urinary bladder appears distended with contrast. There are skin staples overlying the lower abdomen and pelvis just to the right of midline. IMPRESSION: Suspect a degree of bowel ileus. There is less bowel dilatation compared to 1 day prior. Nasogastric tube tip and side port in stomach. Presumed nephrostomy catheter on the right. Urinary bladder distended. Electronically Signed   By: Bretta Bang III M.D.   On: 09/06/2018 09:53    Anti-infectives: Anti-infectives (From admission, onward)   None       Assessment/Plan: Impression: Postoperative ileus secondary to urinary retention, resolved Plan: Advance diet as tolerated.  Follow-up with Dr. Henreitta Leber as previously scheduled.  LOS: 3 days    Franky Macho 09/08/2018

## 2018-09-08 NOTE — Care Management Important Message (Signed)
Important Message  Patient Details  Name: [name] MRN: [id number] Date of Birth: August 01, 1950   Medicare Important Message Given:  Yes    [name] 09/08/2018, 12:03 PM

## 2018-09-08 NOTE — NC FL2 (Signed)
[place] MEDICAID FL2 LEVEL OF CARE SCREENING TOOL     IDENTIFICATION  Patient Name: [name] Birthdate: 1950/07/13 Sex: male Admission Date (Current Location): 09/05/2018  [facility] and [place] Number:  [employer] and Address:  [facility],  [address], [place] [address]      Provider Number: [id number]  Attending Physician Name and Address:  Erick Blinks, DO  Relative Name and Phone Number:  Otelia Sergeant [phone]    Current Level of Care: Hospital Recommended Level of Care: Skilled Nursing Facility Prior Approval Number:    Date Approved/Denied:   PASRR Number:  [id number]   Discharge Plan: SNF    Current Diagnoses: Patient Active Problem List   Diagnosis Date Noted  . Gangrenous cholecystitis 09/05/2018  . Volvulus (HCC) 09/03/2018  . Ileus (HCC)   . Pneumatosis of intestines   . Gall bladder disease 08/25/2018  . Small bowel ischemia (HCC)   . Acute gangrenous cholecystitis 05/24/2018  . SBO (small bowel obstruction) (HCC) 05/24/2018  . Abdominal distention   . Pressure injury of skin 04/09/2018  . C. difficile diarrhea 04/08/2018  . Bilateral hydronephrosis 04/08/2018  . Pneumoperitoneum of unknown etiology   . Diarrhea 04/07/2018  . Nonverbal 04/07/2018  . Hydronephrosis with urinary obstruction due to ureteral calculus 06/20/2016  . Sepsis secondary to UTI (HCC) 06/20/2016  . Fever 06/19/2016  . Generalized weakness 06/19/2016  . Diabetes mellitus type 2, controlled (HCC) 06/19/2016  . Severe sepsis with septic shock (HCC) 06/19/2016  . UTI (urinary tract infection) 06/19/2016  . Hematuria 06/19/2016  . AKI (acute kidney injury) (HCC) 06/19/2016  . Hyponatremia 06/19/2016  . Hypokalemia 06/19/2016  . BPH (benign prostatic hyperplasia) 06/19/2016  . 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 Urinary retention 05/07/2014  . Intellectual disability 05/07/2014  . Hyperlipidemia 05/07/2014  . HTN (hypertension)  05/07/2014    Orientation RESPIRATION BLADDER Height & Weight     Self  Normal Incontinent Weight:   Height:     BEHAVIORAL SYMPTOMS/MOOD NEUROLOGICAL BOWEL NUTRITION STATUS    (NA) Continent Diet(soft diet; aspiration precautions)  AMBULATORY STATUS COMMUNICATION OF NEEDS Skin   Extensive Assist Non-Verbally (biliary drain to RUQ)                       Personal Care Assistance Level of Assistance  Bathing, Feeding, Dressing Bathing Assistance: Limited assistance Feeding assistance: Independent Dressing Assistance: Limited assistance     Functional Limitations Info  Sight, Hearing, Speech Sight Info: Adequate Hearing Info: Adequate Speech Info: Impaired(non-verbal)    SPECIAL CARE FACTORS FREQUENCY  PT (By licensed PT), Speech therapy     PT Frequency: 5 days/week       Speech Therapy Frequency: 5 days/week      Contractures Contractures Info: Present    Additional Factors Info  Insulin Sliding Scale Code Status Info: full Allergies Info: NKDA   Insulin Sliding Scale Info: see DC summary       Current Medications (09/08/2018):  This is the current hospital active medication list Current Facility-Administered Medications  Medication Dose Route Frequency Provider Last Rate Last Dose  . acetaminophen (TYLENOL) tablet 650 mg  650 mg Oral Q6H PRN Arrien, York Ram, MD       Or  . acetaminophen (TYLENOL) suppository 650 mg  650 mg Rectal Q6H PRN Arrien, York Ram, MD      . enoxaparin (LOVENOX) injection 40 mg  40 mg Subcutaneous Q24H Arrien, York Ram, MD  40 mg at 09/07/18 1755  . finasteride (PROSCAR) tablet 5 mg  5 mg Oral Daily Arrien, York RamMauricio Daniel, MD   5 mg at 09/08/18 0819  . insulin aspart (novoLOG) injection 0-9 Units  0-9 Units Subcutaneous TID WC Arrien, York RamMauricio Daniel, MD   1 Units at 09/08/18 1202  . lactated ringers infusion   Intravenous Continuous Franky MachoJenkins, Mark, MD 10 mL/hr at 09/08/18 1134    . morphine 2 MG/ML  injection 2 mg  2 mg Intravenous Q2H PRN Arrien, York RamMauricio Daniel, MD      . ondansetron [facility](ZOFRAN) tablet 4 mg  4 mg Oral Q6H PRN Arrien, York RamMauricio Daniel, MD       Or  . ondansetron [facility](ZOFRAN) injection 4 mg  4 mg Intravenous Q6H PRN Arrien, York RamMauricio Daniel, MD      . potassium chloride 10 mEq in 100 mL IVPB  10 mEq Intravenous Q1 Hr x 4 Shah, Pratik D, DO 100 mL/hr at 09/08/18 1252 10 mEq at 09/08/18 1252  . tamsulosin (FLOMAX) capsule 0.4 mg  0.4 mg Oral QPM Arrien, York RamMauricio Daniel, MD   0.4 mg at 09/07/18 1755     Discharge Medications: Please see discharge summary for a list of discharge medications.  Relevant Imaging Results:  Relevant Lab Results:   Additional Information SS# [id number]  [name], Era SkeenJessica Demske, [place]

## 2018-09-08 NOTE — Progress Notes (Signed)
PROGRESS NOTE    [name]  HYQ:[id number] DOB: [birth year] DOA: 09/05/2018 PCP: [name], [name], MD   Brief Narrative:  Per HPI: 68 year old male who was recently hospitalized for small bowel obstruction related tovolvulus, status post reduction by exploratory laparotomy. He had postoperative ileus that required nasogastric tube suctioning.He was discharged to skilled nursing facility yesterday,this morning had severe recurrence of his symptoms,consistent with abdominal distention, abdominal pain and vomiting, he required reinsertion of the nasogastric tube.On his initial physical examination his ill looking appearing, he has a nasogastric tube in place and mittens have been placed in his hands,to prevent him removing the NG tube.His blood pressure is 125/75, heart rate 92, respiratory rate 11, oxygen saturation 98%.He ispale, with dry mucous membranes, his lungs had rhonchi and ralesbilaterally, heart S1-S2 present and rhythmic, his abdomen wassignificantly distended, surgical wound was clean, biliary drain in place. Trace lower extremity edema. Sodium 138, potassium 3.5, chloride 101, bicarb 26, glucose 152, BUN 11, creatinine 1.0, white count 11.6, hemoglobin 11.9, hematocrit 37.9, platelets 303.Urinalysis with more than 50 white cells. Chest radiograph with low lung volumes and right base atelectasis, nasogastric tube tip below the diaphragm. Abdominal films with diffuse distention of the small and large bowel.  Patient was admitted to the hospital working diagnosis of recurrence of postoperativesmallbowel and colonic ileus.   He appears to be slowly improving and is now tolerating a full liquid diet.  He continues to have some hypokalemia.   Assessment & Plan:   Principal Problem:   Ileus (HCC) Active Problems:   HTN (hypertension)   Diabetes mellitus type 2, controlled (HCC)   Nonverbal   SBO (small bowel obstruction) (HCC)   Gangrenous cholecystitis   1.Severe postoperative ileus, small bowel and colonic involvement. -This appears to be improving and patient is tolerating full liquid diet, will advance to soft today   2.Type 2 diabetes mellitus.His fasting glucose this am is 165, will continue glucose cover with insulin sliding scale.  -Advance diet to soft today and monitor  3.BPH, complicated with bladder outlet obstruction, hydronephrosis and AKI.Renal function has been improving after foley catheter placement, urine output over last 24 h 4,500 ml. Continue tamsulosin and will add finesteride. IV fluids with balanced electrolyte solutions at 100 ml per H. Patient likely will be discharged with Foley catheter and follow up as outpatient with Urology.   4. Hypokalemia and hypomagensemia. -Replete potassium today and recheck levels of both in a.m.  5.Cognitive impairment. -Mentation is stable. -No signs of agitation and patient is otherwise nonverbal with no need for sitter at this time  DVT prophylaxis:enoxaparin Code Status:full Family Communication:no family at the bedside Disposition Plan: Plan for SNF in a.m. if tolerating diet and no further issues.  Removing Foley today.  Consultants:   General Surgery  Procedures:   None  Antimicrobials:   none   Subjective: Patient seen and evaluated today with no new acute complaints or concerns. No acute concerns or events noted overnight.  He appears to be doing well and tolerating full liquid diet.  Continues to have good urine output per Foley catheter.  Objective: Vitals:   09/07/18 1429 09/07/18 2057 09/08/18 0511 09/08/18 0838  BP: (!) 121/95  101/61   Pulse: (!) 104 100 82   Resp: 16 20 16    Temp: 98.7 F (37.1 C)  98.4 F (36.9 C)   TempSrc: Oral  Oral   SpO2: 98% 93% 96% 97%    Intake/Output Summary (Last 24 hours) at 09/08/2018 1115 Last  data filed at 09/08/2018 0641 Gross per 24 hour  Intake 480 ml  Output 950 ml  Net -470 ml    There were no vitals filed for this visit.  Examination:  General exam: Appears calm and comfortable  Respiratory system: Clear to auscultation. Respiratory effort normal. Cardiovascular system: S1 & S2 heard, RRR. No JVD, murmurs, rubs, gallops or clicks. No pedal edema. Gastrointestinal system: Abdomen is nondistended, soft and nontender. No organomegaly or masses felt. Normal bowel sounds heard. Central nervous system: Alert and nonverbal Extremities: Symmetric 5 x 5 power. Skin: No rashes, lesions or ulcers Psychiatry: Difficult to assess Foley with clear, yellow urine output noted.    Data Reviewed: I have personally reviewed following labs and imaging studies  CBC: Recent Labs  Lab 09/05/18 1019 09/06/18 0638 09/07/18 0432 09/08/18 0416  WBC 11.6* 10.6* 11.8* 10.9*  NEUTROABS 9.7*  --  9.8* 8.4*  HGB 11.9* 10.6* 9.1* 8.7*  HCT 37.9* 32.9* 29.1* 27.6*  MCV 90.7 91.1 91.5 91.7  PLT 303 286 235 225   Basic Metabolic Panel: Recent Labs  Lab 09/02/18 0515 09/03/18 0452 09/04/18 0641 09/05/18 1019 09/06/18 0638 09/07/18 0432 09/08/18 0416  NA 143 142 140 138 141 145 140  K 3.4* 3.1* 3.6 3.5 3.4* 3.7 3.2*  CL 108 110 105 101 102 103 102  CO2 GLUCOSE 114* 130* 144* 152* 117* 114* 165*  BUN CREATININE 1.07 0.98 0.93 1.08 1.43* 1.27* 1.11  CALCIUM 8.1* 7.9* 8.6* 9.3 8.6* 8.5* 8.1*  MG 1.8 2.0 1.9  --   --  1.7 1.8  PHOS 2.5 2.2* 2.7  --   --   --   --    GFR: Estimated Creatinine Clearance: 59.5 mL/min (by C-G formula based on SCr of 1.11 mg/dL). Liver Function Tests: Recent Labs  Lab 09/02/18 0515 09/05/18 1019  AST 13* 12*  ALT 10 9  ALKPHOS 50 52  BILITOT 0.8 0.5  PROT 5.4* 7.1  ALBUMIN 2.4* 4.1   Recent Labs  Lab 09/05/18 1019  LIPASE 40   No results for input(s): AMMONIA in the last 168 hours. Coagulation Profile: No results for input(s): INR, PROTIME in the last 168 hours. Cardiac Enzymes: No  results for input(s): CKTOTAL, CKMB, CKMBINDEX, TROPONINI in the last 168 hours. BNP (last 3 results) No results for input(s): PROBNP in the last 8760 hours. HbA1C: No results for input(s): HGBA1C in the last 72 hours. CBG: Recent Labs  Lab 09/07/18 0723 09/07/18 1110 09/07/18 1619 09/07/18 2156 09/08/18 0724  GLUCAP 95 99 154* 171* 145*   Lipid Profile: No results for input(s): CHOL, HDL, LDLCALC, TRIG, CHOLHDL, LDLDIRECT in the last 72 hours. Thyroid Function Tests: No results for input(s): TSH, T4TOTAL, FREET4, T3FREE, THYROIDAB in the last 72 hours. Anemia Panel: No results for input(s): VITAMINB12, FOLATE, FERRITIN, TIBC, IRON, RETICCTPCT in the last 72 hours. Sepsis Labs: No results for input(s): PROCALCITON, LATICACIDVEN in the last 168 hours.  Recent Results (from the past 240 hour(s))  SARS Coronavirus 2 (CEPHEID - Performed in [facility] hospital lab), Hosp Order     Status: None   Collection Time: 09/06/18 12:30 PM  Result Value Ref Range Status   SARS Coronavirus 2 NEGATIVE NEGATIVE Final    Comment: (NOTE) If result is NEGATIVE SARS-CoV-2 target nucleic acids are NOT DETECTED. The SARS-CoV-2 RNA is generally detectable in upper and lower  respiratory specimens during  the acute phase of infection. The lowest  concentration of SARS-CoV-2 viral copies this assay can detect is 250  copies / mL. A negative result does not preclude SARS-CoV-2 infection  and should not be used as the sole basis for treatment or other  patient management decisions.  A negative result may occur with  improper specimen collection / handling, submission of specimen other  than nasopharyngeal swab, presence of viral mutation(s) within the  areas targeted by this assay, and inadequate number of viral copies  (<250 copies / mL). A negative result must be combined with clinical  observations, patient history, and epidemiological information. If result is POSITIVE SARS-CoV-2 target nucleic  acids are DETECTED. The SARS-CoV-2 RNA is generally detectable in upper and lower  respiratory specimens dur ing the acute phase of infection.  Positive  results are indicative of active infection with SARS-CoV-2.  Clinical  correlation with patient history and other diagnostic information is  necessary to determine patient infection status.  Positive results do  not rule out bacterial infection or co-infection with other viruses. If result is PRESUMPTIVE POSTIVE SARS-CoV-2 nucleic acids MAY BE PRESENT.   A presumptive positive result was obtained on the submitted specimen  and confirmed on repeat testing.  While 2019 novel coronavirus  (SARS-CoV-2) nucleic acids may be present in the submitted sample  additional confirmatory testing may be necessary for epidemiological  and / or clinical management purposes  to differentiate between  SARS-CoV-2 and other Sarbecovirus currently known to infect humans.  If clinically indicated additional testing with an alternate test  methodology [phone]) is advised. The SARS-CoV-2 RNA is generally  detectable in upper and lower respiratory sp ecimens during the acute  phase of infection. The expected result is Negative. Fact Sheet for Patients:  BoilerBrush.com.cy Fact Sheet for Healthcare Providers: https://pope.com/ This test is not yet approved or cleared by the [place] FDA and has been authorized for detection and/or diagnosis of SARS-CoV-2 by FDA under an Emergency Use Authorization (EUA).  This EUA will remain in effect (meaning this test can be used) for the duration of the COVID-19 declaration under Section 564(b)(1) of the Act, 21 U.S.C. section 360bbb-3(b)(1), unless the authorization is terminated or revoked sooner. Performed at [facility], [address], [place], [place] [address]          Radiology Studies: No results found.      Scheduled Meds: . enoxaparin  (LOVENOX) injection  40 mg Subcutaneous Q24H  . finasteride  5 mg Oral Daily  . insulin aspart  0-9 Units Subcutaneous TID WC  . tamsulosin  0.4 mg Oral QPM   Continuous Infusions: . lactated ringers 100 mL/hr at 09/08/18 0259  . potassium chloride       LOS: 3 days    Time spent: 30 minutes    Rajendra Spiller Hoover Brunette, DO [facility] Pager [phone]  If 7PM-7AM, please contact night-coverage [email or web address] Password [email or web address] 09/08/2018, 11:15 AM

## 2018-09-09 ENCOUNTER — Non-Acute Institutional Stay (SKILLED_NURSING_FACILITY): Payer: Medicare Other | Admitting: Adult Health

## 2018-09-09 ENCOUNTER — Inpatient Hospital Stay
Admission: RE | Admit: 2018-09-09 | Discharge: 2018-09-17 | Disposition: A | Discharge status: Nursing Home (Includes ICF) | Payer: Medicare Other | Source: Ambulatory Visit | Attending: Internal Medicine | Admitting: Internal Medicine

## 2018-09-09 ENCOUNTER — Encounter: Payer: Self-pay | Admitting: Adult Health

## 2018-09-09 DIAGNOSIS — K810 Acute cholecystitis: Secondary | ICD-10-CM

## 2018-09-09 DIAGNOSIS — E119 Type 2 diabetes mellitus without complications: Secondary | ICD-10-CM | POA: Diagnosis not present

## 2018-09-09 DIAGNOSIS — K567 Ileus, unspecified: Secondary | ICD-10-CM | POA: Diagnosis not present

## 2018-09-09 DIAGNOSIS — N138 Other obstructive and reflux uropathy: Secondary | ICD-10-CM

## 2018-09-09 DIAGNOSIS — K562 Volvulus: Secondary | ICD-10-CM | POA: Diagnosis not present

## 2018-09-09 DIAGNOSIS — E43 Unspecified severe protein-calorie malnutrition: Secondary | ICD-10-CM | POA: Insufficient documentation

## 2018-09-09 DIAGNOSIS — K56609 Unspecified intestinal obstruction, unspecified as to partial versus complete obstruction: Secondary | ICD-10-CM

## 2018-09-09 DIAGNOSIS — D62 Acute posthemorrhagic anemia: Secondary | ICD-10-CM

## 2018-09-09 DIAGNOSIS — N401 Enlarged prostate with lower urinary tract symptoms: Secondary | ICD-10-CM

## 2018-09-09 DIAGNOSIS — E876 Hypokalemia: Secondary | ICD-10-CM

## 2018-09-09 LAB — CBC
HCT: 29 % — ABNORMAL LOW (ref 39.0–52.0)
Hemoglobin: 8.9 g/dL — ABNORMAL LOW (ref 13.0–17.0)
MCH: 28.3 pg (ref 26.0–34.0)
MCHC: 30.7 g/dL (ref 30.0–36.0)
MCV: 92.4 fL (ref 80.0–100.0)
Platelets: 254 10*3/uL (ref 150–400)
RBC: 3.14 MIL/uL — ABNORMAL LOW (ref 4.22–5.81)
RDW: 13.2 % (ref 11.5–15.5)
WBC: 6.1 10*3/uL (ref 4.0–10.5)
nRBC: 0 % (ref 0.0–0.2)

## 2018-09-09 LAB — BASIC METABOLIC PANEL
Anion gap: 8 (ref 5–15)
BUN: 7 mg/dL — ABNORMAL LOW (ref 8–23)
CO2: 28 mmol/L (ref 22–32)
Calcium: 8.1 mg/dL — ABNORMAL LOW (ref 8.9–10.3)
Chloride: 102 mmol/L (ref 98–111)
Creatinine, Ser: 0.89 mg/dL (ref 0.61–1.24)
GFR calc Af Amer: >60 mL/min (ref >60)
GFR calc non Af Amer: >60 mL/min (ref >60)
Glucose, Bld: 149 mg/dL — ABNORMAL HIGH (ref 70–99)
Potassium: 3.3 mmol/L — ABNORMAL LOW (ref 3.5–5.1)
Sodium: 138 mmol/L (ref 135–145)

## 2018-09-09 LAB — MAGNESIUM: Magnesium: 1.6 mg/dL — ABNORMAL LOW (ref 1.7–2.4)

## 2018-09-09 LAB — GLUCOSE, CAPILLARY
Glucose-Capillary: 139 mg/dL — ABNORMAL HIGH (ref 70–99)
Glucose-Capillary: 205 mg/dL — ABNORMAL HIGH (ref 70–99)

## 2018-09-09 MED ORDER — MAGNESIUM SULFATE 2 GM/50ML IV SOLN
2.0000 g | Freq: Once | INTRAVENOUS | Status: AC
  Administered 2018-09-09: 2 g via INTRAVENOUS
  Filled 2018-09-09: qty 50

## 2018-09-09 MED ORDER — FINASTERIDE 5 MG PO TABS: 5.0000 mg | ORAL_TABLET | Freq: Every day | ORAL | 0 refills | Status: AC

## 2018-09-09 MED ORDER — POTASSIUM CHLORIDE ER 20 MEQ PO TBCR: 40.0000 meq | EXTENDED_RELEASE_TABLET | Freq: Every day | ORAL | 0 refills | Status: DC

## 2018-09-09 MED ORDER — MAGNESIUM OXIDE 400 MG PO TABS: 400.0000 mg | ORAL_TABLET | Freq: Every day | ORAL | 0 refills | Status: AC

## 2018-09-09 MED ORDER — POTASSIUM CHLORIDE CRYS ER 20 MEQ PO TBCR
40.0000 meq | EXTENDED_RELEASE_TABLET | Freq: Once | ORAL | Status: AC
  Administered 2018-09-09: 40 meq via ORAL
  Filled 2018-09-09: qty 2

## 2018-09-09 NOTE — Progress Notes (Signed)
Report called to [facility] at this time and given to [place].  Patient to return to  [facility] after lunch

## 2018-09-09 NOTE — Discharge Summary (Signed)
Physician Discharge Summary  [name] ZOX:[id number] DOB: March 21, 1951 DOA: 09/05/2018  PCP: Pearson Grippe, MD  Admit date: 09/05/2018  Discharge date: 09/09/2018  Admitted From:Home  Disposition:  SNF  Recommendations for Outpatient Follow-up:  1. Follow up with PCP in 1-2 weeks 2. Repeat BMP and magnesium in 1 week 3. Please follow-up with Dr. Henreitta Leber of general surgery as scheduled on 09/16/2018 4. Follow-up with urology for Foley catheter management in 1 to 2 weeks  Home Health: None  Equipment/Devices: None  Discharge Condition: Stable  CODE STATUS: Full  Diet recommendation: Heart Healthy/carb modified  Brief/Interim Summary: Per HPI: 68 year old male who was recently hospitalized for small bowel obstruction related tovolvulus, status post reduction by exploratory laparotomy. He had postoperative ileus that required nasogastric tube suctioning.He was discharged to skilled nursing facility yesterday,this morning had severe recurrence of his symptoms,consistent with abdominal distention, abdominal pain and vomiting, he required reinsertion of the nasogastric tube.On his initial physical examination his ill looking appearing, he has a nasogastric tube in place and mittens have been placed in his hands,to prevent him removing the NG tube.His blood pressure is 125/75, heart rate 92, respiratory rate 11, oxygen saturation 98%.He ispale, with dry mucous membranes, his lungs had rhonchi and ralesbilaterally, heart S1-S2 present and rhythmic, his abdomen wassignificantly distended, surgical wound was clean, biliary drain in place. Trace lower extremity edema. Sodium 138, potassium 3.5, chloride 101, bicarb 26, glucose 152, BUN 11, creatinine 1.0, white count 11.6, hemoglobin 11.9, hematocrit 37.9, platelets 303.Urinalysis with more than 50 white cells. Chest radiograph with low lung volumes and right base atelectasis, nasogastric tube tip below the diaphragm. Abdominal  films with diffuse distention of the small and large bowel.  Patient was admitted to the hospital working diagnosis of recurrence of postoperativesmallbowel and colonic ileus.    Patient has improved with conservative management and has had recurrent hypokalemia and hypomagnesemia which were likely contributing to the problem.  He has slowly improved over several days and is now tolerating a soft diet.  He has persistent hypokalemia and hypomagnesemia for which repletion will be needed on a daily basis, and therefore patient has been prescribed magnesium oxide and potassium tablets.  He will need recheck on these levels in 1 week.  He will also need follow-up with urology for complicated bladder outlet obstruction for which Foley will be retained.  He has continued tamsulosin and finasteride has been added.  No other acute events have been noted during the course of this admission.  He is otherwise stable for discharge.  Discharge Diagnoses:  Principal Problem:   Ileus (HCC) Active Problems:   HTN (hypertension)   Diabetes mellitus type 2, controlled (HCC)   Nonverbal   SBO (small bowel obstruction) (HCC)   Gangrenous cholecystitis  Principal discharge diagnosis: Severe postoperative ileus with small bowel and colonic involvement with recurrent hypokalemia and hypomagnesemia.  Discharge Instructions  Discharge Instructions    Diet - low sodium heart healthy   Complete by:  As directed    Increase activity slowly   Complete by:  As directed      Allergies as of 09/09/2018   No Known Allergies     Medication List    TAKE these medications   aspirin EC 81 MG tablet Take 81 mg by mouth every morning.   finasteride 5 MG tablet Commonly known as:  PROSCAR Take 1 tablet (5 mg total) by mouth daily for 30 days. Start taking on:  Sep 10, 2018   magnesium oxide  400 MG tablet Commonly known as:  MAG-OX Take 1 tablet (400 mg total) by mouth daily for 30 days.   metFORMIN 500 MG  tablet Commonly known as:  GLUCOPHAGE Take 1,000 mg by mouth every morning.   phosphorus [id number] MG tablet Commonly known as:  K PHOS NEUTRAL Take 1 tablet (250 mg total) by mouth daily.   Potassium Chloride ER 20 MEQ Tbcr Take 40 mEq by mouth daily for 30 days.   simethicone 40 MG/0.6ML drops Commonly known as:  MYLICON Take 0.6 mLs (40 mg total) by mouth 4 (four) times daily.   tamsulosin 0.4 MG Caps capsule Commonly known as:  FLOMAX Take 1 capsule (0.4 mg total) by mouth every evening.   TYLENOL ARTHRITIS PAIN PO Take 1 tablet by mouth every 6 (six) hours as needed (pain).      Follow-up Information    [name], MD. Go on 09/16/2018.   Specialty:  General Surgery Why:  See Dr. Henreitta Leber at 10:30am Contact information: [address] [place] [facility] [address] [phone]        Pearson Grippe, MD Follow up in 1 week(s).   Specialty:  Internal Medicine Contact information: [address] [place] [address] [phone]          No Known Allergies  Consultations:  General surgery   Procedures/Studies: Dg Chest 1 View  Result Date: 09/06/2018 CLINICAL DATA:  68 y/o  M; NG tube placement. EXAM: CHEST  1 VIEW COMPARISON:  08/16/2018 chest radiograph FINDINGS: NG tube is coiling in the gastric body with tip projecting over proximal stomach unchanged in position. Stable cardiac silhouette given projection and technique. Low lung volumes and increased pulmonary markings. Hazy opacification of the right lung may represent a layering effusion. Cholecystostomy tube projects over right upper quadrant. Bones are unremarkable. IMPRESSION: NG tube is coiling in the gastric body with tip projecting over proximal stomach unchanged in position. Stable low lung volumes and increased pulmonary markings. Hazy opacification of the right lung may represent a layering effusion. Electronically Signed   By: Mitzi Hansen M.D.   On: 09/06/2018  02:51   Dg Abd 1 View  Result Date: 09/06/2018 CLINICAL DATA:  Abdominal distension EXAM: ABDOMEN - 1 VIEW COMPARISON:  Sep 05, 2018 FINDINGS: Nasogastric tube tip and side port in stomach. There is a presumed Frost amic catheter on the right. There is moderate generalized bowel dilatation, less pronounced than on 1 day prior. No air-fluid levels or free air evident. Urinary bladder appears distended with contrast. There are skin staples overlying the lower abdomen and pelvis just to the right of midline. IMPRESSION: Suspect a degree of bowel ileus. There is less bowel dilatation compared to 1 day prior. Nasogastric tube tip and side port in stomach. Presumed nephrostomy catheter on the right. Urinary bladder distended. Electronically Signed   By: Bretta Bang III M.D.   On: 09/06/2018 09:53   Ct Abdomen Pelvis W Contrast  Result Date: 09/05/2018 CLINICAL DATA:  68 year old male with history of biliary drain with extensive swelling and bruising in the abdomen. EXAM: CT ABDOMEN AND PELVIS WITH CONTRAST TECHNIQUE: Multidetector CT imaging of the abdomen and pelvis was performed using the standard protocol following bolus administration of intravenous contrast. CONTRAST:  OMNIPAQUE IOHEXOL 300 MG/ML  SOLN COMPARISON:  CT the abdomen and pelvis 08/25/2018. FINDINGS: Lower chest: Moderate right and small left pleural effusions lying dependently with some associated passive subsegmental atelectasis in the lower lobes of the lungs  bilaterally. Distal esophagus is patulous with mural thickening and mucosal hyperenhancement. Hepatobiliary: Status post left hemihepatectomy. Percutaneous cholecystostomy tube in position within a completely decompressed gallbladder. No suspicious hepatic lesions. No intra or extrahepatic biliary ductal dilatation. Pancreas: No pancreatic mass. No pancreatic ductal dilatation. No pancreatic or peripancreatic fluid or inflammatory changes. Spleen: Unremarkable. Adrenals/Urinary  Tract: There are multiple nonobstructive calculi within the left renal collecting system and renal pelvis measuring up to 12 mm in the interpolar region. No suspicious renal lesions. Moderate right and severe left hydroureteronephrosis. No definite ureteral calculi. Small nonobstructive calculi are also noted lying dependently in the urinary bladder measuring up to 5 mm in diameter. Urinary bladder is moderately distended, thick-walled and highly trabeculated. Several small bladder diverticulae are noted, largest of which is in the superior aspect of the urinary bladder measuring up to 4.8 cm in diameter. Small amount of gas within the urinary bladder non dependently. Stomach/Bowel: Stomach is moderately distended with a large air-fluid level. No nasogastric tube is noted. Duodenum does not appear distended. Massive dilatation of multiple loops of small bowel measuring up to 8.0 cm in diameter with multiple air-fluid levels. No discrete transition point is confidently identified in the small bowel on today's examination. Colon is relatively decompressed, although there is gas and stool scattered throughout the colon and rectum. Normal appendix. Vascular/Lymphatic: Atherosclerotic calcifications in the pelvic vasculature. No aneurysm or dissection noted in the abdomen or pelvis. No lymphadenopathy noted in the abdomen or pelvis. Reproductive: Postoperative changes in the prostate gland suggesting prior TURP. Seminal vesicles are unremarkable in appearance. Other: Trace volume of ascites. No pneumoperitoneum. Musculoskeletal: Midline skin staples in the mid to lower abdomen. There are no aggressive appearing lytic or blastic lesions noted in the visualized portions of the skeleton. IMPRESSION: 1. Massive dilatation of the small bowel, without definite transition point. Small volume of gas and stool in a relatively decompressed colon. Findings are favored to reflect small bowel ileus, which has been evident on numerous  prior abdominal studies. The possibility of partial or early small-bowel obstruction is not entirely excluded, but not strongly favored at this time. 2. Small amount of gas non dependently in the urinary bladder, favored to be iatrogenic. In the absence of recent catheterization, correlation with urinalysis would be strongly recommended to exclude the possibility of urinary tract infection with gas-forming organisms. 3. Multiple nonobstructive calculi within the left renal collecting system and lying dependently in the lumen of the urinary bladder. No ureteral stones are noted at this time. Despite this, there is moderate right and severe left hydroureteronephrosis, which could be indicative of distal ureteral strictures, and is presumably exacerbated in the setting of a markedly distended urinary bladder. Urologic consultation is recommended. 4. Moderate right and small left pleural effusions lying dependently with some associated passive subsegmental atelectasis throughout the lower lobes of the lungs bilaterally. 5. Patulous fluid-filled esophagus which demonstrates mural thickening and hyper enhancement, which may simply reflect esophagitis, however, there is any clinical concern for Barrett's metaplasia or esophageal neoplasia, further evaluation with endoscopy could be considered. 6. Additional incidental findings, as above. Electronically Signed   By: Trudie Reed M.D.   On: 09/05/2018 12:20   Ct Abdomen Pelvis W Contrast  Result Date: 08/25/2018 CLINICAL DATA:  68 year old male with abdominal distension and pain and nausea. EXAM: CT ABDOMEN AND PELVIS WITH CONTRAST TECHNIQUE: Multidetector CT imaging of the abdomen and pelvis was performed using the standard protocol following bolus administration of intravenous contrast. CONTRAST:  OMNIPAQUE  IOHEXOL 300 MG/ML  SOLN COMPARISON:  CT of the abdomen pelvis dated 05/23/2018 FINDINGS: Lower chest: Bibasilar dependent atelectatic changes noted. There  is a 4 mm left lower lobe subpleural nodule. No definite intra-abdominal free air. No free fluid. Hepatobiliary: The liver is unremarkable. The gallbladder is decompressed around a percutaneous cholecystostomy tube. Pancreas: Unremarkable. No pancreatic ductal dilatation or surrounding inflammatory changes. Spleen: Normal in size without focal abnormality. Adrenals/Urinary Tract: The adrenal glands are unremarkable. Multiple large and staghorn left renal calculi noted measuring up to 17 mm in the interpolar aspect of the kidney. There is a 16 mm stone or cluster of stones in the left renal pelvis with partial obstruction of the UPJ and resulting mild left hydronephrosis. There is no hydronephrosis on the right. The visualized ureters appear unremarkable. There are several stones within the urinary bladder. There is diffuse trabeculation of the bladder wall likely related to chronic bladder outlet obstruction or chronic inflammation. Stomach/Bowel: The stomach is distended with air and ingested content. There is retained ingested material within the visualized esophagus which may represent reflux or delayed esophageal clearance and dysmotility. There is mild thickening of the gastroesophageal junction which may be related to chronic and recurrent reflux. There are multiple dilated loops of small bowel with air-fluid levels measuring up to 7.5 cm in diameter. There is a probable transition in the mid abdomen (series 2, image 60 and coronal series 5, image 51). There is large amount of stool throughout the colon. Vascular/Lymphatic: There is gas within mesenteric vessels (series 2, image 49 and 47 and 50) concerning for bowel ischemia. Clinical correlation and surgical consult is advised. No gas is noted within the portal veins. The abdominal aorta is unremarkable. The origins of the celiac axis, SMA, IMA are patent. The SMV, splenic vein, and main portal vein are patent. There is no adenopathy. Reproductive: Multiple  fiducial markers noted within the prostate gland. Other: None Musculoskeletal: Degenerative changes of the spine. No acute osseous pathology. IMPRESSION: 1. High-grade small bowel obstruction with pneumatosis of the mesenteric vessels concerning for bowel ischemia. Clinical correlation and surgical consult is advised. 2. Distended stomach with ingested content in the distal esophagus. 3. Multiple nonobstructing left renal calculi. A partially obstructing stone or string of adjacent stones in the left UPJ with associated mild left hydronephrosis. Multiple bladder calculi. 4. Percutaneous cholecystostomy tube with decompression of the gallbladder. These results were called by telephone at the time of interpretation on 08/25/2018 at 9:58 pm to Dr. Raeford Razor , who verbally acknowledged these results. Electronically Signed   By: Elgie Collard M.D.   On: 08/25/2018 22:00   Dg Small Bowel W Single Cm (sol Or Thin Ba)  Result Date: 09/02/2018 CLINICAL DATA:  Small bowel obstruction. EXAM: SMALL BOWEL SERIES COMPARISON:  Radiographs of Aug 27, 2018. TECHNIQUE: Following ingestion of water-soluble contrast, serial small bowel images were obtained. No spot images were obtained. 50 mL of water-soluble contrast was mixed with 50 mL of water and administered through pre-existing nasogastric tube. Diffuse small bowel dilatation is noted concerning for ileus or distal small bowel obstruction. No significant colonic dilatation is noted. The initial images demonstrate contrast remaining within the proximal stomach. The final image was obtained 16 hours after the administration of contrast. No contrast is visualized in the small or large bowel on this study. IMPRESSION: Diffuse small bowel dilatation is noted. The contrast administered through nasogastric tube with seen to pool within the stomach and not advance on the initial images. Delayed  images demonstrate no contrast present in the large or small bowel. These findings  are consistent with either ileus or distal small bowel obstruction. Electronically Signed   By: Lupita Raider M.D.   On: 09/02/2018 08:34   Dg Chest Portable 1 View  Result Date: 09/05/2018 CLINICAL DATA:  Status post nasogastric tube placement. EXAM: PORTABLE CHEST 1 VIEW COMPARISON:  Radiographs of same day. FINDINGS: Stable cardiomediastinal silhouette. Hypoinflation of the lungs is noted with mild bibasilar subsegmental atelectasis. No pneumothorax is noted. Distal tip of nasogastric tube is seen in proximal stomach. Bony thorax is unremarkable. IMPRESSION: Hypoinflation of the lungs is noted with mild bibasilar subsegmental atelectasis. Distal tip of nasogastric tube is seen in proximal stomach. Electronically Signed   By: Lupita Raider M.D.   On: 09/05/2018 13:23   Dg Chest Port 1 View  Result Date: 08/30/2018 CLINICAL DATA:  Bedside nasogastric tube placement. EXAM: PORTABLE CHEST 1 VIEW COMPARISON:  08/25/2018 and earlier. FINDINGS: Nasogastric tube looped in the stomach and its tip appears to be within the proximal gastric body. Small bowel distension in the visualized upper abdomen has improved since the examination 5 days ago. Cholecystostomy tube in the RIGHT UPPER QUADRANT again noted. Suboptimal inspiration with bibasilar atelectasis, unchanged. Lungs remain clear otherwise. Pulmonary vascularity normal. IMPRESSION: 1. Nasogastric tube looped in the stomach and its tip appears to be within the proximal gastric body. 2. Suboptimal inspiration with bibasilar atelectasis, unchanged. 3. No new pulmonary abnormalities. Electronically Signed   By: Hulan Saas M.D.   On: 08/30/2018 15:53   Dg Chest Port 1 View  Result Date: 08/25/2018 CLINICAL DATA:  Preoperative imaging EXAM: PORTABLE CHEST 1 VIEW COMPARISON:  None. FINDINGS: Shallow lung inflation with bibasilar atelectasis. No focal airspace consolidation. Cardiomediastinal contours are normal. IMPRESSION: No acute airspace disease.  Electronically Signed   By: Deatra Robinson M.D.   On: 08/25/2018 23:09   Dg Chest Port 1v Same Day  Result Date: 09/06/2018 CLINICAL DATA:  Nasogastric tube placement. EXAM: PORTABLE CHEST 1 VIEW COMPARISON:  Sep 06, 2018 study obtained earlier in the day FINDINGS: Nasogastric tube tip and side port are in the stomach, essentially unchanged from earlier in the day. There is a layering pleural effusion on the right. There is no edema or consolidation. There is mild cardiomegaly with pulmonary venous hypertension. No adenopathy. No bone lesions. IMPRESSION: Nasogastric tube tip and side port in the stomach with the tip directed superiorly in the proximal body of the stomach, unchanged from earlier in the day. They are in pleural effusion on the right. Pulmonary vascular congestion present without edema or consolidation. Electronically Signed   By: Bretta Bang III M.D.   On: 09/06/2018 07:58   Dg Abd Acute 2+v W 1v Chest  Result Date: 09/05/2018 CLINICAL DATA:  Bloating EXAM: DG ABDOMEN ACUTE W/ 1V CHEST COMPARISON:  Four days ago FINDINGS: Marked generalized gaseous distension of small and large bowel. Rectal stool without over distension. Right upper quadrant drain is again noted. Multiple left renal calcifications. IMPRESSION: Continued generalized bowel distension suggesting adynamic ileus. Electronically Signed   By: Marnee Spring M.D.   On: 09/05/2018 10:05   Dg Abd Portable 2v  Result Date: 08/27/2018 CLINICAL DATA:  Ileus. Recent exploratory laparotomy with reduction of small bowel volvulus. EXAM: PORTABLE ABDOMEN - 2 VIEW COMPARISON:  CT abdomen and pelvis 08/25/2018 FINDINGS: Intraperitoneal free air is consistent with recent surgery. A percutaneous cholecystostomy tube is again noted in the right upper abdomen.  There is prominent gaseous dilatation of multiple small bowel loops in the upper and mid abdomen. Stool is present in nondilated colon and rectum. There is also moderate gaseous  distension of the stomach. Skin staples and prostate fiducials are noted. The visualized lung bases are grossly clear within limitations of motion artifact. IMPRESSION: Prominent small bowel dilatation which may reflect postoperative ileus or recurrent obstruction. Electronically Signed   By: Sebastian Ache M.D.   On: 08/27/2018 17:17     Discharge Exam: Vitals:   09/08/18 2337 09/09/18 0709  BP: (!) 95/58 (!) 106/56  Pulse: 85 84  Resp: 17 17  Temp: 98.8 F (37.1 C) 98.6 F (37 C)  SpO2: 94% 96%   Vitals:   09/08/18 0838 09/08/18 1333 09/08/18 2337 09/09/18 0709  BP:  109/62 (!) 95/58 (!) 106/56  Pulse:  73 85 84  Resp:  Temp:  98 F (36.7 C) 98.8 F (37.1 C) 98.6 F (37 C)  TempSrc:  Oral Oral Oral  SpO2: 97% 100% 94% 96%    General: Pt is alert, awake, not in acute distress, patient is nonverbal Cardiovascular: RRR, S1/S2 +, no rubs, no gallops Respiratory: CTA bilaterally, no wheezing, no rhonchi Abdominal: Soft, NT, ND, bowel sounds + Extremities: no edema, no cyanosis    The results of significant diagnostics from this hospitalization (including imaging, microbiology, ancillary and laboratory) are listed below for reference.     Microbiology: Recent Results (from the past 240 hour(s))  SARS Coronavirus 2 (CEPHEID - Performed in [facility] hospital lab), Hosp Order     Status: None   Collection Time: 09/06/18 12:30 PM  Result Value Ref Range Status   SARS Coronavirus 2 NEGATIVE NEGATIVE Final    Comment: (NOTE) If result is NEGATIVE SARS-CoV-2 target nucleic acids are NOT DETECTED. The SARS-CoV-2 RNA is generally detectable in upper and lower  respiratory specimens during the acute phase of infection. The lowest  concentration of SARS-CoV-2 viral copies this assay can detect is 250  copies / mL. A negative result does not preclude SARS-CoV-2 infection  and should not be used as the sole basis for treatment or other  patient management decisions.  A  negative result may occur with  improper specimen collection / handling, submission of specimen other  than nasopharyngeal swab, presence of viral mutation(s) within the  areas targeted by this assay, and inadequate number of viral copies  (<250 copies / mL). A negative result must be combined with clinical  observations, patient history, and epidemiological information. If result is POSITIVE SARS-CoV-2 target nucleic acids are DETECTED. The SARS-CoV-2 RNA is generally detectable in upper and lower  respiratory specimens dur ing the acute phase of infection.  Positive  results are indicative of active infection with SARS-CoV-2.  Clinical  correlation with patient history and other diagnostic information is  necessary to determine patient infection status.  Positive results do  not rule out bacterial infection or co-infection with other viruses. If result is PRESUMPTIVE POSTIVE SARS-CoV-2 nucleic acids MAY BE PRESENT.   A presumptive positive result was obtained on the submitted specimen  and confirmed on repeat testing.  While 2019 novel coronavirus  (SARS-CoV-2) nucleic acids may be present in the submitted sample  additional confirmatory testing may be necessary for epidemiological  and / or clinical management purposes  to differentiate between  SARS-CoV-2 and other Sarbecovirus currently known to infect humans.  If clinically indicated additional testing with an alternate test  methodology [phone])  is advised. The SARS-CoV-2 RNA is generally  detectable in upper and lower respiratory sp ecimens during the acute  phase of infection. The expected result is Negative. Fact Sheet for Patients:  BoilerBrush.com.cy Fact Sheet for Healthcare Providers: https://pope.com/ This test is not yet approved or cleared by the [place] FDA and has been authorized for detection and/or diagnosis of SARS-CoV-2 by FDA under an Emergency Use  Authorization (EUA).  This EUA will remain in effect (meaning this test can be used) for the duration of the COVID-19 declaration under Section 564(b)(1) of the Act, 21 U.S.C. section 360bbb-3(b)(1), unless the authorization is terminated or revoked sooner. Performed at [facility], [address] [place], [place] [address]      Labs: BNP (last 3 results) No results for input(s): BNP in the last 8760 hours. Basic Metabolic Panel: Recent Labs  Lab 09/03/18 0452 09/04/18 0641 09/05/18 1019 09/06/18 0638 09/07/18 0432 09/08/18 0416 09/09/18 0801  NA 142 140 138 141 145 140 138  K 3.1* 3.6 3.5 3.4* 3.7 3.2* 3.3*  CL 110 105 101 102 103 102 102  CO2 29 27 26 28 30 28 28   GLUCOSE 130* 144* 152* 117* 114* 165* 149*  BUN 14 8 11 13 17 15  7*  CREATININE 0.98 0.93 1.08 1.43* 1.27* 1.11 0.89  CALCIUM 7.9* 8.6* 9.3 8.6* 8.5* 8.1* 8.1*  MG 2.0 1.9  --   --  1.7 1.8 1.6*  PHOS 2.2* 2.7  --   --   --   --   --    Liver Function Tests: Recent Labs  Lab 09/05/18 1019  AST 12*  ALT 9  ALKPHOS 52  BILITOT 0.5  PROT 7.1  ALBUMIN 4.1   Recent Labs  Lab 09/05/18 1019  LIPASE 40   No results for input(s): AMMONIA in the last 168 hours. CBC: Recent Labs  Lab 09/05/18 1019 09/06/18 0638 09/07/18 0432 09/08/18 0416 09/09/18 0801  WBC 11.6* 10.6* 11.8* 10.9* 6.1  NEUTROABS 9.7*  --  9.8* 8.4*  --   HGB 11.9* 10.6* 9.1* 8.7* 8.9*  HCT 37.9* 32.9* 29.1* 27.6* 29.0*  MCV 90.7 91.1 91.5 91.7 92.4  PLT 303 286 235 225 254   Cardiac Enzymes: No results for input(s): CKTOTAL, CKMB, CKMBINDEX, TROPONINI in the last 168 hours. BNP: Invalid input(s): POCBNP CBG: Recent Labs  Lab 09/08/18 0724 09/08/18 1126 09/08/18 1630 09/08/18 2335 09/09/18 0735  GLUCAP 145* 148* 119* 174* 139*   D-Dimer No results for input(s): DDIMER in the last 72 hours. Hgb A1c No results for input(s): HGBA1C in the last 72 hours. Lipid Profile No results for input(s): CHOL, HDL, LDLCALC, TRIG,  CHOLHDL, LDLDIRECT in the last 72 hours. Thyroid function studies No results for input(s): TSH, T4TOTAL, T3FREE, THYROIDAB in the last 72 hours.  Invalid input(s): FREET3 Anemia work up No results for input(s): VITAMINB12, FOLATE, FERRITIN, TIBC, IRON, RETICCTPCT in the last 72 hours. Urinalysis    Component Value Date/Time   COLORURINE AMBER (A) 09/06/2018 1050   APPEARANCEUR TURBID (A) 09/06/2018 1050   LABSPEC 1.019 09/06/2018 1050   PHURINE 6.0 09/06/2018 1050   GLUCOSEU NEGATIVE 09/06/2018 1050   HGBUR MODERATE (A) 09/06/2018 1050   BILIRUBINUR NEGATIVE 09/06/2018 1050   KETONESUR NEGATIVE 09/06/2018 1050   PROTEINUR 100 (A) 09/06/2018 1050   UROBILINOGEN 1.0 07/20/2014 1718   NITRITE NEGATIVE 09/06/2018 1050   LEUKOCYTESUR LARGE (A) 09/06/2018 1050   Sepsis Labs Invalid input(s): PROCALCITONIN,  WBC,  LACTICIDVEN Microbiology Recent  Results (from the past 240 hour(s))  SARS Coronavirus 2 (CEPHEID - Performed in [facility] lab), Hosp Order     Status: None   Collection Time: 09/06/18 12:30 PM  Result Value Ref Range Status   SARS Coronavirus 2 NEGATIVE NEGATIVE Final    Comment: (NOTE) If result is NEGATIVE SARS-CoV-2 target nucleic acids are NOT DETECTED. The SARS-CoV-2 RNA is generally detectable in upper and lower  respiratory specimens during the acute phase of infection. The lowest  concentration of SARS-CoV-2 viral copies this assay can detect is 250  copies / mL. A negative result does not preclude SARS-CoV-2 infection  and should not be used as the sole basis for treatment or other  patient management decisions.  A negative result may occur with  improper specimen collection / handling, submission of specimen other  than nasopharyngeal swab, presence of viral mutation(s) within the  areas targeted by this assay, and inadequate number of viral copies  (<250 copies / mL). A negative result must be combined with clinical  observations, patient history,  and epidemiological information. If result is POSITIVE SARS-CoV-2 target nucleic acids are DETECTED. The SARS-CoV-2 RNA is generally detectable in upper and lower  respiratory specimens dur ing the acute phase of infection.  Positive  results are indicative of active infection with SARS-CoV-2.  Clinical  correlation with patient history and other diagnostic information is  necessary to determine patient infection status.  Positive results do  not rule out bacterial infection or co-infection with other viruses. If result is PRESUMPTIVE POSTIVE SARS-CoV-2 nucleic acids MAY BE PRESENT.   A presumptive positive result was obtained on the submitted specimen  and confirmed on repeat testing.  While 2019 novel coronavirus  (SARS-CoV-2) nucleic acids may be present in the submitted sample  additional confirmatory testing may be necessary for epidemiological  and / or clinical management purposes  to differentiate between  SARS-CoV-2 and other Sarbecovirus currently known to infect humans.  If clinically indicated additional testing with an alternate test  methodology [phone]([id number]) is advised. The SARS-CoV-2 RNA is generally  detectable in upper and lower respiratory sp ecimens during the acute  phase of infection. The expected result is Negative. Fact Sheet for Patients:  BoilerBrush.com.cyhttps://www.fda.gov/media/136312/download Fact Sheet for Healthcare Providers: https://pope.com/https://www.fda.gov/media/136313/download This test is not yet approved or cleared by the [place] FDA and has been authorized for detection and/or diagnosis of SARS-CoV-2 by FDA under an Emergency Use Authorization (EUA).  This EUA will remain in effect (meaning this test can be used) for the duration of the COVID-19 declaration under Section 564(b)(1) of the Act, 21 U.S.C. section 360bbb-3(b)(1), unless the authorization is terminated or revoked sooner. Performed at [facility], [address] [place], [place] [address]      Time  coordinating discharge: 40 minutes  SIGNED:   Erick BlinksPratik D Dawson Hollman, DO [facility] 09/09/2018, 9:27 AM  If 7PM-7AM, please contact night-coverage [email or web address] Password [email or web address]

## 2018-09-09 NOTE — Discharge Instructions (Signed)
Bowel Obstruction °A bowel obstruction means that something is blocking the small or large bowel. The bowel is also called the intestine. It is the long tube that connects the stomach to the opening of the butt (anus). When something blocks the bowel, food and fluids cannot pass through like normal. This condition needs to be treated. Treatment depends on the cause of the problem and how bad the problem is. °What are the causes? °Common causes of this condition include: °· Scar tissue (adhesions) from past surgery or from high-energy X-rays (radiation). °· Recent surgery in the belly. This affects how food moves in the bowel. °· Some diseases, such as: °? Irritation of the lining of the digestive tract (Crohn's disease). °? Irritation of small pouches in the bowel (diverticulitis). °· Growths or tumors. °· A bulging organ (hernia). °· Twisting of the bowel (volvulus). °· A foreign body. °· Slipping of a part of the bowel into another part (intussusception). °What are the signs or symptoms? °Symptoms of this condition include: °· Pain in the belly. °· Feeling sick to your stomach (nauseous). °· Throwing up (vomiting). °· Bloating in the belly. °· Being unable to pass gas. °· Trouble pooping (constipation). °· Watery poop (diarrhea). °· A lot of belching. °How is this diagnosed? °This condition may be diagnosed based on: °· A physical exam. °· Medical history. °· Imaging tests, such as X-ray or CT scan. °· Blood tests. °· Urine tests. °How is this treated? °Treatment for this condition may include: °· Fluids and pain medicines that are given through an IV tube. Your doctor may tell you not to eat or drink if you feel sick to your stomach and are throwing up. °· Eating a clear liquid diet for a few days. °· Putting a small tube (nasogastric tube) into the stomach. This will help with pain, discomfort, and nausea by removing blocked air and fluids from the stomach. °· Surgery. This may be needed if other treatments do  not work. °Follow these instructions at home: °Medicines °· Take over-the-counter and prescription medicines only as told by your doctor. °· If you were prescribed an antibiotic medicine, take it as told by your doctor. Do not stop taking the antibiotic even if you start to feel better. °General instructions °· Follow your diet as told by your doctor. You may need to: °? Only drink clear liquids until you start to get better. °? Avoid solid foods. °· Return to your normal activities as told by your doctor. Ask your doctor what activities are safe for you. °· Do not sit for a long time without moving. Get up to take short walks every 1-2 hours. This is important. Ask for help if you feel weak or unsteady. °· Keep all follow-up visits as told by your doctor. This is important. °How is this prevented? °After having a bowel obstruction, you may be more likely to have another. You can do some things to stop it from happening again. °· If you have a long-term (chronic) disease, contact your doctor if you see changes or problems. °· Take steps to prevent or treat trouble pooping. Your doctor may ask that you: °? Drink enough fluid to keep your pee (urine) pale yellow. °? Take over-the-counter or prescription medicines. °? Eat foods that are high in fiber. These include beans, whole grains, and fresh fruits and vegetables. °? Limit foods that are high in fat and sugar. These include fried or sweet foods. °· Stay active. Ask your doctor which exercises are   safe for you.  Avoid stress.  Eat three small meals and three small snacks each day.  Work with a Psychologist, prison and probation services (dietitian) to make a meal plan that works for you.  Do not use any products that contain nicotine or tobacco, such as cigarettes and e-cigarettes. If you need help quitting, ask your doctor. Contact a doctor if:  You have a fever.  You have chills. Get help right away if:  You have pain or cramps that get worse.  You throw up blood.  You are  sick to your stomach.  You cannot stop throwing up.  You cannot drink fluids.  You feel mixed up (confused).  You feel very thirsty (dehydrated).  Your belly gets more bloated.  You feel weak or you pass out (faint). Summary  A bowel obstruction means that something is blocking the small or large bowel.  Treatment may include IV fluids and pain medicine. You may also have a clear liquid diet, a small tube in your stomach, or surgery.  Drink clear liquids and avoid solid foods until you get better. This information is not intended to replace advice given to you by your health care provider. Make sure you discuss any questions you have with your health care provider. Document Released: 05/10/2004 Document Revised: 08/14/2017 Document Reviewed: 08/14/2017 Elsevier Interactive Patient Education  2019 [employer] Inc.   Abdominal Pain, Adult  Many things can cause belly (abdominal) pain. Most times, belly pain is not dangerous. Many cases of belly pain can be watched and treated at home. Sometimes belly pain is serious, though. Your doctor will try to find the cause of your belly pain. Follow these instructions at home:  Take over-the-counter and prescription medicines only as told by your doctor. Do not take medicines that help you poop (laxatives) unless told to by your doctor.  Drink enough fluid to keep your pee (urine) clear or pale yellow.  Watch your belly pain for any changes.  Keep all follow-up visits as told by your doctor. This is important. Contact a doctor if:  Your belly pain changes or gets worse.  You are not hungry, or you lose weight without trying.  You are having trouble pooping (constipated) or have watery poop (diarrhea) for more than 2-3 days.  You have pain when you pee or poop.  Your belly pain wakes you up at night.  Your pain gets worse with meals, after eating, or with certain foods.  You are throwing up and cannot keep anything down.  You  have a fever. Get help right away if:  Your pain does not go away as soon as your doctor says it should.  You cannot stop throwing up.  Your pain is only in areas of your belly, such as the right side or the left lower part of the belly.  You have bloody or black poop, or poop that looks like tar.  You have very bad pain, cramping, or bloating in your belly.  You have signs of not having enough fluid or water in your body (dehydration), such as: ? Dark pee, very little pee, or no pee. ? Cracked lips. ? Dry mouth. ? Sunken eyes. ? Sleepiness. ? Weakness. This information is not intended to replace advice given to you by your health care provider. Make sure you discuss any questions you have with your health care provider. Document Released: 09/19/2007 Document Revised: 10/21/2015 Document Reviewed: 09/14/2015 Elsevier Interactive Patient Education  2019 [employer].

## 2018-09-09 NOTE — Plan of Care (Signed)

## 2018-09-09 NOTE — Progress Notes (Signed)
  Subjective: No complaints.  Objective: Vital signs in last 24 hours: Temp:  [98 F (36.7 C)-98.8 F (37.1 C)] 98.6 F (37 C) (05/26 0709) Pulse Rate:  [73-85] 84 (05/26 0709) Resp:  [17] 17 (05/26 0709) BP: (95-109)/(56-62) 106/56 (05/26 0709) SpO2:  [94 %-100 %] 96 % (05/26 0709) Last BM Date: 09/07/18  Intake/Output from previous day: 05/25 0701 - 05/26 0700 In: 1510 [P.O.:1510] Out: 2200 [Urine:2200] Intake/Output this shift: No intake/output data recorded.  General appearance: cooperative and no distress GI: Soft, bowel sounds active.  Incision healing well.  Lab Results:  Recent Labs    09/08/18 0416 09/09/18 0801  WBC 10.9* 6.1  HGB 8.7* 8.9*  HCT 27.6* 29.0*  PLT 225 254   BMET Recent Labs    09/08/18 0416 09/09/18 0801  NA 140 138  K 3.2* 3.3*  CL 102 102  CO2 28 28  GLUCOSE 165* 149*  BUN 15 7*  CREATININE 1.11 0.89  CALCIUM 8.1* 8.1*   PT/INR No results for input(s): LABPROT, INR in the last 72 hours.  Studies/Results: No results found.  Anti-infectives: Anti-infectives (From admission, onward)   None      Assessment/Plan: Impression: Postoperative ileus resolved. Plan: Follow-up with Dr. Henreitta Leber on 09/16/2018.  LOS: 4 days    Franky Macho 09/09/2018

## 2018-09-09 NOTE — Progress Notes (Signed)
Location:   [facility] Room Number: 155 P Place of Service:  SNF (31)   CODE STATUS: Full code  No Known Allergies  Chief Complaint  Patient presents with  . Hospitalization Follow-up    Hospital follow up    HPI:  He is a 68 year old man who has had 2 hospitalizations. The first from 08-25-18 through 09-04-18 for SBO due to volvulus status post reduction. He was discharged and returned back to the hospital on 09-05-18 through 09-09-18 for ileus. He was treated conservatively and improved. He continues to struggle with electrolyte balance and will need to be followed. He has poor nutritional status as well. He is here for short term rehab with his goal to return back home with family. There are no reports of uncontrolled pain; no changes in his appetite; no reports of agitation or anxiety there are re no reports of fevers present. He will continue t be followed for his chronic illnesses including: diabetes bph; anemia.   Past Medical History:  Diagnosis Date  . Diabetes mellitus without complication (HCC)   . History of gout   . History of kidney stones   . Hypertension   . Mentally challenged   . Urinary retention     Past Surgical History:  Procedure Laterality Date  . CYSTOSCOPY W/ URETERAL STENT PLACEMENT Bilateral 06/20/2016   Procedure: CYSTOSCOPY WITH RETROGRADE PYELOGRAM/URETERAL STENT PLACEMENT;  Surgeon: Malen GauzePatrick L McKenzie, MD;  Location: [facility];  Service: Urology;  Laterality: Bilateral;  . CYSTOSCOPY WITH RETROGRADE PYELOGRAM, URETEROSCOPY AND STENT PLACEMENT Bilateral 08/20/2016   Procedure: CYSTOSCOPY WITH BILATERAL RETROGRADE PYELOGRAM, BILATERAL URETEROSCOPY,  LASER LITHOTRIPSY OF RIGHT URETERAL CALCULI, STONE BASKET EXTRACTION LEFT URETERAL CALCULI  AND BILATERAL URETERAL STENT EXCHANGE;  Surgeon: Malen GauzeMcKenzie, Patrick L, MD;  Location: [facility];  Service: Urology;  Laterality: Bilateral;  . HOLMIUM LASER APPLICATION Bilateral 08/20/2016   Procedure:  HOLMIUM LASER APPLICATION;  Surgeon: Malen GauzeMcKenzie, Patrick L, MD;  Location: [facility];  Service: Urology;  Laterality: Bilateral;  . IR CATHETER TUBE CHANGE  07/04/2018  . IR EXCHANGE BILIARY DRAIN  06/09/2018  . IR PERC CHOLECYSTOSTOMY  05/24/2018  . LAPAROTOMY N/A 08/25/2018   Procedure: EXPLORATORY LAPAROTOMY reduction of volvulus;  Surgeon: Lucretia RoersBridges, Lindsay C, MD;  Location: [facility];  Service: General;  Laterality: N/A;    Social History   Socioeconomic History  . Marital status: Single    Spouse name: Not on file  . Number of children: Not on file  . Years of education: Not on file  . Highest education level: Not on file  Occupational History  . Not on file  Social Needs  . Financial resource strain: Not on file  . Food insecurity:    Worry: Not on file    Inability: Not on file  . Transportation needs:    Medical: Not on file    Non-medical: Not on file  Tobacco Use  . Smoking status: Never Smoker  . Smokeless tobacco: Never Used  Substance and Sexual Activity  . Alcohol use: No  . Drug use: No  . Sexual activity: Never    Birth control/protection: None  Lifestyle  . Physical activity:    Days per week: Not on file    Minutes per session: Not on file  . Stress: Not on file  Relationships  . Social connections:    Talks on phone: Not on file    Gets together: Not on file    Attends religious  service: Not on file    Active member of club or organization: Not on file    Attends meetings of clubs or organizations: Not on file    Relationship status: Not on file  . Intimate partner violence:    Fear of current or ex partner: Not on file    Emotionally abused: Not on file    Physically abused: Not on file    Forced sexual activity: Not on file  Other Topics Concern  . Not on file  Social History Narrative   Patient is delayed lives with family   Family History  Problem Relation Age of Onset  . Gallbladder disease Other       VITAL SIGNS BP (!) 106/56   Pulse 84    Temp 98.6 F (37 C)   Resp 17   Ht  (1.702 m)   Wt 171 lb 1 oz (77.6 kg)   SpO2 96%   BMI 26.79 kg/m   Outpatient Encounter Medications as of 09/09/2018  Medication Sig  . Acetaminophen (TYLENOL ARTHRITIS PAIN PO) Take 1 tablet by mouth every 6 (six) hours as needed (pain).  Marland Kitchen aspirin EC 81 MG tablet Take 81 mg by mouth every morning.   Melene Muller ON 09/10/2018] finasteride (PROSCAR) 5 MG tablet Take 1 tablet (5 mg total) by mouth daily for 30 days.  . Glucerna (GLUCERNA) LIQD Take 237 mLs by mouth 3 (three) times daily between meals.  . magnesium oxide (MAG-OX) 400 MG tablet Take 1 tablet (400 mg total) by mouth daily for 30 days.  . metFORMIN (GLUCOPHAGE) 500 MG tablet Take 1,000 mg by mouth every morning.  . NON FORMULARY Diet Type:  NAS, Con CHO  . phosphorus (K PHOS NEUTRAL) 155-852-130 MG tablet Take 1 tablet (250 mg total) by mouth daily.  . potassium chloride 20 MEQ TBCR Take 40 mEq by mouth daily for 30 days.  . simethicone (MYLICON) 40 MG/0.6ML drops Take 0.6 mLs (40 mg total) by mouth 4 (four) times daily.  . tamsulosin (FLOMAX) 0.4 MG CAPS capsule Take 1 capsule (0.4 mg total) by mouth every evening.      SIGNIFICANT DIAGNOSTIC EXAMS  TODAY;   08-25-18: ct of abdomen and pelvis:  1. High-grade small bowel obstruction with pneumatosis of the mesenteric vessels concerning for bowel ischemia. Clinical correlation and surgical consult is advised. 2. Distended stomach with ingested content in the distal esophagus. 3. Multiple nonobstructing left renal calculi. A partially obstructing stone or string of adjacent stones in the left UPJ with associated mild left hydronephrosis. Multiple bladder calculi. 4. Percutaneous cholecystostomy tube with decompression of the Gallbladder.  09-01-18: DG small bowel w single: Diffuse small bowel dilatation is noted. The contrast administered through nasogastric tube with seen to pool within the stomach and not advance on the initial  images. Delayed images demonstrate no contrast present in the large or small bowel. These findings are consistent with either ileus or distal small bowel obstruction.  09-05-18: ct of abdomen an pelvis:  1. Massive dilatation of the small bowel, without definite transition point. Small volume of gas and stool in a relatively decompressed colon. Findings are favored to reflect small bowel ileus, which has been evident on numerous prior abdominal studies. The possibility of partial or early small-bowel obstruction is not entirely excluded, but not strongly favored at this time. 2. Small amount of gas non dependently in the urinary bladder, favored to be iatrogenic. In the absence of recent catheterization, correlation with urinalysis would  be strongly recommended to exclude the possibility of urinary tract infection with gas-forming organisms. 3. Multiple nonobstructive calculi within the left renal collecting system and lying dependently in the lumen of the urinary bladder. No ureteral stones are noted at this time. Despite this, there is moderate right and severe left hydroureteronephrosis, which could be indicative of distal ureteral strictures, and is presumably exacerbated in the setting of a markedly distended urinary bladder. Urologic consultation is recommended. 4. Moderate right and small left pleural effusions lying dependently with some associated passive subsegmental atelectasis throughout the lower lobes of the lungs bilaterally. 5. Patulous fluid-filled esophagus which demonstrates mural thickening and hyper enhancement, which may simply reflect esophagitis, however, there is any clinical concern for Barrett'  metaplasia or esophageal neoplasia, further evaluation with endoscopy could be considered. 6. Additional incidental findings, as above.  LABS REVIEWED TODAY;  08-25-18: wbc 11.4; hgb 15.3; hct 49.4; mcv 93.0; plt 243; glucose 164; bun 30; creat 1.16; k+ 4.2; na++ 136; ca 8.6; liver  normal albumin 4.4  08-28-18: wbc 11.7; hgb 13.0; hct 40.9; mcv 91.1; plt 220; glucose 122; bun 34; creat 1.79; k+ 3.7; na++ 144; ca 9.0 phos 3.3 mag 2.0  08-31-18: glucose 121; bun 28; create 1.26; k+ 3.4; na++ 146; ca 8.4 phos 2.2; mag 1.9; pre-albumin 10.1 09-02-18: glucose 114; bun 23; creat 1.07; k+ 3.4; na++ 143; ca 8.1; liver normal albumin 2.4 phos 2.5 mag 1.8 09-05-18: wbc 11.6; hgb 11.9; hct 37.9; mcv 90.7 plt 303; glucose 152; bun 11; creat 1.08; k+ 3.5; na++ 138; ca 9.3 liver normal albumin 4.1  09-08-18: wbc 10.9; hgb 8.7; hct 91.7; plt 225; glucose 165; bun 15; creat 1.11; k+ 3.2; na++ 140; ca 8.1 mag 1.8  09-09-18: wbc 6.1; hgb 8.9; hct 29.0; mcv 92.4 plt 254 glucose 149; bun 7; creat 0.89; k+ 3.3; na++ 138; ca 8.1 mag 1.6   Review of Systems  Reason unable to perform ROS: nonverbal; intellectually challenged    Physical Exam Constitutional:      General: He is not in acute distress.    Appearance: He is well-developed. He is not diaphoretic.  Neck:     Musculoskeletal: Neck supple.     Thyroid: No thyromegaly.  Cardiovascular:     Rate and Rhythm: Normal rate and regular rhythm.     Pulses: Normal pulses.     Heart sounds: Normal heart sounds.  Pulmonary:     Effort: Pulmonary effort is normal. No respiratory distress.     Breath sounds: Normal breath sounds.  Abdominal:     General: Bowel sounds are normal. There is distension.     Palpations: Abdomen is soft.     Tenderness: There is abdominal tenderness.     Comments: Bili drain in place Abdominal binder on  Genitourinary:    Comments: Foley  Musculoskeletal:     Right lower leg: No edema.     Left lower leg: No edema.     Comments: Is able to move all extremities   Lymphadenopathy:     Cervical: No cervical adenopathy.  Skin:    General: Skin is warm and dry.     Comments: Abdominal incision line without signs of infection present.   Neurological:     Mental Status: He is alert.     Comments: Is aware  Has  abnormal facial and tongue movements       ASSESSMENT/ PLAN:  TODAY:   1. Diabetes mellitus type 2 noninsulin dependent: is stable will continue  metformin 1 gm daily asa daily  81 mg  2.  SBO (small bowel obstruction)/gangrenous cholecystitis/ volvulus: is status post bili drain and exploratory lap with reduction of volvulus: is presently stable: will continue tylenol 650 mg every 6 hours as needed and mylicon 40 mg four times daily   3. Benign prostatic hyperplasia with urinary obstruction: is stable will continue proscar 5 mg daily and flomax 0.4 mg daily   4. Hypokalemia/hypomagnesemia: is without change: will continue mag ox 400 mg daily k+ 40 meq daily and k phos neutral 155/852.130 mg daily   5. Protein calorie malnutrition severe: is stable albumin 2.4 pre-albumin 10.1; will continue supplements as directed and will monitor his status.   6. Acute blood loss anemia: is stable hgb 8.9 will monitor   7. Urinary retention: is stable has long term foley will monitor   On 09-15-18 will check bmp cbc mag    MD is aware of resident's narcotic use and is in agreement with current plan of care. We will attempt to wean resident as apropriate   Synthia Innocent NP [facility]  Contact [phone] Monday through Friday 8am- 5pm  After hours call [phone]

## 2018-09-09 NOTE — Plan of Care (Signed)
  Problem: Education: Goal: Knowledge of General Education information will improve Description Including pain rating scale, medication(s)/side effects and non-pharmacologic comfort measures 09/09/2018 1145 by Darreld Mclean, RN Outcome: Adequate for Discharge 09/09/2018 0934 by Darreld Mclean, RN Outcome: Adequate for Discharge   Problem: Health Behavior/Discharge Planning: Goal: Ability to manage health-related needs will improve 09/09/2018 1145 by Darreld Mclean, RN Outcome: Adequate for Discharge 09/09/2018 0934 by Darreld Mclean, RN Outcome: Adequate for Discharge   Problem: Clinical Measurements: Goal: Ability to maintain clinical measurements within normal limits will improve 09/09/2018 1145 by Darreld Mclean, RN Outcome: Adequate for Discharge 09/09/2018 0934 by Darreld Mclean, RN Outcome: Adequate for Discharge Goal: Will remain free from infection 09/09/2018 1145 by Darreld Mclean, RN Outcome: Adequate for Discharge 09/09/2018 0934 by Darreld Mclean, RN Outcome: Adequate for Discharge Goal: Diagnostic test results will improve 09/09/2018 1145 by Darreld Mclean, RN Outcome: Adequate for Discharge 09/09/2018 0934 by Darreld Mclean, RN Outcome: Adequate for Discharge Goal: Respiratory complications will improve 09/09/2018 1145 by Darreld Mclean, RN Outcome: Adequate for Discharge 09/09/2018 0934 by Darreld Mclean, RN Outcome: Adequate for Discharge Goal: Cardiovascular complication will be avoided 09/09/2018 1145 by Darreld Mclean, RN Outcome: Adequate for Discharge 09/09/2018 0934 by Darreld Mclean, RN Outcome: Adequate for Discharge   Problem: Activity: Goal: Risk for activity intolerance will decrease 09/09/2018 1145 by Darreld Mclean, RN Outcome: Adequate for Discharge 09/09/2018 0934 by Darreld Mclean, RN Outcome: Adequate for Discharge   Problem: Nutrition: Goal: Adequate nutrition will be maintained 09/09/2018 1145 by Darreld Mclean, RN Outcome: Adequate for Discharge 09/09/2018 0934 by Darreld Mclean, RN Outcome: Adequate for Discharge   Problem:  Coping: Goal: Level of anxiety will decrease 09/09/2018 1145 by Darreld Mclean, RN Outcome: Adequate for Discharge 09/09/2018 0934 by Darreld Mclean, RN Outcome: Adequate for Discharge   Problem: Elimination: Goal: Will not experience complications related to bowel motility 09/09/2018 1145 by Darreld Mclean, RN Outcome: Adequate for Discharge 09/09/2018 0934 by Darreld Mclean, RN Outcome: Adequate for Discharge Goal: Will not experience complications related to urinary retention 09/09/2018 1145 by Darreld Mclean, RN Outcome: Adequate for Discharge 09/09/2018 0934 by Darreld Mclean, RN Outcome: Adequate for Discharge   Problem: Pain Managment: Goal: General experience of comfort will improve 09/09/2018 1145 by Darreld Mclean, RN Outcome: Adequate for Discharge 09/09/2018 0934 by Darreld Mclean, RN Outcome: Adequate for Discharge   Problem: Safety: Goal: Ability to remain free from injury will improve 09/09/2018 1145 by Darreld Mclean, RN Outcome: Adequate for Discharge 09/09/2018 0934 by Darreld Mclean, RN Outcome: Adequate for Discharge   Problem: Skin Integrity: Goal: Risk for impaired skin integrity will decrease 09/09/2018 1145 by Darreld Mclean, RN Outcome: Adequate for Discharge 09/09/2018 0934 by Darreld Mclean, RN Outcome: Adequate for Discharge

## 2018-09-10 ENCOUNTER — Encounter: Payer: Self-pay | Admitting: Adult Health

## 2018-09-10 ENCOUNTER — Non-Acute Institutional Stay (SKILLED_NURSING_FACILITY): Payer: Medicare Other | Admitting: Adult Health

## 2018-09-10 DIAGNOSIS — K562 Volvulus: Secondary | ICD-10-CM | POA: Diagnosis not present

## 2018-09-10 DIAGNOSIS — K810 Acute cholecystitis: Secondary | ICD-10-CM | POA: Diagnosis not present

## 2018-09-10 DIAGNOSIS — R339 Retention of urine, unspecified: Secondary | ICD-10-CM | POA: Diagnosis not present

## 2018-09-10 NOTE — Progress Notes (Signed)
Location:   [name] Room Number: 155 P Place of Service:  SNF (31)   CODE STATUS: Full Code  No Known Allergies  Chief Complaint  Patient presents with  . Acute Visit    74 Hour Care Plan Meeting    HPI:  We have come together for his 72 hour care plan meeting. His goal is to return back home. He has 24 hours are at home with family. He has 6 steps to get into his house. He is ambulating with therapy. Is bathing; dressing with minimal assistance.  He has not taken a shower in years; they frighten him. He has a walker at home. He has long term urine retention. His family was I/O cath him at home. He has a foley here; and will discharge with it. There are no falls. There are no reports of uncontrolled pain; no changes in his appetite; no fevers. No reports of agitation present.    Past Medical History:  Diagnosis Date  . Diabetes mellitus without complication (HCC)   . History of gout   . History of kidney stones   . Hypertension   . Mentally challenged   . Urinary retention     Past Surgical History:  Procedure Laterality Date  . CYSTOSCOPY W/ URETERAL STENT PLACEMENT Bilateral 06/20/2016   Procedure: CYSTOSCOPY WITH RETROGRADE PYELOGRAM/URETERAL STENT PLACEMENT;  Surgeon: Malen Gauze, MD;  Location: [facility];  Service: Urology;  Laterality: Bilateral;  . CYSTOSCOPY WITH RETROGRADE PYELOGRAM, URETEROSCOPY AND STENT PLACEMENT Bilateral 08/20/2016   Procedure: CYSTOSCOPY WITH BILATERAL RETROGRADE PYELOGRAM, BILATERAL URETEROSCOPY,  LASER LITHOTRIPSY OF RIGHT URETERAL CALCULI, STONE BASKET EXTRACTION LEFT URETERAL CALCULI  AND BILATERAL URETERAL STENT EXCHANGE;  Surgeon: Malen Gauze, MD;  Location: [facility];  Service: Urology;  Laterality: Bilateral;  . HOLMIUM LASER APPLICATION Bilateral 08/20/2016   Procedure: HOLMIUM LASER APPLICATION;  Surgeon: Malen Gauze, MD;  Location: [facility];  Service: Urology;  Laterality: Bilateral;  . IR  CATHETER TUBE CHANGE  07/04/2018  . IR EXCHANGE BILIARY DRAIN  06/09/2018  . IR PERC CHOLECYSTOSTOMY  05/24/2018  . LAPAROTOMY N/A 08/25/2018   Procedure: EXPLORATORY LAPAROTOMY reduction of volvulus;  Surgeon: [name], MD;  Location: [facility];  Service: General;  Laterality: N/A;    Social History   Socioeconomic History  . Marital status: Single    Spouse name: Not on file  . Number of children: Not on file  . Years of education: Not on file  . Highest education level: Not on file  Occupational History  . Not on file  Social Needs  . Financial resource strain: Not on file  . Food insecurity:    Worry: Not on file    Inability: Not on file  . Transportation needs:    Medical: Not on file    Non-medical: Not on file  Tobacco Use  . Smoking status: Never Smoker  . Smokeless tobacco: Never Used  Substance and Sexual Activity  . Alcohol use: No  . Drug use: No  . Sexual activity: Never    Birth control/protection: None  Lifestyle  . Physical activity:    Days per week: Not on file    Minutes per session: Not on file  . Stress: Not on file  Relationships  . Social connections:    Talks on phone: Not on file    Gets together: Not on file    Attends religious service: Not on file    Active  member of club or organization: Not on file    Attends meetings of clubs or organizations: Not on file    Relationship status: Not on file  . Intimate partner violence:    Fear of current or ex partner: Not on file    Emotionally abused: Not on file    Physically abused: Not on file    Forced sexual activity: Not on file  Other Topics Concern  . Not on file  Social History Narrative   Patient is delayed lives with family   Family History  Problem Relation Age of Onset  . Gallbladder disease Other       VITAL SIGNS BP (!) 101/58   Pulse 79   Temp 98.2 F (36.8 C)   Resp 20   Ht 5\' 7"  (1.702 m)   Wt 163 lb 6.4 oz (74.1 kg)   BMI 25.59 kg/m   Outpatient Encounter  Medications as of 09/10/2018  Medication Sig  . Acetaminophen (TYLENOL ARTHRITIS PAIN PO) Take 1 tablet by mouth every 6 (six) hours as needed (pain).  Marland Kitchen. aspirin EC 81 MG tablet Take 81 mg by mouth every morning.   . finasteride (PROSCAR) 5 MG tablet Take 1 tablet (5 mg total) by mouth daily for 30 days.  . Glucerna (GLUCERNA) LIQD Take 237 mLs by mouth 3 (three) times daily between meals.  . magnesium oxide (MAG-OX) 400 MG tablet Take 1 tablet (400 mg total) by mouth daily for 30 days.  . metFORMIN (GLUCOPHAGE) 500 MG tablet Take 1,000 mg by mouth every morning.  . NON FORMULARY Diet Type:  Soft Diet, NAS, Cons CHO  . phosphorus (K PHOS NEUTRAL) 155-852-130 MG tablet Take 1 tablet (250 mg total) by mouth daily.  . potassium chloride 20 MEQ TBCR Take 40 mEq by mouth daily for 30 days.  . simethicone (MYLICON) 40 MG/0.6ML drops Take 0.6 mLs (40 mg total) by mouth 4 (four) times daily.  . tamsulosin (FLOMAX) 0.4 MG CAPS capsule Take 1 capsule (0.4 mg total) by mouth every evening.   No facility-administered encounter medications on file as of 09/10/2018.      SIGNIFICANT DIAGNOSTIC EXAMS  PREVIOUS;   08-25-18: ct of abdomen and pelvis:  1. High-grade small bowel obstruction with pneumatosis of the mesenteric vessels concerning for bowel ischemia. Clinical correlation and surgical consult is advised. 2. Distended stomach with ingested content in the distal esophagus. 3. Multiple nonobstructing left renal calculi. A partially obstructing stone or string of adjacent stones in the left UPJ with associated mild left hydronephrosis. Multiple bladder calculi. 4. Percutaneous cholecystostomy tube with decompression of the Gallbladder.  09-01-18: DG small bowel w single: Diffuse small bowel dilatation is noted. The contrast administered through nasogastric tube with seen to pool within the stomach and not advance on the initial images. Delayed images demonstrate no contrast present in the large or  small bowel. These findings are consistent with either ileus or distal small bowel obstruction.  09-05-18: ct of abdomen an pelvis:  1. Massive dilatation of the small bowel, without definite transition point. Small volume of gas and stool in a relatively decompressed colon. Findings are favored to reflect small bowel ileus, which has been evident on numerous prior abdominal studies. The possibility of partial or early small-bowel obstruction is not entirely excluded, but not strongly favored at this time. 2. Small amount of gas non dependently in the urinary bladder, favored to be iatrogenic. In the absence of recent catheterization, correlation with urinalysis would be strongly  recommended to exclude the possibility of urinary tract infection with gas-forming organisms. 3. Multiple nonobstructive calculi within the left renal collecting system and lying dependently in the lumen of the urinary bladder. No ureteral stones are noted at this time. Despite this, there is moderate right and severe left hydroureteronephrosis, which could be indicative of distal ureteral strictures, and is presumably exacerbated in the setting of a markedly distended urinary bladder. Urologic consultation is recommended. 4. Moderate right and small left pleural effusions lying dependently with some associated passive subsegmental atelectasis throughout the lower lobes of the lungs bilaterally. 5. Patulous fluid-filled esophagus which demonstrates mural thickening and hyper enhancement, which may simply reflect esophagitis, however, there is any clinical concern for Barrett'  metaplasia or esophageal neoplasia, further evaluation with endoscopy could be considered. 6. Additional incidental findings, as above.  NO NEW REPORTS.   LABS REVIEWED PREVIOUS;  08-25-18: wbc 11.4; hgb 15.3; hct 49.4; mcv 93.0; plt 243; glucose 164; bun 30; creat 1.16; k+ 4.2; na++ 136; ca 8.6; liver normal albumin 4.4  08-28-18: wbc 11.7; hgb 13.0;  hct 40.9; mcv 91.1; plt 220; glucose 122; bun 34; creat 1.79; k+ 3.7; na++ 144; ca 9.0 phos 3.3 mag 2.0  08-31-18: glucose 121; bun 28; create 1.26; k+ 3.4; na++ 146; ca 8.4 phos 2.2; mag 1.9; pre-albumin 10.1 09-02-18: glucose 114; bun 23; creat 1.07; k+ 3.4; na++ 143; ca 8.1; liver normal albumin 2.4 phos 2.5 mag 1.8 09-05-18: wbc 11.6; hgb 11.9; hct 37.9; mcv 90.7 plt 303; glucose 152; bun 11; creat 1.08; k+ 3.5; na++ 138; ca 9.3 liver normal albumin 4.1  09-08-18: wbc 10.9; hgb 8.7; hct 91.7; plt 225; glucose 165; bun 15; creat 1.11; k+ 3.2; na++ 140; ca 8.1 mag 1.8  09-09-18: wbc 6.1; hgb 8.9; hct 29.0; mcv 92.4 plt 254 glucose 149; bun 7; creat 0.89; k+ 3.3; na++ 138; ca 8.1 mag 1.6   NO NEW LABS.    Review of Systems  Reason unable to perform ROS: nonverbal intellectually challenged     Physical Exam Constitutional:      General: He is not in acute distress.    Appearance: He is well-developed. He is not diaphoretic.  Neck:     Musculoskeletal: Neck supple.     Thyroid: No thyromegaly.  Cardiovascular:     Rate and Rhythm: Normal rate and regular rhythm.     Pulses: Normal pulses.     Heart sounds: Normal heart sounds.  Pulmonary:     Effort: Pulmonary effort is normal. No respiratory distress.     Breath sounds: Normal breath sounds.  Abdominal:     General: Bowel sounds are normal. There is distension.     Palpations: Abdomen is soft.     Tenderness: There is abdominal tenderness.     Comments: Bili drain in place Abdominal binder on  Mild tenderness to palpation   Genitourinary:    Comments: Foley  Musculoskeletal:     Right lower leg: No edema.     Left lower leg: No edema.     Comments: Is able to move all extremities   Lymphadenopathy:     Cervical: No cervical adenopathy.  Skin:    General: Skin is warm and dry.  Neurological:     Mental Status: He is alert. Mental status is at baseline.  Psychiatric:        Mood and Affect: Mood normal.     ASSESSMENT/  PLAN:  TODAY:   1. Volvulus 2. Gangrenous cholecystitis 3.  Urinary retention  Will continue his current plan of care Will continue therapy as directed Will continue current medications Will monitor his status.        MD is aware of resident's narcotic use and is in agreement with current plan of care. We will attempt to wean resident as apropriate   Synthia Innocent NP [facility]  Contact [phone] Monday through Friday 8am- 5pm  After hours call [phone]

## 2018-09-11 ENCOUNTER — Encounter: Payer: Self-pay | Admitting: Internal Medicine

## 2018-09-11 ENCOUNTER — Non-Acute Institutional Stay (SKILLED_NURSING_FACILITY): Payer: Medicare Other | Admitting: Internal Medicine

## 2018-09-11 DIAGNOSIS — N138 Other obstructive and reflux uropathy: Secondary | ICD-10-CM

## 2018-09-11 DIAGNOSIS — K56609 Unspecified intestinal obstruction, unspecified as to partial versus complete obstruction: Secondary | ICD-10-CM

## 2018-09-11 DIAGNOSIS — N401 Enlarged prostate with lower urinary tract symptoms: Secondary | ICD-10-CM

## 2018-09-11 DIAGNOSIS — E119 Type 2 diabetes mellitus without complications: Secondary | ICD-10-CM | POA: Diagnosis not present

## 2018-09-11 DIAGNOSIS — K810 Acute cholecystitis: Secondary | ICD-10-CM | POA: Diagnosis not present

## 2018-09-11 DIAGNOSIS — K567 Ileus, unspecified: Secondary | ICD-10-CM

## 2018-09-11 NOTE — Progress Notes (Signed)
Provider:  Einar Crow, MD Location:  [name] Room Number: 155 P Place of Service:  SNF (31)  PCP: Pearson Grippe, MD Patient Care Team: Pearson Grippe, MD as PCP - General (Internal Medicine)  Extended Emergency Contact Information Primary Emergency Contact: Long,[name] Address: [address]          [place], [place] [address] [place] of [place] Home Phone: [phone] Mobile Phone: [phone] Relation: Aunt  Code Status: Full Code Goals of Care: Advanced Directive information Advanced Directives 09/11/2018  Does Patient Have a Medical Advance Directive? No  Type of Advance Directive -  Does patient want to make changes to medical advance directive? No - Patient declined  Copy of Healthcare Power of Attorney in Chart? -  Would patient like information on creating a medical advance directive? No - Patient declined      Chief Complaint  Patient presents with  . New Admit To SNF    Admission    HPI: Patient is a 68 y.o. male seen today for admission to SNF for therapy Patient was in the hospital from 5/11-5/21 for SBO and then again was readmitted from 5/22-5/26 for Ileus. Patient has PMH of intellectual disability, type 2 diabetes, BPH. He was also admitted in February of this year for acute gangrenous cholecystitis.  And now has a percutaneous drain in place. He was initially admitted with nausea vomiting.  His CT scan of abdomen showed high-grade small bowel obstruction with concern for ischemia.  He had laparotomy on 5/12.  He was found to have a volvulus.  That was reduced.  Postop patient developed ileus and required bowel rest and IV fluids.  He was eventually sent to SNF. Patient started developing nausea vomiting abdominal pain and distention again and was sent to the hospital.  At this time he has been NG tube placed again.  He was managed conservatively.  His potassium and magnesium were supplemented.  Initially improved and started eating and  moving his bowels. Patient also has a history of bladder outlet obstruction.-His family does in and out cath.  But a chronic Foley was placed Patient is unable to give any history.  He is aphasic would nod his head for  few things.  But per nurses he is eating really well and his bowels are moving.  He did deny any abdominal pain..   Past Medical History:  Diagnosis Date  . Diabetes mellitus without complication (HCC)   . History of gout   . History of kidney stones   . Hypertension   . Mentally challenged   . Urinary retention    Past Surgical History:  Procedure Laterality Date  . CYSTOSCOPY W/ URETERAL STENT PLACEMENT Bilateral 06/20/2016   Procedure: CYSTOSCOPY WITH RETROGRADE PYELOGRAM/URETERAL STENT PLACEMENT;  Surgeon: Malen Gauze, MD;  Location: [facility];  Service: Urology;  Laterality: Bilateral;  . CYSTOSCOPY WITH RETROGRADE PYELOGRAM, URETEROSCOPY AND STENT PLACEMENT Bilateral 08/20/2016   Procedure: CYSTOSCOPY WITH BILATERAL RETROGRADE PYELOGRAM, BILATERAL URETEROSCOPY,  LASER LITHOTRIPSY OF RIGHT URETERAL CALCULI, STONE BASKET EXTRACTION LEFT URETERAL CALCULI  AND BILATERAL URETERAL STENT EXCHANGE;  Surgeon: Malen Gauze, MD;  Location: [facility];  Service: Urology;  Laterality: Bilateral;  . HOLMIUM LASER APPLICATION Bilateral 08/20/2016   Procedure: HOLMIUM LASER APPLICATION;  Surgeon: Malen Gauze, MD;  Location: [facility];  Service: Urology;  Laterality: Bilateral;  . IR CATHETER TUBE CHANGE  07/04/2018  . IR EXCHANGE BILIARY DRAIN  06/09/2018  . IR PERC CHOLECYSTOSTOMY  05/24/2018  .  LAPAROTOMY N/A 08/25/2018   Procedure: EXPLORATORY LAPAROTOMY reduction of volvulus;  Surgeon: [name], MD;  Location: [facility];  Service: General;  Laterality: N/A;    reports that he has never smoked. He has never used smokeless tobacco. He reports that he does not drink alcohol or use drugs. Social History   Socioeconomic History  . Marital status: Single    Spouse name:  Not on file  . Number of children: Not on file  . Years of education: Not on file  . Highest education level: Not on file  Occupational History  . Not on file  Social Needs  . Financial resource strain: Not on file  . Food insecurity:    Worry: Not on file    Inability: Not on file  . Transportation needs:    Medical: Not on file    Non-medical: Not on file  Tobacco Use  . Smoking status: Never Smoker  . Smokeless tobacco: Never Used  Substance and Sexual Activity  . Alcohol use: No  . Drug use: No  . Sexual activity: Never    Birth control/protection: None  Lifestyle  . Physical activity:    Days per week: Not on file    Minutes per session: Not on file  . Stress: Not on file  Relationships  . Social connections:    Talks on phone: Not on file    Gets together: Not on file    Attends religious service: Not on file    Active member of club or organization: Not on file    Attends meetings of clubs or organizations: Not on file    Relationship status: Not on file  . Intimate partner violence:    Fear of current or ex partner: Not on file    Emotionally abused: Not on file    Physically abused: Not on file    Forced sexual activity: Not on file  Other Topics Concern  . Not on file  Social History Narrative   Patient is delayed lives with family    Functional Status Survey:    Family History  Problem Relation Age of Onset  . Gallbladder disease Other     Health Maintenance  Topic Date Due  . OPHTHALMOLOGY EXAM  10/10/2018 (Originally 10/02/1960)  . URINE MICROALBUMIN  10/10/2018 (Originally 10/02/1960)  . COLONOSCOPY  10/10/2018 (Originally 10/02/1999)  . FOOT EXAM  10/13/2018 (Originally 10/02/1960)  . TETANUS/TDAP  10/13/2018 (Originally 10/01/1968)  . Hepatitis C Screening  10/13/2018 (Originally 1950-05-10)  . HEMOGLOBIN A1C  10/09/2018  . INFLUENZA VACCINE  11/15/2018  . PNA vac Low Risk Adult (2 of 2 - PCV13) 05/27/2019    No Known Allergies   Outpatient Encounter Medications as of 09/11/2018  Medication Sig  . Acetaminophen (TYLENOL ARTHRITIS PAIN PO) Take 1 tablet by mouth every 6 (six) hours as needed (pain).  Marland Kitchen aspirin EC 81 MG tablet Take 81 mg by mouth every morning.   Lucilla Lame [place]-Castor Oil (VENELEX) OINT Apply topically to sacrum and bilateral buttocks every shift and as needed for blanchable erythema and prevention  . finasteride (PROSCAR) 5 MG tablet Take 1 tablet (5 mg total) by mouth daily for 30 days.  . Glucerna (GLUCERNA) LIQD Take 237 mLs by mouth 3 (three) times daily between meals.  . magnesium oxide (MAG-OX) 400 MG tablet Take 1 tablet (400 mg total) by mouth daily for 30 days.  . metFORMIN (GLUCOPHAGE) 500 MG tablet Take 1,000 mg by mouth every morning.  Marland Kitchen  NON FORMULARY Diet Type:  Soft Diet, NAS, Cons CHO  . phosphorus (K PHOS NEUTRAL) 155-852-130 MG tablet Take 1 tablet (250 mg total) by mouth daily.  . potassium chloride 20 MEQ TBCR Take 40 mEq by mouth daily for 30 days.  . simethicone (MYLICON) 40 MG/0.6ML drops Take 0.6 mLs (40 mg total) by mouth 4 (four) times daily.  . tamsulosin (FLOMAX) 0.4 MG CAPS capsule Take 1 capsule (0.4 mg total) by mouth every evening.   No facility-administered encounter medications on file as of 09/11/2018.     Review of Systems  Unable to perform ROS: Patient nonverbal    Vitals:   09/11/18 0952  BP: 110/60  Pulse: 66  Resp: 20  Temp: (!) 97 F (36.1 C)  Weight: 163 lb 6.4 oz (74.1 kg)  Height:  (1.702 m)   Body mass index is 25.59 kg/m. Physical Exam Vitals signs reviewed.  Constitutional:      Appearance: Normal appearance.  HENT:     Head: Normocephalic.     Nose: Nose normal.     Mouth/Throat:     Mouth: Mucous membranes are moist.     Pharynx: Oropharynx is clear.  Eyes:     Pupils: Pupils are equal, round, and reactive to light.  Neck:     Musculoskeletal: Neck supple.  Cardiovascular:     Rate and Rhythm: Normal rate and regular rhythm.      Pulses: Normal pulses.     Heart sounds: Normal heart sounds. No murmur.  Pulmonary:     Effort: Pulmonary effort is normal. No respiratory distress.     Breath sounds: Normal breath sounds. No wheezing.  Abdominal:     General: There is distension.     Palpations: Abdomen is soft.     Tenderness: There is no abdominal tenderness.     Comments: BS decreased  Musculoskeletal:        General: No swelling.  Skin:    General: Skin is warm and dry.  Neurological:     General: No focal deficit present.     Mental Status: He is alert.     Comments: Patient does not talk. Follows few commands. Looks comfortable  Psychiatric:        Mood and Affect: Mood normal.        Thought Content: Thought content normal.     Labs reviewed: Basic Metabolic Panel: Recent Labs    09/02/18 0515 09/03/18 0452 09/04/18 0641  09/07/18 0432 09/08/18 0416 09/09/18 0801  NA 143 142 140   < > 145 140 138  K 3.4* 3.1* 3.6   < > 3.7 3.2* 3.3*  CL 108 110 105   < > 103 102 102  CO2 < > GLUCOSE 114* 130* 144*   < > 114* 165* 149*  BUN < > 17 15 7*  CREATININE 1.07 0.98 0.93   < > 1.27* 1.11 0.89  CALCIUM 8.1* 7.9* 8.6*   < > 8.5* 8.1* 8.1*  MG 1.8 2.0 1.9  --  1.7 1.8 1.6*  PHOS 2.5 2.2* 2.7  --   --   --   --    < > = values in this interval not displayed.   Liver Function Tests: Recent Labs    08/27/18 0515 09/02/18 0515 09/05/18 1019  AST 17 13* 12*  ALT ALKPHOS 55 50 52  BILITOT 0.5  0.8 0.5  PROT 6.9 5.4* 7.1  ALBUMIN 3.0* 2.4* 4.1   Recent Labs    08/25/18 2020 09/05/18 1019  LIPASE 30 40   No results for input(s): AMMONIA in the last 8760 hours. CBC: Recent Labs    09/05/18 1019  09/07/18 0432 09/08/18 0416 09/09/18 0801  WBC 11.6*   < > 11.8* 10.9* 6.1  NEUTROABS 9.7*  --  9.8* 8.4*  --   HGB 11.9*   < > 9.1* 8.7* 8.9*  HCT 37.9*   < > 29.1* 27.6* 29.0*  MCV 90.7   < > 91.5 91.7 92.4  PLT 303   < > 235 225 254   < > =  values in this interval not displayed.   Cardiac Enzymes: Recent Labs    05/24/18 0403 05/24/18 1035 05/24/18 1639  TROPONINI 0.04* 0.03* 0.03*   BNP: Invalid input(s): POCBNP Lab Results  Component Value Date   HGBA1C 8.0 (H) 04/09/2018   Lab Results  Component Value Date   TSH 1.008 06/19/2016   No results found for: VITAMINB12 No results found for: FOLATE No results found for: IRON, TIBC, FERRITIN  Imaging and Procedures obtained prior to SNF admission: No results found.  Assessment/Plan SBO (small bowel obstruction) / Volvulus S/P Reduction with Exlap Patient eating and Moving his Bowels Follow up with Surgery for removal of his staples Ileus  Resolved at this time Does have Mild distension On Simethicone  Acute Renal Insuff Creat back to Normal Repeat BMP  Diabetes mellitus type 2,  On Metformin  Gangrenous cholecystitis Has Drain  Benign prostatic hyperplasia with urinary obstruction Contineu With Chronic Foley Also on Proscar Anemia Cannot do Iron due to h/o Ileus right now Will repeat CBC in few days Hypokalemia and Hypomagnesia On supplement Will continue to monitor Deconditioning Patient doing well with Therapy Plan to discharge Home   Family/ staff Communication:   Labs/tests ordered: Total time spent in this patient care encounter was  45_  minutes; greater than 50% of the visit spent counseling patient and staff, reviewing records , Labs and coordinating care for problems addressed at this encounter.

## 2018-09-15 ENCOUNTER — Encounter (HOSPITAL_COMMUNITY)
Admission: RE | Admit: 2018-09-15 | Discharge: 2018-09-15 | Disposition: A | Discharge status: Home / Self Care | Payer: Medicare Other | Source: Skilled Nursing Facility | Attending: Internal Medicine | Admitting: Internal Medicine

## 2018-09-15 LAB — CBC
HCT: 31.6 % — ABNORMAL LOW (ref 39.0–52.0)
Hemoglobin: 9.8 g/dL — ABNORMAL LOW (ref 13.0–17.0)
MCH: 28.6 pg (ref 26.0–34.0)
MCHC: 31 g/dL (ref 30.0–36.0)
MCV: 92.1 fL (ref 80.0–100.0)
Platelets: 327 10*3/uL (ref 150–400)
RBC: 3.43 MIL/uL — ABNORMAL LOW (ref 4.22–5.81)
RDW: 13.5 % (ref 11.5–15.5)
WBC: 5.7 10*3/uL (ref 4.0–10.5)
nRBC: 0 % (ref 0.0–0.2)

## 2018-09-15 LAB — BASIC METABOLIC PANEL
Anion gap: 10 (ref 5–15)
BUN: 14 mg/dL (ref 8–23)
CO2: 27 mmol/L (ref 22–32)
Calcium: 9.3 mg/dL (ref 8.9–10.3)
Chloride: 99 mmol/L (ref 98–111)
Creatinine, Ser: 1 mg/dL (ref 0.61–1.24)
GFR calc Af Amer: >60 mL/min (ref >60)
GFR calc non Af Amer: >60 mL/min (ref >60)
Glucose, Bld: 148 mg/dL — ABNORMAL HIGH (ref 70–99)
Potassium: 4.5 mmol/L (ref 3.5–5.1)
Sodium: 136 mmol/L (ref 135–145)

## 2018-09-15 LAB — MAGNESIUM: Magnesium: 1.9 mg/dL (ref 1.7–2.4)

## 2018-09-16 ENCOUNTER — Ambulatory Visit (INDEPENDENT_AMBULATORY_CARE_PROVIDER_SITE_OTHER): Payer: Self-pay | Admitting: General Surgery

## 2018-09-16 ENCOUNTER — Encounter: Payer: Self-pay | Admitting: General Surgery

## 2018-09-16 ENCOUNTER — Encounter (HOSPITAL_COMMUNITY)
Admission: RE | Admit: 2018-09-16 | Discharge: 2018-09-16 | Disposition: A | Discharge status: Home / Self Care | Payer: Medicare Other | Source: Skilled Nursing Facility | Attending: *Deleted | Admitting: *Deleted

## 2018-09-16 VITALS — BP 109/68 | HR 76 | Temp 97.3°F | Resp 16

## 2018-09-16 DIAGNOSIS — K6389 Other specified diseases of intestine: Secondary | ICD-10-CM

## 2018-09-16 DIAGNOSIS — K567 Ileus, unspecified: Secondary | ICD-10-CM

## 2018-09-16 DIAGNOSIS — K562 Volvulus: Secondary | ICD-10-CM

## 2018-09-16 LAB — SARS CORONAVIRUS 2 BY RT PCR (HOSPITAL ORDER, PERFORMED IN ~~LOC~~ HOSPITAL LAB): SARS Coronavirus 2: NEGATIVE

## 2018-09-16 MED ORDER — SULFAMETHOXAZOLE-TRIMETHOPRIM 800-160 MG PO TABS: 1.0000 | ORAL_TABLET | Freq: Two times a day (BID) | ORAL | 0 refills | Status: DC

## 2018-09-16 NOTE — Progress Notes (Addendum)
[facility] Note   HPI:  68 y.o. Male presents to clinic for post-op follow-up evaluation after exploration for pneumatosis with findings of a volvulus from his small bowel being so dilated that it was looping onto itself and kinking at the ileum. He had a prolonged ileus post op and then was readmitted with a UTI and repeat Ileus.  He likely has some degree of dysmotility at baseline, and I discussed the case with Dr. Karilyn Cota in the hospital and we recommended simethicone, getting the patient up to the toilet twice a day to evacuate air or stool.   Overall the patient is doing fair. His guardian reports his distention is about at his baseline.  She reports that he can be distended and eat and still have liquid stools.   Review of Systems:  No fever or chills reported Reported to be eating and moving bowels by RN at [facility] to family and in Dr. Chales Abrahams notes.  All other review of systems: otherwise negative   Vital Signs:  BP 109/68 (BP Location: Left Arm, Patient Position: Sitting, Cuff Size: Normal)   Pulse 76   Temp (!) 97.3 F (36.3 C) (Temporal)   Resp 16   SpO2 98%    Physical Exam:  Physical Exam Vitals signs reviewed.  Cardiovascular:     Rate and Rhythm: Normal rate.  Pulmonary:     Effort: Pulmonary effort is normal.  Abdominal:     General: There is distension.     Palpations: Abdomen is soft.     Tenderness: There is no abdominal tenderness.     Comments: Staples removed, small pus pockets at the staple sites, pus evacuated, mild erythema, steri strips placed   Neurological:     Mental Status: He is alert.      Assessment:  68 y.o. yo Male s/p Ex lap, reduction of volvulus and prolonged ileus after surgery. Readmission for ileus related to UTI and retention. Doing better now and at the [facility] getting therapies. He likely has some degree of dysmotility at baseline, and I have discussed this with Dr. Karilyn Cota while patient was in the hospital.   Staple line with some erythema and small pus pockets at the staple site.   Plan:  Steristrips in place, these will fall off in the next few days. Can shower per routine.  Bactrim ordered for 7 days due to redness around the staples.  Diet as tolerated. Would get up to commode twice daily to help with evacuation of gas and stool as patient tolerates.  Would continue the simethicone. Some degree of dysmotility at baseline. If continues to have issues, will need to see Dr. Karilyn Cota as an outpatient to discuss medication options. Will hold on referral for now.  Patient with cholecystostomy tube, continue drain care for now. Will see back in 6-8 weeks to further discuss options for cholecystostomy tube with guardian.   Future Appointments  Date Time Provider Department Center  11/04/2018 10:00 AM [name], MD [facility] None    All of the above recommendations were discussed with the patient and family, and all of family's questions were answered to her expressed satisfaction.  Algis Greenhouse, MD [facility] [address] [place], [place] [address] [phone] (office)

## 2018-09-16 NOTE — Patient Instructions (Addendum)
Steristrips in place, these will fall off in the next few days. Can shower per routine.  Bactrim ordered for 7 days due to redness around the staples.  Diet as tolerated. Would get up to commode twice daily to help with evacuation of gas and stool as patient tolerates.  Would continue the simethicone. Some degree of dysmotility at baseline. If continues to have issues, will need to see Dr. Karilyn Cota as an outpatient to discuss medication options.  Will hold on referral for now.  Patient with cholecystostomy tube, continue drain care for now. Will see back in 6 weeks to further discuss options for cholecystostomy tube with guardian.

## 2018-09-17 ENCOUNTER — Other Ambulatory Visit: Payer: Self-pay

## 2018-09-17 ENCOUNTER — Encounter (HOSPITAL_COMMUNITY): Payer: Self-pay | Admitting: Emergency Medicine

## 2018-09-17 DIAGNOSIS — Z7984 Long term (current) use of oral hypoglycemic drugs: Secondary | ICD-10-CM

## 2018-09-17 DIAGNOSIS — N390 Urinary tract infection, site not specified: Secondary | ICD-10-CM | POA: Diagnosis present

## 2018-09-17 DIAGNOSIS — K567 Ileus, unspecified: Secondary | ICD-10-CM | POA: Diagnosis present

## 2018-09-17 DIAGNOSIS — Z7982 Long term (current) use of aspirin: Secondary | ICD-10-CM

## 2018-09-17 DIAGNOSIS — K598 Other specified functional intestinal disorders: Principal | ICD-10-CM | POA: Diagnosis present

## 2018-09-17 DIAGNOSIS — Z8719 Personal history of other diseases of the digestive system: Secondary | ICD-10-CM

## 2018-09-17 DIAGNOSIS — Z9049 Acquired absence of other specified parts of digestive tract: Secondary | ICD-10-CM

## 2018-09-17 DIAGNOSIS — R2689 Other abnormalities of gait and mobility: Secondary | ICD-10-CM | POA: Diagnosis present

## 2018-09-17 DIAGNOSIS — Z1159 Encounter for screening for other viral diseases: Secondary | ICD-10-CM

## 2018-09-17 DIAGNOSIS — N400 Enlarged prostate without lower urinary tract symptoms: Secondary | ICD-10-CM | POA: Diagnosis present

## 2018-09-17 DIAGNOSIS — R2681 Unsteadiness on feet: Secondary | ICD-10-CM | POA: Diagnosis present

## 2018-09-17 DIAGNOSIS — E871 Hypo-osmolality and hyponatremia: Secondary | ICD-10-CM | POA: Diagnosis present

## 2018-09-17 DIAGNOSIS — Z87440 Personal history of urinary (tract) infections: Secondary | ICD-10-CM

## 2018-09-17 DIAGNOSIS — Z79899 Other long term (current) drug therapy: Secondary | ICD-10-CM

## 2018-09-17 DIAGNOSIS — D649 Anemia, unspecified: Secondary | ICD-10-CM | POA: Diagnosis present

## 2018-09-17 DIAGNOSIS — E119 Type 2 diabetes mellitus without complications: Secondary | ICD-10-CM | POA: Diagnosis present

## 2018-09-17 DIAGNOSIS — I10 Essential (primary) hypertension: Secondary | ICD-10-CM | POA: Diagnosis present

## 2018-09-17 DIAGNOSIS — F79 Unspecified intellectual disabilities: Secondary | ICD-10-CM | POA: Diagnosis present

## 2018-09-17 DIAGNOSIS — Z87442 Personal history of urinary calculi: Secondary | ICD-10-CM

## 2018-09-17 NOTE — ED Triage Notes (Signed)
Pt sent over to rule out bowel obstruction.

## 2018-09-18 ENCOUNTER — Encounter (HOSPITAL_COMMUNITY): Payer: Self-pay | Admitting: Internal Medicine

## 2018-09-18 ENCOUNTER — Observation Stay (HOSPITAL_COMMUNITY): Payer: Medicare Other

## 2018-09-18 ENCOUNTER — Inpatient Hospital Stay (HOSPITAL_COMMUNITY)
Admission: EM | Admit: 2018-09-18 | Discharge: 2018-09-20 | DRG: 392 | Disposition: A | Discharge status: Skilled Nursing Facility | Payer: Medicare Other | Source: Skilled Nursing Facility | Attending: Internal Medicine | Admitting: Internal Medicine

## 2018-09-18 ENCOUNTER — Emergency Department (HOSPITAL_COMMUNITY): Payer: Medicare Other

## 2018-09-18 DIAGNOSIS — Z7984 Long term (current) use of oral hypoglycemic drugs: Secondary | ICD-10-CM | POA: Diagnosis not present

## 2018-09-18 DIAGNOSIS — K567 Ileus, unspecified: Secondary | ICD-10-CM | POA: Diagnosis present

## 2018-09-18 DIAGNOSIS — E871 Hypo-osmolality and hyponatremia: Secondary | ICD-10-CM

## 2018-09-18 DIAGNOSIS — D649 Anemia, unspecified: Secondary | ICD-10-CM | POA: Diagnosis present

## 2018-09-18 DIAGNOSIS — R140 Abdominal distension (gaseous): Secondary | ICD-10-CM

## 2018-09-18 DIAGNOSIS — R2681 Unsteadiness on feet: Secondary | ICD-10-CM | POA: Diagnosis present

## 2018-09-18 DIAGNOSIS — K598 Other specified functional intestinal disorders: Secondary | ICD-10-CM | POA: Diagnosis present

## 2018-09-18 DIAGNOSIS — I10 Essential (primary) hypertension: Secondary | ICD-10-CM | POA: Diagnosis present

## 2018-09-18 DIAGNOSIS — E119 Type 2 diabetes mellitus without complications: Secondary | ICD-10-CM | POA: Diagnosis present

## 2018-09-18 DIAGNOSIS — Z8719 Personal history of other diseases of the digestive system: Secondary | ICD-10-CM | POA: Diagnosis not present

## 2018-09-18 DIAGNOSIS — Z87442 Personal history of urinary calculi: Secondary | ICD-10-CM | POA: Diagnosis not present

## 2018-09-18 DIAGNOSIS — R2689 Other abnormalities of gait and mobility: Secondary | ICD-10-CM | POA: Diagnosis present

## 2018-09-18 DIAGNOSIS — N390 Urinary tract infection, site not specified: Secondary | ICD-10-CM

## 2018-09-18 DIAGNOSIS — Z9049 Acquired absence of other specified parts of digestive tract: Secondary | ICD-10-CM | POA: Diagnosis not present

## 2018-09-18 DIAGNOSIS — N400 Enlarged prostate without lower urinary tract symptoms: Secondary | ICD-10-CM | POA: Diagnosis present

## 2018-09-18 DIAGNOSIS — Z1159 Encounter for screening for other viral diseases: Secondary | ICD-10-CM | POA: Diagnosis not present

## 2018-09-18 DIAGNOSIS — F79 Unspecified intellectual disabilities: Secondary | ICD-10-CM | POA: Diagnosis present

## 2018-09-18 DIAGNOSIS — Z7982 Long term (current) use of aspirin: Secondary | ICD-10-CM | POA: Diagnosis not present

## 2018-09-18 DIAGNOSIS — Z79899 Other long term (current) drug therapy: Secondary | ICD-10-CM | POA: Diagnosis not present

## 2018-09-18 DIAGNOSIS — Z87440 Personal history of urinary (tract) infections: Secondary | ICD-10-CM | POA: Diagnosis not present

## 2018-09-18 LAB — CBC WITH DIFFERENTIAL/PLATELET
Abs Immature Granulocytes: 0.01 10*3/uL (ref 0.00–0.07)
Basophils Absolute: 0 10*3/uL (ref 0.0–0.1)
Basophils Relative: 0 %
Eosinophils Absolute: 0 10*3/uL (ref 0.0–0.5)
Eosinophils Relative: 0 %
HCT: 38.2 % — ABNORMAL LOW (ref 39.0–52.0)
Hemoglobin: 12 g/dL — ABNORMAL LOW (ref 13.0–17.0)
Immature Granulocytes: 0 %
Lymphocytes Relative: 14 %
Lymphs Abs: 1.3 10*3/uL (ref 0.7–4.0)
MCH: 28.2 pg (ref 26.0–34.0)
MCHC: 31.4 g/dL (ref 30.0–36.0)
MCV: 89.7 fL (ref 80.0–100.0)
Monocytes Absolute: 0.4 10*3/uL (ref 0.1–1.0)
Monocytes Relative: 5 %
Neutro Abs: 7.4 10*3/uL (ref 1.7–7.7)
Neutrophils Relative %: 81 %
Platelets: 356 10*3/uL (ref 150–400)
RBC: 4.26 MIL/uL (ref 4.22–5.81)
RDW: 13.6 % (ref 11.5–15.5)
WBC: 9.2 10*3/uL (ref 4.0–10.5)
nRBC: 0 % (ref 0.0–0.2)

## 2018-09-18 LAB — URINALYSIS, ROUTINE W REFLEX MICROSCOPIC
Bilirubin Urine: NEGATIVE
Glucose, UA: NEGATIVE mg/dL
Ketones, ur: NEGATIVE mg/dL
Nitrite: NEGATIVE
Protein, ur: 100 mg/dL — AB
RBC / HPF: >50 RBC/hpf — ABNORMAL HIGH (ref 0–5)
Specific Gravity, Urine: 1.016 (ref 1.005–1.030)
WBC, UA: >50 WBC/hpf — ABNORMAL HIGH (ref 0–5)
pH: 6 (ref 5.0–8.0)

## 2018-09-18 LAB — SARS CORONAVIRUS 2 BY RT PCR (HOSPITAL ORDER, PERFORMED IN ~~LOC~~ HOSPITAL LAB): SARS Coronavirus 2: NEGATIVE

## 2018-09-18 LAB — COMPREHENSIVE METABOLIC PANEL
ALT: 24 U/L (ref 0–44)
AST: 22 U/L (ref 15–41)
Albumin: 4 g/dL (ref 3.5–5.0)
Alkaline Phosphatase: 75 U/L (ref 38–126)
Anion gap: 13 (ref 5–15)
BUN: 21 mg/dL (ref 8–23)
CO2: 26 mmol/L (ref 22–32)
Calcium: 9.9 mg/dL (ref 8.9–10.3)
Chloride: 93 mmol/L — ABNORMAL LOW (ref 98–111)
Creatinine, Ser: 1.21 mg/dL (ref 0.61–1.24)
GFR calc Af Amer: >60 mL/min (ref >60)
GFR calc non Af Amer: >60 mL/min (ref >60)
Glucose, Bld: 214 mg/dL — ABNORMAL HIGH (ref 70–99)
Potassium: 4.9 mmol/L (ref 3.5–5.1)
Sodium: 132 mmol/L — ABNORMAL LOW (ref 135–145)
Total Bilirubin: 0.3 mg/dL (ref 0.3–1.2)
Total Protein: 8.6 g/dL — ABNORMAL HIGH (ref 6.5–8.1)

## 2018-09-18 LAB — LIPASE, BLOOD: Lipase: 35 U/L (ref 11–51)

## 2018-09-18 LAB — PHOSPHORUS: Phosphorus: 4 mg/dL (ref 2.5–4.6)

## 2018-09-18 LAB — MAGNESIUM: Magnesium: 2 mg/dL (ref 1.7–2.4)

## 2018-09-18 LAB — LACTIC ACID, PLASMA: Lactic Acid, Venous: 1.4 mmol/L (ref 0.5–1.9)

## 2018-09-18 MED ORDER — K PHOS MONO-SOD PHOS DI & MONO 155-852-130 MG PO TABS
250.0000 mg | ORAL_TABLET | Freq: Every day | ORAL | Status: DC
  Administered 2018-09-18 – 2018-09-20 (×3): 250 mg via ORAL
  Filled 2018-09-18 (×3): qty 1

## 2018-09-18 MED ORDER — POTASSIUM CHLORIDE CRYS ER 20 MEQ PO TBCR
40.0000 meq | EXTENDED_RELEASE_TABLET | Freq: Every day | ORAL | Status: DC
  Administered 2018-09-18: 40 meq via ORAL
  Filled 2018-09-18 (×2): qty 2

## 2018-09-18 MED ORDER — ASPIRIN EC 81 MG PO TBEC
81.0000 mg | DELAYED_RELEASE_TABLET | Freq: Every morning | ORAL | Status: DC
  Administered 2018-09-19 – 2018-09-20 (×2): 81 mg via ORAL
  Filled 2018-09-18 (×3): qty 1

## 2018-09-18 MED ORDER — IOHEXOL 300 MG/ML  SOLN
100.0000 mL | Freq: Once | INTRAMUSCULAR | Status: AC | PRN
  Administered 2018-09-18: 04:00:00 100 mL via INTRAVENOUS

## 2018-09-18 MED ORDER — SODIUM CHLORIDE 0.9 % IV SOLN
INTRAVENOUS | Status: AC
  Administered 2018-09-18 (×2): via INTRAVENOUS

## 2018-09-18 MED ORDER — FINASTERIDE 5 MG PO TABS
5.0000 mg | ORAL_TABLET | Freq: Every day | ORAL | Status: DC
  Administered 2018-09-18 – 2018-09-20 (×3): 5 mg via ORAL
  Filled 2018-09-18 (×3): qty 1

## 2018-09-18 MED ORDER — MAGNESIUM OXIDE 400 (241.3 MG) MG PO TABS
400.0000 mg | ORAL_TABLET | Freq: Every day | ORAL | Status: DC
  Administered 2018-09-18 – 2018-09-20 (×3): 400 mg via ORAL
  Filled 2018-09-18 (×3): qty 1

## 2018-09-18 MED ORDER — METRONIDAZOLE 500 MG PO TABS
250.0000 mg | ORAL_TABLET | Freq: Three times a day (TID) | ORAL | Status: DC
  Administered 2018-09-18 – 2018-09-20 (×5): 250 mg via ORAL
  Filled 2018-09-18 (×5): qty 1

## 2018-09-18 MED ORDER — ACETAMINOPHEN 650 MG RE SUPP: 650.0000 mg | Freq: Four times a day (QID) | RECTAL | Status: DC | PRN

## 2018-09-18 MED ORDER — ACETAMINOPHEN 325 MG PO TABS: 650.0000 mg | ORAL_TABLET | Freq: Four times a day (QID) | ORAL | Status: DC | PRN

## 2018-09-18 MED ORDER — SIMETHICONE 40 MG/0.6ML PO SUSP
40.0000 mg | Freq: Four times a day (QID) | ORAL | Status: DC
  Administered 2018-09-18 – 2018-09-20 (×7): 40 mg via ORAL
  Filled 2018-09-18 (×3): qty 30

## 2018-09-18 MED ORDER — TAMSULOSIN HCL 0.4 MG PO CAPS
0.4000 mg | ORAL_CAPSULE | Freq: Every evening | ORAL | Status: DC
  Administered 2018-09-18 – 2018-09-19 (×2): 0.4 mg via ORAL
  Filled 2018-09-18 (×2): qty 1

## 2018-09-18 MED ORDER — BISACODYL 10 MG RE SUPP: 10.0000 mg | Freq: Once | RECTAL | Status: DC

## 2018-09-18 MED ORDER — ENOXAPARIN SODIUM 40 MG/0.4ML ~~LOC~~ SOLN
40.0000 mg | SUBCUTANEOUS | Status: DC
  Administered 2018-09-18 – 2018-09-20 (×3): 40 mg via SUBCUTANEOUS
  Filled 2018-09-18 (×4): qty 0.4

## 2018-09-18 NOTE — H&P (Signed)
TRH H&P    Patient Demographics:    [name], is a 68 y.o. male  MRN: [id number]  DOB - 17-Dec-1950  Admit Date - 09/18/2018  Referring MD/NP/PA:  Delora Fuel  Outpatient Primary MD for the patient is Jani Gravel, MD [facility]  Patient coming from:   SNF  Chief complaint- r/o obstruction   HPI:    [name]  is a 68 y.o. male,w intellectual disability, dm2, Bph, h/o acute gangrenous cholecystitis s/p percutaneous cholecystostomy 05/25/2018, w admission for SBO s/p exp lap w reduction of volvulus 08/26/2018, w admission for Ileus 09/05/2018 presents from SNF for evaluation of ? SBO.  Pt is a poor historian and unable to give any meaningful history.    Review of records shows that pt was seen by surgery on 09/16/2018 => slight erythema around staple line and started on Bactrim. Cont w cholecystostomy tube  In ED,  T 98.4  P 99 R 18  Bp 122/73  Pox 96% Wt 74kg  CT scan abd/ pelvis IMPRESSION: 1. Diffusely dilated and air-filled loops of small bowel similar to prior CT and may represent an ileus. A degree of small-bowel obstruction is not entirely excluded. Small-bowel series may provide better evaluation. Clinical correlation and follow-up recommended. 2. No free air or pneumatosis.  No portal venous gas. 3. Second frontal thickening of the distal esophagus may represent esophagitis although infiltrative mass is not excluded. Clinical correlation is recommended. 4. Percutaneous cholecystostomy with contracted gallbladder. 5. Nonobstructing left renal calculi. No hydronephrosis.  Na 132, K 4.9, Bun 21, Creatinine 1.21 Magnesium 2.0 Ast 22, Alt 24 alk phos 75 , T. Bili 0.3 Lipase 35 Glucose 214 Wbc 9.2, Hgb 12.0 (prev 9.8), plt 356 Lactic acid 1.4 Urinalysis wbc >50, rbc >50  Pt will be admitted for observation of abdominal distention, ? Ileus vs SBO, and acute UTI     Review of systems:    In addition to the HPI above,  Pt is unable to provide any meaningful history  No Fever-chills, No Headache, No changes with Vision or hearing, No problems swallowing food or Liquids, No Chest pain, Cough or Shortness of Breath, No Abdominal pain, No Nausea or Vomiting, bowel movements are regular, No Blood in stool or Urine, No dysuria, No new skin rashes or bruises, No new joints pains-aches,  No new weakness, tingling, numbness in any extremity, No recent weight gain or loss, No polyuria, polydypsia or polyphagia, No significant Mental Stressors.  All other systems reviewed and are negative.    Past History of the following :    Past Medical History:  Diagnosis Date   Diabetes mellitus without complication ([place])    History of gout    History of kidney stones    Hypertension    Mentally challenged    Urinary retention       Past Surgical History:  Procedure Laterality Date   CYSTOSCOPY W/ URETERAL STENT PLACEMENT Bilateral 06/20/2016   Procedure: CYSTOSCOPY WITH RETROGRADE PYELOGRAM/URETERAL STENT PLACEMENT;  Surgeon: Cleon Gustin, MD;  Location: [facility];  Service: Urology;  Laterality: Bilateral;   CYSTOSCOPY WITH RETROGRADE PYELOGRAM, URETEROSCOPY AND STENT PLACEMENT Bilateral 08/20/2016   Procedure: CYSTOSCOPY WITH BILATERAL RETROGRADE PYELOGRAM, BILATERAL URETEROSCOPY,  LASER LITHOTRIPSY OF RIGHT URETERAL CALCULI, STONE BASKET EXTRACTION LEFT URETERAL CALCULI  AND BILATERAL URETERAL STENT EXCHANGE;  Surgeon: Cleon Gustin, MD;  Location: [facility];  Service: Urology;  Laterality: Bilateral;   HOLMIUM LASER APPLICATION Bilateral 07/22/2705   Procedure: HOLMIUM LASER APPLICATION;  Surgeon: Cleon Gustin, MD;  Location: [facility];  Service: Urology;  Laterality: Bilateral;   IR CATHETER TUBE CHANGE  07/04/2018   IR EXCHANGE BILIARY DRAIN  06/09/2018   IR PERC CHOLECYSTOSTOMY  05/24/2018   LAPAROTOMY N/A 08/25/2018   Procedure:  EXPLORATORY LAPAROTOMY reduction of volvulus;  Surgeon: Virl Cagey, MD;  Location: [facility];  Service: General;  Laterality: N/A;      Social History:      Social History   Tobacco Use   Smoking status: Never Smoker   Smokeless tobacco: Never Used  Substance Use Topics   Alcohol use: No       Family History :     Family History  Problem Relation Age of Onset   Gallbladder disease Other        Home Medications:   Prior to Admission medications   Medication Sig Start Date End Date Taking? Authorizing Provider  Acetaminophen (TYLENOL ARTHRITIS PAIN PO) Take 1 tablet by mouth every 6 (six) hours as needed (pain).    [provider]  aspirin EC 81 MG tablet Take 81 mg by mouth every morning.     [provider]  Janne Lab Oil [facility]) OINT Apply topically to sacrum and bilateral buttocks every shift and as needed for blanchable erythema and prevention 09/10/18   [provider]  finasteride (PROSCAR) 5 MG tablet Take 1 tablet (5 mg total) by mouth daily for 30 days. 09/10/18 10/10/18  Manuella Ghazi, Pratik D, DO  Glucerna (GLUCERNA) LIQD Take 237 mLs by mouth 3 (three) times daily between meals. 09/04/18   [provider]  magnesium oxide (MAG-OX) 400 MG tablet Take 1 tablet (400 mg total) by mouth daily for 30 days. 09/09/18 10/09/18  Manuella Ghazi, Pratik D, DO  metFORMIN (GLUCOPHAGE) 500 MG tablet Take 1,000 mg by mouth every morning. 02/03/18   [provider]  NON FORMULARY Diet Type:  Soft Diet, NAS, Cons CHO 09/09/18   [provider]  phosphorus (K PHOS NEUTRAL) 155-852-130 MG tablet Take 1 tablet (250 mg total) by mouth daily. 09/04/18   Orson Eva, MD  potassium chloride 20 MEQ TBCR Take 40 mEq by mouth daily for 30 days. 09/09/18 10/09/18  Manuella Ghazi, Pratik D, DO  simethicone (MYLICON) 40 EM/7.5QG drops Take 0.6 mLs (40 mg total) by mouth 4 (four) times daily. 09/04/18   Orson Eva, MD  sulfamethoxazole-trimethoprim (BACTRIM DS)  800-160 MG tablet Take 1 tablet by mouth 2 (two) times daily. 09/16/18   Virl Cagey, MD  tamsulosin (FLOMAX) 0.4 MG CAPS capsule Take 1 capsule (0.4 mg total) by mouth every evening. 04/12/18   Kathie Dike, MD     Allergies:    No Known Allergies   Physical Exam:   Vitals  Blood pressure 122/73, pulse 99, temperature 98.4 F (36.9 C), resp. rate 18, height _0  (1.702 m), weight 74 kg, SpO2 96 %.  1.  General: axox1  2. Psychiatric: euthymic  3. Neurologic: Motor 5/5 in all 4 ext  4. HEENMT:  Anicteric, pupils 1.28m symmetric, direct, consensual intact  5. Respiratory : CTAB  6. Cardiovascular : rrr s1, s2  7. Gastrointestinal:  Abd: very distended, soft, nt, +bs slightly hyperactive, midline incision healed, steri strips in place  8. Skin:  Ext: no c/c/e,  No rash  9.Musculoskeletal:  Good ROM  No adenopathy    Data Review:    CBC Recent Labs  Lab 09/15/18 0700 09/18/18 0132  WBC 5.7 9.2  HGB 9.8* 12.0*  HCT 31.6* 38.2*  PLT 327 356  MCV 92.1 89.7  MCH 28.6 28.2  MCHC 31.0 31.4  RDW 13.5 13.6  LYMPHSABS  --  1.3  MONOABS  --  0.4  EOSABS  --  0.0  BASOSABS  --  0.0   ------------------------------------------------------------------------------------------------------------------  Results for orders placed or performed during the hospital encounter of 09/18/18 (from the past 48 hour(s))  Urinalysis, Routine w reflex microscopic     Status: Abnormal   Collection Time: 09/18/18  1:00 AM  Result Value Ref Range   Color, Urine YELLOW YELLOW   APPearance CLOUDY (A) CLEAR   Specific Gravity, Urine 1.016 1.005 - 1.030   pH 6.0 5.0 - 8.0   Glucose, UA NEGATIVE NEGATIVE mg/dL   Hgb urine dipstick LARGE (A) NEGATIVE   Bilirubin Urine NEGATIVE NEGATIVE   Ketones, ur NEGATIVE NEGATIVE mg/dL   Protein, ur 100 (A) NEGATIVE mg/dL   Nitrite NEGATIVE NEGATIVE   Leukocytes,Ua LARGE (A) NEGATIVE   RBC / HPF >50 (H) 0 - 5 RBC/hpf   WBC, UA  >50 (H) 0 - 5 WBC/hpf   Bacteria, UA MANY (A) NONE SEEN   Squamous Epithelial / LPF 0-5 0 - 5   WBC Clumps PRESENT     Comment: Performed at [facility] [address], [place] [address] Comprehensive metabolic panel     Status: Abnormal   Collection Time: 09/18/18  1:32 AM  Result Value Ref Range   Sodium 132 (L) 135 - 145 mmol/L   Potassium 4.9 3.5 - 5.1 mmol/L   Chloride 93 (L) 98 - 111 mmol/L   CO2 26 22 - 32 mmol/L   Glucose, Bld 214 (H) 70 - 99 mg/dL   BUN 21 8 - 23 mg/dL   Creatinine, Ser 1.21 0.61 - 1.24 mg/dL   Calcium 9.9 8.9 - 10.3 mg/dL   Total Protein 8.6 (H) 6.5 - 8.1 g/dL   Albumin 4.0 3.5 - 5.0 g/dL   AST 22 15 - 41 U/L   ALT 24 0 - 44 U/L   Alkaline Phosphatase 75 38 - 126 U/L   Total Bilirubin 0.3 0.3 - 1.2 mg/dL   GFR calc non Af Amer >60 >60 mL/min   GFR calc Af Amer >60 >60 mL/min   Anion gap 13 5 - 15    Comment: Performed at [facility] [address], [place] [id number] CBC with Differential     Status: Abnormal   Collection Time: 09/18/18  1:32 AM  Result Value Ref Range   WBC 9.2 4.0 - 10.5 K/uL   RBC 4.26 4.22 - 5.81 MIL/uL   Hemoglobin 12.0 (L) 13.0 - 17.0 g/dL   HCT 38.2 (L) 39.0 - 52.0 %   MCV 89.7 80.0 - 100.0 fL   MCH 28.2 26.0 - 34.0 pg   MCHC 31.4 30.0 - 36.0 g/dL   RDW 13.6 11.5 - 15.5 %   Platelets 356 150 - 400 K/uL   nRBC 0.0 0.0 -  0.2 %   Neutrophils Relative % 81 %   Neutro Abs 7.4 1.7 - 7.7 K/uL   Lymphocytes Relative 14 %   Lymphs Abs 1.3 0.7 - 4.0 K/uL   Monocytes Relative 5 %   Monocytes Absolute 0.4 0.1 - 1.0 K/uL   Eosinophils Relative 0 %   Eosinophils Absolute 0.0 0.0 - 0.5 K/uL   Basophils Relative 0 %   Basophils Absolute 0.0 0.0 - 0.1 K/uL   Immature Granulocytes 0 %   Abs Immature Granulocytes 0.01 0.00 - 0.07 K/uL    Comment: Performed at [facility], [address], [place], [place] [address]  Lactic acid, plasma     Status: None   Collection Time: 09/18/18  1:32 AM  Result Value Ref Range    Lactic Acid, Venous 1.4 0.5 - 1.9 mmol/L    Comment: Performed at [facility], [address] [place], [place] [address]  Lipase, blood     Status: None   Collection Time: 09/18/18  1:32 AM  Result Value Ref Range   Lipase 35 11 - 51 U/L    Comment: Performed at [facility], [address], [place], [place] [address]  Magnesium     Status: None   Collection Time: 09/18/18  1:32 AM  Result Value Ref Range   Magnesium 2.0 1.7 - 2.4 mg/dL    Comment: Performed at [facility], [address], [place], [place] [address]  SARS Coronavirus 2 (CEPHEID - Performed in [place] hospital lab), Hosp Order     Status: None   Collection Time: 09/18/18  2:02 AM  Result Value Ref Range   SARS Coronavirus 2 NEGATIVE NEGATIVE    Comment: (NOTE) If result is NEGATIVE SARS-CoV-2 target nucleic acids are NOT DETECTED. The SARS-CoV-2 RNA is generally detectable in upper and lower  respiratory specimens during the acute phase of infection. The lowest  concentration of SARS-CoV-2 viral copies this assay can detect is 250  copies / mL. A negative result does not preclude SARS-CoV-2 infection  and should not be used as the sole basis for treatment or other  patient management decisions.  A negative result may occur with  improper specimen collection / handling, submission of specimen other  than nasopharyngeal swab, presence of viral mutation(s) within the  areas targeted by this assay, and inadequate number of viral copies  (<250 copies / mL). A negative result must be combined with clinical  observations, patient history, and epidemiological information. If result is POSITIVE SARS-CoV-2 target nucleic acids are DETECTED. The SARS-CoV-2 RNA is generally detectable in upper and lower  respiratory specimens dur ing the acute phase of infection.  Positive  results are indicative of active infection with SARS-CoV-2.  Clinical  correlation with patient history and other diagnostic information is    necessary to determine patient infection status.  Positive results do  not rule out bacterial infection or co-infection with other viruses. If result is PRESUMPTIVE POSTIVE SARS-CoV-2 nucleic acids MAY BE PRESENT.   A presumptive positive result was obtained on the submitted specimen  and confirmed on repeat testing.  While 2019 novel coronavirus  (SARS-CoV-2) nucleic acids may be present in the submitted sample  additional confirmatory testing may be necessary for epidemiological  and / or clinical management purposes  to differentiate between  SARS-CoV-2 and other Sarbecovirus currently known to infect humans.  If clinically indicated additional testing with an alternate test  methodology [id number]) is advised. The SARS-CoV-2 RNA is generally  detectable in  upper and lower respiratory sp ecimens during the acute  phase of infection. The expected result is Negative. Fact Sheet for Patients:  StrictlyIdeas.no Fact Sheet for Healthcare Providers: BankingDealers.co.za This test is not yet approved or cleared by the [place] FDA and has been authorized for detection and/or diagnosis of SARS-CoV-2 by FDA under an Emergency Use Authorization (EUA).  This EUA will remain in effect (meaning this test can be used) for the duration of the COVID-19 declaration under Section 564(b)(1) of the Act, 21 U.S.C. section 360bbb-3(b)(1), unless the authorization is terminated or revoked sooner. Performed at [facility], [address], [place], [place] [address]     Chemistries  Recent Labs  Lab 09/15/18 0700 09/18/18 0132  NA 136 132*  K 4.5 4.9  CL 99 93*  CO2 27 26  GLUCOSE 148* 214*  BUN 14 21  CREATININE 1.00 1.21  CALCIUM 9.3 9.9  MG 1.9 2.0  AST  --  22  ALT  --  24  ALKPHOS  --  75  BILITOT  --  0.3    ------------------------------------------------------------------------------------------------------------------  ------------------------------------------------------------------------------------------------------------------ GFR: Estimated Creatinine Clearance: 54.6 mL/min (by C-G formula based on SCr of 1.21 mg/dL). Liver Function Tests: Recent Labs  Lab 09/18/18 0132  AST 22  ALT 24  ALKPHOS 75  BILITOT 0.3  PROT 8.6*  ALBUMIN 4.0   Recent Labs  Lab 09/18/18 0132  LIPASE 35   No results for input(s): AMMONIA in the last 168 hours. Coagulation Profile: No results for input(s): INR, PROTIME in the last 168 hours. Cardiac Enzymes: No results for input(s): CKTOTAL, CKMB, CKMBINDEX, TROPONINI in the last 168 hours. BNP (last 3 results) No results for input(s): PROBNP in the last 8760 hours. HbA1C: No results for input(s): HGBA1C in the last 72 hours. CBG: No results for input(s): GLUCAP in the last 168 hours. Lipid Profile: No results for input(s): CHOL, HDL, LDLCALC, TRIG, CHOLHDL, LDLDIRECT in the last 72 hours. Thyroid Function Tests: No results for input(s): TSH, T4TOTAL, FREET4, T3FREE, THYROIDAB in the last 72 hours. Anemia Panel: No results for input(s): VITAMINB12, FOLATE, FERRITIN, TIBC, IRON, RETICCTPCT in the last 72 hours.  --------------------------------------------------------------------------------------------------------------- Urine analysis:    Component Value Date/Time   COLORURINE YELLOW 09/18/2018 0100   APPEARANCEUR CLOUDY (A) 09/18/2018 0100   LABSPEC 1.016 09/18/2018 0100   PHURINE 6.0 09/18/2018 0100   GLUCOSEU NEGATIVE 09/18/2018 0100   HGBUR LARGE (A) 09/18/2018 0100   BILIRUBINUR NEGATIVE 09/18/2018 0100   KETONESUR NEGATIVE 09/18/2018 0100   PROTEINUR 100 (A) 09/18/2018 0100   UROBILINOGEN 1.0 07/20/2014 1718   NITRITE NEGATIVE 09/18/2018 0100   LEUKOCYTESUR LARGE (A) 09/18/2018 0100      Imaging Results:    Ct Abdomen  Pelvis W Contrast  Result Date: 09/18/2018 CLINICAL DATA:  68 year old male with abdominal pain and distention. Rule out bowel obstruction. EXAM: CT ABDOMEN AND PELVIS WITH CONTRAST TECHNIQUE: Multidetector CT imaging of the abdomen and pelvis was performed using the standard protocol following bolus administration of intravenous contrast. CONTRAST:  116m OMNIPAQUE IOHEXOL 300 MG/ML  SOLN COMPARISON:  Abdominal radiograph dated 09/06/2018 and CT dated 09/05/2018 FINDINGS: Lower chest: The visualized lung bases are clear. No intra-abdominal free air or free fluid. Hepatobiliary: The liver is unremarkable. No intrahepatic biliary ductal dilatation. The gallbladder is contracted around a percutaneous cholecystostomy. Pancreas: Unremarkable. No pancreatic ductal dilatation or surrounding inflammatory changes. Spleen: Normal in size without focal abnormality. Adrenals/Urinary Tract: The adrenal glands are unremarkable. Several nonobstructing left renal calculi  measure up to 15 mm in the interpolar left kidney. There is no hydronephrosis on either side. There is symmetric enhancement and excretion of contrast by both kidneys. The visualized ureters appear unremarkable. The urinary bladder is decompressed around a Foley catheter. Stomach/Bowel: There is diffuse circumferential thickening of the visualized distal esophagus which may represent esophagitis secondary to recurrent reflux. An infiltrative process is not entirely excluded. There is moderate to severe distention of the stomach with air and content. There is diffuse dilatation of air-filled loops of small bowel measuring up to 7.5 cm in diameter in the anterior abdomen. Findings are somewhat similar to prior CT and may represent an ileus. There are however multiple normal caliber loops of small bowel in the left lower abdomen and therefore a degree of obstruction is not entirely excluded. Small-bowel series may provide better evaluation to exclude a mechanical  obstruction. There is also diffuse air distention of the colon. There is no pneumatosis. The appendix is not visualized with certainty. Vascular/Lymphatic: The abdominal aorta and IVC appear unremarkable. No portal venous gas. There is no adenopathy. Reproductive: Multiple surgical clips in the region of the prostate gland. Other: None Musculoskeletal: Degenerative changes of the spine. No acute osseous pathology. IMPRESSION: 1. Diffusely dilated and air-filled loops of small bowel similar to prior CT and may represent an ileus. A degree of small-bowel obstruction is not entirely excluded. Small-bowel series may provide better evaluation. Clinical correlation and follow-up recommended. 2. No free air or pneumatosis.  No portal venous gas. 3. Second frontal thickening of the distal esophagus may represent esophagitis although infiltrative mass is not excluded. Clinical correlation is recommended. 4. Percutaneous cholecystostomy with contracted gallbladder. 5. Nonobstructing left renal calculi. No hydronephrosis. Electronically Signed   By: Anner Crete M.D.   On: 09/18/2018 04:08       Assessment & Plan:    Principal Problem:   Abdominal distension Active Problems:   HTN (hypertension)   Diabetes mellitus type 2, noninsulin dependent (HCC)   Hyponatremia   Acute lower UTI  Abdominal distention R/o ileus vs SBO NPO Small bowel series Surgery consult requested in computer  Acute UTI Urine culture Rocephin 1gm iv qday  Dm2 DC Metformin fsbs q4h, ISS  H/o Hypokalemia, Hypomagnesemia Cont Kcl, Kphos Cont Magnesium oxide 418m po qday  Hyponatremia Hydrate with ns gently Check cmp in am  Anemia Check cbc in am  Bph Cont Finasteride 585mpo qday Cont Flomax 0.22m30mo qhs  H/o gangrenous cholecystitis s/p percutaneous cholecystostomy monitor  DVT Prophylaxis-   Lovenox - SCDs   AM Labs Ordered, also please review Full Orders  Family Communication: Admission, patients  condition and plan of care including tests being ordered have been discussed with the patient  who indicate understanding and agree with the plan and Code Status.  Code Status:  FULL CODE  Admission status: Observation: Based on patients clinical presentation and evaluation of above clinical data, I have made determination that patient meets observation criteria at this time.  Time spent in minutes :  70   JamJani GravelD on 09/18/2018 at 5:44 AM

## 2018-09-18 NOTE — Consult Note (Signed)
Referring Provider: Maurilio Lovely, DO and Larae Grooms, MD Primary Care Physician:  Pearson Grippe, MD Primary Gastroenterologist:  Dr. Karilyn Cota  Reason for Consultation:    Abdominal distention.  HPI:   History is obtained from patient's chart, Dr. Henreitta Leber as well as from patient's niece Ms. [name] who is at bedside as well as from nursing staff at pain center.  According to patient's aunt Ms. Long patient's abdominal distention started about 2 years ago.  It was mild and he did not have any other symptoms.  But a year ago his distention became worse and he also began to have intermittent loose stools.  His condition did not lead to hospitalization or evaluation.  In February this year he developed gangrenous cholecystitis leading to cholecystostomy.  He still has cholecystostomy tube in place.  It has been changed once. He was admitted to this facility last month for progressive abdominal distention.  CT also revealed pneumatosis and was concerning for bowel ischemia.  He underwent laparotomy by Dr. Henreitta Leber on 08/25/2018.  He was found to have small bowel volvulus which was reduced and he did not require bowel resection.  He developed postop ileus and recovered gradually.  He was discharged on 09/04/2018 and readmitted a day later and discharged on 16-Sep-2020 [facility] for rehab. According to nursing staff and pain center patient has been having bowel movements.  He had a large bowel movement yesterday.  His oral intake has been about 50% in each meal.  His activity has been limited to bed and wheelchair.  He was brought to emergency room last night as the nursing staff felt his abdomen was more distended in the past and there was concern for small bowel obstruction. Patient was evaluated in emergency room and admitted to hospitalist service.  CT revealed diffusely dilated and air-filled loops of small bowel similar to prior CT no definite findings to suggest bowel obstruction.  There was no  pneumoperitoneum or pneumatosis.  Percutaneous cholecystostomy tube was still in contracted gallbladder and he also had nonobstructing stones in left kidney.  GI study was attempted with water-soluble contrast but it stays in the stomach.  Patient complains of pain in right lower quadrant.  He denies nausea or vomiting.  He would like to drink liquids.  He has not had a bowel movement today.  He is not sure if he has been passing gas.     Past Medical History:  Diagnosis Date  . Diabetes mellitus without complication (HCC)   . History of gout   . History of kidney stones   . Hypertension   . Mentally challenged   . Urinary retention     Past Surgical History:  Procedure Laterality Date  . CYSTOSCOPY W/ URETERAL STENT PLACEMENT Bilateral 06/20/2016   Procedure: CYSTOSCOPY WITH RETROGRADE PYELOGRAM/URETERAL STENT PLACEMENT;  Surgeon: Malen Gauze, MD;  Location: [facility];  Service: Urology;  Laterality: Bilateral;  . CYSTOSCOPY WITH RETROGRADE PYELOGRAM, URETEROSCOPY AND STENT PLACEMENT Bilateral 08/20/2016   Procedure: CYSTOSCOPY WITH BILATERAL RETROGRADE PYELOGRAM, BILATERAL URETEROSCOPY,  LASER LITHOTRIPSY OF RIGHT URETERAL CALCULI, STONE BASKET EXTRACTION LEFT URETERAL CALCULI  AND BILATERAL URETERAL STENT EXCHANGE;  Surgeon: Malen Gauze, MD;  Location: [facility];  Service: Urology;  Laterality: Bilateral;  . HOLMIUM LASER APPLICATION Bilateral 08/20/2016   Procedure: HOLMIUM LASER APPLICATION;  Surgeon: Malen Gauze, MD;  Location: [facility];  Service: Urology;  Laterality: Bilateral;  . IR CATHETER TUBE CHANGE  07/04/2018  . IR EXCHANGE BILIARY DRAIN  06/09/2018  . IR PERC CHOLECYSTOSTOMY  05/24/2018  . LAPAROTOMY N/A 08/25/2018   Procedure: EXPLORATORY LAPAROTOMY reduction of volvulus;  Surgeon: Lucretia RoersBridges, Lindsay C, MD;  Location: [facility];  Service: General;  Laterality: N/A;    Prior to Admission medications   Medication Sig Start Date End Date Taking? Authorizing Provider   Acetaminophen (TYLENOL ARTHRITIS PAIN PO) Take 1 tablet by mouth every 6 (six) hours as needed (pain).   Yes [provider]  aspirin EC 81 MG tablet Take 81 mg by mouth every morning.    Yes [provider]  Despina HiddenBalsam [place]-Castor Oil [facility](VENELEX) OINT Apply topically to sacrum and bilateral buttocks every shift and as needed for blanchable erythema and prevention 09/10/18  Yes [provider]  finasteride (PROSCAR) 5 MG tablet Take 1 tablet (5 mg total) by mouth daily for 30 days. 09/10/18 10/10/18 Yes Shah, Pratik D, DO  magnesium oxide (MAG-OX) 400 MG tablet Take 1 tablet (400 mg total) by mouth daily for 30 days. 09/09/18 10/09/18 Yes Shah, Pratik D, DO  metFORMIN (GLUCOPHAGE) 500 MG tablet Take 1,000 mg by mouth every morning. 02/03/18  Yes [provider]  phosphorus (K PHOS NEUTRAL) 155-852-130 MG tablet Take 1 tablet (250 mg total) by mouth daily. 09/04/18  Yes Tat, Onalee Huaavid, MD  potassium chloride 20 MEQ TBCR Take 40 mEq by mouth daily for 30 days. 09/09/18 10/09/18 Yes Shah, Pratik D, DO  Probiotic Product (RISA-BID PROBIOTIC PO) Take 1 capsule by mouth 2 (two) times a day.   Yes [provider]  simethicone (MYLICON) 40 MG/0.6ML drops Take 0.6 mLs (40 mg total) by mouth 4 (four) times daily. 09/04/18  Yes Tat, Onalee Huaavid, MD  sulfamethoxazole-trimethoprim (BACTRIM DS) 800-160 MG tablet Take 1 tablet by mouth 2 (two) times daily. 09/16/18  Yes Lucretia RoersBridges, Lindsay C, MD  tamsulosin (FLOMAX) 0.4 MG CAPS capsule Take 1 capsule (0.4 mg total) by mouth every evening. 04/12/18  Yes Erick BlinksMemon, Jehanzeb, MD    Current Facility-Administered Medications  Medication Dose Route Frequency Provider Last Rate Last Dose  . 0.9 %  sodium chloride infusion   Intravenous Continuous [name], [name], MD 75 mL/hr at 09/18/18 1548    . acetaminophen (TYLENOL) tablet 650 mg  650 mg Oral Q6H PRN [name], [name], MD       Or  . acetaminophen (TYLENOL) suppository 650 mg  650 mg Rectal Q6H PRN [name], [name], MD       . Melene Muller[START ON 09/19/2018] aspirin EC tablet 81 mg  81 mg Oral q morning - 10a [name], [name], MD      . bisacodyl (DULCOLAX) suppository 10 mg  10 mg Rectal Once Tahj Njoku U, MD      . enoxaparin (LOVENOX) injection 40 mg  40 mg Subcutaneous Q24H [name], [name], MD   40 mg at 09/18/18 [id number]  . finasteride (PROSCAR) tablet 5 mg  5 mg Oral Daily [name], [name], MD   5 mg at 09/18/18 1800  . magnesium oxide (MAG-OX) tablet 400 mg  400 mg Oral Daily [name], [name], MD   400 mg at 09/18/18 1800  . phosphorus (K PHOS NEUTRAL) tablet 250 mg  250 mg Oral Daily [name], [name], MD   250 mg at 09/18/18 1800  . potassium chloride SA (K-DUR) CR tablet 40 mEq  40 mEq Oral Daily [name], [name], MD   40 mEq at 09/18/18 1801  . simethicone (MYLICON) 40 MG/0.6ML suspension 40 mg  40 mg Oral QID [name], [name], MD   40 mg  at 09/18/18 1801  . tamsulosin (FLOMAX) capsule 0.4 mg  0.4 mg Oral QPM Pearson Grippe, MD   0.4 mg at 09/18/18 1801    Allergies as of 09/17/2018  . (No Known Allergies)    Family History  Problem Relation Age of Onset  . Gallbladder disease Other     Social History   Socioeconomic History  . Marital status: Single    Spouse name: Not on file  . Number of children: Not on file  . Years of education: Not on file  . Highest education level: Not on file  Occupational History  . Not on file  Social Needs  . Financial resource strain: Not on file  . Food insecurity:    Worry: Not on file    Inability: Not on file  . Transportation needs:    Medical: Not on file    Non-medical: Not on file  Tobacco Use  . Smoking status: Never Smoker  . Smokeless tobacco: Never Used  Substance and Sexual Activity  . Alcohol use: No  . Drug use: No  . Sexual activity: Never    Birth control/protection: None  Lifestyle  . Physical activity:    Days per week: Not on file    Minutes per session: Not on file  . Stress: Not on file  Relationships  . Social connections:    Talks on phone: Not on file    Gets together:  Not on file    Attends religious service: Not on file    Active member of club or organization: Not on file    Attends meetings of clubs or organizations: Not on file    Relationship status: Not on file  . Intimate partner violence:    Fear of current or ex partner: Not on file    Emotionally abused: Not on file    Physically abused: Not on file    Forced sexual activity: Not on file  Other Topics Concern  . Not on file  Social History Narrative   Patient is delayed lives with family    Review of Systems: See HPI, otherwise normal ROS  Physical Exam: Temp:  [98.3 F (36.8 C)-98.4 F (36.9 C)] 98.3 F (36.8 C) (06/04 1532) Pulse Rate:  [88-107] 107 (06/04 1532) Resp:  [15-18] 16 (06/04 1532) BP: (101-122)/(60-87) 113/74 (06/04 1532) SpO2:  [93 %-97 %] 97 % (06/04 1532) Weight:  [66.5 kg-74 kg] 66.5 kg (06/04 1532) Last BM Date: 09/18/18  Patient is alert and in no acute distress. He does not speak much but does respond appropriately to simple questions. Conjunctiva is pink.  Sclera is nonicteric. Oropharyngeal mucosa is normal. Neck without masses or thyromegaly. Cardiac exam with regular rhythm normal S1 and S2.  No murmur or gallop noted. Auscultation lungs reveal vesicular breath sounds bilaterally. Abdomen is distended.  He has well-healed lower abdominal scar partially covered with Steri-Strips. All sounds are normal.  Percussion note is very tympanitic.  On palpation abdomen is soft with mild tenderness at RLQ.  No organomegaly or masses. Rectal examination performed.  He has normal sphincter tone.  No evidence of fecal impaction.  26 Foley's catheter was placed in rectum for a few minutes with escape of very small amount of gas. Patient has Foley's catheter in place. He does not have peripheral edema or clubbing.    Lab Results: Recent Labs    09/18/18 0132  WBC 9.2  HGB 12.0*  HCT 38.2*  PLT 356   BMET Recent Labs  09/18/18 0132  NA 132*  K 4.9   CL 93*  CO2 26  GLUCOSE 214*  BUN 21  CREATININE 1.21  CALCIUM 9.9   LFT Recent Labs    09/18/18 0132  PROT 8.6*  ALBUMIN 4.0  AST 22  ALT 24  ALKPHOS 75  BILITOT 0.3   PT/INR No results for input(s): LABPROT, INR in the last 72 hours. Hepatitis Panel No results for input(s): HEPBSAG, HCVAB, HEPAIGM, HEPBIGM in the last 72 hours.  Studies/Results: Ct Abdomen Pelvis W Contrast  Result Date: 09/18/2018 CLINICAL DATA:  68 year old male with abdominal pain and distention. Rule out bowel obstruction. EXAM: CT ABDOMEN AND PELVIS WITH CONTRAST TECHNIQUE: Multidetector CT imaging of the abdomen and pelvis was performed using the standard protocol following bolus administration of intravenous contrast. CONTRAST:  OMNIPAQUE IOHEXOL 300 MG/ML  SOLN COMPARISON:  Abdominal radiograph dated 09/06/2018 and CT dated 09/05/2018 FINDINGS: Lower chest: The visualized lung bases are clear. No intra-abdominal free air or free fluid. Hepatobiliary: The liver is unremarkable. No intrahepatic biliary ductal dilatation. The gallbladder is contracted around a percutaneous cholecystostomy. Pancreas: Unremarkable. No pancreatic ductal dilatation or surrounding inflammatory changes. Spleen: Normal in size without focal abnormality. Adrenals/Urinary Tract: The adrenal glands are unremarkable. Several nonobstructing left renal calculi measure up to 15 mm in the interpolar left kidney. There is no hydronephrosis on either side. There is symmetric enhancement and excretion of contrast by both kidneys. The visualized ureters appear unremarkable. The urinary bladder is decompressed around a Foley catheter. Stomach/Bowel: There is diffuse circumferential thickening of the visualized distal esophagus which may represent esophagitis secondary to recurrent reflux. An infiltrative process is not entirely excluded. There is moderate to severe distention of the stomach with air and content. There is diffuse dilatation of  air-filled loops of small bowel measuring up to 7.5 cm in diameter in the anterior abdomen. Findings are somewhat similar to prior CT and may represent an ileus. There are however multiple normal caliber loops of small bowel in the left lower abdomen and therefore a degree of obstruction is not entirely excluded. Small-bowel series may provide better evaluation to exclude a mechanical obstruction. There is also diffuse air distention of the colon. There is no pneumatosis. The appendix is not visualized with certainty. Vascular/Lymphatic: The abdominal aorta and IVC appear unremarkable. No portal venous gas. There is no adenopathy. Reproductive: Multiple surgical clips in the region of the prostate gland. Other: None Musculoskeletal: Degenerative changes of the spine. No acute osseous pathology. IMPRESSION: 1. Diffusely dilated and air-filled loops of small bowel similar to prior CT and may represent an ileus. A degree of small-bowel obstruction is not entirely excluded. Small-bowel series may provide better evaluation. Clinical correlation and follow-up recommended. 2. No free air or pneumatosis.  No portal venous gas. 3. Second frontal thickening of the distal esophagus may represent esophagitis although infiltrative mass is not excluded. Clinical correlation is recommended. 4. Percutaneous cholecystostomy with contracted gallbladder. 5. Nonobstructing left renal calculi. No hydronephrosis. Electronically Signed   By: Elgie Collard M.D.   On: 09/18/2018 04:08   I have reviewed current and last CT as well as CT from 09/05/2018 and single: Small bowel study from 09/01/2018 which revealed contrast only in the stomach.  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  It appears this condition has worsened since he had a laparotomy for reduction of small bowel volvulus about 3 weeks ago.  Do not believe he has small bowel  obstruction but obviously we have to  monitor his clinical course very carefully. He definitely will benefit from increased physical activity and assistance and using bedside commode or or going to the bathroom. He may be a candidate for further therapy with neostigmine or prucalopride which is 5 HD for receptor agonist and has beneficial effect on GI motility of GI tract. He would also benefit from a course of antibiotic therapy as small intestinal bacterial overgrowth is very common in such patients.   Recommendations;  PT consult.  Goal is to get him back to ambulation. Staff to assist patient to bathroom commode to bedside commode 3 times a day. Begin clear liquids. Metronidazole 250 mg by mouth 3 times a day. Resume Mylicon 81 tablet 4 times daily. Patient will be reevaluated in a.m.   LOS: 0 days   Brunetta Newingham  09/18/2018, 7:25 PM

## 2018-09-18 NOTE — ED Notes (Signed)
Caregiver [name] has been updated. She would like to be updated once pt has a room and is admitted. Her phone number is [phone].

## 2018-09-18 NOTE — ED Notes (Signed)
ED TO INPATIENT HANDOFF REPORT  ED Nurse Name and Phone #: Wilkie Aye [phone]  S Name/Age/Gender [name] 68 y.o. male Room/Bed: APA19/APA19  Code Status   Code Status: Full Code  Home/SNF/Other Nursing Home Patient oriented to: self Is this baseline? Yes   Triage Complete: Triage complete  Chief Complaint Abdominal pain  Triage Note Pt sent over to rule out bowel obstruction.   Allergies No Known Allergies  Level of Care/Admitting Diagnosis ED Disposition    ED Disposition Condition Comment   Admit  [facility] [[id number]]  Level of Care: Med-Surg [16]  Covid Evaluation: Confirmed COVID Negative  Diagnosis: Abdominal distention [960454]  Admitting Physician: Pearson Grippe [[id number]]  Attending Physician: Pearson Grippe [[id number]]  PT Class (Do Not Modify): Observation [104]  PT Acc Code (Do Not Modify): Observation [10022]       B Medical/Surgery History Past Medical History:  Diagnosis Date  . Diabetes mellitus without complication (HCC)   . History of gout   . History of kidney stones   . Hypertension   . Mentally challenged   . Urinary retention    Past Surgical History:  Procedure Laterality Date  . CYSTOSCOPY W/ URETERAL STENT PLACEMENT Bilateral 06/20/2016   Procedure: CYSTOSCOPY WITH RETROGRADE PYELOGRAM/URETERAL STENT PLACEMENT;  Surgeon: Malen Gauze, MD;  Location: [facility];  Service: Urology;  Laterality: Bilateral;  . CYSTOSCOPY WITH RETROGRADE PYELOGRAM, URETEROSCOPY AND STENT PLACEMENT Bilateral 08/20/2016   Procedure: CYSTOSCOPY WITH BILATERAL RETROGRADE PYELOGRAM, BILATERAL URETEROSCOPY,  LASER LITHOTRIPSY OF RIGHT URETERAL CALCULI, STONE BASKET EXTRACTION LEFT URETERAL CALCULI  AND BILATERAL URETERAL STENT EXCHANGE;  Surgeon: Malen Gauze, MD;  Location: [facility];  Service: Urology;  Laterality: Bilateral;  . HOLMIUM LASER APPLICATION Bilateral 08/20/2016   Procedure: HOLMIUM LASER APPLICATION;  Surgeon: Malen Gauze, MD;   Location: [facility];  Service: Urology;  Laterality: Bilateral;  . IR CATHETER TUBE CHANGE  07/04/2018  . IR EXCHANGE BILIARY DRAIN  06/09/2018  . IR PERC CHOLECYSTOSTOMY  05/24/2018  . LAPAROTOMY N/A 08/25/2018   Procedure: EXPLORATORY LAPAROTOMY reduction of volvulus;  Surgeon: [name], MD;  Location: [facility];  Service: General;  Laterality: N/A;     A IV Location/Drains/Wounds Patient Lines/Drains/Airways Status   Active Line/Drains/Airways    Name:   Placement date:   Placement time:   Site:   Days:   Peripheral IV 09/18/18 Left;Posterior Forearm   09/18/18    0018    Forearm   less than 1   Biliary Tube Cook slip-coat 12 Fr. RUQ   07/04/18    0901    RUQ   76   Urethral Catheter MTaylor Straight-tip 14 Fr.   09/06/18    1044    Straight-tip   12   Ureteral Drain/Stent Right ureter 6 Fr.   08/20/16    1110    Right ureter   759   Ureteral Drain/Stent Left ureter 6 Fr.   08/20/16    1122    Left ureter   759   Incision (Closed) 08/26/18 Abdomen   08/26/18    0112     23   Pressure Injury 04/07/18 Stage I -  Intact skin with non-blanchable redness of a localized area usually over a bony prominence.   04/07/18    2100     164   Pressure Injury 04/11/18 Deep Tissue Injury - Purple or maroon localized area of discolored intact skin or blood-filled blister due to damage of  underlying soft tissue from pressure and/or shear. 2cmx2cm   04/11/18    2200     160   Pressure Injury 04/11/18 Deep Tissue Injury - Purple or maroon localized area of discolored intact skin or blood-filled blister due to damage of underlying soft tissue from pressure and/or shear. 2 cm x 2 cm   04/11/18    2200     160   Pressure Injury 05/24/18 Deep Tissue Injury - Purple or maroon localized area of discolored intact skin or blood-filled blister due to damage of underlying soft tissue from pressure and/or shear. Dried crusty purple BIL Heels   05/24/18    0800     117   Wound / Incision (Open or Dehisced) 05/24/18  Non-pressure wound Buttocks Right;Left;Mid Macerated with some skin breakdown   05/24/18    0800    Buttocks   117          Intake/Output Last 24 hours No intake or output data in the 24 hours ending 09/18/18 9379  Labs/Imaging Results for orders placed or performed during the hospital encounter of 09/18/18 (from the past 48 hour(s))  Urinalysis, Routine w reflex microscopic     Status: Abnormal   Collection Time: 09/18/18  1:00 AM  Result Value Ref Range   Color, Urine YELLOW YELLOW   APPearance CLOUDY (A) CLEAR   Specific Gravity, Urine 1.016 1.005 - 1.030   pH 6.0 5.0 - 8.0   Glucose, UA NEGATIVE NEGATIVE mg/dL   Hgb urine dipstick LARGE (A) NEGATIVE   Bilirubin Urine NEGATIVE NEGATIVE   Ketones, ur NEGATIVE NEGATIVE mg/dL   Protein, ur 024 (A) NEGATIVE mg/dL   Nitrite NEGATIVE NEGATIVE   Leukocytes,Ua LARGE (A) NEGATIVE   RBC / HPF >50 (H) 0 - 5 RBC/hpf   WBC, UA >50 (H) 0 - 5 WBC/hpf   Bacteria, UA MANY (A) NONE SEEN   Squamous Epithelial / LPF 0-5 0 - 5   WBC Clumps PRESENT     Comment: Performed at [facility], [address], [place], [place] [address]  Comprehensive metabolic panel     Status: Abnormal   Collection Time: 09/18/18  1:32 AM  Result Value Ref Range   Sodium 132 (L) 135 - 145 mmol/L   Potassium 4.9 3.5 - 5.1 mmol/L   Chloride 93 (L) 98 - 111 mmol/L   CO2 26 22 - 32 mmol/L   Glucose, Bld 214 (H) 70 - 99 mg/dL   BUN 21 8 - 23 mg/dL   Creatinine, Ser 3.29 0.61 - 1.24 mg/dL   Calcium 9.9 8.9 - 92.4 mg/dL   Total Protein 8.6 (H) 6.5 - 8.1 g/dL   Albumin 4.0 3.5 - 5.0 g/dL   AST 22 15 - 41 U/L   ALT 24 0 - 44 U/L   Alkaline Phosphatase 75 38 - 126 U/L   Total Bilirubin 0.3 0.3 - 1.2 mg/dL   GFR calc non Af Amer >60 >60 mL/min   GFR calc Af Amer >60 >60 mL/min   Anion gap 13 5 - 15    Comment: Performed at [facility], [address], [place], [place] [address]  CBC with Differential     Status: Abnormal   Collection Time: 09/18/18  1:32 AM  Result  Value Ref Range   WBC 9.2 4.0 - 10.5 K/uL   RBC 4.26 4.22 - 5.81 MIL/uL   Hemoglobin 12.0 (L) 13.0 - 17.0 g/dL   HCT 19.6 (L) 22.2 - 97.9 %  MCV 89.7 80.0 - 100.0 fL   MCH 28.2 26.0 - 34.0 pg   MCHC 31.4 30.0 - 36.0 g/dL   RDW 16.113.6 09.611.5 - 04.515.5 %   Platelets 356 150 - 400 K/uL   nRBC 0.0 0.0 - 0.2 %   Neutrophils Relative % 81 %   Neutro Abs 7.4 1.7 - 7.7 K/uL   Lymphocytes Relative 14 %   Lymphs Abs 1.3 0.7 - 4.0 K/uL   Monocytes Relative 5 %   Monocytes Absolute 0.4 0.1 - 1.0 K/uL   Eosinophils Relative 0 %   Eosinophils Absolute 0.0 0.0 - 0.5 K/uL   Basophils Relative 0 %   Basophils Absolute 0.0 0.0 - 0.1 K/uL   Immature Granulocytes 0 %   Abs Immature Granulocytes 0.01 0.00 - 0.07 K/uL    Comment: Performed at [facility], [address] [place], [place] [address]  Lactic acid, plasma     Status: None   Collection Time: 09/18/18  1:32 AM  Result Value Ref Range   Lactic Acid, Venous 1.4 0.5 - 1.9 mmol/L    Comment: Performed at [facility], [address] [place], [place] [address]  Lipase, blood     Status: None   Collection Time: 09/18/18  1:32 AM  Result Value Ref Range   Lipase 35 11 - 51 U/L    Comment: Performed at [facility], [address] [place], [place] [address]  Magnesium     Status: None   Collection Time: 09/18/18  1:32 AM  Result Value Ref Range   Magnesium 2.0 1.7 - 2.4 mg/dL    Comment: Performed at [facility], [address] [place], [place] [address]  SARS Coronavirus 2 (CEPHEID - Performed in [facility] hospital lab), Hosp Order     Status: None   Collection Time: 09/18/18  2:02 AM  Result Value Ref Range   SARS Coronavirus 2 NEGATIVE NEGATIVE    Comment: (NOTE) If result is NEGATIVE SARS-CoV-2 target nucleic acids are NOT DETECTED. The SARS-CoV-2 RNA is generally detectable in upper and lower  respiratory specimens during the acute phase of infection. The lowest  concentration of SARS-CoV-2 viral copies this assay can detect is 250   copies / mL. A negative result does not preclude SARS-CoV-2 infection  and should not be used as the sole basis for treatment or other  patient management decisions.  A negative result may occur with  improper specimen collection / handling, submission of specimen other  than nasopharyngeal swab, presence of viral mutation(s) within the  areas targeted by this assay, and inadequate number of viral copies  (<250 copies / mL). A negative result must be combined with clinical  observations, patient history, and epidemiological information. If result is POSITIVE SARS-CoV-2 target nucleic acids are DETECTED. The SARS-CoV-2 RNA is generally detectable in upper and lower  respiratory specimens dur ing the acute phase of infection.  Positive  results are indicative of active infection with SARS-CoV-2.  Clinical  correlation with patient history and other diagnostic information is  necessary to determine patient infection status.  Positive results do  not rule out bacterial infection or co-infection with other viruses. If result is PRESUMPTIVE POSTIVE SARS-CoV-2 nucleic acids MAY BE PRESENT.   A presumptive positive result was obtained on the submitted specimen  and confirmed on repeat testing.  While 2019 novel coronavirus  (SARS-CoV-2) nucleic acids may be present in the submitted sample  additional confirmatory testing may be necessary for epidemiological  and /  or clinical management purposes  to differentiate between  SARS-CoV-2 and other Sarbecovirus currently known to infect humans.  If clinically indicated additional testing with an alternate test  methodology [phone]) is advised. The SARS-CoV-2 RNA is generally  detectable in upper and lower respiratory sp ecimens during the acute  phase of infection. The expected result is Negative. Fact Sheet for Patients:  BoilerBrush.com.cy Fact Sheet for Healthcare  Providers: https://pope.com/ This test is not yet approved or cleared by the [place] FDA and has been authorized for detection and/or diagnosis of SARS-CoV-2 by FDA under an Emergency Use Authorization (EUA).  This EUA will remain in effect (meaning this test can be used) for the duration of the COVID-19 declaration under Section 564(b)(1) of the Act, 21 U.S.C. section 360bbb-3(b)(1), unless the authorization is terminated or revoked sooner. Performed at [facility], [address], [place], [place] [address]    Ct Abdomen Pelvis W Contrast  Result Date: 09/18/2018 CLINICAL DATA:  68 year old male with abdominal pain and distention. Rule out bowel obstruction. EXAM: CT ABDOMEN AND PELVIS WITH CONTRAST TECHNIQUE: Multidetector CT imaging of the abdomen and pelvis was performed using the standard protocol following bolus administration of intravenous contrast. CONTRAST:  OMNIPAQUE IOHEXOL 300 MG/ML  SOLN COMPARISON:  Abdominal radiograph dated 09/06/2018 and CT dated 09/05/2018 FINDINGS: Lower chest: The visualized lung bases are clear. No intra-abdominal free air or free fluid. Hepatobiliary: The liver is unremarkable. No intrahepatic biliary ductal dilatation. The gallbladder is contracted around a percutaneous cholecystostomy. Pancreas: Unremarkable. No pancreatic ductal dilatation or surrounding inflammatory changes. Spleen: Normal in size without focal abnormality. Adrenals/Urinary Tract: The adrenal glands are unremarkable. Several nonobstructing left renal calculi measure up to 15 mm in the interpolar left kidney. There is no hydronephrosis on either side. There is symmetric enhancement and excretion of contrast by both kidneys. The visualized ureters appear unremarkable. The urinary bladder is decompressed around a Foley catheter. Stomach/Bowel: There is diffuse circumferential thickening of the visualized distal esophagus which may represent esophagitis  secondary to recurrent reflux. An infiltrative process is not entirely excluded. There is moderate to severe distention of the stomach with air and content. There is diffuse dilatation of air-filled loops of small bowel measuring up to 7.5 cm in diameter in the anterior abdomen. Findings are somewhat similar to prior CT and may represent an ileus. There are however multiple normal caliber loops of small bowel in the left lower abdomen and therefore a degree of obstruction is not entirely excluded. Small-bowel series may provide better evaluation to exclude a mechanical obstruction. There is also diffuse air distention of the colon. There is no pneumatosis. The appendix is not visualized with certainty. Vascular/Lymphatic: The abdominal aorta and IVC appear unremarkable. No portal venous gas. There is no adenopathy. Reproductive: Multiple surgical clips in the region of the prostate gland. Other: None Musculoskeletal: Degenerative changes of the spine. No acute osseous pathology. IMPRESSION: 1. Diffusely dilated and air-filled loops of small bowel similar to prior CT and may represent an ileus. A degree of small-bowel obstruction is not entirely excluded. Small-bowel series may provide better evaluation. Clinical correlation and follow-up recommended. 2. No free air or pneumatosis.  No portal venous gas. 3. Second frontal thickening of the distal esophagus may represent esophagitis although infiltrative mass is not excluded. Clinical correlation is recommended. 4. Percutaneous cholecystostomy with contracted gallbladder. 5. Nonobstructing left renal calculi. No hydronephrosis. Electronically Signed   By: Elgie Collard M.D.   On: 09/18/2018 04:08    Pending  Labs [employer] (From admission, onward)    Start     Ordered   09/25/18 0500  Creatinine, serum  (enoxaparin (LOVENOX)    CrCl >/= 30 ml/min)  Weekly,   R    Comments:  while on enoxaparin therapy    09/18/18 0555   09/19/18 0500  Comprehensive  metabolic panel  Tomorrow morning,   R     09/18/18 0555   09/19/18 0500  CBC  Tomorrow morning,   R     09/18/18 0555          Vitals/Pain Today's Vitals   09/17/18 2353 09/18/18 0000 09/18/18 0300  BP:  122/74 122/73  Pulse:  99   Resp:  18   Temp:  98.4 F (36.9 C)   SpO2:  96%   Weight: 74 kg    Height:  (1.702 m)      Isolation Precautions No active isolations  Medications Medications  enoxaparin (LOVENOX) injection 40 mg (has no administration in time range)  0.9 %  sodium chloride infusion (has no administration in time range)  acetaminophen (TYLENOL) tablet 650 mg (has no administration in time range)    Or  acetaminophen (TYLENOL) suppository 650 mg (has no administration in time range)  iohexol (OMNIPAQUE) 300 MG/ML solution 100 mL (100 mLs Intravenous Contrast Given 09/18/18 1610)    Mobility non-ambulatory High fall risk   Focused Assessments    R Recommendations: See Admitting Provider Note  Report given to:   Additional Notes: Pt is nonverbal and has legal guardian. [facility]. He has foley catheter and bilary tube.

## 2018-09-18 NOTE — Progress Notes (Signed)
After assisting patient to bedside, pt passed a large soft BM. He also was able to pass a lot of gas. Noted distention in abdomen. Pt does not c/o of pain. Will continue to monitor patient overnight.

## 2018-09-18 NOTE — ED Provider Notes (Signed)
[facility] EMERGENCY DEPARTMENT Provider Note   CSN: [id number] Arrival date & time: 09/17/18  2352    History   Chief Complaint Chief Complaint  Patient presents with   Abdominal Pain    HPI [name] is a 68 y.o. male.   The history is provided by the nursing home. The history is limited by the condition of the patient (Patient nonverbal).  Abdominal Pain  He has history of diabetes mellitus, hypertension, hyperlipidemia, mental retardation, volvulus, small bowel obstruction from the skilled nursing facility for evaluation for possible recurrent bowel obstruction.  He was noted to have significant abdominal distention.  Past Medical History:  Diagnosis Date   Diabetes mellitus without complication (HCC)    History of gout    History of kidney stones    Hypertension    Mentally challenged    Urinary retention     Patient Active Problem List   Diagnosis Date Noted   Hypomagnesemia 09/09/2018   Protein-calorie malnutrition, severe (HCC) 09/09/2018   Acute blood loss anemia 09/09/2018   Gangrenous cholecystitis 09/05/2018   Volvulus (HCC) 09/03/2018   Ileus (HCC)    Pneumatosis of intestines    Gall bladder disease 08/25/2018   Small bowel ischemia (HCC)    Acute gangrenous cholecystitis 05/24/2018   SBO (small bowel obstruction) (HCC) 05/24/2018   Abdominal distention    Pressure injury of skin 04/09/2018   C. difficile diarrhea 04/08/2018   Bilateral hydronephrosis 04/08/2018   Pneumoperitoneum of unknown etiology    Diarrhea 04/07/2018   Nonverbal 04/07/2018   Hydronephrosis with urinary obstruction due to ureteral calculus 06/20/2016   Sepsis secondary to UTI (HCC) 06/20/2016   Fever 06/19/2016   Generalized weakness 06/19/2016   Diabetes mellitus type 2, noninsulin dependent (HCC) 06/19/2016   Severe sepsis with septic shock (HCC) 06/19/2016   UTI (urinary tract infection) 06/19/2016   Hematuria 06/19/2016   AKI  (acute kidney injury) (HCC) 06/19/2016   Hyponatremia 06/19/2016   Hypokalemia 06/19/2016   BPH (benign prostatic hyperplasia) 06/19/2016   Pseudo-obstruction of colon 05/07/2014   Urinary retention 05/07/2014   Intellectual disability 05/07/2014   Hyperlipidemia 05/07/2014   HTN (hypertension) 05/07/2014    Past Surgical History:  Procedure Laterality Date   CYSTOSCOPY W/ URETERAL STENT PLACEMENT Bilateral 06/20/2016   Procedure: CYSTOSCOPY WITH RETROGRADE PYELOGRAM/URETERAL STENT PLACEMENT;  Surgeon: Malen Gauze, MD;  Location: [facility];  Service: Urology;  Laterality: Bilateral;   CYSTOSCOPY WITH RETROGRADE PYELOGRAM, URETEROSCOPY AND STENT PLACEMENT Bilateral 08/20/2016   Procedure: CYSTOSCOPY WITH BILATERAL RETROGRADE PYELOGRAM, BILATERAL URETEROSCOPY,  LASER LITHOTRIPSY OF RIGHT URETERAL CALCULI, STONE BASKET EXTRACTION LEFT URETERAL CALCULI  AND BILATERAL URETERAL STENT EXCHANGE;  Surgeon: Malen Gauze, MD;  Location: [facility];  Service: Urology;  Laterality: Bilateral;   HOLMIUM LASER APPLICATION Bilateral 08/20/2016   Procedure: HOLMIUM LASER APPLICATION;  Surgeon: Malen Gauze, MD;  Location: [facility];  Service: Urology;  Laterality: Bilateral;   IR CATHETER TUBE CHANGE  07/04/2018   IR EXCHANGE BILIARY DRAIN  06/09/2018   IR PERC CHOLECYSTOSTOMY  05/24/2018   LAPAROTOMY N/A 08/25/2018   Procedure: EXPLORATORY LAPAROTOMY reduction of volvulus;  Surgeon: [name], MD;  Location: [facility];  Service: General;  Laterality: N/A;        Home Medications    Prior to Admission medications   Medication Sig Start Date End Date Taking? Authorizing Provider  Acetaminophen (TYLENOL ARTHRITIS PAIN PO) Take 1 tablet by mouth every 6 (six) hours as needed (pain).  [provider]  aspirin EC 81 MG tablet Take 81 mg by mouth every morning.     [provider]  Despina HiddenBalsam [place]-Castor Oil [facility](VENELEX) OINT Apply topically to sacrum and bilateral  buttocks every shift and as needed for blanchable erythema and prevention 09/10/18   [provider]  finasteride (PROSCAR) 5 MG tablet Take 1 tablet (5 mg total) by mouth daily for 30 days. 09/10/18 10/10/18  Sherryll BurgerShah, Pratik D, DO  Glucerna (GLUCERNA) LIQD Take 237 mLs by mouth 3 (three) times daily between meals. 09/04/18   [provider]  magnesium oxide (MAG-OX) 400 MG tablet Take 1 tablet (400 mg total) by mouth daily for 30 days. 09/09/18 10/09/18  Sherryll BurgerShah, Pratik D, DO  metFORMIN (GLUCOPHAGE) 500 MG tablet Take 1,000 mg by mouth every morning. 02/03/18   [provider]  NON FORMULARY Diet Type:  Soft Diet, NAS, Cons CHO 09/09/18   [provider]  phosphorus (K PHOS NEUTRAL) 155-852-130 MG tablet Take 1 tablet (250 mg total) by mouth daily. 09/04/18   Catarina Hartshornat, Devlyn Parish, MD  potassium chloride 20 MEQ TBCR Take 40 mEq by mouth daily for 30 days. 09/09/18 10/09/18  Sherryll BurgerShah, Pratik D, DO  simethicone (MYLICON) 40 MG/0.6ML drops Take 0.6 mLs (40 mg total) by mouth 4 (four) times daily. 09/04/18   Catarina Hartshornat, Lakeria Starkman, MD  sulfamethoxazole-trimethoprim (BACTRIM DS) 800-160 MG tablet Take 1 tablet by mouth 2 (two) times daily. 09/16/18   Lucretia RoersBridges, Lindsay C, MD  tamsulosin (FLOMAX) 0.4 MG CAPS capsule Take 1 capsule (0.4 mg total) by mouth every evening. 04/12/18   Erick BlinksMemon, Jehanzeb, MD    Family History Family History  Problem Relation Age of Onset   Gallbladder disease Other     Social History Social History   Tobacco Use   Smoking status: Never Smoker   Smokeless tobacco: Never Used  Substance Use Topics   Alcohol use: No   Drug use: No     Allergies   Patient has no known allergies.   Review of Systems Review of Systems  Unable to perform ROS: Patient nonverbal  Gastrointestinal: Positive for abdominal pain.     Physical Exam Updated Vital Signs BP 122/74    Pulse 99    Temp 98.4 F (36.9 C)    Resp 18    Ht 5\' 7"  (1.702 m)    Wt 74 kg    SpO2 96%    BMI 25.55 kg/m     Physical Exam Vitals signs and nursing note reviewed.    68 year old male, resting comfortably and in no acute distress. Vital signs are normal. Oxygen saturation is 96%, which is normal. Head is normocephalic and atraumatic. PERRLA, EOMI. Oropharynx is clear. Neck is nontender and supple without adenopathy or JVD. Back is nontender and there is no CVA tenderness. Lungs are clear without rales, wheezes, or rhonchi. Chest is nontender. Heart has regular rate and rhythm without murmur. Abdomen is soft, massively distended, but nontender without masses or hepatosplenomegaly and peristalsis is hypoactive.  Recent midline surgical scar is present and healing well with Steri-Strips in place. Extremities have no cyanosis or edema, full range of motion is present. Skin is warm and dry without rash. Neurologic: Awake but nonverbal, cranial nerves are intact, there are no motor or sensory deficits.  ED Treatments / Results  Labs (all labs ordered are listed, but only abnormal results are displayed) Labs Reviewed  COMPREHENSIVE METABOLIC PANEL - Abnormal; Notable for the following components:  Result Value   Sodium 132 (*)    Chloride 93 (*)    Glucose, Bld 214 (*)    Total Protein 8.6 (*)    All other components within normal limits  CBC WITH DIFFERENTIAL/PLATELET - Abnormal; Notable for the following components:   Hemoglobin 12.0 (*)    HCT 38.2 (*)    All other components within normal limits  URINALYSIS, ROUTINE W REFLEX MICROSCOPIC - Abnormal; Notable for the following components:   APPearance CLOUDY (*)    Hgb urine dipstick LARGE (*)    Protein, ur 100 (*)    Leukocytes,Ua LARGE (*)    RBC / HPF >50 (*)    WBC, UA >50 (*)    Bacteria, UA MANY (*)    All other components within normal limits  SARS CORONAVIRUS 2 (HOSPITAL ORDER, PERFORMED IN [facility])  LACTIC ACID, PLASMA  LIPASE, BLOOD  MAGNESIUM   Radiology Ct Abdomen Pelvis W Contrast  Result Date:  09/18/2018 CLINICAL DATA:  68 year old male with abdominal pain and distention. Rule out bowel obstruction. EXAM: CT ABDOMEN AND PELVIS WITH CONTRAST TECHNIQUE: Multidetector CT imaging of the abdomen and pelvis was performed using the standard protocol following bolus administration of intravenous contrast. CONTRAST:  OMNIPAQUE IOHEXOL 300 MG/ML  SOLN COMPARISON:  Abdominal radiograph dated 09/06/2018 and CT dated 09/05/2018 FINDINGS: Lower chest: The visualized lung bases are clear. No intra-abdominal free air or free fluid. Hepatobiliary: The liver is unremarkable. No intrahepatic biliary ductal dilatation. The gallbladder is contracted around a percutaneous cholecystostomy. Pancreas: Unremarkable. No pancreatic ductal dilatation or surrounding inflammatory changes. Spleen: Normal in size without focal abnormality. Adrenals/Urinary Tract: The adrenal glands are unremarkable. Several nonobstructing left renal calculi measure up to 15 mm in the interpolar left kidney. There is no hydronephrosis on either side. There is symmetric enhancement and excretion of contrast by both kidneys. The visualized ureters appear unremarkable. The urinary bladder is decompressed around a Foley catheter. Stomach/Bowel: There is diffuse circumferential thickening of the visualized distal esophagus which may represent esophagitis secondary to recurrent reflux. An infiltrative process is not entirely excluded. There is moderate to severe distention of the stomach with air and content. There is diffuse dilatation of air-filled loops of small bowel measuring up to 7.5 cm in diameter in the anterior abdomen. Findings are somewhat similar to prior CT and may represent an ileus. There are however multiple normal caliber loops of small bowel in the left lower abdomen and therefore a degree of obstruction is not entirely excluded. Small-bowel series may provide better evaluation to exclude a mechanical obstruction. There is also diffuse  air distention of the colon. There is no pneumatosis. The appendix is not visualized with certainty. Vascular/Lymphatic: The abdominal aorta and IVC appear unremarkable. No portal venous gas. There is no adenopathy. Reproductive: Multiple surgical clips in the region of the prostate gland. Other: None Musculoskeletal: Degenerative changes of the spine. No acute osseous pathology. IMPRESSION: 1. Diffusely dilated and air-filled loops of small bowel similar to prior CT and may represent an ileus. A degree of small-bowel obstruction is not entirely excluded. Small-bowel series may provide better evaluation. Clinical correlation and follow-up recommended. 2. No free air or pneumatosis.  No portal venous gas. 3. Second frontal thickening of the distal esophagus may represent esophagitis although infiltrative mass is not excluded. Clinical correlation is recommended. 4. Percutaneous cholecystostomy with contracted gallbladder. 5. Nonobstructing left renal calculi. No hydronephrosis. Electronically Signed   By: Ceasar Mons.D.  On: 09/18/2018 04:08    Procedures Procedures  Medications Ordered in ED Medications - No data to display   Initial Impression / Assessment and Plan / ED Course  I have reviewed the triage vital signs and the nursing notes.  Pertinent labs & imaging results that were available during my care of the patient were reviewed by me and considered in my medical decision making (see chart for details).  Abdominal distention, rule out recurrent small bowel obstruction.  Old records are reviewed, and he was admitted on May 11 for small bowel obstruction found to be due to a volvulus, treated with exploratory laparotomy.  Also admitted on May 22 for postoperative ileus.  Will check screening labs and sent for CT of abdomen and pelvis.  CT suggests ileus, possible bowel obstruction.  Labs show mild anemia which is improved compared with baseline, and mild hyponatremia.  Case is discussed  with Dr. Selena Batten of [facility] hospital[facility], who agrees to admit the patient.  Final Clinical Impressions(s) / ED Diagnoses   Final diagnoses:  Ileus (HCC)  Hyponatremia  Normochromic normocytic anemia    ED Discharge Orders    None       Dione Booze, MD 09/18/18 [phone]

## 2018-09-18 NOTE — Progress Notes (Addendum)
Per HPI: [name]  is a 68 y.o. male,w intellectual disability, dm2, Bph, h/o acute gangrenous cholecystitis s/p percutaneous cholecystostomy 05/25/2018, w admission for SBO s/p exp lap w reduction of volvulus 08/26/2018, w admission for Ileus 09/05/2018 presents from SNF for evaluation of ? SBO.  Pt is a poor historian and unable to give any meaningful history.    Review of records shows that pt was seen by surgery on 09/16/2018 => slight erythema around staple line and started on Bactrim. Cont w cholecystostomy tube  Patient has CT of the abdomen and pelvis in the ED with noted diffusely dilated and air-filled loops of small bowel similar to prior CT which was thought to represent ileus.  He has been made n.p.o. with general surgery consultation requested.  Small bowel follow-through ordered and pending.  He is also noted to have UTI for which she has been started empirically on Rocephin with urine cultures pending.

## 2018-09-18 NOTE — Care Management (Signed)
Notified by Bonita Quin of [facility], that patient is active with [facility] for RN and PT.

## 2018-09-18 NOTE — Progress Notes (Signed)
[facility]  Patient known to me. Returned from [place] center with reported abdominal distention and abdominal pain?Marland Kitchen Unsure if patient was vomiting as it is not documented. CT performed and dilated loops of bowel noted. Patient has been having Bms in the hospital. SBFT has been ordered and I have spoke with Dr. Tyron Russell regarding the last SBFT and slow transit, concern for motility issues.  Dr. Karilyn Cota is already aware of the patient, and I have asked him to weigh in officially today given the continued issues.  Labs reviewed and no leukocytosis or concerning finding. CT reviewed air in the dilated small bowel loops and into the colon/ rectum.    BP 105/60   Pulse 90   Temp 98.4 F (36.9 C)   Resp 16   Ht 5\' 7"  (1.702 m)   Wt 74 kg   SpO2 95%   BMI 25.55 kg/m   No distress Distended Midline healing, steri in place, redness from the other day improved at staple sites   SBFT pending but reported to be moving slowly/ not moving past stomach by Dr. Tyron Russell  [name] is a 68 yo s/p Ex lap reduction of volvulus caused by dilated loops of bowel folding onto the ileum 08/26/18 after CT with pneumatosis performed. Patient has been having ileus since that time and has a long standing history of dysmotility based on his reported history of distention. Patient having BMs in the ED and no reported vomiting in the chart. I do not believe he is obstructed but has dysmotility that is causing these long standing issues.  -SBFT pending -Dr. Karilyn Cota will take a look at the patient to see if he is a candidate for any medications that could help  -Cholecystostomy tube care to continue   Attempted to call [name], but she did not answer and no voicemail set up.   Levi Greenhouse, MD [facility] [address] [place], [place] [address] [phone] (office)

## 2018-09-19 ENCOUNTER — Encounter (HOSPITAL_COMMUNITY)
Admission: RE | Admit: 2018-09-19 | Discharge: 2018-09-19 | Disposition: A | Discharge status: Home / Self Care | Payer: Medicare Other | Source: Skilled Nursing Facility | Attending: Internal Medicine | Admitting: Internal Medicine

## 2018-09-19 DIAGNOSIS — Z48815 Encounter for surgical aftercare following surgery on the digestive system: Secondary | ICD-10-CM | POA: Insufficient documentation

## 2018-09-19 DIAGNOSIS — R140 Abdominal distension (gaseous): Secondary | ICD-10-CM | POA: Diagnosis not present

## 2018-09-19 DIAGNOSIS — K567 Ileus, unspecified: Secondary | ICD-10-CM | POA: Diagnosis not present

## 2018-09-19 DIAGNOSIS — Z978 Presence of other specified devices: Secondary | ICD-10-CM | POA: Insufficient documentation

## 2018-09-19 DIAGNOSIS — E119 Type 2 diabetes mellitus without complications: Secondary | ICD-10-CM | POA: Insufficient documentation

## 2018-09-19 DIAGNOSIS — Z741 Need for assistance with personal care: Secondary | ICD-10-CM | POA: Insufficient documentation

## 2018-09-19 DIAGNOSIS — I10 Essential (primary) hypertension: Secondary | ICD-10-CM | POA: Insufficient documentation

## 2018-09-19 LAB — COMPREHENSIVE METABOLIC PANEL
ALT: 20 U/L (ref 0–44)
AST: 16 U/L (ref 15–41)
Albumin: 3.5 g/dL (ref 3.5–5.0)
Alkaline Phosphatase: 59 U/L (ref 38–126)
Anion gap: 6 (ref 5–15)
BUN: 25 mg/dL — ABNORMAL HIGH (ref 8–23)
CO2: 26 mmol/L (ref 22–32)
Calcium: 9.4 mg/dL (ref 8.9–10.3)
Chloride: 103 mmol/L (ref 98–111)
Creatinine, Ser: 1.08 mg/dL (ref 0.61–1.24)
GFR calc Af Amer: >60 mL/min (ref >60)
GFR calc non Af Amer: >60 mL/min (ref >60)
Glucose, Bld: 152 mg/dL — ABNORMAL HIGH (ref 70–99)
Potassium: 5.1 mmol/L (ref 3.5–5.1)
Sodium: 135 mmol/L (ref 135–145)
Total Bilirubin: 0.5 mg/dL (ref 0.3–1.2)
Total Protein: 7.6 g/dL (ref 6.5–8.1)

## 2018-09-19 LAB — CBC
HCT: 32.3 % — ABNORMAL LOW (ref 39.0–52.0)
Hemoglobin: 10.2 g/dL — ABNORMAL LOW (ref 13.0–17.0)
MCH: 28.6 pg (ref 26.0–34.0)
MCHC: 31.6 g/dL (ref 30.0–36.0)
MCV: 90.5 fL (ref 80.0–100.0)
Platelets: 276 10*3/uL (ref 150–400)
RBC: 3.57 MIL/uL — ABNORMAL LOW (ref 4.22–5.81)
RDW: 13.7 % (ref 11.5–15.5)
WBC: 6.2 10*3/uL (ref 4.0–10.5)
nRBC: 0 % (ref 0.0–0.2)

## 2018-09-19 MED ORDER — SODIUM CHLORIDE 0.9 % IV SOLN
1.0000 g | INTRAVENOUS | Status: DC
  Administered 2018-09-19 – 2018-09-20 (×2): 1 g via INTRAVENOUS
  Filled 2018-09-19 (×2): qty 10

## 2018-09-19 MED ORDER — SODIUM CHLORIDE 0.9 % IV SOLN
INTRAVENOUS | Status: DC | PRN
  Administered 2018-09-19: 500 mL via INTRAVENOUS

## 2018-09-19 MED ORDER — JUVEN PO PACK
1.0000 | PACK | Freq: Two times a day (BID) | ORAL | Status: DC
  Administered 2018-09-19 – 2018-09-20 (×2): 1 via ORAL
  Filled 2018-09-19 (×2): qty 1

## 2018-09-19 MED ORDER — GLUCERNA SHAKE PO LIQD
237.0000 mL | Freq: Three times a day (TID) | ORAL | Status: DC
  Administered 2018-09-19 – 2018-09-20 (×3): 237 mL via ORAL

## 2018-09-19 MED ORDER — PRO-STAT SUGAR FREE PO LIQD
30.0000 mL | Freq: Two times a day (BID) | ORAL | Status: DC
  Administered 2018-09-19 – 2018-09-20 (×3): 30 mL via ORAL
  Filled 2018-09-19 (×3): qty 30

## 2018-09-19 MED ORDER — PRUCALOPRIDE SUCCINATE 2 MG PO TABS
2.0000 mg | ORAL_TABLET | Freq: Every day | ORAL | Status: DC
  Administered 2018-09-19 – 2018-09-20 (×2): 2 mg via ORAL
  Filled 2018-09-19: qty 1

## 2018-09-19 NOTE — Plan of Care (Signed)
  Problem: Acute Rehab PT Goals(only PT should resolve) Goal: Pt Will Go Supine/Side To Sit Outcome: Progressing Flowsheets (Taken 09/19/2018 1420) Pt will go Supine/Side to Sit: with modified independence Goal: Patient Will Transfer Sit To/From Stand Outcome: Progressing Flowsheets (Taken 09/19/2018 1420) Patient will transfer sit to/from stand: with min guard assist Goal: Pt Will Transfer Bed To Chair/Chair To Bed Outcome: Progressing Flowsheets (Taken 09/19/2018 1420) Pt will Transfer Bed to Chair/Chair to Bed: min guard assist Goal: Pt Will Ambulate Outcome: Progressing Flowsheets (Taken 09/19/2018 1420) Pt will Ambulate: 75 feet; with min guard assist; with rolling walker   2:21 PM, 09/19/18 Ocie Bob, MPT Physical Therapist with [facility] [phone] office [phone] mobile phone

## 2018-09-19 NOTE — Care Management Important Message (Signed)
Important Message  Patient Details  Name: [name] MRN: [id number] Date of Birth: 1951/03/15   Medicare Important Message Given:  Yes    [name] 09/19/2018, 1:56 PM

## 2018-09-19 NOTE — Progress Notes (Signed)
[facility]  Patient seen and doing well. Aunt in room. Eating regular diet. No complaints. Distended but soft, baseline.  Dr. Karilyn Cota has started prucalopride 2mg  daily.  RN documented BMs yesterday.  No obstruction, agree dysmotility issue. Hopefully the medication can help with the motility and distention. No surgical intervention indicated.  On flagyl for bacterial overgrowth too.   Appreciate Dr. Sherryll Burger and Dr. Patty Sermons assistance with this complicated patient. Would think he could transfer back to SNF in next 24 hrs. Would stress need for patient to get to toilet to evacuate air and gas, and stress that he is distended at baseline. Continue simethicone QID.    I will see him as scheduled to further discuss the cholecystostomy tube options.  Future Appointments  Date Time Provider Department Center  09/24/2018 11:30 AM Len Blalock, NP NRE-NRE None  11/04/2018 10:00 AM [name], MD RS-RS None   Algis Greenhouse, MD [facility] [address] [place] [place], [place] [address] [phone] (office)

## 2018-09-19 NOTE — Progress Notes (Signed)
  Patient will be started on prucalopride 2 mg by mouth daily. 7-day supply of samples provided to pharmacy to be labeled. Discussed off label use of this medication with patient's aunt Ms. Lavinia Sharps who is at bedside and she agrees.  Dr. Jena Gauss will be seeing patient over the weekend.

## 2018-09-19 NOTE — Progress Notes (Addendum)
Initial Nutrition Assessment  RD working remotely.   DOCUMENTATION CODES:      INTERVENTION:  -Glucerna Shake po TID, each supplement provides 220 kcal and 10 grams of protein   -ProStat 30 ml BID (each 30 ml provides 100 kcal, 15 gr protein)   -1 packet Juven BID, each packet provides 95 calories, 2.5 grams of protein (collagen), and 9.8 grams of carbohydrate (3 grams sugar); also contains 7 grams of L-arginine and L-glutamine, 300 mg vitamin C, 15 mg vitamin E, 1.2 mcg vitamin B-12, 9.5 mg zinc, 200 mg calcium, and 1.5 g  Calcium Beta-hydroxy-Beta-methylbutyrate to support wound healing   NUTRITION DIAGNOSIS:   Increased nutrient needs related to wound healing as evidenced by estimated needs.  GOAL:   Patient will meet greater than or equal to 90% of their needs  MONITOR:   PO intake, Supplement acceptance, Skin, Labs, Weight trends  REASON FOR ASSESSMENT:   Malnutrition Screening Tool    ASSESSMENT: Patient is a 68 yo male with intellectual disability. H/O gangrenous cholecystitis (has cholecestectomy tube) and more recently SBO which required exploratory lap (08/26/18) and pt returned to hospital with suspected ileus 10 days later. Patient presents from [facility] with abdominal distention. CT- and Small Bowel follow through performed. Acute on chronic pseudo-GI obstruction identified.   Patient is tolerating diet advancement and ate very well -intake at 100% of lunch meal. Able to feed himself. Patient has been eating a soft/therapeutic (NAS/Consistent CHO) diet drinking Glucerna TID at facility with variable acceptance.   His usual weight range between 155-160 lb. He has lost 5-10 lb below baseline. At risk for malnutrition with altered po intake and acute on chronic GI problems.   Medications reviewed and include: Proscar, Dulcolax, Mag-Ox, Flagyl, phosphorus and Rocephin.  Labs: BMP Latest Ref Rng & Units 09/19/2018 09/18/2018 09/15/2018  Glucose 70 - 99 mg/dL 290(S) 111(B)  520(E)  BUN 8 - 23 mg/dL 02(M) 21 14  Creatinine 0.61 - 1.24 mg/dL 3.36 1.22 4.49  Sodium 135 - 145 mmol/L 135 132(L) 136  Potassium 3.5 - 5.1 mmol/L 5.1 4.9 4.5  Chloride 98 - 111 mmol/L 103 93(L) 99  CO2 22 - 32 mmol/L 26 26 27   Calcium 8.9 - 10.3 mg/dL 9.4 9.9 9.3     NUTRITION - FOCUSED PHYSICAL EXAM:  Unable to complete Nutrition-Focused physical exam at this time.    Diet Order:   Diet Order            Diet Heart Room service appropriate? Yes; Fluid consistency: Thin  Diet effective now              EDUCATION NEEDS:  Not appropriate for education at this time(intellectual disability)   Skin:  Skin Assessment: Skin Integrity Issues: Skin Integrity Issues:: Unstageable, Stage II Stage II: right buttock  Unstageable: left heel  Last BM:  6/5  Height:   Ht Readings from Last 1 Encounters:  09/17/18 5\' 7"  (1.702 m)    Weight:   Wt Readings from Last 1 Encounters:  09/19/18 68.3 kg    Ideal Body Weight:  67 kg  BMI:  Body mass index is 23.58 kg/m.  Estimated Nutritional Needs:   Kcal:  7530-0511 (29-33 kcal/kg/bw)  Protein:  88-95 (1.3-1.4 gr/kg/bw)  Fluid:  >2 liters daily   Royann Shivers MS,RD,CSG,LDN Office: [phone] Pager: [phone]

## 2018-09-19 NOTE — Progress Notes (Signed)
Ate about 100% of lunch today and about 50% of supper.  Aunt stated that he patted his stomach that he was full after eating supper.  No vomiting.  Also, sat on toilet today.

## 2018-09-19 NOTE — Progress Notes (Signed)
  Subjective:  Patient has no complaints.  He says he would like to eat real food.  No abdominal pain reported. According to his aunt he did not have any difficulty with liquids. He did have a bowel movement earlier today.  Objective: Blood pressure (!) 117/52, pulse 95, temperature 98.3 F (36.8 C), temperature source Oral, resp. rate 16, height 5' 7" (1.702 m), weight 68.3 kg, SpO2 97 %.  Patient is alert and in no acute distress. Abdomen is less distended with normal bowel sounds.  Palpation it is softer than it was yesterday.  No organomegaly masses or tenderness.   Labs/studies Results:  CBC Latest Ref Rng & Units 09/19/2018 09/18/2018 09/15/2018  WBC 4.0 - 10.5 K/uL 6.2 9.2 5.7  Hemoglobin 13.0 - 17.0 g/dL 10.2(L) 12.0(L) 9.8(L)  Hematocrit 39.0 - 52.0 % 32.3(L) 38.2(L) 31.6(L)  Platelets 150 - 400 K/uL 276 356 327    CMP Latest Ref Rng & Units 09/19/2018 09/18/2018 09/15/2018  Glucose 70 - 99 mg/dL 152(H) 214(H) 148(H)  BUN 8 - 23 mg/dL 25(H) 21 14  Creatinine 0.61 - 1.24 mg/dL 1.08 1.21 1.00  Sodium 135 - 145 mmol/L 135 132(L) 136  Potassium 3.5 - 5.1 mmol/L 5.1 4.9 4.5  Chloride 98 - 111 mmol/L 103 93(L) 99  CO2 22 - 32 mmol/L _0 Calcium 8.9 - 10.3 mg/dL 9.4 9.9 9.3  Total Protein 6.5 - 8.1 g/dL 7.6 8.6(H) -  Total Bilirubin 0.3 - 1.2 mg/dL 0.5 0.3 -  Alkaline Phos 38 - 126 U/L 59 75 -  AST 15 - 41 U/L 16 22 -  ALT 0 - 44 U/L 20 24 -    Hepatic Function Latest Ref Rng & Units 09/19/2018 09/18/2018 09/05/2018  Total Protein 6.5 - 8.1 g/dL 7.6 8.6(H) 7.1  Albumin 3.5 - 5.0 g/dL 3.5 4.0 4.1  AST 15 - 41 U/L 16 22 12(L)  ALT 0 - 44 U/L _1 Alk Phosphatase 38 - 126 U/L 59 75 52  Total Bilirubin 0.3 - 1.2 mg/dL 0.5 0.3 0.5  Bilirubin, Direct 0.0 - 0.2 mg/dL - - -     Assessment:  #1.  Acute on chronic pseudo-GI obstruction.  Significant improvement in examination since seen yesterday.  Abdominal distention has decreased and his bowels are moving.  At this point I do not  see any need for neostigmine but he would be a candidate for prucalopride ([employer]) which is not on our formulary.  If he continues to do well he may not need further therapy.   Recommendations:  Advance diet to heart healthy diet. Acute abdominal series in a.m. PT consultation has been requested in order to help patient back to his baseline of ambulation.

## 2018-09-19 NOTE — Evaluation (Signed)
Physical Therapy Evaluation Patient Details Name: [name] MRN: [id number] DOB: [birth year] Today's Date: 09/19/2018   History of Present Illness  [name]  is a 68 y.o. male,w intellectual disability, dm2, Bph, h/o acute gangrenous cholecystitis s/p percutaneous cholecystostomy 05/25/2018, w admission for SBO s/p exp lap w reduction of volvulus 08/26/2018, w admission for Ileus 09/05/2018 presents from SNF for evaluation of ? SBO.  Pt is a poor historian and unable to give any meaningful history.      Clinical Impression  Patient limited for functional mobility as stated below secondary to BLE weakness, fatigue and poor standing balance.  Patient tends to lean over RW pushing it to far in front requiring verbal/tactile cueing to avoid loss of balance.  Patient tolerated sitting up in chair after therapy.   Patient will benefit from continued physical therapy in hospital and recommended venue below to increase strength, balance, endurance for safe ADLs and gait.     Follow Up Recommendations SNF;Supervision/Assistance - 24 hour;Supervision for mobility/OOB    Equipment Recommendations  None recommended by PT    Recommendations for Other Services       Precautions / Restrictions Precautions Precautions: Fall Restrictions Weight Bearing Restrictions: No      Mobility  Bed Mobility Overal bed mobility: Needs Assistance Bed Mobility: Supine to Sit     Supine to sit: Supervision     General bed mobility comments: with head of bed raised  Transfers Overall transfer level: Needs assistance Equipment used: Rolling walker (2 wheeled) Transfers: Sit to/from [employer] CorporationStand;Stand Pivot Transfers Sit to Stand: Min assist Stand pivot transfers: Min assist       General transfer comment: slightly labored movement  Ambulation/Gait Ambulation/Gait assistance: Min assist;Mod assist Gait Distance (Feet): 25 Feet Assistive device: Rolling walker (2 wheeled) Gait Pattern/deviations:  Decreased step length - right;Decreased stride length Gait velocity: decreased   General Gait Details: labored cadence with tendency to push RW to far in front requiring verbal/tactile cueing to step closer to RW, no loss of balance, limited secondary to fatigue  Stairs            Wheelchair Mobility    Modified Rankin (Stroke Patients Only)       Balance Overall balance assessment: Needs assistance Sitting-balance support: Feet supported;No upper extremity supported Sitting balance-[name]: Fair Sitting balance - Comments: fair/good   Standing balance support: During functional activity;Bilateral upper extremity supported Standing balance-[name]: Fair Standing balance comment: using RW                             Pertinent Vitals/Pain Pain Assessment: No/denies pain    Home Living Family/patient expects to be discharged to:: Private residence Living Arrangements: Other relatives Available Help at Discharge: Family Type of Home: Mobile home Home Access: Stairs to enter Entrance Stairs-Rails: Right;Left;Can reach both Entrance Stairs-Number of Steps: 3-4 Home Layout: One level Home Equipment: Walker - 2 wheels;Wheelchair - manual      Prior Function Level of Independence: Independent with assistive device(s)         Comments: household and short distanced community ambulator with RW     Hand Dominance        Extremity/Trunk Assessment   Upper Extremity Assessment Upper Extremity Assessment: Generalized weakness    Lower Extremity Assessment Lower Extremity Assessment: Generalized weakness    Cervical / Trunk Assessment Cervical / Trunk Assessment: Kyphotic  Communication   Communication: Other (comment)  Cognition Arousal/Alertness:  Awake/alert Behavior During Therapy: WFL for tasks assessed/performed Overall Cognitive Status: History of cognitive impairments - at baseline                                  General Comments: pt non-verbal though answered questions with node/shake; follows directions consistently      General Comments      Exercises     Assessment/Plan    PT Assessment Patient needs continued PT services  PT Problem List Decreased strength;Decreased activity tolerance;Decreased balance;Decreased mobility       PT Treatment Interventions Therapeutic exercise;Gait training;Stair training;Functional mobility training;Therapeutic activities;Patient/family education    PT Goals (Current goals can be found in the Care Plan section)  Acute Rehab PT Goals Patient Stated Goal: Return home with family to assist PT Goal Formulation: With patient/family Time For Goal Achievement: 10/03/18 Potential to Achieve Goals: Good    Frequency Min 3X/week   Barriers to discharge        Co-evaluation               AM-PAC PT "6 Clicks" Mobility  Outcome Measure Help needed turning from your back to your side while in a flat bed without using bedrails?: None Help needed moving from lying on your back to sitting on the side of a flat bed without using bedrails?: None Help needed moving to and from a bed to a chair (including a wheelchair)?: A Little Help needed standing up from a chair using your arms (e.g., wheelchair or bedside chair)?: A Little Help needed to walk in hospital room?: A Lot Help needed climbing 3-5 steps with a railing? : A Lot 6 Click Score: 18    End of Session   Activity Tolerance: Patient tolerated treatment well;Patient limited by fatigue Patient left: in chair;with call bell/phone within reach;with chair alarm set;with family/visitor present Nurse Communication: Mobility status PT Visit Diagnosis: Unsteadiness on feet (R26.81);Other abnormalities of gait and mobility (R26.89);Muscle weakness (generalized) (M62.81)    Time: 1034-1100 PT Time Calculation (min) (ACUTE ONLY): 26 min   Charges:   PT Evaluation $PT Eval Moderate Complexity: 1  Mod PT Treatments $Therapeutic Activity: 23-37 mins        2:20 PM, 09/19/18 Ocie Bob, MPT Physical Therapist with [facility] [phone] office [phone] mobile phone

## 2018-09-19 NOTE — Progress Notes (Signed)
PROGRESS NOTE    [name]  ZOX:[id number] DOB: 03-07-1951 DOA: 09/18/2018 PCP: Pearson Grippe, MD   Brief Narrative:  Per HPI: [name] a67 y.o.male,w intellectual disability, dm2, Bph, h/o acute gangrenous cholecystitis s/p percutaneous cholecystostomy 05/25/2018, w admission for SBO s/p exp lap w reduction of volvulus 08/26/2018, w admission for Ileus 09/05/2018 presents from SNF for evaluation of ? SBO. Pt is a poor historian and unable to give any meaningful history.   Review of records shows that pt was seen by surgery on 09/16/2018 => slight erythema around staple line and started on Bactrim. Cont w cholecystostomy tube  Patient has CT of the abdomen and pelvis in the ED with noted diffusely dilated and air-filled loops of small bowel similar to prior CT which was thought to represent ileus.  Small bowel follow-through was performed on 6/4 with some dilated loops of bowel noted.  He has been seen by general surgery as well as GI and there are thoughts that patient has acute on chronic pseudo-GI obstruction.  His abdominal distention has decreased and his bowels are now beginning to move on 6/5, and therefore diet was advanced to heart healthy.  Assessment & Plan:   Principal Problem:   Abdominal distension Active Problems:   HTN (hypertension)   Diabetes mellitus type 2, noninsulin dependent (HCC)   Hyponatremia   Ileus (HCC)   Acute lower UTI   Abdominal distention  Abdominal distention likely secondary to acute on chronic pseudo-GI obstruction Appreciate GI and general surgery management Diet advanced to heart healthy today Abdominal series in a.m. Flagyl started by GI for SIBO coverage as patient is high risk for this Continue on Mylicon  Acute UTI versus pyuria Urine culture does not appear to have been obtained and will try to obtain repeat today Rocephin 1gm iv qday for now  Dm2-stable DC Metformin fsbs q4h, ISS  H/o Hypokalemia,  Hypomagnesemia-resolved Avoid further supplementation and recheck labs  Hyponatremia-resolved Continue to monitor and repeat labs  Anemia-downtrending with no overt bleed Check cbc in am Likely hemodilutional  Bph Cont Finasteride  po qday Cont Flomax 0.4mg  po qhs  H/o gangrenous cholecystitis s/p percutaneous cholecystostomy monitor   DVT prophylaxis: Lovenox Code Status: Full Family Communication: Legal guardian, [name] at bedside Disposition Plan: Abdominal series in a.m. per GI with diet advanced today.  PT evaluation ordered and pending.   Consultants:   GI  GS  Procedures:   Small bowel follow-through on 6/4  Antimicrobials:  Anti-infectives (From admission, onward)   Start     Dose/Rate Route Frequency Ordered Stop   09/18/18 2200  metroNIDAZOLE (FLAGYL) tablet 250 mg     250 mg Oral 3 times daily 09/18/18 1951            Subjective: Patient seen and evaluated today with no new acute complaints or concerns. No acute concerns or events noted overnight according to legal guardian.  Appears to be tolerating diet and did have a bowel movement earlier today.  Objective: Vitals:   09/18/18 1532 09/18/18 2154 09/19/18 0500 09/19/18 0545  BP: 113/74 132/70  (!) 117/52  Pulse: (!) 107 (!) 101  95  Resp: Temp: 98.3 F (36.8 C) 98.7 F (37.1 C)  98.3 F (36.8 C)  TempSrc: Oral Oral  Oral  SpO2: 97% 97%  97%  Weight: 66.5 kg  68.3 kg   Height:        Intake/Output Summary (Last 24 hours) at 09/19/2018 1109 Last data  filed at 09/19/2018 0700 Gross per 24 hour  Intake 730 ml  Output 3075 ml  Net -2345 ml   Filed Weights   09/17/18 2353 09/18/18 1532 09/19/18 0500  Weight: 74 kg 66.5 kg 68.3 kg    Examination:  General exam: Appears calm and comfortable  Respiratory system: Clear to auscultation. Respiratory effort normal. Cardiovascular system: S1 & S2 heard, RRR. No JVD, murmurs, rubs, gallops or clicks. No pedal  edema. Gastrointestinal system: Abdomen is minimally distended, soft and nontender. No organomegaly or masses felt. Normal bowel sounds heard.  Cholecystostomy tube present. Central nervous system: Alert and awake Extremities: Symmetric 5 x 5 power. Skin: No rashes, lesions or ulcers Psychiatry: Difficult to assess    Data Reviewed: I have personally reviewed following labs and imaging studies  CBC: Recent Labs  Lab 09/15/18 0700 09/18/18 0132 09/19/18 0634  WBC 5.7 9.2 6.2  NEUTROABS  --  7.4  --   HGB 9.8* 12.0* 10.2*  HCT 31.6* 38.2* 32.3*  MCV 92.1 89.7 90.5  PLT 327 356 276   Basic Metabolic Panel: Recent Labs  Lab 09/15/18 0700 09/18/18 0132 09/18/18 0717 09/19/18 0634  NA 136 132*  --  135  K 4.5 4.9  --  5.1  CL 99 93*  --  103  CO2 27 26  --  26  GLUCOSE 148* 214*  --  152*  BUN 14 21  --  25*  CREATININE 1.00 1.21  --  1.08  CALCIUM 9.3 9.9  --  9.4  MG 1.9 2.0  --   --   PHOS  --   --  4.0  --    GFR: Estimated Creatinine Clearance: 61.2 mL/min (by C-G formula based on SCr of 1.08 mg/dL). Liver Function Tests: Recent Labs  Lab 09/18/18 0132 09/19/18 0634  AST 22 16  ALT 24 20  ALKPHOS 75 59  BILITOT 0.3 0.5  PROT 8.6* 7.6  ALBUMIN 4.0 3.5   Recent Labs  Lab 09/18/18 0132  LIPASE 35   No results for input(s): AMMONIA in the last 168 hours. Coagulation Profile: No results for input(s): INR, PROTIME in the last 168 hours. Cardiac Enzymes: No results for input(s): CKTOTAL, CKMB, CKMBINDEX, TROPONINI in the last 168 hours. BNP (last 3 results) No results for input(s): PROBNP in the last 8760 hours. HbA1C: No results for input(s): HGBA1C in the last 72 hours. CBG: No results for input(s): GLUCAP in the last 168 hours. Lipid Profile: No results for input(s): CHOL, HDL, LDLCALC, TRIG, CHOLHDL, LDLDIRECT in the last 72 hours. Thyroid Function Tests: No results for input(s): TSH, T4TOTAL, FREET4, T3FREE, THYROIDAB in the last 72  hours. Anemia Panel: No results for input(s): VITAMINB12, FOLATE, FERRITIN, TIBC, IRON, RETICCTPCT in the last 72 hours. Sepsis Labs: Recent Labs  Lab 09/18/18 0132  LATICACIDVEN 1.4    Recent Results (from the past 240 hour(s))  SARS Coronavirus 2 [facility] order, Performed in [facility] hospital lab)     Status: None   Collection Time: 09/16/18 10:30 AM  Result Value Ref Range Status   SARS Coronavirus 2 NEGATIVE NEGATIVE Final    Comment: (NOTE) If result is NEGATIVE SARS-CoV-2 target nucleic acids are NOT DETECTED. The SARS-CoV-2 RNA is generally detectable in upper and lower  respiratory specimens during the acute phase of infection. The lowest  concentration of SARS-CoV-2 viral copies this assay can detect is 250  copies / mL. A negative result does not preclude SARS-CoV-2 infection  and should  not be used as the sole basis for treatment or other  patient management decisions.  A negative result [name] occur with  improper specimen collection / handling, submission of specimen other  than nasopharyngeal swab, presence of viral mutation(s) within the  areas targeted by this assay, and inadequate number of viral copies  (<250 copies / mL). A negative result must be combined with clinical  observations, patient history, and epidemiological information. If result is POSITIVE SARS-CoV-2 target nucleic acids are DETECTED. The SARS-CoV-2 RNA is generally detectable in upper and lower  respiratory specimens dur ing the acute phase of infection.  Positive  results are indicative of active infection with SARS-CoV-2.  Clinical  correlation with patient history and other diagnostic information is  necessary to determine patient infection status.  Positive results do  not rule out bacterial infection or co-infection with other viruses. If result is PRESUMPTIVE POSTIVE SARS-CoV-2 nucleic acids [name] BE PRESENT.   A presumptive positive result was obtained on the submitted specimen  and  confirmed on repeat testing.  While 2019 novel coronavirus  (SARS-CoV-2) nucleic acids [name] be present in the submitted sample  additional confirmatory testing [name] be necessary for epidemiological  and / or clinical management purposes  to differentiate between  SARS-CoV-2 and other Sarbecovirus currently known to infect humans.  If clinically indicated additional testing with an alternate test  methodology [phone]([id number]) is advised. The SARS-CoV-2 RNA is generally  detectable in upper and lower respiratory sp ecimens during the acute  phase of infection. The expected result is Negative. Fact Sheet for Patients:  BoilerBrush.com.cyhttps://www.fda.gov/media/136312/download Fact Sheet for Healthcare Providers: https://pope.com/https://www.fda.gov/media/136313/download This test is not yet approved or cleared by the [place] FDA and has been authorized for detection and/or diagnosis of SARS-CoV-2 by FDA under an Emergency Use Authorization (EUA).  This EUA will remain in effect (meaning this test can be used) for the duration of the COVID-19 declaration under Section 564(b)(1) of the Act, 21 U.S.C. section 360bbb-3(b)(1), unless the authorization is terminated or revoked sooner. Performed at [facility], [address] [place], [place] [address]   SARS Coronavirus 2 (CEPHEID - Performed in [facility] hospital lab), Hosp Order     Status: None   Collection Time: 09/18/18  2:02 AM  Result Value Ref Range Status   SARS Coronavirus 2 NEGATIVE NEGATIVE Final    Comment: (NOTE) If result is NEGATIVE SARS-CoV-2 target nucleic acids are NOT DETECTED. The SARS-CoV-2 RNA is generally detectable in upper and lower  respiratory specimens during the acute phase of infection. The lowest  concentration of SARS-CoV-2 viral copies this assay can detect is 250  copies / mL. A negative result does not preclude SARS-CoV-2 infection  and should not be used as the sole basis for treatment or other  patient management decisions.  A negative  result [name] occur with  improper specimen collection / handling, submission of specimen other  than nasopharyngeal swab, presence of viral mutation(s) within the  areas targeted by this assay, and inadequate number of viral copies  (<250 copies / mL). A negative result must be combined with clinical  observations, patient history, and epidemiological information. If result is POSITIVE SARS-CoV-2 target nucleic acids are DETECTED. The SARS-CoV-2 RNA is generally detectable in upper and lower  respiratory specimens dur ing the acute phase of infection.  Positive  results are indicative of active infection with SARS-CoV-2.  Clinical  correlation with patient history and other diagnostic information is  necessary to determine patient infection status.  Positive results do  not rule out bacterial infection or co-infection with other viruses. If result is PRESUMPTIVE POSTIVE SARS-CoV-2 nucleic acids [name] BE PRESENT.   A presumptive positive result was obtained on the submitted specimen  and confirmed on repeat testing.  While 2019 novel coronavirus  (SARS-CoV-2) nucleic acids [name] be present in the submitted sample  additional confirmatory testing [name] be necessary for epidemiological  and / or clinical management purposes  to differentiate between  SARS-CoV-2 and other Sarbecovirus currently known to infect humans.  If clinically indicated additional testing with an alternate test  methodology [phone]) is advised. The SARS-CoV-2 RNA is generally  detectable in upper and lower respiratory sp ecimens during the acute  phase of infection. The expected result is Negative. Fact Sheet for Patients:  BoilerBrush.com.cy Fact Sheet for Healthcare Providers: https://pope.com/ This test is not yet approved or cleared by the [place] FDA and has been authorized for detection and/or diagnosis of SARS-CoV-2 by FDA under an Emergency Use Authorization  (EUA).  This EUA will remain in effect (meaning this test can be used) for the duration of the COVID-19 declaration under Section 564(b)(1) of the Act, 21 U.S.C. section 360bbb-3(b)(1), unless the authorization is terminated or revoked sooner. Performed at [facility], [address], [place], [place] [address]          Radiology Studies: Ct Abdomen Pelvis W Contrast  Result Date: 09/18/2018 CLINICAL DATA:  68 year old male with abdominal pain and distention. Rule out bowel obstruction. EXAM: CT ABDOMEN AND PELVIS WITH CONTRAST TECHNIQUE: Multidetector CT imaging of the abdomen and pelvis was performed using the standard protocol following bolus administration of intravenous contrast. CONTRAST:  OMNIPAQUE IOHEXOL 300 MG/ML  SOLN COMPARISON:  Abdominal radiograph dated 09/06/2018 and CT dated 09/05/2018 FINDINGS: Lower chest: The visualized lung bases are clear. No intra-abdominal free air or free fluid. Hepatobiliary: The liver is unremarkable. No intrahepatic biliary ductal dilatation. The gallbladder is contracted around a percutaneous cholecystostomy. Pancreas: Unremarkable. No pancreatic ductal dilatation or surrounding inflammatory changes. Spleen: Normal in size without focal abnormality. Adrenals/Urinary Tract: The adrenal glands are unremarkable. Several nonobstructing left renal calculi measure up to 15 mm in the interpolar left kidney. There is no hydronephrosis on either side. There is symmetric enhancement and excretion of contrast by both kidneys. The visualized ureters appear unremarkable. The urinary bladder is decompressed around a Foley catheter. Stomach/Bowel: There is diffuse circumferential thickening of the visualized distal esophagus which [name] represent esophagitis secondary to recurrent reflux. An infiltrative process is not entirely excluded. There is moderate to severe distention of the stomach with air and content. There is diffuse dilatation of air-filled loops of  small bowel measuring up to 7.5 cm in diameter in the anterior abdomen. Findings are somewhat similar to prior CT and [name] represent an ileus. There are however multiple normal caliber loops of small bowel in the left lower abdomen and therefore a degree of obstruction is not entirely excluded. Small-bowel series [name] provide better evaluation to exclude a mechanical obstruction. There is also diffuse air distention of the colon. There is no pneumatosis. The appendix is not visualized with certainty. Vascular/Lymphatic: The abdominal aorta and IVC appear unremarkable. No portal venous gas. There is no adenopathy. Reproductive: Multiple surgical clips in the region of the prostate gland. Other: None Musculoskeletal: Degenerative changes of the spine. No acute osseous pathology. IMPRESSION: 1. Diffusely dilated and air-filled loops of small bowel similar to prior CT and [name] represent an ileus. A degree of small-bowel  obstruction is not entirely excluded. Small-bowel series [name] provide better evaluation. Clinical correlation and follow-up recommended. 2. No free air or pneumatosis.  No portal venous gas. 3. Second frontal thickening of the distal esophagus [name] represent esophagitis although infiltrative mass is not excluded. Clinical correlation is recommended. 4. Percutaneous cholecystostomy with contracted gallbladder. 5. Nonobstructing left renal calculi. No hydronephrosis. Electronically Signed   By: Elgie Collard M.D.   On: 09/18/2018 04:08   Dg Small Bowel W Single Cm (sol Or Thin Ba)  Result Date: 09/19/2018 CLINICAL DATA:  Abdominal distension, chronic bowel dilatation, question obstruction EXAM: SMALL BOWEL SERIES COMPARISON:  CT abdomen and pelvis 09/18/2018 TECHNIQUE: Following ingestion of thin barium, serial small bowel images were obtained including spot views of the terminal ileum. FLUOROSCOPY TIME:  Fluoroscopy Time:  None Radiation Exposure Index (if provided by the fluoroscopic device): 8.9 mGy  Number of Acquired Spot Images: 8 FINDINGS: Cholecystostomy tube RIGHT upper quadrant. Extensive gaseous distention of large and small bowel loops throughout abdomen. Gaseous distention of stomach. Stool in LEFT colon. Multiple large LEFT renal calculi. Bones demineralized. Poor emptying of contrast from the stomach initially likely due to supine positioning. Images were obtained for nearly 23 hours. Images demonstrate gradual emptying of contrast from the stomach into small bowel loops, which appears significantly dilated. Proximal small bowel loops are located in the RIGHT upper quadrant suggesting at least a partial small bowel mal rotation. At 7 hours contrast has entered the RIGHT colon. Delayed image at 22.75 hours shows significant contrast throughout the colon with residual contrast identified within both dilated and nondilated small bowel loops in the mid abdomen. No definite bowel wall thickening or evidence of obstruction. Decreased gaseous distention of the colon at 22.75 hours. IMPRESSION: Significantly dilated small bowel loops though no evidence of bowel obstruction is identified. Cholelithiasis. Electronically Signed   By: Ulyses Southward M.D.   On: 09/19/2018 08:48        Scheduled Meds:  aspirin EC  81 mg Oral q morning - 10a   bisacodyl  10 mg Rectal Once   enoxaparin (LOVENOX) injection  40 mg Subcutaneous Q24H   finasteride  5 mg Oral Daily   magnesium oxide  400 mg Oral Daily   metroNIDAZOLE  250 mg Oral TID   phosphorus  250 mg Oral Daily   simethicone  40 mg Oral QID   tamsulosin  0.4 mg Oral QPM   Continuous Infusions:   LOS: 1 day    Time spent: 30 minutes    Alexsys Eskin Hoover Brunette, DO [facility] Pager [phone]  If 7PM-7AM, please contact night-coverage [email or web address] Password [email or web address] 09/19/2018, 11:09 AM

## 2018-09-19 NOTE — TOC Initial Note (Signed)
Transition of Care [facility](TOC) - Initial/Assessment Note    Patient Details  Name: [name] MRN: [id number] Date of Birth: 1951/01/29  Transition of Care [facility](TOC) CM/SW Contact:    Annice NeedySettle, Carmellia Kreisler D, LCSW Phone Number: 09/19/2018, 6:41 PM  Clinical Narrative:                 [facility]. He will return at discharge.  Aunt, Ms. Long, confirmed discharge plan. [facility].   Expected Discharge Plan: Skilled Nursing Facility Barriers to Discharge: No Barriers Identified   Patient Goals and CMS Choice Patient states their goals for this hospitalization and ongoing recovery are:: rehab  CMS Medicare.gov Compare Post Acute Care list provided to:: Other (Comment Required)([name], aunt)    Expected Discharge Plan and Services Expected Discharge Plan: Skilled Nursing Facility         Expected Discharge Date: 09/21/18                                    Prior Living Arrangements/Services   Lives with:: Relatives   Do you feel safe going back to the place where you live?: Yes               Activities of Daily Living Home Assistive Devices/Equipment: None ADL Screening (condition at time of admission) Patient's cognitive ability adequate to safely complete daily activities?: No Is the patient deaf or have difficulty hearing?: No Does the patient have difficulty seeing, even when wearing glasses/contacts?: No Does the patient have difficulty concentrating, remembering, or making decisions?: Yes Patient able to express need for assistance with ADLs?: No Does the patient have difficulty dressing or bathing?: Yes Independently performs ADLs?: No Communication: Needs assistance Is this a change from baseline?: Pre-admission baseline Dressing (OT): Dependent Is this a change from baseline?: Pre-admission baseline Grooming: Dependent Is this a change from baseline?: Pre-admission  baseline Feeding: Dependent Is this a change from baseline?: Pre-admission baseline Bathing: Dependent Is this a change from baseline?: Pre-admission baseline Toileting: Dependent Is this a change from baseline?: Pre-admission baseline In/Out Bed: Dependent Is this a change from baseline?: Pre-admission baseline Walks in Home: Dependent Is this a change from baseline?: Pre-admission baseline Does the patient have difficulty walking or climbing stairs?: Yes Weakness of Legs: Both Weakness of Arms/Hands: None  Permission Sought/Granted Permission sought to share information with : Other (comment)          Permission granted to share info w Relationship: Otelia SergeantGeneva Long, aunt     Emotional Assessment              Admission diagnosis:  Abdominal distention [R14.0] Hyponatremia [E87.1] Ileus (HCC) [K56.7] Normochromic normocytic anemia [D64.9] Patient Active Problem List   Diagnosis Date Noted  . Acute lower UTI 09/18/2018  . Abdominal distention 09/18/2018  . Hypomagnesemia 09/09/2018  . Protein-calorie malnutrition, severe (HCC) 09/09/2018  . Acute blood loss anemia 09/09/2018  . Gangrenous cholecystitis 09/05/2018  . Volvulus (HCC) 09/03/2018  . Ileus (HCC)   . Pneumatosis of intestines   . Gall bladder disease 08/25/2018  . Small bowel ischemia (HCC)   . Acute gangrenous cholecystitis 05/24/2018  . SBO (small bowel obstruction) (HCC) 05/24/2018  . Abdominal distension   . Pressure injury of skin 04/09/2018  . C. difficile diarrhea 04/08/2018  . Bilateral hydronephrosis 04/08/2018  . Pneumoperitoneum  of unknown etiology   . Diarrhea 04/07/2018  . Nonverbal 04/07/2018  . Hydronephrosis with urinary obstruction due to ureteral calculus 06/20/2016  . Sepsis secondary to UTI (HCC) 06/20/2016  . Fever 06/19/2016  . Generalized weakness 06/19/2016  . Diabetes mellitus type 2, noninsulin dependent (HCC) 06/19/2016  . Severe sepsis with septic shock (HCC) 06/19/2016   . UTI (urinary tract infection) 06/19/2016  . Hematuria 06/19/2016  . AKI (acute kidney injury) (HCC) 06/19/2016  . Hyponatremia 06/19/2016  . Hypokalemia 06/19/2016  . BPH (benign prostatic hyperplasia) 06/19/2016  . 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 Urinary retention 05/07/2014  . Intellectual disability 05/07/2014  . Hyperlipidemia 05/07/2014  . HTN (hypertension) 05/07/2014   PCP:  Pearson Grippe, MD Pharmacy:   [facility] [phone] - [place], [place] - [address] AT [facility] OF [address] [place] [address] Phone: [phone] Fax: [phone]  [facility] [place], [place] - [address] [place] [address] Phone: [phone] Fax: [phone]     Social Determinants of Health (SDOH) Interventions    Readmission Risk Interventions Readmission Risk Prevention Plan 09/19/2018 08/27/2018  Transportation Screening - Complete  Medication Review (RN Care Manager) Complete -  PCP or Specialist appointment within 3-5 days of discharge Not Complete -  PCP/Specialist Appt Not Complete comments SNF resident. Sees facility MD. -  HRI or Home Care Consult Not Complete -  HRI or Home Care Consult Pt Refusal Comments Patient is in SNF but was active with [facility] prior to SNF. -  SW Recovery Care/Counseling Consult Complete -  Palliative Care Screening Not Applicable -  Skilled Nursing Facility Complete -  Some recent data might be hidden

## 2018-09-20 ENCOUNTER — Inpatient Hospital Stay (HOSPITAL_COMMUNITY): Payer: Medicare Other

## 2018-09-20 ENCOUNTER — Inpatient Hospital Stay
Admission: RE | Admit: 2018-09-20 | Discharge: 2018-09-25 | Disposition: A | Discharge status: Nursing Home (Includes ICF) | Payer: Medicare Other | Source: Ambulatory Visit | Attending: Internal Medicine | Admitting: Internal Medicine

## 2018-09-20 DIAGNOSIS — R140 Abdominal distension (gaseous): Secondary | ICD-10-CM | POA: Diagnosis not present

## 2018-09-20 LAB — COMPREHENSIVE METABOLIC PANEL
ALT: 22 U/L (ref 0–44)
AST: 22 U/L (ref 15–41)
Albumin: 3.5 g/dL (ref 3.5–5.0)
Alkaline Phosphatase: 57 U/L (ref 38–126)
Anion gap: 9 (ref 5–15)
BUN: 25 mg/dL — ABNORMAL HIGH (ref 8–23)
CO2: 27 mmol/L (ref 22–32)
Calcium: 9.3 mg/dL (ref 8.9–10.3)
Chloride: 99 mmol/L (ref 98–111)
Creatinine, Ser: 0.95 mg/dL (ref 0.61–1.24)
GFR calc Af Amer: >60 mL/min (ref >60)
GFR calc non Af Amer: >60 mL/min (ref >60)
Glucose, Bld: 133 mg/dL — ABNORMAL HIGH (ref 70–99)
Potassium: 4.3 mmol/L (ref 3.5–5.1)
Sodium: 135 mmol/L (ref 135–145)
Total Bilirubin: 0.3 mg/dL (ref 0.3–1.2)
Total Protein: 7.4 g/dL (ref 6.5–8.1)

## 2018-09-20 LAB — CBC
HCT: 30.5 % — ABNORMAL LOW (ref 39.0–52.0)
Hemoglobin: 9.4 g/dL — ABNORMAL LOW (ref 13.0–17.0)
MCH: 28.1 pg (ref 26.0–34.0)
MCHC: 30.8 g/dL (ref 30.0–36.0)
MCV: 91.3 fL (ref 80.0–100.0)
Platelets: 266 10*3/uL (ref 150–400)
RBC: 3.34 MIL/uL — ABNORMAL LOW (ref 4.22–5.81)
RDW: 13.5 % (ref 11.5–15.5)
WBC: 4.8 10*3/uL (ref 4.0–10.5)
nRBC: 0 % (ref 0.0–0.2)

## 2018-09-20 MED ORDER — METRONIDAZOLE 250 MG PO TABS: 250.0000 mg | ORAL_TABLET | Freq: Three times a day (TID) | ORAL | 0 refills | Status: DC

## 2018-09-20 MED ORDER — CEFDINIR 300 MG PO CAPS: 300.0000 mg | ORAL_CAPSULE | Freq: Two times a day (BID) | ORAL | 0 refills | Status: DC

## 2018-09-20 MED ORDER — PRUCALOPRIDE SUCCINATE 2 MG PO TABS: 2.0000 mg | ORAL_TABLET | Freq: Every day | ORAL | 3 refills | Status: DC

## 2018-09-20 NOTE — TOC Transition Note (Signed)
Transition of Care [facility]) - CM/SW Discharge Note   Patient Details  Name: [name] MRN: [id number] Date of Birth: 10-11-1950  Transition of [facility]) CM/SW Contact:  Zettie Pho, LCSW Phone Number: 09/20/2018, 12:54 PM   Clinical Narrative:    The social worker has sent all discharge information to the facility. The patient's attending RN has contacted the patient's family. The TOC is signing off. Please consult should needs arise.   Final next level of care: Skilled Nursing Facility Barriers to Discharge: No Barriers Identified   Patient Goals and CMS Choice Patient states their goals for this hospitalization and ongoing recovery are:: rehab  CMS Medicare.gov Compare Post Acute Care list provided to:: Patient Represenative (must comment) Choice offered to / list presented to : [place] / Guardian  Discharge Placement   Existing PASRR number confirmed : 09/20/18          Patient chooses bed at: [facility] Patient to be transferred to facility by: Wheelchair  Name of family member notified: Lanney Gins (aunt) Patient and family notified of of transfer: 09/20/18  Discharge Plan and Services                DME Arranged: N/A DME Agency: NA       HH Arranged: NA HH Agency: NA        Social Determinants of Health ([place]) Interventions     Readmission Risk Interventions Readmission Risk Prevention Plan 09/20/2018 09/19/2018 09/19/2018  Transportation Screening Complete Complete -  Medication Review Press photographer) Complete Complete Complete  PCP or Specialist appointment within 3-5 days of discharge Complete Complete Not Complete  PCP/Specialist Appt Not Complete comments - SNF resident. Sees facility MD SNF resident. Sees facility MD.  [facility] or Home Care Consult Not Complete - Not Complete  HRI or Home Care Consult Pt Refusal Comments Patient is discharging to SNF for rehab - Patient is in SNF but was active with [facility] prior to SNF.  SW Recovery  Care/Counseling Consult Complete - Complete  Palliative Care Screening Not Applicable - Not Applicable  Skilled Nursing Facility Complete - Complete  Some recent data might be hidden

## 2018-09-20 NOTE — [place] FL2 (Signed)
[place] MEDICAID FL2 LEVEL OF CARE SCREENING TOOL     IDENTIFICATION  Patient Name: [name] Birthdate: Sep 11, 1950 Sex: male Admission Date (Current Location): 09/18/2018  [place]   Facility and Address:  [facility], [address], [place], [place] [address]      Provider Number: [id number]  Attending Physician Name and Address:  Erick BlinksShah, Pratik D, DO  Relative Name and Phone Number:  Otelia SergeantGeneva Long (Aunt) [phone]    Current Level of Care: Hospital Recommended Level of Care: Skilled Nursing Facility Prior Approval Number:    Date Approved/Denied:   PASRR Number: [id number]   Discharge Plan: SNF    Current Diagnoses: Patient Active Problem List   Diagnosis Date Noted  . Acute lower UTI 09/18/2018  . Abdominal distention 09/18/2018  . Hypomagnesemia 09/09/2018  . Protein-calorie malnutrition, severe (HCC) 09/09/2018  . Acute blood loss anemia 09/09/2018  . Gangrenous cholecystitis 09/05/2018  . Volvulus (HCC) 09/03/2018  . Ileus (HCC)   . Pneumatosis of intestines   . Gall bladder disease 08/25/2018  . Small bowel ischemia (HCC)   . Acute gangrenous cholecystitis 05/24/2018  . SBO (small bowel obstruction) (HCC) 05/24/2018  . Abdominal distension   . Pressure injury of skin 04/09/2018  . C. difficile diarrhea 04/08/2018  . Bilateral hydronephrosis 04/08/2018  . Pneumoperitoneum of unknown etiology   . Diarrhea 04/07/2018  . Nonverbal 04/07/2018  . Hydronephrosis with urinary obstruction due to ureteral calculus 06/20/2016  . Sepsis secondary to UTI (HCC) 06/20/2016  . Fever 06/19/2016  . Generalized weakness 06/19/2016  . Diabetes mellitus type 2, noninsulin dependent (HCC) 06/19/2016  . Severe sepsis with septic shock (HCC) 06/19/2016  . UTI (urinary tract infection) 06/19/2016  . Hematuria 06/19/2016  . AKI (acute kidney injury) (HCC) 06/19/2016  . Hyponatremia 06/19/2016  . Hypokalemia  06/19/2016  . BPH (benign prostatic hyperplasia) 06/19/2016  . 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 Urinary retention 05/07/2014  . Intellectual disability 05/07/2014  . Hyperlipidemia 05/07/2014  . HTN (hypertension) 05/07/2014    Orientation RESPIRATION BLADDER Height & Weight     Self  Normal Incontinent Weight: 144 lb 13.5 oz (65.7 kg) Height:  5\' 7"  (170.2 cm)  BEHAVIORAL SYMPTOMS/MOOD NEUROLOGICAL BOWEL NUTRITION STATUS      Continent Diet(Heart healthy)  AMBULATORY STATUS COMMUNICATION OF NEEDS Skin   Extensive Assist Non-Verbally Other (Comment)( (biliary drain to RUQ))                       Personal Care Assistance Level of Assistance    Bathing Assistance: Limited assistance Feeding assistance: Independent Dressing Assistance: Limited assistance     Functional Limitations Info    Sight Info: Adequate Hearing Info: Adequate Speech Info: Impaired(Non-verbal)    SPECIAL CARE FACTORS FREQUENCY        PT Frequency: 5X per week       Speech Therapy Frequency: 5X per week      Contractures Contractures Info: Present    Additional Factors Info  Code Status, Allergies Code Status Info: Full Allergies Info: No Known Allergies   Insulin Sliding Scale Info: See DC Summary       Current Medications (09/20/2018):  This is the current hospital active medication list Current Facility-Administered Medications  Medication Dose Route Frequency Provider Last Rate Last Dose  . 0.9 %  sodium chloride infusion   Intravenous PRN Sherryll BurgerShah, Pratik D, DO   Stopped at 09/19/18 1547  . acetaminophen (  TYLENOL) tablet 650 mg  650 mg Oral Q6H PRN Jani Gravel, MD       Or  . acetaminophen (TYLENOL) suppository 650 mg  650 mg Rectal Q6H PRN Jani Gravel, MD      . aspirin EC tablet 81 mg  81 mg Oral q morning - 10a Jani Gravel, MD   81 mg at 09/20/18 0929  . bisacodyl (DULCOLAX) suppository 10 mg  10 mg Rectal Once Rehman, Najeeb U, MD      . cefTRIAXone (ROCEPHIN) 1 g in  sodium chloride 0.9 % 100 mL IVPB  1 g Intravenous Q24H Shah, Pratik D, DO 200 mL/hr at 09/20/18 1031 1 g at 09/20/18 1031  . enoxaparin (LOVENOX) injection 40 mg  40 mg Subcutaneous Q24H Jani Gravel, MD   40 mg at 09/20/18 5400  . feeding supplement (GLUCERNA SHAKE) (GLUCERNA SHAKE) liquid 237 mL  237 mL Oral TID BM Shah, Pratik D, DO   237 mL at 09/20/18 0929  . feeding supplement (PRO-STAT SUGAR FREE 64) liquid 30 mL  30 mL Oral BID Manuella Ghazi, Pratik D, DO   30 mL at 09/20/18 0929  . finasteride (PROSCAR) tablet 5 mg  5 mg Oral Daily Jani Gravel, MD   5 mg at 09/20/18 0929  . magnesium oxide (MAG-OX) tablet 400 mg  400 mg Oral Daily Jani Gravel, MD   400 mg at 09/20/18 0930  . metroNIDAZOLE (FLAGYL) tablet 250 mg  250 mg Oral TID Rogene Houston, MD   250 mg at 09/20/18 0930  . nutrition supplement (JUVEN) (JUVEN) powder packet 1 packet  1 packet Oral BID BM Manuella Ghazi, Pratik D, DO   1 packet at 09/20/18 0930  . phosphorus (K PHOS NEUTRAL) tablet 250 mg  250 mg Oral Daily Jani Gravel, MD   250 mg at 09/20/18 0931  . Prucalopride Succinate TABS 2 mg  2 mg Oral Daily Rehman, Najeeb U, MD   2 mg at 09/20/18 0930  . simethicone (MYLICON) 40 QQ/7.6PP suspension 40 mg  40 mg Oral QID Jani Gravel, MD   40 mg at 09/20/18 0931  . tamsulosin (FLOMAX) capsule 0.4 mg  0.4 mg Oral QPM Jani Gravel, MD   0.4 mg at 09/19/18 1818     Discharge Medications: Please see discharge summary for a list of discharge medications.  Relevant Imaging Results:  Relevant Lab Results:   Additional Information SS# [place], [place]

## 2018-09-20 NOTE — Progress Notes (Signed)
[facility] Progress Note     Subjective: Looking better. Had BMs per the aunt. No complaints. Ate breakfast. Less distended.   Objective: Vital signs in last 24 hours: Temp:  [97.6 F (36.4 C)-98.5 F (36.9 C)] 98.1 F (36.7 C) (06/06 0520) Pulse Rate:  [59-93] 80 (06/06 0520) Resp:  [16-24] 16 (06/06 0520) BP: (102-133)/(63-77) 102/63 (06/06 0520) SpO2:  [89 %-100 %] 97 % (06/06 0757) Weight:  [65.7 kg] 65.7 kg (06/06 0500) Last BM Date: 09/19/18  Intake/Output from previous day: 06/05 0701 - 06/06 0700 In: 2149.8 [P.O.:960; I.V.:19.8; IV Piggyback:100] Out: 1800 [Urine:1800] Intake/Output this shift: Total I/O In: -  Out: 550 [Urine:550]  General appearance: alert, cooperative and no distress Resp: normal work of breathing GI: soft, mildly distended, incision c/d/i,steri removed, washed incision, some minor scabbing, minimal residual redness superiorly, overall much improved  Lab Results:  Recent Labs    09/19/18 0634 09/20/18 0633  WBC 6.2 4.8  HGB 10.2* 9.4*  HCT 32.3* 30.5*  PLT 276 266   BMET Recent Labs    09/19/18 0634 09/20/18 0633  NA 135 135  K 5.1 4.3  CL 103 99  CO2 26 27  GLUCOSE 152* 133*  BUN 25* 25*  CREATININE 1.08 0.95  CALCIUM 9.4 9.3   PT/INR No results for input(s): LABPROT, INR in the last 72 hours.  Studies/Results: Dg Small Bowel W Single Cm (sol Or Thin Ba)  Result Date: 09/19/2018 CLINICAL DATA:  Abdominal distension, chronic bowel dilatation, question obstruction EXAM: SMALL BOWEL SERIES COMPARISON:  CT abdomen and pelvis 09/18/2018 TECHNIQUE: Following ingestion of thin barium, serial small bowel images were obtained including spot views of the terminal ileum. FLUOROSCOPY TIME:  Fluoroscopy Time:  None Radiation Exposure Index (if provided by the fluoroscopic device): 8.9 mGy Number of Acquired Spot Images: 8 FINDINGS: Cholecystostomy tube RIGHT upper quadrant. Extensive gaseous distention of large and small  bowel loops throughout abdomen. Gaseous distention of stomach. Stool in LEFT colon. Multiple large LEFT renal calculi. Bones demineralized. Poor emptying of contrast from the stomach initially likely due to supine positioning. Images were obtained for nearly 23 hours. Images demonstrate gradual emptying of contrast from the stomach into small bowel loops, which appears significantly dilated. Proximal small bowel loops are located in the RIGHT upper quadrant suggesting at least a partial small bowel mal rotation. At 7 hours contrast has entered the RIGHT colon. Delayed image at 22.75 hours shows significant contrast throughout the colon with residual contrast identified within both dilated and nondilated small bowel loops in the mid abdomen. No definite bowel wall thickening or evidence of obstruction. Decreased gaseous distention of the colon at 22.75 hours. IMPRESSION: Significantly dilated small bowel loops though no evidence of bowel obstruction is identified. Cholelithiasis. Electronically Signed   By: Ulyses SouthwardMark  Boles M.D.   On: 09/19/2018 08:48    Anti-infectives: Anti-infectives (From admission, onward)   Start     Dose/Rate Route Frequency Ordered Stop   09/19/18 1130  cefTRIAXone (ROCEPHIN) 1 g in sodium chloride 0.9 % 100 mL IVPB     1 g 200 mL/hr over 30 Minutes Intravenous Every 24 hours 09/19/18 1117     09/18/18 2200  metroNIDAZOLE (FLAGYL) tablet 250 mg     250 mg Oral 3 times daily 09/18/18 1951        Assessment/Plan: [name] is a 68 yo s/p Ex lap and reduction of volvulus on 5/12. Returned with ileus and UTI a few weeks ago. No just  with slow motility and Dr. 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  -Continue Motegrity per GI -Continue to get patient out of bed twice daily to toilet to evacuate stool/ gas   Future Appointments  Date Time Provider [place]  09/24/2018 11:30 AM Vaughan Basta, Rona Ravens, NP NRE-NRE None  11/04/2018 10:00 AM Virl Cagey, MD RS-RS None     LOS: 2 days    Virl Cagey 09/20/2018

## 2018-09-20 NOTE — Discharge Summary (Signed)
Physician Discharge Summary  [name] ZOX:[id number] DOB: Jul 23, 1950 DOA: 09/18/2018  PCP: Pearson Grippe, MD  Admit date: 09/18/2018  Discharge date: 09/20/2018  Admitted From:SNF  Disposition:  SNF  Recommendations for Outpatient Follow-up:  1. Follow up with PCP in 1-2 weeks 2. Follow-up with Dr. Karilyn Cota in 2 weeks 3. Follow-up with general surgery outpatient Dr. Henreitta Leber as scheduled for reevaluation of cholecystostomy drain 4. Remain on Motegrity 2 mg daily as prescribed 5. Finish course of Flagyl for SIBO along with Omnicef for UTI as prescribed 6. Remain on Mylicon as previously prescribed 7. Ambulate at least twice a day  Home Health:None  Equipment/Devices:None  Discharge Condition:Stable  CODE STATUS: Full  Diet recommendation: Heart Healthy  Brief/Interim Summary: Per HPI: [name] a68 y.o.male,w intellectual disability, dm2, Bph, h/o acute gangrenous cholecystitis s/p percutaneous cholecystostomy 05/25/2018, w admission for SBO s/p exp lap w reduction of volvulus 08/26/2018, w admission for Ileus 09/05/2018 presents from SNF for evaluation of ? SBO. Pt is a poor historian and unable to give any meaningful history.   Review of records shows that pt was seen by surgery on 09/16/2018 => slight erythema around staple line and started on Bactrim. Cont w cholecystostomy tube  Patient has CT of the abdomen and pelvis in the ED with noted diffusely dilated and air-filled loops of small bowel similar to prior CT which was thought to represent ileus.  Small bowel follow-through was performed on 6/4 with some dilated loops of bowel noted.  He has been seen by general surgery as well as GI and there are thoughts that patient has acute on chronic pseudo-GI obstruction.  His abdominal distention has decreased and his bowels are now beginning to move on 6/5, and therefore diet was advanced to heart healthy.  He continues to tolerate his diet without any symptomatic issues on day  of discharge.  He is overall doing well.  Urine analysis did demonstrate some signs of UTI initially for which she was started on Rocephin and has had no further symptomatology.  He will complete 7-day course now with Omnicef.  Remain on Flagyl as prescribed for small intestinal bacterial overgrowth by GI as well as Motegrity and Mylicon.  He will follow-up with GI as well as general surgery in the outpatient setting.  He is otherwise stable for discharge back to SNF today.  Discharge Diagnoses:  Principal Problem:   Abdominal distension Active Problems:   HTN (hypertension)   Diabetes mellitus type 2, noninsulin dependent (HCC)   Hyponatremia   Ileus (HCC)   Acute lower UTI   Abdominal distention  Principal discharge diagnosis: Diffuse GI motility disorder with acute on chronic pseudoobstruction exacerbated by chronic immobilization.  Discharge Instructions  Discharge Instructions    Diet - low sodium heart healthy   Complete by:  As directed    Increase activity slowly   Complete by:  As directed      Allergies as of 09/20/2018   No Known Allergies     Medication List    STOP taking these medications   Potassium Chloride ER 20 MEQ Tbcr   sulfamethoxazole-trimethoprim 800-160 MG tablet Commonly known as:  BACTRIM DS     TAKE these medications   aspirin EC 81 MG tablet Take 81 mg by mouth every morning.   cefdinir 300 MG capsule Commonly known as:  OMNICEF Take 1 capsule (300 mg total) by mouth 2 (two) times daily for 5 days.   finasteride 5 MG tablet Commonly known as:  PROSCAR  Take 1 tablet (5 mg total) by mouth daily for 30 days.   magnesium oxide 400 MG tablet Commonly known as:  MAG-OX Take 1 tablet (400 mg total) by mouth daily for 30 days.   metFORMIN 500 MG tablet Commonly known as:  GLUCOPHAGE Take 1,000 mg by mouth every morning.   metroNIDAZOLE 250 MG tablet Commonly known as:  FLAGYL Take 1 tablet (250 mg total) by mouth 3 (three) times daily for 5  days.   phosphorus 155-852-130 MG tablet Commonly known as:  K PHOS NEUTRAL Take 1 tablet (250 mg total) by mouth daily.   Prucalopride Succinate 2 MG Tabs Take 2 mg by mouth daily for 30 days. Start taking on:  September 21, 2018   RISA-BID PROBIOTIC PO Take 1 capsule by mouth 2 (two) times a day.   simethicone 40 MG/0.6ML drops Commonly known as:  MYLICON Take 0.6 mLs (40 mg total) by mouth 4 (four) times daily.   tamsulosin 0.4 MG Caps capsule Commonly known as:  FLOMAX Take 1 capsule (0.4 mg total) by mouth every evening.   TYLENOL ARTHRITIS PAIN PO Take 1 tablet by mouth every 6 (six) hours as needed (pain).   Venelex Oint Apply topically to sacrum and bilateral buttocks every shift and as needed for blanchable erythema and prevention      Follow-up Information    Pearson Grippe, MD Follow up in 1 week(s).   Specialty:  Internal Medicine Contact information: [address] [place] [address] [phone]        Malissa Hippo, MD Follow up in 2 week(s).   Specialty:  Gastroenterology Contact information: [address], [address] [place] [address] [phone]          No Known Allergies  Consultations:  General surgery  GI   Procedures/Studies: Dg Chest 1 View  Result Date: 09/06/2018 CLINICAL DATA:  68 y/o  M; NG tube placement. EXAM: CHEST  1 VIEW COMPARISON:  08/16/2018 chest radiograph FINDINGS: NG tube is coiling in the gastric body with tip projecting over proximal stomach unchanged in position. Stable cardiac silhouette given projection and technique. Low lung volumes and increased pulmonary markings. Hazy opacification of the right lung may represent a layering effusion. Cholecystostomy tube projects over right upper quadrant. Bones are unremarkable. IMPRESSION: NG tube is coiling in the gastric body with tip projecting over proximal stomach unchanged in position. Stable low lung volumes and increased pulmonary markings. Hazy  opacification of the right lung may represent a layering effusion. Electronically Signed   By: Mitzi Hansen M.D.   On: 09/06/2018 02:51   Dg Abd 1 View  Result Date: 09/06/2018 CLINICAL DATA:  Abdominal distension EXAM: ABDOMEN - 1 VIEW COMPARISON:  Sep 05, 2018 FINDINGS: Nasogastric tube tip and side port in stomach. There is a presumed Frost amic catheter on the right. There is moderate generalized bowel dilatation, less pronounced than on 1 day prior. No air-fluid levels or free air evident. Urinary bladder appears distended with contrast. There are skin staples overlying the lower abdomen and pelvis just to the right of midline. IMPRESSION: Suspect a degree of bowel ileus. There is less bowel dilatation compared to 1 day prior. Nasogastric tube tip and side port in stomach. Presumed nephrostomy catheter on the right. Urinary bladder distended. Electronically Signed   By: Bretta Bang III M.D.   On: 09/06/2018 09:53   Ct Abdomen Pelvis W Contrast  Result Date: 09/18/2018 CLINICAL DATA:  68 year old male  with abdominal pain and distention. Rule out bowel obstruction. EXAM: CT ABDOMEN AND PELVIS WITH CONTRAST TECHNIQUE: Multidetector CT imaging of the abdomen and pelvis was performed using the standard protocol following bolus administration of intravenous contrast. CONTRAST:  OMNIPAQUE IOHEXOL 300 MG/ML  SOLN COMPARISON:  Abdominal radiograph dated 09/06/2018 and CT dated 09/05/2018 FINDINGS: Lower chest: The visualized lung bases are clear. No intra-abdominal free air or free fluid. Hepatobiliary: The liver is unremarkable. No intrahepatic biliary ductal dilatation. The gallbladder is contracted around a percutaneous cholecystostomy. Pancreas: Unremarkable. No pancreatic ductal dilatation or surrounding inflammatory changes. Spleen: Normal in size without focal abnormality. Adrenals/Urinary Tract: The adrenal glands are unremarkable. Several nonobstructing left renal calculi measure  up to 15 mm in the interpolar left kidney. There is no hydronephrosis on either side. There is symmetric enhancement and excretion of contrast by both kidneys. The visualized ureters appear unremarkable. The urinary bladder is decompressed around a Foley catheter. Stomach/Bowel: There is diffuse circumferential thickening of the visualized distal esophagus which may represent esophagitis secondary to recurrent reflux. An infiltrative process is not entirely excluded. There is moderate to severe distention of the stomach with air and content. There is diffuse dilatation of air-filled loops of small bowel measuring up to 7.5 cm in diameter in the anterior abdomen. Findings are somewhat similar to prior CT and may represent an ileus. There are however multiple normal caliber loops of small bowel in the left lower abdomen and therefore a degree of obstruction is not entirely excluded. Small-bowel series may provide better evaluation to exclude a mechanical obstruction. There is also diffuse air distention of the colon. There is no pneumatosis. The appendix is not visualized with certainty. Vascular/Lymphatic: The abdominal aorta and IVC appear unremarkable. No portal venous gas. There is no adenopathy. Reproductive: Multiple surgical clips in the region of the prostate gland. Other: None Musculoskeletal: Degenerative changes of the spine. No acute osseous pathology. IMPRESSION: 1. Diffusely dilated and air-filled loops of small bowel similar to prior CT and may represent an ileus. A degree of small-bowel obstruction is not entirely excluded. Small-bowel series may provide better evaluation. Clinical correlation and follow-up recommended. 2. No free air or pneumatosis.  No portal venous gas. 3. Second frontal thickening of the distal esophagus may represent esophagitis although infiltrative mass is not excluded. Clinical correlation is recommended. 4. Percutaneous cholecystostomy with contracted gallbladder. 5.  Nonobstructing left renal calculi. No hydronephrosis. Electronically Signed   By: Elgie Collard M.D.   On: 09/18/2018 04:08   Ct Abdomen Pelvis W Contrast  Result Date: 09/05/2018 CLINICAL DATA:  68 year old male with history of biliary drain with extensive swelling and bruising in the abdomen. EXAM: CT ABDOMEN AND PELVIS WITH CONTRAST TECHNIQUE: Multidetector CT imaging of the abdomen and pelvis was performed using the standard protocol following bolus administration of intravenous contrast. CONTRAST:  OMNIPAQUE IOHEXOL 300 MG/ML  SOLN COMPARISON:  CT the abdomen and pelvis 08/25/2018. FINDINGS: Lower chest: Moderate right and small left pleural effusions lying dependently with some associated passive subsegmental atelectasis in the lower lobes of the lungs bilaterally. Distal esophagus is patulous with mural thickening and mucosal hyperenhancement. Hepatobiliary: Status post left hemihepatectomy. Percutaneous cholecystostomy tube in position within a completely decompressed gallbladder. No suspicious hepatic lesions. No intra or extrahepatic biliary ductal dilatation. Pancreas: No pancreatic mass. No pancreatic ductal dilatation. No pancreatic or peripancreatic fluid or inflammatory changes. Spleen: Unremarkable. Adrenals/Urinary Tract: There are multiple nonobstructive calculi within the left renal collecting system and renal pelvis measuring up  to 12 mm in the interpolar region. No suspicious renal lesions. Moderate right and severe left hydroureteronephrosis. No definite ureteral calculi. Small nonobstructive calculi are also noted lying dependently in the urinary bladder measuring up to 5 mm in diameter. Urinary bladder is moderately distended, thick-walled and highly trabeculated. Several small bladder diverticulae are noted, largest of which is in the superior aspect of the urinary bladder measuring up to 4.8 cm in diameter. Small amount of gas within the urinary bladder non dependently.  Stomach/Bowel: Stomach is moderately distended with a large air-fluid level. No nasogastric tube is noted. Duodenum does not appear distended. Massive dilatation of multiple loops of small bowel measuring up to 8.0 cm in diameter with multiple air-fluid levels. No discrete transition point is confidently identified in the small bowel on today's examination. Colon is relatively decompressed, although there is gas and stool scattered throughout the colon and rectum. Normal appendix. Vascular/Lymphatic: Atherosclerotic calcifications in the pelvic vasculature. No aneurysm or dissection noted in the abdomen or pelvis. No lymphadenopathy noted in the abdomen or pelvis. Reproductive: Postoperative changes in the prostate gland suggesting prior TURP. Seminal vesicles are unremarkable in appearance. Other: Trace volume of ascites. No pneumoperitoneum. Musculoskeletal: Midline skin staples in the mid to lower abdomen. There are no aggressive appearing lytic or blastic lesions noted in the visualized portions of the skeleton. IMPRESSION: 1. Massive dilatation of the small bowel, without definite transition point. Small volume of gas and stool in a relatively decompressed colon. Findings are favored to reflect small bowel ileus, which has been evident on numerous prior abdominal studies. The possibility of partial or early small-bowel obstruction is not entirely excluded, but not strongly favored at this time. 2. Small amount of gas non dependently in the urinary bladder, favored to be iatrogenic. In the absence of recent catheterization, correlation with urinalysis would be strongly recommended to exclude the possibility of urinary tract infection with gas-forming organisms. 3. Multiple nonobstructive calculi within the left renal collecting system and lying dependently in the lumen of the urinary bladder. No ureteral stones are noted at this time. Despite this, there is moderate right and severe left hydroureteronephrosis,  which could be indicative of distal ureteral strictures, and is presumably exacerbated in the setting of a markedly distended urinary bladder. Urologic consultation is recommended. 4. Moderate right and small left pleural effusions lying dependently with some associated passive subsegmental atelectasis throughout the lower lobes of the lungs bilaterally. 5. Patulous fluid-filled esophagus which demonstrates mural thickening and hyper enhancement, which may simply reflect esophagitis, however, there is any clinical concern for Barrett's metaplasia or esophageal neoplasia, further evaluation with endoscopy could be considered. 6. Additional incidental findings, as above. Electronically Signed   By: Trudie Reed M.D.   On: 09/05/2018 12:20   Ct Abdomen Pelvis W Contrast  Result Date: 08/25/2018 CLINICAL DATA:  68 year old male with abdominal distension and pain and nausea. EXAM: CT ABDOMEN AND PELVIS WITH CONTRAST TECHNIQUE: Multidetector CT imaging of the abdomen and pelvis was performed using the standard protocol following bolus administration of intravenous contrast. CONTRAST:  OMNIPAQUE IOHEXOL 300 MG/ML  SOLN COMPARISON:  CT of the abdomen pelvis dated 05/23/2018 FINDINGS: Lower chest: Bibasilar dependent atelectatic changes noted. There is a 4 mm left lower lobe subpleural nodule. No definite intra-abdominal free air. No free fluid. Hepatobiliary: The liver is unremarkable. The gallbladder is decompressed around a percutaneous cholecystostomy tube. Pancreas: Unremarkable. No pancreatic ductal dilatation or surrounding inflammatory changes. Spleen: Normal in size without focal abnormality. Adrenals/Urinary Tract:  The adrenal glands are unremarkable. Multiple large and staghorn left renal calculi noted measuring up to 17 mm in the interpolar aspect of the kidney. There is a 16 mm stone or cluster of stones in the left renal pelvis with partial obstruction of the UPJ and resulting mild left  hydronephrosis. There is no hydronephrosis on the right. The visualized ureters appear unremarkable. There are several stones within the urinary bladder. There is diffuse trabeculation of the bladder wall likely related to chronic bladder outlet obstruction or chronic inflammation. Stomach/Bowel: The stomach is distended with air and ingested content. There is retained ingested material within the visualized esophagus which may represent reflux or delayed esophageal clearance and dysmotility. There is mild thickening of the gastroesophageal junction which may be related to chronic and recurrent reflux. There are multiple dilated loops of small bowel with air-fluid levels measuring up to 7.5 cm in diameter. There is a probable transition in the mid abdomen (series 2, image 60 and coronal series 5, image 51). There is large amount of stool throughout the colon. Vascular/Lymphatic: There is gas within mesenteric vessels (series 2, image 49 and 47 and 50) concerning for bowel ischemia. Clinical correlation and surgical consult is advised. No gas is noted within the portal veins. The abdominal aorta is unremarkable. The origins of the celiac axis, SMA, IMA are patent. The SMV, splenic vein, and main portal vein are patent. There is no adenopathy. Reproductive: Multiple fiducial markers noted within the prostate gland. Other: None Musculoskeletal: Degenerative changes of the spine. No acute osseous pathology. IMPRESSION: 1. High-grade small bowel obstruction with pneumatosis of the mesenteric vessels concerning for bowel ischemia. Clinical correlation and surgical consult is advised. 2. Distended stomach with ingested content in the distal esophagus. 3. Multiple nonobstructing left renal calculi. A partially obstructing stone or string of adjacent stones in the left UPJ with associated mild left hydronephrosis. Multiple bladder calculi. 4. Percutaneous cholecystostomy tube with decompression of the gallbladder. These  results were called by telephone at the time of interpretation on 08/25/2018 at 9:58 pm to Dr. Raeford RazorSTEPHEN KOHUT , who verbally acknowledged these results. Electronically Signed   By: Elgie CollardArash  Radparvar M.D.   On: 08/25/2018 22:00   Dg Small Bowel W Single Cm (sol Or Thin Ba)  Result Date: 09/19/2018 CLINICAL DATA:  Abdominal distension, chronic bowel dilatation, question obstruction EXAM: SMALL BOWEL SERIES COMPARISON:  CT abdomen and pelvis 09/18/2018 TECHNIQUE: Following ingestion of thin barium, serial small bowel images were obtained including spot views of the terminal ileum. FLUOROSCOPY TIME:  Fluoroscopy Time:  None Radiation Exposure Index (if provided by the fluoroscopic device): 8.9 mGy Number of Acquired Spot Images: 8 FINDINGS: Cholecystostomy tube RIGHT upper quadrant. Extensive gaseous distention of large and small bowel loops throughout abdomen. Gaseous distention of stomach. Stool in LEFT colon. Multiple large LEFT renal calculi. Bones demineralized. Poor emptying of contrast from the stomach initially likely due to supine positioning. Images were obtained for nearly 23 hours. Images demonstrate gradual emptying of contrast from the stomach into small bowel loops, which appears significantly dilated. Proximal small bowel loops are located in the RIGHT upper quadrant suggesting at least a partial small bowel mal rotation. At 7 hours contrast has entered the RIGHT colon. Delayed image at 22.75 hours shows significant contrast throughout the colon with residual contrast identified within both dilated and nondilated small bowel loops in the mid abdomen. No definite bowel wall thickening or evidence of obstruction. Decreased gaseous distention of the colon at 22.75 hours. IMPRESSION:  Significantly dilated small bowel loops though no evidence of bowel obstruction is identified. Cholelithiasis. Electronically Signed   By: Ulyses SouthwardMark  Boles M.D.   On: 09/19/2018 08:48   Dg Small Bowel W Single Cm (sol Or Thin  Ba)  Result Date: 09/02/2018 CLINICAL DATA:  Small bowel obstruction. EXAM: SMALL BOWEL SERIES COMPARISON:  Radiographs of Aug 27, 2018. TECHNIQUE: Following ingestion of water-soluble contrast, serial small bowel images were obtained. No spot images were obtained. 50 mL of water-soluble contrast was mixed with 50 mL of water and administered through pre-existing nasogastric tube. Diffuse small bowel dilatation is noted concerning for ileus or distal small bowel obstruction. No significant colonic dilatation is noted. The initial images demonstrate contrast remaining within the proximal stomach. The final image was obtained 16 hours after the administration of contrast. No contrast is visualized in the small or large bowel on this study. IMPRESSION: Diffuse small bowel dilatation is noted. The contrast administered through nasogastric tube with seen to pool within the stomach and not advance on the initial images. Delayed images demonstrate no contrast present in the large or small bowel. These findings are consistent with either ileus or distal small bowel obstruction. Electronically Signed   By: Lupita RaiderJames  Green Jr M.D.   On: 09/02/2018 08:34   Dg Chest Portable 1 View  Result Date: 09/05/2018 CLINICAL DATA:  Status post nasogastric tube placement. EXAM: PORTABLE CHEST 1 VIEW COMPARISON:  Radiographs of same day. FINDINGS: Stable cardiomediastinal silhouette. Hypoinflation of the lungs is noted with mild bibasilar subsegmental atelectasis. No pneumothorax is noted. Distal tip of nasogastric tube is seen in proximal stomach. Bony thorax is unremarkable. IMPRESSION: Hypoinflation of the lungs is noted with mild bibasilar subsegmental atelectasis. Distal tip of nasogastric tube is seen in proximal stomach. Electronically Signed   By: Lupita RaiderJames  Green Jr M.D.   On: 09/05/2018 13:23   Dg Chest Port 1 View  Result Date: 08/30/2018 CLINICAL DATA:  Bedside nasogastric tube placement. EXAM: PORTABLE CHEST 1 VIEW  COMPARISON:  08/25/2018 and earlier. FINDINGS: Nasogastric tube looped in the stomach and its tip appears to be within the proximal gastric body. Small bowel distension in the visualized upper abdomen has improved since the examination 5 days ago. Cholecystostomy tube in the RIGHT UPPER QUADRANT again noted. Suboptimal inspiration with bibasilar atelectasis, unchanged. Lungs remain clear otherwise. Pulmonary vascularity normal. IMPRESSION: 1. Nasogastric tube looped in the stomach and its tip appears to be within the proximal gastric body. 2. Suboptimal inspiration with bibasilar atelectasis, unchanged. 3. No new pulmonary abnormalities. Electronically Signed   By: Hulan Saashomas  Lawrence M.D.   On: 08/30/2018 15:53   Dg Chest Port 1 View  Result Date: 08/25/2018 CLINICAL DATA:  Preoperative imaging EXAM: PORTABLE CHEST 1 VIEW COMPARISON:  None. FINDINGS: Shallow lung inflation with bibasilar atelectasis. No focal airspace consolidation. Cardiomediastinal contours are normal. IMPRESSION: No acute airspace disease. Electronically Signed   By: Deatra RobinsonKevin  Herman M.D.   On: 08/25/2018 23:09   Dg Chest Port 1v Same Day  Result Date: 09/06/2018 CLINICAL DATA:  Nasogastric tube placement. EXAM: PORTABLE CHEST 1 VIEW COMPARISON:  Sep 06, 2018 study obtained earlier in the day FINDINGS: Nasogastric tube tip and side port are in the stomach, essentially unchanged from earlier in the day. There is a layering pleural effusion on the right. There is no edema or consolidation. There is mild cardiomegaly with pulmonary venous hypertension. No adenopathy. No bone lesions. IMPRESSION: Nasogastric tube tip and side port in the stomach with the tip directed  superiorly in the proximal body of the stomach, unchanged from earlier in the day. They are in pleural effusion on the right. Pulmonary vascular congestion present without edema or consolidation. Electronically Signed   By: Bretta Bang III M.D.   On: 09/06/2018 07:58   Dg Abd  Acute 2+v W 1v Chest  Result Date: 09/05/2018 CLINICAL DATA:  Bloating EXAM: DG ABDOMEN ACUTE W/ 1V CHEST COMPARISON:  Four days ago FINDINGS: Marked generalized gaseous distension of small and large bowel. Rectal stool without over distension. Right upper quadrant drain is again noted. Multiple left renal calcifications. IMPRESSION: Continued generalized bowel distension suggesting adynamic ileus. Electronically Signed   By: Marnee Spring M.D.   On: 09/05/2018 10:05   Dg Abd Portable 2v  Result Date: 08/27/2018 CLINICAL DATA:  Ileus. Recent exploratory laparotomy with reduction of small bowel volvulus. EXAM: PORTABLE ABDOMEN - 2 VIEW COMPARISON:  CT abdomen and pelvis 08/25/2018 FINDINGS: Intraperitoneal free air is consistent with recent surgery. A percutaneous cholecystostomy tube is again noted in the right upper abdomen. There is prominent gaseous dilatation of multiple small bowel loops in the upper and mid abdomen. Stool is present in nondilated colon and rectum. There is also moderate gaseous distension of the stomach. Skin staples and prostate fiducials are noted. The visualized lung bases are grossly clear within limitations of motion artifact. IMPRESSION: Prominent small bowel dilatation which may reflect postoperative ileus or recurrent obstruction. Electronically Signed   By: Sebastian Ache M.D.   On: 08/27/2018 17:17     Discharge Exam: Vitals:   09/20/18 0520 09/20/18 0757  BP: 102/63   Pulse: 80   Resp: 16   Temp: 98.1 F (36.7 C)   SpO2: 98% 97%   Vitals:   09/20/18 0500 09/20/18 0502 09/20/18 0520 09/20/18 0757  BP:  133/77 102/63   Pulse:  (!) 59 80   Resp:  (!) 24 16   Temp:  98 F (36.7 C) 98.1 F (36.7 C)   TempSrc:  Oral    SpO2:  (!) 89% 98% 97%  Weight: 65.7 kg     Height:        General: Pt is alert, awake, not in acute distress, nonverbal Cardiovascular: RRR, S1/S2 +, no rubs, no gallops Respiratory: CTA bilaterally, no wheezing, no rhonchi Abdominal:  Soft, NT, minimally distended, bowel sounds +, chole tube draining Extremities: no edema, no cyanosis    The results of significant diagnostics from this hospitalization (including imaging, microbiology, ancillary and laboratory) are listed below for reference.     Microbiology: Recent Results (from the past 240 hour(s))  SARS Coronavirus 2 [facility] order, Performed in [facility] hospital lab)     Status: None   Collection Time: 09/16/18 10:30 AM  Result Value Ref Range Status   SARS Coronavirus 2 NEGATIVE NEGATIVE Final    Comment: (NOTE) If result is NEGATIVE SARS-CoV-2 target nucleic acids are NOT DETECTED. The SARS-CoV-2 RNA is generally detectable in upper and lower  respiratory specimens during the acute phase of infection. The lowest  concentration of SARS-CoV-2 viral copies this assay can detect is 250  copies / mL. A negative result does not preclude SARS-CoV-2 infection  and should not be used as the sole basis for treatment or other  patient management decisions.  A negative result may occur with  improper specimen collection / handling, submission of specimen other  than nasopharyngeal swab, presence of viral mutation(s) within the  areas targeted by this assay, and inadequate number of  viral copies  (<250 copies / mL). A negative result must be combined with clinical  observations, patient history, and epidemiological information. If result is POSITIVE SARS-CoV-2 target nucleic acids are DETECTED. The SARS-CoV-2 RNA is generally detectable in upper and lower  respiratory specimens dur ing the acute phase of infection.  Positive  results are indicative of active infection with SARS-CoV-2.  Clinical  correlation with patient history and other diagnostic information is  necessary to determine patient infection status.  Positive results do  not rule out bacterial infection or co-infection with other viruses. If result is PRESUMPTIVE POSTIVE SARS-CoV-2 nucleic acids  MAY BE PRESENT.   A presumptive positive result was obtained on the submitted specimen  and confirmed on repeat testing.  While 2019 novel coronavirus  (SARS-CoV-2) nucleic acids may be present in the submitted sample  additional confirmatory testing may be necessary for epidemiological  and / or clinical management purposes  to differentiate between  SARS-CoV-2 and other Sarbecovirus currently known to infect humans.  If clinically indicated additional testing with an alternate test  methodology [phone]) is advised. The SARS-CoV-2 RNA is generally  detectable in upper and lower respiratory sp ecimens during the acute  phase of infection. The expected result is Negative. Fact Sheet for Patients:  StrictlyIdeas.no Fact Sheet for Healthcare Providers: BankingDealers.co.za This test is not yet approved or cleared by the [place] FDA and has been authorized for detection and/or diagnosis of SARS-CoV-2 by FDA under an Emergency Use Authorization (EUA).  This EUA will remain in effect (meaning this test can be used) for the duration of the COVID-19 declaration under Section 564(b)(1) of the Act, 21 U.S.C. section 360bbb-3(b)(1), unless the authorization is terminated or revoked sooner. Performed at [facility], [address] [place], [place] [address]   SARS Coronavirus 2 ([place] - Performed in [facility] hospital lab), Hosp Order     Status: None   Collection Time: 09/18/18  2:02 AM  Result Value Ref Range Status   SARS Coronavirus 2 NEGATIVE NEGATIVE Final    Comment: (NOTE) If result is NEGATIVE SARS-CoV-2 target nucleic acids are NOT DETECTED. The SARS-CoV-2 RNA is generally detectable in upper and lower  respiratory specimens during the acute phase of infection. The lowest  concentration of SARS-CoV-2 viral copies this assay can detect is 250  copies / mL. A negative result does not preclude SARS-CoV-2 infection  and should  not be used as the sole basis for treatment or other  patient management decisions.  A negative result may occur with  improper specimen collection / handling, submission of specimen other  than nasopharyngeal swab, presence of viral mutation(s) within the  areas targeted by this assay, and inadequate number of viral copies  (<250 copies / mL). A negative result must be combined with clinical  observations, patient history, and epidemiological information. If result is POSITIVE SARS-CoV-2 target nucleic acids are DETECTED. The SARS-CoV-2 RNA is generally detectable in upper and lower  respiratory specimens dur ing the acute phase of infection.  Positive  results are indicative of active infection with SARS-CoV-2.  Clinical  correlation with patient history and other diagnostic information is  necessary to determine patient infection status.  Positive results do  not rule out bacterial infection or co-infection with other viruses. If result is PRESUMPTIVE POSTIVE SARS-CoV-2 nucleic acids MAY BE PRESENT.   A presumptive positive result was obtained on the submitted specimen  and confirmed on repeat testing.  While 2019 novel coronavirus  (SARS-CoV-2) nucleic  acids may be present in the submitted sample  additional confirmatory testing may be necessary for epidemiological  and / or clinical management purposes  to differentiate between  SARS-CoV-2 and other Sarbecovirus currently known to infect humans.  If clinically indicated additional testing with an alternate test  methodology [phone]) is advised. The SARS-CoV-2 RNA is generally  detectable in upper and lower respiratory sp ecimens during the acute  phase of infection. The expected result is Negative. Fact Sheet for Patients:  BoilerBrush.com.cy Fact Sheet for Healthcare Providers: https://pope.com/ This test is not yet approved or cleared by the [place] FDA and has been  authorized for detection and/or diagnosis of SARS-CoV-2 by FDA under an Emergency Use Authorization (EUA).  This EUA will remain in effect (meaning this test can be used) for the duration of the COVID-19 declaration under Section 564(b)(1) of the Act, 21 U.S.C. section 360bbb-3(b)(1), unless the authorization is terminated or revoked sooner. Performed at [facility], [address], [place], [place] [address]      Labs: BNP (last 3 results) No results for input(s): BNP in the last 8760 hours. Basic Metabolic Panel: Recent Labs  Lab 09/15/18 0700 09/18/18 0132 09/18/18 0717 09/19/18 0634 09/20/18 0633  NA 136 132*  --  135 135  K 4.5 4.9  --  5.1 4.3  CL 99 93*  --  103 99  CO2 27 26  --  26 27  GLUCOSE 148* 214*  --  152* 133*  BUN 14 21  --  25* 25*  CREATININE 1.00 1.21  --  1.08 0.95  CALCIUM 9.3 9.9  --  9.4 9.3  MG 1.9 2.0  --   --   --   PHOS  --   --  4.0  --   --    Liver Function Tests: Recent Labs  Lab 09/18/18 0132 09/19/18 0634 09/20/18 0633  AST ALT ALKPHOS 75 59 57  BILITOT 0.3 0.5 0.3  PROT 8.6* 7.6 7.4  ALBUMIN 4.0 3.5 3.5   Recent Labs  Lab 09/18/18 0132  LIPASE 35   No results for input(s): AMMONIA in the last 168 hours. CBC: Recent Labs  Lab 09/15/18 0700 09/18/18 0132 09/19/18 0634 09/20/18 0633  WBC 5.7 9.2 6.2 4.8  NEUTROABS  --  7.4  --   --   HGB 9.8* 12.0* 10.2* 9.4*  HCT 31.6* 38.2* 32.3* 30.5*  MCV 92.1 89.7 90.5 91.3  PLT 327 356 276 266   Cardiac Enzymes: No results for input(s): CKTOTAL, CKMB, CKMBINDEX, TROPONINI in the last 168 hours. BNP: Invalid input(s): POCBNP CBG: No results for input(s): GLUCAP in the last 168 hours. D-Dimer No results for input(s): DDIMER in the last 72 hours. Hgb A1c No results for input(s): HGBA1C in the last 72 hours. Lipid Profile No results for input(s): CHOL, HDL, LDLCALC, TRIG, CHOLHDL, LDLDIRECT in the last 72 hours. Thyroid function studies No results for  input(s): TSH, T4TOTAL, T3FREE, THYROIDAB in the last 72 hours.  Invalid input(s): FREET3 Anemia work up No results for input(s): VITAMINB12, FOLATE, FERRITIN, TIBC, IRON, RETICCTPCT in the last 72 hours. Urinalysis    Component Value Date/Time   COLORURINE YELLOW 09/18/2018 0100   APPEARANCEUR CLOUDY (A) 09/18/2018 0100   LABSPEC 1.016 09/18/2018 0100   PHURINE 6.0 09/18/2018 0100   GLUCOSEU NEGATIVE 09/18/2018 0100   HGBUR LARGE (A) 09/18/2018 0100   BILIRUBINUR NEGATIVE 09/18/2018 0100   KETONESUR NEGATIVE 09/18/2018 0100  PROTEINUR 100 (A) 09/18/2018 0100   UROBILINOGEN 1.0 07/20/2014 1718   NITRITE NEGATIVE 09/18/2018 0100   LEUKOCYTESUR LARGE (A) 09/18/2018 0100   Sepsis Labs Invalid input(s): PROCALCITONIN,  WBC,  LACTICIDVEN Microbiology Recent Results (from the past 240 hour(s))  SARS Coronavirus 2 [facility] order, Performed in [facility] hospital lab)     Status: None   Collection Time: 09/16/18 10:30 AM  Result Value Ref Range Status   SARS Coronavirus 2 NEGATIVE NEGATIVE Final    Comment: (NOTE) If result is NEGATIVE SARS-CoV-2 target nucleic acids are NOT DETECTED. The SARS-CoV-2 RNA is generally detectable in upper and lower  respiratory specimens during the acute phase of infection. The lowest  concentration of SARS-CoV-2 viral copies this assay can detect is 250  copies / mL. A negative result does not preclude SARS-CoV-2 infection  and should not be used as the sole basis for treatment or other  patient management decisions.  A negative result may occur with  improper specimen collection / handling, submission of specimen other  than nasopharyngeal swab, presence of viral mutation(s) within the  areas targeted by this assay, and inadequate number of viral copies  (<250 copies / mL). A negative result must be combined with clinical  observations, patient history, and epidemiological information. If result is POSITIVE SARS-CoV-2 target nucleic acids are  DETECTED. The SARS-CoV-2 RNA is generally detectable in upper and lower  respiratory specimens dur ing the acute phase of infection.  Positive  results are indicative of active infection with SARS-CoV-2.  Clinical  correlation with patient history and other diagnostic information is  necessary to determine patient infection status.  Positive results do  not rule out bacterial infection or co-infection with other viruses. If result is PRESUMPTIVE POSTIVE SARS-CoV-2 nucleic acids MAY BE PRESENT.   A presumptive positive result was obtained on the submitted specimen  and confirmed on repeat testing.  While 2019 novel coronavirus  (SARS-CoV-2) nucleic acids may be present in the submitted sample  additional confirmatory testing may be necessary for epidemiological  and / or clinical management purposes  to differentiate between  SARS-CoV-2 and other Sarbecovirus currently known to infect humans.  If clinically indicated additional testing with an alternate test  methodology [phone]) is advised. The SARS-CoV-2 RNA is generally  detectable in upper and lower respiratory sp ecimens during the acute  phase of infection. The expected result is Negative. Fact Sheet for Patients:  BoilerBrush.com.cy Fact Sheet for Healthcare Providers: https://pope.com/ This test is not yet approved or cleared by the [place] FDA and has been authorized for detection and/or diagnosis of SARS-CoV-2 by FDA under an Emergency Use Authorization (EUA).  This EUA will remain in effect (meaning this test can be used) for the duration of the COVID-19 declaration under Section 564(b)(1) of the Act, 21 U.S.C. section 360bbb-3(b)(1), unless the authorization is terminated or revoked sooner. Performed at [facility], [address], [place], [place] [address]   SARS Coronavirus 2 ([facility] - Performed in [facility] hospital lab), Hosp Order     Status: None    Collection Time: 09/18/18  2:02 AM  Result Value Ref Range Status   SARS Coronavirus 2 NEGATIVE NEGATIVE Final    Comment: (NOTE) If result is NEGATIVE SARS-CoV-2 target nucleic acids are NOT DETECTED. The SARS-CoV-2 RNA is generally detectable in upper and lower  respiratory specimens during the acute phase of infection. The lowest  concentration of SARS-CoV-2 viral copies this assay can detect is 250  copies /  mL. A negative result does not preclude SARS-CoV-2 infection  and should not be used as the sole basis for treatment or other  patient management decisions.  A negative result may occur with  improper specimen collection / handling, submission of specimen other  than nasopharyngeal swab, presence of viral mutation(s) within the  areas targeted by this assay, and inadequate number of viral copies  (<250 copies / mL). A negative result must be combined with clinical  observations, patient history, and epidemiological information. If result is POSITIVE SARS-CoV-2 target nucleic acids are DETECTED. The SARS-CoV-2 RNA is generally detectable in upper and lower  respiratory specimens dur ing the acute phase of infection.  Positive  results are indicative of active infection with SARS-CoV-2.  Clinical  correlation with patient history and other diagnostic information is  necessary to determine patient infection status.  Positive results do  not rule out bacterial infection or co-infection with other viruses. If result is PRESUMPTIVE POSTIVE SARS-CoV-2 nucleic acids MAY BE PRESENT.   A presumptive positive result was obtained on the submitted specimen  and confirmed on repeat testing.  While 2019 novel coronavirus  (SARS-CoV-2) nucleic acids may be present in the submitted sample  additional confirmatory testing may be necessary for epidemiological  and / or clinical management purposes  to differentiate between  SARS-CoV-2 and other Sarbecovirus currently known to infect humans.   If clinically indicated additional testing with an alternate test  methodology [phone]) is advised. The SARS-CoV-2 RNA is generally  detectable in upper and lower respiratory sp ecimens during the acute  phase of infection. The expected result is Negative. Fact Sheet for Patients:  BoilerBrush.com.cyhttps://www.fda.gov/media/136312/download Fact Sheet for Healthcare Providers: https://pope.com/https://www.fda.gov/media/136313/download This test is not yet approved or cleared by the [place] FDA and has been authorized for detection and/or diagnosis of SARS-CoV-2 by FDA under an Emergency Use Authorization (EUA).  This EUA will remain in effect (meaning this test can be used) for the duration of the COVID-19 declaration under Section 564(b)(1) of the Act, 21 U.S.C. section 360bbb-3(b)(1), unless the authorization is terminated or revoked sooner. Performed at [facility], [address] [place], [place] [id number]      Time coordinating discharge: 35 minutes  SIGNED:   Erick BlinksPratik D Airis Barbee, DO [facility] 09/20/2018, 12:26 PM  If 7PM-7AM, please contact night-coverage [email or web address] Password [email or web address]

## 2018-09-20 NOTE — Progress Notes (Signed)
Patient in room with Aunt  May. Discharge instructions given to Exeter Temvo RN at the [facility] center. IV removed. Last BM noted now @ 1330.

## 2018-09-20 NOTE — Progress Notes (Signed)
Patient alert pleasant.  No verbalization.  Nursing staff report good BM this morning.  No report of abdominal distention, nausea or vomiting.  Eating well.  70 cc out of his drain over the past 24 hours  Vital signs in last 24 hours: Temp:  [97.6 F (36.4 C)-98.5 F (36.9 C)] 98.1 F (36.7 C) (06/06 0520) Pulse Rate:  [59-93] 80 (06/06 0520) Resp:  [16-24] 16 (06/06 0520) BP: (102-133)/(63-77) 102/63 (06/06 0520) SpO2:  [89 %-100 %] 97 % (06/06 0757) Weight:  [65.7 kg] 65.7 kg (06/06 0500) Last BM Date: 09/19/18 General:   Alert,  cooperative in NAD Abdomen: Full.  Surgical site looks good.  Drain in place.  Abdomen is soft without apparent tenderness or mass.  Positive bowel sounds.     Intake/Output from previous day: 06/05 0701 - 06/06 0700 In: 2149.8 [P.O.:960; I.V.:19.8; IV Piggyback:100] Out: 1800 [Urine:1800] Intake/Output this shift: No intake/output data recorded.  Lab Results: Recent Labs    09/18/18 0132 09/19/18 0634 09/20/18 0633  WBC 9.2 6.2 4.8  HGB 12.0* 10.2* 9.4*  HCT 38.2* 32.3* 30.5*  PLT 356 276 266   BMET Recent Labs    09/18/18 0132 09/19/18 0634 09/20/18 0633  NA 132* 135 135  K 4.9 5.1 4.3  CL 93* 103 99  CO2 26 26 27   GLUCOSE 214* 152* 133*  BUN 21 25* 25*  CREATININE 1.21 1.08 0.95  CALCIUM 9.9 9.4 9.3   LFT Recent Labs    09/20/18 0633  PROT 7.4  ALBUMIN 3.5  AST 22  ALT 22  ALKPHOS 57  BILITOT 0.3   PT/INR No results for input(s): LABPROT, INR in the last 72 hours. Hepatitis Panel No results for input(s): HEPBSAG, HCVAB, HEPAIGM, HEPBIGM in the last 72 hours. C-Diff No results for input(s): CDIFFTOX in the last 72 hours.  Studies/Results: Dg Small Bowel W Single Cm (sol Or Thin Ba)  Result Date: 09/19/2018 CLINICAL DATA:  Abdominal distension, chronic bowel dilatation, question obstruction EXAM: SMALL BOWEL SERIES COMPARISON:  CT abdomen and pelvis 09/18/2018 TECHNIQUE: Following ingestion of thin barium, serial  small bowel images were obtained including spot views of the terminal ileum. FLUOROSCOPY TIME:  Fluoroscopy Time:  None Radiation Exposure Index (if provided by the fluoroscopic device): 8.9 mGy Number of Acquired Spot Images: 8 FINDINGS: Cholecystostomy tube RIGHT upper quadrant. Extensive gaseous distention of large and small bowel loops throughout abdomen. Gaseous distention of stomach. Stool in LEFT colon. Multiple large LEFT renal calculi. Bones demineralized. Poor emptying of contrast from the stomach initially likely due to supine positioning. Images were obtained for nearly 23 hours. Images demonstrate gradual emptying of contrast from the stomach into small bowel loops, which appears significantly dilated. Proximal small bowel loops are located in the RIGHT upper quadrant suggesting at least a partial small bowel mal rotation. At 7 hours contrast has entered the RIGHT colon. Delayed image at 22.75 hours shows significant contrast throughout the colon with residual contrast identified within both dilated and nondilated small bowel loops in the mid abdomen. No definite bowel wall thickening or evidence of obstruction. Decreased gaseous distention of the colon at 22.75 hours. IMPRESSION: Significantly dilated small bowel loops though no evidence of bowel obstruction is identified. Cholelithiasis. Electronically Signed   By: Lavonia Dana M.D.   On: 09/19/2018 08:48     Impression:   Today is the first time I have seen him.  He looks good - all things considered. 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  Recommendations: Continue Motegrity 2 mg daily Periodic assessment of electrolytes.  [facility] the [facility]  Anticipate discharge in the next 24 to 48 hrs.  From a GI standpoint, he can go anytime.  Follow-up with Dr. Karilyn Cotaehman in the coming weeks.

## 2018-09-22 ENCOUNTER — Non-Acute Institutional Stay (SKILLED_NURSING_FACILITY): Payer: Medicare Other | Admitting: Internal Medicine

## 2018-09-22 ENCOUNTER — Encounter: Payer: Self-pay | Admitting: Internal Medicine

## 2018-09-22 DIAGNOSIS — N390 Urinary tract infection, site not specified: Secondary | ICD-10-CM | POA: Diagnosis not present

## 2018-09-22 DIAGNOSIS — K810 Acute cholecystitis: Secondary | ICD-10-CM

## 2018-09-22 DIAGNOSIS — N401 Enlarged prostate with lower urinary tract symptoms: Secondary | ICD-10-CM | POA: Diagnosis not present

## 2018-09-22 DIAGNOSIS — K567 Ileus, unspecified: Secondary | ICD-10-CM

## 2018-09-22 DIAGNOSIS — R319 Hematuria, unspecified: Secondary | ICD-10-CM

## 2018-09-22 DIAGNOSIS — N138 Other obstructive and reflux uropathy: Secondary | ICD-10-CM

## 2018-09-22 NOTE — Progress Notes (Signed)
Provider:  Einar CrowAnjali Makyia Erxleben, MD Location:  [facility] Room Number: 155 P Place of Service:  SNF (31)  PCP: [name], [name], MD Patient Care Team: [name], [name], MD as PCP - General (Internal Medicine)  Extended Emergency Contact Information Primary Emergency Contact: Long,[name] Address: [address]          [place], [place] [id number] [place] Home Phone: [phone] Mobile Phone: [phone] Relation: Aunt  Code Status: Full Code Goals of Care: Advanced Directive information Advanced Directives 09/22/2018  Does Patient Have a Medical Advance Directive? No  Type of Advance Directive -  Does patient want to make changes to medical advance directive? No - Patient declined  Copy of Healthcare Power of Attorney in Chart? -  Would patient like information on creating a medical advance directive? No - Guardian declined      Chief Complaint  Patient presents with  . Readmit To SNF    Readmission    HPI: Patient is a 68 y.o. male seen today for Readmission to SNF for therapy He was readmitted in the hospital from 06/04-06/06 for Recurrent Ileus Patient was also  in the hospital from 5/11-5/21 for SBO and then again was readmitted from 5/22-5/26 for Ileus. Patient has PMH of intellectual disability, type 2 diabetes, BPH. He was also admitted in February of this year for acute gangrenous cholecystitis.  And now has a percutaneous drain in place.  He underwent Laparotomy to reduce Volvulus on 5/12 He was send to ED for abdominal distension . His repeat CT Scan of Abdomen Showed Diffusely dilated and air-filled loops of small bowel similar to prior CT and may represent an ileus Was seen by GI this time which diagnosed him with GI dysmotility and Pseudobstruction He was started on Prucalopride and Flagyl to treat Bacterial Overgrowth He responded well And is now back in SNF for therapy Patient is unable to give any history due to his Aphasia  But does not  have any abdominal pain nausea or vomiting per nurses is having bowel movement and is eating. He lives with his aunt.  And is dependent for his ADLs.    Past Medical History:  Diagnosis Date  . Diabetes mellitus without complication (HCC)   . History of gout   . History of kidney stones   . Hypertension   . Mentally challenged   . Urinary retention    Past Surgical History:  Procedure Laterality Date  . CYSTOSCOPY W/ URETERAL STENT PLACEMENT Bilateral 06/20/2016   Procedure: CYSTOSCOPY WITH RETROGRADE PYELOGRAM/URETERAL STENT PLACEMENT;  Surgeon: Malen GauzePatrick L McKenzie, MD;  Location: [facility];  Service: Urology;  Laterality: Bilateral;  . CYSTOSCOPY WITH RETROGRADE PYELOGRAM, URETEROSCOPY AND STENT PLACEMENT Bilateral 08/20/2016   Procedure: CYSTOSCOPY WITH BILATERAL RETROGRADE PYELOGRAM, BILATERAL URETEROSCOPY,  LASER LITHOTRIPSY OF RIGHT URETERAL CALCULI, STONE BASKET EXTRACTION LEFT URETERAL CALCULI  AND BILATERAL URETERAL STENT EXCHANGE;  Surgeon: Malen GauzeMcKenzie, Patrick L, MD;  Location: [facility];  Service: Urology;  Laterality: Bilateral;  . HOLMIUM LASER APPLICATION Bilateral 08/20/2016   Procedure: HOLMIUM LASER APPLICATION;  Surgeon: Malen GauzeMcKenzie, Patrick L, MD;  Location: [facility];  Service: Urology;  Laterality: Bilateral;  . IR CATHETER TUBE CHANGE  07/04/2018  . IR EXCHANGE BILIARY DRAIN  06/09/2018  . IR PERC CHOLECYSTOSTOMY  05/24/2018  . LAPAROTOMY N/A 08/25/2018   Procedure: EXPLORATORY LAPAROTOMY reduction of volvulus;  Surgeon: Lucretia RoersBridges, Lindsay C, MD;  Location: [facility];  Service: General;  Laterality: N/A;    reports that he has  never smoked. He has never used smokeless tobacco. He reports that he does not drink alcohol or use drugs. Social History   Socioeconomic History  . Marital status: Single    Spouse name: Not on file  . Number of children: Not on file  . Years of education: Not on file  . Highest education level: Not on file  Occupational History  . Not on file  Social Needs  .  Financial resource strain: Not on file  . Food insecurity:    Worry: Not on file    Inability: Not on file  . Transportation needs:    Medical: Not on file    Non-medical: Not on file  Tobacco Use  . Smoking status: Never Smoker  . Smokeless tobacco: Never Used  Substance and Sexual Activity  . Alcohol use: No  . Drug use: No  . Sexual activity: Never    Birth control/protection: None  Lifestyle  . Physical activity:    Days per week: Not on file    Minutes per session: Not on file  . Stress: Not on file  Relationships  . Social connections:    Talks on phone: Not on file    Gets together: Not on file    Attends religious service: Not on file    Active member of club or organization: Not on file    Attends meetings of clubs or organizations: Not on file    Relationship status: Not on file  . Intimate partner violence:    Fear of current or ex partner: Not on file    Emotionally abused: Not on file    Physically abused: Not on file    Forced sexual activity: Not on file  Other Topics Concern  . Not on file  Social History Narrative   Patient is delayed lives with family    Functional Status Survey:    Family History  Problem Relation Age of Onset  . Gallbladder disease Other     Health Maintenance  Topic Date Due  . OPHTHALMOLOGY EXAM  10/10/2018 (Originally 10/02/1960)  . URINE MICROALBUMIN  10/10/2018 (Originally 10/02/1960)  . COLONOSCOPY  10/10/2018 (Originally 10/02/1999)  . FOOT EXAM  10/13/2018 (Originally [birth year]02/1960)  . TETANUS/TDAP  10/13/2018 (Originally 10/01/1968)  . Hepatitis C Screening  10/13/2018 (Originally 1951-01-06)  . HEMOGLOBIN A1C  10/09/2018  . INFLUENZA VACCINE  11/15/2018  . PNA vac Low Risk Adult (2 of 2 - PCV13) 05/27/2019    No Known Allergies  Outpatient Encounter Medications as of 09/22/2018  Medication Sig  . Acetaminophen (TYLENOL ARTHRITIS PAIN PO) Take 1 tablet by mouth every 6 (six) hours as needed (pain).  Marland Kitchen. aspirin EC 81 MG  tablet Take 81 mg by mouth every morning.   Lucilla Lame. Balsam [place]-Castor Oil (VENELEX) OINT Apply topically to sacrum and bilateral buttocks every shift and as needed for blanchable erythema and prevention  . cefdinir (OMNICEF) 300 MG capsule Take 1 capsule (300 mg total) by mouth 2 (two) times daily for 5 days.  . finasteride (PROSCAR) 5 MG tablet Take 1 tablet (5 mg total) by mouth daily for 30 days.  . magnesium oxide (MAG-OX) 400 MG tablet Take 1 tablet (400 mg total) by mouth daily for 30 days.  . metFORMIN (GLUCOPHAGE) 500 MG tablet Take 1,000 mg by mouth every morning.  . metroNIDAZOLE (FLAGYL) 250 MG tablet Take 1 tablet (250 mg total) by mouth 3 (three) times daily for 5 days.  . NON FORMULARY Diet Type:  Soft  Diet, NAS, Cons CHO  . Nutritional Supplements (ENSURE ENLIVE PO) Take 1 Bottle by mouth 3 (three) times daily.  . phosphorus (K PHOS NEUTRAL) 155-852-130 MG tablet Take 1 tablet (250 mg total) by mouth daily.  . Probiotic Product (RISA-BID PROBIOTIC PO) Take 1 capsule by mouth 2 (two) times a day.  . Prucalopride Succinate 2 MG TABS Take 2 mg by mouth daily for 30 days.  . simethicone (MYLICON) 40 MG/0.6ML drops Take 0.6 mLs (40 mg total) by mouth 4 (four) times daily.  . tamsulosin (FLOMAX) 0.4 MG CAPS capsule Take 1 capsule (0.4 mg total) by mouth every evening.   No facility-administered encounter medications on file as of 09/22/2018.     Review of Systems  Unable to perform ROS: Patient nonverbal    Vitals:   09/22/18 1104  BP: 101/61  Pulse: (!) 55  Resp: 18  Temp: 97.9 F (36.6 C)  Weight: 145 lb (65.8 kg)  Height: 5\' 7"  (1.702 m)   Body mass index is 22.71 kg/m. Physical Exam Vitals signs reviewed.  Constitutional:      Appearance: Normal appearance.  HENT:     Head: Normocephalic.     Nose: Nose normal.     Mouth/Throat:     Mouth: Mucous membranes are moist.     Pharynx: Oropharynx is clear.  Eyes:     Pupils: Pupils are equal, round, and reactive to light.   Neck:     Musculoskeletal: Neck supple.  Cardiovascular:     Rate and Rhythm: Normal rate and regular rhythm.     Pulses: Normal pulses.  Pulmonary:     Effort: Pulmonary effort is normal.     Breath sounds: Normal breath sounds.  Abdominal:     General: There is distension.     Palpations: Abdomen is soft.     Comments: BS decreased. Not tender  Musculoskeletal:        General: No swelling.  Skin:    General: Skin is warm and dry.  Neurological:     General: No focal deficit present.     Mental Status: He is alert.  Psychiatric:        Mood and Affect: Mood normal.        Thought Content: Thought content normal.     Labs reviewed: Basic Metabolic Panel: Recent Labs    09/03/18 0452 09/04/18 0641  09/09/18 0801 09/15/18 0700 09/18/18 0132 09/18/18 0717 09/19/18 0634 09/20/18 0633  NA 142 140   < > 138 136 132*  --  135 135  K 3.1* 3.6   < > 3.3* 4.5 4.9  --  5.1 4.3  CL 110 105   < > 102 99 93*  --  103 99  CO2 29 27   < > 28 27 26   --  26 27  GLUCOSE 130* 144*   < > 149* 148* 214*  --  152* 133*  BUN 14 8   < > 7* 14 21  --  25* 25*  CREATININE 0.98 0.93   < > 0.89 1.00 1.21  --  1.08 0.95  CALCIUM 7.9* 8.6*   < > 8.1* 9.3 9.9  --  9.4 9.3  MG 2.0 1.9   < > 1.6* 1.9 2.0  --   --   --   PHOS 2.2* 2.7  --   --   --   --  4.0  --   --    < > = values in  this interval not displayed.   Liver Function Tests: Recent Labs    09/18/18 0132 09/19/18 0634 09/20/18 0633  AST 22 16 22   ALT 24 20 22   ALKPHOS 75 59 57  BILITOT 0.3 0.5 0.3  PROT 8.6* 7.6 7.4  ALBUMIN 4.0 3.5 3.5   Recent Labs    08/25/18 2020 09/05/18 1019 09/18/18 0132  LIPASE 30 40 35   No results for input(s): AMMONIA in the last 8760 hours. CBC: Recent Labs    09/07/18 0432 09/08/18 0416  09/18/18 0132 09/19/18 0634 09/20/18 0633  WBC 11.8* 10.9*   < > 9.2 6.2 4.8  NEUTROABS 9.8* 8.4*  --  7.4  --   --   HGB 9.1* 8.7*   < > 12.0* 10.2* 9.4*  HCT 29.1* 27.6*   < > 38.2* 32.3*  30.5*  MCV 91.5 91.7   < > 89.7 90.5 91.3  PLT 235 225   < > 356 276 266   < > = values in this interval not displayed.   Cardiac Enzymes: Recent Labs    05/24/18 0403 05/24/18 1035 05/24/18 1639  TROPONINI 0.04* 0.03* 0.03*   BNP: Invalid input(s): POCBNP Lab Results  Component Value Date   HGBA1C 8.0 (H) 04/09/2018   Lab Results  Component Value Date   TSH 1.008 06/19/2016   No results found for: VITAMINB12 No results found for: FOLATE No results found for: IRON, TIBC, FERRITIN  Imaging and Procedures obtained prior to SNF admission: Dg Abd Acute 2+v W 1v Chest  Result Date: 09/20/2018 CLINICAL DATA:  Abdominal distension. Small-bowel follow-through yesterday. EXAM: DG ABDOMEN ACUTE W/ 1V CHEST COMPARISON:  09/18/2018 small-bowel follow-through FINDINGS: Frontal view of the chest demonstrates midline trachea. Borderline cardiomegaly. No pleural effusion or pneumothorax. Right base volume loss and subsegmental atelectasis or scar. Clear left lung. Cholecystostomy tube. Contrast within normal caliber colon. No free intraperitoneal air. Density difference about the medial lower right chest is favored to be due to volume loss adjacent aerated lung. Gas-filled loops of small bowel, including at up to 4.8 cm. Radiation seeds in the prostate. IMPRESSION: Gas-filled loops of small bowel, with normal caliber colon, contrast filled. Favor adynamic ileus. No evidence of high-grade obstruction. Probable artifactual lucency along the right hemidiaphragm, secondary to medial right lung base volume loss adjacent to aerated lung. If there is a concern of free intraperitoneal air, recommend decubitus radiographs. Electronically Signed   By: Abigail Miyamoto M.D.   On: 09/20/2018 13:37    Assessment/Plan  Recurrent Ileus Per GI it is due to gastric dysmotility and pseudoobstruction On Prucalopride  Also On Flagyl for 5 days due to Bacterial Overgrowth Continue to get him up  Follow up with GI SBO  (small bowel obstruction) / Volvulus S/P Reduction with Exlap Patient eating and Moving his Bowels Follow up with Surgery  Incision has Healed well  Acute Renal Insuff Creat Back to normal BMP repeat  Diabetes mellitus type 2,  Continue on Metformin  Gangrenous cholecystitis Has Drain  Benign prostatic hyperplasia with urinary obstruction Chronic Foley Also on Flomax and Proscar Anemia Hgb Stable  Hypokalemia and Hypomagnesia Resolved Repeat BMP Deconditioning Working with therapy UTI  On Cefdinir for 5 Days  Family/ staff Communication:   Labs/tests ordered:BMP and CBC  Total time spent in this patient care encounter was  45_  minutes; greater than 50% of the visit spent counseling  staff, reviewing records , Labs and coordinating care for problems addressed at this encounter.

## 2018-09-23 LAB — URINE CULTURE: Culture: >=100000 — AB

## 2018-09-24 ENCOUNTER — Encounter (INDEPENDENT_AMBULATORY_CARE_PROVIDER_SITE_OTHER): Payer: Self-pay | Admitting: Internal Medicine

## 2018-09-24 ENCOUNTER — Ambulatory Visit (INDEPENDENT_AMBULATORY_CARE_PROVIDER_SITE_OTHER): Payer: Medicare Other | Admitting: Internal Medicine

## 2018-09-24 ENCOUNTER — Ambulatory Visit (INDEPENDENT_AMBULATORY_CARE_PROVIDER_SITE_OTHER): Payer: Medicare Other | Admitting: Urology

## 2018-09-24 VITALS — BP 113/76 | HR 76 | Temp 97.6°F | Ht 67.0 in | Wt 151.0 lb

## 2018-09-24 DIAGNOSIS — K9189 Other postprocedural complications and disorders of digestive system: Secondary | ICD-10-CM

## 2018-09-24 DIAGNOSIS — R338 Other retention of urine: Secondary | ICD-10-CM

## 2018-09-24 DIAGNOSIS — N401 Enlarged prostate with lower urinary tract symptoms: Secondary | ICD-10-CM | POA: Diagnosis not present

## 2018-09-24 DIAGNOSIS — K567 Ileus, unspecified: Secondary | ICD-10-CM | POA: Diagnosis not present

## 2018-09-24 NOTE — Progress Notes (Signed)
Subjective:    Patient ID: [name], male    DOB: 05-29-1950, 68 y.o.   MRN: [id number]  HPI Here today for f/u. Recently admitted to [facility] in June of this year with ileus. Evaulated by Dr. Karilyn Cotaehman and advised to use Simeticone  four times a day.  [facility] Per Caregiver in room, bowel are moving daily and maybe more than. His appetite is 50-50. When his aunt is with him, he is eating 100%. Per [facility], [facility].  He is helped to the BR to have a BM.  Underwent an exploratory laparotomy with reduction of volvulus (Pre op diagnosis: High grade small bowel obstruction with pneumatosis) by Dr. [name] in May of this year.  Admitted to hospital in June with N, V, Abdominal pain, and no BM x 4 days. Ct scan concerning for high grade small bowel obstruction with pneumatosis in the mesenteric vessels concerning for ischemic bowel.   Review of Systems Past Medical History:  Diagnosis Date  . Diabetes mellitus without complication (HCC)   . History of gout   . History of kidney stones   . Hypertension   . Mentally challenged   . Urinary retention     Past Surgical History:  Procedure Laterality Date  . CYSTOSCOPY W/ URETERAL STENT PLACEMENT Bilateral 06/20/2016   Procedure: CYSTOSCOPY WITH RETROGRADE PYELOGRAM/URETERAL STENT PLACEMENT;  Surgeon: Malen GauzePatrick L McKenzie, MD;  Location: [facility];  Service: Urology;  Laterality: Bilateral;  . CYSTOSCOPY WITH RETROGRADE PYELOGRAM, URETEROSCOPY AND STENT PLACEMENT Bilateral 08/20/2016   Procedure: CYSTOSCOPY WITH BILATERAL RETROGRADE PYELOGRAM, BILATERAL URETEROSCOPY,  LASER LITHOTRIPSY OF RIGHT URETERAL CALCULI, STONE BASKET EXTRACTION LEFT URETERAL CALCULI  AND BILATERAL URETERAL STENT EXCHANGE;  Surgeon: Malen GauzeMcKenzie, Patrick L, MD;  Location: [facility];  Service: Urology;  Laterality: Bilateral;  . HOLMIUM LASER APPLICATION Bilateral 08/20/2016   Procedure: HOLMIUM LASER APPLICATION;  Surgeon: Malen GauzeMcKenzie, Patrick L, MD;   Location: [facility];  Service: Urology;  Laterality: Bilateral;  . IR CATHETER TUBE CHANGE  07/04/2018  . IR EXCHANGE BILIARY DRAIN  06/09/2018  . IR PERC CHOLECYSTOSTOMY  05/24/2018  . LAPAROTOMY N/A 08/25/2018   Procedure: EXPLORATORY LAPAROTOMY reduction of volvulus;  Surgeon: Lucretia RoersBridges, Lindsay C, MD;  Location: [facility];  Service: General;  Laterality: N/A;    No Known Allergies  Current Outpatient Medications on File Prior to Visit  Medication Sig Dispense Refill  . Acetaminophen (TYLENOL ARTHRITIS PAIN PO) Take 1 tablet by mouth every 6 (six) hours as needed (pain).    Marland Kitchen. aspirin EC 81 MG tablet Take 81 mg by mouth every morning.     Lucilla Lame. Balsam [place]-Castor Oil (VENELEX) OINT Apply topically to sacrum and bilateral buttocks every shift and as needed for blanchable erythema and prevention    . finasteride (PROSCAR) 5 MG tablet Take 1 tablet (5 mg total) by mouth daily for 30 days. 30 tablet 0  . magnesium oxide (MAG-OX) 400 MG tablet Take 1 tablet (400 mg total) by mouth daily for 30 days. 30 tablet 0  . metFORMIN (GLUCOPHAGE) 500 MG tablet Take 1,000 mg by mouth every morning.  3  . NON FORMULARY Diet Type:  Soft Diet, NAS, Cons CHO    . phosphorus (K PHOS NEUTRAL) 155-852-130 MG tablet Take 1 tablet (250 mg total) by mouth daily. 7 tablet 0  . Potassium Chloride ER 20 MEQ TBCR Take 20 mEq by mouth 2 (two) times a day.    . Probiotic  Product (RISA-BID PROBIOTIC PO) Take 1 capsule by mouth 2 (two) times a day.    . simethicone (MYLICON) 40 WI/2.0BT drops Take 0.6 mLs (40 mg total) by mouth 4 (four) times daily. 30 mL 0  . tamsulosin (FLOMAX) 0.4 MG CAPS capsule Take 1 capsule (0.4 mg total) by mouth every evening. 30 capsule 1  . Nutritional Supplements (ENSURE ENLIVE PO) Take 1 Bottle by mouth 3 (three) times daily.     No current facility-administered medications on file prior to visit.         Objective:   Physical Exam Blood pressure 113/76, pulse 76, temperature 97.6 F (36.4 C), height  5\' 7"  (1.702 m), weight 151 lb (68.5 kg).  Alert and oriented. Skin warm and dry. Oral mucosa is moist.   . Sclera anicteric, conjunctivae is pink. Thyroid not enlarged. No cervical lymphadenopathy. Lungs clear. Heart regular rate and rhythm.  Abdomen is soft. Bowel sounds are positive. No hepatomegaly. No abdominal masses felt. No tenderness.  No edema to lower extremities.  No abdominal distention        Assessment & Plan:  Ileus. Seems to be doing better. BS +. Abdomen is soft and not distended.  Aunt was in room. Continue the Simethicone and ambulation.

## 2018-09-24 NOTE — Patient Instructions (Signed)
Continue physical activity. Simeticone 4 times a day.

## 2018-09-25 ENCOUNTER — Emergency Department (HOSPITAL_COMMUNITY)
Admission: EM | Admit: 2018-09-25 | Discharge: 2018-09-26 | Disposition: A | Discharge status: Skilled Nursing Facility | Payer: Medicare Other | Attending: Emergency Medicine | Admitting: Emergency Medicine

## 2018-09-25 ENCOUNTER — Other Ambulatory Visit: Payer: Self-pay

## 2018-09-25 ENCOUNTER — Encounter (HOSPITAL_COMMUNITY): Payer: Self-pay

## 2018-09-25 DIAGNOSIS — Z7982 Long term (current) use of aspirin: Secondary | ICD-10-CM | POA: Insufficient documentation

## 2018-09-25 DIAGNOSIS — R140 Abdominal distension (gaseous): Secondary | ICD-10-CM | POA: Diagnosis present

## 2018-09-25 DIAGNOSIS — Z79899 Other long term (current) drug therapy: Secondary | ICD-10-CM | POA: Insufficient documentation

## 2018-09-25 DIAGNOSIS — E119 Type 2 diabetes mellitus without complications: Secondary | ICD-10-CM | POA: Insufficient documentation

## 2018-09-25 DIAGNOSIS — K567 Ileus, unspecified: Secondary | ICD-10-CM | POA: Insufficient documentation

## 2018-09-25 DIAGNOSIS — I10 Essential (primary) hypertension: Secondary | ICD-10-CM | POA: Diagnosis not present

## 2018-09-25 DIAGNOSIS — F79 Unspecified intellectual disabilities: Secondary | ICD-10-CM | POA: Diagnosis not present

## 2018-09-25 DIAGNOSIS — Z9049 Acquired absence of other specified parts of digestive tract: Secondary | ICD-10-CM | POA: Diagnosis not present

## 2018-09-25 DIAGNOSIS — Z7984 Long term (current) use of oral hypoglycemic drugs: Secondary | ICD-10-CM | POA: Insufficient documentation

## 2018-09-25 NOTE — ED Provider Notes (Signed)
[facility] EMERGENCY DEPARTMENT Provider Note   CSN: [id number] Arrival date & time: 09/25/18  2329     History   Chief Complaint Chief Complaint  Patient presents with  . Abdominal Pain    Distension    HPI [name] is a 68 y.o. male.     Patient is a 68 year old male with past medical history of recent cholecystectomy and ileus presenting with complaints of abdominal distention and discomfort.  Patient also has history of intellectual deficiency and capacity to communicate is limited.  He can only shake and nod his head.  History is limited secondary to above.  The history is provided by the patient.  Abdominal Pain Pain location:  Generalized Pain quality: bloating and cramping   Pain radiates to:  Does not radiate Pain severity:  Moderate Onset quality:  Sudden Duration:  1 day Timing:  Constant Progression:  Worsening Chronicity:  Recurrent Relieved by:  Nothing Worsened by:  Nothing Ineffective treatments:  None tried   Past Medical History:  Diagnosis Date  . Diabetes mellitus without complication ([employer])   . History of gout   . History of kidney stones   . Hypertension   . Mentally challenged   . Urinary retention     Patient Active Problem List   Diagnosis Date Noted  . Acute lower UTI 09/18/2018  . Abdominal distention 09/18/2018  . Hypomagnesemia 09/09/2018  . Protein-calorie malnutrition, severe ([place]) 09/09/2018  . Acute blood loss anemia 09/09/2018  . Gangrenous cholecystitis 09/05/2018  . Volvulus ([place]) 09/03/2018  . Ileus ([place])   . Pneumatosis of intestines   . Gall bladder disease 08/25/2018  . Small bowel ischemia ([place])   . Acute gangrenous cholecystitis 05/24/2018  . SBO (small bowel obstruction) ([place]) 05/24/2018  . Abdominal distension   . Pressure injury of skin 04/09/2018  . C. difficile diarrhea 04/08/2018  . Bilateral hydronephrosis 04/08/2018  . Pneumoperitoneum of unknown etiology   . Diarrhea 04/07/2018  .  Nonverbal 04/07/2018  . Hydronephrosis with urinary obstruction due to ureteral calculus 06/20/2016  . Sepsis secondary to UTI ([place]) 06/20/2016  . Fever 06/19/2016  . Generalized weakness 06/19/2016  . Diabetes mellitus type 2, noninsulin dependent ([place]) 06/19/2016  . Severe sepsis with septic shock ([place]) 06/19/2016  . UTI (urinary tract infection) 06/19/2016  . Hematuria 06/19/2016  . AKI (acute kidney injury) ([place]) 06/19/2016  . Hyponatremia 06/19/2016  . Hypokalemia 06/19/2016  . BPH (benign prostatic hyperplasia) 06/19/2016  . 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 Urinary retention 05/07/2014  . Intellectual disability 05/07/2014  . Hyperlipidemia 05/07/2014  . HTN (hypertension) 05/07/2014    Past Surgical History:  Procedure Laterality Date  . CYSTOSCOPY W/ URETERAL STENT PLACEMENT Bilateral 06/20/2016   Procedure: CYSTOSCOPY WITH RETROGRADE PYELOGRAM/URETERAL STENT PLACEMENT;  Surgeon: Cleon Gustin, MD;  Location: [facility];  Service: Urology;  Laterality: Bilateral;  . CYSTOSCOPY WITH RETROGRADE PYELOGRAM, URETEROSCOPY AND STENT PLACEMENT Bilateral 08/20/2016   Procedure: CYSTOSCOPY WITH BILATERAL RETROGRADE PYELOGRAM, BILATERAL URETEROSCOPY,  LASER LITHOTRIPSY OF RIGHT URETERAL CALCULI, STONE BASKET EXTRACTION LEFT URETERAL CALCULI  AND BILATERAL URETERAL STENT EXCHANGE;  Surgeon: Cleon Gustin, MD;  Location: [facility];  Service: Urology;  Laterality: Bilateral;  . HOLMIUM LASER APPLICATION Bilateral 11/20/5782   Procedure: HOLMIUM LASER APPLICATION;  Surgeon: Cleon Gustin, MD;  Location: [facility];  Service: Urology;  Laterality: Bilateral;  . IR CATHETER TUBE CHANGE  07/04/2018  . IR EXCHANGE BILIARY DRAIN  06/09/2018  . IR PERC CHOLECYSTOSTOMY  05/24/2018  . LAPAROTOMY N/A 08/25/2018   Procedure: EXPLORATORY LAPAROTOMY reduction of volvulus;  Surgeon: Lucretia RoersBridges, Lindsay C, MD;  Location: [facility];  Service: General;  Laterality: N/A;        Home Medications     Prior to Admission medications   Medication Sig Start Date End Date Taking? Authorizing Provider  Acetaminophen (TYLENOL ARTHRITIS PAIN PO) Take 1 tablet by mouth every 6 (six) hours as needed (pain).    [provider]  aspirin EC 81 MG tablet Take 81 mg by mouth every morning.     [provider]  Despina HiddenBalsam Peru-Castor Oil [facility](VENELEX) OINT Apply topically to sacrum and bilateral buttocks every shift and as needed for blanchable erythema and prevention 09/10/18   [provider]  finasteride (PROSCAR) 5 MG tablet Take 1 tablet (5 mg total) by mouth daily for 30 days. 09/10/18 10/10/18  Sherryll BurgerShah, Pratik D, DO  magnesium oxide (MAG-OX) 400 MG tablet Take 1 tablet (400 mg total) by mouth daily for 30 days. 09/09/18 10/09/18  Sherryll BurgerShah, Pratik D, DO  metFORMIN (GLUCOPHAGE) 500 MG tablet Take 1,000 mg by mouth every morning. 02/03/18   [provider]  NON FORMULARY Diet Type:  Soft Diet, NAS, Cons CHO 09/09/18   [provider]  Nutritional Supplements (ENSURE ENLIVE PO) Take 1 Bottle by mouth 3 (three) times daily. 09/17/18   [provider]  phosphorus (K PHOS NEUTRAL) 155-852-130 MG tablet Take 1 tablet (250 mg total) by mouth daily. 09/04/18   Catarina Hartshornat, David, MD  Potassium Chloride ER 20 MEQ TBCR Take 20 mEq by mouth 2 (two) times a day.    [provider]  Probiotic Product (RISA-BID PROBIOTIC PO) Take 1 capsule by mouth 2 (two) times a day.    [provider]  simethicone (MYLICON) 40 MG/0.6ML drops Take 0.6 mLs (40 mg total) by mouth 4 (four) times daily. 09/04/18   Catarina Hartshornat, David, MD  tamsulosin (FLOMAX) 0.4 MG CAPS capsule Take 1 capsule (0.4 mg total) by mouth every evening. 04/12/18   Erick BlinksMemon, Jehanzeb, MD    Family History Family History  Problem Relation Age of Onset  . Gallbladder disease Other     Social History Social History   Tobacco Use  . Smoking status: Never Smoker  . Smokeless tobacco: Never Used  Substance Use Topics  . Alcohol  use: No  . Drug use: No     Allergies   Patient has no known allergies.   Review of Systems Review of Systems  Gastrointestinal: Positive for abdominal pain.  All other systems reviewed and are negative.    Physical Exam Updated Vital Signs BP 117/77 (BP Location: Left Arm)   Pulse 78   Temp 98.6 F (37 C) (Oral)   Resp 19   SpO2 96%   Physical Exam Vitals signs and nursing note reviewed.  Constitutional:      General: He is not in acute distress.    Appearance: He is well-developed. He is not diaphoretic.  HENT:     Head: Normocephalic and atraumatic.  Neck:     Musculoskeletal: Normal range of motion and neck supple.  Cardiovascular:     Rate and Rhythm: Normal rate and regular rhythm.     Heart sounds: No murmur. No friction rub.  Pulmonary:     Effort: Pulmonary effort is normal. No respiratory distress.     Breath sounds: Normal breath sounds. No wheezing or rales.  Abdominal:     General: Bowel sounds are  normal. There is no distension.     Palpations: Abdomen is soft.     Tenderness: There is abdominal tenderness in the right upper quadrant, epigastric area and left upper quadrant.     Comments: There is mild tenderness across the upper abdomen.  The abdomen is somewhat distended, but soft.  It is somewhat tympanitic to palpation.  Musculoskeletal: Normal range of motion.  Skin:    General: Skin is warm and dry.  Neurological:     Mental Status: He is alert and oriented to person, place, and time.     Coordination: Coordination normal.      ED Treatments / Results  Labs (all labs ordered are listed, but only abnormal results are displayed) Labs Reviewed  COMPREHENSIVE METABOLIC PANEL  LIPASE, BLOOD  CBC WITH DIFFERENTIAL/PLATELET  URINALYSIS, ROUTINE W REFLEX MICROSCOPIC    EKG    Radiology No results found.  Procedures Procedures (including critical care time)  Medications Ordered in ED Medications - No data to display   Initial  Impression / Assessment and Plan / ED Course  I have reviewed the triage vital signs and the nursing notes.  Pertinent labs & imaging results that were available during my care of the patient were reviewed by me and considered in my medical decision making (see chart for details).  Patient with history of recent cholecystectomy/ileus presenting with abdominal distention and reported discomfort.  Patient adds little history secondary to baseline mental functioning.  His work-up today shows him to be afebrile with no leukocytosis.  CT scan does show what appears to be an ileus, but felt not to be a small bowel obstruction as barium that was administered last week has made its way to the rectum.  Patient is nontoxic-appearing.  His abdomen does have some tympany, however is not rigid.  At this point, I do not feel as though there is a surgical process.  Patient likely would benefit from a laxative/simethicone combination.  I feel as though he is appropriate for return to his extended care facility with these medications.  Final Clinical Impressions(s) / ED Diagnoses   Final diagnoses:  None    ED Discharge Orders    None       Geoffery Lyonselo, Sefora Tietje, MD 09/26/18 [phone]

## 2018-09-25 NOTE — ED Triage Notes (Signed)
Pt with history of recent cholecystectomy and ileus, tonight with onset of abd distension and pain. No vomiting noted.  Pt is awake and alert, is not verbal (per his normal).  Pt nods his head when asked if having abd pain.

## 2018-09-26 ENCOUNTER — Non-Acute Institutional Stay (SKILLED_NURSING_FACILITY): Payer: Medicare Other | Admitting: Adult Health

## 2018-09-26 ENCOUNTER — Encounter: Payer: Self-pay | Admitting: Adult Health

## 2018-09-26 ENCOUNTER — Emergency Department (HOSPITAL_COMMUNITY): Payer: Medicare Other

## 2018-09-26 ENCOUNTER — Inpatient Hospital Stay
Admission: RE | Admit: 2018-09-26 | Discharge: 2018-10-24 | Disposition: A | Discharge status: Home / Self Care | Payer: Medicare Other | Source: Ambulatory Visit | Attending: Internal Medicine | Admitting: Internal Medicine

## 2018-09-26 DIAGNOSIS — K56609 Unspecified intestinal obstruction, unspecified as to partial versus complete obstruction: Secondary | ICD-10-CM

## 2018-09-26 DIAGNOSIS — K567 Ileus, unspecified: Secondary | ICD-10-CM | POA: Diagnosis not present

## 2018-09-26 DIAGNOSIS — Z434 Encounter for attention to other artificial openings of digestive tract: Principal | ICD-10-CM

## 2018-09-26 DIAGNOSIS — R140 Abdominal distension (gaseous): Secondary | ICD-10-CM

## 2018-09-26 DIAGNOSIS — K562 Volvulus: Secondary | ICD-10-CM | POA: Diagnosis not present

## 2018-09-26 LAB — COMPREHENSIVE METABOLIC PANEL
ALT: 30 U/L (ref 0–44)
AST: 24 U/L (ref 15–41)
Albumin: 4 g/dL (ref 3.5–5.0)
Alkaline Phosphatase: 66 U/L (ref 38–126)
Anion gap: 12 (ref 5–15)
BUN: 22 mg/dL (ref 8–23)
CO2: 24 mmol/L (ref 22–32)
Calcium: 9.6 mg/dL (ref 8.9–10.3)
Chloride: 97 mmol/L — ABNORMAL LOW (ref 98–111)
Creatinine, Ser: 1.02 mg/dL (ref 0.61–1.24)
GFR calc Af Amer: >60 mL/min (ref >60)
GFR calc non Af Amer: >60 mL/min (ref >60)
Glucose, Bld: 216 mg/dL — ABNORMAL HIGH (ref 70–99)
Potassium: 3.2 mmol/L — ABNORMAL LOW (ref 3.5–5.1)
Sodium: 133 mmol/L — ABNORMAL LOW (ref 135–145)
Total Bilirubin: 0.4 mg/dL (ref 0.3–1.2)
Total Protein: 8.1 g/dL (ref 6.5–8.1)

## 2018-09-26 LAB — CBC WITH DIFFERENTIAL/PLATELET
Abs Immature Granulocytes: 0.03 10*3/uL (ref 0.00–0.07)
Basophils Absolute: 0.1 10*3/uL (ref 0.0–0.1)
Basophils Relative: 1 %
Eosinophils Absolute: 0.1 10*3/uL (ref 0.0–0.5)
Eosinophils Relative: 1 %
HCT: 36.9 % — ABNORMAL LOW (ref 39.0–52.0)
Hemoglobin: 12 g/dL — ABNORMAL LOW (ref 13.0–17.0)
Immature Granulocytes: 0 %
Lymphocytes Relative: 15 %
Lymphs Abs: 1.4 10*3/uL (ref 0.7–4.0)
MCH: 28.8 pg (ref 26.0–34.0)
MCHC: 32.5 g/dL (ref 30.0–36.0)
MCV: 88.5 fL (ref 80.0–100.0)
Monocytes Absolute: 0.5 10*3/uL (ref 0.1–1.0)
Monocytes Relative: 5 %
Neutro Abs: 7.5 10*3/uL (ref 1.7–7.7)
Neutrophils Relative %: 78 %
Platelets: 273 10*3/uL (ref 150–400)
RBC: 4.17 MIL/uL — ABNORMAL LOW (ref 4.22–5.81)
RDW: 14 % (ref 11.5–15.5)
WBC: 9.5 10*3/uL (ref 4.0–10.5)
nRBC: 0 % (ref 0.0–0.2)

## 2018-09-26 LAB — URINALYSIS, ROUTINE W REFLEX MICROSCOPIC
Bilirubin Urine: NEGATIVE
Glucose, UA: NEGATIVE mg/dL
Hgb urine dipstick: NEGATIVE
Ketones, ur: NEGATIVE mg/dL
Nitrite: NEGATIVE
Protein, ur: 100 mg/dL — AB
Specific Gravity, Urine: 1.024 (ref 1.005–1.030)
pH: 5 (ref 5.0–8.0)

## 2018-09-26 LAB — LIPASE, BLOOD: Lipase: 40 U/L (ref 11–51)

## 2018-09-26 MED ORDER — MAGNESIUM CITRATE PO SOLN: 1.0000 | Freq: Once | ORAL | 0 refills | Status: AC

## 2018-09-26 MED ORDER — IOHEXOL 300 MG/ML  SOLN
100.0000 mL | Freq: Once | INTRAMUSCULAR | Status: AC | PRN
  Administered 2018-09-26: 100 mL via INTRAVENOUS

## 2018-09-26 MED ORDER — SIMETHICONE 40 MG/0.6ML PO SUSP: 40.0000 mg | Freq: Four times a day (QID) | ORAL | 0 refills | Status: DC

## 2018-09-26 NOTE — Discharge Instructions (Addendum)
Magnesium citrate for relief of constipation.  Continue simethicone as prescribed for gas.  Follow-up with your surgeon if symptoms or not improving in the next few days and return to the ER for severe pain, high fever, bloody stool, or other new and concerning symptoms.

## 2018-09-26 NOTE — Progress Notes (Signed)
Location:   [facility] Room Number: 127 D Place of Service:  SNF (31)   CODE STATUS: Full Code  No Known Allergies  Chief Complaint  Patient presents with  . Acute Visit    ED follow up    HPI:  He was taken to the ED yesterday for increased abdominal distention. The testing done did not display and sbo; he does have stool retention and gas present. His medications were changed and he has returned. His goal continues to be to return back home. There are no reports of fevers present. There are no reports of changes in appetite; no uncontrolled pain.   Past Medical History:  Diagnosis Date  . Diabetes mellitus without complication ([place])   . History of gout   . History of kidney stones   . Hypertension   . Mentally challenged   . Urinary retention     Past Surgical History:  Procedure Laterality Date  . CYSTOSCOPY W/ URETERAL STENT PLACEMENT Bilateral 06/20/2016   Procedure: CYSTOSCOPY WITH RETROGRADE PYELOGRAM/URETERAL STENT PLACEMENT;  Surgeon: Cleon Gustin, MD;  Location: [facility];  Service: Urology;  Laterality: Bilateral;  . CYSTOSCOPY WITH RETROGRADE PYELOGRAM, URETEROSCOPY AND STENT PLACEMENT Bilateral 08/20/2016   Procedure: CYSTOSCOPY WITH BILATERAL RETROGRADE PYELOGRAM, BILATERAL URETEROSCOPY,  LASER LITHOTRIPSY OF RIGHT URETERAL CALCULI, STONE BASKET EXTRACTION LEFT URETERAL CALCULI  AND BILATERAL URETERAL STENT EXCHANGE;  Surgeon: Cleon Gustin, MD;  Location: [facility];  Service: Urology;  Laterality: Bilateral;  . HOLMIUM LASER APPLICATION Bilateral 06/21/1694   Procedure: HOLMIUM LASER APPLICATION;  Surgeon: Cleon Gustin, MD;  Location: [facility];  Service: Urology;  Laterality: Bilateral;  . IR CATHETER TUBE CHANGE  07/04/2018  . IR EXCHANGE BILIARY DRAIN  06/09/2018  . IR PERC CHOLECYSTOSTOMY  05/24/2018  . LAPAROTOMY N/A 08/25/2018   Procedure: EXPLORATORY LAPAROTOMY reduction of volvulus;  Surgeon: Virl Cagey, MD;   Location: [facility];  Service: General;  Laterality: N/A;    Social History   Socioeconomic History  . Marital status: Single    Spouse name: Not on file  . Number of children: Not on file  . Years of education: Not on file  . Highest education level: Not on file  Occupational History  . Not on file  Social Needs  . Financial resource strain: Not on file  . Food insecurity    Worry: Not on file    Inability: Not on file  . Transportation needs    Medical: Not on file    Non-medical: Not on file  Tobacco Use  . Smoking status: Never Smoker  . Smokeless tobacco: Never Used  Substance and Sexual Activity  . Alcohol use: No  . Drug use: No  . Sexual activity: Never    Birth control/protection: None  Lifestyle  . Physical activity    Days per week: Not on file    Minutes per session: Not on file  . Stress: Not on file  Relationships  . Social Herbalist on phone: Not on file    Gets together: Not on file    Attends religious service: Not on file    Active member of club or organization: Not on file    Attends meetings of clubs or organizations: Not on file    Relationship status: Not on file  . Intimate partner violence    Fear of current or ex partner: Not on file    Emotionally abused: Not on file  Physically abused: Not on file    Forced sexual activity: Not on file  Other Topics Concern  . Not on file  Social History Narrative   Patient is delayed lives with family   Family History  Problem Relation Age of Onset  . Gallbladder disease Other       VITAL SIGNS BP 131/74   Pulse 62   Temp 98.1 F (36.7 C)   Resp 20   Ht 5\' 7"  (1.702 m)   Wt 145 lb (65.8 kg)   BMI 22.71 kg/m   Outpatient Encounter Medications as of 09/26/2018  Medication Sig  . Acetaminophen (TYLENOL ARTHRITIS PAIN PO) Take 1 tablet by mouth every 6 (six) hours as needed (pain).  Marland Kitchen. aspirin EC 81 MG tablet Take 81 mg by mouth every morning.   Lucilla Lame. Balsam Peru-Castor Oil  (VENELEX) OINT Apply topically to sacrum and bilateral buttocks every shift and as needed for blanchable erythema and prevention  . finasteride (PROSCAR) 5 MG tablet Take 1 tablet (5 mg total) by mouth daily for 30 days.  . magnesium citrate SOLN Take 296 mLs (1 Bottle total) by mouth once for 1 dose.  . magnesium oxide (MAG-OX) 400 MG tablet Take 1 tablet (400 mg total) by mouth daily for 30 days.  . metFORMIN (GLUCOPHAGE) 500 MG tablet Take 1,000 mg by mouth every morning.  . NON FORMULARY Diet Type:  Soft Diet, NAS, Cons CHO  . Nutritional Supplements (ENSURE ENLIVE PO) Take 1 Bottle by mouth 3 (three) times daily.  . phosphorus (K PHOS NEUTRAL) 155-852-130 MG tablet Take 1 tablet (250 mg total) by mouth daily.  . Prucalopride Succinate 2 MG TABS Take 1 tablet by mouth daily.  . simethicone (MYLICON) 40 MG/0.6ML drops Take 0.6 mLs (40 mg total) by mouth 4 (four) times daily.  . tamsulosin (FLOMAX) 0.4 MG CAPS capsule Take 1 capsule (0.4 mg total) by mouth every evening.  . [DISCONTINUED] Potassium Chloride ER 20 MEQ TBCR Take 20 mEq by mouth 2 (two) times a day.  . [DISCONTINUED] Probiotic Product (RISA-BID PROBIOTIC PO) Take 1 capsule by mouth 2 (two) times a day.   No facility-administered encounter medications on file as of 09/26/2018.      SIGNIFICANT DIAGNOSTIC EXAMS  PREVIOUS;   08-25-18: ct of abdomen and pelvis:  1. High-grade small bowel obstruction with pneumatosis of the mesenteric vessels concerning for bowel ischemia. Clinical correlation and surgical consult is advised. 2. Distended stomach with ingested content in the distal esophagus. 3. Multiple nonobstructing left renal calculi. A partially obstructing stone or string of adjacent stones in the left UPJ with associated mild left hydronephrosis. Multiple bladder calculi. 4. Percutaneous cholecystostomy tube with decompression of the Gallbladder.  09-01-18: DG small bowel w single: Diffuse small bowel dilatation is noted.  The contrast administered through nasogastric tube with seen to pool within the stomach and not advance on the initial images. Delayed images demonstrate no contrast present in the large or small bowel. These findings are consistent with either ileus or distal small bowel obstruction.  09-05-18: ct of abdomen an pelvis:  1. Massive dilatation of the small bowel, without definite transition point. Small volume of gas and stool in a relatively decompressed colon. Findings are favored to reflect small bowel ileus, which has been evident on numerous prior abdominal studies. The possibility of partial or early small-bowel obstruction is not entirely excluded, but not strongly favored at this time. 2. Small amount of gas non dependently in the urinary bladder,  favored to be iatrogenic. In the absence of recent catheterization, correlation with urinalysis would be strongly recommended to exclude the possibility of urinary tract infection with gas-forming organisms. 3. Multiple nonobstructive calculi within the left renal collecting system and lying dependently in the lumen of the urinary bladder. No ureteral stones are noted at this time. Despite this, there is moderate right and severe left hydroureteronephrosis, which could be indicative of distal ureteral strictures, and is presumably exacerbated in the setting of a markedly distended urinary bladder. Urologic consultation is recommended. 4. Moderate right and small left pleural effusions lying dependently with some associated passive subsegmental atelectasis throughout the lower lobes of the lungs bilaterally. 5. Patulous fluid-filled esophagus which demonstrates mural thickening and hyper enhancement, which may simply reflect esophagitis, however, there is any clinical concern for Barrett'  metaplasia or esophageal neoplasia, further evaluation with endoscopy could be considered. 6. Additional incidental findings, as above.  TODAY;   09-18-18: ct of  abdomen:  1. Diffusely dilated and air-filled loops of small bowel similar to prior CT and may represent an ileus. A degree of small-bowel obstruction is not entirely excluded. Small-bowel series may provide better evaluation. Clinical correlation and follow-up recommended. 2. No free air or pneumatosis.  No portal venous gas. 3. Second frontal thickening of the distal esophagus may represent esophagitis although infiltrative mass is not excluded. Clinical correlation is recommended. 4. Percutaneous cholecystostomy with contracted gallbladder. 5. Nonobstructing left renal calculi. No hydronephrosis.  09-18-18: small bowel series: Significantly dilated small bowel loops though no evidence of bowel obstruction is identified. Cholelithiasis.  09-26-18: ct of abdomen and pelvis:  1. Substantially limited in areas due to streak artifact from retained barium in the large bowel. 2. Massively dilated air and fluid containing small bowel loops which gradually taper to the colon. The barium contrast administered on 09/18/2018 has reached the rectum, indicating partial if any mechanical obstruction. As on the recent small bowel follow-through radiographs, severe ileus/dysmotility seems more likely. 3. No free air.  No new abnormality identified. 4. Severe bladder wall thickening with Foley catheter in place. Left renal staghorn calculi. Cholecystostomy/subhepatic drain.   LABS REVIEWED PREVIOUS;  08-25-18: wbc 11.4; hgb 15.3; hct 49.4; mcv 93.0; plt 243; glucose 164; bun 30; creat 1.16; k+ 4.2; na++ 136; ca 8.6; liver normal albumin 4.4  08-28-18: wbc 11.7; hgb 13.0; hct 40.9; mcv 91.1; plt 220; glucose 122; bun 34; creat 1.79; k+ 3.7; na++ 144; ca 9.0 phos 3.3 mag 2.0  08-31-18: glucose 121; bun 28; create 1.26; k+ 3.4; na++ 146; ca 8.4 phos 2.2; mag 1.9; pre-albumin 10.1 09-02-18: glucose 114; bun 23; creat 1.07; k+ 3.4; na++ 143; ca 8.1; liver normal albumin 2.4 phos 2.5 mag 1.8 09-05-18: wbc 11.6; hgb  11.9; hct 37.9; mcv 90.7 plt 303; glucose 152; bun 11; creat 1.08; k+ 3.5; na++ 138; ca 9.3 liver normal albumin 4.1  09-08-18: wbc 10.9; hgb 8.7; hct 91.7; plt 225; glucose 165; bun 15; creat 1.11; k+ 3.2; na++ 140; ca 8.1 mag 1.8  09-09-18: wbc 6.1; hgb 8.9; hct 29.0; mcv 92.4 plt 254 glucose 149; bun 7; creat 0.89; k+ 3.3; na++ 138; ca 8.1 mag 1.6   TODAY  09-15-18: wbc 5.7; hgb 9.8; hct 31.6; mcv 92.1 plt 327; glucose 148; bun 14; creat 1.00; k+ 4.5; na++ 136; ca 9.3; mag 1.9 09-18-18: wbc 9.2; hgb 12.0; hct 38.2; mcv 89.7 ;plt 356; glucose 214; bun 21; creat 1.21; k+ 4.9; na++ 132; ca 8.6; liver normal albumin 4.0; mag  2.0 09-19-18: wbc 6.2; hgb 10.2; hct 32.3; mcv 90.5; plt 276; glucose 152; bun 25; creat 1.08; k+ 5.1; na++ 135; ca 9.4; liver normal albumin 3.5; urine culture: e-coli/enterococcus faecalis 09-25-18: wbc 9.5; hgb 12.0; hct 36.9; mcv 88.5; plt 273; glucose 216; bun 22; creat 1.02; k+ 3.2; an++ 133; ca 9.6; liver normal albumin 4.0    Review of Systems  Reason unable to perform ROS: intellectual challenged nonverbal.     Physical Exam Constitutional:      General: He is not in acute distress.    Appearance: He is well-developed. He is not diaphoretic.  Neck:     Musculoskeletal: Neck supple.     Thyroid: No thyromegaly.  Cardiovascular:     Rate and Rhythm: Normal rate and regular rhythm.     Pulses: Normal pulses.     Heart sounds: Normal heart sounds.  Pulmonary:     Effort: Pulmonary effort is normal. No respiratory distress.     Breath sounds: Normal breath sounds.  Abdominal:     General: Bowel sounds are normal. There is distension.     Palpations: Abdomen is soft.     Tenderness: There is no abdominal tenderness.     Comments: Bili drain in place Abdominal binder on  Genitourinary:    Comments: Foley  Musculoskeletal:     Right lower leg: No edema.     Left lower leg: No edema.     Comments: Is able to move all extremities   Lymphadenopathy:     Cervical:  No cervical adenopathy.  Skin:    General: Skin is warm and dry.  Neurological:     Mental Status: He is alert. Mental status is at baseline.  Psychiatric:        Mood and Affect: Mood normal.     ASSESSMENT/ PLAN:  TODAY:   1. Volvulus 2. SBO (small bowel obstruction)    Will continue current plan of care Will continue simethicone Will continue metegrity     MD is aware of resident's narcotic use and is in agreement with current plan of care. We will attempt to wean resident as apropriate   Synthia Innocenteborah Thomes Burak NP [facility]  Contact [phone] Monday through Friday 8am- 5pm  After hours call [phone]

## 2018-09-26 NOTE — ED Notes (Signed)
Pt returned from CT. Warm blanket given. Pt appears comfortable

## 2018-09-26 NOTE — ED Notes (Signed)
This nurse has called [facility] Nurse to report patient's condition.  [name] states they will send a tech over to get patient and bring back to their facility. Pt currently sleeping. This nurse went over discharge instructions with patient. Pt shakes head up and down to confirm understanding of condition and treatment. [name] nurse states he will look over discharge information.  Pt unable to sign for discharge information.

## 2018-09-29 ENCOUNTER — Non-Acute Institutional Stay (SKILLED_NURSING_FACILITY): Payer: Medicare Other | Admitting: Adult Health

## 2018-09-29 ENCOUNTER — Encounter: Payer: Self-pay | Admitting: Adult Health

## 2018-09-29 ENCOUNTER — Encounter (HOSPITAL_COMMUNITY)
Admission: RE | Admit: 2018-09-29 | Discharge: 2018-09-29 | Disposition: A | Discharge status: Home / Self Care | Payer: Medicare Other | Source: Skilled Nursing Facility | Attending: *Deleted | Admitting: *Deleted

## 2018-09-29 DIAGNOSIS — Z48815 Encounter for surgical aftercare following surgery on the digestive system: Secondary | ICD-10-CM | POA: Diagnosis present

## 2018-09-29 DIAGNOSIS — K810 Acute cholecystitis: Secondary | ICD-10-CM | POA: Diagnosis not present

## 2018-09-29 DIAGNOSIS — E876 Hypokalemia: Secondary | ICD-10-CM | POA: Diagnosis not present

## 2018-09-29 DIAGNOSIS — E119 Type 2 diabetes mellitus without complications: Secondary | ICD-10-CM | POA: Diagnosis present

## 2018-09-29 DIAGNOSIS — K56609 Unspecified intestinal obstruction, unspecified as to partial versus complete obstruction: Secondary | ICD-10-CM

## 2018-09-29 DIAGNOSIS — Z978 Presence of other specified devices: Secondary | ICD-10-CM | POA: Insufficient documentation

## 2018-09-29 DIAGNOSIS — K562 Volvulus: Secondary | ICD-10-CM

## 2018-09-29 DIAGNOSIS — Z1159 Encounter for screening for other viral diseases: Secondary | ICD-10-CM | POA: Diagnosis not present

## 2018-09-29 DIAGNOSIS — E43 Unspecified severe protein-calorie malnutrition: Secondary | ICD-10-CM

## 2018-09-29 LAB — BASIC METABOLIC PANEL
Anion gap: 9 (ref 5–15)
BUN: 18 mg/dL (ref 8–23)
CO2: 27 mmol/L (ref 22–32)
Calcium: 9.3 mg/dL (ref 8.9–10.3)
Chloride: 101 mmol/L (ref 98–111)
Creatinine, Ser: 0.97 mg/dL (ref 0.61–1.24)
GFR calc Af Amer: >60 mL/min (ref >60)
GFR calc non Af Amer: >60 mL/min (ref >60)
Glucose, Bld: 121 mg/dL — ABNORMAL HIGH (ref 70–99)
Potassium: 4.1 mmol/L (ref 3.5–5.1)
Sodium: 137 mmol/L (ref 135–145)

## 2018-09-29 LAB — CBC
HCT: 29.9 % — ABNORMAL LOW (ref 39.0–52.0)
Hemoglobin: 9.3 g/dL — ABNORMAL LOW (ref 13.0–17.0)
MCH: 28.6 pg (ref 26.0–34.0)
MCHC: 31.1 g/dL (ref 30.0–36.0)
MCV: 92 fL (ref 80.0–100.0)
Platelets: 200 10*3/uL (ref 150–400)
RBC: 3.25 MIL/uL — ABNORMAL LOW (ref 4.22–5.81)
RDW: 14.2 % (ref 11.5–15.5)
WBC: 4.9 10*3/uL (ref 4.0–10.5)
nRBC: 0 % (ref 0.0–0.2)

## 2018-09-29 NOTE — Progress Notes (Signed)
Location:   [place] Room Number: 127 D Place of Service:  SNF (31)   CODE STATUS: Full Code  No Known Allergies  Chief Complaint  Patient presents with  . Medical Management of Chronic Issues     SBO (small bowel obstruction)/gangrene cholecystitis/volvulus: Hypokalemia/hypomagesemia: Protein calorie malnutrition severe: weekly follow up for the first 30 days post hospitalization.     HPI:  He is a 68 year old short term rehab patient being seen for the management of his chronic illnesses: sbo; cholecystitis; volvulus; malnutrition; hypokalemia; hypomagnesemia. He is presently both mag citrate and mag ox. Will stop the citrate and will need to start him on miralax. There are no reports of pain; no reports of changes in appetite; no reports of anxiety or agitation. There are no reports of fevers present.   Past Medical History:  Diagnosis Date  . Diabetes mellitus without complication ([place])   . History of gout   . History of kidney stones   . Hypertension   . Mentally challenged   . Urinary retention     Past Surgical History:  Procedure Laterality Date  . CYSTOSCOPY W/ URETERAL STENT PLACEMENT Bilateral 06/20/2016   Procedure: CYSTOSCOPY WITH RETROGRADE PYELOGRAM/URETERAL STENT PLACEMENT;  Surgeon: Cleon Gustin, MD;  Location: [facility];  Service: Urology;  Laterality: Bilateral;  . CYSTOSCOPY WITH RETROGRADE PYELOGRAM, URETEROSCOPY AND STENT PLACEMENT Bilateral 08/20/2016   Procedure: CYSTOSCOPY WITH BILATERAL RETROGRADE PYELOGRAM, BILATERAL URETEROSCOPY,  LASER LITHOTRIPSY OF RIGHT URETERAL CALCULI, STONE BASKET EXTRACTION LEFT URETERAL CALCULI  AND BILATERAL URETERAL STENT EXCHANGE;  Surgeon: Cleon Gustin, MD;  Location: [facility];  Service: Urology;  Laterality: Bilateral;  . HOLMIUM LASER APPLICATION Bilateral 12/19/6385   Procedure: HOLMIUM LASER APPLICATION;  Surgeon: Cleon Gustin, MD;  Location: [facility];  Service: Urology;   Laterality: Bilateral;  . IR CATHETER TUBE CHANGE  07/04/2018  . IR EXCHANGE BILIARY DRAIN  06/09/2018  . IR PERC CHOLECYSTOSTOMY  05/24/2018  . LAPAROTOMY N/A 08/25/2018   Procedure: EXPLORATORY LAPAROTOMY reduction of volvulus;  Surgeon: Virl Cagey, MD;  Location: [facility];  Service: General;  Laterality: N/A;    Social History   Socioeconomic History  . Marital status: Single    Spouse name: Not on file  . Number of children: Not on file  . Years of education: Not on file  . Highest education level: Not on file  Occupational History  . Not on file  Social Needs  . Financial resource strain: Not on file  . Food insecurity    Worry: Not on file    Inability: Not on file  . Transportation needs    Medical: Not on file    Non-medical: Not on file  Tobacco Use  . Smoking status: Never Smoker  . Smokeless tobacco: Never Used  Substance and Sexual Activity  . Alcohol use: No  . Drug use: No  . Sexual activity: Never    Birth control/protection: None  Lifestyle  . Physical activity    Days per week: Not on file    Minutes per session: Not on file  . Stress: Not on file  Relationships  . Social Herbalist on phone: Not on file    Gets together: Not on file    Attends religious service: Not on file    Active member of club or organization: Not on file    Attends meetings of clubs or organizations: Not on file  Relationship status: Not on file  . Intimate partner violence    Fear of current or ex partner: Not on file    Emotionally abused: Not on file    Physically abused: Not on file    Forced sexual activity: Not on file  Other Topics Concern  . Not on file  Social History Narrative   Patient is delayed lives with family   Family History  Problem Relation Age of Onset  . Gallbladder disease Other       VITAL SIGNS BP 106/67   Pulse 83   Temp (!) 97.3 F (36.3 C)   Resp 18   Ht 5\' 7"  (1.702 m)   Wt 145 lb (65.8 kg)   BMI 22.71 kg/m    Outpatient Encounter Medications as of 09/29/2018  Medication Sig  . Acetaminophen (TYLENOL ARTHRITIS PAIN PO) Take 1 tablet by mouth every 6 (six) hours as needed (pain).  Marland Kitchen. aspirin EC 81 MG tablet Take 81 mg by mouth every morning.   Lucilla Lame. Balsam [place]-Castor Oil (VENELEX) OINT Apply topically to sacrum and bilateral buttocks every shift and as needed for blanchable erythema and prevention  . finasteride (PROSCAR) 5 MG tablet Take 1 tablet (5 mg total) by mouth daily for 30 days.  . magnesium oxide (MAG-OX) 400 MG tablet Take 1 tablet (400 mg total) by mouth daily for 30 days.  . metFORMIN (GLUCOPHAGE) 500 MG tablet Take 1,000 mg by mouth every morning.  . NON FORMULARY Diet Type:  Soft Diet, NAS, Cons CHO  . Nutritional Supplements (ENSURE ENLIVE PO) Take 1 Bottle by mouth 3 (three) times daily.  . phosphorus (K PHOS NEUTRAL) 155-852-130 MG tablet Take 1 tablet (250 mg total) by mouth daily.  . Prucalopride Succinate 2 MG TABS Take 1 tablet by mouth daily.  . simethicone (MYLICON) 40 MG/0.6ML drops Take 0.6 mLs (40 mg total) by mouth 4 (four) times daily.  . tamsulosin (FLOMAX) 0.4 MG CAPS capsule Take 1 capsule (0.4 mg total) by mouth every evening.   No facility-administered encounter medications on file as of 09/29/2018.      SIGNIFICANT DIAGNOSTIC EXAMS  PREVIOUS;   08-25-18: ct of abdomen and pelvis:  1. High-grade small bowel obstruction with pneumatosis of the mesenteric vessels concerning for bowel ischemia. Clinical correlation and surgical consult is advised. 2. Distended stomach with ingested content in the distal esophagus. 3. Multiple nonobstructing left renal calculi. A partially obstructing stone or string of adjacent stones in the left UPJ with associated mild left hydronephrosis. Multiple bladder calculi. 4. Percutaneous cholecystostomy tube with decompression of the Gallbladder.  09-01-18: DG small bowel w single: Diffuse small bowel dilatation is noted. The contrast  administered through nasogastric tube with seen to pool within the stomach and not advance on the initial images. Delayed images demonstrate no contrast present in the large or small bowel. These findings are consistent with either ileus or distal small bowel obstruction.  09-05-18: ct of abdomen an pelvis:  1. Massive dilatation of the small bowel, without definite transition point. Small volume of gas and stool in a relatively decompressed colon. Findings are favored to reflect small bowel ileus, which has been evident on numerous prior abdominal studies. The possibility of partial or early small-bowel obstruction is not entirely excluded, but not strongly favored at this time. 2. Small amount of gas non dependently in the urinary bladder, favored to be iatrogenic. In the absence of recent catheterization, correlation with urinalysis would be strongly recommended to exclude the  possibility of urinary tract infection with gas-forming organisms. 3. Multiple nonobstructive calculi within the left renal collecting system and lying dependently in the lumen of the urinary bladder. No ureteral stones are noted at this time. Despite this, there is moderate right and severe left hydroureteronephrosis, which could be indicative of distal ureteral strictures, and is presumably exacerbated in the setting of a markedly distended urinary bladder. Urologic consultation is recommended. 4. Moderate right and small left pleural effusions lying dependently with some associated passive subsegmental atelectasis throughout the lower lobes of the lungs bilaterally. 5. Patulous fluid-filled esophagus which demonstrates mural thickening and hyper enhancement, which may simply reflect esophagitis, however, there is any clinical concern for Barrett'  metaplasia or esophageal neoplasia, further evaluation with endoscopy could be considered. 6. Additional incidental findings, as above.  4-20: ct of abdomen:  1. Diffusely  dilated and air-filled loops of small bowel similar to prior CT and may represent an ileus. A degree of small-bowel obstruction is not entirely excluded. Small-bowel series may provide better evaluation. Clinical correlation and follow-up recommended. 2. No free air or pneumatosis.  No portal venous gas. 3. Second frontal thickening of the distal esophagus may represent esophagitis although infiltrative mass is not excluded. Clinical correlation is recommended. 4. Percutaneous cholecystostomy with contracted gallbladder. 5. Nonobstructing left renal calculi. No hydronephrosis.  09-18-18: small bowel series: Significantly dilated small bowel loops though no evidence of bowel obstruction is identified. Cholelithiasis.  09-26-18: ct of abdomen and pelvis:  1. Substantially limited in areas due to streak artifact from retained barium in the large bowel. 2. Massively dilated air and fluid containing small bowel loops which gradually taper to the colon. The barium contrast administered on 09/18/2018 has reached the rectum, indicating partial if any mechanical obstruction. As on the recent small bowel follow-through radiographs, severe ileus/dysmotility seems more likely. 3. No free air.  No new abnormality identified. 4. Severe bladder wall thickening with Foley catheter in place. Left renal staghorn calculi. Cholecystostomy/subhepatic drain.  NO NEW EXAMS.    LABS REVIEWED PREVIOUS;  08-25-18: wbc 11.4; hgb 15.3; hct 49.4; mcv 93.0; plt 243; glucose 164; bun 30; creat 1.16; k+ 4.2; na++ 136; ca 8.6; liver normal albumin 4.4  08-28-18: wbc 11.7; hgb 13.0; hct 40.9; mcv 91.1; plt 220; glucose 122; bun 34; creat 1.79; k+ 3.7; na++ 144; ca 9.0 phos 3.3 mag 2.0  08-31-18: glucose 121; bun 28; create 1.26; k+ 3.4; na++ 146; ca 8.4 phos 2.2; mag 1.9; pre-albumin 10.1 09-02-18: glucose 114; bun 23; creat 1.07; k+ 3.4; na++ 143; ca 8.1; liver normal albumin 2.4 phos 2.5 mag 1.8 09-05-18: wbc 11.6; hgb 11.9;  hct 37.9; mcv 90.7 plt 303; glucose 152; bun 11; creat 1.08; k+ 3.5; na++ 138; ca 9.3 liver normal albumin 4.1  09-08-18: wbc 10.9; hgb 8.7; hct 91.7; plt 225; glucose 165; bun 15; creat 1.11; k+ 3.2; na++ 140; ca 8.1 mag 1.8  09-09-18: wbc 6.1; hgb 8.9; hct 29.0; mcv 92.4 plt 254 glucose 149; bun 7; creat 0.89; k+ 3.3; na++ 138; ca 8.1 mag 1.6  09-15-18: wbc 5.7; hgb 9.8; hct 31.6; mcv 92.1 plt 327; glucose 148; bun 14; creat 1.00; k+ 4.5; na++ 136; ca 9.3; mag 1.9 09-18-18: wbc 9.2; hgb 12.0; hct 38.2; mcv 89.7 ;plt 356; glucose 214; bun 21; creat 1.21; k+ 4.9; na++ 132; ca 8.6; liver normal albumin 4.0; mag 2.0 09-19-18: wbc 6.2; hgb 10.2; hct 32.3; mcv 90.5; plt 276; glucose 152; bun 25; creat 1.08; k+ 5.1; na++  135; ca 9.4; liver normal albumin 3.5; urine culture: e-coli/enterococcus faecalis 09-25-18: wbc 9.5; hgb 12.0; hct 36.9; mcv 88.5; plt 273; glucose 216; bun 22; creat 1.02; k+ 3.2; na++ 133; ca 9.6; liver normal albumin 4.0   NO NEW LABS.    Review of Systems  Reason unable to perform ROS: intellectually challenged     Physical Exam Constitutional:      General: He is not in acute distress.    Appearance: He is well-developed. He is not diaphoretic.  Neck:     Musculoskeletal: Neck supple.     Thyroid: No thyromegaly.  Cardiovascular:     Rate and Rhythm: Normal rate and regular rhythm.     Pulses: Normal pulses.     Heart sounds: Normal heart sounds.  Pulmonary:     Effort: Pulmonary effort is normal. No respiratory distress.     Breath sounds: Normal breath sounds.  Abdominal:     General: Bowel sounds are normal. There is distension.     Palpations: Abdomen is soft.     Tenderness: There is no abdominal tenderness.     Comments: Bili drain in place  Genitourinary:    Comments: foley Musculoskeletal:     Right lower leg: No edema.     Left lower leg: No edema.     Comments: Is able to move all extremities   Lymphadenopathy:     Cervical: No cervical adenopathy.  Skin:     General: Skin is warm and dry.  Neurological:     Mental Status: He is alert. Mental status is at baseline.  Psychiatric:        Mood and Affect: Mood normal.      ASSESSMENT/ PLAN:  TODAY:   1. SBO (small bowel obstruction)/gangrene cholecystitis/volvulus: is status post bili drain and exploratory lap with reduction of volvulus. Will continue mylicon 40 mg four times daily motegrity 2 mg daily will stop mag citrate and will begin miralax daily   2. Hypokalemia/hypomagesemia: is stable will continue mag ox 400 mg daily and k phos neutral: 155/852.130 mg daily   3. Protein calorie malnutrition severe: is stable albumin 4.0 pre-albumin 10.1 will continue supplements as directed.   PREVIOUS  4. Diabetes mellitus type 2 noninsulin dependent: is stable will continue metformin 1 gm daily asa daily  81 mg  5. Benign prostatic hyperplasia with urinary obstruction: is stable will continue proscar 5 mg daily and flomax 0.4 mg daily   6. Acute blood loss anemia: is stable hgb 12.0 will monitor   7. Urinary retention: is stable has long term foley will monitor        MD is aware of resident's narcotic use and is in agreement with current plan of care. We will attempt to wean resident as apropriate   Synthia Innocenteborah Green NP [facility]  Contact [phone] Monday through Friday 8am- 5pm  After hours call [phone]

## 2018-10-01 ENCOUNTER — Non-Acute Institutional Stay (SKILLED_NURSING_FACILITY): Payer: Medicare Other | Admitting: Adult Health

## 2018-10-01 DIAGNOSIS — K56609 Unspecified intestinal obstruction, unspecified as to partial versus complete obstruction: Secondary | ICD-10-CM

## 2018-10-01 DIAGNOSIS — E43 Unspecified severe protein-calorie malnutrition: Secondary | ICD-10-CM | POA: Diagnosis not present

## 2018-10-01 DIAGNOSIS — K562 Volvulus: Secondary | ICD-10-CM

## 2018-10-01 DIAGNOSIS — E119 Type 2 diabetes mellitus without complications: Secondary | ICD-10-CM | POA: Diagnosis not present

## 2018-10-02 ENCOUNTER — Encounter: Payer: Self-pay | Admitting: Adult Health

## 2018-10-02 NOTE — Progress Notes (Signed)
Location:   [facility] Room Number: 127 D Place of Service:  SNF (31)   CODE STATUS: Full Code  No Known Allergies  Chief Complaint  Patient presents with  . Acute Visit    Care Plan Meeting    HPI:  We have come together for his routine care plan meeting. He is presently at his baseline. He will need however to be able to climb 5 steps to get into his home. His family is wanting him to stay until the end of July to ensure that he as functional as possible. There are no reports of uncontrolled pain; no reports of vomiting; excessive distention; no reports of constipation. There are no reports of fevers present.   Past Medical History:  Diagnosis Date  . Diabetes mellitus without complication (HCC)   . History of gout   . History of kidney stones   . Hypertension   . Mentally challenged   . Urinary retention     Past Surgical History:  Procedure Laterality Date  . CYSTOSCOPY W/ URETERAL STENT PLACEMENT Bilateral 06/20/2016   Procedure: CYSTOSCOPY WITH RETROGRADE PYELOGRAM/URETERAL STENT PLACEMENT;  Surgeon: Malen GauzePatrick L McKenzie, MD;  Location: [facility];  Service: Urology;  Laterality: Bilateral;  . CYSTOSCOPY WITH RETROGRADE PYELOGRAM, URETEROSCOPY AND STENT PLACEMENT Bilateral 08/20/2016   Procedure: CYSTOSCOPY WITH BILATERAL RETROGRADE PYELOGRAM, BILATERAL URETEROSCOPY,  LASER LITHOTRIPSY OF RIGHT URETERAL CALCULI, STONE BASKET EXTRACTION LEFT URETERAL CALCULI  AND BILATERAL URETERAL STENT EXCHANGE;  Surgeon: Malen GauzeMcKenzie, Patrick L, MD;  Location: [facility];  Service: Urology;  Laterality: Bilateral;  . HOLMIUM LASER APPLICATION Bilateral 08/20/2016   Procedure: HOLMIUM LASER APPLICATION;  Surgeon: Malen GauzeMcKenzie, Patrick L, MD;  Location: [facility];  Service: Urology;  Laterality: Bilateral;  . IR CATHETER TUBE CHANGE  07/04/2018  . IR EXCHANGE BILIARY DRAIN  06/09/2018  . IR PERC CHOLECYSTOSTOMY  05/24/2018  . LAPAROTOMY N/A 08/25/2018   Procedure: EXPLORATORY LAPAROTOMY  reduction of volvulus;  Surgeon: Lucretia RoersBridges, Lindsay C, MD;  Location: [facility];  Service: General;  Laterality: N/A;    Social History   Socioeconomic History  . Marital status: Single    Spouse name: Not on file  . Number of children: Not on file  . Years of education: Not on file  . Highest education level: Not on file  Occupational History  . Not on file  Social Needs  . Financial resource strain: Not on file  . Food insecurity    Worry: Not on file    Inability: Not on file  . Transportation needs    Medical: Not on file    Non-medical: Not on file  Tobacco Use  . Smoking status: Never Smoker  . Smokeless tobacco: Never Used  Substance and Sexual Activity  . Alcohol use: No  . Drug use: No  . Sexual activity: Never    Birth control/protection: None  Lifestyle  . Physical activity    Days per week: Not on file    Minutes per session: Not on file  . Stress: Not on file  Relationships  . Social Musicianconnections    Talks on phone: Not on file    Gets together: Not on file    Attends religious service: Not on file    Active member of club or organization: Not on file    Attends meetings of clubs or organizations: Not on file    Relationship status: Not on file  . Intimate partner violence    Fear of current  or ex partner: Not on file    Emotionally abused: Not on file    Physically abused: Not on file    Forced sexual activity: Not on file  Other Topics Concern  . Not on file  Social History Narrative   Patient is delayed lives with family   Family History  Problem Relation Age of Onset  . Gallbladder disease Other       VITAL SIGNS BP 132/69   Pulse 60   Temp 98.4 F (36.9 C)   Resp 20   Ht 5\' 7"  (1.702 m)   Wt 145 lb (65.8 kg)   BMI 22.71 kg/m   Outpatient Encounter Medications as of 10/01/2018  Medication Sig  . Acetaminophen (TYLENOL ARTHRITIS PAIN PO) Take 1 tablet by mouth every 6 (six) hours as needed (pain).  Marland Kitchen. aspirin EC 81 MG tablet Take 81 mg  by mouth every morning.   Lucilla Lame. Balsam [place]-Castor Oil (VENELEX) OINT Apply topically to sacrum and bilateral buttocks every shift and as needed for blanchable erythema and prevention  . finasteride (PROSCAR) 5 MG tablet Take 1 tablet (5 mg total) by mouth daily for 30 days.  . magnesium oxide (MAG-OX) 400 MG tablet Take 1 tablet (400 mg total) by mouth daily for 30 days.  . metFORMIN (GLUCOPHAGE) 500 MG tablet Take 1,000 mg by mouth every morning.  . NON FORMULARY Diet Type:  Soft Diet, NAS, Cons CHO  . Nutritional Supplements (ENSURE ENLIVE PO) Take 1 Bottle by mouth 3 (three) times daily.  . phosphorus (K PHOS NEUTRAL) 155-852-130 MG tablet Take 1 tablet (250 mg total) by mouth daily.  . polyethylene glycol (MIRALAX / GLYCOLAX) 17 g packet Take 17 g by mouth daily.  . Prucalopride Succinate 2 MG TABS Take 1 tablet by mouth daily.  . simethicone (MYLICON) 40 MG/0.6ML drops Take 0.6 mLs (40 mg total) by mouth 4 (four) times daily.  . tamsulosin (FLOMAX) 0.4 MG CAPS capsule Take 1 capsule (0.4 mg total) by mouth every evening.   No facility-administered encounter medications on file as of 10/01/2018.      SIGNIFICANT DIAGNOSTIC EXAMS  PREVIOUS;   08-25-18: ct of abdomen and pelvis:  1. High-grade small bowel obstruction with pneumatosis of the mesenteric vessels concerning for bowel ischemia. Clinical correlation and surgical consult is advised. 2. Distended stomach with ingested content in the distal esophagus. 3. Multiple nonobstructing left renal calculi. A partially obstructing stone or string of adjacent stones in the left UPJ with associated mild left hydronephrosis. Multiple bladder calculi. 4. Percutaneous cholecystostomy tube with decompression of the Gallbladder.  09-01-18: DG small bowel w single: Diffuse small bowel dilatation is noted. The contrast administered through nasogastric tube with seen to pool within the stomach and not advance on the initial images. Delayed images  demonstrate no contrast present in the large or small bowel. These findings are consistent with either ileus or distal small bowel obstruction.  09-05-18: ct of abdomen an pelvis:  1. Massive dilatation of the small bowel, without definite transition point. Small volume of gas and stool in a relatively decompressed colon. Findings are favored to reflect small bowel ileus, which has been evident on numerous prior abdominal studies. The possibility of partial or early small-bowel obstruction is not entirely excluded, but not strongly favored at this time. 2. Small amount of gas non dependently in the urinary bladder, favored to be iatrogenic. In the absence of recent catheterization, correlation with urinalysis would be strongly recommended to exclude the possibility  of urinary tract infection with gas-forming organisms. 3. Multiple nonobstructive calculi within the left renal collecting system and lying dependently in the lumen of the urinary bladder. No ureteral stones are noted at this time. Despite this, there is moderate right and severe left hydroureteronephrosis, which could be indicative of distal ureteral strictures, and is presumably exacerbated in the setting of a markedly distended urinary bladder. Urologic consultation is recommended. 4. Moderate right and small left pleural effusions lying dependently with some associated passive subsegmental atelectasis throughout the lower lobes of the lungs bilaterally. 5. Patulous fluid-filled esophagus which demonstrates mural thickening and hyper enhancement, which may simply reflect esophagitis, however, there is any clinical concern for Barrett'  metaplasia or esophageal neoplasia, further evaluation with endoscopy could be considered. 6. Additional incidental findings, as above.  4-20: ct of abdomen:  1. Diffusely dilated and air-filled loops of small bowel similar to prior CT and may represent an ileus. A degree of small-bowel obstruction is not  entirely excluded. Small-bowel series may provide better evaluation. Clinical correlation and follow-up recommended. 2. No free air or pneumatosis.  No portal venous gas. 3. Second frontal thickening of the distal esophagus may represent esophagitis although infiltrative mass is not excluded. Clinical correlation is recommended. 4. Percutaneous cholecystostomy with contracted gallbladder. 5. Nonobstructing left renal calculi. No hydronephrosis.  09-18-18: small bowel series: Significantly dilated small bowel loops though no evidence of bowel obstruction is identified. Cholelithiasis.  09-26-18: ct of abdomen and pelvis:  1. Substantially limited in areas due to streak artifact from retained barium in the large bowel. 2. Massively dilated air and fluid containing small bowel loops which gradually taper to the colon. The barium contrast administered on 09/18/2018 has reached the rectum, indicating partial if any mechanical obstruction. As on the recent small bowel follow-through radiographs, severe ileus/dysmotility seems more likely. 3. No free air.  No new abnormality identified. 4. Severe bladder wall thickening with Foley catheter in place. Left renal staghorn calculi. Cholecystostomy/subhepatic drain.  NO NEW EXAMS.    LABS REVIEWED PREVIOUS;  08-25-18: wbc 11.4; hgb 15.3; hct 49.4; mcv 93.0; plt 243; glucose 164; bun 30; creat 1.16; k+ 4.2; na++ 136; ca 8.6; liver normal albumin 4.4  08-28-18: wbc 11.7; hgb 13.0; hct 40.9; mcv 91.1; plt 220; glucose 122; bun 34; creat 1.79; k+ 3.7; na++ 144; ca 9.0 phos 3.3 mag 2.0  08-31-18: glucose 121; bun 28; create 1.26; k+ 3.4; na++ 146; ca 8.4 phos 2.2; mag 1.9; pre-albumin 10.1 09-02-18: glucose 114; bun 23; creat 1.07; k+ 3.4; na++ 143; ca 8.1; liver normal albumin 2.4 phos 2.5 mag 1.8 09-05-18: wbc 11.6; hgb 11.9; hct 37.9; mcv 90.7 plt 303; glucose 152; bun 11; creat 1.08; k+ 3.5; na++ 138; ca 9.3 liver normal albumin 4.1  09-08-18: wbc 10.9; hgb  8.7; hct 91.7; plt 225; glucose 165; bun 15; creat 1.11; k+ 3.2; na++ 140; ca 8.1 mag 1.8  09-09-18: wbc 6.1; hgb 8.9; hct 29.0; mcv 92.4 plt 254 glucose 149; bun 7; creat 0.89; k+ 3.3; na++ 138; ca 8.1 mag 1.6  09-15-18: wbc 5.7; hgb 9.8; hct 31.6; mcv 92.1 plt 327; glucose 148; bun 14; creat 1.00; k+ 4.5; na++ 136; ca 9.3; mag 1.9 09-18-18: wbc 9.2; hgb 12.0; hct 38.2; mcv 89.7 ;plt 356; glucose 214; bun 21; creat 1.21; k+ 4.9; na++ 132; ca 8.6; liver normal albumin 4.0; mag 2.0 09-19-18: wbc 6.2; hgb 10.2; hct 32.3; mcv 90.5; plt 276; glucose 152; bun 25; creat 1.08; k+ 5.1; na++ 135;  ca 9.4; liver normal albumin 3.5; urine culture: e-coli/enterococcus faecalis 09-25-18: wbc 9.5; hgb 12.0; hct 36.9; mcv 88.5; plt 273; glucose 216; bun 22; creat 1.02; k+ 3.2; na++ 133; ca 9.6; liver normal albumin 4.0   NO NEW LABS.    Review of Systems  Reason unable to perform ROS: intellectulally challenged     Physical Exam Constitutional:      General: He is not in acute distress.    Appearance: He is well-developed. He is not diaphoretic.  Neck:     Musculoskeletal: Neck supple.     Thyroid: No thyromegaly.  Cardiovascular:     Rate and Rhythm: Normal rate and regular rhythm.     Pulses: Normal pulses.     Heart sounds: Normal heart sounds.  Pulmonary:     Effort: Pulmonary effort is normal. No respiratory distress.     Breath sounds: Normal breath sounds.  Abdominal:     General: Bowel sounds are normal. There is distension.     Palpations: Abdomen is soft.     Tenderness: There is no abdominal tenderness.     Comments: Has bili drain  Genitourinary:    Comments: Foley present  Musculoskeletal:     Right lower leg: No edema.     Left lower leg: No edema.     Comments: Is able to move all extremities   Lymphadenopathy:     Cervical: No cervical adenopathy.  Skin:    General: Skin is warm and dry.  Neurological:     Mental Status: He is alert. Mental status is at baseline.  Psychiatric:         Mood and Affect: Mood normal.       ASSESSMENT/ PLAN:  TODAY:   1. SBO (small bowel obstruction)/gangrene cholecystitis/volvulus 2. Protein calorie malnutrition severe 3. Diabetes mellitus type 2 noninsulin dependent   His goal remains to return back home Will continue therapy as directed Will continue current plan of care Will continue to monitor her status.   MD is aware of resident's narcotic use and is in agreement with current plan of care. We will attempt to wean resident as apropriate   Ok Edwards NP [name]  Contact [phone] Monday through Friday 8am- 5pm  After hours call [phone]

## 2018-10-06 ENCOUNTER — Non-Acute Institutional Stay (SKILLED_NURSING_FACILITY): Payer: Medicare Other | Admitting: Adult Health

## 2018-10-06 ENCOUNTER — Encounter: Payer: Self-pay | Admitting: Adult Health

## 2018-10-06 DIAGNOSIS — N138 Other obstructive and reflux uropathy: Secondary | ICD-10-CM

## 2018-10-06 DIAGNOSIS — D62 Acute posthemorrhagic anemia: Secondary | ICD-10-CM | POA: Diagnosis not present

## 2018-10-06 DIAGNOSIS — N401 Enlarged prostate with lower urinary tract symptoms: Secondary | ICD-10-CM

## 2018-10-06 DIAGNOSIS — E119 Type 2 diabetes mellitus without complications: Secondary | ICD-10-CM | POA: Diagnosis not present

## 2018-10-06 NOTE — Progress Notes (Signed)
Location:   [facility] Room Number: 127 D Place of Service:  SNF (31)   CODE STATUS: Full Code  No Known Allergies  Chief Complaint  Patient presents with  . Medical Management of Chronic Issues      Diabetes mellitus type 2 noninsulin dependent; Benign prostatic hyperplasia with urinary obstruction: Acute blood loss anemia: weekly follow up for the first 30 days post hospitalization.     HPI:  He is a 68 year old short term rehab patient being seen for the management of his chronic illnesses: diabetes; bph; anemia. There are no reports of changes in his appetite; no indications of pain present; no reports of anxiety or agitation. There are no reports of fevers present.   Past Medical History:  Diagnosis Date  . Diabetes mellitus without complication (HCC)   . History of gout   . History of kidney stones   . Hypertension   . Mentally challenged   . Urinary retention     Past Surgical History:  Procedure Laterality Date  . CYSTOSCOPY W/ URETERAL STENT PLACEMENT Bilateral 06/20/2016   Procedure: CYSTOSCOPY WITH RETROGRADE PYELOGRAM/URETERAL STENT PLACEMENT;  Surgeon: Malen GauzePatrick L McKenzie, MD;  Location: [facility];  Service: Urology;  Laterality: Bilateral;  . CYSTOSCOPY WITH RETROGRADE PYELOGRAM, URETEROSCOPY AND STENT PLACEMENT Bilateral 08/20/2016   Procedure: CYSTOSCOPY WITH BILATERAL RETROGRADE PYELOGRAM, BILATERAL URETEROSCOPY,  LASER LITHOTRIPSY OF RIGHT URETERAL CALCULI, STONE BASKET EXTRACTION LEFT URETERAL CALCULI  AND BILATERAL URETERAL STENT EXCHANGE;  Surgeon: Malen GauzeMcKenzie, Patrick L, MD;  Location: [facility];  Service: Urology;  Laterality: Bilateral;  . HOLMIUM LASER APPLICATION Bilateral 08/20/2016   Procedure: HOLMIUM LASER APPLICATION;  Surgeon: Malen GauzeMcKenzie, Patrick L, MD;  Location: [facility];  Service: Urology;  Laterality: Bilateral;  . IR CATHETER TUBE CHANGE  07/04/2018  . IR EXCHANGE BILIARY DRAIN  06/09/2018  . IR PERC CHOLECYSTOSTOMY  05/24/2018  .  LAPAROTOMY N/A 08/25/2018   Procedure: EXPLORATORY LAPAROTOMY reduction of volvulus;  Surgeon: Lucretia RoersBridges, Lindsay C, MD;  Location: [facility];  Service: General;  Laterality: N/A;    Social History   Socioeconomic History  . Marital status: Single    Spouse name: Not on file  . Number of children: Not on file  . Years of education: Not on file  . Highest education level: Not on file  Occupational History  . Not on file  Social Needs  . Financial resource strain: Not on file  . Food insecurity    Worry: Not on file    Inability: Not on file  . Transportation needs    Medical: Not on file    Non-medical: Not on file  Tobacco Use  . Smoking status: Never Smoker  . Smokeless tobacco: Never Used  Substance and Sexual Activity  . Alcohol use: No  . Drug use: No  . Sexual activity: Never    Birth control/protection: None  Lifestyle  . Physical activity    Days per week: Not on file    Minutes per session: Not on file  . Stress: Not on file  Relationships  . Social Musicianconnections    Talks on phone: Not on file    Gets together: Not on file    Attends religious service: Not on file    Active member of club or organization: Not on file    Attends meetings of clubs or organizations: Not on file    Relationship status: Not on file  . Intimate partner violence    Fear  of current or ex partner: Not on file    Emotionally abused: Not on file    Physically abused: Not on file    Forced sexual activity: Not on file  Other Topics Concern  . Not on file  Social History Narrative   Patient is delayed lives with family   Family History  Problem Relation Age of Onset  . Gallbladder disease Other       VITAL SIGNS BP 97/65   Pulse (!) 52   Temp 98.6 F (37 C)   Resp 19   Ht 5\' 7"  (1.702 m)   Wt 145 lb (65.8 kg)   BMI 22.71 kg/m   Outpatient Encounter Medications as of 10/06/2018  Medication Sig  . Acetaminophen (TYLENOL ARTHRITIS PAIN PO) Take 1 tablet by mouth every 6 (six)  hours as needed (pain).  Marland Kitchen. aspirin EC 81 MG tablet Take 81 mg by mouth every morning.   Lucilla Lame. Balsam [place]-Castor Oil (VENELEX) OINT Apply topically to sacrum and bilateral buttocks every shift and as needed for blanchable erythema and prevention  . finasteride (PROSCAR) 5 MG tablet Take 1 tablet (5 mg total) by mouth daily for 30 days.  . magnesium oxide (MAG-OX) 400 MG tablet Take 1 tablet (400 mg total) by mouth daily for 30 days.  . metFORMIN (GLUCOPHAGE) 500 MG tablet Take 1,000 mg by mouth every morning.  . NON FORMULARY Diet Type:  Soft Diet, NAS, Cons CHO  . Nutritional Supplements (ENSURE ENLIVE PO) Take 1 Bottle by mouth 3 (three) times daily.  . phosphorus (K PHOS NEUTRAL) 155-852-130 MG tablet Take 1 tablet (250 mg total) by mouth daily.  . polyethylene glycol (MIRALAX / GLYCOLAX) 17 g packet Take 17 g by mouth daily.  . Prucalopride Succinate 2 MG TABS Take 1 tablet by mouth daily.  . simethicone (MYLICON) 40 MG/0.6ML drops Take 0.6 mLs (40 mg total) by mouth 4 (four) times daily.  . tamsulosin (FLOMAX) 0.4 MG CAPS capsule Take 1 capsule (0.4 mg total) by mouth every evening.   No facility-administered encounter medications on file as of 10/06/2018.      SIGNIFICANT DIAGNOSTIC EXAMS  PREVIOUS;   08-25-18: ct of abdomen and pelvis:  1. High-grade small bowel obstruction with pneumatosis of the mesenteric vessels concerning for bowel ischemia. Clinical correlation and surgical consult is advised. 2. Distended stomach with ingested content in the distal esophagus. 3. Multiple nonobstructing left renal calculi. A partially obstructing stone or string of adjacent stones in the left UPJ with associated mild left hydronephrosis. Multiple bladder calculi. 4. Percutaneous cholecystostomy tube with decompression of the Gallbladder.  09-01-18: DG small bowel w single: Diffuse small bowel dilatation is noted. The contrast administered through nasogastric tube with seen to pool within the  stomach and not advance on the initial images. Delayed images demonstrate no contrast present in the large or small bowel. These findings are consistent with either ileus or distal small bowel obstruction.  09-05-18: ct of abdomen an pelvis:  1. Massive dilatation of the small bowel, without definite transition point. Small volume of gas and stool in a relatively decompressed colon. Findings are favored to reflect small bowel ileus, which has been evident on numerous prior abdominal studies. The possibility of partial or early small-bowel obstruction is not entirely excluded, but not strongly favored at this time. 2. Small amount of gas non dependently in the urinary bladder, favored to be iatrogenic. In the absence of recent catheterization, correlation with urinalysis would be strongly recommended to  exclude the possibility of urinary tract infection with gas-forming organisms. 3. Multiple nonobstructive calculi within the left renal collecting system and lying dependently in the lumen of the urinary bladder. No ureteral stones are noted at this time. Despite this, there is moderate right and severe left hydroureteronephrosis, which could be indicative of distal ureteral strictures, and is presumably exacerbated in the setting of a markedly distended urinary bladder. Urologic consultation is recommended. 4. Moderate right and small left pleural effusions lying dependently with some associated passive subsegmental atelectasis throughout the lower lobes of the lungs bilaterally. 5. Patulous fluid-filled esophagus which demonstrates mural thickening and hyper enhancement, which may simply reflect esophagitis, however, there is any clinical concern for Barrett'  metaplasia or esophageal neoplasia, further evaluation with endoscopy could be considered. 6. Additional incidental findings, as above.  4-20: ct of abdomen:  1. Diffusely dilated and air-filled loops of small bowel similar to prior CT and may  represent an ileus. A degree of small-bowel obstruction is not entirely excluded. Small-bowel series may provide better evaluation. Clinical correlation and follow-up recommended. 2. No free air or pneumatosis.  No portal venous gas. 3. Second frontal thickening of the distal esophagus may represent esophagitis although infiltrative mass is not excluded. Clinical correlation is recommended. 4. Percutaneous cholecystostomy with contracted gallbladder. 5. Nonobstructing left renal calculi. No hydronephrosis.  09-18-18: small bowel series: Significantly dilated small bowel loops though no evidence of bowel obstruction is identified. Cholelithiasis.  09-26-18: ct of abdomen and pelvis:  1. Substantially limited in areas due to streak artifact from retained barium in the large bowel. 2. Massively dilated air and fluid containing small bowel loops which gradually taper to the colon. The barium contrast administered on 09/18/2018 has reached the rectum, indicating partial if any mechanical obstruction. As on the recent small bowel follow-through radiographs, severe ileus/dysmotility seems more likely. 3. No free air.  No new abnormality identified. 4. Severe bladder wall thickening with Foley catheter in place. Left renal staghorn calculi. Cholecystostomy/subhepatic drain.  NO NEW EXAMS.    LABS REVIEWED PREVIOUS;  08-25-18: wbc 11.4; hgb 15.3; hct 49.4; mcv 93.0; plt 243; glucose 164; bun 30; creat 1.16; k+ 4.2; na++ 136; ca 8.6; liver normal albumin 4.4  08-28-18: wbc 11.7; hgb 13.0; hct 40.9; mcv 91.1; plt 220; glucose 122; bun 34; creat 1.79; k+ 3.7; na++ 144; ca 9.0 phos 3.3 mag 2.0  08-31-18: glucose 121; bun 28; create 1.26; k+ 3.4; na++ 146; ca 8.4 phos 2.2; mag 1.9; pre-albumin 10.1 09-02-18: glucose 114; bun 23; creat 1.07; k+ 3.4; na++ 143; ca 8.1; liver normal albumin 2.4 phos 2.5 mag 1.8 09-05-18: wbc 11.6; hgb 11.9; hct 37.9; mcv 90.7 plt 303; glucose 152; bun 11; creat 1.08; k+ 3.5; na++  138; ca 9.3 liver normal albumin 4.1  09-08-18: wbc 10.9; hgb 8.7; hct 91.7; plt 225; glucose 165; bun 15; creat 1.11; k+ 3.2; na++ 140; ca 8.1 mag 1.8  09-09-18: wbc 6.1; hgb 8.9; hct 29.0; mcv 92.4 plt 254 glucose 149; bun 7; creat 0.89; k+ 3.3; na++ 138; ca 8.1 mag 1.6  09-15-18: wbc 5.7; hgb 9.8; hct 31.6; mcv 92.1 plt 327; glucose 148; bun 14; creat 1.00; k+ 4.5; na++ 136; ca 9.3; mag 1.9 09-18-18: wbc 9.2; hgb 12.0; hct 38.2; mcv 89.7 ;plt 356; glucose 214; bun 21; creat 1.21; k+ 4.9; na++ 132; ca 8.6; liver normal albumin 4.0; mag 2.0 09-19-18: wbc 6.2; hgb 10.2; hct 32.3; mcv 90.5; plt 276; glucose 152; bun 25; creat 1.08; k+  5.1; na++ 135; ca 9.4; liver normal albumin 3.5; urine culture: e-coli/enterococcus faecalis 09-25-18: wbc 9.5; hgb 12.0; hct 36.9; mcv 88.5; plt 273; glucose 216; bun 22; creat 1.02; k+ 3.2; na++ 133; ca 9.6; liver normal albumin 4.0   NO NEW LABS.     Review of Systems  Reason unable to perform ROS: intellectually challenged; nonverbal       Physical Exam Constitutional:      General: He is not in acute distress.    Appearance: He is well-developed. He is not diaphoretic.  Neck:     Musculoskeletal: Neck supple.     Thyroid: No thyromegaly.  Cardiovascular:     Rate and Rhythm: Normal rate and regular rhythm.     Pulses: Normal pulses.     Heart sounds: Normal heart sounds.  Pulmonary:     Effort: Pulmonary effort is normal. No respiratory distress.     Breath sounds: Normal breath sounds.  Abdominal:     General: Bowel sounds are normal. There is no distension.     Palpations: Abdomen is soft.     Tenderness: There is no abdominal tenderness.     Comments: Has bili drain  Genitourinary:    Comments: Has foley  Musculoskeletal:     Right lower leg: No edema.     Left lower leg: No edema.     Comments: Is able to move all extremities   Lymphadenopathy:     Cervical: No cervical adenopathy.  Skin:    General: Skin is warm and dry.  Neurological:      Mental Status: He is alert. Mental status is at baseline.  Psychiatric:        Mood and Affect: Mood normal.     ASSESSMENT/ PLAN:   TODAY:   1. Diabetes mellitus type 2 noninsulin dependent; is table will continue metformin 1 gm daily and asa 81 mg daily  2. Benign prostatic hyperplasia with urinary obstruction: is stable has foley; will continue proscar 5 mg daily and flomax 0.4 mg daily   3. Acute blood loss anemia: is stable hgb 12.0 will monitor   PREVIOUS  4. SBO (small bowel obstruction)/gangrene cholecystitis/volvulus: is status post bili drain and exploratory lap with reduction of volvulus. Will continue mylicon 40 mg four times daily motegrity 2 mg daily will stop mag citrate and will begin miralax daily   5. Hypokalemia/hypomagesemia: is stable will continue mag ox 400 mg daily and k phos neutral: 155/852.130 mg daily   6. Protein calorie malnutrition severe: is stable albumin 4.0 pre-albumin 10.1 will continue supplements as directed.   MD is aware of resident's narcotic use and is in agreement with current plan of care. We will attempt to wean resident as apropriate   Synthia Innocenteborah Green NP [facility]  Contact [phone] Monday through Friday 8am- 5pm  After hours call [phone]

## 2018-10-08 ENCOUNTER — Encounter: Payer: Self-pay | Admitting: Adult Health

## 2018-10-08 ENCOUNTER — Non-Acute Institutional Stay (SKILLED_NURSING_FACILITY): Payer: Medicare Other | Admitting: Adult Health

## 2018-10-08 DIAGNOSIS — F79 Unspecified intellectual disabilities: Secondary | ICD-10-CM

## 2018-10-08 DIAGNOSIS — K810 Acute cholecystitis: Secondary | ICD-10-CM

## 2018-10-08 DIAGNOSIS — E119 Type 2 diabetes mellitus without complications: Secondary | ICD-10-CM

## 2018-10-08 NOTE — Progress Notes (Signed)
[facility]:   [facility] Room Number: 127 D Place of Service:  SNF (31)   CODE STATUS: Full Code  No Known Allergies  Chief Complaint  Patient presents with  . Acute Visit    Care Plan Meeting    HPI:  We have come together for his routine care plan meeting. There is no family present. BIM 0 depression 0. He is doing well with therapy; is practicing steps; he does continue to hunch over with ambulation due to his abdominal discomfort. His family will want his foley removed upon discharge; they prefer to I/O cath him. There are no reports of uncontrolled pain; no changes in his appetite; no reports of agitation present.    Past Medical History:  Diagnosis Date  . Diabetes mellitus without complication (HCC)   . History of gout   . History of kidney stones   . Hypertension   . Mentally challenged   . Urinary retention     Past Surgical History:  Procedure Laterality Date  . CYSTOSCOPY W/ URETERAL STENT PLACEMENT Bilateral 06/20/2016   Procedure: CYSTOSCOPY WITH RETROGRADE PYELOGRAM/URETERAL STENT PLACEMENT;  Surgeon: Malen GauzePatrick L McKenzie, MD;  Location: [facility];  Service: Urology;  Laterality: Bilateral;  . CYSTOSCOPY WITH RETROGRADE PYELOGRAM, URETEROSCOPY AND STENT PLACEMENT Bilateral 08/20/2016   Procedure: CYSTOSCOPY WITH BILATERAL RETROGRADE PYELOGRAM, BILATERAL URETEROSCOPY,  LASER LITHOTRIPSY OF RIGHT URETERAL CALCULI, STONE BASKET EXTRACTION LEFT URETERAL CALCULI  AND BILATERAL URETERAL STENT EXCHANGE;  Surgeon: Malen GauzeMcKenzie, Patrick L, MD;  Location: [facility];  Service: Urology;  Laterality: Bilateral;  . HOLMIUM LASER APPLICATION Bilateral 08/20/2016   Procedure: HOLMIUM LASER APPLICATION;  Surgeon: Malen GauzeMcKenzie, Patrick L, MD;  Location: [facility];  Service: Urology;  Laterality: Bilateral;  . IR CATHETER TUBE CHANGE  07/04/2018  . IR EXCHANGE BILIARY DRAIN  06/09/2018  . IR PERC CHOLECYSTOSTOMY  05/24/2018  . LAPAROTOMY N/A 08/25/2018   Procedure: EXPLORATORY  LAPAROTOMY reduction of volvulus;  Surgeon: Lucretia RoersBridges, Lindsay C, MD;  Location: [facility];  Service: General;  Laterality: N/A;    Social History   Socioeconomic History  . Marital status: Single    Spouse name: Not on file  . Number of children: Not on file  . Years of education: Not on file  . Highest education level: Not on file  Occupational History  . Not on file  Social Needs  . Financial resource strain: Not on file  . Food insecurity    Worry: Not on file    Inability: Not on file  . Transportation needs    Medical: Not on file    Non-medical: Not on file  Tobacco Use  . Smoking status: Never Smoker  . Smokeless tobacco: Never Used  Substance and Sexual Activity  . Alcohol use: No  . Drug use: No  . Sexual activity: Never    Birth control/protection: None  Lifestyle  . Physical activity    Days per week: Not on file    Minutes per session: Not on file  . Stress: Not on file  Relationships  . Social Musicianconnections    Talks on phone: Not on file    Gets together: Not on file    Attends religious service: Not on file    Active member of club or organization: Not on file    Attends meetings of clubs or organizations: Not on file    Relationship status: Not on file  . Intimate partner violence    Fear of current or ex  partner: Not on file    Emotionally abused: Not on file    Physically abused: Not on file    Forced sexual activity: Not on file  Other Topics Concern  . Not on file  Social History Narrative   Patient is delayed lives with family   Family History  Problem Relation Age of Onset  . Gallbladder disease Other       VITAL SIGNS BP 118/62   Pulse 72   Temp 98.5 F (36.9 C)   Resp 17   Ht 5\' 7"  (1.702 m)   Wt 145 lb (65.8 kg)   BMI 22.71 kg/m   Outpatient Encounter Medications as of 10/08/2018  Medication Sig  . Acetaminophen (TYLENOL ARTHRITIS PAIN PO) Take 1 tablet by mouth every 6 (six) hours as needed (pain).  Marland Kitchen aspirin EC 81 MG tablet  Take 81 mg by mouth every morning.   Roseanne Kaufman [place]-Castor Oil (VENELEX) OINT Apply topically to sacrum and bilateral buttocks every shift and as needed for blanchable erythema and prevention  . finasteride (PROSCAR) 5 MG tablet Take 1 tablet (5 mg total) by mouth daily for 30 days.  . magnesium oxide (MAG-OX) 400 MG tablet Take 1 tablet (400 mg total) by mouth daily for 30 days.  . metFORMIN (GLUCOPHAGE) 500 MG tablet Take 1,000 mg by mouth every morning.  . NON FORMULARY Diet Type:  Soft Diet, NAS, Cons CHO  . Nutritional Supplements (ENSURE ENLIVE PO) Take 1 Bottle by mouth 3 (three) times daily.  . phosphorus (K PHOS NEUTRAL) 155-852-130 MG tablet Take 1 tablet (250 mg total) by mouth daily.  . polyethylene glycol (MIRALAX / GLYCOLAX) 17 g packet Take 17 g by mouth daily.  . Prucalopride Succinate 2 MG TABS Take 1 tablet by mouth daily.  . simethicone (MYLICON) 40 IO/9.7DZ drops Take 0.6 mLs (40 mg total) by mouth 4 (four) times daily.  . tamsulosin (FLOMAX) 0.4 MG CAPS capsule Take 1 capsule (0.4 mg total) by mouth every evening.   No facility-administered encounter medications on file as of 10/08/2018.      SIGNIFICANT DIAGNOSTIC EXAMS   PREVIOUS;   08-25-18: ct of abdomen and pelvis:  1. High-grade small bowel obstruction with pneumatosis of the mesenteric vessels concerning for bowel ischemia. Clinical correlation and surgical consult is advised. 2. Distended stomach with ingested content in the distal esophagus. 3. Multiple nonobstructing left renal calculi. A partially obstructing stone or string of adjacent stones in the left UPJ with associated mild left hydronephrosis. Multiple bladder calculi. 4. Percutaneous cholecystostomy tube with decompression of the Gallbladder.  09-01-18: DG small bowel w single: Diffuse small bowel dilatation is noted. The contrast administered through nasogastric tube with seen to pool within the stomach and not advance on the initial images. Delayed  images demonstrate no contrast present in the large or small bowel. These findings are consistent with either ileus or distal small bowel obstruction.  09-05-18: ct of abdomen an pelvis:  1. Massive dilatation of the small bowel, without definite transition point. Small volume of gas and stool in a relatively decompressed colon. Findings are favored to reflect small bowel ileus, which has been evident on numerous prior abdominal studies. The possibility of partial or early small-bowel obstruction is not entirely excluded, but not strongly favored at this time. 2. Small amount of gas non dependently in the urinary bladder, favored to be iatrogenic. In the absence of recent catheterization, correlation with urinalysis would be strongly recommended to exclude the possibility of  urinary tract infection with gas-forming organisms. 3. Multiple nonobstructive calculi within the left renal collecting system and lying dependently in the lumen of the urinary bladder. No ureteral stones are noted at this time. Despite this, there is moderate right and severe left hydroureteronephrosis, which could be indicative of distal ureteral strictures, and is presumably exacerbated in the setting of a markedly distended urinary bladder. Urologic consultation is recommended. 4. Moderate right and small left pleural effusions lying dependently with some associated passive subsegmental atelectasis throughout the lower lobes of the lungs bilaterally. 5. Patulous fluid-filled esophagus which demonstrates mural thickening and hyper enhancement, which may simply reflect esophagitis, however, there is any clinical concern for Barrett'  metaplasia or esophageal neoplasia, further evaluation with endoscopy could be considered. 6. Additional incidental findings, as above.  4-20: ct of abdomen:  1. Diffusely dilated and air-filled loops of small bowel similar to prior CT and may represent an ileus. A degree of small-bowel obstruction  is not entirely excluded. Small-bowel series may provide better evaluation. Clinical correlation and follow-up recommended. 2. No free air or pneumatosis.  No portal venous gas. 3. Second frontal thickening of the distal esophagus may represent esophagitis although infiltrative mass is not excluded. Clinical correlation is recommended. 4. Percutaneous cholecystostomy with contracted gallbladder. 5. Nonobstructing left renal calculi. No hydronephrosis.  09-18-18: small bowel series: Significantly dilated small bowel loops though no evidence of bowel obstruction is identified. Cholelithiasis.  09-26-18: ct of abdomen and pelvis:  1. Substantially limited in areas due to streak artifact from retained barium in the large bowel. 2. Massively dilated air and fluid containing small bowel loops which gradually taper to the colon. The barium contrast administered on 09/18/2018 has reached the rectum, indicating partial if any mechanical obstruction. As on the recent small bowel follow-through radiographs, severe ileus/dysmotility seems more likely. 3. No free air.  No new abnormality identified. 4. Severe bladder wall thickening with Foley catheter in place. Left renal staghorn calculi. Cholecystostomy/subhepatic drain.  NO NEW EXAMS.    LABS REVIEWED PREVIOUS;  08-25-18: wbc 11.4; hgb 15.3; hct 49.4; mcv 93.0; plt 243; glucose 164; bun 30; creat 1.16; k+ 4.2; na++ 136; ca 8.6; liver normal albumin 4.4  08-28-18: wbc 11.7; hgb 13.0; hct 40.9; mcv 91.1; plt 220; glucose 122; bun 34; creat 1.79; k+ 3.7; na++ 144; ca 9.0 phos 3.3 mag 2.0  08-31-18: glucose 121; bun 28; create 1.26; k+ 3.4; na++ 146; ca 8.4 phos 2.2; mag 1.9; pre-albumin 10.1 09-02-18: glucose 114; bun 23; creat 1.07; k+ 3.4; na++ 143; ca 8.1; liver normal albumin 2.4 phos 2.5 mag 1.8 09-05-18: wbc 11.6; hgb 11.9; hct 37.9; mcv 90.7 plt 303; glucose 152; bun 11; creat 1.08; k+ 3.5; na++ 138; ca 9.3 liver normal albumin 4.1  09-08-18: wbc  10.9; hgb 8.7; hct 91.7; plt 225; glucose 165; bun 15; creat 1.11; k+ 3.2; na++ 140; ca 8.1 mag 1.8  09-09-18: wbc 6.1; hgb 8.9; hct 29.0; mcv 92.4 plt 254 glucose 149; bun 7; creat 0.89; k+ 3.3; na++ 138; ca 8.1 mag 1.6  09-15-18: wbc 5.7; hgb 9.8; hct 31.6; mcv 92.1 plt 327; glucose 148; bun 14; creat 1.00; k+ 4.5; na++ 136; ca 9.3; mag 1.9 09-18-18: wbc 9.2; hgb 12.0; hct 38.2; mcv 89.7 ;plt 356; glucose 214; bun 21; creat 1.21; k+ 4.9; na++ 132; ca 8.6; liver normal albumin 4.0; mag 2.0 09-19-18: wbc 6.2; hgb 10.2; hct 32.3; mcv 90.5; plt 276; glucose 152; bun 25; creat 1.08; k+ 5.1; na++ 135; ca  9.4; liver normal albumin 3.5; urine culture: e-coli/enterococcus faecalis 09-25-18: wbc 9.5; hgb 12.0; hct 36.9; mcv 88.5; plt 273; glucose 216; bun 22; creat 1.02; k+ 3.2; na++ 133; ca 9.6; liver normal albumin 4.0   NO NEW LABS.   Review of Systems  Reason unable to perform ROS: intellectually challenged nonverbal      Physical Exam Constitutional:      General: He is not in acute distress.    Appearance: He is well-developed. He is not diaphoretic.  Neck:     Musculoskeletal: Neck supple.     Thyroid: No thyromegaly.  Cardiovascular:     Rate and Rhythm: Normal rate and regular rhythm.     Pulses: Normal pulses.     Heart sounds: Normal heart sounds.  Pulmonary:     Effort: Pulmonary effort is normal. No respiratory distress.     Breath sounds: Normal breath sounds.  Abdominal:     General: Bowel sounds are normal. There is no distension.     Palpations: Abdomen is soft.     Tenderness: There is no abdominal tenderness.     Comments: Has bili drain  Genitourinary:    Comments: Foley  Musculoskeletal: Normal range of motion.     Right lower leg: No edema.     Left lower leg: No edema.  Lymphadenopathy:     Cervical: No cervical adenopathy.  Skin:    General: Skin is warm and dry.  Neurological:     Mental Status: He is alert. Mental status is at baseline.  Psychiatric:        Mood  and Affect: Mood normal.      ASSESSMENT/ PLAN:  TODAY:   1. Gangrenous cholecystitis  2. Intellectual disability  3. Diabetes type 2 noninsulin dependent   Will continue therapy as directed Will continue current medications Will remove foley upon discharge His goal remains to return back home.   MD is aware of resident's narcotic use and is in agreement with current plan of care. We will attempt to wean resident as apropriate   Synthia Innocenteborah Miracle Criado NP [facility]  Contact [phone] Monday through Friday 8am- 5pm  After hours call [phone]

## 2018-10-13 ENCOUNTER — Non-Acute Institutional Stay (SKILLED_NURSING_FACILITY): Payer: Medicare Other | Admitting: Adult Health

## 2018-10-13 ENCOUNTER — Encounter: Payer: Self-pay | Admitting: Adult Health

## 2018-10-13 DIAGNOSIS — K562 Volvulus: Secondary | ICD-10-CM

## 2018-10-13 DIAGNOSIS — K56609 Unspecified intestinal obstruction, unspecified as to partial versus complete obstruction: Secondary | ICD-10-CM

## 2018-10-13 DIAGNOSIS — E43 Unspecified severe protein-calorie malnutrition: Secondary | ICD-10-CM | POA: Diagnosis not present

## 2018-10-13 DIAGNOSIS — E876 Hypokalemia: Secondary | ICD-10-CM

## 2018-10-13 NOTE — Progress Notes (Signed)
Location:    [facility] Room Number: 127/D Place of Service:  SNF (31)   CODE STATUS: Full Code  No Known Allergies  Chief Complaint  Patient presents with  . Medical Management of Chronic Issues       SBO (small bowel obstruction) /gangrene cholecystitis/volvulus: Hypokalemia/magnesemia:. Protein calorie malnutrition severe:  Weekly follow up for the first 30 days post hospitalization     HPI:  He is a 68 year old short term rehab patient being seen for the management of her chronic illnesses: SBO; volvulus; hypokalemia; malnutrition. He has a good appetite; no reports of constipation or abdominal bloating. There are no reports of uncontrolled pain.   Past Medical History:  Diagnosis Date  . Diabetes mellitus without complication (HCC)   . History of gout   . History of kidney stones   . Hypertension   . Mentally challenged   . Urinary retention     Past Surgical History:  Procedure Laterality Date  . CYSTOSCOPY W/ URETERAL STENT PLACEMENT Bilateral 06/20/2016   Procedure: CYSTOSCOPY WITH RETROGRADE PYELOGRAM/URETERAL STENT PLACEMENT;  Surgeon: Malen GauzePatrick L McKenzie, MD;  Location: [facility];  Service: Urology;  Laterality: Bilateral;  . CYSTOSCOPY WITH RETROGRADE PYELOGRAM, URETEROSCOPY AND STENT PLACEMENT Bilateral 08/20/2016   Procedure: CYSTOSCOPY WITH BILATERAL RETROGRADE PYELOGRAM, BILATERAL URETEROSCOPY,  LASER LITHOTRIPSY OF RIGHT URETERAL CALCULI, STONE BASKET EXTRACTION LEFT URETERAL CALCULI  AND BILATERAL URETERAL STENT EXCHANGE;  Surgeon: Malen GauzeMcKenzie, Patrick L, MD;  Location: [facility];  Service: Urology;  Laterality: Bilateral;  . HOLMIUM LASER APPLICATION Bilateral 08/20/2016   Procedure: HOLMIUM LASER APPLICATION;  Surgeon: Malen GauzeMcKenzie, Patrick L, MD;  Location: [facility];  Service: Urology;  Laterality: Bilateral;  . IR CATHETER TUBE CHANGE  07/04/2018  . IR EXCHANGE BILIARY DRAIN  06/09/2018  . IR PERC CHOLECYSTOSTOMY  05/24/2018  . LAPAROTOMY N/A 08/25/2018   Procedure: EXPLORATORY LAPAROTOMY reduction of volvulus;  Surgeon: Lucretia RoersBridges, Lindsay C, MD;  Location: [facility];  Service: General;  Laterality: N/A;    Social History   Socioeconomic History  . Marital status: Single    Spouse name: Not on file  . Number of children: Not on file  . Years of education: Not on file  . Highest education level: Not on file  Occupational History  . Not on file  Social Needs  . Financial resource strain: Not on file  . Food insecurity    Worry: Not on file    Inability: Not on file  . Transportation needs    Medical: Not on file    Non-medical: Not on file  Tobacco Use  . Smoking status: Never Smoker  . Smokeless tobacco: Never Used  Substance and Sexual Activity  . Alcohol use: No  . Drug use: No  . Sexual activity: Never    Birth control/protection: None  Lifestyle  . Physical activity    Days per week: Not on file    Minutes per session: Not on file  . Stress: Not on file  Relationships  . Social Musicianconnections    Talks on phone: Not on file    Gets together: Not on file    Attends religious service: Not on file    Active member of club or organization: Not on file    Attends meetings of clubs or organizations: Not on file    Relationship status: Not on file  . Intimate partner violence    Fear of current or ex partner: Not on file    Emotionally  abused: Not on file    Physically abused: Not on file    Forced sexual activity: Not on file  Other Topics Concern  . Not on file  Social History Narrative   Patient is delayed lives with family   Family History  Problem Relation Age of Onset  . Gallbladder disease Other       VITAL SIGNS BP (!) 105/52   Pulse 63   Temp (!) 97.1 F (36.2 C) (Oral)   Resp 20   Wt 145 lb 9.6 oz (66 kg)   SpO2 98%   BMI 22.80 kg/m   Outpatient Encounter Medications as of 10/13/2018  Medication Sig  . Acetaminophen (TYLENOL ARTHRITIS PAIN PO) Take 1 tablet by mouth every 6 (six) hours as needed  (pain).  Marland Kitchen. aspirin EC 81 MG tablet Take 81 mg by mouth every morning.   Lucilla Lame. Balsam [place]-Castor Oil (VENELEX) OINT Apply topically to sacrum and bilateral buttocks every shift and as needed for blanchable erythema and prevention  . finasteride (PROSCAR) 5 MG tablet Take 5 mg by mouth daily.  . magnesium oxide (MAG-OX) 400 MG tablet Take 400 mg by mouth daily.  . metFORMIN (GLUCOPHAGE) 500 MG tablet Take 1,000 mg by mouth every morning.  . NON FORMULARY Diet Type:  Soft Diet, NAS, Cons CHO  . Nutritional Supplements (ENSURE ENLIVE PO) Take 1 Bottle by mouth 3 (three) times daily.  . phosphorus (K PHOS NEUTRAL) 155-852-130 MG tablet Take 1 tablet (250 mg total) by mouth daily.  . polyethylene glycol (MIRALAX / GLYCOLAX) 17 g packet Take 17 g by mouth daily.  . Prucalopride Succinate 2 MG TABS Take 1 tablet by mouth daily.  . simethicone (MYLICON) 40 MG/0.6ML drops Take 0.6 mLs (40 mg total) by mouth 4 (four) times daily.  . sodium chloride 0.9 % injection Inject 10 mLs into the vein every other day.  . tamsulosin (FLOMAX) 0.4 MG CAPS capsule Take 1 capsule (0.4 mg total) by mouth every evening.   No facility-administered encounter medications on file as of 10/13/2018.      SIGNIFICANT DIAGNOSTIC EXAMS   PREVIOUS;   08-25-18: ct of abdomen and pelvis:  1. High-grade small bowel obstruction with pneumatosis of the mesenteric vessels concerning for bowel ischemia. Clinical correlation and surgical consult is advised. 2. Distended stomach with ingested content in the distal esophagus. 3. Multiple nonobstructing left renal calculi. A partially obstructing stone or string of adjacent stones in the left UPJ with associated mild left hydronephrosis. Multiple bladder calculi. 4. Percutaneous cholecystostomy tube with decompression of the Gallbladder.  09-01-18: DG small bowel w single: Diffuse small bowel dilatation is noted. The contrast administered through nasogastric tube with seen to pool within  the stomach and not advance on the initial images. Delayed images demonstrate no contrast present in the large or small bowel. These findings are consistent with either ileus or distal small bowel obstruction.  09-05-18: ct of abdomen an pelvis:  1. Massive dilatation of the small bowel, without definite transition point. Small volume of gas and stool in a relatively decompressed colon. Findings are favored to reflect small bowel ileus, which has been evident on numerous prior abdominal studies. The possibility of partial or early small-bowel obstruction is not entirely excluded, but not strongly favored at this time. 2. Small amount of gas non dependently in the urinary bladder, favored to be iatrogenic. In the absence of recent catheterization, correlation with urinalysis would be strongly recommended to exclude the possibility of urinary tract  infection with gas-forming organisms. 3. Multiple nonobstructive calculi within the left renal collecting system and lying dependently in the lumen of the urinary bladder. No ureteral stones are noted at this time. Despite this, there is moderate right and severe left hydroureteronephrosis, which could be indicative of distal ureteral strictures, and is presumably exacerbated in the setting of a markedly distended urinary bladder. Urologic consultation is recommended. 4. Moderate right and small left pleural effusions lying dependently with some associated passive subsegmental atelectasis throughout the lower lobes of the lungs bilaterally. 5. Patulous fluid-filled esophagus which demonstrates mural thickening and hyper enhancement, which may simply reflect esophagitis, however, there is any clinical concern for Barrett'  metaplasia or esophageal neoplasia, further evaluation with endoscopy could be considered. 6. Additional incidental findings, as above.  4-20: ct of abdomen:  1. Diffusely dilated and air-filled loops of small bowel similar to prior CT and  may represent an ileus. A degree of small-bowel obstruction is not entirely excluded. Small-bowel series may provide better evaluation. Clinical correlation and follow-up recommended. 2. No free air or pneumatosis.  No portal venous gas. 3. Second frontal thickening of the distal esophagus may represent esophagitis although infiltrative mass is not excluded. Clinical correlation is recommended. 4. Percutaneous cholecystostomy with contracted gallbladder. 5. Nonobstructing left renal calculi. No hydronephrosis.  09-18-18: small bowel series: Significantly dilated small bowel loops though no evidence of bowel obstruction is identified. Cholelithiasis.  09-26-18: ct of abdomen and pelvis:  1. Substantially limited in areas due to streak artifact from retained barium in the large bowel. 2. Massively dilated air and fluid containing small bowel loops which gradually taper to the colon. The barium contrast administered on 09/18/2018 has reached the rectum, indicating partial if any mechanical obstruction. As on the recent small bowel follow-through radiographs, severe ileus/dysmotility seems more likely. 3. No free air.  No new abnormality identified. 4. Severe bladder wall thickening with Foley catheter in place. Left renal staghorn calculi. Cholecystostomy/subhepatic drain.  NO NEW EXAMS.    LABS REVIEWED PREVIOUS;  08-25-18: wbc 11.4; hgb 15.3; hct 49.4; mcv 93.0; plt 243; glucose 164; bun 30; creat 1.16; k+ 4.2; na++ 136; ca 8.6; liver normal albumin 4.4  08-28-18: wbc 11.7; hgb 13.0; hct 40.9; mcv 91.1; plt 220; glucose 122; bun 34; creat 1.79; k+ 3.7; na++ 144; ca 9.0 phos 3.3 mag 2.0  08-31-18: glucose 121; bun 28; create 1.26; k+ 3.4; na++ 146; ca 8.4 phos 2.2; mag 1.9; pre-albumin 10.1 09-02-18: glucose 114; bun 23; creat 1.07; k+ 3.4; na++ 143; ca 8.1; liver normal albumin 2.4 phos 2.5 mag 1.8 09-05-18: wbc 11.6; hgb 11.9; hct 37.9; mcv 90.7 plt 303; glucose 152; bun 11; creat 1.08; k+ 3.5;  na++ 138; ca 9.3 liver normal albumin 4.1  09-08-18: wbc 10.9; hgb 8.7; hct 91.7; plt 225; glucose 165; bun 15; creat 1.11; k+ 3.2; na++ 140; ca 8.1 mag 1.8  09-09-18: wbc 6.1; hgb 8.9; hct 29.0; mcv 92.4 plt 254 glucose 149; bun 7; creat 0.89; k+ 3.3; na++ 138; ca 8.1 mag 1.6  09-15-18: wbc 5.7; hgb 9.8; hct 31.6; mcv 92.1 plt 327; glucose 148; bun 14; creat 1.00; k+ 4.5; na++ 136; ca 9.3; mag 1.9 09-18-18: wbc 9.2; hgb 12.0; hct 38.2; mcv 89.7 ;plt 356; glucose 214; bun 21; creat 1.21; k+ 4.9; na++ 132; ca 8.6; liver normal albumin 4.0; mag 2.0 09-19-18: wbc 6.2; hgb 10.2; hct 32.3; mcv 90.5; plt 276; glucose 152; bun 25; creat 1.08; k+ 5.1; na++ 135; ca 9.4; liver  normal albumin 3.5; urine culture: e-coli/enterococcus faecalis 09-25-18: wbc 9.5; hgb 12.0; hct 36.9; mcv 88.5; plt 273; glucose 216; bun 22; creat 1.02; k+ 3.2; na++ 133; ca 9.6; liver normal albumin 4.0   NO NEW LABS.    Review of Systems  Reason unable to perform ROS: intellectually challenged nonverbal      Physical Exam Constitutional:      General: He is not in acute distress.    Appearance: He is well-developed. He is not diaphoretic.  Neck:     Musculoskeletal: Neck supple.     Thyroid: No thyromegaly.  Cardiovascular:     Rate and Rhythm: Normal rate and regular rhythm.     Pulses: Normal pulses.     Heart sounds: Normal heart sounds.  Pulmonary:     Effort: Pulmonary effort is normal. No respiratory distress.     Breath sounds: Normal breath sounds.  Abdominal:     General: Bowel sounds are normal. There is no distension.     Palpations: Abdomen is soft.     Tenderness: There is no abdominal tenderness.     Comments: Bili drain   Genitourinary:    Comments: Has foley  Musculoskeletal: Normal range of motion.     Right lower leg: No edema.     Left lower leg: No edema.  Lymphadenopathy:     Cervical: No cervical adenopathy.  Skin:    General: Skin is warm and dry.  Neurological:     Mental Status: He is  alert. Mental status is at baseline.  Psychiatric:        Mood and Affect: Mood normal.      ASSESSMENT/ PLAN:  TODAY:   1. SBO (small bowel obstruction) /gangrene cholecystitis/volvulus: is status post exploratory lap with reduction of volvulus and bili drain. Will continue mylicon 40 mg four times daily motergrity 2 mg daily miralax daily   2. Hypokalemia/magnesemia: stable will continue mag ox 400 mg daily and k phos neutral daily   3. Protein calorie malnutrition severe: is stable albumin 4.0 pre-albumin 10.1; will continue supplements as directed.   PREVIOUS  4. Diabetes mellitus type 2 noninsulin dependent; is table will continue metformin 1 gm daily and asa 81 mg daily  5. Benign prostatic hyperplasia with urinary obstruction: is stable has foley; will continue proscar 5 mg daily and flomax 0.4 mg daily   6. Acute blood loss anemia: is stable hgb 12.0 will monitor     MD is aware of resident's narcotic use and is in agreement with current plan of care. We will attempt to wean resident as apropriate   Ok Edwards NP [facility]  Contact [phone] Monday through Friday 8am- 5pm  After hours call [phone]

## 2018-10-14 ENCOUNTER — Other Ambulatory Visit (HOSPITAL_COMMUNITY)
Admission: RE | Admit: 2018-10-14 | Discharge: 2018-10-14 | Disposition: A | Discharge status: Home / Self Care | Payer: Medicare Other | Source: Skilled Nursing Facility | Attending: Adult Health | Admitting: Adult Health

## 2018-10-14 DIAGNOSIS — I10 Essential (primary) hypertension: Secondary | ICD-10-CM | POA: Diagnosis present

## 2018-10-14 LAB — CBC
HCT: 31 % — ABNORMAL LOW (ref 39.0–52.0)
Hemoglobin: 9.7 g/dL — ABNORMAL LOW (ref 13.0–17.0)
MCH: 28.4 pg (ref 26.0–34.0)
MCHC: 31.3 g/dL (ref 30.0–36.0)
MCV: 90.9 fL (ref 80.0–100.0)
Platelets: 213 10*3/uL (ref 150–400)
RBC: 3.41 MIL/uL — ABNORMAL LOW (ref 4.22–5.81)
RDW: 14 % (ref 11.5–15.5)
WBC: 5.4 10*3/uL (ref 4.0–10.5)
nRBC: 0 % (ref 0.0–0.2)

## 2018-10-15 ENCOUNTER — Encounter: Payer: Self-pay | Admitting: Adult Health

## 2018-10-15 ENCOUNTER — Non-Acute Institutional Stay (SKILLED_NURSING_FACILITY): Payer: Medicare Other | Admitting: Adult Health

## 2018-10-15 DIAGNOSIS — E43 Unspecified severe protein-calorie malnutrition: Secondary | ICD-10-CM | POA: Diagnosis not present

## 2018-10-15 DIAGNOSIS — K559 Vascular disorder of intestine, unspecified: Secondary | ICD-10-CM

## 2018-10-15 DIAGNOSIS — K562 Volvulus: Secondary | ICD-10-CM

## 2018-10-15 NOTE — Progress Notes (Signed)
Location:    [facility] Room Number: 127/D Place of Service:  SNF (31)   CODE STATUS: Full Code  No Known Allergies  Chief Complaint  Patient presents with  . Acute Visit    Care Plan Meeting    HPI:  We have come together for a discharge planning meeting. He does have family present via phone. His goals have been met in therapy. He is ready for discharge back home. He will need his foley removed prior to his discharge; they prefer to I/O cath. He has all need dme; but will need a glucometer. There are on reports of diarrheal stools; no reports changes in appetite; no reports of uncontrolled pain.   Past Medical History:  Diagnosis Date  . Diabetes mellitus without complication ([place])   . History of gout   . History of kidney stones   . Hypertension   . Mentally challenged   . Urinary retention     Past Surgical History:  Procedure Laterality Date  . CYSTOSCOPY W/ URETERAL STENT PLACEMENT Bilateral 06/20/2016   Procedure: CYSTOSCOPY WITH RETROGRADE PYELOGRAM/URETERAL STENT PLACEMENT;  Surgeon: Cleon Gustin, MD;  Location: [facility];  Service: Urology;  Laterality: Bilateral;  . CYSTOSCOPY WITH RETROGRADE PYELOGRAM, URETEROSCOPY AND STENT PLACEMENT Bilateral 08/20/2016   Procedure: CYSTOSCOPY WITH BILATERAL RETROGRADE PYELOGRAM, BILATERAL URETEROSCOPY,  LASER LITHOTRIPSY OF RIGHT URETERAL CALCULI, STONE BASKET EXTRACTION LEFT URETERAL CALCULI  AND BILATERAL URETERAL STENT EXCHANGE;  Surgeon: Cleon Gustin, MD;  Location: [facility];  Service: Urology;  Laterality: Bilateral;  . HOLMIUM LASER APPLICATION Bilateral 06/16/4399   Procedure: HOLMIUM LASER APPLICATION;  Surgeon: Cleon Gustin, MD;  Location: [facility];  Service: Urology;  Laterality: Bilateral;  . IR CATHETER TUBE CHANGE  07/04/2018  . IR EXCHANGE BILIARY DRAIN  06/09/2018  . IR PERC CHOLECYSTOSTOMY  05/24/2018  . LAPAROTOMY N/A 08/25/2018   Procedure: EXPLORATORY LAPAROTOMY reduction of  volvulus;  Surgeon: Virl Cagey, MD;  Location: [facility];  Service: General;  Laterality: N/A;    Social History   Socioeconomic History  . Marital status: Single    Spouse name: Not on file  . Number of children: Not on file  . Years of education: Not on file  . Highest education level: Not on file  Occupational History  . Not on file  Social Needs  . Financial resource strain: Not on file  . Food insecurity    Worry: Not on file    Inability: Not on file  . Transportation needs    Medical: Not on file    Non-medical: Not on file  Tobacco Use  . Smoking status: Never Smoker  . Smokeless tobacco: Never Used  Substance and Sexual Activity  . Alcohol use: No  . Drug use: No  . Sexual activity: Never    Birth control/protection: None  Lifestyle  . Physical activity    Days per week: Not on file    Minutes per session: Not on file  . Stress: Not on file  Relationships  . Social Herbalist on phone: Not on file    Gets together: Not on file    Attends religious service: Not on file    Active member of club or organization: Not on file    Attends meetings of clubs or organizations: Not on file    Relationship status: Not on file  . Intimate partner violence    Fear of current or ex partner: Not on  file    Emotionally abused: Not on file    Physically abused: Not on file    Forced sexual activity: Not on file  Other Topics Concern  . Not on file  Social History Narrative   Patient is delayed lives with family   Family History  Problem Relation Age of Onset  . Gallbladder disease Other       VITAL SIGNS BP (!) 145/78   Pulse 79   Temp 98.7 F (37.1 C) (Oral)   Resp 20   Wt 145 lb 9.6 oz (66 kg)   BMI 22.80 kg/m   Outpatient Encounter Medications as of 10/15/2018  Medication Sig  . Acetaminophen (TYLENOL ARTHRITIS PAIN PO) Take 1 tablet by mouth every 6 (six) hours as needed (pain).  Marland Kitchen aspirin EC 81 MG tablet Take 81 mg by mouth every  morning.   Roseanne Kaufman [place]-Castor Oil (VENELEX) OINT Apply topically to sacrum and bilateral buttocks every shift and as needed for blanchable erythema and prevention  . finasteride (PROSCAR) 5 MG tablet Take 5 mg by mouth daily.  . magnesium oxide (MAG-OX) 400 MG tablet Take 400 mg by mouth daily.  . metFORMIN (GLUCOPHAGE) 500 MG tablet Take 1,000 mg by mouth every morning.  . NON FORMULARY Diet Type:  Soft Diet, NAS, Cons CHO  . Nutritional Supplements (ENSURE ENLIVE PO) Take 1 Bottle by mouth 3 (three) times daily.  . phosphorus (K PHOS NEUTRAL) 155-852-130 MG tablet Take 1 tablet (250 mg total) by mouth daily.  . polyethylene glycol (MIRALAX / GLYCOLAX) 17 g packet Take 17 g by mouth daily.  . Prucalopride Succinate 2 MG TABS Take 1 tablet by mouth daily.  . simethicone (MYLICON) 40 OY/7.7AJ drops Take 0.6 mLs (40 mg total) by mouth 4 (four) times daily.  . sodium chloride 0.9 % injection Inject 10 mLs into the vein every other day.  . tamsulosin (FLOMAX) 0.4 MG CAPS capsule Take 1 capsule (0.4 mg total) by mouth every evening.   No facility-administered encounter medications on file as of 10/15/2018.      SIGNIFICANT DIAGNOSTIC EXAMS   PREVIOUS;   08-25-18: ct of abdomen and pelvis:  1. High-grade small bowel obstruction with pneumatosis of the mesenteric vessels concerning for bowel ischemia. Clinical correlation and surgical consult is advised. 2. Distended stomach with ingested content in the distal esophagus. 3. Multiple nonobstructing left renal calculi. A partially obstructing stone or string of adjacent stones in the left UPJ with associated mild left hydronephrosis. Multiple bladder calculi. 4. Percutaneous cholecystostomy tube with decompression of the Gallbladder.  09-01-18: DG small bowel w single: Diffuse small bowel dilatation is noted. The contrast administered through nasogastric tube with seen to pool within the stomach and not advance on the initial images. Delayed  images demonstrate no contrast present in the large or small bowel. These findings are consistent with either ileus or distal small bowel obstruction.  09-05-18: ct of abdomen an pelvis:  1. Massive dilatation of the small bowel, without definite transition point. Small volume of gas and stool in a relatively decompressed colon. Findings are favored to reflect small bowel ileus, which has been evident on numerous prior abdominal studies. The possibility of partial or early small-bowel obstruction is not entirely excluded, but not strongly favored at this time. 2. Small amount of gas non dependently in the urinary bladder, favored to be iatrogenic. In the absence of recent catheterization, correlation with urinalysis would be strongly recommended to exclude the possibility of urinary tract  infection with gas-forming organisms. 3. Multiple nonobstructive calculi within the left renal collecting system and lying dependently in the lumen of the urinary bladder. No ureteral stones are noted at this time. Despite this, there is moderate right and severe left hydroureteronephrosis, which could be indicative of distal ureteral strictures, and is presumably exacerbated in the setting of a markedly distended urinary bladder. Urologic consultation is recommended. 4. Moderate right and small left pleural effusions lying dependently with some associated passive subsegmental atelectasis throughout the lower lobes of the lungs bilaterally. 5. Patulous fluid-filled esophagus which demonstrates mural thickening and hyper enhancement, which may simply reflect esophagitis, however, there is any clinical concern for Barrett'  metaplasia or esophageal neoplasia, further evaluation with endoscopy could be considered. 6. Additional incidental findings, as above.  4-20: ct of abdomen:  1. Diffusely dilated and air-filled loops of small bowel similar to prior CT and may represent an ileus. A degree of small-bowel obstruction  is not entirely excluded. Small-bowel series may provide better evaluation. Clinical correlation and follow-up recommended. 2. No free air or pneumatosis.  No portal venous gas. 3. Second frontal thickening of the distal esophagus may represent esophagitis although infiltrative mass is not excluded. Clinical correlation is recommended. 4. Percutaneous cholecystostomy with contracted gallbladder. 5. Nonobstructing left renal calculi. No hydronephrosis.  09-18-18: small bowel series: Significantly dilated small bowel loops though no evidence of bowel obstruction is identified. Cholelithiasis.  09-26-18: ct of abdomen and pelvis:  1. Substantially limited in areas due to streak artifact from retained barium in the large bowel. 2. Massively dilated air and fluid containing small bowel loops which gradually taper to the colon. The barium contrast administered on 09/18/2018 has reached the rectum, indicating partial if any mechanical obstruction. As on the recent small bowel follow-through radiographs, severe ileus/dysmotility seems more likely. 3. No free air.  No new abnormality identified. 4. Severe bladder wall thickening with Foley catheter in place. Left renal staghorn calculi. Cholecystostomy/subhepatic drain.  NO NEW EXAMS.    LABS REVIEWED PREVIOUS;  08-25-18: wbc 11.4; hgb 15.3; hct 49.4; mcv 93.0; plt 243; glucose 164; bun 30; creat 1.16; k+ 4.2; na++ 136; ca 8.6; liver normal albumin 4.4  08-28-18: wbc 11.7; hgb 13.0; hct 40.9; mcv 91.1; plt 220; glucose 122; bun 34; creat 1.79; k+ 3.7; na++ 144; ca 9.0 phos 3.3 mag 2.0  08-31-18: glucose 121; bun 28; create 1.26; k+ 3.4; na++ 146; ca 8.4 phos 2.2; mag 1.9; pre-albumin 10.1 09-02-18: glucose 114; bun 23; creat 1.07; k+ 3.4; na++ 143; ca 8.1; liver normal albumin 2.4 phos 2.5 mag 1.8 09-05-18: wbc 11.6; hgb 11.9; hct 37.9; mcv 90.7 plt 303; glucose 152; bun 11; creat 1.08; k+ 3.5; na++ 138; ca 9.3 liver normal albumin 4.1  09-08-18: wbc  10.9; hgb 8.7; hct 91.7; plt 225; glucose 165; bun 15; creat 1.11; k+ 3.2; na++ 140; ca 8.1 mag 1.8  09-09-18: wbc 6.1; hgb 8.9; hct 29.0; mcv 92.4 plt 254 glucose 149; bun 7; creat 0.89; k+ 3.3; na++ 138; ca 8.1 mag 1.6  09-15-18: wbc 5.7; hgb 9.8; hct 31.6; mcv 92.1 plt 327; glucose 148; bun 14; creat 1.00; k+ 4.5; na++ 136; ca 9.3; mag 1.9 09-18-18: wbc 9.2; hgb 12.0; hct 38.2; mcv 89.7 ;plt 356; glucose 214; bun 21; creat 1.21; k+ 4.9; na++ 132; ca 8.6; liver normal albumin 4.0; mag 2.0 09-19-18: wbc 6.2; hgb 10.2; hct 32.3; mcv 90.5; plt 276; glucose 152; bun 25; creat 1.08; k+ 5.1; na++ 135; ca 9.4; liver  normal albumin 3.5; urine culture: e-coli/enterococcus faecalis 09-25-18: wbc 9.5; hgb 12.0; hct 36.9; mcv 88.5; plt 273; glucose 216; bun 22; creat 1.02; k+ 3.2; na++ 133; ca 9.6; liver normal albumin 4.0   NO NEW LABS.    Review of Systems  Reason unable to perform ROS: intellectually challanged; nonverbal     Physical Exam Constitutional:      General: He is not in acute distress.    Appearance: He is well-developed. He is not diaphoretic.  Neck:     Musculoskeletal: Neck supple.     Thyroid: No thyromegaly.  Cardiovascular:     Rate and Rhythm: Normal rate and regular rhythm.     Pulses: Normal pulses.     Heart sounds: Normal heart sounds.  Pulmonary:     Effort: Pulmonary effort is normal. No respiratory distress.     Breath sounds: Normal breath sounds.  Abdominal:     General: Bowel sounds are normal. There is no distension.     Palpations: Abdomen is soft.     Tenderness: There is no abdominal tenderness.     Comments: Bili drain    Genitourinary:    Comments: Foley  Musculoskeletal: Normal range of motion.     Right lower leg: No edema.     Left lower leg: No edema.  Lymphadenopathy:     Cervical: No cervical adenopathy.  Skin:    General: Skin is warm and dry.  Neurological:     Mental Status: He is alert. Mental status is at baseline.  Psychiatric:        Mood  and Affect: Mood normal.     ASSESSMENT/ PLAN:  TODAY  1. Volvulus 2. Small bowel ischemia 3. Protein calorie malnutrition severe  Will continue therapy as directed Will continue current medications Will continue to monitor his status His goal is go home next week.     MD is aware of resident's narcotic use and is in agreement with current plan of care. We will attempt to wean resident as apropriate   Ok Edwards NP [facility]  Contact [phone] Monday through Friday 8am- 5pm  After hours call [phone]

## 2018-10-17 ENCOUNTER — Telehealth: Payer: Self-pay | Admitting: Student

## 2018-10-17 NOTE — Telephone Encounter (Signed)
Received a call from [facility] re: cholecystostomy tube. Skin suture has come loose.   Patient has an appointment in IR early next week for exchange.   Per RN, calling due to concern for decreased output over the past 24 hrs.  Tube remains in place.  Patient without fever, chills, nausea, vomiting, abdominal pain. VSS. He is eating and drinking per his usual. Plan to keep appointment at this time and will discuss with Dr. Laurence Ferrari.  Instructed RN to secure tube to patient to prevent further slipping of tube. RN voices understanding of plan and knows to contact us if patient develops concerning symptoms.    Brynda Greathouse, MS RD PA-C 10:42 AM

## 2018-10-20 ENCOUNTER — Encounter: Payer: Self-pay | Admitting: Adult Health

## 2018-10-20 ENCOUNTER — Ambulatory Visit (HOSPITAL_COMMUNITY)
Admit: 2018-10-20 | Discharge: 2018-10-20 | Disposition: A | Discharge status: Home / Self Care | Payer: Medicare Other | Attending: Internal Medicine | Admitting: Internal Medicine

## 2018-10-20 ENCOUNTER — Ambulatory Visit (HOSPITAL_COMMUNITY)
Admission: RE | Admit: 2018-10-20 | Discharge: 2018-10-20 | Disposition: A | Discharge status: Home / Self Care | Payer: Medicare Other | Source: Ambulatory Visit | Attending: Interventional Radiology | Admitting: Interventional Radiology

## 2018-10-20 ENCOUNTER — Non-Acute Institutional Stay (SKILLED_NURSING_FACILITY): Payer: Medicare Other | Admitting: Adult Health

## 2018-10-20 DIAGNOSIS — K820 Obstruction of gallbladder: Secondary | ICD-10-CM | POA: Diagnosis not present

## 2018-10-20 DIAGNOSIS — K56609 Unspecified intestinal obstruction, unspecified as to partial versus complete obstruction: Secondary | ICD-10-CM | POA: Diagnosis not present

## 2018-10-20 DIAGNOSIS — Z434 Encounter for attention to other artificial openings of digestive tract: Secondary | ICD-10-CM | POA: Diagnosis not present

## 2018-10-20 DIAGNOSIS — K562 Volvulus: Secondary | ICD-10-CM | POA: Diagnosis not present

## 2018-10-20 HISTORY — PX: IR EXCHANGE BILIARY DRAIN: IMG6046

## 2018-10-20 MED ORDER — LIDOCAINE HCL (PF) 1 % IJ SOLN
INTRAMUSCULAR | Status: DC | PRN
  Administered 2018-10-20: 5 mL

## 2018-10-20 MED ORDER — IOHEXOL 300 MG/ML  SOLN: 15.0000 mL | Freq: Once | INTRAMUSCULAR | Status: DC | PRN

## 2018-10-20 MED ORDER — LIDOCAINE HCL 1 % IJ SOLN
INTRAMUSCULAR | Status: AC
  Filled 2018-10-20: qty 20

## 2018-10-20 NOTE — Procedures (Signed)
Interventional Radiology Procedure:   Indications: Routine exchange of cholecystostomy tube  Procedure: Drain exchange  Findings: New 12 Fr drain in place.  Stone filled gallbladder, occluded cystic duct.  Complications: None     EBL: less than 10 ml  Plan: Continue gravity drain and plan for routine exchange.     [name] R. Anselm Pancoast, MD  Pager: [phone]

## 2018-10-20 NOTE — Progress Notes (Signed)
Location:   [facility] Room Number: 127 Place of Service:  SNF ([id number])   CODE STATUS: full code   No Known Allergies  Chief Complaint  Patient presents with  . Acute Visit    abdominal distention    HPI:  Staff reports that his abdomen is significantly swollen. He went today and his bili drain changed. There are no reports of fevers; no reports of n/v. There are no reports of constipation. He has a history of a volvulus and small bowel obstruction. He is due to go home this week.   Past Medical History:  Diagnosis Date  . Diabetes mellitus without complication (HCC)   . History of gout   . History of kidney stones   . Hypertension   . Mentally challenged   . Urinary retention     Past Surgical History:  Procedure Laterality Date  . CYSTOSCOPY W/ URETERAL STENT PLACEMENT Bilateral 06/20/2016   Procedure: CYSTOSCOPY WITH RETROGRADE PYELOGRAM/URETERAL STENT PLACEMENT;  Surgeon: Malen GauzePatrick L McKenzie, MD;  Location: [facility];  Service: Urology;  Laterality: Bilateral;  . CYSTOSCOPY WITH RETROGRADE PYELOGRAM, URETEROSCOPY AND STENT PLACEMENT Bilateral 08/20/2016   Procedure: CYSTOSCOPY WITH BILATERAL RETROGRADE PYELOGRAM, BILATERAL URETEROSCOPY,  LASER LITHOTRIPSY OF RIGHT URETERAL CALCULI, STONE BASKET EXTRACTION LEFT URETERAL CALCULI  AND BILATERAL URETERAL STENT EXCHANGE;  Surgeon: Malen GauzeMcKenzie, Patrick L, MD;  Location: [facility];  Service: Urology;  Laterality: Bilateral;  . HOLMIUM LASER APPLICATION Bilateral 08/20/2016   Procedure: HOLMIUM LASER APPLICATION;  Surgeon: Malen GauzeMcKenzie, Patrick L, MD;  Location: [facility];  Service: Urology;  Laterality: Bilateral;  . IR CATHETER TUBE CHANGE  07/04/2018  . IR EXCHANGE BILIARY DRAIN  06/09/2018  . IR PERC CHOLECYSTOSTOMY  05/24/2018  . LAPAROTOMY N/A 08/25/2018   Procedure: EXPLORATORY LAPAROTOMY reduction of volvulus;  Surgeon: Lucretia RoersBridges, Lindsay C, MD;  Location: [facility];  Service: General;  Laterality: N/A;    Social History   Socioeconomic  History  . Marital status: Single    Spouse name: Not on file  . Number of children: Not on file  . Years of education: Not on file  . Highest education level: Not on file  Occupational History  . Not on file  Social Needs  . Financial resource strain: Not on file  . Food insecurity    Worry: Not on file    Inability: Not on file  . Transportation needs    Medical: Not on file    Non-medical: Not on file  Tobacco Use  . Smoking status: Never Smoker  . Smokeless tobacco: Never Used  Substance and Sexual Activity  . Alcohol use: No  . Drug use: No  . Sexual activity: Never    Birth control/protection: None  Lifestyle  . Physical activity    Days per week: Not on file    Minutes per session: Not on file  . Stress: Not on file  Relationships  . Social Musicianconnections    Talks on phone: Not on file    Gets together: Not on file    Attends religious service: Not on file    Active member of club or organization: Not on file    Attends meetings of clubs or organizations: Not on file    Relationship status: Not on file  . Intimate partner violence    Fear of current or ex partner: Not on file    Emotionally abused: Not on file    Physically abused: Not on file    Forced  sexual activity: Not on file  Other Topics Concern  . Not on file  Social History Narrative   Patient is delayed lives with family   Family History  Problem Relation Age of Onset  . Gallbladder disease Other       VITAL SIGNS BP 104/69   Pulse 93   Temp 98.7 F (37.1 C)   Resp 16   Ht 5\' 7"  (1.702 m)   Wt 140 lb 9.6 oz (63.8 kg)   BMI 22.02 kg/m   Facility-Administered Encounter Medications as of 10/20/2018  Medication  . iohexol (OMNIPAQUE) 300 MG/ML solution 15 mL  . lidocaine (PF) (XYLOCAINE) 1 % injection  . lidocaine (XYLOCAINE) 1 % (with pres) injection   Outpatient Encounter Medications as of 10/20/2018  Medication Sig  . Acetaminophen (TYLENOL ARTHRITIS PAIN PO) Take 1 tablet by mouth  every 6 (six) hours as needed (pain).  Marland Kitchen. aspirin EC 81 MG tablet Take 81 mg by mouth every morning.   Lucilla Lame. Balsam [place]-Castor Oil (VENELEX) OINT Apply topically to sacrum and bilateral buttocks every shift and as needed for blanchable erythema and prevention  . finasteride (PROSCAR) 5 MG tablet Take 5 mg by mouth daily.  . magnesium oxide (MAG-OX) 400 MG tablet Take 400 mg by mouth daily.  . metFORMIN (GLUCOPHAGE) 500 MG tablet Take 1,000 mg by mouth every morning.  . NON FORMULARY Diet Type:  Soft Diet, NAS, Cons CHO  . Nutritional Supplements (ENSURE ENLIVE PO) Take 1 Bottle by mouth 3 (three) times daily.  . phosphorus (K PHOS NEUTRAL) 155-852-130 MG tablet Take 1 tablet (250 mg total) by mouth daily.  . polyethylene glycol (MIRALAX / GLYCOLAX) 17 g packet Take 17 g by mouth daily.  . Prucalopride Succinate 2 MG TABS Take 1 tablet by mouth daily.  . simethicone (MYLICON) 40 MG/0.6ML drops Take 0.6 mLs (40 mg total) by mouth 4 (four) times daily.  . sodium chloride 0.9 % injection Inject 10 mLs into the vein every other day.  . tamsulosin (FLOMAX) 0.4 MG CAPS capsule Take 1 capsule (0.4 mg total) by mouth every evening.     SIGNIFICANT DIAGNOSTIC EXAMS    PREVIOUS;   08-25-18: ct of abdomen and pelvis:  1. High-grade small bowel obstruction with pneumatosis of the mesenteric vessels concerning for bowel ischemia. Clinical correlation and surgical consult is advised. 2. Distended stomach with ingested content in the distal esophagus. 3. Multiple nonobstructing left renal calculi. A partially obstructing stone or string of adjacent stones in the left UPJ with associated mild left hydronephrosis. Multiple bladder calculi. 4. Percutaneous cholecystostomy tube with decompression of the Gallbladder.  09-01-18: DG small bowel w single: Diffuse small bowel dilatation is noted. The contrast administered through nasogastric tube with seen to pool within the stomach and not advance on the initial  images. Delayed images demonstrate no contrast present in the large or small bowel. These findings are consistent with either ileus or distal small bowel obstruction.  09-05-18: ct of abdomen an pelvis:  1. Massive dilatation of the small bowel, without definite transition point. Small volume of gas and stool in a relatively decompressed colon. Findings are favored to reflect small bowel ileus, which has been evident on numerous prior abdominal studies. The possibility of partial or early small-bowel obstruction is not entirely excluded, but not strongly favored at this time. 2. Small amount of gas non dependently in the urinary bladder, favored to be iatrogenic. In the absence of recent catheterization, correlation with urinalysis would be  strongly recommended to exclude the possibility of urinary tract infection with gas-forming organisms. 3. Multiple nonobstructive calculi within the left renal collecting system and lying dependently in the lumen of the urinary bladder. No ureteral stones are noted at this time. Despite this, there is moderate right and severe left hydroureteronephrosis, which could be indicative of distal ureteral strictures, and is presumably exacerbated in the setting of a markedly distended urinary bladder. Urologic consultation is recommended. 4. Moderate right and small left pleural effusions lying dependently with some associated passive subsegmental atelectasis throughout the lower lobes of the lungs bilaterally. 5. Patulous fluid-filled esophagus which demonstrates mural thickening and hyper enhancement, which may simply reflect esophagitis, however, there is any clinical concern for Barrett'  metaplasia or esophageal neoplasia, further evaluation with endoscopy could be considered. 6. Additional incidental findings, as above.  4-20: ct of abdomen:  1. Diffusely dilated and air-filled loops of small bowel similar to prior CT and may represent an ileus. A degree of  small-bowel obstruction is not entirely excluded. Small-bowel series may provide better evaluation. Clinical correlation and follow-up recommended. 2. No free air or pneumatosis.  No portal venous gas. 3. Second frontal thickening of the distal esophagus may represent esophagitis although infiltrative mass is not excluded. Clinical correlation is recommended. 4. Percutaneous cholecystostomy with contracted gallbladder. 5. Nonobstructing left renal calculi. No hydronephrosis.  09-18-18: small bowel series: Significantly dilated small bowel loops though no evidence of bowel obstruction is identified. Cholelithiasis.  09-26-18: ct of abdomen and pelvis:  1. Substantially limited in areas due to streak artifact from retained barium in the large bowel. 2. Massively dilated air and fluid containing small bowel loops which gradually taper to the colon. The barium contrast administered on 09/18/2018 has reached the rectum, indicating partial if any mechanical obstruction. As on the recent small bowel follow-through radiographs, severe ileus/dysmotility seems more likely. 3. No free air.  No new abnormality identified. 4. Severe bladder wall thickening with Foley catheter in place. Left renal staghorn calculi. Cholecystostomy/subhepatic drain.  NO NEW EXAMS.    LABS REVIEWED PREVIOUS;  08-25-18: wbc 11.4; hgb 15.3; hct 49.4; mcv 93.0; plt 243; glucose 164; bun 30; creat 1.16; k+ 4.2; na++ 136; ca 8.6; liver normal albumin 4.4  08-28-18: wbc 11.7; hgb 13.0; hct 40.9; mcv 91.1; plt 220; glucose 122; bun 34; creat 1.79; k+ 3.7; na++ 144; ca 9.0 phos 3.3 mag 2.0  08-31-18: glucose 121; bun 28; create 1.26; k+ 3.4; na++ 146; ca 8.4 phos 2.2; mag 1.9; pre-albumin 10.1 09-02-18: glucose 114; bun 23; creat 1.07; k+ 3.4; na++ 143; ca 8.1; liver normal albumin 2.4 phos 2.5 mag 1.8 09-05-18: wbc 11.6; hgb 11.9; hct 37.9; mcv 90.7 plt 303; glucose 152; bun 11; creat 1.08; k+ 3.5; na++ 138; ca 9.3 liver normal  albumin 4.1  09-08-18: wbc 10.9; hgb 8.7; hct 91.7; plt 225; glucose 165; bun 15; creat 1.11; k+ 3.2; na++ 140; ca 8.1 mag 1.8  09-09-18: wbc 6.1; hgb 8.9; hct 29.0; mcv 92.4 plt 254 glucose 149; bun 7; creat 0.89; k+ 3.3; na++ 138; ca 8.1 mag 1.6  09-15-18: wbc 5.7; hgb 9.8; hct 31.6; mcv 92.1 plt 327; glucose 148; bun 14; creat 1.00; k+ 4.5; na++ 136; ca 9.3; mag 1.9 09-18-18: wbc 9.2; hgb 12.0; hct 38.2; mcv 89.7 ;plt 356; glucose 214; bun 21; creat 1.21; k+ 4.9; na++ 132; ca 8.6; liver normal albumin 4.0; mag 2.0 09-19-18: wbc 6.2; hgb 10.2; hct 32.3; mcv 90.5; plt 276; glucose 152; bun 25;  creat 1.08; k+ 5.1; na++ 135; ca 9.4; liver normal albumin 3.5; urine culture: e-coli/enterococcus faecalis 09-25-18: wbc 9.5; hgb 12.0; hct 36.9; mcv 88.5; plt 273; glucose 216; bun 22; creat 1.02; k+ 3.2; na++ 133; ca 9.6; liver normal albumin 4.0   TODAY   09-29-18: wbc 4.9; hgb 9.3; hct 29.9; mcv 92.0 plt 200; glucose 121; bun 81; creat 0.97; k+ 4.1; na++ 137; ca 9.3 10-13-28: wbc 5.4; hgb 9.7; hct 31.0; mcv 90.9; plt 213    Review of Systems  Reason unable to perform ROS: intellectually challenged nonverbal    Physical Exam Constitutional:      General: He is not in acute distress.    Appearance: He is well-developed. He is not diaphoretic.  Neck:     Musculoskeletal: Neck supple.     Thyroid: No thyromegaly.  Cardiovascular:     Rate and Rhythm: Normal rate and regular rhythm.     Pulses: Normal pulses.     Heart sounds: Normal heart sounds.  Pulmonary:     Effort: Pulmonary effort is normal. No respiratory distress.     Breath sounds: Normal breath sounds.  Abdominal:     General: There is distension.     Palpations: Abdomen is soft.     Tenderness: There is no abdominal tenderness.     Comments: Abdomen is significantly distended Bowel sounds hyperactive with a tin sound quality   Genitourinary:    Comments: Foley  Musculoskeletal: Normal range of motion.     Right lower leg: No edema.      Left lower leg: No edema.  Lymphadenopathy:     Cervical: No cervical adenopathy.  Skin:    General: Skin is warm and dry.  Neurological:     Mental Status: He is alert. Mental status is at baseline.  Psychiatric:        Mood and Affect: Mood normal.       ASSESSMENT/ PLAN:  TODAY:   1. Volvulus 2. Small bowel obstruction  I have spoken with Dr. Lyn HollingsheadAlexander:  Will get a stat kub Will place a rectal tube today with the goal to prevent further hospitalizations Will continue to monitor his status.    MD is aware of resident's narcotic use and is in agreement with current plan of care. We will attempt to wean resident as apropriate   Synthia Innocenteborah Green NP [facility]  Contact [phone] Monday through Friday 8am- 5pm  After hours call [phone]

## 2018-10-21 ENCOUNTER — Non-Acute Institutional Stay (SKILLED_NURSING_FACILITY): Payer: Medicare Other | Admitting: Adult Health

## 2018-10-21 ENCOUNTER — Ambulatory Visit (HOSPITAL_COMMUNITY)
Admission: RE | Admit: 2018-10-21 | Discharge: 2018-10-21 | Disposition: A | Discharge status: Home / Self Care | Payer: Medicare Other | Source: Ambulatory Visit | Attending: Internal Medicine | Admitting: Internal Medicine

## 2018-10-21 ENCOUNTER — Encounter: Payer: Self-pay | Admitting: Adult Health

## 2018-10-21 DIAGNOSIS — K562 Volvulus: Secondary | ICD-10-CM

## 2018-10-21 DIAGNOSIS — K567 Ileus, unspecified: Secondary | ICD-10-CM | POA: Diagnosis present

## 2018-10-21 DIAGNOSIS — K56609 Unspecified intestinal obstruction, unspecified as to partial versus complete obstruction: Secondary | ICD-10-CM

## 2018-10-21 NOTE — Progress Notes (Signed)
Location:   [facility] Room Number: 127 D Place of Service:  SNF (31)   CODE STATUS: Full Code  No Known Allergies  Chief Complaint  Patient presents with  . Acute Visit    Follow up chest xray    HPI:  His kub yesterday demonstrates a colonic ileus. His abdomen is distended but today is soft bowel sounds are present. He receiving MOM twice he did have a bowel movement this AM. There are no reports of fevers; no n/v.   Past Medical History:  Diagnosis Date  . Diabetes mellitus without complication (HCC)   . History of gout   . History of kidney stones   . Hypertension   . Mentally challenged   . Urinary retention     Past Surgical History:  Procedure Laterality Date  . CYSTOSCOPY W/ URETERAL STENT PLACEMENT Bilateral 06/20/2016   Procedure: CYSTOSCOPY WITH RETROGRADE PYELOGRAM/URETERAL STENT PLACEMENT;  Surgeon: Malen GauzePatrick L McKenzie, MD;  Location: [facility];  Service: Urology;  Laterality: Bilateral;  . CYSTOSCOPY WITH RETROGRADE PYELOGRAM, URETEROSCOPY AND STENT PLACEMENT Bilateral 08/20/2016   Procedure: CYSTOSCOPY WITH BILATERAL RETROGRADE PYELOGRAM, BILATERAL URETEROSCOPY,  LASER LITHOTRIPSY OF RIGHT URETERAL CALCULI, STONE BASKET EXTRACTION LEFT URETERAL CALCULI  AND BILATERAL URETERAL STENT EXCHANGE;  Surgeon: Malen GauzeMcKenzie, Patrick L, MD;  Location: [facility];  Service: Urology;  Laterality: Bilateral;  . HOLMIUM LASER APPLICATION Bilateral 08/20/2016   Procedure: HOLMIUM LASER APPLICATION;  Surgeon: Malen GauzeMcKenzie, Patrick L, MD;  Location: [facility];  Service: Urology;  Laterality: Bilateral;  . IR CATHETER TUBE CHANGE  07/04/2018  . IR EXCHANGE BILIARY DRAIN  06/09/2018  . IR EXCHANGE BILIARY DRAIN  10/20/2018  . IR PERC CHOLECYSTOSTOMY  05/24/2018  . LAPAROTOMY N/A 08/25/2018   Procedure: EXPLORATORY LAPAROTOMY reduction of volvulus;  Surgeon: Lucretia RoersBridges, Lindsay C, MD;  Location: [facility];  Service: General;  Laterality: N/A;    Social History   Socioeconomic History   . Marital status: Single    Spouse name: Not on file  . Number of children: Not on file  . Years of education: Not on file  . Highest education level: Not on file  Occupational History  . Not on file  Social Needs  . Financial resource strain: Not on file  . Food insecurity    Worry: Not on file    Inability: Not on file  . Transportation needs    Medical: Not on file    Non-medical: Not on file  Tobacco Use  . Smoking status: Never Smoker  . Smokeless tobacco: Never Used  Substance and Sexual Activity  . Alcohol use: No  . Drug use: No  . Sexual activity: Never    Birth control/protection: None  Lifestyle  . Physical activity    Days per week: Not on file    Minutes per session: Not on file  . Stress: Not on file  Relationships  . Social Musicianconnections    Talks on phone: Not on file    Gets together: Not on file    Attends religious service: Not on file    Active member of club or organization: Not on file    Attends meetings of clubs or organizations: Not on file    Relationship status: Not on file  . Intimate partner violence    Fear of current or ex partner: Not on file    Emotionally abused: Not on file    Physically abused: Not on file    Forced sexual activity:  Not on file  Other Topics Concern  . Not on file  Social History Narrative   Patient is delayed lives with family   Family History  Problem Relation Age of Onset  . Gallbladder disease Other       VITAL SIGNS BP 104/69   Pulse 93   Temp 98.7 F (37.1 C)   Resp (!) 22   Ht 5\' 7"  (1.702 m)   Wt 138 lb 3.2 oz (62.7 kg)   BMI 21.65 kg/m   Outpatient Encounter Medications as of 10/21/2018  Medication Sig  . Acetaminophen (TYLENOL ARTHRITIS PAIN PO) Take 1 tablet by mouth every 6 (six) hours as needed (pain).  Marland Kitchen. aspirin EC 81 MG tablet Take 81 mg by mouth every morning.   Lucilla Lame. Balsam [place]-Castor Oil (VENELEX) OINT Apply topically to sacrum and bilateral buttocks every shift and as needed for  blanchable erythema and prevention  . finasteride (PROSCAR) 5 MG tablet Take 5 mg by mouth daily.  . magnesium hydroxide (MILK OF MAGNESIA) 400 MG/5ML suspension Take 30 mLs by mouth 3 (three) times daily.  . magnesium oxide (MAG-OX) 400 MG tablet Take 400 mg by mouth daily.  . metFORMIN (GLUCOPHAGE) 500 MG tablet Take 1,000 mg by mouth every morning.  . NON FORMULARY Diet Type:  Soft Diet, NAS, Cons CHO  . Nutritional Supplements (ENSURE ENLIVE PO) Take 1 Bottle by mouth 3 (three) times daily.  . phosphorus (K PHOS NEUTRAL) 155-852-130 MG tablet Take 1 tablet (250 mg total) by mouth daily.  . polyethylene glycol (MIRALAX / GLYCOLAX) 17 g packet Take 17 g by mouth daily.  . Prucalopride Succinate 2 MG TABS Take 1 tablet by mouth daily.  . simethicone (MYLICON) 40 MG/0.6ML drops Take 0.6 mLs (40 mg total) by mouth 4 (four) times daily.  . sodium chloride 0.9 % injection Inject 10 mLs into the vein every other day.  . tamsulosin (FLOMAX) 0.4 MG CAPS capsule Take 1 capsule (0.4 mg total) by mouth every evening.   No facility-administered encounter medications on file as of 10/21/2018.      SIGNIFICANT DIAGNOSTIC EXAMS  PREVIOUS;   08-25-18: ct of abdomen and pelvis:  1. High-grade small bowel obstruction with pneumatosis of the mesenteric vessels concerning for bowel ischemia. Clinical correlation and surgical consult is advised. 2. Distended stomach with ingested content in the distal esophagus. 3. Multiple nonobstructing left renal calculi. A partially obstructing stone or string of adjacent stones in the left UPJ with associated mild left hydronephrosis. Multiple bladder calculi. 4. Percutaneous cholecystostomy tube with decompression of the Gallbladder.  09-01-18: DG small bowel w single: Diffuse small bowel dilatation is noted. The contrast administered through nasogastric tube with seen to pool within the stomach and not advance on the initial images. Delayed images demonstrate no  contrast present in the large or small bowel. These findings are consistent with either ileus or distal small bowel obstruction.  09-05-18: ct of abdomen an pelvis:  1. Massive dilatation of the small bowel, without definite transition point. Small volume of gas and stool in a relatively decompressed colon. Findings are favored to reflect small bowel ileus, which has been evident on numerous prior abdominal studies. The possibility of partial or early small-bowel obstruction is not entirely excluded, but not strongly favored at this time. 2. Small amount of gas non dependently in the urinary bladder, favored to be iatrogenic. In the absence of recent catheterization, correlation with urinalysis would be strongly recommended to exclude the possibility of urinary  tract infection with gas-forming organisms. 3. Multiple nonobstructive calculi within the left renal collecting system and lying dependently in the lumen of the urinary bladder. No ureteral stones are noted at this time. Despite this, there is moderate right and severe left hydroureteronephrosis, which could be indicative of distal ureteral strictures, and is presumably exacerbated in the setting of a markedly distended urinary bladder. Urologic consultation is recommended. 4. Moderate right and small left pleural effusions lying dependently with some associated passive subsegmental atelectasis throughout the lower lobes of the lungs bilaterally. 5. Patulous fluid-filled esophagus which demonstrates mural thickening and hyper enhancement, which may simply reflect esophagitis, however, there is any clinical concern for Barrett'  metaplasia or esophageal neoplasia, further evaluation with endoscopy could be considered. 6. Additional incidental findings, as above.  4-20: ct of abdomen:  1. Diffusely dilated and air-filled loops of small bowel similar to prior CT and may represent an ileus. A degree of small-bowel obstruction is not entirely  excluded. Small-bowel series may provide better evaluation. Clinical correlation and follow-up recommended. 2. No free air or pneumatosis.  No portal venous gas. 3. Second frontal thickening of the distal esophagus may represent esophagitis although infiltrative mass is not excluded. Clinical correlation is recommended. 4. Percutaneous cholecystostomy with contracted gallbladder. 5. Nonobstructing left renal calculi. No hydronephrosis.  09-18-18: small bowel series: Significantly dilated small bowel loops though no evidence of bowel obstruction is identified. Cholelithiasis.  09-26-18: ct of abdomen and pelvis:  1. Substantially limited in areas due to streak artifact from retained barium in the large bowel. 2. Massively dilated air and fluid containing small bowel loops which gradually taper to the colon. The barium contrast administered on 09/18/2018 has reached the rectum, indicating partial if any mechanical obstruction. As on the recent small bowel follow-through radiographs, severe ileus/dysmotility seems more likely. 3. No free air.  No new abnormality identified. 4. Severe bladder wall thickening with Foley catheter in place. Left renal staghorn calculi. Cholecystostomy/subhepatic drain.  TODAY:   10-20-18: kub;  Changes consistent with colonic ileus. Correlation with the physical exam is recommended.    LABS REVIEWED PREVIOUS;  08-25-18: wbc 11.4; hgb 15.3; hct 49.4; mcv 93.0; plt 243; glucose 164; bun 30; creat 1.16; k+ 4.2; na++ 136; ca 8.6; liver normal albumin 4.4  08-28-18: wbc 11.7; hgb 13.0; hct 40.9; mcv 91.1; plt 220; glucose 122; bun 34; creat 1.79; k+ 3.7; na++ 144; ca 9.0 phos 3.3 mag 2.0  08-31-18: glucose 121; bun 28; create 1.26; k+ 3.4; na++ 146; ca 8.4 phos 2.2; mag 1.9; pre-albumin 10.1 09-02-18: glucose 114; bun 23; creat 1.07; k+ 3.4; na++ 143; ca 8.1; liver normal albumin 2.4 phos 2.5 mag 1.8 09-05-18: wbc 11.6; hgb 11.9; hct 37.9; mcv 90.7 plt 303; glucose 152; bun  11; creat 1.08; k+ 3.5; na++ 138; ca 9.3 liver normal albumin 4.1  09-08-18: wbc 10.9; hgb 8.7; hct 91.7; plt 225; glucose 165; bun 15; creat 1.11; k+ 3.2; na++ 140; ca 8.1 mag 1.8  09-09-18: wbc 6.1; hgb 8.9; hct 29.0; mcv 92.4 plt 254 glucose 149; bun 7; creat 0.89; k+ 3.3; na++ 138; ca 8.1 mag 1.6  09-15-18: wbc 5.7; hgb 9.8; hct 31.6; mcv 92.1 plt 327; glucose 148; bun 14; creat 1.00; k+ 4.5; na++ 136; ca 9.3; mag 1.9 09-18-18: wbc 9.2; hgb 12.0; hct 38.2; mcv 89.7 ;plt 356; glucose 214; bun 21; creat 1.21; k+ 4.9; na++ 132; ca 8.6; liver normal albumin 4.0; mag 2.0 09-19-18: wbc 6.2; hgb 10.2; hct 32.3; mcv  90.5; plt 276; glucose 152; bun 25; creat 1.08; k+ 5.1; na++ 135; ca 9.4; liver normal albumin 3.5; urine culture: e-coli/enterococcus faecalis 09-25-18: wbc 9.5; hgb 12.0; hct 36.9; mcv 88.5; plt 273; glucose 216; bun 22; creat 1.02; k+ 3.2; na++ 133; ca 9.6; liver normal albumin 4.0  09-29-18: wbc 4.9; hgb 9.3; hct 29.9; mcv 92.0 plt 200; glucose 121; bun 81; creat 0.97; k+ 4.1; na++ 137; ca 9.3 10-13-28: wbc 5.4; hgb 9.7; hct 31.0; mcv 90.9; plt 213   NO NEW LABS.    Review of Systems  Reason unable to perform ROS: intellectually challenged; nonverbal     Physical Exam Constitutional:      General: He is not in acute distress.    Appearance: He is well-developed. He is not diaphoretic.  Neck:     Musculoskeletal: Neck supple.     Thyroid: No thyromegaly.  Cardiovascular:     Rate and Rhythm: Regular rhythm. Bradycardia present.     Heart sounds: Normal heart sounds.  Pulmonary:     Effort: Pulmonary effort is normal. No respiratory distress.     Breath sounds: Normal breath sounds.  Abdominal:     General: There is distension.     Palpations: Abdomen is soft.     Tenderness: There is no abdominal tenderness.     Comments: Abdomen is distended soft bowel sounds present  Tympanic to percussion   Genitourinary:    Comments: Foley  Musculoskeletal: Normal range of motion.     Right  lower leg: No edema.     Left lower leg: No edema.  Lymphadenopathy:     Cervical: No cervical adenopathy.  Skin:    General: Skin is warm and dry.  Neurological:     Mental Status: He is alert. Mental status is at baseline.  Psychiatric:        Mood and Affect: Mood normal.     ASSESSMENT/ PLAN:  TODAY:   1. Volvulus 2. Small bowel obstruction  He still has trapped gas present Will insert rectal tube for 6 hours Will begin miralax twice daily  Will repeat kub.    MD is aware of resident's narcotic use and is in agreement with current plan of care. We will attempt to wean resident as apropriate   Synthia Innocenteborah Breyonna Nault NP [facility]  Contact [phone] Monday through Friday 8am- 5pm  After hours call [phone]

## 2018-10-22 ENCOUNTER — Encounter: Payer: Self-pay | Admitting: Adult Health

## 2018-10-22 ENCOUNTER — Non-Acute Institutional Stay (SKILLED_NURSING_FACILITY): Payer: Medicare Other | Admitting: Adult Health

## 2018-10-22 DIAGNOSIS — K810 Acute cholecystitis: Secondary | ICD-10-CM

## 2018-10-22 DIAGNOSIS — K559 Vascular disorder of intestine, unspecified: Secondary | ICD-10-CM

## 2018-10-22 DIAGNOSIS — K56609 Unspecified intestinal obstruction, unspecified as to partial versus complete obstruction: Secondary | ICD-10-CM

## 2018-10-22 NOTE — Progress Notes (Signed)
Location:   [facility] Room Number: 127 D Place of Service:  SNF (31)   CODE STATUS: Full Code  No Known Allergies  Chief Complaint  Patient presents with  . Acute Visit    Care Plan meeting    HPI:  We have come together for his care plan meeting. He does have family present via phone. His goal is to return home. He does have family support. He has been treated this past week with a rectal to relieve colonic ileus; his bowels are now moving. There are no reports of n/v; no reports of pain. He did have a BM yesterday. He has had his bili drain changed.    Past Medical History:  Diagnosis Date  . Diabetes mellitus without complication (HCC)   . History of gout   . History of kidney stones   . Hypertension   . Mentally challenged   . Urinary retention     Past Surgical History:  Procedure Laterality Date  . CYSTOSCOPY W/ URETERAL STENT PLACEMENT Bilateral 06/20/2016   Procedure: CYSTOSCOPY WITH RETROGRADE PYELOGRAM/URETERAL STENT PLACEMENT;  Surgeon: Malen GauzePatrick L McKenzie, MD;  Location: [facility];  Service: Urology;  Laterality: Bilateral;  . CYSTOSCOPY WITH RETROGRADE PYELOGRAM, URETEROSCOPY AND STENT PLACEMENT Bilateral 08/20/2016   Procedure: CYSTOSCOPY WITH BILATERAL RETROGRADE PYELOGRAM, BILATERAL URETEROSCOPY,  LASER LITHOTRIPSY OF RIGHT URETERAL CALCULI, STONE BASKET EXTRACTION LEFT URETERAL CALCULI  AND BILATERAL URETERAL STENT EXCHANGE;  Surgeon: Malen GauzeMcKenzie, Patrick L, MD;  Location: [facility];  Service: Urology;  Laterality: Bilateral;  . HOLMIUM LASER APPLICATION Bilateral 08/20/2016   Procedure: HOLMIUM LASER APPLICATION;  Surgeon: Malen GauzeMcKenzie, Patrick L, MD;  Location: [facility];  Service: Urology;  Laterality: Bilateral;  . IR CATHETER TUBE CHANGE  07/04/2018  . IR EXCHANGE BILIARY DRAIN  06/09/2018  . IR EXCHANGE BILIARY DRAIN  10/20/2018  . IR PERC CHOLECYSTOSTOMY  05/24/2018  . LAPAROTOMY N/A 08/25/2018   Procedure: EXPLORATORY LAPAROTOMY reduction of  volvulus;  Surgeon: Lucretia RoersBridges, Lindsay C, MD;  Location: [facility];  Service: General;  Laterality: N/A;    Social History   Socioeconomic History  . Marital status: Single    Spouse name: Not on file  . Number of children: Not on file  . Years of education: Not on file  . Highest education level: Not on file  Occupational History  . Not on file  Social Needs  . Financial resource strain: Not on file  . Food insecurity    Worry: Not on file    Inability: Not on file  . Transportation needs    Medical: Not on file    Non-medical: Not on file  Tobacco Use  . Smoking status: Never Smoker  . Smokeless tobacco: Never Used  Substance and Sexual Activity  . Alcohol use: No  . Drug use: No  . Sexual activity: Never    Birth control/protection: None  Lifestyle  . Physical activity    Days per week: Not on file    Minutes per session: Not on file  . Stress: Not on file  Relationships  . Social Musicianconnections    Talks on phone: Not on file    Gets together: Not on file    Attends religious service: Not on file    Active member of club or organization: Not on file    Attends meetings of clubs or organizations: Not on file    Relationship status: Not on file  . Intimate partner violence    Fear  of current or ex partner: Not on file    Emotionally abused: Not on file    Physically abused: Not on file    Forced sexual activity: Not on file  Other Topics Concern  . Not on file  Social History Narrative   Patient is delayed lives with family   Family History  Problem Relation Age of Onset  . Gallbladder disease Other       VITAL SIGNS BP 119/61   Pulse 88   Temp 97.6 F (36.4 C)   Resp 20   Ht 5\' 7"  (1.702 m)   Wt 138 lb 3.2 oz (62.7 kg)   BMI 21.65 kg/m   Outpatient Encounter Medications as of 10/22/2018  Medication Sig  . Acetaminophen (TYLENOL ARTHRITIS PAIN PO) Take 1 tablet by mouth every 6 (six) hours as needed (pain).  Marland Kitchen. aspirin EC 81 MG tablet Take 81 mg by mouth  every morning.   Lucilla Lame. Balsam [place]-Castor Oil (VENELEX) OINT Apply topically to sacrum and bilateral buttocks every shift and as needed for blanchable erythema and prevention  . finasteride (PROSCAR) 5 MG tablet Take 5 mg by mouth daily.  . magnesium oxide (MAG-OX) 400 MG tablet Take 400 mg by mouth daily.  . metFORMIN (GLUCOPHAGE) 500 MG tablet Take 1,000 mg by mouth every morning.  . NON FORMULARY Diet Type:  Soft Diet, NAS, Cons CHO  . Nutritional Supplements (ENSURE ENLIVE PO) Take 1 Bottle by mouth 3 (three) times daily.  . phosphorus (K PHOS NEUTRAL) [id number] MG tablet Take 1 tablet (250 mg total) by mouth daily.  . polyethylene glycol (MIRALAX / GLYCOLAX) 17 g packet Take 17 g by mouth daily.  . Prucalopride Succinate 2 MG TABS Take 1 tablet by mouth daily.  . simethicone (MYLICON) 40 MG/0.6ML drops Take 0.6 mLs (40 mg total) by mouth 4 (four) times daily.  . sodium chloride 0.9 % injection Inject 10 mLs into the vein every other day.  . tamsulosin (FLOMAX) 0.4 MG CAPS capsule Take 1 capsule (0.4 mg total) by mouth every evening.   No facility-administered encounter medications on file as of 10/22/2018.      SIGNIFICANT DIAGNOSTIC EXAMS  PREVIOUS;   08-25-18: ct of abdomen and pelvis:  1. High-grade small bowel obstruction with pneumatosis of the mesenteric vessels concerning for bowel ischemia. Clinical correlation and surgical consult is advised. 2. Distended stomach with ingested content in the distal esophagus. 3. Multiple nonobstructing left renal calculi. A partially obstructing stone or string of adjacent stones in the left UPJ with associated mild left hydronephrosis. Multiple bladder calculi. 4. Percutaneous cholecystostomy tube with decompression of the Gallbladder.  09-01-18: DG small bowel w single: Diffuse small bowel dilatation is noted. The contrast administered through nasogastric tube with seen to pool within the stomach and not advance on the initial images. Delayed  images demonstrate no contrast present in the large or small bowel. These findings are consistent with either ileus or distal small bowel obstruction.  09-05-18: ct of abdomen an pelvis:  1. Massive dilatation of the small bowel, without definite transition point. Small volume of gas and stool in a relatively decompressed colon. Findings are favored to reflect small bowel ileus, which has been evident on numerous prior abdominal studies. The possibility of partial or early small-bowel obstruction is not entirely excluded, but not strongly favored at this time. 2. Small amount of gas non dependently in the urinary bladder, favored to be iatrogenic. In the absence of recent catheterization, correlation with urinalysis  would be strongly recommended to exclude the possibility of urinary tract infection with gas-forming organisms. 3. Multiple nonobstructive calculi within the left renal collecting system and lying dependently in the lumen of the urinary bladder. No ureteral stones are noted at this time. Despite this, there is moderate right and severe left hydroureteronephrosis, which could be indicative of distal ureteral strictures, and is presumably exacerbated in the setting of a markedly distended urinary bladder. Urologic consultation is recommended. 4. Moderate right and small left pleural effusions lying dependently with some associated passive subsegmental atelectasis throughout the lower lobes of the lungs bilaterally. 5. Patulous fluid-filled esophagus which demonstrates mural thickening and hyper enhancement, which may simply reflect esophagitis, however, there is any clinical concern for Barrett'  metaplasia or esophageal neoplasia, further evaluation with endoscopy could be considered. 6. Additional incidental findings, as above.  4-20: ct of abdomen:  1. Diffusely dilated and air-filled loops of small bowel similar to prior CT and may represent an ileus. A degree of small-bowel obstruction  is not entirely excluded. Small-bowel series may provide better evaluation. Clinical correlation and follow-up recommended. 2. No free air or pneumatosis.  No portal venous gas. 3. Second frontal thickening of the distal esophagus may represent esophagitis although infiltrative mass is not excluded. Clinical correlation is recommended. 4. Percutaneous cholecystostomy with contracted gallbladder. 5. Nonobstructing left renal calculi. No hydronephrosis.  09-18-18: small bowel series: Significantly dilated small bowel loops though no evidence of bowel obstruction is identified. Cholelithiasis.  09-26-18: ct of abdomen and pelvis:  1. Substantially limited in areas due to streak artifact from retained barium in the large bowel. 2. Massively dilated air and fluid containing small bowel loops which gradually taper to the colon. The barium contrast administered on 09/18/2018 has reached the rectum, indicating partial if any mechanical obstruction. As on the recent small bowel follow-through radiographs, severe ileus/dysmotility seems more likely. 3. No free air.  No new abnormality identified. 4. Severe bladder wall thickening with Foley catheter in place. Left renal staghorn calculi. Cholecystostomy/subhepatic drain.  10-20-18: kub;  Changes consistent with colonic ileus. Correlation with the physical exam is recommended.   TODAY:   10-21-18: kub: 1. Stable widespread gas distended bowel loops as described on the CT 09/26/2018. 2. Bulky left nephrolithiasis. 3. Stable cholecystostomy tube.    LABS REVIEWED PREVIOUS;  08-25-18: wbc 11.4; hgb 15.3; hct 49.4; mcv 93.0; plt 243; glucose 164; bun 30; creat 1.16; k+ 4.2; na++ 136; ca 8.6; liver normal albumin 4.4  08-28-18: wbc 11.7; hgb 13.0; hct 40.9; mcv 91.1; plt 220; glucose 122; bun 34; creat 1.79; k+ 3.7; na++ 144; ca 9.0 phos 3.3 mag 2.0  08-31-18: glucose 121; bun 28; create 1.26; k+ 3.4; na++ 146; ca 8.4 phos 2.2; mag 1.9; pre-albumin 10.1  09-02-18: glucose 114; bun 23; creat 1.07; k+ 3.4; na++ 143; ca 8.1; liver normal albumin 2.4 phos 2.5 mag 1.8 09-05-18: wbc 11.6; hgb 11.9; hct 37.9; mcv 90.7 plt 303; glucose 152; bun 11; creat 1.08; k+ 3.5; na++ 138; ca 9.3 liver normal albumin 4.1  09-08-18: wbc 10.9; hgb 8.7; hct 91.7; plt 225; glucose 165; bun 15; creat 1.11; k+ 3.2; na++ 140; ca 8.1 mag 1.8  09-09-18: wbc 6.1; hgb 8.9; hct 29.0; mcv 92.4 plt 254 glucose 149; bun 7; creat 0.89; k+ 3.3; na++ 138; ca 8.1 mag 1.6  09-15-18: wbc 5.7; hgb 9.8; hct 31.6; mcv 92.1 plt 327; glucose 148; bun 14; creat 1.00; k+ 4.5; na++ 136; ca 9.3; mag 1.9 09-18-18: wbc  9.2; hgb 12.0; hct 38.2; mcv 89.7 ;plt 356; glucose 214; bun 21; creat 1.21; k+ 4.9; na++ 132; ca 8.6; liver normal albumin 4.0; mag 2.0 09-19-18: wbc 6.2; hgb 10.2; hct 32.3; mcv 90.5; plt 276; glucose 152; bun 25; creat 1.08; k+ 5.1; na++ 135; ca 9.4; liver normal albumin 3.5; urine culture: e-coli/enterococcus faecalis 09-25-18: wbc 9.5; hgb 12.0; hct 36.9; mcv 88.5; plt 273; glucose 216; bun 22; creat 1.02; k+ 3.2; na++ 133; ca 9.6; liver normal albumin 4.0  09-29-18: wbc 4.9; hgb 9.3; hct 29.9; mcv 92.0 plt 200; glucose 121; bun 81; creat 0.97; k+ 4.1; na++ 137; ca 9.3 10-13-28: wbc 5.4; hgb 9.7; hct 31.0; mcv 90.9; plt 213   NO NEW LABS.    Review of Systems  Reason unable to perform ROS: intellectually challenged; nonverbal      Physical Exam Constitutional:      General: He is not in acute distress.    Appearance: He is well-developed. He is not diaphoretic.  Neck:     Musculoskeletal: Neck supple.     Thyroid: No thyromegaly.  Cardiovascular:     Rate and Rhythm: Normal rate and regular rhythm.     Pulses: Normal pulses.     Heart sounds: Normal heart sounds.  Pulmonary:     Effort: Pulmonary effort is normal. No respiratory distress.     Breath sounds: Normal breath sounds.  Abdominal:     General: Bowel sounds are normal. There is distension.     Palpations: Abdomen is  soft.     Tenderness: There is no abdominal tenderness.     Comments: Abdomen is soft  Has bili drain   Genitourinary:    Comments: Foley  Musculoskeletal: Normal range of motion.     Right lower leg: No edema.     Left lower leg: No edema.  Lymphadenopathy:     Cervical: No cervical adenopathy.  Skin:    General: Skin is warm and dry.  Neurological:     Mental Status: He is alert. Mental status is at baseline.  Psychiatric:        Mood and Affect: Mood normal.     ASSESSMENT/ PLAN:  TODAY:   1. Small bowel ischemia 2. SBO (small bowel obstruction) 3. Gangrenous cholecystitis   He will need a glucometer upon discharge Therapy is complete at this time His goal remains to return back home.     MD is aware of resident's narcotic use and is in agreement with current plan of care. We will attempt to wean resident as apropriate   Ok Edwards NP [facility]  Contact [phone] Monday through Friday 8am- 5pm  After hours call [phone]

## 2018-10-23 ENCOUNTER — Non-Acute Institutional Stay (SKILLED_NURSING_FACILITY): Payer: Medicare Other | Admitting: Adult Health

## 2018-10-23 ENCOUNTER — Encounter: Payer: Self-pay | Admitting: Adult Health

## 2018-10-23 ENCOUNTER — Other Ambulatory Visit: Payer: Self-pay | Admitting: Adult Health

## 2018-10-23 DIAGNOSIS — K559 Vascular disorder of intestine, unspecified: Secondary | ICD-10-CM | POA: Diagnosis not present

## 2018-10-23 DIAGNOSIS — K562 Volvulus: Secondary | ICD-10-CM | POA: Diagnosis not present

## 2018-10-23 DIAGNOSIS — I10 Essential (primary) hypertension: Secondary | ICD-10-CM

## 2018-10-23 MED ORDER — PRUCALOPRIDE SUCCINATE 2 MG PO TABS: 1.0000 | ORAL_TABLET | Freq: Every day | ORAL | 0 refills | Status: AC

## 2018-10-23 MED ORDER — METFORMIN HCL 500 MG PO TABS: 1000.0000 mg | ORAL_TABLET | Freq: Every morning | ORAL | 0 refills | Status: DC

## 2018-10-23 MED ORDER — K PHOS MONO-SOD PHOS DI & MONO 155-852-130 MG PO TABS: 250.0000 mg | ORAL_TABLET | Freq: Every day | ORAL | 0 refills | Status: DC

## 2018-10-23 MED ORDER — SIMETHICONE 40 MG/0.6ML PO SUSP: 40.0000 mg | Freq: Four times a day (QID) | ORAL | 1 refills | Status: DC

## 2018-10-23 MED ORDER — MAGNESIUM OXIDE 400 MG PO TABS: 400.0000 mg | ORAL_TABLET | Freq: Every day | ORAL | 0 refills | Status: AC

## 2018-10-23 MED ORDER — TAMSULOSIN HCL 0.4 MG PO CAPS: 0.4000 mg | ORAL_CAPSULE | Freq: Every evening | ORAL | 0 refills | Status: DC

## 2018-10-23 MED ORDER — FINASTERIDE 5 MG PO TABS: 5.0000 mg | ORAL_TABLET | Freq: Every day | ORAL | 0 refills | Status: DC

## 2018-10-23 NOTE — Progress Notes (Signed)
Location:   [facility] Room Number: 127 D Place of Service:  SNF (31)    CODE STATUS: Full Code  No Known Allergies  Chief Complaint  Patient presents with   Discharge Note    Discharging to home on 10/24/2018    HPI:  He is being discharged to home with home health for pt/ot/rn/cna. He will not need any dme. He will need his prescriptions written and will need to follow up with his medical provider. He will need glucometer and supplies  He had been hospitalized for a vulvulus; sbo; had an exploratory lap; has a bili drain. He has a foley which will be removed upon discharge; his family prefers to I/O cath every 6 hours as needed. He was admitted to this facility for short term rehab. He has had difficulty with gas build up; and colonic ileus. He has completed therapy on an snf level and is now ready to return back home.     Past Medical History:  Diagnosis Date   Diabetes mellitus without complication (HCC)    History of gout    History of kidney stones    Hypertension    Mentally challenged    Urinary retention     Past Surgical History:  Procedure Laterality Date   CYSTOSCOPY W/ URETERAL STENT PLACEMENT Bilateral 06/20/2016   Procedure: CYSTOSCOPY WITH RETROGRADE PYELOGRAM/URETERAL STENT PLACEMENT;  Surgeon: Malen GauzePatrick L McKenzie, MD;  Location: [facility];  Service: Urology;  Laterality: Bilateral;   CYSTOSCOPY WITH RETROGRADE PYELOGRAM, URETEROSCOPY AND STENT PLACEMENT Bilateral 08/20/2016   Procedure: CYSTOSCOPY WITH BILATERAL RETROGRADE PYELOGRAM, BILATERAL URETEROSCOPY,  LASER LITHOTRIPSY OF RIGHT URETERAL CALCULI, STONE BASKET EXTRACTION LEFT URETERAL CALCULI  AND BILATERAL URETERAL STENT EXCHANGE;  Surgeon: Malen GauzeMcKenzie, Patrick L, MD;  Location: [facility];  Service: Urology;  Laterality: Bilateral;   HOLMIUM LASER APPLICATION Bilateral 08/20/2016   Procedure: HOLMIUM LASER APPLICATION;  Surgeon: Malen GauzeMcKenzie, Patrick L, MD;  Location: [facility];   Service: Urology;  Laterality: Bilateral;   IR CATHETER TUBE CHANGE  07/04/2018   IR EXCHANGE BILIARY DRAIN  06/09/2018   IR EXCHANGE BILIARY DRAIN  10/20/2018   IR PERC CHOLECYSTOSTOMY  05/24/2018   LAPAROTOMY N/A 08/25/2018   Procedure: EXPLORATORY LAPAROTOMY reduction of volvulus;  Surgeon: Lucretia RoersBridges, Lindsay C, MD;  Location: [facility];  Service: General;  Laterality: N/A;    Social History   Socioeconomic History   Marital status: Single    Spouse name: Not on file   Number of children: Not on file   Years of education: Not on file   Highest education level: Not on file  Occupational History   Not on file  Social Needs   Financial resource strain: Not on file   Food insecurity    Worry: Not on file    Inability: Not on file   Transportation needs    Medical: Not on file    Non-medical: Not on file  Tobacco Use   Smoking status: Never Smoker   Smokeless tobacco: Never Used  Substance and Sexual Activity   Alcohol use: No   Drug use: No   Sexual activity: Never    Birth control/protection: None  Lifestyle   Physical activity    Days per week: Not on file    Minutes per session: Not on file   Stress: Not on file  Relationships   Social connections    Talks on phone: Not on file    Gets together: Not on file  Attends religious service: Not on file    Active member of club or organization: Not on file    Attends meetings of clubs or organizations: Not on file    Relationship status: Not on file   Intimate partner violence    Fear of current or ex partner: Not on file    Emotionally abused: Not on file    Physically abused: Not on file    Forced sexual activity: Not on file  Other Topics Concern   Not on file  Social History Narrative   Patient is delayed lives with family   Family History  Problem Relation Age of Onset   Gallbladder disease Other     VITAL SIGNS BP 111/61    Pulse 82    Temp (!) 97.1 F (36.2 C)    Resp 20    Ht 5\' 7"   (1.702 m)    Wt 138 lb 3.2 oz (62.7 kg)    BMI 21.65 kg/m   Patient's Medications  New Prescriptions   No medications on file  Previous Medications   ACETAMINOPHEN (TYLENOL ARTHRITIS PAIN PO)    Take 1 tablet by mouth every 6 (six) hours as needed (pain).   ASPIRIN EC 81 MG TABLET    Take 81 mg by mouth every morning.    BALSAM PERU-CASTOR OIL (VENELEX) OINT    Apply topically to sacrum and bilateral buttocks every shift and as needed for blanchable erythema and prevention   FINASTERIDE (PROSCAR) 5 MG TABLET    Take 5 mg by mouth daily.   MAGNESIUM OXIDE (MAG-OX) 400 MG TABLET    Take 400 mg by mouth daily.   METFORMIN (GLUCOPHAGE) 500 MG TABLET    Take 1,000 mg by mouth every morning.   NON FORMULARY    Diet Type:  Soft Diet, NAS, Cons CHO   NUTRITIONAL SUPPLEMENTS (ENSURE ENLIVE PO)    Take 1 Bottle by mouth 3 (three) times daily.   PHOSPHORUS (K PHOS NEUTRAL) 155-852-130 MG TABLET    Take 1 tablet (250 mg total) by mouth daily.   POLYETHYLENE GLYCOL (MIRALAX / GLYCOLAX) 17 G PACKET    Take 17 g by mouth 2 (two) times daily.    PRUCALOPRIDE SUCCINATE 2 MG TABS    Take 1 tablet by mouth daily.   SIMETHICONE (MYLICON) 40 MG/0.6ML DROPS    Take 0.6 mLs (40 mg total) by mouth 4 (four) times daily.   TAMSULOSIN (FLOMAX) 0.4 MG CAPS CAPSULE    Take 1 capsule (0.4 mg total) by mouth every evening.  Modified Medications   No medications on file  Discontinued Medications   SODIUM CHLORIDE 0.9 % INJECTION    Inject 10 mLs into the vein every other day.     SIGNIFICANT DIAGNOSTIC EXAMS   PREVIOUS;   08-25-18: ct of abdomen and pelvis:  1. High-grade small bowel obstruction with pneumatosis of the mesenteric vessels concerning for bowel ischemia. Clinical correlation and surgical consult is advised. 2. Distended stomach with ingested content in the distal esophagus. 3. Multiple nonobstructing left renal calculi. A partially obstructing stone or string of adjacent stones in the left UPJ with  associated mild left hydronephrosis. Multiple bladder calculi. 4. Percutaneous cholecystostomy tube with decompression of the Gallbladder.  09-01-18: DG small bowel w single: Diffuse small bowel dilatation is noted. The contrast administered through nasogastric tube with seen to pool within the stomach and not advance on the initial images. Delayed images demonstrate no contrast present in the large or  small bowel. These findings are consistent with either ileus or distal small bowel obstruction.  09-05-18: ct of abdomen an pelvis:  1. Massive dilatation of the small bowel, without definite transition point. Small volume of gas and stool in a relatively decompressed colon. Findings are favored to reflect small bowel ileus, which has been evident on numerous prior abdominal studies. The possibility of partial or early small-bowel obstruction is not entirely excluded, but not strongly favored at this time. 2. Small amount of gas non dependently in the urinary bladder, favored to be iatrogenic. In the absence of recent catheterization, correlation with urinalysis would be strongly recommended to exclude the possibility of urinary tract infection with gas-forming organisms. 3. Multiple nonobstructive calculi within the left renal collecting system and lying dependently in the lumen of the urinary bladder. No ureteral stones are noted at this time. Despite this, there is moderate right and severe left hydroureteronephrosis, which could be indicative of distal ureteral strictures, and is presumably exacerbated in the setting of a markedly distended urinary bladder. Urologic consultation is recommended. 4. Moderate right and small left pleural effusions lying dependently with some associated passive subsegmental atelectasis throughout the lower lobes of the lungs bilaterally. 5. Patulous fluid-filled esophagus which demonstrates mural thickening and hyper enhancement, which may simply reflect esophagitis,  however, there is any clinical concern for Barrett'  metaplasia or esophageal neoplasia, further evaluation with endoscopy could be considered. 6. Additional incidental findings, as above.  4-20: ct of abdomen:  1. Diffusely dilated and air-filled loops of small bowel similar to prior CT and may represent an ileus. A degree of small-bowel obstruction is not entirely excluded. Small-bowel series may provide better evaluation. Clinical correlation and follow-up recommended. 2. No free air or pneumatosis.  No portal venous gas. 3. Second frontal thickening of the distal esophagus may represent esophagitis although infiltrative mass is not excluded. Clinical correlation is recommended. 4. Percutaneous cholecystostomy with contracted gallbladder. 5. Nonobstructing left renal calculi. No hydronephrosis.  09-18-18: small bowel series: Significantly dilated small bowel loops though no evidence of bowel obstruction is identified. Cholelithiasis.  09-26-18: ct of abdomen and pelvis:  1. Substantially limited in areas due to streak artifact from retained barium in the large bowel. 2. Massively dilated air and fluid containing small bowel loops which gradually taper to the colon. The barium contrast administered on 09/18/2018 has reached the rectum, indicating partial if any mechanical obstruction. As on the recent small bowel follow-through radiographs, severe ileus/dysmotility seems more likely. 3. No free air.  No new abnormality identified. 4. Severe bladder wall thickening with Foley catheter in place. Left renal staghorn calculi. Cholecystostomy/subhepatic drain.  10-20-18: kub;  Changes consistent with colonic ileus. Correlation with the physical exam is recommended.   10-21-18: kub: 1. Stable widespread gas distended bowel loops as described on the CT 09/26/2018. 2. Bulky left nephrolithiasis. 3. Stable cholecystostomy tube.   NO NEW EXAMS.   LABS REVIEWED PREVIOUS;  08-25-18: wbc 11.4; hgb  15.3; hct 49.4; mcv 93.0; plt 243; glucose 164; bun 30; creat 1.16; k+ 4.2; na++ 136; ca 8.6; liver normal albumin 4.4  08-28-18: wbc 11.7; hgb 13.0; hct 40.9; mcv 91.1; plt 220; glucose 122; bun 34; creat 1.79; k+ 3.7; na++ 144; ca 9.0 phos 3.3 mag 2.0  08-31-18: glucose 121; bun 28; create 1.26; k+ 3.4; na++ 146; ca 8.4 phos 2.2; mag 1.9; pre-albumin 10.1 09-02-18: glucose 114; bun 23; creat 1.07; k+ 3.4; na++ 143; ca 8.1; liver normal albumin 2.4 phos 2.5 mag 1.8  09-05-18: wbc 11.6; hgb 11.9; hct 37.9; mcv 90.7 plt 303; glucose 152; bun 11; creat 1.08; k+ 3.5; na++ 138; ca 9.3 liver normal albumin 4.1  09-08-18: wbc 10.9; hgb 8.7; hct 91.7; plt 225; glucose 165; bun 15; creat 1.11; k+ 3.2; na++ 140; ca 8.1 mag 1.8  09-09-18: wbc 6.1; hgb 8.9; hct 29.0; mcv 92.4 plt 254 glucose 149; bun 7; creat 0.89; k+ 3.3; na++ 138; ca 8.1 mag 1.6  09-15-18: wbc 5.7; hgb 9.8; hct 31.6; mcv 92.1 plt 327; glucose 148; bun 14; creat 1.00; k+ 4.5; na++ 136; ca 9.3; mag 1.9 09-18-18: wbc 9.2; hgb 12.0; hct 38.2; mcv 89.7 ;plt 356; glucose 214; bun 21; creat 1.21; k+ 4.9; na++ 132; ca 8.6; liver normal albumin 4.0; mag 2.0 09-19-18: wbc 6.2; hgb 10.2; hct 32.3; mcv 90.5; plt 276; glucose 152; bun 25; creat 1.08; k+ 5.1; na++ 135; ca 9.4; liver normal albumin 3.5; urine culture: e-coli/enterococcus faecalis 09-25-18: wbc 9.5; hgb 12.0; hct 36.9; mcv 88.5; plt 273; glucose 216; bun 22; creat 1.02; k+ 3.2; na++ 133; ca 9.6; liver normal albumin 4.0  09-29-18: wbc 4.9; hgb 9.3; hct 29.9; mcv 92.0 plt 200; glucose 121; bun 81; creat 0.97; k+ 4.1; na++ 137; ca 9.3 10-13-28: wbc 5.4; hgb 9.7; hct 31.0; mcv 90.9; plt 213   NO NEW LABS.    Review of Systems  Reason unable to perform ROS: intellectually challenged nonverbal     Physical Exam Constitutional:      General: He is not in acute distress.    Appearance: He is well-developed. He is not diaphoretic.  Neck:     Musculoskeletal: Neck supple.     Thyroid: No thyromegaly.    Cardiovascular:     Rate and Rhythm: Normal rate and regular rhythm.     Pulses: Normal pulses.     Heart sounds: Normal heart sounds.  Pulmonary:     Effort: Pulmonary effort is normal. No respiratory distress.     Breath sounds: Normal breath sounds.  Abdominal:     General: Bowel sounds are normal. There is distension.     Palpations: Abdomen is soft.     Tenderness: There is no abdominal tenderness.     Comments: Bili drain  Genitourinary:    Comments: Foley: will remove prior to discharge  Musculoskeletal: Normal range of motion.     Right lower leg: No edema.     Left lower leg: No edema.  Lymphadenopathy:     Cervical: No cervical adenopathy.  Skin:    General: Skin is warm and dry.  Neurological:     Mental Status: He is alert. Mental status is at baseline.  Psychiatric:        Mood and Affect: Mood normal.         ASSESSMENT/ PLAN:   Patient is being discharged with the following home health services:  Pt/ot/rn/cna: to evaluate and treat as indicated for gait balance strength adl training medication management and adl care.   Patient is being discharged with the following durable medical equipment:  Glucometer with supplies   Patient has been advised to f/u with their PCP in 1-2 weeks to bring them up to date on their rehab stay.  Social services at facility was responsible for arranging this appointment.  Pt was provided with a 30 day supply of prescriptions for medications and refills must be obtained from their PCP.  For controlled substances, a more limited supply may be provided adequate until PCP appointment  only.   A 30 day supply of his prescription medications have been sent to [facility] on [address]  Time spent with patient: 35 minutes: for medications; dme and home health needs.    Ok Edwards NP [facility]  Contact [phone] Monday through Friday 8am- 5pm  After hours call [phone]

## 2018-10-28 ENCOUNTER — Ambulatory Visit: Payer: Medicare Other | Admitting: General Surgery

## 2018-11-04 ENCOUNTER — Ambulatory Visit (INDEPENDENT_AMBULATORY_CARE_PROVIDER_SITE_OTHER): Payer: Medicare Other | Admitting: General Surgery

## 2018-11-04 ENCOUNTER — Other Ambulatory Visit: Payer: Self-pay

## 2018-11-04 ENCOUNTER — Encounter: Payer: Self-pay | Admitting: General Surgery

## 2018-11-04 VITALS — BP 104/68 | HR 68 | Temp 96.8°F | Resp 16 | Ht 67.0 in | Wt 137.0 lb

## 2018-11-04 DIAGNOSIS — K810 Acute cholecystitis: Secondary | ICD-10-CM

## 2018-11-04 DIAGNOSIS — Z434 Encounter for attention to other artificial openings of digestive tract: Secondary | ICD-10-CM | POA: Diagnosis not present

## 2018-11-04 NOTE — Progress Notes (Signed)
[facility] Note   HPI:  68 y.o. Male presents to clinic for follow-up evaluation of his cholecystostomy tube he has had in since 05/2018 after [facility]. He has subsequently underwent a laparotomy for SBO and volvulus with me 08/2018, and has been in a nursing home since that time. He was just discharged 7/18 per May, his aunt's report.  He has been doing better now at home. 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 Ileus, and despite multiple attempts and orders for patient to get regular bowel regimen and be placed on the commode twice a day, I do not think this always occurs at the facility.  He as started on some Motegrity by Dr. Karilyn Cotaehman with samples and has follow up with him also regarding this issue.  I do not see Motegrity on his meds at this time.   He is now at home. He is ambulating with a walker. He is eating and having BMs regularly. When he does not have a BM [name] and May know what to do to help him have a BM.  He had his cholecystostomy tube checked 7/6 after it became dislodged a little. It was still draining and in place. The cholecystomy tube is in a stone filled gallbladder with an occluded cystic duct. Removing the tube is not an option without cholecystectomy.    Awanda Minklbert has never said much for me but does seem more vocal and with it today, nodding his head and motioning at his tube.   Review of Systems:  No fever or chills Regular Bms Strength and appetite improving  All other review of systems: otherwise negative   Vital Signs:  BP 104/68 (BP Location: Left Arm, Patient Position: Sitting, Cuff Size: Normal)   Pulse 68   Temp (!) 96.8 F (36 C) (Temporal)   Resp 16   Ht 5\' 7"  (1.702 m)   Wt 137 lb (62.1 kg)   SpO2 98%   BMI 21.46 kg/m    Physical Exam:  Physical Exam Vitals signs reviewed.  HENT:     Head:  Normocephalic.  Cardiovascular:     Rate and Rhythm: Normal rate.  Pulmonary:     Effort: Pulmonary effort is normal.  Abdominal:     Palpations: Abdomen is soft.     Tenderness: There is no abdominal tenderness.     Hernia: No hernia is present.     Comments: Slightly distended and lax abdominal wall, normal for patient, cholecystostomy tube in place, draining, site without redness or drainage, midline healed no hernia noted   Musculoskeletal:        General: No swelling.  Skin:    General: Skin is warm and dry.  Neurological:     Mental Status: He is alert. Mental status is at baseline.  Psychiatric:        Mood and Affect: Mood normal.      Assessment:  68 y.o. yo Male with a history of gangrenous cholecystitis s/p cholecystostomy tube 05/2018 and also with SBO from volvulous requiring laparotomy with reduction of the volvulus. He required SNF after this and had multiple readmissions. He appears to have lost weight and sstrenght during this time. I worry about taking him for any surgery at this time. His main POA, Louie CasaGeneva is not here today. May came with him to the appointment.  We have discussed the need for the cholecystostomy tube to drain the gallbladder  and how this cannot be removed without taking out the gallbladder due to the risk of repeat infections, need for further drains or surgeries emergently.  Ultimately the family needs to decide about a permanent tube versus attempts at surgery which could leave him weak and need SNF again for a few weeks. At this time, I think he needs to be at home and get stronger and gain weight.    Plan:  Family to discuss options of leaving in cholecystostomy tube (tube in the side) to drain the gallbladder versus laparoscopic cholecystectomy (surgery to remove the gallbladder). There is a chance that the surgery would not be laparoscopic (small incisions) and would have to be an open (large cut).  With an open surgery, he may have to go back to the  nursing facility for a few weeks.   If the tube is removed without removing the gallbladder, there is a risk of the gallbladder getting infected and causing problems that would require emergency surgery or another tube or could cause Zackariah to get very sick or cause him pain.  Removing the tube without removing the gallbladder is not a good idea.   -Cholecystostomy tube care per regular routine. Follow up with IR and call IR if having issues with the drain to get it imaged.   Follow up in 5-6 weeks to discuss decision for surgery versus keeping tube and discuss risk /benefits.   [name] and May need to come to the 9/1 appt.   Future Appointments  Date Time Provider [place]  12/16/2018 10:00 AM Virl Cagey, MD RS-RS None  01/12/2019  9:00 AM [facility]-IR 1 [facility]  03/31/2019 10:00 AM Rehman, Mechele Dawley, MD NRE-NRE None    All of the above recommendations were discussed with the patient and patient's family, and all of patient's and family's questions were answered to their expressed satisfaction.  Curlene Labrum, MD [facility] [address] [place],  [address] [phone] (office)

## 2018-11-04 NOTE — Patient Instructions (Addendum)
Discussion:  Family to discuss options of leaving in cholecystostomy tube (tube in the side) to drain the gallbladder versus laparoscopic cholecystectomy (surgery to remove the gallbladder). There is a chance that the surgery would not be laparoscopic (small incisions) and would have to be an open (large cut).  With an open surgery, he may have to go back to the nursing facility for a few weeks.   If the tube is removed without removing the gallbladder, there is a risk of the gallbladder getting infected and causing problems that would require emergency surgery or another tube or could cause [name] to get very sick or cause him pain.  Removing the tube without removing the gallbladder is not a good idea.   If you have problems with your tube, or your tube stops draining. [facility] to get them to do images of the tube to see if it is in the correct place. As long as the tube continues to drain, it is likely in the right place.    Help [name] get stronger and gain some weight back in the next 6 weeks. We will discuss the families decision on a return appointment.   Cholecystostomy, Care After This sheet gives you information about how to care for yourself after your procedure. Your health care provider may also give you more specific instructions. If you have problems or questions, contact your health care provider. What can I expect after the procedure? After your procedure, it is common to have soreness near the incision site of your drainage tube (catheter). Follow these instructions at home: Incision care   Follow instructions from your health care provider about how to take care of your incision site where the catheter was inserted. Make sure you: ? Wash your hands with soap and water before and after you change your bandage (dressing). If soap and water are not available, use hand sanitizer. ? Change your dressing as told by your health care provider.  Check the incision  site every day for signs of infection. Check for: ? Redness, swelling, or pain. ? Fluid or blood. ? Warmth. ? Pus or a bad smell.  Do not take baths, swim, or use a hot tub until your health care provider approves. Ask your health care provider if you may take showers. You may only be allowed to take sponge baths. General instructions  Follow instructions from your health care provider about how to care for your catheter and collection bag at home.  Your health care provider will show you: ? How to record the amount of drainage from the catheter. ? How to flush the catheter. ? How to care for the catheter incision site.  Follow instructions from your health care provider about eating or drinking restrictions.  Take over-the-counter and prescription medicines only as told by your health care provider.  Keep all follow-up visits as told by your health care provider. This is important. Contact a health care provider if:  You have redness, swelling, or pain around the catheter incision site.  You have nausea or vomiting. Get help right away if:  Your abdominal pain gets worse.  You feel dizzy or you faint while standing.  You have fluid or blood coming from the catheter incision site.  The area around the catheter incision site feels warm to the touch.  You have pus or a bad smell coming from the catheter incision site.  You have a fever.  You have shortness of breath.  You have a  rapid heartbeat.  Your nausea or vomiting does not go away.  Your catheter becomes blocked.  Your catheter comes out of your abdomen. Summary  After your procedure, it is common to have soreness near the incision site of your drainage tube (catheter).  Wash your hands with soap and water before and after you change your bandage (dressing). Change your dressing as told by your health care provider.  Check the catheter incision site every day for signs of infection. Check for redness,  swelling, pain, fluid, blood, warmth, pus, or a bad smell.  Contact your health care provider if you have nausea or vomiting, or if you have redness, swelling, or pain around your catheter incision site.  Get help right away if your abdominal pain gets worse, you feel dizzy, you have blood or fluid coming from the catheter incision site, you have a fever, or you have shortness of breath. This information is not intended to replace advice given to you by your health care provider. Make sure you discuss any questions you have with your health care provider. Document Released: 12/22/2014 Document Revised: 10/28/2017 Document Reviewed: 10/28/2017 Elsevier Patient Education  2020 [employer]   Laparoscopic Cholecystectomy Laparoscopic cholecystectomy is surgery to remove the gallbladder. The gallbladder is a pear-shaped organ that lies beneath the liver on the right side of the body. The gallbladder stores bile, which is a fluid that helps the body to digest fats. Cholecystectomy is often done for inflammation of the gallbladder (cholecystitis). This condition is usually caused by a buildup of gallstones (cholelithiasis) in the gallbladder. Gallstones can block the flow of bile, which can result in inflammation and pain. In severe cases, emergency surgery may be required. This procedure is done though small incisions in your abdomen (laparoscopic surgery). A thin scope with a camera (laparoscope) is inserted through one incision. Thin surgical instruments are inserted through the other incisions. In some cases, a laparoscopic procedure may be turned into a type of surgery that is done through a larger incision (open surgery). Tell a health care provider about:  Any allergies you have.  All medicines you are taking, including vitamins, herbs, eye drops, creams, and over-the-counter medicines.  Any problems you or family members have had with anesthetic medicines.  Any blood disorders you  have.  Any surgeries you have had.  Any medical conditions you have.  Whether you are pregnant or may be pregnant. What are the risks? Generally, this is a safe procedure. However, problems may occur, including:  Infection.  Bleeding.  Allergic reactions to medicines.  Damage to other structures or organs.  A stone remaining in the common bile duct. The common bile duct carries bile from the gallbladder into the small intestine.  A bile leak from the cyst duct that is clipped when your gallbladder is removed. Medicines  Ask your health care provider about: ? Changing or stopping your regular medicines. This is especially important if you are taking diabetes medicines or blood thinners. ? Taking medicines such as aspirin and ibuprofen. These medicines can thin your blood. Do not take these medicines before your procedure if your health care provider instructs you not to.  You may be given antibiotic medicine to help prevent infection. General instructions  Let your health care provider know if you develop a cold or an infection before surgery.  Plan to have someone take you home from the hospital or clinic.  Ask your health care provider how your surgical site will be marked or identified.  What happens during the procedure?   To reduce your risk of infection: ? Your health care team will wash or sanitize their hands. ? Your skin will be washed with soap. ? Hair may be removed from the surgical area.  An IV tube may be inserted into one of your veins.  You will be given one or more of the following: ? A medicine to help you relax (sedative). ? A medicine to make you fall asleep (general anesthetic).  A breathing tube will be placed in your mouth.  Your surgeon will make several small cuts (incisions) in your abdomen.  The laparoscope will be inserted through one of the small incisions. The camera on the laparoscope will send images to a TV screen (monitor) in the  operating room. This lets your surgeon see inside your abdomen.  Air-like gas will be pumped into your abdomen. This will expand your abdomen to give the surgeon more room to perform the surgery.  Other tools that are needed for the procedure will be inserted through the other incisions. The gallbladder will be removed through one of the incisions.  Your common bile duct may be examined. If stones are found in the common bile duct, they may be removed.  After your gallbladder has been removed, the incisions will be closed with stitches (sutures), staples, or skin glue.  Your incisions may be covered with a bandage (dressing). The procedure may vary among health care providers and hospitals. What happens after the procedure?  Your blood pressure, heart rate, breathing rate, and blood oxygen level will be monitored until the medicines you were given have worn off.  You will be given medicines as needed to control your pain.  Do not drive for 24 hours if you were given a sedative. This information is not intended to replace advice given to you by your health care provider. Make sure you discuss any questions you have with your health care provider. Document Released: 04/02/2005 Document Revised: 03/15/2017 Document Reviewed: 09/19/2015 Elsevier Patient Education  2020 [employer].   Open Cholecystectomy Open cholecystectomy is surgery to remove the gallbladder. The gallbladder is a pear-shaped organ that lies beneath the liver on the right side of the body. The gallbladder stores bile, which is a fluid that helps the body to digest fats. Cholecystectomy is often done for inflammation of the gallbladder (cholecystitis). This condition is usually caused by a buildup of gallstones (cholelithiasis) in the gallbladder. Gallstones can block the flow of bile, which can result in inflammation and pain. In severe cases, emergency surgery may be required. Tell a health care provider about:  Any  allergies you have.  All medicines you are taking, including vitamins, herbs, eye drops, creams, and over-the-counter medicines.  Any problems you or family members have had with anesthetic medicines.  Any blood disorders you have.  Any surgeries you have had.  Any medical conditions you have.  Whether you are pregnant or may be pregnant. What are the risks? Generally, this is a safe procedure. However, problems may occur, including:  Infection.  Bleeding.  Allergic reactions to medicines.  Damage to other structures or organs.  A stone remaining in the common bile duct. The common bile duct carries bile from the gallbladder into the small intestine.  A bile leak from the cyst duct that is clipped when your gallbladder is removed. What happens before the procedure? Staying hydrated Follow instructions from your health care provider about hydration, which may include:  Up to 2  hours before the procedure - you may continue to drink clear liquids, such as water, clear fruit juice, black coffee, and plain tea. Eating and drinking restrictions Follow instructions from your health care provider about eating and drinking, which may include:  8 hours before the procedure - stop eating heavy meals or foods such as meat, fried foods, or fatty foods.  6 hours before the procedure - stop eating light meals or foods, such as toast or cereal.  6 hours before the procedure - stop drinking milk or drinks that contain milk.  2 hours before the procedure - stop drinking clear liquids. Medicines  Ask your health care provider about: ? Changing or stopping your regular medicines. This is especially important if you are taking diabetes medicines or blood thinners. ? Taking medicines such as aspirin and ibuprofen. These medicines can thin your blood. Do not take these medicines before your procedure if your health care provider instructs you not to.  You may be given antibiotic medicine to  help prevent infection. General instructions  Let your health care provider know if you develop a cold or an infection before surgery.  Plan to have someone take you home from the hospital or clinic.  Ask your health care provider how your surgical site will be marked or identified. What happens during the procedure?   To reduce your risk of infection: ? Your health care team will wash or sanitize their hands. ? Your skin will be washed with soap. ? Hair may be removed from the surgical area.  An IV tube may be inserted into one of your veins.  You will be given one or more of the following: ? A medicine to help you relax (sedative). ? A medicine to make you fall asleep (general anesthetic).  A breathing tube will be placed in your mouth.  Your surgeon will make a cut (incision) in the upper abdomen to access your gallbladder.  Your gallbladder will be removed.  Your common bile duct may be examined. If stones are found in the common bile duct, they may be removed.  After your gallbladder has been removed, the incisions will be closed with stitches (sutures), skin glue, or staples.  Your incision will be covered with a bandage (dressing). The procedure may vary among health care providers and hospitals. What happens after the procedure?  Your blood pressure, heart rate, breathing rate, and blood oxygen level will be monitored until the medicines you were given have worn off.  You will be given medicines as needed to control your pain.  Do not drive for 24 hours if you were given a sedative. This information is not intended to replace advice given to you by your health care provider. Make sure you discuss any questions you have with your health care provider. Document Released: 12/23/2001 Document Revised: 03/15/2017 Document Reviewed: 09/19/2015 Elsevier Patient Education  2020 [employer]

## 2018-12-02 ENCOUNTER — Other Ambulatory Visit: Payer: Self-pay | Admitting: Adult Health

## 2018-12-16 ENCOUNTER — Encounter: Payer: Self-pay | Admitting: General Surgery

## 2018-12-16 ENCOUNTER — Other Ambulatory Visit: Payer: Self-pay

## 2018-12-16 ENCOUNTER — Ambulatory Visit (INDEPENDENT_AMBULATORY_CARE_PROVIDER_SITE_OTHER): Payer: Medicare Other | Admitting: General Surgery

## 2018-12-16 VITALS — BP 113/56 | HR 86 | Temp 96.8°F | Resp 16 | Ht 67.0 in | Wt 147.0 lb

## 2018-12-16 DIAGNOSIS — Z434 Encounter for attention to other artificial openings of digestive tract: Secondary | ICD-10-CM

## 2018-12-16 NOTE — Patient Instructions (Signed)
Continue drain care. If something occurs with the drain, then please call IR in [place], to get this checked out. Continue to go to the drain checks in [place]. The Drain is draining the gallbladder and preventing a gallbladder infection. At this time we are leaving the drain in place and opting to not perform a gallbladder surgery.   Please call with any questions or concerns.  Return to Clinic in December for a check.

## 2018-12-17 NOTE — Progress Notes (Signed)
[facility] Note   HPI:  68 y.o. Male presents to clinic for follow-up evaluation for his cholecystostomy tube that was placed January 2020. He is doing well. He has had an eventful few months after being hospitalized with gangrenous cholecystitis requiring the cholecystostomy tube, then having a volvulus and regarding an Ex lap and reduction of volvulus May 2020. He then has suffered from chronic colonic ileus that predates these above issues and complicated his SNF course with multiple re-hospitalizations due to distention that was benign but significant and scared the providers at the SNF.  He ultimately went home July 2020. I saw him last soon after his discharge, and we discussed the options of cholecystectomy tube to remain in place versus cholecystectomy.  He has had his drain exchanged 10/20/2018, and this demonstrated stones and no filling of the cystic duct.    He has two aunts, and 1 aunt [name] is his POA. [name] is not here today.  She has sent word with the other aunt that she would like to hold on any surgery to remove the cholecystostomy tube or gallbladder at this time. The patient is now just getting back to himself at home. He is eating and drinking. He has been having to get I&O regularly and they did note a bladder stone, but no reported pain. His cholecystostomy tube is draining appropriately.   Review of Systems:  No fever or chills No abdominal pain All other review of systems: otherwise negative   Social History: Lives with aunts, [name] is his POA  Vital Signs:  BP (!) 113/56 (BP Location: Left Arm, Patient Position: Sitting, Cuff Size: Normal)   Pulse 86   Temp (!) 96.8 F (36 C) (Tympanic)   Resp 16   Ht 5\' 7"  (1.702 m)   Wt 147 lb (66.7 kg)   SpO2 97%   BMI 23.02 kg/m    Physical Exam:  Physical Exam Vitals signs reviewed.  HENT:     Head: Normocephalic.  Cardiovascular:     Rate and Rhythm: Normal rate.  Pulmonary:     Effort: Pulmonary  effort is normal.  Abdominal:     General: There is distension.     Palpations: Abdomen is soft.     Tenderness: There is no abdominal tenderness.     Comments: Chronic distention, but soft and less distended than other times, biliary drain in place and draining bile  Musculoskeletal:        General: No swelling.  Skin:    General: Skin is warm and dry.  Neurological:     Mental Status: He is alert.    Assessment:  68 y.o. yo Male with a cholecystostomy tube in place with multiple other issues this past few months that have kept him in the hospital and SNF.  At this time the cholecystostomy tube is working and he is doing well. We will leave this in place and have it studied per IR protocol. We will reassess in the future for the patient's readiness / fitness to have this removed/ cholecystectomy as he is at risk for an open cholecystectomy and prolonged hospitalization and SNF given his multiple other issues.  Plan:  - Diet as tolerated - Activity as tolerated - Biliary drain care, Follow up with IR   Future Appointments  Date Time Provider [place]  12/24/2018 10:45 AM McKenzie, Candee Furbish, MD AUR-AUR None  01/12/2019  9:00 AM [facility] 1 [facility]  03/17/2019 10:00 AM Virl Cagey, MD RS-RS  None  03/31/2019 10:00 AM Rehman, Joline MaxcyNajeeb U, MD NRE-NRE None    All of the above recommendations were discussed with the patient and patient's family, and all of patient's and family's questions were answered to their expressed satisfaction.  Algis GreenhouseLindsay Bridges, MD [facility] [address] [place], [place] [phone] (office)

## 2018-12-24 ENCOUNTER — Ambulatory Visit (INDEPENDENT_AMBULATORY_CARE_PROVIDER_SITE_OTHER): Payer: Medicare Other | Admitting: Urology

## 2018-12-24 DIAGNOSIS — N401 Enlarged prostate with lower urinary tract symptoms: Secondary | ICD-10-CM | POA: Diagnosis not present

## 2018-12-24 DIAGNOSIS — R338 Other retention of urine: Secondary | ICD-10-CM

## 2018-12-25 ENCOUNTER — Emergency Department (HOSPITAL_COMMUNITY)
Admission: EM | Admit: 2018-12-25 | Discharge: 2018-12-25 | Disposition: A | Discharge status: Home / Self Care | Payer: Medicare Other | Attending: Emergency Medicine | Admitting: Emergency Medicine

## 2018-12-25 ENCOUNTER — Other Ambulatory Visit: Payer: Self-pay

## 2018-12-25 ENCOUNTER — Encounter (HOSPITAL_COMMUNITY): Payer: Self-pay | Admitting: Emergency Medicine

## 2018-12-25 ENCOUNTER — Emergency Department (HOSPITAL_COMMUNITY): Payer: Medicare Other

## 2018-12-25 DIAGNOSIS — E119 Type 2 diabetes mellitus without complications: Secondary | ICD-10-CM | POA: Diagnosis not present

## 2018-12-25 DIAGNOSIS — Z7984 Long term (current) use of oral hypoglycemic drugs: Secondary | ICD-10-CM | POA: Diagnosis not present

## 2018-12-25 DIAGNOSIS — Z79899 Other long term (current) drug therapy: Secondary | ICD-10-CM | POA: Insufficient documentation

## 2018-12-25 DIAGNOSIS — I10 Essential (primary) hypertension: Secondary | ICD-10-CM | POA: Diagnosis not present

## 2018-12-25 DIAGNOSIS — N3001 Acute cystitis with hematuria: Secondary | ICD-10-CM | POA: Diagnosis not present

## 2018-12-25 DIAGNOSIS — R1110 Vomiting, unspecified: Secondary | ICD-10-CM | POA: Diagnosis present

## 2018-12-25 LAB — URINALYSIS, ROUTINE W REFLEX MICROSCOPIC
Bilirubin Urine: NEGATIVE
Glucose, UA: 50 mg/dL — AB
Ketones, ur: NEGATIVE mg/dL
Nitrite: NEGATIVE
Protein, ur: 30 mg/dL — AB
Specific Gravity, Urine: 1.014 (ref 1.005–1.030)
WBC, UA: >50 WBC/hpf — ABNORMAL HIGH (ref 0–5)
pH: 6 (ref 5.0–8.0)

## 2018-12-25 LAB — COMPREHENSIVE METABOLIC PANEL WITH GFR
ALT: 10 U/L (ref 0–44)
AST: 10 U/L — ABNORMAL LOW (ref 15–41)
Albumin: 3.4 g/dL — ABNORMAL LOW (ref 3.5–5.0)
Alkaline Phosphatase: 59 U/L (ref 38–126)
Anion gap: 8 (ref 5–15)
BUN: 19 mg/dL (ref 8–23)
CO2: 25 mmol/L (ref 22–32)
Calcium: 9.2 mg/dL (ref 8.9–10.3)
Chloride: 102 mmol/L (ref 98–111)
Creatinine, Ser: 0.86 mg/dL (ref 0.61–1.24)
GFR calc Af Amer: >60 mL/min
GFR calc non Af Amer: >60 mL/min
Glucose, Bld: 189 mg/dL — ABNORMAL HIGH (ref 70–99)
Potassium: 3.8 mmol/L (ref 3.5–5.1)
Sodium: 135 mmol/L (ref 135–145)
Total Bilirubin: 0.1 mg/dL — ABNORMAL LOW (ref 0.3–1.2)
Total Protein: 7.5 g/dL (ref 6.5–8.1)

## 2018-12-25 LAB — CBC
HCT: 36.8 % — ABNORMAL LOW (ref 39.0–52.0)
Hemoglobin: 11 g/dL — ABNORMAL LOW (ref 13.0–17.0)
MCH: 27.2 pg (ref 26.0–34.0)
MCHC: 29.9 g/dL — ABNORMAL LOW (ref 30.0–36.0)
MCV: 91.1 fL (ref 80.0–100.0)
Platelets: 239 10*3/uL (ref 150–400)
RBC: 4.04 MIL/uL — ABNORMAL LOW (ref 4.22–5.81)
RDW: 14.6 % (ref 11.5–15.5)
WBC: 11.2 10*3/uL — ABNORMAL HIGH (ref 4.0–10.5)
nRBC: 0 % (ref 0.0–0.2)

## 2018-12-25 LAB — LIPASE, BLOOD: Lipase: 26 U/L (ref 11–51)

## 2018-12-25 MED ORDER — ONDANSETRON HCL 4 MG/2ML IJ SOLN
4.0000 mg | Freq: Once | INTRAMUSCULAR | Status: AC
  Administered 2018-12-25: 4 mg via INTRAVENOUS
  Filled 2018-12-25: qty 2

## 2018-12-25 MED ORDER — CIPROFLOXACIN HCL 500 MG PO TABS: 500.0000 mg | ORAL_TABLET | Freq: Two times a day (BID) | ORAL | 0 refills | Status: DC

## 2018-12-25 MED ORDER — SODIUM CHLORIDE 0.9 % IV SOLN
1.0000 g | Freq: Once | INTRAVENOUS | Status: AC
  Administered 2018-12-25: 23:00:00 1 g via INTRAVENOUS
  Filled 2018-12-25: qty 10

## 2018-12-25 MED ORDER — ONDANSETRON 4 MG PO TBDP: ORAL_TABLET | ORAL | 0 refills | Status: DC

## 2018-12-25 MED ORDER — SODIUM CHLORIDE 0.9 % IV BOLUS
1000.0000 mL | Freq: Once | INTRAVENOUS | Status: AC
  Administered 2018-12-25: 22:00:00 1000 mL via INTRAVENOUS

## 2018-12-25 MED ORDER — SODIUM CHLORIDE 0.9% FLUSH: 3.0000 mL | Freq: Once | INTRAVENOUS | Status: DC

## 2018-12-25 MED ORDER — IOHEXOL 300 MG/ML  SOLN
100.0000 mL | Freq: Once | INTRAMUSCULAR | Status: AC | PRN
  Administered 2018-12-25: 100 mL via INTRAVENOUS

## 2018-12-25 NOTE — ED Triage Notes (Signed)
Patient started vomiting this am.

## 2018-12-26 NOTE — ED Provider Notes (Signed)
[name] Provider Note   CSN: [id number] Arrival date & time: 12/25/18  1830     History   Chief Complaint Chief Complaint  Patient presents with   Emesis    HPI EMRYS [name] is a 68 y.o. male.     Patient brought in because he threw up twice.  Patient has a history of mental problems  The history is provided by the patient and a relative. No language interpreter was used.  Emesis Severity:  Mild Timing:  Intermittent Quality:  Bilious material Able to tolerate:  Liquids Progression:  Resolved Chronicity:  New Associated symptoms: no abdominal pain, no cough, no diarrhea and no headaches     Past Medical History:  Diagnosis Date   Diabetes mellitus without complication (HCC)    History of gout    History of kidney stones    Hypertension    Mentally challenged    Urinary retention     Patient Active Problem List   Diagnosis Date Noted   Cholecystostomy care (HCC) 11/04/2018   Acute lower UTI 09/18/2018   Abdominal distention 09/18/2018   Hypomagnesemia 09/09/2018   Protein-calorie malnutrition, severe (HCC) 09/09/2018   Acute blood loss anemia 09/09/2018   Gangrenous cholecystitis 09/05/2018   Volvulus (HCC) 09/03/2018   Ileus (HCC)    Pneumatosis of intestines    Gall bladder disease 08/25/2018   Small bowel ischemia (HCC)    Acute gangrenous cholecystitis 05/24/2018   SBO (small bowel obstruction) (HCC) 05/24/2018   Abdominal distension    Pressure injury of skin 04/09/2018   C. difficile diarrhea 04/08/2018   Bilateral hydronephrosis 04/08/2018   Pneumoperitoneum of unknown etiology    Diarrhea 04/07/2018   Nonverbal 04/07/2018   Hydronephrosis with urinary obstruction due to ureteral calculus 06/20/2016   Sepsis secondary to UTI (HCC) 06/20/2016   Fever 06/19/2016   Generalized weakness 06/19/2016   Diabetes mellitus type 2, noninsulin dependent (HCC) 06/19/2016   Severe sepsis with  septic shock (HCC) 06/19/2016   UTI (urinary tract infection) 06/19/2016   Hematuria 06/19/2016   AKI (acute kidney injury) (HCC) 06/19/2016   Hyponatremia 06/19/2016   Hypokalemia 06/19/2016   BPH (benign prostatic hyperplasia) 06/19/2016   Pseudo-obstruction of colon 05/07/2014   Urinary retention 05/07/2014   Intellectual disability 05/07/2014   Hyperlipidemia 05/07/2014   HTN (hypertension) 05/07/2014    Past Surgical History:  Procedure Laterality Date   CYSTOSCOPY W/ URETERAL STENT PLACEMENT Bilateral 06/20/2016   Procedure: CYSTOSCOPY WITH RETROGRADE PYELOGRAM/URETERAL STENT PLACEMENT;  Surgeon: Malen Gauze, MD;  Location: [facility];  Service: Urology;  Laterality: Bilateral;   CYSTOSCOPY WITH RETROGRADE PYELOGRAM, URETEROSCOPY AND STENT PLACEMENT Bilateral 08/20/2016   Procedure: CYSTOSCOPY WITH BILATERAL RETROGRADE PYELOGRAM, BILATERAL URETEROSCOPY,  LASER LITHOTRIPSY OF RIGHT URETERAL CALCULI, STONE BASKET EXTRACTION LEFT URETERAL CALCULI  AND BILATERAL URETERAL STENT EXCHANGE;  Surgeon: Malen Gauze, MD;  Location: [facility];  Service: Urology;  Laterality: Bilateral;   HOLMIUM LASER APPLICATION Bilateral 08/20/2016   Procedure: HOLMIUM LASER APPLICATION;  Surgeon: Malen Gauze, MD;  Location: [facility];  Service: Urology;  Laterality: Bilateral;   IR CATHETER TUBE CHANGE  07/04/2018   IR EXCHANGE BILIARY DRAIN  06/09/2018   IR EXCHANGE BILIARY DRAIN  10/20/2018   IR PERC CHOLECYSTOSTOMY  05/24/2018   LAPAROTOMY N/A 08/25/2018   Procedure: EXPLORATORY LAPAROTOMY reduction of volvulus;  Surgeon: [name], MD;  Location: [facility];  Service: General;  Laterality: N/A;  Home Medications    Prior to Admission medications   Medication Sig Start Date End Date Taking? Authorizing Provider  aspirin EC 81 MG tablet Take 81 mg by mouth every morning.    Yes [provider]  magnesium oxide (MAG-OX) 400 MG tablet Take 1 tablet (400 mg  total) by mouth daily. 10/23/18  Yes Sharee HolsterGreen, Deborah S, NP  metFORMIN (GLUCOPHAGE) 1000 MG tablet Take 1,000 mg by mouth daily. 10/21/18  Yes [provider]  Nutritional Supplements (ENSURE ENLIVE PO) Take 1 Bottle by mouth 3 (three) times daily. 09/17/18  Yes [provider]  phosphorus (K PHOS NEUTRAL) 155-852-130 MG tablet Take 1 tablet (250 mg total) by mouth daily. 10/23/18  Yes Sharee HolsterGreen, Deborah S, NP  simethicone (MYLICON) 40 MG/0.6ML drops Take 0.6 mLs (40 mg total) by mouth 4 (four) times daily. 10/23/18  Yes Sharee HolsterGreen, Deborah S, NP  tamsulosin (FLOMAX) 0.4 MG CAPS capsule Take 1 capsule (0.4 mg total) by mouth every evening. 10/23/18  Yes Sharee HolsterGreen, Deborah S, NP  Acetaminophen (TYLENOL ARTHRITIS PAIN PO) Take 1 tablet by mouth every 6 (six) hours as needed (pain).    [provider]  Despina HiddenBalsam [place]-Castor Oil [name](VENELEX) OINT Apply topically to sacrum and bilateral buttocks every shift and as needed for blanchable erythema and prevention 09/10/18   [provider]  ciprofloxacin (CIPRO) 500 MG tablet Take 1 tablet (500 mg total) by mouth 2 (two) times daily. One po bid x 7 days 12/25/18   [name], Ryaan Vanwagoner, MD  finasteride (PROSCAR) 5 MG tablet Take 1 tablet (5 mg total) by mouth daily. 10/23/18   Sharee HolsterGreen, Deborah S, NP  metFORMIN (GLUCOPHAGE) 500 MG tablet Take 2 tablets (1,000 mg total) by mouth every morning. Patient not taking: Reported on 12/25/2018 10/23/18   Sharee HolsterGreen, Deborah S, NP  ondansetron (ZOFRAN ODT) 4 MG disintegrating tablet 4mg  ODT q4 hours prn nausea/vomit 12/25/18   [name], Neri Vieyra, MD  polyethylene glycol (MIRALAX / GLYCOLAX) 17 g packet Take 17 g by mouth 2 (two) times daily.  10/20/18   [provider]    Family History Family History  Problem Relation Age of Onset   Gallbladder disease Other     Social History Social History   Tobacco Use   Smoking status: Never Smoker   Smokeless tobacco: Never Used  Substance Use Topics   Alcohol use: No   Drug  use: No     Allergies   Patient has no known allergies.   Review of Systems Review of Systems  Constitutional: Negative for appetite change and fatigue.  HENT: Negative for congestion, ear discharge and sinus pressure.   Eyes: Negative for discharge.  Respiratory: Negative for cough.   Cardiovascular: Negative for chest pain.  Gastrointestinal: Positive for vomiting. Negative for abdominal pain and diarrhea.  Genitourinary: Negative for frequency and hematuria.  Musculoskeletal: Negative for back pain.  Skin: Negative for rash.  Neurological: Negative for seizures and headaches.  Psychiatric/Behavioral: Negative for hallucinations.     Physical Exam Updated Vital Signs BP (!) 94/57    Pulse 64    Temp 98.2 F (36.8 C) (Oral)    Resp 18    SpO2 100%   Physical Exam Vitals signs and nursing note reviewed.  Constitutional:      Appearance: He is well-developed.  HENT:     Head: Normocephalic.  Eyes:     General: No scleral icterus.    Conjunctiva/sclera: Conjunctivae normal.  Neck:     Musculoskeletal: Neck supple.  Thyroid: No thyromegaly.  Cardiovascular:     Rate and Rhythm: Normal rate and regular rhythm.     Heart sounds: No murmur. No friction rub. No gallop.   Pulmonary:     Breath sounds: No stridor. No wheezing or rales.  Chest:     Chest wall: No tenderness.  Abdominal:     General: There is no distension.     Tenderness: There is no abdominal tenderness. There is no rebound.  Musculoskeletal: Normal range of motion.  Lymphadenopathy:     Cervical: No cervical adenopathy.  Skin:    Findings: No erythema or rash.  Neurological:     Motor: No abnormal muscle tone.     Coordination: Coordination normal.     Comments: Awake alert oriented to person this is normal      ED Treatments / Results  Labs (all labs ordered are listed, but only abnormal results are displayed) Labs Reviewed  COMPREHENSIVE METABOLIC PANEL - Abnormal; Notable for the  following components:      Result Value   Glucose, Bld 189 (*)    Albumin 3.4 (*)    AST 10 (*)    Total Bilirubin 0.1 (*)    All other components within normal limits  CBC - Abnormal; Notable for the following components:   WBC 11.2 (*)    RBC 4.04 (*)    Hemoglobin 11.0 (*)    HCT 36.8 (*)    MCHC 29.9 (*)    All other components within normal limits  URINALYSIS, ROUTINE W REFLEX MICROSCOPIC - Abnormal; Notable for the following components:   APPearance CLOUDY (*)    Glucose, UA 50 (*)    Hgb urine dipstick MODERATE (*)    Protein, ur 30 (*)    Leukocytes,Ua SMALL (*)    WBC, UA >50 (*)    Bacteria, UA MANY (*)    All other components within normal limits  URINE CULTURE  LIPASE, BLOOD    EKG None  Radiology Ct Abdomen Pelvis W Contrast  Result Date: 12/25/2018 CLINICAL DATA:  68 year old male with vomiting and abdominal distention. EXAM: CT ABDOMEN AND PELVIS WITH CONTRAST TECHNIQUE: Multidetector CT imaging of the abdomen and pelvis was performed using the standard protocol following bolus administration of intravenous contrast. CONTRAST:  100mL OMNIPAQUE IOHEXOL 300 MG/ML  SOLN COMPARISON:  CT of the abdomen pelvis dated 09/26/2018 FINDINGS: Evaluation of this exam is limited due to respiratory motion artifact. Lower chest: Bibasilar interstitial and nodular densities, primarily involving the right lower lobe new since the prior CT and concerning for pneumonia or possible aspiration. Clinical correlation is recommended. No intra-abdominal free air or free fluid. Hepatobiliary: The liver is unremarkable. No intrahepatic biliary ductal dilatation. A percutaneous cholecystostomy is noted. Pancreas: Unremarkable. No pancreatic ductal dilatation or surrounding inflammatory changes. Spleen: Normal in size without focal abnormality. Adrenals/Urinary Tract: The adrenal glands are unremarkable. There aretwo adjacent stones in the left renal pelvis at the ureteropelvic junction measuring  up to 16 mm in maximal dimension (series 5, image 66). There is associated mild left hydronephrosis. A cluster of stones noted in the inferior pole of the left kidney with combined maximal dimension of 13 mm. There is apparent enhancement of the urothelium of the left renal pelvis. Correlation with urinalysis recommended to evaluate for possible UTI. There is no hydronephrosis nephrolithiasis on the right. Diffusely thickened and trabeculated bladder wall most consistent with chronic bladder outlet obstruction and chronic infection. Correlation with urinalysis recommended to evaluate for possibility  of acute cystitis. Several stones noted within the urinary bladder measuring up to 10 mm. Stomach/Bowel: Evaluation of the bowel is limited in the absence of oral contrast as well as due to respiratory motion artifact. There is large amount of stool throughout the colon. Diffuse air distention of the small bowel and segments of the transverse colon. This finding appears chronic. If there is clinical concern for small-bowel obstruction, further evaluation with small-bowel series recommended. Vascular/Lymphatic: Mild aortoiliac atherosclerotic disease. The IVC is unremarkable. No portal venous gas. There is no adenopathy. Reproductive: Multiple surgical clips or fiducial markers in the prostate gland. Other: None Musculoskeletal: Degenerative changes of the spine and osteopenia. No acute osseous pathology. IMPRESSION: 1. Large amount of stool throughout the colon with diffuse air distention of the small bowel and segments of the transverse colon. This finding appears chronic. If there is clinical concern for small-bowel obstruction, further evaluation with small-bowel series recommended. 2. Large obstructing stones in the left renal pelvis at the UPJ with associated mild left hydronephrosis. Correlation with urinalysis recommended to evaluate for UTI. 3. Thickened and trabeculated bladder wall most consistent with chronic  bladder outlet obstruction and chronic infection. Correlation with urinalysis recommended to evaluate for UTI. Multiple bladder stones noted. 4. Bibasilar interstitial and nodular densities, new since the prior CT and concerning for pneumonia or possible aspiration. Clinical correlation is recommended. 5. Percutaneous cholecystostomy. Aortic Atherosclerosis (ICD10-I70.0). Electronically Signed   By: Anner Crete M.D.   On: 12/25/2018 23:03    Procedures Procedures (including critical care time)  Medications Ordered in ED Medications  sodium chloride 0.9 % bolus 1,000 mL (0 mLs Intravenous Stopped 12/25/18 2315)  ondansetron (ZOFRAN) injection 4 mg (4 mg Intravenous Given 12/25/18 2137)  iohexol (OMNIPAQUE) 300 MG/ML solution 100 mL (100 mLs Intravenous Contrast Given 12/25/18 2233)  cefTRIAXone (ROCEPHIN) 1 g in sodium chloride 0.9 % 100 mL IVPB (0 g Intravenous Stopped 12/25/18 2350)     Initial Impression / Assessment and Plan / ED Course  I have reviewed the triage vital signs and the nursing notes.  Pertinent labs & imaging results that were available during my care of the patient were reviewed by me and considered in my medical decision making (see chart for details).        I spoke with his and the caretaker and she stated that the kidney stones the patient has on CT scan or known and is being treated.  We will culture of his urine and start him on Cipro and Zofran.  The aunt was instructed to call the urologist the next day to arrange follow-up the beginning of the week or sooner Final Clinical Impressions(s) / ED Diagnoses   Final diagnoses:  Acute cystitis with hematuria    ED Discharge Orders         Ordered    ondansetron (ZOFRAN ODT) 4 MG disintegrating tablet     12/25/18 2315    ciprofloxacin (CIPRO) 500 MG tablet  2 times daily     12/25/18 2315           Milton Ferguson, MD 12/26/18 1128

## 2018-12-29 LAB — URINE CULTURE: Culture: >=100000 — AB

## 2018-12-30 ENCOUNTER — Telehealth: Payer: Self-pay | Admitting: *Deleted

## 2018-12-30 NOTE — Telephone Encounter (Signed)
Post ED Visit - Positive Culture Follow-up: Unsuccessful Patient Follow-up  Culture assessed and recommendations reviewed by:  []  Elenor Quinones, Pharm.D. []  Heide Guile, Pharm.D., BCPS AQ-ID []  Parks Neptune, Pharm.D., BCPS []  Alycia Rossetti, Pharm.D., BCPS []  [place], Pharm.D., BCPS, AAHIVP []  Legrand Como, Pharm.D., BCPS, AAHIVP []  Wynell Balloon, PharmD []  Vincenza Hews, PharmD, BCPS  Positive urine culture, reviewed by Nuala Alpha, PA-C  []  Patient discharged without antimicrobial prescription and treatment is now indicated []  Organism is resistant to prescribed ED discharge antimicrobial []  Patient with positive blood cultures  Plan:  Received Ceftrioxone x1 at ED visit, discontine Cipro and no further antibiotics recommened at this time  Unable to contact patient after 3 attempts, letter will be sent to address on file  Ardeen Fillers 12/30/2018, 9:40 AM

## 2018-12-30 NOTE — Progress Notes (Signed)
ED Antimicrobial Stewardship Positive Culture Follow Up   [name] is an 68 y.o. male who presented to [facility] on 12/25/2018 with a chief complaint of  Chief Complaint  Patient presents with  . Emesis    Recent Results (from the past 720 hour(s))  Urine Culture     Status: Abnormal   Collection Time: 12/25/18 10:55 PM   Specimen: Urine, Random  Result Value Ref Range Status   Specimen Description   Final    URINE, RANDOM Performed at [facility], [address] [place], [place] [address]    Special Requests   Final    NONE Performed at [facility], [address] [place], [place] [address]    Culture >=100,000 COLONIES/mL ESCHERICHIA COLI (A)  Final   Report Status 12/29/2018 FINAL  Final   Organism ID, Bacteria ESCHERICHIA COLI (A)  Final      Susceptibility   Escherichia coli - MIC*    AMPICILLIN <=2 SENSITIVE Sensitive     CEFAZOLIN <=4 SENSITIVE Sensitive     CEFTRIAXONE <=1 SENSITIVE Sensitive     CIPROFLOXACIN >=4 RESISTANT Resistant     GENTAMICIN <=1 SENSITIVE Sensitive     IMIPENEM 0.5 SENSITIVE Sensitive     NITROFURANTOIN <=16 SENSITIVE Sensitive     TRIMETH/SULFA >=320 RESISTANT Resistant     AMPICILLIN/SULBACTAM <=2 SENSITIVE Sensitive     PIP/TAZO <=4 SENSITIVE Sensitive     Extended ESBL NEGATIVE Sensitive     * >=100,000 COLONIES/mL ESCHERICHIA COLI    []  Treated with ..., organism resistant to prescribed antimicrobial []  Patient discharged originally without antimicrobial agent and treatment is now indicated  Plan: Discontinue ciprofloxacin. No further antibiotics necessary based on patient's lack of symptoms.  ED Provider: Nuala Alpha, PA-C   Wallene Huh 12/30/2018, 9:32 AM Clinical Pharmacist Monday - Friday phone -  [phone] Saturday - Sunday phone - [phone]

## 2018-12-31 ENCOUNTER — Other Ambulatory Visit: Payer: Self-pay | Admitting: Adult Health

## 2018-12-31 ENCOUNTER — Ambulatory Visit (INDEPENDENT_AMBULATORY_CARE_PROVIDER_SITE_OTHER): Payer: Medicare Other | Admitting: Urology

## 2018-12-31 DIAGNOSIS — N200 Calculus of kidney: Secondary | ICD-10-CM | POA: Diagnosis not present

## 2019-01-06 ENCOUNTER — Other Ambulatory Visit (HOSPITAL_COMMUNITY): Payer: Self-pay | Admitting: Diagnostic Radiology

## 2019-01-06 ENCOUNTER — Other Ambulatory Visit: Payer: Self-pay | Admitting: Urology

## 2019-01-06 ENCOUNTER — Other Ambulatory Visit: Payer: Self-pay | Admitting: Adult Health

## 2019-01-06 DIAGNOSIS — Z434 Encounter for attention to other artificial openings of digestive tract: Secondary | ICD-10-CM

## 2019-01-08 ENCOUNTER — Other Ambulatory Visit: Payer: Self-pay

## 2019-01-08 ENCOUNTER — Encounter (HOSPITAL_COMMUNITY): Payer: Self-pay

## 2019-01-09 ENCOUNTER — Encounter (HOSPITAL_COMMUNITY)
Admission: RE | Admit: 2019-01-09 | Discharge: 2019-01-09 | Disposition: A | Discharge status: Home / Self Care | Payer: Medicare Other | Source: Ambulatory Visit | Attending: Urology | Admitting: Urology

## 2019-01-12 ENCOUNTER — Ambulatory Visit (HOSPITAL_COMMUNITY)
Admission: RE | Admit: 2019-01-12 | Discharge: 2019-01-12 | Disposition: A | Discharge status: Home / Self Care | Payer: Medicare Other | Source: Ambulatory Visit | Attending: Diagnostic Radiology | Admitting: Diagnostic Radiology

## 2019-01-12 ENCOUNTER — Inpatient Hospital Stay (HOSPITAL_COMMUNITY): Admit: 2019-01-12 | Payer: Medicare Other

## 2019-01-12 ENCOUNTER — Encounter (HOSPITAL_COMMUNITY): Payer: Self-pay | Admitting: Interventional Radiology

## 2019-01-12 ENCOUNTER — Other Ambulatory Visit (HOSPITAL_COMMUNITY): Payer: Self-pay | Admitting: Interventional Radiology

## 2019-01-12 ENCOUNTER — Other Ambulatory Visit: Payer: Self-pay

## 2019-01-12 DIAGNOSIS — Z434 Encounter for attention to other artificial openings of digestive tract: Secondary | ICD-10-CM | POA: Diagnosis not present

## 2019-01-12 DIAGNOSIS — K811 Chronic cholecystitis: Secondary | ICD-10-CM | POA: Insufficient documentation

## 2019-01-12 DIAGNOSIS — Z0000 Encounter for general adult medical examination without abnormal findings: Secondary | ICD-10-CM

## 2019-01-12 HISTORY — PX: IR EXCHANGE BILIARY DRAIN: IMG6046

## 2019-01-12 MED ORDER — LIDOCAINE HCL (PF) 1 % IJ SOLN
INTRAMUSCULAR | Status: DC | PRN
  Administered 2019-01-12: 5 mL

## 2019-01-12 MED ORDER — LIDOCAINE HCL 1 % IJ SOLN
INTRAMUSCULAR | Status: AC
  Filled 2019-01-12: qty 20

## 2019-01-12 MED ORDER — IOHEXOL 300 MG/ML  SOLN: 50.0000 mL | Freq: Once | INTRAMUSCULAR | Status: DC | PRN

## 2019-01-12 NOTE — Procedures (Signed)
Interventional Radiology Procedure Note  Procedure: Image guided perc chole exchange.  New 56F drain.  Complications: None Recommendations:  - To gravity drain  Signed,  Dulcy Fanny. Earleen Newport, DO

## 2019-01-13 ENCOUNTER — Other Ambulatory Visit (HOSPITAL_COMMUNITY)
Admission: RE | Admit: 2019-01-13 | Discharge: 2019-01-13 | Disposition: A | Discharge status: Home / Self Care | Payer: Medicare Other | Source: Ambulatory Visit | Attending: Urology | Admitting: Urology

## 2019-01-13 DIAGNOSIS — Z01812 Encounter for preprocedural laboratory examination: Secondary | ICD-10-CM | POA: Diagnosis not present

## 2019-01-13 DIAGNOSIS — Z20828 Contact with and (suspected) exposure to other viral communicable diseases: Secondary | ICD-10-CM | POA: Insufficient documentation

## 2019-01-13 LAB — SARS CORONAVIRUS 2 (TAT 6-24 HRS): SARS Coronavirus 2: NEGATIVE

## 2019-01-14 ENCOUNTER — Encounter (HOSPITAL_COMMUNITY)
Admission: RE | Disposition: A | Discharge status: Home / Self Care | Payer: Self-pay | Source: Home / Self Care | Attending: Urology

## 2019-01-14 ENCOUNTER — Ambulatory Visit (HOSPITAL_COMMUNITY): Payer: Medicare Other | Admitting: Anesthesiology

## 2019-01-14 ENCOUNTER — Encounter (HOSPITAL_COMMUNITY): Payer: Self-pay | Admitting: Radiology

## 2019-01-14 ENCOUNTER — Ambulatory Visit (HOSPITAL_COMMUNITY)
Admission: RE | Admit: 2019-01-14 | Discharge: 2019-01-14 | Disposition: A | Discharge status: Home / Self Care | Payer: Medicare Other | Attending: Urology | Admitting: Urology

## 2019-01-14 ENCOUNTER — Ambulatory Visit (HOSPITAL_COMMUNITY): Payer: Medicare Other

## 2019-01-14 ENCOUNTER — Other Ambulatory Visit: Payer: Self-pay

## 2019-01-14 DIAGNOSIS — N210 Calculus in bladder: Secondary | ICD-10-CM | POA: Insufficient documentation

## 2019-01-14 DIAGNOSIS — N200 Calculus of kidney: Secondary | ICD-10-CM | POA: Insufficient documentation

## 2019-01-14 DIAGNOSIS — N201 Calculus of ureter: Secondary | ICD-10-CM

## 2019-01-14 DIAGNOSIS — E119 Type 2 diabetes mellitus without complications: Secondary | ICD-10-CM | POA: Diagnosis not present

## 2019-01-14 DIAGNOSIS — I10 Essential (primary) hypertension: Secondary | ICD-10-CM | POA: Insufficient documentation

## 2019-01-14 HISTORY — PX: HOLMIUM LASER APPLICATION: SHX5852

## 2019-01-14 HISTORY — PX: CYSTOSCOPY WITH URETHRAL DILATATION: SHX5125

## 2019-01-14 HISTORY — PX: CYSTOSCOPY WITH RETROGRADE PYELOGRAM, URETEROSCOPY AND STENT PLACEMENT: SHX5789

## 2019-01-14 LAB — GLUCOSE, CAPILLARY
Glucose-Capillary: 104 mg/dL — ABNORMAL HIGH (ref 70–99)
Glucose-Capillary: 119 mg/dL — ABNORMAL HIGH (ref 70–99)

## 2019-01-14 SURGERY — CYSTOURETEROSCOPY, WITH RETROGRADE PYELOGRAM AND STENT INSERTION
Anesthesia: General | Laterality: Left

## 2019-01-14 MED ORDER — PROPOFOL 10 MG/ML IV BOLUS
INTRAVENOUS | Status: AC
  Filled 2019-01-14: qty 20

## 2019-01-14 MED ORDER — ROCURONIUM BROMIDE 100 MG/10ML IV SOLN
INTRAVENOUS | Status: DC | PRN
  Administered 2019-01-14: 50 mg via INTRAVENOUS

## 2019-01-14 MED ORDER — MIDAZOLAM HCL 2 MG/2ML IJ SOLN: 0.5000 mg | Freq: Once | INTRAMUSCULAR | Status: DC | PRN

## 2019-01-14 MED ORDER — EPHEDRINE 5 MG/ML INJ
INTRAVENOUS | Status: AC
  Filled 2019-01-14: qty 10

## 2019-01-14 MED ORDER — DIATRIZOATE MEGLUMINE 30 % UR SOLN
URETHRAL | Status: DC | PRN
  Administered 2019-01-14: 100 mL via URETHRAL

## 2019-01-14 MED ORDER — SUCCINYLCHOLINE CHLORIDE 200 MG/10ML IV SOSY
PREFILLED_SYRINGE | INTRAVENOUS | Status: DC | PRN
  Administered 2019-01-14: 120 mg via INTRAVENOUS

## 2019-01-14 MED ORDER — DIATRIZOATE MEGLUMINE 30 % UR SOLN
URETHRAL | Status: AC
  Filled 2019-01-14: qty 100

## 2019-01-14 MED ORDER — ACETAMINOPHEN 10 MG/ML IV SOLN
INTRAVENOUS | Status: AC
  Filled 2019-01-14: qty 100

## 2019-01-14 MED ORDER — SUGAMMADEX SODIUM 500 MG/5ML IV SOLN
INTRAVENOUS | Status: DC | PRN
  Administered 2019-01-14: 200 mg via INTRAVENOUS

## 2019-01-14 MED ORDER — ONDANSETRON HCL 4 MG/2ML IJ SOLN
INTRAMUSCULAR | Status: AC
  Filled 2019-01-14: qty 2

## 2019-01-14 MED ORDER — SODIUM CHLORIDE 0.9 % IR SOLN
Status: DC | PRN
  Administered 2019-01-14 (×2): 3000 mL

## 2019-01-14 MED ORDER — WATER FOR IRRIGATION, STERILE IR SOLN
Status: DC | PRN
  Administered 2019-01-14: 1000 mL

## 2019-01-14 MED ORDER — LACTATED RINGERS IV SOLN
INTRAVENOUS | Status: DC
  Administered 2019-01-14: 12:00:00 via INTRAVENOUS

## 2019-01-14 MED ORDER — SODIUM CHLORIDE 0.9 % IV SOLN
INTRAVENOUS | Status: AC
  Filled 2019-01-14: qty 20

## 2019-01-14 MED ORDER — HYDROCODONE-ACETAMINOPHEN 7.5-325 MG PO TABS: 1.0000 | ORAL_TABLET | Freq: Once | ORAL | Status: DC | PRN

## 2019-01-14 MED ORDER — LIDOCAINE HCL (CARDIAC) PF 100 MG/5ML IV SOSY
PREFILLED_SYRINGE | INTRAVENOUS | Status: DC | PRN
  Administered 2019-01-14: 50 mg via INTRAVENOUS

## 2019-01-14 MED ORDER — SUCCINYLCHOLINE CHLORIDE 200 MG/10ML IV SOSY
PREFILLED_SYRINGE | INTRAVENOUS | Status: AC
  Filled 2019-01-14: qty 10

## 2019-01-14 MED ORDER — FENTANYL CITRATE (PF) 100 MCG/2ML IJ SOLN
INTRAMUSCULAR | Status: AC
  Filled 2019-01-14: qty 2

## 2019-01-14 MED ORDER — LIDOCAINE 2% (20 MG/ML) 5 ML SYRINGE
INTRAMUSCULAR | Status: AC
  Filled 2019-01-14: qty 5

## 2019-01-14 MED ORDER — BUPIVACAINE LIPOSOME 1.3 % IJ SUSP
INTRAMUSCULAR | Status: AC
  Filled 2019-01-14: qty 20

## 2019-01-14 MED ORDER — SODIUM CHLORIDE 0.9 % IV SOLN
2.0000 g | INTRAVENOUS | Status: AC
  Administered 2019-01-14: 12:00:00 2 g via INTRAVENOUS

## 2019-01-14 MED ORDER — PROPOFOL 10 MG/ML IV BOLUS
INTRAVENOUS | Status: DC | PRN
  Administered 2019-01-14: 150 mg via INTRAVENOUS

## 2019-01-14 MED ORDER — ROCURONIUM BROMIDE 100 MG/10ML IV SOLN: INTRAVENOUS | Status: DC | PRN

## 2019-01-14 MED ORDER — FENTANYL CITRATE (PF) 100 MCG/2ML IJ SOLN
INTRAMUSCULAR | Status: DC | PRN
  Administered 2019-01-14: 100 ug via INTRAVENOUS

## 2019-01-14 MED ORDER — PROMETHAZINE HCL 25 MG/ML IJ SOLN: 6.2500 mg | INTRAMUSCULAR | Status: DC | PRN

## 2019-01-14 MED ORDER — HYDROMORPHONE HCL 1 MG/ML IJ SOLN: 0.2500 mg | INTRAMUSCULAR | Status: DC | PRN

## 2019-01-14 MED ORDER — HYDROCODONE-ACETAMINOPHEN 5-325 MG PO TABS: 1.0000 | ORAL_TABLET | ORAL | 0 refills | Status: DC | PRN

## 2019-01-14 MED ORDER — ONDANSETRON HCL 4 MG/2ML IJ SOLN
INTRAMUSCULAR | Status: DC | PRN
  Administered 2019-01-14: 4 mg via INTRAVENOUS

## 2019-01-14 MED ORDER — EPHEDRINE SULFATE 50 MG/ML IJ SOLN
INTRAMUSCULAR | Status: DC | PRN
  Administered 2019-01-14 (×3): 10 mg via INTRAVENOUS

## 2019-01-14 SURGICAL SUPPLY — 26 items
BAG DRAIN URO TABLE W/ADPT NS (BAG) ×4 IMPLANT
BAG DRN 8 ADPR NS SKTRN CSTL (BAG) ×1
CATH INTERMIT  6FR 70CM (CATHETERS) ×4 IMPLANT
CLOTH BEACON ORANGE TIMEOUT ST (SAFETY) ×4 IMPLANT
DECANTER SPIKE VIAL GLASS SM (MISCELLANEOUS) ×4 IMPLANT
EXTRACTOR STONE NITINOL NGAGE (UROLOGICAL SUPPLIES) ×4 IMPLANT
FIBER LASER FLEXIVA 200 (UROLOGICAL SUPPLIES) ×4 IMPLANT
GLOVE BIO SURGEON STRL SZ8 (GLOVE) ×4 IMPLANT
GLOVE BIOGEL PI IND STRL 7.0 (GLOVE) ×4 IMPLANT
GLOVE BIOGEL PI INDICATOR 7.0 (GLOVE) ×4
GOWN STRL REUS W/ TWL XL LVL3 (GOWN DISPOSABLE) ×2 IMPLANT
GOWN STRL REUS W/TWL LRG LVL3 (GOWN DISPOSABLE) ×4 IMPLANT
GOWN STRL REUS W/TWL XL LVL3 (GOWN DISPOSABLE) ×2
GUIDEWIRE STR DUAL SENSOR (WIRE) ×4 IMPLANT
GUIDEWIRE STR ZIPWIRE 035X150 (MISCELLANEOUS) ×4 IMPLANT
IV NS IRRIG 3000ML ARTHROMATIC (IV SOLUTION) ×8 IMPLANT
KIT TURNOVER CYSTO (KITS) ×4 IMPLANT
LASER FIBER DISP 1000U (UROLOGICAL SUPPLIES) ×4 IMPLANT
MANIFOLD NEPTUNE II (INSTRUMENTS) ×4 IMPLANT
PACK CYSTO (CUSTOM PROCEDURE TRAY) ×4 IMPLANT
PAD ARMBOARD 7.5X6 YLW CONV (MISCELLANEOUS) ×4 IMPLANT
SHEATH URETERAL 12FRX35CM (MISCELLANEOUS) ×4 IMPLANT
STENT URET 6FRX26 CONTOUR (STENTS) ×4 IMPLANT
SYR 10ML LL (SYRINGE) ×4 IMPLANT
TOWEL OR 17X26 4PK STRL BLUE (TOWEL DISPOSABLE) ×4 IMPLANT
WATER STERILE IRR 500ML POUR (IV SOLUTION) ×4 IMPLANT

## 2019-01-14 NOTE — Anesthesia Preprocedure Evaluation (Signed)
Anesthesia Evaluation  Patient identified by MRN, date of birth, ID band Patient awake    Reviewed: Allergy & Precautions, NPO status , Patient's Chart, lab work & pertinent test results  Airway Mallampati: II  TM Distance: >3 FB Neck ROM: Full    Dental no notable dental hx. (+) Edentulous Upper, Edentulous Lower   Pulmonary neg pulmonary ROS,    Pulmonary exam normal breath sounds clear to auscultation       Cardiovascular Exercise Tolerance: Good hypertension, Pt. on medications negative cardio ROS Normal cardiovascular examI Rhythm:Regular Rate:Normal  Caregiver denies known cardiac issues or known CP States uses walker - but states can go a mile    Neuro/Psych Pt is nonverbal since 68 years old according to care giver -Aunt - pleasant , non combative, cooperative with simple commands negative neurological ROS  negative psych ROS   GI/Hepatic negative GI ROS, Neg liver ROS,   Endo/Other  negative endocrine ROSdiabetes, Well Controlled, Type 2, Oral Hypoglycemic Agents  Renal/GU Renal diseaseStones - here for Cysto, ureteroscopy,Laser Litho   negative genitourinary   Musculoskeletal negative musculoskeletal ROS (+)   Abdominal   Peds negative pediatric ROS (+)  Hematology negative hematology ROS (+) anemia ,   Anesthesia Other Findings   Reproductive/Obstetrics negative OB ROS                             Anesthesia Physical Anesthesia Plan  ASA: III  Anesthesia Plan: General   Post-op Pain Management:    Induction: Intravenous  PONV Risk Score and Plan: 2 and Ondansetron, Treatment may vary due to age or medical condition and Dexamethasone  Airway Management Planned: Oral ETT  Additional Equipment:   Intra-op Plan:   Post-operative Plan: Extubation in OR  Informed Consent: I have reviewed the patients History and Physical, chart, labs and discussed the procedure including  the risks, benefits and alternatives for the proposed anesthesia with the patient or authorized representative who has indicated his/her understanding and acceptance.     Dental advisory given  Plan Discussed with: CRNA  Anesthesia Plan Comments: (Plan Full PPE use Plan GETA D/W PT -WTP with same after Q&A)        Anesthesia Quick Evaluation

## 2019-01-14 NOTE — Discharge Instructions (Signed)
Ureteral Stent Implantation, Care After °This sheet gives you information about how to care for yourself after your procedure. Your health care provider may also give you more specific instructions. If you have problems or questions, contact your health care provider. °What can I expect after the procedure? °After the procedure, it is common to have: °· Nausea. °· Mild pain when you urinate. You may feel this pain in your lower back or lower abdomen. The pain should stop within a few minutes after you urinate. This may last for up to 1 week. °· A small amount of blood in your urine for several days. °Follow these instructions at home: °Medicines °· Take over-the-counter and prescription medicines only as told by your health care provider. °· If you were prescribed an antibiotic medicine, take it as told by your health care provider. Do not stop taking the antibiotic even if you start to feel better. °· Do not drive for 24 hours if you were given a sedative during your procedure. °· Ask your health care provider if the medicine prescribed to you requires you to avoid driving or using heavy machinery. °Activity °· Rest as told by your health care provider. °· Avoid sitting for a long time without moving. Get up to take short walks every 1-2 hours. This is important to improve blood flow and breathing. Ask for help if you feel weak or unsteady. °· Return to your normal activities as told by your health care provider. Ask your health care provider what activities are safe for you. °General instructions ° °· Watch for any blood in your urine. Call your health care provider if the amount of blood in your urine increases. °· If you have a catheter: °? Follow instructions from your health care provider about taking care of your catheter and collection bag. °? Do not take baths, swim, or use a hot tub until your health care provider approves. Ask your health care provider if you may take showers. You may only be allowed to  take sponge baths. °· Drink enough fluid to keep your urine pale yellow. °· Do not use any products that contain nicotine or tobacco, such as cigarettes, e-cigarettes, and chewing tobacco. These can delay healing after surgery. If you need help quitting, ask your health care provider. °· Keep all follow-up visits as told by your health care provider. This is important. °Contact a health care provider if: °· You have pain that gets worse or does not get better with medicine, especially pain when you urinate. °· You have difficulty urinating. °· You feel nauseous or you vomit repeatedly during a period of more than 2 days after the procedure. °Get help right away if: °· Your urine is dark red or has blood clots in it. °· You are leaking urine (have incontinence). °· The end of the stent comes out of your urethra. °· You cannot urinate. °· You have sudden, sharp, or severe pain in your abdomen or lower back. °· You have a fever. °· You have swelling or pain in your legs. °· You have difficulty breathing. °Summary °· After the procedure, it is common to have mild pain when you urinate that goes away within a few minutes after you urinate. This may last for up to 1 week. °· Watch for any blood in your urine. Call your health care provider if the amount of blood in your urine increases. °· Take over-the-counter and prescription medicines only as told by your health care provider. °· Drink   enough fluid to keep your urine pale yellow. °This information is not intended to replace advice given to you by your health care provider. Make sure you discuss any questions you have with your health care provider. °Document Released: 12/03/2012 Document Revised: 01/07/2018 Document Reviewed: 01/08/2018 °Elsevier Patient Education © 2020 [employer] ° ° °General Anesthesia, Adult, Care After °This sheet gives you information about how to care for yourself after your procedure. Your health care provider may also give you more specific  instructions. If you have problems or questions, contact your health care provider. °What can I expect after the procedure? °After the procedure, the following side effects are common: °· Pain or discomfort at the IV site. °· Nausea. °· Vomiting. °· Sore throat. °· Trouble concentrating. °· Feeling cold or chills. °· Weak or tired. °· Sleepiness and fatigue. °· Soreness and body aches. These side effects can affect parts of the body that were not involved in surgery. °Follow these instructions at home: ° °For at least 24 hours after the procedure: °· Have a responsible adult stay with you. It is important to have someone help care for you until you are awake and alert. °· Rest as needed. °· Do not: °? Participate in activities in which you could fall or become injured. °? Drive. °? Use heavy machinery. °? Drink alcohol. °? Take sleeping pills or medicines that cause drowsiness. °? Make important decisions or sign legal documents. °? Take care of children on your own. °Eating and drinking °· Follow any instructions from your health care provider about eating or drinking restrictions. °· When you feel hungry, start by eating small amounts of foods that are soft and easy to digest (bland), such as toast. Gradually return to your regular diet. °· Drink enough fluid to keep your urine pale yellow. °· If you vomit, rehydrate by drinking water, juice, or clear broth. °General instructions °· If you have sleep apnea, surgery and certain medicines can increase your risk for breathing problems. Follow instructions from your health care provider about wearing your sleep device: °? Anytime you are sleeping, including during daytime naps. °? While taking prescription pain medicines, sleeping medicines, or medicines that make you drowsy. °· Return to your normal activities as told by your health care provider. Ask your health care provider what activities are safe for you. °· Take over-the-counter and prescription medicines only  as told by your health care provider. °· If you smoke, do not smoke without supervision. °· Keep all follow-up visits as told by your health care provider. This is important. °Contact a health care provider if: °· You have nausea or vomiting that does not get better with medicine. °· You cannot eat or drink without vomiting. °· You have pain that does not get better with medicine. °· You are unable to pass urine. °· You develop a skin rash. °· You have a fever. °· You have redness around your IV site that gets worse. °Get help right away if: °· You have difficulty breathing. °· You have chest pain. °· You have blood in your urine or stool, or you vomit blood. °Summary °· After the procedure, it is common to have a sore throat or nausea. It is also common to feel tired. °· Have a responsible adult stay with you for the first 24 hours after general anesthesia. It is important to have someone help care for you until you are awake and alert. °· When you feel hungry, start by eating small amounts of   foods that are soft and easy to digest (bland), such as toast. Gradually return to your regular diet. °· Drink enough fluid to keep your urine pale yellow. °· Return to your normal activities as told by your health care provider. Ask your health care provider what activities are safe for you. °This information is not intended to replace advice given to you by your health care provider. Make sure you discuss any questions you have with your health care provider. °Document Released: 07/09/2000 Document Revised: 04/05/2017 Document Reviewed: 11/16/2016 °Elsevier Patient Education © 2020 Elsevier Inc. ° ° °

## 2019-01-14 NOTE — Anesthesia Procedure Notes (Signed)
Procedure Name: Intubation Date/Time: 01/14/2019 12:21 PM Performed by: Jonna Munro, CRNA Pre-anesthesia Checklist: Patient identified, Emergency Drugs available, Suction available, Patient being monitored and Timeout performed Patient Re-evaluated:Patient Re-evaluated prior to induction Oxygen Delivery Method: Circle system utilized Preoxygenation: Pre-oxygenation with 100% oxygen Induction Type: IV induction Laryngoscope Size: Mac and 4 Grade View: Grade I Tube type: Oral Tube size: 7.0 mm Number of attempts: 1 Airway Equipment and Method: Stylet Placement Confirmation: ETT inserted through vocal cords under direct vision,  positive ETCO2 and breath sounds checked- equal and bilateral Secured at: 23 cm Tube secured with: Tape Dental Injury: Teeth and Oropharynx as per pre-operative assessment

## 2019-01-14 NOTE — Anesthesia Postprocedure Evaluation (Signed)
Anesthesia Post Note  Patient: [name]  Procedure(s) Performed: CYSTOSCOPY WITH RETROGRADE PYELOGRAM, URETEROSCOPY AND STENT PLACEMENT (Left ) HOLMIUM LASER APPLICATION (Left ) CYSTOSCOPY WITH URETHRAL DILATATION  Patient location during evaluation: PACU Anesthesia Type: General Level of consciousness: awake and patient cooperative Pain management: pain level controlled Vital Signs Assessment: post-procedure vital signs reviewed and stable Respiratory status: spontaneous breathing Cardiovascular status: stable Postop Assessment: no apparent nausea or vomiting Anesthetic complications: no     Last Vitals:  Vitals:   01/14/19 1100 01/14/19 1400  BP: 105/63 123/61  Pulse: (!) 54 63  Resp: 16 15  Temp: 36.4 C (P) 36.6 C  SpO2: 99% 100%    Last Pain:  Vitals:   01/14/19 1100  PainSc: 0-No pain                 Dorethy Tomey

## 2019-01-14 NOTE — H&P (Signed)
Urology Admission H&P  Chief Complaint: left flank pain  History of Present Illness: [name] is a 68yo with a hx of nephrolithiasis who developed worsening abdominal pain and underwent CT which showed worsening left stone burden and bladder calculi. He has urgency, frequency and urinary incontinence. No dysuria. No recent hematuria. No fevers/cills/sweats  Past Medical History:  Diagnosis Date  . Diabetes mellitus without complication ([place])   . History of gout   . History of kidney stones   . Hypertension   . Mentally challenged   . Urinary retention    Past Surgical History:  Procedure Laterality Date  . CYSTOSCOPY W/ URETERAL STENT PLACEMENT Bilateral 06/20/2016   Procedure: CYSTOSCOPY WITH RETROGRADE PYELOGRAM/URETERAL STENT PLACEMENT;  Surgeon: Cleon Gustin, MD;  Location: [facility];  Service: Urology;  Laterality: Bilateral;  . CYSTOSCOPY WITH RETROGRADE PYELOGRAM, URETEROSCOPY AND STENT PLACEMENT Bilateral 08/20/2016   Procedure: CYSTOSCOPY WITH BILATERAL RETROGRADE PYELOGRAM, BILATERAL URETEROSCOPY,  LASER LITHOTRIPSY OF RIGHT URETERAL CALCULI, STONE BASKET EXTRACTION LEFT URETERAL CALCULI  AND BILATERAL URETERAL STENT EXCHANGE;  Surgeon: Cleon Gustin, MD;  Location: [facility];  Service: Urology;  Laterality: Bilateral;  . HOLMIUM LASER APPLICATION Bilateral 11/15/9560   Procedure: HOLMIUM LASER APPLICATION;  Surgeon: Cleon Gustin, MD;  Location: [facility];  Service: Urology;  Laterality: Bilateral;  . IR CATHETER TUBE CHANGE  07/04/2018  . IR EXCHANGE BILIARY DRAIN  06/09/2018  . IR EXCHANGE BILIARY DRAIN  10/20/2018  . IR EXCHANGE BILIARY DRAIN  01/12/2019  . IR PERC CHOLECYSTOSTOMY  05/24/2018  . LAPAROTOMY N/A 08/25/2018   Procedure: EXPLORATORY LAPAROTOMY reduction of volvulus;  Surgeon: Virl Cagey, MD;  Location: [facility];  Service: General;  Laterality: N/A;    Home Medications:  Current Facility-Administered Medications  Medication Dose Route Frequency Provider  Last Rate Last Dose  . cefTRIAXone (ROCEPHIN) 2 g in sodium chloride 0.9 % 100 mL IVPB  2 g Intravenous 30 min Pre-Op Kalisha Keadle, Candee Furbish, MD      . lactated ringers infusion   Intravenous Continuous Hilaria Ota Talbert Forest, MD       Allergies: No Known Allergies  Family History  Problem Relation Age of Onset  . Gallbladder disease Other    Social History:  reports that he has never smoked. He has never used smokeless tobacco. He reports that he does not drink alcohol or use drugs.  Review of Systems  Genitourinary: Positive for flank pain.  All other systems reviewed and are negative.   Physical Exam:  Vital signs in last 24 hours: Temp:  [97.6 F (36.4 C)] 97.6 F (36.4 C) (09/30 1100) Pulse Rate:  [54] 54 (09/30 1100) Resp:  [16] 16 (09/30 1100) BP: (105)/(63) 105/63 (09/30 1100) SpO2:  [99 %] 99 % (09/30 1100) Physical Exam  Constitutional: He is oriented to person, place, and time. He appears well-developed and well-nourished.  HENT:  Head: Normocephalic and atraumatic.  Eyes: Pupils are equal, round, and reactive to light. EOM are normal.  Neck: Normal range of motion. No thyromegaly present.  Cardiovascular: Normal rate and regular rhythm.  Respiratory: Effort normal. No respiratory distress.  GI: Soft. He exhibits no distension.  Musculoskeletal: Normal range of motion.        General: No edema.  Neurological: He is alert and oriented to person, place, and time.  Skin: Skin is warm and dry.  Psychiatric: He has a normal mood and affect. His behavior is normal. Judgment and thought content normal.    Laboratory  Data:  Results for orders placed or performed during the hospital encounter of 01/14/19 (from the past 24 hour(s))  Glucose, capillary     Status: Abnormal   Collection Time: 01/14/19 10:48 AM  Result Value Ref Range   Glucose-Capillary 104 (H) 70 - 99 mg/dL   Recent Results (from the past 240 hour(s))  SARS CORONAVIRUS 2 (TAT 6-24 HRS) Nasopharyngeal  Nasopharyngeal Swab     Status: None   Collection Time: 01/13/19  7:01 AM   Specimen: Nasopharyngeal Swab  Result Value Ref Range Status   SARS Coronavirus 2 NEGATIVE NEGATIVE Final    Comment: (NOTE) SARS-CoV-2 target nucleic acids are NOT DETECTED. The SARS-CoV-2 RNA is generally detectable in upper and lower respiratory specimens during the acute phase of infection. Negative results do not preclude SARS-CoV-2 infection, do not rule out co-infections with other pathogens, and should not be used as the sole basis for treatment or other patient management decisions. Negative results must be combined with clinical observations, patient history, and epidemiological information. The expected result is Negative. Fact Sheet for Patients: HairSlick.no Fact Sheet for Healthcare Providers: quierodirigir.com This test is not yet approved or cleared by the [place] FDA and  has been authorized for detection and/or diagnosis of SARS-CoV-2 by FDA under an Emergency Use Authorization (EUA). This EUA will remain  in effect (meaning this test can be used) for the duration of the COVID-19 declaration under Section 56 4(b)(1) of the Act, 21 U.S.C. section 360bbb-3(b)(1), unless the authorization is terminated or revoked sooner. Performed at [facility], [address], [place], [place] [address]    Creatinine: No results for input(s): CREATININE in the last 168 hours. Baseline Creatinine: unknown  Impression/Assessment:   68yo with left ureteral calculi and bladder calculi  Plan:  The risks/benefits/alternatives to left ureteroscopic stone extraction and cystolithalopaxy was explained to the patient and he understands and wishes to proceed with surgery  [name] 01/14/2019, 12:08 PM

## 2019-01-14 NOTE — Op Note (Signed)
.  Preoperative diagnosis: Left renal calculi, bladder calculi  Postoperative diagnosis: Same  Procedure: 1 cystoscopy 2. Left retrograde pyelography 3.  Intraoperative fluoroscopy, under one hour, with interpretation 4.  Left ureteroscopic stone manipulation with laser lithotripsy 5.  Left 6 x 26 JJ stent placement 6. cystolithalopaxy for a stone under 2.5cm  Attending: Rosie Fate  Anesthesia: General  Estimated blood loss: None  Drains: Left 6 x 26 JJ ureteral stent without tether  Specimens: stone for analysis  Antibiotics: ancef  Findings: left mid and lower pole stone. No hydronephrosis. No masses/lesions in the bladder. Ureteral orifices in normal anatomic location.  Indications: Patient is a 68 year old male with a history of left renal stone and who has persistent left flank pain.  After discussing treatment options, he decided proceed with left ureteroscopic stone manipulation and cystolithalopaxy.  Procedure her in detail: The patient was brought to the operating room and a brief timeout was done to ensure correct patient, correct procedure, correct site.  General anesthesia was administered patient was placed in dorsal lithotomy position.  Her genitalia was then prepped and draped in usual sterile fashion.  A rigid 71 French cystoscope was passed in the urethra and the bladder.  Bladder was inspected free masses or lesions.  the ureteral orifices were in the normal orthotopic locations.  a 6 french ureteral catheter was then instilled into the left ureteral orifice.  a gentle retrograde was obtained and findings noted above.  we then placed a zip wire through the ureteral catheter and advanced up to the renal pelvis.  we then removed the cystoscope and cannulated the left ureteral orifice with a semirigid ureteroscope.  No stone was found in the ureter. Once we reached the UPJ a sensor wire was advanced in to the renal pelvis. We then removed the ureteroscope and advanced  am 12/14 x 38cm access sheath up to the renal pelvis. We then used the flexible ureteroscope to perform nephroscopy. We encountered multiple calculi in the mid and lower poles. Using a 200nm laser fiber the stone was fragmented. the pieces were then removed with a Ngage basket.    once all stone fragments were removed we then removed the access sheath under direct vision and noted no injury to the ureter. We then placed a 6 x 26 double-j ureteral stent over the original zip wire.  We then removed the wire and good coil was noted in the the renal pelvis under fluoroscopy and the bladder under direct vision. We then broke the calculi in the bladder using the laser and then the fragments were irrigated from the bladder.  the bladder was then drained and this concluded the procedure which was well tolerated by patient.  Complications: None  Condition: Stable, extubated, transferred to PACU  Plan: Patient is to be discharged home as to follow-up in one week for stent removal.

## 2019-01-14 NOTE — Transfer of Care (Signed)
Immediate Anesthesia Transfer of Care Note  Patient: [name]  Procedure(s) Performed: CYSTOSCOPY WITH RETROGRADE PYELOGRAM, URETEROSCOPY AND STENT PLACEMENT (Left ) HOLMIUM LASER APPLICATION (Left ) CYSTOSCOPY WITH URETHRAL DILATATION  Patient Location: PACU  Anesthesia Type:General  Level of Consciousness: awake, alert  and oriented  Airway & Oxygen Therapy: Patient Spontanous Breathing  Post-op Assessment: Report given to RN and Post -op Vital signs reviewed and stable  Post vital signs: Reviewed and stable  Last Vitals:  Vitals Value Taken Time  BP    Temp    Pulse 25 01/14/19 1356  Resp    SpO2 93 % 01/14/19 1356  Vitals shown include unvalidated device data.  Last Pain:  Vitals:   01/14/19 1100  PainSc: 0-No pain         Complications: No apparent anesthesia complications

## 2019-01-15 ENCOUNTER — Encounter (HOSPITAL_COMMUNITY): Payer: Self-pay | Admitting: Urology

## 2019-01-21 ENCOUNTER — Ambulatory Visit (INDEPENDENT_AMBULATORY_CARE_PROVIDER_SITE_OTHER): Payer: Medicare Other | Admitting: Urology

## 2019-01-21 DIAGNOSIS — N200 Calculus of kidney: Secondary | ICD-10-CM | POA: Diagnosis not present

## 2019-01-22 ENCOUNTER — Other Ambulatory Visit (HOSPITAL_COMMUNITY): Payer: Self-pay | Admitting: Urology

## 2019-01-22 ENCOUNTER — Other Ambulatory Visit: Payer: Self-pay | Admitting: Urology

## 2019-01-22 DIAGNOSIS — N200 Calculus of kidney: Secondary | ICD-10-CM

## 2019-01-23 LAB — CALCULI, WITH PHOTOGRAPH (CLINICAL LAB)
Calcium Oxalate Dihydrate: 50 %
Calcium Oxalate Monohydrate: 20 %
Hydroxyapatite: 30 %
Weight Calculi: 453 mg

## 2019-03-02 ENCOUNTER — Other Ambulatory Visit: Payer: Self-pay

## 2019-03-02 ENCOUNTER — Ambulatory Visit (HOSPITAL_COMMUNITY)
Admission: RE | Admit: 2019-03-02 | Discharge: 2019-03-02 | Disposition: A | Discharge status: Home / Self Care | Payer: Medicare Other | Source: Ambulatory Visit | Attending: Urology | Admitting: Urology

## 2019-03-02 DIAGNOSIS — N200 Calculus of kidney: Secondary | ICD-10-CM | POA: Diagnosis present

## 2019-03-04 ENCOUNTER — Ambulatory Visit (INDEPENDENT_AMBULATORY_CARE_PROVIDER_SITE_OTHER): Payer: Medicare Other | Admitting: Urology

## 2019-03-04 DIAGNOSIS — N200 Calculus of kidney: Secondary | ICD-10-CM | POA: Diagnosis not present

## 2019-03-17 ENCOUNTER — Other Ambulatory Visit: Payer: Self-pay

## 2019-03-17 ENCOUNTER — Ambulatory Visit (INDEPENDENT_AMBULATORY_CARE_PROVIDER_SITE_OTHER): Payer: Medicare Other | Admitting: General Surgery

## 2019-03-17 ENCOUNTER — Encounter: Payer: Self-pay | Admitting: General Surgery

## 2019-03-17 VITALS — BP 113/64 | HR 71 | Temp 97.7°F | Resp 18 | Ht 67.0 in | Wt 145.0 lb

## 2019-03-17 DIAGNOSIS — K810 Acute cholecystitis: Secondary | ICD-10-CM | POA: Diagnosis not present

## 2019-03-17 DIAGNOSIS — Z434 Encounter for attention to other artificial openings of digestive tract: Secondary | ICD-10-CM | POA: Diagnosis not present

## 2019-03-17 NOTE — Progress Notes (Signed)
[facility] Note   HPI:  68 y.o. Male presents to clinic for follow-up evaluation of his cholecystostomy tube. He last had it exchanged 12/2018. His caregiver / power of attorney Gaetano Net is here today with him. She reports he is doing well and the tube is draining as expected. All of this tube studies have shown a occluded cystic duct.  He continues to have drainage and Gaetano Net reports it being foul smelling. The patient has recently undergone cystoscopy and stone removal with Dr. Alyson Ingles and did well.  He has been at home now for several months, and is eating and gaining weight back. He remains chronically distended from his chronic dysmotility.  But has regular Bms.   Review of Systems:  Regular Bm Drainage from cholecystostomy tube  All other review of systems: otherwise negative   Vital Signs:  BP 113/64 (BP Location: Left Arm, Patient Position: Sitting, Cuff Size: Normal)   Pulse 71   Temp 97.7 F (36.5 C) (Oral)   Resp 18   Ht 5\' 7"  (1.702 m)   Wt 145 lb (65.8 kg)   SpO2 98%   BMI 22.71 kg/m    Physical Exam:  Physical Exam Vitals signs reviewed.  HENT:     Head: Normocephalic.  Cardiovascular:     Rate and Rhythm: Normal rate.  Pulmonary:     Effort: Pulmonary effort is normal.     Breath sounds: Normal breath sounds.  Abdominal:     General: There is distension.     Palpations: Abdomen is soft.     Tenderness: There is no abdominal tenderness.     Comments: Cholecystostomy tube RUQ, draining brownish fluid into bag  Musculoskeletal: Normal range of motion.        General: No swelling.  Neurological:     Mental Status: He is alert.     Assessment:  68 y.o. yo Male with a chronic cholecystostomy tube in place that has been in there since January 2020 for gangrenous cholecystitis and sepsis. He then suffered a volvulus of the intestine and had an exploration with reduction in May 2020. He was in the SNF for an extended period of time and was admitted  back to the hospital multiple times for distention which is really his normal but concerned the facility. He has been back home in [place] care. She has discussed the option of cholecystectomy with her family and with [facility], and has considered the options. We have discussed the risk of surgery, the risk of an open surgery due to his distention, the tube, and inflammation, and scarring, and discussed the risk of SNF admission, hospitalization, etc. We discussed the normal risk of cholecystectomy including a small chance for bleeding, infection, injury to normal structures (including common bile duct), conversion to open surgery, bile leak, persistent symptoms, evolution of postcholecystectomy diarrhea, need for secondary interventions, anesthesia reaction, cardiopulmonary issues and other risks not specifically detailed here.   Removing the tube without a plan for cholecystectomy is not an option as his cystic duct is occluded, so he would develop cholecystitis again.  The two options are permanent cholecystostomy versus cholecystectomy. Gaetano Net wants to pursue surgery.   We discussed COVID and the new peaks with the holidays, and we are going to postpone for another 3 months. Continue normal IR interventions and exchanges for now. If something changes and the cystic duct some how re-cannulates we can discuss a different course.     Future Appointments  Date Time Provider [place]  03/31/2019 10:00 AM Malissa Hippo, MD N[facility]-N[facility] None  04/06/2019  9:00 AM [facility] 1 [facility]  05/19/2019 10:00 AM [name], MD RS-RS None    All of the above recommendations were discussed with the patient and patient's family, and all of patient's and family's questions were answered to their expressed satisfaction. I spent over 25 minutes with the patient and Aldean Jewett regarding the risk and the upcoming plan for cholecystectomy.   Algis Greenhouse, MD [facility] [address]  [place], [place] [address] [phone] (office)

## 2019-03-17 NOTE — Patient Instructions (Signed)
Laparoscopic Cholecystectomy Laparoscopic cholecystectomy is surgery to remove the gallbladder. The gallbladder is a pear-shaped organ that lies beneath the liver on the right side of the body. The gallbladder stores bile, which is a fluid that helps the body to digest fats. Cholecystectomy is often done for inflammation of the gallbladder (cholecystitis). This condition is usually caused by a buildup of gallstones (cholelithiasis) in the gallbladder. Gallstones can block the flow of bile, which can result in inflammation and pain. In severe cases, emergency surgery may be required. This procedure is done though small incisions in your abdomen (laparoscopic surgery). A thin scope with a camera (laparoscope) is inserted through one incision. Thin surgical instruments are inserted through the other incisions. In some cases, a laparoscopic procedure may be turned into a type of surgery that is done through a larger incision (open surgery). Tell a health care provider about:  Any allergies you have.  All medicines you are taking, including vitamins, herbs, eye drops, creams, and over-the-counter medicines.  Any problems you or family members have had with anesthetic medicines.  Any blood disorders you have.  Any surgeries you have had.  Any medical conditions you have.  Whether you are pregnant or may be pregnant. What are the risks? Generally, this is a safe procedure. However, problems may occur, including:  Infection.  Bleeding.  Allergic reactions to medicines.  Damage to other structures or organs.  A stone remaining in the common bile duct. The common bile duct carries bile from the gallbladder into the small intestine.  A bile leak from the cyst duct that is clipped when your gallbladder is removed. Medicines  Ask your health care provider about: ? Changing or stopping your regular medicines. This is especially important if you are taking diabetes medicines or blood thinners. ?  Taking medicines such as aspirin and ibuprofen. These medicines can thin your blood. Do not take these medicines before your procedure if your health care provider instructs you not to.  You may be given antibiotic medicine to help prevent infection. General instructions  Let your health care provider know if you develop a cold or an infection before surgery.  Plan to have someone take you home from the hospital or clinic.  Ask your health care provider how your surgical site will be marked or identified. What happens during the procedure?   To reduce your risk of infection: ? Your health care team will wash or sanitize their hands. ? Your skin will be washed with soap. ? Hair may be removed from the surgical area.  An IV tube may be inserted into one of your veins.  You will be given one or more of the following: ? A medicine to help you relax (sedative). ? A medicine to make you fall asleep (general anesthetic).  A breathing tube will be placed in your mouth.  Your surgeon will make several small cuts (incisions) in your abdomen.  The laparoscope will be inserted through one of the small incisions. The camera on the laparoscope will send images to a TV screen (monitor) in the operating room. This lets your surgeon see inside your abdomen.  Air-like gas will be pumped into your abdomen. This will expand your abdomen to give the surgeon more room to perform the surgery.  Other tools that are needed for the procedure will be inserted through the other incisions. The gallbladder will be removed through one of the incisions.  Your common bile duct may be examined. If stones are found   in the common bile duct, they may be removed.  After your gallbladder has been removed, the incisions will be closed with stitches (sutures), staples, or skin glue.  Your incisions may be covered with a bandage (dressing). The procedure may vary among health care providers and hospitals. What happens  after the procedure?  Your blood pressure, heart rate, breathing rate, and blood oxygen level will be monitored until the medicines you were given have worn off.  You will be given medicines as needed to control your pain.  Do not drive for 24 hours if you were given a sedative. This information is not intended to replace advice given to you by your health care provider. Make sure you discuss any questions you have with your health care provider. Document Released: 04/02/2005 Document Revised: 03/15/2017 Document Reviewed: 09/19/2015 Elsevier Patient Education  [employer].   Cholecystitis  Cholecystitis is irritation and swelling (inflammation) of the gallbladder. The gallbladder is an organ that is shaped like a pear. It is under the liver on the right side of the body. This organ stores bile. Bile helps the body break down (digest) the fats in food. This condition can occur all of a sudden. It needs to be treated. What are the causes? This condition may be caused by stones or lumps that form in the gallbladder (gallstones). Gallstones can block the tube (duct) that carries bile out of your gallbladder. Other causes are:  Damage to the gallbladder due to less blood flow.  Germs in the bile ducts.  Scars or kinks in the bile ducts.  Abnormal growths (tumors) in the liver, pancreas, or gallbladder. What increases the risk? You are more likely to develop this condition if:  You have sickle cell disease.  You take birth control pills.  You use estrogen.  You have alcoholic liver disease.  You have liver cirrhosis.  You are being fed through a vein.  You are very ill.  You do not eat or drink for a long time. This is also called "fasting."  You are overweight (obese).  You lose weight too fast.  You are pregnant.  You have high levels of fat in the blood (triglycerides).  You have irritation and swelling of the pancreas (pancreatitis). What are the signs or  symptoms? Symptoms of this condition include:  Pain in the belly (abdomen). Pain is often in the upper right area of the belly.  Tenderness or bloating in the belly.  Feeling sick to your stomach (nauseous).  Throwing up (vomiting).  Fever.  Chills. How is this diagnosed? This condition may be diagnosed with a medical history and exam. You may also have other tests, such as:  Imaging tests. This may include: ? Ultrasound. ? CT scan of the belly. ? Nuclear scan. This is also called a HIDA scan. This scan lets your doctor see the bile as it moves in your body. ? MRI.  Blood tests. These are done to check: ? Your blood count. The white blood cell count may be higher than normal. ? How well your liver works. How is this treated? This condition may be treated with:  Surgery to take out your gallbladder.  Antibiotic medicines to treat illnesses caused by germs.  Going without food for some time.  Giving fluids through an IV tube.  Medicines to treat pain or throwing up. Follow these instructions at home:  If you had surgery, follow instructions from your doctor about how to care for yourself after you go  home. Medicines   Take over-the-counter and prescription medicines only as told by your doctor.  If you were prescribed an antibiotic medicine, take it as told by your doctor. Do not stop taking it even if you start to feel better. General instructions  Follow instructions from your doctor about what to eat or drink. Do not eat or drink anything that makes you sick again.  Do not lift anything that is heavier than 10 lb (4.5 kg) until your doctor says that it is safe.  Do not use any products that contain nicotine or tobacco, such as cigarettes and e-cigarettes. If you need help quitting, ask your doctor.  Keep all follow-up visits as told by your doctor. This is important. Contact a doctor if:  You have pain and your medicine does not help.  You have a fever.  Get help right away if:  Your pain moves to: ? Another part of your belly. ? Your back.  Your symptoms do not go away.  You have new symptoms. Summary  Cholecystitis is swelling and irritation of the gallbladder.  This condition may be caused by stones or lumps that form in the gallbladder (gallstones).  Common symptoms are pain in the belly. You may feel sick to your stomach and start throwing up. You may also have a fever and chills.  This condition may be treated with surgery to take out the gallbladder. It may also be treated with medicines, fasting, and fluids through an IV tube.  Follow what you are told about eating and drinking. Do not eat things that make you sick again. This information is not intended to replace advice given to you by your health care provider. Make sure you discuss any questions you have with your health care provider. Document Released: 03/22/2011 Document Revised: 08/09/2017 Document Reviewed: 08/09/2017 Elsevier Patient Education  2020 [employer].

## 2019-03-31 ENCOUNTER — Ambulatory Visit (HOSPITAL_COMMUNITY): Payer: Medicare Other

## 2019-03-31 ENCOUNTER — Other Ambulatory Visit: Payer: Self-pay

## 2019-03-31 ENCOUNTER — Ambulatory Visit (INDEPENDENT_AMBULATORY_CARE_PROVIDER_SITE_OTHER): Payer: Medicare Other | Admitting: Internal Medicine

## 2019-03-31 ENCOUNTER — Encounter (INDEPENDENT_AMBULATORY_CARE_PROVIDER_SITE_OTHER): Payer: Self-pay | Admitting: Internal Medicine

## 2019-03-31 VITALS — BP 112/77 | HR 80 | Temp 97.5°F | Ht 67.0 in | Wt 151.3 lb

## 2019-03-31 DIAGNOSIS — K5939 Other megacolon: Secondary | ICD-10-CM

## 2019-03-31 DIAGNOSIS — K5909 Other constipation: Secondary | ICD-10-CM | POA: Diagnosis not present

## 2019-03-31 MED ORDER — LINACLOTIDE 145 MCG PO CAPS: 145.0000 ug | ORAL_CAPSULE | Freq: Every day | ORAL | 5 refills | Status: DC

## 2019-03-31 NOTE — Patient Instructions (Signed)
Can use Dulcolax suppository every second or third day on as-needed basis. Please encouraged patient to sit in a commode for 5 to 10 minutes every night and see if he could expel flatus. Please call if Linzess/linaclotide does not work. Progress report in 2 to 3 months.

## 2019-03-31 NOTE — Progress Notes (Signed)
Presenting complaint;  Follow for abdominal distention and constipation.  Database and subjective:  Patient is 68 year old male who is here for scheduled visit accompanied by his aunt Ms. [name] who also is his caregiver. Patient has over 2-year history of abdominal distention and intermittent diarrhea.  In February this year he presented with acute cholecystitis leading to cholecystostomy.  He still has a tube in place it is being changed at regular intervals by IR and Dr. Constance Haw is planning to remove his gallbladder possibly in March when Covid pandemic is under control. Patient underwent laparotomy by Dr. Constance Haw on 08/25/2018 for for what was felt to be bowel obstruction and he had pneumatosis.  He was found to have small bowel volvulus which was reduced.  Abdominal distention worsened postop and he did not respond to usual measures.  I saw him in consultation and started him on prucalopride and he improved.  He was able to go home.  He was last seen by Ms. Setzer NP on 09/24/2018 in appear to be stable.  History is provided by patient's aunt Ms. [name].  His abdominal distention is up and down.  She says at times his abdomen is as tight as a basketball.  His bowels are not moving regularly.  She is having to give him 2 ounces of Dulcolax liquid daily.  He may have a bowel movement every every 2 to 3 days.  Stool is loose.  He is also passing flatus intermittently.  He has not experienced nausea or vomiting.  She is also giving him simethicone on an as-needed basis.  She says sometimes he would gag when he is given Dulcolax liquid but he has not had any vomiting.  She also has been using Dulcolax suppository which seem to help. He is scheduled to have his cholecystostomy tube change later this month. He remains with good appetite.  He has not lost any weight. He is ambulating with walker.  He does walk some in the house.  She states that he had no difficulty walking until he was  hospitalized with acute cholecystitis. He generally responds by saying yes and no and also by his expression and gestures.  Current Medications: Outpatient Encounter Medications as of 03/31/2019  Medication Sig  . acetaminophen (TYLENOL) 500 MG tablet Take 1,000 mg by mouth every 6 (six) hours as needed for mild pain or headache.  Marland Kitchen aspirin EC 81 MG tablet Take 81 mg by mouth every morning.   Marland Kitchen atorvastatin (LIPITOR) 20 MG tablet Take 20 mg by mouth at bedtime.  . magnesium oxide (MAG-OX) 400 MG tablet Take 1 tablet (400 mg total) by mouth daily.  . metFORMIN (GLUCOPHAGE) 500 MG tablet Take 2 tablets (1,000 mg total) by mouth every morning. (Patient taking differently: Take 500 mg by mouth 2 (two) times daily with a meal. )  . simethicone (MYLICON) 40 HT/3.4KA drops Take 0.6 mLs (40 mg total) by mouth 4 (four) times daily.  . tamsulosin (FLOMAX) 0.4 MG CAPS capsule Take 1 capsule (0.4 mg total) by mouth every evening.  . finasteride (PROSCAR) 5 MG tablet Take 1 tablet (5 mg total) by mouth daily. (Patient not taking: Reported on 03/31/2019)  . HYDROcodone-acetaminophen (NORCO) 5-325 MG tablet Take 1 tablet by mouth every 4 (four) hours as needed for moderate pain. (Patient not taking: Reported on 03/31/2019)  . simvastatin (ZOCOR) 40 MG tablet Take 40 mg by mouth daily.  . [DISCONTINUED] MAGNESIUM-OXIDE 400 (241.3 Mg) MG tablet Take 1 tablet by mouth  daily.  . [DISCONTINUED] ondansetron (ZOFRAN ODT) 4 MG disintegrating tablet 46m ODT q4 hours prn nausea/vomit (Patient not taking: Reported on 03/31/2019)   No facility-administered encounter medications on file as of 03/31/2019.     Objective: Blood pressure 112/77, pulse 80, temperature (!) 97.5 F (36.4 C), temperature source Oral, height _0  (1.702 m), weight 151 lb 4.8 oz (68.6 kg). Patient is alert and in no acute distress. He is wearing facial mask. He does seem to understand questions and responds appropriately such as take a deep  breath in and out as well as look up or down. Conjunctiva is pink. Sclera is nonicteric Oropharyngeal mucosa is normal. No neck masses or thyromegaly noted. Cardiac exam with regular rhythm normal S1 and S2. No murmur or gallop noted. Lungs are clear to auscultation. Abdomen is distended but not tight.  He has cholecystostomy tube with entry site laterally between lower intercostal space.  There is no drainage at site.  Abdomen is soft and it is very tympanic on percussion.  No organomegaly or masses noted. No LE edema or clubbing noted.  Labs/studies Results:  CBC Latest Ref Rng & Units 12/25/2018 10/14/2018 09/29/2018  WBC 4.0 - 10.5 K/uL 11.2(H) 5.4 4.9  Hemoglobin 13.0 - 17.0 g/dL 11.0(L) 9.7(L) 9.3(L)  Hematocrit 39.0 - 52.0 % 36.8(L) 31.0(L) 29.9(L)  Platelets 150 - 400 K/uL 239 213 200    CMP Latest Ref Rng & Units 12/25/2018 09/29/2018 09/25/2018  Glucose 70 - 99 mg/dL 189(H) 121(H) 216(H)  BUN 8 - 23 mg/dL _1 Creatinine 0.61 - 1.24 mg/dL 0.86 0.97 1.02  Sodium 135 - 145 mmol/L 135 137 133(L)  Potassium 3.5 - 5.1 mmol/L 3.8 4.1 3.2(L)  Chloride 98 - 111 mmol/L 102 101 97(L)  CO2 22 - 32 mmol/L _2 Calcium 8.9 - 10.3 mg/dL 9.2 9.3 9.6  Total Protein 6.5 - 8.1 g/dL 7.5 - 8.1  Total Bilirubin 0.3 - 1.2 mg/dL 0.1(L) - 0.4  Alkaline Phos 38 - 126 U/L 59 - 66  AST 15 - 41 U/L 10(L) - 24  ALT 0 - 44 U/L 10 - 30    Hepatic Function Latest Ref Rng & Units 12/25/2018 09/25/2018 09/20/2018  Total Protein 6.5 - 8.1 g/dL 7.5 8.1 7.4  Albumin 3.5 - 5.0 g/dL 3.4(L) 4.0 3.5  AST 15 - 41 U/L 10(L) 24 22  ALT 0 - 44 U/L _3 Alk Phosphatase 38 - 126 U/L 59 66 57  Total Bilirubin 0.3 - 1.2 mg/dL 0.1(L) 0.4 0.3  Bilirubin, Direct 0.0 - 0.2 mg/dL - - -      Assessment:  #1.  Chronic abdominal distention most likely due to megacolon and chronic constipation.  Polyethylene glycol is not working anymore.  Unfortunately prucalopride not covered.  Therefore we will try him on  linaclotide and see how he does.  #2.  Anemia appears to be chronic disease anemia.  No evidence of overt GI bleed.   Plan:  Patient will avoid using Dulcolax liquid. Begin Linzess/linaclotide 145 mcg by mouth every morning.  2-week supply given and prescription sent to patient's pharmacy.  It remains to be seen if it will be covered and cost effective. Use Dulcolax suppository every second or third day on as-needed basis. Encourage patient to try to have a bowel movement every morning and also sit on commode for few minutes before he goes to bed. Progress report and it is 12 weeks and office visit in  6 months.

## 2019-04-06 ENCOUNTER — Inpatient Hospital Stay (HOSPITAL_COMMUNITY): Admission: RE | Admit: 2019-04-06 | Payer: Medicare Other | Source: Ambulatory Visit

## 2019-04-06 ENCOUNTER — Other Ambulatory Visit: Payer: Self-pay | Admitting: Radiology

## 2019-04-06 ENCOUNTER — Ambulatory Visit (HOSPITAL_COMMUNITY): Admission: RE | Admit: 2019-04-06 | Payer: Medicare Other | Source: Ambulatory Visit

## 2019-04-07 ENCOUNTER — Other Ambulatory Visit (HOSPITAL_COMMUNITY): Payer: Self-pay | Admitting: Interventional Radiology

## 2019-04-07 ENCOUNTER — Other Ambulatory Visit: Payer: Self-pay

## 2019-04-07 ENCOUNTER — Ambulatory Visit (HOSPITAL_COMMUNITY)
Admission: RE | Admit: 2019-04-07 | Discharge: 2019-04-07 | Disposition: A | Discharge status: Home / Self Care | Payer: Medicare Other | Source: Ambulatory Visit | Attending: Interventional Radiology | Admitting: Interventional Radiology

## 2019-04-07 DIAGNOSIS — Z0000 Encounter for general adult medical examination without abnormal findings: Secondary | ICD-10-CM

## 2019-04-07 DIAGNOSIS — Z4803 Encounter for change or removal of drains: Secondary | ICD-10-CM | POA: Diagnosis present

## 2019-04-07 HISTORY — PX: IR EXCHANGE BILIARY DRAIN: IMG6046

## 2019-04-07 MED ORDER — IOHEXOL 300 MG/ML  SOLN: 50.0000 mL | Freq: Once | INTRAMUSCULAR | Status: DC | PRN

## 2019-04-07 MED ORDER — IOHEXOL 300 MG/ML  SOLN
50.0000 mL | Freq: Once | INTRAMUSCULAR | Status: AC | PRN
  Administered 2019-04-07: 16:00:00 10 mL

## 2019-04-07 MED ORDER — LIDOCAINE HCL 1 % IJ SOLN
INTRAMUSCULAR | Status: AC
  Filled 2019-04-07: qty 20

## 2019-04-27 ENCOUNTER — Other Ambulatory Visit: Payer: Self-pay

## 2019-04-27 DIAGNOSIS — N200 Calculus of kidney: Secondary | ICD-10-CM

## 2019-05-19 ENCOUNTER — Encounter: Payer: Self-pay | Admitting: General Surgery

## 2019-05-19 ENCOUNTER — Other Ambulatory Visit: Payer: Self-pay

## 2019-05-19 ENCOUNTER — Ambulatory Visit (INDEPENDENT_AMBULATORY_CARE_PROVIDER_SITE_OTHER): Payer: Medicare Other | Admitting: General Surgery

## 2019-05-19 VITALS — BP 120/67 | HR 82 | Resp 16 | Ht 67.0 in | Wt 149.0 lb

## 2019-05-19 DIAGNOSIS — Z434 Encounter for attention to other artificial openings of digestive tract: Secondary | ICD-10-CM | POA: Diagnosis not present

## 2019-05-19 DIAGNOSIS — K811 Chronic cholecystitis: Secondary | ICD-10-CM

## 2019-05-19 NOTE — Patient Instructions (Signed)
Laparoscopic Cholecystectomy Laparoscopic cholecystectomy is surgery to remove the gallbladder. The gallbladder is a pear-shaped organ that lies beneath the liver on the right side of the body. The gallbladder stores bile, which is a fluid that helps the body to digest fats. Cholecystectomy is often done for inflammation of the gallbladder (cholecystitis). This condition is usually caused by a buildup of gallstones (cholelithiasis) in the gallbladder. Gallstones can block the flow of bile, which can result in inflammation and pain. In severe cases, emergency surgery may be required. This procedure is done though small incisions in your abdomen (laparoscopic surgery). A thin scope with a camera (laparoscope) is inserted through one incision. Thin surgical instruments are inserted through the other incisions. In some cases, a laparoscopic procedure may be turned into a type of surgery that is done through a larger incision (open surgery). Tell a health care provider about:  Any allergies you have.  All medicines you are taking, including vitamins, herbs, eye drops, creams, and over-the-counter medicines.  Any problems you or family members have had with anesthetic medicines.  Any blood disorders you have.  Any surgeries you have had.  Any medical conditions you have.  Whether you are pregnant or may be pregnant. What are the risks? Generally, this is a safe procedure. However, problems may occur, including:  Infection.  Bleeding.  Allergic reactions to medicines.  Damage to other structures or organs.  A stone remaining in the common bile duct. The common bile duct carries bile from the gallbladder into the small intestine.  A bile leak from the cyst duct that is clipped when your gallbladder is removed. Medicines  Ask your health care provider about: ? Changing or stopping your regular medicines. This is especially important if you are taking diabetes medicines or blood  thinners. ? Taking medicines such as aspirin and ibuprofen. These medicines can thin your blood. Do not take these medicines before your procedure if your health care provider instructs you not to.  You may be given antibiotic medicine to help prevent infection. General instructions  Let your health care provider know if you develop a cold or an infection before surgery.  Plan to have someone take you home from the hospital or clinic.  Ask your health care provider how your surgical site will be marked or identified. What happens during the procedure?   To reduce your risk of infection: ? Your health care team will wash or sanitize their hands. ? Your skin will be washed with soap. ? Hair may be removed from the surgical area.  An IV tube may be inserted into one of your veins.  You will be given one or more of the following: ? A medicine to help you relax (sedative). ? A medicine to make you fall asleep (general anesthetic).  A breathing tube will be placed in your mouth.  Your surgeon will make several small cuts (incisions) in your abdomen.  The laparoscope will be inserted through one of the small incisions. The camera on the laparoscope will send images to a TV screen (monitor) in the operating room. This lets your surgeon see inside your abdomen.  Air-like gas will be pumped into your abdomen. This will expand your abdomen to give the surgeon more room to perform the surgery.  Other tools that are needed for the procedure will be inserted through the other incisions. The gallbladder will be removed through one of the incisions.  Your common bile duct may be examined. If stones are found  in the common bile duct, they may be removed.  After your gallbladder has been removed, the incisions will be closed with stitches (sutures), staples, or skin glue.  Your incisions may be covered with a bandage (dressing). The procedure may vary among health care providers and  hospitals. What happens after the procedure?  Your blood pressure, heart rate, breathing rate, and blood oxygen level will be monitored until the medicines you were given have worn off.  You will be given medicines as needed to control your pain.  Do not drive for 24 hours if you were given a sedative. This information is not intended to replace advice given to you by your health care provider. Make sure you discuss any questions you have with your health care provider. Document Revised: 03/15/2017 Document Reviewed: 09/19/2015 Elsevier Patient Education  2020 [place] is a procedure to drain fluid from the gallbladder by using a flexible drainage tube (catheter). The gallbladder is a pear-shaped organ that lies beneath the liver on the right side of the body. The gallbladder stores bile, which is a fluid that helps the body digest fats. You may have this procedure:  If your gallbladder is infected due to gallstones (cholecystitis).  To control a gallbladder infection if you cannot have gallbladder surgery. Tell a health care provider about:  Any allergies you have.  All medicines you are taking, including vitamins, herbs, eye drops, creams, and over-the-counter medicines.  Any problems you or family members have had with anesthetic medicines.  Any blood disorders you have.  Any surgeries you have had.  Any medical conditions you have.  Whether you are pregnant or may be pregnant. What are the risks? Generally, this is a safe procedure. However, problems may occur, including:  The catheter moving out of place.  Clogging of the catheter.  Infection of the incision site.  Internal bleeding.  Leakage of bile from the gallbladder.  Infection inside the abdomen (peritonitis).  Damage to other structures or organs.  Low blood pressure and slowed heart rate.  Allergic reactions to medicines or dyes. What happens before the  procedure? Most often, you will already be in the hospital receiving treatment for a gallbladder infection or other problems with the gallbladder. Medicines  Ask your health care provider about: ? Changing or stopping your regular medicines. This is especially important if you are taking diabetes medicines or blood thinners. ? Taking medicines such as aspirin and ibuprofen. These medicines can thin your blood. Do not take these medicines unless your health care provider tells you to take them. ? Taking over-the-counter medicines, vitamins, herbs, and supplements. General instructions  You may have an exam or testing, including: ? Imaging studies of your gallbladder. ? Blood tests.  Do not use any products that contain nicotine or tobacco before the procedure. These products include cigarettes, e-cigarettes, and chewing tobacco. If you need help quitting, ask your health care provider. Also, do not use these products after the procedure.  Plan to have someone take you home from the hospital or clinic.  If you will be going home right after the procedure, plan to have someone with you for 24 hours.  Ask your health care provider how your surgical site will be marked or identified.  Ask your health care provider what steps will be taken to help prevent infection. These may include: ? Removing hair at the surgery site. ? Washing skin with a germ-killing soap. ? Taking antibiotic medicine. What happens during  the procedure?  An IV will be inserted into one of your veins.  You will be given one or more of the following: ? A medicine to help you relax (sedative). ? A medicine to numb the area (local anesthetic).  A small incision will be made in your abdomen.  A long needle or a wide puncturing tool (trocar) will be put through the incision.  Your health care provider will use an imaging study (ultrasound) to guide the needle or trocar into your gallbladder.  After the needle or  trocar is in your gallbladder, a small amount of dye may be injected. An X-ray may be taken to make sure that the needle is in the correct place.  A catheter will be placed through the needle.  The needle or trocar will be removed.  The catheter will be secured to your skin with stitches (sutures).  The catheter will be connected to a drainage bag. Fluid will drain from the gallbladder into the bag. Some of this bile may be sent to the lab to be tested.  A bandage (dressing) will be placed over the incision site where the catheter was placed. The procedure may vary among health care providers and hospitals. What happens after the procedure?  Your blood pressure, heart rate, breathing rate, and blood oxygen level will be monitored until you leave the hospital or clinic.  Dye may be injected through your catheter to check the catheter and your gallbladder.  Your gallbladder may be flushed out (irrigated) through the catheter.  Your catheter and drainage bag may need to stay in place for several weeks or as told by your health care provider. Summary  Cholecystostomy is a procedure to drain fluid from the gallbladder using a flexible drainage tube (catheter).  Generally, this is a safe procedure. However, problems may occur, including bleeding, infection, clogging of the catheter, damage to other structures or organs, or low blood pressure and slowed heart rate.  Follow instructions before the procedure. You will be told when to stop all food and drink, whether to change or stop any medicines, and what tests need to be done.  After the procedure, you will be monitored in the hospital, and you may have a catheter and drainage bag when you go home. This information is not intended to replace advice given to you by your health care provider. Make sure you discuss any questions you have with your health care provider. Document Revised: 10/28/2017 Document Reviewed: 10/28/2017 Elsevier Patient  Education  2020 [employer].

## 2019-05-19 NOTE — Progress Notes (Signed)
[facility] History and Physical  Reason for Referral: Cholecystostomy tube in place, chronic cholecystitis    Chief Complaint    cholecystostomy care      [name] is a 69 y.o. male.  HPI: [name] is a very sweet 69 yo who has been followed by me for several months. He had a colonic volvulus back in the spring and had a reduction but required no resection. He has chronic distention due to intestinal dysmotility, and had a very complicated and long post operative course due to going to a SNF and having the SNF send him back multiple times to the hospital for distention that is truly related to his chronic dysmotility.  He had previously had acute cholecystitis with sepsis January 2020 and had a cholecystectomy tube placed at that time as he was too sick for surgery. At the time of my emergency operation, I did not think that taking the gallbladder out was the best option.  He has been doing well with this cholecystectomy drain since it was replaced and has gone for exchanges. The cystic duct remains occluded, so he has not re-cannulated.  The patient has been back with his guardian, [name] for months now and is back to his baseline and is no longer losing weight.   Past Medical History:  Diagnosis Date  . Diabetes mellitus without complication (HCC)   . History of gout   . History of kidney stones   . Hypertension   . Mentally challenged   . Urinary retention     Past Surgical History:  Procedure Laterality Date  . CYSTOSCOPY W/ URETERAL STENT PLACEMENT Bilateral 06/20/2016   Procedure: CYSTOSCOPY WITH RETROGRADE PYELOGRAM/URETERAL STENT PLACEMENT;  Surgeon: Levi Gauze, MD;  Location: [facility];  Service: Urology;  Laterality: Bilateral;  . CYSTOSCOPY WITH RETROGRADE PYELOGRAM, URETEROSCOPY AND STENT PLACEMENT Bilateral 08/20/2016   Procedure: CYSTOSCOPY WITH BILATERAL RETROGRADE PYELOGRAM, BILATERAL URETEROSCOPY,  LASER LITHOTRIPSY OF RIGHT URETERAL  CALCULI, STONE BASKET EXTRACTION LEFT URETERAL CALCULI  AND BILATERAL URETERAL STENT EXCHANGE;  Surgeon: Levi Gauze, MD;  Location: [facility];  Service: Urology;  Laterality: Bilateral;  . CYSTOSCOPY WITH RETROGRADE PYELOGRAM, URETEROSCOPY AND STENT PLACEMENT Left 01/14/2019   Procedure: CYSTOSCOPY WITH LEFT RETROGRADE PYELOGRAM, LEFT URETEROSCOPY AND LEFT URETERAL STENT PLACEMENT;  Surgeon: Levi Gauze, MD;  Location: [facility];  Service: Urology;  Laterality: Left;  . CYSTOSCOPY WITH URETHRAL DILATATION  01/14/2019   Procedure: CYSTOSCOPY WITH URETHRAL DILATATION;  Surgeon: Levi Gauze, MD;  Location: [facility];  Service: Urology;;  . HOLMIUM LASER APPLICATION Bilateral 08/20/2016   Procedure: HOLMIUM LASER APPLICATION;  Surgeon: Levi Gauze, MD;  Location: [facility];  Service: Urology;  Laterality: Bilateral;  . HOLMIUM LASER APPLICATION Left 01/14/2019   Procedure: HOLMIUM LASER APPLICATION;  Surgeon: Levi Gauze, MD;  Location: [facility];  Service: Urology;  Laterality: Left;  . IR CATHETER TUBE CHANGE  07/04/2018  . IR EXCHANGE BILIARY DRAIN  06/09/2018  . IR EXCHANGE BILIARY DRAIN  10/20/2018  . IR EXCHANGE BILIARY DRAIN  01/12/2019  . IR EXCHANGE BILIARY DRAIN  04/07/2019  . IR PERC CHOLECYSTOSTOMY  05/24/2018  . LAPAROTOMY N/A 08/25/2018   Procedure: EXPLORATORY LAPAROTOMY reduction of volvulus;  Surgeon: Levi Roers, MD;  Location: [facility];  Service: General;  Laterality: N/A;    Family History  Problem Relation Age of Onset  . Gallbladder disease Other     Social History   Tobacco Use  .  Smoking status: Never Smoker  . Smokeless tobacco: Never Used  Substance Use Topics  . Alcohol use: No  . Drug use: No    Medications: I have reviewed the patient's current medications. Allergies as of 05/19/2019   No Known Allergies     Medication List       Accurate as of May 19, 2019 10:04 AM. If you have any questions, ask your nurse or doctor.          acetaminophen 500 MG tablet Commonly known as: TYLENOL Take 1,000 mg by mouth every 6 (six) hours as needed for mild pain or headache.   aspirin EC 81 MG tablet Take 81 mg by mouth every morning.   atorvastatin 20 MG tablet Commonly known as: LIPITOR Take 20 mg by mouth at bedtime.   finasteride 5 MG tablet Commonly known as: PROSCAR Take 1 tablet (5 mg total) by mouth daily.   HYDROcodone-acetaminophen 5-325 MG tablet Commonly known as: Norco Take 1 tablet by mouth every 4 (four) hours as needed for moderate pain.   linaclotide 145 MCG Caps capsule Commonly known as: LINZESS Take 1 capsule (145 mcg total) by mouth daily before breakfast.   magnesium oxide 400 MG tablet Commonly known as: MAG-OX Take 1 tablet (400 mg total) by mouth daily.   metFORMIN 500 MG tablet Commonly known as: GLUCOPHAGE Take 2 tablets (1,000 mg total) by mouth every morning. What changed:   how much to take  when to take this   simethicone 40 MG/0.6ML drops Commonly known as: Mylicon Take 0.6 mLs (40 mg total) by mouth 4 (four) times daily.   simvastatin 40 MG tablet Commonly known as: ZOCOR Take 40 mg by mouth daily.   tamsulosin 0.4 MG Caps capsule Commonly known as: FLOMAX Take 1 capsule (0.4 mg total) by mouth every evening.        ROS:  A comprehensive review of systems was negative except for: Gastrointestinal: positive for cholecystostomy tube in place  Blood pressure 120/67, pulse 82, resp. rate 16, height 5\' 7"  (1.702 m), weight 149 lb (67.6 kg), SpO2 98 %. Physical Exam Vitals reviewed.  Constitutional:      Appearance: He is normal weight.  HENT:     Head: Normocephalic.     Nose: Nose normal.     Mouth/Throat:     Mouth: Mucous membranes are moist.  Eyes:     Extraocular Movements: Extraocular movements intact.     Pupils: Pupils are equal, round, and reactive to light.  Cardiovascular:     Rate and Rhythm: Normal rate.  Pulmonary:     Effort: Pulmonary  effort is normal.     Breath sounds: Normal breath sounds.  Abdominal:     General: There is distension.     Palpations: Abdomen is soft.     Tenderness: There is no abdominal tenderness.     Hernia: No hernia is present.     Comments: Cholecystostomy tube in place  Musculoskeletal:        General: No swelling. Normal range of motion.     Cervical back: Normal range of motion. No rigidity.  Skin:    General: Skin is warm and dry.  Neurological:     General: No focal deficit present.     Mental Status: He is alert. Mental status is at baseline.  Psychiatric:        Mood and Affect: Mood normal.        Behavior: Behavior normal.  Results: None  Assessment & Plan:  [name] is a 69 y.o. male with chronic cholecystostomy tube in place and chronic cholecystitis with occluded cystic duct on IR imaging for exchange of the tube.  He has had this tube for some time and his guardian [name] wants to pursue a cholecystectomy now that he is back to his baseline. She does not want him to live with this tube forever. She understands the risk of an open procedure and that he could require hospitalization and SNF which could ultimately have the same issues with his distention and repeat hospitalization if he goes to the SNF. Best case scenario is this gallbladder is removed laparoscopically and he goes home with [name].   PLAN: I counseled the patient and his guardian about the indication, risks and benefits of laparoscopic cholecystectomy.  They understand there is a chance for bleeding, infection, injury to normal structures (including common bile duct), conversion to open surgery, persistent symptoms, evolution of postcholecystectomy diarrhea, need for secondary interventions, anesthesia reaction, cardiopulmonary issues and other risks not specifically detailed here. I described the expected recovery, the plan for follow-up and the restrictions during the recovery phase.  All questions were  answered.   Virl Cagey 05/19/2019, 10:04 AM

## 2019-05-21 DIAGNOSIS — K811 Chronic cholecystitis: Secondary | ICD-10-CM | POA: Insufficient documentation

## 2019-05-21 NOTE — H&P (Signed)
[facility] History and Physical  Reason for Referral: Cholecystostomy tube in place, chronic cholecystitis    Chief Complaint    cholecystostomy care      [name] is a 68 y.o. male.  HPI: [name] is a very sweet 68 yo who has been followed by me for several months. He had a colonic volvulus back in the spring and had a reduction but required no resection. He has chronic distention due to intestinal dysmotility, and had a very complicated and long post operative course due to going to a SNF and having the SNF send him back multiple times to the hospital for distention that is truly related to his chronic dysmotility.  He had previously had acute cholecystitis with sepsis January 2020 and had a cholecystectomy tube placed at that time as he was too sick for surgery. At the time of my emergency operation, I did not think that taking the gallbladder out was the best option.  He has been doing well with this cholecystectomy drain since it was replaced and has gone for exchanges. The cystic duct remains occluded, so he has not re-cannulated.  The patient has been back with his guardian, [name] for months now and is back to his baseline and is no longer losing weight.   Past Medical History:  Diagnosis Date  . Diabetes mellitus without complication (HCC)   . History of gout   . History of kidney stones   . Hypertension   . Mentally challenged   . Urinary retention     Past Surgical History:  Procedure Laterality Date  . CYSTOSCOPY W/ URETERAL STENT PLACEMENT Bilateral 06/20/2016   Procedure: CYSTOSCOPY WITH RETROGRADE PYELOGRAM/URETERAL STENT PLACEMENT;  Surgeon: Patrick L McKenzie, MD;  Location: [facility];  Service: Urology;  Laterality: Bilateral;  . CYSTOSCOPY WITH RETROGRADE PYELOGRAM, URETEROSCOPY AND STENT PLACEMENT Bilateral 08/20/2016   Procedure: CYSTOSCOPY WITH BILATERAL RETROGRADE PYELOGRAM, BILATERAL URETEROSCOPY,  LASER LITHOTRIPSY OF RIGHT URETERAL  CALCULI, STONE BASKET EXTRACTION LEFT URETERAL CALCULI  AND BILATERAL URETERAL STENT EXCHANGE;  Surgeon: McKenzie, Patrick L, MD;  Location: [facility];  Service: Urology;  Laterality: Bilateral;  . CYSTOSCOPY WITH RETROGRADE PYELOGRAM, URETEROSCOPY AND STENT PLACEMENT Left 01/14/2019   Procedure: CYSTOSCOPY WITH LEFT RETROGRADE PYELOGRAM, LEFT URETEROSCOPY AND LEFT URETERAL STENT PLACEMENT;  Surgeon: McKenzie, Patrick L, MD;  Location: [facility];  Service: Urology;  Laterality: Left;  . CYSTOSCOPY WITH URETHRAL DILATATION  01/14/2019   Procedure: CYSTOSCOPY WITH URETHRAL DILATATION;  Surgeon: McKenzie, Patrick L, MD;  Location: [facility];  Service: Urology;;  . HOLMIUM LASER APPLICATION Bilateral 08/20/2016   Procedure: HOLMIUM LASER APPLICATION;  Surgeon: McKenzie, Patrick L, MD;  Location: [facility];  Service: Urology;  Laterality: Bilateral;  . HOLMIUM LASER APPLICATION Left 01/14/2019   Procedure: HOLMIUM LASER APPLICATION;  Surgeon: McKenzie, Patrick L, MD;  Location: [facility];  Service: Urology;  Laterality: Left;  . IR CATHETER TUBE CHANGE  07/04/2018  . IR EXCHANGE BILIARY DRAIN  06/09/2018  . IR EXCHANGE BILIARY DRAIN  10/20/2018  . IR EXCHANGE BILIARY DRAIN  01/12/2019  . IR EXCHANGE BILIARY DRAIN  04/07/2019  . IR PERC CHOLECYSTOSTOMY  05/24/2018  . LAPAROTOMY N/A 08/25/2018   Procedure: EXPLORATORY LAPAROTOMY reduction of volvulus;  Surgeon: Emari Hreha C, MD;  Location: [facility];  Service: General;  Laterality: N/A;    Family History  Problem Relation Age of Onset  . Gallbladder disease Other     Social History   Tobacco Use  .   Smoking status: Never Smoker  . Smokeless tobacco: Never Used  Substance Use Topics  . Alcohol use: No  . Drug use: No    Medications: I have reviewed the patient's current medications. Allergies as of 05/19/2019   No Known Allergies     Medication List       Accurate as of May 19, 2019 10:04 AM. If you have any questions, ask your nurse or doctor.          acetaminophen 500 MG tablet Commonly known as: TYLENOL Take 1,000 mg by mouth every 6 (six) hours as needed for mild pain or headache.   aspirin EC 81 MG tablet Take 81 mg by mouth every morning.   atorvastatin 20 MG tablet Commonly known as: LIPITOR Take 20 mg by mouth at bedtime.   finasteride 5 MG tablet Commonly known as: PROSCAR Take 1 tablet (5 mg total) by mouth daily.   HYDROcodone-acetaminophen 5-325 MG tablet Commonly known as: Norco Take 1 tablet by mouth every 4 (four) hours as needed for moderate pain.   linaclotide 145 MCG Caps capsule Commonly known as: LINZESS Take 1 capsule (145 mcg total) by mouth daily before breakfast.   magnesium oxide 400 MG tablet Commonly known as: MAG-OX Take 1 tablet (400 mg total) by mouth daily.   metFORMIN 500 MG tablet Commonly known as: GLUCOPHAGE Take 2 tablets (1,000 mg total) by mouth every morning. What changed:   how much to take  when to take this   simethicone 40 MG/0.6ML drops Commonly known as: Mylicon Take 0.6 mLs (40 mg total) by mouth 4 (four) times daily.   simvastatin 40 MG tablet Commonly known as: ZOCOR Take 40 mg by mouth daily.   tamsulosin 0.4 MG Caps capsule Commonly known as: FLOMAX Take 1 capsule (0.4 mg total) by mouth every evening.        ROS:  A comprehensive review of systems was negative except for: Gastrointestinal: positive for cholecystostomy tube in place  Blood pressure 120/67, pulse 82, resp. rate 16, height 5' 7" (1.702 m), weight 149 lb (67.6 kg), SpO2 98 %. Physical Exam Vitals reviewed.  Constitutional:      Appearance: He is normal weight.  HENT:     Head: Normocephalic.     Nose: Nose normal.     Mouth/Throat:     Mouth: Mucous membranes are moist.  Eyes:     Extraocular Movements: Extraocular movements intact.     Pupils: Pupils are equal, round, and reactive to light.  Cardiovascular:     Rate and Rhythm: Normal rate.  Pulmonary:     Effort: Pulmonary  effort is normal.     Breath sounds: Normal breath sounds.  Abdominal:     General: There is distension.     Palpations: Abdomen is soft.     Tenderness: There is no abdominal tenderness.     Hernia: No hernia is present.     Comments: Cholecystostomy tube in place  Musculoskeletal:        General: No swelling. Normal range of motion.     Cervical back: Normal range of motion. No rigidity.  Skin:    General: Skin is warm and dry.  Neurological:     General: No focal deficit present.     Mental Status: He is alert. Mental status is at baseline.  Psychiatric:        Mood and Affect: Mood normal.        Behavior: Behavior normal.       Results: None  Assessment & Plan:  [name] is a 68 y.o. male with chronic cholecystostomy tube in place and chronic cholecystitis with occluded cystic duct on IR imaging for exchange of the tube.  He has had this tube for some time and his guardian [name] wants to pursue a cholecystectomy now that he is back to his baseline. She does not want him to live with this tube forever. She understands the risk of an open procedure and that he could require hospitalization and SNF which could ultimately have the same issues with his distention and repeat hospitalization if he goes to the SNF. Best case scenario is this gallbladder is removed laparoscopically and he goes home with [name].   PLAN: I counseled the patient and his guardian about the indication, risks and benefits of laparoscopic cholecystectomy.  They understand there is a chance for bleeding, infection, injury to normal structures (including common bile duct), conversion to open surgery, persistent symptoms, evolution of postcholecystectomy diarrhea, need for secondary interventions, anesthesia reaction, cardiopulmonary issues and other risks not specifically detailed here. I described the expected recovery, the plan for follow-up and the restrictions during the recovery phase.  All questions were  answered.   Talya Quain C Viktoria Gruetzmacher 05/19/2019, 10:04 AM       

## 2019-05-22 NOTE — Patient Instructions (Signed)
[name]  05/22/2019     @PREFPERIOPPHARMACY @   Your procedure is scheduled on  06/01/2019 .  Report to 06/03/2019 at  [phone]  A.M.  Call this number if you have problems the morning of surgery:  [phone]   Remember:  Do not eat or drink after midnight.                     Take these medicines the morning of surgery with A SIP OF WATER  Proscar, hydrocodone(if needed), flomax. DO NOT take any medications for diabetes the morning of your procedure.    Do not wear jewelry, make-up or nail polish.  Do not wear lotions, powders, or perfumes. Please wear deodorant and brush your teeth.  Do not shave 48 hours prior to surgery.  Men may shave face and neck.  Do not bring valuables to the hospital.  [facility] is not responsible for any belongings or valuables.  Contacts, dentures or bridgework may not be worn into surgery.  Leave your suitcase in the car.  After surgery it may be brought to your room.  For patients admitted to the hospital, discharge time will be determined by your treatment team.  Patients discharged the day of surgery will not be allowed to drive home.   Name and phone number of your driver:  Family Special instructions:  None  Please read over the following fact sheets that you were given. Anesthesia Post-op Instructions and Care and Recovery After Surgery       Laparoscopic Cholecystectomy, Care After This sheet gives you information about how to care for yourself after your procedure. Your health care provider may also give you more specific instructions. If you have problems or questions, contact your health care provider. What can I expect after the procedure? After the procedure, it is common to have:  Pain at your incision sites. You will be given medicines to control this pain.  Mild nausea or vomiting.  Bloating and possible shoulder pain from the air-like gas that was used during the procedure. Follow these instructions at  home: Incision care   Follow instructions from your health care provider about how to take care of your incisions. Make sure you: ? Wash your hands with soap and water before you change your bandage (dressing). If soap and water are not available, use hand sanitizer. ? Change your dressing as told by your health care provider. ? Leave stitches (sutures), skin glue, or adhesive strips in place. These skin closures may need to be in place for 2 weeks or longer. If adhesive strip edges start to loosen and curl up, you may trim the loose edges. Do not remove adhesive strips completely unless your health care provider tells you to do that.  Do not take baths, swim, or use a hot tub until your health care provider approves. Ask your health care provider if you can take showers. You may only be allowed to take sponge baths for bathing.  Check your incision area every day for signs of infection. Check for: ? More redness, swelling, or pain. ? More fluid or blood. ? Warmth. ? Pus or a bad smell. Activity  Do not drive or use heavy machinery while taking prescription pain medicine.  Do not lift anything that is heavier than 10 lb (4.5 kg) until your health care provider approves.  Do not play contact sports until your health care provider approves.  Do not drive for 24 hours if you were given a medicine to help you relax (sedative).  Rest as needed. Do not return to work or school until your health care provider approves. General instructions  Take over-the-counter and prescription medicines only as told by your health care provider.  To prevent or treat constipation while you are taking prescription pain medicine, your health care provider may recommend that you: ? Drink enough fluid to keep your urine clear or pale yellow. ? Take over-the-counter or prescription medicines. ? Eat foods that are high in fiber, such as fresh fruits and vegetables, whole grains, and beans. ? Limit foods that  are high in fat and processed sugars, such as fried and sweet foods. Contact a health care provider if:  You develop a rash.  You have more redness, swelling, or pain around your incisions.  You have more fluid or blood coming from your incisions.  Your incisions feel warm to the touch.  You have pus or a bad smell coming from your incisions.  You have a fever.  One or more of your incisions breaks open. Get help right away if:  You have trouble breathing.  You have chest pain.  You have increasing pain in your shoulders.  You faint or feel dizzy when you stand.  You have severe pain in your abdomen.  You have nausea or vomiting that lasts for more than one day.  You have leg pain. This information is not intended to replace advice given to you by your health care provider. Make sure you discuss any questions you have with your health care provider. Document Revised: 03/15/2017 Document Reviewed: 09/19/2015 Elsevier Patient Education  2020 Elsevier Inc[employer]  General Anesthesia, Adult, Care After This sheet gives you information about how to care for yourself after your procedure. Your health care provider may also give you more specific instructions. If you have problems or questions, contact your health care provider. What can I expect after the procedure? After the procedure, the following side effects are common:  Pain or discomfort at the IV site.  Nausea.  Vomiting.  Sore throat.  Trouble concentrating.  Feeling cold or chills.  Weak or tired.  Sleepiness and fatigue.  Soreness and body aches. These side effects can affect parts of the body that were not involved in surgery. Follow these instructions at home:  For at least 24 hours after the procedure:  Have a responsible adult stay with you. It is important to have someone help care for you until you are awake and alert.  Rest as needed.  Do not: ? Participate in activities in which you could fall  or become injured. ? Drive. ? Use heavy machinery. ? Drink alcohol. ? Take sleeping pills or medicines that cause drowsiness. ? Make important decisions or sign legal documents. ? Take care of children on your own. Eating and drinking  Follow any instructions from your health care provider about eating or drinking restrictions.  When you feel hungry, start by eating small amounts of foods that are soft and easy to digest (bland), such as toast. Gradually return to your regular diet.  Drink enough fluid to keep your urine pale yellow.  If you vomit, rehydrate by drinking water, juice, or clear broth. General instructions  If you have sleep apnea, surgery and certain medicines can increase your risk for breathing problems. Follow instructions from your health care provider about wearing your sleep device: ? Anytime you are sleeping, including during daytime  naps. ? While taking prescription pain medicines, sleeping medicines, or medicines that make you drowsy.  Return to your normal activities as told by your health care provider. Ask your health care provider what activities are safe for you.  Take over-the-counter and prescription medicines only as told by your health care provider.  If you smoke, do not smoke without supervision.  Keep all follow-up visits as told by your health care provider. This is important. Contact a health care provider if:  You have nausea or vomiting that does not get better with medicine.  You cannot eat or drink without vomiting.  You have pain that does not get better with medicine.  You are unable to pass urine.  You develop a skin rash.  You have a fever.  You have redness around your IV site that gets worse. Get help right away if:  You have difficulty breathing.  You have chest pain.  You have blood in your urine or stool, or you vomit blood. Summary  After the procedure, it is common to have a sore throat or nausea. It is also  common to feel tired.  Have a responsible adult stay with you for the first 24 hours after general anesthesia. It is important to have someone help care for you until you are awake and alert.  When you feel hungry, start by eating small amounts of foods that are soft and easy to digest (bland), such as toast. Gradually return to your regular diet.  Drink enough fluid to keep your urine pale yellow.  Return to your normal activities as told by your health care provider. Ask your health care provider what activities are safe for you. This information is not intended to replace advice given to you by your health care provider. Make sure you discuss any questions you have with your health care provider. Document Revised: 04/05/2017 Document Reviewed: 11/16/2016 Elsevier Patient Education  [place]. How to Use Chlorhexidine for Bathing Chlorhexidine gluconate (CHG) is a germ-killing (antiseptic) solution that is used to clean the skin. It can get rid of the bacteria that normally live on the skin and can keep them away for about 24 hours. To clean your skin with CHG, you may be given:  A CHG solution to use in the shower or as part of a sponge bath.  A prepackaged cloth that contains CHG. Cleaning your skin with CHG may help lower the risk for infection:  While you are staying in the intensive care unit of the hospital.  If you have a vascular access, such as a central line, to provide short-term or long-term access to your veins.  If you have a catheter to drain urine from your bladder.  If you are on a ventilator. A ventilator is a machine that helps you breathe by moving air in and out of your lungs.  After surgery. What are the risks? Risks of using CHG include:  A skin reaction.  Hearing loss, if CHG gets in your ears.  Eye injury, if CHG gets in your eyes and is not rinsed out.  The CHG product catching fire. Make sure that you avoid smoking and flames after applying  CHG to your skin. Do not use CHG:  If you have a chlorhexidine allergy or have previously reacted to chlorhexidine.  On babies younger than 79 months of age. How to use CHG solution  Use CHG only as told by your health care provider, and follow the instructions on the label.  Use the full amount of CHG as directed. Usually, this is one bottle. During a shower Follow these steps when using CHG solution during a shower (unless your health care provider gives you different instructions): 1. Start the shower. 2. Use your normal soap and shampoo to wash your face and hair. 3. Turn off the shower or move out of the shower stream. 4. Pour the CHG onto a clean washcloth. Do not use any type of brush or rough-edged sponge. 5. Starting at your neck, lather your body down to your toes. Make sure you follow these instructions: ? If you will be having surgery, pay special attention to the part of your body where you will be having surgery. Scrub this area for at least 1 minute. ? Do not use CHG on your head or face. If the solution gets into your ears or eyes, rinse them well with water. ? Avoid your genital area. ? Avoid any areas of skin that have broken skin, cuts, or scrapes. ? Scrub your back and under your arms. Make sure to wash skin folds. 6. Let the lather sit on your skin for 1-2 minutes or as long as told by your health care provider. 7. Thoroughly rinse your entire body in the shower. Make sure that all body creases and crevices are rinsed well. 8. Dry off with a clean towel. Do not put any substances on your body afterward--such as powder, lotion, or perfume--unless you are told to do so by your health care provider. Only use lotions that are recommended by the manufacturer. 9. Put on clean clothes or pajamas. 10. If it is the night before your surgery, sleep in clean sheets.  During a sponge bath Follow these steps when using CHG solution during a sponge bath (unless your health care  provider gives you different instructions): 1. Use your normal soap and shampoo to wash your face and hair. 2. Pour the CHG onto a clean washcloth. 3. Starting at your neck, lather your body down to your toes. Make sure you follow these instructions: ? If you will be having surgery, pay special attention to the part of your body where you will be having surgery. Scrub this area for at least 1 minute. ? Do not use CHG on your head or face. If the solution gets into your ears or eyes, rinse them well with water. ? Avoid your genital area. ? Avoid any areas of skin that have broken skin, cuts, or scrapes. ? Scrub your back and under your arms. Make sure to wash skin folds. 4. Let the lather sit on your skin for 1-2 minutes or as long as told by your health care provider. 5. Using a different clean, wet washcloth, thoroughly rinse your entire body. Make sure that all body creases and crevices are rinsed well. 6. Dry off with a clean towel. Do not put any substances on your body afterward--such as powder, lotion, or perfume--unless you are told to do so by your health care provider. Only use lotions that are recommended by the manufacturer. 7. Put on clean clothes or pajamas. 8. If it is the night before your surgery, sleep in clean sheets. How to use CHG prepackaged cloths  Only use CHG cloths as told by your health care provider, and follow the instructions on the label.  Use the CHG cloth on clean, dry skin.  Do not use the CHG cloth on your head or face unless your health care provider tells you to.  When  washing with the CHG cloth: ? Avoid your genital area. ? Avoid any areas of skin that have broken skin, cuts, or scrapes. Before surgery Follow these steps when using a CHG cloth to clean before surgery (unless your health care provider gives you different instructions): 1. Using the CHG cloth, vigorously scrub the part of your body where you will be having surgery. Scrub using a  back-and-forth motion for 3 minutes. The area on your body should be completely wet with CHG when you are done scrubbing. 2. Do not rinse. Discard the cloth and let the area air-dry. Do not put any substances on the area afterward, such as powder, lotion, or perfume. 3. Put on clean clothes or pajamas. 4. If it is the night before your surgery, sleep in clean sheets.  For general bathing Follow these steps when using CHG cloths for general bathing (unless your health care provider gives you different instructions). 1. Use a separate CHG cloth for each area of your body. Make sure you wash between any folds of skin and between your fingers and toes. Wash your body in the following order, switching to a new cloth after each step: ? The front of your neck, shoulders, and chest. ? Both of your arms, under your arms, and your hands. ? Your stomach and groin area, avoiding the genitals. ? Your right leg and foot. ? Your left leg and foot. ? The back of your neck, your back, and your buttocks. 2. Do not rinse. Discard the cloth and let the area air-dry. Do not put any substances on your body afterward--such as powder, lotion, or perfume--unless you are told to do so by your health care provider. Only use lotions that are recommended by the manufacturer. 3. Put on clean clothes or pajamas. Contact a health care provider if:  Your skin gets irritated after scrubbing.  You have questions about using your solution or cloth. Get help right away if:  Your eyes become very red or swollen.  Your eyes itch badly.  Your skin itches badly and is red or swollen.  Your hearing changes.  You have trouble seeing.  You have swelling or tingling in your mouth or throat.  You have trouble breathing.  You swallow any chlorhexidine. Summary  Chlorhexidine gluconate (CHG) is a germ-killing (antiseptic) solution that is used to clean the skin. Cleaning your skin with CHG may help to lower your risk for  infection.  You may be given CHG to use for bathing. It may be in a bottle or in a prepackaged cloth to use on your skin. Carefully follow your health care provider's instructions and the instructions on the product label.  Do not use CHG if you have a chlorhexidine allergy.  Contact your health care provider if your skin gets irritated after scrubbing. This information is not intended to replace advice given to you by your health care provider. Make sure you discuss any questions you have with your health care provider. Document Revised: 06/19/2018 Document Reviewed: 02/28/2017 Elsevier Patient Education  2020 [employer].

## 2019-05-23 ENCOUNTER — Inpatient Hospital Stay (HOSPITAL_COMMUNITY): Payer: Medicare Other

## 2019-05-23 ENCOUNTER — Emergency Department (HOSPITAL_COMMUNITY): Payer: Medicare Other

## 2019-05-23 ENCOUNTER — Inpatient Hospital Stay (HOSPITAL_COMMUNITY)
Admission: EM | Admit: 2019-05-23 | Discharge: 2019-05-29 | DRG: 415 | Disposition: A | Discharge status: Home Health | Payer: Medicare Other | Attending: Family Medicine | Admitting: Family Medicine

## 2019-05-23 ENCOUNTER — Encounter (HOSPITAL_COMMUNITY): Payer: Self-pay | Admitting: Emergency Medicine

## 2019-05-23 ENCOUNTER — Other Ambulatory Visit: Payer: Self-pay

## 2019-05-23 DIAGNOSIS — Z4659 Encounter for fitting and adjustment of other gastrointestinal appliance and device: Secondary | ICD-10-CM

## 2019-05-23 DIAGNOSIS — K6389 Other specified diseases of intestine: Secondary | ICD-10-CM | POA: Diagnosis present

## 2019-05-23 DIAGNOSIS — Z9689 Presence of other specified functional implants: Secondary | ICD-10-CM | POA: Diagnosis present

## 2019-05-23 DIAGNOSIS — M109 Gout, unspecified: Secondary | ICD-10-CM | POA: Diagnosis not present

## 2019-05-23 DIAGNOSIS — Z7984 Long term (current) use of oral hypoglycemic drugs: Secondary | ICD-10-CM

## 2019-05-23 DIAGNOSIS — R338 Other retention of urine: Secondary | ICD-10-CM | POA: Diagnosis present

## 2019-05-23 DIAGNOSIS — K56609 Unspecified intestinal obstruction, unspecified as to partial versus complete obstruction: Secondary | ICD-10-CM | POA: Diagnosis present

## 2019-05-23 DIAGNOSIS — R110 Nausea: Secondary | ICD-10-CM | POA: Diagnosis not present

## 2019-05-23 DIAGNOSIS — Z20822 Contact with and (suspected) exposure to covid-19: Secondary | ICD-10-CM | POA: Diagnosis not present

## 2019-05-23 DIAGNOSIS — K567 Ileus, unspecified: Secondary | ICD-10-CM | POA: Diagnosis not present

## 2019-05-23 DIAGNOSIS — D62 Acute posthemorrhagic anemia: Secondary | ICD-10-CM | POA: Diagnosis not present

## 2019-05-23 DIAGNOSIS — Z79899 Other long term (current) drug therapy: Secondary | ICD-10-CM

## 2019-05-23 DIAGNOSIS — N400 Enlarged prostate without lower urinary tract symptoms: Secondary | ICD-10-CM | POA: Diagnosis present

## 2019-05-23 DIAGNOSIS — K8010 Calculus of gallbladder with chronic cholecystitis without obstruction: Principal | ICD-10-CM | POA: Diagnosis present

## 2019-05-23 DIAGNOSIS — K660 Peritoneal adhesions (postprocedural) (postinfection): Secondary | ICD-10-CM | POA: Diagnosis not present

## 2019-05-23 DIAGNOSIS — E119 Type 2 diabetes mellitus without complications: Secondary | ICD-10-CM | POA: Diagnosis not present

## 2019-05-23 DIAGNOSIS — R140 Abdominal distension (gaseous): Secondary | ICD-10-CM | POA: Diagnosis present

## 2019-05-23 DIAGNOSIS — E876 Hypokalemia: Secondary | ICD-10-CM | POA: Diagnosis present

## 2019-05-23 DIAGNOSIS — Z7982 Long term (current) use of aspirin: Secondary | ICD-10-CM

## 2019-05-23 DIAGNOSIS — K668 Other specified disorders of peritoneum: Secondary | ICD-10-CM | POA: Diagnosis present

## 2019-05-23 DIAGNOSIS — K811 Chronic cholecystitis: Secondary | ICD-10-CM | POA: Diagnosis present

## 2019-05-23 DIAGNOSIS — K5939 Other megacolon: Secondary | ICD-10-CM | POA: Diagnosis present

## 2019-05-23 DIAGNOSIS — F79 Unspecified intellectual disabilities: Secondary | ICD-10-CM

## 2019-05-23 DIAGNOSIS — Z87442 Personal history of urinary calculi: Secondary | ICD-10-CM

## 2019-05-23 DIAGNOSIS — R42 Dizziness and giddiness: Secondary | ICD-10-CM | POA: Diagnosis not present

## 2019-05-23 DIAGNOSIS — I10 Essential (primary) hypertension: Secondary | ICD-10-CM | POA: Diagnosis present

## 2019-05-23 LAB — COMPREHENSIVE METABOLIC PANEL
ALT: 14 U/L (ref 0–44)
AST: 18 U/L (ref 15–41)
Albumin: 4.8 g/dL (ref 3.5–5.0)
Alkaline Phosphatase: 96 U/L (ref 38–126)
Anion gap: 11 (ref 5–15)
BUN: 24 mg/dL — ABNORMAL HIGH (ref 8–23)
CO2: 26 mmol/L (ref 22–32)
Calcium: 10.2 mg/dL (ref 8.9–10.3)
Chloride: 99 mmol/L (ref 98–111)
Creatinine, Ser: 1.22 mg/dL (ref 0.61–1.24)
GFR calc Af Amer: >60 mL/min (ref >60)
GFR calc non Af Amer: >60 mL/min (ref >60)
Glucose, Bld: 226 mg/dL — ABNORMAL HIGH (ref 70–99)
Potassium: 4.2 mmol/L (ref 3.5–5.1)
Sodium: 136 mmol/L (ref 135–145)
Total Bilirubin: 0.5 mg/dL (ref 0.3–1.2)
Total Protein: 9.5 g/dL — ABNORMAL HIGH (ref 6.5–8.1)

## 2019-05-23 LAB — CBC WITH DIFFERENTIAL/PLATELET
Abs Immature Granulocytes: 0.02 10*3/uL (ref 0.00–0.07)
Basophils Absolute: 0 10*3/uL (ref 0.0–0.1)
Basophils Relative: 0 %
Eosinophils Absolute: 0 10*3/uL (ref 0.0–0.5)
Eosinophils Relative: 0 %
HCT: 45.1 % (ref 39.0–52.0)
Hemoglobin: 13.6 g/dL (ref 13.0–17.0)
Immature Granulocytes: 0 %
Lymphocytes Relative: 7 %
Lymphs Abs: 0.7 10*3/uL (ref 0.7–4.0)
MCH: 26.2 pg (ref 26.0–34.0)
MCHC: 30.2 g/dL (ref 30.0–36.0)
MCV: 86.7 fL (ref 80.0–100.0)
Monocytes Absolute: 0.2 10*3/uL (ref 0.1–1.0)
Monocytes Relative: 3 %
Neutro Abs: 8.4 10*3/uL — ABNORMAL HIGH (ref 1.7–7.7)
Neutrophils Relative %: 90 %
Platelets: 243 10*3/uL (ref 150–400)
RBC: 5.2 MIL/uL (ref 4.22–5.81)
RDW: 16.8 % — ABNORMAL HIGH (ref 11.5–15.5)
WBC: 9.4 10*3/uL (ref 4.0–10.5)
nRBC: 0 % (ref 0.0–0.2)

## 2019-05-23 LAB — GLUCOSE, CAPILLARY
Glucose-Capillary: 138 mg/dL — ABNORMAL HIGH (ref 70–99)
Glucose-Capillary: 146 mg/dL — ABNORMAL HIGH (ref 70–99)
Glucose-Capillary: 139 mg/dL — ABNORMAL HIGH (ref 70–99)
Glucose-Capillary: 143 mg/dL — ABNORMAL HIGH (ref 70–99)
Glucose-Capillary: 110 mg/dL — ABNORMAL HIGH (ref 70–99)

## 2019-05-23 LAB — HEMOGLOBIN A1C
Hgb A1c MFr Bld: 7.4 % — ABNORMAL HIGH (ref 4.8–5.6)
Mean Plasma Glucose: 165.68 mg/dL

## 2019-05-23 MED ORDER — ONDANSETRON HCL 4 MG/2ML IJ SOLN
4.0000 mg | Freq: Four times a day (QID) | INTRAMUSCULAR | Status: DC | PRN
  Administered 2019-05-28: 4 mg via INTRAVENOUS
  Filled 2019-05-23: qty 2

## 2019-05-23 MED ORDER — IOHEXOL 300 MG/ML  SOLN
100.0000 mL | Freq: Once | INTRAMUSCULAR | Status: AC | PRN
  Administered 2019-05-23: 100 mL via INTRAVENOUS

## 2019-05-23 MED ORDER — PIPERACILLIN-TAZOBACTAM 3.375 G IVPB
3.3750 g | Freq: Three times a day (TID) | INTRAVENOUS | Status: DC
  Administered 2019-05-23 – 2019-05-25 (×7): 3.375 g via INTRAVENOUS
  Filled 2019-05-23 (×8): qty 50

## 2019-05-23 MED ORDER — ONDANSETRON HCL 4 MG/2ML IJ SOLN
4.0000 mg | Freq: Once | INTRAMUSCULAR | Status: AC
  Administered 2019-05-23: 4 mg via INTRAVENOUS
  Filled 2019-05-23: qty 2

## 2019-05-23 MED ORDER — ENOXAPARIN SODIUM 40 MG/0.4ML ~~LOC~~ SOLN
40.0000 mg | SUBCUTANEOUS | Status: DC
  Administered 2019-05-23 – 2019-05-29 (×7): 40 mg via SUBCUTANEOUS
  Filled 2019-05-23 (×7): qty 0.4

## 2019-05-23 MED ORDER — LACTATED RINGERS IV SOLN: INTRAVENOUS | Status: DC

## 2019-05-23 MED ORDER — ONDANSETRON HCL 4 MG PO TABS: 4.0000 mg | ORAL_TABLET | Freq: Four times a day (QID) | ORAL | Status: DC | PRN

## 2019-05-23 MED ORDER — FENTANYL CITRATE (PF) 100 MCG/2ML IJ SOLN
50.0000 ug | Freq: Once | INTRAMUSCULAR | Status: AC
  Administered 2019-05-23: 50 ug via INTRAVENOUS
  Filled 2019-05-23: qty 2

## 2019-05-23 MED ORDER — ACETAMINOPHEN 325 MG PO TABS: 650.0000 mg | ORAL_TABLET | Freq: Four times a day (QID) | ORAL | Status: DC | PRN

## 2019-05-23 MED ORDER — INSULIN ASPART 100 UNIT/ML ~~LOC~~ SOLN
0.0000 [IU] | SUBCUTANEOUS | Status: DC
  Administered 2019-05-23 – 2019-05-24 (×6): 1 [IU] via SUBCUTANEOUS
  Administered 2019-05-25 – 2019-05-26 (×3): 2 [IU] via SUBCUTANEOUS
  Administered 2019-05-26 – 2019-05-27 (×7): 1 [IU] via SUBCUTANEOUS
  Administered 2019-05-28: 2 [IU] via SUBCUTANEOUS
  Administered 2019-05-29 (×3): 1 [IU] via SUBCUTANEOUS

## 2019-05-23 MED ORDER — METOCLOPRAMIDE HCL 5 MG/ML IJ SOLN
10.0000 mg | Freq: Once | INTRAMUSCULAR | Status: AC
  Administered 2019-05-23: 10 mg via INTRAVENOUS
  Filled 2019-05-23: qty 2

## 2019-05-23 MED ORDER — LACTATED RINGERS IV BOLUS
1000.0000 mL | Freq: Once | INTRAVENOUS | Status: AC
  Administered 2019-05-23: 1000 mL via INTRAVENOUS

## 2019-05-23 MED ORDER — ACETAMINOPHEN 650 MG RE SUPP: 650.0000 mg | Freq: Four times a day (QID) | RECTAL | Status: DC | PRN

## 2019-05-23 NOTE — Progress Notes (Signed)
Patient admitted by Dr. Sharl Ma earlier today  Patient seen and examined.  NG tube is in place and he had significant output (over 1 L) since insertion.  Abdomen is distended, but appears softer than it was on admission.  Lungs are clear bilaterally.  Assessment/plan:  1. Abdominal distention, ileus versus small bowel obstruction.  Noted to have pneumatosis in stomach.  NG tube placed for decompression.  General surgery following.  Started on intravenous antibiotics.  Patient is having some loose stools.  Discussed with surgery and Flexi-Seal placed 2. Chronic cholecystitis/cholelithiasis.  Patient will need to have cholecystectomy performed.  General surgery following. 3. BPH.  Patient has history of urinary retention and does in and out cath 3 times a day.  This will be continued in the hospital.  Resume Flomax/finasteride when able to take p.o. 4. Diabetes.  Holding Metformin.  Continue sliding scale insulin.  Follow CBGs. 5. Functional status.  Patient's family reports that at baseline, he ambulates with the assistance of a walker.  [employer]

## 2019-05-23 NOTE — ED Triage Notes (Signed)
Per caregiver, pt's stomach became bloated after eating supper and pt has been "dry heaving like he was trying to get stool up instead of down". Pt never vomited and did have a BM per caregiver.

## 2019-05-23 NOTE — ED Provider Notes (Signed)
Emergency Department Provider Note   I have reviewed the triage vital signs and the nursing notes.   HISTORY  Chief Complaint Stomach Bloated   HPI [name] is a 69 y.o. male who presents with his caregiver, his aunt, who gives the history.  Apparently patient has a history of abdominal surgery 2/2 volvulus and has had chronic distension but tonight it got much worse after supper. mutliple episodes of dry heaving. Abdomen severely distended.    Level V Caveat 2/2    Past Medical History:  Diagnosis Date  . Diabetes mellitus without complication (HCC)   . History of gout   . History of kidney stones   . Hypertension   . Mentally challenged   . Urinary retention     Patient Active Problem List   Diagnosis Date Noted  . Chronic cholecystitis 05/21/2019  . Chronic constipation 03/31/2019  . Cholecystostomy care (HCC) 11/04/2018  . Acute lower UTI 09/18/2018  . Abdominal distention 09/18/2018  . Hypomagnesemia 09/09/2018  . Protein-calorie malnutrition, severe (HCC) 09/09/2018  . Acute blood loss anemia 09/09/2018  . Gangrenous cholecystitis 09/05/2018  . Volvulus (HCC) 09/03/2018  . Ileus (HCC)   . Pneumatosis of intestines   . Gall bladder disease 08/25/2018  . Small bowel ischemia (HCC)   . Acute gangrenous cholecystitis 05/24/2018  . SBO (small bowel obstruction) (HCC) 05/24/2018  . Abdominal distension   . Pressure injury of skin 04/09/2018  . C. difficile diarrhea 04/08/2018  . Bilateral hydronephrosis 04/08/2018  . Pneumoperitoneum of unknown etiology   . Diarrhea 04/07/2018  . Nonverbal 04/07/2018  . Hydronephrosis with urinary obstruction due to ureteral calculus 06/20/2016  . Sepsis secondary to UTI (HCC) 06/20/2016  . Fever 06/19/2016  . Generalized weakness 06/19/2016  . Diabetes mellitus type 2, noninsulin dependent (HCC) 06/19/2016  . Severe sepsis with septic shock (HCC) 06/19/2016  . UTI (urinary tract infection) 06/19/2016  .  Hematuria 06/19/2016  . AKI (acute kidney injury) (HCC) 06/19/2016  . Hyponatremia 06/19/2016  . Hypokalemia 06/19/2016  . BPH (benign prostatic hyperplasia) 06/19/2016  . Megacolon 05/07/2014  . Urinary retention 05/07/2014  . Intellectual disability 05/07/2014  . Hyperlipidemia 05/07/2014  . HTN (hypertension) 05/07/2014    Past Surgical History:  Procedure Laterality Date  . CYSTOSCOPY W/ URETERAL STENT PLACEMENT Bilateral 06/20/2016   Procedure: CYSTOSCOPY WITH RETROGRADE PYELOGRAM/URETERAL STENT PLACEMENT;  Surgeon: Malen Gauze, MD;  Location: [facility];  Service: Urology;  Laterality: Bilateral;  . CYSTOSCOPY WITH RETROGRADE PYELOGRAM, URETEROSCOPY AND STENT PLACEMENT Bilateral 08/20/2016   Procedure: CYSTOSCOPY WITH BILATERAL RETROGRADE PYELOGRAM, BILATERAL URETEROSCOPY,  LASER LITHOTRIPSY OF RIGHT URETERAL CALCULI, STONE BASKET EXTRACTION LEFT URETERAL CALCULI  AND BILATERAL URETERAL STENT EXCHANGE;  Surgeon: Malen Gauze, MD;  Location: [facility];  Service: Urology;  Laterality: Bilateral;  . CYSTOSCOPY WITH RETROGRADE PYELOGRAM, URETEROSCOPY AND STENT PLACEMENT Left 01/14/2019   Procedure: CYSTOSCOPY WITH LEFT RETROGRADE PYELOGRAM, LEFT URETEROSCOPY AND LEFT URETERAL STENT PLACEMENT;  Surgeon: Malen Gauze, MD;  Location: [facility];  Service: Urology;  Laterality: Left;  . CYSTOSCOPY WITH URETHRAL DILATATION  01/14/2019   Procedure: CYSTOSCOPY WITH URETHRAL DILATATION;  Surgeon: Malen Gauze, MD;  Location: [facility];  Service: Urology;;  . HOLMIUM LASER APPLICATION Bilateral 08/20/2016   Procedure: HOLMIUM LASER APPLICATION;  Surgeon: Malen Gauze, MD;  Location: [facility];  Service: Urology;  Laterality: Bilateral;  . HOLMIUM LASER APPLICATION Left 01/14/2019   Procedure: HOLMIUM LASER APPLICATION;  Surgeon: Malen Gauze, MD;  Location: [facility];  Service: Urology;  Laterality: Left;  . IR CATHETER TUBE CHANGE  07/04/2018  . IR EXCHANGE BILIARY DRAIN  06/09/2018    . IR EXCHANGE BILIARY DRAIN  10/20/2018  . IR EXCHANGE BILIARY DRAIN  01/12/2019  . IR EXCHANGE BILIARY DRAIN  04/07/2019  . IR PERC CHOLECYSTOSTOMY  05/24/2018  . LAPAROTOMY N/A 08/25/2018   Procedure: EXPLORATORY LAPAROTOMY reduction of volvulus;  Surgeon: [name], MD;  Location: [facility];  Service: General;  Laterality: N/A;    Current Outpatient Rx  . Order #: [id number] Class: Historical Med  . Order #: [id number] Class: Historical Med  . Order #: [id number] Class: Historical Med  . Order #: [id number] Class: Normal  . Order #: [id number] Class: Normal  . Order #: [id number] Class: Normal  . Order #: [id number] Class: Normal  . Order #: [id number] Class: Normal  . Order #: [id number] Class: Print  . Order #: [id number] Class: Historical Med  . Order #: [id number] Class: Normal    Allergies Patient has no known allergies.  Family History  Problem Relation Age of Onset  . Gallbladder disease Other     Social History Social History   Tobacco Use  . Smoking status: Never Smoker  . Smokeless tobacco: Never Used  Substance Use Topics  . Alcohol use: No  . Drug use: No    Review of Systems  All other systems negative except as documented in the HPI. All pertinent positives and negatives as reviewed in the HPI. ____________________________________________   PHYSICAL EXAM:  VITAL SIGNS: ED Triage Vitals  Enc Vitals Group     BP 05/23/19 0225 117/74     Pulse Rate 05/23/19 0225 81     Resp 05/23/19 0225 16     Temp 05/23/19 0225 98.1 F (36.7 C)     Temp Source 05/23/19 0225 Oral     SpO2 05/23/19 0225 96 %     Weight 05/23/19 0222 148 lb 13 oz (67.5 kg)     Height 05/23/19 0222 5\' 7"  (1.702 m)    Constitutional: Alert and oriented. Well appearing and in no acute distress. Eyes: Conjunctivae are normal. PERRL. EOMI. Head: Atraumatic. Nose: No congestion/rhinnorhea. Mouth/Throat: Mucous membranes are moist.  Oropharynx non-erythematous. Neck: No stridor.  No meningeal  signs.   Cardiovascular: Normal rate, regular rhythm. Good peripheral circulation. Grossly normal heart sounds.   Respiratory: Normal respiratory effort.  No retractions. Lungs CTAB. Gastrointestinal: diffusely tender, severe distension with tympany Musculoskeletal: No lower extremity tenderness nor edema. No gross deformities of extremities. Neurologic:  Normal speech and language. No gross focal neurologic deficits are appreciated.  Skin:  Skin is warm, dry and intact. No rash noted.   ____________________________________________   LABS (all labs ordered are listed, but only abnormal results are displayed)  Labs Reviewed  CBC WITH DIFFERENTIAL/PLATELET - Abnormal; Notable for the following components:      Result Value   RDW 16.8 (*)    Neutro Abs 8.4 (*)    All other components within normal limits  COMPREHENSIVE METABOLIC PANEL - Abnormal; Notable for the following components:   Glucose, Bld 226 (*)    BUN 24 (*)    Total Protein 9.5 (*)    All other components within normal limits   ____________________________________________  EKG   EKG Interpretation  Date/Time:    Ventricular Rate:    PR Interval:    QRS Duration:   QT Interval:    QTC Calculation:   R Axis:     Text Interpretation:  ____________________________________________  RADIOLOGY  CT ABDOMEN PELVIS W CONTRAST  Result Date: 05/23/2019 CLINICAL DATA:  69 year old male with abdominal pain and distension. Patient with chronic indwelling percutaneous cholecystostomy tube. EXAM: CT ABDOMEN AND PELVIS WITH CONTRAST TECHNIQUE: Multidetector CT imaging of the abdomen and pelvis was performed using the standard protocol following bolus administration of intravenous contrast. CONTRAST:  OMNIPAQUE IOHEXOL 300 MG/ML  SOLN COMPARISON:  12/25/2018 and prior CTs FINDINGS: Lower chest: Mild to moderate bibasilar atelectasis/scarring noted. Hepatobiliary: Portal venous gas is now identified. No focal  hepatic abnormalities are identified. A percutaneous cholecystostomy tube is present. Pancreas: No significant abnormalities Spleen: Unremarkable Adrenals/Urinary Tract: Multiple nonobstructing LEFT renal calculi are again identified, the largest measuring 1.3 cm. No hydronephrosis or obstructing urinary calculi identified. No significant RIGHT renal abnormalities noted. The adrenal glands are unremarkable. Chronic bladder wall thickening again identified. Stomach/Bowel: Moderate to marked distension of the stomach and small bowel noted with distension of the proximal colon. There is a moderate amount of stool within the mid and distal colon. No discrete transition point is identified. Pneumatosis within the gastric wall and several small bowel loops within the LEFT abdomen noted. No definite bowel wall thickening identified. Vascular/Lymphatic: The visualized mesenteric arteries and veins are patent. There is some gas noted within SMV. No enlarged lymph nodes are identified. Reproductive: Unchanged Other: No ascites, abscess or pneumoperitoneum. Musculoskeletal: No acute or suspicious bony abnormalities. Degenerative changes in the lumbar spine again noted. IMPRESSION: 1. Moderate to marked distension of the stomach and small bowel with pneumatosis within the stomach wall and within several LEFT small bowel loop walls. This is nonspecific and may be related to pressure from bowel distension, but infection, ischemia and another causes are considerations. The visualized mesenteric vessels are patent. No discrete bowel transition point in the proximal colon is distended to the point of moderate colonic stool. No evidence of pneumoperitoneum, abscess or ascites. 2. Percutaneous cholecystostomy tube. 3. Nonobstructing LEFT renal calculi. 4. Mild to moderate bibasilar atelectasis/scarring. Critical Value/emergent results were called by telephone at the time of interpretation on 05/23/2019 at 5:12 am to provider [facility] , who verbally acknowledged these results. Electronically Signed   By: Harmon Pier M.D.   On: 05/23/2019 05:12   DG Chest Portable 1 View  Result Date: 05/23/2019 CLINICAL DATA:  NG tube placement. EXAM: PORTABLE CHEST 1 VIEW COMPARISON:  09/06/2010 chest radiograph FINDINGS: An NG tube is noted with tip overlying the proximal-mid stomach. Cardiomediastinal silhouette is unchanged with low lung volumes and mild bibasilar atelectasis. No definite pleural effusion pneumothorax. IMPRESSION: NG tube with tip overlying the proximal-mid stomach. Electronically Signed   By: Harmon Pier M.D.   On: 05/23/2019 05:47    ____________________________________________   PROCEDURES  Procedure(s) performed:   Procedures  ____________________________________________   INITIAL IMPRESSION / ASSESSMENT AND PLAN / ED COURSE  Patient with significant abdominal gastric and small bowel distention with some pneumatosis in the gastric and small intestinal wall.  No evidence of mesenteric ischemia.  Unknown able to tell versus from the pressure of the air inside the lumen or from the possible infection.  With the acute onset of the symptoms after eating I suspect is more from the pressure of air.  Will place an NG tube and discussed with hospitalist.  I discussed with Dr. Sharl Ma who will admit but requests surgery consult for evaluation in the morning.  Discussed with Dr. Lovell Sheehan who took the information down and will see the patient today.  Pertinent  labs & imaging results that were available during my care of the patient were reviewed by me and considered in my medical decision making (see chart for details). ____________________________________________  FINAL CLINICAL IMPRESSION(S) / ED DIAGNOSES  Final diagnoses:  Abdominal distension    MEDICATIONS GIVEN DURING THIS VISIT:  Medications  lactated ringers bolus 1,000 mL (0 mLs Intravenous Stopped 05/23/19 0506)  ondansetron (ZOFRAN) injection 4 mg  (4 mg Intravenous Given 05/23/19 0309)  iohexol (OMNIPAQUE) 300 MG/ML solution 100 mL (100 mLs Intravenous Contrast Given 05/23/19 0433)  metoCLOPramide (REGLAN) injection 10 mg (10 mg Intravenous Given 05/23/19 0506)  fentaNYL (SUBLIMAZE) injection 50 mcg (50 mcg Intravenous Given 05/23/19 0506)     NEW OUTPATIENT MEDICATIONS STARTED DURING THIS VISIT:  New Prescriptions   No medications on file    Note:  This note was prepared with assistance of Dragon voice recognition software. Occasional wrong-word or sound-a-like substitutions may have occurred due to the inherent limitations of voice recognition software.   Michal Strzelecki, Corene Cornea, MD 05/23/19 [phone]

## 2019-05-23 NOTE — H&P (Signed)
TRH H&P    Patient Demographics:    Dannis Deroche, is a 69 y.o. male  MRN: [id number]  DOB - [birth year]  Admit Date - 05/23/2019  Referring MD/NP/PA: Harold Hedge  Outpatient Primary MD for the patient is Pearson Grippe, MD  Patient coming from: Home  Chief complaint-abdominal distention   HPI:    Ewald Beg  is a 69 y.o. male, with history of mental retardation, urinary retention, volvulus, had expiratory laparotomy with reduction of volvulus on 08/26/2018, patient have chronic abdominal distention due to intestinal dysmotility, s/p cholecystostomy tube for cholecystitis in January 2020, recently cholecystostomy drain was replaced by surgery, today was brought to the ED after abdominal distention became worse.  Also multiple episodes of dry heaving. In the ED CT scan of the abdomen and pelvis showed moderate to marked distention of the stomach and small bowel with pneumatosis within the stomach wall and within the  small bowel loop walls.  NG tube placed in the ED.  General surgery consulted. Denies chest pain or shortness of breath. Complains of abdominal pain    Review of systems:    In addition to the HPI above,    All other systems reviewed and are negative.    Past History of the following :    Past Medical History:  Diagnosis Date  . Diabetes mellitus without complication (HCC)   . History of gout   . History of kidney stones   . Hypertension   . Mentally challenged   . Urinary retention       Past Surgical History:  Procedure Laterality Date  . CYSTOSCOPY W/ URETERAL STENT PLACEMENT Bilateral 06/20/2016   Procedure: CYSTOSCOPY WITH RETROGRADE PYELOGRAM/URETERAL STENT PLACEMENT;  Surgeon: Malen Gauze, MD;  Location: [facility];  Service: Urology;  Laterality: Bilateral;  . CYSTOSCOPY WITH RETROGRADE PYELOGRAM, URETEROSCOPY AND STENT PLACEMENT Bilateral 08/20/2016   Procedure: CYSTOSCOPY  WITH BILATERAL RETROGRADE PYELOGRAM, BILATERAL URETEROSCOPY,  LASER LITHOTRIPSY OF RIGHT URETERAL CALCULI, STONE BASKET EXTRACTION LEFT URETERAL CALCULI  AND BILATERAL URETERAL STENT EXCHANGE;  Surgeon: Malen Gauze, MD;  Location: [facility];  Service: Urology;  Laterality: Bilateral;  . CYSTOSCOPY WITH RETROGRADE PYELOGRAM, URETEROSCOPY AND STENT PLACEMENT Left 01/14/2019   Procedure: CYSTOSCOPY WITH LEFT RETROGRADE PYELOGRAM, LEFT URETEROSCOPY AND LEFT URETERAL STENT PLACEMENT;  Surgeon: Malen Gauze, MD;  Location: [facility];  Service: Urology;  Laterality: Left;  . CYSTOSCOPY WITH URETHRAL DILATATION  01/14/2019   Procedure: CYSTOSCOPY WITH URETHRAL DILATATION;  Surgeon: Malen Gauze, MD;  Location: [facility];  Service: Urology;;  . HOLMIUM LASER APPLICATION Bilateral 08/20/2016   Procedure: HOLMIUM LASER APPLICATION;  Surgeon: Malen Gauze, MD;  Location: [facility];  Service: Urology;  Laterality: Bilateral;  . HOLMIUM LASER APPLICATION Left 01/14/2019   Procedure: HOLMIUM LASER APPLICATION;  Surgeon: Malen Gauze, MD;  Location: [facility];  Service: Urology;  Laterality: Left;  . IR CATHETER TUBE CHANGE  07/04/2018  . IR EXCHANGE BILIARY DRAIN  06/09/2018  . IR EXCHANGE BILIARY DRAIN  10/20/2018  . IR EXCHANGE BILIARY  DRAIN  01/12/2019  . IR EXCHANGE BILIARY DRAIN  04/07/2019  . IR PERC CHOLECYSTOSTOMY  05/24/2018  . LAPAROTOMY N/A 08/25/2018   Procedure: EXPLORATORY LAPAROTOMY reduction of volvulus;  Surgeon: [name], MD;  Location: [facility];  Service: General;  Laterality: N/A;      Social History:      Social History   Tobacco Use  . Smoking status: Never Smoker  . Smokeless tobacco: Never Used  Substance Use Topics  . Alcohol use: No       Family History :     Family History  Problem Relation Age of Onset  . Gallbladder disease Other       Home Medications:   Prior to Admission medications   Medication Sig Start Date End Date Taking? Authorizing  Provider  acetaminophen (TYLENOL) 500 MG tablet Take 1,000 mg by mouth every 6 (six) hours as needed for mild pain or headache.    [provider]  aspirin EC 81 MG tablet Take 81 mg by mouth every morning.     [provider]  atorvastatin (LIPITOR) 20 MG tablet Take 20 mg by mouth at bedtime. 03/10/19   [provider]  finasteride (PROSCAR) 5 MG tablet Take 1 tablet (5 mg total) by mouth daily. 10/23/18   Sharee Holster, NP  HYDROcodone-acetaminophen (NORCO) 5-325 MG tablet Take 1 tablet by mouth every 4 (four) hours as needed for moderate pain. 01/14/19   McKenzie, Mardene Celeste, MD  linaclotide [facility]) 145 MCG CAPS capsule Take 1 capsule (145 mcg total) by mouth daily before breakfast. 03/31/19   Rehman, Joline Maxcy, MD  magnesium oxide (MAG-OX) 400 MG tablet Take 1 tablet (400 mg total) by mouth daily. 10/23/18   Sharee Holster, NP  metFORMIN (GLUCOPHAGE) 500 MG tablet Take 2 tablets (1,000 mg total) by mouth every morning. Patient taking differently: Take 500 mg by mouth 2 (two) times daily with a meal.  10/23/18   Chilton Si, Chong Sicilian, NP  simethicone (MYLICON) 40 MG/0.6ML drops Take 0.6 mLs (40 mg total) by mouth 4 (four) times daily. 10/23/18   Sharee Holster, NP  simvastatin (ZOCOR) 40 MG tablet Take 40 mg by mouth daily.    [provider]  tamsulosin (FLOMAX) 0.4 MG CAPS capsule Take 1 capsule (0.4 mg total) by mouth every evening. 10/23/18   Sharee Holster, NP     Allergies:    No Known Allergies   Physical Exam:   Vitals  Blood pressure 128/69, pulse 89, temperature 98.1 F (36.7 C), temperature source Oral, resp. rate 16, height 5\' 7"  (1.702 m), weight 67.5 kg, SpO2 93 %.  1.  General: Appears in mild distress  2. Psychiatric: Alert, nonverbal  3. Neurologic: Moving all extremities, no focal deficit noted, cranial nerves II through XII grossly intact  4. HEENMT:  Atraumatic normocephalic, extraocular muscles are intact  5. Respiratory  : Clear to auscultation bilaterally  6. Cardiovascular : S1-S2, regular, no murmur auscultated  7. Gastrointestinal:  Abdomen is severely distended, soft, mild tenderness to palpation      Data Review:    CBC Recent Labs  Lab 05/23/19 0311  WBC 9.4  HGB 13.6  HCT 45.1  PLT 243  MCV 86.7  MCH 26.2  MCHC 30.2  RDW 16.8*  LYMPHSABS 0.7  MONOABS 0.2  EOSABS 0.0  BASOSABS 0.0   ------------------------------------------------------------------------------------------------------------------  Results for orders placed or performed during the hospital encounter of 05/23/19 (from the past 48 hour(s))  CBC with Differential     Status: Abnormal   Collection Time: 05/23/19  3:11 AM  Result Value Ref Range   WBC 9.4 4.0 - 10.5 K/uL   RBC 5.20 4.22 - 5.81 MIL/uL   Hemoglobin 13.6 13.0 - 17.0 g/dL   HCT 98.1 19.1 - 47.8 %   MCV 86.7 80.0 - 100.0 fL   MCH 26.2 26.0 - 34.0 pg   MCHC 30.2 30.0 - 36.0 g/dL   RDW 29.5 (H) 62.1 - 30.8 %   Platelets 243 150 - 400 K/uL   nRBC 0.0 0.0 - 0.2 %   Neutrophils Relative % 90 %   Neutro Abs 8.4 (H) 1.7 - 7.7 K/uL   Lymphocytes Relative 7 %   Lymphs Abs 0.7 0.7 - 4.0 K/uL   Monocytes Relative 3 %   Monocytes Absolute 0.2 0.1 - 1.0 K/uL   Eosinophils Relative 0 %   Eosinophils Absolute 0.0 0.0 - 0.5 K/uL   Basophils Relative 0 %   Basophils Absolute 0.0 0.0 - 0.1 K/uL   Immature Granulocytes 0 %   Abs Immature Granulocytes 0.02 0.00 - 0.07 K/uL    Comment: Performed at [facility], [address] [place], [place] [address]  Comprehensive metabolic panel     Status: Abnormal   Collection Time: 05/23/19  3:11 AM  Result Value Ref Range   Sodium 136 135 - 145 mmol/L   Potassium 4.2 3.5 - 5.1 mmol/L   Chloride 99 98 - 111 mmol/L   CO2 26 22 - 32 mmol/L   Glucose, Bld 226 (H) 70 - 99 mg/dL   BUN 24 (H) 8 - 23 mg/dL   Creatinine, Ser 6.96 0.61 - 1.24 mg/dL   Calcium 29.5 8.9 - 28.4 mg/dL   Total Protein 9.5 (H) 6.5 - 8.1 g/dL    Albumin 4.8 3.5 - 5.0 g/dL   AST 18 15 - 41 U/L   ALT 14 0 - 44 U/L   Alkaline Phosphatase 96 38 - 126 U/L   Total Bilirubin 0.5 0.3 - 1.2 mg/dL   GFR calc non Af Amer >60 >60 mL/min   GFR calc Af Amer >60 >60 mL/min   Anion gap 11 5 - 15    Comment: Performed at [facility], [address] [place], [place] [address]    Chemistries  Recent Labs  Lab 05/23/19 0311  NA 136  K 4.2  CL 99  CO2 26  GLUCOSE 226*  BUN 24*  CREATININE 1.22  CALCIUM 10.2  AST 18  ALT 14  ALKPHOS 96  BILITOT 0.5   ------------------------------------------------------------------------------------------------------------------  ------------------------------------------------------------------------------------------------------------------ GFR: Estimated Creatinine Clearance: 53.4 mL/min (by C-G formula based on SCr of 1.22 mg/dL). Liver Function Tests: Recent Labs  Lab 05/23/19 0311  AST 18  ALT 14  ALKPHOS 96  BILITOT 0.5  PROT 9.5*  ALBUMIN 4.8    --------------------------------------------------------------------------------------------------------------- Urine analysis:    Component Value Date/Time   COLORURINE YELLOW 12/25/2018 2205   APPEARANCEUR CLOUDY (A) 12/25/2018 2205   LABSPEC 1.014 12/25/2018 2205   PHURINE 6.0 12/25/2018 2205   GLUCOSEU 50 (A) 12/25/2018 2205   HGBUR MODERATE (A) 12/25/2018 2205   BILIRUBINUR NEGATIVE 12/25/2018 2205   KETONESUR NEGATIVE 12/25/2018 2205   PROTEINUR 30 (A) 12/25/2018 2205   UROBILINOGEN 1.0 07/20/2014 1718   NITRITE NEGATIVE 12/25/2018 2205   LEUKOCYTESUR SMALL (A) 12/25/2018 2205      Imaging Results:    CT ABDOMEN PELVIS W CONTRAST  Result Date: 05/23/2019 CLINICAL  DATA:  69 year old male with abdominal pain and distension. Patient with chronic indwelling percutaneous cholecystostomy tube. EXAM: CT ABDOMEN AND PELVIS WITH CONTRAST TECHNIQUE: Multidetector CT imaging of the abdomen and pelvis was performed using the standard  protocol following bolus administration of intravenous contrast. CONTRAST:  13mL OMNIPAQUE IOHEXOL 300 MG/ML  SOLN COMPARISON:  12/25/2018 and prior CTs FINDINGS: Lower chest: Mild to moderate bibasilar atelectasis/scarring noted. Hepatobiliary: Portal venous gas is now identified. No focal hepatic abnormalities are identified. A percutaneous cholecystostomy tube is present. Pancreas: No significant abnormalities Spleen: Unremarkable Adrenals/Urinary Tract: Multiple nonobstructing LEFT renal calculi are again identified, the largest measuring 1.3 cm. No hydronephrosis or obstructing urinary calculi identified. No significant RIGHT renal abnormalities noted. The adrenal glands are unremarkable. Chronic bladder wall thickening again identified. Stomach/Bowel: Moderate to marked distension of the stomach and small bowel noted with distension of the proximal colon. There is a moderate amount of stool within the mid and distal colon. No discrete transition point is identified. Pneumatosis within the gastric wall and several small bowel loops within the LEFT abdomen noted. No definite bowel wall thickening identified. Vascular/Lymphatic: The visualized mesenteric arteries and veins are patent. There is some gas noted within SMV. No enlarged lymph nodes are identified. Reproductive: Unchanged Other: No ascites, abscess or pneumoperitoneum. Musculoskeletal: No acute or suspicious bony abnormalities. Degenerative changes in the lumbar spine again noted. IMPRESSION: 1. Moderate to marked distension of the stomach and small bowel with pneumatosis within the stomach wall and within several LEFT small bowel loop walls. This is nonspecific and may be related to pressure from bowel distension, but infection, ischemia and another causes are considerations. The visualized mesenteric vessels are patent. No discrete bowel transition point in the proximal colon is distended to the point of moderate colonic stool. No evidence of  pneumoperitoneum, abscess or ascites. 2. Percutaneous cholecystostomy tube. 3. Nonobstructing LEFT renal calculi. 4. Mild to moderate bibasilar atelectasis/scarring. Critical Value/emergent results were called by telephone at the time of interpretation on 05/23/2019 at 5:12 am to provider [facility] , who verbally acknowledged these results. Electronically Signed   By: Margarette Canada M.D.   On: 05/23/2019 05:12       Assessment & Plan:    Active Problems:   Abdominal distension   1. Abdominal distention-ileus versus SBO, moderate to marked distention of stomach and small bowel with pneumatosis noted on the CT.  NG tube with suction has been placed.  Will consult general surgery to see patient in a.m.  Start Ringer lactate at 100 mL/h. 2. Chronic cholecystitis-cholecystostomy tube is in place, patient was seen by general surgery 2 days ago and they plan to do laparoscopic cholecystectomy.  We will continue to monitor in the hospital. 3. BPH-patient has history of urine retention, patients are not does and out cath 3 times a day.  Will order In-N-Out cath every 8 hours. 4. Diabetes mellitus type 2-hold Metformin, start sliding scale insulin with NovoLog.  Check CBG every 4 hours.    DVT Prophylaxis-   Lovenox   AM Labs Ordered, also please review Full Orders  Family Communication: Admission, patients condition and plan of care including tests being ordered have been discussed with the patient and his aunt at bedside who indicate understanding and agree with the plan and Code Status.  Code Status: Full code  Admission status: Inpatient: Based on patients clinical presentation and evaluation of above clinical data, I have made determination that patient meets Inpatient criteria at this time.  Time spent  in minutes : 60 minutes   Kavion Mancinas S Katheleen Stella M.D

## 2019-05-23 NOTE — Consult Note (Signed)
Reason for Consult: Abdominal distention, pneumatosis, indwelling cholecystostomy tube Referring Physician: Dr. Vern Claude is an 69 y.o. male.  HPI: Patient is a 69 year old mentally challenged black male who has been followed by Dr. Henreitta Leber of general surgery for cholelithiasis.  Patient is status post reduction of the sigmoid volvulus in 2020.  He subsequently has had a cholecystostomy tube in place for some time now.  He has a history of abdominal distention due to bowel dysfunction.  He was just seen by Dr. Henreitta Leber on 05/21/2019.  Laparoscopic cholecystectomy with possible open was scheduled for 06/01/2019.  He was brought to the emergency room early this morning by his family caregiver due to worsening abdominal distention.  A CT scan of the abdomen was performed which revealed significant gastric and bowel distention with areas of pneumatosis and portal venous gas.  His vital signs were stable and his white blood cell count was within normal limits.  An NG tube was placed and he was admitted to the hospital for further evaluation and treatment.  Patient is not too communicative and has not appeared to be in too much discomfort.  Past Medical History:  Diagnosis Date  . Diabetes mellitus without complication (HCC)   . History of gout   . History of kidney stones   . Hypertension   . Mentally challenged   . Urinary retention     Past Surgical History:  Procedure Laterality Date  . CYSTOSCOPY W/ URETERAL STENT PLACEMENT Bilateral 06/20/2016   Procedure: CYSTOSCOPY WITH RETROGRADE PYELOGRAM/URETERAL STENT PLACEMENT;  Surgeon: Malen Gauze, MD;  Location: [facility];  Service: Urology;  Laterality: Bilateral;  . CYSTOSCOPY WITH RETROGRADE PYELOGRAM, URETEROSCOPY AND STENT PLACEMENT Bilateral 08/20/2016   Procedure: CYSTOSCOPY WITH BILATERAL RETROGRADE PYELOGRAM, BILATERAL URETEROSCOPY,  LASER LITHOTRIPSY OF RIGHT URETERAL CALCULI, STONE BASKET EXTRACTION LEFT URETERAL CALCULI  AND  BILATERAL URETERAL STENT EXCHANGE;  Surgeon: Malen Gauze, MD;  Location: [facility];  Service: Urology;  Laterality: Bilateral;  . CYSTOSCOPY WITH RETROGRADE PYELOGRAM, URETEROSCOPY AND STENT PLACEMENT Left 01/14/2019   Procedure: CYSTOSCOPY WITH LEFT RETROGRADE PYELOGRAM, LEFT URETEROSCOPY AND LEFT URETERAL STENT PLACEMENT;  Surgeon: Malen Gauze, MD;  Location: [facility];  Service: Urology;  Laterality: Left;  . CYSTOSCOPY WITH URETHRAL DILATATION  01/14/2019   Procedure: CYSTOSCOPY WITH URETHRAL DILATATION;  Surgeon: Malen Gauze, MD;  Location: [facility];  Service: Urology;;  . HOLMIUM LASER APPLICATION Bilateral 08/20/2016   Procedure: HOLMIUM LASER APPLICATION;  Surgeon: Malen Gauze, MD;  Location: [facility];  Service: Urology;  Laterality: Bilateral;  . HOLMIUM LASER APPLICATION Left 01/14/2019   Procedure: HOLMIUM LASER APPLICATION;  Surgeon: Malen Gauze, MD;  Location: [facility];  Service: Urology;  Laterality: Left;  . IR CATHETER TUBE CHANGE  07/04/2018  . IR EXCHANGE BILIARY DRAIN  06/09/2018  . IR EXCHANGE BILIARY DRAIN  10/20/2018  . IR EXCHANGE BILIARY DRAIN  01/12/2019  . IR EXCHANGE BILIARY DRAIN  04/07/2019  . IR PERC CHOLECYSTOSTOMY  05/24/2018  . LAPAROTOMY N/A 08/25/2018   Procedure: EXPLORATORY LAPAROTOMY reduction of volvulus;  Surgeon: [name], MD;  Location: [facility];  Service: General;  Laterality: N/A;    Family History  Problem Relation Age of Onset  . Gallbladder disease Other     Social History:  reports that he has never smoked. He has never used smokeless tobacco. He reports that he does not drink alcohol or use drugs.  Allergies: No Known Allergies  Medications:  Scheduled: .  enoxaparin (LOVENOX) injection  40 mg Subcutaneous Q24H  . insulin aspart  0-9 Units Subcutaneous Q4H    Results for orders placed or performed during the hospital encounter of 05/23/19 (from the past 48 hour(s))  CBC with Differential     Status: Abnormal    Collection Time: 05/23/19  3:11 AM  Result Value Ref Range   WBC 9.4 4.0 - 10.5 K/uL   RBC 5.20 4.22 - 5.81 MIL/uL   Hemoglobin 13.6 13.0 - 17.0 g/dL   HCT 03.5 46.5 - 68.1 %   MCV 86.7 80.0 - 100.0 fL   MCH 26.2 26.0 - 34.0 pg   MCHC 30.2 30.0 - 36.0 g/dL   RDW 27.5 (H) 17.0 - 01.7 %   Platelets 243 150 - 400 K/uL   nRBC 0.0 0.0 - 0.2 %   Neutrophils Relative % 90 %   Neutro Abs 8.4 (H) 1.7 - 7.7 K/uL   Lymphocytes Relative 7 %   Lymphs Abs 0.7 0.7 - 4.0 K/uL   Monocytes Relative 3 %   Monocytes Absolute 0.2 0.1 - 1.0 K/uL   Eosinophils Relative 0 %   Eosinophils Absolute 0.0 0.0 - 0.5 K/uL   Basophils Relative 0 %   Basophils Absolute 0.0 0.0 - 0.1 K/uL   Immature Granulocytes 0 %   Abs Immature Granulocytes 0.02 0.00 - 0.07 K/uL    Comment: Performed at [facility], [address] [place], [place] [address]  Comprehensive metabolic panel     Status: Abnormal   Collection Time: 05/23/19  3:11 AM  Result Value Ref Range   Sodium 136 135 - 145 mmol/L   Potassium 4.2 3.5 - 5.1 mmol/L   Chloride 99 98 - 111 mmol/L   CO2 26 22 - 32 mmol/L   Glucose, Bld 226 (H) 70 - 99 mg/dL   BUN 24 (H) 8 - 23 mg/dL   Creatinine, Ser 6.75 0.61 - 1.24 mg/dL   Calcium 91.6 8.9 - 38.4 mg/dL   Total Protein 9.5 (H) 6.5 - 8.1 g/dL   Albumin 4.8 3.5 - 5.0 g/dL   AST 18 15 - 41 U/L   ALT 14 0 - 44 U/L   Alkaline Phosphatase 96 38 - 126 U/L   Total Bilirubin 0.5 0.3 - 1.2 mg/dL   GFR calc non Af Amer >60 >60 mL/min   GFR calc Af Amer >60 >60 mL/min   Anion gap 11 5 - 15    Comment: Performed at [facility], [address], [place], [place] [address]    CT ABDOMEN PELVIS W CONTRAST  Result Date: 05/23/2019 CLINICAL DATA:  68 year old male with abdominal pain and distension. Patient with chronic indwelling percutaneous cholecystostomy tube. EXAM: CT ABDOMEN AND PELVIS WITH CONTRAST TECHNIQUE: Multidetector CT imaging of the abdomen and pelvis was performed using the standard protocol following  bolus administration of intravenous contrast. CONTRAST:  OMNIPAQUE IOHEXOL 300 MG/ML  SOLN COMPARISON:  12/25/2018 and prior CTs FINDINGS: Lower chest: Mild to moderate bibasilar atelectasis/scarring noted. Hepatobiliary: Portal venous gas is now identified. No focal hepatic abnormalities are identified. A percutaneous cholecystostomy tube is present. Pancreas: No significant abnormalities Spleen: Unremarkable Adrenals/Urinary Tract: Multiple nonobstructing LEFT renal calculi are again identified, the largest measuring 1.3 cm. No hydronephrosis or obstructing urinary calculi identified. No significant RIGHT renal abnormalities noted. The adrenal glands are unremarkable. Chronic bladder wall thickening again identified. Stomach/Bowel: Moderate to marked distension of the stomach and small bowel noted with distension of the proximal colon. There  is a moderate amount of stool within the mid and distal colon. No discrete transition point is identified. Pneumatosis within the gastric wall and several small bowel loops within the LEFT abdomen noted. No definite bowel wall thickening identified. Vascular/Lymphatic: The visualized mesenteric arteries and veins are patent. There is some gas noted within SMV. No enlarged lymph nodes are identified. Reproductive: Unchanged Other: No ascites, abscess or pneumoperitoneum. Musculoskeletal: No acute or suspicious bony abnormalities. Degenerative changes in the lumbar spine again noted. IMPRESSION: 1. Moderate to marked distension of the stomach and small bowel with pneumatosis within the stomach wall and within several LEFT small bowel loop walls. This is nonspecific and may be related to pressure from bowel distension, but infection, ischemia and another causes are considerations. The visualized mesenteric vessels are patent. No discrete bowel transition point in the proximal colon is distended to the point of moderate colonic stool. No evidence of pneumoperitoneum, abscess  or ascites. 2. Percutaneous cholecystostomy tube. 3. Nonobstructing LEFT renal calculi. 4. Mild to moderate bibasilar atelectasis/scarring. Critical Value/emergent results were called by telephone at the time of interpretation on 05/23/2019 at 5:12 am to provider [facility] , who verbally acknowledged these results. Electronically Signed   By: Margarette Canada M.D.   On: 05/23/2019 05:12   DG Chest Portable 1 View  Result Date: 05/23/2019 CLINICAL DATA:  NG tube placement. EXAM: PORTABLE CHEST 1 VIEW COMPARISON:  09/06/2010 chest radiograph FINDINGS: An NG tube is noted with tip overlying the proximal-mid stomach. Cardiomediastinal silhouette is unchanged with low lung volumes and mild bibasilar atelectasis. No definite pleural effusion pneumothorax. IMPRESSION: NG tube with tip overlying the proximal-mid stomach. Electronically Signed   By: Margarette Canada M.D.   On: 05/23/2019 05:47    ROS:  Review of systems not obtained due to patient factors.  Blood pressure 106/71, pulse 98, temperature 98.1 F (36.7 C), temperature source Oral, resp. rate 16, height 5\' 7"  (1.702 m), weight 67.5 kg, SpO2 96 %. Physical Exam: Pleasant black male no acute distress Head is normocephalic, atraumatic Lungs are clear to auscultation with good breath sounds bilaterally Heart examination reveals regular rate and rhythm without S3, S4, murmurs Abdomen is very distended but not tense.  Caregiver reports it is less distended due to NG tube placement.  No rigidity is noted.  Intermittent bowel sounds are heard.  CT scan images personally reviewed  Assessment/Plan: Impression: Recurrent abdominal distention, now with scattered pneumatosis and portal venous gas which may be secondary to the abdominal distention.  He does not appear to have an acute ischemic process present as he is not in pain and the arterial vessels to the mesentery seem patent.  He does not have a leukocytosis.  He also has known cholelithiasis with ongoing  cholecystostomy tube drainage. Patient will need surgery during this admission.  Will discuss with Dr. Constance Haw.  Agree with IV Zosyn.  Discussed with caregiver.  Aviva Signs 05/23/2019, 10:33 AM

## 2019-05-23 NOTE — Progress Notes (Signed)
Pharmacy Antibiotic Note  [name] is a 69 y.o. male admitted on 05/23/2019 with intra-abdominal infection.  Pharmacy has been consulted for zosyn dosing.  Plan: Zosyn 3.375g IV q8h (4 hour infusion).  F/U cxs and clinical progress Monitor V/S, labs  Height: 5\' 7"  (170.2 cm) Weight: 148 lb 13 oz (67.5 kg) IBW/kg (Calculated) : 66.1  Temp (24hrs), Avg:98.1 F (36.7 C), Min:98.1 F (36.7 C), Max:98.1 F (36.7 C)  Recent Labs  Lab 05/23/19 0311  WBC 9.4  CREATININE 1.22    Estimated Creatinine Clearance: 53.4 mL/min (by C-G formula based on SCr of 1.22 mg/dL).    No Known Allergies  Antimicrobials this admission: Zosyn 2/6>>   Microbiology results: No cultures  Thank you for allowing pharmacy to be a part of this patient's care.  07/21/19, BS Elder Cyphers, Loura Back Clinical Pharmacist Pager [phone] 05/23/2019 9:36 AM

## 2019-05-23 NOTE — Progress Notes (Signed)
Noted in the MD note that patient straight caths TID.  Looked back since admit from the emergency department and no straight caths had been done since admit to the floor.  Also not urine output had been recorded.  Straight cathed patient using a 16 french straight catheter per PRN order and immediately got 900 cc of yellow urine.  Patient tolerated well and is currently resting comfortably in bed.   Will continue to monitor.

## 2019-05-24 DIAGNOSIS — R140 Abdominal distension (gaseous): Secondary | ICD-10-CM | POA: Diagnosis not present

## 2019-05-24 DIAGNOSIS — E119 Type 2 diabetes mellitus without complications: Secondary | ICD-10-CM

## 2019-05-24 DIAGNOSIS — N401 Enlarged prostate with lower urinary tract symptoms: Secondary | ICD-10-CM | POA: Diagnosis not present

## 2019-05-24 DIAGNOSIS — K8010 Calculus of gallbladder with chronic cholecystitis without obstruction: Secondary | ICD-10-CM | POA: Diagnosis not present

## 2019-05-24 DIAGNOSIS — K6389 Other specified diseases of intestine: Secondary | ICD-10-CM | POA: Diagnosis not present

## 2019-05-24 DIAGNOSIS — K811 Chronic cholecystitis: Secondary | ICD-10-CM | POA: Diagnosis not present

## 2019-05-24 DIAGNOSIS — N138 Other obstructive and reflux uropathy: Secondary | ICD-10-CM

## 2019-05-24 LAB — GLUCOSE, CAPILLARY
Glucose-Capillary: 112 mg/dL — ABNORMAL HIGH (ref 70–99)
Glucose-Capillary: 118 mg/dL — ABNORMAL HIGH (ref 70–99)
Glucose-Capillary: 120 mg/dL — ABNORMAL HIGH (ref 70–99)
Glucose-Capillary: 122 mg/dL — ABNORMAL HIGH (ref 70–99)
Glucose-Capillary: 122 mg/dL — ABNORMAL HIGH (ref 70–99)
Glucose-Capillary: 130 mg/dL — ABNORMAL HIGH (ref 70–99)
Glucose-Capillary: 148 mg/dL — ABNORMAL HIGH (ref 70–99)
Glucose-Capillary: 15 mg/dL — CL (ref 70–99)
Glucose-Capillary: 173 mg/dL — ABNORMAL HIGH (ref 70–99)
Glucose-Capillary: 221 mg/dL — ABNORMAL HIGH (ref 70–99)

## 2019-05-24 LAB — CBC
HCT: 36.7 % — ABNORMAL LOW (ref 39.0–52.0)
Hemoglobin: 11.2 g/dL — ABNORMAL LOW (ref 13.0–17.0)
MCH: 26.4 pg (ref 26.0–34.0)
MCHC: 30.5 g/dL (ref 30.0–36.0)
MCV: 86.4 fL (ref 80.0–100.0)
Platelets: 196 10*3/uL (ref 150–400)
RBC: 4.25 MIL/uL (ref 4.22–5.81)
RDW: 17.2 % — ABNORMAL HIGH (ref 11.5–15.5)
WBC: 8.8 10*3/uL (ref 4.0–10.5)
nRBC: 0 % (ref 0.0–0.2)

## 2019-05-24 LAB — COMPREHENSIVE METABOLIC PANEL
ALT: 9 U/L (ref 0–44)
AST: 11 U/L — ABNORMAL LOW (ref 15–41)
Albumin: 3.1 g/dL — ABNORMAL LOW (ref 3.5–5.0)
Alkaline Phosphatase: 65 U/L (ref 38–126)
Anion gap: 10 (ref 5–15)
BUN: 26 mg/dL — ABNORMAL HIGH (ref 8–23)
CO2: 28 mmol/L (ref 22–32)
Calcium: 8.9 mg/dL (ref 8.9–10.3)
Chloride: 103 mmol/L (ref 98–111)
Creatinine, Ser: 1.14 mg/dL (ref 0.61–1.24)
GFR calc Af Amer: >60 mL/min (ref >60)
GFR calc non Af Amer: >60 mL/min (ref >60)
Glucose, Bld: 126 mg/dL — ABNORMAL HIGH (ref 70–99)
Potassium: 3.7 mmol/L (ref 3.5–5.1)
Sodium: 141 mmol/L (ref 135–145)
Total Bilirubin: 0.8 mg/dL (ref 0.3–1.2)
Total Protein: 6.8 g/dL (ref 6.5–8.1)

## 2019-05-24 LAB — GLUCOSE, RANDOM: Glucose, Bld: 208 mg/dL — ABNORMAL HIGH (ref 70–99)

## 2019-05-24 LAB — SURGICAL PCR SCREEN
MRSA, PCR: NEGATIVE
Staphylococcus aureus: NEGATIVE

## 2019-05-24 MED ORDER — DEXTROSE 50 % IV SOLN
25.0000 g | INTRAVENOUS | Status: AC
  Administered 2019-05-24: 25 g via INTRAVENOUS

## 2019-05-24 MED ORDER — DEXTROSE 50 % IV SOLN
INTRAVENOUS | Status: AC
  Filled 2019-05-24: qty 50

## 2019-05-24 NOTE — Progress Notes (Signed)
CBG 15 in both left and right index fingers. CBG taken in left thumb 173. Lab notified to call with lab random glucose results.

## 2019-05-24 NOTE — Progress Notes (Signed)
Subjective: Patient noncommunicative.  Objective: Vital signs in last 24 hours: Temp:  [98.8 F (37.1 C)-99.4 F (37.4 C)] 98.8 F (37.1 C) (02/07 0604) Pulse Rate:  [95-100] 95 (02/07 0604) Resp:  [17-18] 17 (02/07 0604) BP: (116-122)/(54-58) 116/54 (02/07 0604) SpO2:  [95 %-97 %] 95 % (02/07 0604) Last BM Date: 05/23/19(flexi seal)  Intake/Output from previous day: 02/06 0701 - 02/07 0700 In: 738.5 [I.V.:588.5; IV Piggyback:150.1] Out: 3100 [Urine:900; Emesis/NG output:2000; Stool:200] Intake/Output this shift: Total I/O In: -  Out: 300 [Urine:300]  General appearance: alert and no distress GI: Soft, flat.  Questionable palpable mass in right upper quadrant which may be colon filled stool.  No rigidity is noted.  Lab Results:  Recent Labs    05/23/19 0311 05/24/19 0616  WBC 9.4 8.8  HGB 13.6 11.2*  HCT 45.1 36.7*  PLT 243 196   BMET Recent Labs    05/23/19 0311 05/24/19 0616  NA 136 141  K 4.2 3.7  CL 99 103  CO2 26 28  GLUCOSE 226* 126*  BUN 24* 26*  CREATININE 1.22 1.14  CALCIUM 10.2 8.9   PT/INR No results for input(s): LABPROT, INR in the last 72 hours.  Studies/Results: CT ABDOMEN PELVIS W CONTRAST  Result Date: 05/23/2019 CLINICAL DATA:  69 year old male with abdominal pain and distension. Patient with chronic indwelling percutaneous cholecystostomy tube. EXAM: CT ABDOMEN AND PELVIS WITH CONTRAST TECHNIQUE: Multidetector CT imaging of the abdomen and pelvis was performed using the standard protocol following bolus administration of intravenous contrast. CONTRAST:  OMNIPAQUE IOHEXOL 300 MG/ML  SOLN COMPARISON:  12/25/2018 and prior CTs FINDINGS: Lower chest: Mild to moderate bibasilar atelectasis/scarring noted. Hepatobiliary: Portal venous gas is now identified. No focal hepatic abnormalities are identified. A percutaneous cholecystostomy tube is present. Pancreas: No significant abnormalities Spleen: Unremarkable Adrenals/Urinary Tract:  Multiple nonobstructing LEFT renal calculi are again identified, the largest measuring 1.3 cm. No hydronephrosis or obstructing urinary calculi identified. No significant RIGHT renal abnormalities noted. The adrenal glands are unremarkable. Chronic bladder wall thickening again identified. Stomach/Bowel: Moderate to marked distension of the stomach and small bowel noted with distension of the proximal colon. There is a moderate amount of stool within the mid and distal colon. No discrete transition point is identified. Pneumatosis within the gastric wall and several small bowel loops within the LEFT abdomen noted. No definite bowel wall thickening identified. Vascular/Lymphatic: The visualized mesenteric arteries and veins are patent. There is some gas noted within SMV. No enlarged lymph nodes are identified. Reproductive: Unchanged Other: No ascites, abscess or pneumoperitoneum. Musculoskeletal: No acute or suspicious bony abnormalities. Degenerative changes in the lumbar spine again noted. IMPRESSION: 1. Moderate to marked distension of the stomach and small bowel with pneumatosis within the stomach wall and within several LEFT small bowel loop walls. This is nonspecific and may be related to pressure from bowel distension, but infection, ischemia and another causes are considerations. The visualized mesenteric vessels are patent. No discrete bowel transition point in the proximal colon is distended to the point of moderate colonic stool. No evidence of pneumoperitoneum, abscess or ascites. 2. Percutaneous cholecystostomy tube. 3. Nonobstructing LEFT renal calculi. 4. Mild to moderate bibasilar atelectasis/scarring. Critical Value/emergent results were called by telephone at the time of interpretation on 05/23/2019 at 5:12 am to provider [facility] , who verbally acknowledged these results. Electronically Signed   By: Harmon Pier M.D.   On: 05/23/2019 05:12   DG Chest Port 1 View  Result Date:  05/23/2019 CLINICAL  DATA:  Nasogastric tube placement. EXAM: PORTABLE CHEST 1 VIEW COMPARISON:  Same day. FINDINGS: Stable cardiomediastinal silhouette. Nasogastric tube tip is seen in proximal stomach. No pneumothorax or pleural effusion is noted. Hypoinflation of the lungs is noted with minimal bibasilar subsegmental atelectasis. Bony thorax is unremarkable. IMPRESSION: Nasogastric tube tip seen in proximal stomach. Hypoinflation of the lungs with minimal bibasilar subsegmental atelectasis. Electronically Signed   By: Marijo Conception M.D.   On: 05/23/2019 13:21   DG Chest Portable 1 View  Result Date: 05/23/2019 CLINICAL DATA:  NG tube placement. EXAM: PORTABLE CHEST 1 VIEW COMPARISON:  09/06/2010 chest radiograph FINDINGS: An NG tube is noted with tip overlying the proximal-mid stomach. Cardiomediastinal silhouette is unchanged with low lung volumes and mild bibasilar atelectasis. No definite pleural effusion pneumothorax. IMPRESSION: NG tube with tip overlying the proximal-mid stomach. Electronically Signed   By: Margarette Canada M.D.   On: 05/23/2019 05:47    Anti-infectives: Anti-infectives (From admission, onward)   Start     Dose/Rate Route Frequency Ordered Stop   05/23/19 0945  piperacillin-tazobactam (ZOSYN) IVPB 3.375 g     3.375 g 12.5 mL/hr over 240 Minutes Intravenous Every 8 hours 05/23/19 0935        Assessment/Plan: Impression: Cololithiasis with chronic cholecystostomy tube in place.  Abdominal distention has significantly resolved.  Pneumatosis and portal venous gas may be secondary to the abdominal distention.  There does not to seem to be any evidence of ischemia.  Discussed with Dr. Constance Haw.  We will proceed with exploratory laparoscopy/laparotomy, cholecystectomy tomorrow.  Dr. Constance Haw will discuss with the family tomorrow in a.m.  LOS: 1 day    Aviva Signs 05/24/2019

## 2019-05-24 NOTE — Progress Notes (Addendum)
100cc of tan drainage emptied from cholecystotomy bag. Tube dressing clean dry and intact. Covid Swab and Surgical PCR sent to lab. Patient is alert and resting in bed. NG tube to low intermittent suction and flexiseal in place. Bed alarm is on and bed in lowest position.

## 2019-05-24 NOTE — Progress Notes (Signed)
Hypoglycemic Event  CBG: 15, taken in left hand  Treatment: D50 50 mL (25 gm)  Symptoms: None  Follow-up CBG: Time:2041 CBG Result: CBG in Right hand 15, CBG in Left hand 221  Possible Reasons for Event: Inadequate meal intake  Comments/MD notified: Midlevel notified. Recheck CBG in right hand 15, Rechecked CBG in left hand 221 after given D50. Midlevel notified. New order for lab random glucose. Patient alert and resting in bed.     [name]

## 2019-05-24 NOTE — Progress Notes (Signed)
PROGRESS NOTE    [name]  JHE:[id number] DOB: 12-23-1950 DOA: 05/23/2019 PCP: Pearson Grippe, MD    Brief Narrative:  HPI per Dr. Hedwig Morton  is a 68 y.o. male, with history of mental retardation, urinary retention, volvulus, had expiratory laparotomy with reduction of volvulus on 08/26/2018, patient have chronic abdominal distention due to intestinal dysmotility, s/p cholecystostomy tube for cholecystitis in January 2020, recently cholecystostomy drain was replaced by surgery, today was brought to the ED after abdominal distention became worse.  Also multiple episodes of dry heaving. In the ED CT scan of the abdomen and pelvis showed moderate to marked distention of the stomach and small bowel with pneumatosis within the stomach wall and within the  small bowel loop walls.  NG tube placed in the ED.  General surgery consulted. Denies chest pain or shortness of breath. Complains of abdominal pain   Assessment & Plan:   Active Problems:   Diabetes mellitus type 2, noninsulin dependent (HCC)   BPH (benign prostatic hyperplasia)   Abdominal distension   Pneumatosis intestinalis   Chronic cholecystitis   1. Abdominal distention, ileus versus small bowel obstruction.  Noted to have pneumatosis in stomach.  NG tube placed for decompression.  General surgery following.  Started on intravenous antibiotics.  Patient is having some loose stools which may be resolution of his stool burden.  Abdomen is less distended today..    General surgery plans operative management. 2. Chronic cholecystitis/cholelithiasis.  Patient will need to have cholecystectomy performed.  General surgery following. 3. BPH.  Patient has history of urinary retention and does in and out cath 3 times a day.  This will be continued in the hospital.  Resume Flomax/finasteride when able to take p.o. 4. Diabetes.  Holding Metformin.  Continue sliding scale insulin.  Follow CBGs. 5. Functional status.  Patient's family  reports that at baseline, he ambulates with the assistance of a walker.   DVT prophylaxis: Lovenox Code Status: Full code Family Communication: No family present Disposition Plan: Discharge home after surgical management.   Consultants:   General surgery  Procedures:     Antimicrobials:   Zosyn 2/6 >   Subjective: NG tube remains in place.  He also had rectal tube placed overnight due to frequent stools.  Abdomen is significantly less distended today.  Patient is nonverbal.  Objective: Vitals:   05/23/19 0800 05/23/19 2057 05/24/19 0604 05/24/19 1403  BP: 106/71 (!) 122/58 (!) 116/54 (!) 108/54  Pulse: 98 100 95 88  Resp:  18 17 16   Temp:  99.4 F (37.4 C) 98.8 F (37.1 C) 98.4 F (36.9 C)  TempSrc:      SpO2: 96% 97% 95% 98%  Weight:      Height:        Intake/Output Summary (Last 24 hours) at 05/24/2019 1703 Last data filed at 05/24/2019 1654 Gross per 24 hour  Intake 647.11 ml  Output 3300 ml  Net -2652.89 ml   Filed Weights   05/23/19 0222  Weight: 67.5 kg    Examination:  General exam: Appears calm and comfortable  Respiratory system: Clear to auscultation. Respiratory effort normal. Cardiovascular system: S1 & S2 heard, RRR. No JVD, murmurs, rubs, gallops or clicks. No pedal edema. Gastrointestinal system: Abdomen is nondistended, soft and nontender.  Cholecystostomy tube present in the right upper quadrant.  No organomegaly or masses felt. Normal bowel sounds heard. Central nervous system: No focal neurological deficits. Extremities: Symmetric 5 x 5 power. Skin: No rashes, lesions  or ulcers Psychiatry: Nonverbal    Data Reviewed: I have personally reviewed following labs and imaging studies  CBC: Recent Labs  Lab 05/23/19 0311 05/24/19 0616  WBC 9.4 8.8  NEUTROABS 8.4*  --   HGB 13.6 11.2*  HCT 45.1 36.7*  MCV 86.7 86.4  PLT 243 948   Basic Metabolic Panel: Recent Labs  Lab 05/23/19 0311 05/24/19 0616  NA 136 141  K 4.2 3.7  CL  99 103  CO2 26 28  GLUCOSE 226* 126*  BUN 24* 26*  CREATININE 1.22 1.14  CALCIUM 10.2 8.9   GFR: Estimated Creatinine Clearance: 57.2 mL/min (by C-G formula based on SCr of 1.14 mg/dL). Liver Function Tests: Recent Labs  Lab 05/23/19 0311 05/24/19 0616  AST 18 11*  ALT 14 9  ALKPHOS 96 65  BILITOT 0.5 0.8  PROT 9.5* 6.8  ALBUMIN 4.8 3.1*   No results for input(s): LIPASE, AMYLASE in the last 168 hours. No results for input(s): AMMONIA in the last 168 hours. Coagulation Profile: No results for input(s): INR, PROTIME in the last 168 hours. Cardiac Enzymes: No results for input(s): CKTOTAL, CKMB, CKMBINDEX, TROPONINI in the last 168 hours. BNP (last 3 results) No results for input(s): PROBNP in the last 8760 hours. HbA1C: Recent Labs    05/23/19 0311  HGBA1C 7.4*   CBG: Recent Labs  Lab 05/24/19 0006 05/24/19 0412 05/24/19 0715 05/24/19 1101 05/24/19 1431  GLUCAP 130* 122* 112* 122* 120*   Lipid Profile: No results for input(s): CHOL, HDL, LDLCALC, TRIG, CHOLHDL, LDLDIRECT in the last 72 hours. Thyroid Function Tests: No results for input(s): TSH, T4TOTAL, FREET4, T3FREE, THYROIDAB in the last 72 hours. Anemia Panel: No results for input(s): VITAMINB12, FOLATE, FERRITIN, TIBC, IRON, RETICCTPCT in the last 72 hours. Sepsis Labs: No results for input(s): PROCALCITON, LATICACIDVEN in the last 168 hours.  No results found for this or any previous visit (from the past 240 hour(s)).       Radiology Studies: CT ABDOMEN PELVIS W CONTRAST  Result Date: 05/23/2019 CLINICAL DATA:  69 year old male with abdominal pain and distension. Patient with chronic indwelling percutaneous cholecystostomy tube. EXAM: CT ABDOMEN AND PELVIS WITH CONTRAST TECHNIQUE: Multidetector CT imaging of the abdomen and pelvis was performed using the standard protocol following bolus administration of intravenous contrast. CONTRAST:  125mL OMNIPAQUE IOHEXOL 300 MG/ML  SOLN COMPARISON:   12/25/2018 and prior CTs FINDINGS: Lower chest: Mild to moderate bibasilar atelectasis/scarring noted. Hepatobiliary: Portal venous gas is now identified. No focal hepatic abnormalities are identified. A percutaneous cholecystostomy tube is present. Pancreas: No significant abnormalities Spleen: Unremarkable Adrenals/Urinary Tract: Multiple nonobstructing LEFT renal calculi are again identified, the largest measuring 1.3 cm. No hydronephrosis or obstructing urinary calculi identified. No significant RIGHT renal abnormalities noted. The adrenal glands are unremarkable. Chronic bladder wall thickening again identified. Stomach/Bowel: Moderate to marked distension of the stomach and small bowel noted with distension of the proximal colon. There is a moderate amount of stool within the mid and distal colon. No discrete transition point is identified. Pneumatosis within the gastric wall and several small bowel loops within the LEFT abdomen noted. No definite bowel wall thickening identified. Vascular/Lymphatic: The visualized mesenteric arteries and veins are patent. There is some gas noted within SMV. No enlarged lymph nodes are identified. Reproductive: Unchanged Other: No ascites, abscess or pneumoperitoneum. Musculoskeletal: No acute or suspicious bony abnormalities. Degenerative changes in the lumbar spine again noted. IMPRESSION: 1. Moderate to marked distension of the stomach and small bowel with  pneumatosis within the stomach wall and within several LEFT small bowel loop walls. This is nonspecific and may be related to pressure from bowel distension, but infection, ischemia and another causes are considerations. The visualized mesenteric vessels are patent. No discrete bowel transition point in the proximal colon is distended to the point of moderate colonic stool. No evidence of pneumoperitoneum, abscess or ascites. 2. Percutaneous cholecystostomy tube. 3. Nonobstructing LEFT renal calculi. 4. Mild to moderate  bibasilar atelectasis/scarring. Critical Value/emergent results were called by telephone at the time of interpretation on 05/23/2019 at 5:12 am to provider [facility] , who verbally acknowledged these results. Electronically Signed   By: Harmon Pier M.D.   On: 05/23/2019 05:12   DG Chest Port 1 View  Result Date: 05/23/2019 CLINICAL DATA:  Nasogastric tube placement. EXAM: PORTABLE CHEST 1 VIEW COMPARISON:  Same day. FINDINGS: Stable cardiomediastinal silhouette. Nasogastric tube tip is seen in proximal stomach. No pneumothorax or pleural effusion is noted. Hypoinflation of the lungs is noted with minimal bibasilar subsegmental atelectasis. Bony thorax is unremarkable. IMPRESSION: Nasogastric tube tip seen in proximal stomach. Hypoinflation of the lungs with minimal bibasilar subsegmental atelectasis. Electronically Signed   By: Lupita Raider M.D.   On: 05/23/2019 13:21   DG Chest Portable 1 View  Result Date: 05/23/2019 CLINICAL DATA:  NG tube placement. EXAM: PORTABLE CHEST 1 VIEW COMPARISON:  09/06/2010 chest radiograph FINDINGS: An NG tube is noted with tip overlying the proximal-mid stomach. Cardiomediastinal silhouette is unchanged with low lung volumes and mild bibasilar atelectasis. No definite pleural effusion pneumothorax. IMPRESSION: NG tube with tip overlying the proximal-mid stomach. Electronically Signed   By: Harmon Pier M.D.   On: 05/23/2019 05:47        Scheduled Meds:  enoxaparin (LOVENOX) injection  40 mg Subcutaneous Q24H   insulin aspart  0-9 Units Subcutaneous Q4H   Continuous Infusions:  lactated ringers 100 mL/hr at 05/24/19 1246   piperacillin-tazobactam (ZOSYN)  IV 3.375 g (05/24/19 1248)     LOS: 1 day    Time spent:    Erick Blinks, MD [facility]   If 7PM-7AM, please contact night-coverage [email or web address]  05/24/2019, 5:03 PM

## 2019-05-24 NOTE — Progress Notes (Signed)
Notified by nurse tech that patients CBG was 15 and that it had been taken twice.  Upon entering the room the patient is awake and responding to commands.  Asked the tech to retake the CBG and it was 130.  Patient is not in any distress and is currently resting in his room.  Will continue to monitor the patient.

## 2019-05-25 ENCOUNTER — Ambulatory Visit (HOSPITAL_COMMUNITY): Admission: RE | Admit: 2019-05-25 | Payer: Medicare Other | Source: Ambulatory Visit | Admitting: General Surgery

## 2019-05-25 ENCOUNTER — Encounter (HOSPITAL_COMMUNITY): Payer: Self-pay | Admitting: Family Medicine

## 2019-05-25 ENCOUNTER — Inpatient Hospital Stay (HOSPITAL_COMMUNITY): Payer: Medicare Other | Admitting: Anesthesiology

## 2019-05-25 ENCOUNTER — Encounter (HOSPITAL_COMMUNITY)
Admission: EM | Disposition: A | Discharge status: Home Health | Payer: Self-pay | Source: Home / Self Care | Attending: Internal Medicine

## 2019-05-25 DIAGNOSIS — K811 Chronic cholecystitis: Secondary | ICD-10-CM

## 2019-05-25 DIAGNOSIS — N401 Enlarged prostate with lower urinary tract symptoms: Secondary | ICD-10-CM | POA: Diagnosis not present

## 2019-05-25 DIAGNOSIS — R140 Abdominal distension (gaseous): Secondary | ICD-10-CM | POA: Diagnosis not present

## 2019-05-25 DIAGNOSIS — D62 Acute posthemorrhagic anemia: Secondary | ICD-10-CM | POA: Diagnosis not present

## 2019-05-25 DIAGNOSIS — K567 Ileus, unspecified: Secondary | ICD-10-CM | POA: Diagnosis not present

## 2019-05-25 DIAGNOSIS — K6389 Other specified diseases of intestine: Secondary | ICD-10-CM

## 2019-05-25 DIAGNOSIS — K5939 Other megacolon: Secondary | ICD-10-CM | POA: Diagnosis not present

## 2019-05-25 DIAGNOSIS — K8010 Calculus of gallbladder with chronic cholecystitis without obstruction: Secondary | ICD-10-CM | POA: Diagnosis not present

## 2019-05-25 DIAGNOSIS — E119 Type 2 diabetes mellitus without complications: Secondary | ICD-10-CM | POA: Diagnosis not present

## 2019-05-25 HISTORY — PX: CHOLECYSTECTOMY: SHX55

## 2019-05-25 HISTORY — PX: LAPAROTOMY: SHX154

## 2019-05-25 LAB — GLUCOSE, CAPILLARY
Glucose-Capillary: 106 mg/dL — ABNORMAL HIGH (ref 70–99)
Glucose-Capillary: 117 mg/dL — ABNORMAL HIGH (ref 70–99)
Glucose-Capillary: 121 mg/dL — ABNORMAL HIGH (ref 70–99)
Glucose-Capillary: 123 mg/dL — ABNORMAL HIGH (ref 70–99)
Glucose-Capillary: 135 mg/dL — ABNORMAL HIGH (ref 70–99)
Glucose-Capillary: 151 mg/dL — ABNORMAL HIGH (ref 70–99)
Glucose-Capillary: 167 mg/dL — ABNORMAL HIGH (ref 70–99)

## 2019-05-25 LAB — RESPIRATORY PANEL BY RT PCR (FLU A&B, COVID)
Influenza A by PCR: NEGATIVE
Influenza B by PCR: NEGATIVE
SARS Coronavirus 2 by RT PCR: NEGATIVE

## 2019-05-25 LAB — SARS CORONAVIRUS 2 (TAT 6-24 HRS): SARS Coronavirus 2: NEGATIVE

## 2019-05-25 SURGERY — LAPAROTOMY, EXPLORATORY
Anesthesia: General | Site: Abdomen

## 2019-05-25 MED ORDER — POVIDONE-IODINE 10 % EX OINT
TOPICAL_OINTMENT | CUTANEOUS | Status: AC
  Filled 2019-05-25: qty 1

## 2019-05-25 MED ORDER — PIPERACILLIN-TAZOBACTAM 3.375 G IVPB
3.3750 g | Freq: Three times a day (TID) | INTRAVENOUS | Status: AC
  Administered 2019-05-25 – 2019-05-26 (×3): 3.375 g via INTRAVENOUS
  Filled 2019-05-25 (×6): qty 50

## 2019-05-25 MED ORDER — LACTATED RINGERS IV SOLN: Freq: Once | INTRAVENOUS | Status: AC

## 2019-05-25 MED ORDER — HEMOSTATIC AGENTS (NO CHARGE) OPTIME
TOPICAL | Status: DC | PRN
  Administered 2019-05-25: 1 via TOPICAL
  Administered 2019-05-25: 2 via TOPICAL
  Administered 2019-05-25: 1 via TOPICAL

## 2019-05-25 MED ORDER — MIDAZOLAM HCL 2 MG/2ML IJ SOLN
INTRAMUSCULAR | Status: AC
  Filled 2019-05-25: qty 2

## 2019-05-25 MED ORDER — HYDROMORPHONE HCL 1 MG/ML IJ SOLN: 0.2500 mg | INTRAMUSCULAR | Status: DC | PRN

## 2019-05-25 MED ORDER — BUPIVACAINE LIPOSOME 1.3 % IJ SUSP
INTRAMUSCULAR | Status: AC
  Filled 2019-05-25: qty 20

## 2019-05-25 MED ORDER — SUGAMMADEX SODIUM 200 MG/2ML IV SOLN
INTRAVENOUS | Status: DC | PRN
  Administered 2019-05-25: 200 mg via INTRAVENOUS

## 2019-05-25 MED ORDER — ONDANSETRON HCL 4 MG/2ML IJ SOLN
INTRAMUSCULAR | Status: DC | PRN
  Administered 2019-05-25: 4 mg via INTRAVENOUS

## 2019-05-25 MED ORDER — CHLORHEXIDINE GLUCONATE CLOTH 2 % EX PADS
6.0000 | MEDICATED_PAD | Freq: Once | CUTANEOUS | Status: AC
  Administered 2019-05-24: 6 via TOPICAL

## 2019-05-25 MED ORDER — SODIUM CHLORIDE 0.9 % IV SOLN
2.0000 g | INTRAVENOUS | Status: AC
  Administered 2019-05-25: 15:00:00 2 g via INTRAVENOUS
  Filled 2019-05-25: qty 2

## 2019-05-25 MED ORDER — MIDAZOLAM HCL 5 MG/5ML IJ SOLN
INTRAMUSCULAR | Status: DC | PRN
  Administered 2019-05-25: 1 mg via INTRAVENOUS

## 2019-05-25 MED ORDER — ROCURONIUM 10MG/ML (10ML) SYRINGE FOR MEDFUSION PUMP - OPTIME
INTRAVENOUS | Status: DC | PRN
  Administered 2019-05-25: 30 mg via INTRAVENOUS
  Administered 2019-05-25: 10 mg via INTRAVENOUS

## 2019-05-25 MED ORDER — PROPOFOL 10 MG/ML IV BOLUS
INTRAVENOUS | Status: DC | PRN
  Administered 2019-05-25: 160 mg via INTRAVENOUS

## 2019-05-25 MED ORDER — MORPHINE SULFATE (PF) 2 MG/ML IV SOLN: 2.0000 mg | INTRAVENOUS | Status: DC | PRN

## 2019-05-25 MED ORDER — LIDOCAINE HCL (CARDIAC) PF 50 MG/5ML IV SOSY
PREFILLED_SYRINGE | INTRAVENOUS | Status: DC | PRN
  Administered 2019-05-25: 60 mg via INTRAVENOUS

## 2019-05-25 MED ORDER — MEPERIDINE HCL 50 MG/ML IJ SOLN: 6.2500 mg | INTRAMUSCULAR | Status: DC | PRN

## 2019-05-25 MED ORDER — CHLORHEXIDINE GLUCONATE CLOTH 2 % EX PADS
6.0000 | MEDICATED_PAD | Freq: Once | CUTANEOUS | Status: AC
  Administered 2019-05-25: 6 via TOPICAL

## 2019-05-25 MED ORDER — BUPIVACAINE LIPOSOME 1.3 % IJ SUSP
INTRAMUSCULAR | Status: DC | PRN
  Administered 2019-05-25: 20 mL

## 2019-05-25 MED ORDER — SUCCINYLCHOLINE 20MG/ML (10ML) SYRINGE FOR MEDFUSION PUMP - OPTIME
INTRAMUSCULAR | Status: DC | PRN
  Administered 2019-05-25: 110 mg via INTRAVENOUS

## 2019-05-25 MED ORDER — FENTANYL CITRATE (PF) 100 MCG/2ML IJ SOLN
INTRAMUSCULAR | Status: DC | PRN
  Administered 2019-05-25 (×5): 50 ug via INTRAVENOUS

## 2019-05-25 MED ORDER — ACETAMINOPHEN 325 MG PO TABS
650.0000 mg | ORAL_TABLET | Freq: Four times a day (QID) | ORAL | Status: DC
  Administered 2019-05-25 – 2019-05-29 (×16): 650 mg via ORAL
  Filled 2019-05-25 (×17): qty 2

## 2019-05-25 MED ORDER — SODIUM CHLORIDE 0.9 % IR SOLN
Status: DC | PRN
  Administered 2019-05-25: 2000 mL

## 2019-05-25 MED ORDER — LACTATED RINGERS IV SOLN: INTRAVENOUS | Status: DC | PRN

## 2019-05-25 MED ORDER — EPHEDRINE SULFATE 50 MG/ML IJ SOLN
INTRAMUSCULAR | Status: DC | PRN
  Administered 2019-05-25 (×2): 10 mg via INTRAVENOUS

## 2019-05-25 MED ORDER — PROPOFOL 10 MG/ML IV BOLUS
INTRAVENOUS | Status: AC
  Filled 2019-05-25: qty 20

## 2019-05-25 MED ORDER — OXYCODONE HCL 5 MG PO TABS
5.0000 mg | ORAL_TABLET | ORAL | Status: DC | PRN
  Administered 2019-05-27: 5 mg via ORAL
  Filled 2019-05-25: qty 1

## 2019-05-25 SURGICAL SUPPLY — 48 items
APPLIER CLIP 11 MED OPEN (CLIP) ×2
APPLIER CLIP 13 LRG OPEN (CLIP) ×2
CHLORAPREP W/TINT 26 (MISCELLANEOUS) ×2 IMPLANT
CLIP APPLIE 11 MED OPEN (CLIP) ×1 IMPLANT
CLIP APPLIE 13 LRG OPEN (CLIP) ×1 IMPLANT
CLOTH BEACON ORANGE TIMEOUT ST (SAFETY) ×2 IMPLANT
CONT SPEC 4OZ CLIKSEAL STRL BL (MISCELLANEOUS) ×2 IMPLANT
COVER LIGHT HANDLE STERIS (MISCELLANEOUS) ×4 IMPLANT
COVER WAND RF STERILE (DRAPES) ×2 IMPLANT
DRAPE WARM FLUID 44X44 (DRAPES) ×2 IMPLANT
DRSG OPSITE POSTOP 4X10 (GAUZE/BANDAGES/DRESSINGS) IMPLANT
DRSG OPSITE POSTOP 4X8 (GAUZE/BANDAGES/DRESSINGS) ×2 IMPLANT
ELECT BLADE 6 FLAT ULTRCLN (ELECTRODE) ×2 IMPLANT
ELECT REM PT RETURN 9FT ADLT (ELECTROSURGICAL) ×2
ELECTRODE REM PT RTRN 9FT ADLT (ELECTROSURGICAL) ×1 IMPLANT
GLOVE BIO SURGEON STRL SZ 6.5 (GLOVE) ×2 IMPLANT
GLOVE BIOGEL PI IND STRL 6.5 (GLOVE) ×1 IMPLANT
GLOVE BIOGEL PI IND STRL 7.0 (GLOVE) ×4 IMPLANT
GLOVE BIOGEL PI INDICATOR 6.5 (GLOVE) ×1
GLOVE BIOGEL PI INDICATOR 7.0 (GLOVE) ×4
GLOVE SURG SS PI 7.5 STRL IVOR (GLOVE) ×2 IMPLANT
GOWN STRL REUS W/TWL LRG LVL3 (GOWN DISPOSABLE) ×8 IMPLANT
HANDLE SUCTION POOLE (INSTRUMENTS) ×1 IMPLANT
HEMOSTAT ARISTA ABSORB 3G PWDR (HEMOSTASIS) ×2 IMPLANT
HEMOSTAT SNOW SURGICEL 2X4 (HEMOSTASIS) ×4 IMPLANT
INST SET MAJOR GENERAL (KITS) ×2 IMPLANT
KIT TURNOVER KIT A (KITS) ×2 IMPLANT
LIGASURE IMPACT 36 18CM CVD LR (INSTRUMENTS) ×2 IMPLANT
MANIFOLD NEPTUNE II (INSTRUMENTS) ×2 IMPLANT
NEEDLE HYPO 18GX1.5 BLUNT FILL (NEEDLE) ×2 IMPLANT
NEEDLE HYPO 21X1.5 SAFETY (NEEDLE) ×2 IMPLANT
NS IRRIG 1000ML POUR BTL (IV SOLUTION) ×4 IMPLANT
PACK ABDOMINAL MAJOR (CUSTOM PROCEDURE TRAY) ×2 IMPLANT
PAD ARMBOARD 7.5X6 YLW CONV (MISCELLANEOUS) ×2 IMPLANT
RETRACTOR WND ALEXIS 25 LRG (MISCELLANEOUS) ×1 IMPLANT
RTRCTR WOUND ALEXIS 25CM LRG (MISCELLANEOUS) ×2
SET BASIN LINEN APH (SET/KITS/TRAYS/PACK) ×2 IMPLANT
SPONGE GAUZE 2X2 8PLY STRL LF (GAUZE/BANDAGES/DRESSINGS) ×2 IMPLANT
SPONGE INTESTINAL PEANUT (DISPOSABLE) ×4 IMPLANT
SPONGE LAP 18X18 RF (DISPOSABLE) ×4 IMPLANT
STAPLER VISISTAT (STAPLE) ×2 IMPLANT
SUCTION POOLE HANDLE (INSTRUMENTS) ×2
SUT PDS AB CT VIOLET #0 27IN (SUTURE) ×4 IMPLANT
SYR 20ML LL LF (SYRINGE) ×4 IMPLANT
TAPE PAPER 3X10 WHT MICROPORE (GAUZE/BANDAGES/DRESSINGS) ×2 IMPLANT
TRAY FOLEY W/BAG SLVR 16FR (SET/KITS/TRAYS/PACK) ×2
TRAY FOLEY W/BAG SLVR 16FR ST (SET/KITS/TRAYS/PACK) ×1 IMPLANT
VESSEL LOOPS MAXI RED (MISCELLANEOUS) ×2 IMPLANT

## 2019-05-25 NOTE — Anesthesia Preprocedure Evaluation (Addendum)
Anesthesia Evaluation  Patient identified by MRN, date of birth, ID band Patient awake    Reviewed: Allergy & Precautions, NPO status , Patient's Chart, lab work & pertinent test results  History of Anesthesia Complications Negative for: history of anesthetic complications  Airway Mallampati: II  TM Distance: >3 FB Neck ROM: Full    Dental  (+) Edentulous Upper, Edentulous Lower   Pulmonary    Pulmonary exam normal breath sounds clear to auscultation       Cardiovascular METS (uses walker ): hypertension, Pt. on medications Normal cardiovascular exam Rhythm:Regular Rate:Normal     Neuro/Psych PSYCHIATRIC DISORDERS Patient is nonverbal     GI/Hepatic   Endo/Other  diabetes  Renal/GU Renal disease     Musculoskeletal negative musculoskeletal ROS (+)   Abdominal   Peds  Hematology  (+) anemia ,   Anesthesia Other Findings   Reproductive/Obstetrics negative OB ROS                             Anesthesia Physical Anesthesia Plan  ASA: III  Anesthesia Plan: General   Post-op Pain Management:    Induction: Intravenous  PONV Risk Score and Plan: 4 or greater and Metaclopromide, Midazolam, Dexamethasone and Ondansetron  Airway Management Planned: Oral ETT  Additional Equipment:   Intra-op Plan:   Post-operative Plan: Extubation in OR and Possible Post-op intubation/ventilation  Informed Consent: I have reviewed the patients History and Physical, chart, labs and discussed the procedure including the risks, benefits and alternatives for the proposed anesthesia with the patient or authorized representative who has indicated his/her understanding and acceptance.     Dental advisory given  Plan Discussed with: CRNA and Surgeon  Anesthesia Plan Comments: ( risks, and possible postop intubation and ventilation was discussed with patients power of attorney)       Anesthesia  Quick Evaluation

## 2019-05-25 NOTE — Progress Notes (Addendum)
[facility]  Cholecystectomy completed. Bowel looked healthy.  Can have clears today. Will try to adv the diet as tolerated.  Foley inserted for case and cloudy urine. UA ordered. Foley removed at end of case.   Updated [name] his aunt about the case.   Algis Greenhouse, MD [facility] [address] [place] [place], [place] [address] [phone]/ [phone] (office)

## 2019-05-25 NOTE — Anesthesia Procedure Notes (Signed)
Procedure Name: Intubation Date/Time: 05/25/2019 2:42 PM Performed by: Ollen Bowl, CRNA Pre-anesthesia Checklist: Patient identified, Patient being monitored, Timeout performed, Emergency Drugs available and Suction available Patient Re-evaluated:Patient Re-evaluated prior to induction Oxygen Delivery Method: Circle system utilized Preoxygenation: Pre-oxygenation with 100% oxygen Induction Type: IV induction Ventilation: Mask ventilation without difficulty Laryngoscope Size: Mac and 3 Grade View: Grade I Tube type: Oral Tube size: 7.0 mm Number of attempts: 1 Airway Equipment and Method: Stylet Placement Confirmation: ETT inserted through vocal cords under direct vision,  positive ETCO2 and breath sounds checked- equal and bilateral Secured at: 22 cm Tube secured with: Tape Dental Injury: Teeth and Oropharynx as per pre-operative assessment

## 2019-05-25 NOTE — Progress Notes (Signed)
EKG completed and placed on chart 

## 2019-05-25 NOTE — Anesthesia Postprocedure Evaluation (Signed)
Anesthesia Post Note  Patient: [name]  Procedure(s) Performed: EXPLORATORY LAPAROTOMY (N/A Abdomen) OPEN CHOLECYSTECTOMY (N/A Abdomen)  Patient location during evaluation: PACU Anesthesia Type: General Level of consciousness: awake, oriented, awake and alert and patient cooperative Pain management: pain level controlled Vital Signs Assessment: post-procedure vital signs reviewed and stable Respiratory status: spontaneous breathing, respiratory function stable, nonlabored ventilation and patient connected to face mask oxygen Cardiovascular status: stable Postop Assessment: no apparent nausea or vomiting Anesthetic complications: no     Last Vitals:  Vitals:   05/25/19 1011 05/25/19 1253  BP:  (!) 109/57  Pulse:  66  Resp:  (!) 28  Temp:  (!) 36.3 C  SpO2: 98% 96%    Last Pain:  Vitals:   05/25/19 1253  TempSrc: Oral  PainSc: 0-No pain                 Wajiha Versteeg

## 2019-05-25 NOTE — Progress Notes (Signed)
[facility]  Patient well known to me with a cholecystostomy tube in place since January 2020 for acute cholecystitis with septic shock. He subsequently underwent a ex lap for small bowel obstruction with pneumatosis and had reduction of a small bowel volvulus causing obstruction but no resection.   We was actually on the scheduled for a laparoscopic cholecystectomy to get his cholecystostomy tube out but presented to the hospital with worsening abdominal distention and CT with pneumatosis of the stomach and small bowel and port venous gas.  Given the pneumatosis he was made NPO and NG was placed. He was started on Zosyn. His exam has been benign and my partner saw him this weekend.  I have looked at his imaging and discussed the case with his aunt. I think he needs an open exploration to ensure nothing is missed and get his cholecystomy tube out at the same time.   Plan for Ex lap upper midline and run the bowel and take out the gallbladder. At risk for SNF placement.   BP (!) 109/57   Pulse 66   Temp (!) 97.4 F (36.3 C) (Oral)   Resp (!) 28   Ht 5\' 7"  (1.702 m)   Wt 66.8 kg   SpO2 96%   BMI 23.07 kg/m  NAD Normal work of breathing Soft, nondistended, scaphoid almost, non rebound or guarding, cholecystostomy tube in place with drainage  MAE  , MD [facility] [address] [place], [place] [address] [phone] (office)

## 2019-05-25 NOTE — Care Management Important Message (Signed)
Important Message  Patient Details  Name: [name] MRN: [id number] Date of Birth: May 31, 1950   Medicare Important Message Given:  Yes     [name] 05/25/2019, 3:45 PM

## 2019-05-25 NOTE — Progress Notes (Signed)
PROGRESS NOTE    KINAN SAFLEY  FYB:[id number] DOB: 10-29-1950 DOA: 05/23/2019 PCP: Jani Gravel, MD    Brief Narrative:  HPI per Dr. Carlisle Cater  is a 69 y.o. male, with history of mental retardation, urinary retention, volvulus, had expiratory laparotomy with reduction of volvulus on 08/26/2018, patient have chronic abdominal distention due to intestinal dysmotility, s/p cholecystostomy tube for cholecystitis in January 2020, recently cholecystostomy drain was replaced by surgery, today was brought to the ED after abdominal distention became worse.  Also multiple episodes of dry heaving. In the ED CT scan of the abdomen and pelvis showed moderate to marked distention of the stomach and small bowel with pneumatosis within the stomach wall and within the  small bowel loop walls.  NG tube placed in the ED.  General surgery consulted. Denies chest pain or shortness of breath. Complains of abdominal pain   Assessment & Plan:   Active Problems:   Diabetes mellitus type 2, noninsulin dependent (HCC)   BPH (benign prostatic hyperplasia)   Abdominal distension   Pneumatosis intestinalis   Chronic cholecystitis   1. Abdominal distention, ileus versus small bowel obstruction.  Noted to have pneumatosis in stomach.  Seen by general surgery and underwent operative management on 2/8.  On intravenous antibiotics.  Patient is having some loose stools which may be resolution of his stool burden.  NG tube has been removed. Further post op care per General surgery 2. Chronic cholecystitis/cholelithiasis.  s/p cholecystectomy on 2/8.  General surgery following. 3. BPH.  Patient has history of urinary retention and does in and out cath 3 times a day.  This will be continued in the hospital.  Resume Flomax/finasteride when able to take p.o. 4. Diabetes.  Holding Metformin.  Continue sliding scale insulin.  Follow CBGs. 5. Functional status.  Patient's family reports that at baseline, he ambulates with  the assistance of a walker. Will request PT eval   DVT prophylaxis: Lovenox Code Status: Full code Family Communication: No family present Disposition Plan: Discharge home when cleared by surgery   Consultants:   General surgery  Procedures:     Antimicrobials:   Zosyn 2/6 >   Subjective: Nonverbal. Patient seen in room postoperatively. Does not seem uncomfortable  Objective: Vitals:   05/25/19 1645 05/25/19 1700 05/25/19 1710 05/25/19 1723  BP: 107/63 111/65  (!) 93/53  Pulse: (!) 108 (!) 102 100 99  Resp: 16 15  18   Temp:    98.2 F (36.8 C)  TempSrc:      SpO2: 93% 98%  98%  Weight:      Height:        Intake/Output Summary (Last 24 hours) at 05/25/2019 2007 Last data filed at 05/25/2019 1800 Gross per 24 hour  Intake 3886.86 ml  Output 2050 ml  Net 1836.86 ml   Filed Weights   05/23/19 0222 05/25/19 0501  Weight: 67.5 kg 66.8 kg    Examination:  General exam: Alert, awake, no distress Respiratory system: Clear to auscultation. Respiratory effort normal. Cardiovascular system:RRR. No murmurs, rubs, gallops. Gastrointestinal system: Abdomen is distended, soft and nontender. No organomegaly or masses felt. Normal bowel sounds heard. Central nervous system: No focal neurological deficits. Extremities: No C/C/E, +pedal pulses Skin: No rashes, lesions or ulcers Psychiatry: nonverbal.    Data Reviewed: I have personally reviewed following labs and imaging studies  CBC: Recent Labs  Lab 05/23/19 0311 05/24/19 0616  WBC 9.4 8.8  NEUTROABS 8.4*  --   HGB 13.6 11.2*  HCT 45.1 36.7*  MCV 86.7 86.4  PLT 243 196   Basic Metabolic Panel: Recent Labs  Lab 05/23/19 0311 05/24/19 0616 05/24/19 2211  NA 136 141  --   K 4.2 3.7  --   CL 99 103  --   CO2 26 28  --   GLUCOSE 226* 126* 208*  BUN 24* 26*  --   CREATININE 1.22 1.14  --   CALCIUM 10.2 8.9  --    GFR: Estimated Creatinine Clearance: 57.2 mL/min (by C-G formula based on SCr of 1.14  mg/dL). Liver Function Tests: Recent Labs  Lab 05/23/19 0311 05/24/19 0616  AST 18 11*  ALT 14 9  ALKPHOS 96 65  BILITOT 0.5 0.8  PROT 9.5* 6.8  ALBUMIN 4.8 3.1*   No results for input(s): LIPASE, AMYLASE in the last 168 hours. No results for input(s): AMMONIA in the last 168 hours. Coagulation Profile: No results for input(s): INR, PROTIME in the last 168 hours. Cardiac Enzymes: No results for input(s): CKTOTAL, CKMB, CKMBINDEX, TROPONINI in the last 168 hours. BNP (last 3 results) No results for input(s): PROBNP in the last 8760 hours. HbA1C: Recent Labs    05/23/19 0311  HGBA1C 7.4*   CBG: Recent Labs  Lab 05/25/19 0710 05/25/19 1108 05/25/19 1300 05/25/19 1628 05/25/19 2006  GLUCAP 117* 121* 106* 135* 167*   Lipid Profile: No results for input(s): CHOL, HDL, LDLCALC, TRIG, CHOLHDL, LDLDIRECT in the last 72 hours. Thyroid Function Tests: No results for input(s): TSH, T4TOTAL, FREET4, T3FREE, THYROIDAB in the last 72 hours. Anemia Panel: No results for input(s): VITAMINB12, FOLATE, FERRITIN, TIBC, IRON, RETICCTPCT in the last 72 hours. Sepsis Labs: No results for input(s): PROCALCITON, LATICACIDVEN in the last 168 hours.  Recent Results (from the past 240 hour(s))  SARS CORONAVIRUS 2 (TAT 6-24 HRS) Nasopharyngeal Nasopharyngeal Swab     Status: None   Collection Time: 05/24/19  7:38 PM   Specimen: Nasopharyngeal Swab  Result Value Ref Range Status   SARS Coronavirus 2 NEGATIVE NEGATIVE Final    Comment: (NOTE) SARS-CoV-2 target nucleic acids are NOT DETECTED. The SARS-CoV-2 RNA is generally detectable in upper and lower respiratory specimens during the acute phase of infection. Negative results do not preclude SARS-CoV-2 infection, do not rule out co-infections with other pathogens, and should not be used as the sole basis for treatment or other patient management decisions. Negative results must be combined with clinical observations, patient history,  and epidemiological information. The expected result is Negative. Fact Sheet for Patients: HairSlick.no Fact Sheet for Healthcare Providers: quierodirigir.com This test is not yet approved or cleared by the [place] FDA and  has been authorized for detection and/or diagnosis of SARS-CoV-2 by FDA under an Emergency Use Authorization (EUA). This EUA will remain  in effect (meaning this test can be used) for the duration of the COVID-19 declaration under Section 56 4(b)(1) of the Act, 21 U.S.C. section 360bbb-3(b)(1), unless the authorization is terminated or revoked sooner. Performed at [facility], [address] [place], [place] [address]   Surgical PCR screen     Status: None   Collection Time: 05/24/19  8:48 PM   Specimen: Nasopharyngeal Swab; Nasal Swab  Result Value Ref Range Status   MRSA, PCR NEGATIVE NEGATIVE Final   Staphylococcus aureus NEGATIVE NEGATIVE Final    Comment: (NOTE) The Xpert SA Assay (FDA approved for NASAL specimens in patients 31 years of age and older), is one component of a comprehensive surveillance  program. It is not intended to diagnose infection nor to guide or monitor treatment. Performed at [facility], [address] [place], [place] [address]   Respiratory Panel by RT PCR (Flu A&B, Covid) - Nasopharyngeal Swab     Status: None   Collection Time: 05/25/19 10:23 AM   Specimen: Nasopharyngeal Swab  Result Value Ref Range Status   SARS Coronavirus 2 by RT PCR NEGATIVE NEGATIVE Final    Comment: (NOTE) SARS-CoV-2 target nucleic acids are NOT DETECTED. The SARS-CoV-2 RNA is generally detectable in upper respiratoy specimens during the acute phase of infection. The lowest concentration of SARS-CoV-2 viral copies this assay can detect is 131 copies/mL. A negative result does not preclude SARS-Cov-2 infection and should not be used as the sole basis for treatment or other patient  management decisions. A negative result may occur with  improper specimen collection/handling, submission of specimen other than nasopharyngeal swab, presence of viral mutation(s) within the areas targeted by this assay, and inadequate number of viral copies (<131 copies/mL). A negative result must be combined with clinical observations, patient history, and epidemiological information. The expected result is Negative. Fact Sheet for Patients:  https://www.moore.com/ Fact Sheet for Healthcare Providers:  https://www.young.biz/ This test is not yet ap proved or cleared by the [place] FDA and  has been authorized for detection and/or diagnosis of SARS-CoV-2 by FDA under an Emergency Use Authorization (EUA). This EUA will remain  in effect (meaning this test can be used) for the duration of the COVID-19 declaration under Section 564(b)(1) of the Act, 21 U.S.C. section 360bbb-3(b)(1), unless the authorization is terminated or revoked sooner.    Influenza A by PCR NEGATIVE NEGATIVE Final   Influenza B by PCR NEGATIVE NEGATIVE Final    Comment: (NOTE) The Xpert Xpress SARS-CoV-2/FLU/RSV assay is intended as an aid in  the diagnosis of influenza from Nasopharyngeal swab specimens and  should not be used as a sole basis for treatment. Nasal washings and  aspirates are unacceptable for Xpert Xpress SARS-CoV-2/FLU/RSV  testing. Fact Sheet for Patients: https://www.moore.com/ Fact Sheet for Healthcare Providers: https://www.young.biz/ This test is not yet approved or cleared by the [place] FDA and  has been authorized for detection and/or diagnosis of SARS-CoV-2 by  FDA under an Emergency Use Authorization (EUA). This EUA will remain  in effect (meaning this test can be used) for the duration of the  Covid-19 declaration under Section 564(b)(1) of the Act, 21  U.S.C. section 360bbb-3(b)(1), unless the  authorization is  terminated or revoked. Performed at [facility], [address] [place], [place] [address]          Radiology Studies: No results found.      Scheduled Meds: . acetaminophen  650 mg Oral Q6H  . enoxaparin (LOVENOX) injection  40 mg Subcutaneous Q24H  . insulin aspart  0-9 Units Subcutaneous Q4H   Continuous Infusions: . lactated ringers 100 mL/hr at 05/25/19 1737  . piperacillin-tazobactam (ZOSYN)  IV       LOS: 2 days    Time spent:    Erick Blinks, MD [facility]   If 7PM-7AM, please contact night-coverage [email or web address]  05/25/2019, 8:07 PM

## 2019-05-25 NOTE — Progress Notes (Signed)
Second NG removed by patient with mitts on. MD notified and instructed to reinsert NG tube again. NG 16 Fr inserted to right nares. Upon inserting third NG tube. Patient immediately pulled it on. Contacted MD and instructed to hold NG for now. Abdomen is soft. Hyperactive bowel sounds. Patient is calm and resting in bed at this time.

## 2019-05-25 NOTE — Progress Notes (Signed)
NG tube removed by patient. Reinserted NG tube 16 Fr to right nares measuring 22cm. Xray placed for placement. MD notified.

## 2019-05-25 NOTE — Transfer of Care (Signed)
Immediate Anesthesia Transfer of Care Note  Patient: [name]  Procedure(s) Performed: EXPLORATORY LAPAROTOMY (N/A Abdomen) OPEN CHOLECYSTECTOMY (N/A Abdomen)  Patient Location: PACU  Anesthesia Type:MAC  Level of Consciousness: awake, alert , oriented and patient cooperative  Airway & Oxygen Therapy: Patient Spontanous Breathing and Patient connected to face mask oxygen  Post-op Assessment: Report given to RN, Post -op Vital signs reviewed and stable and Patient moving all extremities X 4  Post vital signs: Reviewed and stable  Last Vitals:  Vitals Value Taken Time  BP 117/63 05/25/19 1625  Temp    Pulse 97 05/25/19 1626  Resp 13 05/25/19 1626  SpO2 100 % 05/25/19 1626  Vitals shown include unvalidated device data.  Last Pain:  Vitals:   05/25/19 1253  TempSrc: Oral  PainSc: 0-No pain         Complications: No apparent anesthesia complications

## 2019-05-25 NOTE — Plan of Care (Signed)

## 2019-05-25 NOTE — Progress Notes (Signed)
[facility]  Spoke with [name] this AM and fixed consent orders for surgery.  She will be there at 10 am to sign them. Patient need Ex lap, cholecystectomy and possible bowel resection for cholecystostomy tube in place, chronic cholecystitis and pneumatosis of the bowel.  Discussed with [name] need for open surgery due to distention and pneumatosis of the bowel and risk of needing to remove some discussed. Discussed again the risk of cholecystectomy such as bowel injury and bile duct injury and discussed overall risk of bleeding and infection. She expressed understanding.  Ms. [name] also realizes that [name] is getting older and this may be is body telling us that he is getting older and entering the dying process. We discussed given the open procedure more likelihood of needing/ SNF and Rehab.  Will update RN.   Levi Greenhouse, MD [facility] [address] [name], [place] [address] [phone]/ [phone] (office)

## 2019-05-25 NOTE — Op Note (Signed)
[facility] Operative Note  05/25/19  Preoperative Diagnosis:  Chronic cholecystitis with cholecystostomy tube in place; pneumatosis of the stomach and small bowel    Postoperative Diagnosis: Chronic cholecystitis with cholecystostomy tube; healthy stomach and small bowel with dilation    Procedure(s) Performed: Exploratory laparotomy, running of bowel, open cholecystectomy    Surgeon: Lanell Matar. Constance Haw, MD   Assistants: Aviva Signs, MD     Anesthesia: General endotracheal   Anesthesiologist: Denese Killings, MD    Specimens:  Gallbladder   Estimated Blood Loss: 150cc    Blood Replacement: None     Complications: None   Wound Class: Clean contaminated    Operative Indications:  Mr. Bristol is a 69 yo with a history of cholecystostomy tube in place for over 1 year with chronic cholecystitis who presented to the hospital with pneumatosis and dilated stomach and small bowel. He was brought into the hospital for NG placement and IV antibiotics was his abdominal exam and labs were benign. He needed to get his cholecystostomy tube removed and had plans for an elective procedure, and given the pneumatosis, I discussed with his aunt, [name] a place for an open cholecystectomy to look at the bowel while removing the gallbladder. We discussed the risk of bleeding, infection, injury to the bile system, need for bowel resection, and need for an open procedure. She opted to proceed.   Findings: Contracted gallbladder with short cystic duct and significant adhesions to the gallbladder; dilated healthy small bowel with ileum dilation of greater than 10cm and normal caliber bowel proximal, normal appearing colon but redundant, normal appearing stomach, no signs of perforation    Procedure: The patient was taken to the operating room and placed supine. General endotracheal anesthesia was induced. Intravenous antibiotics were administered per protocol.  A nasogastric tube  positioned to decompress the stomach. A foley catheter was placed into the bladder. The abdomen was prepared and draped in the usual sterile fashion.  The cholecystostomy tube was prepped in place at the entry site left exposed by the under drape and covered with the main drape.   A midline upper abdominal incision was made above the prior midline incision in order to remove the gallbladder and also run the bowel.  The abdomen was very lax from his chronic distention and I felt that this incision would make the most sense.  The incision was carried down through to the fascia and the peritoneum was entered with care with scissors. A wound protector was placed. The small bowel was eviscerated and ran from the terminal ileum to the ligament of treitz. The bowel was healthy and pink with the distal small bowel being dilated to 10cm plus and the proximal bowel being normal caliber.  The stomach was healthy and no signs of perforation. The colon was healthy but redundant. There was no signs of perforation or contamination in the abdomen and the bowel was all healthy.  The patient was placed with his right side up slightly and the bowel was packed into the left quadrant with laparotomy pads and retraction with my hand as much as possible.   There was significant scarring anteriorly along the gallbladder and stomach with tenting up of the duodenum due to the laxity of the bowel and redundancy. My assistant helped to retract the abdominal wall to the right. I was able to get around the cholecystostomy tube tunnel, and was able to cut and remove the cholecystostomy tube and come across the tunnel. The dome of  the gallbladder was retracted toward me and I started to dome down. At about the mid gallbladder, there was bleeding from thel liver edge laterally and this was controlled, but I was concerned we were getting into the liver.  We opted to go back to further dissect out the cystic duct and cystic artery.     Superficial rind was carefully dissected out with a right angle and scissors. Ultimately working on the lateral inferior edge of the gallbladder with blunt dissection with a peanut, I was able to visualize the common bile duct and the cystic duct. There was significant woody scar in this area, and it took a while to get this fully dissected out.  From there the cystic artery was also dissected out, and we could clearly see the common bile duct inferiorly.  To further prove that this was truly the cystic duct and artery, we completed the dome down as we were able to get back into the correct plane in the gallbladder fossa medially and worked to connect it down to the inferior portion of the gallbladder. The cystic duct and cystic artery were noted and the gallbladder was on a pedicle. Both were clipped and divided with a large clip applier.   All laparotomy pads were removed. The bowel was placed back in anastomotic position and the abdomen was irrigated.  The hepatic bed was cauterized and [facility] and [facility] Jamelle Haring was placed to help with hemostasis.  Final inspection demonstrated adequate hemostasis.  The abdominal wall was closed with 0 PDS in the running fashion. The skin was closed with staples. The cholecystostomy tube site had significant granulation, and this was excised in addition to a portion of the subcutaneous tissue. This was closed loosely with staples. Sterile dressings were applied.   The patient's NG and foley were removed.   Dr. Lovell Sheehan was assisting throughout the procedure and was present for the critical portions of the case.   All counts were correct at the end of the case. The patient was awakened from anesthesia and extubated without complication.  The patient went to the PACU in stable condition.   Algis Greenhouse, MD [facility] [address] [place], [place] [address] [phone] (office)

## 2019-05-26 DIAGNOSIS — K811 Chronic cholecystitis: Secondary | ICD-10-CM | POA: Diagnosis not present

## 2019-05-26 DIAGNOSIS — R140 Abdominal distension (gaseous): Secondary | ICD-10-CM | POA: Diagnosis not present

## 2019-05-26 DIAGNOSIS — E119 Type 2 diabetes mellitus without complications: Secondary | ICD-10-CM | POA: Diagnosis not present

## 2019-05-26 DIAGNOSIS — K8010 Calculus of gallbladder with chronic cholecystitis without obstruction: Secondary | ICD-10-CM | POA: Diagnosis not present

## 2019-05-26 DIAGNOSIS — N401 Enlarged prostate with lower urinary tract symptoms: Secondary | ICD-10-CM | POA: Diagnosis not present

## 2019-05-26 LAB — GLUCOSE, CAPILLARY
Glucose-Capillary: 105 mg/dL — ABNORMAL HIGH (ref 70–99)
Glucose-Capillary: 131 mg/dL — ABNORMAL HIGH (ref 70–99)
Glucose-Capillary: 137 mg/dL — ABNORMAL HIGH (ref 70–99)
Glucose-Capillary: 144 mg/dL — ABNORMAL HIGH (ref 70–99)
Glucose-Capillary: 15 mg/dL — CL (ref 70–99)
Glucose-Capillary: 15 mg/dL — CL (ref 70–99)
Glucose-Capillary: 15 mg/dL — CL (ref 70–99)
Glucose-Capillary: 15 mg/dL — CL (ref 70–99)
Glucose-Capillary: 15 mg/dL — CL (ref 70–99)
Glucose-Capillary: 159 mg/dL — ABNORMAL HIGH (ref 70–99)
Glucose-Capillary: 87 mg/dL (ref 70–99)

## 2019-05-26 LAB — CBC WITH DIFFERENTIAL/PLATELET
Abs Immature Granulocytes: 0.01 10*3/uL (ref 0.00–0.07)
Basophils Absolute: 0 10*3/uL (ref 0.0–0.1)
Basophils Relative: 0 %
Eosinophils Absolute: 0.1 10*3/uL (ref 0.0–0.5)
Eosinophils Relative: 1 %
HCT: 33.9 % — ABNORMAL LOW (ref 39.0–52.0)
Hemoglobin: 9.9 g/dL — ABNORMAL LOW (ref 13.0–17.0)
Immature Granulocytes: 0 %
Lymphocytes Relative: 11 %
Lymphs Abs: 0.9 10*3/uL (ref 0.7–4.0)
MCH: 26.1 pg (ref 26.0–34.0)
MCHC: 29.2 g/dL — ABNORMAL LOW (ref 30.0–36.0)
MCV: 89.2 fL (ref 80.0–100.0)
Monocytes Absolute: 0.6 10*3/uL (ref 0.1–1.0)
Monocytes Relative: 7 %
Neutro Abs: 6.4 10*3/uL (ref 1.7–7.7)
Neutrophils Relative %: 81 %
Platelets: 144 10*3/uL — ABNORMAL LOW (ref 150–400)
RBC: 3.8 MIL/uL — ABNORMAL LOW (ref 4.22–5.81)
RDW: 17 % — ABNORMAL HIGH (ref 11.5–15.5)
WBC: 7.9 10*3/uL (ref 4.0–10.5)
nRBC: 0 % (ref 0.0–0.2)

## 2019-05-26 LAB — COMPREHENSIVE METABOLIC PANEL
ALT: 32 U/L (ref 0–44)
AST: 44 U/L — ABNORMAL HIGH (ref 15–41)
Albumin: 2.7 g/dL — ABNORMAL LOW (ref 3.5–5.0)
Alkaline Phosphatase: 46 U/L (ref 38–126)
Anion gap: 8 (ref 5–15)
BUN: 17 mg/dL (ref 8–23)
CO2: 27 mmol/L (ref 22–32)
Calcium: 8.1 mg/dL — ABNORMAL LOW (ref 8.9–10.3)
Chloride: 105 mmol/L (ref 98–111)
Creatinine, Ser: 0.96 mg/dL (ref 0.61–1.24)
GFR calc Af Amer: >60 mL/min (ref >60)
GFR calc non Af Amer: >60 mL/min (ref >60)
Glucose, Bld: 127 mg/dL — ABNORMAL HIGH (ref 70–99)
Potassium: 3.8 mmol/L (ref 3.5–5.1)
Sodium: 140 mmol/L (ref 135–145)
Total Bilirubin: 0.7 mg/dL (ref 0.3–1.2)
Total Protein: 5.9 g/dL — ABNORMAL LOW (ref 6.5–8.1)

## 2019-05-26 MED ORDER — SIMETHICONE 40 MG/0.6ML PO SUSP
40.0000 mg | Freq: Four times a day (QID) | ORAL | Status: DC
  Administered 2019-05-26 (×3): 40 mg via ORAL
  Filled 2019-05-26: qty 0.6

## 2019-05-26 MED ORDER — DOCUSATE SODIUM 100 MG PO CAPS
100.0000 mg | ORAL_CAPSULE | Freq: Two times a day (BID) | ORAL | Status: DC
  Administered 2019-05-26 – 2019-05-28 (×5): 100 mg via ORAL
  Filled 2019-05-26 (×5): qty 1

## 2019-05-26 NOTE — Progress Notes (Signed)
PROGRESS NOTE    [name]  NAT:[id number] DOB: Sep 24, 1950 DOA: 05/23/2019 PCP: Jani Gravel, MD    Brief Narrative:  HPI per Dr. Carlisle Cater  is a 69 y.o. male, with history of mental retardation, urinary retention, volvulus, had expiratory laparotomy with reduction of volvulus on 08/26/2018, patient have chronic abdominal distention due to intestinal dysmotility, s/p cholecystostomy tube for cholecystitis in January 2020, recently cholecystostomy drain was replaced by surgery, today was brought to the ED after abdominal distention became worse.  Also multiple episodes of dry heaving. In the ED CT scan of the abdomen and pelvis showed moderate to marked distention of the stomach and small bowel with pneumatosis within the stomach wall and within the  small bowel loop walls.  NG tube placed in the ED.  General surgery consulted. Denies chest pain or shortness of breath. Complains of abdominal pain   Assessment & Plan:   Active Problems:   Diabetes mellitus type 2, noninsulin dependent (HCC)   BPH (benign prostatic hyperplasia)   Abdominal distension   Pneumatosis intestinalis   Chronic cholecystitis   1. Abdominal distention, ileus versus small bowel obstruction.  Noted to have pneumatosis in stomach.  Seen by general surgery and underwent operative management on 2/8.  On intravenous antibiotics.  Can likely discontinue antibiotics tomorrow. Further post op care per General surgery 2. Chronic cholecystitis/cholelithiasis.  s/p cholecystectomy on 2/8.  General surgery following. 3. BPH.  Patient has history of urinary retention and does in and out cath 3 times a day.  This will be continued in the hospital.  Resume Flomax/finasteride when able to take p.o. 4. Diabetes.  Holding Metformin.  Continue sliding scale insulin.  Follow CBGs. 5. Functional status.  Patient's family reports that at baseline, he ambulates with the assistance of a walker. Will request PT eval   DVT  prophylaxis: Lovenox Code Status: Full code Family Communication: No family present Disposition Plan: Discharge home when cleared by surgery, possibly in AM   Consultants:   General surgery  Procedures:     Antimicrobials:   Zosyn 2/6 >   Subjective: Does not appear uncomfortable. He is nonverbal  Objective: Vitals:   05/25/19 2110 05/26/19 0532 05/26/19 1416 05/26/19 2005  BP: (!) 98/53 (!) 104/50 98/67 115/66  Pulse: 95 91 89 97  Resp: 18 18 18 20   Temp: 97.8 F (36.6 C) 97.9 F (36.6 C) 98 F (36.7 C) 98.7 F (37.1 C)  TempSrc: Oral Oral Oral Oral  SpO2: 100% 100% 98% 96%  Weight:      Height:        Intake/Output Summary (Last 24 hours) at 05/26/2019 2203 Last data filed at 05/26/2019 1100 Gross per 24 hour  Intake 1267.54 ml  Output 450 ml  Net 817.54 ml   Filed Weights   05/23/19 0222 05/25/19 0501  Weight: 67.5 kg 66.8 kg    Examination: General exam: Alert, awake, no distress Respiratory system: Clear to auscultation. Respiratory effort normal. Cardiovascular system:RRR. No murmurs, rubs, gallops. Gastrointestinal system: Abdomen is distended, soft and nontender. No organomegaly or masses felt. Normal bowel sounds heard. Central nervous system: No focal neurological deficits. Extremities: No C/C/E, +pedal pulses Skin: No rashes, lesions or ulcers Psychiatry: nonverbal    Data Reviewed: I have personally reviewed following labs and imaging studies  CBC: Recent Labs  Lab 05/23/19 0311 05/24/19 0616 05/26/19 0500  WBC 9.4 8.8 7.9  NEUTROABS 8.4*  --  6.4  HGB 13.6 11.2* 9.9*  HCT 45.1  36.7* 33.9*  MCV 86.7 86.4 89.2  PLT 243 196 144*   Basic Metabolic Panel: Recent Labs  Lab 05/23/19 0311 05/24/19 0616 05/24/19 2211 05/26/19 0500  NA 136 141  --  140  K 4.2 3.7  --  3.8  CL 99 103  --  105  CO2 26 28  --  27  GLUCOSE 226* 126* 208* 127*  BUN 24* 26*  --  17  CREATININE 1.22 1.14  --  0.96  CALCIUM 10.2 8.9  --  8.1*    GFR: Estimated Creatinine Clearance: 67.9 mL/min (by C-G formula based on SCr of 0.96 mg/dL). Liver Function Tests: Recent Labs  Lab 05/23/19 0311 05/24/19 0616 05/26/19 0500  AST 18 11* 44*  ALT 14 9 32  ALKPHOS 96 65 46  BILITOT 0.5 0.8 0.7  PROT 9.5* 6.8 5.9*  ALBUMIN 4.8 3.1* 2.7*   No results for input(s): LIPASE, AMYLASE in the last 168 hours. No results for input(s): AMMONIA in the last 168 hours. Coagulation Profile: No results for input(s): INR, PROTIME in the last 168 hours. Cardiac Enzymes: No results for input(s): CKTOTAL, CKMB, CKMBINDEX, TROPONINI in the last 168 hours. BNP (last 3 results) No results for input(s): PROBNP in the last 8760 hours. HbA1C: No results for input(s): HGBA1C in the last 72 hours. CBG: Recent Labs  Lab 05/26/19 0341 05/26/19 0730 05/26/19 1120 05/26/19 1624 05/26/19 2007  GLUCAP 105* 137* 159* 131* 144*   Lipid Profile: No results for input(s): CHOL, HDL, LDLCALC, TRIG, CHOLHDL, LDLDIRECT in the last 72 hours. Thyroid Function Tests: No results for input(s): TSH, T4TOTAL, FREET4, T3FREE, THYROIDAB in the last 72 hours. Anemia Panel: No results for input(s): VITAMINB12, FOLATE, FERRITIN, TIBC, IRON, RETICCTPCT in the last 72 hours. Sepsis Labs: No results for input(s): PROCALCITON, LATICACIDVEN in the last 168 hours.  Recent Results (from the past 240 hour(s))  SARS CORONAVIRUS 2 (TAT 6-24 HRS) Nasopharyngeal Nasopharyngeal Swab     Status: None   Collection Time: 05/24/19  7:38 PM   Specimen: Nasopharyngeal Swab  Result Value Ref Range Status   SARS Coronavirus 2 NEGATIVE NEGATIVE Final    Comment: (NOTE) SARS-CoV-2 target nucleic acids are NOT DETECTED. The SARS-CoV-2 RNA is generally detectable in upper and lower respiratory specimens during the acute phase of infection. Negative results do not preclude SARS-CoV-2 infection, do not rule out co-infections with other pathogens, and should not be used as the sole basis  for treatment or other patient management decisions. Negative results must be combined with clinical observations, patient history, and epidemiological information. The expected result is Negative. Fact Sheet for Patients: HairSlick.no Fact Sheet for Healthcare Providers: quierodirigir.com This test is not yet approved or cleared by the [place] FDA and  has been authorized for detection and/or diagnosis of SARS-CoV-2 by FDA under an Emergency Use Authorization (EUA). This EUA will remain  in effect (meaning this test can be used) for the duration of the COVID-19 declaration under Section 56 4(b)(1) of the Act, 21 U.S.C. section 360bbb-3(b)(1), unless the authorization is terminated or revoked sooner. Performed at [facility], [address], [place], [place] [address]   Surgical PCR screen     Status: None   Collection Time: 05/24/19  8:48 PM   Specimen: Nasopharyngeal Swab; Nasal Swab  Result Value Ref Range Status   MRSA, PCR NEGATIVE NEGATIVE Final   Staphylococcus aureus NEGATIVE NEGATIVE Final    Comment: (NOTE) The Xpert SA Assay (FDA approved for  NASAL specimens in patients 7 years of age and older), is one component of a comprehensive surveillance program. It is not intended to diagnose infection nor to guide or monitor treatment. Performed at [facility], [address] [place], [place] [address]   Respiratory Panel by RT PCR (Flu A&B, Covid) - Nasopharyngeal Swab     Status: None   Collection Time: 05/25/19 10:23 AM   Specimen: Nasopharyngeal Swab  Result Value Ref Range Status   SARS Coronavirus 2 by RT PCR NEGATIVE NEGATIVE Final    Comment: (NOTE) SARS-CoV-2 target nucleic acids are NOT DETECTED. The SARS-CoV-2 RNA is generally detectable in upper respiratoy specimens during the acute phase of infection. The lowest concentration of SARS-CoV-2 viral copies this assay can detect is 131 copies/mL. A  negative result does not preclude SARS-Cov-2 infection and should not be used as the sole basis for treatment or other patient management decisions. A negative result may occur with  improper specimen collection/handling, submission of specimen other than nasopharyngeal swab, presence of viral mutation(s) within the areas targeted by this assay, and inadequate number of viral copies (<131 copies/mL). A negative result must be combined with clinical observations, patient history, and epidemiological information. The expected result is Negative. Fact Sheet for Patients:  https://www.moore.com/ Fact Sheet for Healthcare Providers:  https://www.young.biz/ This test is not yet ap proved or cleared by the [place] FDA and  has been authorized for detection and/or diagnosis of SARS-CoV-2 by FDA under an Emergency Use Authorization (EUA). This EUA will remain  in effect (meaning this test can be used) for the duration of the COVID-19 declaration under Section 564(b)(1) of the Act, 21 U.S.C. section 360bbb-3(b)(1), unless the authorization is terminated or revoked sooner.    Influenza A by PCR NEGATIVE NEGATIVE Final   Influenza B by PCR NEGATIVE NEGATIVE Final    Comment: (NOTE) The Xpert Xpress SARS-CoV-2/FLU/RSV assay is intended as an aid in  the diagnosis of influenza from Nasopharyngeal swab specimens and  should not be used as a sole basis for treatment. Nasal washings and  aspirates are unacceptable for Xpert Xpress SARS-CoV-2/FLU/RSV  testing. Fact Sheet for Patients: https://www.moore.com/ Fact Sheet for Healthcare Providers: https://www.young.biz/ This test is not yet approved or cleared by the [place] FDA and  has been authorized for detection and/or diagnosis of SARS-CoV-2 by  FDA under an Emergency Use Authorization (EUA). This EUA will remain  in effect (meaning this test can be used) for  the duration of the  Covid-19 declaration under Section 564(b)(1) of the Act, 21  U.S.C. section 360bbb-3(b)(1), unless the authorization is  terminated or revoked. Performed at [facility], [address], [place], [place] [address]          Radiology Studies: No results found.      Scheduled Meds: . acetaminophen  650 mg Oral Q6H  . docusate sodium  100 mg Oral BID  . enoxaparin (LOVENOX) injection  40 mg Subcutaneous Q24H  . insulin aspart  0-9 Units Subcutaneous Q4H  . simethicone  40 mg Oral QID   Continuous Infusions: . lactated ringers 100 mL/hr at 05/26/19 0249     LOS: 3 days    Time spent:    Erick Blinks, MD [facility]   If 7PM-7AM, please contact night-coverage [email or web address]  05/26/2019, 10:03 PM

## 2019-05-26 NOTE — Plan of Care (Signed)
  Problem: Health Behavior/Discharge Planning: Goal: Ability to manage health-related needs will improve Outcome: Progressing   Problem: Nutrition: Goal: Adequate nutrition will be maintained Outcome: Progressing   Problem: Elimination: Goal: Will not experience complications related to bowel motility Outcome: Progressing   Problem: Pain Managment: Goal: General experience of comfort will improve Outcome: Progressing   Problem: Safety: Goal: Ability to remain free from injury will improve Outcome: Progressing   Problem: Skin Integrity: Goal: Risk for impaired skin integrity will decrease Outcome: Progressing   Problem: Activity: Goal: Risk for activity intolerance will decrease Outcome: Progressing   Problem: Nutrition: Goal: Adequate nutrition will be maintained Outcome: Progressing   Problem: Safety: Goal: Ability to remain free from injury will improve Outcome: Progressing

## 2019-05-26 NOTE — Progress Notes (Signed)
Pharmacy Antibiotic Note  [name] is a 69 y.o. male admitted on 05/23/2019 with intra-abdominal infection.  Pharmacy has been consulted for zosyn dosing.  Plan: Continue Zosyn 3.375g IV q8h.  F/U cxs and clinical progress Monitor V/S, labs  Height: 5\' 7"  (170.2 cm) Weight: 147 lb 4.3 oz (66.8 kg) IBW/kg (Calculated) : 66.1  Temp (24hrs), Avg:97.6 F (36.4 C), Min:96.6 F (35.9 C), Max:98.2 F (36.8 C)  Recent Labs  Lab 05/23/19 0311 05/24/19 0616 05/26/19 0500  WBC 9.4 8.8 7.9  CREATININE 1.22 1.14 0.96    Estimated Creatinine Clearance: 67.9 mL/min (by C-G formula based on SCr of 0.96 mg/dL).    No Known Allergies  Antimicrobials this admission: Zosyn 2/6>>   Microbiology results: No cultures  Thank you for allowing pharmacy to be a part of this patient's care.  07/24/19, PharmD Clinical Pharmacist 05/26/2019 8:45 AM

## 2019-05-26 NOTE — TOC Initial Note (Signed)
Transition of Care [facility]) - Initial/Assessment Note    Patient Details  Name: [name] MRN: [id number] Date of Birth: [birth year]  Transition of Care [facility]) CM/SW Contact:    Sherie Don, LCSW Phone Number: 05/26/2019, 11:38 AM  Clinical Narrative: Patient admitted for abdominal distention. Patient has high risk for admission score. Currently lives with aunt, Lanney Gins, who is primary POC for his care. His aunt, Cyril Loosen, is also involved in his care and was consulted regarding setting up [facility] for RN and PT services following discharge. Referred to [place].              Expected Discharge Plan: [place] Barriers to Discharge: Continued Medical Work up  Patient Goals and CMS Choice        Expected Discharge Plan and Services Expected Discharge Plan: [place] Acute Care Choice: [place] arrangements for the past 2 months: Mobile Home                   HH Arranged: PT, RN [facility] Agency: [facility]) Date [place]: 05/26/19 Time [place]: 1120 Representative spoke with at [place]: [place]  Prior Living Arrangements/Services Living arrangements for the past 2 months: [place] with:: Other (Comment)(Lives with Conni Slipper and uncle) Patient language and need for interpreter reviewed:: Yes Do you feel safe going back to the place where you live?: Yes      Need for Family Participation in Patient Care: Yes (Comment)(Patient has IDD) Care giver support system in place?: Yes (comment)   Criminal Activity/Legal Involvement Pertinent to Current Situation/Hospitalization: No - Comment as needed  Activities of Daily Living Home Assistive Devices/Equipment: Walker (specify type)(front wheel walker) ADL Screening (condition at time of admission) Patient's cognitive ability adequate to safely complete daily activities?: No Is the patient deaf or have  difficulty hearing?: Yes Does the patient have difficulty seeing, even when wearing glasses/contacts?: No Does the patient have difficulty concentrating, remembering, or making decisions?: Yes Patient able to express need for assistance with ADLs?: No Does the patient have difficulty dressing or bathing?: Yes Independently performs ADLs?: No Communication: Dependent Is this a change from baseline?: Pre-admission baseline Dressing (OT): Dependent Is this a change from baseline?: Pre-admission baseline Grooming: Dependent Is this a change from baseline?: Pre-admission baseline Feeding: Independent Bathing: Dependent Is this a change from baseline?: Pre-admission baseline In/Out Bed: Needs assistance Is this a change from baseline?: Pre-admission baseline Walks in Home: Needs assistance Is this a change from baseline?: Pre-admission baseline Does the patient have difficulty walking or climbing stairs?: Yes Weakness of Legs: Both Weakness of Arms/Hands: Both  Permission Sought/Granted Permission sought to share information with : Family Supports     Emotional Assessment Appearance:: Well-Groomed Attitude/Demeanor/Rapport: Engaged Affect (typically observed): Accepting Orientation: : Oriented to Self Alcohol / Substance Use: Never Used Psych Involvement: No (comment)  Admission diagnosis:  Abdominal distension [R14.0] Patient Active Problem List   Diagnosis Date Noted  . Chronic cholecystitis 05/21/2019  . Chronic constipation 03/31/2019  . Cholecystostomy care ([place]) 11/04/2018  . Acute lower UTI 09/18/2018  . Abdominal distention 09/18/2018  . Hypomagnesemia 09/09/2018  . Protein-calorie malnutrition, severe ([place]) 09/09/2018  . Acute blood loss anemia 09/09/2018  . Gangrenous cholecystitis 09/05/2018  . Volvulus ([place]) 09/03/2018  . Ileus ([place])   . Pneumatosis intestinalis   . Gall bladder disease 08/25/2018  .  Small bowel ischemia (HCC)   . Acute gangrenous  cholecystitis 05/24/2018  . SBO (small bowel obstruction) (HCC) 05/24/2018  . Abdominal distension   . Pressure injury of skin 04/09/2018  . C. difficile diarrhea 04/08/2018  . Bilateral hydronephrosis 04/08/2018  . Pneumoperitoneum of unknown etiology   . Diarrhea 04/07/2018  . Nonverbal 04/07/2018  . Hydronephrosis with urinary obstruction due to ureteral calculus 06/20/2016  . Sepsis secondary to UTI (HCC) 06/20/2016  . Fever 06/19/2016  . Generalized weakness 06/19/2016  . Diabetes mellitus type 2, noninsulin dependent (HCC) 06/19/2016  . Severe sepsis with septic shock (HCC) 06/19/2016  . UTI (urinary tract infection) 06/19/2016  . Hematuria 06/19/2016  . AKI (acute kidney injury) (HCC) 06/19/2016  . Hyponatremia 06/19/2016  . Hypokalemia 06/19/2016  . BPH (benign prostatic hyperplasia) 06/19/2016  . Megacolon 05/07/2014  . Urinary retention 05/07/2014  . Intellectual disability 05/07/2014  . Hyperlipidemia 05/07/2014  . HTN (hypertension) 05/07/2014   PCP:  Pearson Grippe, MD Pharmacy:   [facility] [phone] - [place], [place] - [address] AT [facility] OF [address] [place] [address] Phone: [phone] Fax: [phone]  [facility] [place], [place] - [address] [place] [address] Phone: [phone] Fax: [phone]   Readmission Risk Interventions Readmission Risk Prevention Plan 05/26/2019 09/20/2018 09/19/2018  Transportation Screening Complete Complete Complete  PCP or Specialist Appt within 3-5 Days Not Complete - -  HRI or Home Care Consult Complete - -  Social Work Consult for Recovery Care Planning/Counseling Complete - -  Palliative Care Screening Not Complete - -  Medication Review (RN Care Manager) Complete Complete Complete  PCP or Specialist appointment within 3-5 days of discharge - Complete Complete  PCP/Specialist Appt Not Complete comments - - SNF  resident. Sees facility MD  HRI or Home Care Consult - Not Complete -  HRI or Home Care Consult Pt Refusal Comments - Patient is discharging to SNF for rehab -  SW Recovery Care/Counseling Consult - Complete -  Palliative Care Screening - Not Applicable -  Skilled Nursing Facility - Complete -  Some recent data might be hidden

## 2019-05-26 NOTE — Progress Notes (Signed)
[facility] Progress Note  1 Day Post-Op  Subjective: Doing well. Tolerating liquids. Having liquid Bms with rectiseal in place. Needs to stay regular. Distended now but is his baseline.   Objective: Vital signs in last 24 hours: Temp:  [96.6 F (35.9 C)-98.2 F (36.8 C)] 97.9 F (36.6 C) (02/09 0532) Pulse Rate:  [66-108] 91 (02/09 0532) Resp:  [13-28] 18 (02/09 0532) BP: (93-131)/(50-65) 104/50 (02/09 0532) SpO2:  [93 %-100 %] 100 % (02/09 0532) Last BM Date: 05/25/19  Intake/Output from previous day: 02/08 0701 - 02/09 0700 In: 3153 [P.O.:360; I.V.:2600.5; IV Piggyback:192.5] Out: 750 [Urine:600; Blood:150] Intake/Output this shift: Total I/O In: 360 [P.O.:360] Out: -   General appearance: alert, cooperative and no distress Resp: normal work of breathing GI: soft, distended, appropriately tender, no rebound or guarding Extremities: extremities normal, atraumatic, no cyanosis or edema  Lab Results:  Recent Labs    05/24/19 0616 05/26/19 0500  WBC 8.8 7.9  HGB 11.2* 9.9*  HCT 36.7* 33.9*  PLT 196 144*   BMET Recent Labs    05/24/19 0616 05/24/19 0616 05/24/19 2211 05/26/19 0500  NA 141  --   --  140  K 3.7  --   --  3.8  CL 103  --   --  105  CO2 28  --   --  27  GLUCOSE 126*   < > 208* 127*  BUN 26*  --   --  17  CREATININE 1.14  --   --  0.96  CALCIUM 8.9  --   --  8.1*   < > = values in this interval not displayed.    Anti-infectives: Anti-infectives (From admission, onward)   Start     Dose/Rate Route Frequency Ordered Stop   05/25/19 2200  piperacillin-tazobactam (ZOSYN) IVPB 3.375 g     3.375 g 12.5 mL/hr over 240 Minutes Intravenous Every 8 hours 05/25/19 1702 05/26/19 2159   05/25/19 0815  cefoTEtan (CEFOTAN) 2 g in sodium chloride 0.9 % 100 mL IVPB     2 g 200 mL/hr over 30 Minutes Intravenous On call to O.R. 05/25/19 0800 05/25/19 1446   05/23/19 0945  piperacillin-tazobactam (ZOSYN) IVPB 3.375 g  Status:  Discontinued      3.375 g 12.5 mL/hr over 240 Minutes Intravenous Every 8 hours 05/23/19 0935 05/25/19 1702      Assessment/Plan: [name] is POD 1 s/p Ex lap, open cholecystectomy for chronic cholecystitis and cholecystostomy tube in place and transient pneumatosis of the stomach and small intestine with healthy bowel on visualization. Doing well. PRN for pain, tylenol scheduled given his limited ability to communicate Added simethicone QID to help with distention  OOB today Soft diet now Zosyn can end today for the prior pneumatosis  Discussed with [name] and she wants to take him home if possible, would think tomorrow if patient is strong enough/ back to baseline to go home  Staples out in 2 weeks, will get my office to may appt   Updated Dr. Kerry Hough and Rn.    LOS: 3 days    [name] 05/26/2019

## 2019-05-27 DIAGNOSIS — E119 Type 2 diabetes mellitus without complications: Secondary | ICD-10-CM | POA: Diagnosis not present

## 2019-05-27 DIAGNOSIS — K811 Chronic cholecystitis: Secondary | ICD-10-CM | POA: Diagnosis not present

## 2019-05-27 DIAGNOSIS — K6389 Other specified diseases of intestine: Secondary | ICD-10-CM | POA: Diagnosis not present

## 2019-05-27 DIAGNOSIS — K8010 Calculus of gallbladder with chronic cholecystitis without obstruction: Secondary | ICD-10-CM | POA: Diagnosis not present

## 2019-05-27 DIAGNOSIS — R140 Abdominal distension (gaseous): Secondary | ICD-10-CM | POA: Diagnosis not present

## 2019-05-27 LAB — CBC
HCT: 28.5 % — ABNORMAL LOW (ref 39.0–52.0)
Hemoglobin: 8.6 g/dL — ABNORMAL LOW (ref 13.0–17.0)
MCH: 26.8 pg (ref 26.0–34.0)
MCHC: 30.2 g/dL (ref 30.0–36.0)
MCV: 88.8 fL (ref 80.0–100.0)
Platelets: 162 10*3/uL (ref 150–400)
RBC: 3.21 MIL/uL — ABNORMAL LOW (ref 4.22–5.81)
RDW: 17.2 % — ABNORMAL HIGH (ref 11.5–15.5)
WBC: 8 10*3/uL (ref 4.0–10.5)
nRBC: 0 % (ref 0.0–0.2)

## 2019-05-27 LAB — SURGICAL PATHOLOGY

## 2019-05-27 LAB — URINE CULTURE: Culture: NO GROWTH

## 2019-05-27 LAB — GLUCOSE, CAPILLARY
Glucose-Capillary: 106 mg/dL — ABNORMAL HIGH (ref 70–99)
Glucose-Capillary: 121 mg/dL — ABNORMAL HIGH (ref 70–99)
Glucose-Capillary: 127 mg/dL — ABNORMAL HIGH (ref 70–99)
Glucose-Capillary: 134 mg/dL — ABNORMAL HIGH (ref 70–99)
Glucose-Capillary: 146 mg/dL — ABNORMAL HIGH (ref 70–99)
Glucose-Capillary: 91 mg/dL (ref 70–99)
Glucose-Capillary: 92 mg/dL (ref 70–99)

## 2019-05-27 MED ORDER — ALUM & MAG HYDROXIDE-SIMETH 200-200-20 MG/5ML PO SUSP
30.0000 mL | Freq: Once | ORAL | Status: AC
  Administered 2019-05-27: 30 mL via ORAL
  Filled 2019-05-27: qty 30

## 2019-05-27 MED ORDER — SIMETHICONE 40 MG/0.6ML PO SUSP
80.0000 mg | Freq: Four times a day (QID) | ORAL | Status: DC
  Administered 2019-05-27 – 2019-05-29 (×8): 80 mg via ORAL
  Filled 2019-05-27: qty 1.2

## 2019-05-27 NOTE — Progress Notes (Deleted)
Patient's abd appear to be more distended this shift. Patient grimace when [facility] is elevated or lower. MD made aware via Amion.

## 2019-05-27 NOTE — Progress Notes (Addendum)
[facility] Progress Note  2 Days Post-Op  Subjective: Doing fair but had more distention overnight. Received roxicodone once for grimacing. Says he is not hurting now. Has mits in place. Rectiseal remains in place. Tolerating diet.  Objective: Vital signs in last 24 hours: Temp:  [98 F (36.7 C)-98.7 F (37.1 C)] 98.4 F (36.9 C) (02/10 0508) Pulse Rate:  [88-97] 88 (02/10 0508) Resp:  [16-20] 16 (02/10 0508) BP: (91-115)/(52-67) 91/52 (02/10 0508) SpO2:  [96 %-99 %] 99 % (02/10 0508) Last BM Date: 05/26/19  Intake/Output from previous day: 02/09 0701 - 02/10 0700 In: 840 [P.O.:840] Out: 450 [Urine:450] Intake/Output this shift: Total I/O In: 240 [P.O.:240] Out: -   General appearance: alert, cooperative and no distress Resp: normal work of breathing GI: soft, distended, slightly more than baseline, midline honeycomb with some staining of old blood, no erythema, cholecystostoy tube site with staples, no drainage or erythema  Lab Results:  Recent Labs    05/26/19 0500 05/27/19 0424  WBC 7.9 8.0  HGB 9.9* 8.6*  HCT 33.9* 28.5*  PLT 144* 162   BMET Recent Labs    05/24/19 2211 05/26/19 0500  NA  --  140  K  --  3.8  CL  --  105  CO2  --  27  GLUCOSE 208* 127*  BUN  --  17  CREATININE  --  0.96  CALCIUM  --  8.1*    Anti-infectives: Anti-infectives (From admission, onward)   Start     Dose/Rate Route Frequency Ordered Stop   05/25/19 2200  piperacillin-tazobactam (ZOSYN) IVPB 3.375 g     3.375 g 12.5 mL/hr over 240 Minutes Intravenous Every 8 hours 05/25/19 1702 05/26/19 1743   05/25/19 0815  cefoTEtan (CEFOTAN) 2 g in sodium chloride 0.9 % 100 mL IVPB     2 g 200 mL/hr over 30 Minutes Intravenous On call to O.R. 05/25/19 0800 05/25/19 1446   05/23/19 0945  piperacillin-tazobactam (ZOSYN) IVPB 3.375 g  Status:  Discontinued     3.375 g 12.5 mL/hr over 240 Minutes Intravenous Every 8 hours 05/23/19 0935 05/25/19 1702       Assessment/Plan: [name] is POD 2 s/p Ex lap, open cholecystectomy for chronic cholecystitis and cholecystostomy tube in place and transient pneumatosis of the stomach and small intestine with healthy bowel on visualization.  Ttylenol scheduled and PRN roxicodone for pain Simethicone increased to 80 mg from 40 mg QID OOB and work with PT to prevent further weakness and deconditioning Soft diet  Rectal tube out  H&H drifting down, did have intraoperative blood loss, will repeat CBC tomorrow, may need blood if symptomatic or continues to drift Discussed with [name] and she wants to take him home if possible but will be honest if she thinks he is not appropriate for her care. She is visiting daily and felt he looked well and better than she expected yesterday.  RN to get patient to commode every shift for him to evacuate air   Updated Dr. Marisa Severin and RN.    LOS: 4 days    [name] 05/27/2019

## 2019-05-27 NOTE — TOC Progression Note (Signed)
Transition of Care [facility]) - Progression Note    Patient Details  Name: BAIRON KLEMANN MRN: [id number] Date of Birth: 1950-06-11  Transition of Care [facility]) CM/SW Contact  Shade Flood, LCSW Phone Number: 05/27/2019, 1:50 PM  Clinical Narrative:     Met with pt and his aunt today to discuss PT recommendations. Pt's aunt states that at this time, she continues to want to take pt home with HH at dc. She will let TOC know if she changes her mind.  Expected Discharge Plan: [place] Barriers to Discharge: Continued Medical Work up  Expected Discharge Plan and Services Expected Discharge Plan: [place] Choice: [place] arrangements for the past 2 months: Mobile Home                           HH Arranged: PT, RN [facility] Agency: [place] ([facility]) Date [place]: 05/26/19 Time [place]: 1120 Representative spoke with at [place]: [place] (SDOH) Interventions    Readmission Risk Interventions Readmission Risk Prevention Plan 05/26/2019 09/20/2018 09/19/2018  Transportation Screening Complete Complete Complete  PCP or Specialist Appt within 3-5 Days Not Complete - -  HRI or Home Care Consult Complete - -  Social Work Consult for [place] Planning/Counseling Complete - -  Palliative Care Screening Not Complete - -  Medication Review Press photographer) Complete Complete Complete  PCP or Specialist appointment within 3-5 days of discharge - Complete Complete  PCP/Specialist Appt Not Complete comments - - SNF resident. Sees facility MD  [place] or Home Care Consult - Not Complete -  [place] or Home Care Consult Pt Refusal Comments - Patient is discharging to SNF for rehab -  SW Recovery Care/Counseling Consult - Complete -  Palliative Care Screening - Not Applicable -  [place] - Complete -  Some recent data might be hidden

## 2019-05-27 NOTE — Plan of Care (Signed)
  Problem: Acute Rehab PT Goals(only PT should resolve) Goal: Pt Will Go Supine/Side To Sit Outcome: Progressing Flowsheets (Taken 05/27/2019 1446) Pt will go Supine/Side to Sit: with min guard assist Goal: Patient Will Transfer Sit To/From Stand Outcome: Progressing Flowsheets (Taken 05/27/2019 1446) Patient will transfer sit to/from stand:  with min guard assist  with minimal assist Goal: Pt Will Transfer Bed To Chair/Chair To Bed Outcome: Progressing Flowsheets (Taken 05/27/2019 1446) Pt will Transfer Bed to Chair/Chair to Bed:  min guard assist  with min assist Goal: Pt Will Ambulate Outcome: Progressing Flowsheets (Taken 05/27/2019 1446) Pt will Ambulate:  75 feet  with minimal assist  with rolling walker   2:46 PM, 05/27/19 Ocie Bob, MPT Physical Therapist with [facility] [phone] office [phone] mobile phone

## 2019-05-27 NOTE — Progress Notes (Signed)
PROGRESS NOTE    [name]  DJS:[id number] DOB: Aug 17, 1950 DOA: 05/23/2019 PCP: Pearson Grippe, MD    Brief Narrative:  HPI per Dr. Hedwig Morton  is a 69 y.o. male, with history of mental retardation, urinary retention, volvulus, had expiratory laparotomy with reduction of volvulus on 08/26/2018, patient have chronic abdominal distention due to intestinal dysmotility, s/p cholecystostomy tube for cholecystitis in January 2020, recently cholecystostomy drain was replaced by surgery, today was brought to the ED after abdominal distention became worse.  Also multiple episodes of dry heaving. In the ED CT scan of the abdomen and pelvis showed moderate to marked distention of the stomach and small bowel with pneumatosis within the stomach wall and within the  small bowel loop walls.  NG tube placed in the ED.  General surgery consulted. Denies chest pain or shortness of breath. Complains of abdominal pain   Assessment & Plan:   Active Problems:   Diabetes mellitus type 2, noninsulin dependent (HCC)   BPH (benign prostatic hyperplasia)   Abdominal distension   Pneumatosis intestinalis   Chronic cholecystitis   1)Abdominal Distention-- ileus Versus small bowel obstruction.  Noted to have pneumatosis in stomach.  Seen by general surgery and underwent operative management on 05/25/19.  was iv antibiotics--- Further post op care per General surgery --Remains distended, did have small BM over the last 24 hours  2)Chronic cholecystitis/cholelithiasis.  s/p cholecystectomy on 05/25/19.  General surgery following.  3)BPH--  Patient has history of urinary retention and does in and out cath 3 times a day.  restart  Flomax/finasteride    4)Diabetes.  Holding Metformin.  Continue sliding scale insulin.   5)Functional status-   Patient's family reports that at baseline, he ambulates with the assistance of a walker.   PT eval appreciated, recommends SNF rehab  6)FEN/hypokalemia --- oral intake is  not at goal, remains challenging, replace electrolytes and encourage increased oral intake  7)Disposition--patient's aunt/caregiver declines SNF rehab, she is considering discharge home with home health services if tolerating oral intake well and if bowel function improves, pending approval and clearance from general surgery team  DVT prophylaxis: Lovenox Code Status: Full code Family Communication:  Discussed with [name] at bedside   Consultants:   General surgery  Procedures:   Lap chole  Antimicrobials:   Zosyn 2/6 >   Subjective: -Oral intake remains challenging, -Abdomen remains distended, patient with nausea, had only small BM  Objective: Vitals:   05/26/19 2005 05/27/19 0508 05/27/19 1458 05/27/19 1721  BP: 115/66 (!) 91/52 (!) 118/58 103/74  Pulse: 97 88 91 88  Resp: 20 16 18 18   Temp: 98.7 F (37.1 C) 98.4 F (36.9 C) 97.9 F (36.6 C) 98.1 F (36.7 C)  TempSrc: Oral Oral Oral Oral  SpO2: 96% 99% 99% 97%  Weight:      Height:        Intake/Output Summary (Last 24 hours) at 05/27/2019 2000 Last data filed at 05/27/2019 1723 Gross per 24 hour  Intake 720 ml  Output 360 ml  Net 360 ml   Filed Weights   05/23/19 0222 05/25/19 0501  Weight: 67.5 kg 66.8 kg    Examination: General exam: Alert, awake, no distress Respiratory system: Clear to auscultation. Respiratory effort normal. Cardiovascular system:RRR. No murmurs, rubs, gallops. Gastrointestinal system: Abdomen is quite distended, soft and nontender.  Diminished bowel sounds , postop wound intact/healing Central nervous system: Generalized weakness, no New focal neurological deficits. Extremities: No C/C/E, +pedal pulses Skin: No rashes,  lesions or ulcers Psychiatry: Baseline cognitive and memory deficits,    Data Reviewed: I have personally reviewed following labs and imaging studies  CBC: Recent Labs  Lab 05/23/19 0311 05/24/19 0616 05/26/19 0500 05/27/19 0424  WBC 9.4 8.8 7.9 8.0   NEUTROABS 8.4*  --  6.4  --   HGB 13.6 11.2* 9.9* 8.6*  HCT 45.1 36.7* 33.9* 28.5*  MCV 86.7 86.4 89.2 88.8  PLT 243 196 144* 384   Basic Metabolic Panel: Recent Labs  Lab 05/23/19 0311 05/24/19 0616 05/24/19 2211 05/26/19 0500  NA 136 141  --  140  K 4.2 3.7  --  3.8  CL 99 103  --  105  CO2 26 28  --  27  GLUCOSE 226* 126* 208* 127*  BUN 24* 26*  --  17  CREATININE 1.22 1.14  --  0.96  CALCIUM 10.2 8.9  --  8.1*   GFR: Estimated Creatinine Clearance: 67.9 mL/min (by C-G formula based on SCr of 0.96 mg/dL). Liver Function Tests: Recent Labs  Lab 05/23/19 0311 05/24/19 0616 05/26/19 0500  AST 18 11* 44*  ALT 14 9 32  ALKPHOS 96 65 46  BILITOT 0.5 0.8 0.7  PROT 9.5* 6.8 5.9*  ALBUMIN 4.8 3.1* 2.7*   No results for input(s): LIPASE, AMYLASE in the last 168 hours. No results for input(s): AMMONIA in the last 168 hours. Coagulation Profile: No results for input(s): INR, PROTIME in the last 168 hours. Cardiac Enzymes: No results for input(s): CKTOTAL, CKMB, CKMBINDEX, TROPONINI in the last 168 hours. BNP (last 3 results) No results for input(s): PROBNP in the last 8760 hours. HbA1C: No results for input(s): HGBA1C in the last 72 hours. CBG: Recent Labs  Lab 05/27/19 0506 05/27/19 0734 05/27/19 1122 05/27/19 1643 05/27/19 1948  GLUCAP 92 121* 146* 127* 134*   Lipid Profile: No results for input(s): CHOL, HDL, LDLCALC, TRIG, CHOLHDL, LDLDIRECT in the last 72 hours. Thyroid Function Tests: No results for input(s): TSH, T4TOTAL, FREET4, T3FREE, THYROIDAB in the last 72 hours. Anemia Panel: No results for input(s): VITAMINB12, FOLATE, FERRITIN, TIBC, IRON, RETICCTPCT in the last 72 hours. Sepsis Labs: No results for input(s): PROCALCITON, LATICACIDVEN in the last 168 hours.  Recent Results (from the past 240 hour(s))  SARS CORONAVIRUS 2 (TAT 6-24 HRS) Nasopharyngeal Nasopharyngeal Swab     Status: None   Collection Time: 05/24/19  7:38 PM   Specimen:  Nasopharyngeal Swab  Result Value Ref Range Status   SARS Coronavirus 2 NEGATIVE NEGATIVE Final    Comment: (NOTE) SARS-CoV-2 target nucleic acids are NOT DETECTED. The SARS-CoV-2 RNA is generally detectable in upper and lower respiratory specimens during the acute phase of infection. Negative results do not preclude SARS-CoV-2 infection, do not rule out co-infections with other pathogens, and should not be used as the sole basis for treatment or other patient management decisions. Negative results must be combined with clinical observations, patient history, and epidemiological information. The expected result is Negative. Fact Sheet for Patients: SugarRoll.be Fact Sheet for Healthcare Providers: https://www.woods-mathews.com/ This test is not yet approved or cleared by the [place] FDA and  has been authorized for detection and/or diagnosis of SARS-CoV-2 by FDA under an Emergency Use Authorization (EUA). This EUA will remain  in effect (meaning this test can be used) for the duration of the COVID-19 declaration under Section 56 4(b)(1) of the Act, 21 U.S.C. section 360bbb-3(b)(1), unless the authorization is terminated or revoked sooner. Performed at [name],  [address] [place], [place] [address]   Surgical PCR screen     Status: None   Collection Time: 05/24/19  8:48 PM   Specimen: Nasopharyngeal Swab; Nasal Swab  Result Value Ref Range Status   MRSA, PCR NEGATIVE NEGATIVE Final   Staphylococcus aureus NEGATIVE NEGATIVE Final    Comment: (NOTE) The Xpert SA Assay (FDA approved for NASAL specimens in patients 74 years of age and older), is one component of a comprehensive surveillance program. It is not intended to diagnose infection nor to guide or monitor treatment. Performed at [facility], [address], [place], [place] [address]   Respiratory Panel by RT PCR (Flu A&B, Covid) - Nasopharyngeal Swab     Status:  None   Collection Time: 05/25/19 10:23 AM   Specimen: Nasopharyngeal Swab  Result Value Ref Range Status   SARS Coronavirus 2 by RT PCR NEGATIVE NEGATIVE Final    Comment: (NOTE) SARS-CoV-2 target nucleic acids are NOT DETECTED. The SARS-CoV-2 RNA is generally detectable in upper respiratoy specimens during the acute phase of infection. The lowest concentration of SARS-CoV-2 viral copies this assay can detect is 131 copies/mL. A negative result does not preclude SARS-Cov-2 infection and should not be used as the sole basis for treatment or other patient management decisions. A negative result may occur with  improper specimen collection/handling, submission of specimen other than nasopharyngeal swab, presence of viral mutation(s) within the areas targeted by this assay, and inadequate number of viral copies (<131 copies/mL). A negative result must be combined with clinical observations, patient history, and epidemiological information. The expected result is Negative. Fact Sheet for Patients:  https://www.moore.com/ Fact Sheet for Healthcare Providers:  https://www.young.biz/ This test is not yet ap proved or cleared by the [place] FDA and  has been authorized for detection and/or diagnosis of SARS-CoV-2 by FDA under an Emergency Use Authorization (EUA). This EUA will remain  in effect (meaning this test can be used) for the duration of the COVID-19 declaration under Section 564(b)(1) of the Act, 21 U.S.C. section 360bbb-3(b)(1), unless the authorization is terminated or revoked sooner.    Influenza A by PCR NEGATIVE NEGATIVE Final   Influenza B by PCR NEGATIVE NEGATIVE Final    Comment: (NOTE) The Xpert Xpress SARS-CoV-2/FLU/RSV assay is intended as an aid in  the diagnosis of influenza from Nasopharyngeal swab specimens and  should not be used as a sole basis for treatment. Nasal washings and  aspirates are unacceptable for Xpert  Xpress SARS-CoV-2/FLU/RSV  testing. Fact Sheet for Patients: https://www.moore.com/ Fact Sheet for Healthcare Providers: https://www.young.biz/ This test is not yet approved or cleared by the [place] FDA and  has been authorized for detection and/or diagnosis of SARS-CoV-2 by  FDA under an Emergency Use Authorization (EUA). This EUA will remain  in effect (meaning this test can be used) for the duration of the  Covid-19 declaration under Section 564(b)(1) of the Act, 21  U.S.C. section 360bbb-3(b)(1), unless the authorization is  terminated or revoked. Performed at [facility], [address], [place], [place] [address]   Urine Culture     Status: None   Collection Time: 05/25/19  3:17 PM   Specimen: Urine, Catheterized  Result Value Ref Range Status   Specimen Description   Final    URINE, CATHETERIZED Performed at [facility], [address], [place], [place] [address]    Special Requests   Final    NONE Performed at [facility], [address], [place], [place]  [address]    Culture   Final    NO GROWTH Performed at [facility], [address] [place], [place] [address]    Report Status 05/27/2019 FINAL  Final         Radiology Studies: No results found.      Scheduled Meds: . acetaminophen  650 mg Oral Q6H  . docusate sodium  100 mg Oral BID  . enoxaparin (LOVENOX) injection  40 mg Subcutaneous Q24H  . insulin aspart  0-9 Units Subcutaneous Q4H  . simethicone  80 mg Oral QID   Continuous Infusions: . lactated ringers 100 mL/hr at 05/27/19 1723     LOS: 4 days      Shon Hale, MD [facility]   If 7PM-7AM, please contact night-coverage [email or web address]  05/27/2019, 8:00 PM

## 2019-05-27 NOTE — Evaluation (Signed)
Physical Therapy Evaluation Patient Details Name: [name] MRN: [id number] DOB: Feb 28, 1951 Today's Date: 05/27/2019   History of Present Illness  [name]  is a 69 y.o. male s/p Exploratory laparotomy, running of bowel, open cholecystectomy on 05/25/19, with history of mental retardation, urinary retention, volvulus, had expiratory laparotomy with reduction of volvulus on 08/26/2018, patient have chronic abdominal distention due to intestinal dysmotility, s/p cholecystostomy tube for cholecystitis in January 2020, recently cholecystostomy drain was replaced by surgery, today was brought to the ED after abdominal distention became worse.  Also multiple episodes of dry heaving.In the ED CT scan of the abdomen and pelvis showed moderate to marked distention of the stomach and small bowel with pneumatosis within the stomach wall and within the  small bowel loop walls.  NG tube placed in the ED.  General surgery consulted.Denies chest pain or shortness of breath.Complains of abdominal pain    Clinical Impression  Patient demonstrates labored movement for rolling to side and sitting up at from side lying position with use of bed rail, at risk for falls during gait training secondary to pushing RW to far in front due to flexed trunk requiring constant verbal/tactile cueing to step closer to RW with poor carryover demonstrated.  Patient tolerated sitting up in chair after therapy - RN notified.  Patient will benefit from continued physical therapy in hospital and recommended venue below to increase strength, balance, endurance for safe ADLs and gait.     Follow Up Recommendations SNF;Supervision for mobility/OOB;Supervision - Intermittent    Equipment Recommendations  None recommended by PT    Recommendations for Other Services       Precautions / Restrictions Precautions Precautions: Fall Restrictions Weight Bearing Restrictions: No      Mobility  Bed Mobility Overal bed mobility:  Needs Assistance Bed Mobility: Sidelying to Sit;Rolling Rolling: Min assist Sidelying to sit: Min assist       General bed mobility comments: slow labored movement  Transfers Overall transfer level: Needs assistance Equipment used: Rolling walker (2 wheeled) Transfers: Sit to/from [employer] Sit to Stand: Min assist Stand pivot transfers: Min assist       General transfer comment: unsteady on feet, increased time with labored movment  Ambulation/Gait Ambulation/Gait assistance: Min assist;Mod assist Gait Distance (Feet): 30 Feet Assistive device: Rolling walker (2 wheeled) Gait Pattern/deviations: Decreased step length - right;Decreased step length - left;Decreased stride length;Trunk flexed Gait velocity: decreased   General Gait Details: tends to push RW to far in front due to flexed trunk requiring frequent verbal/tactile cueing to avoid loss of balance, limited secondary to fatigue  Stairs            Wheelchair Mobility    Modified Rankin (Stroke Patients Only)       Balance                                             Pertinent Vitals/Pain Pain Assessment: Faces Faces Pain Scale: Hurts a little bit Pain Location: abdomen at site of surgery Pain Descriptors / Indicators: Discomfort;Grimacing Pain Intervention(s): Limited activity within patient's tolerance;Monitored during session;Repositioned    Home Living Family/patient expects to be discharged to:: Private residence   Available Help at Discharge: Family Type of Home: Mobile home Home Access: Stairs to enter Entrance Stairs-Rails: Right;Left;Can reach both Entrance Stairs-Number of Steps: 3-4 Home Layout: One level Home Equipment:  Walker - 2 wheels;Wheelchair - manual      Prior Function Level of Independence: Needs assistance;Independent with assistive device(s)   Gait / Transfers Assistance Needed: household and short distanced community ambulator with  RW  ADL's / Homemaking Assistance Needed: assisted by family        Hand Dominance        Extremity/Trunk Assessment   Upper Extremity Assessment Upper Extremity Assessment: Generalized weakness    Lower Extremity Assessment Lower Extremity Assessment: Generalized weakness    Cervical / Trunk Assessment Cervical / Trunk Assessment: Kyphotic  Communication   Communication: Other (comment)(Patient mostly non-verbal)  Cognition Arousal/Alertness: Awake/alert Behavior During Therapy: WFL for tasks assessed/performed Overall Cognitive Status: History of cognitive impairments - at baseline                                        General Comments      Exercises     Assessment/Plan    PT Assessment Patient needs continued PT services  PT Problem List Decreased strength;Decreased activity tolerance;Decreased balance;Decreased mobility       PT Treatment Interventions Gait training;Stair training;Functional mobility training;Therapeutic activities;Therapeutic exercise;Patient/family education    PT Goals (Current goals can be found in the Care Plan section)  Acute Rehab PT Goals Patient Stated Goal: none stated PT Goal Formulation: With patient Time For Goal Achievement: 06/10/19 Potential to Achieve Goals: Good    Frequency Min 4X/week   Barriers to discharge        Co-evaluation               AM-PAC PT "6 Clicks" Mobility  Outcome Measure Help needed turning from your back to your side while in a flat bed without using bedrails?: A Little Help needed moving from lying on your back to sitting on the side of a flat bed without using bedrails?: A Little Help needed moving to and from a bed to a chair (including a wheelchair)?: A Little Help needed standing up from a chair using your arms (e.g., wheelchair or bedside chair)?: A Little Help needed to walk in hospital room?: A Lot Help needed climbing 3-5 steps with a railing? : A Lot 6  Click Score: 16    End of Session   Activity Tolerance: Patient tolerated treatment well;Patient limited by fatigue Patient left: in chair;with call bell/phone within reach;with chair alarm set Nurse Communication: Mobility status PT Visit Diagnosis: Other abnormalities of gait and mobility (R26.89);Unsteadiness on feet (R26.81);Muscle weakness (generalized) (M62.81)    Time: 4315-4008 PT Time Calculation (min) (ACUTE ONLY): 24 min   Charges:   PT Evaluation $PT Eval Moderate Complexity: 1 Mod PT Treatments $Therapeutic Activity: 23-37 mins        2:44 PM, 05/27/19 Lonell Grandchild, MPT Physical Therapist with [facility] [phone] office [phone] mobile phone

## 2019-05-27 NOTE — Progress Notes (Signed)
Patient's abd appear to be more distended this shift. Patient grimace when HOB is elevated or lower. MD made aware via Amion. 

## 2019-05-27 NOTE — Plan of Care (Signed)
  Problem: Education: Goal: Knowledge of General Education information will improve Description: Including pain rating scale, medication(s)/side effects and non-pharmacologic comfort measures Outcome: Progressing   Problem: Clinical Measurements: Goal: Ability to maintain clinical measurements within normal limits will improve Outcome: Progressing Goal: Will remain free from infection Outcome: Progressing   

## 2019-05-27 NOTE — Progress Notes (Signed)
Removed flexiseal per Dr. Henreitta Leber order

## 2019-05-28 DIAGNOSIS — R140 Abdominal distension (gaseous): Secondary | ICD-10-CM | POA: Diagnosis not present

## 2019-05-28 DIAGNOSIS — K8010 Calculus of gallbladder with chronic cholecystitis without obstruction: Secondary | ICD-10-CM | POA: Diagnosis not present

## 2019-05-28 DIAGNOSIS — E119 Type 2 diabetes mellitus without complications: Secondary | ICD-10-CM | POA: Diagnosis not present

## 2019-05-28 DIAGNOSIS — K6389 Other specified diseases of intestine: Secondary | ICD-10-CM | POA: Diagnosis not present

## 2019-05-28 DIAGNOSIS — K811 Chronic cholecystitis: Secondary | ICD-10-CM | POA: Diagnosis not present

## 2019-05-28 LAB — GLUCOSE, CAPILLARY
Glucose-Capillary: 98 mg/dL (ref 70–99)
Glucose-Capillary: 97 mg/dL (ref 70–99)
Glucose-Capillary: 108 mg/dL — ABNORMAL HIGH (ref 70–99)
Glucose-Capillary: 138 mg/dL — ABNORMAL HIGH (ref 70–99)

## 2019-05-28 LAB — CBC
HCT: 28.6 % — ABNORMAL LOW (ref 39.0–52.0)
Hemoglobin: 8.4 g/dL — ABNORMAL LOW (ref 13.0–17.0)
MCH: 26.2 pg (ref 26.0–34.0)
MCHC: 29.4 g/dL — ABNORMAL LOW (ref 30.0–36.0)
MCV: 89.1 fL (ref 80.0–100.0)
Platelets: 180 10*3/uL (ref 150–400)
RBC: 3.21 MIL/uL — ABNORMAL LOW (ref 4.22–5.81)
RDW: 17.1 % — ABNORMAL HIGH (ref 11.5–15.5)
WBC: 7.6 10*3/uL (ref 4.0–10.5)
nRBC: 0 % (ref 0.0–0.2)

## 2019-05-28 LAB — BASIC METABOLIC PANEL
Anion gap: 8 (ref 5–15)
BUN: 11 mg/dL (ref 8–23)
CO2: 29 mmol/L (ref 22–32)
Calcium: 8.2 mg/dL — ABNORMAL LOW (ref 8.9–10.3)
Chloride: 101 mmol/L (ref 98–111)
Creatinine, Ser: 0.75 mg/dL (ref 0.61–1.24)
GFR calc Af Amer: >60 mL/min (ref >60)
GFR calc non Af Amer: >60 mL/min (ref >60)
Glucose, Bld: 92 mg/dL (ref 70–99)
Potassium: 3.2 mmol/L — ABNORMAL LOW (ref 3.5–5.1)
Sodium: 138 mmol/L (ref 135–145)

## 2019-05-28 MED ORDER — TAMSULOSIN HCL 0.4 MG PO CAPS
0.4000 mg | ORAL_CAPSULE | Freq: Every day | ORAL | Status: DC
  Administered 2019-05-28: 0.4 mg via ORAL
  Filled 2019-05-28: qty 1

## 2019-05-28 MED ORDER — BISACODYL 10 MG RE SUPP: 10.0000 mg | Freq: Once | RECTAL | Status: DC

## 2019-05-28 MED ORDER — SENNOSIDES-DOCUSATE SODIUM 8.6-50 MG PO TABS
2.0000 | ORAL_TABLET | Freq: Every day | ORAL | Status: DC
  Administered 2019-05-28: 2 via ORAL
  Filled 2019-05-28: qty 2

## 2019-05-28 MED ORDER — POTASSIUM CHLORIDE CRYS ER 20 MEQ PO TBCR
40.0000 meq | EXTENDED_RELEASE_TABLET | ORAL | Status: AC
  Administered 2019-05-28 (×2): 40 meq via ORAL
  Filled 2019-05-28 (×2): qty 2

## 2019-05-28 MED ORDER — POTASSIUM CHLORIDE CRYS ER 20 MEQ PO TBCR
40.0000 meq | EXTENDED_RELEASE_TABLET | ORAL | Status: AC
  Administered 2019-05-28 (×2): 40 meq via ORAL
  Filled 2019-05-28: qty 2

## 2019-05-28 MED ORDER — FINASTERIDE 5 MG PO TABS
5.0000 mg | ORAL_TABLET | Freq: Every day | ORAL | Status: DC
  Administered 2019-05-28 – 2019-05-29 (×2): 5 mg via ORAL
  Filled 2019-05-28 (×2): qty 1

## 2019-05-28 NOTE — Progress Notes (Addendum)
Physical Therapy Treatment Patient Details Name: [name] MRN: [id number] DOB: 10-31-1950 Today's Date: 05/28/2019    History of Present Illness [name]  is a 69 y.o. male s/p Exploratory laparotomy, running of bowel, open cholecystectomy on 05/25/19, with history of mental retardation, urinary retention, volvulus, had expiratory laparotomy with reduction of volvulus on 08/26/2018, patient have chronic abdominal distention due to intestinal dysmotility, s/p cholecystostomy tube for cholecystitis in January 2020, recently cholecystostomy drain was replaced by surgery, today was brought to the ED after abdominal distention became worse.  Also multiple episodes of dry heaving.In the ED CT scan of the abdomen and pelvis showed moderate to marked distention of the stomach and small bowel with pneumatosis within the stomach wall and within the  small bowel loop walls.  NG tube placed in the ED.  General surgery consulted.Denies chest pain or shortness of breath.Complains of abdominal pain    PT Comments    Patient has difficulty sitting up from side lying position due to increased abdominal pain at surgical site, demonstrates slight improvement for stepping closer to RW during ambulation, but once fatigued he flexes trunk even more pushing RW to far in front.  Patient tolerated sitting up in chair with his family member present after therapy.  Patient will benefit from continued physical therapy in hospital and recommended venue below to increase strength, balance, endurance for safe ADLs and gait.  Patient's Aunt given gait belt and written instructions for HEP to use/instruct patient when he returns home.   Follow Up Recommendations  SNF;Supervision for mobility/OOB;Supervision - Intermittent     Equipment Recommendations  None recommended by PT    Recommendations for Other Services       Precautions / Restrictions Precautions Precautions: Fall Restrictions Weight Bearing  Restrictions: No    Mobility  Bed Mobility Overal bed mobility: Needs Assistance Bed Mobility: Rolling;Sidelying to Sit Rolling: Min guard Sidelying to sit: Min assist       General bed mobility comments: slow labored movement  Transfers Overall transfer level: Needs assistance Equipment used: Rolling walker (2 wheeled) Transfers: Sit to/from [facility] Sit to Stand: Min assist Stand pivot transfers: Min assist       General transfer comment: increased time, labored movement  Ambulation/Gait Ambulation/Gait assistance: Min assist;Mod assist Gait Distance (Feet): 28 Feet Assistive device: Rolling walker (2 wheeled) Gait Pattern/deviations: Decreased step length - right;Decreased step length - left;Decreased stride length Gait velocity: decreased   General Gait Details: slight improvement for stepping closer to RW during ambulation, but once fatigued he continues to push RW to far in front requiring verbal/tactile cueing for safety   Stairs             Wheelchair Mobility    Modified Rankin (Stroke Patients Only)       Balance Overall balance assessment: Needs assistance Sitting-balance support: Feet supported;No upper extremity supported Sitting balance-[name]: Fair Sitting balance - Comments: fair/good seated at bedside   Standing balance support: During functional activity;Bilateral upper extremity supported Standing balance-[name]: Fair Standing balance comment: using RW                            Cognition Arousal/Alertness: Awake/alert Behavior During Therapy: WFL for tasks assessed/performed Overall Cognitive Status: History of cognitive impairments - at baseline  Exercises General Exercises - Lower Extremity Ankle Circles/Pumps: Seated;AROM;Strengthening;Both;5 reps Long Arc Quad: Seated;AROM;Strengthening;Both;5 reps Hip Flexion/Marching:  Seated;AROM;Strengthening;Both;5 reps    General Comments        Pertinent Vitals/Pain Pain Assessment: Faces Faces Pain Scale: Hurts even more Pain Location: abdomen at site of surgery during sidelying to sitting Pain Descriptors / Indicators: Discomfort;Grimacing;Guarding Pain Intervention(s): Limited activity within patient's tolerance;Monitored during session;Repositioned    Home Living                      Prior Function            PT Goals (current goals can now be found in the care plan section) Acute Rehab PT Goals Patient Stated Goal: return home with family to assist PT Goal Formulation: With patient/family Time For Goal Achievement: 06/10/19 Potential to Achieve Goals: Good Progress towards PT goals: Progressing toward goals    Frequency    Min 4X/week      PT Plan Current plan remains appropriate    Co-evaluation              AM-PAC PT "6 Clicks" Mobility   Outcome Measure  Help needed turning from your back to your side while in a flat bed without using bedrails?: A Little Help needed moving from lying on your back to sitting on the side of a flat bed without using bedrails?: A Lot Help needed moving to and from a bed to a chair (including a wheelchair)?: A Little Help needed standing up from a chair using your arms (e.g., wheelchair or bedside chair)?: A Little Help needed to walk in hospital room?: A Lot Help needed climbing 3-5 steps with a railing? : A Lot 6 Click Score: 15    End of Session Equipment Utilized During Treatment: Gait belt Activity Tolerance: Patient tolerated treatment well Patient left: in chair;with call bell/phone within reach;with chair alarm set;with family/visitor present Nurse Communication: Mobility status PT Visit Diagnosis: Other abnormalities of gait and mobility (R26.89);Unsteadiness on feet (R26.81);Muscle weakness (generalized) (M62.81)     Time: 7893-8101 PT Time Calculation (min) (ACUTE ONLY):  27 min  Charges:  $Gait Training: 8-22 mins $Therapeutic Exercise: 8-22 mins                     4:38 PM, 05/28/19 Ocie Bob, MPT Physical Therapist with [facility] [phone] office [phone] mobile phone

## 2019-05-28 NOTE — Progress Notes (Signed)
[facility] Progress Note  3 Days Post-Op  Subjective: Patient acknowledges some abdominal pain. Received roxicodone overnight. Patient has been voiding with condom catheter, currently removed. Rectal tube removed. He is passing bowel movements and flatus.Tolerating diet.  Objective: Vital signs in last 24 hours: Temp:  [97.8 F (36.6 C)-98.2 F (36.8 C)] 97.8 F (36.6 C) (02/11 0411) Pulse Rate:  [80-91] 89 (02/11 0411) Resp:  [17-18] 18 (02/11 0411) BP: (102-118)/(48-74) 110/49 (02/11 0411) SpO2:  [89 %-100 %] 96 % (02/11 0411) Last BM Date: 05/26/19  Intake/Output from previous day: 02/10 0701 - 02/11 0700 In: 720 [P.O.:720] Out: 1260 [Urine:1250; Stool:10] Intake/Output this shift: No intake/output data recorded.  General appearance: alert, cooperative and no distress Resp: normal work of breathing GI: soft, distended, slightly more than baseline, tympanic to percussion, midline honeycomb with some staining of old blood, no erythema, cholecystostoy tube site with staples, no drainage or erythema  Lab Results:  Recent Labs    05/27/19 0424 05/28/19 0407  WBC 8.0 7.6  HGB 8.6* 8.4*  HCT 28.5* 28.6*  PLT 162 180   BMET Recent Labs    05/26/19 0500 05/28/19 0407  NA 140 138  K 3.8 3.2*  CL 105 101  CO2 27 29  GLUCOSE 127* 92  BUN 17 11  CREATININE 0.96 0.75  CALCIUM 8.1* 8.2*    Anti-infectives: Anti-infectives (From admission, onward)   Start     Dose/Rate Route Frequency Ordered Stop   05/25/19 2200  piperacillin-tazobactam (ZOSYN) IVPB 3.375 g     3.375 g 12.5 mL/hr over 240 Minutes Intravenous Every 8 hours 05/25/19 1702 05/26/19 1743   05/25/19 0815  cefoTEtan (CEFOTAN) 2 g in sodium chloride 0.9 % 100 mL IVPB     2 g 200 mL/hr over 30 Minutes Intravenous On call to O.R. 05/25/19 0800 05/25/19 1446   05/23/19 0945  piperacillin-tazobactam (ZOSYN) IVPB 3.375 g  Status:  Discontinued     3.375 g 12.5 mL/hr over 240 Minutes  Intravenous Every 8 hours 05/23/19 0935 05/25/19 1702      Assessment/Plan: Mr. Gover is POD 3 s/p Ex lap, open cholecystectomy for chronic cholecystitis and cholecystostomy tube in place and transient pneumatosis of the stomach and small intestine with healthy bowel on visualization.  Tylenol scheduled and PRN roxicodone for pain Simethicone increased to 80 mg from 40 mg QID OOB and work with PT to prevent further weakness and deconditioning Soft diet  Rectal tube out  H&H drifting down, did have intraoperative blood loss, repeat CBC stable at 8.4 from 8.6 and down from 11.2 prior to surgery Discussed with [name] and she wants to take him home if possible but will be honest if she thinks he is not appropriate for her care. She is visiting daily and felt he looked well and better than she expected yesterday. He would likely benefit from having home PT.  RN to get patient to commode every shift for him to evacuate air      LOS: 5 days    Micheline Maze 05/28/2019

## 2019-05-28 NOTE — Progress Notes (Signed)
PROGRESS NOTE    [name]  MGQ:[id number] DOB: 03-28-1951 DOA: 05/23/2019 PCP: Pearson Grippe, MD    Brief Narrative:  HPI per Dr. Hedwig Morton  is a 69 y.o. male, with history of mental retardation, urinary retention, volvulus, had expiratory laparotomy with reduction of volvulus on 08/26/2018, patient have chronic abdominal distention due to intestinal dysmotility, s/p cholecystostomy tube for cholecystitis in January 2020, recently cholecystostomy drain was replaced by surgery, today was brought to the ED after abdominal distention became worse.  Also multiple episodes of dry heaving. In the ED CT scan of the abdomen and pelvis showed moderate to marked distention of the stomach and small bowel with pneumatosis within the stomach wall and within the  small bowel loop walls.  NG tube placed in the ED.  General surgery consulted. Denies chest pain or shortness of breath. Complains of abdominal pain   Assessment & Plan:   Active Problems:   Diabetes mellitus type 2, noninsulin dependent (HCC)   BPH (benign prostatic hyperplasia)   Abdominal distension   Pneumatosis intestinalis   Chronic cholecystitis   1)Abdominal Distention-- ileus Versus small bowel obstruction.  Noted to have pneumatosis in stomach.  Seen by general surgery and underwent operative management on 05/25/19.  - Completed IV Zosyn --- - Further post op care per General surgery --Remains distended, did have small BM over the last 24 hours -General surgery would like to increase his simethicone to 80 mg from 40 mg 4 times daily  2)Chronic cholecystitis/cholelithiasis.  s/p cholecystectomy on 05/25/19.  General surgery following. -Completed IV Zosyn  3)BPH--  Patient has history of urinary retention and does in and out cath 3 times a day.  restarted  Flomax/finasteride   -Actually able to void spontaneously at times  4)Diabetes.  Holding Metformin.  Use Novolog/Humalog Sliding scale insulin with  Accu-Cheks/Fingersticks as ordered   5)Functional status/Disposition-   Patient's family reports that at baseline, he ambulates with the assistance of a walker.   PT eval appreciated, recommends SNF rehab, patient's aunt/caregiver declines SNF rehab, she is considering discharge home with home health services if tolerating oral intake well and if bowel function improves, pending approval and clearance from general surgery team -Possible discharge home with home health on 05/29/2019 if bowel function continues to improve  6)FEN/hypokalemia --- oral intake is not at goal, remains challenging, replace electrolytes and encourage increased oral intake  7) acute blood loss anemia--- preop hemoglobin was 11.2, hemoglobin currently down to 8.4 suspect due to perioperative acute blood loss as well as hemodilution from IV fluids -No evidence of ongoing active bleeding at this time, monitor clinically and transfuse as indicated   DVT prophylaxis: Lovenox Code Status: Full code Family Communication:  Discussed with Aunt [name] at bedside  Consultants:   General surgery  Procedures:   Lap chole  Antimicrobials:   Zosyn 2/6 >  Subjective: -Oral intake improving -Had significant nausea today especially when he sat up on the commode, felt somewhat dizzy - -Abdomen remains distended, did have a small BM again  Objective: Vitals:   05/27/19 2000 05/27/19 2017 05/28/19 0411 05/28/19 1436  BP:  (!) 102/48 (!) 110/49 (!) 112/57  Pulse:  80 89 96  Resp:  17 18 16   Temp:  98.2 F (36.8 C) 97.8 F (36.6 C) 98.4 F (36.9 C)  TempSrc:    Oral  SpO2: (!) 89% 100% 96% 98%  Weight:      Height:        Intake/Output  Summary (Last 24 hours) at 05/28/2019 1533 Last data filed at 05/28/2019 1023 Gross per 24 hour  Intake 240 ml  Output 1525 ml  Net -1285 ml   Filed Weights   05/23/19 0222 05/25/19 0501  Weight: 67.5 kg 66.8 kg    Examination: General exam: Alert, awake, no distress  Respiratory system: Clear to auscultation. Respiratory effort normal. Cardiovascular system:RRR. No murmurs, rubs, gallops. Gastrointestinal system: Abdomen is quite distended, soft and nontender.  Diminished bowel sounds , postop wound intact/healing Central nervous system: Generalized weakness, no New focal neurological deficits. Extremities: No C/C/E, +pedal pulses Skin: No rashes, lesions or ulcers Psychiatry: Baseline cognitive and memory deficits,    Data Reviewed:   CBC: Recent Labs  Lab 05/23/19 0311 05/24/19 0616 05/26/19 0500 05/27/19 0424 05/28/19 0407  WBC 9.4 8.8 7.9 8.0 7.6  NEUTROABS 8.4*  --  6.4  --   --   HGB 13.6 11.2* 9.9* 8.6* 8.4*  HCT 45.1 36.7* 33.9* 28.5* 28.6*  MCV 86.7 86.4 89.2 88.8 89.1  PLT 243 196 144* 162 180   Basic Metabolic Panel: Recent Labs  Lab 05/23/19 0311 05/24/19 0616 05/24/19 2211 05/26/19 0500 05/28/19 0407  NA 136 141  --  140 138  K 4.2 3.7  --  3.8 3.2*  CL 99 103  --  105 101  CO2 26 28  --  27 29  GLUCOSE 226* 126* 208* 127* 92  BUN 24* 26*  --  17 11  CREATININE 1.22 1.14  --  0.96 0.75  CALCIUM 10.2 8.9  --  8.1* 8.2*   GFR: Estimated Creatinine Clearance: 81.5 mL/min (by C-G formula based on SCr of 0.75 mg/dL). Liver Function Tests: Recent Labs  Lab 05/23/19 0311 05/24/19 0616 05/26/19 0500  AST 18 11* 44*  ALT 14 9 32  ALKPHOS 96 65 46  BILITOT 0.5 0.8 0.7  PROT 9.5* 6.8 5.9*  ALBUMIN 4.8 3.1* 2.7*   No results for input(s): LIPASE, AMYLASE in the last 168 hours. No results for input(s): AMMONIA in the last 168 hours. Coagulation Profile: No results for input(s): INR, PROTIME in the last 168 hours. Cardiac Enzymes: No results for input(s): CKTOTAL, CKMB, CKMBINDEX, TROPONINI in the last 168 hours. BNP (last 3 results) No results for input(s): PROBNP in the last 8760 hours. HbA1C: No results for input(s): HGBA1C in the last 72 hours. CBG: Recent Labs  Lab 05/27/19 1948 05/27/19 2347 05/28/19  0409 05/28/19 0714 05/28/19 1131  GLUCAP 134* 91 98 97 108*   Lipid Profile: No results for input(s): CHOL, HDL, LDLCALC, TRIG, CHOLHDL, LDLDIRECT in the last 72 hours. Thyroid Function Tests: No results for input(s): TSH, T4TOTAL, FREET4, T3FREE, THYROIDAB in the last 72 hours. Anemia Panel: No results for input(s): VITAMINB12, FOLATE, FERRITIN, TIBC, IRON, RETICCTPCT in the last 72 hours. Sepsis Labs: No results for input(s): PROCALCITON, LATICACIDVEN in the last 168 hours.  Recent Results (from the past 240 hour(s))  SARS CORONAVIRUS 2 (TAT 6-24 HRS) Nasopharyngeal Nasopharyngeal Swab     Status: None   Collection Time: 05/24/19  7:38 PM   Specimen: Nasopharyngeal Swab  Result Value Ref Range Status   SARS Coronavirus 2 NEGATIVE NEGATIVE Final    Comment: (NOTE) SARS-CoV-2 target nucleic acids are NOT DETECTED. The SARS-CoV-2 RNA is generally detectable in upper and lower respiratory specimens during the acute phase of infection. Negative results do not preclude SARS-CoV-2 infection, do not rule out co-infections with other pathogens, and should not be used as  the sole basis for treatment or other patient management decisions. Negative results must be combined with clinical observations, patient history, and epidemiological information. The expected result is Negative. Fact Sheet for Patients: HairSlick.no Fact Sheet for Healthcare Providers: quierodirigir.com This test is not yet approved or cleared by the [place] FDA and  has been authorized for detection and/or diagnosis of SARS-CoV-2 by FDA under an Emergency Use Authorization (EUA). This EUA will remain  in effect (meaning this test can be used) for the duration of the COVID-19 declaration under Section 56 4(b)(1) of the Act, 21 U.S.C. section 360bbb-3(b)(1), unless the authorization is terminated or revoked sooner. Performed at [facility], [address], [place], [place] [address]   Surgical PCR screen     Status: None   Collection Time: 05/24/19  8:48 PM   Specimen: Nasopharyngeal Swab; Nasal Swab  Result Value Ref Range Status   MRSA, PCR NEGATIVE NEGATIVE Final   Staphylococcus aureus NEGATIVE NEGATIVE Final    Comment: (NOTE) The Xpert SA Assay (FDA approved for NASAL specimens in patients 45 years of age and older), is one component of a comprehensive surveillance program. It is not intended to diagnose infection nor to guide or monitor treatment. Performed at [facility], [address] [place], [place] [address]   Respiratory Panel by RT PCR (Flu A&B, Covid) - Nasopharyngeal Swab     Status: None   Collection Time: 05/25/19 10:23 AM   Specimen: Nasopharyngeal Swab  Result Value Ref Range Status   SARS Coronavirus 2 by RT PCR NEGATIVE NEGATIVE Final    Comment: (NOTE) SARS-CoV-2 target nucleic acids are NOT DETECTED. The SARS-CoV-2 RNA is generally detectable in upper respiratoy specimens during the acute phase of infection. The lowest concentration of SARS-CoV-2 viral copies this assay can detect is 131 copies/mL. A negative result does not preclude SARS-Cov-2 infection and should not be used as the sole basis for treatment or other patient management decisions. A negative result may occur with  improper specimen collection/handling, submission of specimen other than nasopharyngeal swab, presence of viral mutation(s) within the areas targeted by this assay, and inadequate number of viral copies (<131 copies/mL). A negative result must be combined with clinical observations, patient history, and epidemiological information. The expected result is Negative. Fact Sheet for Patients:  https://www.moore.com/ Fact Sheet for Healthcare Providers:  https://www.young.biz/ This test is not yet ap proved or cleared by the [place] FDA and  has been authorized for detection and/or  diagnosis of SARS-CoV-2 by FDA under an Emergency Use Authorization (EUA). This EUA will remain  in effect (meaning this test can be used) for the duration of the COVID-19 declaration under Section 564(b)(1) of the Act, 21 U.S.C. section 360bbb-3(b)(1), unless the authorization is terminated or revoked sooner.    Influenza A by PCR NEGATIVE NEGATIVE Final   Influenza B by PCR NEGATIVE NEGATIVE Final    Comment: (NOTE) The Xpert Xpress SARS-CoV-2/FLU/RSV assay is intended as an aid in  the diagnosis of influenza from Nasopharyngeal swab specimens and  should not be used as a sole basis for treatment. Nasal washings and  aspirates are unacceptable for Xpert Xpress SARS-CoV-2/FLU/RSV  testing. Fact Sheet for Patients: https://www.moore.com/ Fact Sheet for Healthcare Providers: https://www.young.biz/ This test is not yet approved or cleared by the [place] FDA and  has been authorized for detection and/or diagnosis of SARS-CoV-2 by  FDA under an Emergency Use Authorization (EUA). This EUA will remain  in effect (meaning this  test can be used) for the duration of the  Covid-19 declaration under Section 564(b)(1) of the Act, 21  U.S.C. section 360bbb-3(b)(1), unless the authorization is  terminated or revoked. Performed at [facility], [address] [place], [place] [address]   Urine Culture     Status: None   Collection Time: 05/25/19  3:17 PM   Specimen: Urine, Catheterized  Result Value Ref Range Status   Specimen Description   Final    URINE, CATHETERIZED Performed at [facility], [address] [place], [place] [address]    Special Requests   Final    NONE Performed at [facility], [address] [place], [place] [address]    Culture   Final    NO GROWTH Performed at [facility], [place] [address], [place], [place] [address]    Report Status 05/27/2019 FINAL  Final    Radiology Studies: No results found.  Scheduled  Meds: . acetaminophen  650 mg Oral Q6H  . enoxaparin (LOVENOX) injection  40 mg Subcutaneous Q24H  . finasteride  5 mg Oral Daily  . insulin aspart  0-9 Units Subcutaneous Q4H  . potassium chloride  40 mEq Oral Q3H  . senna-docusate  2 tablet Oral QHS  . simethicone  80 mg Oral QID  . tamsulosin  0.4 mg Oral QPC supper   Continuous Infusions: . lactated ringers 100 mL/hr at 05/28/19 1246     LOS: 5 days   Roxan Hockey, MD [facility]   If 7PM-7AM, please contact night-coverage [email or web address]  05/28/2019, 3:33 PM

## 2019-05-29 ENCOUNTER — Other Ambulatory Visit (HOSPITAL_COMMUNITY)
Admission: RE | Admit: 2019-05-29 | Discharge: 2019-05-29 | Disposition: A | Discharge status: Home / Self Care | Payer: Medicare Other | Source: Ambulatory Visit | Attending: General Surgery | Admitting: General Surgery

## 2019-05-29 ENCOUNTER — Encounter (HOSPITAL_COMMUNITY): Payer: Self-pay

## 2019-05-29 ENCOUNTER — Encounter (HOSPITAL_COMMUNITY)
Admission: RE | Admit: 2019-05-29 | Discharge: 2019-05-29 | Disposition: A | Discharge status: Home / Self Care | Payer: Medicare Other | Source: Ambulatory Visit | Attending: General Surgery | Admitting: General Surgery

## 2019-05-29 DIAGNOSIS — K811 Chronic cholecystitis: Secondary | ICD-10-CM | POA: Diagnosis not present

## 2019-05-29 DIAGNOSIS — K6389 Other specified diseases of intestine: Secondary | ICD-10-CM | POA: Diagnosis not present

## 2019-05-29 DIAGNOSIS — R140 Abdominal distension (gaseous): Secondary | ICD-10-CM | POA: Diagnosis not present

## 2019-05-29 DIAGNOSIS — K8010 Calculus of gallbladder with chronic cholecystitis without obstruction: Secondary | ICD-10-CM | POA: Diagnosis not present

## 2019-05-29 DIAGNOSIS — E119 Type 2 diabetes mellitus without complications: Secondary | ICD-10-CM | POA: Diagnosis not present

## 2019-05-29 LAB — GLUCOSE, CAPILLARY
Glucose-Capillary: 111 mg/dL — ABNORMAL HIGH (ref 70–99)
Glucose-Capillary: 128 mg/dL — ABNORMAL HIGH (ref 70–99)
Glucose-Capillary: 141 mg/dL — ABNORMAL HIGH (ref 70–99)
Glucose-Capillary: 147 mg/dL — ABNORMAL HIGH (ref 70–99)
Glucose-Capillary: 180 mg/dL — ABNORMAL HIGH (ref 70–99)

## 2019-05-29 LAB — RENAL FUNCTION PANEL
Albumin: 2.5 g/dL — ABNORMAL LOW (ref 3.5–5.0)
Anion gap: 12 (ref 5–15)
BUN: 11 mg/dL (ref 8–23)
CO2: 26 mmol/L (ref 22–32)
Calcium: 8.2 mg/dL — ABNORMAL LOW (ref 8.9–10.3)
Chloride: 102 mmol/L (ref 98–111)
Creatinine, Ser: 0.73 mg/dL (ref 0.61–1.24)
GFR calc Af Amer: >60 mL/min (ref >60)
GFR calc non Af Amer: >60 mL/min (ref >60)
Glucose, Bld: 134 mg/dL — ABNORMAL HIGH (ref 70–99)
Phosphorus: 2.3 mg/dL — ABNORMAL LOW (ref 2.5–4.6)
Potassium: 3.3 mmol/L — ABNORMAL LOW (ref 3.5–5.1)
Sodium: 140 mmol/L (ref 135–145)

## 2019-05-29 LAB — MAGNESIUM: Magnesium: 1.8 mg/dL (ref 1.7–2.4)

## 2019-05-29 MED ORDER — FINASTERIDE 5 MG PO TABS: 5.0000 mg | ORAL_TABLET | Freq: Every day | ORAL | 3 refills | Status: DC

## 2019-05-29 MED ORDER — POTASSIUM CHLORIDE ER 20 MEQ PO TBCR: 20.0000 meq | EXTENDED_RELEASE_TABLET | ORAL | 0 refills | Status: AC

## 2019-05-29 MED ORDER — POLYETHYLENE GLYCOL 3350 17 G PO PACK: 17.0000 g | PACK | Freq: Every day | ORAL | 2 refills | Status: DC

## 2019-05-29 MED ORDER — HYDROCODONE-ACETAMINOPHEN 5-325 MG PO TABS: 1.0000 | ORAL_TABLET | Freq: Four times a day (QID) | ORAL | 0 refills | Status: DC | PRN

## 2019-05-29 MED ORDER — LINACLOTIDE 145 MCG PO CAPS: 145.0000 ug | ORAL_CAPSULE | Freq: Every day | ORAL | 5 refills | Status: DC

## 2019-05-29 MED ORDER — POTASSIUM CHLORIDE CRYS ER 20 MEQ PO TBCR
40.0000 meq | EXTENDED_RELEASE_TABLET | Freq: Once | ORAL | Status: AC
  Administered 2019-05-29: 40 meq via ORAL
  Filled 2019-05-29: qty 2

## 2019-05-29 MED ORDER — SIMETHICONE 40 MG/0.6ML PO SUSP: 80.0000 mg | Freq: Four times a day (QID) | ORAL | 2 refills | Status: DC

## 2019-05-29 MED ORDER — TAMSULOSIN HCL 0.4 MG PO CAPS: 0.4000 mg | ORAL_CAPSULE | Freq: Every evening | ORAL | 3 refills | Status: DC

## 2019-05-29 MED ORDER — ASPIRIN EC 81 MG PO TBEC: 81.0000 mg | DELAYED_RELEASE_TABLET | Freq: Every day | ORAL | 2 refills | Status: AC

## 2019-05-29 MED ORDER — METFORMIN HCL 850 MG PO TABS: 850.0000 mg | ORAL_TABLET | Freq: Two times a day (BID) | ORAL | 4 refills | Status: DC

## 2019-05-29 MED ORDER — SENNOSIDES-DOCUSATE SODIUM 8.6-50 MG PO TABS: 2.0000 | ORAL_TABLET | Freq: Every day | ORAL | 2 refills | Status: DC

## 2019-05-29 NOTE — Progress Notes (Signed)
Bladder scan 283 ml, in and out cath 300 ml clear yellow urine. Pt tolerated well

## 2019-05-29 NOTE — Discharge Instructions (Signed)
--1)Please note that there has been several changes to your medications 2)Please follow-up with general surgeon Dr. Curlene Labrum as advised for wound check and reevaluation 3)You will need a BMP blood test and a CBC blood test when you see Dr. Constance Haw 4)Avoid ibuprofen/Advil/Aleve/Motrin/Goody Powders/Naproxen/BC powders/Meloxicam/Diclofenac/Indomethacin and other Nonsteroidal anti-inflammatory medications as these will make you more likely to bleed and can cause stomach ulcers, can also cause Kidney problems.   Discharge Open Abdominal Surgery Instructions:  Common Complaints: Pain at the incision site is common. This will improve with time. Take your pain medications as described below. Some nausea is common and poor appetite. The main goal is to stay hydrated the first few days after surgery.   Diet/ Activity: Diet as tolerated. You have started and tolerated a diet in the hospital, and should continue to increase what you are able to eat.   You may not have a large appetite, but it is important to stay hydrated. Drink 64 ounces of water a day. Your appetite will return with time.  Keep a dry dressing in place over your staples daily or as needed. Some minor pink/ blood tinged drainage is expected. This will stop in a few days after surgery.  Shower per your regular routine daily.  Do not take hot showers. Take warm showers that are less than 10 minutes. Path the incision dry. Wear an abdominal binder daily with activity. You do not have to wear this while sleeping or sitting.  Rest and listen to your body, but do not remain in bed all day.  Walk everyday for at least 15-20 minutes. Deep cough and move around every 1-2 hours in the first few days after surgery.  Do not lift > 10 lbs, perform excessive bending, pushing, pulling, squatting for 6-8 weeks after surgery.  The activity restrictions and the abdominal binder are to prevent hernia formation at your incision while you are healing.    Do not place lotions or balms on your incision unless instructed to specifically by Dr. Constance Haw.   Pain Expectations and Narcotics: -After surgery you will have pain associated with your incisions and this is normal. The pain is muscular and nerve pain, and will get better with time. -You are encouraged and expected to take non narcotic medications like tylenol and ibuprofen (when able) to treat pain as multiple modalities can aid with pain treatment. -Narcotics are only used when pain is severe or there is breakthrough pain. -You are not expected to have a pain score of 0 after surgery, as we cannot prevent pain. A pain score of 3-4 that allows you to be functional, move, walk, and tolerate some activity is the goal. The pain will continue to improve over the days after surgery and is dependent on your surgery. -Due to [place] law, we are only able to give a certain amount of pain medication to treat post operative pain, and we only give additional narcotics on a patient by patient basis.  -For most laparoscopic surgery, studies have shown that the majority of patients only need 10-15 narcotic pills, and for open surgeries most patients only need 15-20.   -Having appropriate expectations of pain and knowledge of pain management with non narcotics is important as we do not want anyone to become addicted to narcotic pain medication.  -Using ice packs in the first 48 hours and heating pads after 48 hours, wearing an abdominal binder (when recommended), and using over the counter medications are all ways to help with pain management.   -  Simple acts like meditation and mindfulness practices after surgery can also help with pain control and research has proven the benefit of these practices.  Medication: Take tylenol and ibuprofen as needed for pain control, alternating every 4-6 hours.  Example:  Tylenol 1000mg  @ 6am, 12noon, 6pm, (Do not exceed 4000mg  of tylenol a day). Ibuprofen 800mg  @ 9am,  3pm, 9pm, 3am (Do not exceed 3600mg  of ibuprofen a day).  Take Roxicodone for breakthrough pain every 4 hours.  Take Colace for constipation related to narcotic pain medication. If you do not have a bowel movement in 2 days, take Miralax over the counter.  Drink plenty of water to also prevent constipation.   Contact Information: If you have questions or concerns, please call our office, [phone], Monday- Thursday 8AM-5PM and Friday 8AM-12Noon.  If it is after hours or on the weekend, please call [facility] Main [facility], [phone], and ask to speak to the surgeon on call for Dr. Monday at [facility].    Open Cholecystectomy, Care After This sheet gives you information about how to care for yourself after your procedure. Your health care provider may also give you more specific instructions. If you have problems or questions, contact your health care provider. What can I expect after the procedure? After the procedure, it is common to have:  Pain at your incision site. You will be given medicines to control this pain.  Mild nausea or vomiting. Follow these instructions at home: Incision care   Follow instructions from your health care provider about how to take care of your incision. Make sure you: ? Wash your hands with soap and water before you change your bandage (dressing). If soap and water are not available, use hand sanitizer. ? Change your dressing as told by your health care provider. ? Leave staples, stitches (sutures), skin glue, or adhesive strips in place. These skin closures may need to be in place for 2 weeks or longer.  Do not take baths, swim, or use a hot tub until your health care provider approves.   You may shower.   Check your incision area every day for signs of infection. Check for: ? More redness, swelling, or pain. ? More fluid or blood. ? Warmth. ? Pus or a bad smell. Activity  Do not drive or use heavy machinery while taking prescription pain  medicine.  Do not lift anything that is heavier than 10 lb (4.5 kg) until your health care provider approves.  Do not play contact sports until your health care provider approves.  Do not drive for 24 hours if you were given a medicine to help you relax (sedative).  Rest as needed. Do not return to work or school until your health care provider approves. General instructions  Take over-the-counter and prescription medicines only as told by your health care provider.  To prevent or treat constipation while you are taking prescription pain medicine, your health care provider may recommend that you: ? Drink enough fluid to keep your urine clear or pale yellow. ? Take over-the-counter or prescription medicines. ? Eat foods that are high in fiber, such as fresh fruits and vegetables, whole grains, and beans. ? Limit foods that are high in fat and processed sugars, such as fried and sweet foods. Contact a health care provider if:  You develop a rash.  You have more redness, swelling, or pain around your incision.  You have more fluid or blood coming from your incision.  Your incision feels warm to  the touch.  You have pus or a bad smell coming from your incision.  You have a fever.  Your incision breaks open. Get help right away if:  You have trouble breathing.  You have chest pain.  You have increasing pain in your shoulders.  You faint or feel dizzy when you stand.  You have severe pain in your abdomen.  You have nausea or vomiting that lasts for more than one day.  You have leg pain. This information is not intended to replace advice given to you by your health care provider. Make sure you discuss any questions you have with your health care provider. Document Revised: 09/26/2018 Document Reviewed: 09/19/2015 Elsevier Patient Education  2020 [employer].

## 2019-05-29 NOTE — Discharge Summary (Signed)
[name], is a 69 y.o. male  DOB 28-Aug-1950  MRN [id number].  Admission date:  05/23/2019  Admitting Physician  Oswald Hillock, MD  Discharge Date:  05/29/2019   Primary MD  Jani Gravel, MD  Recommendations for primary care physician for things to follow:   1)Please note that there has been several changes to your medications 2)Please follow-up with general surgeon Dr. Curlene Labrum as advised for wound check and reevaluation 3)You will need a BMP blood test and a CBC blood test when you see Dr. Constance Haw 4)Avoid ibuprofen/Advil/Aleve/Motrin/Goody Powders/Naproxen/BC powders/Meloxicam/Diclofenac/Indomethacin and other Nonsteroidal anti-inflammatory medications as these will make you more likely to bleed and can cause stomach ulcers, can also cause Kidney problems.  Admission Diagnosis  Abdominal distension [R14.0]   Discharge Diagnosis  Abdominal distension [R14.0]   Principal Problem:   Pneumatosis intestinalis Active Problems:   Chronic cholecystitis   Megacolon   Diabetes mellitus type 2, noninsulin dependent (HCC)   Intellectual disability   BPH (benign prostatic hyperplasia)   Pneumoperitoneum of unknown etiology   Abdominal distension   SBO (small bowel obstruction) (HCC)   Ileus (HCC)      Past Medical History:  Diagnosis Date  . Diabetes mellitus without complication ([place])   . History of gout   . History of kidney stones   . Hypertension   . Mentally challenged   . Urinary retention     Past Surgical History:  Procedure Laterality Date  . CHOLECYSTECTOMY N/A 05/25/2019   Procedure: OPEN CHOLECYSTECTOMY;  Surgeon: Virl Cagey, MD;  Location: [facility];  Service: General;  Laterality: N/A;  . CYSTOSCOPY W/ URETERAL STENT PLACEMENT Bilateral 06/20/2016   Procedure: CYSTOSCOPY WITH RETROGRADE PYELOGRAM/URETERAL STENT PLACEMENT;  Surgeon: Cleon Gustin, MD;  Location: [facility];   Service: Urology;  Laterality: Bilateral;  . CYSTOSCOPY WITH RETROGRADE PYELOGRAM, URETEROSCOPY AND STENT PLACEMENT Bilateral 08/20/2016   Procedure: CYSTOSCOPY WITH BILATERAL RETROGRADE PYELOGRAM, BILATERAL URETEROSCOPY,  LASER LITHOTRIPSY OF RIGHT URETERAL CALCULI, STONE BASKET EXTRACTION LEFT URETERAL CALCULI  AND BILATERAL URETERAL STENT EXCHANGE;  Surgeon: Cleon Gustin, MD;  Location: [facility];  Service: Urology;  Laterality: Bilateral;  . CYSTOSCOPY WITH RETROGRADE PYELOGRAM, URETEROSCOPY AND STENT PLACEMENT Left 01/14/2019   Procedure: CYSTOSCOPY WITH LEFT RETROGRADE PYELOGRAM, LEFT URETEROSCOPY AND LEFT URETERAL STENT PLACEMENT;  Surgeon: Cleon Gustin, MD;  Location: [facility];  Service: Urology;  Laterality: Left;  . CYSTOSCOPY WITH URETHRAL DILATATION  01/14/2019   Procedure: CYSTOSCOPY WITH URETHRAL DILATATION;  Surgeon: Cleon Gustin, MD;  Location: [facility];  Service: Urology;;  . HOLMIUM LASER APPLICATION Bilateral 0/12/4707   Procedure: HOLMIUM LASER APPLICATION;  Surgeon: Cleon Gustin, MD;  Location: [facility];  Service: Urology;  Laterality: Bilateral;  . HOLMIUM LASER APPLICATION Left 10/11/3660   Procedure: HOLMIUM LASER APPLICATION;  Surgeon: Cleon Gustin, MD;  Location: [facility];  Service: Urology;  Laterality: Left;  . IR CATHETER TUBE CHANGE  07/04/2018  . IR EXCHANGE BILIARY DRAIN  06/09/2018  . IR EXCHANGE  BILIARY DRAIN  10/20/2018  . IR EXCHANGE BILIARY DRAIN  01/12/2019  . IR EXCHANGE BILIARY DRAIN  04/07/2019  . IR PERC CHOLECYSTOSTOMY  05/24/2018  . LAPAROTOMY N/A 08/25/2018   Procedure: EXPLORATORY LAPAROTOMY reduction of volvulus;  Surgeon: [name], MD;  Location: [facility];  Service: General;  Laterality: N/A;  . LAPAROTOMY N/A 05/25/2019   Procedure: EXPLORATORY LAPAROTOMY;  Surgeon: [name], MD;  Location: [facility];  Service: General;  Laterality: N/A;       HPI  from the history and physical done on the day of admission:    [name]  is a 69 y.o. male, with history of mental retardation, urinary retention, volvulus, had expiratory laparotomy with reduction of volvulus on 08/26/2018, patient have chronic abdominal distention due to intestinal dysmotility, s/p cholecystostomy tube for cholecystitis in January 2020, recently cholecystostomy drain was replaced by surgery, today was brought to the ED after abdominal distention became worse.  Also multiple episodes of dry heaving. In the ED CT scan of the abdomen and pelvis showed moderate to marked distention of the stomach and small bowel with pneumatosis within the stomach wall and within the  small bowel loop walls.  NG tube placed in the ED.  General surgery consulted. Denies chest pain or shortness of breath. Complains of abdominal pain   Hospital Course:   Brief Narrative:  HPI per Dr. Darrol Angel a68 y.o.male,with history of mental retardation, urinary retention,volvulus,had expiratory laparotomy with reduction of volvulus on 08/26/2018,patient have chronic abdominal distention due to intestinal dysmotility, s/p cholecystostomy tube for cholecystitis in January 2020, recently cholecystostomy drain was replaced by surgery, today was brought to the ED after abdominal distention became worse. Also multiple episodes of dry heaving. In the ED CT scan of the abdomen and pelvis showed moderate to marked distention of the stomach and small bowel with pneumatosis within the stomach wall and within the small bowel loop walls. NG tube placed in the ED. General surgery consulted. Denies chest pain or shortness of breath. Complains of abdominal pain   Assessment & Plan:   Active Problems:   Diabetes mellitus type 2, noninsulin dependent (HCC)   BPH (benign prostatic hyperplasia)   Abdominal distension   Pneumatosis intestinalis   Chronic cholecystitis   1)Abdominal Distention-- ileus Versus small bowel obstruction. Noted to have pneumatosis in  stomach. Seen by general surgery and underwent operative management on 05/25/19.  - Completed IV Zosyn --- -  General surgery input appreciated -Abdominal distention improved, having BMs, Tolerating solid food -General surgery would like to increase his simethicone   2)Chronic cholecystitis/cholelithiasis-- s/p cholecystectomy on 05/25/19. General surgery input appreciated  -Completed IV Zosyn  3)BPH-- Patient has history of urinary retention and does in and out cath 3 times a day. restarted  Flomax/finasteride   -Actually able to void spontaneously at times C/n Home cath protocol  4)Diabetes. -- Restart Metformin.     5)Functional status/Disposition-  Patient's family reports that at baseline, he ambulates with the assistance of a walker.   PT eval appreciated, recommends SNF rehab, patient's aunt/caregiver declines SNF rehab, she requested discharge home with home health services   6)FEN/Hypokalemia --- replaced and rechecked, oral intake is improved  7) acute blood loss anemia--- preop hemoglobin was 11.2, hemoglobin currently stable at 8.4 suspect due to perioperative acute blood loss as well as hemodilution from IV fluids -No evidence of ongoing active bleeding at this time,   Code Status: Full code Family Communication:  Discussed with Aunt  [name] at bedside  Consultants:   General surgery  Procedures:   Lap chole  Antimicrobials:   Zosyn 2/6 >  Discharge Condition: Stable  Follow UP  Follow-up Information    [facility].   Specialty: Home Health Services Why: RN/PT       Lucretia RoersBridges, Lindsay C, MD. Schedule an appointment as soon as possible for a visit in 1 week(s).   Specialty: General Surgery Contact information: [address] [place] [address] [phone]           Consults obtained -   Diet and Activity recommendation:  As advised  Discharge Instructions    Discharge Instructions    Call MD  for:  difficulty breathing, headache or visual disturbances   Complete by: As directed    Call MD for:  extreme fatigue   Complete by: As directed    Call MD for:  persistant dizziness or light-headedness   Complete by: As directed    Call MD for:  persistant nausea and vomiting   Complete by: As directed    Call MD for:  temperature >100.4   Complete by: As directed    Diet - low sodium heart healthy   Complete by: As directed    Diet Carb Modified   Complete by: As directed    Discharge instructions   Complete by: As directed    1)Please note that there has been several changes to your medications 2)Please follow-up with general surgeon Dr. Algis GreenhouseLindsay Bridges as advised for wound check and reevaluation 3)You will need a BMP blood test and a CBC blood test when you see Dr. [name] 4)Avoid ibuprofen/Advil/Aleve/Motrin/Goody Powders/Naproxen/BC powders/Meloxicam/Diclofenac/Indomethacin and other Nonsteroidal anti-inflammatory medications as these will make you more likely to bleed and can cause stomach ulcers, can also cause Kidney problems.   Increase activity slowly   Complete by: As directed       Discharge Medications     Allergies as of 05/29/2019   No Known Allergies     Medication List    TAKE these medications   acetaminophen 500 MG tablet Commonly known as: TYLENOL Take 1,000 mg by mouth every 6 (six) hours as needed for mild pain or headache.   aspirin EC 81 MG tablet Take 1 tablet (81 mg total) by mouth daily with breakfast. What changed: when to take this   finasteride 5 MG tablet Commonly known as: PROSCAR Take 1 tablet (5 mg total) by mouth daily.   HYDROcodone-acetaminophen 5-325 MG tablet Commonly known as: Norco Take 1 tablet by mouth every 6 (six) hours as needed for moderate pain. What changed: when to take this   linaclotide 145 MCG Caps capsule Commonly known as: LINZESS Take 1 capsule (145 mcg total) by mouth daily before breakfast.   magnesium  oxide 400 MG tablet Commonly known as: MAG-OX Take 1 tablet (400 mg total) by mouth daily.   metFORMIN 850 MG tablet Commonly known as: Glucophage Take 1 tablet (850 mg total) by mouth 2 (two) times daily with a meal. What changed:   medication strength  how much to take  when to take this   polyethylene glycol 17 g packet Commonly known as: MIRALAX / GLYCOLAX Take 17 g by mouth daily. What changed:   when to take this  reasons to take this   Potassium Chloride ER 20 MEQ Tbcr Take 20 mEq by mouth every Tuesday, Thursday, Saturday, and Sunday. 1 tab daily by mouth Start taking on: May 30, 2019   senna-docusate 8.6-50 MG tablet Commonly known as: Senokot-S Take 2 tablets by mouth at bedtime.   simethicone 40 MG/0.6ML drops Commonly known as: MYLICON Take 1.2 mLs (80 mg total) by mouth 4 (four) times daily.   tamsulosin 0.4 MG Caps capsule Commonly known as: FLOMAX Take 1 capsule (0.4 mg total) by mouth every evening.       Major procedures and Radiology Reports - PLEASE review detailed and final reports for all details, in brief -    CT ABDOMEN PELVIS W CONTRAST  Result Date: 05/23/2019 CLINICAL DATA:  69 year old male with abdominal pain and distension. Patient with chronic indwelling percutaneous cholecystostomy tube. EXAM: CT ABDOMEN AND PELVIS WITH CONTRAST TECHNIQUE: Multidetector CT imaging of the abdomen and pelvis was performed using the standard protocol following bolus administration of intravenous contrast. CONTRAST:  OMNIPAQUE IOHEXOL 300 MG/ML  SOLN COMPARISON:  12/25/2018 and prior CTs FINDINGS: Lower chest: Mild to moderate bibasilar atelectasis/scarring noted. Hepatobiliary: Portal venous gas is now identified. No focal hepatic abnormalities are identified. A percutaneous cholecystostomy tube is present. Pancreas: No significant abnormalities Spleen: Unremarkable Adrenals/Urinary Tract: Multiple nonobstructing LEFT renal calculi are again  identified, the largest measuring 1.3 cm. No hydronephrosis or obstructing urinary calculi identified. No significant RIGHT renal abnormalities noted. The adrenal glands are unremarkable. Chronic bladder wall thickening again identified. Stomach/Bowel: Moderate to marked distension of the stomach and small bowel noted with distension of the proximal colon. There is a moderate amount of stool within the mid and distal colon. No discrete transition point is identified. Pneumatosis within the gastric wall and several small bowel loops within the LEFT abdomen noted. No definite bowel wall thickening identified. Vascular/Lymphatic: The visualized mesenteric arteries and veins are patent. There is some gas noted within SMV. No enlarged lymph nodes are identified. Reproductive: Unchanged Other: No ascites, abscess or pneumoperitoneum. Musculoskeletal: No acute or suspicious bony abnormalities. Degenerative changes in the lumbar spine again noted. IMPRESSION: 1. Moderate to marked distension of the stomach and small bowel with pneumatosis within the stomach wall and within several LEFT small bowel loop walls. This is nonspecific and may be related to pressure from bowel distension, but infection, ischemia and another causes are considerations. The visualized mesenteric vessels are patent. No discrete bowel transition point in the proximal colon is distended to the point of moderate colonic stool. No evidence of pneumoperitoneum, abscess or ascites. 2. Percutaneous cholecystostomy tube. 3. Nonobstructing LEFT renal calculi. 4. Mild to moderate bibasilar atelectasis/scarring. Critical Value/emergent results were called by telephone at the time of interpretation on 05/23/2019 at 5:12 am to provider [facility] , who verbally acknowledged these results. Electronically Signed   By: Harmon Pier M.D.   On: 05/23/2019 05:12   DG Chest Port 1 View  Result Date: 05/23/2019 CLINICAL DATA:  Nasogastric tube placement. EXAM: PORTABLE  CHEST 1 VIEW COMPARISON:  Same day. FINDINGS: Stable cardiomediastinal silhouette. Nasogastric tube tip is seen in proximal stomach. No pneumothorax or pleural effusion is noted. Hypoinflation of the lungs is noted with minimal bibasilar subsegmental atelectasis. Bony thorax is unremarkable. IMPRESSION: Nasogastric tube tip seen in proximal stomach. Hypoinflation of the lungs with minimal bibasilar subsegmental atelectasis. Electronically Signed   By: Lupita Raider M.D.   On: 05/23/2019 13:21   DG Chest Portable 1 View  Result Date: 05/23/2019 CLINICAL DATA:  NG tube placement. EXAM: PORTABLE CHEST 1 VIEW COMPARISON:  09/06/2010 chest radiograph FINDINGS: An NG tube is noted with tip overlying the proximal-mid stomach. Cardiomediastinal  silhouette is unchanged with low lung volumes and mild bibasilar atelectasis. No definite pleural effusion pneumothorax. IMPRESSION: NG tube with tip overlying the proximal-mid stomach. Electronically Signed   By: Harmon Pier M.D.   On: 05/23/2019 05:47    Micro Results   Recent Results (from the past 240 hour(s))  SARS CORONAVIRUS 2 (TAT 6-24 HRS) Nasopharyngeal Nasopharyngeal Swab     Status: None   Collection Time: 05/24/19  7:38 PM   Specimen: Nasopharyngeal Swab  Result Value Ref Range Status   SARS Coronavirus 2 NEGATIVE NEGATIVE Final    Comment: (NOTE) SARS-CoV-2 target nucleic acids are NOT DETECTED. The SARS-CoV-2 RNA is generally detectable in upper and lower respiratory specimens during the acute phase of infection. Negative results do not preclude SARS-CoV-2 infection, do not rule out co-infections with other pathogens, and should not be used as the sole basis for treatment or other patient management decisions. Negative results must be combined with clinical observations, patient history, and epidemiological information. The expected result is Negative. Fact Sheet for Patients: HairSlick.no Fact Sheet for  Healthcare Providers: quierodirigir.com This test is not yet approved or cleared by the [place] FDA and  has been authorized for detection and/or diagnosis of SARS-CoV-2 by FDA under an Emergency Use Authorization (EUA). This EUA will remain  in effect (meaning this test can be used) for the duration of the COVID-19 declaration under Section 56 4(b)(1) of the Act, 21 U.S.C. section 360bbb-3(b)(1), unless the authorization is terminated or revoked sooner. Performed at [facility], [address] [place], [place] [address]   Surgical PCR screen     Status: None   Collection Time: 05/24/19  8:48 PM   Specimen: Nasopharyngeal Swab; Nasal Swab  Result Value Ref Range Status   MRSA, PCR NEGATIVE NEGATIVE Final   Staphylococcus aureus NEGATIVE NEGATIVE Final    Comment: (NOTE) The Xpert SA Assay (FDA approved for NASAL specimens in patients 9 years of age and older), is one component of a comprehensive surveillance program. It is not intended to diagnose infection nor to guide or monitor treatment. Performed at [facility], [address] [place], [place] [address]   Respiratory Panel by RT PCR (Flu A&B, Covid) - Nasopharyngeal Swab     Status: None   Collection Time: 05/25/19 10:23 AM   Specimen: Nasopharyngeal Swab  Result Value Ref Range Status   SARS Coronavirus 2 by RT PCR NEGATIVE NEGATIVE Final    Comment: (NOTE) SARS-CoV-2 target nucleic acids are NOT DETECTED. The SARS-CoV-2 RNA is generally detectable in upper respiratoy specimens during the acute phase of infection. The lowest concentration of SARS-CoV-2 viral copies this assay can detect is 131 copies/mL. A negative result does not preclude SARS-Cov-2 infection and should not be used as the sole basis for treatment or other patient management decisions. A negative result may occur with  improper specimen collection/handling, submission of specimen other than nasopharyngeal swab,  presence of viral mutation(s) within the areas targeted by this assay, and inadequate number of viral copies (<131 copies/mL). A negative result must be combined with clinical observations, patient history, and epidemiological information. The expected result is Negative. Fact Sheet for Patients:  https://www.moore.com/ Fact Sheet for Healthcare Providers:  https://www.young.biz/ This test is not yet ap proved or cleared by the [place] FDA and  has been authorized for detection and/or diagnosis of SARS-CoV-2 by FDA under an Emergency Use Authorization (EUA). This EUA will remain  in effect (meaning this test can be used)  for the duration of the COVID-19 declaration under Section 564(b)(1) of the Act, 21 U.S.C. section 360bbb-3(b)(1), unless the authorization is terminated or revoked sooner.    Influenza A by PCR NEGATIVE NEGATIVE Final   Influenza B by PCR NEGATIVE NEGATIVE Final    Comment: (NOTE) The Xpert Xpress SARS-CoV-2/FLU/RSV assay is intended as an aid in  the diagnosis of influenza from Nasopharyngeal swab specimens and  should not be used as a sole basis for treatment. Nasal washings and  aspirates are unacceptable for Xpert Xpress SARS-CoV-2/FLU/RSV  testing. Fact Sheet for Patients: https://www.moore.com/https://www.fda.gov/media/142436/download Fact Sheet for Healthcare Providers: https://www.young.biz/https://www.fda.gov/media/142435/download This test is not yet approved or cleared by the [place] FDA and  has been authorized for detection and/or diagnosis of SARS-CoV-2 by  FDA under an Emergency Use Authorization (EUA). This EUA will remain  in effect (meaning this test can be used) for the duration of the  Covid-19 declaration under Section 564(b)(1) of the Act, 21  U.S.C. section 360bbb-3(b)(1), unless the authorization is  terminated or revoked. Performed at [facility], [address] [place], [place] [address]   Urine Culture     Status: None    Collection Time: 05/25/19  3:17 PM   Specimen: Urine, Catheterized  Result Value Ref Range Status   Specimen Description   Final    URINE, CATHETERIZED Performed at [facility], [address] [place], [place] [address]    Special Requests   Final    NONE Performed at [facility], [address] [place], [place] [address]    Culture   Final    NO GROWTH Performed at [facility], [address] [place], [place] [address]    Report Status 05/27/2019 FINAL  Final       Today   Subjective    [name] today has no new complaints -Eating and drinking well, having BMs   Patient has been seen and examined prior to discharge   Objective   Blood pressure (!) 119/59, pulse (!) 105, temperature 99.2 F (37.3 C), temperature source Oral, resp. rate 16, height 5\' 7"  (1.702 m), weight 66.8 kg, SpO2 97 %.   Intake/Output Summary (Last 24 hours) at 05/29/2019 1138 Last data filed at 05/29/2019 0600 Gross per 24 hour  Intake --  Output 600 ml  Net -600 ml    Exam Gen:- Awake Alert, no acute distress  HEENT:- [place].AT, No sclera icterus Neck-Supple Neck,No JVD,.  Lungs-  CTAB , good air movement bilaterally  CV- S1, S2 normal, regular Abd-  Abdomen is less distended, soft and nontender.  Improved bowel sounds,, postop wound intact/healing Extremity/Skin:- No  edema,   good pulses Psych-Baseline cognitive and memory deficits Neuro-generalized weakness, no new focal deficits, no tremors    Data Review   CBC w Diff:  Lab Results  Component Value Date   WBC 7.6 05/28/2019   HGB 8.4 (L) 05/28/2019   HCT 28.6 (L) 05/28/2019   PLT 180 05/28/2019   LYMPHOPCT 11 05/26/2019   MONOPCT 7 05/26/2019   EOSPCT 1 05/26/2019   BASOPCT 0 05/26/2019    CMP:  Lab Results  Component Value Date   NA 140 05/29/2019   K 3.3 (L) 05/29/2019   CL 102 05/29/2019   CO2 26 05/29/2019   BUN 11 05/29/2019   CREATININE 0.73 05/29/2019   PROT 5.9 (L) 05/26/2019   ALBUMIN 2.5 (L)  05/29/2019   BILITOT 0.7 05/26/2019   ALKPHOS 46 05/26/2019   AST 44 (  H) 05/26/2019   ALT 32 05/26/2019  . Total Discharge time is about 33 minutes  Shon Hale M.D on 05/29/2019 at 11:38 AM  Go to [email or web address] -  for contact info  [facility] - Office  [phone]

## 2019-05-29 NOTE — Progress Notes (Signed)
[facility]  Patient remains distended which is his baseline. Tolerating diet and having Bms.   BP (!) 119/59 (BP Location: Left Arm)   Pulse (!) 105   Temp 99.2 F (37.3 C) (Oral)   Resp 16   Ht 5\' 7"  (1.702 m)   Wt 66.8 kg   SpO2 97%   BMI 23.07 kg/m   Abd distended and soft, staples in place and clean, no erythema and drainage, drain site with staples and no erythema or drainage  Mr. Bua is 69 yo POD 4 s/p Open cholecystectomy. Doing fair.  Diet as tolerated. Continue home bowel regimen. Spoke with 77 and she understands plan.   Updated Dr. [name].  Future Appointments  Date Time Provider Department Center  05/29/2019 11:30 AM [facility]-DOIBP PAT 2 AP-DOIBP None  06/09/2019  9:45 AM 06/11/2019, MD RS-RS None  07/06/2019  2:00 PM [facility]-IR 1 [facility]IR [facility]  09/02/2019  2:00 PM McKenzie, 09/04/2019, MD AUR-AUR None  09/29/2019 11:15 AM Rehman, 10/01/2019, MD NRE-NRE None     Joline Maxcy, MD [facility] [address] [place], [place] [address] [phone] (office)

## 2019-06-08 ENCOUNTER — Telehealth: Payer: Self-pay

## 2019-06-08 NOTE — Telephone Encounter (Signed)
Patient caregiver [name]  is asking about lab work- patient has paperwork from hospital discharge stating patient needs CBC,CMP labs done.   Spoke with Dr.Bridges and was told to let patient know labs can be discussed at office visit tomorrow.

## 2019-06-09 ENCOUNTER — Encounter: Payer: Self-pay | Admitting: General Surgery

## 2019-06-09 ENCOUNTER — Ambulatory Visit (INDEPENDENT_AMBULATORY_CARE_PROVIDER_SITE_OTHER): Payer: Self-pay | Admitting: General Surgery

## 2019-06-09 ENCOUNTER — Other Ambulatory Visit: Payer: Self-pay

## 2019-06-09 VITALS — BP 124/73 | HR 74 | Temp 97.7°F | Resp 14 | Ht 67.0 in | Wt 128.0 lb

## 2019-06-09 DIAGNOSIS — K811 Chronic cholecystitis: Secondary | ICD-10-CM

## 2019-06-10 NOTE — Progress Notes (Signed)
[facility] Note   HPI:  69 y.o. Male presents to clinic for post-op follow-up evaluation after an open cholecystectomy. He is doing well. His aunt [name] is with him and they are happy the cholecystostomy tube is out.   Review of Systems:  Having Bms No fevers or chills No pain All other review of systems: otherwise negative   Vital Signs:  BP 124/73   Pulse 74   Temp 97.7 F (36.5 C) (Oral)   Resp 14   Ht 5\' 7"  (1.702 m)   Wt 128 lb (58.1 kg)   SpO2 96%   BMI 20.05 kg/m    Physical Exam:  Physical Exam Vitals reviewed.  HENT:     Head: Normocephalic.  Cardiovascular:     Rate and Rhythm: Normal rate.  Pulmonary:     Effort: Pulmonary effort is normal.  Abdominal:     General: There is distension.     Palpations: Abdomen is soft.     Tenderness: There is no abdominal tenderness.     Comments: Normal distention, staples intact, skin closed, no signs of hernia but distention/ diastasis, every other staple removed, and steri strips placed between   Neurological:     Mental Status: He is alert.      Assessment:  69 y.o. yo Male s/p open cholecystostomy with midline incision, healing. Left staples in for 2 weeks and leaving half of them in for another week. Steri in place. Do not want patient to have dehiscence given the thin nature of his abdominal wall.   Plan:  - Continue diet as tolerated   - No heavy lifting - Will take out remaining staples next week   Future Appointments  Date Time Provider Department Center  06/18/2019  1:00 PM 08/18/2019, MD RS-RS None  07/06/2019  2:00 PM MC-IR 1 MC-IR [facility]  09/02/2019  2:00 PM McKenzie, 09/04/2019, MD AUR-AUR None  09/29/2019 11:15 AM Rehman, 10/01/2019, MD NRE-NRE None    ll of the above recommendations were discussed with the patient and patient's family, and all of patient's and family's questions were answered to their expressed satisfaction.  Joline Maxcy, MD [facility] [address] [place], [place] [phone] (office)

## 2019-06-16 ENCOUNTER — Telehealth (HOSPITAL_COMMUNITY): Payer: Self-pay

## 2019-06-16 ENCOUNTER — Other Ambulatory Visit (HOSPITAL_COMMUNITY): Payer: Self-pay | Admitting: Interventional Radiology

## 2019-06-16 DIAGNOSIS — Z0000 Encounter for general adult medical examination without abnormal findings: Secondary | ICD-10-CM

## 2019-06-16 NOTE — Telephone Encounter (Signed)
Pt had gallbladder removed. No longer has drain. AW

## 2019-06-18 ENCOUNTER — Other Ambulatory Visit: Payer: Self-pay

## 2019-06-18 ENCOUNTER — Ambulatory Visit (INDEPENDENT_AMBULATORY_CARE_PROVIDER_SITE_OTHER): Payer: Self-pay | Admitting: General Surgery

## 2019-06-18 ENCOUNTER — Encounter: Payer: Self-pay | Admitting: General Surgery

## 2019-06-18 VITALS — BP 125/70 | HR 73 | Temp 97.6°F | Resp 14 | Ht 67.0 in | Wt 124.0 lb

## 2019-06-18 DIAGNOSIS — K811 Chronic cholecystitis: Secondary | ICD-10-CM

## 2019-06-18 NOTE — Progress Notes (Signed)
[facility] Note   HPI:  69 y.o. Male presents to clinic for post-op follow-up evaluation after cholecystectomy. He is doing well overall. No major complaints. He is eating better. Still remains distended and having some constipation. They have bene using miralax and use some suppositories. He has chronic dysmotility and stays constipation and full of air /distended abdomen.   Review of Systems:  No fever or chills Distended All other review of systems: otherwise negative   Vital Signs:  BP 125/70   Pulse 73   Temp 97.6 F (36.4 C) (Temporal)   Resp 14   Ht 5\' 7"  (1.702 m)   Wt 124 lb (56.2 kg)   SpO2 98%   BMI 19.42 kg/m    Physical Exam:  Physical Exam HENT:     Head: Normocephalic.     Nose: Nose normal.  Pulmonary:     Effort: Pulmonary effort is normal.  Abdominal:     General: There is distension.     Palpations: Abdomen is soft.     Tenderness: There is no abdominal tenderness.     Comments: No hernia, remaining staples removed, incision healed, no signs of drainage or infection, steri strips placed, cholecystomy tube site staples removed, no erythema or drainage, steri strips placed   Neurological:     Mental Status: He is alert.     Assessment:  69 y.o. yo Male s/p cholecystectomy for chronic cholecystitis and cholecystostomy tube in place. Doing great overall.  Plan:  No heavy lifting > 10 lbs, excessive bending, pushing, pulling or squatting until 8 weeks after surgery. Use suppository daily to every other day to help with BM and evacuation of air.  Can remove strips (1/2 next Thursday and 1/2 next Friday) as long as they are pulling up on the edges.  Follow up with GI as scheduled. Follow up with surgery as needed.  We will call IR and tell them you no longer have a drain to do studies on and cancel the 07/06/2019 appt.   Future Appointments  Date Time Provider Department Center  07/06/2019  2:00 PM MC-IR 1 MC-IR [facility]  09/02/2019  2:00  PM McKenzie, 09/04/2019, MD AUR-AUR None  09/29/2019 11:15 AM Rehman, 10/01/2019, MD NRE-NRE None    All of the above recommendations were discussed with the patient and patient's family, and all of patient's and family's questions were answered to their expressed satisfaction.  Joline Maxcy, MD [facility] [address] [place], [place] [phone] (office)

## 2019-06-18 NOTE — Patient Instructions (Addendum)
No heavy lifting > 10 lbs, excessive bending, pushing, pulling or squatting until 8 weeks after surgery. Use suppository daily to every other day to help with BM and evacuation of air.  Can remove strips (1/2 next Thursday and 1/2 next Friday) as long as they are pulling up on the edges.  Follow up with GI as scheduled. Follow up with surgery as needed.  We will call IR and tell them you no longer have a drain to do studies on and cancel the 07/06/2019 appt.

## 2019-07-01 ENCOUNTER — Other Ambulatory Visit: Payer: Self-pay | Admitting: Radiology

## 2019-07-03 ENCOUNTER — Other Ambulatory Visit: Payer: Self-pay

## 2019-07-03 ENCOUNTER — Emergency Department (HOSPITAL_COMMUNITY): Payer: Medicare Other

## 2019-07-03 ENCOUNTER — Inpatient Hospital Stay (HOSPITAL_COMMUNITY)
Admission: EM | Admit: 2019-07-03 | Discharge: 2019-07-08 | DRG: 395 | Disposition: A | Discharge status: Home Health | Payer: Medicare Other | Attending: Family Medicine | Admitting: Family Medicine

## 2019-07-03 ENCOUNTER — Encounter (HOSPITAL_COMMUNITY): Payer: Self-pay | Admitting: Emergency Medicine

## 2019-07-03 DIAGNOSIS — M109 Gout, unspecified: Secondary | ICD-10-CM | POA: Diagnosis not present

## 2019-07-03 DIAGNOSIS — F79 Unspecified intellectual disabilities: Secondary | ICD-10-CM

## 2019-07-03 DIAGNOSIS — I340 Nonrheumatic mitral (valve) insufficiency: Secondary | ICD-10-CM | POA: Diagnosis not present

## 2019-07-03 DIAGNOSIS — Z515 Encounter for palliative care: Secondary | ICD-10-CM

## 2019-07-03 DIAGNOSIS — E785 Hyperlipidemia, unspecified: Secondary | ICD-10-CM | POA: Diagnosis present

## 2019-07-03 DIAGNOSIS — Z66 Do not resuscitate: Secondary | ICD-10-CM | POA: Diagnosis present

## 2019-07-03 DIAGNOSIS — N401 Enlarged prostate with lower urinary tract symptoms: Secondary | ICD-10-CM | POA: Diagnosis present

## 2019-07-03 DIAGNOSIS — R05 Cough: Secondary | ICD-10-CM

## 2019-07-03 DIAGNOSIS — Z4659 Encounter for fitting and adjustment of other gastrointestinal appliance and device: Secondary | ICD-10-CM

## 2019-07-03 DIAGNOSIS — Z79899 Other long term (current) drug therapy: Secondary | ICD-10-CM

## 2019-07-03 DIAGNOSIS — Z7982 Long term (current) use of aspirin: Secondary | ICD-10-CM

## 2019-07-03 DIAGNOSIS — Z87442 Personal history of urinary calculi: Secondary | ICD-10-CM | POA: Diagnosis not present

## 2019-07-03 DIAGNOSIS — E119 Type 2 diabetes mellitus without complications: Secondary | ICD-10-CM

## 2019-07-03 DIAGNOSIS — R140 Abdominal distension (gaseous): Secondary | ICD-10-CM | POA: Diagnosis present

## 2019-07-03 DIAGNOSIS — E11649 Type 2 diabetes mellitus with hypoglycemia without coma: Secondary | ICD-10-CM | POA: Diagnosis not present

## 2019-07-03 DIAGNOSIS — K5909 Other constipation: Secondary | ICD-10-CM | POA: Diagnosis not present

## 2019-07-03 DIAGNOSIS — I10 Essential (primary) hypertension: Secondary | ICD-10-CM | POA: Diagnosis present

## 2019-07-03 DIAGNOSIS — N400 Enlarged prostate without lower urinary tract symptoms: Secondary | ICD-10-CM | POA: Diagnosis present

## 2019-07-03 DIAGNOSIS — R338 Other retention of urine: Secondary | ICD-10-CM | POA: Diagnosis present

## 2019-07-03 DIAGNOSIS — R059 Cough, unspecified: Secondary | ICD-10-CM

## 2019-07-03 DIAGNOSIS — Z452 Encounter for adjustment and management of vascular access device: Secondary | ICD-10-CM

## 2019-07-03 DIAGNOSIS — N3090 Cystitis, unspecified without hematuria: Secondary | ICD-10-CM | POA: Diagnosis not present

## 2019-07-03 DIAGNOSIS — Z87440 Personal history of urinary (tract) infections: Secondary | ICD-10-CM | POA: Diagnosis not present

## 2019-07-03 DIAGNOSIS — K668 Other specified disorders of peritoneum: Secondary | ICD-10-CM | POA: Diagnosis present

## 2019-07-03 DIAGNOSIS — Z7984 Long term (current) use of oral hypoglycemic drugs: Secondary | ICD-10-CM

## 2019-07-03 DIAGNOSIS — Z20822 Contact with and (suspected) exposure to covid-19: Secondary | ICD-10-CM | POA: Diagnosis present

## 2019-07-03 LAB — URINALYSIS, ROUTINE W REFLEX MICROSCOPIC
Bilirubin Urine: NEGATIVE
Glucose, UA: NEGATIVE mg/dL
Ketones, ur: NEGATIVE mg/dL
Nitrite: POSITIVE — AB
Protein, ur: 100 mg/dL — AB
RBC / HPF: >50 RBC/hpf — ABNORMAL HIGH (ref 0–5)
Specific Gravity, Urine: 1.018 (ref 1.005–1.030)
pH: 5 (ref 5.0–8.0)

## 2019-07-03 NOTE — ED Provider Notes (Signed)
[facility] EMERGENCY DEPARTMENT Provider Note   CSN: [id number] Arrival date & time: 07/03/19  2022     History Chief Complaint  Patient presents with  . Catheter Problems    [name] is a 69 y.o. male.  Patient has to be catheterized twice a day by his wife.  She stated when she catheterized him this last time she felt a pop and it started bleeding.  The history is provided by a relative. No language interpreter was used.  Hematuria This is a new problem. The current episode started 1 to 2 hours ago. The problem occurs constantly. The problem has not changed since onset.Pertinent negatives include no chest pain. Nothing aggravates the symptoms. Nothing relieves the symptoms. He has tried nothing for the symptoms.       Past Medical History:  Diagnosis Date  . Diabetes mellitus without complication ([place])   . History of gout   . History of kidney stones   . Hypertension   . Mentally challenged   . Urinary retention     Patient Active Problem List   Diagnosis Date Noted  . Chronic cholecystitis 05/21/2019  . Chronic constipation 03/31/2019  . Cholecystostomy care ([place]) 11/04/2018  . Acute lower UTI 09/18/2018  . Abdominal distention 09/18/2018  . Hypomagnesemia 09/09/2018  . Protein-calorie malnutrition, severe ([place]) 09/09/2018  . Acute blood loss anemia 09/09/2018  . Gangrenous cholecystitis 09/05/2018  . Volvulus ([place]) 09/03/2018  . Ileus ([place])   . Pneumatosis intestinalis   . Gall bladder disease 08/25/2018  . Small bowel ischemia ([place])   . Acute gangrenous cholecystitis 05/24/2018  . SBO (small bowel obstruction) ([place]) 05/24/2018  . Abdominal distension   . Pressure injury of skin 04/09/2018  . C. difficile diarrhea 04/08/2018  . Bilateral hydronephrosis 04/08/2018  . Pneumoperitoneum of unknown etiology   . Diarrhea 04/07/2018  . Nonverbal 04/07/2018  . Hydronephrosis with urinary obstruction due to ureteral calculus 06/20/2016  . Sepsis  secondary to UTI ([place]) 06/20/2016  . Fever 06/19/2016  . Generalized weakness 06/19/2016  . Diabetes mellitus type 2, noninsulin dependent ([place]) 06/19/2016  . Severe sepsis with septic shock ([place]) 06/19/2016  . UTI (urinary tract infection) 06/19/2016  . Hematuria 06/19/2016  . AKI (acute kidney injury) ([place]) 06/19/2016  . Hyponatremia 06/19/2016  . Hypokalemia 06/19/2016  . BPH (benign prostatic hyperplasia) 06/19/2016  . Megacolon 05/07/2014  . Urinary retention 05/07/2014  . Intellectual disability 05/07/2014  . Hyperlipidemia 05/07/2014  . HTN (hypertension) 05/07/2014  . Kidney stone 01/14/2012    Past Surgical History:  Procedure Laterality Date  . CHOLECYSTECTOMY N/A 05/25/2019   Procedure: OPEN CHOLECYSTECTOMY;  Surgeon: Virl Cagey, MD;  Location: [facility];  Service: General;  Laterality: N/A;  . CYSTOSCOPY W/ URETERAL STENT PLACEMENT Bilateral 06/20/2016   Procedure: CYSTOSCOPY WITH RETROGRADE PYELOGRAM/URETERAL STENT PLACEMENT;  Surgeon: Cleon Gustin, MD;  Location: [facility];  Service: Urology;  Laterality: Bilateral;  . CYSTOSCOPY WITH RETROGRADE PYELOGRAM, URETEROSCOPY AND STENT PLACEMENT Bilateral 08/20/2016   Procedure: CYSTOSCOPY WITH BILATERAL RETROGRADE PYELOGRAM, BILATERAL URETEROSCOPY,  LASER LITHOTRIPSY OF RIGHT URETERAL CALCULI, STONE BASKET EXTRACTION LEFT URETERAL CALCULI  AND BILATERAL URETERAL STENT EXCHANGE;  Surgeon: Cleon Gustin, MD;  Location: [facility];  Service: Urology;  Laterality: Bilateral;  . CYSTOSCOPY WITH RETROGRADE PYELOGRAM, URETEROSCOPY AND STENT PLACEMENT Left 01/14/2019   Procedure: CYSTOSCOPY WITH LEFT RETROGRADE PYELOGRAM, LEFT URETEROSCOPY AND LEFT URETERAL STENT PLACEMENT;  Surgeon: Cleon Gustin, MD;  Location: [facility];  Service: Urology;  Laterality: Left;  . CYSTOSCOPY WITH URETHRAL DILATATION  01/14/2019   Procedure: CYSTOSCOPY WITH URETHRAL DILATATION;  Surgeon: Malen Gauze, MD;  Location: [facility];  Service:  Urology;;  . HOLMIUM LASER APPLICATION Bilateral 08/20/2016   Procedure: HOLMIUM LASER APPLICATION;  Surgeon: Malen Gauze, MD;  Location: [facility];  Service: Urology;  Laterality: Bilateral;  . HOLMIUM LASER APPLICATION Left 01/14/2019   Procedure: HOLMIUM LASER APPLICATION;  Surgeon: Malen Gauze, MD;  Location: [facility];  Service: Urology;  Laterality: Left;  . IR CATHETER TUBE CHANGE  07/04/2018  . IR EXCHANGE BILIARY DRAIN  06/09/2018  . IR EXCHANGE BILIARY DRAIN  10/20/2018  . IR EXCHANGE BILIARY DRAIN  01/12/2019  . IR EXCHANGE BILIARY DRAIN  04/07/2019  . IR PERC CHOLECYSTOSTOMY  05/24/2018  . LAPAROTOMY N/A 08/25/2018   Procedure: EXPLORATORY LAPAROTOMY reduction of volvulus;  Surgeon: [name], MD;  Location: [facility];  Service: General;  Laterality: N/A;  . LAPAROTOMY N/A 05/25/2019   Procedure: EXPLORATORY LAPAROTOMY;  Surgeon: [name], MD;  Location: [facility];  Service: General;  Laterality: N/A;       Family History  Problem Relation Age of Onset  . Gallbladder disease Other     Social History   Tobacco Use  . Smoking status: Never Smoker  . Smokeless tobacco: Never Used  Substance Use Topics  . Alcohol use: No  . Drug use: No    Home Medications Prior to Admission medications   Medication Sig Start Date End Date Taking? Authorizing Provider  acetaminophen (TYLENOL) 500 MG tablet Take 1,000 mg by mouth every 6 (six) hours as needed for mild pain or headache.    [provider]  aspirin EC 81 MG tablet Take 1 tablet (81 mg total) by mouth daily with breakfast. 05/29/19   Mariea Clonts, Courage, MD  atorvastatin (LIPITOR) 20 MG tablet Take 20 mg by mouth at bedtime. 06/09/19   [provider]  finasteride (PROSCAR) 5 MG tablet Take 1 tablet (5 mg total) by mouth daily. 05/29/19   Shon Hale, MD  HYDROcodone-acetaminophen (NORCO) 5-325 MG tablet Take 1 tablet by mouth every 6 (six) hours as needed for moderate pain. 05/29/19   Shon Hale, MD  linaclotide Karlene Einstein) 145 MCG CAPS capsule Take 1 capsule (145 mcg total) by mouth daily before breakfast. 05/29/19   Shon Hale, MD  magnesium oxide (MAG-OX) 400 MG tablet Take 1 tablet (400 mg total) by mouth daily. 10/23/18   Sharee Holster, NP  metFORMIN (GLUCOPHAGE) 850 MG tablet Take 1 tablet (850 mg total) by mouth 2 (two) times daily with a meal. 05/29/19 05/28/20  Emokpae, Courage, MD  polyethylene glycol (MIRALAX / GLYCOLAX) 17 g packet Take 17 g by mouth daily. 05/29/19   Shon Hale, MD  Potassium Chloride ER 20 MEQ TBCR Take 20 mEq by mouth every Tuesday, Thursday, Saturday, and Sunday. 1 tab daily by mouth 05/30/19   Emokpae, Courage, MD  senna-docusate (SENOKOT-S) 8.6-50 MG tablet Take 2 tablets by mouth at bedtime. 05/29/19   Shon Hale, MD  simethicone (MYLICON) 40 MG/0.6ML drops Take 1.2 mLs (80 mg total) by mouth 4 (four) times daily. 05/29/19   Shon Hale, MD  tamsulosin (FLOMAX) 0.4 MG CAPS capsule Take 1 capsule (0.4 mg total) by mouth every evening. 05/29/19   Shon Hale, MD    Allergies    Patient has no known allergies.  Review of Systems   Review of Systems  Unable to perform ROS:  Mental status change  Cardiovascular: Negative for chest pain.  Genitourinary: Positive for hematuria.    Physical Exam Updated Vital Signs BP 114/64   Pulse 78   Temp 98.3 F (36.8 C) (Oral)   Resp 16   Ht 5\' 7"  (1.702 m)   Wt 56.2 kg   SpO2 99%   BMI 19.42 kg/m   Physical Exam Vitals and nursing note reviewed.  Constitutional:      Appearance: He is well-developed.  HENT:     Head: Normocephalic.     Nose: Nose normal.  Eyes:     Conjunctiva/sclera: Conjunctivae normal.  Neck:     Trachea: No tracheal deviation.  Cardiovascular:     Rate and Rhythm: Normal rate.     Heart sounds: No murmur.  Pulmonary:     Effort: Pulmonary effort is normal.  Abdominal:     Tenderness: There is no abdominal tenderness.  Genitourinary:     Comments: Bleeding from meatus Musculoskeletal:        General: Normal range of motion.     Cervical back: Normal range of motion.  Skin:    General: Skin is warm.  Neurological:     Mental Status: He is alert.     Comments: Alert but confused     ED Results / Procedures / Treatments   Labs (all labs ordered are listed, but only abnormal results are displayed) Labs Reviewed  URINALYSIS, ROUTINE W REFLEX MICROSCOPIC    EKG None  Radiology No results found.  Procedures Procedures (including critical care time)  Medications Ordered in ED Medications - No data to display  ED Course  I have reviewed the triage vital signs and the nursing notes.  Pertinent labs & imaging results that were available during my care of the patient were reviewed by me and considered in my medical decision making (see chart for details).    MDM Rules/Calculators/A&P                      Patient with hematuria after catheterization.  We would like to leave the catheter in but the patient's wife states that at night he will yank it out.  I feel like this could make the situation worse so we will remove the catheter and have him follow-up with urology Final Clinical Impression(s) / ED Diagnoses Final diagnoses:  None    Rx / DC Orders ED Discharge Orders    None       , MD 07/03/19 2248

## 2019-07-03 NOTE — ED Notes (Signed)
Bladder scan performed with 56 ml present.

## 2019-07-03 NOTE — Discharge Instructions (Addendum)
Follow-up with [facility] next week.  Return if any problems 

## 2019-07-03 NOTE — ED Triage Notes (Addendum)
Pt's legal guardian states she was In and Out cathing the pt when she felt a pop and now he is having bleeding from his penis. She also states that there was less than normal the amount of urine.

## 2019-07-04 ENCOUNTER — Emergency Department (HOSPITAL_COMMUNITY): Payer: Medicare Other

## 2019-07-04 DIAGNOSIS — K5909 Other constipation: Secondary | ICD-10-CM | POA: Diagnosis not present

## 2019-07-04 DIAGNOSIS — F79 Unspecified intellectual disabilities: Secondary | ICD-10-CM | POA: Diagnosis not present

## 2019-07-04 DIAGNOSIS — N138 Other obstructive and reflux uropathy: Secondary | ICD-10-CM

## 2019-07-04 DIAGNOSIS — I10 Essential (primary) hypertension: Secondary | ICD-10-CM | POA: Diagnosis not present

## 2019-07-04 DIAGNOSIS — R140 Abdominal distension (gaseous): Secondary | ICD-10-CM | POA: Diagnosis not present

## 2019-07-04 DIAGNOSIS — E785 Hyperlipidemia, unspecified: Secondary | ICD-10-CM | POA: Diagnosis not present

## 2019-07-04 DIAGNOSIS — E119 Type 2 diabetes mellitus without complications: Secondary | ICD-10-CM

## 2019-07-04 DIAGNOSIS — Z66 Do not resuscitate: Secondary | ICD-10-CM | POA: Diagnosis not present

## 2019-07-04 DIAGNOSIS — Z515 Encounter for palliative care: Secondary | ICD-10-CM | POA: Diagnosis not present

## 2019-07-04 DIAGNOSIS — Z87440 Personal history of urinary (tract) infections: Secondary | ICD-10-CM | POA: Diagnosis not present

## 2019-07-04 DIAGNOSIS — N3090 Cystitis, unspecified without hematuria: Secondary | ICD-10-CM | POA: Diagnosis not present

## 2019-07-04 DIAGNOSIS — K668 Other specified disorders of peritoneum: Secondary | ICD-10-CM | POA: Diagnosis present

## 2019-07-04 DIAGNOSIS — R338 Other retention of urine: Secondary | ICD-10-CM | POA: Diagnosis not present

## 2019-07-04 DIAGNOSIS — Z7984 Long term (current) use of oral hypoglycemic drugs: Secondary | ICD-10-CM | POA: Diagnosis not present

## 2019-07-04 DIAGNOSIS — Z7982 Long term (current) use of aspirin: Secondary | ICD-10-CM | POA: Diagnosis not present

## 2019-07-04 DIAGNOSIS — N401 Enlarged prostate with lower urinary tract symptoms: Secondary | ICD-10-CM

## 2019-07-04 DIAGNOSIS — Z20822 Contact with and (suspected) exposure to covid-19: Secondary | ICD-10-CM | POA: Diagnosis not present

## 2019-07-04 DIAGNOSIS — Z79899 Other long term (current) drug therapy: Secondary | ICD-10-CM | POA: Diagnosis not present

## 2019-07-04 DIAGNOSIS — Z87442 Personal history of urinary calculi: Secondary | ICD-10-CM | POA: Diagnosis not present

## 2019-07-04 DIAGNOSIS — I340 Nonrheumatic mitral (valve) insufficiency: Secondary | ICD-10-CM | POA: Diagnosis not present

## 2019-07-04 DIAGNOSIS — M109 Gout, unspecified: Secondary | ICD-10-CM | POA: Diagnosis not present

## 2019-07-04 DIAGNOSIS — E11649 Type 2 diabetes mellitus with hypoglycemia without coma: Secondary | ICD-10-CM | POA: Diagnosis not present

## 2019-07-04 LAB — COMPREHENSIVE METABOLIC PANEL
ALT: 7 U/L (ref 0–44)
ALT: 8 U/L (ref 0–44)
AST: 10 U/L — ABNORMAL LOW (ref 15–41)
AST: 9 U/L — ABNORMAL LOW (ref 15–41)
Albumin: 2.8 g/dL — ABNORMAL LOW (ref 3.5–5.0)
Albumin: 3.4 g/dL — ABNORMAL LOW (ref 3.5–5.0)
Alkaline Phosphatase: 67 U/L (ref 38–126)
Alkaline Phosphatase: 73 U/L (ref 38–126)
Anion gap: 6 (ref 5–15)
Anion gap: 6 (ref 5–15)
BUN: 12 mg/dL (ref 8–23)
BUN: 17 mg/dL (ref 8–23)
CO2: 27 mmol/L (ref 22–32)
CO2: 28 mmol/L (ref 22–32)
Calcium: 8.4 mg/dL — ABNORMAL LOW (ref 8.9–10.3)
Calcium: 8.9 mg/dL (ref 8.9–10.3)
Chloride: 101 mmol/L (ref 98–111)
Chloride: 104 mmol/L (ref 98–111)
Creatinine, Ser: 0.71 mg/dL (ref 0.61–1.24)
Creatinine, Ser: 0.78 mg/dL (ref 0.61–1.24)
GFR calc Af Amer: >60 mL/min (ref >60)
GFR calc Af Amer: >60 mL/min (ref >60)
GFR calc non Af Amer: >60 mL/min (ref >60)
GFR calc non Af Amer: >60 mL/min (ref >60)
Glucose, Bld: 114 mg/dL — ABNORMAL HIGH (ref 70–99)
Glucose, Bld: 132 mg/dL — ABNORMAL HIGH (ref 70–99)
Potassium: 4.1 mmol/L (ref 3.5–5.1)
Potassium: 4.3 mmol/L (ref 3.5–5.1)
Sodium: 135 mmol/L (ref 135–145)
Sodium: 137 mmol/L (ref 135–145)
Total Bilirubin: 0.3 mg/dL (ref 0.3–1.2)
Total Bilirubin: 0.3 mg/dL (ref 0.3–1.2)
Total Protein: 6.3 g/dL — ABNORMAL LOW (ref 6.5–8.1)
Total Protein: 7.3 g/dL (ref 6.5–8.1)

## 2019-07-04 LAB — CBC WITH DIFFERENTIAL/PLATELET
Abs Immature Granulocytes: 0 10*3/uL (ref 0.00–0.07)
Basophils Absolute: 0 10*3/uL (ref 0.0–0.1)
Basophils Relative: 0 %
Eosinophils Absolute: 0.1 10*3/uL (ref 0.0–0.5)
Eosinophils Relative: 1 %
HCT: 31.4 % — ABNORMAL LOW (ref 39.0–52.0)
Hemoglobin: 9.3 g/dL — ABNORMAL LOW (ref 13.0–17.0)
Immature Granulocytes: 0 %
Lymphocytes Relative: 18 %
Lymphs Abs: 1.3 10*3/uL (ref 0.7–4.0)
MCH: 25.5 pg — ABNORMAL LOW (ref 26.0–34.0)
MCHC: 29.6 g/dL — ABNORMAL LOW (ref 30.0–36.0)
MCV: 86.3 fL (ref 80.0–100.0)
Monocytes Absolute: 0.3 10*3/uL (ref 0.1–1.0)
Monocytes Relative: 5 %
Neutro Abs: 5.3 10*3/uL (ref 1.7–7.7)
Neutrophils Relative %: 76 %
Platelets: 340 10*3/uL (ref 150–400)
RBC: 3.64 MIL/uL — ABNORMAL LOW (ref 4.22–5.81)
RDW: 15.6 % — ABNORMAL HIGH (ref 11.5–15.5)
WBC: 7 10*3/uL (ref 4.0–10.5)
nRBC: 0 % (ref 0.0–0.2)

## 2019-07-04 LAB — CBC
HCT: 28.3 % — ABNORMAL LOW (ref 39.0–52.0)
Hemoglobin: 8.6 g/dL — ABNORMAL LOW (ref 13.0–17.0)
MCH: 26.2 pg (ref 26.0–34.0)
MCHC: 30.4 g/dL (ref 30.0–36.0)
MCV: 86.3 fL (ref 80.0–100.0)
Platelets: 305 10*3/uL (ref 150–400)
RBC: 3.28 MIL/uL — ABNORMAL LOW (ref 4.22–5.81)
RDW: 15.6 % — ABNORMAL HIGH (ref 11.5–15.5)
WBC: 4.4 10*3/uL (ref 4.0–10.5)
nRBC: 0 % (ref 0.0–0.2)

## 2019-07-04 LAB — LIPASE, BLOOD: Lipase: 22 U/L (ref 11–51)

## 2019-07-04 LAB — GLUCOSE, CAPILLARY
Glucose-Capillary: 121 mg/dL — ABNORMAL HIGH (ref 70–99)
Glucose-Capillary: 102 mg/dL — ABNORMAL HIGH (ref 70–99)
Glucose-Capillary: 99 mg/dL (ref 70–99)
Glucose-Capillary: 86 mg/dL (ref 70–99)

## 2019-07-04 LAB — RESPIRATORY PANEL BY RT PCR (FLU A&B, COVID)
Influenza A by PCR: NEGATIVE
Influenza B by PCR: NEGATIVE
SARS Coronavirus 2 by RT PCR: NEGATIVE

## 2019-07-04 LAB — LACTIC ACID, PLASMA: Lactic Acid, Venous: 0.9 mmol/L (ref 0.5–1.9)

## 2019-07-04 MED ORDER — CHLORHEXIDINE GLUCONATE CLOTH 2 % EX PADS
6.0000 | MEDICATED_PAD | Freq: Every day | CUTANEOUS | Status: DC
  Administered 2019-07-05 – 2019-07-08 (×3): 6 via TOPICAL

## 2019-07-04 MED ORDER — PIPERACILLIN-TAZOBACTAM 3.375 G IVPB 30 MIN
3.3750 g | Freq: Once | INTRAVENOUS | Status: AC
  Administered 2019-07-04: 02:00:00 3.375 g via INTRAVENOUS
  Filled 2019-07-04: qty 50

## 2019-07-04 MED ORDER — PIPERACILLIN-TAZOBACTAM 3.375 G IVPB
3.3750 g | Freq: Three times a day (TID) | INTRAVENOUS | Status: DC
  Administered 2019-07-04 – 2019-07-08 (×12): 3.375 g via INTRAVENOUS
  Filled 2019-07-04 (×12): qty 50

## 2019-07-04 MED ORDER — SODIUM CHLORIDE 0.9 % IV SOLN: INTRAVENOUS | Status: DC

## 2019-07-04 MED ORDER — SODIUM CHLORIDE 0.9 % IV BOLUS
1000.0000 mL | Freq: Once | INTRAVENOUS | Status: AC
  Administered 2019-07-04: 1000 mL via INTRAVENOUS

## 2019-07-04 MED ORDER — ONDANSETRON HCL 4 MG PO TABS: 4.0000 mg | ORAL_TABLET | Freq: Four times a day (QID) | ORAL | Status: DC | PRN

## 2019-07-04 MED ORDER — ONDANSETRON HCL 4 MG/2ML IJ SOLN: 4.0000 mg | Freq: Four times a day (QID) | INTRAMUSCULAR | Status: DC | PRN

## 2019-07-04 MED ORDER — IOHEXOL 300 MG/ML  SOLN
75.0000 mL | Freq: Once | INTRAMUSCULAR | Status: AC | PRN
  Administered 2019-07-04: 75 mL via INTRAVENOUS

## 2019-07-04 MED ORDER — SODIUM CHLORIDE 0.9 % IV SOLN
1.0000 g | Freq: Once | INTRAVENOUS | Status: AC
  Administered 2019-07-04: 01:00:00 1 g via INTRAVENOUS
  Filled 2019-07-04: qty 10

## 2019-07-04 NOTE — ED Notes (Signed)
Patient transported to CT 

## 2019-07-04 NOTE — H&P (Signed)
History and Physical    [name] UXN:[id number] DOB: 09/08/1950 DOA: 07/03/2019  PCP: Pearson Grippe, MD  Patient coming from: Home.  I have personally briefly reviewed patient's old medical records in [facility]  Chief Complaint: Bleeding from penis.  HPI: [name] is a 69 y.o. male with medical history significant of type 2 diabetes, gout, urolithiasis, hypertension, MR, urinary retention, admitted on May 23, 2019 due to pneumatosis intestinalis, chronic cholecystitis, megacolon, SBO and pneumoperitoneum of unknown etiology with abdominal distention.  He underwent open cholecystectomy with cholecystostomy tube placement by Dr. Henreitta Leber on 05/25/2019.  He has also had multiple surgical procedures in the past as described below.  He is brought in tonight by his caregiver who noticed hematuria after hearing "a pop" with Foley insertion.  He has been having chronic abdominal distention as well.  There has been no changes in mentation from baseline, fever, nausea, vomiting, melena or hematochezia.  His last bowel movement was yesterday and was loose.  The patient is nonverbal he is unable to provide further information.  ED Course: Initial vital signs were temperature 98.3, pulse 107, respiration 18, blood pressure 125/63 mmHg and O2 sat 99% on room air.  The patient received a 1000 mL NS bolus, ceftriaxone 1 g IVPB, but after possible bowel perforation he was started on Zosyn for prophylaxis.  Dr. Henreitta Leber was contacted and she reviewed the case.  Please see her note for recommendations.  Urinalysis shows that the reviewed appearance with large hemoglobinuria, proteinuria 100 mg/dL, positive nitrates, moderate leukocyte esterase.  Microscopic examination of the urine shows erythrocyte Toria more than 50, leukocyturia of 11-20 and many bacteria.  CBC shows a white count of 7.0, hemoglobin 9.3 g/dL and platelets 254.  Lactic acid and lipase were normal.  CMP shows a glucose of 132 mg/dL,  total protein was 3.4 g/dL and AST of 10 units/L.  All other CMP resulted values are within normal range.  Imaging:  abdominal and chest radiograph showed mild diffuse gaseous distention of bowel with apparent free air under the hemidiaphragms.  CT abdomen/pelvis was obtained confirming these findings along no evidence of cholecystitis, fluid in gallbladder fossa, left nephrolithiasis and bladder wall thickening who could be due to outlet obstruction or cystitis.  There is very low lung volumes with bibasilar atelectasis and vascular congestion.  Please see images and full radiology report for further detail.  Please also see  Dr. Daron Offer notes detailing his conversation with Dr. Kearney Hard, who was the radiologist who read the patient's CT.  Review of Systems: As per HPI otherwise 10 point review of systems negative.  Past Medical History:  Diagnosis Date  . Diabetes mellitus without complication (HCC)   . History of gout   . History of kidney stones   . Hypertension   . Mentally challenged   . Urinary retention     Past Surgical History:  Procedure Laterality Date  . CHOLECYSTECTOMY N/A 05/25/2019   Procedure: OPEN CHOLECYSTECTOMY;  Surgeon: [name], MD;  Location: [facility];  Service: General;  Laterality: N/A;  . CYSTOSCOPY W/ URETERAL STENT PLACEMENT Bilateral 06/20/2016   Procedure: CYSTOSCOPY WITH RETROGRADE PYELOGRAM/URETERAL STENT PLACEMENT;  Surgeon: Malen Gauze, MD;  Location: [facility];  Service: Urology;  Laterality: Bilateral;  . CYSTOSCOPY WITH RETROGRADE PYELOGRAM, URETEROSCOPY AND STENT PLACEMENT Bilateral 08/20/2016   Procedure: CYSTOSCOPY WITH BILATERAL RETROGRADE PYELOGRAM, BILATERAL URETEROSCOPY,  LASER LITHOTRIPSY OF RIGHT URETERAL CALCULI, STONE BASKET EXTRACTION LEFT URETERAL CALCULI  AND BILATERAL  URETERAL STENT EXCHANGE;  Surgeon: Malen GauzeMcKenzie, Patrick L, MD;  Location: [facility];  Service: Urology;  Laterality: Bilateral;  . CYSTOSCOPY WITH RETROGRADE PYELOGRAM,  URETEROSCOPY AND STENT PLACEMENT Left 01/14/2019   Procedure: CYSTOSCOPY WITH LEFT RETROGRADE PYELOGRAM, LEFT URETEROSCOPY AND LEFT URETERAL STENT PLACEMENT;  Surgeon: Malen GauzeMcKenzie, Patrick L, MD;  Location: [facility];  Service: Urology;  Laterality: Left;  . CYSTOSCOPY WITH URETHRAL DILATATION  01/14/2019   Procedure: CYSTOSCOPY WITH URETHRAL DILATATION;  Surgeon: Malen GauzeMcKenzie, Patrick L, MD;  Location: [facility];  Service: Urology;;  . HOLMIUM LASER APPLICATION Bilateral 08/20/2016   Procedure: HOLMIUM LASER APPLICATION;  Surgeon: Malen GauzeMcKenzie, Patrick L, MD;  Location: [facility];  Service: Urology;  Laterality: Bilateral;  . HOLMIUM LASER APPLICATION Left 01/14/2019   Procedure: HOLMIUM LASER APPLICATION;  Surgeon: Malen GauzeMcKenzie, Patrick L, MD;  Location: [facility];  Service: Urology;  Laterality: Left;  . IR CATHETER TUBE CHANGE  07/04/2018  . IR EXCHANGE BILIARY DRAIN  06/09/2018  . IR EXCHANGE BILIARY DRAIN  10/20/2018  . IR EXCHANGE BILIARY DRAIN  01/12/2019  . IR EXCHANGE BILIARY DRAIN  04/07/2019  . IR PERC CHOLECYSTOSTOMY  05/24/2018  . LAPAROTOMY N/A 08/25/2018   Procedure: EXPLORATORY LAPAROTOMY reduction of volvulus;  Surgeon: Lucretia RoersBridges, Lindsay C, MD;  Location: [facility];  Service: General;  Laterality: N/A;  . LAPAROTOMY N/A 05/25/2019   Procedure: EXPLORATORY LAPAROTOMY;  Surgeon: Lucretia RoersBridges, Lindsay C, MD;  Location: [facility];  Service: General;  Laterality: N/A;     reports that he has never smoked. He has never used smokeless tobacco. He reports that he does not drink alcohol or use drugs.  No Known Allergies  Family History  Problem Relation Age of Onset  . Gallbladder disease Other    Prior to Admission medications   Medication Sig Start Date End Date Taking? Authorizing Provider  aspirin EC 81 MG tablet Take 1 tablet (81 mg total) by mouth daily with breakfast. 05/29/19  Yes Emokpae, Courage, MD  atorvastatin (LIPITOR) 20 MG tablet Take 20 mg by mouth at bedtime. 06/09/19  Yes [provider]  finasteride  (PROSCAR) 5 MG tablet Take 1 tablet (5 mg total) by mouth daily. 05/29/19  Yes Emokpae, Courage, MD  HYDROcodone-acetaminophen (NORCO) 5-325 MG tablet Take 1 tablet by mouth every 6 (six) hours as needed for moderate pain. 05/29/19  Yes Shon HaleEmokpae, Courage, MD  linaclotide Karlene Einstein(LINZESS) 145 MCG CAPS capsule Take 1 capsule (145 mcg total) by mouth daily before breakfast. 05/29/19  Yes Emokpae, Courage, MD  magnesium oxide (MAG-OX) 400 MG tablet Take 1 tablet (400 mg total) by mouth daily. 10/23/18  Yes Sharee HolsterGreen, Deborah S, NP  metFORMIN (GLUCOPHAGE) 850 MG tablet Take 1 tablet (850 mg total) by mouth 2 (two) times daily with a meal. 05/29/19 05/28/20 Yes Emokpae, Courage, MD  polyethylene glycol (MIRALAX / GLYCOLAX) 17 g packet Take 17 g by mouth daily. 05/29/19  Yes Shon HaleEmokpae, Courage, MD  Potassium Chloride ER 20 MEQ TBCR Take 20 mEq by mouth every Tuesday, Thursday, Saturday, and Sunday. 1 tab daily by mouth 05/30/19  Yes Emokpae, Courage, MD  senna-docusate (SENOKOT-S) 8.6-50 MG tablet Take 2 tablets by mouth at bedtime. 05/29/19  Yes Emokpae, Courage, MD  simethicone (MYLICON) 40 MG/0.6ML drops Take 1.2 mLs (80 mg total) by mouth 4 (four) times daily. 05/29/19  Yes Emokpae, Courage, MD  tamsulosin (FLOMAX) 0.4 MG CAPS capsule Take 1 capsule (0.4 mg total) by mouth every evening. 05/29/19  Yes Shon HaleEmokpae, Courage, MD  acetaminophen (TYLENOL) 500 MG tablet  Take 1,000 mg by mouth every 6 (six) hours as needed for mild pain or headache.    [provider]    Physical Exam: Vitals:   07/04/19 0036 07/04/19 0100 07/04/19 0115 07/04/19 0245  BP:  (!) 94/59 105/65 (!) 119/57  Pulse: 73 67 76 77  Resp:  16 17 18   Temp:      TempSrc:      SpO2: 100% 100% 90% 98%  Weight:      Height:        Constitutional: NAD, calm, comfortable Eyes: PERRL, lids and conjunctivae normal ENMT: NGT in place.  Mucous membranes are mildly dry. Posterior pharynx clear of any exudate or lesions. Neck: normal, supple, no masses, no  thyromegaly Respiratory: Decreased breath sounds in bases, otherwise clear to auscultation bilaterally, no wheezing, no crackles. Normal respiratory effort. No accessory muscle use.  Cardiovascular: Regular rate and rhythm, no murmurs / rubs / gallops. No extremity edema. 2+ pedal pulses. No carotid bruits.  Abdomen: Distended.  Midline surgical scar, BS positive.  Soft, no tenderness, no guarding or rebound tenderness, no masses palpated. No hepatosplenomegaly. Musculoskeletal: no clubbing / cyanosis.  Good ROM, no contractures. Normal muscle tone.  Skin: no rashes, lesions, ulcers on very limited dermatological examination. Neurologic: CN 2-12 grossly intact. Sensation intact, DTR normal.  Moves all extremities. Psychiatric: Nonverbal, but can answer yes or no questions with signals.  Labs on Admission: I have personally reviewed following labs and imaging studies  CBC: Recent Labs  Lab 07/04/19 0011  WBC 7.0  NEUTROABS 5.3  HGB 9.3*  HCT 31.4*  MCV 86.3  PLT 340   Basic Metabolic Panel: Recent Labs  Lab 07/04/19 0011  NA 135  K 4.3  CL 101  CO2 28  GLUCOSE 132*  BUN 17  CREATININE 0.78  CALCIUM 8.9   GFR: Estimated Creatinine Clearance: 69.3 mL/min (by C-G formula based on SCr of 0.78 mg/dL). Liver Function Tests: Recent Labs  Lab 07/04/19 0011  AST 10*  ALT 8  ALKPHOS 73  BILITOT 0.3  PROT 7.3  ALBUMIN 3.4*   Recent Labs  Lab 07/04/19 0011  LIPASE 22   No results for input(s): AMMONIA in the last 168 hours. Coagulation Profile: No results for input(s): INR, PROTIME in the last 168 hours. Cardiac Enzymes: No results for input(s): CKTOTAL, CKMB, CKMBINDEX, TROPONINI in the last 168 hours. BNP (last 3 results) No results for input(s): PROBNP in the last 8760 hours. HbA1C: No results for input(s): HGBA1C in the last 72 hours. CBG: No results for input(s): GLUCAP in the last 168 hours. Lipid Profile: No results for input(s): CHOL, HDL, LDLCALC, TRIG,  CHOLHDL, LDLDIRECT in the last 72 hours. Thyroid Function Tests: No results for input(s): TSH, T4TOTAL, FREET4, T3FREE, THYROIDAB in the last 72 hours. Anemia Panel: No results for input(s): VITAMINB12, FOLATE, FERRITIN, TIBC, IRON, RETICCTPCT in the last 72 hours. Urine analysis:    Component Value Date/Time   COLORURINE YELLOW 07/03/2019 2240   APPEARANCEUR TURBID (A) 07/03/2019 2240   LABSPEC 1.018 07/03/2019 2240   PHURINE 5.0 07/03/2019 2240   GLUCOSEU NEGATIVE 07/03/2019 2240   HGBUR LARGE (A) 07/03/2019 2240   BILIRUBINUR NEGATIVE 07/03/2019 2240   KETONESUR NEGATIVE 07/03/2019 2240   PROTEINUR 100 (A) 07/03/2019 2240   UROBILINOGEN 1.0 07/20/2014 1718   NITRITE POSITIVE (A) 07/03/2019 2240   LEUKOCYTESUR MODERATE (A) 07/03/2019 2240    Radiological Exams on Admission: CT ABDOMEN PELVIS W CONTRAST  Result Date: 07/04/2019 CLINICAL  DATA:  Abdominal distention. Question free air on plain films EXAM: CT ABDOMEN AND PELVIS WITH CONTRAST TECHNIQUE: Multidetector CT imaging of the abdomen and pelvis was performed using the standard protocol following bolus administration of intravenous contrast. CONTRAST:  74mL OMNIPAQUE IOHEXOL 300 MG/ML  SOLN COMPARISON:  Plain films 07/03/2019.  CT 05/23/2019. FINDINGS: Lower chest: No acute finding Hepatobiliary: Previously seen portal venous gas no longer visualized. Prior cholecystectomy, new since prior study. Small fluid collection in the gallbladder fossa measures 4.2 x 1.6 cm. Pancreas: No focal abnormality or ductal dilatation. Spleen: No focal abnormality.  Normal size. Adrenals/Urinary Tract: Multiple left renal stones again noted. No ureteral stones or hydronephrosis. Urinary bladder wall appears thickened. Foley catheter in place. Adrenal glands unremarkable. Stomach/Bowel: Markedly dilated large and small bowel diffusely. Previously seen pneumatosis no longer visualized. Exact source of free air not identified. Stomach is decompressed.  Vascular/Lymphatic: No evidence of aneurysm or adenopathy. Reproductive: Radiation seeds in the region of the prostate. Other: Moderate pneumoperitoneum noted.  No free fluid. Musculoskeletal: No acute bony abnormality. IMPRESSION: Marked gaseous distention of large and small bowel diffusely with moderate pneumoperitoneum compatible with bowel perforation. Exact source of the free air not definitively seen. Changes of cholecystectomy. Small postoperative fluid collection in the gallbladder fossa measuring up to 4.2 cm. Bladder wall thickening. This could be related to bladder outlet obstruction or cystitis. Left nephrolithiasis.  No hydronephrosis. Critical Value/emergent results were called by telephone at the time of interpretation on 07/04/2019 at 1:58 am to provider [facility] , who verbally acknowledged these results. Electronically Signed   By: Charlett Nose M.D.   On: 07/04/2019 02:01   DG ABD ACUTE 2+V W 1V CHEST  Result Date: 07/04/2019 CLINICAL DATA:  Abdominal distension EXAM: DG ABDOMEN ACUTE W/ 1V CHEST COMPARISON:  CT 05/23/2019 FINDINGS: Low lung volumes with bibasilar atelectasis. No effusions. Vascular congestion. Marked diffuse gaseous distention of bowel. Concern for free air under the hemidiaphragms bilaterally. Prior cholecystectomy. Left nephrolithiasis. IMPRESSION: Marked diffuse gaseous distention of bowel with apparent free air under the hemidiaphragms. Findings concerning for bowel perforation. Very low lung volumes with bibasilar atelectasis and vascular congestion. These results were called by telephone at the time of interpretation on 07/04/2019 at 12:08 am to provider [facility] , who verbally acknowledged these results. Electronically Signed   By: Charlett Nose M.D.   On: 07/04/2019 00:10    EKG: Independently reviewed.   Assessment/Plan Principal Problem:   Pneumoperitoneum Unclear etiology at this time. Observation/telemetry. Keep n.p.o. Continue NGT with  LIS. Normal saline at 125 mL/h. Antiemetics as needed. Currently not in significant pain. Analgesics as needed. Zosyn for perforation prophylaxis. General surgery will evaluate later today.  Active Problems:   Hyperlipidemia Hold atorvastatin 20 mg daily.    HTN (hypertension) Currently n.p.o. with soft BP. Not on antihypertensive. He takes Flomax for BPH. As needed antihypertensives.    Diabetes mellitus type 2, noninsulin dependent (HCC) Hold Metformin. CBG monitoring every 6 hours while n.p.o. Switch to before meals and at bedtime once cleared for oral intake.    BPH (benign prostatic hyperplasia) Hold finasteride. Hold Flomax.    Chronic constipation Currently n.p.o. Holding Linzess, Mag-Ox, Senokot and MiraLAX.    Intellectual disability Supportive care.   DVT prophylaxis: SCDs. Code Status: Full. Family Communication: Disposition Plan: Observation for further evaluation and treatment of pneumoperitoneum. Consults called: Larae Grooms, MD (general surgery). Admission status: Observation/telemetry.   Bobette Mo MD [facility]  If 7PM-7AM, please contact night-coverage  [email or web address]  07/04/2019, 2:58 AM   This document was prepared using Dragon voice recognition software and may contain some unintended transcription errors.

## 2019-07-04 NOTE — Progress Notes (Signed)
ASSUMPTION OF CARE NOTE   07/04/2019 11:57 AM  [name] was seen and examined.  The H&P by the admitting provider, orders, imaging was reviewed.  Please see new orders.  I spoke with Dr. Constance Haw and I spoke with POA at bedside. Pt is now DNR.  I have added a palliative medicine consult and discontinued cardiac monitoring and transferred to med surg.  Will continue to follow.   Vitals:   07/04/19 0315 07/04/19 0402  BP: 111/70 101/61  Pulse: 67 66  Resp: 16 17  Temp:  97.6 F (36.4 C)  SpO2: 96% 99%    Results for orders placed or performed during the hospital encounter of 07/03/19  Respiratory Panel by RT PCR (Flu A&B, Covid) - Nasopharyngeal Swab   Specimen: Nasopharyngeal Swab  Result Value Ref Range   SARS Coronavirus 2 by RT PCR NEGATIVE NEGATIVE   Influenza A by PCR NEGATIVE NEGATIVE   Influenza B by PCR NEGATIVE NEGATIVE  Urinalysis, Routine w reflex microscopic  Result Value Ref Range   Color, Urine YELLOW YELLOW   APPearance TURBID (A) CLEAR   Specific Gravity, Urine 1.018 1.005 - 1.030   pH 5.0 5.0 - 8.0   Glucose, UA NEGATIVE NEGATIVE mg/dL   Hgb urine dipstick LARGE (A) NEGATIVE   Bilirubin Urine NEGATIVE NEGATIVE   Ketones, ur NEGATIVE NEGATIVE mg/dL   Protein, ur 100 (A) NEGATIVE mg/dL   Nitrite POSITIVE (A) NEGATIVE   Leukocytes,Ua MODERATE (A) NEGATIVE   RBC / HPF >50 (H) 0 - 5 RBC/hpf   WBC, UA 11-20 0 - 5 WBC/hpf   Bacteria, UA MANY (A) NONE SEEN   Squamous Epithelial / LPF 6-10 0 - 5   Mucus PRESENT    Non Squamous Epithelial 0-5 (A) NONE SEEN  CBC with Differential/Platelet  Result Value Ref Range   WBC 7.0 4.0 - 10.5 K/uL   RBC 3.64 (L) 4.22 - 5.81 MIL/uL   Hemoglobin 9.3 (L) 13.0 - 17.0 g/dL   HCT 31.4 (L) 39.0 - 52.0 %   MCV 86.3 80.0 - 100.0 fL   MCH 25.5 (L) 26.0 - 34.0 pg   MCHC 29.6 (L) 30.0 - 36.0 g/dL   RDW 15.6 (H) 11.5 - 15.5 %   Platelets 340 150 - 400 K/uL   nRBC 0.0 0.0 - 0.2 %   Neutrophils Relative % 76 %   Neutro Abs  5.3 1.7 - 7.7 K/uL   Lymphocytes Relative 18 %   Lymphs Abs 1.3 0.7 - 4.0 K/uL   Monocytes Relative 5 %   Monocytes Absolute 0.3 0.1 - 1.0 K/uL   Eosinophils Relative 1 %   Eosinophils Absolute 0.1 0.0 - 0.5 K/uL   Basophils Relative 0 %   Basophils Absolute 0.0 0.0 - 0.1 K/uL   Immature Granulocytes 0 %   Abs Immature Granulocytes 0.00 0.00 - 0.07 K/uL  Comprehensive metabolic panel  Result Value Ref Range   Sodium 135 135 - 145 mmol/L   Potassium 4.3 3.5 - 5.1 mmol/L   Chloride 101 98 - 111 mmol/L   CO2 28 22 - 32 mmol/L   Glucose, Bld 132 (H) 70 - 99 mg/dL   BUN 17 8 - 23 mg/dL   Creatinine, Ser 0.78 0.61 - 1.24 mg/dL   Calcium 8.9 8.9 - 10.3 mg/dL   Total Protein 7.3 6.5 - 8.1 g/dL   Albumin 3.4 (L) 3.5 - 5.0 g/dL   AST 10 (L) 15 - 41 U/L   ALT  8 0 - 44 U/L   Alkaline Phosphatase 73 38 - 126 U/L   Total Bilirubin 0.3 0.3 - 1.2 mg/dL   GFR calc non Af Amer >60 >60 mL/min   GFR calc Af Amer >60 >60 mL/min   Anion gap 6 5 - 15  Lipase, blood  Result Value Ref Range   Lipase 22 11 - 51 U/L  Lactic acid, plasma  Result Value Ref Range   Lactic Acid, Venous 0.9 0.5 - 1.9 mmol/L  Comprehensive metabolic panel  Result Value Ref Range   Sodium 137 135 - 145 mmol/L   Potassium 4.1 3.5 - 5.1 mmol/L   Chloride 104 98 - 111 mmol/L   CO2 27 22 - 32 mmol/L   Glucose, Bld 114 (H) 70 - 99 mg/dL   BUN 12 8 - 23 mg/dL   Creatinine, Ser 1.61 0.61 - 1.24 mg/dL   Calcium 8.4 (L) 8.9 - 10.3 mg/dL   Total Protein 6.3 (L) 6.5 - 8.1 g/dL   Albumin 2.8 (L) 3.5 - 5.0 g/dL   AST 9 (L) 15 - 41 U/L   ALT 7 0 - 44 U/L   Alkaline Phosphatase 67 38 - 126 U/L   Total Bilirubin 0.3 0.3 - 1.2 mg/dL   GFR calc non Af Amer >60 >60 mL/min   GFR calc Af Amer >60 >60 mL/min   Anion gap 6 5 - 15  CBC  Result Value Ref Range   WBC 4.4 4.0 - 10.5 K/uL   RBC 3.28 (L) 4.22 - 5.81 MIL/uL   Hemoglobin 8.6 (L) 13.0 - 17.0 g/dL   HCT 09.6 (L) 04.5 - 40.9 %   MCV 86.3 80.0 - 100.0 fL   MCH 26.2 26.0 -  34.0 pg   MCHC 30.4 30.0 - 36.0 g/dL   RDW 81.1 (H) 91.4 - 78.2 %   Platelets 305 150 - 400 K/uL   nRBC 0.0 0.0 - 0.2 %  Glucose, capillary  Result Value Ref Range   Glucose-Capillary 121 (H) 70 - 99 mg/dL  Glucose, capillary  Result Value Ref Range   Glucose-Capillary 102 (H) 70 - 99 mg/dL  Glucose, capillary  Result Value Ref Range   Glucose-Capillary 99 70 - 99 mg/dL    Maryln Manuel, MD [facility]   07/03/2019  9:03 PM How to contact the [facility] Attending or Consulting provider 7A - 7P or covering provider during after hours 7P -7A, for this patient?  1. Check the care team in [facility] and look for a) attending/consulting [facility] provider listed and b) the [facility] team listed 2. Log into [email or web address] and use [place]'s universal password to access. If you do not have the password, please contact the hospital operator. 3. Locate the [facility] provider you are looking for under [facility] and page to a number that you can be directly reached. 4. If you still have difficulty reaching the provider, please page the [facility] (Director on Call) for the Hospitalists listed on amion for assistance.

## 2019-07-04 NOTE — Progress Notes (Signed)
[facility]  Patient came to ED with hematuria and had possibly a pop with a foley. No real complains, had X-ray due to chronic distention and free air noted, CT comes back confirming.  Very complicated patient; I have operated on twice with open procedures once for volvulized bowel and once for chronic cholecystitis with chronic cholecystostomy tube. Has very distended and dilated abdomen from chronic dys motility.   Dr. Manus Gunning reports no guarding or rebound, soft abdomen.  The patient is stable and has normal labs. I am unsure if etiology of the free air. This could be from his chronic dilation and thinning bowel and Maybe release of air. ? Could it be bladder; unsure. Foley should remain in place now.   I need to have an overall discussion with [name] about goals of care and risk. Abdominal wall and muscles very at risk fo r hernia and even evisceration given recent surgery 04/2019, past surgery, and chronic dilated bowel.  Rapid Covid Ng tube NPO IV antibiotics for prophylaxis for perforation  We will likely need to further study patient with possible CT with oral contrast and even possible cystogram.  Maybe ultimately need surgery pending etiology. But currently is stable, without peritoneal signs or significant free fluid   Yisroel Ramming say Henreitta Leber, MD

## 2019-07-04 NOTE — ED Provider Notes (Signed)
Care assumed from Dr. Estell Harpin.  Patient with history of chronic abdominal distention, chronic urinary retention with self-catheterization, recent open cholecystectomy in February here with hematuria after attempted urinary catheter placement at home.  Foley placed in the ED.  Awaiting UA. Caregiver does not want patient to go home with catheter as she feels he will pull them out. Distended abdomen, but soft and not apparently tender. Better than baseline per caregiver [name] at bedside.  UA is concerning for infection. Culture sent.  AAS obtained given abdominal distension to r/o bowel obstruction.  Call from radiology Dr. Kearney Hard.  Possible free air under the right diaphragm on plain film.  Recommends CT scan. This may be artifactual given patient's chronically distended bowel loops.  Discussed with Dr. Henreitta Leber of general surgery who knows patient well.  She agrees this is likely artifactual and agrees with CT scan.  Discussed with Dr. Kearney Hard of radiology.  CT scan confirms moderate amount of pneumoperitoneum of unclear etiology.  Fluid seen in gallbladder fossa but no other free fluid.  Bladder it appears diffusely thickened. Question whether straight catheterization at home could have perforated bladder. Dr. Kearney Hard does not feel that there is a traumatic bladder injury to explain free air. Bladder wall appears thick which can be seen with cystitis and/or bladder outlet obstruction, but no fluid near bladder to suggest perforation. Also unlikely for such a volume of air to come from bladder.  Patient started on IV fluids and IV antibiotics.  Discussed again with Dr. Henreitta Leber.  She will need to speak to patient and his caregiver [name]. He remains clinically stable with normal blood pressure and mental status.  No white count.  No leukocytosis.  No fever.  Normal lactate.   Dr. Henreitta Leber recommends NG tube decompression, IV fluids, IV antibiotics and hospitalist admission. She will evaluate patient  in the morning and decide on potential operative course. May need CT with oral contrast and/or cystogram to r/o bladder injury. Admission d/w Dr. Robb Matar.  CRITICAL CARE Performed by: Glynn Octave Total critical care time: 75 minutes Critical care time was exclusive of separately billable procedures and treating other patients. Critical care was necessary to treat or prevent imminent or life-threatening deterioration. Critical care was time spent personally by me on the following activities: development of treatment plan with patient and/or surrogate as well as nursing, discussions with consultants, evaluation of patient's response to treatment, examination of patient, obtaining history from patient or surrogate, ordering and performing treatments and interventions, ordering and review of laboratory studies, ordering and review of radiographic studies, pulse oximetry and re-evaluation of patient's condition.    Glynn Octave, MD 07/04/19 [phone]

## 2019-07-04 NOTE — Consult Note (Signed)
[facility] Consult  Reason for Consult: Pneumoperitoneum  Referring Physician:  Dr. Manus Gunning (ED), Dr. Laural Benes   Chief Complaint    Catheter Problems      HPI: [name] is a 69 y.o. male well known to me with a complicated history and intellectual disabilities. He has had a complicated year where he developed severe septic shock from cholecystitis in January 2020 and had a cholecystostomy tube placed, recovered from this, and then had volvulized bowel and portal venous gas May 2020 that I took him back for an Ex lap and reduction. After this surgery, he had a prolonged hospital course and SNF course going back and forth from the SNF for weeks due to a chronically dilated abdomen due to dysmotility and dilated large and small intestine that appears very concerning and disturbing to people who do not know him.  He was able to get home and recover with his caregiver, [name], and ultimately underwent a Ex lap and cholecystectomy 05/2019 after coming back to the ED and having abdominal pain, distention, and concern for pneumatosis. He was explored and everything looked healthy and his gallbladder was removed. I took his staples out in a delayed fashion due to his chronic distention for fear of hernia/ evisceration.    He has been at home and doing well. [name] reports some decreased appetite and distention at times but nothing concerning, and he has been having bowel function.  She brought him to the ED due hematuria after an I&O catheter with a audible pop and blood around the catheter. He was worked up and had a foley placed and an Xray due to this chronic distention. This demonstrated free air, and a CT was obtained last night to confirm this.    I spoke with Dr. Manus Gunning last night and the patient was completely stable, normal labs, and normal exam aside from the distention which is chronic.  I asked for NG placement and bowel rest given no fluid and no peritoneal signs. I  assume that this was a microperforation that released air and no fluid.  His bladder is intact and there is no fluid in the pelvis which goes against any bladder injury, and I would not suspect free air of this magnitude from the bladder.  Currently the patient is resting in bed and looks comfortable. [name] is in the room and we talked.   Past Medical History:  Diagnosis Date  . Diabetes mellitus without complication (HCC)   . History of gout   . History of kidney stones   . Hypertension   . Mentally challenged   . Urinary retention     Past Surgical History:  Procedure Laterality Date  . CHOLECYSTECTOMY N/A 05/25/2019   Procedure: OPEN CHOLECYSTECTOMY;  Surgeon: [name], MD;  Location: [facility];  Service: General;  Laterality: N/A;  . CYSTOSCOPY W/ URETERAL STENT PLACEMENT Bilateral 06/20/2016   Procedure: CYSTOSCOPY WITH RETROGRADE PYELOGRAM/URETERAL STENT PLACEMENT;  Surgeon: Malen Gauze, MD;  Location: [facility];  Service: Urology;  Laterality: Bilateral;  . CYSTOSCOPY WITH RETROGRADE PYELOGRAM, URETEROSCOPY AND STENT PLACEMENT Bilateral 08/20/2016   Procedure: CYSTOSCOPY WITH BILATERAL RETROGRADE PYELOGRAM, BILATERAL URETEROSCOPY,  LASER LITHOTRIPSY OF RIGHT URETERAL CALCULI, STONE BASKET EXTRACTION LEFT URETERAL CALCULI  AND BILATERAL URETERAL STENT EXCHANGE;  Surgeon: Malen Gauze, MD;  Location: [facility];  Service: Urology;  Laterality: Bilateral;  . CYSTOSCOPY WITH RETROGRADE PYELOGRAM, URETEROSCOPY AND STENT PLACEMENT Left 01/14/2019   Procedure: CYSTOSCOPY WITH LEFT RETROGRADE PYELOGRAM,  LEFT URETEROSCOPY AND LEFT URETERAL STENT PLACEMENT;  Surgeon: Malen Gauze, MD;  Location: [facility];  Service: Urology;  Laterality: Left;  . CYSTOSCOPY WITH URETHRAL DILATATION  01/14/2019   Procedure: CYSTOSCOPY WITH URETHRAL DILATATION;  Surgeon: Malen Gauze, MD;  Location: [facility];  Service: Urology;;  . HOLMIUM LASER APPLICATION Bilateral 08/20/2016   Procedure:  HOLMIUM LASER APPLICATION;  Surgeon: Malen Gauze, MD;  Location: [facility];  Service: Urology;  Laterality: Bilateral;  . HOLMIUM LASER APPLICATION Left 01/14/2019   Procedure: HOLMIUM LASER APPLICATION;  Surgeon: Malen Gauze, MD;  Location: [facility];  Service: Urology;  Laterality: Left;  . IR CATHETER TUBE CHANGE  07/04/2018  . IR EXCHANGE BILIARY DRAIN  06/09/2018  . IR EXCHANGE BILIARY DRAIN  10/20/2018  . IR EXCHANGE BILIARY DRAIN  01/12/2019  . IR EXCHANGE BILIARY DRAIN  04/07/2019  . IR PERC CHOLECYSTOSTOMY  05/24/2018  . LAPAROTOMY N/A 08/25/2018   Procedure: EXPLORATORY LAPAROTOMY reduction of volvulus;  Surgeon: [name], MD;  Location: [facility];  Service: General;  Laterality: N/A;  . LAPAROTOMY N/A 05/25/2019   Procedure: EXPLORATORY LAPAROTOMY;  Surgeon: [name], MD;  Location: [facility];  Service: General;  Laterality: N/A;    Family History  Problem Relation Age of Onset  . Gallbladder disease Other     Social History   Tobacco Use  . Smoking status: Never Smoker  . Smokeless tobacco: Never Used  Substance Use Topics  . Alcohol use: No  . Drug use: No    Medications: I have reviewed the patient's current medications. Current Facility-Administered Medications  Medication Dose Route Frequency Provider Last Rate Last Admin  . 0.9 %  sodium chloride infusion   Intravenous Continuous Bobette Mo, MD 125 mL/hr at 07/04/19 0436 New Bag at 07/04/19 0436  . ondansetron (ZOFRAN) tablet 4 mg  4 mg Oral Q6H PRN Bobette Mo, MD       Or  . ondansetron [facility]) injection 4 mg  4 mg Intravenous Q6H PRN Bobette Mo, MD      . piperacillin-tazobactam (ZOSYN) IVPB 3.375 g  3.375 g Intravenous Q8H Bobette Mo, MD 12.5 mL/hr at 07/04/19 1040 3.375 g at 07/04/19 1040    No Known Allergies   ROS:  Review of systems not obtained due to patient factors.  Distention, hematuria with foley   Blood pressure 101/61, pulse 66,  temperature 97.6 F (36.4 C), temperature source Oral, resp. rate 17, height  (1.702 m), weight 56.2 kg, SpO2 99 %. Physical Exam Vitals reviewed.  Constitutional:      Appearance: Normal appearance.  HENT:     Head: Normocephalic and atraumatic.     Nose: Nose normal.     Mouth/Throat:     Mouth: Mucous membranes are moist.  Eyes:     Extraocular Movements: Extraocular movements intact.     Pupils: Pupils are equal, round, and reactive to light.  Cardiovascular:     Rate and Rhythm: Normal rate and regular rhythm.  Pulmonary:     Effort: Pulmonary effort is normal.  Abdominal:     General: There is distension.     Palpations: Abdomen is soft.     Tenderness: There is no abdominal tenderness. There is no guarding or rebound.     Hernia: No hernia is present.  Musculoskeletal:        General: Normal range of motion.     Cervical back: Normal range of motion.  Skin:    General: Skin is warm.  Neurological:     General: No focal deficit present.     Mental Status: He is alert. Mental status is at baseline.  Psychiatric:        Mood and Affect: Mood normal.        Behavior: Behavior normal.     Results: Results for orders placed or performed during the hospital encounter of 07/03/19 (from the past 48 hour(s))  Urinalysis, Routine w reflex microscopic     Status: Abnormal   Collection Time: 07/03/19 10:40 PM  Result Value Ref Range   Color, Urine YELLOW YELLOW   APPearance TURBID (A) CLEAR   Specific Gravity, Urine 1.018 1.005 - 1.030   pH 5.0 5.0 - 8.0   Glucose, UA NEGATIVE NEGATIVE mg/dL   Hgb urine dipstick LARGE (A) NEGATIVE   Bilirubin Urine NEGATIVE NEGATIVE   Ketones, ur NEGATIVE NEGATIVE mg/dL   Protein, ur 409 (A) NEGATIVE mg/dL   Nitrite POSITIVE (A) NEGATIVE   Leukocytes,Ua MODERATE (A) NEGATIVE   RBC / HPF >50 (H) 0 - 5 RBC/hpf   WBC, UA 11-20 0 - 5 WBC/hpf   Bacteria, UA MANY (A) NONE SEEN   Squamous Epithelial / LPF 6-10 0 - 5   Mucus PRESENT     Non Squamous Epithelial 0-5 (A) NONE SEEN    Comment: Performed at [facility], [address] [place], [place] [address]  CBC with Differential/Platelet     Status: Abnormal   Collection Time: 07/04/19 12:11 AM  Result Value Ref Range   WBC 7.0 4.0 - 10.5 K/uL   RBC 3.64 (L) 4.22 - 5.81 MIL/uL   Hemoglobin 9.3 (L) 13.0 - 17.0 g/dL   HCT 47.8 (L) 29.5 - 62.1 %   MCV 86.3 80.0 - 100.0 fL   MCH 25.5 (L) 26.0 - 34.0 pg   MCHC 29.6 (L) 30.0 - 36.0 g/dL   RDW 30.8 (H) 65.7 - 84.6 %   Platelets 340 150 - 400 K/uL   nRBC 0.0 0.0 - 0.2 %   Neutrophils Relative % 76 %   Neutro Abs 5.3 1.7 - 7.7 K/uL   Lymphocytes Relative 18 %   Lymphs Abs 1.3 0.7 - 4.0 K/uL   Monocytes Relative 5 %   Monocytes Absolute 0.3 0.1 - 1.0 K/uL   Eosinophils Relative 1 %   Eosinophils Absolute 0.1 0.0 - 0.5 K/uL   Basophils Relative 0 %   Basophils Absolute 0.0 0.0 - 0.1 K/uL   Immature Granulocytes 0 %   Abs Immature Granulocytes 0.00 0.00 - 0.07 K/uL    Comment: Performed at [facility], [address], [place], [place] [address]  Comprehensive metabolic panel     Status: Abnormal   Collection Time: 07/04/19 12:11 AM  Result Value Ref Range   Sodium 135 135 - 145 mmol/L   Potassium 4.3 3.5 - 5.1 mmol/L   Chloride 101 98 - 111 mmol/L   CO2 28 22 - 32 mmol/L   Glucose, Bld 132 (H) 70 - 99 mg/dL    Comment: Glucose reference range applies only to samples taken after fasting for at least 8 hours.   BUN 17 8 - 23 mg/dL   Creatinine, Ser 2.84 0.61 - 1.24 mg/dL   Calcium 8.9 8.9 - 13.2 mg/dL   Total Protein 7.3 6.5 - 8.1 g/dL   Albumin 3.4 (L) 3.5 - 5.0 g/dL   AST 10 (L) 15 - 41 U/L   ALT  8 0 - 44 U/L   Alkaline Phosphatase 73 38 - 126 U/L   Total Bilirubin 0.3 0.3 - 1.2 mg/dL   GFR calc non Af Amer >60 >60 mL/min   GFR calc Af Amer >60 >60 mL/min   Anion gap 6 5 - 15    Comment: Performed at [facility], [address], [place], [place] [address]  Lipase, blood     Status: None   Collection Time:  07/04/19 12:11 AM  Result Value Ref Range   Lipase 22 11 - 51 U/L    Comment: Performed at [facility], [address], [place], [place] [address]  Lactic acid, plasma     Status: None   Collection Time: 07/04/19 12:11 AM  Result Value Ref Range   Lactic Acid, Venous 0.9 0.5 - 1.9 mmol/L    Comment: Performed at [facility], [address], [place], [place] [address]  Respiratory Panel by RT PCR (Flu A&B, Covid) - Nasopharyngeal Swab     Status: None   Collection Time: 07/04/19  2:09 AM   Specimen: Nasopharyngeal Swab  Result Value Ref Range   SARS Coronavirus 2 by RT PCR NEGATIVE NEGATIVE    Comment: (NOTE) SARS-CoV-2 target nucleic acids are NOT DETECTED. The SARS-CoV-2 RNA is generally detectable in upper respiratoy specimens during the acute phase of infection. The lowest concentration of SARS-CoV-2 viral copies this assay can detect is 131 copies/mL. A negative result does not preclude SARS-Cov-2 infection and should not be used as the sole basis for treatment or other patient management decisions. A negative result may occur with  improper specimen collection/handling, submission of specimen other than nasopharyngeal swab, presence of viral mutation(s) within the areas targeted by this assay, and inadequate number of viral copies (<131 copies/mL). A negative result must be combined with clinical observations, patient history, and epidemiological information. The expected result is Negative. Fact Sheet for Patients:  https://www.moore.com/ Fact Sheet for Healthcare Providers:  https://www.young.biz/ This test is not yet ap proved or cleared by the [place] FDA and  has been authorized for detection and/or diagnosis of SARS-CoV-2 by FDA under an Emergency Use Authorization (EUA). This EUA will remain  in effect (meaning this test can be used) for the duration of the COVID-19 declaration under Section 564(b)(1) of the Act, 21  U.S.C. section 360bbb-3(b)(1), unless the authorization is terminated or revoked sooner.    Influenza A by PCR NEGATIVE NEGATIVE   Influenza B by PCR NEGATIVE NEGATIVE    Comment: (NOTE) The Xpert Xpress SARS-CoV-2/FLU/RSV assay is intended as an aid in  the diagnosis of influenza from Nasopharyngeal swab specimens and  should not be used as a sole basis for treatment. Nasal washings and  aspirates are unacceptable for Xpert Xpress SARS-CoV-2/FLU/RSV  testing. Fact Sheet for Patients: https://www.moore.com/ Fact Sheet for Healthcare Providers: https://www.young.biz/ This test is not yet approved or cleared by the [place] FDA and  has been authorized for detection and/or diagnosis of SARS-CoV-2 by  FDA under an Emergency Use Authorization (EUA). This EUA will remain  in effect (meaning this test can be used) for the duration of the  Covid-19 declaration under Section 564(b)(1) of the Act, 21  U.S.C. section 360bbb-3(b)(1), unless the authorization is  terminated or revoked. Performed at [facility], [address] [place], [place] [address]   Glucose, capillary     Status: Abnormal   Collection Time: 07/04/19  6:24 AM  Result Value Ref Range   Glucose-Capillary 121 (H) 70 -  99 mg/dL    Comment: Glucose reference range applies only to samples taken after fasting for at least 8 hours.  Glucose, capillary     Status: Abnormal   Collection Time: 07/04/19  7:36 AM  Result Value Ref Range   Glucose-Capillary 102 (H) 70 - 99 mg/dL    Comment: Glucose reference range applies only to samples taken after fasting for at least 8 hours.  Comprehensive metabolic panel     Status: Abnormal   Collection Time: 07/04/19 10:02 AM  Result Value Ref Range   Sodium 137 135 - 145 mmol/L   Potassium 4.1 3.5 - 5.1 mmol/L   Chloride 104 98 - 111 mmol/L   CO2 27 22 - 32 mmol/L   Glucose, Bld 114 (H) 70 - 99 mg/dL    Comment: Glucose reference range applies  only to samples taken after fasting for at least 8 hours.   BUN 12 8 - 23 mg/dL   Creatinine, Ser 4.090.71 0.61 - 1.24 mg/dL   Calcium 8.4 (L) 8.9 - 10.3 mg/dL   Total Protein 6.3 (L) 6.5 - 8.1 g/dL   Albumin 2.8 (L) 3.5 - 5.0 g/dL   AST 9 (L) 15 - 41 U/L   ALT 7 0 - 44 U/L   Alkaline Phosphatase 67 38 - 126 U/L   Total Bilirubin 0.3 0.3 - 1.2 mg/dL   GFR calc non Af Amer >60 >60 mL/min   GFR calc Af Amer >60 >60 mL/min   Anion gap 6 5 - 15    Comment: Performed at [facility], [address] [place], [place] [id number]  CBC     Status: Abnormal   Collection Time: 07/04/19 10:02 AM  Result Value Ref Range   WBC 4.4 4.0 - 10.5 K/uL   RBC 3.28 (L) 4.22 - 5.81 MIL/uL   Hemoglobin 8.6 (L) 13.0 - 17.0 g/dL   HCT 47.828.3 (L) 29.539.0 - 62.152.0 %   MCV 86.3 80.0 - 100.0 fL   MCH 26.2 26.0 - 34.0 pg   MCHC 30.4 30.0 - 36.0 g/dL   RDW 30.815.6 (H) 65.711.5 - 84.615.5 %   Platelets 305 150 - 400 K/uL   nRBC 0.0 0.0 - 0.2 %    Comment: Performed at [facility], [address] [place], [place] [address]  Glucose, capillary     Status: None   Collection Time: 07/04/19 11:06 AM  Result Value Ref Range   Glucose-Capillary 99 70 - 99 mg/dL    Comment: Glucose reference range applies only to samples taken after fasting for at least 8 hours.   Personally reviewed imaging and significant anterior free air but no fluid or source noted, gallbladder removed and small collection post operative, bladder intact and no fluid around it, foley in place  CT ABDOMEN PELVIS W CONTRAST  Result Date: 07/04/2019 CLINICAL DATA:  Abdominal distention. Question free air on plain films EXAM: CT ABDOMEN AND PELVIS WITH CONTRAST TECHNIQUE: Multidetector CT imaging of the abdomen and pelvis was performed using the standard protocol following bolus administration of intravenous contrast. CONTRAST:  75mL OMNIPAQUE IOHEXOL 300 MG/ML  SOLN COMPARISON:  Plain films 07/03/2019.  CT 05/23/2019. FINDINGS: Lower chest: No acute finding Hepatobiliary:  Previously seen portal venous gas no longer visualized. Prior cholecystectomy, new since prior study. Small fluid collection in the gallbladder fossa measures 4.2 x 1.6 cm. Pancreas: No focal abnormality or ductal dilatation. Spleen: No focal abnormality.  Normal size. Adrenals/Urinary Tract: Multiple left renal stones again noted. No  ureteral stones or hydronephrosis. Urinary bladder wall appears thickened. Foley catheter in place. Adrenal glands unremarkable. Stomach/Bowel: Markedly dilated large and small bowel diffusely. Previously seen pneumatosis no longer visualized. Exact source of free air not identified. Stomach is decompressed. Vascular/Lymphatic: No evidence of aneurysm or adenopathy. Reproductive: Radiation seeds in the region of the prostate. Other: Moderate pneumoperitoneum noted.  No free fluid. Musculoskeletal: No acute bony abnormality. IMPRESSION: Marked gaseous distention of large and small bowel diffusely with moderate pneumoperitoneum compatible with bowel perforation. Exact source of the free air not definitively seen. Changes of cholecystectomy. Small postoperative fluid collection in the gallbladder fossa measuring up to 4.2 cm. Bladder wall thickening. This could be related to bladder outlet obstruction or cystitis. Left nephrolithiasis.  No hydronephrosis. Critical Value/emergent results were called by telephone at the time of interpretation on 07/04/2019 at 1:58 am to provider [facility] , who verbally acknowledged these results. Electronically Signed   By: Charlett Nose M.D.   On: 07/04/2019 02:01   DG Chest Portable 1 View  Result Date: 07/04/2019 CLINICAL DATA:  NG tube placement EXAM: PORTABLE CHEST 1 VIEW COMPARISON:  Radiograph 05/23/2019, same-day CT FINDINGS: Lung volumes are diminished with streaky basilar areas of atelectasis. Cardiomegaly is similar to prior with a calcified aorta. There is a large volume of subdiaphragmatic free air resulting in a Rigler sign outlining  much of the air distended loops of bowel as well. The transesophageal tube tip terminates at the level of the gastric antrum/duodenal bulb, well beyond the GE junction. Surgical clips in the right upper quadrant likely reflect prior cholecystectomy. IMPRESSION: Transesophageal tube tip terminates near the level of the gastric antrum/duodenal bulb. Large volume of subdiaphragmatic free air resulting in a Rigler sign outlining numerous air distended loops of bowel compatible with findings on comparison CT. Electronically Signed   By: Kreg Shropshire M.D.   On: 07/04/2019 03:14   DG ABD ACUTE 2+V W 1V CHEST  Result Date: 07/04/2019 CLINICAL DATA:  Abdominal distension EXAM: DG ABDOMEN ACUTE W/ 1V CHEST COMPARISON:  CT 05/23/2019 FINDINGS: Low lung volumes with bibasilar atelectasis. No effusions. Vascular congestion. Marked diffuse gaseous distention of bowel. Concern for free air under the hemidiaphragms bilaterally. Prior cholecystectomy. Left nephrolithiasis. IMPRESSION: Marked diffuse gaseous distention of bowel with apparent free air under the hemidiaphragms. Findings concerning for bowel perforation. Very low lung volumes with bibasilar atelectasis and vascular congestion. These results were called by telephone at the time of interpretation on 07/04/2019 at 12:08 am to provider [facility] , who verbally acknowledged these results. Electronically Signed   By: Charlett Nose M.D.   On: 07/04/2019 00:10     Assessment & Plan:  NINA HOAR is a 69 y.o. male with pneumoperitoneum of unknown etiology, unknown acuteness/ presentation as the patient is stable and has no pain on exam.  I would estimate that this is likely secondary to the chronic bowel distention and a microperforation from the thin bowel wall that has sealed but this cannot be fully known .    I have had a long conversation with [name] about goals of care and about the long year and multiple surgeries and hospitalizations. We have discussed  that [name] is getting weaker and more frail and we discussed that any surgery on him would be very risky and given his bowel and his abdominal wall and risk of injury to the bowel and inability to heal the incision/ eviscerate/ hernia formation. We discussed goals of care and if she would  want chest compresses or breathing tube as he could deteriorate over the next 24-72 hours. I do not think this will occur but it could. We also discussed whether we should do surgery or not as this would also cause significant morbidity to the patient and risk.  [name] has been Markis's caregiver for a long time and I have had the privilege of seeing her care and love for him for the last year. She wants to make him a DNR and does not want to pursue any further surgery even if he were to deteriorate. If he deteriorated she would want him to be made comfort care.   -NPO, NG in place -Will plan for CT with oral contrast Monday  -Palliative care consult  -DNR status now, no plans for surgery and if condition worsens, comfort care measures   Updated Dr. Wynetta Emery. Levi Haw, RN, was in room and heard Ms. [name] agree to the above.   All questions were answered to the satisfaction of [name].     Virl Cagey 07/04/2019, 11:15 AM

## 2019-07-04 NOTE — Progress Notes (Signed)
Pharmacy Antibiotic Note  [name] is a 69 y.o. male admitted on 07/03/2019 with intra-abd infection.  Pharmacy has been consulted for Zosyn dosing.  Plan: Zosyn 3.375gm IV q8h Will f/u SCr, micro data, and pt's clinical condition  Height: 5\' 7"  (170.2 cm) Weight: 124 lb (56.2 kg) IBW/kg (Calculated) : 66.1  Temp (24hrs), Avg:98.3 F (36.8 C), Min:98.3 F (36.8 C), Max:98.3 F (36.8 C)  Recent Labs  Lab 07/04/19 0011  WBC 7.0  CREATININE 0.78  LATICACIDVEN 0.9    Estimated Creatinine Clearance: 69.3 mL/min (by C-G formula based on SCr of 0.78 mg/dL).    No Known Allergies  Antimicrobials this admission: 3/20 Rocephin x 1 3/20 Zosyn >>   Microbiology results: 3/19 UCx:     Thank you for allowing pharmacy to be a part of this patient's care.  4/19, PharmD, BCPS Please see amion for complete clinical pharmacist phone list 07/04/2019 2:06 AM

## 2019-07-05 ENCOUNTER — Inpatient Hospital Stay (HOSPITAL_COMMUNITY): Payer: Medicare Other

## 2019-07-05 DIAGNOSIS — K5909 Other constipation: Secondary | ICD-10-CM | POA: Diagnosis not present

## 2019-07-05 DIAGNOSIS — E119 Type 2 diabetes mellitus without complications: Secondary | ICD-10-CM | POA: Diagnosis not present

## 2019-07-05 DIAGNOSIS — K668 Other specified disorders of peritoneum: Secondary | ICD-10-CM | POA: Diagnosis not present

## 2019-07-05 DIAGNOSIS — N401 Enlarged prostate with lower urinary tract symptoms: Secondary | ICD-10-CM | POA: Diagnosis not present

## 2019-07-05 LAB — GLUCOSE, CAPILLARY
Glucose-Capillary: 100 mg/dL — ABNORMAL HIGH (ref 70–99)
Glucose-Capillary: 101 mg/dL — ABNORMAL HIGH (ref 70–99)
Glucose-Capillary: 119 mg/dL — ABNORMAL HIGH (ref 70–99)
Glucose-Capillary: 131 mg/dL — ABNORMAL HIGH (ref 70–99)
Glucose-Capillary: 64 mg/dL — ABNORMAL LOW (ref 70–99)
Glucose-Capillary: 72 mg/dL (ref 70–99)

## 2019-07-05 LAB — COMPREHENSIVE METABOLIC PANEL
ALT: 8 U/L (ref 0–44)
AST: 8 U/L — ABNORMAL LOW (ref 15–41)
Albumin: 2.8 g/dL — ABNORMAL LOW (ref 3.5–5.0)
Alkaline Phosphatase: 63 U/L (ref 38–126)
Anion gap: 12 (ref 5–15)
BUN: 10 mg/dL (ref 8–23)
CO2: 25 mmol/L (ref 22–32)
Calcium: 8.4 mg/dL — ABNORMAL LOW (ref 8.9–10.3)
Chloride: 104 mmol/L (ref 98–111)
Creatinine, Ser: 0.85 mg/dL (ref 0.61–1.24)
GFR calc Af Amer: >60 mL/min (ref >60)
GFR calc non Af Amer: >60 mL/min (ref >60)
Glucose, Bld: 73 mg/dL (ref 70–99)
Potassium: 3.6 mmol/L (ref 3.5–5.1)
Sodium: 141 mmol/L (ref 135–145)
Total Bilirubin: 0.7 mg/dL (ref 0.3–1.2)
Total Protein: 6 g/dL — ABNORMAL LOW (ref 6.5–8.1)

## 2019-07-05 LAB — C DIFFICILE QUICK SCREEN W PCR REFLEX
C Diff antigen: NEGATIVE
C Diff interpretation: NOT DETECTED
C Diff toxin: NEGATIVE

## 2019-07-05 LAB — CBC
HCT: 30.2 % — ABNORMAL LOW (ref 39.0–52.0)
Hemoglobin: 9 g/dL — ABNORMAL LOW (ref 13.0–17.0)
MCH: 25.7 pg — ABNORMAL LOW (ref 26.0–34.0)
MCHC: 29.8 g/dL — ABNORMAL LOW (ref 30.0–36.0)
MCV: 86.3 fL (ref 80.0–100.0)
Platelets: 346 10*3/uL (ref 150–400)
RBC: 3.5 MIL/uL — ABNORMAL LOW (ref 4.22–5.81)
RDW: 15.8 % — ABNORMAL HIGH (ref 11.5–15.5)
WBC: 6 10*3/uL (ref 4.0–10.5)
nRBC: 0 % (ref 0.0–0.2)

## 2019-07-05 LAB — HIV ANTIBODY (ROUTINE TESTING W REFLEX): HIV Screen 4th Generation wRfx: NONREACTIVE

## 2019-07-05 LAB — MAGNESIUM: Magnesium: 1.6 mg/dL — ABNORMAL LOW (ref 1.7–2.4)

## 2019-07-05 MED ORDER — MAGNESIUM SULFATE 4 GM/100ML IV SOLN
4.0000 g | Freq: Once | INTRAVENOUS | Status: AC
  Administered 2019-07-05: 4 g via INTRAVENOUS
  Filled 2019-07-05: qty 100

## 2019-07-05 MED ORDER — DEXTROSE-NACL 5-0.9 % IV SOLN: INTRAVENOUS | Status: DC

## 2019-07-05 MED ORDER — ALBUMIN HUMAN 25 % IV SOLN
INTRAVENOUS | Status: AC
  Filled 2019-07-05: qty 50

## 2019-07-05 MED ORDER — DEXTROSE 50 % IV SOLN
12.5000 g | INTRAVENOUS | Status: AC
  Administered 2019-07-05: 06:00:00 12.5 g via INTRAVENOUS
  Filled 2019-07-05: qty 50

## 2019-07-05 NOTE — Progress Notes (Addendum)
[facility] Progress Note     Subjective: No changes. No complaints. Pulled out NG and it is replaced. Output recorded 3L.   ROS- unable to obtain due to intellectual disability, but remains distended, having BMs   Objective: Vital signs in last 24 hours: Temp:  [97.9 F (36.6 C)-98.4 F (36.9 C)] 97.9 F (36.6 C) (03/21 0538) Pulse Rate:  [78-93] 93 (03/21 0538) Resp:  [16] 16 (03/21 0538) BP: (103-111)/(57) 103/57 (03/21 0538) SpO2:  [97 %-99 %] 97 % (03/21 0538) Last BM Date: 07/05/19  Intake/Output from previous day: 03/20 0701 - 03/21 0700 In: -  Out: 2950 [Emesis/NG output:2950] Intake/Output this shift: No intake/output data recorded.  General appearance: alert and no distress GI: soft, distended/ tympanic- normal baseline, no tenderness Extremities: extremities normal, atraumatic, no cyanosis or edema  Lab Results:  Recent Labs    07/04/19 1002 07/05/19 0428  WBC 4.4 6.0  HGB 8.6* 9.0*  HCT 28.3* 30.2*  PLT 305 346   BMET Recent Labs    07/04/19 1002 07/05/19 0428  NA 137 141  K 4.1 3.6  CL 104 104  CO2 27 25  GLUCOSE 114* 73  BUN 12 10  CREATININE 0.71 0.85  CALCIUM 8.4* 8.4*   PT/INR No results for input(s): LABPROT, INR in the last 72 hours.  Studies/Results: CT ABDOMEN PELVIS W CONTRAST  Result Date: 07/04/2019 CLINICAL DATA:  Abdominal distention. Question free air on plain films EXAM: CT ABDOMEN AND PELVIS WITH CONTRAST TECHNIQUE: Multidetector CT imaging of the abdomen and pelvis was performed using the standard protocol following bolus administration of intravenous contrast. CONTRAST:  4mL OMNIPAQUE IOHEXOL 300 MG/ML  SOLN COMPARISON:  Plain films 07/03/2019.  CT 05/23/2019. FINDINGS: Lower chest: No acute finding Hepatobiliary: Previously seen portal venous gas no longer visualized. Prior cholecystectomy, new since prior study. Small fluid collection in the gallbladder fossa measures 4.2 x 1.6 cm. Pancreas: No focal  abnormality or ductal dilatation. Spleen: No focal abnormality.  Normal size. Adrenals/Urinary Tract: Multiple left renal stones again noted. No ureteral stones or hydronephrosis. Urinary bladder wall appears thickened. Foley catheter in place. Adrenal glands unremarkable. Stomach/Bowel: Markedly dilated large and small bowel diffusely. Previously seen pneumatosis no longer visualized. Exact source of free air not identified. Stomach is decompressed. Vascular/Lymphatic: No evidence of aneurysm or adenopathy. Reproductive: Radiation seeds in the region of the prostate. Other: Moderate pneumoperitoneum noted.  No free fluid. Musculoskeletal: No acute bony abnormality. IMPRESSION: Marked gaseous distention of large and small bowel diffusely with moderate pneumoperitoneum compatible with bowel perforation. Exact source of the free air not definitively seen. Changes of cholecystectomy. Small postoperative fluid collection in the gallbladder fossa measuring up to 4.2 cm. Bladder wall thickening. This could be related to bladder outlet obstruction or cystitis. Left nephrolithiasis.  No hydronephrosis. Critical Value/emergent results were called by telephone at the time of interpretation on 07/04/2019 at 1:58 am to provider [facility] , who verbally acknowledged these results. Electronically Signed   By: Rolm Baptise M.D.   On: 07/04/2019 02:01   DG Chest Port 1 View  Result Date: 07/05/2019 CLINICAL DATA:  PICC line placement. Pt non verbal. EXAM: PORTABLE CHEST 1 VIEW COMPARISON:  Chest radiograph 07/04/2019 FINDINGS: There is no PICC line visualized in the bilateral upper extremities. Interval removal of a nasogastric tube. Stable cardiomediastinal contours. Low lung volumes. Mild opacities the bilateral lung bases likely reflect atelectasis. No new focal opacity. No pneumothorax or significant pleural effusion. Subdiaphragmatic free air and numerous  loops of distended bowel again noted. IMPRESSION: 1. No PICC  line is visualized in the bilateral upper extremities. 2. Low lung volumes with bibasilar atelectasis. 3. Subdiaphragmatic free air again noted. Electronically Signed   By: Emmaline Kluver M.D.   On: 07/05/2019 13:02   DG Chest Portable 1 View  Result Date: 07/04/2019 CLINICAL DATA:  NG tube placement EXAM: PORTABLE CHEST 1 VIEW COMPARISON:  Radiograph 05/23/2019, same-day CT FINDINGS: Lung volumes are diminished with streaky basilar areas of atelectasis. Cardiomegaly is similar to prior with a calcified aorta. There is a large volume of subdiaphragmatic free air resulting in a Rigler sign outlining much of the air distended loops of bowel as well. The transesophageal tube tip terminates at the level of the gastric antrum/duodenal bulb, well beyond the GE junction. Surgical clips in the right upper quadrant likely reflect prior cholecystectomy. IMPRESSION: Transesophageal tube tip terminates near the level of the gastric antrum/duodenal bulb. Large volume of subdiaphragmatic free air resulting in a Rigler sign outlining numerous air distended loops of bowel compatible with findings on comparison CT. Electronically Signed   By: Kreg Shropshire M.D.   On: 07/04/2019 03:14   DG ABD ACUTE 2+V W 1V CHEST  Result Date: 07/04/2019 CLINICAL DATA:  Abdominal distension EXAM: DG ABDOMEN ACUTE W/ 1V CHEST COMPARISON:  CT 05/23/2019 FINDINGS: Low lung volumes with bibasilar atelectasis. No effusions. Vascular congestion. Marked diffuse gaseous distention of bowel. Concern for free air under the hemidiaphragms bilaterally. Prior cholecystectomy. Left nephrolithiasis. IMPRESSION: Marked diffuse gaseous distention of bowel with apparent free air under the hemidiaphragms. Findings concerning for bowel perforation. Very low lung volumes with bibasilar atelectasis and vascular congestion. These results were called by telephone at the time of interpretation on 07/04/2019 at 12:08 am to provider [facility] , who verbally  acknowledged these results. Electronically Signed   By: Charlett Nose M.D.   On: 07/04/2019 00:10    Anti-infectives: Anti-infectives (From admission, onward)   Start     Dose/Rate Route Frequency Ordered Stop   07/04/19 1000  piperacillin-tazobactam (ZOSYN) IVPB 3.375 g     3.375 g 12.5 mL/hr over 240 Minutes Intravenous Every 8 hours 07/04/19 0209     07/04/19 0215  piperacillin-tazobactam (ZOSYN) IVPB 3.375 g     3.375 g 100 mL/hr over 30 Minutes Intravenous  Once 07/04/19 0202 07/04/19 0244   07/04/19 0015  cefTRIAXone (ROCEPHIN) 1 g in sodium chloride 0.9 % 100 mL IVPB     1 g 200 mL/hr over 30 Minutes Intravenous  Once 07/04/19 0012 07/04/19 0118      Assessment/Plan: Mr. Zappone is a 69 yo with intellectual disability, dysmotility and free air on CT of unknown origin, timing. He is clinically stable without pain and labs are reassuring. He has had BMs. Ng output is high but he has dysmotility.  -DNR after my talk with [name] yesterday, no plans for surgery and if deteroriates she would want to make him comfort care -Palliative to see to just discuss overall goals of care for the long run as he will continue to have worsening issues, new complications and has had failing health over the past year  -CT with oral and IV contrast tomorrow, talked to CT and they know he may need delays due to dysmotility, and RN to roll side to side after contrast   Updated Dr. Laural Benes.    LOS: 1 day    [name] 07/05/2019

## 2019-07-05 NOTE — Progress Notes (Addendum)
PROGRESS NOTE  CAMPUS   ROBERTS BON  XBD:[id number]  DOB: 17-Aug-1950  DOA: 07/03/2019 PCP: Pearson Grippe, MD   Brief Admission Hx: 69 y.o. male with medical history significant of type 2 diabetes, gout, urolithiasis, hypertension, MR, urinary retention, admitted on May 23, 2019 due to pneumatosis intestinalis, chronic cholecystitis, megacolon, SBO and pneumoperitoneum of unknown etiology with abdominal distention.  He underwent open cholecystectomy with cholecystostomy tube placement by Dr. Henreitta Leber on 05/25/2019.  He has also had multiple surgical procedures in the past as described below.  He is brought in tonight by his caregiver who noticed hematuria after hearing "a pop" with Foley insertion.  Pt is NPO with NG in place and no plans for surgery.  Pt is DNR.    Principal Problem:   Pneumoperitoneum Active Problems:   Intellectual disability   Hyperlipidemia   HTN (hypertension)   Diabetes mellitus type 2, noninsulin dependent (HCC)   BPH (benign prostatic hyperplasia)   Hypomagnesemia   Chronic constipation   Intra-abdominal free air of unknown etiology   Cystitis   MDM/Assessment & Plan:   1. Intra-abdominal free air of unknown etiology - Pt has remained stable with conservative measures and supportive care.  Surgery has seen him and plans for CT with oral contrast 3/22.  There are no further plans for surgery at this time.  He remains on Zosyn IV.  Palliative medicine consult requested.   2. Hypoglycemia - added dextrose to IV fluids and BS much improved.  Following.  3. HTN - stable, following.  4. Hyperlipidemia - holding statin at this time while NPO.  5. DM type 2 - BS have been trending low, we are following, added dextrose to IV fluids, CBG monitoring.  6. BPH - Foley in place.  Monitor urine output closely.  7. Chronic constipation - holding oral home laxatives.  BM has been reported.   8. Intellectual disability - continue current supportive care.  His  primary caretaker has been at bedside.   DVT prophylaxis: SCD Code Status: DNR Family Communication: caretaker updated at bedside Disposition Plan: continue med surg monitoring, he has NG in place, IV fluids and planning CT 3/22 and palliative consult, not medically stable to discharge today   Consultants:  Surgery  Palliative medicine  Procedures:  NG placement   Antimicrobials:  zosyn   Subjective: Pt nonverbal.  No apparent distress.   Objective: Vitals:   07/04/19 0802 07/04/19 1204 07/04/19 2125 07/05/19 0538  BP: 100/66 (!) 99/52 (!) 111/57 (!) 103/57  Pulse: 60 68 78 93  Resp: 16 16 16 16   Temp: 98 F (36.7 C) (!) 97.5 F (36.4 C) 98.4 F (36.9 C) 97.9 F (36.6 C)  TempSrc:   Oral Oral  SpO2: 97% 99% 99% 97%  Weight:      Height:        Intake/Output Summary (Last 24 hours) at 07/05/2019 0840 Last data filed at 07/05/2019 07/07/2019 Gross per 24 hour  Intake --  Output 2950 ml  Net -2950 ml   Filed Weights   07/03/19 2031  Weight: 56.2 kg    REVIEW OF SYSTEMS  UTO due to disability  Exam:  General exam: lying in bed, awake, alert, NAD, nonverbal.   Respiratory system: Clear. No increased work of breathing. Cardiovascular system: normal S1 & S2 heard. Gastrointestinal system: Abdomen remains distended but soft, no mass palpated. Normal bowel sounds heard. Central nervous system: Alert.  Extremities: no CCE.  Data Reviewed: Basic Metabolic Panel: Recent  Labs  Lab 07/04/19 0011 07/04/19 1002 07/05/19 0428  NA 135 137 141  K 4.3 4.1 3.6  CL 101 104 104  CO2 28 27 25   GLUCOSE 132* 114* 73  BUN 17 12 10   CREATININE 0.78 0.71 0.85  CALCIUM 8.9 8.4* 8.4*  MG  --   --  1.6*   Liver Function Tests: Recent Labs  Lab 07/04/19 0011 07/04/19 1002 07/05/19 0428  AST 10* 9* 8*  ALT 8 7 8   ALKPHOS 73 67 63  BILITOT 0.3 0.3 0.7  PROT 7.3 6.3* 6.0*  ALBUMIN 3.4* 2.8* 2.8*   Recent Labs  Lab 07/04/19 0011  LIPASE 22   No results for  input(s): AMMONIA in the last 168 hours. CBC: Recent Labs  Lab 07/04/19 0011 07/04/19 1002 07/05/19 0428  WBC 7.0 4.4 6.0  NEUTROABS 5.3  --   --   HGB 9.3* 8.6* 9.0*  HCT 31.4* 28.3* 30.2*  MCV 86.3 86.3 86.3  PLT 340 305 346   Cardiac Enzymes: No results for input(s): CKTOTAL, CKMB, CKMBINDEX, TROPONINI in the last 168 hours. CBG (last 3)  Recent Labs    07/05/19 0617 07/05/19 0656 07/05/19 0738  GLUCAP 64* 119* 100*   Recent Results (from the past 240 hour(s))  Urine Culture     Status: Abnormal (Preliminary result)   Collection Time: 07/03/19 11:13 PM   Specimen: Urine, Clean Catch  Result Value Ref Range Status   Specimen Description URINE, CLEAN CATCH  Final   Special Requests   Final    NONE Performed at [facility], [address], [place], [place] [address]    Culture >=100,000 COLONIES/mL GRAM NEGATIVE RODS (A)  Final   Report Status PENDING  Incomplete  Respiratory Panel by RT PCR (Flu A&B, Covid) - Nasopharyngeal Swab     Status: None   Collection Time: 07/04/19  2:09 AM   Specimen: Nasopharyngeal Swab  Result Value Ref Range Status   SARS Coronavirus 2 by RT PCR NEGATIVE NEGATIVE Final    Comment: (NOTE) SARS-CoV-2 target nucleic acids are NOT DETECTED. The SARS-CoV-2 RNA is generally detectable in upper respiratoy specimens during the acute phase of infection. The lowest concentration of SARS-CoV-2 viral copies this assay can detect is 131 copies/mL. A negative result does not preclude SARS-Cov-2 infection and should not be used as the sole basis for treatment or other patient management decisions. A negative result may occur with  improper specimen collection/handling, submission of specimen other than nasopharyngeal swab, presence of viral mutation(s) within the areas targeted by this assay, and inadequate number of viral copies (<131 copies/mL). A negative result must be combined with clinical observations, patient history, and epidemiological  information. The expected result is Negative. Fact Sheet for Patients:  PinkCheek.be Fact Sheet for Healthcare Providers:  GravelBags.it This test is not yet ap proved or cleared by the [place] FDA and  has been authorized for detection and/or diagnosis of SARS-CoV-2 by FDA under an Emergency Use Authorization (EUA). This EUA will remain  in effect (meaning this test can be used) for the duration of the COVID-19 declaration under Section 564(b)(1) of the Act, 21 U.S.C. section 360bbb-3(b)(1), unless the authorization is terminated or revoked sooner.    Influenza A by PCR NEGATIVE NEGATIVE Final   Influenza B by PCR NEGATIVE NEGATIVE Final    Comment: (NOTE) The Xpert Xpress SARS-CoV-2/FLU/RSV assay is intended as an aid in  the diagnosis of influenza from Nasopharyngeal swab specimens and  should not  be used as a sole basis for treatment. Nasal washings and  aspirates are unacceptable for Xpert Xpress SARS-CoV-2/FLU/RSV  testing. Fact Sheet for Patients: https://www.moore.com/ Fact Sheet for Healthcare Providers: https://www.young.biz/ This test is not yet approved or cleared by the [place] FDA and  has been authorized for detection and/or diagnosis of SARS-CoV-2 by  FDA under an Emergency Use Authorization (EUA). This EUA will remain  in effect (meaning this test can be used) for the duration of the  Covid-19 declaration under Section 564(b)(1) of the Act, 21  U.S.C. section 360bbb-3(b)(1), unless the authorization is  terminated or revoked. Performed at [facility], [address] [place], [place] [address]      Studies: CT ABDOMEN PELVIS W CONTRAST  Result Date: 07/04/2019 CLINICAL DATA:  Abdominal distention. Question free air on plain films EXAM: CT ABDOMEN AND PELVIS WITH CONTRAST TECHNIQUE: Multidetector CT imaging of the abdomen and pelvis was performed using the  standard protocol following bolus administration of intravenous contrast. CONTRAST:  64mL OMNIPAQUE IOHEXOL 300 MG/ML  SOLN COMPARISON:  Plain films 07/03/2019.  CT 05/23/2019. FINDINGS: Lower chest: No acute finding Hepatobiliary: Previously seen portal venous gas no longer visualized. Prior cholecystectomy, new since prior study. Small fluid collection in the gallbladder fossa measures 4.2 x 1.6 cm. Pancreas: No focal abnormality or ductal dilatation. Spleen: No focal abnormality.  Normal size. Adrenals/Urinary Tract: Multiple left renal stones again noted. No ureteral stones or hydronephrosis. Urinary bladder wall appears thickened. Foley catheter in place. Adrenal glands unremarkable. Stomach/Bowel: Markedly dilated large and small bowel diffusely. Previously seen pneumatosis no longer visualized. Exact source of free air not identified. Stomach is decompressed. Vascular/Lymphatic: No evidence of aneurysm or adenopathy. Reproductive: Radiation seeds in the region of the prostate. Other: Moderate pneumoperitoneum noted.  No free fluid. Musculoskeletal: No acute bony abnormality. IMPRESSION: Marked gaseous distention of large and small bowel diffusely with moderate pneumoperitoneum compatible with bowel perforation. Exact source of the free air not definitively seen. Changes of cholecystectomy. Small postoperative fluid collection in the gallbladder fossa measuring up to 4.2 cm. Bladder wall thickening. This could be related to bladder outlet obstruction or cystitis. Left nephrolithiasis.  No hydronephrosis. Critical Value/emergent results were called by telephone at the time of interpretation on 07/04/2019 at 1:58 am to provider [facility] , who verbally acknowledged these results. Electronically Signed   By: Charlett Nose M.D.   On: 07/04/2019 02:01   DG Chest Portable 1 View  Result Date: 07/04/2019 CLINICAL DATA:  NG tube placement EXAM: PORTABLE CHEST 1 VIEW COMPARISON:  Radiograph 05/23/2019, same-day  CT FINDINGS: Lung volumes are diminished with streaky basilar areas of atelectasis. Cardiomegaly is similar to prior with a calcified aorta. There is a large volume of subdiaphragmatic free air resulting in a Rigler sign outlining much of the air distended loops of bowel as well. The transesophageal tube tip terminates at the level of the gastric antrum/duodenal bulb, well beyond the GE junction. Surgical clips in the right upper quadrant likely reflect prior cholecystectomy. IMPRESSION: Transesophageal tube tip terminates near the level of the gastric antrum/duodenal bulb. Large volume of subdiaphragmatic free air resulting in a Rigler sign outlining numerous air distended loops of bowel compatible with findings on comparison CT. Electronically Signed   By: Kreg Shropshire M.D.   On: 07/04/2019 03:14   DG ABD ACUTE 2+V W 1V CHEST  Result Date: 07/04/2019 CLINICAL DATA:  Abdominal distension EXAM: DG ABDOMEN ACUTE W/ 1V CHEST COMPARISON:  CT 05/23/2019 FINDINGS: Low lung  volumes with bibasilar atelectasis. No effusions. Vascular congestion. Marked diffuse gaseous distention of bowel. Concern for free air under the hemidiaphragms bilaterally. Prior cholecystectomy. Left nephrolithiasis. IMPRESSION: Marked diffuse gaseous distention of bowel with apparent free air under the hemidiaphragms. Findings concerning for bowel perforation. Very low lung volumes with bibasilar atelectasis and vascular congestion. These results were called by telephone at the time of interpretation on 07/04/2019 at 12:08 am to provider [facility] , who verbally acknowledged these results. Electronically Signed   By: Charlett Nose M.D.   On: 07/04/2019 00:10   Scheduled Meds: . Chlorhexidine Gluconate Cloth  6 each Topical Daily   Continuous Infusions: . dextrose 5 % and 0.9% NaCl 100 mL/hr at 07/05/19 0658  . magnesium sulfate bolus IVPB 4 g (07/05/19 0734)  . piperacillin-tazobactam (ZOSYN)  IV 3.375 g (07/05/19 0534)     Principal Problem:   Pneumoperitoneum Active Problems:   Intellectual disability   Hyperlipidemia   HTN (hypertension)   Diabetes mellitus type 2, noninsulin dependent (HCC)   BPH (benign prostatic hyperplasia)   Hypomagnesemia   Chronic constipation   Intra-abdominal free air of unknown etiology   Cystitis  Time spent:   Standley Dakins, MD [facility] 07/05/2019, 8:40 AM    LOS: 1 day  How to contact the [facility] Attending or Consulting provider 7A - 7P or covering provider during after hours 7P -7A, for this patient?  1. Check the care team in [facility] and look for a) attending/consulting [facility] provider listed and b) the [facility] team listed 2. Log into [email or web address] and use [place]'s universal password to access. If you do not have the password, please contact the hospital operator. 3. Locate the [facility] provider you are looking for under [facility] and page to a number that you can be directly reached. 4. If you still have difficulty reaching the provider, please page the [facility] (Director on Call) for the Hospitalists listed on amion for assistance.

## 2019-07-05 NOTE — Progress Notes (Signed)
CRITICAL VALUE ALERT  Critical Value:  CBG 64  Date & Time Notied: 07/05/19 at 0617   Provider Notified:   Orders Received/Actions taken: 1/2 amp D50 given IV and CBG 119 after

## 2019-07-06 ENCOUNTER — Inpatient Hospital Stay (HOSPITAL_COMMUNITY): Admission: RE | Admit: 2019-07-06 | Payer: Medicare Other | Source: Ambulatory Visit

## 2019-07-06 ENCOUNTER — Inpatient Hospital Stay (HOSPITAL_COMMUNITY): Payer: Medicare Other

## 2019-07-06 DIAGNOSIS — K668 Other specified disorders of peritoneum: Secondary | ICD-10-CM | POA: Diagnosis not present

## 2019-07-06 DIAGNOSIS — Z515 Encounter for palliative care: Secondary | ICD-10-CM | POA: Diagnosis not present

## 2019-07-06 DIAGNOSIS — Z7189 Other specified counseling: Secondary | ICD-10-CM | POA: Diagnosis not present

## 2019-07-06 LAB — COMPREHENSIVE METABOLIC PANEL
ALT: 6 U/L (ref 0–44)
AST: 9 U/L — ABNORMAL LOW (ref 15–41)
Albumin: 2.6 g/dL — ABNORMAL LOW (ref 3.5–5.0)
Alkaline Phosphatase: 58 U/L (ref 38–126)
Anion gap: 7 (ref 5–15)
BUN: 6 mg/dL — ABNORMAL LOW (ref 8–23)
CO2: 28 mmol/L (ref 22–32)
Calcium: 8.3 mg/dL — ABNORMAL LOW (ref 8.9–10.3)
Chloride: 104 mmol/L (ref 98–111)
Creatinine, Ser: 0.74 mg/dL (ref 0.61–1.24)
GFR calc Af Amer: >60 mL/min (ref >60)
GFR calc non Af Amer: >60 mL/min (ref >60)
Glucose, Bld: 169 mg/dL — ABNORMAL HIGH (ref 70–99)
Potassium: 3 mmol/L — ABNORMAL LOW (ref 3.5–5.1)
Sodium: 139 mmol/L (ref 135–145)
Total Bilirubin: 0.3 mg/dL (ref 0.3–1.2)
Total Protein: 6.1 g/dL — ABNORMAL LOW (ref 6.5–8.1)

## 2019-07-06 LAB — GLUCOSE, CAPILLARY
Glucose-Capillary: 159 mg/dL — ABNORMAL HIGH (ref 70–99)
Glucose-Capillary: 153 mg/dL — ABNORMAL HIGH (ref 70–99)
Glucose-Capillary: 137 mg/dL — ABNORMAL HIGH (ref 70–99)
Glucose-Capillary: 151 mg/dL — ABNORMAL HIGH (ref 70–99)

## 2019-07-06 LAB — CBC WITH DIFFERENTIAL/PLATELET
Abs Immature Granulocytes: 0.01 10*3/uL (ref 0.00–0.07)
Basophils Absolute: 0 10*3/uL (ref 0.0–0.1)
Basophils Relative: 1 %
Eosinophils Absolute: 0.1 10*3/uL (ref 0.0–0.5)
Eosinophils Relative: 2 %
HCT: 31.9 % — ABNORMAL LOW (ref 39.0–52.0)
Hemoglobin: 9.6 g/dL — ABNORMAL LOW (ref 13.0–17.0)
Immature Granulocytes: 0 %
Lymphocytes Relative: 20 %
Lymphs Abs: 1 10*3/uL (ref 0.7–4.0)
MCH: 25.5 pg — ABNORMAL LOW (ref 26.0–34.0)
MCHC: 30.1 g/dL (ref 30.0–36.0)
MCV: 84.6 fL (ref 80.0–100.0)
Monocytes Absolute: 0.5 10*3/uL (ref 0.1–1.0)
Monocytes Relative: 10 %
Neutro Abs: 3.2 10*3/uL (ref 1.7–7.7)
Neutrophils Relative %: 67 %
Platelets: 348 10*3/uL (ref 150–400)
RBC: 3.77 MIL/uL — ABNORMAL LOW (ref 4.22–5.81)
RDW: 15.3 % (ref 11.5–15.5)
WBC: 4.8 10*3/uL (ref 4.0–10.5)
nRBC: 0 % (ref 0.0–0.2)

## 2019-07-06 LAB — MAGNESIUM: Magnesium: 2 mg/dL (ref 1.7–2.4)

## 2019-07-06 MED ORDER — IOHEXOL 300 MG/ML  SOLN
100.0000 mL | Freq: Once | INTRAMUSCULAR | Status: AC | PRN
  Administered 2019-07-06: 12:00:00 100 mL via INTRAVENOUS

## 2019-07-06 MED ORDER — POTASSIUM CHLORIDE 20 MEQ PO PACK
40.0000 meq | PACK | ORAL | Status: AC
  Administered 2019-07-06 (×2): 40 meq via ORAL
  Filled 2019-07-06 (×2): qty 2

## 2019-07-06 MED ORDER — IOHEXOL 9 MG/ML PO SOLN
ORAL | Status: AC
  Administered 2019-07-06: 09:00:00 500 mL
  Filled 2019-07-06: qty 1000

## 2019-07-06 MED ORDER — BISACODYL 10 MG RE SUPP
10.0000 mg | Freq: Every day | RECTAL | Status: DC
  Administered 2019-07-06 – 2019-07-08 (×2): 10 mg via RECTAL
  Filled 2019-07-06 (×2): qty 1

## 2019-07-06 NOTE — Progress Notes (Signed)
[facility] Progress Note     Subjective: Resting comfortably. RN reports patient has not really allowed them to roll him much. He appears comfortable. Saw [name] as she was coming in. Some tears apparent. Comforted her about his current state being stable and comfortable.   Objective: Vital signs in last 24 hours: Temp:  [97.6 F (36.4 C)] 97.6 F (36.4 C) (03/21 2111) Pulse Rate:  [65-78] 65 (03/22 0548) Resp:  [18-22] 22 (03/22 0548) BP: (113-124)/(58-72) 121/69 (03/22 0548) SpO2:  [98 %-100 %] 98 % (03/22 0548) Last BM Date: 07/05/19  Intake/Output from previous day: 03/21 0701 - 03/22 0700 In: 2282.4 [I.V.:2056.8; IV Piggyback:225.7] Out: 1800 [Urine:1550; Emesis/NG output:250] Intake/Output this shift: Total I/O In: -  Out: 775 [Urine:775]  General appearance: alert and no distress Resp: normal work of breathing GI: soft, distended, nontender, midline scar healed  Lab Results:  Recent Labs    07/05/19 0428 07/06/19 0446  WBC 6.0 4.8  HGB 9.0* 9.6*  HCT 30.2* 31.9*  PLT 346 348   BMET Recent Labs    07/05/19 0428 07/06/19 0446  NA 141 139  K 3.6 3.0*  CL 104 104  CO2 25 28  GLUCOSE 73 169*  BUN 10 6*  CREATININE 0.85 0.74  CALCIUM 8.4* 8.3*    Studies/Results: DG Chest 1V REPEAT Same Day  Result Date: 07/05/2019 CLINICAL DATA:  Nasogastric tube placement. EXAM: CHEST - 1 VIEW SAME DAY COMPARISON:  Same day. FINDINGS: Stable cardiomediastinal silhouette. Nasogastric tube is seen entering stomach with distal tip in expected position of proximal stomach. No pneumothorax or pleural effusion is noted. Minimal bibasilar subsegmental atelectasis is noted. Free air is again noted under the right hemidiaphragm. Bony thorax is noted. IMPRESSION: Nasogastric tube tip seen in expected position of proximal stomach. Stable pneumoperitoneum is noted under right hemidiaphragm. Electronically Signed   By: Lupita Raider M.D.   On: 07/05/2019 15:20    DG Chest Port 1 View  Addendum Date: 07/05/2019   ADDENDUM REPORT: 07/05/2019 13:23 ADDENDUM: Interval removal of a nasogastric tube. There is no evidence of a nasogastric tube on the current film. Electronically Signed   By: Emmaline Kluver M.D.   On: 07/05/2019 13:23   Result Date: 07/05/2019 CLINICAL DATA:  PICC line placement. Pt non verbal. EXAM: PORTABLE CHEST 1 VIEW COMPARISON:  Chest radiograph 07/04/2019 FINDINGS: There is no PICC line visualized in the bilateral upper extremities. Interval removal of a nasogastric tube. Stable cardiomediastinal contours. Low lung volumes. Mild opacities the bilateral lung bases likely reflect atelectasis. No new focal opacity. No pneumothorax or significant pleural effusion. Subdiaphragmatic free air and numerous loops of distended bowel again noted. IMPRESSION: 1. No PICC line is visualized in the bilateral upper extremities. 2. Low lung volumes with bibasilar atelectasis. 3. Subdiaphragmatic free air again noted. Electronically Signed: By: Emmaline Kluver M.D. On: 07/05/2019 13:02    Anti-infectives: Anti-infectives (From admission, onward)   Start     Dose/Rate Route Frequency Ordered Stop   07/04/19 1000  piperacillin-tazobactam (ZOSYN) IVPB 3.375 g     3.375 g 12.5 mL/hr over 240 Minutes Intravenous Every 8 hours 07/04/19 0209     07/04/19 0215  piperacillin-tazobactam (ZOSYN) IVPB 3.375 g     3.375 g 100 mL/hr over 30 Minutes Intravenous  Once 07/04/19 0202 07/04/19 0244   07/04/19 0015  cefTRIAXone (ROCEPHIN) 1 g in sodium chloride 0.9 % 100 mL IVPB     1 g 200 mL/hr over 30 Minutes  Intravenous  Once 07/04/19 0012 07/04/19 0118      Assessment/Plan: [name] is a 69 yo with intellectual disability, chronic dysmotility and distention who comes in with pneumoperitoneum of unknown etiology or acuteness. He is stable and is comfortable. Labs are all normal.  He has had NG in place for decompression and on zosyn for coverage given the  perforation.    -CT pending. Oral contrast given and likely will need delays. RN attempting to roll from side to side but patient not really cooperating.   -Would get Palliative involved not so much for this hospitalization as my gut tells me he is going to be able to eat and will be back to his baseline but more for planning and goal setting for [name] for the future. [name] has had a steady decline in the last year and continues to have surgical issues and hospitalizations given his recurrent pneumatosis, portal gas and now pneumoperitoneum.   -NPO and NG for now, continue zosyn for perforation of unknown origin for now   Discussed with [name] and Dr. Denton Brick.    LOS: 2 days    Virl Cagey 07/06/2019

## 2019-07-06 NOTE — TOC Initial Note (Signed)
Transition of Care [facility]) - Initial/Assessment Note    Patient Details  Name: [name] MRN: [id number] Date of Birth: Dec 28, 1950  [facility] CM/SW Contact:    Sherie Don, LCSW Phone Number: 07/06/2019, 2:45 PM  Clinical Narrative: Completed initial assessment. TOC to follow after palliative consult.            Barriers to Discharge: Continued Medical Work up   Patient Goals and CMS Choice    Expected Discharge Plan and Services     Living arrangements for the past 2 months: Mobile Home                   DME Agency: NA     Prior Living Arrangements/Services Living arrangements for the past 2 months: Mobile Home Lives with:: Relatives([name] (aunt) PH: [phone]) Patient language and need for interpreter reviewed:: Yes Do you feel safe going back to the place where you live?: Yes      Need for Family Participation in Patient Care: Yes (Comment) Care giver support system in place?: Yes (comment)(Aunt [name])   Criminal Activity/Legal Involvement Pertinent to Current Situation/Hospitalization: No - Comment as needed  Activities of Daily Living Home Assistive Devices/Equipment: Environmental consultant (specify type) ADL Screening (condition at time of admission) Patient's cognitive ability adequate to safely complete daily activities?: No Is the patient deaf or have difficulty hearing?: Yes Does the patient have difficulty seeing, even when wearing glasses/contacts?: No Does the patient have difficulty concentrating, remembering, or making decisions?: Yes Patient able to express need for assistance with ADLs?: No Does the patient have difficulty dressing or bathing?: Yes Independently performs ADLs?: No Communication: (mute) Dressing (OT): Needs assistance Is this a change from baseline?: Pre-admission baseline Grooming: Needs assistance Is this a change from baseline?: Pre-admission baseline Feeding: Independent Bathing: Needs assistance Is this a  change from baseline?: Pre-admission baseline Toileting: Needs assistance Is this a change from baseline?: Pre-admission baseline In/Out Bed: Needs assistance Is this a change from baseline?: Pre-admission baseline Walks in Home: Needs assistance(walker ) Is this a change from baseline?: Pre-admission baseline Does the patient have difficulty walking or climbing stairs?: Yes Weakness of Legs: Both Weakness of Arms/Hands: None  Permission Sought/Granted                  Emotional Assessment   Attitude/Demeanor/Rapport: Unable to Assess Affect (typically observed): Unable to Assess Orientation: : (Unable to assess at patient has IDD and is nonverbal)      Admission diagnosis:  Pneumoperitoneum [K66.8] Cystitis [N30.90] Intra-abdominal free air of unknown etiology [K66.8] Patient Active Problem List   Diagnosis Date Noted  . Pneumoperitoneum 07/04/2019  . Intra-abdominal free air of unknown etiology 07/04/2019  . Cystitis   . Chronic cholecystitis 05/21/2019  . Chronic constipation 03/31/2019  . Cholecystostomy care ([place]) 11/04/2018  . Acute lower UTI 09/18/2018  . Abdominal distention 09/18/2018  . Hypomagnesemia 09/09/2018  . Protein-calorie malnutrition, severe ([place]) 09/09/2018  . Acute blood loss anemia 09/09/2018  . Gangrenous cholecystitis 09/05/2018  . Volvulus ([place]) 09/03/2018  . Ileus ([place])   . Pneumatosis intestinalis   . Gall bladder disease 08/25/2018  . Small bowel ischemia ([place])   . Acute gangrenous cholecystitis 05/24/2018  . SBO (small bowel obstruction) ([place]) 05/24/2018  . Abdominal distension   . Pressure injury of skin 04/09/2018  . C. difficile diarrhea 04/08/2018  . Bilateral hydronephrosis 04/08/2018  . Pneumoperitoneum of unknown etiology   . Diarrhea 04/07/2018  . Nonverbal 04/07/2018  .  Hydronephrosis with urinary obstruction due to ureteral calculus 06/20/2016  . Sepsis secondary to UTI (HCC) 06/20/2016  . Fever 06/19/2016  .  Generalized weakness 06/19/2016  . Diabetes mellitus type 2, noninsulin dependent (HCC) 06/19/2016  . Severe sepsis with septic shock (HCC) 06/19/2016  . UTI (urinary tract infection) 06/19/2016  . Hematuria 06/19/2016  . AKI (acute kidney injury) (HCC) 06/19/2016  . Hyponatremia 06/19/2016  . Hypokalemia 06/19/2016  . BPH (benign prostatic hyperplasia) 06/19/2016  . Megacolon 05/07/2014  . Urinary retention 05/07/2014  . Intellectual disability 05/07/2014  . Hyperlipidemia 05/07/2014  . HTN (hypertension) 05/07/2014  . Kidney stone 01/14/2012   PCP:  Pearson Grippe, MD Pharmacy:   [facility] [id number] - [place], [place] - [address] [place] [address] Phone: [phone] Fax: [phone]  [facility] [place], [place] - [address] [place] [address] Phone: [phone] Fax: [phone]   Readmission Risk Interventions Readmission Risk Prevention Plan 05/26/2019 09/20/2018 09/19/2018  Transportation Screening Complete Complete Complete  PCP or Specialist Appt within 3-5 Days Not Complete - -  HRI or Home Care Consult Complete - -  Social Work Consult for Recovery Care Planning/Counseling Complete - -  Palliative Care Screening Not Complete - -  Medication Review (RN Care Manager) Complete Complete Complete  PCP or Specialist appointment within 3-5 days of discharge - Complete Complete  PCP/Specialist Appt Not Complete comments - - SNF resident. Sees facility MD  HRI or Home Care Consult - Not Complete -  HRI or Home Care Consult Pt Refusal Comments - Patient is discharging to SNF for rehab -  SW Recovery Care/Counseling Consult - Complete -  Palliative Care Screening - Not Applicable -  Skilled Nursing Facility - Complete -  Some recent data might be hidden

## 2019-07-06 NOTE — Progress Notes (Signed)
[facility]  CT reviewed pneumoperitoneum persistent and contrast in a good portion of bowel, no fluid collections, no obvious extravasation, no definitely location for a perforation.  Some thickening of the rectal wall but no fluid or abscess or signs of perforation from that area.  Have discussed with Ms. [name] and she has seen palliative today.  Overall patient is stable and has no signs of a clinically significant perforation aside from the pneumoperitoneum which would not have been found if not for the ED admission for hematuria and Xray.    The contrast potentially did not touch the anterior wall of the all the bowel given the distention, but this will be impossible to study given the air fluid level.    At this time he is having bowel function. Plan for NG out and clear diet. Suppository daily per his regular bowel regiment.  If he tolerates the clears then can be advanced tomorrow and potentially home in the next day or two.  Will have to see how he does.   Levi Greenhouse, MD [facility] [address] [place], [place] [address] [phone] (office)

## 2019-07-06 NOTE — Progress Notes (Signed)
PROGRESS NOTE [facility]   [name]  ZHY:[id number]  DOB: 1950/09/25  DOA: 07/03/2019 PCP: Pearson Grippe, MD   Brief Admission Hx: 69 y.o. male with medical history significant of type 2 diabetes, gout, urolithiasis, hypertension, MR, urinary retention, admitted on May 23, 2019 due to pneumatosis intestinalis, chronic cholecystitis, megacolon, SBO and pneumoperitoneum of unknown etiology with abdominal distention.  He underwent open cholecystectomy with cholecystostomy tube placement by Dr. Henreitta Leber on 05/25/2019.  He has also had multiple surgical procedures in the past as described below.  He is brought in tonight by his caregiver who noticed hematuria after hearing "a pop" with Foley insertion.  Pt is DNR.    Principal Problem:   Pneumoperitoneum Active Problems:   Diabetes mellitus type 2, noninsulin dependent (HCC)   Intellectual disability   Hyperlipidemia   HTN (hypertension)   BPH (benign prostatic hyperplasia)   Hypomagnesemia   Chronic constipation   Intra-abdominal free air of unknown etiology   Cystitis   Palliative care by specialist   MDM/Assessment & Plan:   1)Intra-abdominal free air of unknown etiology - Pt has remained stable with conservative measures and supportive care.   CT with oral contrast 07/06/19 with persistent pneumoperitoneum but without definite source of perforation.  There are no further plans for surgery at this time.  He remains on Zosyn IV.  Palliative medicine consult requested.  NG tube removed, per general surgery okay to try clear liquid diet  2)Hypoglycemia --okay to decrease IV dextrose as oral intake improves  3) intellectual disability/limited functional status/Disposition- Patient's family reports that at baseline, he ambulates with the assistance of a walker. patient's aunt/caregiver previously declined SNF rehab, she requested discharge home with home health services   4)DM2-hold Metformin until oral intake  improves  5)BPH-- Patient has history of urinary retention and PTA did in and out cath 3 times a day.  Previously onFlomax/finasteride  -Continue Foley in situ for now  6) chronic constipation --- continue laxatives and suppositories     DVT prophylaxis: SCD Code Status: DNR Family Communication: caretaker updated at bedside Disposition Plan: Pneumoperitoneum, continue IV fluids until oral intake is reliable -Anticipate discharge home with home health in couple of days if tolerating oral intake  Consultants:  Surgery  Palliative medicine  Procedures:  NG placement   Antimicrobials:  zosyn   Subjective: --Resting comfortably, not really verbal, no fevers -Caregiver [name] at bedside Objective: Vitals:   07/05/19 1455 07/05/19 2111 07/06/19 0548 07/06/19 1545  BP: (!) 113/58 124/72 121/69 106/61  Pulse: 78 75 65 (!) 59  Resp:  18 (!) 22 18  Temp: 97.6 F (36.4 C) 97.6 F (36.4 C)  98.5 F (36.9 C)  TempSrc: Oral Oral    SpO2: 99% 100% 98% 99%  Weight:      Height:        Intake/Output Summary (Last 24 hours) at 07/06/2019 1853 Last data filed at 07/06/2019 1700 Gross per 24 hour  Intake 2294.78 ml  Output 3165 ml  Net -870.22 ml   Filed Weights   07/03/19 2031  Weight: 56.2 kg    REVIEW OF SYSTEMS  UTO due to disability  Exam:  General exam: lying in bed, awake, alert, NAD, nonverbal.   Respiratory system: Clear. No increased work of breathing. Cardiovascular system: normal S1 & S2 heard. Gastrointestinal system: Abdomen remains distended but soft, no mass palpated. Normal bowel sounds heard. Central nervous system: Alert.  Extremities: no CCE.  Data Reviewed: Basic Metabolic Panel:  Recent Labs  Lab 07/04/19 0011 07/04/19 1002 07/05/19 0428 07/06/19 0446  NA 135 137 141 139  K 4.3 4.1 3.6 3.0*  CL 101 104 104 104  CO2 28 27 25 28   GLUCOSE 132* 114* 73 169*  BUN 17 12 10  6*  CREATININE 0.78 0.71 0.85 0.74  CALCIUM 8.9 8.4* 8.4*  8.3*  MG  --   --  1.6* 2.0   Liver Function Tests: Recent Labs  Lab 07/04/19 0011 07/04/19 1002 07/05/19 0428 07/06/19 0446  AST 10* 9* 8* 9*  ALT 8 7 8 6   ALKPHOS 73 67 63 58  BILITOT 0.3 0.3 0.7 0.3  PROT 7.3 6.3* 6.0* 6.1*  ALBUMIN 3.4* 2.8* 2.8* 2.6*   Recent Labs  Lab 07/04/19 0011  LIPASE 22   No results for input(s): AMMONIA in the last 168 hours. CBC: Recent Labs  Lab 07/04/19 0011 07/04/19 1002 07/05/19 0428 07/06/19 0446  WBC 7.0 4.4 6.0 4.8  NEUTROABS 5.3  --   --  3.2  HGB 9.3* 8.6* 9.0* 9.6*  HCT 31.4* 28.3* 30.2* 31.9*  MCV 86.3 86.3 86.3 84.6  PLT 340 305 346 348   Cardiac Enzymes: No results for input(s): CKTOTAL, CKMB, CKMBINDEX, TROPONINI in the last 168 hours. CBG (last 3)  Recent Labs    07/05/19 1611 07/06/19 0549 07/06/19 1110  GLUCAP 131* 159* 153*   Recent Results (from the past 240 hour(s))  Urine Culture     Status: None (Preliminary result)   Collection Time: 07/03/19 11:13 PM   Specimen: Urine, Clean Catch  Result Value Ref Range Status   Specimen Description   Final    URINE, CLEAN CATCH Performed at [facility], [address], [place], [place] [address]    Special Requests   Final    NONE Performed at [facility], [address], [place], [place] [address]    Culture   Final    CULTURE REINCUBATED FOR BETTER GROWTH >=100,000 COLONIES/mL ESCHERICHIA COLI SUSCEPTIBILITIES TO FOLLOW Performed at [facility] [address] [place], [place] [address]    Report Status PENDING  Incomplete  Respiratory Panel by RT PCR (Flu A&B, Covid) - Nasopharyngeal Swab     Status: None   Collection Time: 07/04/19  2:09 AM   Specimen: Nasopharyngeal Swab  Result Value Ref Range Status   SARS Coronavirus 2 by RT PCR NEGATIVE NEGATIVE Final    Comment: (NOTE) SARS-CoV-2 target nucleic acids are NOT DETECTED. The SARS-CoV-2 RNA is generally detectable in upper respiratoy specimens during the acute phase of infection. The  lowest concentration of SARS-CoV-2 viral copies this assay can detect is 131 copies/mL. A negative result does not preclude SARS-Cov-2 infection and should not be used as the sole basis for treatment or other patient management decisions. A negative result may occur with  improper specimen collection/handling, submission of specimen other than nasopharyngeal swab, presence of viral mutation(s) within the areas targeted by this assay, and inadequate number of viral copies (<131 copies/mL). A negative result must be combined with clinical observations, patient history, and epidemiological information. The expected result is Negative. Fact Sheet for Patients:  PinkCheek.be Fact Sheet for Healthcare Providers:  GravelBags.it This test is not yet ap proved or cleared by the [place] FDA and  has been authorized for detection and/or diagnosis of SARS-CoV-2 by FDA under an Emergency Use Authorization (EUA). This EUA will remain  in effect (meaning this test can be used) for the duration of the  COVID-19 declaration under Section 564(b)(1) of the Act, 21 U.S.C. section 360bbb-3(b)(1), unless the authorization is terminated or revoked sooner.    Influenza A by PCR NEGATIVE NEGATIVE Final   Influenza B by PCR NEGATIVE NEGATIVE Final    Comment: (NOTE) The Xpert Xpress SARS-CoV-2/FLU/RSV assay is intended as an aid in  the diagnosis of influenza from Nasopharyngeal swab specimens and  should not be used as a sole basis for treatment. Nasal washings and  aspirates are unacceptable for Xpert Xpress SARS-CoV-2/FLU/RSV  testing. Fact Sheet for Patients: https://www.moore.com/ Fact Sheet for Healthcare Providers: https://www.young.biz/ This test is not yet approved or cleared by the [place] FDA and  has been authorized for detection and/or diagnosis of SARS-CoV-2 by  FDA under an Emergency  Use Authorization (EUA). This EUA will remain  in effect (meaning this test can be used) for the duration of the  Covid-19 declaration under Section 564(b)(1) of the Act, 21  U.S.C. section 360bbb-3(b)(1), unless the authorization is  terminated or revoked. Performed at [facility], [address] [place], [place] [address]   C difficile quick scan w PCR reflex     Status: None   Collection Time: 07/05/19  9:36 AM   Specimen: STOOL  Result Value Ref Range Status   C Diff antigen NEGATIVE NEGATIVE Final   C Diff toxin NEGATIVE NEGATIVE Final   C Diff interpretation No C. difficile detected.  Final    Comment: Performed at [facility], [address], [place], [place] [address]     Studies: CT ABDOMEN PELVIS W CONTRAST  Result Date: 07/06/2019 CLINICAL DATA:  Possible pneumoperitoneum. EXAM: CT ABDOMEN AND PELVIS WITH CONTRAST TECHNIQUE: Multidetector CT imaging of the abdomen and pelvis was performed using the standard protocol following bolus administration of intravenous contrast. CONTRAST:  OMNIPAQUE IOHEXOL 300 MG/ML  SOLN COMPARISON:  July 04, 2019. FINDINGS: Lower chest: Small right pleural effusion is noted. Hepatobiliary: No focal liver abnormality is seen. Status post cholecystectomy. No biliary dilatation. Pancreas: Unremarkable. No pancreatic ductal dilatation or surrounding inflammatory changes. Spleen: Normal in size without focal abnormality. Adrenals/Urinary Tract: Adrenal glands appear normal. Nonobstructive left nephrolithiasis is noted. No hydronephrosis or renal obstruction is noted. Urinary bladder is decompressed secondary to Foley catheter. Stomach/Bowel: Stomach is unremarkable. The appendix appears normal. There remains significant distension of the large and small bowel. There is now noted wall thickening of the rectum concerning for possible inflammation. Vascular/Lymphatic: No significant vascular findings are present. No enlarged abdominal or pelvic lymph nodes.  Reproductive: Prostatic brachytherapy seed placement is noted. Other: There is continued presence of pneumoperitoneum concerning for rupture of hollow viscus. This was noted on prior exam. Musculoskeletal: No acute or significant osseous findings. IMPRESSION: 1. Continued presence of pneumoperitoneum concerning for rupture of hollow viscus. 2. Small right pleural effusion is noted. 3. Nonobstructive left nephrolithiasis. 4. Persistent significant distension of the large and small bowel is noted concerning for ileus. There is now noted wall thickening of the rectum concerning for possible inflammation. Electronically Signed   By: Lupita Raider M.D.   On: 07/06/2019 13:41   DG Chest 1V REPEAT Same Day  Result Date: 07/05/2019 CLINICAL DATA:  Nasogastric tube placement. EXAM: CHEST - 1 VIEW SAME DAY COMPARISON:  Same day. FINDINGS: Stable cardiomediastinal silhouette. Nasogastric tube is seen entering stomach with distal tip in expected position of proximal stomach. No pneumothorax or pleural effusion is noted. Minimal bibasilar subsegmental atelectasis is noted. Free air is again noted under the right  hemidiaphragm. Bony thorax is noted. IMPRESSION: Nasogastric tube tip seen in expected position of proximal stomach. Stable pneumoperitoneum is noted under right hemidiaphragm. Electronically Signed   By: Lupita Raider M.D.   On: 07/05/2019 15:20   DG Chest Port 1 View  Addendum Date: 07/05/2019   ADDENDUM REPORT: 07/05/2019 13:23 ADDENDUM: Interval removal of a nasogastric tube. There is no evidence of a nasogastric tube on the current film. Electronically Signed   By: Emmaline Kluver M.D.   On: 07/05/2019 13:23   Result Date: 07/05/2019 CLINICAL DATA:  PICC line placement. Pt non verbal. EXAM: PORTABLE CHEST 1 VIEW COMPARISON:  Chest radiograph 07/04/2019 FINDINGS: There is no PICC line visualized in the bilateral upper extremities. Interval removal of a nasogastric tube. Stable cardiomediastinal  contours. Low lung volumes. Mild opacities the bilateral lung bases likely reflect atelectasis. No new focal opacity. No pneumothorax or significant pleural effusion. Subdiaphragmatic free air and numerous loops of distended bowel again noted. IMPRESSION: 1. No PICC line is visualized in the bilateral upper extremities. 2. Low lung volumes with bibasilar atelectasis. 3. Subdiaphragmatic free air again noted. Electronically Signed: By: Emmaline Kluver M.D. On: 07/05/2019 13:02   Scheduled Meds:  bisacodyl  10 mg Rectal Daily   Chlorhexidine Gluconate Cloth  6 each Topical Daily   potassium chloride  40 mEq Oral Q3H   Continuous Infusions:  dextrose 5 % and 0.9% NaCl Stopped (07/06/19 1215)   piperacillin-tazobactam (ZOSYN)  IV 12.5 mL/hr at 07/06/19 1645    Principal Problem:   Pneumoperitoneum Active Problems:   Diabetes mellitus type 2, noninsulin dependent (HCC)   Intellectual disability   Hyperlipidemia   HTN (hypertension)   BPH (benign prostatic hyperplasia)   Hypomagnesemia   Chronic constipation   Intra-abdominal free air of unknown etiology   Cystitis   Palliative care by specialist  Time spent:   Shon Hale, MD [facility] 07/06/2019, 6:53 PM    LOS: 2 days  How to contact the [facility] Attending or Consulting provider 7A - 7P or covering provider during after hours 7P -7A, for this patient?  1. Check the care team in [facility] and look for a) attending/consulting [facility] provider listed and b) the [facility] team listed 2. Log into [email or web address] and use [facility]'s universal password to access. If you do not have the password, please contact the hospital operator. 3. Locate the [facility] provider you are looking for under [facility] and page to a number that you can be directly reached. 4. If you still have difficulty reaching the provider, please page the [facility] (Director on Call) for the Hospitalists listed on amion for assistance.

## 2019-07-06 NOTE — Consult Note (Signed)
Consultation Note Date: 07/06/2019   Patient Name: [name]  DOB: 1951/01/24  MRN: [id number]  Age / Sex: 69 y.o., male  PCP: Levi Gravel, MD Referring Physician: Roxan Hockey, MD  Reason for Consultation: Establishing goals of care  HPI/Patient Profile: 69 y.o. male  with past medical history of diabetes, gout, urolithiasis, hypertension, intellectual disability, urinary retention, chronic pseudo-obstruction and ileus, gangrenous cholecystitis, volvulus, recent admission 05/23/19-05/29/19 with pneumatosis intestinalis, chronic cholecystitis, megacolon, SBO, pneumoperitoneum s/p cholecystostomy with cholecystostomy tube 05/25/19 admitted on 07/03/2019 with bleeding from penis and pneumoperitoneum. Per surgery notes - conservative care with no plans for surgery and plans for comfort care with further decline. Health decline and complications over the past year noted.   Clinical Assessment and Goals of Care: I met today at [name] bedside and spoke with his legal guardian, [name]. [name] shares with me that she and her siblings grew up with [name] as a brother as her mother raised him. [name] and her siblings have been caring for [name] for many years. She describes him as very quiet and very funny. He loves his church and participating in church music. She knows that he loves God very much.   [name] explains to me [name]'s decline and health complications over the last couple years. She shares that they love him and want him around as [name] as possible but they do not want him to suffer. They have decided for no further surgery. They would not want his life prolonged artificially and want to take [name]'s lead with what his body is able to do. [name] shares that when God is ready for him they can accept that - she learned this when her mother died and we discussed the selflessness of allowing for comfort and peace at  end of life. Although they are very hopeful that [name] will improve and be able to enjoy more time at home with them they are accepting that his health is declining.   At this time plan is to continue with current therapies. Would not escalate to invasive/aggressive interventions to prolong life but rather focus on comfort care. [name] is supported by a very loving family.   Primary Decision Maker LEGAL GUARDIAN [name]    SUMMARY OF RECOMMENDATIONS   - Continue current interventions - Comfort if he declines  Code Status/Advance Care Planning:  DNR   Symptom Management:   No current symptoms. [name] confirms that he can express pain by facial expression or yes/no question.   Palliative Prophylaxis:   Aspiration, Bowel Regimen, Delirium Protocol, Frequent Pain Assessment, Oral Care and Turn Reposition  Psycho-social/Spiritual:   Desire for further Chaplaincy support:yes  Additional Recommendations: Caregiving  Support/Resources and Grief/Bereavement Support  Prognosis:   Overall health decline over months-years. Likely to improve from this hospitalization but concern for overall failure to thrive and functional decline.   Discharge Planning: Goal to return to [facility] home. Will discuss support services based on desire for aggressiveness of care moving forward.      Primary Diagnoses: Present  on Admission: . Pneumoperitoneum . HTN (hypertension) . Hyperlipidemia . BPH (benign prostatic hyperplasia) . Chronic constipation . Intra-abdominal free air of unknown etiology . Hypomagnesemia   I have reviewed the medical record, interviewed the patient and family, and examined the patient. The following aspects are pertinent.  Past Medical History:  Diagnosis Date  . Diabetes mellitus without complication ([name])   . History of gout   . History of kidney stones   . Hypertension   . Mentally challenged   . Urinary retention    Social History    Socioeconomic History  . Marital status: Single    Spouse name: Not on file  . Number of children: Not on file  . Years of education: Not on file  . Highest education level: Not on file  Occupational History  . Not on file  Tobacco Use  . Smoking status: Never Smoker  . Smokeless tobacco: Never Used  Substance and Sexual Activity  . Alcohol use: No  . Drug use: No  . Sexual activity: Never    Birth control/protection: None  Other Topics Concern  . Not on file  Social History Narrative   Patient is delayed lives with family   Social Determinants of Health   Financial Resource Strain:   . Difficulty of Paying Living Expenses:   Food Insecurity:   . Worried About Charity fundraiser in the Last Year:   . Arboriculturist in the Last Year:   Transportation Needs:   . Film/video editor (Medical):   Marland Kitchen Lack of Transportation (Non-Medical):   Physical Activity:   . Days of Exercise per Week:   . Minutes of Exercise per Session:   Stress:   . Feeling of Stress :   Social Connections:   . Frequency of Communication with Friends and Family:   . Frequency of Social Gatherings with Friends and Family:   . Attends Religious Services:   . Active Member of Clubs or Organizations:   . Attends Archivist Meetings:   Marland Kitchen Marital Status:    Family History  Problem Relation Age of Onset  . Gallbladder disease Other    Scheduled Meds: . Chlorhexidine Gluconate Cloth  6 each Topical Daily   Continuous Infusions: . dextrose 5 % and 0.9% NaCl 100 mL/hr at 07/06/19 0535  . piperacillin-tazobactam (ZOSYN)  IV 3.375 g (07/06/19 0534)   PRN Meds:.ondansetron **OR** ondansetron (ZOFRAN) IV No Known Allergies Review of Systems  Unable to perform ROS: Other  Constitutional:       Developmental delay since birth    Physical Exam Vitals and nursing note reviewed.  Constitutional:      General: He is not in acute distress.    Appearance: He is ill-appearing.   Cardiovascular:     Rate and Rhythm: Normal rate.  Pulmonary:     Effort: Pulmonary effort is normal. No tachypnea, accessory muscle usage or respiratory distress.  Abdominal:     General: There is distension.     Palpations: Abdomen is soft.     Tenderness: There is no abdominal tenderness.     Comments: NGT to LIWS  Neurological:     Mental Status: He is alert.     Comments: He can speak some at baseline and family can tell he feels bad as he is not as interactive as usual  Psychiatric:        Behavior: Behavior is cooperative.     Vital Signs: BP 121/69 (BP Location:  Right Arm)   Pulse 65   Temp 97.6 F (36.4 C) (Oral)   Resp (!) 22   Ht 5' 7" (1.702 m)   Wt 56.2 kg   SpO2 98%   BMI 19.42 kg/m  Pain Scale: Faces POSS *See Group Information*: 1-Acceptable,Awake and alert Pain Score: 0-No pain   SpO2: SpO2: 98 % O2 Device:SpO2: 98 % O2 Flow Rate: .   IO: Intake/output summary:   Intake/Output Summary (Last 24 hours) at 07/06/2019 5361 Last data filed at 07/06/2019 4431 Gross per 24 hour  Intake 2282.42 ml  Output 2125 ml  Net 157.42 ml    LBM: Last BM Date: 07/05/19 Baseline Weight: Weight: 56.2 kg Most recent weight: Weight: 56.2 kg     Palliative Assessment/Data:     Time In: 1330 Time Out: 1430 Time Total: 60 min Greater than 50%  of this time was spent counseling and coordinating care related to the above assessment and plan.  Signed by: Vinie Sill, NP Palliative Medicine Team Pager # [phone] (M-F 8a-5p) Team Phone # [phone] (Nights/Weekends)

## 2019-07-07 DIAGNOSIS — Z515 Encounter for palliative care: Secondary | ICD-10-CM | POA: Diagnosis not present

## 2019-07-07 DIAGNOSIS — K668 Other specified disorders of peritoneum: Secondary | ICD-10-CM | POA: Diagnosis not present

## 2019-07-07 DIAGNOSIS — Z7189 Other specified counseling: Secondary | ICD-10-CM | POA: Diagnosis not present

## 2019-07-07 LAB — GLUCOSE, CAPILLARY
Glucose-Capillary: 110 mg/dL — ABNORMAL HIGH (ref 70–99)
Glucose-Capillary: 175 mg/dL — ABNORMAL HIGH (ref 70–99)

## 2019-07-07 LAB — BASIC METABOLIC PANEL
Anion gap: 4 — ABNORMAL LOW (ref 5–15)
BUN: 5 mg/dL — ABNORMAL LOW (ref 8–23)
CO2: 28 mmol/L (ref 22–32)
Calcium: 8.3 mg/dL — ABNORMAL LOW (ref 8.9–10.3)
Chloride: 106 mmol/L (ref 98–111)
Creatinine, Ser: 0.73 mg/dL (ref 0.61–1.24)
GFR calc Af Amer: >60 mL/min (ref >60)
GFR calc non Af Amer: >60 mL/min (ref >60)
Glucose, Bld: 132 mg/dL — ABNORMAL HIGH (ref 70–99)
Potassium: 4 mmol/L (ref 3.5–5.1)
Sodium: 138 mmol/L (ref 135–145)

## 2019-07-07 LAB — CBC
HCT: 34.7 % — ABNORMAL LOW (ref 39.0–52.0)
Hemoglobin: 10.1 g/dL — ABNORMAL LOW (ref 13.0–17.0)
MCH: 24.7 pg — ABNORMAL LOW (ref 26.0–34.0)
MCHC: 29.1 g/dL — ABNORMAL LOW (ref 30.0–36.0)
MCV: 84.8 fL (ref 80.0–100.0)
Platelets: 365 10*3/uL (ref 150–400)
RBC: 4.09 MIL/uL — ABNORMAL LOW (ref 4.22–5.81)
RDW: 15.1 % (ref 11.5–15.5)
WBC: 4.6 10*3/uL (ref 4.0–10.5)
nRBC: 0 % (ref 0.0–0.2)

## 2019-07-07 LAB — URINE CULTURE: Culture: >=100000 — AB

## 2019-07-07 NOTE — Progress Notes (Signed)
Palliative:  HPI: 69 y.o. male  with past medical history of diabetes, gout, urolithiasis, hypertension, intellectual disability, urinary retention, chronic pseudo-obstruction and ileus, gangrenous cholecystitis, volvulus, recent admission 05/23/19-05/29/19 with pneumatosis intestinalis, chronic cholecystitis, megacolon, SBO, pneumoperitoneum s/p cholecystostomy with cholecystostomy tube 05/25/19 admitted on 07/03/2019 with bleeding from penis and pneumoperitoneum. Per surgery notes - conservative care with no plans for surgery and plans for comfort care with further decline. Health decline and complications over the past year noted.    I met today at Burrell's bedside with his aunt [name]. He has had NGT removed and is eating/drinking well. She reports that she had to slow him down to eat his lunch. He is much more alert and bright eyed today and appears to be getting back to his baseline. He waves to me hello and goodbye.   I spoke further with [name] who is hoping to get him home soon. She is pleased with his progress. She also expresses understanding and acceptance that he will continue with health and particular GI complications that could ultimately lead to the end of his life. She desires for him not to suffer and would not desire aggressive or invasive interventions and no plans for further surgical intervention. I gave her a MOST form and reviewed with her. Explained this is a prescription for how aggressive we want care for [name] at this stage in his life. [name] understands and will continue to review and consider his goals moving forward. MOST was not complete today.   At this time all questions/concerns addressed. Emotional support provided.   Exam: More alert and interactive. No distress. Mostly non-verbal. Breathing regular, unlabored. Abd flat, soft, non-tender.   Plan: - Will ask for PT eval to get him moving and prepare for transition back to home.  - Recommend outpatient palliative care to  follow and support.   25 min  Vinie Sill, NP Palliative Medicine Team Pager [phone] (Please see [email or web address] for schedule) Team Phone [phone]    Greater than 50%  of this time was spent counseling and coordinating care related to the above assessment and plan

## 2019-07-07 NOTE — Progress Notes (Signed)
Pharmacy Antibiotic Note  [name] is a 69 y.o. male admitted on 07/03/2019 with intra-abd infection.  Pharmacy has been consulted for Zosyn dosing.  Plan: Continue Zosyn 3.375gm IV q8h Will f/u SCr, micro data, and pt's clinical condition  Height: 5\' 7"  (170.2 cm) Weight: 124 lb (56.2 kg) IBW/kg (Calculated) : 66.1  Temp (24hrs), Avg:97.9 F (36.6 C), Min:97.3 F (36.3 C), Max:98.5 F (36.9 C)  Recent Labs  Lab 07/04/19 0011 07/04/19 1002 07/05/19 0428 07/06/19 0446 07/07/19 0542  WBC 7.0 4.4 6.0 4.8 4.6  CREATININE 0.78 0.71 0.85 0.74 0.73  LATICACIDVEN 0.9  --   --   --   --     Estimated Creatinine Clearance: 69.3 mL/min (by C-G formula based on SCr of 0.73 mg/dL).    No Known Allergies  Antimicrobials this admission: 3/20 Rocephin x 1 3/20 Zosyn >>   Microbiology results: 3/19 UCx:  100k e. Coli 3/21 Cdiff: negative   Thank you for allowing pharmacy to be a part of this patient's care.  4/21, PharmD Clinical Pharmacist 07/07/2019 8:20 AM

## 2019-07-07 NOTE — Progress Notes (Addendum)
I was present with the medical student for this service. I personally verified the history of present illness, performed the physical exam, and made the plan for this encounter. I have verified the medical student's documentation and made modifications where appropriately. I have personally documented in my own words a brief history, physical, and plan below.     Tolerating diet and no pain. Animated this am with breakfast. BP 112/61 (BP Location: Right Arm)   Pulse 63   Temp (!) 97.3 F (36.3 C) (Oral)   Resp 20   Ht 5\' 7"  (1.702 m)   Wt 56.2 kg   SpO2 99%   BMI 19.42 kg/m  NAD Normal work breathing Soft, distended, non tender  Advanced to soft diet. [name] had asked about COVID vaccine and I discussed with Dr. Denton Brick. Theoretical risk of fever with second dose, but also risk of COVID without vaccine. Can proceed with vaccine but needs to know fevers can occur.  Patient may end up additional testing if febrile and comes to the ED with other issues.   I can see him in office to check on him.   Future Appointments  Date Time Provider Stewart  07/21/2019 11:30 AM Virl Cagey, MD RS-RS None  09/02/2019  2:00 PM McKenzie, Candee Furbish, MD AUR-AUR None  09/29/2019 11:15 AM Rehman, Mechele Dawley, MD NRE-NRE None     Curlene Labrum, MD [facility] [address] [place], [place] [address] [phone] (office)     Subjective: Pt is a 69-yr-old man with a PMH significant for type 2 diabetes, gout, urolithiasis, hypertension, mitral regurgitation, and urinary retention who presents to the hospital with pneumoperitoneum of unknown etiology, and chronic abdominal distension. Cholecystectomy and cholecystotomy were performed by Dr. Constance Haw on 05/25/2019. History is derived from previous notes as pt is nonverbal.  Objective: Vital signs in last 24 hours: Temp:  [97.3 F (36.3 C)-98.5 F (36.9 C)] 97.3 F (36.3 C) (03/22 2214) Pulse Rate:   [59-63] 63 (03/23 0608) Resp:  [12-20] 20 (03/23 0608) BP: (101-112)/(61-62) 112/61 (03/23 0608) SpO2:  [98 %-99 %] 99 % (03/23 0938) Last BM Date: 07/06/19  Intake/Output from previous day: 03/22 0701 - 03/23 0700 In: 1160.2 [P.O.:60; I.V.:956.3; IV Piggyback:143.9] Out: 2165 [Urine:1925; Emesis/NG output:240] Intake/Output this shift: No intake/output data recorded.  General appearance: alert and cooperative. Pt is nonverbal but responsive to commands Resp: Lung exam was difficult to perform as pt was only able to sit up slightly. Normal lung sounds auscultated minimally through chest and right side of back Cardio: regular rate and rhythm, S1, S2 normal, no murmur, click, rub or gallop GI: Abdomen is soft and distended. Surgical scar is present midline  Lab Results:  Recent Labs    07/06/19 0446 07/07/19 0542  WBC 4.8 4.6  HGB 9.6* 10.1*  HCT 31.9* 34.7*  PLT 348 365   BMET Recent Labs    07/06/19 0446 07/07/19 0542  NA 139 138  K 3.0* 4.0  CL 104 106  CO2 28 28  GLUCOSE 169* 132*  BUN 6* 5*  CREATININE 0.74 0.73  CALCIUM 8.3* 8.3*   PT/INR No results for input(s): LABPROT, INR in the last 72 hours.  Studies/Results: CT ABDOMEN PELVIS W CONTRAST  Result Date: 07/06/2019 CLINICAL DATA:  Possible pneumoperitoneum. EXAM: CT ABDOMEN AND PELVIS WITH CONTRAST TECHNIQUE: Multidetector CT imaging of the abdomen and pelvis was performed using the standard protocol following bolus administration of intravenous contrast. CONTRAST:  117mL OMNIPAQUE  IOHEXOL 300 MG/ML  SOLN COMPARISON:  July 04, 2019. FINDINGS: Lower chest: Small right pleural effusion is noted. Hepatobiliary: No focal liver abnormality is seen. Status post cholecystectomy. No biliary dilatation. Pancreas: Unremarkable. No pancreatic ductal dilatation or surrounding inflammatory changes. Spleen: Normal in size without focal abnormality. Adrenals/Urinary Tract: Adrenal glands appear normal. Nonobstructive left  nephrolithiasis is noted. No hydronephrosis or renal obstruction is noted. Urinary bladder is decompressed secondary to Foley catheter. Stomach/Bowel: Stomach is unremarkable. The appendix appears normal. There remains significant distension of the large and small bowel. There is now noted wall thickening of the rectum concerning for possible inflammation. Vascular/Lymphatic: No significant vascular findings are present. No enlarged abdominal or pelvic lymph nodes. Reproductive: Prostatic brachytherapy seed placement is noted. Other: There is continued presence of pneumoperitoneum concerning for rupture of hollow viscus. This was noted on prior exam. Musculoskeletal: No acute or significant osseous findings. IMPRESSION: 1. Continued presence of pneumoperitoneum concerning for rupture of hollow viscus. 2. Small right pleural effusion is noted. 3. Nonobstructive left nephrolithiasis. 4. Persistent significant distension of the large and small bowel is noted concerning for ileus. There is now noted wall thickening of the rectum concerning for possible inflammation. Electronically Signed   By: Lupita Raider M.D.   On: 07/06/2019 13:41   DG Chest 1V REPEAT Same Day  Result Date: 07/05/2019 CLINICAL DATA:  Nasogastric tube placement. EXAM: CHEST - 1 VIEW SAME DAY COMPARISON:  Same day. FINDINGS: Stable cardiomediastinal silhouette. Nasogastric tube is seen entering stomach with distal tip in expected position of proximal stomach. No pneumothorax or pleural effusion is noted. Minimal bibasilar subsegmental atelectasis is noted. Free air is again noted under the right hemidiaphragm. Bony thorax is noted. IMPRESSION: Nasogastric tube tip seen in expected position of proximal stomach. Stable pneumoperitoneum is noted under right hemidiaphragm. Electronically Signed   By: Lupita Raider M.D.   On: 07/05/2019 15:20   DG Chest Port 1 View  Addendum Date: 07/05/2019   ADDENDUM REPORT: 07/05/2019 13:23 ADDENDUM:  Interval removal of a nasogastric tube. There is no evidence of a nasogastric tube on the current film. Electronically Signed   By: Emmaline Kluver M.D.   On: 07/05/2019 13:23   Result Date: 07/05/2019 CLINICAL DATA:  PICC line placement. Pt non verbal. EXAM: PORTABLE CHEST 1 VIEW COMPARISON:  Chest radiograph 07/04/2019 FINDINGS: There is no PICC line visualized in the bilateral upper extremities. Interval removal of a nasogastric tube. Stable cardiomediastinal contours. Low lung volumes. Mild opacities the bilateral lung bases likely reflect atelectasis. No new focal opacity. No pneumothorax or significant pleural effusion. Subdiaphragmatic free air and numerous loops of distended bowel again noted. IMPRESSION: 1. No PICC line is visualized in the bilateral upper extremities. 2. Low lung volumes with bibasilar atelectasis. 3. Subdiaphragmatic free air again noted. Electronically Signed: By: Emmaline Kluver M.D. On: 07/05/2019 13:02    Anti-infectives: Anti-infectives (From admission, onward)   Start     Dose/Rate Route Frequency Ordered Stop   07/04/19 1000  piperacillin-tazobactam (ZOSYN) IVPB 3.375 g     3.375 g 12.5 mL/hr over 240 Minutes Intravenous Every 8 hours 07/04/19 0209     07/04/19 0215  piperacillin-tazobactam (ZOSYN) IVPB 3.375 g     3.375 g 100 mL/hr over 30 Minutes Intravenous  Once 07/04/19 0202 07/04/19 0244   07/04/19 0015  cefTRIAXone (ROCEPHIN) 1 g in sodium chloride 0.9 % 100 mL IVPB     1 g 200 mL/hr over 30 Minutes Intravenous  Once 07/04/19  0012 07/04/19 0118      Assessment/Plan: s/p  Assessment: Pt is a 68-yr-old man with chronic cholecystitis, pneumoperitoneum of unknown etiology, and chronic abdominal distension. He appears to be doing well and was eating solid foods without difficulty. Plan: Administer IV antibiotics and pain medication as needed. Continue monitoring pt for abdominal issues.  LOS: 3 days    Denita Lung 07/07/2019

## 2019-07-07 NOTE — Progress Notes (Signed)
PROGRESS NOTE [name]   [name]  KVQ:[id number]  DOB: [birth year]  DOA: 07/03/2019 PCP: Pearson Grippe, MD   Brief Admission Hx: 69 y.o. male with medical history significant of type 2 diabetes, gout, urolithiasis, hypertension, MR, urinary retention, admitted on May 23, 2019 due to pneumatosis intestinalis, chronic cholecystitis, megacolon, SBO and pneumoperitoneum of unknown etiology with abdominal distention.  He underwent open cholecystectomy with cholecystostomy tube placement by Dr. Henreitta Leber on 05/25/2019.  He has also had multiple surgical procedures in the past as described below.  He is brought in tonight by his caregiver who noticed hematuria after hearing "a pop" with Foley insertion.  Pt is DNR.    Principal Problem:   Pneumoperitoneum Active Problems:   Diabetes mellitus type 2, noninsulin dependent (HCC)   Intellectual disability   Hyperlipidemia   HTN (hypertension)   BPH (benign prostatic hyperplasia)   Hypomagnesemia   Chronic constipation   Intra-abdominal free air of unknown etiology   Cystitis   Palliative care by specialist   MDM/Assessment & Plan:   1)Intra-abdominal free air of unknown etiology - Pt has remained stable with conservative measures and supportive care.   CT with oral contrast 07/06/19 with persistent pneumoperitoneum but without definite source of perforation.  There are no further plans for surgery at this time.  He remains on Zosyn IV.  Palliative medicine consult requested.  NG tube removed per general surgery  -Patient tolerated clear liquid diet okay to advance diet as per general surgery - 2)Hypoglycemia --okay to decrease IV dextrose as oral intake improves  3)Intellectual disability/Limited functional status/Disposition- Patient's family reports that at baseline, he ambulates with the assistance of a walker. Patient's aunt/caregiver previously declined SNF rehab, she requested discharge home with home health services      4)DM2- hold Metformin until oral intake improves  5)BPH-- Patient has history of urinary retention and PTA did in and out cath 3 times a day.  Previously onFlomax/finasteride  -Continue Foley in situ for now  6)Chronic constipation --- continue laxatives and suppositories    DVT prophylaxis: SCD Code Status: DNR Family Communication: caretaker updated at bedside Disposition Plan: Pneumoperitoneum, continue IV fluids until oral intake is reliable -Anticipate discharge home with home health in 07/08/2019 if tolerating oral intake  Consultants:  Surgery  Palliative medicine  Procedures:  NG placement   -NG tube removed 07/06/2019  Antimicrobials:  zosyn   Subjective: --Tolerating oral intake -No emesis -No further BM today   Objective: Vitals:   07/06/19 1545 07/06/19 2214 07/07/19 0608 07/07/19 0938  BP: 106/61 101/62 112/61   Pulse: (!) 59 61 63   Resp: 18 12 20    Temp: 98.5 F (36.9 C) (!) 97.3 F (36.3 C)    TempSrc:  Oral    SpO2: 99%  98% 99%  Weight:      Height:        Intake/Output Summary (Last 24 hours) at 07/07/2019 1430 Last data filed at 07/07/2019 0400 Gross per 24 hour  Intake 1160.23 ml  Output 800 ml  Net 360.23 ml   Filed Weights   07/03/19 2031  Weight: 56.2 kg    REVIEW OF SYSTEMS  UTO due to disability  Exam:  General exam: lying in bed, awake, alert, NAD, nonverbal.   Respiratory system: Clear. No increased work of breathing. Cardiovascular system: normal S1 & S2 heard. Gastrointestinal system: Abdomen remains distended but soft, no mass palpated. Normal bowel sounds heard. Central nervous system: Alert.  Extremities: no  CCE.  Data Reviewed: Basic Metabolic Panel: Recent Labs  Lab 07/04/19 0011 07/04/19 1002 07/05/19 0428 07/06/19 0446 07/07/19 0542  NA 135 137 141 139 138  K 4.3 4.1 3.6 3.0* 4.0  CL 101 104 104 104 106  CO2 28 27 25 28 28   GLUCOSE 132* 114* 73 169* 132*  BUN 17 12 10  6* 5*  CREATININE  0.78 0.71 0.85 0.74 0.73  CALCIUM 8.9 8.4* 8.4* 8.3* 8.3*  MG  --   --  1.6* 2.0  --    Liver Function Tests: Recent Labs  Lab 07/04/19 0011 07/04/19 1002 07/05/19 0428 07/06/19 0446  AST 10* 9* 8* 9*  ALT 8 7 8 6   ALKPHOS 73 67 63 58  BILITOT 0.3 0.3 0.7 0.3  PROT 7.3 6.3* 6.0* 6.1*  ALBUMIN 3.4* 2.8* 2.8* 2.6*   Recent Labs  Lab 07/04/19 0011  LIPASE 22   No results for input(s): AMMONIA in the last 168 hours. CBC: Recent Labs  Lab 07/04/19 0011 07/04/19 1002 07/05/19 0428 07/06/19 0446 07/07/19 0542  WBC 7.0 4.4 6.0 4.8 4.6  NEUTROABS 5.3  --   --  3.2  --   HGB 9.3* 8.6* 9.0* 9.6* 10.1*  HCT 31.4* 28.3* 30.2* 31.9* 34.7*  MCV 86.3 86.3 86.3 84.6 84.8  PLT 340 305 346 348 365   Cardiac Enzymes: No results for input(s): CKTOTAL, CKMB, CKMBINDEX, TROPONINI in the last 168 hours. CBG (last 3)  Recent Labs    07/06/19 2318 07/07/19 0653 07/07/19 1149  GLUCAP 151* 110* 175*   Recent Results (from the past 240 hour(s))  Urine Culture     Status: Abnormal   Collection Time: 07/03/19 11:13 PM   Specimen: Urine, Clean Catch  Result Value Ref Range Status   Specimen Description   Final    URINE, CLEAN CATCH Performed at [facility], [address], [place], [place] [address]    Special Requests   Final    NONE Performed at [facility], [address], [place], [place] [address]    Culture >=100,000 COLONIES/mL ESCHERICHIA COLI (A)  Final   Report Status 07/07/2019 FINAL  Final  Respiratory Panel by RT PCR (Flu A&B, Covid) - Nasopharyngeal Swab     Status: None   Collection Time: 07/04/19  2:09 AM   Specimen: Nasopharyngeal Swab  Result Value Ref Range Status   SARS Coronavirus 2 by RT PCR NEGATIVE NEGATIVE Final    Comment: (NOTE) SARS-CoV-2 target nucleic acids are NOT DETECTED. The SARS-CoV-2 RNA is generally detectable in upper respiratoy specimens during the acute phase of infection. The lowest concentration of SARS-CoV-2 viral copies this assay  can detect is 131 copies/mL. A negative result does not preclude SARS-Cov-2 infection and should not be used as the sole basis for treatment or other patient management decisions. A negative result may occur with  improper specimen collection/handling, submission of specimen other than nasopharyngeal swab, presence of viral mutation(s) within the areas targeted by this assay, and inadequate number of viral copies (<131 copies/mL). A negative result must be combined with clinical observations, patient history, and epidemiological information. The expected result is Negative. Fact Sheet for Patients:  PinkCheek.be Fact Sheet for Healthcare Providers:  GravelBags.it This test is not yet ap proved or cleared by the [place] FDA and  has been authorized for detection and/or diagnosis of SARS-CoV-2 by FDA under an Emergency Use Authorization (EUA). This EUA will remain  in effect (meaning this test can be used) for  the duration of the COVID-19 declaration under Section 564(b)(1) of the Act, 21 U.S.C. section 360bbb-3(b)(1), unless the authorization is terminated or revoked sooner.    Influenza A by PCR NEGATIVE NEGATIVE Final   Influenza B by PCR NEGATIVE NEGATIVE Final    Comment: (NOTE) The Xpert Xpress SARS-CoV-2/FLU/RSV assay is intended as an aid in  the diagnosis of influenza from Nasopharyngeal swab specimens and  should not be used as a sole basis for treatment. Nasal washings and  aspirates are unacceptable for Xpert Xpress SARS-CoV-2/FLU/RSV  testing. Fact Sheet for Patients: https://www.moore.com/ Fact Sheet for Healthcare Providers: https://www.young.biz/ This test is not yet approved or cleared by the [place] FDA and  has been authorized for detection and/or diagnosis of SARS-CoV-2 by  FDA under an Emergency Use Authorization (EUA). This EUA will remain  in effect  (meaning this test can be used) for the duration of the  Covid-19 declaration under Section 564(b)(1) of the Act, 21  U.S.C. section 360bbb-3(b)(1), unless the authorization is  terminated or revoked. Performed at [facility], [address] [place], [place] [address]   C difficile quick scan w PCR reflex     Status: None   Collection Time: 07/05/19  9:36 AM   Specimen: STOOL  Result Value Ref Range Status   C Diff antigen NEGATIVE NEGATIVE Final   C Diff toxin NEGATIVE NEGATIVE Final   C Diff interpretation No C. difficile detected.  Final    Comment: Performed at [facility], [address], [place], [place] [address]     Studies: CT ABDOMEN PELVIS W CONTRAST  Result Date: 07/06/2019 CLINICAL DATA:  Possible pneumoperitoneum. EXAM: CT ABDOMEN AND PELVIS WITH CONTRAST TECHNIQUE: Multidetector CT imaging of the abdomen and pelvis was performed using the standard protocol following bolus administration of intravenous contrast. CONTRAST:  OMNIPAQUE IOHEXOL 300 MG/ML  SOLN COMPARISON:  July 04, 2019. FINDINGS: Lower chest: Small right pleural effusion is noted. Hepatobiliary: No focal liver abnormality is seen. Status post cholecystectomy. No biliary dilatation. Pancreas: Unremarkable. No pancreatic ductal dilatation or surrounding inflammatory changes. Spleen: Normal in size without focal abnormality. Adrenals/Urinary Tract: Adrenal glands appear normal. Nonobstructive left nephrolithiasis is noted. No hydronephrosis or renal obstruction is noted. Urinary bladder is decompressed secondary to Foley catheter. Stomach/Bowel: Stomach is unremarkable. The appendix appears normal. There remains significant distension of the large and small bowel. There is now noted wall thickening of the rectum concerning for possible inflammation. Vascular/Lymphatic: No significant vascular findings are present. No enlarged abdominal or pelvic lymph nodes. Reproductive: Prostatic brachytherapy seed placement is  noted. Other: There is continued presence of pneumoperitoneum concerning for rupture of hollow viscus. This was noted on prior exam. Musculoskeletal: No acute or significant osseous findings. IMPRESSION: 1. Continued presence of pneumoperitoneum concerning for rupture of hollow viscus. 2. Small right pleural effusion is noted. 3. Nonobstructive left nephrolithiasis. 4. Persistent significant distension of the large and small bowel is noted concerning for ileus. There is now noted wall thickening of the rectum concerning for possible inflammation. Electronically Signed   By: Lupita Raider M.D.   On: 07/06/2019 13:41   Scheduled Meds: . bisacodyl  10 mg Rectal Daily  . Chlorhexidine Gluconate Cloth  6 each Topical Daily   Continuous Infusions: . dextrose 5 % and 0.9% NaCl Stopped (07/06/19 1215)  . piperacillin-tazobactam (ZOSYN)  IV 3.375 g (07/07/19 1411)    Principal Problem:   Pneumoperitoneum Active Problems:   Diabetes mellitus type 2, noninsulin dependent (HCC)  Intellectual disability   Hyperlipidemia   HTN (hypertension)   BPH (benign prostatic hyperplasia)   Hypomagnesemia   Chronic constipation   Intra-abdominal free air of unknown etiology   Cystitis   Palliative care by specialist  Time spent:   Shon Hale, MD [facility] 07/07/2019, 2:30 PM    LOS: 3 days  1.

## 2019-07-08 DIAGNOSIS — K668 Other specified disorders of peritoneum: Secondary | ICD-10-CM | POA: Diagnosis not present

## 2019-07-08 LAB — GLUCOSE, CAPILLARY
Glucose-Capillary: 108 mg/dL — ABNORMAL HIGH (ref 70–99)
Glucose-Capillary: 130 mg/dL — ABNORMAL HIGH (ref 70–99)
Glucose-Capillary: 149 mg/dL — ABNORMAL HIGH (ref 70–99)
Glucose-Capillary: 160 mg/dL — ABNORMAL HIGH (ref 70–99)

## 2019-07-08 MED ORDER — BISACODYL 10 MG RE SUPP: 10.0000 mg | Freq: Every day | RECTAL | 0 refills | Status: DC | PRN

## 2019-07-08 NOTE — TOC Transition Note (Signed)
Transition of Care [facility]) - CM/SW Discharge Note   Patient Details  Name: [name] MRN: [id number] Date of Birth: 1950/10/25  Transition of Care [facility]) CM/SW Contact:  Rocio Roam, Chrystine Oiler, RN Phone Number: 07/08/2019, 1:01 PM   Clinical Narrative: Patient discharging home with family today. Discussed OP palliative referral, [name] declines referral at this time. She is agreeable to returning home with resumption of home health RN and PT with [facility].       Final next level of care: Home w Home Health Services Barriers to Discharge: Barriers Resolved                 DME Agency: NA                  Social Determinants of Health (SDOH) Interventions     Readmission Risk Interventions Readmission Risk Prevention Plan 05/26/2019 09/20/2018 09/19/2018  Transportation Screening Complete Complete Complete  PCP or Specialist Appt within 3-5 Days Not Complete - -  HRI or Home Care Consult Complete - -  Social Work Consult for Recovery Care Planning/Counseling Complete - -  Palliative Care Screening Not Complete - -  Medication Review [employer]) Complete Complete Complete  PCP or Specialist appointment within 3-5 days of discharge - Complete Complete  PCP/Specialist Appt Not Complete comments - - SNF resident. Sees facility MD  HRI or Home Care Consult - Not Complete -  HRI or Home Care Consult Pt Refusal Comments - Patient is discharging to SNF for rehab -  SW Recovery Care/Counseling Consult - Complete -  Palliative Care Screening - Not Applicable -  Skilled Nursing Facility - Complete -  Some recent data might be hidden

## 2019-07-08 NOTE — Progress Notes (Signed)
[facility]  Doing well. Tolerating a diet.  BP 93/61 (BP Location: Left Arm)   Pulse 80   Temp 98 F (36.7 C) (Oral)   Resp 16   Ht 5\' 7"  (1.702 m)   Wt 56.2 kg   SpO2 98%   BMI 19.42 kg/m  NAD Normal work of breathing Distended, nontender MAE  Doing well. Tolerating a diet. Can d/c home and I will check on him on 4/6.    Future Appointments  Date Time Provider Department Center  07/21/2019 11:30 AM 09/20/2019, MD RS-RS None  09/02/2019  2:00 PM McKenzie, 09/04/2019, MD AUR-AUR None  09/29/2019 11:15 AM Rehman, 10/01/2019, MD NRE-NRE None   Joline Maxcy, MD [facility] [address] [place], [place] [phone] (office)

## 2019-07-08 NOTE — Plan of Care (Signed)

## 2019-07-08 NOTE — Progress Notes (Signed)
Subjective: Pt is a 69 year old man with a PMH significant for type 2 diabetes, gout, urolithiasis, hypertension, mitral regurgitation, urinary retention, and cholecystitis who presents to the hospital with pneumoperitoneum of unknown etiology and chronic abdominal distension. He was initially brought to the hospital by his caregiver, who noticed hematuria upon Foley insertion. Cholecystectomy and cholecystotomy were performed by Dr. Henreitta Leber on 05/25/2019. History is derived from previous notes as pt is nonverbal.  Objective: Vital signs in last 24 hours: Temp:  [97.5 F (36.4 C)-98.3 F (36.8 C)] 98 F (36.7 C) (03/24 0538) Pulse Rate:  [73-87] 80 (03/24 0538) Resp:  [16-18] 16 (03/24 0538) BP: (93-108)/(53-61) 93/61 (03/24 0538) SpO2:  [96 %-99 %] 98 % (03/24 0538) Last BM Date: 07/06/19  Intake/Output from previous day: 03/23 0701 - 03/24 0700 In: -  Out: 450 [Urine:450] Intake/Output this shift: Total I/O In: 320 [P.O.:320] Out: -   General appearance: cooperative and no distress. Pt is nonverbal but responsive to commands Resp: Lung exam was difficult to perform as pt could not sit upright. Normal lung sounds were auscultated minimally through the anterior chest wall Cardio: regular rate and rhythm, S1, S2 normal, no murmur, click, rub or gallop GI: Abdomen is soft and non-tender but distended. Surgical scar is present midline  Lab Results:  Recent Labs    07/06/19 0446 07/07/19 0542  WBC 4.8 4.6  HGB 9.6* 10.1*  HCT 31.9* 34.7*  PLT 348 365   BMET Recent Labs    07/06/19 0446 07/07/19 0542  NA 139 138  K 3.0* 4.0  CL 104 106  CO2 28 28  GLUCOSE 169* 132*  BUN 6* 5*  CREATININE 0.74 0.73  CALCIUM 8.3* 8.3*   PT/INR No results for input(s): LABPROT, INR in the last 72 hours.  Studies/Results: CT ABDOMEN PELVIS W CONTRAST  Result Date: 07/06/2019 CLINICAL DATA:  Possible pneumoperitoneum. EXAM: CT ABDOMEN AND PELVIS WITH CONTRAST TECHNIQUE:  Multidetector CT imaging of the abdomen and pelvis was performed using the standard protocol following bolus administration of intravenous contrast. CONTRAST:  OMNIPAQUE IOHEXOL 300 MG/ML  SOLN COMPARISON:  July 04, 2019. FINDINGS: Lower chest: Small right pleural effusion is noted. Hepatobiliary: No focal liver abnormality is seen. Status post cholecystectomy. No biliary dilatation. Pancreas: Unremarkable. No pancreatic ductal dilatation or surrounding inflammatory changes. Spleen: Normal in size without focal abnormality. Adrenals/Urinary Tract: Adrenal glands appear normal. Nonobstructive left nephrolithiasis is noted. No hydronephrosis or renal obstruction is noted. Urinary bladder is decompressed secondary to Foley catheter. Stomach/Bowel: Stomach is unremarkable. The appendix appears normal. There remains significant distension of the large and small bowel. There is now noted wall thickening of the rectum concerning for possible inflammation. Vascular/Lymphatic: No significant vascular findings are present. No enlarged abdominal or pelvic lymph nodes. Reproductive: Prostatic brachytherapy seed placement is noted. Other: There is continued presence of pneumoperitoneum concerning for rupture of hollow viscus. This was noted on prior exam. Musculoskeletal: No acute or significant osseous findings. IMPRESSION: 1. Continued presence of pneumoperitoneum concerning for rupture of hollow viscus. 2. Small right pleural effusion is noted. 3. Nonobstructive left nephrolithiasis. 4. Persistent significant distension of the large and small bowel is noted concerning for ileus. There is now noted wall thickening of the rectum concerning for possible inflammation. Electronically Signed   By: Lupita Raider M.D.   On: 07/06/2019 13:41    Anti-infectives: Anti-infectives (From admission, onward)   Start     Dose/Rate Route Frequency Ordered Stop   07/04/19 1000  piperacillin-tazobactam (ZOSYN) IVPB 3.375 g      3.375 g 12.5 mL/hr over 240 Minutes Intravenous Every 8 hours 07/04/19 0209     07/04/19 0215  piperacillin-tazobactam (ZOSYN) IVPB 3.375 g     3.375 g 100 mL/hr over 30 Minutes Intravenous  Once 07/04/19 0202 07/04/19 0244   07/04/19 0015  cefTRIAXone (ROCEPHIN) 1 g in sodium chloride 0.9 % 100 mL IVPB     1 g 200 mL/hr over 30 Minutes Intravenous  Once 07/04/19 0012 07/04/19 0118      Assessment/Plan: s/p  Assessment: Pt is a 69 year old man with a PMH of cholelithiasis, pneumoperitoneum, and chronic abdominal distension who appears to be doing well post-cholecystectomy. He is tolerating an advance in his diet. Plan: Advance diet. Administer IV antibiotics and pain medication as needed. Continue monitoring pt for abdominal issues. Pt has been seen by PT to prepare for transition back home   LOS: 4 days    Sheryle Spray 07/08/2019

## 2019-07-08 NOTE — Evaluation (Signed)
Physical Therapy Evaluation Patient Details Name: [name] MRN: [id number] DOB: 1951-03-16 Today's Date: 07/08/2019   History of Present Illness  [name] is a 69 y.o. male with medical history significant of type 2 diabetes, gout, urolithiasis, hypertension, MR, urinary retention, admitted on May 23, 2019 due to pneumatosis intestinalis, chronic cholecystitis, megacolon, SBO and pneumoperitoneum of unknown etiology with abdominal distention.  He underwent open cholecystectomy with cholecystostomy tube placement by Dr. Constance Haw on 05/25/2019.  He has also had multiple surgical procedures in the past as described below.  He is brought in tonight by his caregiver who noticed hematuria after hearing "a pop" with Foley insertion.  He has been having chronic abdominal distention as well.  There has been no changes in mentation from baseline, fever, nausea, vomiting, melena or hematochezia.  His last bowel movement was yesterday and was loose.  The patient is nonverbal he is unable to provide further information.    Clinical Impression  Patient functioning near baseline for functional mobility and gait, tends to ambulate with RW placed to far in front with frequent bumping into nearby objects requiring assistance to step closer to RW and avoiding objects, limited secondary to fatigue and tolerated sitting up in chair after therapy to eat lunch with his family member assisting.  Plan:  Patient to be discharged home today and discharged from physical therapy to care of nursing for ambulation daily as tolerated for length of stay.       Follow Up Recommendations Home health PT;Supervision for mobility/OOB;Supervision - Intermittent    Equipment Recommendations  None recommended by PT    Recommendations for Other Services       Precautions / Restrictions Precautions Precautions: Fall Restrictions Weight Bearing Restrictions: No      Mobility  Bed Mobility Overal bed mobility:  Modified Independent             General bed mobility comments: increased time, HOB raised  Transfers Overall transfer level: Needs assistance Equipment used: Rolling walker (2 wheeled) Transfers: Sit to/from [facility] Sit to Stand: Min guard Stand pivot transfers: Min guard;Min assist       General transfer comment: increased time, labored movement  Ambulation/Gait Ambulation/Gait assistance: Min assist Gait Distance (Feet): 30 Feet Assistive device: Rolling walker (2 wheeled) Gait Pattern/deviations: Decreased step length - right;Decreased step length - left;Decreased stride length;Trunk flexed;Drifts right/left Gait velocity: decreased   General Gait Details: slow labored movement with frequent bumping into nearby objects with RW, no loss of balance, limited secondary to c/o fatigue  Stairs            Wheelchair Mobility    Modified Rankin (Stroke Patients Only)       Balance Overall balance assessment: Needs assistance Sitting-balance support: Feet supported;No upper extremity supported Sitting balance-[name]: Good Sitting balance - Comments: seated at EOB   Standing balance support: During functional activity;Bilateral upper extremity supported Standing balance-[name]: Fair Standing balance comment: using RW                             Pertinent Vitals/Pain Pain Assessment: No/denies pain    Home Living Family/patient expects to be discharged to:: Private residence   Available Help at Discharge: Family;Available 24 hours/day Type of Home: Mobile home Home Access: Stairs to enter Entrance Stairs-Rails: Right;Left;Can reach both Entrance Stairs-Number of Steps: 3-4 Home Layout: One level Home Equipment: Walker - 2 wheels;Wheelchair - manual  Prior Function Level of Independence: Needs assistance;Independent with assistive device(s)   Gait / Transfers Assistance Needed: household ambulator with  RW  ADL's / Homemaking Assistance Needed: assisted by family        Hand Dominance        Extremity/Trunk Assessment   Upper Extremity Assessment Upper Extremity Assessment: Overall WFL for tasks assessed    Lower Extremity Assessment Lower Extremity Assessment: Generalized weakness    Cervical / Trunk Assessment Cervical / Trunk Assessment: Kyphotic  Communication   Communication: Expressive difficulties;Other (comment)(mostly non-verbal)  Cognition Arousal/Alertness: Awake/alert Behavior During Therapy: WFL for tasks assessed/performed Overall Cognitive Status: History of cognitive impairments - at baseline                                        General Comments      Exercises     Assessment/Plan    PT Assessment All further PT needs can be met in the next venue of care  PT Problem List Decreased strength;Decreased activity tolerance;Decreased balance;Decreased mobility       PT Treatment Interventions      PT Goals (Current goals can be found in the Care Plan section)  Acute Rehab PT Goals Patient Stated Goal: return home with family to assist PT Goal Formulation: With patient/family Time For Goal Achievement: 07/08/19 Potential to Achieve Goals: Good    Frequency     Barriers to discharge        Co-evaluation               AM-PAC PT "6 Clicks" Mobility  Outcome Measure Help needed turning from your back to your side while in a flat bed without using bedrails?: None Help needed moving from lying on your back to sitting on the side of a flat bed without using bedrails?: None Help needed moving to and from a bed to a chair (including a wheelchair)?: A Little Help needed standing up from a chair using your arms (e.g., wheelchair or bedside chair)?: A Little Help needed to walk in hospital room?: A Lot Help needed climbing 3-5 steps with a railing? : A Lot 6 Click Score: 18    End of Session   Activity Tolerance: Patient  tolerated treatment well;Patient limited by fatigue Patient left: in chair;with call bell/phone within reach;with chair alarm set Nurse Communication: Mobility status PT Visit Diagnosis: Unsteadiness on feet (R26.81);Other abnormalities of gait and mobility (R26.89);Muscle weakness (generalized) (M62.81)    Time: 6568-1275 PT Time Calculation (min) (ACUTE ONLY): 24 min   Charges:   PT Evaluation $PT Eval Moderate Complexity: 1 Mod PT Treatments $Therapeutic Activity: 23-37 mins        12:27 PM, 07/08/19 Lonell Grandchild, MPT Physical Therapist with [facility] [phone] office [phone] mobile phone

## 2019-07-16 NOTE — Discharge Summary (Signed)
[name], is a 69 y.o. male  DOB 16-Jan-1951  MRN [id number].  Admission date:  07/03/2019  Admitting Physician  Reubin Milan, MD  Discharge Date:  07/08/19  Primary MD  Jani Gravel, MD  Recommendations for primary care physician for things to follow:   Admission Diagnosis  Pneumoperitoneum [K66.8] Cystitis [N30.90] Intra-abdominal free air of unknown etiology [K66.8]   Discharge Diagnosis  Pneumoperitoneum [K66.8] Cystitis [N30.90] Intra-abdominal free air of unknown etiology [K66.8]    Principal Problem:   Pneumoperitoneum Active Problems:   Diabetes mellitus type 2, noninsulin dependent ([place])   Intellectual disability   Hyperlipidemia   HTN (hypertension)   BPH (benign prostatic hyperplasia)   Hypomagnesemia   Chronic constipation   Intra-abdominal free air of unknown etiology   Cystitis   Palliative care by specialist      Past Medical History:  Diagnosis Date  . Diabetes mellitus without complication ([place])   . History of gout   . History of kidney stones   . Hypertension   . Mentally challenged   . Urinary retention     Past Surgical History:  Procedure Laterality Date  . CHOLECYSTECTOMY N/A 05/25/2019   Procedure: OPEN CHOLECYSTECTOMY;  Surgeon: Virl Cagey, MD;  Location: [facility];  Service: General;  Laterality: N/A;  . CYSTOSCOPY W/ URETERAL STENT PLACEMENT Bilateral 06/20/2016   Procedure: CYSTOSCOPY WITH RETROGRADE PYELOGRAM/URETERAL STENT PLACEMENT;  Surgeon: Cleon Gustin, MD;  Location: [facility];  Service: Urology;  Laterality: Bilateral;  . CYSTOSCOPY WITH RETROGRADE PYELOGRAM, URETEROSCOPY AND STENT PLACEMENT Bilateral 08/20/2016   Procedure: CYSTOSCOPY WITH BILATERAL RETROGRADE PYELOGRAM, BILATERAL URETEROSCOPY,  LASER LITHOTRIPSY OF RIGHT URETERAL CALCULI, STONE BASKET EXTRACTION LEFT URETERAL CALCULI  AND BILATERAL URETERAL STENT EXCHANGE;  Surgeon:  Cleon Gustin, MD;  Location: [facility];  Service: Urology;  Laterality: Bilateral;  . CYSTOSCOPY WITH RETROGRADE PYELOGRAM, URETEROSCOPY AND STENT PLACEMENT Left 01/14/2019   Procedure: CYSTOSCOPY WITH LEFT RETROGRADE PYELOGRAM, LEFT URETEROSCOPY AND LEFT URETERAL STENT PLACEMENT;  Surgeon: Cleon Gustin, MD;  Location: [facility];  Service: Urology;  Laterality: Left;  . CYSTOSCOPY WITH URETHRAL DILATATION  01/14/2019   Procedure: CYSTOSCOPY WITH URETHRAL DILATATION;  Surgeon: Cleon Gustin, MD;  Location: [facility];  Service: Urology;;  . HOLMIUM LASER APPLICATION Bilateral 09/17/8754   Procedure: HOLMIUM LASER APPLICATION;  Surgeon: Cleon Gustin, MD;  Location: [facility];  Service: Urology;  Laterality: Bilateral;  . HOLMIUM LASER APPLICATION Left 4/33/2951   Procedure: HOLMIUM LASER APPLICATION;  Surgeon: Cleon Gustin, MD;  Location: [facility];  Service: Urology;  Laterality: Left;  . IR CATHETER TUBE CHANGE  07/04/2018  . IR EXCHANGE BILIARY DRAIN  06/09/2018  . IR EXCHANGE BILIARY DRAIN  10/20/2018  . IR EXCHANGE BILIARY DRAIN  01/12/2019  . IR EXCHANGE BILIARY DRAIN  04/07/2019  . IR PERC CHOLECYSTOSTOMY  05/24/2018  . LAPAROTOMY N/A 08/25/2018   Procedure: EXPLORATORY LAPAROTOMY reduction of volvulus;  Surgeon: Virl Cagey, MD;  Location: [facility];  Service: General;  Laterality: N/A;  . LAPAROTOMY N/A 05/25/2019   Procedure: EXPLORATORY LAPAROTOMY;  Surgeon: [name], MD;  Location: [facility];  Service: General;  Laterality: N/A;     HPI  from the history and physical done on the day of admission:    Chief Complaint: Bleeding from penis.  HPI: [name] is a 69 y.o. male with medical history significant of type 2 diabetes, gout, urolithiasis, hypertension, MR, urinary retention, admitted on May 23, 2019 due to pneumatosis intestinalis, chronic cholecystitis, megacolon, SBO and pneumoperitoneum of unknown etiology with abdominal distention.  He underwent  open cholecystectomy with cholecystostomy tube placement by Dr. Henreitta Leber on 05/25/2019.  He has also had multiple surgical procedures in the past as described below.  He is brought in tonight by his caregiver who noticed hematuria after hearing "a pop" with Foley insertion.  He has been having chronic abdominal distention as well.  There has been no changes in mentation from baseline, fever, nausea, vomiting, melena or hematochezia.  His last bowel movement was yesterday and was loose.  The patient is nonverbal he is unable to provide further information.  ED Course: Initial vital signs were temperature 98.3, pulse 107, respiration 18, blood pressure 125/63 mmHg and O2 sat 99% on room air.  The patient received a 1000 mL NS bolus, ceftriaxone 1 g IVPB, but after possible bowel perforation he was started on Zosyn for prophylaxis.  Dr. Henreitta Leber was contacted and she reviewed the case.  Please see her note for recommendations.  Urinalysis shows that the reviewed appearance with large hemoglobinuria, proteinuria 100 mg/dL, positive nitrates, moderate leukocyte esterase.  Microscopic examination of the urine shows erythrocyte Toria more than 50, leukocyturia of 11-20 and many bacteria.  CBC shows a white count of 7.0, hemoglobin 9.3 g/dL and platelets 867.  Lactic acid and lipase were normal.  CMP shows a glucose of 132 mg/dL, total protein was 3.4 g/dL and AST of 10 units/L.  All other CMP resulted values are within normal range.  Imaging:  abdominal and chest radiograph showed mild diffuse gaseous distention of bowel with apparent free air under the hemidiaphragms.  CT abdomen/pelvis was obtained confirming these findings along no evidence of cholecystitis, fluid in gallbladder fossa, left nephrolithiasis and bladder wall thickening who could be due to outlet obstruction or cystitis.  There is very low lung volumes with bibasilar atelectasis and vascular congestion.  Please see images and full radiology report  for further detail.  Please also see  Dr. Daron Offer notes detailing his conversation with Dr. Kearney Hard, who was the radiologist who read the patient's CT.     Hospital Course:  Brief Admission Hx: 68 y.o.malewith medical history significant oftype 2 diabetes, gout, urolithiasis, hypertension, MR, urinary retention, admitted on May 23, 2019 due to pneumatosis intestinalis, chronic cholecystitis, megacolon, SBO and pneumoperitoneum of unknown etiology with abdominal distention. He underwent open cholecystectomy with cholecystostomy tube placement by Dr. Henreitta Leber on 05/25/2019. He has also had multiple surgical procedures in the past as described below. He is brought in tonightby his caregiver who noticed hematuria after hearing "a pop" with Foley insertion.  Pt is DNR.    Principal Problem:   Pneumoperitoneum Active Problems:   Diabetes mellitus type 2, noninsulin dependent ([facility])   Intellectual disability   Hyperlipidemia   HTN (hypertension)   BPH (benign prostatic hyperplasia)   Hypomagnesemia   Chronic constipation   Intra-abdominal free air of unknown etiology   Cystitis   Palliative care by specialist  MDM/Assessment & Plan:   1)Intra-abdominal free air of unknown etiology - Pt has remained stable with conservative measures and supportive care.   CT with oral contrast 07/06/19 with persistent pneumoperitoneum but without definite source of perforation.  There are no further plans for surgery at this time.  He was treated with Zosyn IV.  Palliative medicine consult requested.  NG tube removed per general surgery  -Patient tolerated full liquid diet okay to advance diet as per general surgery to GI soft diet - 2)Hypoglycemia --initially required IV dextrose now hypoglycemia resolved with oral intake  3)Intellectual disability/Limited functional status  4)Disposition- Patient's family reports that at baseline, he ambulates with the assistance of a walker. Patient's  aunt/caregiver previously declined SNF rehab, sherequesteddischarge home with home health services    4)DM2-okay to restart Metformin as oral intake is improved   5)BPH-- Patient has history of urinary retention and PTA did in and out cath 3 times a day.  Previously onFlomax/finasteride  --Caregiver request that we remove Foley catheter prior to discharge as patient has a habit of pulling Foley catheters out, they will do intermittent catheterizations at home  6)Chronic constipation --- continue laxatives and suppositories    DVT prophylaxis: SCD Code Status: DNR Family Communication: caretaker updated at bedside Disposition -discharge home with caregivers  Consultants:  Surgery  Palliative medicine  Procedures:  NG placement   -NG tube removed 07/06/2019  Antimicrobials:  zosyn   Discharge Condition: Stable  Follow UP  Follow-up Information    [facility] [place].   Specialty: Urology Contact information: [phone]          Diet and Activity recommendation:  As advised  Discharge Instructions     Discharge Instructions    Diet - low sodium heart healthy   Complete by: As directed    Discharge instructions   Complete by: As directed    1)Soft Diet advised 2)Avoid ibuprofen/Advil/Aleve/Motrin/Goody Powders/Naproxen/BC powders/Meloxicam/Diclofenac/Indomethacin and other Nonsteroidal anti-inflammatory medications as these will make you more likely to bleed and can cause stomach ulcers, can also cause Kidney problems.  3) follow-up with general surgeon Dr. Henreitta Leber as advised   Increase activity slowly   Complete by: As directed        Discharge Medications     Allergies as of 07/08/2019   No Known Allergies     Medication List    TAKE these medications   acetaminophen 500 MG tablet Commonly known as: TYLENOL Take 1,000 mg by mouth every 6 (six) hours as needed for mild pain or headache.   aspirin EC 81 MG tablet Take 1  tablet (81 mg total) by mouth daily with breakfast.   atorvastatin 20 MG tablet Commonly known as: LIPITOR Take 20 mg by mouth at bedtime.   bisacodyl 10 MG suppository Commonly known as: DULCOLAX Place 1 suppository (10 mg total) rectally daily as needed for moderate constipation.   finasteride 5 MG tablet Commonly known as: PROSCAR Take 1 tablet (5 mg total) by mouth daily.   HYDROcodone-acetaminophen 5-325 MG tablet Commonly known as: Norco Take 1 tablet by mouth every 6 (six) hours as needed for moderate pain.   linaclotide 145 MCG Caps capsule Commonly known as: LINZESS Take 1 capsule (145 mcg total) by mouth daily before breakfast.   magnesium oxide 400 MG tablet Commonly known as: MAG-OX Take 1 tablet (400 mg total) by mouth daily.   metFORMIN 850 MG tablet Commonly known as: Glucophage Take 1 tablet (850 mg total) by mouth 2 (two) times  daily with a meal.   polyethylene glycol 17 g packet Commonly known as: MIRALAX / GLYCOLAX Take 17 g by mouth daily.   Potassium Chloride ER 20 MEQ Tbcr Take 20 mEq by mouth every Tuesday, Thursday, Saturday, and Sunday. 1 tab daily by mouth   senna-docusate 8.6-50 MG tablet Commonly known as: Senokot-S Take 2 tablets by mouth at bedtime.   simethicone 40 MG/0.6ML drops Commonly known as: MYLICON Take 1.2 mLs (80 mg total) by mouth 4 (four) times daily.   tamsulosin 0.4 MG Caps capsule Commonly known as: FLOMAX Take 1 capsule (0.4 mg total) by mouth every evening.       Major procedures and Radiology Reports - PLEASE review detailed and final reports for all details, in brief -    CT ABDOMEN PELVIS W CONTRAST  Result Date: 07/06/2019 CLINICAL DATA:  Possible pneumoperitoneum. EXAM: CT ABDOMEN AND PELVIS WITH CONTRAST TECHNIQUE: Multidetector CT imaging of the abdomen and pelvis was performed using the standard protocol following bolus administration of intravenous contrast. CONTRAST:  OMNIPAQUE IOHEXOL 300 MG/ML   SOLN COMPARISON:  July 04, 2019. FINDINGS: Lower chest: Small right pleural effusion is noted. Hepatobiliary: No focal liver abnormality is seen. Status post cholecystectomy. No biliary dilatation. Pancreas: Unremarkable. No pancreatic ductal dilatation or surrounding inflammatory changes. Spleen: Normal in size without focal abnormality. Adrenals/Urinary Tract: Adrenal glands appear normal. Nonobstructive left nephrolithiasis is noted. No hydronephrosis or renal obstruction is noted. Urinary bladder is decompressed secondary to Foley catheter. Stomach/Bowel: Stomach is unremarkable. The appendix appears normal. There remains significant distension of the large and small bowel. There is now noted wall thickening of the rectum concerning for possible inflammation. Vascular/Lymphatic: No significant vascular findings are present. No enlarged abdominal or pelvic lymph nodes. Reproductive: Prostatic brachytherapy seed placement is noted. Other: There is continued presence of pneumoperitoneum concerning for rupture of hollow viscus. This was noted on prior exam. Musculoskeletal: No acute or significant osseous findings. IMPRESSION: 1. Continued presence of pneumoperitoneum concerning for rupture of hollow viscus. 2. Small right pleural effusion is noted. 3. Nonobstructive left nephrolithiasis. 4. Persistent significant distension of the large and small bowel is noted concerning for ileus. There is now noted wall thickening of the rectum concerning for possible inflammation. Electronically Signed   By: Lupita Raider M.D.   On: 07/06/2019 13:41   CT ABDOMEN PELVIS W CONTRAST  Result Date: 07/04/2019 CLINICAL DATA:  Abdominal distention. Question free air on plain films EXAM: CT ABDOMEN AND PELVIS WITH CONTRAST TECHNIQUE: Multidetector CT imaging of the abdomen and pelvis was performed using the standard protocol following bolus administration of intravenous contrast. CONTRAST:  75mL OMNIPAQUE IOHEXOL 300 MG/ML  SOLN  COMPARISON:  Plain films 07/03/2019.  CT 05/23/2019. FINDINGS: Lower chest: No acute finding Hepatobiliary: Previously seen portal venous gas no longer visualized. Prior cholecystectomy, new since prior study. Small fluid collection in the gallbladder fossa measures 4.2 x 1.6 cm. Pancreas: No focal abnormality or ductal dilatation. Spleen: No focal abnormality.  Normal size. Adrenals/Urinary Tract: Multiple left renal stones again noted. No ureteral stones or hydronephrosis. Urinary bladder wall appears thickened. Foley catheter in place. Adrenal glands unremarkable. Stomach/Bowel: Markedly dilated large and small bowel diffusely. Previously seen pneumatosis no longer visualized. Exact source of free air not identified. Stomach is decompressed. Vascular/Lymphatic: No evidence of aneurysm or adenopathy. Reproductive: Radiation seeds in the region of the prostate. Other: Moderate pneumoperitoneum noted.  No free fluid. Musculoskeletal: No acute bony abnormality. IMPRESSION: Marked gaseous distention of large and  small bowel diffusely with moderate pneumoperitoneum compatible with bowel perforation. Exact source of the free air not definitively seen. Changes of cholecystectomy. Small postoperative fluid collection in the gallbladder fossa measuring up to 4.2 cm. Bladder wall thickening. This could be related to bladder outlet obstruction or cystitis. Left nephrolithiasis.  No hydronephrosis. Critical Value/emergent results were called by telephone at the time of interpretation on 07/04/2019 at 1:58 am to provider [facility] , who verbally acknowledged these results. Electronically Signed   By: Charlett NoseKevin  Dover M.D.   On: 07/04/2019 02:01   DG Chest 1V REPEAT Same Day  Result Date: 07/05/2019 CLINICAL DATA:  Nasogastric tube placement. EXAM: CHEST - 1 VIEW SAME DAY COMPARISON:  Same day. FINDINGS: Stable cardiomediastinal silhouette. Nasogastric tube is seen entering stomach with distal tip in expected position of  proximal stomach. No pneumothorax or pleural effusion is noted. Minimal bibasilar subsegmental atelectasis is noted. Free air is again noted under the right hemidiaphragm. Bony thorax is noted. IMPRESSION: Nasogastric tube tip seen in expected position of proximal stomach. Stable pneumoperitoneum is noted under right hemidiaphragm. Electronically Signed   By: Lupita RaiderJames  Green Jr M.D.   On: 07/05/2019 15:20   DG Chest Port 1 View  Addendum Date: 07/05/2019   ADDENDUM REPORT: 07/05/2019 13:23 ADDENDUM: Interval removal of a nasogastric tube. There is no evidence of a nasogastric tube on the current film. Electronically Signed   By: Emmaline KluverNancy  Ballantyne M.D.   On: 07/05/2019 13:23   Result Date: 07/05/2019 CLINICAL DATA:  PICC line placement. Pt non verbal. EXAM: PORTABLE CHEST 1 VIEW COMPARISON:  Chest radiograph 07/04/2019 FINDINGS: There is no PICC line visualized in the bilateral upper extremities. Interval removal of a nasogastric tube. Stable cardiomediastinal contours. Low lung volumes. Mild opacities the bilateral lung bases likely reflect atelectasis. No new focal opacity. No pneumothorax or significant pleural effusion. Subdiaphragmatic free air and numerous loops of distended bowel again noted. IMPRESSION: 1. No PICC line is visualized in the bilateral upper extremities. 2. Low lung volumes with bibasilar atelectasis. 3. Subdiaphragmatic free air again noted. Electronically Signed: By: Emmaline KluverNancy  Ballantyne M.D. On: 07/05/2019 13:02   DG Chest Portable 1 View  Result Date: 07/04/2019 CLINICAL DATA:  NG tube placement EXAM: PORTABLE CHEST 1 VIEW COMPARISON:  Radiograph 05/23/2019, same-day CT FINDINGS: Lung volumes are diminished with streaky basilar areas of atelectasis. Cardiomegaly is similar to prior with a calcified aorta. There is a large volume of subdiaphragmatic free air resulting in a Rigler sign outlining much of the air distended loops of bowel as well. The transesophageal tube tip terminates at  the level of the gastric antrum/duodenal bulb, well beyond the GE junction. Surgical clips in the right upper quadrant likely reflect prior cholecystectomy. IMPRESSION: Transesophageal tube tip terminates near the level of the gastric antrum/duodenal bulb. Large volume of subdiaphragmatic free air resulting in a Rigler sign outlining numerous air distended loops of bowel compatible with findings on comparison CT. Electronically Signed   By: Kreg ShropshirePrice  DeHay M.D.   On: 07/04/2019 03:14   DG ABD ACUTE 2+V W 1V CHEST  Result Date: 07/04/2019 CLINICAL DATA:  Abdominal distension EXAM: DG ABDOMEN ACUTE W/ 1V CHEST COMPARISON:  CT 05/23/2019 FINDINGS: Low lung volumes with bibasilar atelectasis. No effusions. Vascular congestion. Marked diffuse gaseous distention of bowel. Concern for free air under the hemidiaphragms bilaterally. Prior cholecystectomy. Left nephrolithiasis. IMPRESSION: Marked diffuse gaseous distention of bowel with apparent free air under the hemidiaphragms. Findings concerning for bowel perforation. Very low lung volumes  with bibasilar atelectasis and vascular congestion. These results were called by telephone at the time of interpretation on 07/04/2019 at 12:08 am to provider [employer] , who verbally acknowledged these results. Electronically Signed   By: Charlett Nose M.D.   On: 07/04/2019 00:10    Micro Results   No results found for this or any previous visit (from the past 240 hour(s)).  Today   Subjective    Sena Slate today has no new concerns  --Eating and drinking well, -Had multiple BMs          Patient has been seen and examined prior to discharge   Objective   Blood pressure (!) 90/56, pulse 70, temperature 98.3 F (36.8 C), temperature source Oral, resp. rate 19, height 5\' 7"  (1.702 m), weight 56.2 kg, SpO2 99 %.  No intake or output data in the 24 hours ending 07/16/19 2114  Exam Gen:- Awake Alert, no acute distress  HEENT:- [place].AT, No sclera  icterus Neck-Supple Neck,No JVD,.  Lungs-  CTAB , good air movement bilaterally  CV- S1, S2 normal, regular Abd-  +ve B.Sounds, Abd Soft, abdomen is chronically distended, Extremity/Skin:- No  edema,   good pulses Psych-baseline cognitive and memory deficits neuro-generalized weakness, no new focal deficits, no tremors    Data Review   CBC w Diff:  Lab Results  Component Value Date   WBC 4.6 07/07/2019   HGB 10.1 (L) 07/07/2019   HCT 34.7 (L) 07/07/2019   PLT 365 07/07/2019   LYMPHOPCT 20 07/06/2019   MONOPCT 10 07/06/2019   EOSPCT 2 07/06/2019   BASOPCT 1 07/06/2019    CMP:  Lab Results  Component Value Date   NA 138 07/07/2019   K 4.0 07/07/2019   CL 106 07/07/2019   CO2 28 07/07/2019   BUN 5 (L) 07/07/2019   CREATININE 0.73 07/07/2019   PROT 6.1 (L) 07/06/2019   ALBUMIN 2.6 (L) 07/06/2019   BILITOT 0.3 07/06/2019   ALKPHOS 58 07/06/2019   AST 9 (L) 07/06/2019   ALT 6 07/06/2019  . Total Discharge time is about 33 minutes  07/08/2019 M.D on 07/16/2019 at 9:14 PM  Go to [email or web address] -  for contact info  [facility] - Office  [phone]

## 2019-07-21 ENCOUNTER — Other Ambulatory Visit: Payer: Self-pay

## 2019-07-21 ENCOUNTER — Ambulatory Visit (INDEPENDENT_AMBULATORY_CARE_PROVIDER_SITE_OTHER): Payer: Medicare Other | Admitting: General Surgery

## 2019-07-21 ENCOUNTER — Encounter: Payer: Self-pay | Admitting: General Surgery

## 2019-07-21 VITALS — BP 132/62 | HR 80 | Temp 97.5°F | Resp 14 | Ht 67.0 in | Wt 124.0 lb

## 2019-07-21 DIAGNOSIS — K668 Other specified disorders of peritoneum: Secondary | ICD-10-CM | POA: Diagnosis not present

## 2019-07-21 NOTE — Progress Notes (Signed)
[facility] Note   HPI:  69 y.o. Male presents to clinic for follow-up evaluation after being admitted to the hospital with pneumoperitoneum of unknown origin or significant. He was monitored and ultimately started on diet. He has been doing well and has been eating and drinking. He is distended per his normal. He is having BMs.   Review of Systems:  No fever or chills No new abdominal pain Normal distention All other review of systems: otherwise negative   Vital Signs:  BP 132/62   Pulse 80   Temp (!) 97.5 F (36.4 C) (Oral)   Resp 14   Ht 5\' 7"  (1.702 m)   Wt 124 lb (56.2 kg)   SpO2 99%   BMI 19.42 kg/m    Physical Exam:  Physical Exam Vitals reviewed.  HENT:     Head: Normocephalic.  Cardiovascular:     Rate and Rhythm: Normal rate.  Pulmonary:     Effort: Pulmonary effort is normal.  Abdominal:     General: There is distension.     Palpations: Abdomen is soft.     Tenderness: There is no abdominal tenderness.  Musculoskeletal:        General: No swelling. Normal range of motion.  Neurological:     Mental Status: He is alert.     Assessment:  69 y.o. yo Male with an extensive surgical history in the last year, mental handicap, and severe bowel dysmotility with prior pneumatosis and most recently pneumoperitoneum of unknown origin or timing. He did fine with conservative management. During his hospitalization, I asked palliative to discuss with 69 his goals of care and he was made a DNR due to the fact that he will continue to have these issues and problems related to his bowel and surgery is only going to further cause morbidity and pain for him.   Plan:  - Diet as tolerated  - Keep stools regular  - Simethicone as needed for gas/ bloating - Follow up with GI - No plans for surgical intervention in the future, and patient should remain a DNR per [name]'s wishes, as she would want him comfortable rather than proceeding with surgery   -  Follow up PRN   All of the above recommendations were discussed with the patient and patient's family, and all of patient's and family's questions were answered to their expressed satisfaction.  [name], MD [facility] [address] [place], [place] [phone] (office)

## 2019-07-21 NOTE — Patient Instructions (Signed)
Take simethicone (gas X)  as needed for gas and bloating.

## 2019-08-10 ENCOUNTER — Ambulatory Visit: Payer: Medicare Other | Admitting: Urology

## 2019-08-31 ENCOUNTER — Other Ambulatory Visit: Payer: Self-pay

## 2019-08-31 ENCOUNTER — Ambulatory Visit (HOSPITAL_COMMUNITY)
Admission: RE | Admit: 2019-08-31 | Discharge: 2019-08-31 | Disposition: A | Discharge status: Home / Self Care | Payer: Medicare Other | Source: Ambulatory Visit | Attending: Urology | Admitting: Urology

## 2019-08-31 DIAGNOSIS — N200 Calculus of kidney: Secondary | ICD-10-CM | POA: Diagnosis not present

## 2019-09-02 ENCOUNTER — Ambulatory Visit (INDEPENDENT_AMBULATORY_CARE_PROVIDER_SITE_OTHER): Payer: Medicare Other | Admitting: Urology

## 2019-09-02 ENCOUNTER — Other Ambulatory Visit: Payer: Self-pay

## 2019-09-02 ENCOUNTER — Encounter: Payer: Self-pay | Admitting: Urology

## 2019-09-02 VITALS — BP 109/70 | HR 72 | Temp 97.7°F | Ht 67.0 in | Wt 124.0 lb

## 2019-09-02 DIAGNOSIS — N200 Calculus of kidney: Secondary | ICD-10-CM

## 2019-09-02 NOTE — Patient Instructions (Signed)

## 2019-09-02 NOTE — Progress Notes (Signed)
09/02/2019 2:23 PM   Westley Chandler Jun 08, 1950 [id number]  Referring provider: Jani Gravel, MD [address],  [place] [address]  Nephrolithiasis  HPI: Mr Zenon is a 69yo here for followup for nephrolithiasis. He has intermittent left flank pain. No stone events since alst visit. Renal US from 5/17 shows stable 1.6cm left renal calculus. No hematuria. NO recent UTI. SInce last visit he was admitted twice for cholecystitis and then free air under diaphram.  His records from [facility] are as follows: 07/12/2016: s/p b/l stent placemen for b/l ureteral calculi and sepsis 2 weeks ago. mild flank pain with stent in place. no fevers. He has an extensive hx of nephrolithiasis and was previously followup by Dr. Burman Foster   08/24/2016: He is s/p B/L URS.   10/03/2016: US shows no hydornephrosis, no calculi, high PVR of 478   12/31/2018: He went to the ER with nausea/vomiting and was diagnosed with increase left renal stone burden and bladder calculi. no flank pain currently.   01/21/2019: s/p URS. intermittent hematuria. no other LUTS   11/18/20920: Renal US shows no hydronephrosis. he has a 1.4cm left renal calculus      PMH: Past Medical History:  Diagnosis Date  . Diabetes mellitus without complication ([place])   . History of gout   . History of kidney stones   . Hypertension   . Mentally challenged   . Urinary retention     Surgical History: Past Surgical History:  Procedure Laterality Date  . CHOLECYSTECTOMY N/A 05/25/2019   Procedure: OPEN CHOLECYSTECTOMY;  Surgeon: Virl Cagey, MD;  Location: [facility];  Service: General;  Laterality: N/A;  . CYSTOSCOPY W/ URETERAL STENT PLACEMENT Bilateral 06/20/2016   Procedure: CYSTOSCOPY WITH RETROGRADE PYELOGRAM/URETERAL STENT PLACEMENT;  Surgeon: Cleon Gustin, MD;  Location: [facility];  Service: Urology;  Laterality: Bilateral;  . CYSTOSCOPY WITH RETROGRADE PYELOGRAM, URETEROSCOPY AND STENT PLACEMENT Bilateral  08/20/2016   Procedure: CYSTOSCOPY WITH BILATERAL RETROGRADE PYELOGRAM, BILATERAL URETEROSCOPY,  LASER LITHOTRIPSY OF RIGHT URETERAL CALCULI, STONE BASKET EXTRACTION LEFT URETERAL CALCULI  AND BILATERAL URETERAL STENT EXCHANGE;  Surgeon: Cleon Gustin, MD;  Location: [facility];  Service: Urology;  Laterality: Bilateral;  . CYSTOSCOPY WITH RETROGRADE PYELOGRAM, URETEROSCOPY AND STENT PLACEMENT Left 01/14/2019   Procedure: CYSTOSCOPY WITH LEFT RETROGRADE PYELOGRAM, LEFT URETEROSCOPY AND LEFT URETERAL STENT PLACEMENT;  Surgeon: Cleon Gustin, MD;  Location: [facility];  Service: Urology;  Laterality: Left;  . CYSTOSCOPY WITH URETHRAL DILATATION  01/14/2019   Procedure: CYSTOSCOPY WITH URETHRAL DILATATION;  Surgeon: Cleon Gustin, MD;  Location: [facility];  Service: Urology;;  . HOLMIUM LASER APPLICATION Bilateral 0/06/5007   Procedure: HOLMIUM LASER APPLICATION;  Surgeon: Cleon Gustin, MD;  Location: [facility];  Service: Urology;  Laterality: Bilateral;  . HOLMIUM LASER APPLICATION Left 3/81/8299   Procedure: HOLMIUM LASER APPLICATION;  Surgeon: Cleon Gustin, MD;  Location: [facility];  Service: Urology;  Laterality: Left;  . IR CATHETER TUBE CHANGE  07/04/2018  . IR EXCHANGE BILIARY DRAIN  06/09/2018  . IR EXCHANGE BILIARY DRAIN  10/20/2018  . IR EXCHANGE BILIARY DRAIN  01/12/2019  . IR EXCHANGE BILIARY DRAIN  04/07/2019  . IR PERC CHOLECYSTOSTOMY  05/24/2018  . LAPAROTOMY N/A 08/25/2018   Procedure: EXPLORATORY LAPAROTOMY reduction of volvulus;  Surgeon: Virl Cagey, MD;  Location: [facility];  Service: General;  Laterality: N/A;  . LAPAROTOMY N/A 05/25/2019   Procedure: EXPLORATORY LAPAROTOMY;  Surgeon: Virl Cagey, MD;  Location:  [facility];  Service: General;  Laterality: N/A;    Home Medications:  Allergies as of 09/02/2019   No Known Allergies     Medication List       Accurate as of Sep 02, 2019  2:23 PM. If you have any questions, ask your nurse or doctor.         acetaminophen 500 MG tablet Commonly known as: TYLENOL Take 1,000 mg by mouth every 6 (six) hours as needed for mild pain or headache.   aspirin EC 81 MG tablet Take 1 tablet (81 mg total) by mouth daily with breakfast.   atorvastatin 20 MG tablet Commonly known as: LIPITOR Take 20 mg by mouth at bedtime.   bisacodyl 10 MG suppository Commonly known as: DULCOLAX Place 1 suppository (10 mg total) rectally daily as needed for moderate constipation.   finasteride 5 MG tablet Commonly known as: PROSCAR Take 1 tablet (5 mg total) by mouth daily.   HYDROcodone-acetaminophen 5-325 MG tablet Commonly known as: Norco Take 1 tablet by mouth every 6 (six) hours as needed for moderate pain.   linaclotide 145 MCG Caps capsule Commonly known as: LINZESS Take 1 capsule (145 mcg total) by mouth daily before breakfast.   magnesium oxide 400 MG tablet Commonly known as: MAG-OX Take 1 tablet (400 mg total) by mouth daily.   metFORMIN 850 MG tablet Commonly known as: Glucophage Take 1 tablet (850 mg total) by mouth 2 (two) times daily with a meal.   polyethylene glycol 17 g packet Commonly known as: MIRALAX / GLYCOLAX Take 17 g by mouth daily.   Potassium Chloride ER 20 MEQ Tbcr Take 20 mEq by mouth every Tuesday, Thursday, Saturday, and Sunday. 1 tab daily by mouth   senna-docusate 8.6-50 MG tablet Commonly known as: Senokot-S Take 2 tablets by mouth at bedtime.   simethicone 40 MG/0.6ML drops Commonly known as: MYLICON Take 1.2 mLs (80 mg total) by mouth 4 (four) times daily.   tamsulosin 0.4 MG Caps capsule Commonly known as: FLOMAX Take 1 capsule (0.4 mg total) by mouth every evening.       Allergies: No Known Allergies  Family History: Family History  Problem Relation Age of Onset  . Gallbladder disease Other     Social History:  reports that he has never smoked. He has never used smokeless tobacco. He reports that he does not drink alcohol or use drugs.  ROS:  All other review of systems were reviewed and are negative except what is noted above in HPI  Physical Exam: BP 109/70   Pulse 72   Temp 97.7 F (36.5 C)   Ht 5\' 7"  (1.702 m)   Wt 124 lb (56.2 kg)   BMI 19.42 kg/m   Constitutional:  Alert and oriented, No acute distress. HEENT: [place] AT, moist mucus membranes.  Trachea midline, no masses. Cardiovascular: No clubbing, cyanosis, or edema. Respiratory: Normal respiratory effort, no increased work of breathing. GI: Abdomen is soft, nontender, nondistended, no abdominal masses GU: No CVA tenderness.  Lymph: No cervical or inguinal lymphadenopathy. Skin: No rashes, bruises or suspicious lesions. Neurologic: Grossly intact, no focal deficits, moving all 4 extremities. Psychiatric: Normal mood and affect.  Laboratory Data: Lab Results  Component Value Date   WBC 4.6 07/07/2019   HGB 10.1 (L) 07/07/2019   HCT 34.7 (L) 07/07/2019   MCV 84.8 07/07/2019   PLT 365 07/07/2019    Lab Results  Component Value Date   CREATININE 0.73 07/07/2019    Lab  Results  Component Value Date   PSA 0.67 05/07/2014    No results found for: TESTOSTERONE  Lab Results  Component Value Date   HGBA1C 7.4 (H) 05/23/2019    Urinalysis    Component Value Date/Time   COLORURINE YELLOW 07/03/2019 2240   APPEARANCEUR TURBID (A) 07/03/2019 2240   LABSPEC 1.018 07/03/2019 2240   PHURINE 5.0 07/03/2019 2240   GLUCOSEU NEGATIVE 07/03/2019 2240   HGBUR LARGE (A) 07/03/2019 2240   BILIRUBINUR NEGATIVE 07/03/2019 2240   KETONESUR NEGATIVE 07/03/2019 2240   PROTEINUR 100 (A) 07/03/2019 2240   UROBILINOGEN 1.0 07/20/2014 1718   NITRITE POSITIVE (A) 07/03/2019 2240   LEUKOCYTESUR MODERATE (A) 07/03/2019 2240    Lab Results  Component Value Date   BACTERIA MANY (A) 07/03/2019    Pertinent Imaging: Renal US 08/31/2019: Images reviewed and discussed with the patient Results for orders placed during the hospital encounter of 09/26/18  DG Abd 1 View    Narrative CLINICAL DATA:  69 year old male with ileus.  EXAM: ABDOMEN - 1 VIEW  COMPARISON:  10/20/2018 and earlier.  FINDINGS: AP supine views at 0936 hours. Cholecystostomy tube remains in place. Negative visible lung bases. Unchanged diffuse gaseous distension of bowel since yesterday, and not significantly changed since the CT on 09/26/2018-aside from interval clearance of large bowel barium contrast seen at that time. Stable pelvic surgical clips and phleboliths. Bulky left nephrolithiasis redemonstrated. No acute osseous abnormality identified.  IMPRESSION: 1. Stable widespread gas distended bowel loops as described on the CT 09/26/2018. 2. Bulky left nephrolithiasis. 3. Stable cholecystostomy tube.   Electronically Signed   By: Odessa Fleming M.D.   On: 10/21/2018 09:55    No results found for this or any previous visit. No results found for this or any previous visit. No results found for this or any previous visit. Results for orders placed during the hospital encounter of 08/31/19  Ultrasound renal complete   Narrative CLINICAL DATA:  Renal calculus.  EXAM: RENAL / URINARY TRACT ULTRASOUND COMPLETE  COMPARISON:  CT abdomen pelvis 07/06/2019  FINDINGS: Right Kidney:  Renal measurements: 10.1 x 5.5 x 5.3 cm = volume: 157 mL . Echogenicity within normal limits. No mass or hydronephrosis visualized.  Left Kidney:  Renal measurements: 10.0 x 4.1 x 4.3 cm = volume: 94 mL. Negative for obstruction. Multiple left renal calculi. The largest calculus left lower pole measures 16 mm as noted on recent CT.  Bladder:  Mild bladder wall thickening.  No ureteral jet identified.  Prevoid bladder volume 132 cc.  Postvoid volume not obtained.  Other:  None.  IMPRESSION: Left renal calculi.  Largest calculus left lower pole 16 mm  Negative for hydronephrosis.   Electronically Signed   By: Marlan Palau M.D.   On: 08/31/2019 14:22    No results found for this  or any previous visit. No results found for this or any previous visit. No results found for this or any previous visit.  Assessment & Plan:    1. Renal calculus -continue observation -RTC 6 months with renal US   No follow-ups on file.  Wilkie Aye, MD  [facility] [place]

## 2019-09-02 NOTE — Progress Notes (Signed)

## 2019-09-29 ENCOUNTER — Other Ambulatory Visit: Payer: Self-pay

## 2019-09-29 ENCOUNTER — Ambulatory Visit (INDEPENDENT_AMBULATORY_CARE_PROVIDER_SITE_OTHER): Payer: Medicare Other | Admitting: Internal Medicine

## 2019-09-29 ENCOUNTER — Encounter (INDEPENDENT_AMBULATORY_CARE_PROVIDER_SITE_OTHER): Payer: Self-pay | Admitting: Internal Medicine

## 2019-09-29 VITALS — BP 102/67 | HR 69 | Temp 97.4°F | Ht 67.0 in | Wt 142.8 lb

## 2019-09-29 DIAGNOSIS — K5939 Other megacolon: Secondary | ICD-10-CM | POA: Diagnosis not present

## 2019-09-29 DIAGNOSIS — K5909 Other constipation: Secondary | ICD-10-CM

## 2019-09-29 NOTE — Progress Notes (Signed)
Presenting complaint;  Follow-up for chronic constipation and megacolon.  Database and subjective:  Pa.  Tient is 69 year old Afro-American male who is here for scheduled visit accompanied by his aunt Ms. [name] Long who is also his caretaker.  He was last seen in December 2020.  We initially saw him when he was hospitalized for ileus back in May 2020 for acute cholecystitis treated with cholecystostomy. Since his last visit he has been hospitalized twice once for hematuria and any underwent cholecystectomy followed by ileus which was in February 2021.  His aunt states he is doing much better.  His appetite is improved.  Since his last visit 6 months ago he has lost 9 pounds however few weeks ago his weight was down to 124 pounds.  His weight has come since he has been eating better.  He is having at least 2 bowel movements per week.  Suppository is being used on as-needed basis and it always works.  She is encouraging patient to use commode.  He moves around with help of walker.  She says most of the time he passes long pieces of stool.  No melena or rectal bleeding.  He has not had any episode of nausea or vomiting recently.  He did have heaving several weeks ago and she given salt and he got better. She also is using simethicone which has helped with his flatulence.  Review of his records revealed that he had CT on 07/06/2019 which revealed pneumoperitoneum.  He was seen by Dr. Henreitta Leber.  Was felt that his pneumoperitoneum was not due to perforation.  He has had pneumatosis in the past as well.   Current Medications: Outpatient Encounter Medications as of 09/29/2019  Medication Sig  . acetaminophen (TYLENOL) 500 MG tablet Take 1,000 mg by mouth every 6 (six) hours as needed for mild pain or headache.  Marland Kitchen aspirin EC 81 MG tablet Take 1 tablet (81 mg total) by mouth daily with breakfast.  . atorvastatin (LIPITOR) 20 MG tablet Take 20 mg by mouth at bedtime.  . bisacodyl (DULCOLAX) 10 MG suppository  Place 1 suppository (10 mg total) rectally daily as needed for moderate constipation.  . finasteride (PROSCAR) 5 MG tablet Take 1 tablet (5 mg total) by mouth daily.  Marland Kitchen linaclotide (LINZESS) 145 MCG CAPS capsule Take 1 capsule (145 mcg total) by mouth daily before breakfast.  . magnesium oxide (MAG-OX) 400 MG tablet Take 1 tablet (400 mg total) by mouth daily.  . metFORMIN (GLUCOPHAGE) 850 MG tablet Take 1 tablet (850 mg total) by mouth 2 (two) times daily with a meal.  . polyethylene glycol (MIRALAX / GLYCOLAX) 17 g packet Take 17 g by mouth daily.  . Potassium Chloride ER 20 MEQ TBCR Take 20 mEq by mouth every Tuesday, Thursday, Saturday, and Sunday. 1 tab daily by mouth  . senna-docusate (SENOKOT-S) 8.6-50 MG tablet Take 2 tablets by mouth at bedtime.  . Simethicone (GAS-X PO) Take by mouth. Chewable - after each meal.  . simethicone (MYLICON) 40 MG/0.6ML drops Take 1.2 mLs (80 mg total) by mouth 4 (four) times daily.  . tamsulosin (FLOMAX) 0.4 MG CAPS capsule Take 1 capsule (0.4 mg total) by mouth every evening.  . [DISCONTINUED] HYDROcodone-acetaminophen (NORCO) 5-325 MG tablet Take 1 tablet by mouth every 6 (six) hours as needed for moderate pain. (Patient not taking: Reported on 09/02/2019)   No facility-administered encounter medications on file as of 09/29/2019.     Objective: Blood pressure 102/67, pulse 69, temperature (!) 97.4  F (36.3 C), temperature source Oral, height 5\' 7"  (1.702 m), weight 142 lb 12.8 oz (64.8 kg). Patient is alert. He is aphasic but he does respond to questions by nodding his head and following commands. Conjunctiva is pink. Sclera is nonicteric Oropharyngeal mucosa is normal. No neck masses or thyromegaly noted. Cardiac exam with regular rhythm normal S1 and S2. No murmur or gallop noted. Lungs are clear to auscultation. Abdomen is abdomen is full.  Bowel sounds are normal.  On palpation is very soft.  Percussion note is sympathetic. No LE edema or  clubbing noted.   Assessment:  #1.  Chronic constipation/megacolon.  He is doing well with therapy. Our goal is for him to have at least 3 bowel movements a week.  Dulcolax suppository can be used more often if necessary.  Overall I am very pleased with his abdominal examination today.  On his last visit 6 months ago his abdomen was distended and somewhat tense but that is not the case today.  #2.  History of pneumatosis and pneumoperitoneum.  Last CT was on 07/06/2019.  No indication for repeat CT at this time.   Plan:  Patient will continue Linzess/linaclotide at a dose of 154 mcg every day. Use Dulcolax suppository on as-needed basis or every other day. Encourage patient to use bathroom/commode at least twice a day. Office visit in 6 months.

## 2019-09-29 NOTE — Patient Instructions (Signed)
Can use suppository more often or daily if needed

## 2019-11-10 ENCOUNTER — Other Ambulatory Visit: Payer: Self-pay

## 2019-11-10 ENCOUNTER — Encounter (HOSPITAL_COMMUNITY): Payer: Self-pay | Admitting: Emergency Medicine

## 2019-11-10 DIAGNOSIS — Z79899 Other long term (current) drug therapy: Secondary | ICD-10-CM | POA: Insufficient documentation

## 2019-11-10 DIAGNOSIS — I10 Essential (primary) hypertension: Secondary | ICD-10-CM | POA: Diagnosis not present

## 2019-11-10 DIAGNOSIS — E119 Type 2 diabetes mellitus without complications: Secondary | ICD-10-CM | POA: Diagnosis not present

## 2019-11-10 DIAGNOSIS — Z7982 Long term (current) use of aspirin: Secondary | ICD-10-CM | POA: Diagnosis not present

## 2019-11-10 DIAGNOSIS — R310 Gross hematuria: Secondary | ICD-10-CM | POA: Insufficient documentation

## 2019-11-10 DIAGNOSIS — R319 Hematuria, unspecified: Secondary | ICD-10-CM | POA: Diagnosis present

## 2019-11-10 DIAGNOSIS — Z7984 Long term (current) use of oral hypoglycemic drugs: Secondary | ICD-10-CM | POA: Insufficient documentation

## 2019-11-10 NOTE — ED Triage Notes (Signed)
Pts Aunt states she was using an in and out catheter on pt tonight and noticed blood leaking around the tube.

## 2019-11-11 ENCOUNTER — Emergency Department (HOSPITAL_COMMUNITY): Payer: Medicare Other

## 2019-11-11 ENCOUNTER — Emergency Department (HOSPITAL_COMMUNITY)
Admission: EM | Admit: 2019-11-11 | Discharge: 2019-11-11 | Disposition: A | Discharge status: Home / Self Care | Payer: Medicare Other | Attending: Emergency Medicine | Admitting: Emergency Medicine

## 2019-11-11 DIAGNOSIS — R310 Gross hematuria: Secondary | ICD-10-CM

## 2019-11-11 NOTE — ED Notes (Signed)
Family/caregiver refused foley stating "he will pull it out, even with mitts on." EDP made aware.

## 2019-11-11 NOTE — ED Provider Notes (Signed)
[facility] EMERGENCY DEPARTMENT Provider Note   CSN: [id number] Arrival date & time: 11/10/19  2248   Time seen 3:10 AM  History Chief Complaint  Patient presents with  . Hematuria   Level 5 caveat for nonverbal patient  [name] is a 69 y.o. male.  HPI   Patient is here with his aunt.  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  He has been taking care of his family his whole life.  She states she does in and out catheterizations twice a day.  She states tonight about 8 PM when she did it she started pushing the catheter and it was going in a smoothly is normal and she started seeing blood coming out around the catheter.  She states this happened before in March and she states that he was admitted.  Patient states he does not tolerate a Foley catheter because he pulls them out.  He has not indicated he is having any abdominal pain.  PCP Pearson Grippe, MD   Past Medical History:  Diagnosis Date  . Diabetes mellitus without complication (HCC)   . History of gout   . History of kidney stones   . Hypertension   . Mentally challenged   . Urinary retention     Patient Active Problem List   Diagnosis Date Noted  . Palliative care by specialist   . Pneumoperitoneum 07/04/2019  . Intra-abdominal free air of unknown etiology 07/04/2019  . Cystitis   . Chronic cholecystitis 05/21/2019  . Chronic constipation 03/31/2019  . Cholecystostomy care (HCC) 11/04/2018  . Acute lower UTI 09/18/2018  . Abdominal distention 09/18/2018  . Hypomagnesemia 09/09/2018  . Protein-calorie malnutrition, severe (HCC) 09/09/2018  . Acute blood loss anemia 09/09/2018  . Gangrenous cholecystitis 09/05/2018  . Volvulus (HCC) 09/03/2018  . Ileus (HCC)   . Pneumatosis intestinalis   . Gall bladder disease 08/25/2018  . Small bowel ischemia (HCC)   . Acute gangrenous cholecystitis 05/24/2018  . SBO (small bowel obstruction) (HCC) 05/24/2018  . Abdominal  distension   . Pressure injury of skin 04/09/2018  . C. difficile diarrhea 04/08/2018  . Bilateral hydronephrosis 04/08/2018  . Pneumoperitoneum of unknown etiology   . Diarrhea 04/07/2018  . Nonverbal 04/07/2018  . Hydronephrosis with urinary obstruction due to ureteral calculus 06/20/2016  . Sepsis secondary to UTI (HCC) 06/20/2016  . Fever 06/19/2016  . Generalized weakness 06/19/2016  . Diabetes mellitus type 2, noninsulin dependent (HCC) 06/19/2016  . Severe sepsis with septic shock (HCC) 06/19/2016  . UTI (urinary tract infection) 06/19/2016  . Hematuria 06/19/2016  . AKI (acute kidney injury) (HCC) 06/19/2016  . Hyponatremia 06/19/2016  . Hypokalemia 06/19/2016  . BPH (benign prostatic hyperplasia) 06/19/2016  . Megacolon 05/07/2014  . Urinary retention 05/07/2014  . Intellectual disability 05/07/2014  . Hyperlipidemia 05/07/2014  . HTN (hypertension) 05/07/2014  . Renal calculus 01/14/2012    Past Surgical History:  Procedure Laterality Date  . CHOLECYSTECTOMY N/A 05/25/2019   Procedure: OPEN CHOLECYSTECTOMY;  Surgeon: [name], MD;  Location: [facility];  Service: General;  Laterality: N/A;  . CYSTOSCOPY W/ URETERAL STENT PLACEMENT Bilateral 06/20/2016   Procedure: CYSTOSCOPY WITH RETROGRADE PYELOGRAM/URETERAL STENT PLACEMENT;  Surgeon: Malen Gauze, MD;  Location: [facility];  Service: Urology;  Laterality: Bilateral;  . CYSTOSCOPY WITH RETROGRADE PYELOGRAM, URETEROSCOPY AND STENT PLACEMENT Bilateral 08/20/2016   Procedure: CYSTOSCOPY WITH BILATERAL RETROGRADE PYELOGRAM, BILATERAL URETEROSCOPY,  LASER LITHOTRIPSY OF RIGHT URETERAL CALCULI, STONE BASKET EXTRACTION  LEFT URETERAL CALCULI  AND BILATERAL URETERAL STENT EXCHANGE;  Surgeon: Malen Gauze, MD;  Location: [facility];  Service: Urology;  Laterality: Bilateral;  . CYSTOSCOPY WITH RETROGRADE PYELOGRAM, URETEROSCOPY AND STENT PLACEMENT Left 01/14/2019   Procedure: CYSTOSCOPY WITH LEFT RETROGRADE PYELOGRAM, LEFT  URETEROSCOPY AND LEFT URETERAL STENT PLACEMENT;  Surgeon: Malen Gauze, MD;  Location: [facility];  Service: Urology;  Laterality: Left;  . CYSTOSCOPY WITH URETHRAL DILATATION  01/14/2019   Procedure: CYSTOSCOPY WITH URETHRAL DILATATION;  Surgeon: Malen Gauze, MD;  Location: [facility];  Service: Urology;;  . HOLMIUM LASER APPLICATION Bilateral 08/20/2016   Procedure: HOLMIUM LASER APPLICATION;  Surgeon: Malen Gauze, MD;  Location: [facility];  Service: Urology;  Laterality: Bilateral;  . HOLMIUM LASER APPLICATION Left 01/14/2019   Procedure: HOLMIUM LASER APPLICATION;  Surgeon: Malen Gauze, MD;  Location: [facility];  Service: Urology;  Laterality: Left;  . IR CATHETER TUBE CHANGE  07/04/2018  . IR EXCHANGE BILIARY DRAIN  06/09/2018  . IR EXCHANGE BILIARY DRAIN  10/20/2018  . IR EXCHANGE BILIARY DRAIN  01/12/2019  . IR EXCHANGE BILIARY DRAIN  04/07/2019  . IR PERC CHOLECYSTOSTOMY  05/24/2018  . LAPAROTOMY N/A 08/25/2018   Procedure: EXPLORATORY LAPAROTOMY reduction of volvulus;  Surgeon: [name], MD;  Location: [facility];  Service: General;  Laterality: N/A;  . LAPAROTOMY N/A 05/25/2019   Procedure: EXPLORATORY LAPAROTOMY;  Surgeon: [name], MD;  Location: [facility];  Service: General;  Laterality: N/A;       Family History  Problem Relation Age of Onset  . Gallbladder disease Other     Social History   Tobacco Use  . Smoking status: Never Smoker  . Smokeless tobacco: Never Used  Vaping Use  . Vaping Use: Never used  Substance Use Topics  . Alcohol use: No  . Drug use: No    Home Medications Prior to Admission medications   Medication Sig Start Date End Date Taking? Authorizing Provider  acetaminophen (TYLENOL) 500 MG tablet Take 1,000 mg by mouth every 6 (six) hours as needed for mild pain or headache.    [provider]  aspirin EC 81 MG tablet Take 1 tablet (81 mg total) by mouth daily with breakfast. 05/29/19   Mariea Clonts, Courage, MD    atorvastatin (LIPITOR) 20 MG tablet Take 20 mg by mouth at bedtime. 06/09/19   [provider]  bisacodyl (DULCOLAX) 10 MG suppository Place 1 suppository (10 mg total) rectally daily as needed for moderate constipation. 07/08/19   Shon Hale, MD  finasteride (PROSCAR) 5 MG tablet Take 1 tablet (5 mg total) by mouth daily. 05/29/19   Shon Hale, MD  linaclotide Karlene Einstein) 145 MCG CAPS capsule Take 1 capsule (145 mcg total) by mouth daily before breakfast. 05/29/19   Shon Hale, MD  magnesium oxide (MAG-OX) 400 MG tablet Take 1 tablet (400 mg total) by mouth daily. 10/23/18   Sharee Holster, NP  metFORMIN (GLUCOPHAGE) 850 MG tablet Take 1 tablet (850 mg total) by mouth 2 (two) times daily with a meal. 05/29/19 05/28/20  Shon Hale, MD  Potassium Chloride ER 20 MEQ TBCR Take 20 mEq by mouth every Tuesday, Thursday, Saturday, and Sunday. 1 tab daily by mouth 05/30/19   Emokpae, Courage, MD  senna-docusate (SENOKOT-S) 8.6-50 MG tablet Take 2 tablets by mouth at bedtime. 05/29/19   Shon Hale, MD  Simethicone (GAS-X PO) Take by mouth. Chewable - after each meal.    [provider]  tamsulosin (FLOMAX) 0.4 MG CAPS capsule Take 1 capsule (0.4 mg total) by mouth every evening. 05/29/19   Shon HaleEmokpae, Courage, MD    Allergies    Patient has no known allergies.  Review of Systems   Review of Systems  Unable to perform ROS: Patient nonverbal    Physical Exam Updated Vital Signs BP (!) 114/63   Pulse 59   Temp 97.8 F (36.6 C) (Oral)   Resp 18   Ht 5\' 6"  (1.676 m)   Wt 59 kg   SpO2 98%   BMI 20.98 kg/m   Physical Exam Vitals and nursing note reviewed.  Constitutional:      Appearance: Normal appearance. He is normal weight.  HENT:     Head: Normocephalic and atraumatic.  Cardiovascular:     Rate and Rhythm: Normal rate.  Pulmonary:     Effort: Pulmonary effort is normal. No respiratory distress.  Abdominal:     General: There is no distension.      Palpations: Abdomen is soft.  Genitourinary:    Comments: Patient has blood in his diaper around his area where his penis legs.  He has a lot of blood in the meatus.  He is uncircumcised. Skin:    General: Skin is warm and dry.  Neurological:     Mental Status: He is alert.     Comments: Patient does not respond to any verbal questions, he cannot answer yes or no questions.     ED Results / Procedures / Treatments   Labs (all labs ordered are listed, but only abnormal results are displayed) Labs Reviewed - No data to display  EKG None  Radiology DG Abd 2 Views  Result Date: 11/11/2019 CLINICAL DATA:  Hematuria after urinary catheterization at home. EXAM: ABDOMEN - 2 VIEW COMPARISON:  07/06/2019 abdominal CT FINDINGS: Generalized gaseous distension of bowel. Low volume chest with streaky density at the bases, especially on the right. No clear pneumoperitoneum. Prostate implants. Left nephrolithiasis including a 10 mm stone. IMPRESSION: 1. Generalized gaseous distension of bowel which has been also seen on multiple priors, consistent with chronic dysmotility. 2. Atelectasis/infiltrate at the right lung base. Electronically Signed   By: Marnee SpringJonathon  Watts M.D.   On: 11/11/2019 04:56    Procedures Procedures (including critical care time)  Medications Ordered in ED Medications - No data to display  ED Course  I have reviewed the triage vital signs and the nursing notes.  Pertinent labs & imaging results that were available during my care of the patient were reviewed by me and considered in my medical decision making (see chart for details).    MDM Rules/Calculators/A&P                         We discussed putting in a Foley catheter and putting mittens on the patient.  However the aunt insists that he can still pull them out.  However she did agree to doing that.  However when the nurse went in the room to do this she then refused.  I reviewed his chart from March and at that time  similar situation happened however at that time he had abdominal distention and he had a CT which showed a lot of pneumoperitoneum. He was admitted and is discharged diagnoses was pneumoperitoneum, cystitis, intra-abdominal free air of unknown known etiology.  Two-view x-ray was done tonight.  Patient does not appear to be in any distress.  It does not seem reasonable to  admit the patient to the hospital just so he can have a Foley catheter for couple days until the bleeding can resolve.  Patient is not complaining of abdominal pain and has no obvious abdominal distention or vomiting.  He has chronic toxic megacolon and constipation.  He was last seen by his gastroenterologist, Dr. Karilyn Cota on June 15..  05:42 AM patient discussed with Dr Carren Rang, she states that if the patient were even to be admitted he would be discharged with this being his only problem.  The family can follow-up with his urologist today.    Final Clinical Impression(s) / ED Diagnoses Final diagnoses:  Gross hematuria    Rx / DC Orders ED Discharge Orders    None      Plan discharge   Devoria Albe, MD, Concha Pyo, MD 11/11/19 [phone]

## 2019-11-11 NOTE — Discharge Instructions (Addendum)
Follow-up with Dr. Ronne Binning or the urologist on call for him today.  Return to the emergency department if he gets abdominal pain or vomiting.

## 2019-11-11 NOTE — ED Notes (Signed)
Pt changed

## 2020-02-21 IMAGING — CT CT ABDOMEN AND PELVIS WITH CONTRAST
2 of 4 series · 15 of 46 positions shown, 17 images · IV contrast (Isovue)
Comparison: Abdominal radiograph dated 09/06/2018 and CT dated
09/05/2018

CLINICAL DATA: 59-year-old male with abdominal pain and distention.
Rule out bowel obstruction.

EXAM:
CT ABDOMEN AND PELVIS WITH CONTRAST
TECHNIQUE: Multidetector CT imaging of the abdomen and pelvis was performed
using the standard protocol following bolus administration of
intravenous contrast.
CONTRAST:  100mL OMNIPAQUE IOHEXOL 300 MG/ML  SOLN

[Series 2: axial st · axial · 0.82mm/px · z∈[-498,-53]mm · 12 of 99 slices shown, 14 images]
[im 5/99  soft-tissue]
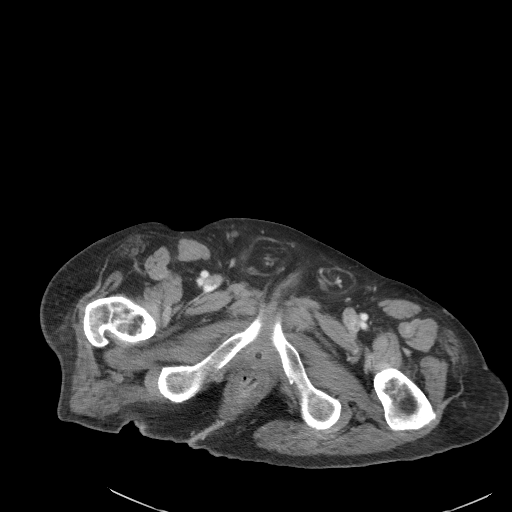
[im 5/99  bone]
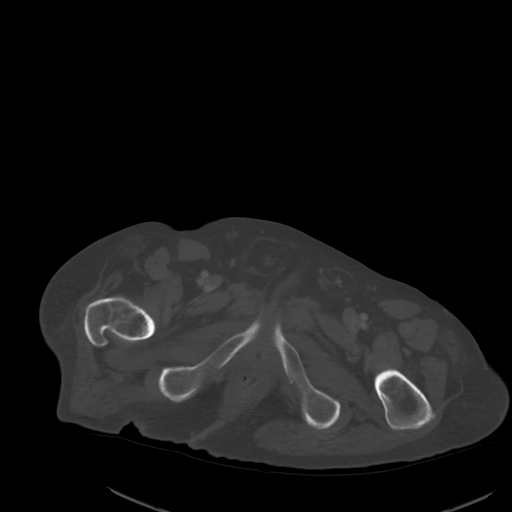
[im 15/99  soft-tissue]
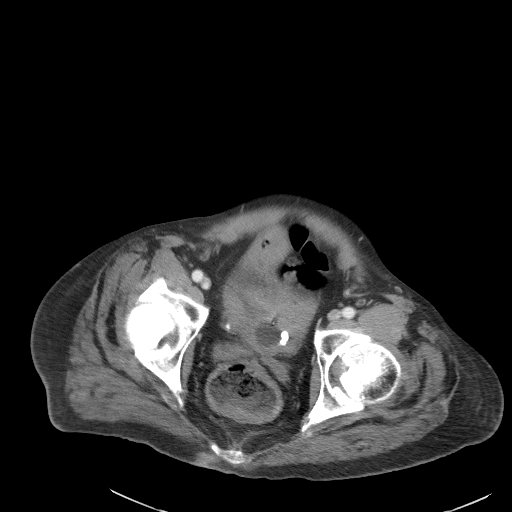
[im 20/99  soft-tissue]
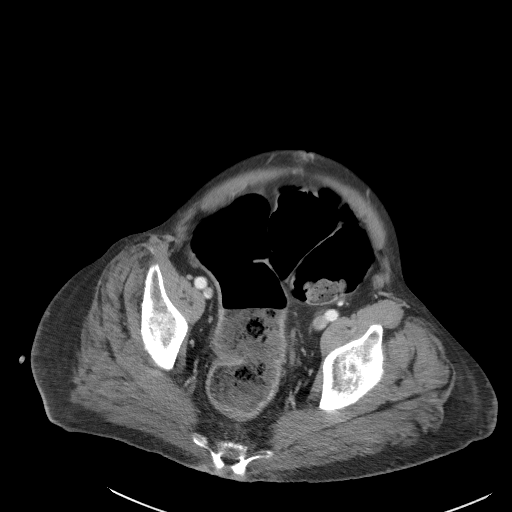
[im 30/99  soft-tissue]
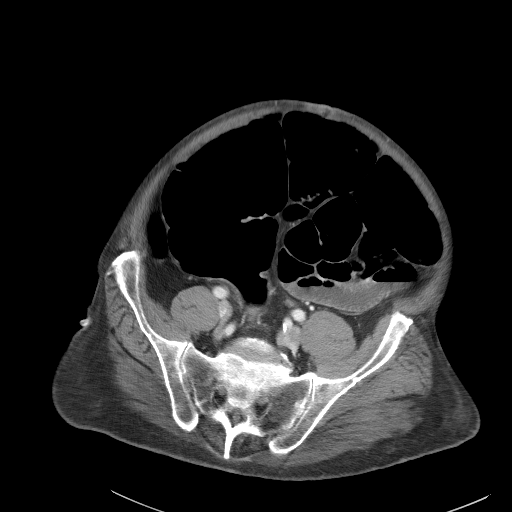
[im 40/99  soft-tissue]
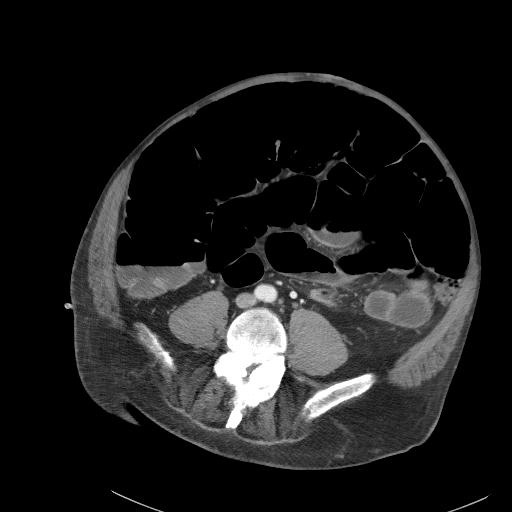
[im 45/99  soft-tissue]
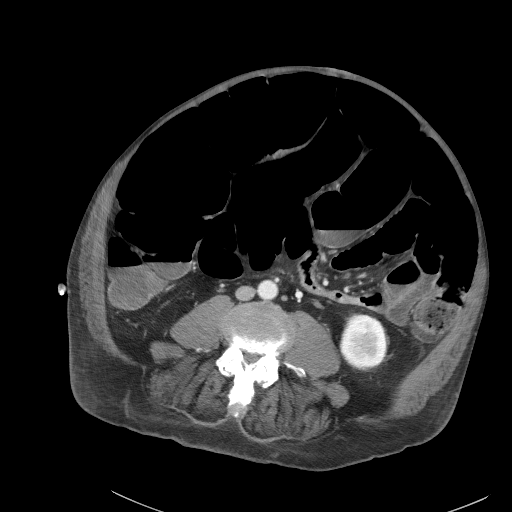
[im 54/99  soft-tissue]
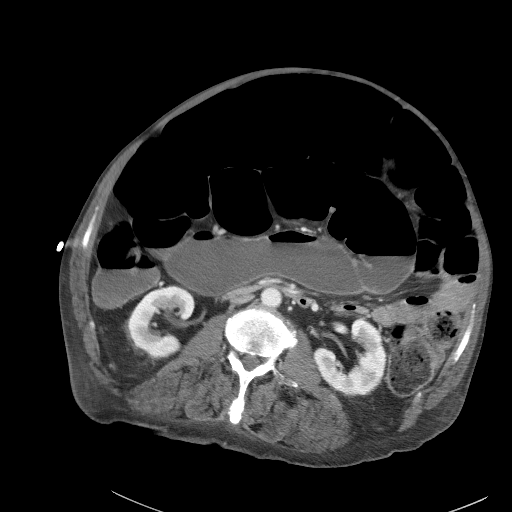
[im 59/99  soft-tissue]
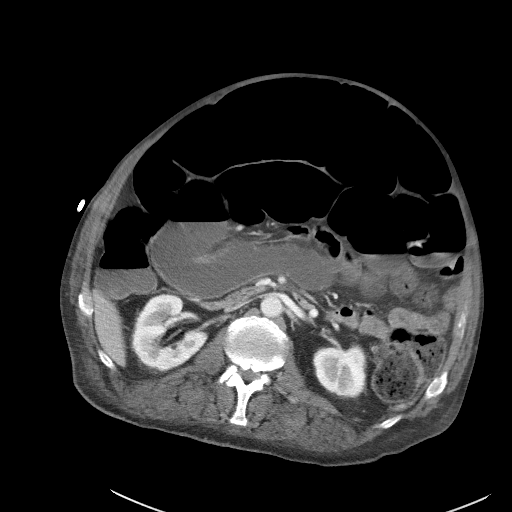
[im 69/99  soft-tissue]
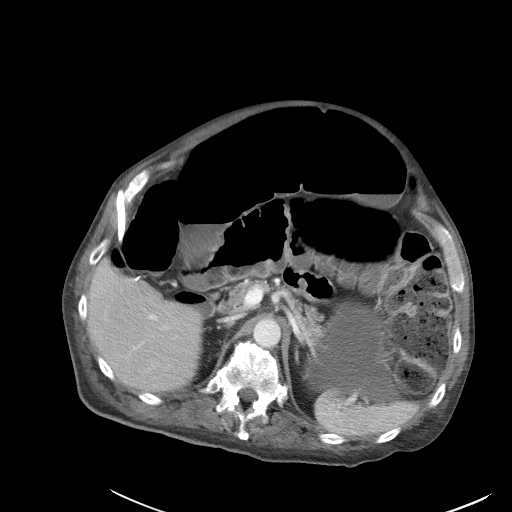
[im 69/99  bone]
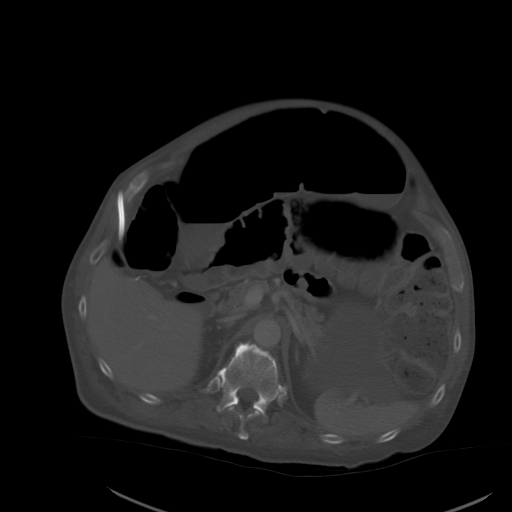
[im 79/99  soft-tissue]
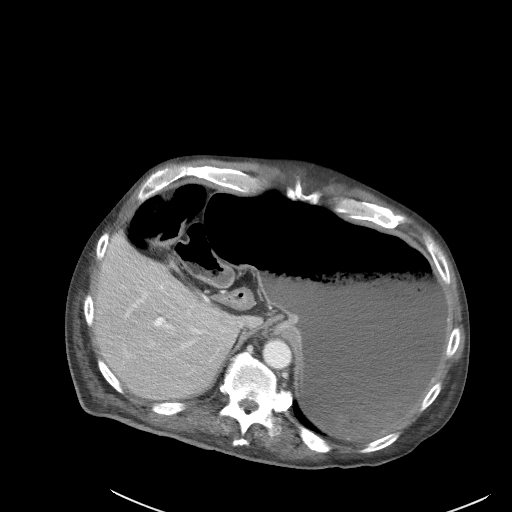
[im 84/99  soft-tissue]
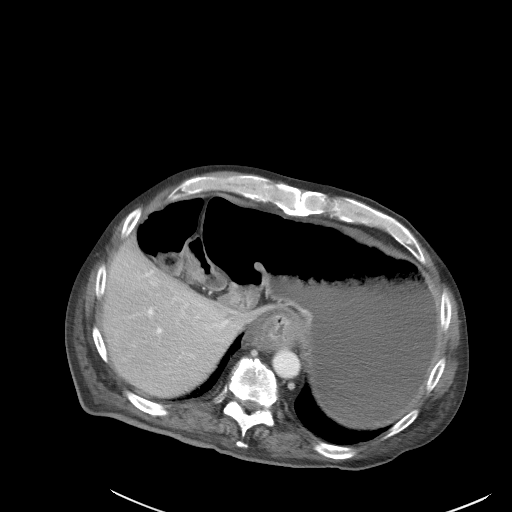
[im 94/99  soft-tissue]
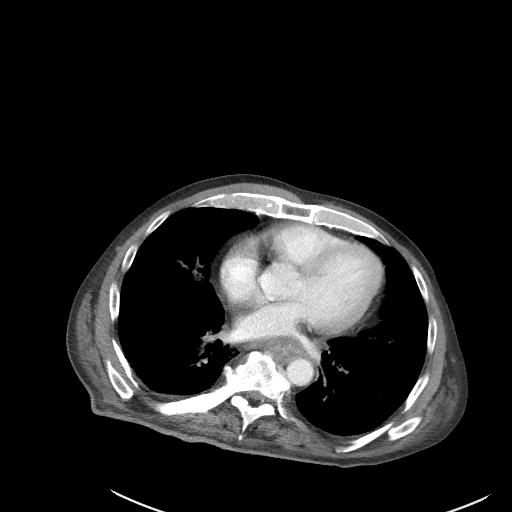

[Series 5: coronal st · coronal · 0.86mm/px · 3 of 110 slices shown]
[im 37/110  soft-tissue]
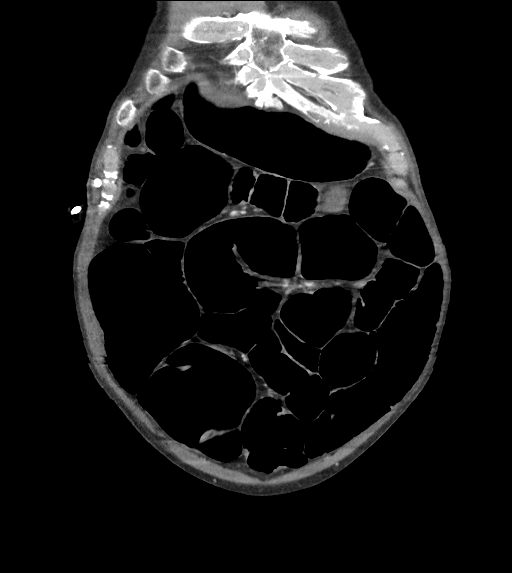
[im 49/110  soft-tissue]
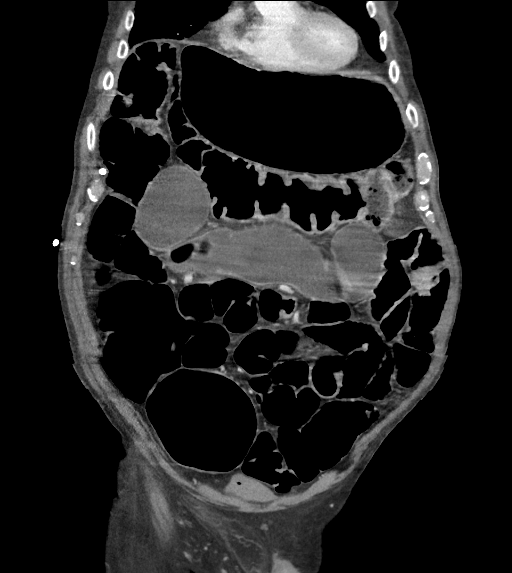
[im 61/110  soft-tissue]
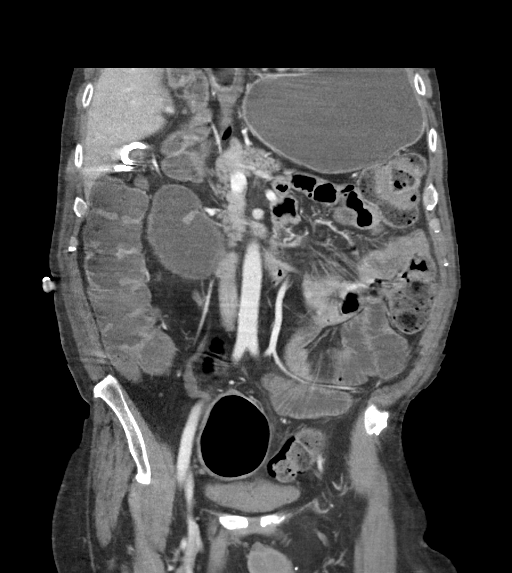

[15 of 46 positions shown; findings below may reference images not displayed]

FINDINGS: Lower chest: The visualized lung bases are clear.

No intra-abdominal free air or free fluid.

Hepatobiliary: The liver is unremarkable. No intrahepatic biliary
ductal dilatation. The gallbladder is contracted around a
percutaneous cholecystostomy.

Pancreas: Unremarkable. No pancreatic ductal dilatation or
surrounding inflammatory changes.

Spleen: Normal in size without focal abnormality.

Adrenals/Urinary Tract: The adrenal glands are unremarkable. Several
nonobstructing left renal calculi measure up to 15 mm in the
interpolar left kidney. There is no hydronephrosis on either side.
There is symmetric enhancement and excretion of contrast by both
kidneys. The visualized ureters appear unremarkable. The urinary
bladder is decompressed around a Foley catheter.

Stomach/Bowel: There is diffuse circumferential thickening of the
visualized distal esophagus which may represent esophagitis
secondary to recurrent reflux. An infiltrative process is not
entirely excluded. There is moderate to severe distention of the
stomach with air and content. There is diffuse dilatation of
air-filled loops of small bowel measuring up to 7.5 cm in diameter
in the anterior abdomen. Findings are somewhat similar to prior CT
and may represent an ileus. There are however multiple normal
caliber loops of small bowel in the left lower abdomen and therefore
a degree of obstruction is not entirely excluded. Small-bowel series
may provide better evaluation to exclude a mechanical obstruction.
There is also diffuse air distention of the colon. There is no
pneumatosis. The appendix is not visualized with certainty.

Vascular/Lymphatic: The abdominal aorta and IVC appear unremarkable.
No portal venous gas. There is no adenopathy.

Reproductive: Multiple surgical clips in the region of the prostate
gland.

Other: None

Musculoskeletal: Degenerative changes of the spine. No acute osseous
pathology.
IMPRESSION: 1. Diffusely dilated and air-filled loops of small bowel similar to
prior CT and may represent an ileus. A degree of small-bowel
obstruction is not entirely excluded. Small-bowel series may provide
better evaluation. Clinical correlation and follow-up recommended.
2. No free air or pneumatosis.  No portal venous gas.
3. Second frontal thickening of the distal esophagus may represent
esophagitis although infiltrative mass is not excluded. Clinical
correlation is recommended.
4. Percutaneous cholecystostomy with contracted gallbladder.
5. Nonobstructing left renal calculi. No hydronephrosis.

## 2020-02-28 ENCOUNTER — Inpatient Hospital Stay (HOSPITAL_COMMUNITY)
Admission: EM | Admit: 2020-02-28 | Discharge: 2020-03-04 | DRG: 388 | Disposition: A | Discharge status: Home Health | Payer: Medicare Other | Attending: Internal Medicine | Admitting: Internal Medicine

## 2020-02-28 ENCOUNTER — Observation Stay (HOSPITAL_COMMUNITY): Payer: Medicare Other

## 2020-02-28 ENCOUNTER — Encounter (HOSPITAL_COMMUNITY): Payer: Self-pay

## 2020-02-28 ENCOUNTER — Other Ambulatory Visit: Payer: Self-pay

## 2020-02-28 ENCOUNTER — Emergency Department (HOSPITAL_COMMUNITY): Payer: Medicare Other

## 2020-02-28 DIAGNOSIS — K56609 Unspecified intestinal obstruction, unspecified as to partial versus complete obstruction: Secondary | ICD-10-CM | POA: Diagnosis not present

## 2020-02-28 DIAGNOSIS — R4701 Aphasia: Secondary | ICD-10-CM | POA: Diagnosis present

## 2020-02-28 DIAGNOSIS — Z781 Physical restraint status: Secondary | ICD-10-CM | POA: Diagnosis not present

## 2020-02-28 DIAGNOSIS — I10 Essential (primary) hypertension: Secondary | ICD-10-CM | POA: Diagnosis present

## 2020-02-28 DIAGNOSIS — Z79899 Other long term (current) drug therapy: Secondary | ICD-10-CM | POA: Diagnosis not present

## 2020-02-28 DIAGNOSIS — K562 Volvulus: Principal | ICD-10-CM | POA: Diagnosis present

## 2020-02-28 DIAGNOSIS — R8281 Pyuria: Secondary | ICD-10-CM | POA: Diagnosis not present

## 2020-02-28 DIAGNOSIS — N132 Hydronephrosis with renal and ureteral calculous obstruction: Secondary | ICD-10-CM | POA: Diagnosis not present

## 2020-02-28 DIAGNOSIS — R140 Abdominal distension (gaseous): Secondary | ICD-10-CM | POA: Diagnosis present

## 2020-02-28 DIAGNOSIS — R338 Other retention of urine: Secondary | ICD-10-CM | POA: Diagnosis present

## 2020-02-28 DIAGNOSIS — F79 Unspecified intellectual disabilities: Secondary | ICD-10-CM | POA: Diagnosis not present

## 2020-02-28 DIAGNOSIS — E119 Type 2 diabetes mellitus without complications: Secondary | ICD-10-CM | POA: Diagnosis present

## 2020-02-28 DIAGNOSIS — Z7984 Long term (current) use of oral hypoglycemic drugs: Secondary | ICD-10-CM | POA: Diagnosis not present

## 2020-02-28 DIAGNOSIS — W19XXXA Unspecified fall, initial encounter: Secondary | ICD-10-CM

## 2020-02-28 DIAGNOSIS — N401 Enlarged prostate with lower urinary tract symptoms: Secondary | ICD-10-CM | POA: Diagnosis not present

## 2020-02-28 DIAGNOSIS — K469 Unspecified abdominal hernia without obstruction or gangrene: Secondary | ICD-10-CM | POA: Diagnosis not present

## 2020-02-28 DIAGNOSIS — E785 Hyperlipidemia, unspecified: Secondary | ICD-10-CM | POA: Diagnosis present

## 2020-02-28 DIAGNOSIS — Z66 Do not resuscitate: Secondary | ICD-10-CM | POA: Diagnosis present

## 2020-02-28 DIAGNOSIS — Z7982 Long term (current) use of aspirin: Secondary | ICD-10-CM | POA: Diagnosis not present

## 2020-02-28 DIAGNOSIS — Z7189 Other specified counseling: Secondary | ICD-10-CM

## 2020-02-28 DIAGNOSIS — U071 COVID-19: Secondary | ICD-10-CM | POA: Diagnosis present

## 2020-02-28 DIAGNOSIS — K567 Ileus, unspecified: Secondary | ICD-10-CM | POA: Diagnosis present

## 2020-02-28 DIAGNOSIS — N400 Enlarged prostate without lower urinary tract symptoms: Secondary | ICD-10-CM | POA: Diagnosis present

## 2020-02-28 LAB — CBC WITH DIFFERENTIAL/PLATELET
Abs Immature Granulocytes: 0.02 10*3/uL (ref 0.00–0.07)
Basophils Absolute: 0 10*3/uL (ref 0.0–0.1)
Basophils Relative: 0 %
Eosinophils Absolute: 0 10*3/uL (ref 0.0–0.5)
Eosinophils Relative: 0 %
HCT: 45.8 % (ref 39.0–52.0)
Hemoglobin: 13.7 g/dL (ref 13.0–17.0)
Immature Granulocytes: 0 %
Lymphocytes Relative: 12 %
Lymphs Abs: 0.9 10*3/uL (ref 0.7–4.0)
MCH: 26.1 pg (ref 26.0–34.0)
MCHC: 29.9 g/dL — ABNORMAL LOW (ref 30.0–36.0)
MCV: 87.2 fL (ref 80.0–100.0)
Monocytes Absolute: 0.5 10*3/uL (ref 0.1–1.0)
Monocytes Relative: 7 %
Neutro Abs: 6.6 10*3/uL (ref 1.7–7.7)
Neutrophils Relative %: 81 %
Platelets: 232 10*3/uL (ref 150–400)
RBC: 5.25 MIL/uL (ref 4.22–5.81)
RDW: 17.8 % — ABNORMAL HIGH (ref 11.5–15.5)
WBC: 8.1 10*3/uL (ref 4.0–10.5)
nRBC: 0 % (ref 0.0–0.2)

## 2020-02-28 LAB — COMPREHENSIVE METABOLIC PANEL
ALT: 17 U/L (ref 0–44)
AST: 22 U/L (ref 15–41)
Albumin: 4.5 g/dL (ref 3.5–5.0)
Alkaline Phosphatase: 84 U/L (ref 38–126)
Anion gap: 12 (ref 5–15)
BUN: 27 mg/dL — ABNORMAL HIGH (ref 8–23)
CO2: 26 mmol/L (ref 22–32)
Calcium: 10.5 mg/dL — ABNORMAL HIGH (ref 8.9–10.3)
Chloride: 97 mmol/L — ABNORMAL LOW (ref 98–111)
Creatinine, Ser: 1.11 mg/dL (ref 0.61–1.24)
GFR, Estimated: >60 mL/min (ref >60)
Glucose, Bld: 168 mg/dL — ABNORMAL HIGH (ref 70–99)
Potassium: 4.4 mmol/L (ref 3.5–5.1)
Sodium: 135 mmol/L (ref 135–145)
Total Bilirubin: 0.6 mg/dL (ref 0.3–1.2)
Total Protein: 9.4 g/dL — ABNORMAL HIGH (ref 6.5–8.1)

## 2020-02-28 LAB — URINALYSIS, ROUTINE W REFLEX MICROSCOPIC
Bilirubin Urine: NEGATIVE
Glucose, UA: NEGATIVE mg/dL
Ketones, ur: 20 mg/dL — AB
Leukocytes,Ua: NEGATIVE
Nitrite: NEGATIVE
Protein, ur: 100 mg/dL — AB
Specific Gravity, Urine: >1.046 — ABNORMAL HIGH (ref 1.005–1.030)
pH: 7 (ref 5.0–8.0)

## 2020-02-28 LAB — D-DIMER, QUANTITATIVE: D-Dimer, Quant: 1.84 ug/mL-FEU — ABNORMAL HIGH (ref 0.00–0.50)

## 2020-02-28 LAB — RESPIRATORY PANEL BY RT PCR (FLU A&B, COVID)
Influenza A by PCR: NEGATIVE
Influenza B by PCR: NEGATIVE
SARS Coronavirus 2 by RT PCR: POSITIVE — AB

## 2020-02-28 LAB — FERRITIN: Ferritin: 20 ng/mL — ABNORMAL LOW (ref 24–336)

## 2020-02-28 LAB — LACTATE DEHYDROGENASE: LDH: 190 U/L (ref 98–192)

## 2020-02-28 LAB — C-REACTIVE PROTEIN: CRP: 1.5 mg/dL — ABNORMAL HIGH (ref <1.0)

## 2020-02-28 LAB — CBG MONITORING, ED: Glucose-Capillary: 182 mg/dL — ABNORMAL HIGH (ref 70–99)

## 2020-02-28 LAB — FIBRINOGEN: Fibrinogen: 666 mg/dL — ABNORMAL HIGH (ref 210–475)

## 2020-02-28 MED ORDER — ONDANSETRON HCL 4 MG/2ML IJ SOLN: 4.0000 mg | Freq: Four times a day (QID) | INTRAMUSCULAR | Status: DC | PRN

## 2020-02-28 MED ORDER — HEPARIN SODIUM (PORCINE) 5000 UNIT/ML IJ SOLN
5000.0000 [IU] | Freq: Three times a day (TID) | INTRAMUSCULAR | Status: DC
  Administered 2020-02-28 – 2020-03-04 (×13): 5000 [IU] via SUBCUTANEOUS
  Filled 2020-02-28 (×14): qty 1

## 2020-02-28 MED ORDER — ONDANSETRON HCL 4 MG PO TABS: 4.0000 mg | ORAL_TABLET | Freq: Four times a day (QID) | ORAL | Status: DC | PRN

## 2020-02-28 MED ORDER — SODIUM CHLORIDE 0.9 % IV SOLN: INTRAVENOUS | Status: DC

## 2020-02-28 MED ORDER — DEXAMETHASONE SODIUM PHOSPHATE 10 MG/ML IJ SOLN
6.0000 mg | INTRAMUSCULAR | Status: DC
  Administered 2020-02-28 – 2020-02-29 (×2): 6 mg via INTRAVENOUS
  Filled 2020-02-28 (×2): qty 1

## 2020-02-28 MED ORDER — INSULIN ASPART 100 UNIT/ML ~~LOC~~ SOLN
0.0000 [IU] | Freq: Three times a day (TID) | SUBCUTANEOUS | Status: DC
  Administered 2020-02-28 – 2020-02-29 (×2): 2 [IU] via SUBCUTANEOUS
  Administered 2020-03-01: 1 [IU] via SUBCUTANEOUS
  Administered 2020-03-02 – 2020-03-03 (×4): 2 [IU] via SUBCUTANEOUS
  Administered 2020-03-04: 1 [IU] via SUBCUTANEOUS
  Filled 2020-02-28 (×2): qty 1

## 2020-02-28 MED ORDER — IOHEXOL 300 MG/ML  SOLN
100.0000 mL | Freq: Once | INTRAMUSCULAR | Status: AC | PRN
  Administered 2020-02-28: 100 mL via INTRAVENOUS

## 2020-02-28 NOTE — ED Notes (Signed)
Date and time results received: 02/28/20 1858  Test: COVID Critical Value: positive  Name of Provider Notified: Mariea Clonts, MD  Orders Received? Or Actions Taken?: acknowledged

## 2020-02-28 NOTE — ED Notes (Signed)
Call from pt's niece  [name] [phone]  She is given infor that pt is to be admitted, that he has an NG tube   Pt reports her mother is [name] Long and spoke w her mother   Dr E asked to contact Mrs Long and speak with regarding results and condition   Pt aunts   Otelia Sergeant [phone]  [place] [phone]

## 2020-02-28 NOTE — ED Notes (Addendum)
Gastric contents guaic   Result positive   Dr E informed   NG tube advanced

## 2020-02-28 NOTE — ED Provider Notes (Signed)
Blood pressure 112/60, pulse 75, temperature 98 F (36.7 C), temperature source Oral, resp. rate 16, height 5\' 6"  (1.676 m), weight 59 kg, SpO2 98 %.  Assuming care from Dr. .  In short, Levi [name] is a 69 y.o. male with a chief complaint of Abdominal Pain .  Refer to the original H&P for additional details.  The current [name] of care is to f/u on UA and CT abdomen/pelvis.  05:05 PM  Spoke with Dr. 66 regarding the CT findings. No emergent surgery or abx at this time. NG tube and [facility] admit. Surgery will follow. Updated patient's aunt by phone.   05:55 PM  NG tube in place. Patient tolerated procedure well. Large volume output with intermittent suction. Patient requires restraints to keep from pulling NG tube out. Has already tried to pull out here. Soft restraints applied. Patient tolerating well. Will admit.   Discussed patient's case with [facility] to request admission. Patient and family (if present) updated with [name]. Care transferred to [facility] service.  I reviewed all nursing notes, vitals, pertinent old records, EKGs, labs, imaging (as available).   CRITICAL CARE Performed by: [facility] Total critical care time: 35 minutes Critical care time was exclusive of separately billable procedures and treating other patients. Critical care was necessary to treat or prevent imminent or life-threatening deterioration. Critical care was time spent personally by me on the following activities: development of treatment [name] with patient and/or surrogate as well as nursing, discussions with consultants, evaluation of patient's response to treatment, examination of patient, obtaining history from patient or surrogate, ordering and performing treatments and interventions, ordering and review of laboratory studies, ordering and review of radiographic studies, pulse oximetry and re-evaluation of patient's condition.  Levi Plan, MD Emergency Medicine    Levi [name], Levi Bene, MD 03/03/20  1010

## 2020-02-28 NOTE — ED Notes (Addendum)
Call to [facility]  [phone]  No answer   Call to [facility] [phone]  U/A to leave message

## 2020-02-28 NOTE — ED Notes (Signed)
Attempt to place condom cath unsuccessful   Pt is in a brief and urinal is placed between legs

## 2020-02-28 NOTE — ED Provider Notes (Signed)
[facility] EMERGENCY DEPARTMENT Provider Note   CSN: [id number] Arrival date & time: 02/28/20  1332     History Chief Complaint  Patient presents with  . Abdominal Pain    [name] is a 69 y.o. male.  HPI He presents for evaluation of several things.  He came by EMS.  The patient was at church today with family members according to his and [name] who gave me information over the telephone.  He apparently fell while walking on a ramp, with his walker.  He seemed to be in pain afterwards but it was unclear where he was hurting because he is nonverbal.  There were no visualized injuries.  EMS was called to bring him here for evaluation.  [name] also notes that the patient has had abdominal swelling for 2 days and no bowel movement for 3 days.  She is giving him a suppository but it has not helped.  He has intermittent swelling of his abdomen that she states is because of an air leak.  The patient also has ongoing back pain for several weeks.  There has been no change in appetite, vomiting or other illnesses.  He has had Covid vaccines x2.  There are no other known modifying factors.  All history is from his aunt [name] who he lives with.    Past Medical History:  Diagnosis Date  . Diabetes mellitus without complication (HCC)   . History of gout   . History of kidney stones   . Hypertension   . Mentally challenged   . Urinary retention     Patient Active Problem List   Diagnosis Date Noted  . Palliative care by specialist   . Pneumoperitoneum 07/04/2019  . Intra-abdominal free air of unknown etiology 07/04/2019  . Cystitis   . Chronic cholecystitis 05/21/2019  . Chronic constipation 03/31/2019  . Cholecystostomy care (HCC) 11/04/2018  . Acute lower UTI 09/18/2018  . Abdominal distention 09/18/2018  . Hypomagnesemia 09/09/2018  . Protein-calorie malnutrition, severe (HCC) 09/09/2018  . Acute blood loss anemia 09/09/2018  . Gangrenous cholecystitis 09/05/2018  .  Volvulus (HCC) 09/03/2018  . Ileus (HCC)   . Pneumatosis intestinalis   . Gall bladder disease 08/25/2018  . Small bowel ischemia (HCC)   . Acute gangrenous cholecystitis 05/24/2018  . SBO (small bowel obstruction) (HCC) 05/24/2018  . Abdominal distension   . Pressure injury of skin 04/09/2018  . C. difficile diarrhea 04/08/2018  . Bilateral hydronephrosis 04/08/2018  . Pneumoperitoneum of unknown etiology   . Diarrhea 04/07/2018  . Nonverbal 04/07/2018  . Hydronephrosis with urinary obstruction due to ureteral calculus 06/20/2016  . Sepsis secondary to UTI (HCC) 06/20/2016  . Fever 06/19/2016  . Generalized weakness 06/19/2016  . Diabetes mellitus type 2, noninsulin dependent (HCC) 06/19/2016  . Severe sepsis with septic shock (HCC) 06/19/2016  . UTI (urinary tract infection) 06/19/2016  . Hematuria 06/19/2016  . AKI (acute kidney injury) (HCC) 06/19/2016  . Hyponatremia 06/19/2016  . Hypokalemia 06/19/2016  . BPH (benign prostatic hyperplasia) 06/19/2016  . Megacolon 05/07/2014  . Urinary retention 05/07/2014  . Intellectual disability 05/07/2014  . Hyperlipidemia 05/07/2014  . HTN (hypertension) 05/07/2014  . Renal calculus 01/14/2012    Past Surgical History:  Procedure Laterality Date  . CHOLECYSTECTOMY N/A 05/25/2019   Procedure: OPEN CHOLECYSTECTOMY;  Surgeon: [name], MD;  Location: [facility];  Service: General;  Laterality: N/A;  . CYSTOSCOPY W/ URETERAL STENT PLACEMENT Bilateral 06/20/2016   Procedure: CYSTOSCOPY WITH RETROGRADE PYELOGRAM/URETERAL STENT  PLACEMENT;  Surgeon: Malen Gauze, MD;  Location: [facility];  Service: Urology;  Laterality: Bilateral;  . CYSTOSCOPY WITH RETROGRADE PYELOGRAM, URETEROSCOPY AND STENT PLACEMENT Bilateral 08/20/2016   Procedure: CYSTOSCOPY WITH BILATERAL RETROGRADE PYELOGRAM, BILATERAL URETEROSCOPY,  LASER LITHOTRIPSY OF RIGHT URETERAL CALCULI, STONE BASKET EXTRACTION LEFT URETERAL CALCULI  AND BILATERAL URETERAL STENT EXCHANGE;   Surgeon: Malen Gauze, MD;  Location: [facility];  Service: Urology;  Laterality: Bilateral;  . CYSTOSCOPY WITH RETROGRADE PYELOGRAM, URETEROSCOPY AND STENT PLACEMENT Left 01/14/2019   Procedure: CYSTOSCOPY WITH LEFT RETROGRADE PYELOGRAM, LEFT URETEROSCOPY AND LEFT URETERAL STENT PLACEMENT;  Surgeon: Malen Gauze, MD;  Location: [facility];  Service: Urology;  Laterality: Left;  . CYSTOSCOPY WITH URETHRAL DILATATION  01/14/2019   Procedure: CYSTOSCOPY WITH URETHRAL DILATATION;  Surgeon: Malen Gauze, MD;  Location: [facility];  Service: Urology;;  . HOLMIUM LASER APPLICATION Bilateral 08/20/2016   Procedure: HOLMIUM LASER APPLICATION;  Surgeon: Malen Gauze, MD;  Location: [facility];  Service: Urology;  Laterality: Bilateral;  . HOLMIUM LASER APPLICATION Left 01/14/2019   Procedure: HOLMIUM LASER APPLICATION;  Surgeon: Malen Gauze, MD;  Location: [facility];  Service: Urology;  Laterality: Left;  . IR CATHETER TUBE CHANGE  07/04/2018  . IR EXCHANGE BILIARY DRAIN  06/09/2018  . IR EXCHANGE BILIARY DRAIN  10/20/2018  . IR EXCHANGE BILIARY DRAIN  01/12/2019  . IR EXCHANGE BILIARY DRAIN  04/07/2019  . IR PERC CHOLECYSTOSTOMY  05/24/2018  . LAPAROTOMY N/A 08/25/2018   Procedure: EXPLORATORY LAPAROTOMY reduction of volvulus;  Surgeon: [name], MD;  Location: [facility];  Service: General;  Laterality: N/A;  . LAPAROTOMY N/A 05/25/2019   Procedure: EXPLORATORY LAPAROTOMY;  Surgeon: [name], MD;  Location: [facility];  Service: General;  Laterality: N/A;       Family History  Problem Relation Age of Onset  . Gallbladder disease Other     Social History   Tobacco Use  . Smoking status: Never Smoker  . Smokeless tobacco: Never Used  Vaping Use  . Vaping Use: Never used  Substance Use Topics  . Alcohol use: No  . Drug use: No    Home Medications Prior to Admission medications   Medication Sig Start Date End Date Taking? Authorizing Provider  acetaminophen (TYLENOL)  500 MG tablet Take 1,000 mg by mouth every 6 (six) hours as needed for mild pain or headache.    [provider]  aspirin EC 81 MG tablet Take 1 tablet (81 mg total) by mouth daily with breakfast. 05/29/19   Mariea Clonts, Courage, MD  atorvastatin (LIPITOR) 20 MG tablet Take 20 mg by mouth at bedtime. 06/09/19   [provider]  bisacodyl (DULCOLAX) 10 MG suppository Place 1 suppository (10 mg total) rectally daily as needed for moderate constipation. 07/08/19   Shon Hale, MD  finasteride (PROSCAR) 5 MG tablet Take 1 tablet (5 mg total) by mouth daily. 05/29/19   Shon Hale, MD  linaclotide Karlene Einstein) 145 MCG CAPS capsule Take 1 capsule (145 mcg total) by mouth daily before breakfast. 05/29/19   Shon Hale, MD  magnesium oxide (MAG-OX) 400 MG tablet Take 1 tablet (400 mg total) by mouth daily. 10/23/18   Sharee Holster, NP  metFORMIN (GLUCOPHAGE) 850 MG tablet Take 1 tablet (850 mg total) by mouth 2 (two) times daily with a meal. 05/29/19 05/28/20  Shon Hale, MD  Potassium Chloride ER 20 MEQ TBCR Take 20 mEq by mouth every Tuesday, Thursday, Saturday, and Sunday. 1  tab daily by mouth 05/30/19   Emokpae, Courage, MD  senna-docusate (SENOKOT-S) 8.6-50 MG tablet Take 2 tablets by mouth at bedtime. 05/29/19   Shon HaleEmokpae, Courage, MD  Simethicone (GAS-X PO) Take by mouth. Chewable - after each meal.    [provider]  tamsulosin (FLOMAX) 0.4 MG CAPS capsule Take 1 capsule (0.4 mg total) by mouth every evening. 05/29/19   Shon HaleEmokpae, Courage, MD    Allergies    Patient has no known allergies.  Review of Systems   Review of Systems  All other systems reviewed and are negative.   Physical Exam Updated Vital Signs BP 112/60   Pulse 75   Temp 98 F (36.7 C) (Oral)   Resp 16   Ht 5\' 6"  (1.676 m)   Wt 59 kg   SpO2 98%   BMI 20.99 kg/m   Physical Exam Vitals and nursing note reviewed.  Constitutional:      General: He is not in acute distress.    Appearance:  He is well-developed. He is not ill-appearing, toxic-appearing or diaphoretic.  HENT:     Head: Normocephalic and atraumatic.     Right Ear: External ear normal.     Left Ear: External ear normal.  Eyes:     Conjunctiva/sclera: Conjunctivae normal.     Pupils: Pupils are equal, round, and reactive to light.  Neck:     Trachea: Phonation normal.  Cardiovascular:     Rate and Rhythm: Normal rate and regular rhythm.     Heart sounds: Normal heart sounds.  Pulmonary:     Effort: Pulmonary effort is normal. No respiratory distress.     Breath sounds: Normal breath sounds. No stridor.  Abdominal:     General: There is distension.     Palpations: Abdomen is soft. There is no mass.     Tenderness: There is abdominal tenderness (Mild, diffuse).     Hernia: No hernia is present.  Musculoskeletal:        General: No swelling or tenderness. Normal range of motion.     Cervical back: Normal range of motion and neck supple.  Skin:    General: Skin is warm and dry.  Neurological:     Mental Status: He is alert.     Cranial Nerves: No cranial nerve deficit.     Sensory: Sensory deficit present.     Motor: No abnormal muscle tone.     Coordination: Coordination normal.  Psychiatric:        Mood and Affect: Mood normal.        Behavior: Behavior normal.     ED Results / Procedures / Treatments   Labs (all labs ordered are listed, but only abnormal results are displayed) Labs Reviewed  COMPREHENSIVE METABOLIC PANEL - Abnormal; Notable for the following components:      Result Value   Chloride 97 (*)    Glucose, Bld 168 (*)    BUN 27 (*)    Calcium 10.5 (*)    Total Protein 9.4 (*)    All other components within normal limits  CBC WITH DIFFERENTIAL/PLATELET - Abnormal; Notable for the following components:   MCHC 29.9 (*)    RDW 17.8 (*)    All other components within normal limits  URINALYSIS, ROUTINE W REFLEX MICROSCOPIC    EKG None  Radiology No results  found.  Procedures Procedures (including critical care time)  Medications Ordered in ED Medications  0.9 %  sodium chloride infusion ( Intravenous New Bag/Given 02/28/20  1438)  iohexol (OMNIPAQUE) 300 MG/ML solution 100 mL (100 mLs Intravenous Contrast Given 02/28/20 1505)    ED Course  I have reviewed the triage vital signs and the nursing notes.  Pertinent labs & imaging results that were available during my care of the patient were reviewed by me and considered in my medical decision making (see chart for details).    MDM Rules/Calculators/A&P                           Patient Vitals for the past 24 hrs:  BP Temp Temp src Pulse Resp SpO2 Height Weight  02/28/20 1344 -- -- -- -- -- -- 5\' 6"  (1.676 m) 59 kg  02/28/20 1343 112/60 98 F (36.7 C) Oral 75 16 98 % -- --      Medical Decision Making:  This patient is presenting for evaluation of fall, and abdominal distention, which does require a range of treatment options, and is a complaint that involves a moderate risk of morbidity and mortality. The differential diagnoses include traumatic injuries, bowel obstruction, bowel perforation. I decided to review old records, and in summary elderly male, who is nonverbal, and lives with her family member, 03/01/20 who gives history.  I obtained additional historical information from his aunt by telephone.  Clinical Laboratory Tests Ordered, included CBC, Metabolic panel and Urinalysis. Review indicates chloride low, glucose high, BUN high, calcium high, total protein high, CBC essentially normal.  Urine pending at 3:15 PM. Radiologic Tests Ordered, included CT abdomen pelvis.    Critical Interventions-clinical evaluation, laboratory testing, CT imaging, observation    CRITICAL CARE-no Performed by: [name]  Nursing Notes Reviewed/ Care Coordinated Applicable Imaging Reviewed Interpretation of Laboratory Data incorporated into ED treatment  Care transferred to Dr. Mancel Bale  at 3:15 PM, to follow-up on urinalysis, CT imaging, and discussed findings with family member, Jacqulyn Bath.    Final Clinical Impression(s) / ED Diagnoses Final diagnoses:  Fall, initial encounter  Abdominal distention    Rx / DC Orders ED Discharge Orders    None       05-12-1971, MD 02/28/20 1514

## 2020-02-28 NOTE — ED Notes (Addendum)
NG placement w immediate return of Green/brown gastric contents  Restraints placed to stop NG tube disruption by pt   Dr Jacqulyn Bath in to assess

## 2020-02-28 NOTE — ED Notes (Signed)
Pt pulling at restraints trying to get to face and ? NG tube  Restraints shortened on the right side  Pt is developmentally delayed and non verbal   He is encouraged not to try to pull out his NG, but per report his Aunt reports when he has had an NG in past admits, he has had to have soft restraints due to pulling out his tubes

## 2020-02-28 NOTE — ED Triage Notes (Signed)
Patient here with three day complaint of abdominal pain and swelling. Intermittent problem per family friend whom also reports to EMS that patient has not had BM or urinated in three days. Patient is non-verbal. Noted with sig. Abdominal distention and shakes head "yes" when abdomen palpated referring to pain.

## 2020-02-28 NOTE — H&P (Addendum)
History and Physical    Levi Shinelbert S Fulginiti HYQ:[id number] DOB: 1950-07-21 DOA: 02/28/2020  PCP: [name], [name], MD   Patient coming from: Home  I have personally briefly reviewed [facility]  Chief Complaint: Abdominal Pain  HPI: [name] is a 69 y.o. male with medical history significant for intermittent disability, hypertension, BPH, bilateral hydronephrosis, and extensive abdominal history including gangrenous cholecystitis, pneumo intestinalis, ileus, pneumoperitoneum. History is obtained from chart review and patient's aunt.  Patient is nonverbal at baseline. Patient was brought to the ED with complaints of abdominal swelling of 3 days duration.  Patient lives with his aunt [name], ED provider was able to talk to her on the phone.  Patient's last bowel movement was 3 days ago.  She gave patient a suppository but this did not help.  Patient had also had ongoing back pain for several weeks.    Admitted back in March 2021 with intra-abdominal free air of unknown etiology, had persistent pneumoperitoneum without definitive source of perforation.  Managed conservatively.  Prior to that 05/2019 had open laparotomy for pneumatosis intestinalis is and cholecystitis/cholelithiasis.  I called patient's aunt [name]-patient's niece [name] at [phone].  She confirms history of abdominal distention, and reports that today patient was walking up a ramp with his walker when he kind of slumped but he did not fall because her daughter and patient's niece held onto patient and just gently laid him down to the floor to sit down.  He did not hit his head.  She denies any Covid related symptoms inpatient.  Patient is fully vaccinated for Covid, received 2 doses of Moderna back in March.  No known history of sick contacts.  She is also emphasizes that she does not want patient to have any more abdominal surgeries.  ED Course: Stable vitals.  Electrolytes within normal  limits, unremarkable CBC.  Abdominal CT showed small bowel obstruction with transition zone at the distal/terminal ileum secondary to volvulus or internal hernia.  Multiple nonobstructing left renal calculi, mild left hydronephrosis, trabeculated bladder-chronic bladder outlet obstruction of chronic bladder dysfunction. EDP talked to general surgeon Dr. Lovell SheehanJenkins recommended admission, NG tube placement, patient will be seen in the morning.  Review of Systems: Exam limited by patient's baseline intellectual disability.  Past Medical History:  Diagnosis Date  . Diabetes mellitus without complication (HCC)   . History of gout   . History of kidney stones   . Hypertension   . Mentally challenged   . Urinary retention     Past Surgical History:  Procedure Laterality Date  . CHOLECYSTECTOMY N/A 05/25/2019   Procedure: OPEN CHOLECYSTECTOMY;  Surgeon: Lucretia RoersBridges, Lindsay C, MD;  Location: [facility];  Service: General;  Laterality: N/A;  . CYSTOSCOPY W/ URETERAL STENT PLACEMENT Bilateral 06/20/2016   Procedure: CYSTOSCOPY WITH RETROGRADE PYELOGRAM/URETERAL STENT PLACEMENT;  Surgeon: Malen GauzePatrick L McKenzie, MD;  Location: [facility];  Service: Urology;  Laterality: Bilateral;  . CYSTOSCOPY WITH RETROGRADE PYELOGRAM, URETEROSCOPY AND STENT PLACEMENT Bilateral 08/20/2016   Procedure: CYSTOSCOPY WITH BILATERAL RETROGRADE PYELOGRAM, BILATERAL URETEROSCOPY,  LASER LITHOTRIPSY OF RIGHT URETERAL CALCULI, STONE BASKET EXTRACTION LEFT URETERAL CALCULI  AND BILATERAL URETERAL STENT EXCHANGE;  Surgeon: Malen GauzeMcKenzie, Patrick L, MD;  Location: [facility];  Service: Urology;  Laterality: Bilateral;  . CYSTOSCOPY WITH RETROGRADE PYELOGRAM, URETEROSCOPY AND STENT PLACEMENT Left 01/14/2019   Procedure: CYSTOSCOPY WITH LEFT RETROGRADE PYELOGRAM, LEFT URETEROSCOPY AND LEFT URETERAL STENT PLACEMENT;  Surgeon: Malen GauzeMcKenzie, Patrick L, MD;  Location: [facility];  Service: Urology;  Laterality: Left;  . CYSTOSCOPY WITH URETHRAL DILATATION  01/14/2019    Procedure: CYSTOSCOPY WITH URETHRAL DILATATION;  Surgeon: Malen Gauze, MD;  Location: [facility];  Service: Urology;;  . HOLMIUM LASER APPLICATION Bilateral 08/20/2016   Procedure: HOLMIUM LASER APPLICATION;  Surgeon: Malen Gauze, MD;  Location: [facility];  Service: Urology;  Laterality: Bilateral;  . HOLMIUM LASER APPLICATION Left 01/14/2019   Procedure: HOLMIUM LASER APPLICATION;  Surgeon: Malen Gauze, MD;  Location: [facility];  Service: Urology;  Laterality: Left;  . IR CATHETER TUBE CHANGE  07/04/2018  . IR EXCHANGE BILIARY DRAIN  06/09/2018  . IR EXCHANGE BILIARY DRAIN  10/20/2018  . IR EXCHANGE BILIARY DRAIN  01/12/2019  . IR EXCHANGE BILIARY DRAIN  04/07/2019  . IR PERC CHOLECYSTOSTOMY  05/24/2018  . LAPAROTOMY N/A 08/25/2018   Procedure: EXPLORATORY LAPAROTOMY reduction of volvulus;  Surgeon: [name], MD;  Location: [facility];  Service: General;  Laterality: N/A;  . LAPAROTOMY N/A 05/25/2019   Procedure: EXPLORATORY LAPAROTOMY;  Surgeon: [name], MD;  Location: [facility];  Service: General;  Laterality: N/A;     reports that he has never smoked. He has never used smokeless tobacco. He reports that he does not drink alcohol and does not use drugs.  No Known Allergies  Family History  Problem Relation Age of Onset  . Gallbladder disease Other     Prior to Admission medications   Medication Sig Start Date End Date Taking? Authorizing Provider  acetaminophen (TYLENOL) 500 MG tablet Take 1,000 mg by mouth every 6 (six) hours as needed for mild pain or headache.    [provider]  aspirin EC 81 MG tablet Take 1 tablet (81 mg total) by mouth daily with breakfast. 05/29/19   Mariea Clonts, Courage, MD  atorvastatin (LIPITOR) 20 MG tablet Take 20 mg by mouth at bedtime. 06/09/19   [provider]  bisacodyl (DULCOLAX) 10 MG suppository Place 1 suppository (10 mg total) rectally daily as needed for moderate constipation. 07/08/19   Shon Hale, MD   finasteride (PROSCAR) 5 MG tablet Take 1 tablet (5 mg total) by mouth daily. 05/29/19   Shon Hale, MD  linaclotide Karlene Einstein) 145 MCG CAPS capsule Take 1 capsule (145 mcg total) by mouth daily before breakfast. 05/29/19   Shon Hale, MD  magnesium oxide (MAG-OX) 400 MG tablet Take 1 tablet (400 mg total) by mouth daily. 10/23/18   Sharee Holster, NP  metFORMIN (GLUCOPHAGE) 850 MG tablet Take 1 tablet (850 mg total) by mouth 2 (two) times daily with a meal. 05/29/19 05/28/20  Shon Hale, MD  Potassium Chloride ER 20 MEQ TBCR Take 20 mEq by mouth every Tuesday, Thursday, Saturday, and Sunday. 1 tab daily by mouth 05/30/19   Neely Kammerer, Courage, MD  senna-docusate (SENOKOT-S) 8.6-50 MG tablet Take 2 tablets by mouth at bedtime. 05/29/19   Shon Hale, MD  Simethicone (GAS-X PO) Take by mouth. Chewable - after each meal.    [provider]  tamsulosin (FLOMAX) 0.4 MG CAPS capsule Take 1 capsule (0.4 mg total) by mouth every evening. 05/29/19   Shon Hale, MD    Physical Exam: Exam limited by patient's intellectual disability, following some directions. Vitals:   02/28/20 1343 02/28/20 1344 02/28/20 1609  BP: 112/60  (!) 122/55  Pulse: 75  77  Resp: 16  16  Temp: 98 F (36.7 C)    TempSrc: Oral    SpO2: 98%  95%  Weight:  59 kg  Height:  5\' 6"  (1.676 m)     Constitutional: NAD, calm, comfortable Vitals:   02/28/20 1343 02/28/20 1344 02/28/20 1609  BP: 112/60  (!) 122/55  Pulse: 75  77  Resp: 16  16  Temp: 98 F (36.7 C)    TempSrc: Oral    SpO2: 98%  95%  Weight:  59 kg   Height:  5\' 6"  (1.676 m)    Eyes: PERRL, lids and conjunctivae normal ENMT: Mucous membranes are moist.  Neck: normal, supple, no masses, no thyromegaly Respiratory: clear to auscultation bilaterally, no wheezing, no crackles. Normal respiratory effort. No accessory muscle use.  Cardiovascular: Regular rate and rhythm,  No extremity edema. 2+ pedal pulses.  Abdomen: Markedly and  unevenly distended, tense, no tenderness appreciated, unable to palpate intra-abdominal organs.  Large amount - ~ 650 mils of very dark appearing fluid draining via NG tube. Musculoskeletal: no clubbing / cyanosis. No joint deformity upper and lower extremities. Good ROM, no contractures. Normal muscle tone.  Skin: no rashes, lesions, ulcers. No induration Neurologic: No apparent cranial nerve abnormality.  Moving extremities spontaneously.  In soft restraints. Psychiatric: Awake and alert.  Exam limited by baseline disability.  Following some directions.  Labs on Admission: I have personally reviewed following labs and imaging studies  CBC: Recent Labs  Lab 02/28/20 1432  WBC 8.1  NEUTROABS 6.6  HGB 13.7  HCT 45.8  MCV 87.2  PLT 232   Basic Metabolic Panel: Recent Labs  Lab 02/28/20 1432  NA 135  K 4.4  CL 97*  CO2 26  GLUCOSE 168*  BUN 27*  CREATININE 1.11  CALCIUM 10.5*   Liver Function Tests: Recent Labs  Lab 02/28/20 1432  AST 22  ALT 17  ALKPHOS 84  BILITOT 0.6  PROT 9.4*  ALBUMIN 4.5    Radiological Exams on Admission: CT Abdomen Pelvis W Contrast  Result Date: 02/28/2020 CLINICAL DATA:  69 year old male with abdominal distension. EXAM: CT ABDOMEN AND PELVIS WITH CONTRAST TECHNIQUE: Multidetector CT imaging of the abdomen and pelvis was performed using the standard protocol following bolus administration of intravenous contrast. CONTRAST:  03/01/2020 OMNIPAQUE IOHEXOL 300 MG/ML  SOLN COMPARISON:  CT abdomen pelvis dated 07/06/2019. FINDINGS: Lower chest: There are bibasilar atelectasis. No intra-abdominal free air or free fluid. Hepatobiliary: The liver is unremarkable. No intrahepatic biliary dilatation. Cholecystectomy. Pancreas: Unremarkable. No pancreatic ductal dilatation or surrounding inflammatory changes. Spleen: Normal in size without focal abnormality. Adrenals/Urinary Tract: The adrenal glands unremarkable. There is a 12 mm nonobstructing left renal  inferior pole calculus. Additional smaller nonobstructing stones noted in the inferior pole of the left kidney. There is a 3 mm nonobstructing calculus in the left renal pelvis. There is mild left hydronephrosis. The right kidney is unremarkable. The visualized ureters appear unremarkable. There is diffuse thickened and trabeculated appearance of the bladder wall which may be related to chronic bladder outlet obstruction or chronic bladder dysfunction. Correlation with urinalysis recommended to exclude UTI Stomach/Bowel: Moderate to severe gastric distension. There is ingested content in the distal esophagus, likely reflux. Multiple dilated and fluid-filled loops of small bowel with air-fluid levels noted throughout the abdomen measuring up to 9.5 cm in the right upper quadrant. There is an area of twisting in the mid abdomen to the right of the midline which involves a short segment of the sigmoid colon as well as the terminal ileum which may represent an internal hernia or volvulus. There is associated obstruction of the small bowel at the level  of the distal/terminal ileum (coronal 43/5 and axial 49/2) there is moderate stool throughout the colon. No pneumatosis or evidence of bowel ischemia at this time. Vascular/Lymphatic: The abdominal aorta and IVC unremarkable. No portal venous gas. There is no adenopathy. Reproductive: Prostate fiducial markers. Other: None Musculoskeletal: Osteopenia with degenerative changes of the spine. No acute osseous pathology. IMPRESSION: 1. Small-bowel obstruction with transition zone at the distal/terminal ileum secondary to volvulus or internal hernia. Surgical consult is advised. No pneumatosis or evidence of bowel ischemia at this time. 2. Multiple nonobstructing left renal calculi. Mild left hydronephrosis. 3. Thickened and trabeculated appearance of the bladder wall may be related to chronic bladder outlet obstruction or chronic bladder dysfunction. Correlation with  urinalysis recommended to exclude UTI. Electronically Signed   By: Elgie Collard M.D.   On: 02/28/2020 16:57    EKG: None.   Assessment/Plan Principal Problem:   SBO (small bowel obstruction) (HCC) Active Problems:   Intellectual disability   HTN (hypertension)   Diabetes mellitus type 2, noninsulin dependent (HCC)   BPH (benign prostatic hyperplasia)   Nonverbal   Small bowel obstruction-abdomen markedly distended, uneven and nontender.  Extensive abdominal history.  CT abd -transition zone distal/terminal ileum secondary to volvulus or internal hernia.  No pneumatosis or bowel ischemia.  - EDP talked with Dr. Lovell Sheehan, admits, will see in a.m. NG tube placed.  NG tube placed, and has drained ~ of dark liquid. -Obtain Gastroccult -Continue N/s 100cc/hr -Zofran as needed -Patient aunt does not want patient to have any more abdominal surgeries. -Soft restraints in place, historically patient tends to pull out his NG tube.  Incidental Covid positivity -does not appear to be having any symptoms.  Portable chest x-ray without acute abnormality.  Afebrile and asymptomatic.  Fully vaccinated for Covid with 2 doses Moderna back in March. -IV dexamethasone 6 mg daily, low threshold to discontinue as patient is asymptomatic and has diabetes -Obtain and trend inflammatory panel  Intellectual disability, nonverbal.  HTN-stable.  Home medications- tamsulosin only.  BPH-  - Hold tamsulosin while NPO  Diabetes mellitus-random glucose 168.  Hold home Metformin - SSI- S q8h -Monitor while on steroids   DVT prophylaxis: Heparin. Code Status: DNR.  CODE STATUS confirmed with patient's aunt.  Consistent with documentation from last hospitalization. Family Communication: None at bedside.  Talk to patient's aunt-contact #[phone] or use patient's niece number [phone].  All questions answered, plan of care explained. Disposition Plan:  ~ 2 days, pending resolution of  SBO. Consults called: General surgery. Admission status: Inpatient, MedSurg. I certify that at the point of admission it is my clinical judgment that the patient will require inpatient hospital care spanning beyond 2 midnights from the point of admission due to high intensity of service, high risk for further deterioration and high frequency of surveillance required. The following factors support the patient status of inpatient: SBO with transition point seen on CT, needs inpatient general surgery evaluation.   Onnie Boer MD [facility]  02/28/2020, 8:18 PM

## 2020-02-29 ENCOUNTER — Ambulatory Visit (HOSPITAL_COMMUNITY): Admission: RE | Admit: 2020-02-29 | Payer: Medicare Other | Source: Ambulatory Visit

## 2020-02-29 ENCOUNTER — Other Ambulatory Visit: Payer: Self-pay

## 2020-02-29 DIAGNOSIS — K56609 Unspecified intestinal obstruction, unspecified as to partial versus complete obstruction: Secondary | ICD-10-CM

## 2020-02-29 DIAGNOSIS — R140 Abdominal distension (gaseous): Secondary | ICD-10-CM | POA: Diagnosis not present

## 2020-02-29 DIAGNOSIS — U071 COVID-19: Secondary | ICD-10-CM | POA: Diagnosis not present

## 2020-02-29 DIAGNOSIS — I10 Essential (primary) hypertension: Secondary | ICD-10-CM

## 2020-02-29 DIAGNOSIS — N401 Enlarged prostate with lower urinary tract symptoms: Secondary | ICD-10-CM

## 2020-02-29 DIAGNOSIS — N138 Other obstructive and reflux uropathy: Secondary | ICD-10-CM

## 2020-02-29 DIAGNOSIS — E119 Type 2 diabetes mellitus without complications: Secondary | ICD-10-CM

## 2020-02-29 DIAGNOSIS — K562 Volvulus: Secondary | ICD-10-CM | POA: Diagnosis not present

## 2020-02-29 LAB — OCCULT BLOOD, POC DEVICE: Fecal Occult Bld: POSITIVE — AB

## 2020-02-29 LAB — CBC WITH DIFFERENTIAL/PLATELET
Abs Immature Granulocytes: 0.02 10*3/uL (ref 0.00–0.07)
Basophils Absolute: 0 10*3/uL (ref 0.0–0.1)
Basophils Relative: 0 %
Eosinophils Absolute: 0 10*3/uL (ref 0.0–0.5)
Eosinophils Relative: 0 %
HCT: 40.8 % (ref 39.0–52.0)
Hemoglobin: 12.3 g/dL — ABNORMAL LOW (ref 13.0–17.0)
Immature Granulocytes: 0 %
Lymphocytes Relative: 10 %
Lymphs Abs: 0.9 10*3/uL (ref 0.7–4.0)
MCH: 25.9 pg — ABNORMAL LOW (ref 26.0–34.0)
MCHC: 30.1 g/dL (ref 30.0–36.0)
MCV: 86.1 fL (ref 80.0–100.0)
Monocytes Absolute: 0.4 10*3/uL (ref 0.1–1.0)
Monocytes Relative: 4 %
Neutro Abs: 7.9 10*3/uL — ABNORMAL HIGH (ref 1.7–7.7)
Neutrophils Relative %: 86 %
Platelets: 232 10*3/uL (ref 150–400)
RBC: 4.74 MIL/uL (ref 4.22–5.81)
RDW: 17.6 % — ABNORMAL HIGH (ref 11.5–15.5)
WBC: 9.3 10*3/uL (ref 4.0–10.5)
nRBC: 0 % (ref 0.0–0.2)

## 2020-02-29 LAB — BASIC METABOLIC PANEL
Anion gap: 15 (ref 5–15)
BUN: 30 mg/dL — ABNORMAL HIGH (ref 8–23)
CO2: 26 mmol/L (ref 22–32)
Calcium: 9.3 mg/dL (ref 8.9–10.3)
Chloride: 98 mmol/L (ref 98–111)
Creatinine, Ser: 0.94 mg/dL (ref 0.61–1.24)
GFR, Estimated: >60 mL/min (ref >60)
Glucose, Bld: 165 mg/dL — ABNORMAL HIGH (ref 70–99)
Potassium: 4.2 mmol/L (ref 3.5–5.1)
Sodium: 139 mmol/L (ref 135–145)

## 2020-02-29 LAB — FERRITIN: Ferritin: 21 ng/mL — ABNORMAL LOW (ref 24–336)

## 2020-02-29 LAB — GLUCOSE, CAPILLARY
Glucose-Capillary: 116 mg/dL — ABNORMAL HIGH (ref 70–99)
Glucose-Capillary: 98 mg/dL (ref 70–99)
Glucose-Capillary: 109 mg/dL — ABNORMAL HIGH (ref 70–99)

## 2020-02-29 LAB — C-REACTIVE PROTEIN: CRP: 2 mg/dL — ABNORMAL HIGH (ref <1.0)

## 2020-02-29 LAB — D-DIMER, QUANTITATIVE: D-Dimer, Quant: 1.75 ug/mL-FEU — ABNORMAL HIGH (ref 0.00–0.50)

## 2020-02-29 LAB — CBG MONITORING, ED: Glucose-Capillary: 171 mg/dL — ABNORMAL HIGH (ref 70–99)

## 2020-02-29 MED ORDER — INFLUENZA VAC A&B SA ADJ QUAD 0.5 ML IM PRSY: 0.5000 mL | PREFILLED_SYRINGE | INTRAMUSCULAR | Status: DC

## 2020-02-29 MED ORDER — SIMETHICONE 40 MG/0.6ML PO SUSP
80.0000 mg | Freq: Four times a day (QID) | ORAL | Status: DC
  Administered 2020-02-29 – 2020-03-02 (×8): 80 mg
  Filled 2020-02-29 (×2): qty 0.6

## 2020-02-29 MED ORDER — SIMETHICONE 40 MG/0.6ML PO SUSP: 80.0000 mg | Freq: Four times a day (QID) | ORAL | Status: DC

## 2020-02-29 MED ORDER — HYDROMORPHONE HCL 1 MG/ML IJ SOLN
0.5000 mg | INTRAMUSCULAR | Status: DC | PRN
  Administered 2020-02-29: 0.5 mg via INTRAVENOUS

## 2020-02-29 MED ORDER — BISACODYL 10 MG RE SUPP
10.0000 mg | Freq: Two times a day (BID) | RECTAL | Status: DC
  Administered 2020-02-29 – 2020-03-04 (×8): 10 mg via RECTAL
  Filled 2020-02-29 (×9): qty 1

## 2020-02-29 MED ORDER — CHLORHEXIDINE GLUCONATE CLOTH 2 % EX PADS
6.0000 | MEDICATED_PAD | Freq: Every day | CUTANEOUS | Status: DC
  Administered 2020-02-29 – 2020-03-04 (×5): 6 via TOPICAL

## 2020-02-29 NOTE — Consult Note (Signed)
[facility] Consult  Reason for Consult: SBO, Possible volvulus/ internal hernia  Referring Physician:  Dr. Laural Benes   Chief Complaint    Abdominal Pain      HPI: [name] is a 69 y.o. male well known to me with chronic dysmotility and dilated bowel who had cholecystostomy tube placed for septic shock and acute cholecystitis 04/2018, Ex lap and reduction of small bowel volvulus 08/2018, and then final open cholecystectomy 05/2019 to get his cholecystostomy tube out and investigate possible pneumatosis of unknown etiology. During all of these procedures/ hospitalizations he has had a extended course and required SNF placement which has caused further issues and complications for the patient as he is none verbal.  He lives with his aunt, [name] who is his caretaker and Management consultant. [name] knows Khalon very well and has been keeping his bowel regular with suppositories and gives him regular simethicone for his chronic distention. At this baseline his abdomen is very distended and he has episodes when he has pain and [name] picks up on this pretty quickly. The last time he was in the hospital he had pneumoperitoneum of unknown etiology and was stable. We had no idea when this had developed or when it had formed as the patient had been brought to the hospital for a foley catheter I&O issue where [name] heard a pop and hematuria after her I&O attempt.    At that visit we discussed the chronic co-morbidities and [name]'s goal of keeping him comfortable. We discussed that additional surgeries carry high risk with this dilated bowel and distended abdomen, and that any operation could cause him more suffering and harm as he continued to get more frail. He was made DNR and we opted to monitor him and not seek aggressive measures at that time. CT with oral contrast demonstrated no leak and it was presumed he had a microperforation from his distention/ dilation of this bowel and that this  sealed.  The patient went home and has been doing well and eating and drinking.  Over the past 3 days he has not had a BM despite attempts at suppositories and his normal simethicone. He has been showing signs of some abdominal pain. He fell / became dizzy at church with [facility] daughter and he was brought to the ED.    He was found to have a dilated bowel and concern for possible volvulus versus internal hernia. He had an NG placed. He received both COVID shots but is COVID positive in the ED.  [name] reports he has had a chronic cough and congestion for several weeks now but she did not think it was COVID.   Past Medical History:  Diagnosis Date  . Diabetes mellitus without complication (HCC)   . History of gout   . History of kidney stones   . Hypertension   . Mentally challenged   . Urinary retention     Past Surgical History:  Procedure Laterality Date  . CHOLECYSTECTOMY N/A 05/25/2019   Procedure: OPEN CHOLECYSTECTOMY;  Surgeon: [name], MD;  Location: [facility];  Service: General;  Laterality: N/A;  . CYSTOSCOPY W/ URETERAL STENT PLACEMENT Bilateral 06/20/2016   Procedure: CYSTOSCOPY WITH RETROGRADE PYELOGRAM/URETERAL STENT PLACEMENT;  Surgeon: Malen Gauze, MD;  Location: [facility];  Service: Urology;  Laterality: Bilateral;  . CYSTOSCOPY WITH RETROGRADE PYELOGRAM, URETEROSCOPY AND STENT PLACEMENT Bilateral 08/20/2016   Procedure: CYSTOSCOPY WITH BILATERAL RETROGRADE PYELOGRAM, BILATERAL URETEROSCOPY,  LASER LITHOTRIPSY OF RIGHT URETERAL CALCULI, STONE BASKET EXTRACTION  LEFT URETERAL CALCULI  AND BILATERAL URETERAL STENT EXCHANGE;  Surgeon: Malen Gauze, MD;  Location: [facility];  Service: Urology;  Laterality: Bilateral;  . CYSTOSCOPY WITH RETROGRADE PYELOGRAM, URETEROSCOPY AND STENT PLACEMENT Left 01/14/2019   Procedure: CYSTOSCOPY WITH LEFT RETROGRADE PYELOGRAM, LEFT URETEROSCOPY AND LEFT URETERAL STENT PLACEMENT;  Surgeon: Malen Gauze, MD;  Location: [facility];   Service: Urology;  Laterality: Left;  . CYSTOSCOPY WITH URETHRAL DILATATION  01/14/2019   Procedure: CYSTOSCOPY WITH URETHRAL DILATATION;  Surgeon: Malen Gauze, MD;  Location: [facility];  Service: Urology;;  . HOLMIUM LASER APPLICATION Bilateral 08/20/2016   Procedure: HOLMIUM LASER APPLICATION;  Surgeon: Malen Gauze, MD;  Location: [facility];  Service: Urology;  Laterality: Bilateral;  . HOLMIUM LASER APPLICATION Left 01/14/2019   Procedure: HOLMIUM LASER APPLICATION;  Surgeon: Malen Gauze, MD;  Location: [facility];  Service: Urology;  Laterality: Left;  . IR CATHETER TUBE CHANGE  07/04/2018  . IR EXCHANGE BILIARY DRAIN  06/09/2018  . IR EXCHANGE BILIARY DRAIN  10/20/2018  . IR EXCHANGE BILIARY DRAIN  01/12/2019  . IR EXCHANGE BILIARY DRAIN  04/07/2019  . IR PERC CHOLECYSTOSTOMY  05/24/2018  . LAPAROTOMY N/A 08/25/2018   Procedure: EXPLORATORY LAPAROTOMY reduction of volvulus;  Surgeon: [name], MD;  Location: [facility];  Service: General;  Laterality: N/A;  . LAPAROTOMY N/A 05/25/2019   Procedure: EXPLORATORY LAPAROTOMY;  Surgeon: [name], MD;  Location: [facility];  Service: General;  Laterality: N/A;    Family History  Problem Relation Age of Onset  . Gallbladder disease Other     Social History   Tobacco Use  . Smoking status: Never Smoker  . Smokeless tobacco: Never Used  Vaping Use  . Vaping Use: Never used  Substance Use Topics  . Alcohol use: No  . Drug use: No    Medications:  I have reviewed the patient's current medications. Prior to Admission:  Medications Prior to Admission  Medication Sig Dispense Refill Last Dose  . acetaminophen (TYLENOL) 500 MG tablet Take 1,000 mg by mouth every 6 (six) hours as needed for mild pain or headache.   unk  . aspirin EC 81 MG tablet Take 1 tablet (81 mg total) by mouth daily with breakfast. 30 tablet 2 02/28/2020 at Unknown time  . atorvastatin (LIPITOR) 20 MG tablet Take 20 mg by mouth at bedtime.   Past Week  at Unknown time  . bisacodyl (DULCOLAX) 10 MG suppository Place 1 suppository (10 mg total) rectally daily as needed for moderate constipation. 30 suppository 0 Past Week at Unknown time  . finasteride (PROSCAR) 5 MG tablet Take 1 tablet (5 mg total) by mouth daily. 30 tablet 3 02/28/2020 at Unknown time  . magnesium oxide (MAG-OX) 400 MG tablet Take 1 tablet (400 mg total) by mouth daily. 30 tablet 0 02/28/2020 at Unknown time  . metFORMIN (GLUCOPHAGE) 850 MG tablet Take 1 tablet (850 mg total) by mouth 2 (two) times daily with a meal. (Patient taking differently: Take 1,000 mg by mouth 2 (two) times daily with a meal. ) 60 tablet 4 02/28/2020 at Unknown time  . Potassium Chloride ER 20 MEQ TBCR Take 20 mEq by mouth every Tuesday, Thursday, Saturday, and Sunday. 1 tab daily by mouth 20 tablet 0 02/28/2020 at Unknown time  . Simethicone (GAS-X PO) Take by mouth. Chewable - after each meal.   Past Week at Unknown time  . tamsulosin (FLOMAX) 0.4 MG CAPS capsule Take 1 capsule (  0.4 mg total) by mouth every evening. 30 capsule 3 Past Week at Unknown time  . linaclotide (LINZESS) 145 MCG CAPS capsule Take 1 capsule (145 mcg total) by mouth daily before breakfast. (Patient not taking: Reported on 02/29/2020) 30 capsule 5 Not Taking at Unknown time  . senna-docusate (SENOKOT-S) 8.6-50 MG tablet Take 2 tablets by mouth at bedtime. (Patient not taking: Reported on 02/29/2020) 60 tablet 2 Not Taking at Unknown time   Scheduled: . bisacodyl  10 mg Rectal BID  . Chlorhexidine Gluconate Cloth  6 each Topical Daily  . dexamethasone (DECADRON) injection  6 mg Intravenous Q24H  . heparin  5,000 Units Subcutaneous Q8H  . insulin aspart  0-9 Units Subcutaneous Q8H  . simethicone  80 mg Per Tube QID   Continuous: . sodium chloride 100 mL/hr at 02/29/20 0913   OMB:TDHRCBULAGTXM (DILAUDID) injection, ondansetron **OR** ondansetron (ZOFRAN) IV  No Known Allergies   ROS:  Review of systems not obtained due to  patient factors. Reported abdominal pain and no BM from [name]   Blood pressure 117/66, pulse (!) 4, temperature (!) 97.4 F (36.3 C), temperature source Oral, resp. rate 16, height 5\' 6"  (1.676 m), weight 59 kg, SpO2 95 %. Physical Exam Vitals reviewed.  Constitutional:      General: He is not in acute distress.    Appearance: He is well-developed.  HENT:     Head: Normocephalic.  Eyes:     Extraocular Movements: Extraocular movements intact.  Cardiovascular:     Rate and Rhythm: Normal rate.  Pulmonary:     Effort: Pulmonary effort is normal.  Abdominal:     General: Abdomen is protuberant. There is distension.     Palpations: Abdomen is soft.     Tenderness: There is abdominal tenderness. There is no guarding or rebound.     Hernia: No hernia is present.  Skin:    General: Skin is warm.  Neurological:     Mental Status: He is alert. Mental status is at baseline.     Motor: No weakness.  Psychiatric:        Mood and Affect: Mood normal.     Results: Results for orders placed or performed during the hospital encounter of 02/28/20 (from the past 48 hour(s))  Comprehensive metabolic panel     Status: Abnormal   Collection Time: 02/28/20  2:32 PM  Result Value Ref Range   Sodium 135 135 - 145 mmol/L   Potassium 4.4 3.5 - 5.1 mmol/L   Chloride 97 (L) 98 - 111 mmol/L   CO2 26 22 - 32 mmol/L   Glucose, Bld 168 (H) 70 - 99 mg/dL    Comment: Glucose reference range applies only to samples taken after fasting for at least 8 hours.   BUN 27 (H) 8 - 23 mg/dL   Creatinine, Ser 03/01/20 0.61 - 1.24 mg/dL   Calcium 4.68 (H) 8.9 - 10.3 mg/dL   Total Protein 9.4 (H) 6.5 - 8.1 g/dL   Albumin 4.5 3.5 - 5.0 g/dL   AST 22 15 - 41 U/L   ALT 17 0 - 44 U/L   Alkaline Phosphatase 84 38 - 126 U/L   Total Bilirubin 0.6 0.3 - 1.2 mg/dL   GFR, Estimated 03.2 >12 mL/min    Comment: (NOTE) Calculated using the CKD-EPI Creatinine Equation (2021)    Anion gap 12 5 - 15    Comment: Performed at  [facility], [address], [place], [place]  CBC with  Differential     Status: Abnormal   Collection Time: 02/28/20  2:32 PM  Result Value Ref Range   WBC 8.1 4.0 - 10.5 K/uL   RBC 5.25 4.22 - 5.81 MIL/uL   Hemoglobin 13.7 13.0 - 17.0 g/dL   HCT 96.0 39 - 52 %   MCV 87.2 80.0 - 100.0 fL   MCH 26.1 26.0 - 34.0 pg   MCHC 29.9 (L) 30.0 - 36.0 g/dL   RDW 45.4 (H) 09.8 - 11.9 %   Platelets 232 150 - 400 K/uL   nRBC 0.0 0.0 - 0.2 %   Neutrophils Relative % 81 %   Neutro Abs 6.6 1.7 - 7.7 K/uL   Lymphocytes Relative 12 %   Lymphs Abs 0.9 0.7 - 4.0 K/uL   Monocytes Relative 7 %   Monocytes Absolute 0.5 0.1 - 1.0 K/uL   Eosinophils Relative 0 %   Eosinophils Absolute 0.0 0.0 - 0.5 K/uL   Basophils Relative 0 %   Basophils Absolute 0.0 0.0 - 0.1 K/uL   Immature Granulocytes 0 %   Abs Immature Granulocytes 0.02 0.00 - 0.07 K/uL    Comment: Performed at [facility], [address] [place], [place] [address]  Ferritin     Status: Abnormal   Collection Time: 02/28/20  2:32 PM  Result Value Ref Range   Ferritin 20 (L) 24 - 336 ng/mL    Comment: Performed at [facility], [address] [place], [place] [address]  C-reactive protein     Status: Abnormal   Collection Time: 02/28/20  2:32 PM  Result Value Ref Range   CRP 1.5 (H) <1.0 mg/dL    Comment: Performed at [name], [address], [place], [place] [address]  Respiratory Panel by RT PCR (Flu A&B, Covid) - Nasopharyngeal Swab     Status: Abnormal   Collection Time: 02/28/20  5:06 PM   Specimen: Nasopharyngeal Swab  Result Value Ref Range   SARS Coronavirus 2 by RT PCR POSITIVE (A) NEGATIVE    Comment: CRITICAL RESULT CALLED TO, READ BACK BY AND VERIFIED WITH: R GENTRY AT 1857 ON 02/28/2020 BY MOSLEY,J (NOTE) SARS-CoV-2 target nucleic acids are DETECTED.  SARS-CoV-2 RNA is generally detectable in upper respiratory specimens  during the acute phase of infection. Positive results are indicative of the presence of  the identified virus, but do not rule out bacterial infection or co-infection with other pathogens not detected by the test. Clinical correlation with patient history and other diagnostic information is necessary to determine patient infection status. The expected result is Negative.  Fact Sheet for Patients:  https://www.moore.com/  Fact Sheet for Healthcare Providers: https://www.young.biz/  This test is not yet approved or cleared by the [place] FDA and  has been authorized for detection and/or diagnosis of SARS-CoV-2 by FDA under an Emergency Use Authorization (EUA).  This EUA will remain in effect (meaning  this test can be used) for the duration of  the COVID-19 declaration under Section 564(b)(1) of the Act, 21 U.S.C. section 360bbb-3(b)(1), unless the authorization is terminated or revoked sooner.      Influenza A by PCR NEGATIVE NEGATIVE   Influenza B by PCR NEGATIVE NEGATIVE    Comment: (NOTE) The Xpert Xpress SARS-CoV-2/FLU/RSV assay is intended as an aid in  the diagnosis of influenza from Nasopharyngeal swab specimens and  should not be used as a sole basis for treatment. Nasal washings and  aspirates are unacceptable for Xpert Xpress SARS-CoV-2/FLU/RSV  testing.  Fact Sheet  for Patients: https://www.moore.com/  Fact Sheet for Healthcare Providers: https://www.young.biz/  This test is not yet approved or cleared by the [place] FDA and  has been authorized for detection and/or diagnosis of SARS-CoV-2 by  FDA under an Emergency Use Authorization (EUA). This EUA will remain  in effect (meaning this test can be used) for the duration of the  Covid-19 declaration under Section 564(b)(1) of the Act, 21  U.S.C. section 360bbb-3(b)(1), unless the authorization is  terminated or revoked. Performed at [facility], [address] [place], [place] [address]   Lactate dehydrogenase      Status: None   Collection Time: 02/28/20  7:15 PM  Result Value Ref Range   LDH 190 98 - 192 U/L    Comment: Performed at [facility], [address], [place], [place] [address]  Occult blood, poc device     Status: Abnormal   Collection Time: 02/28/20  7:42 PM  Result Value Ref Range   Fecal Occult Bld POSITIVE (A) NEGATIVE  D-dimer, quantitative (not at [facility])     Status: Abnormal   Collection Time: 02/28/20  8:43 PM  Result Value Ref Range   D-Dimer, Quant 1.84 (H) 0.00 - 0.50 ug/mL-FEU    Comment: (NOTE) At the manufacturer cut-off value of 0.5 g/mL FEU, this assay has a negative predictive value of 95-100%.This assay is intended for use in conjunction with a clinical pretest probability (PTP) assessment model to exclude pulmonary embolism (PE) and deep venous thrombosis (DVT) in outpatients suspected of PE or DVT. Results should be correlated with clinical presentation. Performed at [facility], [address] [place], [place] [address]   Fibrinogen     Status: Abnormal   Collection Time: 02/28/20  8:43 PM  Result Value Ref Range   Fibrinogen 666 (H) 210 - 475 mg/dL    Comment: Performed at [facility], [address], [place], [place] [address]  Urinalysis, Routine w reflex microscopic Urine, Catheterized     Status: Abnormal   Collection Time: 02/28/20 10:15 PM  Result Value Ref Range   Color, Urine AMBER (A) YELLOW    Comment: BIOCHEMICALS MAY BE AFFECTED BY COLOR   APPearance CLOUDY (A) CLEAR   Specific Gravity, Urine >1.046 (H) 1.005 - 1.030   pH 7.0 5.0 - 8.0   Glucose, UA NEGATIVE NEGATIVE mg/dL   Hgb urine dipstick SMALL (A) NEGATIVE   Bilirubin Urine NEGATIVE NEGATIVE   Ketones, ur 20 (A) NEGATIVE mg/dL   Protein, ur 355 (A) NEGATIVE mg/dL   Nitrite NEGATIVE NEGATIVE   Leukocytes,Ua NEGATIVE NEGATIVE   RBC / HPF 11-20 0 - 5 RBC/hpf   WBC, UA 21-50 0 - 5 WBC/hpf   Bacteria, UA RARE (A) NONE SEEN   WBC Clumps PRESENT    Mucus PRESENT    Non Squamous  Epithelial 0-5 (A) NONE SEEN    Comment: Performed at [facility], [address] [place], [place] [address]  CBG monitoring, ED     Status: Abnormal   Collection Time: 02/28/20 11:04 PM  Result Value Ref Range   Glucose-Capillary 182 (H) 70 - 99 mg/dL    Comment: Glucose reference range applies only to samples taken after fasting for at least 8 hours.  CBC with Differential/Platelet     Status: Abnormal   Collection Time: 02/29/20  5:21 AM  Result Value Ref Range   WBC 9.3 4.0 - 10.5 K/uL   RBC 4.74 4.22 - 5.81 MIL/uL   Hemoglobin 12.3 (L) 13.0 -  17.0 g/dL   HCT 54.040.8 39 - 52 %   MCV 86.1 80.0 - 100.0 fL   MCH 25.9 (L) 26.0 - 34.0 pg   MCHC 30.1 30.0 - 36.0 g/dL   RDW 98.117.6 (H) 19.111.5 - 47.815.5 %   Platelets 232 150 - 400 K/uL   nRBC 0.0 0.0 - 0.2 %   Neutrophils Relative % 86 %   Neutro Abs 7.9 (H) 1.7 - 7.7 K/uL   Lymphocytes Relative 10 %   Lymphs Abs 0.9 0.7 - 4.0 K/uL   Monocytes Relative 4 %   Monocytes Absolute 0.4 0.1 - 1.0 K/uL   Eosinophils Relative 0 %   Eosinophils Absolute 0.0 0.0 - 0.5 K/uL   Basophils Relative 0 %   Basophils Absolute 0.0 0.0 - 0.1 K/uL   Immature Granulocytes 0 %   Abs Immature Granulocytes 0.02 0.00 - 0.07 K/uL    Comment: Performed at [facility], [address] [place], [place] [address]  D-dimer, quantitative (not at [facility])     Status: Abnormal   Collection Time: 02/29/20  5:21 AM  Result Value Ref Range   D-Dimer, Quant 1.75 (H) 0.00 - 0.50 ug/mL-FEU    Comment: (NOTE) At the manufacturer cut-off value of 0.5 g/mL FEU, this assay has a negative predictive value of 95-100%.This assay is intended for use in conjunction with a clinical pretest probability (PTP) assessment model to exclude pulmonary embolism (PE) and deep venous thrombosis (DVT) in outpatients suspected of PE or DVT. Results should be correlated with clinical presentation. Performed at [facility], [address] [place], [place] [address]   Basic metabolic panel      Status: Abnormal   Collection Time: 02/29/20  5:21 AM  Result Value Ref Range   Sodium 139 135 - 145 mmol/L   Potassium 4.2 3.5 - 5.1 mmol/L   Chloride 98 98 - 111 mmol/L   CO2 26 22 - 32 mmol/L   Glucose, Bld 165 (H) 70 - 99 mg/dL    Comment: Glucose reference range applies only to samples taken after fasting for at least 8 hours.   BUN 30 (H) 8 - 23 mg/dL   Creatinine, Ser 5.780.94 0.61 - 1.24 mg/dL   Calcium 9.3 8.9 - 46.910.3 mg/dL   GFR, Estimated >62>60 >95>60 mL/min    Comment: (NOTE) Calculated using the CKD-EPI Creatinine Equation (2021)    Anion gap 15 5 - 15    Comment: Performed at [facility], [address] [place], [place] [address]  C-reactive protein     Status: Abnormal   Collection Time: 02/29/20  5:22 AM  Result Value Ref Range   CRP 2.0 (H) <1.0 mg/dL    Comment: Performed at [facility], [address] [place], [place] [address]  Ferritin     Status: Abnormal   Collection Time: 02/29/20  5:22 AM  Result Value Ref Range   Ferritin 21 (L) 24 - 336 ng/mL    Comment: Performed at [facility], [address] [place], [place] [address]  CBG monitoring, ED     Status: Abnormal   Collection Time: 02/29/20  6:38 AM  Result Value Ref Range   Glucose-Capillary 171 (H) 70 - 99 mg/dL    Comment: Glucose reference range applies only to samples taken after fasting for at least 8 hours.  Glucose, capillary     Status: Abnormal   Collection Time: 02/29/20 11:43 AM  Result Value Ref Range   Glucose-Capillary 116 (H) 70 - 99  mg/dL    Comment: Glucose reference range applies only to samples taken after fasting for at least 8 hours.   Personally reviewed CT- sigmoid colon does go up from pelvis and toward right lower quadrant, I lose the sigmoid but it does have a tetheter appearance close to the terminal ileum, diffusely dilated small bowel and stool in the rectal vault and colon, no free air or free fluid  CT Abdomen Pelvis W Contrast  Result Date: 02/28/2020 CLINICAL DATA:   69 year old male with abdominal distension. EXAM: CT ABDOMEN AND PELVIS WITH CONTRAST TECHNIQUE: Multidetector CT imaging of the abdomen and pelvis was performed using the standard protocol following bolus administration of intravenous contrast. CONTRAST:  OMNIPAQUE IOHEXOL 300 MG/ML  SOLN COMPARISON:  CT abdomen pelvis dated 07/06/2019. FINDINGS: Lower chest: There are bibasilar atelectasis. No intra-abdominal free air or free fluid. Hepatobiliary: The liver is unremarkable. No intrahepatic biliary dilatation. Cholecystectomy. Pancreas: Unremarkable. No pancreatic ductal dilatation or surrounding inflammatory changes. Spleen: Normal in size without focal abnormality. Adrenals/Urinary Tract: The adrenal glands unremarkable. There is a 12 mm nonobstructing left renal inferior pole calculus. Additional smaller nonobstructing stones noted in the inferior pole of the left kidney. There is a 3 mm nonobstructing calculus in the left renal pelvis. There is mild left hydronephrosis. The right kidney is unremarkable. The visualized ureters appear unremarkable. There is diffuse thickened and trabeculated appearance of the bladder wall which may be related to chronic bladder outlet obstruction or chronic bladder dysfunction. Correlation with urinalysis recommended to exclude UTI Stomach/Bowel: Moderate to severe gastric distension. There is ingested content in the distal esophagus, likely reflux. Multiple dilated and fluid-filled loops of small bowel with air-fluid levels noted throughout the abdomen measuring up to 9.5 cm in the right upper quadrant. There is an area of twisting in the mid abdomen to the right of the midline which involves a short segment of the sigmoid colon as well as the terminal ileum which may represent an internal hernia or volvulus. There is associated obstruction of the small bowel at the level of the distal/terminal ileum (coronal 43/5 and axial 49/2) there is moderate stool throughout the  colon. No pneumatosis or evidence of bowel ischemia at this time. Vascular/Lymphatic: The abdominal aorta and IVC unremarkable. No portal venous gas. There is no adenopathy. Reproductive: Prostate fiducial markers. Other: None Musculoskeletal: Osteopenia with degenerative changes of the spine. No acute osseous pathology. IMPRESSION: 1. Small-bowel obstruction with transition zone at the distal/terminal ileum secondary to volvulus or internal hernia. Surgical consult is advised. No pneumatosis or evidence of bowel ischemia at this time. 2. Multiple nonobstructing left renal calculi. Mild left hydronephrosis. 3. Thickened and trabeculated appearance of the bladder wall may be related to chronic bladder outlet obstruction or chronic bladder dysfunction. Correlation with urinalysis recommended to exclude UTI. Electronically Signed   By: Elgie Collard M.D.   On: 02/28/2020 16:57   DG Chest Portable 1 View  Result Date: 02/28/2020 CLINICAL DATA:  69 year old male with NG placement. EXAM: PORTABLE CHEST 1 VIEW COMPARISON:  Radiograph dated 07/05/2019. FINDINGS: An enteric tube with side-port in the distal esophagus and tip just distal to the GE junction. Recommend further advancing by additional 10 cm. Bibasilar atelectasis. No focal consolidation, pleural effusion or pneumothorax. The cardiac silhouette is within limits. Atherosclerotic calcification of the aorta. No acute osseous pathology. Degenerative changes of the spine. Distended stomach and loops of bowel in the visualized upper abdomen. Clinical correlation is recommended. IMPRESSION: 1. Enteric tube with side-port  in the distal esophagus and tip just distal to the GE junction. Recommend further advancing by additional 10 cm. 2. Bibasilar atelectasis. Electronically Signed   By: Elgie Collard M.D.   On: 02/28/2020 18:39     Assessment & Plan:  KAYD LAUNER is a 69 y.o. male with SBO from possible volvulus versus internal hernia versus it could  just be from scar given difficulty following the bowel. [name] has previously decided to not pursue aggressive surgical interventions in [place] due to this comorbidities and the suffering and need for stays in SNF after surgery. He has been DNR since May 2021.  Discussed that now he is COVID positive and that this complicates the issue further and only makes his risk complications and difficulty with recovery worse. Discussed that he try to avoid surgery with COVID patient's unless necessary but that with him even without COVID any surgeries are unlikely to ultimately change the fact that he will continue to have issues with his bowel. [name] wants Jahbari to be comfortable and to not suffer.   Currently his abdomen is soft and reassuring. He has no leukocytosis or signs of any bowel compromise. He had some tenderness but is was not in any great distress. I do not think a large exploration is likely to result in a positive outcome especially in the setting of a new COVID diagnosis.   -Palliative to see patient and discuss further with [name] -NG in place, suppositories ordered, simethicone ordered  -Will see how bowels respond and may ultimately repeat some imaging, if he does not improve and we are unable to feed him discussed that he may need full comfort measures with [name]. She is in agreement.   All questions were answered to the satisfaction of the family. Discussed with Dr. Laural Benes, Lillia Carmel NP and RN.    [name] 02/29/2020, 12:28 PM

## 2020-02-29 NOTE — Plan of Care (Signed)
Palliative: Thank you for this consult. Unfortunately due to high volume of consults there will be a delay in a Palliative Provider seeing this patient. Palliative Medicine will return to service on 03/01/2020 and will see patient at that time. Conference with attending, surgeon, bedside nursing staff, and TOC related to patient condition, needs.  Lillia Carmel, NP Palliative Medicine Please call Palliative Medicine team phone with any questions [phone]. For individual providers please see AMION. No charge.

## 2020-02-29 NOTE — Plan of Care (Signed)
  Problem: Nutrition Goal: Patient maintains adequate hydration Outcome: Not Progressing Goal: Patient maintains weight Outcome: Not Progressing Goal: Patient/Family demonstrates understanding of diet Outcome: Not Progressing Goal: Patient/Family independently completes tube feeding Outcome: Not Progressing Goal: Patient will have no more than 5 lb weight change during LOS Outcome: Not Progressing Goal: Patient will utilize adaptive techniques to administer nutrition Outcome: Not Progressing Goal: Patient will verbalize dietary restrictions Outcome: Not Progressing   Problem: Education: Goal: Knowledge of General Education information will improve Description: Including pain rating scale, medication(s)/side effects and non-pharmacologic comfort measures Outcome: Not Progressing   Problem: Health Behavior/Discharge Planning: Goal: Ability to manage health-related needs will improve Outcome: Not Progressing   Problem: Clinical Measurements: Goal: Ability to maintain clinical measurements within normal limits will improve Outcome: Not Progressing Goal: Will remain free from infection Outcome: Not Progressing Goal: Diagnostic test results will improve Outcome: Not Progressing Goal: Respiratory complications will improve Outcome: Not Progressing Goal: Cardiovascular complication will be avoided Outcome: Not Progressing   Problem: Activity: Goal: Risk for activity intolerance will decrease Outcome: Not Progressing   Problem: Nutrition: Goal: Adequate nutrition will be maintained Outcome: Not Progressing   Problem: Coping: Goal: Level of anxiety will decrease Outcome: Not Progressing   Problem: Elimination: Goal: Will not experience complications related to bowel motility Outcome: Not Progressing Goal: Will not experience complications related to urinary retention Outcome: Not Progressing   Problem: Pain Managment: Goal: General experience of comfort will  improve Outcome: Not Progressing   Problem: Safety: Goal: Ability to remain free from injury will improve Outcome: Not Progressing   Problem: Skin Integrity: Goal: Risk for impaired skin integrity will decrease Outcome: Not Progressing   

## 2020-02-29 NOTE — Progress Notes (Signed)
PROGRESS NOTE   [name]  ZOX:[id number] DOB: February 05, 1951 DOA: 02/28/2020 PCP: Pearson Grippe, MD   Chief Complaint  Patient presents with  . Abdominal Pain    Brief Admission History:  69 y.o. male with medical history significant for intermittent disability, hypertension, BPH, bilateral hydronephrosis, and extensive abdominal history including gangrenous cholecystitis, pneumo intestinalis, ileus, pneumoperitoneum.  History is obtained from chart review and patient's aunt.  Patient is nonverbal at baseline.  Patient was brought to the ED with complaints of abdominal swelling of 3 days duration.  Patient lives with his aunt [name], ED provider was able to talk to her on the phone.  Patient's last bowel movement was 3 days ago.  She gave patient a suppository but this did not help.  Patient had also had ongoing back pain for several weeks.  He was admitted with abdominal distention and small bowel obstruction.    Assessment & Plan:   Principal Problem:   SBO (small bowel obstruction) (HCC) Active Problems:   Intellectual disability   HTN (hypertension)   Diabetes mellitus type 2, noninsulin dependent (HCC)   BPH (benign prostatic hyperplasia)   Nonverbal   COVID-19 virus infection  1. SBO - Pt has NG in place with bilious output.  He is being hydrated with NS. IV zofran in place. Soft restraints as needed given his difficulty in the past with tolerating NG.  IV pain meds ordered.  He is NPO.  With his history of multiple abdominal surgery and family's desire for no further surgery I have asked for palliative consult for goals of care.  2. Covid infection - he is asymptomatic. He has been fully vaccinated with Moderna.  He was started on IV dexamethasone in ED.  3. Essential hypertension - stable BPs, following.  4. BPH - he has been maintained on tamsulosin.  5. Type 2 DM - continue SSI coverage and CBG monitoring. 6. Abnormal UA - obtain urine culture.   DVT prophylaxis:  SQ  heparin  Code Status:  DNR  Family Communication: t/c to aunt Disposition: TBD   Status is: Inpatient  Remains inpatient appropriate because:IV treatments appropriate due to intensity of illness or inability to take PO and Inpatient level of care appropriate due to severity of illness  Dispo: The patient is from: Home              Anticipated d/c is to: TBD              Anticipated d/c date is: 2 days              Patient currently is not medically stable to d/c.  Consultants:   GS  Palliative   Procedures:   NG placement   Antimicrobials:    Subjective: Pt nonverbal at baseline  Objective: Vitals:   02/29/20 0730 02/29/20 0800 02/29/20 0830 02/29/20 0900  BP: 130/70 114/70 117/66 117/66  Pulse: (!) 108 97 92 (!) 4  Resp: 20 13 13 16   Temp:    (!) 97.4 F (36.3 C)  TempSrc:    Oral  SpO2: 98% 99% 100% 95%  Weight:      Height:        Intake/Output Summary (Last 24 hours) at 02/29/2020 1008 Last data filed at 02/29/2020 0913 Gross per 24 hour  Intake 0 ml  Output 2600 ml  Net -2600 ml   Filed Weights   02/28/20 1344  Weight: 59 kg   Examination:  General exam: Appears calm and comfortable.  NG in place.  Respiratory system:  Respiratory effort normal. Cardiovascular system: normal S1 & S2 heard.  Gastrointestinal system: Abdomen is deformed appearing with multiple scars, distended, soft. No organomegaly or masses felt. Hyperactive bowel sounds heard. Central nervous system: awake.  Extremities: Symmetric 5 x 5 power. Skin: No grossly apparent rashes, lesions or ulcers Psychiatry: unable to assess.    Data Reviewed: I have personally reviewed following labs and imaging studies  CBC: Recent Labs  Lab 02/28/20 1432 02/29/20 0521  WBC 8.1 9.3  NEUTROABS 6.6 7.9*  HGB 13.7 12.3*  HCT 45.8 40.8  MCV 87.2 86.1  PLT 232 232    Basic Metabolic Panel: Recent Labs  Lab 02/28/20 1432 02/29/20 0521  NA 135 139  K 4.4 4.2  CL 97* 98  CO2 26 26    GLUCOSE 168* 165*  BUN 27* 30*  CREATININE 1.11 0.94  CALCIUM 10.5* 9.3    GFR: Estimated Creatinine Clearance: 61 mL/min (by C-G formula based on SCr of 0.94 mg/dL).  Liver Function Tests: Recent Labs  Lab 02/28/20 1432  AST 22  ALT 17  ALKPHOS 84  BILITOT 0.6  PROT 9.4*  ALBUMIN 4.5    CBG: Recent Labs  Lab 02/28/20 2304 02/29/20 0638  GLUCAP 182* 171*    Recent Results (from the past 240 hour(s))  Respiratory Panel by RT PCR (Flu A&B, Covid) - Nasopharyngeal Swab     Status: Abnormal   Collection Time: 02/28/20  5:06 PM   Specimen: Nasopharyngeal Swab  Result Value Ref Range Status   SARS Coronavirus 2 by RT PCR POSITIVE (A) NEGATIVE Final    Comment: CRITICAL RESULT CALLED TO, READ BACK BY AND VERIFIED WITH: R GENTRY AT 1857 ON 02/28/2020 BY MOSLEY,J (NOTE) SARS-CoV-2 target nucleic acids are DETECTED.  SARS-CoV-2 RNA is generally detectable in upper respiratory specimens  during the acute phase of infection. Positive results are indicative of the presence of the identified virus, but do not rule out bacterial infection or co-infection with other pathogens not detected by the test. Clinical correlation with patient history and other diagnostic information is necessary to determine patient infection status. The expected result is Negative.  Fact Sheet for Patients:  https://www.moore.com/  Fact Sheet for Healthcare Providers: https://www.young.biz/  This test is not yet approved or cleared by the [place] FDA and  has been authorized for detection and/or diagnosis of SARS-CoV-2 by FDA under an Emergency Use Authorization (EUA).  This EUA will remain in effect (meaning  this test can be used) for the duration of  the COVID-19 declaration under Section 564(b)(1) of the Act, 21 U.S.C. section 360bbb-3(b)(1), unless the authorization is terminated or revoked sooner.      Influenza A by PCR NEGATIVE NEGATIVE  Final   Influenza B by PCR NEGATIVE NEGATIVE Final    Comment: (NOTE) The Xpert Xpress SARS-CoV-2/FLU/RSV assay is intended as an aid in  the diagnosis of influenza from Nasopharyngeal swab specimens and  should not be used as a sole basis for treatment. Nasal washings and  aspirates are unacceptable for Xpert Xpress SARS-CoV-2/FLU/RSV  testing.  Fact Sheet for Patients: https://www.moore.com/  Fact Sheet for Healthcare Providers: https://www.young.biz/  This test is not yet approved or cleared by the [place] FDA and  has been authorized for detection and/or diagnosis of SARS-CoV-2 by  FDA under an Emergency Use Authorization (EUA). This EUA will remain  in effect (meaning this test can be used) for the duration of the  Covid-19 declaration  under Section 564(b)(1) of the Act, 21  U.S.C. section 360bbb-3(b)(1), unless the authorization is  terminated or revoked. Performed at [facility], [address] [place], [place] [address]      Radiology Studies: CT Abdomen Pelvis W Contrast  Result Date: 02/28/2020 CLINICAL DATA:  69 year old male with abdominal distension. EXAM: CT ABDOMEN AND PELVIS WITH CONTRAST TECHNIQUE: Multidetector CT imaging of the abdomen and pelvis was performed using the standard protocol following bolus administration of intravenous contrast. CONTRAST:  100mL OMNIPAQUE IOHEXOL 300 MG/ML  SOLN COMPARISON:  CT abdomen pelvis dated 07/06/2019. FINDINGS: Lower chest: There are bibasilar atelectasis. No intra-abdominal free air or free fluid. Hepatobiliary: The liver is unremarkable. No intrahepatic biliary dilatation. Cholecystectomy. Pancreas: Unremarkable. No pancreatic ductal dilatation or surrounding inflammatory changes. Spleen: Normal in size without focal abnormality. Adrenals/Urinary Tract: The adrenal glands unremarkable. There is a 12 mm nonobstructing left renal inferior pole calculus. Additional smaller  nonobstructing stones noted in the inferior pole of the left kidney. There is a 3 mm nonobstructing calculus in the left renal pelvis. There is mild left hydronephrosis. The right kidney is unremarkable. The visualized ureters appear unremarkable. There is diffuse thickened and trabeculated appearance of the bladder wall which may be related to chronic bladder outlet obstruction or chronic bladder dysfunction. Correlation with urinalysis recommended to exclude UTI Stomach/Bowel: Moderate to severe gastric distension. There is ingested content in the distal esophagus, likely reflux. Multiple dilated and fluid-filled loops of small bowel with air-fluid levels noted throughout the abdomen measuring up to 9.5 cm in the right upper quadrant. There is an area of twisting in the mid abdomen to the right of the midline which involves a short segment of the sigmoid colon as well as the terminal ileum which may represent an internal hernia or volvulus. There is associated obstruction of the small bowel at the level of the distal/terminal ileum (coronal 43/5 and axial 49/2) there is moderate stool throughout the colon. No pneumatosis or evidence of bowel ischemia at this time. Vascular/Lymphatic: The abdominal aorta and IVC unremarkable. No portal venous gas. There is no adenopathy. Reproductive: Prostate fiducial markers. Other: None Musculoskeletal: Osteopenia with degenerative changes of the spine. No acute osseous pathology. IMPRESSION: 1. Small-bowel obstruction with transition zone at the distal/terminal ileum secondary to volvulus or internal hernia. Surgical consult is advised. No pneumatosis or evidence of bowel ischemia at this time. 2. Multiple nonobstructing left renal calculi. Mild left hydronephrosis. 3. Thickened and trabeculated appearance of the bladder wall may be related to chronic bladder outlet obstruction or chronic bladder dysfunction. Correlation with urinalysis recommended to exclude UTI.  Electronically Signed   By: Elgie CollardArash  Radparvar M.D.   On: 02/28/2020 16:57   DG Chest Portable 1 View  Result Date: 02/28/2020 CLINICAL DATA:  69 year old male with NG placement. EXAM: PORTABLE CHEST 1 VIEW COMPARISON:  Radiograph dated 07/05/2019. FINDINGS: An enteric tube with side-port in the distal esophagus and tip just distal to the GE junction. Recommend further advancing by additional 10 cm. Bibasilar atelectasis. No focal consolidation, pleural effusion or pneumothorax. The cardiac silhouette is within limits. Atherosclerotic calcification of the aorta. No acute osseous pathology. Degenerative changes of the spine. Distended stomach and loops of bowel in the visualized upper abdomen. Clinical correlation is recommended. IMPRESSION: 1. Enteric tube with side-port in the distal esophagus and tip just distal to the GE junction. Recommend further advancing by additional 10 cm. 2. Bibasilar atelectasis. Electronically Signed   By: Elgie CollardArash  Radparvar M.D.   On: 02/28/2020  18:39   Scheduled Meds: . Chlorhexidine Gluconate Cloth  6 each Topical Daily  . dexamethasone (DECADRON) injection  6 mg Intravenous Q24H  . heparin  5,000 Units Subcutaneous Q8H  . insulin aspart  0-9 Units Subcutaneous Q8H   Continuous Infusions: . sodium chloride 100 mL/hr at 02/29/20 0913    LOS: 1 day   Time spent: 38 mins   Zhi Geier Laural Benes, MD How to contact the [facility] Attending or Consulting provider 7A - 7P or covering provider during after hours 7P -7A, for this patient?  1. Check the care team in [facility] and look for a) attending/consulting TR[facility] provider listed and b) the [facility] team listed 2. Log into [email or web address] and use [facility]'s universal password to access. If you do not have the password, please contact the hospital operator. 3. Locate the [facility] provider you are looking for under [facility] and page to a number that you can be directly reached. 4. If you still have difficulty reaching the provider, please  page the [facility] (Director on Call) for the Hospitalists listed on amion for assistance.  02/29/2020, 10:08 AM

## 2020-03-01 ENCOUNTER — Inpatient Hospital Stay (HOSPITAL_COMMUNITY): Payer: Medicare Other

## 2020-03-01 ENCOUNTER — Encounter (HOSPITAL_COMMUNITY): Payer: Self-pay | Admitting: Family Medicine

## 2020-03-01 DIAGNOSIS — Z7189 Other specified counseling: Secondary | ICD-10-CM | POA: Diagnosis not present

## 2020-03-01 DIAGNOSIS — E119 Type 2 diabetes mellitus without complications: Secondary | ICD-10-CM | POA: Diagnosis not present

## 2020-03-01 DIAGNOSIS — Z515 Encounter for palliative care: Secondary | ICD-10-CM | POA: Diagnosis not present

## 2020-03-01 DIAGNOSIS — R140 Abdominal distension (gaseous): Secondary | ICD-10-CM | POA: Diagnosis not present

## 2020-03-01 DIAGNOSIS — K562 Volvulus: Secondary | ICD-10-CM | POA: Diagnosis not present

## 2020-03-01 DIAGNOSIS — U071 COVID-19: Secondary | ICD-10-CM | POA: Diagnosis not present

## 2020-03-01 DIAGNOSIS — K56609 Unspecified intestinal obstruction, unspecified as to partial versus complete obstruction: Secondary | ICD-10-CM | POA: Diagnosis not present

## 2020-03-01 DIAGNOSIS — N401 Enlarged prostate with lower urinary tract symptoms: Secondary | ICD-10-CM | POA: Diagnosis not present

## 2020-03-01 LAB — CBC WITH DIFFERENTIAL/PLATELET
Abs Immature Granulocytes: 0.02 10*3/uL (ref 0.00–0.07)
Basophils Absolute: 0 10*3/uL (ref 0.0–0.1)
Basophils Relative: 0 %
Eosinophils Absolute: 0 10*3/uL (ref 0.0–0.5)
Eosinophils Relative: 0 %
HCT: 37 % — ABNORMAL LOW (ref 39.0–52.0)
Hemoglobin: 10.8 g/dL — ABNORMAL LOW (ref 13.0–17.0)
Immature Granulocytes: 0 %
Lymphocytes Relative: 12 %
Lymphs Abs: 1 10*3/uL (ref 0.7–4.0)
MCH: 25.9 pg — ABNORMAL LOW (ref 26.0–34.0)
MCHC: 29.2 g/dL — ABNORMAL LOW (ref 30.0–36.0)
MCV: 88.7 fL (ref 80.0–100.0)
Monocytes Absolute: 0.5 10*3/uL (ref 0.1–1.0)
Monocytes Relative: 6 %
Neutro Abs: 6.6 10*3/uL (ref 1.7–7.7)
Neutrophils Relative %: 82 %
Platelets: 189 10*3/uL (ref 150–400)
RBC: 4.17 MIL/uL — ABNORMAL LOW (ref 4.22–5.81)
RDW: 17.6 % — ABNORMAL HIGH (ref 11.5–15.5)
WBC: 8.1 10*3/uL (ref 4.0–10.5)
nRBC: 0 % (ref 0.0–0.2)

## 2020-03-01 LAB — GLUCOSE, CAPILLARY
Glucose-Capillary: 171 mg/dL — ABNORMAL HIGH (ref 70–99)
Glucose-Capillary: 124 mg/dL — ABNORMAL HIGH (ref 70–99)
Glucose-Capillary: 108 mg/dL — ABNORMAL HIGH (ref 70–99)

## 2020-03-01 LAB — D-DIMER, QUANTITATIVE: D-Dimer, Quant: 1.91 ug/mL-FEU — ABNORMAL HIGH (ref 0.00–0.50)

## 2020-03-01 LAB — C-REACTIVE PROTEIN: CRP: 1.5 mg/dL — ABNORMAL HIGH (ref <1.0)

## 2020-03-01 LAB — FERRITIN: Ferritin: 21 ng/mL — ABNORMAL LOW (ref 24–336)

## 2020-03-01 MED ORDER — IOHEXOL 300 MG/ML  SOLN
100.0000 mL | Freq: Once | INTRAMUSCULAR | Status: AC | PRN
  Administered 2020-03-02: 100 mL via INTRAVENOUS

## 2020-03-01 MED ORDER — IOHEXOL 9 MG/ML PO SOLN
ORAL | Status: AC
  Filled 2020-03-01: qty 1000

## 2020-03-01 NOTE — Progress Notes (Signed)
Walked into patient's room and found patient had taken out NGT tube. Was able to get NGT back in x2 attempts with charge nurse Elease Hashimoto. Pt tolerated fairly. Restraints removed and ROM performed, restraints applied back Skin on BIL wrist WDL. Will continue to monitor.

## 2020-03-01 NOTE — Progress Notes (Signed)
[facility] Progress Note     Subjective: Seen this Am and having less abdomen pain and distention. Had Bms yesterday and today. NG is aggravating him and he tries to pull it out. Not much in the canister, had RN remove today.   Xray with continued dilated loops. Discussed option of CT with oral contrast and delayed imaging with radiology to see if we can further assess for obstruction, and discussed sigmoid area which we do not think is really involved.   Objective: Vital signs in last 24 hours: Temp:  [97.1 F (36.2 C)-98 F (36.7 C)] 97.8 F (36.6 C) (11/16 1444) Pulse Rate:  [77-94] 77 (11/16 1444) Resp:  [17-18] 18 (11/16 1444) BP: (114-125)/(57-67) 114/57 (11/16 1444) SpO2:  [95 %-99 %] 99 % (11/16 1444) Last BM Date: 03/01/20  Intake/Output from previous day: 11/15 0701 - 11/16 0700 In: 1040 [I.V.:1000; NG/GT:40] Out: 2800 [Urine:1600; Emesis/NG output:1200] Intake/Output this shift: Total I/O In: -  Out: 400 [Emesis/NG output:400]  General appearance: alert and no distress GI: chronic distention, tympany, but no tenderness or guarding  Lab Results:  Recent Labs    02/29/20 0521 03/01/20 0834  WBC 9.3 8.1  HGB 12.3* 10.8*  HCT 40.8 37.0*  PLT 232 189   BMET Recent Labs    02/28/20 1432 02/29/20 0521  NA 135 139  K 4.4 4.2  CL 97* 98  CO2 26 26  GLUCOSE 168* 165*  BUN 27* 30*  CREATININE 1.11 0.94  CALCIUM 10.5* 9.3   PT/INR No results for input(s): LABPROT, INR in the last 72 hours.  Studies/Results: DG Abd 1 View  Result Date: 03/01/2020 CLINICAL DATA:  Small-bowel obstruction.  Abdominal pain. EXAM: ABDOMEN - 1 VIEW COMPARISON:  11/11/2019 FINDINGS: Two supine views. Again demonstrated are gas-filled loops of small bowel, similar. Normal caliber colon. No free intraperitoneal air given limitations of supine exam. Radiation seeds in the prostate. IMPRESSION: Similar appearance of diffuse gaseous distension of small bowel loops  which is likely related to distal small bowel obstruction, given CT appearance. Electronically Signed   By: Jeronimo Greaves M.D.   On: 03/01/2020 14:07   DG Chest Portable 1 View  Result Date: 02/28/2020 CLINICAL DATA:  69 year old male with NG placement. EXAM: PORTABLE CHEST 1 VIEW COMPARISON:  Radiograph dated 07/05/2019. FINDINGS: An enteric tube with side-port in the distal esophagus and tip just distal to the GE junction. Recommend further advancing by additional 10 cm. Bibasilar atelectasis. No focal consolidation, pleural effusion or pneumothorax. The cardiac silhouette is within limits. Atherosclerotic calcification of the aorta. No acute osseous pathology. Degenerative changes of the spine. Distended stomach and loops of bowel in the visualized upper abdomen. Clinical correlation is recommended. IMPRESSION: 1. Enteric tube with side-port in the distal esophagus and tip just distal to the GE junction. Recommend further advancing by additional 10 cm. 2. Bibasilar atelectasis. Electronically Signed   By: Elgie Collard M.D.   On: 02/28/2020 18:39    Anti-infectives: Anti-infectives (From admission, onward)   None      Assessment/Plan: [name] is a 69 yo well known to me with SBO. He had some Bms with suppositories yesterday and without. He has less distention on exam and NG output is minimal.  CT with oral contrast and delays ordered Continue NPO, no aggressive surgery measures indicated or recommended given overall condition, COVID positive etc  Discussed with Dr. Laural Benes, Levi Carmel NP and RN.    LOS: 2 days    [name]  [name] 03/01/2020

## 2020-03-01 NOTE — Progress Notes (Signed)
Pt completed first bottle of contrast.

## 2020-03-01 NOTE — Plan of Care (Signed)
  Problem: Nutrition Goal: Patient maintains adequate hydration Outcome: Progressing Goal: Patient maintains weight Outcome: Progressing Goal: Patient/Family demonstrates understanding of diet Outcome: Progressing Goal: Patient/Family independently completes tube feeding Outcome: Progressing Goal: Patient will have no more than 5 lb weight change during LOS Outcome: Progressing Goal: Patient will utilize adaptive techniques to administer nutrition Outcome: Progressing Goal: Patient will verbalize dietary restrictions Outcome: Progressing   Problem: Education: Goal: Knowledge of General Education information will improve Description: Including pain rating scale, medication(s)/side effects and non-pharmacologic comfort measures Outcome: Progressing   Problem: Health Behavior/Discharge Planning: Goal: Ability to manage health-related needs will improve Outcome: Progressing   Problem: Clinical Measurements: Goal: Ability to maintain clinical measurements within normal limits will improve Outcome: Progressing Goal: Will remain free from infection Outcome: Progressing Goal: Diagnostic test results will improve Outcome: Progressing Goal: Respiratory complications will improve Outcome: Progressing Goal: Cardiovascular complication will be avoided Outcome: Progressing   Problem: Activity: Goal: Risk for activity intolerance will decrease Outcome: Progressing   Problem: Nutrition: Goal: Adequate nutrition will be maintained Outcome: Progressing   Problem: Coping: Goal: Level of anxiety will decrease Outcome: Progressing   Problem: Elimination: Goal: Will not experience complications related to bowel motility Outcome: Progressing Goal: Will not experience complications related to urinary retention Outcome: Progressing   Problem: Pain Managment: Goal: General experience of comfort will improve Outcome: Progressing   Problem: Safety: Goal: Ability to remain free from  injury will improve Outcome: Progressing   Problem: Skin Integrity: Goal: Risk for impaired skin integrity will decrease Outcome: Progressing   

## 2020-03-01 NOTE — Plan of Care (Signed)
  Problem: Nutrition Goal: Patient maintains adequate hydration Outcome: Not Progressing Goal: Patient maintains weight Outcome: Not Progressing Goal: Patient/Family demonstrates understanding of diet Outcome: Not Progressing Goal: Patient/Family independently completes tube feeding Outcome: Not Progressing Goal: Patient will have no more than 5 lb weight change during LOS Outcome: Not Progressing Goal: Patient will utilize adaptive techniques to administer nutrition Outcome: Not Progressing Goal: Patient will verbalize dietary restrictions Outcome: Not Progressing   Problem: Education: Goal: Knowledge of General Education information will improve Description: Including pain rating scale, medication(s)/side effects and non-pharmacologic comfort measures Outcome: Not Progressing   Problem: Health Behavior/Discharge Planning: Goal: Ability to manage health-related needs will improve Outcome: Not Progressing   Problem: Clinical Measurements: Goal: Ability to maintain clinical measurements within normal limits will improve Outcome: Not Progressing Goal: Will remain free from infection Outcome: Not Progressing Goal: Diagnostic test results will improve Outcome: Not Progressing Goal: Respiratory complications will improve Outcome: Not Progressing Goal: Cardiovascular complication will be avoided Outcome: Not Progressing   Problem: Activity: Goal: Risk for activity intolerance will decrease Outcome: Not Progressing   Problem: Nutrition: Goal: Adequate nutrition will be maintained Outcome: Not Progressing   Problem: Coping: Goal: Level of anxiety will decrease Outcome: Not Progressing   Problem: Elimination: Goal: Will not experience complications related to bowel motility Outcome: Not Progressing Goal: Will not experience complications related to urinary retention Outcome: Not Progressing   Problem: Pain Managment: Goal: General experience of comfort will  improve Outcome: Not Progressing   Problem: Safety: Goal: Ability to remain free from injury will improve Outcome: Not Progressing   Problem: Skin Integrity: Goal: Risk for impaired skin integrity will decrease Outcome: Not Progressing

## 2020-03-01 NOTE — Progress Notes (Signed)
Pt pulled out NGT for the third time today, IV is also out. Pt has BIL soft writs restraints on. MD notified along with palliative and surgeon. Await on orders. Will continue to monitor.

## 2020-03-01 NOTE — Plan of Care (Signed)
  Problem: Nutrition Goal: Patient maintains adequate hydration 03/01/2020 2218 by Kerrie Buffalo, RN Outcome: Not Progressing 03/01/2020 2218 by Kerrie Buffalo, RN Outcome: Progressing Goal: Patient maintains weight 03/01/2020 2218 by Kerrie Buffalo, RN Outcome: Not Progressing 03/01/2020 2218 by Kerrie Buffalo, RN Outcome: Progressing Goal: Patient/Family demonstrates understanding of diet 03/01/2020 2218 by Kerrie Buffalo, RN Outcome: Not Progressing 03/01/2020 2218 by Kerrie Buffalo, RN Outcome: Progressing Goal: Patient/Family independently completes tube feeding 03/01/2020 2218 by Kerrie Buffalo, RN Outcome: Not Progressing 03/01/2020 2218 by Kerrie Buffalo, RN Outcome: Progressing Goal: Patient will have no more than 5 lb weight change during LOS 03/01/2020 2218 by Kerrie Buffalo, RN Outcome: Not Progressing 03/01/2020 2218 by Kerrie Buffalo, RN Outcome: Progressing Goal: Patient will utilize adaptive techniques to administer nutrition 03/01/2020 2218 by Kerrie Buffalo, RN Outcome: Not Progressing 03/01/2020 2218 by Kerrie Buffalo, RN Outcome: Progressing Goal: Patient will verbalize dietary restrictions 03/01/2020 2218 by Kerrie Buffalo, RN Outcome: Not Progressing 03/01/2020 2218 by Kerrie Buffalo, RN Outcome: Progressing   Problem: Education: Goal: Knowledge of General Education information will improve Description: Including pain rating scale, medication(s)/side effects and non-pharmacologic comfort measures 03/01/2020 2218 by Kerrie Buffalo, RN Outcome: Not Progressing 03/01/2020 2218 by Kerrie Buffalo, RN Outcome: Progressing   Problem: Health Behavior/Discharge Planning: Goal: Ability to manage health-related needs will improve 03/01/2020 2218 by Kerrie Buffalo, RN Outcome: Not Progressing 03/01/2020 2218 by Kerrie Buffalo, RN Outcome: Progressing   Problem: Clinical Measurements: Goal: Ability to maintain clinical measurements within normal limits  will improve 03/01/2020 2218 by Kerrie Buffalo, RN Outcome: Not Progressing 03/01/2020 2218 by Kerrie Buffalo, RN Outcome: Progressing Goal: Will remain free from infection 03/01/2020 2218 by Kerrie Buffalo, RN Outcome: Not Progressing 03/01/2020 2218 by Kerrie Buffalo, RN Outcome: Progressing Goal: Diagnostic test results will improve 03/01/2020 2218 by Kerrie Buffalo, RN Outcome: Not Progressing 03/01/2020 2218 by Kerrie Buffalo, RN Outcome: Progressing Goal: Respiratory complications will improve 03/01/2020 2218 by Kerrie Buffalo, RN Outcome: Not Progressing 03/01/2020 2218 by Kerrie Buffalo, RN Outcome: Progressing Goal: Cardiovascular complication will be avoided 03/01/2020 2218 by Kerrie Buffalo, RN Outcome: Not Progressing 03/01/2020 2218 by Kerrie Buffalo, RN Outcome: Progressing   Problem: Activity: Goal: Risk for activity intolerance will decrease 03/01/2020 2218 by Kerrie Buffalo, RN Outcome: Not Progressing 03/01/2020 2218 by Kerrie Buffalo, RN Outcome: Progressing   Problem: Nutrition: Goal: Adequate nutrition will be maintained 03/01/2020 2218 by Kerrie Buffalo, RN Outcome: Not Progressing 03/01/2020 2218 by Kerrie Buffalo, RN Outcome: Progressing   Problem: Coping: Goal: Level of anxiety will decrease 03/01/2020 2218 by Kerrie Buffalo, RN Outcome: Not Progressing 03/01/2020 2218 by Kerrie Buffalo, RN Outcome: Progressing   Problem: Elimination: Goal: Will not experience complications related to bowel motility 03/01/2020 2218 by Kerrie Buffalo, RN Outcome: Not Progressing 03/01/2020 2218 by Kerrie Buffalo, RN Outcome: Progressing Goal: Will not experience complications related to urinary retention 03/01/2020 2218 by Kerrie Buffalo, RN Outcome: Not Progressing 03/01/2020 2218 by Kerrie Buffalo, RN Outcome: Progressing   Problem: Pain Managment: Goal: General experience of comfort will improve 03/01/2020 2218 by Kerrie Buffalo, RN Outcome: Not  Progressing 03/01/2020 2218 by Kerrie Buffalo, RN Outcome: Progressing   Problem: Safety: Goal: Ability to remain free from injury will improve 03/01/2020 2218 by Kerrie Buffalo, RN Outcome: Not Progressing 03/01/2020 2218 by Kerrie Buffalo, RN Outcome: Progressing   Problem: Skin Integrity: Goal: Risk for impaired skin integrity will decrease 03/01/2020 2218 by Kerrie Buffalo, RN Outcome: Not Progressing 03/01/2020 2218 by Kerrie Buffalo, RN Outcome: Progressing

## 2020-03-01 NOTE — Progress Notes (Signed)
New orders to DC NGT and continue to monitor.

## 2020-03-01 NOTE — Progress Notes (Signed)
PROGRESS NOTE   [name]  JOI:[id number] DOB: 1950-05-02 DOA: 02/28/2020 PCP: Pearson Grippe, MD   Chief Complaint  Patient presents with  . Abdominal Pain   Brief Admission History:  69 y.o. male with medical history significant for intermittent disability, hypertension, BPH, bilateral hydronephrosis, and extensive abdominal history including gangrenous cholecystitis, pneumo intestinalis, ileus, pneumoperitoneum.  History is obtained from chart review and patient's aunt.  Patient is nonverbal at baseline.  Patient was brought to the ED with complaints of abdominal swelling of 3 days duration.  Patient lives with his aunt [name], ED provider was able to talk to her on the phone.  Patient's last bowel movement was 3 days ago.  She gave patient a suppository but this did not help.  Patient had also had ongoing back pain for several weeks.  He was admitted with abdominal distention and small bowel obstruction.    Assessment & Plan:   Principal Problem:   SBO (small bowel obstruction) (HCC) Active Problems:   Intellectual disability   HTN (hypertension)   Diabetes mellitus type 2, noninsulin dependent (HCC)   BPH (benign prostatic hyperplasia)   Nonverbal   COVID-19 virus infection  1. SBO - Pt having a difficult time tolerating NG tube and has removed it several times.  Xray abdomen today shows persistent gaseous distension and distal obstruction.  He is being hydrated with NS. IV zofran as needed. He remains NPO.  With his history of multiple abdominal surgery and family's desire for no further surgery I have asked for palliative consult for goals of care as I doubt he would be able to tolerate further abdominal surgery given his frail condition.  2. Covid infection - he is asymptomatic. He has been fully vaccinated with Moderna.  3. Essential hypertension - stable BPs, following.  4. BPH - he has been maintained on tamsulosin.  5. Type 2 DM - continue SSI coverage and CBG  monitoring. 6. Abnormal UA - f/u urine culture likely a contaminated specimen.   DVT prophylaxis:  SQ heparin  Code Status:  DNR  Family Communication: t/c to aunt Disposition: TBD   Status is: Inpatient  Remains inpatient appropriate because:IV treatments appropriate due to intensity of illness or inability to take PO and Inpatient level of care appropriate due to severity of illness  Dispo: The patient is from: Home              Anticipated d/c is to: TBD              Anticipated d/c date is: 3 days              Patient currently is not medically stable to d/c. Consultants:   GS  Palliative   Procedures:   NG placement   Antimicrobials:    Subjective: Pt nonverbal at baseline. Unfortunately he is not tolerating the NG and has removed it several times even when in soft restraints.   Objective: Vitals:   02/29/20 0900 02/29/20 1510 02/29/20 2030 03/01/20 0433  BP: 117/66 124/65 125/61 117/67  Pulse:  93 94 88  Resp: 16 16 17 17   Temp: (!) 97.4 F (36.3 C) 98.5 F (36.9 C) 98 F (36.7 C) (!) 97.1 F (36.2 C)  TempSrc: Oral Axillary Axillary   SpO2: 95% 93% 95% 95%  Weight:      Height:        Intake/Output Summary (Last 24 hours) at 03/01/2020 1421 Last data filed at 03/01/2020 1100 Gross per 24 hour  Intake 1040 ml  Output 2500 ml  Net -1460 ml   Filed Weights   02/28/20 1344  Weight: 59 kg   Examination:  General exam: Appears calm and comfortable. NG in place with bilious output seen.  Respiratory system:  No wheezes or rales heard today.  Respiratory effort normal. Cardiovascular system: normal S1 & S2 heard.  Gastrointestinal system: Abdomen is deformed appearing with multiple scars, markedly distended, soft. No organomegaly or masses felt. Hyperactive bowel sounds heard. Central nervous system: awake.   Extremities: Symmetric 5 x 5 power. Skin: No grossly apparent rashes, lesions or ulcers Psychiatry: unable to assess due to baseline condition.     Data Reviewed: I have personally reviewed following labs and imaging studies  CBC: Recent Labs  Lab 02/28/20 1432 02/29/20 0521 03/01/20 0834  WBC 8.1 9.3 8.1  NEUTROABS 6.6 7.9* 6.6  HGB 13.7 12.3* 10.8*  HCT 45.8 40.8 37.0*  MCV 87.2 86.1 88.7  PLT 232 232 189    Basic Metabolic Panel: Recent Labs  Lab 02/28/20 1432 02/29/20 0521  NA 135 139  K 4.4 4.2  CL 97* 98  CO2 26 26  GLUCOSE 168* 165*  BUN 27* 30*  CREATININE 1.11 0.94  CALCIUM 10.5* 9.3    GFR: Estimated Creatinine Clearance: 61 mL/min (by C-G formula based on SCr of 0.94 mg/dL).  Liver Function Tests: Recent Labs  Lab 02/28/20 1432  AST 22  ALT 17  ALKPHOS 84  BILITOT 0.6  PROT 9.4*  ALBUMIN 4.5    CBG: Recent Labs  Lab 02/29/20 0638 02/29/20 1143 02/29/20 1813 02/29/20 2033 03/01/20 0639  GLUCAP 171* 116* 98 109* 171*    Recent Results (from the past 240 hour(s))  Respiratory Panel by RT PCR (Flu A&B, Covid) - Nasopharyngeal Swab     Status: Abnormal   Collection Time: 02/28/20  5:06 PM   Specimen: Nasopharyngeal Swab  Result Value Ref Range Status   SARS Coronavirus 2 by RT PCR POSITIVE (A) NEGATIVE Final    Comment: CRITICAL RESULT CALLED TO, READ BACK BY AND VERIFIED WITH: R GENTRY AT 1857 ON 02/28/2020 BY MOSLEY,J (NOTE) SARS-CoV-2 target nucleic acids are DETECTED.  SARS-CoV-2 RNA is generally detectable in upper respiratory specimens  during the acute phase of infection. Positive results are indicative of the presence of the identified virus, but do not rule out bacterial infection or co-infection with other pathogens not detected by the test. Clinical correlation with patient history and other diagnostic information is necessary to determine patient infection status. The expected result is Negative.  Fact Sheet for Patients:  https://www.moore.com/https://www.fda.gov/media/142436/download  Fact Sheet for Healthcare Providers: https://www.young.biz/https://www.fda.gov/media/142435/download  This test is  not yet approved or cleared by the [place] FDA and  has been authorized for detection and/or diagnosis of SARS-CoV-2 by FDA under an Emergency Use Authorization (EUA).  This EUA will remain in effect (meaning  this test can be used) for the duration of  the COVID-19 declaration under Section 564(b)(1) of the Act, 21 U.S.C. section 360bbb-3(b)(1), unless the authorization is terminated or revoked sooner.      Influenza A by PCR NEGATIVE NEGATIVE Final   Influenza B by PCR NEGATIVE NEGATIVE Final    Comment: (NOTE) The Xpert Xpress SARS-CoV-2/FLU/RSV assay is intended as an aid in  the diagnosis of influenza from Nasopharyngeal swab specimens and  should not be used as a sole basis for treatment. Nasal washings and  aspirates are unacceptable for Xpert Xpress SARS-CoV-2/FLU/RSV  testing.  Fact  Sheet for Patients: https://www.moore.com/  Fact Sheet for Healthcare Providers: https://www.young.biz/  This test is not yet approved or cleared by the [place] FDA and  has been authorized for detection and/or diagnosis of SARS-CoV-2 by  FDA under an Emergency Use Authorization (EUA). This EUA will remain  in effect (meaning this test can be used) for the duration of the  Covid-19 declaration under Section 564(b)(1) of the Act, 21  U.S.C. section 360bbb-3(b)(1), unless the authorization is  terminated or revoked. Performed at [facility], [address] [place], [place] [address]      Radiology Studies: DG Abd 1 View  Result Date: 03/01/2020 CLINICAL DATA:  Small-bowel obstruction.  Abdominal pain. EXAM: ABDOMEN - 1 VIEW COMPARISON:  11/11/2019 FINDINGS: Two supine views. Again demonstrated are gas-filled loops of small bowel, similar. Normal caliber colon. No free intraperitoneal air given limitations of supine exam. Radiation seeds in the prostate. IMPRESSION: Similar appearance of diffuse gaseous distension of small bowel loops which  is likely related to distal small bowel obstruction, given CT appearance. Electronically Signed   By: Jeronimo Greaves M.D.   On: 03/01/2020 14:07   CT Abdomen Pelvis W Contrast  Result Date: 02/28/2020 CLINICAL DATA:  69 year old male with abdominal distension. EXAM: CT ABDOMEN AND PELVIS WITH CONTRAST TECHNIQUE: Multidetector CT imaging of the abdomen and pelvis was performed using the standard protocol following bolus administration of intravenous contrast. CONTRAST:  OMNIPAQUE IOHEXOL 300 MG/ML  SOLN COMPARISON:  CT abdomen pelvis dated 07/06/2019. FINDINGS: Lower chest: There are bibasilar atelectasis. No intra-abdominal free air or free fluid. Hepatobiliary: The liver is unremarkable. No intrahepatic biliary dilatation. Cholecystectomy. Pancreas: Unremarkable. No pancreatic ductal dilatation or surrounding inflammatory changes. Spleen: Normal in size without focal abnormality. Adrenals/Urinary Tract: The adrenal glands unremarkable. There is a 12 mm nonobstructing left renal inferior pole calculus. Additional smaller nonobstructing stones noted in the inferior pole of the left kidney. There is a 3 mm nonobstructing calculus in the left renal pelvis. There is mild left hydronephrosis. The right kidney is unremarkable. The visualized ureters appear unremarkable. There is diffuse thickened and trabeculated appearance of the bladder wall which may be related to chronic bladder outlet obstruction or chronic bladder dysfunction. Correlation with urinalysis recommended to exclude UTI Stomach/Bowel: Moderate to severe gastric distension. There is ingested content in the distal esophagus, likely reflux. Multiple dilated and fluid-filled loops of small bowel with air-fluid levels noted throughout the abdomen measuring up to 9.5 cm in the right upper quadrant. There is an area of twisting in the mid abdomen to the right of the midline which involves a short segment of the sigmoid colon as well as the terminal  ileum which may represent an internal hernia or volvulus. There is associated obstruction of the small bowel at the level of the distal/terminal ileum (coronal 43/5 and axial 49/2) there is moderate stool throughout the colon. No pneumatosis or evidence of bowel ischemia at this time. Vascular/Lymphatic: The abdominal aorta and IVC unremarkable. No portal venous gas. There is no adenopathy. Reproductive: Prostate fiducial markers. Other: None Musculoskeletal: Osteopenia with degenerative changes of the spine. No acute osseous pathology. IMPRESSION: 1. Small-bowel obstruction with transition zone at the distal/terminal ileum secondary to volvulus or internal hernia. Surgical consult is advised. No pneumatosis or evidence of bowel ischemia at this time. 2. Multiple nonobstructing left renal calculi. Mild left hydronephrosis. 3. Thickened and trabeculated appearance of the bladder wall may be related to chronic bladder outlet obstruction or chronic bladder dysfunction. Correlation  with urinalysis recommended to exclude UTI. Electronically Signed   By: Elgie Collard M.D.   On: 02/28/2020 16:57   DG Chest Portable 1 View  Result Date: 02/28/2020 CLINICAL DATA:  69 year old male with NG placement. EXAM: PORTABLE CHEST 1 VIEW COMPARISON:  Radiograph dated 07/05/2019. FINDINGS: An enteric tube with side-port in the distal esophagus and tip just distal to the GE junction. Recommend further advancing by additional 10 cm. Bibasilar atelectasis. No focal consolidation, pleural effusion or pneumothorax. The cardiac silhouette is within limits. Atherosclerotic calcification of the aorta. No acute osseous pathology. Degenerative changes of the spine. Distended stomach and loops of bowel in the visualized upper abdomen. Clinical correlation is recommended. IMPRESSION: 1. Enteric tube with side-port in the distal esophagus and tip just distal to the GE junction. Recommend further advancing by additional 10 cm. 2. Bibasilar  atelectasis. Electronically Signed   By: Elgie Collard M.D.   On: 02/28/2020 18:39   Scheduled Meds: . bisacodyl  10 mg Rectal BID  . Chlorhexidine Gluconate Cloth  6 each Topical Daily  . dexamethasone (DECADRON) injection  6 mg Intravenous Q24H  . heparin  5,000 Units Subcutaneous Q8H  . insulin aspart  0-9 Units Subcutaneous Q8H  . simethicone  80 mg Per Tube QID   Continuous Infusions: . sodium chloride 100 mL/hr at 03/01/20 1335    LOS: 2 days   Time spent: 33 mins   Rhian Asebedo Laural Benes, MD How to contact the [facility] Attending or Consulting provider 7A - 7P or covering provider during after hours 7P -7A, for this patient?  1. Check the care team in [facility] and look for a) attending/consulting [facility] provider listed and b) the [facility] team listed 2. Log into [email or web address] and use [place]'s universal password to access. If you do not have the password, please contact the hospital operator. 3. Locate the [facility] provider you are looking for under [facility] and page to a number that you can be directly reached. 4. If you still have difficulty reaching the provider, please page the [facility] (Director on Call) for the Hospitalists listed on amion for assistance.  03/01/2020, 2:21 PM

## 2020-03-01 NOTE — Consult Note (Signed)
Consultation Note Date: 03/01/2020   Patient Name: [name]  DOB: Aug 31, 1950  MRN: [id number]  Age / Sex: 69 y.o., male  PCP: Pearson Grippe, MD Referring Physician: Cleora Fleet, MD  Reason for Consultation: Establishing goals of care and Psychosocial/spiritual support  HPI/Patient Profile: 69 y.o. male  with past medical history of intelectual disability, HTN, BPH, bilateral hydronephrosis, and extensive abdominal history including gangrenous cholecystitis, pneumo intestinalis, ileus, pneumoperitoneum. admitted on 02/28/2020 with SBO.   Clinical Assessment and Goals of Care: [name] is noted to be Covid positive, but asymptomatic.  He is in a negative pressure room.  He will briefly make, but not keep eye contact.  He does not respond verbally, but is able to make his basic needs known.  There is no family at bedside at this time due to Covid positive status.  [name] abdomen is soft, he does not grimace as I palpate.  Conference with attending and surgery consult related to patient condition, needs, goals of care.  Call to aunt/legal guardian, Otelia Sergeant.  No answer, unable to leave VM message at only number listed.   PMT to continue to reach out.   Call to aunt/legal guardian [name].  She shares that they call him Axton.  We talk about xray and suggestion for barium CT.  [name] tells me that Chawn has been doing good with her, eating well.  We talked about his chronic illness burden with all of his gut issues.   At home Hospice discussed. We talk about "treat the treatable" care.  Building relationship with hospice provider so that they know how best to care for [facility] when he gets sick again.  Conference with attending, bedside nursing staff, surgeon, transition of care team related to patient condition, needs, goals of care.  HCPOA   LEGAL GUARDIAN - aunt/legal guardian, [name]  Long.     SUMMARY OF RECOMMENDATIONS   Treat the treatable but no CPR or intubation Per family, declines further surgery Agreeable to barium CT for bowel motility Considering at home "treat the treatable" hospice care upon discharge   Code Status/Advance Care Planning:  DNR  Symptom Management:   Per hospitalist, no additional needs at this time.  Palliative Prophylaxis:   Frequent Pain Assessment and Turn Reposition  Additional Recommendations (Limitations, Scope, Preferences):  Treat the treatable but no CPR or intubation, no surgery per family  Psycho-social/Spiritual:   Desire for further Chaplaincy support:no  Additional Recommendations: Caregiving  Support/Resources and Education on Hospice  Prognosis:   Unable to determine, based on outcomes.  6 months or less would not be surprising based on poor functional skin status, frailty, chronic illness burden in particular related to GI issues.  Would qualify for at home, "treat the treatable" hospice care.  Discharge Planning: To be determined, anticipate home with home health versus home with "treat the treatable" hospice.      Primary Diagnoses: Present on Admission: . SBO (small bowel obstruction) (HCC) . HTN (hypertension) . Nonverbal . BPH (benign prostatic hyperplasia) .  COVID-19 virus infection   I have reviewed the medical record, interviewed the patient and family, and examined the patient. The following aspects are pertinent.  Past Medical History:  Diagnosis Date  . Diabetes mellitus without complication (HCC)   . History of gout   . History of kidney stones   . Hypertension   . Mentally challenged   . Urinary retention    Social History   Socioeconomic History  . Marital status: Single    Spouse name: Not on file  . Number of children: Not on file  . Years of education: Not on file  . Highest education level: Not on file  Occupational History  . Not on file  Tobacco Use  . Smoking  status: Never Smoker  . Smokeless tobacco: Never Used  Vaping Use  . Vaping Use: Never used  Substance and Sexual Activity  . Alcohol use: No  . Drug use: No  . Sexual activity: Never    Birth control/protection: None  Other Topics Concern  . Not on file  Social History Narrative   Patient is delayed lives with family   Social Determinants of Health   Financial Resource Strain:   . Difficulty of Paying Living Expenses: Not on file  Food Insecurity:   . Worried About Programme researcher, broadcasting/film/videounning Out of Food in the Last Year: Not on file  . Ran Out of Food in the Last Year: Not on file  Transportation Needs:   . Lack of Transportation (Medical): Not on file  . Lack of Transportation (Non-Medical): Not on file  Physical Activity:   . Days of Exercise per Week: Not on file  . Minutes of Exercise per Session: Not on file  Stress:   . Feeling of Stress : Not on file  Social Connections:   . Frequency of Communication with Friends and Family: Not on file  . Frequency of Social Gatherings with Friends and Family: Not on file  . Attends Religious Services: Not on file  . Active Member of Clubs or Organizations: Not on file  . Attends BankerClub or Organization Meetings: Not on file  . Marital Status: Not on file   Family History  Problem Relation Age of Onset  . Gallbladder disease Other    Scheduled Meds: . bisacodyl  10 mg Rectal BID  . Chlorhexidine Gluconate Cloth  6 each Topical Daily  . dexamethasone (DECADRON) injection  6 mg Intravenous Q24H  . heparin  5,000 Units Subcutaneous Q8H  . insulin aspart  0-9 Units Subcutaneous Q8H  . simethicone  80 mg Per Tube QID   Continuous Infusions: . sodium chloride 100 mL/hr at 03/01/20 0433   PRN Meds:.HYDROmorphone (DILAUDID) injection, ondansetron **OR** ondansetron (ZOFRAN) IV Medications Prior to Admission:  Prior to Admission medications   Medication Sig Start Date End Date Taking? Authorizing Provider  acetaminophen (TYLENOL) 500 MG tablet Take  1,000 mg by mouth every 6 (six) hours as needed for mild pain or headache.   Yes [provider]  aspirin EC 81 MG tablet Take 1 tablet (81 mg total) by mouth daily with breakfast. 05/29/19  Yes Emokpae, Courage, MD  atorvastatin (LIPITOR) 20 MG tablet Take 20 mg by mouth at bedtime. 06/09/19  Yes [provider]  bisacodyl (DULCOLAX) 10 MG suppository Place 1 suppository (10 mg total) rectally daily as needed for moderate constipation. 07/08/19  Yes Emokpae, Courage, MD  finasteride (PROSCAR) 5 MG tablet Take 1 tablet (5 mg total) by mouth daily. 05/29/19  Yes Emokpae,  Courage, MD  magnesium oxide (MAG-OX) 400 MG tablet Take 1 tablet (400 mg total) by mouth daily. 10/23/18  Yes Sharee Holster, NP  metFORMIN (GLUCOPHAGE) 850 MG tablet Take 1 tablet (850 mg total) by mouth 2 (two) times daily with a meal. Patient taking differently: Take 1,000 mg by mouth 2 (two) times daily with a meal.  05/29/19 05/28/20 Yes Shon Hale, MD  Potassium Chloride ER 20 MEQ TBCR Take 20 mEq by mouth every Tuesday, Thursday, Saturday, and Sunday. 1 tab daily by mouth 05/30/19  Yes Emokpae, Courage, MD  Simethicone (GAS-X PO) Take by mouth. Chewable - after each meal.   Yes [provider]  tamsulosin (FLOMAX) 0.4 MG CAPS capsule Take 1 capsule (0.4 mg total) by mouth every evening. 05/29/19  Yes Shon Hale, MD  linaclotide (LINZESS) 145 MCG CAPS capsule Take 1 capsule (145 mcg total) by mouth daily before breakfast. Patient not taking: Reported on 02/29/2020 05/29/19   Shon Hale, MD  senna-docusate (SENOKOT-S) 8.6-50 MG tablet Take 2 tablets by mouth at bedtime. Patient not taking: Reported on 02/29/2020 05/29/19   Shon Hale, MD   No Known Allergies Review of Systems  Unable to perform ROS: Other    Physical Exam Vitals and nursing note reviewed.  Constitutional:      General: He is not in acute distress.    Appearance: He is normal weight. He is ill-appearing.  HENT:       Head:     Comments: Some temporal wasting  Cardiovascular:     Rate and Rhythm: Normal rate.  Pulmonary:     Effort: Pulmonary effort is normal. No respiratory distress.  Abdominal:     General: Abdomen is protuberant.     Palpations: Abdomen is soft.  Skin:    General: Skin is warm and dry.  Neurological:     Mental Status: He is alert.  Psychiatric:     Comments: Calm and cooperative      Vital Signs: BP 117/67 (BP Location: Right Arm)   Pulse 88   Temp (!) 97.1 F (36.2 C)   Resp 17   Ht 5\' 6"  (1.676 m)   Wt 59 kg   SpO2 95%   BMI 20.99 kg/m  Pain Scale: Faces   Pain Score: 0-No pain   SpO2: SpO2: 95 % O2 Device:SpO2: 95 % O2 Flow Rate: .   IO: Intake/output summary:   Intake/Output Summary (Last 24 hours) at 03/01/2020 0919 Last data filed at 03/01/2020 0433 Gross per 24 hour  Intake 1040 ml  Output 2450 ml  Net -1410 ml    LBM: Last BM Date: 02/29/20 Baseline Weight: Weight: 59 kg Most recent weight: Weight: 59 kg     Palliative Assessment/Data:   Flowsheet Rows     Most Recent Value  Intake Tab  Referral Department Hospitalist  Unit at Time of Referral Med/Surg Unit  Palliative Care Primary Diagnosis Other (Comment)  Date Notified 02/29/20  Palliative Care Type Return patient Palliative Care  Reason for referral Clarify Goals of Care  Date of Admission 02/28/20  Date first seen by Palliative Care 03/01/20  # of days Palliative referral response time 1 Day(s)  # of days IP prior to Palliative referral 1  Clinical Assessment  Palliative Performance Scale Score 20%  Pain Max last 24 hours Not able to report  Pain Min Last 24 hours Not able to report  Dyspnea Max Last 24 Hours Not able to report  Dyspnea Min Last 24  hours Not able to report  Psychosocial & Spiritual Assessment  Palliative Care Outcomes      Time In: 1040 Time Out: 1150 Time Total: 70 minutes  Greater than 50%  of this time was spent counseling and coordinating  care related to the above assessment and plan.  Signed by: Katheran Awe, NP   Please contact Palliative Medicine Team phone at [phone] for questions and concerns.  For individual provider: See Loretha Stapler

## 2020-03-02 DIAGNOSIS — Z515 Encounter for palliative care: Secondary | ICD-10-CM | POA: Diagnosis not present

## 2020-03-02 DIAGNOSIS — K56609 Unspecified intestinal obstruction, unspecified as to partial versus complete obstruction: Secondary | ICD-10-CM | POA: Diagnosis not present

## 2020-03-02 DIAGNOSIS — R140 Abdominal distension (gaseous): Secondary | ICD-10-CM | POA: Diagnosis not present

## 2020-03-02 DIAGNOSIS — E119 Type 2 diabetes mellitus without complications: Secondary | ICD-10-CM

## 2020-03-02 DIAGNOSIS — F79 Unspecified intellectual disabilities: Secondary | ICD-10-CM

## 2020-03-02 DIAGNOSIS — K562 Volvulus: Secondary | ICD-10-CM | POA: Diagnosis not present

## 2020-03-02 DIAGNOSIS — U071 COVID-19: Secondary | ICD-10-CM

## 2020-03-02 DIAGNOSIS — Z7189 Other specified counseling: Secondary | ICD-10-CM | POA: Diagnosis not present

## 2020-03-02 DIAGNOSIS — I10 Essential (primary) hypertension: Secondary | ICD-10-CM

## 2020-03-02 LAB — URINALYSIS, COMPLETE (UACMP) WITH MICROSCOPIC
Bilirubin Urine: NEGATIVE
Glucose, UA: NEGATIVE mg/dL
Ketones, ur: NEGATIVE mg/dL
Nitrite: POSITIVE — AB
Protein, ur: 30 mg/dL — AB
Specific Gravity, Urine: 1.01 (ref 1.005–1.030)
WBC, UA: >50 WBC/hpf — ABNORMAL HIGH (ref 0–5)
pH: 7 (ref 5.0–8.0)

## 2020-03-02 LAB — CBC WITH DIFFERENTIAL/PLATELET
Abs Immature Granulocytes: 0.02 10*3/uL (ref 0.00–0.07)
Basophils Absolute: 0 10*3/uL (ref 0.0–0.1)
Basophils Relative: 0 %
Eosinophils Absolute: 0 10*3/uL (ref 0.0–0.5)
Eosinophils Relative: 1 %
HCT: 36.1 % — ABNORMAL LOW (ref 39.0–52.0)
Hemoglobin: 10.4 g/dL — ABNORMAL LOW (ref 13.0–17.0)
Immature Granulocytes: 0 %
Lymphocytes Relative: 24 %
Lymphs Abs: 1.3 10*3/uL (ref 0.7–4.0)
MCH: 25.4 pg — ABNORMAL LOW (ref 26.0–34.0)
MCHC: 28.8 g/dL — ABNORMAL LOW (ref 30.0–36.0)
MCV: 88.3 fL (ref 80.0–100.0)
Monocytes Absolute: 0.5 10*3/uL (ref 0.1–1.0)
Monocytes Relative: 10 %
Neutro Abs: 3.6 10*3/uL (ref 1.7–7.7)
Neutrophils Relative %: 65 %
Platelets: 181 10*3/uL (ref 150–400)
RBC: 4.09 MIL/uL — ABNORMAL LOW (ref 4.22–5.81)
RDW: 17.2 % — ABNORMAL HIGH (ref 11.5–15.5)
WBC: 5.5 10*3/uL (ref 4.0–10.5)
nRBC: 0 % (ref 0.0–0.2)

## 2020-03-02 LAB — GLUCOSE, CAPILLARY
Glucose-Capillary: 100 mg/dL — ABNORMAL HIGH (ref 70–99)
Glucose-Capillary: 160 mg/dL — ABNORMAL HIGH (ref 70–99)
Glucose-Capillary: 157 mg/dL — ABNORMAL HIGH (ref 70–99)

## 2020-03-02 LAB — C-REACTIVE PROTEIN: CRP: 0.7 mg/dL (ref <1.0)

## 2020-03-02 LAB — FERRITIN: Ferritin: 21 ng/mL — ABNORMAL LOW (ref 24–336)

## 2020-03-02 LAB — D-DIMER, QUANTITATIVE: D-Dimer, Quant: 1.19 ug/mL-FEU — ABNORMAL HIGH (ref 0.00–0.50)

## 2020-03-02 MED ORDER — SIMETHICONE 40 MG/0.6ML PO SUSP
80.0000 mg | Freq: Four times a day (QID) | ORAL | Status: DC
  Administered 2020-03-02 – 2020-03-04 (×8): 80 mg via ORAL

## 2020-03-02 NOTE — Progress Notes (Signed)
[facility] Progress Note     Subjective: Tolerated clears this AM. Distended but this is normal for him after eating.   Objective: Vital signs in last 24 hours: Temp:  [97.5 F (36.4 C)-97.8 F (36.6 C)] 97.8 F (36.6 C) (11/17 0611) Pulse Rate:  [70-77] 70 (11/17 0611) Resp:  [17-18] 18 (11/17 0611) BP: (114-124)/(57-70) 124/65 (11/17 0611) SpO2:  [97 %-99 %] 98 % (11/17 0611) Last BM Date: 03/01/20  Intake/Output from previous day: 11/16 0701 - 11/17 0700 In: -  Out: 1150 [Urine:750; Emesis/NG output:400] Intake/Output this shift: Total I/O In: 2061 [P.O.:1071; I.V.:990] Out: -   General appearance: alert and no distress Resp: normal work of breathing GI: soft, distended, nontender  Lab Results:  Recent Labs    03/01/20 0834 03/02/20 0712  WBC 8.1 5.5  HGB 10.8* 10.4*  HCT 37.0* 36.1*  PLT 189 181   BMET Recent Labs    02/28/20 1432 02/29/20 0521  NA 135 139  K 4.4 4.2  CL 97* 98  CO2 26 26  GLUCOSE 168* 165*  BUN 27* 30*  CREATININE 1.11 0.94  CALCIUM 10.5* 9.3   PT/INR No results for input(s): LABPROT, INR in the last 72 hours.  Studies/Results: DG Abd 1 View  Result Date: 03/01/2020 CLINICAL DATA:  Small-bowel obstruction.  Abdominal pain. EXAM: ABDOMEN - 1 VIEW COMPARISON:  11/11/2019 FINDINGS: Two supine views. Again demonstrated are gas-filled loops of small bowel, similar. Normal caliber colon. No free intraperitoneal air given limitations of supine exam. Radiation seeds in the prostate. IMPRESSION: Similar appearance of diffuse gaseous distension of small bowel loops which is likely related to distal small bowel obstruction, given CT appearance. Electronically Signed   By: Jeronimo Greaves M.D.   On: 03/01/2020 14:07   CT ABDOMEN PELVIS W CONTRAST  Result Date: 03/02/2020 CLINICAL DATA:  Bowel obstruction EXAM: CT ABDOMEN AND PELVIS WITH CONTRAST TECHNIQUE: Multidetector CT imaging of the abdomen and pelvis was performed  using the standard protocol following bolus administration of intravenous contrast. CONTRAST:  OMNIPAQUE IOHEXOL 300 MG/ML  SOLN COMPARISON:  02/28/2020 FINDINGS: Lower chest: Trace bilateral pleural effusions are present with associated mild bibasilar compressive atelectasis. The visualized heart and pericardium are unremarkable. The distal esophagus is circumferentially thick walled, a finding that can reflect esophagitis, as can be seen with reflux esophagitis. This is nonspecific Hepatobiliary: Surgical changes of left hepatectomy and cholecystectomy are identified. Residual right hepatic lobe is unremarkable. Pancreas: Unremarkable Spleen: Unremarkable Adrenals/Urinary Tract: Adrenal glands are unremarkable. Kidneys are normal in size and position. Multiple nonobstructing calculi are seen within the left kidney measuring up to 16 mm within the lower pole. An additional 4 mm nonobstructing calculus is seen within the left renal pelvis. No nephro or urolithiasis on the right. No hydronephrosis. The bladder is circumferentially markedly thick walled and demonstrates mild perivesicular inflammatory stranding suggesting changes of diffuse infectious or inflammatory cystitis. A Foley catheter balloon is now in place within the bladder lumen which is decompressed. Stomach/Bowel: The previously noted small bowel obstruction likely related to an internal hernia depicted on the coronal images of the prior examination has resolved. The small bowel is of normal caliber. Contrast has passed into the ascending colon. Additionally, there is moderate gaseous distension of the colon, however, this has also decreased significantly since prior examination. No free intraperitoneal gas or fluid. There is circumferential rectal wall thickening suggesting changes of mild proctitis, new since prior examination. Vascular/Lymphatic: Mild aortoiliac atherosclerotic calcification. No aortic aneurysm.  No pathologic adenopathy within  the abdomen and pelvis. Reproductive: Brachytherapy seeds are seen within the prostate gland. Other: Mild subcutaneous body wall edema is present within the dependent body wall in keeping with mild anasarca. Musculoskeletal: No no acute bone abnormality. IMPRESSION: Resolved small-bowel obstruction. Improved colonic ileus with decreasing gaseous distension of the colon. Interval development of mild rectal wall thickening possibly related to changes of mild proctitis. Circumferential wall thickening of the distal esophagus, possibly related to changes of esophagitis such as reflux esophagitis. This could be better assessed with endoscopy if indicated. Nonobstructing left nephrolithiasis and urolithiasis. No hydronephrosis. Marked circumferential wall thickening and perivesicular inflammatory stranding involving the bladder suggesting a diffuse underlying infectious or inflammatory cystitis, similar to that noted on prior examination. Interval Foley catheter placement and decompression of the bladder. Aortic Atherosclerosis (ICD10-I70.0). Electronically Signed   By: Helyn Numbers MD   On: 03/02/2020 00:50    Anti-infectives: Anti-infectives (From admission, onward)   None      Assessment/Plan: [name] 69 yo with SBO that is resolved on repeat CT.  Diet as tolerated Continue simethicone and suppositories   Talked with [name].    LOS: 3 days    [name] 03/02/2020

## 2020-03-02 NOTE — Plan of Care (Signed)
  Problem: Nutrition Goal: Patient maintains adequate hydration Outcome: Progressing Goal: Patient maintains weight Outcome: Progressing Goal: Patient/Family demonstrates understanding of diet Outcome: Progressing Goal: Patient/Family independently completes tube feeding Outcome: Progressing Goal: Patient will have no more than 5 lb weight change during LOS Outcome: Progressing Goal: Patient will utilize adaptive techniques to administer nutrition Outcome: Progressing Goal: Patient will verbalize dietary restrictions Outcome: Progressing   Problem: Education: Goal: Knowledge of General Education information will improve Description: Including pain rating scale, medication(s)/side effects and non-pharmacologic comfort measures Outcome: Progressing   Problem: Health Behavior/Discharge Planning: Goal: Ability to manage health-related needs will improve Outcome: Progressing   Problem: Clinical Measurements: Goal: Ability to maintain clinical measurements within normal limits will improve Outcome: Progressing Goal: Will remain free from infection Outcome: Progressing Goal: Diagnostic test results will improve Outcome: Progressing Goal: Respiratory complications will improve Outcome: Progressing Goal: Cardiovascular complication will be avoided Outcome: Progressing   Problem: Activity: Goal: Risk for activity intolerance will decrease Outcome: Progressing   Problem: Nutrition: Goal: Adequate nutrition will be maintained Outcome: Progressing   Problem: Coping: Goal: Level of anxiety will decrease Outcome: Progressing   Problem: Elimination: Goal: Will not experience complications related to bowel motility Outcome: Progressing Goal: Will not experience complications related to urinary retention Outcome: Progressing   Problem: Pain Managment: Goal: General experience of comfort will improve Outcome: Progressing   Problem: Safety: Goal: Ability to remain free from  injury will improve Outcome: Progressing   Problem: Skin Integrity: Goal: Risk for impaired skin integrity will decrease Outcome: Progressing   

## 2020-03-02 NOTE — Progress Notes (Signed)
[facility]  CT done last night and obstruction resolved with contrast in the colon. Will give clears and adv as tolerated. Bms recorded last night.   Algis Greenhouse, MD [facility] [address] Vella Raring [place], [place] [address] [phone] (office)

## 2020-03-02 NOTE — Progress Notes (Signed)
Palliative: Chart reviewed.  [name] remains stable, has moved his bowels, is taking clear liquids by mouth.   Call to legal guardian, [name].  We talked about [name]'s acute and chronic health concerns.  I share that last night CT showed that his obstruction had resolved and he would be given clear liquids and advance diet as tolerated.  I share that due to his chronic GI issues, it is likely that [name] will experience obstruction again.  Levi Casa asks about feeding [name], sharing that he usually eats soft foods.  I shared that this is a good choice, nothing to dry.  We talked about disposition.  Levi Casa shares that she does not think that short-term rehab would be a good choice for [name] even if he qualifies.  She shares her preference that [name] return home with advanced home care home health service.  We talked about the benefits of at home hospice care for "treat the treatable" care and support services.  Levi Casa is agreeable to hospice referral, to start after home health.  [facility], would likely benefit from starting with outpatient palliative.  Levi Casa asks about [name]'s Covid positive status.  She shares that she feels that he has been sick for a few weeks.  She states that she is agreeable for him to return home Covid positive, stating that she can quarantine him in his room.  She does asked that we retest for Covid if possible.  Conference with attending, bedside nursing staff, transition of care team related to patient condition, needs, goals of care.  Plan: Continue to treat the treatable but no CPR, no intubation, no surgery.  At this point goal is to return home with home health services if qualified, advanced provider of choice.  Agreeable to outpatient palliative services, transition to hospice care when appropriate.      35 minutes Levi Carmel, NP Palliative Medicine Team Team Phone # [phone] Greater than 50% of this time was spent counseling  and coordinating care related to the above assessment and plan.

## 2020-03-02 NOTE — Progress Notes (Addendum)
PROGRESS NOTE  [name] BTD:[id number] DOB: 1950-12-11 DOA: 02/28/2020 PCP: Pearson Grippe, MD  Brief History:  69 y.o. male with medical history significant for cognitive disability, hypertension, BPH, bilateral hydronephrosis, and extensive abdominal history including gangrenous cholecystitis, pneumo intestinalis, ileus, pneumoperitoneum. History is obtained from chart review and patient's aunt.  Patient is poorly communicative at baseline. Patient was brought to the ED with complaints of abdominal swelling of 3 days duration.  Patient lives with his aunt [name], ED provider was able to talk to her on the phone.  Patient's last bowel movement was 3 days ago.  She gave patient a suppository but this did not help.  Patient had also had ongoing back pain for several weeks.    Admitted back in March 2021 with intra-abdominal free air of unknown etiology, had persistent pneumoperitoneum without definitive source of perforation.  Managed conservatively.  Prior to that 05/2019 had open laparotomy for pneumatosis intestinalis and cholecystitis/cholelithiasis.  patient's aunt [name]-patient's niece [name] at [phone].  She confirms history of abdominal distention, and reports that today patient was walking up a ramp with his walker when he kind of slumped but he did not fall because her daughter and patient's niece held onto patient and just gently laid him down to the floor to sit down.  He did not hit his head.  She denies any Covid related symptoms inpatient.  Patient is fully vaccinated for Covid, received 2 doses of Moderna back in March.  No known history of sick contacts.  She is also emphasizes that she does not want patient to have any more abdominal surgeries.  ED Course: Stable vitals.  Electrolytes within normal limits, unremarkable CBC.  Abdominal CT showed small bowel obstruction with transition zone at the distal/terminal ileum secondary to volvulus or internal hernia.   Multiple nonobstructing left renal calculi, mild left hydronephrosis, trabeculated bladder-chronic bladder outlet obstruction of chronic bladder dysfunction. EDP talked to general surgeon Dr. Lovell Sheehan recommended admission, NG tube placement, patient will be seen in the morning.   Assessment/Plan: Small bowel obstruction -02/28/2020 CT abdomen--SBO with transition zone distal and transverse small bowel secondary to volvulus; no pneumatosis; thickened and trabeculated bladder wall likely secondary to chronic BR and bladder dysfunction -General surgery consulted -Managed with NG decompression and nonoperative management -03/01/2020 evening--patient had bowel movement -11/17 CT abd--resolved SBO, interval development of mild rectal wall thickening and distal esophagus -03/02/2020--diet advanced -Needs daily bowel regimen  Diabetes mellitus type 2, controlled -Continue NovoLog sliding scale -05/23/2019 hemoglobin A1c 7.4 -Holding Metformin  COVID-19 infection -Patient has been asymptomatic without hypoxia or tachypnea -Patient is fully vaccinated -this was incidental finding -discussed risks/benefits/alternatives of Regen-CoV with POA--she expressed understanding and wants patient to receive this  BPH/Urinary retention -Holding tamsulosin until the patient is able to tolerate p.o.>>restart -Holding finasteride into the patient is able to tolerate p.o.>>restart -patient has to be catheterized at least bid at home -d/c foley and start intermittent caths  Pyuria -11/17 UA >50WBC -empiric ceftriaxone pending culture data  Hyperlipidemia -Restart atorvastatin once tolerating p.o.  Goals of care -Palliative medicine was consulted -DNR confirmed -Plan is to go home with home health care and possible hospice consult in the near future      Status is: Inpatient  Remains inpatient appropriate because:IV treatments appropriate due to intensity of illness or inability to take  PO   Dispo: The patient is from: Home  Anticipated d/c is to: Home              Anticipated d/c date is: 1 day              Patient currently is not medically stable to d/c.        Family Communication: Aunt updated  Consultants:  General surgery, palliative medicine  Code Status:  DNR  DVT Prophylaxis:  [place] Heparin    Procedures: As Listed in Progress Note Above  Antibiotics: None       Subjective: Unobtainable due to patient's cognitive impairment. Complains of some abd pain but states it is improving.  Objective: Vitals:   03/01/20 1444 03/01/20 2108 03/02/20 0611 03/02/20 1350  BP: (!) 114/57 119/70 124/65 (!) 116/56  Pulse: 77 75 70 (!) 57  Resp: 18 17 18 18   Temp: 97.8 F (36.6 C) (!) 97.5 F (36.4 C) 97.8 F (36.6 C) 97.6 F (36.4 C)  TempSrc: Oral   Oral  SpO2: 99% 97% 98% 97%  Weight:      Height:        Intake/Output Summary (Last 24 hours) at 03/02/2020 1805 Last data filed at 03/02/2020 1731 Gross per 24 hour  Intake 2416 ml  Output 750 ml  Net 1666 ml   Weight change:  Exam:   General:  Pt is alert, does not follow commands appropriately, not in acute distress  HEENT: No icterus, No thrush, No neck mass, [place]/AT  Cardiovascular: RRR, S1/S2, no rubs, no gallops  Respiratory: bibasilar rales. No wheeze  Abdomen: Soft/+BS, non tender, non distended, no guarding  Extremities: No edema, No lymphangitis, No petechiae, No rashes, no synovitis   Data Reviewed: I have personally reviewed following labs and imaging studies Basic Metabolic Panel: Recent Labs  Lab 02/28/20 1432 02/29/20 0521  NA 135 139  K 4.4 4.2  CL 97* 98  CO2 26 26  GLUCOSE 168* 165*  BUN 27* 30*  CREATININE 1.11 0.94  CALCIUM 10.5* 9.3   Liver Function Tests: Recent Labs  Lab 02/28/20 1432  AST 22  ALT 17  ALKPHOS 84  BILITOT 0.6  PROT 9.4*  ALBUMIN 4.5   No results for input(s): LIPASE, AMYLASE in the last 168 hours. No results  for input(s): AMMONIA in the last 168 hours. Coagulation Profile: No results for input(s): INR, PROTIME in the last 168 hours. CBC: Recent Labs  Lab 02/28/20 1432 02/29/20 0521 03/01/20 0834 03/02/20 0712  WBC 8.1 9.3 8.1 5.5  NEUTROABS 6.6 7.9* 6.6 3.6  HGB 13.7 12.3* 10.8* 10.4*  HCT 45.8 40.8 37.0* 36.1*  MCV 87.2 86.1 88.7 88.3  PLT 232 232 189 181   Cardiac Enzymes: No results for input(s): CKTOTAL, CKMB, CKMBINDEX, TROPONINI in the last 168 hours. BNP: Invalid input(s): POCBNP CBG: Recent Labs  Lab 03/01/20 0639 03/01/20 1439 03/01/20 2105 03/02/20 0607 03/02/20 1352  GLUCAP 171* 124* 108* 100* 160*   HbA1C: No results for input(s): HGBA1C in the last 72 hours. Urine analysis:    Component Value Date/Time   COLORURINE AMBER (A) 02/28/2020 2215   APPEARANCEUR CLOUDY (A) 02/28/2020 2215   LABSPEC >1.046 (H) 02/28/2020 2215   PHURINE 7.0 02/28/2020 2215   GLUCOSEU NEGATIVE 02/28/2020 2215   HGBUR SMALL (A) 02/28/2020 2215   BILIRUBINUR NEGATIVE 02/28/2020 2215   KETONESUR 20 (A) 02/28/2020 2215   PROTEINUR 100 (A) 02/28/2020 2215   UROBILINOGEN 1.0 07/20/2014 1718   NITRITE NEGATIVE 02/28/2020 2215   LEUKOCYTESUR NEGATIVE 02/28/2020 2215  Sepsis Labs: (procalcitonin:4,lacticidven:4) ) Recent Results (from the past 240 hour(s))  Respiratory Panel by RT PCR (Flu A&B, Covid) - Nasopharyngeal Swab     Status: Abnormal   Collection Time: 02/28/20  5:06 PM   Specimen: Nasopharyngeal Swab  Result Value Ref Range Status   SARS Coronavirus 2 by RT PCR POSITIVE (A) NEGATIVE Final    Comment: CRITICAL RESULT CALLED TO, READ BACK BY AND VERIFIED WITH: R GENTRY AT 1857 ON 02/28/2020 BY MOSLEY,J (NOTE) SARS-CoV-2 target nucleic acids are DETECTED.  SARS-CoV-2 RNA is generally detectable in upper respiratory specimens  during the acute phase of infection. Positive results are indicative of the presence of the identified virus, but do not rule  out bacterial infection or co-infection with other pathogens not detected by the test. Clinical correlation with patient history and other diagnostic information is necessary to determine patient infection status. The expected result is Negative.  Fact Sheet for Patients:  https://www.moore.com/  Fact Sheet for Healthcare Providers: https://www.young.biz/  This test is not yet approved or cleared by the [place] FDA and  has been authorized for detection and/or diagnosis of SARS-CoV-2 by FDA under an Emergency Use Authorization (EUA).  This EUA will remain in effect (meaning  this test can be used) for the duration of  the COVID-19 declaration under Section 564(b)(1) of the Act, 21 U.S.C. section 360bbb-3(b)(1), unless the authorization is terminated or revoked sooner.      Influenza A by PCR NEGATIVE NEGATIVE Final   Influenza B by PCR NEGATIVE NEGATIVE Final    Comment: (NOTE) The Xpert Xpress SARS-CoV-2/FLU/RSV assay is intended as an aid in  the diagnosis of influenza from Nasopharyngeal swab specimens and  should not be used as a sole basis for treatment. Nasal washings and  aspirates are unacceptable for Xpert Xpress SARS-CoV-2/FLU/RSV  testing.  Fact Sheet for Patients: https://www.moore.com/  Fact Sheet for Healthcare Providers: https://www.young.biz/  This test is not yet approved or cleared by the [place] FDA and  has been authorized for detection and/or diagnosis of SARS-CoV-2 by  FDA under an Emergency Use Authorization (EUA). This EUA will remain  in effect (meaning this test can be used) for the duration of the  Covid-19 declaration under Section 564(b)(1) of the Act, 21  U.S.C. section 360bbb-3(b)(1), unless the authorization is  terminated or revoked. Performed at [facility], [address] [place], [place] [address]      Scheduled Meds: . bisacodyl  10 mg Rectal  BID  . Chlorhexidine Gluconate Cloth  6 each Topical Daily  . heparin  5,000 Units Subcutaneous Q8H  . insulin aspart  0-9 Units Subcutaneous Q8H  . simethicone  80 mg Oral QID   Continuous Infusions: . sodium chloride 100 mL/hr at 03/02/20 1050    Procedures/Studies: DG Abd 1 View  Result Date: 03/01/2020 CLINICAL DATA:  Small-bowel obstruction.  Abdominal pain. EXAM: ABDOMEN - 1 VIEW COMPARISON:  11/11/2019 FINDINGS: Two supine views. Again demonstrated are gas-filled loops of small bowel, similar. Normal caliber colon. No free intraperitoneal air given limitations of supine exam. Radiation seeds in the prostate. IMPRESSION: Similar appearance of diffuse gaseous distension of small bowel loops which is likely related to distal small bowel obstruction, given CT appearance. Electronically Signed   By: Jeronimo Greaves M.D.   On: 03/01/2020 14:07   CT ABDOMEN PELVIS W CONTRAST  Result Date: 03/02/2020 CLINICAL DATA:  Bowel obstruction EXAM: CT ABDOMEN AND PELVIS WITH CONTRAST TECHNIQUE: Multidetector CT imaging of the abdomen and  pelvis was performed using the standard protocol following bolus administration of intravenous contrast. CONTRAST:  100mL OMNIPAQUE IOHEXOL 300 MG/ML  SOLN COMPARISON:  02/28/2020 FINDINGS: Lower chest: Trace bilateral pleural effusions are present with associated mild bibasilar compressive atelectasis. The visualized heart and pericardium are unremarkable. The distal esophagus is circumferentially thick walled, a finding that can reflect esophagitis, as can be seen with reflux esophagitis. This is nonspecific Hepatobiliary: Surgical changes of left hepatectomy and cholecystectomy are identified. Residual right hepatic lobe is unremarkable. Pancreas: Unremarkable Spleen: Unremarkable Adrenals/Urinary Tract: Adrenal glands are unremarkable. Kidneys are normal in size and position. Multiple nonobstructing calculi are seen within the left kidney measuring up to 16 mm within the  lower pole. An additional 4 mm nonobstructing calculus is seen within the left renal pelvis. No nephro or urolithiasis on the right. No hydronephrosis. The bladder is circumferentially markedly thick walled and demonstrates mild perivesicular inflammatory stranding suggesting changes of diffuse infectious or inflammatory cystitis. A Foley catheter balloon is now in place within the bladder lumen which is decompressed. Stomach/Bowel: The previously noted small bowel obstruction likely related to an internal hernia depicted on the coronal images of the prior examination has resolved. The small bowel is of normal caliber. Contrast has passed into the ascending colon. Additionally, there is moderate gaseous distension of the colon, however, this has also decreased significantly since prior examination. No free intraperitoneal gas or fluid. There is circumferential rectal wall thickening suggesting changes of mild proctitis, new since prior examination. Vascular/Lymphatic: Mild aortoiliac atherosclerotic calcification. No aortic aneurysm. No pathologic adenopathy within the abdomen and pelvis. Reproductive: Brachytherapy seeds are seen within the prostate gland. Other: Mild subcutaneous body wall edema is present within the dependent body wall in keeping with mild anasarca. Musculoskeletal: No no acute bone abnormality. IMPRESSION: Resolved small-bowel obstruction. Improved colonic ileus with decreasing gaseous distension of the colon. Interval development of mild rectal wall thickening possibly related to changes of mild proctitis. Circumferential wall thickening of the distal esophagus, possibly related to changes of esophagitis such as reflux esophagitis. This could be better assessed with endoscopy if indicated. Nonobstructing left nephrolithiasis and urolithiasis. No hydronephrosis. Marked circumferential wall thickening and perivesicular inflammatory stranding involving the bladder suggesting a diffuse underlying  infectious or inflammatory cystitis, similar to that noted on prior examination. Interval Foley catheter placement and decompression of the bladder. Aortic Atherosclerosis (ICD10-I70.0). Electronically Signed   By: Helyn NumbersAshesh  Parikh MD   On: 03/02/2020 00:50   CT Abdomen Pelvis W Contrast  Result Date: 02/28/2020 CLINICAL DATA:  69 year old male with abdominal distension. EXAM: CT ABDOMEN AND PELVIS WITH CONTRAST TECHNIQUE: Multidetector CT imaging of the abdomen and pelvis was performed using the standard protocol following bolus administration of intravenous contrast. CONTRAST:  100mL OMNIPAQUE IOHEXOL 300 MG/ML  SOLN COMPARISON:  CT abdomen pelvis dated 07/06/2019. FINDINGS: Lower chest: There are bibasilar atelectasis. No intra-abdominal free air or free fluid. Hepatobiliary: The liver is unremarkable. No intrahepatic biliary dilatation. Cholecystectomy. Pancreas: Unremarkable. No pancreatic ductal dilatation or surrounding inflammatory changes. Spleen: Normal in size without focal abnormality. Adrenals/Urinary Tract: The adrenal glands unremarkable. There is a 12 mm nonobstructing left renal inferior pole calculus. Additional smaller nonobstructing stones noted in the inferior pole of the left kidney. There is a 3 mm nonobstructing calculus in the left renal pelvis. There is mild left hydronephrosis. The right kidney is unremarkable. The visualized ureters appear unremarkable. There is diffuse thickened and trabeculated appearance of the bladder wall which may be related to chronic bladder outlet  obstruction or chronic bladder dysfunction. Correlation with urinalysis recommended to exclude UTI Stomach/Bowel: Moderate to severe gastric distension. There is ingested content in the distal esophagus, likely reflux. Multiple dilated and fluid-filled loops of small bowel with air-fluid levels noted throughout the abdomen measuring up to 9.5 cm in the right upper quadrant. There is an area of twisting in the mid  abdomen to the right of the midline which involves a short segment of the sigmoid colon as well as the terminal ileum which may represent an internal hernia or volvulus. There is associated obstruction of the small bowel at the level of the distal/terminal ileum (coronal 43/5 and axial 49/2) there is moderate stool throughout the colon. No pneumatosis or evidence of bowel ischemia at this time. Vascular/Lymphatic: The abdominal aorta and IVC unremarkable. No portal venous gas. There is no adenopathy. Reproductive: Prostate fiducial markers. Other: None Musculoskeletal: Osteopenia with degenerative changes of the spine. No acute osseous pathology. IMPRESSION: 1. Small-bowel obstruction with transition zone at the distal/terminal ileum secondary to volvulus or internal hernia. Surgical consult is advised. No pneumatosis or evidence of bowel ischemia at this time. 2. Multiple nonobstructing left renal calculi. Mild left hydronephrosis. 3. Thickened and trabeculated appearance of the bladder wall may be related to chronic bladder outlet obstruction or chronic bladder dysfunction. Correlation with urinalysis recommended to exclude UTI. Electronically Signed   By: Elgie Collard M.D.   On: 02/28/2020 16:57   DG Chest Portable 1 View  Result Date: 02/28/2020 CLINICAL DATA:  69 year old male with NG placement. EXAM: PORTABLE CHEST 1 VIEW COMPARISON:  Radiograph dated 07/05/2019. FINDINGS: An enteric tube with side-port in the distal esophagus and tip just distal to the GE junction. Recommend further advancing by additional 10 cm. Bibasilar atelectasis. No focal consolidation, pleural effusion or pneumothorax. The cardiac silhouette is within limits. Atherosclerotic calcification of the aorta. No acute osseous pathology. Degenerative changes of the spine. Distended stomach and loops of bowel in the visualized upper abdomen. Clinical correlation is recommended. IMPRESSION: 1. Enteric tube with side-port in the distal  esophagus and tip just distal to the GE junction. Recommend further advancing by additional 10 cm. 2. Bibasilar atelectasis. Electronically Signed   By: Elgie Collard M.D.   On: 02/28/2020 18:39    Catarina Hartshorn, DO  [facility]  If 7PM-7AM, please contact night-coverage [email or web address] Password [email or web address] 03/02/2020, 6:05 PM   LOS: 3 days

## 2020-03-03 DIAGNOSIS — K562 Volvulus: Secondary | ICD-10-CM | POA: Diagnosis not present

## 2020-03-03 DIAGNOSIS — R140 Abdominal distension (gaseous): Secondary | ICD-10-CM | POA: Diagnosis not present

## 2020-03-03 DIAGNOSIS — K56609 Unspecified intestinal obstruction, unspecified as to partial versus complete obstruction: Secondary | ICD-10-CM | POA: Diagnosis not present

## 2020-03-03 DIAGNOSIS — I10 Essential (primary) hypertension: Secondary | ICD-10-CM | POA: Diagnosis not present

## 2020-03-03 DIAGNOSIS — U071 COVID-19: Secondary | ICD-10-CM | POA: Diagnosis not present

## 2020-03-03 DIAGNOSIS — Z7189 Other specified counseling: Secondary | ICD-10-CM | POA: Diagnosis not present

## 2020-03-03 LAB — GLUCOSE, CAPILLARY
Glucose-Capillary: 115 mg/dL — ABNORMAL HIGH (ref 70–99)
Glucose-Capillary: 159 mg/dL — ABNORMAL HIGH (ref 70–99)
Glucose-Capillary: 153 mg/dL — ABNORMAL HIGH (ref 70–99)

## 2020-03-03 LAB — CBC WITH DIFFERENTIAL/PLATELET
Abs Immature Granulocytes: 0.01 10*3/uL (ref 0.00–0.07)
Basophils Absolute: 0 10*3/uL (ref 0.0–0.1)
Basophils Relative: 0 %
Eosinophils Absolute: 0.1 10*3/uL (ref 0.0–0.5)
Eosinophils Relative: 2 %
HCT: 39.2 % (ref 39.0–52.0)
Hemoglobin: 11.5 g/dL — ABNORMAL LOW (ref 13.0–17.0)
Immature Granulocytes: 0 %
Lymphocytes Relative: 25 %
Lymphs Abs: 1.3 10*3/uL (ref 0.7–4.0)
MCH: 26.1 pg (ref 26.0–34.0)
MCHC: 29.3 g/dL — ABNORMAL LOW (ref 30.0–36.0)
MCV: 89.1 fL (ref 80.0–100.0)
Monocytes Absolute: 0.5 10*3/uL (ref 0.1–1.0)
Monocytes Relative: 9 %
Neutro Abs: 3.4 10*3/uL (ref 1.7–7.7)
Neutrophils Relative %: 64 %
Platelets: 185 10*3/uL (ref 150–400)
RBC: 4.4 MIL/uL (ref 4.22–5.81)
RDW: 16.7 % — ABNORMAL HIGH (ref 11.5–15.5)
WBC: 5.3 10*3/uL (ref 4.0–10.5)
nRBC: 0 % (ref 0.0–0.2)

## 2020-03-03 LAB — BASIC METABOLIC PANEL
Anion gap: 8 (ref 5–15)
BUN: 8 mg/dL (ref 8–23)
CO2: 25 mmol/L (ref 22–32)
Calcium: 8.5 mg/dL — ABNORMAL LOW (ref 8.9–10.3)
Chloride: 104 mmol/L (ref 98–111)
Creatinine, Ser: 0.67 mg/dL (ref 0.61–1.24)
GFR, Estimated: >60 mL/min (ref >60)
Glucose, Bld: 117 mg/dL — ABNORMAL HIGH (ref 70–99)
Potassium: 3.5 mmol/L (ref 3.5–5.1)
Sodium: 137 mmol/L (ref 135–145)

## 2020-03-03 LAB — D-DIMER, QUANTITATIVE: D-Dimer, Quant: 0.98 ug/mL-FEU — ABNORMAL HIGH (ref 0.00–0.50)

## 2020-03-03 LAB — C-REACTIVE PROTEIN: CRP: <0.5 mg/dL (ref <1.0)

## 2020-03-03 LAB — MAGNESIUM: Magnesium: 1.7 mg/dL (ref 1.7–2.4)

## 2020-03-03 LAB — FERRITIN: Ferritin: 27 ng/mL (ref 24–336)

## 2020-03-03 LAB — PHOSPHORUS: Phosphorus: 2.6 mg/dL (ref 2.5–4.6)

## 2020-03-03 MED ORDER — BISACODYL 10 MG RE SUPP: 10.0000 mg | Freq: Every day | RECTAL | 1 refills | Status: AC

## 2020-03-03 MED ORDER — SODIUM CHLORIDE 0.9 % IV SOLN
1.0000 g | INTRAVENOUS | Status: DC
  Administered 2020-03-03: 1 g via INTRAVENOUS
  Filled 2020-03-03: qty 10

## 2020-03-03 MED ORDER — FAMOTIDINE IN NACL 20-0.9 MG/50ML-% IV SOLN: 20.0000 mg | Freq: Once | INTRAVENOUS | Status: DC | PRN

## 2020-03-03 MED ORDER — TAMSULOSIN HCL 0.4 MG PO CAPS
0.4000 mg | ORAL_CAPSULE | Freq: Every day | ORAL | Status: DC
  Administered 2020-03-03: 0.4 mg via ORAL
  Filled 2020-03-03: qty 1

## 2020-03-03 MED ORDER — MAGNESIUM SULFATE 2 GM/50ML IV SOLN
2.0000 g | Freq: Once | INTRAVENOUS | Status: AC
  Administered 2020-03-03: 2 g via INTRAVENOUS
  Filled 2020-03-03: qty 50

## 2020-03-03 MED ORDER — ALBUTEROL SULFATE HFA 108 (90 BASE) MCG/ACT IN AERS: 2.0000 | INHALATION_SPRAY | Freq: Once | RESPIRATORY_TRACT | Status: DC | PRN

## 2020-03-03 MED ORDER — FINASTERIDE 5 MG PO TABS
5.0000 mg | ORAL_TABLET | Freq: Every day | ORAL | Status: DC
  Administered 2020-03-03 – 2020-03-04 (×2): 5 mg via ORAL
  Filled 2020-03-03 (×2): qty 1

## 2020-03-03 MED ORDER — EPINEPHRINE 0.3 MG/0.3ML IJ SOAJ
0.3000 mg | Freq: Once | INTRAMUSCULAR | Status: DC | PRN
  Filled 2020-03-03: qty 0.6

## 2020-03-03 MED ORDER — SODIUM CHLORIDE 0.9 % IV SOLN: INTRAVENOUS | Status: DC | PRN

## 2020-03-03 MED ORDER — SIMETHICONE 40 MG/0.6ML PO SUSP
80.0000 mg | Freq: Four times a day (QID) | ORAL | 1 refills | Status: DC
Start: 2020-03-03 — End: 2020-04-05

## 2020-03-03 MED ORDER — DIPHENHYDRAMINE HCL 50 MG/ML IJ SOLN: 50.0000 mg | Freq: Once | INTRAMUSCULAR | Status: DC | PRN

## 2020-03-03 MED ORDER — METHYLPREDNISOLONE SODIUM SUCC 125 MG IJ SOLR: 125.0000 mg | Freq: Once | INTRAMUSCULAR | Status: DC | PRN

## 2020-03-03 MED ORDER — SODIUM CHLORIDE 0.9 % IV SOLN
1200.0000 mg | Freq: Once | INTRAVENOUS | Status: AC
  Administered 2020-03-03: 1200 mg via INTRAVENOUS
  Filled 2020-03-03: qty 10

## 2020-03-03 NOTE — Discharge Summary (Signed)
Physician Discharge Summary  Levi AMBROCIO ZOX:[id number] DOB: 01-12-1951 DOA: 02/28/2020  PCP: Pearson Grippe, MD  Admit date: 02/28/2020 Discharge date: 03/04/20  Admitted From: Home Disposition:  Home   Recommendations for Outpatient Follow-up:  1. Follow up with PCP in 1-2 weeks 2. Please obtain BMP/CBC in one week   Home Health:yes Equipment/Devices:HHPT  Discharge Condition: Stable CODE STATUS: FULL Diet recommendation:soft   Brief/Interim Summary: 69 y.o.malewith medical history significant forcognitive disability, hypertension, BPH, bilateral hydronephrosis, and extensive abdominal history including gangrenous cholecystitis, pneumo intestinalis,ileus,pneumoperitoneum. History is obtained from chart reviewand patient's aunt. Patient is poorly communicative at baseline. Patient was brought to the ED with complaints of abdominal swelling of 3 days duration. Patient lives with his aunt [name],ED provider was able to talk to her on the phone. Patient's last bowel movement was 3 days ago. She gave patient a suppository but this did not help. Patient had also had ongoing back pain for several weeks.   Admitted back in March 2021 with intra-abdominal free air of unknown etiology,had persistent pneumoperitoneum without definitive source of perforation. Managed conservatively.Prior to that 2/2021had open laparotomyforpneumatosis intestinalis and cholecystitis/cholelithiasis.  patient's aunt [name]'s niece [name] [phone]. She confirms history of abdominal distention,and reports that today patient was walking up a ramp with his walker when he kind of slumped but he did not fall because her daughter and patient's niece held onto patient and just gently laid him down to the floor to sit down. He did not hit his head. She denies any Covid related symptoms inpatient. Patient is fully vaccinated for Covid, received 2 doses of Modernaback in March.  No known history of sick contacts. She is also emphasizes that she doesnot want patient to have any more abdominal surgeries.  ED Course:Stable vitals. Electrolytes within normal limits,unremarkable CBC. Abdominal CT showed small bowel obstruction with transition zone at the distal/terminal ileum secondary to volvulus or internal hernia. Multiple nonobstructing left renal calculi,mild left hydronephrosis, trabeculated bladder-chronic bladder outlet obstruction of chronic bladder dysfunction. EDPtalked to general surgeon Dr. Lovell Sheehan recommended admission, NG tube placement, patient will be seen in the morning.  Discharge Diagnoses:  Small bowel obstruction -02/28/2020 CT abdomen--SBO with transition zone distal and transverse small bowel secondary to volvulus; no pneumatosis; thickened and trabeculated bladder wall likely secondary to chronic BR and bladder dysfunction -General surgery consulted -Managed with NG decompression and nonoperative management -03/01/2020 evening--patient had bowel movement -11/17 CT abd--resolved SBO, interval development of mild rectal wall thickening and distal esophagus -03/02/2020--diet advanced to soft diet which pt tolerated -Needs daily bowel regimen after d/c--bisacodyl and simethicone  Diabetes mellitus type 2, controlled -Continue NovoLog sliding scale -05/23/2019 hemoglobin A1c 7.4 -Holding Metformin--restart after d/c  COVID-19 infection -Patient has been asymptomatic without hypoxia or tachypnea -Patient is fully vaccinated -this was incidental finding -discussed risks/benefits/alternatives of Regen-CoV with POA--she expressed understanding and wants patient to receive this -pt received Regen-CoV on 11/18  BPH/Urinary retention -Holding tamsulosin until the patient is able to tolerate p.o.>>restart -Holding finasteride into the patient is able to tolerate p.o.>>restart -patient has to be catheterized at least bid at home -d/c foley  and start intermittent caths  Pyuria -11/17 UA >50WBC -empiric ceftriaxone pending culture data -cultures unrevealing-->d/c ceftriaxone  Hyperlipidemia -Restarted atorvastatin once tolerating p.o.  Goals of care -Palliative medicine was consulted -DNR confirmed -Plan is to go home with home health care and possible hospice consult in the near future     Discharge Instructions   Allergies as of 03/04/2020   No  Known Allergies     Medication List    STOP taking these medications   GAS-X PO Replaced by: simethicone 40 MG/0.6ML drops   linaclotide 145 MCG Caps capsule Commonly known as: LINZESS   senna-docusate 8.6-50 MG tablet Commonly known as: Senokot-S     TAKE these medications   acetaminophen 500 MG tablet Commonly known as: TYLENOL Take 1,000 mg by mouth every 6 (six) hours as needed for mild pain or headache.   aspirin EC 81 MG tablet Take 1 tablet (81 mg total) by mouth daily with breakfast.   atorvastatin 20 MG tablet Commonly known as: LIPITOR Take 20 mg by mouth at bedtime.   bisacodyl 10 MG suppository Commonly known as: DULCOLAX Place 1 suppository (10 mg total) rectally daily. What changed:   when to take this  reasons to take this   cefdinir 300 MG capsule Commonly known as: OMNICEF Take 1 capsule (300 mg total) by mouth 2 (two) times daily.   finasteride 5 MG tablet Commonly known as: PROSCAR Take 1 tablet (5 mg total) by mouth daily.   magnesium oxide 400 MG tablet Commonly known as: MAG-OX Take 1 tablet (400 mg total) by mouth daily.   metFORMIN 850 MG tablet Commonly known as: Glucophage Take 1 tablet (850 mg total) by mouth 2 (two) times daily with a meal. What changed: how much to take   Potassium Chloride ER 20 MEQ Tbcr Take 20 mEq by mouth every Tuesday, Thursday, Saturday, and Sunday. 1 tab daily by mouth   simethicone 40 MG/0.6ML drops Commonly known as: MYLICON Take 1.2 mLs (80 mg total) by mouth 4 (four)  times daily. Replaces: GAS-X PO   tamsulosin 0.4 MG Caps capsule Commonly known as: FLOMAX Take 1 capsule (0.4 mg total) by mouth every evening.       No Known Allergies  Consultations:  General surgery   Procedures/Studies: DG Abd 1 View  Result Date: 03/01/2020 CLINICAL DATA:  Small-bowel obstruction.  Abdominal pain. EXAM: ABDOMEN - 1 VIEW COMPARISON:  11/11/2019 FINDINGS: Two supine views. Again demonstrated are gas-filled loops of small bowel, similar. Normal caliber colon. No free intraperitoneal air given limitations of supine exam. Radiation seeds in the prostate. IMPRESSION: Similar appearance of diffuse gaseous distension of small bowel loops which is likely related to distal small bowel obstruction, given CT appearance. Electronically Signed   By: Jeronimo GreavesKyle  Talbot M.D.   On: 03/01/2020 14:07   CT ABDOMEN PELVIS W CONTRAST  Result Date: 03/02/2020 CLINICAL DATA:  Bowel obstruction EXAM: CT ABDOMEN AND PELVIS WITH CONTRAST TECHNIQUE: Multidetector CT imaging of the abdomen and pelvis was performed using the standard protocol following bolus administration of intravenous contrast. CONTRAST:  100mL OMNIPAQUE IOHEXOL 300 MG/ML  SOLN COMPARISON:  02/28/2020 FINDINGS: Lower chest: Trace bilateral pleural effusions are present with associated mild bibasilar compressive atelectasis. The visualized heart and pericardium are unremarkable. The distal esophagus is circumferentially thick walled, a finding that can reflect esophagitis, as can be seen with reflux esophagitis. This is nonspecific Hepatobiliary: Surgical changes of left hepatectomy and cholecystectomy are identified. Residual right hepatic lobe is unremarkable. Pancreas: Unremarkable Spleen: Unremarkable Adrenals/Urinary Tract: Adrenal glands are unremarkable. Kidneys are normal in size and position. Multiple nonobstructing calculi are seen within the left kidney measuring up to 16 mm within the lower pole. An additional 4 mm  nonobstructing calculus is seen within the left renal pelvis. No nephro or urolithiasis on the right. No hydronephrosis. The bladder is circumferentially markedly thick walled and demonstrates  mild perivesicular inflammatory stranding suggesting changes of diffuse infectious or inflammatory cystitis. A Foley catheter balloon is now in place within the bladder lumen which is decompressed. Stomach/Bowel: The previously noted small bowel obstruction likely related to an internal hernia depicted on the coronal images of the prior examination has resolved. The small bowel is of normal caliber. Contrast has passed into the ascending colon. Additionally, there is moderate gaseous distension of the colon, however, this has also decreased significantly since prior examination. No free intraperitoneal gas or fluid. There is circumferential rectal wall thickening suggesting changes of mild proctitis, new since prior examination. Vascular/Lymphatic: Mild aortoiliac atherosclerotic calcification. No aortic aneurysm. No pathologic adenopathy within the abdomen and pelvis. Reproductive: Brachytherapy seeds are seen within the prostate gland. Other: Mild subcutaneous body wall edema is present within the dependent body wall in keeping with mild anasarca. Musculoskeletal: No no acute bone abnormality. IMPRESSION: Resolved small-bowel obstruction. Improved colonic ileus with decreasing gaseous distension of the colon. Interval development of mild rectal wall thickening possibly related to changes of mild proctitis. Circumferential wall thickening of the distal esophagus, possibly related to changes of esophagitis such as reflux esophagitis. This could be better assessed with endoscopy if indicated. Nonobstructing left nephrolithiasis and urolithiasis. No hydronephrosis. Marked circumferential wall thickening and perivesicular inflammatory stranding involving the bladder suggesting a diffuse underlying infectious or inflammatory  cystitis, similar to that noted on prior examination. Interval Foley catheter placement and decompression of the bladder. Aortic Atherosclerosis (ICD10-I70.0). Electronically Signed   By: Helyn Numbers MD   On: 03/02/2020 00:50   CT Abdomen Pelvis W Contrast  Result Date: 02/28/2020 CLINICAL DATA:  69 year old male with abdominal distension. EXAM: CT ABDOMEN AND PELVIS WITH CONTRAST TECHNIQUE: Multidetector CT imaging of the abdomen and pelvis was performed using the standard protocol following bolus administration of intravenous contrast. CONTRAST:  OMNIPAQUE IOHEXOL 300 MG/ML  SOLN COMPARISON:  CT abdomen pelvis dated 07/06/2019. FINDINGS: Lower chest: There are bibasilar atelectasis. No intra-abdominal free air or free fluid. Hepatobiliary: The liver is unremarkable. No intrahepatic biliary dilatation. Cholecystectomy. Pancreas: Unremarkable. No pancreatic ductal dilatation or surrounding inflammatory changes. Spleen: Normal in size without focal abnormality. Adrenals/Urinary Tract: The adrenal glands unremarkable. There is a 12 mm nonobstructing left renal inferior pole calculus. Additional smaller nonobstructing stones noted in the inferior pole of the left kidney. There is a 3 mm nonobstructing calculus in the left renal pelvis. There is mild left hydronephrosis. The right kidney is unremarkable. The visualized ureters appear unremarkable. There is diffuse thickened and trabeculated appearance of the bladder wall which may be related to chronic bladder outlet obstruction or chronic bladder dysfunction. Correlation with urinalysis recommended to exclude UTI Stomach/Bowel: Moderate to severe gastric distension. There is ingested content in the distal esophagus, likely reflux. Multiple dilated and fluid-filled loops of small bowel with air-fluid levels noted throughout the abdomen measuring up to 9.5 cm in the right upper quadrant. There is an area of twisting in the mid abdomen to the right of the  midline which involves a short segment of the sigmoid colon as well as the terminal ileum which may represent an internal hernia or volvulus. There is associated obstruction of the small bowel at the level of the distal/terminal ileum (coronal 43/5 and axial 49/2) there is moderate stool throughout the colon. No pneumatosis or evidence of bowel ischemia at this time. Vascular/Lymphatic: The abdominal aorta and IVC unremarkable. No portal venous gas. There is no adenopathy. Reproductive: Prostate fiducial markers. Other: None Musculoskeletal: Osteopenia  with degenerative changes of the spine. No acute osseous pathology. IMPRESSION: 1. Small-bowel obstruction with transition zone at the distal/terminal ileum secondary to volvulus or internal hernia. Surgical consult is advised. No pneumatosis or evidence of bowel ischemia at this time. 2. Multiple nonobstructing left renal calculi. Mild left hydronephrosis. 3. Thickened and trabeculated appearance of the bladder wall may be related to chronic bladder outlet obstruction or chronic bladder dysfunction. Correlation with urinalysis recommended to exclude UTI. Electronically Signed   By: Elgie Collard M.D.   On: 02/28/2020 16:57   DG Chest Portable 1 View  Result Date: 02/28/2020 CLINICAL DATA:  69 year old male with NG placement. EXAM: PORTABLE CHEST 1 VIEW COMPARISON:  Radiograph dated 07/05/2019. FINDINGS: An enteric tube with side-port in the distal esophagus and tip just distal to the GE junction. Recommend further advancing by additional 10 cm. Bibasilar atelectasis. No focal consolidation, pleural effusion or pneumothorax. The cardiac silhouette is within limits. Atherosclerotic calcification of the aorta. No acute osseous pathology. Degenerative changes of the spine. Distended stomach and loops of bowel in the visualized upper abdomen. Clinical correlation is recommended. IMPRESSION: 1. Enteric tube with side-port in the distal esophagus and tip just distal  to the GE junction. Recommend further advancing by additional 10 cm. 2. Bibasilar atelectasis. Electronically Signed   By: Elgie Collard M.D.   On: 02/28/2020 18:39        Discharge Exam: Vitals:   03/03/20 2110 03/04/20 0512  BP: (!) 145/73 (!) 131/55  Pulse: 70 74  Resp: 19 18  Temp: 97.7 F (36.5 C) 97.8 F (36.6 C)  SpO2: 97% 97%   Vitals:   03/03/20 1538 03/03/20 1711 03/03/20 2110 03/04/20 0512  BP: 111/61 109/85 (!) 145/73 (!) 131/55  Pulse: 62 68 70 74  Resp: Temp: 97.8 F (36.6 C) 97.7 F (36.5 C) 97.7 F (36.5 C) 97.8 F (36.6 C)  TempSrc: Oral Oral  Axillary  SpO2: 98% 100% 97% 97%  Weight:      Height:        General: Pt is alert, awake, not in acute distress Cardiovascular: RRR, S1/S2 +, no rubs, no gallops Respiratory: bibasilar crackles. No wheeze Abdominal: Soft, NT, ND, bowel sounds + Extremities: no edema, no cyanosis   The results of significant diagnostics from this hospitalization (including imaging, microbiology, ancillary and laboratory) are listed below for reference.    Significant Diagnostic Studies: DG Abd 1 View  Result Date: 03/01/2020 CLINICAL DATA:  Small-bowel obstruction.  Abdominal pain. EXAM: ABDOMEN - 1 VIEW COMPARISON:  11/11/2019 FINDINGS: Two supine views. Again demonstrated are gas-filled loops of small bowel, similar. Normal caliber colon. No free intraperitoneal air given limitations of supine exam. Radiation seeds in the prostate. IMPRESSION: Similar appearance of diffuse gaseous distension of small bowel loops which is likely related to distal small bowel obstruction, given CT appearance. Electronically Signed   By: Jeronimo Greaves M.D.   On: 03/01/2020 14:07   CT ABDOMEN PELVIS W CONTRAST  Result Date: 03/02/2020 CLINICAL DATA:  Bowel obstruction EXAM: CT ABDOMEN AND PELVIS WITH CONTRAST TECHNIQUE: Multidetector CT imaging of the abdomen and pelvis was performed using the standard protocol following bolus  administration of intravenous contrast. CONTRAST:  OMNIPAQUE IOHEXOL 300 MG/ML  SOLN COMPARISON:  02/28/2020 FINDINGS: Lower chest: Trace bilateral pleural effusions are present with associated mild bibasilar compressive atelectasis. The visualized heart and pericardium are unremarkable. The distal esophagus is circumferentially thick walled, a finding that can reflect esophagitis, as  can be seen with reflux esophagitis. This is nonspecific Hepatobiliary: Surgical changes of left hepatectomy and cholecystectomy are identified. Residual right hepatic lobe is unremarkable. Pancreas: Unremarkable Spleen: Unremarkable Adrenals/Urinary Tract: Adrenal glands are unremarkable. Kidneys are normal in size and position. Multiple nonobstructing calculi are seen within the left kidney measuring up to 16 mm within the lower pole. An additional 4 mm nonobstructing calculus is seen within the left renal pelvis. No nephro or urolithiasis on the right. No hydronephrosis. The bladder is circumferentially markedly thick walled and demonstrates mild perivesicular inflammatory stranding suggesting changes of diffuse infectious or inflammatory cystitis. A Foley catheter balloon is now in place within the bladder lumen which is decompressed. Stomach/Bowel: The previously noted small bowel obstruction likely related to an internal hernia depicted on the coronal images of the prior examination has resolved. The small bowel is of normal caliber. Contrast has passed into the ascending colon. Additionally, there is moderate gaseous distension of the colon, however, this has also decreased significantly since prior examination. No free intraperitoneal gas or fluid. There is circumferential rectal wall thickening suggesting changes of mild proctitis, new since prior examination. Vascular/Lymphatic: Mild aortoiliac atherosclerotic calcification. No aortic aneurysm. No pathologic adenopathy within the abdomen and pelvis. Reproductive:  Brachytherapy seeds are seen within the prostate gland. Other: Mild subcutaneous body wall edema is present within the dependent body wall in keeping with mild anasarca. Musculoskeletal: No no acute bone abnormality. IMPRESSION: Resolved small-bowel obstruction. Improved colonic ileus with decreasing gaseous distension of the colon. Interval development of mild rectal wall thickening possibly related to changes of mild proctitis. Circumferential wall thickening of the distal esophagus, possibly related to changes of esophagitis such as reflux esophagitis. This could be better assessed with endoscopy if indicated. Nonobstructing left nephrolithiasis and urolithiasis. No hydronephrosis. Marked circumferential wall thickening and perivesicular inflammatory stranding involving the bladder suggesting a diffuse underlying infectious or inflammatory cystitis, similar to that noted on prior examination. Interval Foley catheter placement and decompression of the bladder. Aortic Atherosclerosis (ICD10-I70.0). Electronically Signed   By: Helyn Numbers MD   On: 03/02/2020 00:50   CT Abdomen Pelvis W Contrast  Result Date: 02/28/2020 CLINICAL DATA:  68 year old male with abdominal distension. EXAM: CT ABDOMEN AND PELVIS WITH CONTRAST TECHNIQUE: Multidetector CT imaging of the abdomen and pelvis was performed using the standard protocol following bolus administration of intravenous contrast. CONTRAST:  OMNIPAQUE IOHEXOL 300 MG/ML  SOLN COMPARISON:  CT abdomen pelvis dated 07/06/2019. FINDINGS: Lower chest: There are bibasilar atelectasis. No intra-abdominal free air or free fluid. Hepatobiliary: The liver is unremarkable. No intrahepatic biliary dilatation. Cholecystectomy. Pancreas: Unremarkable. No pancreatic ductal dilatation or surrounding inflammatory changes. Spleen: Normal in size without focal abnormality. Adrenals/Urinary Tract: The adrenal glands unremarkable. There is a 12 mm nonobstructing left renal  inferior pole calculus. Additional smaller nonobstructing stones noted in the inferior pole of the left kidney. There is a 3 mm nonobstructing calculus in the left renal pelvis. There is mild left hydronephrosis. The right kidney is unremarkable. The visualized ureters appear unremarkable. There is diffuse thickened and trabeculated appearance of the bladder wall which may be related to chronic bladder outlet obstruction or chronic bladder dysfunction. Correlation with urinalysis recommended to exclude UTI Stomach/Bowel: Moderate to severe gastric distension. There is ingested content in the distal esophagus, likely reflux. Multiple dilated and fluid-filled loops of small bowel with air-fluid levels noted throughout the abdomen measuring up to 9.5 cm in the right upper quadrant. There is an area of twisting in the  mid abdomen to the right of the midline which involves a short segment of the sigmoid colon as well as the terminal ileum which may represent an internal hernia or volvulus. There is associated obstruction of the small bowel at the level of the distal/terminal ileum (coronal 43/5 and axial 49/2) there is moderate stool throughout the colon. No pneumatosis or evidence of bowel ischemia at this time. Vascular/Lymphatic: The abdominal aorta and IVC unremarkable. No portal venous gas. There is no adenopathy. Reproductive: Prostate fiducial markers. Other: None Musculoskeletal: Osteopenia with degenerative changes of the spine. No acute osseous pathology. IMPRESSION: 1. Small-bowel obstruction with transition zone at the distal/terminal ileum secondary to volvulus or internal hernia. Surgical consult is advised. No pneumatosis or evidence of bowel ischemia at this time. 2. Multiple nonobstructing left renal calculi. Mild left hydronephrosis. 3. Thickened and trabeculated appearance of the bladder wall may be related to chronic bladder outlet obstruction or chronic bladder dysfunction. Correlation with  urinalysis recommended to exclude UTI. Electronically Signed   By: Elgie Collard M.D.   On: 02/28/2020 16:57   DG Chest Portable 1 View  Result Date: 02/28/2020 CLINICAL DATA:  69 year old male with NG placement. EXAM: PORTABLE CHEST 1 VIEW COMPARISON:  Radiograph dated 07/05/2019. FINDINGS: An enteric tube with side-port in the distal esophagus and tip just distal to the GE junction. Recommend further advancing by additional 10 cm. Bibasilar atelectasis. No focal consolidation, pleural effusion or pneumothorax. The cardiac silhouette is within limits. Atherosclerotic calcification of the aorta. No acute osseous pathology. Degenerative changes of the spine. Distended stomach and loops of bowel in the visualized upper abdomen. Clinical correlation is recommended. IMPRESSION: 1. Enteric tube with side-port in the distal esophagus and tip just distal to the GE junction. Recommend further advancing by additional 10 cm. 2. Bibasilar atelectasis. Electronically Signed   By: Elgie Collard M.D.   On: 02/28/2020 18:39     Microbiology: Recent Results (from the past 240 hour(s))  Respiratory Panel by RT PCR (Flu A&B, Covid) - Nasopharyngeal Swab     Status: Abnormal   Collection Time: 02/28/20  5:06 PM   Specimen: Nasopharyngeal Swab  Result Value Ref Range Status   SARS Coronavirus 2 by RT PCR POSITIVE (A) NEGATIVE Final    Comment: CRITICAL RESULT CALLED TO, READ BACK BY AND VERIFIED WITH: R GENTRY AT 1857 ON 02/28/2020 BY MOSLEY,J (NOTE) SARS-CoV-2 target nucleic acids are DETECTED.  SARS-CoV-2 RNA is generally detectable in upper respiratory specimens  during the acute phase of infection. Positive results are indicative of the presence of the identified virus, but do not rule out bacterial infection or co-infection with other pathogens not detected by the test. Clinical correlation with patient history and other diagnostic information is necessary to determine patient infection status. The  expected result is Negative.  Fact Sheet for Patients:  https://www.moore.com/  Fact Sheet for Healthcare Providers: https://www.young.biz/  This test is not yet approved or cleared by the [place] FDA and  has been authorized for detection and/or diagnosis of SARS-CoV-2 by FDA under an Emergency Use Authorization (EUA).  This EUA will remain in effect (meaning  this test can be used) for the duration of  the COVID-19 declaration under Section 564(b)(1) of the Act, 21 U.S.C. section 360bbb-3(b)(1), unless the authorization is terminated or revoked sooner.      Influenza A by PCR NEGATIVE NEGATIVE Final   Influenza B by PCR NEGATIVE NEGATIVE Final    Comment: (NOTE) The Xpert Xpress SARS-CoV-2/FLU/RSV assay is  intended as an aid in  the diagnosis of influenza from Nasopharyngeal swab specimens and  should not be used as a sole basis for treatment. Nasal washings and  aspirates are unacceptable for Xpert [employer] SARS-CoV-2/FLU/RSV  testing.  Fact Sheet for Patients: https://www.moore.com/  Fact Sheet for Healthcare Providers: https://www.young.biz/  This test is not yet approved or cleared by the [place] FDA and  has been authorized for detection and/or diagnosis of SARS-CoV-2 by  FDA under an Emergency Use Authorization (EUA). This EUA will remain  in effect (meaning this test can be used) for the duration of the  Covid-19 declaration under Section 564(b)(1) of the Act, 21  U.S.C. section 360bbb-3(b)(1), unless the authorization is  terminated or revoked. Performed at [facility], [address], [place], [place] [address]   Culture, Urine     Status: Abnormal   Collection Time: 03/02/20  6:24 PM   Specimen: Urine, Catheterized  Result Value Ref Range Status   Specimen Description   Final    URINE, CATHETERIZED Performed at [facility], [address] [place], [place] [address]     Special Requests   Final    NONE Performed at [facility], [address], [place], [place] [address]    Culture MULTIPLE SPECIES PRESENT, SUGGEST RECOLLECTION (A)  Final   Report Status 03/04/2020 FINAL  Final     Labs: Basic Metabolic Panel: Recent Labs  Lab 02/28/20 1432 02/28/20 1432 02/29/20 0521 02/29/20 0521 03/03/20 0529 03/04/20 0645  NA 135  --  139  --  137 135  K 4.4   < > 4.2   < > 3.5 3.2*  CL 97*  --  98  --  104 103  CO2 26  --  26  --  25 26  GLUCOSE 168*  --  165*  --  117* 140*  BUN 27*  --  30*  --  8 8  CREATININE 1.11  --  0.94  --  0.67 0.69  CALCIUM 10.5*  --  9.3  --  8.5* 8.3*  MG  --   --   --   --  1.7  --   PHOS  --   --   --   --  2.6  --    < > = values in this interval not displayed.   Liver Function Tests: Recent Labs  Lab 02/28/20 1432 03/04/20 0645  AST 22 12*  ALT 17 12  ALKPHOS 84 53  BILITOT 0.6 0.5  PROT 9.4* 5.8*  ALBUMIN 4.5 2.7*   No results for input(s): LIPASE, AMYLASE in the last 168 hours. No results for input(s): AMMONIA in the last 168 hours. CBC: Recent Labs  Lab 02/29/20 0521 03/01/20 0834 03/02/20 0712 03/03/20 0529 03/04/20 0645  WBC 9.3 8.1 5.5 5.3 6.3  NEUTROABS 7.9* 6.6 3.6 3.4 4.5  HGB 12.3* 10.8* 10.4* 11.5* 11.2*  HCT 40.8 37.0* 36.1* 39.2 37.2*  MCV 86.1 88.7 88.3 89.1 86.1  PLT 232 189 181 185 197   Cardiac Enzymes: No results for input(s): CKTOTAL, CKMB, CKMBINDEX, TROPONINI in the last 168 hours. BNP: Invalid input(s): POCBNP CBG: Recent Labs  Lab 03/02/20 2219 03/03/20 0541 03/03/20 1452 03/03/20 2107 03/04/20 0603  GLUCAP 157* 115* 159* 153* 124*    Time coordinating discharge:  36 minutes  Signed:  Catarina Hartshorn, DO [facility] Pager: [phone] 03/04/2020, 10:17 AM

## 2020-03-03 NOTE — TOC Initial Note (Signed)
Transition of Care [facility]) - Initial/Assessment Note    Patient Details  Name: [name] MRN: [id number] Date of Birth: 07-24-1950  [facility] CM/SW Contact:    Annice Needy, LCSW Phone Number: 03/03/2020, 3:51 PM  Clinical Narrative:                 Patient from home, admitted for SBO, COVID+. Patient recommended for SNF. Family wants HHPT. Prefers [facility] as they have had them in the past. Referral made to [facility] and accepted for RN/PT.  Expected Discharge Plan: Home w Home Health Services Barriers to Discharge: Continued Medical Work up   Patient Goals and CMS Choice     Choice offered to / list presented to :  [name] Long, patient's aunt)  Expected Discharge Plan and Services Expected Discharge Plan: Home w Home Health Services     Post Acute Care Choice: Home Health Living arrangements for the past 2 months: Single Family Home                                      Prior Living Arrangements/Services Living arrangements for the past 2 months: Single Family Home Lives with:: Relatives Patient language and need for interpreter reviewed:: Yes Do you feel safe going back to the place where you live?: Yes      Need for Family Participation in Patient Care: Yes (Comment) Care giver support system in place?: Yes (comment)   Criminal Activity/Legal Involvement Pertinent to Current Situation/Hospitalization: No - Comment as needed  Activities of Daily Living Home Assistive Devices/Equipment: Walker (specify type) ADL Screening (condition at time of admission) Patient's cognitive ability adequate to safely complete daily activities?: No Is the patient deaf or have difficulty hearing?: No Does the patient have difficulty seeing, even when wearing glasses/contacts?: No Does the patient have difficulty concentrating, remembering, or making decisions?: Yes Patient able to express need for assistance with ADLs?: No Does the patient have difficulty  dressing or bathing?: Yes Independently performs ADLs?: No Communication: Needs assistance Is this a change from baseline?: Pre-admission baseline Dressing (OT): Needs assistance Is this a change from baseline?: Pre-admission baseline Grooming: Needs assistance, Dependent Is this a change from baseline?: Pre-admission baseline Feeding: Needs assistance Is this a change from baseline?: Pre-admission baseline Bathing: Dependent Is this a change from baseline?: Pre-admission baseline Toileting: Needs assistance Is this a change from baseline?: Pre-admission baseline In/Out Bed: Needs assistance Is this a change from baseline?: Change from baseline, expected to last <3 days Walks in Home: Needs assistance Is this a change from baseline?: Change from baseline, expected to last <3 days Does the patient have difficulty walking or climbing stairs?: Yes Weakness of Legs: Both Weakness of Arms/Hands: None  Permission Sought/Granted Permission sought to share information with : Family Supports    Share Information with NAME: Investment banker, operational     Permission granted to share info w Relationship: aunt     Emotional Assessment Appearance:: Appears younger than stated age   Affect (typically observed): Appropriate   Alcohol / Substance Use: Not Applicable Psych Involvement: No (comment)  Admission diagnosis:  Abdominal distention [R14.0] SBO (small bowel obstruction) (HCC) [K56.609] Fall, initial encounter [W19.XXXA] Patient Active Problem List   Diagnosis Date Noted  . Goals of care, counseling/discussion   . Encounter for hospice care discussion   . COVID-19 virus infection 02/28/2020  . Palliative care by specialist   .  Pneumoperitoneum 07/04/2019  . Intra-abdominal free air of unknown etiology 07/04/2019  . Cystitis   . Chronic cholecystitis 05/21/2019  . Chronic constipation 03/31/2019  . Cholecystostomy care (HCC) 11/04/2018  . Acute lower UTI 09/18/2018  . Abdominal distention  09/18/2018  . Hypomagnesemia 09/09/2018  . Protein-calorie malnutrition, severe (HCC) 09/09/2018  . Acute blood loss anemia 09/09/2018  . Gangrenous cholecystitis 09/05/2018  . Volvulus (HCC) 09/03/2018  . Ileus (HCC)   . Pneumatosis intestinalis   . Gall bladder disease 08/25/2018  . Small bowel ischemia (HCC)   . Acute gangrenous cholecystitis 05/24/2018  . SBO (small bowel obstruction) (HCC) 05/24/2018  . Abdominal distension   . Pressure injury of skin 04/09/2018  . C. difficile diarrhea 04/08/2018  . Bilateral hydronephrosis 04/08/2018  . Pneumoperitoneum of unknown etiology   . Diarrhea 04/07/2018  . Nonverbal 04/07/2018  . Hydronephrosis with urinary obstruction due to ureteral calculus 06/20/2016  . Sepsis secondary to UTI (HCC) 06/20/2016  . Fever 06/19/2016  . Generalized weakness 06/19/2016  . Diabetes mellitus type 2, noninsulin dependent (HCC) 06/19/2016  . Severe sepsis with septic shock (HCC) 06/19/2016  . UTI (urinary tract infection) 06/19/2016  . Hematuria 06/19/2016  . AKI (acute kidney injury) (HCC) 06/19/2016  . Hyponatremia 06/19/2016  . Hypokalemia 06/19/2016  . BPH (benign prostatic hyperplasia) 06/19/2016  . Megacolon 05/07/2014  . Urinary retention 05/07/2014  . Intellectual disability 05/07/2014  . Hyperlipidemia 05/07/2014  . HTN (hypertension) 05/07/2014  . Renal calculus 01/14/2012   PCP:  Pearson Grippe, MD Pharmacy:   [facility] [phone] - [place], [place] - [address] AT [facility] [address] [place] [address] Phone: [phone] Fax: [phone]  [facility] - [place], [place] - [address] [place] [address] Phone: [phone] Fax: [phone]     Social Determinants of Health (SDOH) Interventions    Readmission Risk Interventions Readmission Risk Prevention Plan 05/26/2019 09/20/2018 09/19/2018  Transportation Screening Complete  Complete Complete  PCP or Specialist Appt within 3-5 Days Not Complete - -  HRI or Home Care Consult Complete - -  Social Work Consult for Recovery Care Planning/Counseling Complete - -  Palliative Care Screening Not Complete - -  Medication Review (RN Care Manager) Complete Complete Complete  PCP or Specialist appointment within 3-5 days of discharge - Complete Complete  PCP/Specialist Appt Not Complete comments - - SNF resident. Sees facility MD  HRI or Home Care Consult - Not Complete -  HRI or Home Care Consult Pt Refusal Comments - Patient is discharging to SNF for rehab -  SW Recovery Care/Counseling Consult - Complete -  Palliative Care Screening - Not Applicable -  Skilled Nursing Facility - Complete -  Some recent data might be hidden

## 2020-03-03 NOTE — Progress Notes (Signed)
16 Fr catheter removed per MD orders. Catheter intact upon removal, pt tolerated skill well.

## 2020-03-03 NOTE — Progress Notes (Signed)
[facility] Progress Note     Subjective: Tolerating diet. Abdomen soft and distended.   Objective: Vital signs in last 24 hours: Temp:  [97.6 F (36.4 C)] 97.6 F (36.4 C) (11/17 2037) Pulse Rate:  [57-70] 70 (11/18 0828) Resp:  [16-18] 18 (11/18 0828) BP: (113-145)/(56-73) 145/73 (11/18 0828) SpO2:  [97 %-100 %] 99 % (11/18 0828) Last BM Date: 03/02/20  Intake/Output from previous day: 11/17 0701 - 11/18 0700 In: 2896 [P.O.:1906; I.V.:990] Out: 4750 [Urine:4750] Intake/Output this shift: No intake/output data recorded.  General appearance: alert, cooperative and no distress Resp: normal work of breathing GI: soft, distended chronically, nontender  Lab Results:  Recent Labs    03/02/20 0712 03/03/20 0529  WBC 5.5 5.3  HGB 10.4* 11.5*  HCT 36.1* 39.2  PLT 181 185   BMET Recent Labs    03/03/20 0529  NA 137  K 3.5  CL 104  CO2 25  GLUCOSE 117*  BUN 8  CREATININE 0.67  CALCIUM 8.5*   PT/INR No results for input(s): LABPROT, INR in the last 72 hours.  Studies/Results: DG Abd 1 View  Result Date: 03/01/2020 CLINICAL DATA:  Small-bowel obstruction.  Abdominal pain. EXAM: ABDOMEN - 1 VIEW COMPARISON:  11/11/2019 FINDINGS: Two supine views. Again demonstrated are gas-filled loops of small bowel, similar. Normal caliber colon. No free intraperitoneal air given limitations of supine exam. Radiation seeds in the prostate. IMPRESSION: Similar appearance of diffuse gaseous distension of small bowel loops which is likely related to distal small bowel obstruction, given CT appearance. Electronically Signed   By: Jeronimo Greaves M.D.   On: 03/01/2020 14:07   CT ABDOMEN PELVIS W CONTRAST  Result Date: 03/02/2020 CLINICAL DATA:  Bowel obstruction EXAM: CT ABDOMEN AND PELVIS WITH CONTRAST TECHNIQUE: Multidetector CT imaging of the abdomen and pelvis was performed using the standard protocol following bolus administration of intravenous contrast. CONTRAST:   OMNIPAQUE IOHEXOL 300 MG/ML  SOLN COMPARISON:  02/28/2020 FINDINGS: Lower chest: Trace bilateral pleural effusions are present with associated mild bibasilar compressive atelectasis. The visualized heart and pericardium are unremarkable. The distal esophagus is circumferentially thick walled, a finding that can reflect esophagitis, as can be seen with reflux esophagitis. This is nonspecific Hepatobiliary: Surgical changes of left hepatectomy and cholecystectomy are identified. Residual right hepatic lobe is unremarkable. Pancreas: Unremarkable Spleen: Unremarkable Adrenals/Urinary Tract: Adrenal glands are unremarkable. Kidneys are normal in size and position. Multiple nonobstructing calculi are seen within the left kidney measuring up to 16 mm within the lower pole. An additional 4 mm nonobstructing calculus is seen within the left renal pelvis. No nephro or urolithiasis on the right. No hydronephrosis. The bladder is circumferentially markedly thick walled and demonstrates mild perivesicular inflammatory stranding suggesting changes of diffuse infectious or inflammatory cystitis. A Foley catheter balloon is now in place within the bladder lumen which is decompressed. Stomach/Bowel: The previously noted small bowel obstruction likely related to an internal hernia depicted on the coronal images of the prior examination has resolved. The small bowel is of normal caliber. Contrast has passed into the ascending colon. Additionally, there is moderate gaseous distension of the colon, however, this has also decreased significantly since prior examination. No free intraperitoneal gas or fluid. There is circumferential rectal wall thickening suggesting changes of mild proctitis, new since prior examination. Vascular/Lymphatic: Mild aortoiliac atherosclerotic calcification. No aortic aneurysm. No pathologic adenopathy within the abdomen and pelvis. Reproductive: Brachytherapy seeds are seen within the prostate gland.  Other: Mild subcutaneous body wall edema  is present within the dependent body wall in keeping with mild anasarca. Musculoskeletal: No no acute bone abnormality. IMPRESSION: Resolved small-bowel obstruction. Improved colonic ileus with decreasing gaseous distension of the colon. Interval development of mild rectal wall thickening possibly related to changes of mild proctitis. Circumferential wall thickening of the distal esophagus, possibly related to changes of esophagitis such as reflux esophagitis. This could be better assessed with endoscopy if indicated. Nonobstructing left nephrolithiasis and urolithiasis. No hydronephrosis. Marked circumferential wall thickening and perivesicular inflammatory stranding involving the bladder suggesting a diffuse underlying infectious or inflammatory cystitis, similar to that noted on prior examination. Interval Foley catheter placement and decompression of the bladder. Aortic Atherosclerosis (ICD10-I70.0). Electronically Signed   By: Helyn Numbers MD   On: 03/02/2020 00:50    Anti-infectives: Anti-infectives (From admission, onward)   Start     Dose/Rate Route Frequency Ordered Stop   03/03/20 1130  cefTRIAXone (ROCEPHIN) 1 g in sodium chloride 0.9 % 100 mL IVPB        1 g 200 mL/hr over 30 Minutes Intravenous Every 24 hours 03/03/20 1056        Assessment/Plan: Mr. Dendinger is a 69 yo with SOB doing well and resolving back to baseline. Diet as tolerated Continue home suppositories and simethicone No need for follow up as outpatient     LOS: 4 days    [name] 03/03/2020

## 2020-03-03 NOTE — Plan of Care (Signed)
  Problem: Nutrition Goal: Patient maintains adequate hydration Outcome: Progressing Goal: Patient maintains weight Outcome: Progressing Goal: Patient/Family demonstrates understanding of diet Outcome: Progressing Goal: Patient/Family independently completes tube feeding Outcome: Progressing Goal: Patient will have no more than 5 lb weight change during LOS Outcome: Progressing Goal: Patient will utilize adaptive techniques to administer nutrition Outcome: Progressing   Problem: Education: Goal: Knowledge of General Education information will improve Description: Including pain rating scale, medication(s)/side effects and non-pharmacologic comfort measures Outcome: Progressing   Problem: Health Behavior/Discharge Planning: Goal: Ability to manage health-related needs will improve Outcome: Progressing   Problem: Clinical Measurements: Goal: Ability to maintain clinical measurements within normal limits will improve Outcome: Progressing Goal: Will remain free from infection Outcome: Progressing Goal: Diagnostic test results will improve Outcome: Progressing Goal: Respiratory complications will improve Outcome: Progressing Goal: Cardiovascular complication will be avoided Outcome: Progressing   Problem: Activity: Goal: Risk for activity intolerance will decrease Outcome: Progressing   Problem: Nutrition: Goal: Adequate nutrition will be maintained Outcome: Progressing   Problem: Coping: Goal: Level of anxiety will decrease Outcome: Progressing   Problem: Elimination: Goal: Will not experience complications related to bowel motility Outcome: Progressing Goal: Will not experience complications related to urinary retention Outcome: Progressing   Problem: Pain Managment: Goal: General experience of comfort will improve Outcome: Progressing   Problem: Safety: Goal: Ability to remain free from injury will improve Outcome: Progressing   Problem: Skin  Integrity: Goal: Risk for impaired skin integrity will decrease Outcome: Progressing

## 2020-03-03 NOTE — Progress Notes (Signed)
Bladder scan completed due to no output since 1300 after urinary catheter removal. Abdomen appears distended. Bladder scan shows present. MD notified.

## 2020-03-03 NOTE — Progress Notes (Signed)
In and out performed due to bladder scan reading and 400cc removed.

## 2020-03-03 NOTE — Progress Notes (Signed)
Pt continues on non violent soft wrist restraints due to pulling at IV lines. Skin assessed under restraints at the 1800 interval and redness noted under left restraint. No breakage of skin noted, no edema and no circulation issues noted. Pulse is strong and equal left radial, cap refill < 3 seconds. Foam dressing applied for cushion. No indications that pt can come off of restraints at this time due to pulling of lines. Charge nurse notified.

## 2020-03-03 NOTE — Progress Notes (Signed)
Pt tolerated soft diet well.

## 2020-03-04 ENCOUNTER — Telehealth: Payer: Self-pay | Admitting: Urology

## 2020-03-04 DIAGNOSIS — K562 Volvulus: Secondary | ICD-10-CM | POA: Diagnosis not present

## 2020-03-04 DIAGNOSIS — U071 COVID-19: Secondary | ICD-10-CM | POA: Diagnosis not present

## 2020-03-04 DIAGNOSIS — R140 Abdominal distension (gaseous): Secondary | ICD-10-CM | POA: Diagnosis not present

## 2020-03-04 DIAGNOSIS — F79 Unspecified intellectual disabilities: Secondary | ICD-10-CM | POA: Diagnosis not present

## 2020-03-04 DIAGNOSIS — K56609 Unspecified intestinal obstruction, unspecified as to partial versus complete obstruction: Secondary | ICD-10-CM | POA: Diagnosis not present

## 2020-03-04 LAB — CBC WITH DIFFERENTIAL/PLATELET
Abs Immature Granulocytes: 0.01 10*3/uL (ref 0.00–0.07)
Basophils Absolute: 0 10*3/uL (ref 0.0–0.1)
Basophils Relative: 0 %
Eosinophils Absolute: 0.1 10*3/uL (ref 0.0–0.5)
Eosinophils Relative: 2 %
HCT: 37.2 % — ABNORMAL LOW (ref 39.0–52.0)
Hemoglobin: 11.2 g/dL — ABNORMAL LOW (ref 13.0–17.0)
Immature Granulocytes: 0 %
Lymphocytes Relative: 19 %
Lymphs Abs: 1.2 10*3/uL (ref 0.7–4.0)
MCH: 25.9 pg — ABNORMAL LOW (ref 26.0–34.0)
MCHC: 30.1 g/dL (ref 30.0–36.0)
MCV: 86.1 fL (ref 80.0–100.0)
Monocytes Absolute: 0.5 10*3/uL (ref 0.1–1.0)
Monocytes Relative: 8 %
Neutro Abs: 4.5 10*3/uL (ref 1.7–7.7)
Neutrophils Relative %: 71 %
Platelets: 197 10*3/uL (ref 150–400)
RBC: 4.32 MIL/uL (ref 4.22–5.81)
RDW: 16.4 % — ABNORMAL HIGH (ref 11.5–15.5)
WBC: 6.3 10*3/uL (ref 4.0–10.5)
nRBC: 0 % (ref 0.0–0.2)

## 2020-03-04 LAB — URINE CULTURE

## 2020-03-04 LAB — COMPREHENSIVE METABOLIC PANEL
ALT: 12 U/L (ref 0–44)
AST: 12 U/L — ABNORMAL LOW (ref 15–41)
Albumin: 2.7 g/dL — ABNORMAL LOW (ref 3.5–5.0)
Alkaline Phosphatase: 53 U/L (ref 38–126)
Anion gap: 6 (ref 5–15)
BUN: 8 mg/dL (ref 8–23)
CO2: 26 mmol/L (ref 22–32)
Calcium: 8.3 mg/dL — ABNORMAL LOW (ref 8.9–10.3)
Chloride: 103 mmol/L (ref 98–111)
Creatinine, Ser: 0.69 mg/dL (ref 0.61–1.24)
GFR, Estimated: >60 mL/min (ref >60)
Glucose, Bld: 140 mg/dL — ABNORMAL HIGH (ref 70–99)
Potassium: 3.2 mmol/L — ABNORMAL LOW (ref 3.5–5.1)
Sodium: 135 mmol/L (ref 135–145)
Total Bilirubin: 0.5 mg/dL (ref 0.3–1.2)
Total Protein: 5.8 g/dL — ABNORMAL LOW (ref 6.5–8.1)

## 2020-03-04 LAB — D-DIMER, QUANTITATIVE: D-Dimer, Quant: 1.38 ug/mL-FEU — ABNORMAL HIGH (ref 0.00–0.50)

## 2020-03-04 LAB — C-REACTIVE PROTEIN: CRP: 0.6 mg/dL (ref <1.0)

## 2020-03-04 LAB — GLUCOSE, CAPILLARY: Glucose-Capillary: 124 mg/dL — ABNORMAL HIGH (ref 70–99)

## 2020-03-04 LAB — FERRITIN: Ferritin: 16 ng/mL — ABNORMAL LOW (ref 24–336)

## 2020-03-04 MED ORDER — CEFDINIR 300 MG PO CAPS: 300.0000 mg | ORAL_CAPSULE | Freq: Two times a day (BID) | ORAL | 0 refills | Status: DC

## 2020-03-04 MED ORDER — POTASSIUM CHLORIDE 20 MEQ/15ML (10%) PO SOLN
40.0000 meq | Freq: Once | ORAL | Status: AC
  Administered 2020-03-04: 40 meq via ORAL
  Filled 2020-03-04: qty 30

## 2020-03-04 NOTE — Telephone Encounter (Signed)
Patients caretaker called and stated he is in the hospital as of today. She wants to know if they should cancel the upcoming appointment with Dr Ronne Binning?

## 2020-03-04 NOTE — Plan of Care (Signed)
Problem: Nutrition Goal: Patient maintains adequate hydration 03/04/2020 0755 by Zachery Dakins, RN Outcome: Progressing 03/04/2020 0755 by Zachery Dakins, RN Outcome: Progressing Goal: Patient maintains weight 03/04/2020 0755 by Zachery Dakins, RN Outcome: Progressing 03/04/2020 0755 by Zachery Dakins, RN Outcome: Progressing Goal: Patient/Family demonstrates understanding of diet 03/04/2020 0755 by Zachery Dakins, RN Outcome: Progressing 03/04/2020 0755 by Zachery Dakins, RN Outcome: Progressing Goal: Patient/Family independently completes tube feeding 03/04/2020 0755 by Zachery Dakins, RN Outcome: Progressing 03/04/2020 0755 by Zachery Dakins, RN Outcome: Progressing Goal: Patient will have no more than 5 lb weight change during LOS 03/04/2020 0755 by Zachery Dakins, RN Outcome: Progressing 03/04/2020 0755 by Zachery Dakins, RN Outcome: Progressing Goal: Patient will utilize adaptive techniques to administer nutrition 03/04/2020 0755 by Zachery Dakins, RN Outcome: Progressing 03/04/2020 0755 by Zachery Dakins, RN Outcome: Progressing Goal: Patient will verbalize dietary restrictions 03/04/2020 0755 by Zachery Dakins, RN Outcome: Progressing 03/04/2020 0755 by Zachery Dakins, RN Outcome: Progressing   Problem: Education: Goal: Knowledge of General Education information will improve Description: Including pain rating scale, medication(s)/side effects and non-pharmacologic comfort measures 03/04/2020 0755 by Zachery Dakins, RN Outcome: Progressing 03/04/2020 0755 by Zachery Dakins, RN Outcome: Progressing   Problem: Health Behavior/Discharge Planning: Goal: Ability to manage health-related needs will improve 03/04/2020 0755 by Zachery Dakins, RN Outcome: Progressing 03/04/2020 0755 by Zachery Dakins, RN Outcome: Progressing   Problem: Clinical  Measurements: Goal: Ability to maintain clinical measurements within normal limits will improve 03/04/2020 0755 by Zachery Dakins, RN Outcome: Progressing 03/04/2020 0755 by Zachery Dakins, RN Outcome: Progressing Goal: Will remain free from infection 03/04/2020 0755 by Zachery Dakins, RN Outcome: Progressing 03/04/2020 0755 by Zachery Dakins, RN Outcome: Progressing Goal: Diagnostic test results will improve 03/04/2020 0755 by Zachery Dakins, RN Outcome: Progressing 03/04/2020 0755 by Zachery Dakins, RN Outcome: Progressing Goal: Respiratory complications will improve 03/04/2020 0755 by Zachery Dakins, RN Outcome: Progressing 03/04/2020 0755 by Zachery Dakins, RN Outcome: Progressing Goal: Cardiovascular complication will be avoided 03/04/2020 0755 by Zachery Dakins, RN Outcome: Progressing 03/04/2020 0755 by Zachery Dakins, RN Outcome: Progressing   Problem: Activity: Goal: Risk for activity intolerance will decrease 03/04/2020 0755 by Zachery Dakins, RN Outcome: Progressing 03/04/2020 0755 by Zachery Dakins, RN Outcome: Progressing   Problem: Nutrition: Goal: Adequate nutrition will be maintained 03/04/2020 0755 by Zachery Dakins, RN Outcome: Progressing 03/04/2020 0755 by Zachery Dakins, RN Outcome: Progressing   Problem: Coping: Goal: Level of anxiety will decrease 03/04/2020 0755 by Zachery Dakins, RN Outcome: Progressing 03/04/2020 0755 by Zachery Dakins, RN Outcome: Progressing   Problem: Elimination: Goal: Will not experience complications related to bowel motility 03/04/2020 0755 by Zachery Dakins, RN Outcome: Progressing 03/04/2020 0755 by Zachery Dakins, RN Outcome: Progressing Goal: Will not experience complications related to urinary retention 03/04/2020 0755 by Zachery Dakins, RN Outcome: Progressing 03/04/2020 0755 by Zachery Dakins, RN Outcome: Progressing   Problem: Pain Managment: Goal: General experience of comfort will improve 03/04/2020 0755 by Zachery Dakins, RN Outcome: Progressing 03/04/2020 0755 by Zachery Dakins, RN Outcome: Progressing   Problem: Safety: Goal: Ability to remain free from injury will improve 03/04/2020 0755 by Zachery Dakins, RN Outcome: Progressing 03/04/2020 0755 by Zachery Dakins, RN Outcome: Progressing   Problem: Skin Integrity: Goal: Risk for impaired skin integrity will decrease 03/04/2020 0755  by Zachery Dakins, RN Outcome: Progressing 03/04/2020 0755 by Zachery Dakins, RN Outcome: Progressing

## 2020-03-04 NOTE — TOC Transition Note (Signed)
Transition of Care [facility]) - CM/SW Discharge Note  Patient Details  Name: [name] MRN: [id number] Date of Birth: 08-12-1950  Transition of Care [facility]) CM/SW Contact:  Ewing Schlein, LCSW Phone Number: 03/04/2020, 11:05 AM  Clinical Narrative: Patient to discharge home with [facility] services. Orders have been placed. CSW notified Bonita Quin with [facility] that patient is discharging today. TOC signing off.  Final next level of care: Home w Home Health Services Barriers to Discharge: Barriers Resolved  Patient Goals and CMS Choice Patient states their goals for this hospitalization and ongoing recovery are:: Discharge home CMS Medicare.gov Compare Post Acute Care list provided to:: Patient Represenative (must comment) Choice offered to / list presented to :  [name] Long (aunt))  Discharge Plan and Services Post Acute Care Choice: Home Health          DME Arranged: N/A DME Agency: NA HH Arranged: PT, RN HH Agency: [facility] Date HH Agency Contacted: 03/04/20 Time HH Agency Contacted: 1029 Representative spoke with at [facility] Agency: Linda  Readmission Risk Interventions Readmission Risk Prevention Plan 05/26/2019 09/20/2018 09/19/2018  Transportation Screening Complete Complete Complete  PCP or Specialist Appt within 3-5 Days Not Complete - -  HRI or Home Care Consult Complete - -  Social Work Consult for Recovery Care Planning/Counseling Complete - -  Palliative Care Screening Not Complete - -  Medication Review Oceanographer) Complete Complete Complete  PCP or Specialist appointment within 3-5 days of discharge - Complete Complete  PCP/Specialist Appt Not Complete comments - - SNF resident. Sees facility MD  HRI or Home Care Consult - Not Complete -  HRI or Home Care Consult Pt Refusal Comments - Patient is discharging to SNF for rehab -  SW Recovery Care/Counseling Consult - Complete -  Palliative Care Screening - Not Applicable -  Skilled Nursing Facility - Complete -   Some recent data might be hidden

## 2020-03-07 NOTE — Telephone Encounter (Signed)
Appt moved.  Pt will need his u/s done prior to his January appt.

## 2020-03-09 ENCOUNTER — Ambulatory Visit: Payer: Medicare Other | Admitting: Urology

## 2020-03-17 ENCOUNTER — Ambulatory Visit (INDEPENDENT_AMBULATORY_CARE_PROVIDER_SITE_OTHER): Payer: Medicare Other

## 2020-03-17 ENCOUNTER — Other Ambulatory Visit: Payer: Self-pay

## 2020-03-17 DIAGNOSIS — N200 Calculus of kidney: Secondary | ICD-10-CM | POA: Diagnosis not present

## 2020-03-17 DIAGNOSIS — R339 Retention of urine, unspecified: Secondary | ICD-10-CM

## 2020-03-17 LAB — BLADDER SCAN AMB NON-IMAGING
Cath Procedure Report: 1500
Scan Result: >1057

## 2020-03-17 NOTE — Progress Notes (Signed)
Pt here today due to caregiver unable to I&O pt since yesterday morning   Bladder Scan Patient cannot void: >1057 ml Performed By: Bridgette Habermann, lpn  Pt is prepped for and in and out catherization. Patient was cleaned and prepped in a sterle fashion with betadine. A 14 fr catheter foley was inserted without difficulty. Urine return was noted @ 1500 ml.  Performed by [facility], lpn   Urine sent for ua &culture

## 2020-03-18 LAB — URINALYSIS, ROUTINE W REFLEX MICROSCOPIC
Bilirubin, UA: NEGATIVE
Glucose, UA: NEGATIVE
Ketones, UA: NEGATIVE
Nitrite, UA: NEGATIVE
Specific Gravity, UA: 1.008 (ref 1.005–1.030)
Urobilinogen, Ur: 0.2 mg/dL (ref 0.2–1.0)
pH, UA: 7.5 (ref 5.0–7.5)

## 2020-03-18 LAB — MICROSCOPIC EXAMINATION
Casts: NONE SEEN /lpf
Epithelial Cells (non renal): NONE SEEN /hpf (ref 0–10)
WBC, UA: >30 /hpf — AB (ref 0–5)

## 2020-03-24 LAB — URINE CULTURE

## 2020-03-29 ENCOUNTER — Telehealth: Payer: Self-pay

## 2020-03-29 DIAGNOSIS — R339 Retention of urine, unspecified: Secondary | ICD-10-CM

## 2020-03-29 MED ORDER — NITROFURANTOIN MONOHYD MACRO 100 MG PO CAPS: 100.0000 mg | ORAL_CAPSULE | Freq: Two times a day (BID) | ORAL | 0 refills | Status: DC

## 2020-03-29 NOTE — Telephone Encounter (Signed)
Caregiver called and made aware. 

## 2020-03-29 NOTE — Telephone Encounter (Signed)
-----   Message from Malen Gauze, MD sent at 03/28/2020  8:15 AM EST ----- Macrobid 100mg  BID for 7 days ----- Message ----- From: , LPN Sent: Gustavus Messing   8:21 AM EST To: 60/07/5995, MD  Please review. See 03/17/20 encounter

## 2020-04-01 ENCOUNTER — Other Ambulatory Visit: Payer: Self-pay

## 2020-04-01 ENCOUNTER — Ambulatory Visit (INDEPENDENT_AMBULATORY_CARE_PROVIDER_SITE_OTHER): Payer: Medicare Other

## 2020-04-01 DIAGNOSIS — R339 Retention of urine, unspecified: Secondary | ICD-10-CM | POA: Diagnosis not present

## 2020-04-01 NOTE — Progress Notes (Signed)
Patient aunt brings pt today with reports of difficulty with inserting pt cath at home for in and out.  Inserted 16coude without difficulty. 800cc of yellow urine return noted. Per aunt when she attempts at home the " tubing curls up"   Samples of 82f coude given to caretaker to try that size at home. Care taker voiced understanding.

## 2020-04-05 ENCOUNTER — Encounter (INDEPENDENT_AMBULATORY_CARE_PROVIDER_SITE_OTHER): Payer: Self-pay | Admitting: Internal Medicine

## 2020-04-05 ENCOUNTER — Ambulatory Visit (INDEPENDENT_AMBULATORY_CARE_PROVIDER_SITE_OTHER): Payer: Medicare Other | Admitting: Internal Medicine

## 2020-04-05 ENCOUNTER — Other Ambulatory Visit: Payer: Self-pay

## 2020-04-05 VITALS — BP 117/73 | HR 83 | Temp 97.7°F | Ht 60.0 in | Wt 142.0 lb

## 2020-04-05 DIAGNOSIS — K5909 Other constipation: Secondary | ICD-10-CM

## 2020-04-05 MED ORDER — SIMETHICONE 40 MG/0.6ML PO SUSP: 80.0000 mg | Freq: Four times a day (QID) | ORAL | 1 refills | Status: AC | PRN

## 2020-04-05 NOTE — Patient Instructions (Addendum)
Please make sure he has at least one bowel movement daily or every other day. If suppository doen not work can use fleet enema. Please encourage to sit on commode for few minutes every night before he goes to bed.

## 2020-04-05 NOTE — Progress Notes (Signed)
Presenting complaint;  Follow for chronic constipation. History of megacolon.  Database and subjective:  Patient is 69 year old Afro-American male who is here for scheduled visit accompanied by his aunt [name] who is also care provider. Patient was last seen 6 months ago. He was admitted to [facility] on 02/28/2020 and discharged 5 days later.  He developed abdominal pain and distention and nausea and heaving.  He was diagnosed with small bowel obstruction.  GI was not consulted but he was seen by Dr. Constance Haw of surgical service who has seen him in the past as well.  NG tube was placed multiple times but he would pull it out.  He did respond to medical therapy which was confirmed on follow-up CT. [name] states that he had not had a bowel movement for 3 days before he got sick.  Now he is having an average of 2 bowel movements per day.  He can go 2 to 3 days without a bowel movement.  He does complain of epigastric pain.  His appetite is back to normal.  He has not lost any weight since his last visit.  He uses walker to ambulate.  [name] states that she encourages and pushes him to walk some every day. Patient also has difficulty voiding and has to be catheterized twice daily. She wonders if there is any other medication would would help improve bladder emptying.  He has an appointment to see Dr. Alyson Ingles in about 3 weeks.  Current Medications: Outpatient Encounter Medications as of 04/05/2020  Medication Sig  . acetaminophen (TYLENOL) 500 MG tablet Take 1,000 mg by mouth every 6 (six) hours as needed for mild pain or headache.  Marland Kitchen aspirin EC 81 MG tablet Take 1 tablet (81 mg total) by mouth daily with breakfast.  . atorvastatin (LIPITOR) 20 MG tablet Take 20 mg by mouth at bedtime.  . bisacodyl (DULCOLAX) 10 MG suppository Place 1 suppository (10 mg total) rectally daily.  . cefdinir (OMNICEF) 300 MG capsule Take 1 capsule (300 mg total) by mouth 2 (two) times daily.  . finasteride  (PROSCAR) 5 MG tablet Take 1 tablet (5 mg total) by mouth daily.  . magnesium oxide (MAG-OX) 400 MG tablet Take 1 tablet (400 mg total) by mouth daily.  . metFORMIN (GLUCOPHAGE) 850 MG tablet Take 1 tablet (850 mg total) by mouth 2 (two) times daily with a meal. (Patient taking differently: Take 1,000 mg by mouth 2 (two) times daily with a meal.)  . Potassium Chloride ER 20 MEQ TBCR Take 20 mEq by mouth every Tuesday, Thursday, Saturday, and Sunday. 1 tab daily by mouth  . tamsulosin (FLOMAX) 0.4 MG CAPS capsule Take 1 capsule (0.4 mg total) by mouth every evening.  . [DISCONTINUED] simethicone (MYLICON) 40 EX/5.2WU drops Take 1.2 mLs (80 mg total) by mouth 4 (four) times daily.  . simethicone (MYLICON) 40 XL/2.4MW drops Take 1.2 mLs (80 mg total) by mouth 4 (four) times daily as needed for flatulence.  . [DISCONTINUED] nitrofurantoin, macrocrystal-monohydrate, (MACROBID) 100 MG capsule Take 1 capsule (100 mg total) by mouth every 12 (twelve) hours. (Patient not taking: Reported on 04/05/2020)   No facility-administered encounter medications on file as of 04/05/2020.     Objective: Blood pressure 117/73, pulse 83, temperature 97.7 F (36.5 C), temperature source Oral, height 5' (1.524 m), weight 142 lb (64.4 kg). Patient is alert and in no acute distress. He is wearing a mask. He responds by nodding his head and using his hand to let me  know where his pain is. Conjunctiva is pink. Sclera is nonicteric Oropharyngeal mucosa is normal. No neck masses or thyromegaly noted. Cardiac exam with regular rhythm normal S1 and S2. No murmur or gallop noted. Lungs are clear to auscultation. Abdomen is distended but not tense.  Bowel sounds are present.  On palpation abdomen is soft with mild midepigastric tenderness.  Percussion note is tympanic.  No organomegaly or masses. No LE edema or clubbing noted.  Labs/studies Results:   CBC Latest Ref Rng & Units 03/04/2020 03/03/2020 03/02/2020  WBC 4.0 -  10.5 K/uL 6.3 5.3 5.5  Hemoglobin 13.0 - 17.0 g/dL 11.2(L) 11.5(L) 10.4(L)  Hematocrit 39.0 - 52.0 % 37.2(L) 39.2 36.1(L)  Platelets 150 - 400 K/uL 197 185 181    CMP Latest Ref Rng & Units 03/04/2020 03/03/2020 02/29/2020  Glucose 70 - 99 mg/dL 140(H) 117(H) 165(H)  BUN 8 - 23 mg/dL 8 8 30(H)  Creatinine 0.61 - 1.24 mg/dL 0.69 0.67 0.94  Sodium 135 - 145 mmol/L 135 137 139  Potassium 3.5 - 5.1 mmol/L 3.2(L) 3.5 4.2  Chloride 98 - 111 mmol/L 103 104 98  CO2 22 - 32 mmol/L $RemoveB'26 25 26  'XZMCkSUU$ Calcium 8.9 - 10.3 mg/dL 8.3(L) 8.5(L) 9.3  Total Protein 6.5 - 8.1 g/dL 5.8(L) - -  Total Bilirubin 0.3 - 1.2 mg/dL 0.5 - -  Alkaline Phos 38 - 126 U/L 53 - -  AST 15 - 41 U/L 12(L) - -  ALT 0 - 44 U/L 12 - -    Hepatic Function Latest Ref Rng & Units 03/04/2020 02/28/2020 07/06/2019  Total Protein 6.5 - 8.1 g/dL 5.8(L) 9.4(H) 6.1(L)  Albumin 3.5 - 5.0 g/dL 2.7(L) 4.5 2.6(L)  AST 15 - 41 U/L 12(L) 22 9(L)  ALT 0 - 44 U/L $Remo'12 17 6  'EFbGR$ Alk Phosphatase 38 - 126 U/L 53 84 58  Total Bilirubin 0.3 - 1.2 mg/dL 0.5 0.6 0.3  Bilirubin, Direct 0.0 - 0.2 mg/dL - - -    Abdominal pelvic CT from 02/28/2020 and study from 03/02/2020 reviewed. For study showed marked dilation of small bowel which is fluid-filled and second study showed resolution of dilation and most of the gas in the colon.  Assessment:  #1.  Chronic constipation and history of megacolon.  He is doing fairly well with Linzess and as needed Dulcolax suppository.  I believe prucalopride by virtue of some effect on small as well as large bowel would have been optimal agent for him but it was not covered.  Will continue with present therapy.  Ideally he should have a bowel movement daily or every other day and should not go more than 1 day without a bowel movement.  If he skips 1 day and suppository does not work then next I would be fleets enema.  #2.  Mild anemia appears to be due to chronic disease.  Plan:  Continue Linzess/linaclotide at current dose  of 145 mcg p.o. every morning. Use Dulcolax suppository on as-needed basis.  If suppository fails can use fleets enemas. I recommended the patient should sit on the commode at least 3 times a day including the night before he goes to bed. Unless his condition changes he will return for office visit in 6 months.

## 2020-04-22 ENCOUNTER — Ambulatory Visit: Payer: Medicare Other | Admitting: Urology

## 2020-05-03 ENCOUNTER — Ambulatory Visit (HOSPITAL_COMMUNITY): Admission: RE | Admit: 2020-05-03 | Payer: Medicare Other | Source: Ambulatory Visit

## 2020-05-05 ENCOUNTER — Ambulatory Visit: Payer: Medicare Other | Admitting: Urology

## 2020-05-18 ENCOUNTER — Emergency Department (HOSPITAL_COMMUNITY)
Admission: RE | Admit: 2020-05-18 | Discharge: 2020-05-18 | Disposition: A | Discharge status: Home / Self Care | Payer: Medicare Other | Source: Ambulatory Visit | Attending: Urology | Admitting: Urology

## 2020-05-18 ENCOUNTER — Encounter (HOSPITAL_COMMUNITY): Payer: Self-pay | Admitting: Emergency Medicine

## 2020-05-18 ENCOUNTER — Emergency Department (HOSPITAL_COMMUNITY)
Admission: EM | Admit: 2020-05-18 | Discharge: 2020-05-18 | Disposition: A | Discharge status: Home / Self Care | Payer: Medicare Other | Attending: Emergency Medicine | Admitting: Emergency Medicine

## 2020-05-18 ENCOUNTER — Other Ambulatory Visit: Payer: Self-pay

## 2020-05-18 DIAGNOSIS — Z7984 Long term (current) use of oral hypoglycemic drugs: Secondary | ICD-10-CM | POA: Insufficient documentation

## 2020-05-18 DIAGNOSIS — R1030 Lower abdominal pain, unspecified: Secondary | ICD-10-CM | POA: Diagnosis not present

## 2020-05-18 DIAGNOSIS — I10 Essential (primary) hypertension: Secondary | ICD-10-CM | POA: Insufficient documentation

## 2020-05-18 DIAGNOSIS — N390 Urinary tract infection, site not specified: Secondary | ICD-10-CM | POA: Diagnosis not present

## 2020-05-18 DIAGNOSIS — Z7982 Long term (current) use of aspirin: Secondary | ICD-10-CM | POA: Diagnosis not present

## 2020-05-18 DIAGNOSIS — R339 Retention of urine, unspecified: Secondary | ICD-10-CM | POA: Diagnosis present

## 2020-05-18 DIAGNOSIS — E119 Type 2 diabetes mellitus without complications: Secondary | ICD-10-CM | POA: Diagnosis not present

## 2020-05-18 DIAGNOSIS — Z8616 Personal history of COVID-19: Secondary | ICD-10-CM | POA: Diagnosis not present

## 2020-05-18 DIAGNOSIS — Z79899 Other long term (current) drug therapy: Secondary | ICD-10-CM | POA: Insufficient documentation

## 2020-05-18 DIAGNOSIS — N200 Calculus of kidney: Secondary | ICD-10-CM

## 2020-05-18 LAB — URINALYSIS, ROUTINE W REFLEX MICROSCOPIC
Bilirubin Urine: NEGATIVE
Glucose, UA: NEGATIVE mg/dL
Ketones, ur: NEGATIVE mg/dL
Nitrite: NEGATIVE
Protein, ur: 100 mg/dL — AB
RBC / HPF: >50 RBC/hpf — ABNORMAL HIGH (ref 0–5)
Specific Gravity, Urine: 1.012 (ref 1.005–1.030)
WBC, UA: >50 WBC/hpf — ABNORMAL HIGH (ref 0–5)
pH: 5 (ref 5.0–8.0)

## 2020-05-18 MED ORDER — CEPHALEXIN 500 MG PO CAPS
500.0000 mg | ORAL_CAPSULE | Freq: Once | ORAL | Status: AC
  Administered 2020-05-18: 500 mg via ORAL
  Filled 2020-05-18: qty 1

## 2020-05-18 MED ORDER — AMOXICILLIN-POT CLAVULANATE 500-125 MG PO TABS: 1.0000 | ORAL_TABLET | Freq: Three times a day (TID) | ORAL | 0 refills | Status: DC

## 2020-05-18 NOTE — ED Triage Notes (Signed)
Pt was having his wife cath him and noticed some blood in his urine and met some resistance. HX of enlarged prostate. C/o of lower abdominal pain.

## 2020-05-18 NOTE — ED Provider Notes (Signed)
[facility] EMERGENCY DEPARTMENT Provider Note   CSN: [id number] Arrival date & time: 05/18/20  1554     History Chief Complaint  Patient presents with  . Hematuria    [name] is a 70 y.o. male.  HPI      [name] is a 70 y.o. male with past medical history of type 2 diabetes, hypertension, BPH and urinary retention who is nonverbal at baseline.  Family member (aunt and caregiver) performs urinary catheterizations.  She attempted to catheterize him earlier today and noticed blood in his urine and having difficulty advancing the catheter.  She states the patient was having some lower abdominal discomfort and she felt she was unsuccessful to empty his bladder.  She states this has happened before and that he sometimes passes clots of blood.  She denies any recent illness, fever, chills, and vomiting.  Patient has upcoming appointment with urology per his caregiver.    Past Medical History:  Diagnosis Date  . Diabetes mellitus without complication (HCC)   . History of gout   . History of kidney stones   . Hypertension   . Mentally challenged   . Urinary retention     Patient Active Problem List   Diagnosis Date Noted  . Goals of care, counseling/discussion   . Encounter for hospice care discussion   . COVID-19 virus infection 02/28/2020  . Palliative care by specialist   . Pneumoperitoneum 07/04/2019  . Intra-abdominal free air of unknown etiology 07/04/2019  . Cystitis   . Chronic cholecystitis 05/21/2019  . Chronic constipation 03/31/2019  . Cholecystostomy care (HCC) 11/04/2018  . Acute lower UTI 09/18/2018  . Abdominal distention 09/18/2018  . Hypomagnesemia 09/09/2018  . Protein-calorie malnutrition, severe (HCC) 09/09/2018  . Acute blood loss anemia 09/09/2018  . Gangrenous cholecystitis 09/05/2018  . Volvulus (HCC) 09/03/2018  . Ileus (HCC)   . Pneumatosis intestinalis   . Gall bladder disease 08/25/2018  . Small bowel ischemia (HCC)   .  Acute gangrenous cholecystitis 05/24/2018  . SBO (small bowel obstruction) (HCC) 05/24/2018  . Abdominal distension   . Pressure injury of skin 04/09/2018  . C. difficile diarrhea 04/08/2018  . Bilateral hydronephrosis 04/08/2018  . Pneumoperitoneum of unknown etiology   . Diarrhea 04/07/2018  . Nonverbal 04/07/2018  . Hydronephrosis with urinary obstruction due to ureteral calculus 06/20/2016  . Sepsis secondary to UTI (HCC) 06/20/2016  . Fever 06/19/2016  . Generalized weakness 06/19/2016  . Diabetes mellitus type 2, noninsulin dependent (HCC) 06/19/2016  . Severe sepsis with septic shock (HCC) 06/19/2016  . UTI (urinary tract infection) 06/19/2016  . Hematuria 06/19/2016  . AKI (acute kidney injury) (HCC) 06/19/2016  . Hyponatremia 06/19/2016  . Hypokalemia 06/19/2016  . BPH (benign prostatic hyperplasia) 06/19/2016  . Megacolon 05/07/2014  . Urinary retention 05/07/2014  . Intellectual disability 05/07/2014  . Hyperlipidemia 05/07/2014  . HTN (hypertension) 05/07/2014  . Renal calculus 01/14/2012    Past Surgical History:  Procedure Laterality Date  . CHOLECYSTECTOMY N/A 05/25/2019   Procedure: OPEN CHOLECYSTECTOMY;  Surgeon: [name], MD;  Location: [facility];  Service: General;  Laterality: N/A;  . CYSTOSCOPY W/ URETERAL STENT PLACEMENT Bilateral 06/20/2016   Procedure: CYSTOSCOPY WITH RETROGRADE PYELOGRAM/URETERAL STENT PLACEMENT;  Surgeon: Malen Gauze, MD;  Location: [facility];  Service: Urology;  Laterality: Bilateral;  . CYSTOSCOPY WITH RETROGRADE PYELOGRAM, URETEROSCOPY AND STENT PLACEMENT Bilateral 08/20/2016   Procedure: CYSTOSCOPY WITH BILATERAL RETROGRADE PYELOGRAM, BILATERAL URETEROSCOPY,  LASER LITHOTRIPSY OF RIGHT URETERAL CALCULI,  STONE BASKET EXTRACTION LEFT URETERAL CALCULI  AND BILATERAL URETERAL STENT EXCHANGE;  Surgeon: Malen Gauze, MD;  Location: [facility];  Service: Urology;  Laterality: Bilateral;  . CYSTOSCOPY WITH RETROGRADE PYELOGRAM,  URETEROSCOPY AND STENT PLACEMENT Left 01/14/2019   Procedure: CYSTOSCOPY WITH LEFT RETROGRADE PYELOGRAM, LEFT URETEROSCOPY AND LEFT URETERAL STENT PLACEMENT;  Surgeon: Malen Gauze, MD;  Location: [facility];  Service: Urology;  Laterality: Left;  . CYSTOSCOPY WITH URETHRAL DILATATION  01/14/2019   Procedure: CYSTOSCOPY WITH URETHRAL DILATATION;  Surgeon: Malen Gauze, MD;  Location: [facility];  Service: Urology;;  . HOLMIUM LASER APPLICATION Bilateral 08/20/2016   Procedure: HOLMIUM LASER APPLICATION;  Surgeon: Malen Gauze, MD;  Location: [facility];  Service: Urology;  Laterality: Bilateral;  . HOLMIUM LASER APPLICATION Left 01/14/2019   Procedure: HOLMIUM LASER APPLICATION;  Surgeon: Malen Gauze, MD;  Location: [facility];  Service: Urology;  Laterality: Left;  . IR CATHETER TUBE CHANGE  07/04/2018  . IR EXCHANGE BILIARY DRAIN  06/09/2018  . IR EXCHANGE BILIARY DRAIN  10/20/2018  . IR EXCHANGE BILIARY DRAIN  01/12/2019  . IR EXCHANGE BILIARY DRAIN  04/07/2019  . IR PERC CHOLECYSTOSTOMY  05/24/2018  . LAPAROTOMY N/A 08/25/2018   Procedure: EXPLORATORY LAPAROTOMY reduction of volvulus;  Surgeon: [name], MD;  Location: [facility];  Service: General;  Laterality: N/A;  . LAPAROTOMY N/A 05/25/2019   Procedure: EXPLORATORY LAPAROTOMY;  Surgeon: [name], MD;  Location: [facility];  Service: General;  Laterality: N/A;       Family History  Problem Relation Age of Onset  . Gallbladder disease Other     Social History   Tobacco Use  . Smoking status: Never Smoker  . Smokeless tobacco: Never Used  Vaping Use  . Vaping Use: Never used  Substance Use Topics  . Alcohol use: No  . Drug use: No    Home Medications Prior to Admission medications   Medication Sig Start Date End Date Taking? Authorizing Provider  acetaminophen (TYLENOL) 500 MG tablet Take 1,000 mg by mouth every 6 (six) hours as needed for mild pain or headache.    [provider]  aspirin EC 81 MG  tablet Take 1 tablet (81 mg total) by mouth daily with breakfast. 05/29/19   Mariea Clonts, Courage, MD  atorvastatin (LIPITOR) 20 MG tablet Take 20 mg by mouth at bedtime. 06/09/19   [provider]  bisacodyl (DULCOLAX) 10 MG suppository Place 1 suppository (10 mg total) rectally daily. 03/03/20   Catarina Hartshorn, MD  cefdinir (OMNICEF) 300 MG capsule Take 1 capsule (300 mg total) by mouth 2 (two) times daily. 03/04/20   Catarina Hartshorn, MD  finasteride (PROSCAR) 5 MG tablet Take 1 tablet (5 mg total) by mouth daily. 05/29/19   Shon Hale, MD  linaclotide (LINZESS) 145 MCG CAPS capsule Take 145 mcg by mouth daily before breakfast.    [provider]  magnesium oxide (MAG-OX) 400 MG tablet Take 1 tablet (400 mg total) by mouth daily. 10/23/18   Sharee Holster, NP  metFORMIN (GLUCOPHAGE) 850 MG tablet Take 1 tablet (850 mg total) by mouth 2 (two) times daily with a meal. Patient taking differently: Take 1,000 mg by mouth 2 (two) times daily with a meal. 05/29/19 05/28/20  Shon Hale, MD  Potassium Chloride ER 20 MEQ TBCR Take 20 mEq by mouth every Tuesday, Thursday, Saturday, and Sunday. 1 tab daily by mouth 05/30/19   Shon Hale, MD  simethicone (MYLICON) 40 MG/0.6ML  drops Take 1.2 mLs (80 mg total) by mouth 4 (four) times daily as needed for flatulence. 04/05/20   Malissa Hippo, MD  tamsulosin (FLOMAX) 0.4 MG CAPS capsule Take 1 capsule (0.4 mg total) by mouth every evening. 05/29/19   Shon Hale, MD    Allergies    Patient has no known allergies.  Review of Systems   Review of Systems  Constitutional: Negative for chills, fatigue and fever.  HENT: Negative for trouble swallowing.   Respiratory: Negative for cough, shortness of breath and wheezing.   Cardiovascular: Negative for chest pain and palpitations.  Gastrointestinal: Positive for abdominal pain. Negative for blood in stool, nausea and vomiting.  Genitourinary: Positive for difficulty urinating and hematuria.  Negative for dysuria, flank pain and testicular pain.  Musculoskeletal: Negative for back pain and myalgias.  Skin: Negative for rash.  Neurological: Negative for dizziness, weakness and numbness.  Hematological: Does not bruise/bleed easily.    Physical Exam Updated Vital Signs BP 99/62   Pulse 75   Temp 97.7 F (36.5 C) (Oral)   Resp 12   Ht 5' (1.524 m)   Wt 64.4 kg   SpO2 100%   BMI 27.73 kg/m   Physical Exam Vitals and nursing note reviewed.  Constitutional:      General: He is not in acute distress.    Appearance: He is not toxic-appearing.  Cardiovascular:     Rate and Rhythm: Normal rate and regular rhythm.     Pulses: Normal pulses.  Pulmonary:     Effort: Pulmonary effort is normal.     Breath sounds: Normal breath sounds.  Abdominal:     Palpations: Abdomen is soft.     Comments: Tenderness palpation of the suprapubic area.  Abdomen appears mildly distended.  No CVA tenderness  Genitourinary:    Comments: Patient is uncircumcised.  Mild bright red blood noted  around the foreskin when retracted toward the base of the penis.  No visible fissures or blood from the urethra.  Glans penis appears mildly cyanotic.  Foreskin non edematous and easily retracts forward. No visualized foreign bodies. Musculoskeletal:        General: Normal range of motion.     Right lower leg: No edema.     Left lower leg: No edema.  Skin:    General: Skin is warm.  Neurological:     Mental Status: He is alert. Mental status is at baseline.     ED Results / Procedures / Treatments   Labs (all labs ordered are listed, but only abnormal results are displayed) Labs Reviewed  URINALYSIS, ROUTINE W REFLEX MICROSCOPIC - Abnormal; Notable for the following components:      Result Value   APPearance CLOUDY (*)    Hgb urine dipstick LARGE (*)    Protein, ur 100 (*)    Leukocytes,Ua LARGE (*)    RBC / HPF >50 (*)    WBC, UA >50 (*)    Bacteria, UA MANY (*)    All other components  within normal limits  URINE CULTURE    EKG None  Radiology Ultrasound renal complete  Result Date: 05/18/2020 CLINICAL DATA:  Nephrolithiasis EXAM: RENAL / URINARY TRACT ULTRASOUND COMPLETE COMPARISON:  None. FINDINGS: Right Kidney: Renal measurements: 10 x 5 x 6.3 cm = volume: 166.9 mL. Echogenicity within normal limits. No mass or hydronephrosis visualized. Left Kidney: Renal measurements: 10 x 5.9 x 4.6 cm = volume: 143.1 mL. Echogenicity within normal limits. No mass or hydronephrosis visualized.  Probable extrarenal pelvis. Nonobstructing calculi with the largest at the lower pole measuring 1.4 cm. Bladder: Circumferential wall thickening, which may related to underdistension or inflammation. Other: None. IMPRESSION: Left renal nonobstructing calculi measuring up to 1.4 cm at the lower pole. No hydronephrosis. Circumferential bladder wall thickening, which may related to underdistention or inflammation. Electronically Signed   By: Guadlupe Spanish M.D.   On: 05/18/2020 16:01    Procedures Procedures   Medications Ordered in ED Medications  cephALEXin (KEFLEX) capsule 500 mg (has no administration in time range)    ED Course  I have reviewed the triage vital signs and the nursing notes.  Pertinent labs & imaging results that were available during my care of the patient were reviewed by me and considered in my medical decision making (see chart for details).    MDM Rules/Calculators/A&P                         Patient here with urinary retention.  History of same.  Family member performs urine catheterizations at home and unable to advance catheter earlier today.  Last urine output 630 this morning.  Bladder scan obtained, 390 cc per scan.  In and out Foley catheter placed by nursing staff without reported resistance or blood clots or bleeding.  Greater than 600 cc dark-colored urine returned.  Patient reports feeling better.  On exam, foreskin can only be retracted partially over the  glans penis.  Glans appears cyanotic.  There is no edema of the glans or foreskin.  Foreskin easily retracts forward.  Per patient's aunt who is also her caregiver, the limited movement of the foreskin and the appearance of the glans is his baseline.  Urinalysis does show large amount of hemoglobin, large leukocytes and greater than 50 WBCs and many bacteria.  Urine culture pending.  Patient is overall well-appearing and vital signs reassuring.  No symptoms suggestive of sepsis.  I feel that patient is appropriate for discharge home.  Will start on antibiotics.  Urine culture from December grew Klebsiella, resistant to cephalosporins, will start Augmentin. encouraged to keep upcoming appointment with urology.  Final Clinical Impression(s) / ED Diagnoses Final diagnoses:  Urinary retention  Lower urinary tract infectious disease    Rx / DC Orders ED Discharge Orders    None       Pauline Aus, PA-C 05/18/20 1927    Vanetta Mulders, MD 05/18/20 [phone]

## 2020-05-18 NOTE — ED Notes (Signed)
Pre I&O scan greater than [name]

## 2020-05-18 NOTE — Discharge Instructions (Addendum)
His urinalysis this evening shows a urinary tract infection. He has been prescribed antibiotics for this. Is important that he take the antibiotic as directed until its finished. Keep his upcoming appointment with his urologist. Return emergency department if he develops any worsening symptoms such as fever or increasing bleeding or pain.

## 2020-05-21 LAB — URINE CULTURE

## 2020-05-30 ENCOUNTER — Other Ambulatory Visit: Payer: Self-pay

## 2020-05-30 ENCOUNTER — Encounter: Payer: Self-pay | Admitting: Urology

## 2020-05-30 ENCOUNTER — Ambulatory Visit (INDEPENDENT_AMBULATORY_CARE_PROVIDER_SITE_OTHER): Payer: Medicare Other | Admitting: Urology

## 2020-05-30 VITALS — BP 115/54 | HR 76 | Temp 97.4°F | Ht 67.0 in | Wt 224.0 lb

## 2020-05-30 DIAGNOSIS — N200 Calculus of kidney: Secondary | ICD-10-CM | POA: Diagnosis not present

## 2020-05-30 DIAGNOSIS — R339 Retention of urine, unspecified: Secondary | ICD-10-CM | POA: Diagnosis not present

## 2020-05-30 MED ORDER — TAMSULOSIN HCL 0.4 MG PO CAPS: 0.4000 mg | ORAL_CAPSULE | Freq: Two times a day (BID) | ORAL | 3 refills | Status: AC

## 2020-05-30 MED ORDER — FINASTERIDE 5 MG PO TABS: 5.0000 mg | ORAL_TABLET | Freq: Every day | ORAL | 3 refills | Status: AC

## 2020-05-30 MED ORDER — NITROFURANTOIN MACROCRYSTAL 50 MG PO CAPS: 50.0000 mg | ORAL_CAPSULE | Freq: Every day | ORAL | 11 refills | Status: AC

## 2020-05-30 NOTE — Progress Notes (Signed)
Urological Symptom Review  Patient is experiencing the following symptoms: Hard to postpone urination Trouble starting stream Urinary tract infection Injury to kidneys/bladder  Kidney stones  Review of Systems  Gastrointestinal (upper)  : Negative for upper GI symptoms  Gastrointestinal (lower) : Constipation  Constitutional : Negative for symptoms  Skin: Negative for skin symptoms  Eyes: Negative for eye symptoms  Ear/Nose/Throat : Negative for Ear/Nose/Throat symptoms  Hematologic/Lymphatic: Negative for Hematologic/Lymphatic symptoms  Cardiovascular : Negative for cardiovascular symptoms  Respiratory : Negative for respiratory symptoms  Endocrine: Negative for endocrine symptoms  Musculoskeletal: Back pain  Neurological: Negative for neurological symptoms  Psychologic: Negative for psychiatric symptoms

## 2020-05-30 NOTE — Patient Instructions (Signed)

## 2020-05-30 NOTE — Progress Notes (Signed)
05/30/2020 3:45 PM   [name] 04/14/1951 [id number]  Referring provider: Pearson Grippe, MD [address] [place],  [place] [address]  followup nephrolithiasis and recurrent UTI  HPI: [name] is a 69yo here for followup for nephrolithiasis and UTI. Renal US from 05/18/2020 showed stable bilateral renal calculi and no hydronephrosis. No denies any flank pain. No stone events since last visit. Siunce last visit he has been treated 4 times for a UTI. He is currently on augmentin for UTI. Cultures have been sensitive to macrobid. He denies any worsening LUTS. Urine is foul smelling today and cloudy. NO gross hematuria.   PMH: Past Medical History:  Diagnosis Date  . Diabetes mellitus without complication (HCC)   . History of gout   . History of kidney stones   . Hypertension   . Mentally challenged   . Urinary retention     Surgical History: Past Surgical History:  Procedure Laterality Date  . CHOLECYSTECTOMY N/A 05/25/2019   Procedure: OPEN CHOLECYSTECTOMY;  Surgeon: [name], MD;  Location: [facility];  Service: General;  Laterality: N/A;  . CYSTOSCOPY W/ URETERAL STENT PLACEMENT Bilateral 06/20/2016   Procedure: CYSTOSCOPY WITH RETROGRADE PYELOGRAM/URETERAL STENT PLACEMENT;  Surgeon: Malen Gauze, MD;  Location: [facility];  Service: Urology;  Laterality: Bilateral;  . CYSTOSCOPY WITH RETROGRADE PYELOGRAM, URETEROSCOPY AND STENT PLACEMENT Bilateral 08/20/2016   Procedure: CYSTOSCOPY WITH BILATERAL RETROGRADE PYELOGRAM, BILATERAL URETEROSCOPY,  LASER LITHOTRIPSY OF RIGHT URETERAL CALCULI, STONE BASKET EXTRACTION LEFT URETERAL CALCULI  AND BILATERAL URETERAL STENT EXCHANGE;  Surgeon: Malen Gauze, MD;  Location: [facility];  Service: Urology;  Laterality: Bilateral;  . CYSTOSCOPY WITH RETROGRADE PYELOGRAM, URETEROSCOPY AND STENT PLACEMENT Left 01/14/2019   Procedure: CYSTOSCOPY WITH LEFT RETROGRADE PYELOGRAM, LEFT URETEROSCOPY AND LEFT URETERAL STENT PLACEMENT;   Surgeon: Malen Gauze, MD;  Location: [facility];  Service: Urology;  Laterality: Left;  . CYSTOSCOPY WITH URETHRAL DILATATION  01/14/2019   Procedure: CYSTOSCOPY WITH URETHRAL DILATATION;  Surgeon: Malen Gauze, MD;  Location: [facility];  Service: Urology;;  . HOLMIUM LASER APPLICATION Bilateral 08/20/2016   Procedure: HOLMIUM LASER APPLICATION;  Surgeon: Malen Gauze, MD;  Location: [facility];  Service: Urology;  Laterality: Bilateral;  . HOLMIUM LASER APPLICATION Left 01/14/2019   Procedure: HOLMIUM LASER APPLICATION;  Surgeon: Malen Gauze, MD;  Location: [facility];  Service: Urology;  Laterality: Left;  . IR CATHETER TUBE CHANGE  07/04/2018  . IR EXCHANGE BILIARY DRAIN  06/09/2018  . IR EXCHANGE BILIARY DRAIN  10/20/2018  . IR EXCHANGE BILIARY DRAIN  01/12/2019  . IR EXCHANGE BILIARY DRAIN  04/07/2019  . IR PERC CHOLECYSTOSTOMY  05/24/2018  . LAPAROTOMY N/A 08/25/2018   Procedure: EXPLORATORY LAPAROTOMY reduction of volvulus;  Surgeon: [name], MD;  Location: [facility];  Service: General;  Laterality: N/A;  . LAPAROTOMY N/A 05/25/2019   Procedure: EXPLORATORY LAPAROTOMY;  Surgeon: [name], MD;  Location: [facility];  Service: General;  Laterality: N/A;    Home Medications:  Allergies as of 05/30/2020   No Known Allergies     Medication List       Accurate as of May 30, 2020  3:45 PM. If you have any questions, ask your nurse or doctor.        acetaminophen 500 MG tablet Commonly known as: TYLENOL Take 1,000 mg by mouth every 6 (six) hours as needed for mild pain or headache.   amoxicillin-clavulanate 500-125 MG tablet Commonly known as:  Augmentin Take 1 tablet (500 mg total) by mouth 3 (three) times daily.   aspirin EC 81 MG tablet Take 1 tablet (81 mg total) by mouth daily with breakfast.   atorvastatin 20 MG tablet Commonly known as: LIPITOR Take 20 mg by mouth at bedtime.   bisacodyl 10 MG suppository Commonly known as: DULCOLAX Place 1  suppository (10 mg total) rectally daily.   FeroSul 325 (65 FE) MG tablet Generic drug: ferrous sulfate Take 325 mg by mouth daily.   finasteride 5 MG tablet Commonly known as: PROSCAR Take 1 tablet (5 mg total) by mouth daily.   linaclotide 145 MCG Caps capsule Commonly known as: LINZESS Take 145 mcg by mouth daily before breakfast.   magnesium oxide 400 (241.3 Mg) MG tablet Commonly known as: MAG-OX Take 1 tablet by mouth daily.   magnesium oxide 400 MG tablet Commonly known as: MAG-OX Take 1 tablet (400 mg total) by mouth daily.   metFORMIN 850 MG tablet Commonly known as: Glucophage Take 1 tablet (850 mg total) by mouth 2 (two) times daily with a meal. What changed: how much to take   metFORMIN 1000 MG tablet Commonly known as: GLUCOPHAGE Take 1,000 mg by mouth 2 (two) times daily. What changed: Another medication with the same name was changed. Make sure you understand how and when to take each.   Potassium Chloride ER 20 MEQ Tbcr Take 20 mEq by mouth every Tuesday, Thursday, Saturday, and Sunday. 1 tab daily by mouth   simethicone 40 MG/0.6ML drops Commonly known as: MYLICON Take 1.2 mLs (80 mg total) by mouth 4 (four) times daily as needed for flatulence.   tamsulosin 0.4 MG Caps capsule Commonly known as: FLOMAX Take 1 capsule (0.4 mg total) by mouth every evening.       Allergies: No Known Allergies  Family History: Family History  Problem Relation Age of Onset  . Gallbladder disease Other     Social History:  reports that he has never smoked. He has never used smokeless tobacco. He reports that he does not drink alcohol and does not use drugs.  ROS: All other review of systems were reviewed and are negative except what is noted above in HPI  Physical Exam: BP (!) 115/54   Pulse 76   Temp (!) 97.4 F (36.3 C)   Ht 5\' 7"  (1.702 m)   Wt 224 lb (101.6 kg)   BMI 35.08 kg/m   Constitutional:  Alert and oriented, No acute distress. HEENT: [place] AT,  moist mucus membranes.  Trachea midline, no masses. Cardiovascular: No clubbing, cyanosis, or edema. Respiratory: Normal respiratory effort, no increased work of breathing. GI: Abdomen is soft, nontender, nondistended, no abdominal masses GU: No CVA tenderness.  Lymph: No cervical or inguinal lymphadenopathy. Skin: No rashes, bruises or suspicious lesions. Neurologic: Grossly intact, no focal deficits, moving all 4 extremities. Psychiatric: Normal mood and affect.  Laboratory Data: Lab Results  Component Value Date   WBC 6.3 03/04/2020   HGB 11.2 (L) 03/04/2020   HCT 37.2 (L) 03/04/2020   MCV 86.1 03/04/2020   PLT 197 03/04/2020    Lab Results  Component Value Date   CREATININE 0.69 03/04/2020    Lab Results  Component Value Date   PSA 0.67 05/07/2014    No results found for: TESTOSTERONE  Lab Results  Component Value Date   HGBA1C 7.4 (H) 05/23/2019    Urinalysis    Component Value Date/Time   COLORURINE YELLOW 05/18/2020 1659   APPEARANCEUR  CLOUDY (A) 05/18/2020 1659   APPEARANCEUR Cloudy (A) 03/17/2020 1621   LABSPEC 1.012 05/18/2020 1659   PHURINE 5.0 05/18/2020 1659   GLUCOSEU NEGATIVE 05/18/2020 1659   HGBUR LARGE (A) 05/18/2020 1659   BILIRUBINUR NEGATIVE 05/18/2020 1659   BILIRUBINUR Negative 03/17/2020 1621   KETONESUR NEGATIVE 05/18/2020 1659   PROTEINUR 100 (A) 05/18/2020 1659   UROBILINOGEN 1.0 07/20/2014 1718   NITRITE NEGATIVE 05/18/2020 1659   LEUKOCYTESUR LARGE (A) 05/18/2020 1659    Lab Results  Component Value Date   LABMICR See below: 03/17/2020   WBCUA >30 (A) 03/17/2020   LABEPIT None seen 03/17/2020   BACTERIA MANY (A) 05/18/2020    Pertinent Imaging: Renal US 05/18/2020: Images reviewed and discussed with the patient Results for orders placed during the hospital encounter of 02/28/20  DG Abd 1 View  Narrative CLINICAL DATA:  Small-bowel obstruction.  Abdominal pain.  EXAM: ABDOMEN - 1 VIEW  COMPARISON:   11/11/2019  FINDINGS: Two supine views. Again demonstrated are gas-filled loops of small bowel, similar. Normal caliber colon. No free intraperitoneal air given limitations of supine exam. Radiation seeds in the prostate.  IMPRESSION: Similar appearance of diffuse gaseous distension of small bowel loops which is likely related to distal small bowel obstruction, given CT appearance.   Electronically Signed By: Jeronimo Greaves M.D. On: 03/01/2020 14:07  No results found for this or any previous visit.  No results found for this or any previous visit.  No results found for this or any previous visit.  Results for orders placed during the hospital encounter of 05/18/20  Ultrasound renal complete  Narrative CLINICAL DATA:  Nephrolithiasis  EXAM: RENAL / URINARY TRACT ULTRASOUND COMPLETE  COMPARISON:  None.  FINDINGS: Right Kidney:  Renal measurements: 10 x 5 x 6.3 cm = volume: 166.9 mL. Echogenicity within normal limits. No mass or hydronephrosis visualized.  Left Kidney:  Renal measurements: 10 x 5.9 x 4.6 cm = volume: 143.1 mL. Echogenicity within normal limits. No mass or hydronephrosis visualized. Probable extrarenal pelvis. Nonobstructing calculi with the largest at the lower pole measuring 1.4 cm.  Bladder:  Circumferential wall thickening, which may related to underdistension or inflammation.  Other:  None.  IMPRESSION: Left renal nonobstructing calculi measuring up to 1.4 cm at the lower pole.  No hydronephrosis.  Circumferential bladder wall thickening, which may related to underdistention or inflammation.   Electronically Signed By: Guadlupe Spanish M.D. On: 05/18/2020 16:01  No results found for this or any previous visit.  No results found for this or any previous visit.  No results found for this or any previous visit.   Assessment & Plan:    1. BPH with LUTS, Urine retention Continue flomax and finasteride  2. Renal calculus -RTC  6 months with renal US - Urinalysis, Routine w reflex microscopic  3. Recurrent UTI -We will start macrobid 50mg  qhs for prophylaxis   No follow-ups on file.  , MD  [facility] [place]

## 2020-06-15 ENCOUNTER — Encounter (HOSPITAL_COMMUNITY): Payer: Self-pay | Admitting: Emergency Medicine

## 2020-06-15 ENCOUNTER — Emergency Department (HOSPITAL_COMMUNITY): Payer: Medicare Other

## 2020-06-15 ENCOUNTER — Inpatient Hospital Stay (HOSPITAL_COMMUNITY)
Admission: EM | Admit: 2020-06-15 | Discharge: 2020-06-25 | DRG: 871 | Disposition: A | Discharge status: Home Health | Payer: Medicare Other | Attending: Internal Medicine | Admitting: Internal Medicine

## 2020-06-15 ENCOUNTER — Other Ambulatory Visit: Payer: Self-pay

## 2020-06-15 DIAGNOSIS — B961 Klebsiella pneumoniae [K. pneumoniae] as the cause of diseases classified elsewhere: Secondary | ICD-10-CM | POA: Diagnosis present

## 2020-06-15 DIAGNOSIS — B9620 Unspecified Escherichia coli [E. coli] as the cause of diseases classified elsewhere: Secondary | ICD-10-CM | POA: Diagnosis not present

## 2020-06-15 DIAGNOSIS — Z8616 Personal history of COVID-19: Secondary | ICD-10-CM

## 2020-06-15 DIAGNOSIS — N390 Urinary tract infection, site not specified: Secondary | ICD-10-CM

## 2020-06-15 DIAGNOSIS — N401 Enlarged prostate with lower urinary tract symptoms: Secondary | ICD-10-CM | POA: Diagnosis present

## 2020-06-15 DIAGNOSIS — I10 Essential (primary) hypertension: Secondary | ICD-10-CM | POA: Diagnosis present

## 2020-06-15 DIAGNOSIS — U071 COVID-19: Secondary | ICD-10-CM

## 2020-06-15 DIAGNOSIS — Z1612 Extended spectrum beta lactamase (ESBL) resistance: Secondary | ICD-10-CM | POA: Diagnosis present

## 2020-06-15 DIAGNOSIS — A4151 Sepsis due to Escherichia coli [E. coli]: Principal | ICD-10-CM | POA: Diagnosis present

## 2020-06-15 DIAGNOSIS — L89312 Pressure ulcer of right buttock, stage 2: Secondary | ICD-10-CM | POA: Diagnosis present

## 2020-06-15 DIAGNOSIS — K560 Paralytic ileus: Secondary | ICD-10-CM | POA: Diagnosis present

## 2020-06-15 DIAGNOSIS — R338 Other retention of urine: Secondary | ICD-10-CM | POA: Diagnosis present

## 2020-06-15 DIAGNOSIS — A0472 Enterocolitis due to Clostridium difficile, not specified as recurrent: Secondary | ICD-10-CM | POA: Diagnosis present

## 2020-06-15 DIAGNOSIS — K5900 Constipation, unspecified: Secondary | ICD-10-CM | POA: Diagnosis present

## 2020-06-15 DIAGNOSIS — Z87442 Personal history of urinary calculi: Secondary | ICD-10-CM

## 2020-06-15 DIAGNOSIS — Z79899 Other long term (current) drug therapy: Secondary | ICD-10-CM

## 2020-06-15 DIAGNOSIS — Z7982 Long term (current) use of aspirin: Secondary | ICD-10-CM | POA: Diagnosis not present

## 2020-06-15 DIAGNOSIS — F79 Unspecified intellectual disabilities: Secondary | ICD-10-CM | POA: Diagnosis present

## 2020-06-15 DIAGNOSIS — E785 Hyperlipidemia, unspecified: Secondary | ICD-10-CM | POA: Diagnosis present

## 2020-06-15 DIAGNOSIS — N138 Other obstructive and reflux uropathy: Secondary | ICD-10-CM | POA: Diagnosis not present

## 2020-06-15 DIAGNOSIS — Z66 Do not resuscitate: Secondary | ICD-10-CM | POA: Diagnosis present

## 2020-06-15 DIAGNOSIS — M109 Gout, unspecified: Secondary | ICD-10-CM | POA: Diagnosis present

## 2020-06-15 DIAGNOSIS — R140 Abdominal distension (gaseous): Secondary | ICD-10-CM

## 2020-06-15 DIAGNOSIS — A498 Other bacterial infections of unspecified site: Secondary | ICD-10-CM | POA: Diagnosis not present

## 2020-06-15 DIAGNOSIS — E119 Type 2 diabetes mellitus without complications: Secondary | ICD-10-CM | POA: Diagnosis present

## 2020-06-15 DIAGNOSIS — Z7984 Long term (current) use of oral hypoglycemic drugs: Secondary | ICD-10-CM | POA: Diagnosis not present

## 2020-06-15 DIAGNOSIS — R6510 Systemic inflammatory response syndrome (SIRS) of non-infectious origin without acute organ dysfunction: Secondary | ICD-10-CM

## 2020-06-15 DIAGNOSIS — N400 Enlarged prostate without lower urinary tract symptoms: Secondary | ICD-10-CM | POA: Diagnosis present

## 2020-06-15 DIAGNOSIS — R4701 Aphasia: Secondary | ICD-10-CM | POA: Diagnosis present

## 2020-06-15 DIAGNOSIS — E876 Hypokalemia: Secondary | ICD-10-CM | POA: Diagnosis present

## 2020-06-15 DIAGNOSIS — Z9049 Acquired absence of other specified parts of digestive tract: Secondary | ICD-10-CM | POA: Diagnosis not present

## 2020-06-15 DIAGNOSIS — Z87440 Personal history of urinary (tract) infections: Secondary | ICD-10-CM

## 2020-06-15 DIAGNOSIS — R339 Retention of urine, unspecified: Secondary | ICD-10-CM | POA: Diagnosis present

## 2020-06-15 DIAGNOSIS — R7881 Bacteremia: Secondary | ICD-10-CM | POA: Diagnosis not present

## 2020-06-15 LAB — URINALYSIS, ROUTINE W REFLEX MICROSCOPIC
Bilirubin Urine: NEGATIVE
Glucose, UA: NEGATIVE mg/dL
Ketones, ur: NEGATIVE mg/dL
Nitrite: NEGATIVE
Protein, ur: 100 mg/dL — AB
Specific Gravity, Urine: 1.014 (ref 1.005–1.030)
WBC, UA: >50 WBC/hpf — ABNORMAL HIGH (ref 0–5)
pH: 7 (ref 5.0–8.0)

## 2020-06-15 LAB — GLUCOSE, CAPILLARY
Glucose-Capillary: 127 mg/dL — ABNORMAL HIGH (ref 70–99)
Glucose-Capillary: 139 mg/dL — ABNORMAL HIGH (ref 70–99)

## 2020-06-15 LAB — COMPREHENSIVE METABOLIC PANEL
ALT: 13 U/L (ref 0–44)
AST: 17 U/L (ref 15–41)
Albumin: 3.3 g/dL — ABNORMAL LOW (ref 3.5–5.0)
Alkaline Phosphatase: 66 U/L (ref 38–126)
Anion gap: 10 (ref 5–15)
BUN: 17 mg/dL (ref 8–23)
CO2: 25 mmol/L (ref 22–32)
Calcium: 8.8 mg/dL — ABNORMAL LOW (ref 8.9–10.3)
Chloride: 99 mmol/L (ref 98–111)
Creatinine, Ser: 0.83 mg/dL (ref 0.61–1.24)
GFR, Estimated: >60 mL/min (ref >60)
Glucose, Bld: 135 mg/dL — ABNORMAL HIGH (ref 70–99)
Potassium: 3.9 mmol/L (ref 3.5–5.1)
Sodium: 134 mmol/L — ABNORMAL LOW (ref 135–145)
Total Bilirubin: 0.7 mg/dL (ref 0.3–1.2)
Total Protein: 7.4 g/dL (ref 6.5–8.1)

## 2020-06-15 LAB — CBC WITH DIFFERENTIAL/PLATELET
Band Neutrophils: 8 %
Basophils Absolute: 0 10*3/uL (ref 0.0–0.1)
Basophils Relative: 0 %
Eosinophils Absolute: 0 10*3/uL (ref 0.0–0.5)
Eosinophils Relative: 0 %
HCT: 36.1 % — ABNORMAL LOW (ref 39.0–52.0)
Hemoglobin: 10.9 g/dL — ABNORMAL LOW (ref 13.0–17.0)
Lymphocytes Relative: 10 %
Lymphs Abs: 0.4 10*3/uL — ABNORMAL LOW (ref 0.7–4.0)
MCH: 26.9 pg (ref 26.0–34.0)
MCHC: 30.2 g/dL (ref 30.0–36.0)
MCV: 89.1 fL (ref 80.0–100.0)
Metamyelocytes Relative: 2 %
Monocytes Absolute: 0 10*3/uL — ABNORMAL LOW (ref 0.1–1.0)
Monocytes Relative: 1 %
Neutro Abs: 3.7 10*3/uL (ref 1.7–7.7)
Neutrophils Relative %: 79 %
Platelets: 156 10*3/uL (ref 150–400)
RBC: 4.05 MIL/uL — ABNORMAL LOW (ref 4.22–5.81)
RDW: 17 % — ABNORMAL HIGH (ref 11.5–15.5)
WBC: 4.3 10*3/uL (ref 4.0–10.5)
nRBC: 0 % (ref 0.0–0.2)

## 2020-06-15 LAB — HEMOGLOBIN A1C
Hgb A1c MFr Bld: 6.9 % — ABNORMAL HIGH (ref 4.8–5.6)
Mean Plasma Glucose: 151.33 mg/dL

## 2020-06-15 LAB — RESP PANEL BY RT-PCR (FLU A&B, COVID) ARPGX2
Influenza A by PCR: NEGATIVE
Influenza B by PCR: NEGATIVE
SARS Coronavirus 2 by RT PCR: POSITIVE — AB

## 2020-06-15 LAB — PROTIME-INR
INR: 1.2 (ref 0.8–1.2)
Prothrombin Time: 14.4 seconds (ref 11.4–15.2)

## 2020-06-15 LAB — APTT: aPTT: 37 seconds — ABNORMAL HIGH (ref 24–36)

## 2020-06-15 LAB — LACTIC ACID, PLASMA: Lactic Acid, Venous: 1.8 mmol/L (ref 0.5–1.9)

## 2020-06-15 LAB — BRAIN NATRIURETIC PEPTIDE: B Natriuretic Peptide: 751 pg/mL — ABNORMAL HIGH (ref 0.0–100.0)

## 2020-06-15 LAB — MRSA PCR SCREENING: MRSA by PCR: NEGATIVE

## 2020-06-15 LAB — LIPASE, BLOOD: Lipase: 19 U/L (ref 11–51)

## 2020-06-15 MED ORDER — SODIUM CHLORIDE 0.9 % IV SOLN: 250.0000 mL | INTRAVENOUS | Status: DC | PRN

## 2020-06-15 MED ORDER — BISACODYL 5 MG PO TBEC: 5.0000 mg | DELAYED_RELEASE_TABLET | Freq: Every day | ORAL | Status: DC | PRN

## 2020-06-15 MED ORDER — SODIUM CHLORIDE 0.9 % IV SOLN
1.0000 g | Freq: Once | INTRAVENOUS | Status: AC
  Administered 2020-06-15: 1 g via INTRAVENOUS
  Filled 2020-06-15: qty 10

## 2020-06-15 MED ORDER — ONDANSETRON HCL 4 MG PO TABS: 4.0000 mg | ORAL_TABLET | Freq: Four times a day (QID) | ORAL | Status: DC | PRN

## 2020-06-15 MED ORDER — SODIUM CHLORIDE 0.9% FLUSH
3.0000 mL | Freq: Two times a day (BID) | INTRAVENOUS | Status: DC
  Administered 2020-06-15 – 2020-06-24 (×15): 3 mL via INTRAVENOUS

## 2020-06-15 MED ORDER — ONDANSETRON HCL 4 MG/2ML IJ SOLN: 4.0000 mg | Freq: Four times a day (QID) | INTRAMUSCULAR | Status: DC | PRN

## 2020-06-15 MED ORDER — SODIUM CHLORIDE 0.9% FLUSH
3.0000 mL | Freq: Two times a day (BID) | INTRAVENOUS | Status: DC
  Administered 2020-06-16 – 2020-06-23 (×8): 3 mL via INTRAVENOUS

## 2020-06-15 MED ORDER — SODIUM CHLORIDE 0.9% FLUSH: 3.0000 mL | INTRAVENOUS | Status: DC | PRN

## 2020-06-15 MED ORDER — RISAQUAD PO CAPS
1.0000 | ORAL_CAPSULE | Freq: Every day | ORAL | Status: DC
  Administered 2020-06-15 – 2020-06-25 (×11): 1 via ORAL
  Filled 2020-06-15 (×11): qty 1

## 2020-06-15 MED ORDER — ATORVASTATIN CALCIUM 20 MG PO TABS
20.0000 mg | ORAL_TABLET | Freq: Every day | ORAL | Status: DC
  Administered 2020-06-15 – 2020-06-24 (×10): 20 mg via ORAL
  Filled 2020-06-15 (×10): qty 1

## 2020-06-15 MED ORDER — SODIUM CHLORIDE 0.9 % IV SOLN: INTRAVENOUS | Status: AC

## 2020-06-15 MED ORDER — MAGNESIUM CITRATE PO SOLN: 1.0000 | Freq: Once | ORAL | Status: DC | PRN

## 2020-06-15 MED ORDER — LEVALBUTEROL HCL 0.63 MG/3ML IN NEBU: 0.6300 mg | INHALATION_SOLUTION | Freq: Four times a day (QID) | RESPIRATORY_TRACT | Status: DC | PRN

## 2020-06-15 MED ORDER — FINASTERIDE 5 MG PO TABS
5.0000 mg | ORAL_TABLET | Freq: Every day | ORAL | Status: DC
Start: 2020-06-15 — End: 2020-06-25
  Administered 2020-06-15 – 2020-06-25 (×11): 5 mg via ORAL
  Filled 2020-06-15 (×11): qty 1

## 2020-06-15 MED ORDER — FERROUS SULFATE 325 (65 FE) MG PO TABS
325.0000 mg | ORAL_TABLET | Freq: Every day | ORAL | Status: DC
Start: 2020-06-15 — End: 2020-06-25
  Administered 2020-06-15 – 2020-06-25 (×10): 325 mg via ORAL
  Filled 2020-06-15 (×10): qty 1

## 2020-06-15 MED ORDER — SENNOSIDES-DOCUSATE SODIUM 8.6-50 MG PO TABS: 1.0000 | ORAL_TABLET | Freq: Every evening | ORAL | Status: DC | PRN

## 2020-06-15 MED ORDER — ASPIRIN EC 81 MG PO TBEC: 81.0000 mg | DELAYED_RELEASE_TABLET | Freq: Every day | ORAL | Status: DC

## 2020-06-15 MED ORDER — NITROFURANTOIN MACROCRYSTAL 50 MG PO CAPS
50.0000 mg | ORAL_CAPSULE | Freq: Every day | ORAL | Status: DC
  Administered 2020-06-15: 50 mg via ORAL
  Filled 2020-06-15 (×2): qty 1

## 2020-06-15 MED ORDER — HEPARIN SODIUM (PORCINE) 5000 UNIT/ML IJ SOLN
5000.0000 [IU] | Freq: Three times a day (TID) | INTRAMUSCULAR | Status: DC
  Administered 2020-06-15 – 2020-06-25 (×30): 5000 [IU] via SUBCUTANEOUS
  Filled 2020-06-15 (×31): qty 1

## 2020-06-15 MED ORDER — TAMSULOSIN HCL 0.4 MG PO CAPS
0.4000 mg | ORAL_CAPSULE | Freq: Every day | ORAL | Status: DC
  Administered 2020-06-15 – 2020-06-25 (×11): 0.4 mg via ORAL
  Filled 2020-06-15 (×12): qty 1

## 2020-06-15 MED ORDER — LACTATED RINGERS IV BOLUS (SEPSIS)
1000.0000 mL | Freq: Once | INTRAVENOUS | Status: AC
  Administered 2020-06-15: 1000 mL via INTRAVENOUS

## 2020-06-15 MED ORDER — IPRATROPIUM BROMIDE 0.02 % IN SOLN: 0.5000 mg | Freq: Four times a day (QID) | RESPIRATORY_TRACT | Status: DC | PRN

## 2020-06-15 MED ORDER — MAGNESIUM OXIDE 400 (241.3 MG) MG PO TABS
400.0000 mg | ORAL_TABLET | Freq: Every day | ORAL | Status: DC
  Administered 2020-06-15 – 2020-06-25 (×11): 400 mg via ORAL
  Filled 2020-06-15 (×11): qty 1

## 2020-06-15 MED ORDER — INSULIN ASPART 100 UNIT/ML ~~LOC~~ SOLN
0.0000 [IU] | Freq: Three times a day (TID) | SUBCUTANEOUS | Status: DC
  Administered 2020-06-15 – 2020-06-16 (×2): 1 [IU] via SUBCUTANEOUS
  Administered 2020-06-16 – 2020-06-20 (×9): 2 [IU] via SUBCUTANEOUS
  Administered 2020-06-20: 3 [IU] via SUBCUTANEOUS
  Administered 2020-06-20: 1 [IU] via SUBCUTANEOUS
  Administered 2020-06-21: 2 [IU] via SUBCUTANEOUS
  Administered 2020-06-21: 1 [IU] via SUBCUTANEOUS
  Administered 2020-06-21: 2 [IU] via SUBCUTANEOUS
  Administered 2020-06-22: 1 [IU] via SUBCUTANEOUS
  Administered 2020-06-22 – 2020-06-24 (×8): 2 [IU] via SUBCUTANEOUS
  Administered 2020-06-25: 1 [IU] via SUBCUTANEOUS

## 2020-06-15 MED ORDER — SIMETHICONE 40 MG/0.6ML PO SUSP
80.0000 mg | Freq: Four times a day (QID) | ORAL | Status: DC | PRN
  Filled 2020-06-15: qty 1.2

## 2020-06-15 MED ORDER — ACETAMINOPHEN 650 MG RE SUPP: 650.0000 mg | Freq: Four times a day (QID) | RECTAL | Status: DC | PRN

## 2020-06-15 MED ORDER — SODIUM CHLORIDE 0.9 % IV SOLN: 1.0000 g | INTRAVENOUS | Status: DC

## 2020-06-15 MED ORDER — HYDROMORPHONE HCL 1 MG/ML IJ SOLN: 0.5000 mg | INTRAMUSCULAR | Status: DC | PRN

## 2020-06-15 MED ORDER — HYDRALAZINE HCL 20 MG/ML IJ SOLN: 10.0000 mg | Freq: Four times a day (QID) | INTRAMUSCULAR | Status: DC | PRN

## 2020-06-15 MED ORDER — ACETAMINOPHEN 325 MG PO TABS
650.0000 mg | ORAL_TABLET | Freq: Once | ORAL | Status: AC
  Administered 2020-06-15: 650 mg via ORAL
  Filled 2020-06-15: qty 2

## 2020-06-15 MED ORDER — LINACLOTIDE 145 MCG PO CAPS
145.0000 ug | ORAL_CAPSULE | Freq: Every day | ORAL | Status: DC
  Administered 2020-06-16 – 2020-06-25 (×5): 145 ug via ORAL
  Filled 2020-06-15 (×7): qty 1

## 2020-06-15 MED ORDER — TRAZODONE HCL 50 MG PO TABS
25.0000 mg | ORAL_TABLET | Freq: Every evening | ORAL | Status: DC | PRN
Start: 2020-06-15 — End: 2020-06-25
  Administered 2020-06-15 – 2020-06-18 (×2): 25 mg via ORAL
  Filled 2020-06-15 (×2): qty 1

## 2020-06-15 MED ORDER — CHLORHEXIDINE GLUCONATE CLOTH 2 % EX PADS
6.0000 | MEDICATED_PAD | Freq: Every day | CUTANEOUS | Status: DC
  Administered 2020-06-16 – 2020-06-25 (×10): 6 via TOPICAL

## 2020-06-15 MED ORDER — ACETAMINOPHEN 325 MG PO TABS
650.0000 mg | ORAL_TABLET | Freq: Four times a day (QID) | ORAL | Status: DC | PRN
  Administered 2020-06-16 – 2020-06-17 (×2): 650 mg via ORAL
  Filled 2020-06-15 (×2): qty 2

## 2020-06-15 MED ORDER — OXYCODONE HCL 5 MG PO TABS: 5.0000 mg | ORAL_TABLET | ORAL | Status: DC | PRN

## 2020-06-15 NOTE — ED Triage Notes (Addendum)
Pt c/o abd pain, diarrhea, and shaking for 2 days. Pt non-verbal. Ems states pt was 88% RA, pt placed on 2L enroute to ED. When arrived to ED pt was 96% on RA.

## 2020-06-15 NOTE — ED Notes (Signed)
Date and time results received: 06/15/20 1110 (use smartphrase ".now" to insert current time)  Test: covid Critical Value: positive  Name of Provider Notified: butler   Orders Received? Or Actions Taken?:

## 2020-06-15 NOTE — H&P (Signed)
History and Physical   Patient: [name]                            PCP: Pearson Grippe, MD                    DOB: 04/09/1951            DOA: 06/15/2020 [id number]             DOS: 06/15/2020, 12:43 PM  Patient coming from:   HOME  I have personally reviewed patient's medical records, in electronic medical records, including:   link, and care everywhere.    Chief Complaint:   Chief Complaint  Patient presents with  . Abdominal Pain    History of present illness:    [name] is a 70 y.o. male with medical history significant of chronic nonverbal at baseline hyperlipidemia, hypertension, chronic renal retention with periodic self catheterization, recurrent UTI, BPH, history of kidney stones, periodic constipation uses laxatives, previous AKI, electrolyte normalities.... History was provided by guardian, electronic records reviewed Presented for abdominal pain-fever x2 days.  Patient Denies having:, Cough, SOB, Chest Pain, Abd pain, N/V/D, headache, dizziness, lightheadedness,  Dysuria, Joint pain, rash, open wounds  ED Course:   Vitals: T-max 103.4, pulse 104, currently 86, respiratory rate 27, BP 92/44, satting 93% room air Abnormal labs; CBC CMP reviewed within normal limits, mild chronic anemia hemoglobin 10.9, WBC normal at 4.3, lactic acid normal at 1.8  CT abdomen pelvis : IMPRESSION: Nonobstructing renal calculi on the left similar to that seen on the prior exam. Mild fullness of the collecting system is noted likely related to bladder distension as no obstructing calculi are seen. This was not seen on the prior exam due to bladder decompression.  Chronic bladder wall thickening with small diverticula identified. Mild gaseous distension of the colon without obstructive change. Some residual chronic wall thickening in the rectum is seen.  Initiating IV fluid lactated Ringer and IV antibiotics, blood and urine cultures have been  obtained. SARS-CoV-2 positive -no respiratory symptoms -stable (Per caregiver patient has had his Covid vaccine including booster)   Will be admitted for SIRS likely due to recurrent UTI.    Review of Systems: As per HPI, otherwise 10 point review of systems were negative.   ----------------------------------------------------------------------------------------------------------------------  No Known Allergies  Home MEDs:  Prior to Admission medications   Medication Sig Start Date End Date Taking? Authorizing Provider  acetaminophen (TYLENOL) 500 MG tablet Take 1,000 mg by mouth every 6 (six) hours as needed for mild pain or headache.    [provider]  amoxicillin-clavulanate (AUGMENTIN) 500-125 MG tablet Take 1 tablet (500 mg total) by mouth 3 (three) times daily. 05/18/20   Pauline Aus, PA-C  aspirin EC 81 MG tablet Take 1 tablet (81 mg total) by mouth daily with breakfast. 05/29/19   Mariea Clonts, Courage, MD  atorvastatin (LIPITOR) 20 MG tablet Take 20 mg by mouth at bedtime. 06/09/19   [provider]  bisacodyl (DULCOLAX) 10 MG suppository Place 1 suppository (10 mg total) rectally daily. 03/03/20   Catarina Hartshorn, MD  FEROSUL 325 (65 Fe) MG tablet Take 325 mg by mouth daily. 04/28/20   [provider]  finasteride (PROSCAR) 5 MG tablet Take 1 tablet (5 mg total) by mouth daily. 05/30/20   McKenzie, Mardene Celeste, MD  linaclotide (LINZESS) 145 MCG CAPS capsule Take 145 mcg by mouth daily before  breakfast.    [provider]  magnesium oxide (MAG-OX) 400 (241.3 Mg) MG tablet Take 1 tablet by mouth daily. 05/09/20   [provider]  magnesium oxide (MAG-OX) 400 MG tablet Take 1 tablet (400 mg total) by mouth daily. 10/23/18   Sharee HolsterGreen, Deborah S, NP  metFORMIN (GLUCOPHAGE) 1000 MG tablet Take 1,000 mg by mouth 2 (two) times daily. 04/27/20   [provider]  metFORMIN (GLUCOPHAGE) 850 MG tablet Take 1 tablet (850 mg total) by mouth 2 (two) times  daily with a meal. Patient taking differently: Take 1,000 mg by mouth 2 (two) times daily with a meal. 05/29/19 05/28/20  Shon HaleEmokpae, Courage, MD  nitrofurantoin (MACRODANTIN) 50 MG capsule Take 1 capsule (50 mg total) by mouth at bedtime. 05/30/20   Malen GauzeMcKenzie, Patrick L, MD  Potassium Chloride ER 20 MEQ TBCR Take 20 mEq by mouth every Tuesday, Thursday, Saturday, and Sunday. 1 tab daily by mouth 05/30/19   Shon HaleEmokpae, Courage, MD  simethicone (MYLICON) 40 MG/0.6ML drops Take 1.2 mLs (80 mg total) by mouth 4 (four) times daily as needed for flatulence. 04/05/20   Malissa Hippoehman, Najeeb U, MD  tamsulosin (FLOMAX) 0.4 MG CAPS capsule Take 1 capsule (0.4 mg total) by mouth in the morning and at bedtime. 05/30/20   McKenzie, Mardene CelestePatrick L, MD    PRN MEDs: sodium chloride, acetaminophen **OR** acetaminophen, bisacodyl, hydrALAZINE, HYDROmorphone (DILAUDID) injection, ipratropium, levalbuterol, magnesium citrate, ondansetron **OR** ondansetron (ZOFRAN) IV, oxyCODONE, senna-docusate, simethicone, sodium chloride flush, traZODone  Past Medical History:  Diagnosis Date  . Diabetes mellitus without complication (HCC)   . History of gout   . History of kidney stones   . Hypertension   . Mentally challenged   . Urinary retention     Past Surgical History:  Procedure Laterality Date  . CHOLECYSTECTOMY N/A 05/25/2019   Procedure: OPEN CHOLECYSTECTOMY;  Surgeon: Lucretia RoersBridges, Lindsay C, MD;  Location: [facility];  Service: General;  Laterality: N/A;  . CYSTOSCOPY W/ URETERAL STENT PLACEMENT Bilateral 06/20/2016   Procedure: CYSTOSCOPY WITH RETROGRADE PYELOGRAM/URETERAL STENT PLACEMENT;  Surgeon: Malen GauzePatrick L McKenzie, MD;  Location: [facility];  Service: Urology;  Laterality: Bilateral;  . CYSTOSCOPY WITH RETROGRADE PYELOGRAM, URETEROSCOPY AND STENT PLACEMENT Bilateral 08/20/2016   Procedure: CYSTOSCOPY WITH BILATERAL RETROGRADE PYELOGRAM, BILATERAL URETEROSCOPY,  LASER LITHOTRIPSY OF RIGHT URETERAL CALCULI, STONE BASKET EXTRACTION LEFT URETERAL  CALCULI  AND BILATERAL URETERAL STENT EXCHANGE;  Surgeon: Malen GauzeMcKenzie, Patrick L, MD;  Location: [facility];  Service: Urology;  Laterality: Bilateral;  . CYSTOSCOPY WITH RETROGRADE PYELOGRAM, URETEROSCOPY AND STENT PLACEMENT Left 01/14/2019   Procedure: CYSTOSCOPY WITH LEFT RETROGRADE PYELOGRAM, LEFT URETEROSCOPY AND LEFT URETERAL STENT PLACEMENT;  Surgeon: Malen GauzeMcKenzie, Patrick L, MD;  Location: [facility];  Service: Urology;  Laterality: Left;  . CYSTOSCOPY WITH URETHRAL DILATATION  01/14/2019   Procedure: CYSTOSCOPY WITH URETHRAL DILATATION;  Surgeon: Malen GauzeMcKenzie, Patrick L, MD;  Location: [facility];  Service: Urology;;  . HOLMIUM LASER APPLICATION Bilateral 08/20/2016   Procedure: HOLMIUM LASER APPLICATION;  Surgeon: Malen GauzeMcKenzie, Patrick L, MD;  Location: [facility];  Service: Urology;  Laterality: Bilateral;  . HOLMIUM LASER APPLICATION Left 01/14/2019   Procedure: HOLMIUM LASER APPLICATION;  Surgeon: Malen GauzeMcKenzie, Patrick L, MD;  Location: [facility];  Service: Urology;  Laterality: Left;  . IR CATHETER TUBE CHANGE  07/04/2018  . IR EXCHANGE BILIARY DRAIN  06/09/2018  . IR EXCHANGE BILIARY DRAIN  10/20/2018  . IR EXCHANGE BILIARY DRAIN  01/12/2019  . IR EXCHANGE BILIARY DRAIN  04/07/2019  . IR PERC CHOLECYSTOSTOMY  05/24/2018  . LAPAROTOMY N/A 08/25/2018   Procedure: EXPLORATORY LAPAROTOMY reduction of volvulus;  Surgeon: [name], MD;  Location: [facility];  Service: General;  Laterality: N/A;  . LAPAROTOMY N/A 05/25/2019   Procedure: EXPLORATORY LAPAROTOMY;  Surgeon: [name], MD;  Location: [facility];  Service: General;  Laterality: N/A;     reports that he has never smoked. He has never used smokeless tobacco. He reports that he does not drink alcohol and does not use drugs.   Family History  Problem Relation Age of Onset  . Gallbladder disease Other     Physical Exam:   Vitals:   06/15/20 1100 06/15/20 1200 06/15/20 1205 06/15/20 1206  BP: (!) 107/54 (!) 97/46 (!) 92/44   Pulse: 93 93 86   Resp:  18 17    Temp:    (!) 100.5 F (38.1 C)  TempSrc:    Rectal  SpO2: 94% 94% 93%    Constitutional: NAD, calm, comfortable .Marland Kitchen nonverbal at base line  Eyes: PERRL, lids and conjunctivae normal ENMT: Mucous membranes are moist. Posterior pharynx clear of any exudate or lesions.Normal dentition.  Neck: normal, supple, no masses, no thyromegaly Respiratory: clear to auscultation bilaterally, no wheezing, no crackles. Normal respiratory effort. No accessory muscle use.  Cardiovascular: Regular rate and rhythm, no murmurs / rubs / gallops. No extremity edema. 2+ pedal pulses. No carotid bruits.  Abdomen: no tenderness, no masses palpated. No hepatosplenomegaly. Bowel sounds positive.  Musculoskeletal: no clubbing / cyanosis. No joint deformity upper and lower extremities. Good ROM, no contractures. Normal muscle tone.  Neurologic: CN II-XII grossly intact. Sensation intact, DTR normal. Strength 5/5 in all 4.  Skin: no rashes, lesions, ulcers. No induration Decubitus/ulcers: per nursing documentation Wounds: per nursing documentation     Labs on admission:    I have personally reviewed following labs and imaging studies  CBC: Recent Labs  Lab 06/15/20 0858  WBC 4.3  NEUTROABS 3.7  HGB 10.9*  HCT 36.1*  MCV 89.1  PLT 156   Basic Metabolic Panel: Recent Labs  Lab 06/15/20 0858  NA 134*  K 3.9  CL 99  CO2 25  GLUCOSE 135*  BUN 17  CREATININE 0.83  CALCIUM 8.8*   GFR: CrCl cannot be calculated (Unknown ideal weight.). Liver Function Tests: Recent Labs  Lab 06/15/20 0858  AST 17  ALT 13  ALKPHOS 66  BILITOT 0.7  PROT 7.4  ALBUMIN 3.3*   Recent Labs  Lab 06/15/20 0858  LIPASE 19   No results for input(s): AMMONIA in the last 168 hours. Coagulation Profile: Recent Labs  Lab 06/15/20 0858  INR 1.2   Cardiac Enzymes: No results for input(s): CKTOTAL, CKMB, CKMBINDEX, TROPONINI in the last 168 hours. BNP (last 3 results) No results for input(s): PROBNP in the last  8760 hours. HbA1C: No results for input(s): HGBA1C in the last 72 hours. CBG: No results for input(s): GLUCAP in the last 168 hours. Lipid Profile: No results for input(s): CHOL, HDL, LDLCALC, TRIG, CHOLHDL, LDLDIRECT in the last 72 hours. Thyroid Function Tests: No results for input(s): TSH, T4TOTAL, FREET4, T3FREE, THYROIDAB in the last 72 hours. Anemia Panel: No results for input(s): VITAMINB12, FOLATE, FERRITIN, TIBC, IRON, RETICCTPCT in the last 72 hours. Urine analysis:    Component Value Date/Time   COLORURINE YELLOW 06/15/2020 0912   APPEARANCEUR CLOUDY (A) 06/15/2020 0912   APPEARANCEUR Cloudy (A) 03/17/2020 1621   LABSPEC 1.014 06/15/2020 0912   PHURINE 7.0 06/15/2020 0912  GLUCOSEU NEGATIVE 06/15/2020 0912   HGBUR MODERATE (A) 06/15/2020 0912   BILIRUBINUR NEGATIVE 06/15/2020 0912   BILIRUBINUR Negative 03/17/2020 1621   KETONESUR NEGATIVE 06/15/2020 0912   PROTEINUR 100 (A) 06/15/2020 0912   UROBILINOGEN 1.0 07/20/2014 1718   NITRITE NEGATIVE 06/15/2020 0912   LEUKOCYTESUR LARGE (A) 06/15/2020 0912     Radiologic Exams on Admission:   DG Chest Port 1 View  Result Date: 06/15/2020 CLINICAL DATA:  Questionable sepsis. EXAM: PORTABLE CHEST 1 VIEW COMPARISON:  CT 03/02/2020.  Chest x-ray 02/28/2020. FINDINGS: Mediastinum hilar structures normal. Heart size stable. Low lung volumes with mild bibasilar atelectasis. No pleural effusion or pneumothorax. 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 Abdominal series with decubitus views suggested for confirmation. Degenerative changes lumbar spine IMPRESSION: 1. Low lung volumes with mild bibasilar atelectasis. 2. 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 Abdominal series with decubitus views suggested for confirmation. These results will be called to the ordering clinician or representative by the Radiologist Assistant, and communication documented in the  PACS or [employer]. Electronically Signed   By: Maisie Fus  Register   On: 06/15/2020 09:24   CT Renal Stone Study  Result Date: 06/15/2020 CLINICAL DATA:  COVID-19 positivity with abdominal pain EXAM: CT ABDOMEN AND PELVIS WITHOUT CONTRAST TECHNIQUE: Multidetector CT imaging of the abdomen and pelvis was performed following the standard protocol without IV contrast. COMPARISON:  05/18/2020 ultrasound, CT from 03/02/20 FINDINGS: Lower chest: Mild dependent atelectatic changes are noted. No focal infiltrate is seen. Hepatobiliary: Gallbladder has been surgically removed. Liver is within normal limits. Pancreas: Unremarkable. No pancreatic ductal dilatation or surrounding inflammatory changes. Spleen: Normal in size without focal abnormality. Adrenals/Urinary Tract: Adrenal glands are within normal limits. Kidneys are well visualized bilaterally. Multiple nonobstructing renal calculi are noted on the left. The largest of these is seen in the lower pole measuring 14 mm in greatest dimension. A few small stones are noted within the left renal pelvis although not obstructive in nature. Mild fullness of the collecting systems is noted bilaterally although no obstructing calculi are seen. Bladder is well distended with multiple diverticula identified. Stable wall thickening is noted within the bladder which may be related to chronic outlet obstruction. Stomach/Bowel: Mild gaseous distension of the colon is seen without obstructive change. Stable mild wall thickening in the rectum is seen likely related to chronic inflammation. The appendix is not well visualized. No inflammatory changes to suggest appendicitis are noted. Stomach and small bowel appear within normal limits. Vascular/Lymphatic: Aortic atherosclerosis. No enlarged abdominal or pelvic lymph nodes. Reproductive: Prostate is unremarkable. Other: No abdominal wall hernia or abnormality. No abdominopelvic ascites. Musculoskeletal: Degenerative changes of the  lumbar spine are noted. IMPRESSION: Nonobstructing renal calculi on the left similar to that seen on the prior exam. Mild fullness of the collecting system is noted likely related to bladder distension as no obstructing calculi are seen. This was not seen on the prior exam due to bladder decompression. Chronic bladder wall thickening with small diverticula identified. Mild gaseous distension of the colon without obstructive change. Some residual chronic wall thickening in the rectum is seen. Mild dependent atelectasis. Electronically Signed   By: Alcide Clever M.D.   On: 06/15/2020 12:05    EKG:   Independently reviewed.  Orders placed or performed during the hospital encounter of 06/15/20  . ED EKG 12-Lead  . ED EKG 12-Lead  . EKG 12-Lead  . EKG 12-Lead   ---------------------------------------------------------------------------------------------------------------------------------------  Assessment / Plan:   Principal Problem:   SIRS (systemic inflammatory response syndrome) (HCC) Active Problems:   Urine retention   Intellectual disability   Hyperlipidemia   HTN (hypertension)   Diabetes mellitus type 2, noninsulin dependent (HCC)   UTI (urinary tract infection)   BPH (benign prostatic hyperplasia)   Nonverbal   COVID-19 virus infection   Principal Problem:   SIRS (systemic inflammatory response syndrome) (HCC) -  Patient meets the SIRS criteria, fever T-max 103.4, pulse 104, blood pressure 92/44,WBC normal at 4.3, lactic acid normal at 1.8 Possible source of infection UTI - recurrent  -UA positive for hemoglobin, leukocyte Estrace, negative for nitrites, protein 100, many bacteria,  WBC >50 -CT abdomen pelvis reviewed, nephrolithiasis with no obstruction--bowel gas pattern without obstruction -Covid positive, no respiratory symptoms satting 93% on room air -Continue with IV fluid resuscitation LR -Initiated on antibiotics of Rocephin will be continued -Blood/urine  culture obtained will follow accordingly    UTI (urinary tract infection) -we will follow with urine culture, ED initiated Rocephin will continue for now     COVID-19 virus infection  -screening positive, no respiratory symptoms, satting 92% on room air  - Withholding treatment, steroids at this time  -Per caregiver patient has had his Covid vaccine and boosted   Active Problems:   Urine retention -chronic in and out catheter at home- we we will evaluate for Foley catheter placement    Intellectual disability -PT OT consult for evaluation recommendation    Hyperlipidemia -continue statins    HTN (hypertension) -blood pressure soft at this time, with holding home medication    Diabetes mellitus type 2, noninsulin dependent -but holding Metformin, checking CBG QA CHS, SSI coverage   BPH (benign prostatic hyperplasia) -continue Flomax, monitor    Nonverbal -chronic no changes    Cultures:  -06/15/2020 blood cultures x2 >. -06/15/2020 urine culture >> Antimicrobial: -06/15/2020 IV Rocephin  Consults called:  None -------------------------------------------------------------------------------------------------------------------------------------------- DVT prophylaxis: Heparin SQ Code Status:   Code Status: Full Code   Admission status: Patient will be admitted as Inpatient, with a greater than 2 midnight length of stay. Level of care: Stepdown   Family Communication:  none at bedside  (The above findings and plan of care has been discussed with patient in detail, the patient expressed understanding and agreement of above plan)  --------------------------------------------------------------------------------------------------------------------------------------------------  Disposition Plan:  Anticipated 1-2 days Status is: Inpatient  Remains inpatient appropriate because:Hemodynamically unstable and Inpatient level of care appropriate due to severity of illness   Dispo: The  patient is from: Home              Anticipated d/c is to: Home              Patient currently is not medically stable to d/c.   Difficult to place patient No         ----------------------------------------------------------------------------------------------------------------------------------------------------  Time spent: > than  55  Min.   SIGNED: Kendell Bane, MD, FHM. [facility],  Pager (Please use [email or web address] to page to text)  If 7PM-7AM, please contact night-coverage [email or web address],  06/15/2020, 12:43 PM

## 2020-06-15 NOTE — ED Provider Notes (Signed)
[facility] EMERGENCY DEPARTMENT Provider Note   CSN: [id number] Arrival date & time: 06/15/20  0350     History Chief Complaint  Patient presents with  . Abdominal Pain    [name] is a 70 y.o. male.  He is nonverbal at baseline and has a legal guardian.  His aunt who is his guardian is providing most of the history.  History also provided by EMS.  She said he has some baseline abdominal pain due to kidney stones.  She gives him laxatives to help him move his bowels.  For the last 2 days he has had more abdominal pain than usual, fever and chills.  More loose bowel movements than normal.  No cough or shortness of breath.  She catheterizes him and she said it usually cloudy but today it was darker brown and cloudy.  The history is provided by the patient.  Abdominal Pain Pain location:  Generalized Pain severity:  Moderate Onset quality:  Gradual Timing:  Intermittent Progression:  Worsening Chronicity:  New Context: laxative use   Context: not trauma   Relieved by:  None tried Worsened by:  Nothing Ineffective treatments:  None tried Associated symptoms: chills, diarrhea and fever   Associated symptoms: no chest pain, no cough, no dysuria, no hematemesis, no hematochezia, no hematuria, no shortness of breath, no sore throat and no vomiting        Past Medical History:  Diagnosis Date  . Diabetes mellitus without complication (HCC)   . History of gout   . History of kidney stones   . Hypertension   . Mentally challenged   . Urinary retention     Patient Active Problem List   Diagnosis Date Noted  . Goals of care, counseling/discussion   . Encounter for hospice care discussion   . COVID-19 virus infection 02/28/2020  . Palliative care by specialist   . Pneumoperitoneum 07/04/2019  . Intra-abdominal free air of unknown etiology 07/04/2019  . Cystitis   . Chronic cholecystitis 05/21/2019  . Chronic constipation 03/31/2019  . Cholecystostomy care (HCC)  11/04/2018  . Acute lower UTI 09/18/2018  . Abdominal distention 09/18/2018  . Hypomagnesemia 09/09/2018  . Protein-calorie malnutrition, severe (HCC) 09/09/2018  . Acute blood loss anemia 09/09/2018  . Gangrenous cholecystitis 09/05/2018  . Volvulus (HCC) 09/03/2018  . Ileus (HCC)   . Pneumatosis intestinalis   . Gall bladder disease 08/25/2018  . Small bowel ischemia (HCC)   . Acute gangrenous cholecystitis 05/24/2018  . SBO (small bowel obstruction) (HCC) 05/24/2018  . Abdominal distension   . Pressure injury of skin 04/09/2018  . C. difficile diarrhea 04/08/2018  . Bilateral hydronephrosis 04/08/2018  . Pneumoperitoneum of unknown etiology   . Diarrhea 04/07/2018  . Nonverbal 04/07/2018  . Hydronephrosis with urinary obstruction due to ureteral calculus 06/20/2016  . Sepsis secondary to UTI (HCC) 06/20/2016  . Fever 06/19/2016  . Generalized weakness 06/19/2016  . Diabetes mellitus type 2, noninsulin dependent (HCC) 06/19/2016  . Severe sepsis with septic shock (HCC) 06/19/2016  . UTI (urinary tract infection) 06/19/2016  . Hematuria 06/19/2016  . AKI (acute kidney injury) (HCC) 06/19/2016  . Hyponatremia 06/19/2016  . Hypokalemia 06/19/2016  . BPH (benign prostatic hyperplasia) 06/19/2016  . Megacolon 05/07/2014  . Urine retention 05/07/2014  . Intellectual disability 05/07/2014  . Hyperlipidemia 05/07/2014  . HTN (hypertension) 05/07/2014  . Renal calculus 01/14/2012    Past Surgical History:  Procedure Laterality Date  . CHOLECYSTECTOMY N/A 05/25/2019   Procedure: OPEN CHOLECYSTECTOMY;  Surgeon: [name], MD;  Location: [facility];  Service: General;  Laterality: N/A;  . CYSTOSCOPY W/ URETERAL STENT PLACEMENT Bilateral 06/20/2016   Procedure: CYSTOSCOPY WITH RETROGRADE PYELOGRAM/URETERAL STENT PLACEMENT;  Surgeon: Malen Gauze, MD;  Location: [facility];  Service: Urology;  Laterality: Bilateral;  . CYSTOSCOPY WITH RETROGRADE PYELOGRAM, URETEROSCOPY AND STENT  PLACEMENT Bilateral 08/20/2016   Procedure: CYSTOSCOPY WITH BILATERAL RETROGRADE PYELOGRAM, BILATERAL URETEROSCOPY,  LASER LITHOTRIPSY OF RIGHT URETERAL CALCULI, STONE BASKET EXTRACTION LEFT URETERAL CALCULI  AND BILATERAL URETERAL STENT EXCHANGE;  Surgeon: Malen Gauze, MD;  Location: [facility];  Service: Urology;  Laterality: Bilateral;  . CYSTOSCOPY WITH RETROGRADE PYELOGRAM, URETEROSCOPY AND STENT PLACEMENT Left 01/14/2019   Procedure: CYSTOSCOPY WITH LEFT RETROGRADE PYELOGRAM, LEFT URETEROSCOPY AND LEFT URETERAL STENT PLACEMENT;  Surgeon: Malen Gauze, MD;  Location: [facility];  Service: Urology;  Laterality: Left;  . CYSTOSCOPY WITH URETHRAL DILATATION  01/14/2019   Procedure: CYSTOSCOPY WITH URETHRAL DILATATION;  Surgeon: Malen Gauze, MD;  Location: [facility];  Service: Urology;;  . HOLMIUM LASER APPLICATION Bilateral 08/20/2016   Procedure: HOLMIUM LASER APPLICATION;  Surgeon: Malen Gauze, MD;  Location: [facility];  Service: Urology;  Laterality: Bilateral;  . HOLMIUM LASER APPLICATION Left 01/14/2019   Procedure: HOLMIUM LASER APPLICATION;  Surgeon: Malen Gauze, MD;  Location: [facility];  Service: Urology;  Laterality: Left;  . IR CATHETER TUBE CHANGE  07/04/2018  . IR EXCHANGE BILIARY DRAIN  06/09/2018  . IR EXCHANGE BILIARY DRAIN  10/20/2018  . IR EXCHANGE BILIARY DRAIN  01/12/2019  . IR EXCHANGE BILIARY DRAIN  04/07/2019  . IR PERC CHOLECYSTOSTOMY  05/24/2018  . LAPAROTOMY N/A 08/25/2018   Procedure: EXPLORATORY LAPAROTOMY reduction of volvulus;  Surgeon: [name], MD;  Location: [facility];  Service: General;  Laterality: N/A;  . LAPAROTOMY N/A 05/25/2019   Procedure: EXPLORATORY LAPAROTOMY;  Surgeon: [name], MD;  Location: [facility];  Service: General;  Laterality: N/A;       Family History  Problem Relation Age of Onset  . Gallbladder disease Other     Social History   Tobacco Use  . Smoking status: Never Smoker  . Smokeless tobacco: Never Used   Vaping Use  . Vaping Use: Never used  Substance Use Topics  . Alcohol use: No  . Drug use: No    Home Medications Prior to Admission medications   Medication Sig Start Date End Date Taking? Authorizing Provider  acetaminophen (TYLENOL) 500 MG tablet Take 1,000 mg by mouth every 6 (six) hours as needed for mild pain or headache.    [provider]  amoxicillin-clavulanate (AUGMENTIN) 500-125 MG tablet Take 1 tablet (500 mg total) by mouth 3 (three) times daily. 05/18/20   Pauline Aus, PA-C  aspirin EC 81 MG tablet Take 1 tablet (81 mg total) by mouth daily with breakfast. 05/29/19   Mariea Clonts, Courage, MD  atorvastatin (LIPITOR) 20 MG tablet Take 20 mg by mouth at bedtime. 06/09/19   [provider]  bisacodyl (DULCOLAX) 10 MG suppository Place 1 suppository (10 mg total) rectally daily. 03/03/20   Catarina Hartshorn, MD  FEROSUL 325 (65 Fe) MG tablet Take 325 mg by mouth daily. 04/28/20   [provider]  finasteride (PROSCAR) 5 MG tablet Take 1 tablet (5 mg total) by mouth daily. 05/30/20   McKenzie, Mardene Celeste, MD  linaclotide (LINZESS) 145 MCG CAPS capsule Take 145 mcg by mouth daily before breakfast.    [provider]  magnesium oxide (MAG-OX) 400 (241.3 Mg) MG tablet Take 1 tablet by mouth daily. 05/09/20   [provider]  magnesium oxide (MAG-OX) 400 MG tablet Take 1 tablet (400 mg total) by mouth daily. 10/23/18   Sharee HolsterGreen, Deborah S, NP  metFORMIN (GLUCOPHAGE) 1000 MG tablet Take 1,000 mg by mouth 2 (two) times daily. 04/27/20   [provider]  metFORMIN (GLUCOPHAGE) 850 MG tablet Take 1 tablet (850 mg total) by mouth 2 (two) times daily with a meal. Patient taking differently: Take 1,000 mg by mouth 2 (two) times daily with a meal. 05/29/19 05/28/20  Shon HaleEmokpae, Courage, MD  nitrofurantoin (MACRODANTIN) 50 MG capsule Take 1 capsule (50 mg total) by mouth at bedtime. 05/30/20   Malen GauzeMcKenzie, Patrick L, MD  Potassium Chloride ER 20 MEQ TBCR Take 20 mEq by  mouth every Tuesday, Thursday, Saturday, and Sunday. 1 tab daily by mouth 05/30/19   Shon HaleEmokpae, Courage, MD  simethicone (MYLICON) 40 MG/0.6ML drops Take 1.2 mLs (80 mg total) by mouth 4 (four) times daily as needed for flatulence. 04/05/20   Malissa Hippoehman, Najeeb U, MD  tamsulosin (FLOMAX) 0.4 MG CAPS capsule Take 1 capsule (0.4 mg total) by mouth in the morning and at bedtime. 05/30/20   McKenzie, Mardene CelestePatrick L, MD    Allergies    Patient has no known allergies.  Review of Systems   Review of Systems  Constitutional: Positive for chills and fever.  HENT: Negative for sore throat.   Eyes: Negative for pain.  Respiratory: Negative for cough and shortness of breath.   Cardiovascular: Negative for chest pain.  Gastrointestinal: Positive for abdominal pain and diarrhea. Negative for hematemesis, hematochezia and vomiting.  Genitourinary: Negative for dysuria and hematuria.  Musculoskeletal: Negative for neck pain.  Skin: Negative for rash.  Neurological: Negative for headaches.    Physical Exam Updated Vital Signs BP (!) 107/54   Pulse 93   Temp (!) 101.9 F (38.8 C) (Rectal)   Resp 18   SpO2 94%   Physical Exam Vitals and nursing note reviewed.  Constitutional:      Appearance: He is well-developed and well-nourished.  HENT:     Head: Normocephalic and atraumatic.  Eyes:     Conjunctiva/sclera: Conjunctivae normal.  Cardiovascular:     Rate and Rhythm: Normal rate and regular rhythm.     Heart sounds: No murmur heard.   Pulmonary:     Effort: Pulmonary effort is normal. No respiratory distress.     Breath sounds: Normal breath sounds.  Abdominal:     General: There is no distension.     Palpations: Abdomen is soft.     Tenderness: There is no abdominal tenderness. There is no guarding or rebound.  Musculoskeletal:        General: No edema.     Cervical back: Neck supple.  Skin:    General: Skin is warm and dry.  Neurological:     General: No focal deficit present.     Mental  Status: He is alert.  Psychiatric:        Mood and Affect: Mood and affect normal.     ED Results / Procedures / Treatments   Labs (all labs ordered are listed, but only abnormal results are displayed) Labs Reviewed  RESP PANEL BY RT-PCR (FLU A&B, COVID) ARPGX2 - Abnormal; Notable for the following components:      Result Value   SARS Coronavirus 2 by RT PCR POSITIVE (*)    All other components within normal limits  COMPREHENSIVE METABOLIC PANEL - Abnormal; Notable for the following components:   Sodium 134 (*)    Glucose, Bld 135 (*)    Calcium 8.8 (*)    Albumin 3.3 (*)    All other components within normal limits  CBC WITH DIFFERENTIAL/PLATELET - Abnormal; Notable for the following components:   RBC 4.05 (*)    Hemoglobin 10.9 (*)    HCT 36.1 (*)    RDW 17.0 (*)    Lymphs Abs 0.4 (*)    Monocytes Absolute 0.0 (*)    All other components within normal limits  APTT - Abnormal; Notable for the following components:   aPTT 37 (*)    All other components within normal limits  URINALYSIS, ROUTINE W REFLEX MICROSCOPIC - Abnormal; Notable for the following components:   APPearance CLOUDY (*)    Hgb urine dipstick MODERATE (*)    Protein, ur 100 (*)    Leukocytes,Ua LARGE (*)    WBC, UA >50 (*)    Bacteria, UA MANY (*)    All other components within normal limits  BRAIN NATRIURETIC PEPTIDE - Abnormal; Notable for the following components:   B Natriuretic Peptide 751.0 (*)    All other components within normal limits  HEMOGLOBIN A1C - Abnormal; Notable for the following components:   Hgb A1c MFr Bld 6.9 (*)    All other components within normal limits  GLUCOSE, CAPILLARY - Abnormal; Notable for the following components:   Glucose-Capillary 127 (*)    All other components within normal limits  CULTURE, BLOOD (ROUTINE X 2)  CULTURE, BLOOD (ROUTINE X 2)  MRSA PCR SCREENING  URINE CULTURE  C DIFFICILE QUICK SCREEN W PCR REFLEX  GASTROINTESTINAL PANEL BY PCR, STOOL  (REPLACES STOOL CULTURE)  LACTIC ACID, PLASMA  PROTIME-INR  LIPASE, BLOOD  BASIC METABOLIC PANEL  CBC  PROTIME-INR    EKG EKG Interpretation  Date/Time:  Wednesday June 15 2020 09:03:22 EST Ventricular Rate:  92 PR Interval:    QRS Duration: 122 QT Interval:  320 QTC Calculation: 396 R Axis:   57 Text Interpretation: Sinus rhythm IVCD, consider atypical RBBB Nonspecific repol abnormality, lateral leads Artifact in lead(s) I II III aVR aVL aVF V1 V2 V3 V4 V5 V6 Poor data quality in current ECG precludes serial comparison Confirmed by Meridee Score [phone]) on 06/15/2020 9:08:41 AM   Radiology DG Chest Port 1 View  Result Date: 06/15/2020 CLINICAL DATA:  Questionable sepsis. EXAM: PORTABLE CHEST 1 VIEW COMPARISON:  CT 03/02/2020.  Chest x-ray 02/28/2020. FINDINGS: Mediastinum hilar structures normal. Heart size stable. Low lung volumes with mild bibasilar atelectasis. No pleural effusion or pneumothorax. 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 Abdominal series with decubitus views suggested for confirmation. Degenerative changes lumbar spine IMPRESSION: 1. Low lung volumes with mild bibasilar atelectasis. 2. 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 Abdominal series with decubitus views suggested for confirmation. These results will be called to the ordering clinician or representative by the Radiologist Assistant, and communication documented in the PACS or [employer]. Electronically Signed   By: Maisie Fus  Register   On: 06/15/2020 09:24    Procedures .Critical Care Performed by: Terrilee Files, MD Authorized by: Terrilee Files, MD   Critical care provider statement:    Critical care time (minutes):  45   Critical care was necessary to treat or prevent imminent or life-threatening deterioration of the following conditions:  Sepsis   Critical care was time spent personally  by me on the following  activities:  Discussions with consultants, evaluation of patient's response to treatment, examination of patient, ordering and performing treatments and interventions, ordering and review of laboratory studies, ordering and review of radiographic studies, pulse oximetry, re-evaluation of patient's condition, obtaining history from patient or surrogate, review of old charts and development of treatment plan with patient or surrogate     Medications Ordered in ED Medications  lactated ringers bolus 1,000 mL (0 mLs Intravenous Stopped 06/15/20 1002)  acetaminophen (TYLENOL) tablet 650 mg (650 mg Oral Given 06/15/20 0913)  cefTRIAXone (ROCEPHIN) 1 g in sodium chloride 0.9 % 100 mL IVPB (0 g Intravenous Stopped 06/15/20 1120)    ED Course  I have reviewed the triage vital signs and the nursing notes.  Pertinent labs & imaging results that were available during my care of the patient were reviewed by me and considered in my medical decision making (see chart for details).  Clinical Course as of 06/15/20 1712  Wed Jun 15, 2020  1238 Discussed with [facility] hospitalist Dr. Flossie Dibble who will evaluate the patient for admission [MB]    Clinical Course User Index [MB] Terrilee Files, MD   MDM Rules/Calculators/A&P                         This patient complains of loose stools, fever, worsening chronic abdominal pain; this involves an extensive number of treatment Options and is a complaint that carries with it a high risk of complications and Morbidity. The differential includes UTI, pneumonia, intra-abdominal abscess, infected kidney stone, COVID  I ordered, reviewed and interpreted labs, which included CBC with normal white count, hemoglobin slightly lower than baseline, chemistries fairly normal, urinalysis concerning for signs of infection with greater than 50 whites many bacteria, Covid testing positive I ordered medication IV fluids IV pain medicine oral Tylenol I ordered imaging studies which  included chest x-ray and CT renal and I independently    visualized and interpreted imaging which showed some bladder thickening no obvious stones, no infiltrates Additional history obtained from patient's aunt who is his caregiver Previous records obtained and reviewed in epic, multiple urine cultures in the past grew out mixed I consulted [facility] hospitalist Dr. Flossie Dibble and discussed lab and imaging findings  Critical Interventions: Fluid and antibiotic initiation for patient's SIRS criteria  After the interventions stated above, I reevaluated the patient and found patient to be hemodynamically improved.  He will need to be admitted to the hospital for further management of his infectious symptoms.  [name] was evaluated in Emergency Department on 06/15/2020 for the symptoms described in the history of present illness. He was evaluated in the context of the global COVID-19 pandemic, which necessitated consideration that the patient might be at risk for infection with the SARS-CoV-2 virus that causes COVID-19. Institutional protocols and algorithms that pertain to the evaluation of patients at risk for COVID-19 are in a state of rapid change based on information released by regulatory bodies including the CDC and federal and state organizations. These policies and algorithms were followed during the patient's care in the ED.  Final Clinical Impression(s) / ED Diagnoses Final diagnoses:  Lower urinary tract infectious disease  COVID-19 virus infection  SIRS (systemic inflammatory response syndrome) (HCC)    Rx / DC Orders ED Discharge Orders    None       Terrilee Files, MD 06/15/20 1717

## 2020-06-16 ENCOUNTER — Inpatient Hospital Stay (HOSPITAL_COMMUNITY): Payer: Medicare Other

## 2020-06-16 DIAGNOSIS — R339 Retention of urine, unspecified: Secondary | ICD-10-CM

## 2020-06-16 DIAGNOSIS — N390 Urinary tract infection, site not specified: Secondary | ICD-10-CM | POA: Diagnosis not present

## 2020-06-16 DIAGNOSIS — U071 COVID-19: Secondary | ICD-10-CM

## 2020-06-16 DIAGNOSIS — A4151 Sepsis due to Escherichia coli [E. coli]: Secondary | ICD-10-CM | POA: Diagnosis not present

## 2020-06-16 DIAGNOSIS — N138 Other obstructive and reflux uropathy: Secondary | ICD-10-CM

## 2020-06-16 DIAGNOSIS — E785 Hyperlipidemia, unspecified: Secondary | ICD-10-CM

## 2020-06-16 DIAGNOSIS — I10 Essential (primary) hypertension: Secondary | ICD-10-CM | POA: Diagnosis not present

## 2020-06-16 DIAGNOSIS — N401 Enlarged prostate with lower urinary tract symptoms: Secondary | ICD-10-CM | POA: Diagnosis not present

## 2020-06-16 DIAGNOSIS — A0472 Enterocolitis due to Clostridium difficile, not specified as recurrent: Secondary | ICD-10-CM

## 2020-06-16 LAB — GLUCOSE, CAPILLARY
Glucose-Capillary: 112 mg/dL — ABNORMAL HIGH (ref 70–99)
Glucose-Capillary: 136 mg/dL — ABNORMAL HIGH (ref 70–99)
Glucose-Capillary: 175 mg/dL — ABNORMAL HIGH (ref 70–99)
Glucose-Capillary: 126 mg/dL — ABNORMAL HIGH (ref 70–99)

## 2020-06-16 LAB — BLOOD CULTURE ID PANEL (REFLEXED) - BCID2

## 2020-06-16 LAB — CBC
HCT: 27.9 % — ABNORMAL LOW (ref 39.0–52.0)
Hemoglobin: 8.6 g/dL — ABNORMAL LOW (ref 13.0–17.0)
MCH: 27.6 pg (ref 26.0–34.0)
MCHC: 30.8 g/dL (ref 30.0–36.0)
MCV: 89.4 fL (ref 80.0–100.0)
Platelets: 132 10*3/uL — ABNORMAL LOW (ref 150–400)
RBC: 3.12 MIL/uL — ABNORMAL LOW (ref 4.22–5.81)
RDW: 17.1 % — ABNORMAL HIGH (ref 11.5–15.5)
WBC: 4.9 10*3/uL (ref 4.0–10.5)
nRBC: 0 % (ref 0.0–0.2)

## 2020-06-16 LAB — BASIC METABOLIC PANEL
Anion gap: 6 (ref 5–15)
BUN: 16 mg/dL (ref 8–23)
CO2: 26 mmol/L (ref 22–32)
Calcium: 7.8 mg/dL — ABNORMAL LOW (ref 8.9–10.3)
Chloride: 101 mmol/L (ref 98–111)
Creatinine, Ser: 0.82 mg/dL (ref 0.61–1.24)
GFR, Estimated: >60 mL/min (ref >60)
Glucose, Bld: 139 mg/dL — ABNORMAL HIGH (ref 70–99)
Potassium: 3.6 mmol/L (ref 3.5–5.1)
Sodium: 133 mmol/L — ABNORMAL LOW (ref 135–145)

## 2020-06-16 LAB — C DIFFICILE QUICK SCREEN W PCR REFLEX
C Diff antigen: POSITIVE — AB
C Diff interpretation: DETECTED
C Diff toxin: POSITIVE — AB

## 2020-06-16 LAB — PROTIME-INR
INR: 1.2 (ref 0.8–1.2)
Prothrombin Time: 14.5 seconds (ref 11.4–15.2)

## 2020-06-16 MED ORDER — SODIUM CHLORIDE 0.9 % IV SOLN
2.0000 g | INTRAVENOUS | Status: DC
  Administered 2020-06-16 – 2020-06-25 (×10): 2 g via INTRAVENOUS
  Filled 2020-06-16 (×11): qty 20

## 2020-06-16 MED ORDER — SODIUM CHLORIDE 0.9 % IV SOLN
3.0000 g | Freq: Four times a day (QID) | INTRAVENOUS | Status: DC
  Administered 2020-06-16 (×2): 3 g via INTRAVENOUS
  Filled 2020-06-16 (×6): qty 8

## 2020-06-16 MED ORDER — VANCOMYCIN 50 MG/ML ORAL SOLUTION
500.0000 mg | Freq: Four times a day (QID) | ORAL | Status: DC
  Administered 2020-06-16 – 2020-06-20 (×15): 500 mg via ORAL
  Filled 2020-06-16 (×24): qty 10

## 2020-06-16 MED ORDER — ASPIRIN 81 MG PO CHEW
81.0000 mg | CHEWABLE_TABLET | Freq: Every day | ORAL | Status: DC
  Administered 2020-06-16 – 2020-06-25 (×10): 81 mg
  Filled 2020-06-16 (×10): qty 1

## 2020-06-16 MED ORDER — ASPIRIN 81 MG PO CHEW: 81.0000 mg | CHEWABLE_TABLET | Freq: Every day | ORAL | Status: DC

## 2020-06-16 MED ORDER — METRONIDAZOLE IN NACL 5-0.79 MG/ML-% IV SOLN
500.0000 mg | Freq: Three times a day (TID) | INTRAVENOUS | Status: DC
  Administered 2020-06-16 – 2020-06-20 (×12): 500 mg via INTRAVENOUS
  Filled 2020-06-16 (×12): qty 100

## 2020-06-16 MED ORDER — SIMETHICONE 40 MG/0.6ML PO SUSP
80.0000 mg | Freq: Three times a day (TID) | ORAL | Status: DC
  Administered 2020-06-16 – 2020-06-25 (×27): 80 mg via ORAL
  Filled 2020-06-16 (×3): qty 1.2

## 2020-06-16 NOTE — Plan of Care (Signed)
  Problem: Acute Rehab OT Goals (only OT should resolve) Goal: Pt. Will Perform Grooming Flowsheets (Taken 06/16/2020 0955) Pt Will Perform Grooming:  with supervision  sitting Goal: Pt. Will Perform Upper Body Dressing Flowsheets (Taken 06/16/2020 0955) Pt Will Perform Upper Body Dressing:  with supervision  sitting Goal: Pt. Will Perform Lower Body Dressing Flowsheets (Taken 06/16/2020 0955) Pt Will Perform Lower Body Dressing:  with min assist  sitting/lateral leans  sit to/from stand Goal: Pt. Will Transfer To Toilet Flowsheets (Taken 06/16/2020 [phone]) Pt Will Transfer to Toilet:  with min guard assist  stand pivot transfer  with min assist Goal: Pt/Caregiver Will Perform Home Exercise Program Flowsheets (Taken 06/16/2020 0955) Pt/caregiver will Perform Home Exercise Program:  Increased strength  Both right and left upper extremity  With Supervision  Shon Indelicato OT, MOT

## 2020-06-16 NOTE — Progress Notes (Signed)
PROGRESS NOTE    [name]  XTG:[id number] DOB: 09-27-1950 DOA: 06/15/2020 PCP: Pearson Grippe, MD   Chief Complaint  Patient presents with  . Abdominal Pain    Brief admission narrative:  As per H&P written by Dr. Flossie Dibble on 06/15/2020 [name] is a 70 y.o. male with medical history significant of chronic nonverbal at baseline hyperlipidemia, hypertension, chronic renal retention with periodic self catheterization, recurrent UTI, BPH, history of kidney stones, periodic constipation uses laxatives, previous AKI, electrolyte normalities.... History was provided by guardian, electronic records reviewed Presented for abdominal pain-fever x2 days.  Assessment & Plan: 1-sepsis secondary to E. coli and C. difficile infection -Positive E. coli bacteremia most likely coming from urinary tract. -Patient also experiencing abdominal pain, distention and diarrhea prior to admission; positive C. difficile PCR antigen and toxin. -Continue IV fluids and supportive care -Discontinue regimen -Stop PPI's -Continue treatment with Rocephin, oral vancomycin and IV Flagyl -Follow culture results -Follow clinical response.  2-Urine retention -Chronically requiring in and out catheterizations at home -Foley catheter has been placed at time of admission. -Continue Flomax.  3-Intellectual disability/cognitive impairment -Nonverbal at baseline -Able to follow commands -Continue supportive care  4-history of BPH -Continue Flomax. -continue finasteride  5-hypertension: Presented with systolic blood pressure in admission -Continue IV fluid -Continue holding antihypertensive agents.  6-hyperlipidemia -Continue statins.  7-COVID-19 infection -No URI symptoms -Oxygen saturation 92% on room air -Reports patient has received COVID vaccine and booster. -Continue supportive care -No remdesivir or steroids at this time.   DVT prophylaxis: Heparin Code Status: DNR/DNI Communication: No  family at bedside. Disposition:   Status is: Inpatient  Dispo: The patient is from: Home              Anticipated d/c is to: Home              Patient currently not medically ready for discharge.  Found positive for sepsis secondary to E. coli bacteremia and C. Difficile.  Continue current IV antibiotics and follow clinical response/provide further evaluation/management for incomplete ileus.   Difficult to place patient no    Consultants:   None   Procedures:  See below for x-ray reports.   Antimicrobials:  Rocephin Oral vancomycin IV Flagyl   Subjective: Low-grade temperature overnight; no chest pain, no nausea, no vomiting, no abdominal pain.  Positive abdominal distention and loose stools reported.  Objective: Vitals:   06/16/20 1430 06/16/20 1500 06/16/20 1530 06/16/20 1600  BP: (!) 100/47 (!) 103/53 126/60 (!) 111/54  Pulse: 73 76 79 82  Resp: 13  20 17   Temp:      TempSrc:      SpO2: 94% 94% 94% 98%  Weight:      Height:        Intake/Output Summary (Last 24 hours) at 06/16/2020 1647 Last data filed at 06/16/2020 1606 Gross per 24 hour  Intake 2962.21 ml  Output 2850 ml  Net 112.21 ml   Filed Weights   06/15/20 1422  Weight: 63.2 kg    Examination:  General exam: Nonverbal at baseline; spiking low-grade temperature overnight.  Denies nausea, vomiting or abdominal pain.  Abdominal distention without guarding appreciated on exam.  Patient had a small episode of diarrhea this morning; family reports multiple episodes of loose stools prior to admission. Respiratory system: Clear to auscultation. Respiratory effort normal.  No requiring oxygen supplementation. Cardiovascular system: S1 & S2 heard, no JVD, no rubs, no gallops. Gastrointestinal system: Abdomen is distended, no guarding, tenderness.  Positive bowel sounds. Central nervous system: No new focal neurological deficits. Extremities: No cyanosis or clubbing. Skin: No petechiae. Psychiatry: Mood &  affect appropriate.     Data Reviewed: I have personally reviewed following labs and imaging studies  CBC: Recent Labs  Lab 06/15/20 0858 06/16/20 0421  WBC 4.3 4.9  NEUTROABS 3.7  --   HGB 10.9* 8.6*  HCT 36.1* 27.9*  MCV 89.1 89.4  PLT 156 132*    Basic Metabolic Panel: Recent Labs  Lab 06/15/20 0858 06/16/20 0421  NA 134* 133*  K 3.9 3.6  CL 99 101  CO2 25 26  GLUCOSE 135* 139*  BUN 17 16  CREATININE 0.83 0.82  CALCIUM 8.8* 7.8*    GFR: Estimated Creatinine Clearance: 74.9 mL/min (by C-G formula based on SCr of 0.82 mg/dL).  Liver Function Tests: Recent Labs  Lab 06/15/20 0858  AST 17  ALT 13  ALKPHOS 66  BILITOT 0.7  PROT 7.4  ALBUMIN 3.3*    CBG: Recent Labs  Lab 06/15/20 1713 06/15/20 2150 06/16/20 0740 06/16/20 1209  GLUCAP 127* 139* 112* 136*     Recent Results (from the past 240 hour(s))  Blood Culture (routine x 2)     Status: None (Preliminary result)   Collection Time: 06/15/20  8:58 AM   Specimen: BLOOD  Result Value Ref Range Status   Specimen Description   Final    BLOOD BLOOD RIGHT FOREARM Performed at [facility], [address], [place], [place] [address]    Special Requests   Final    Blood Culture adequate volume BOTTLES DRAWN AEROBIC AND ANAEROBIC Performed at [facility], [address] [place], [place] [address]    Culture  Setup Time   Final    IN BOTH AEROBIC AND ANAEROBIC BOTTLES GRAM NEGATIVE RODS Gram Stain Report Called to,Read Back By and Verified With: R WAGONER,RN@0252  06/16/20 MKELLY CRITICAL VALUE NOTED.  VALUE IS CONSISTENT WITH PREVIOUSLY REPORTED AND CALLED VALUE. Performed at [facility], [address] [place], [place] [address]    Culture GRAM NEGATIVE RODS  Final   Report Status PENDING  Incomplete  Resp Panel by RT-PCR (Flu A&B, Covid) Nasopharyngeal Swab     Status: Abnormal   Collection Time: 06/15/20  9:08 AM   Specimen: Nasopharyngeal Swab; Nasopharyngeal(NP) swabs in vial  transport medium  Result Value Ref Range Status   SARS Coronavirus 2 by RT PCR POSITIVE (A) NEGATIVE Final    Comment: RESULT CALLED TO, READ BACK BY AND VERIFIED WITH: E. GANT 3/2 @1115  S. BEARD (NOTE) SARS-CoV-2 target nucleic acids are DETECTED.  The SARS-CoV-2 RNA is generally detectable in upper respiratory specimens during the acute phase of infection. Positive results are indicative of the presence of the identified virus, but do not rule out bacterial infection or co-infection with other pathogens not detected by the test. Clinical correlation with patient history and other diagnostic information is necessary to determine patient infection status. The expected result is Negative.  Fact Sheet for Patients:  Fact Sheet for Healthcare Providers: BloggerCourse.com  This test is not yet approved or cleared by the [place] FDA and  has been authorized for detection and/or diagnosis of SARS-CoV-2 by FDA under an Emergency Use Authorization (EUA).  This EUA will remain in effect (meaning this test can be used)  for the duration of  the COVID-19 declaration under Section 564(b)(1) of the Act, 21 U.S.C. section 360bbb-3(b)(1), unless the authorization is terminated or revoked sooner.  Influenza A by PCR NEGATIVE NEGATIVE Final   Influenza B by PCR NEGATIVE NEGATIVE Final    Comment: (NOTE) The Xpert Xpress SARS-CoV-2/FLU/RSV plus assay is intended as an aid in the diagnosis of influenza from Nasopharyngeal swab specimens and should not be used as a sole basis for treatment. Nasal washings and aspirates are unacceptable for Xpert Xpress SARS-CoV-2/FLU/RSV testing.  Fact Sheet for Patients: BloggerCourse.com  Fact Sheet for Healthcare Providers: SeriousBroker.it  This test is not yet approved or cleared by the [place] FDA and has been authorized  for detection and/or diagnosis of SARS-CoV-2 by FDA under an Emergency Use Authorization (EUA). This EUA will remain in effect (meaning this test can be used) for the duration of the COVID-19 declaration under Section 564(b)(1) of the Act, 21 U.S.C. section 360bbb-3(b)(1), unless the authorization is terminated or revoked.  Performed at [facility], [address], [place], [place] [address]   Blood Culture (routine x 2)     Status: None (Preliminary result)   Collection Time: 06/15/20  9:11 AM   Specimen: BLOOD  Result Value Ref Range Status   Specimen Description   Final    BLOOD BLOOD LEFT WRIST Performed at [facility], [address] [place], [place] [address]    Special Requests   Final    Blood Culture adequate volume BOTTLES DRAWN AEROBIC AND ANAEROBIC Performed at [facility], [address], [place], [place] [address]    Culture  Setup Time   Final    IN BOTH AEROBIC AND ANAEROBIC BOTTLES GRAM NEGATIVE RODS Gram Stain Report Called to,Read Back By and Verified With: R WAGONER,RN @0249  06/16/20 MKELLY CRITICAL RESULT CALLED TO, READ BACK BY AND VERIFIED WITH: PHARMD rbv steven hurst 08/16/20 0926 FCP Performed at [facility], [address], [place], [place]    Culture GRAM NEGATIVE RODS  Final   Report Status PENDING  Incomplete  Blood Culture ID Panel (Reflexed)     Status: Abnormal   Collection Time: 06/15/20  9:11 AM  Result Value Ref Range Status   Enterococcus faecalis NOT DETECTED NOT DETECTED Final   Enterococcus Faecium NOT DETECTED NOT DETECTED Final   Listeria monocytogenes NOT DETECTED NOT DETECTED Final   Staphylococcus species NOT DETECTED NOT DETECTED Final   Staphylococcus aureus (BCID) NOT DETECTED NOT DETECTED Final   Staphylococcus epidermidis NOT DETECTED NOT DETECTED Final   Staphylococcus lugdunensis NOT DETECTED NOT DETECTED Final   Streptococcus species NOT DETECTED NOT DETECTED Final   Streptococcus agalactiae NOT DETECTED NOT  DETECTED Final   Streptococcus pneumoniae NOT DETECTED NOT DETECTED Final   Streptococcus pyogenes NOT DETECTED NOT DETECTED Final   A.calcoaceticus-baumannii NOT DETECTED NOT DETECTED Final   Bacteroides fragilis NOT DETECTED NOT DETECTED Final   Enterobacterales DETECTED (A) NOT DETECTED Final    Comment: Enterobacterales represent a large order of gram negative bacteria, not a single organism. CRITICAL RESULT CALLED TO, READ BACK BY AND VERIFIED WITH: PHARMD D rbv steven hurst 0928 08/15/20 FCP    Enterobacter cloacae complex NOT DETECTED NOT DETECTED Final   Escherichia coli DETECTED (A) NOT DETECTED Final    Comment: CRITICAL RESULT CALLED TO, READ BACK BY AND VERIFIED WITH: PHARMD D rbv steven hurst [id number] FCP    Klebsiella aerogenes NOT DETECTED NOT DETECTED Final   Klebsiella oxytoca NOT DETECTED NOT DETECTED Final   Klebsiella pneumoniae NOT DETECTED NOT DETECTED Final   Proteus species NOT DETECTED NOT DETECTED Final   Salmonella species NOT  DETECTED NOT DETECTED Final   Serratia marcescens NOT DETECTED NOT DETECTED Final   Haemophilus influenzae NOT DETECTED NOT DETECTED Final   Neisseria meningitidis NOT DETECTED NOT DETECTED Final   Pseudomonas aeruginosa NOT DETECTED NOT DETECTED Final   Stenotrophomonas maltophilia NOT DETECTED NOT DETECTED Final   Candida albicans NOT DETECTED NOT DETECTED Final   Candida auris NOT DETECTED NOT DETECTED Final   Candida glabrata NOT DETECTED NOT DETECTED Final   Candida krusei NOT DETECTED NOT DETECTED Final   Candida parapsilosis NOT DETECTED NOT DETECTED Final   Candida tropicalis NOT DETECTED NOT DETECTED Final   Cryptococcus neoformans/gattii NOT DETECTED NOT DETECTED Final   CTX-M ESBL NOT DETECTED NOT DETECTED Final   Carbapenem resistance IMP NOT DETECTED NOT DETECTED Final   Carbapenem resistance KPC NOT DETECTED NOT DETECTED Final   Carbapenem resistance NDM NOT DETECTED NOT DETECTED Final   Carbapenem resist OXA 48 LIKE  NOT DETECTED NOT DETECTED Final   Carbapenem resistance VIM NOT DETECTED NOT DETECTED Final    Comment: Performed at [facility], [address] [place], [place] [address]  MRSA PCR Screening     Status: None   Collection Time: 06/15/20  2:03 PM   Specimen: Nasopharyngeal  Result Value Ref Range Status   MRSA by PCR NEGATIVE NEGATIVE Final    Comment:        The GeneXpert MRSA Assay (FDA approved for NASAL specimens only), is one component of a comprehensive MRSA colonization surveillance program. It is not intended to diagnose MRSA infection nor to guide or monitor treatment for MRSA infections. Performed at [facility], [address], [place], [place] [address]   C Difficile Quick Screen w PCR reflex     Status: Abnormal   Collection Time: 06/16/20  8:58 AM   Specimen: STOOL  Result Value Ref Range Status   C Diff antigen POSITIVE (A) NEGATIVE Final    Comment: CRITICAL RESULT CALLED TO, READ BACK BY AND VERIFIED WITH: LARIMORE A AT 1520 06/16/2020 RC    C Diff toxin POSITIVE (A) NEGATIVE Final    Comment: CRITICAL RESULT CALLED TO, READ BACK BY AND VERIFIED WITH: LARIMORE A AT 1520 06/16/2020 RC    C Diff interpretation Toxin producing C. difficile detected.  Final    Comment: Performed at [facility], [address] [place], [place] [address]     Radiology Studies: DG Abd 1 View  Result Date: 06/16/2020 CLINICAL DATA:  Abdominal distension. EXAM: ABDOMEN - 1 VIEW COMPARISON:  March 01, 2020.  June 16, 2019. FINDINGS: Stable gaseous distention of large and small bowel is noted most consistent with ileus. At least 2 left renal calculi are noted. IMPRESSION: Stable gaseous distention of large and small bowel most consistent with ileus. Left nephrolithiasis is noted. Electronically Signed   By: Lupita Raider M.D.   On: 06/16/2020 13:50   DG Chest Port 1 View  Result Date: 06/15/2020 CLINICAL DATA:  Questionable sepsis. EXAM: PORTABLE CHEST 1 VIEW COMPARISON:   CT 03/02/2020.  Chest x-ray 02/28/2020. FINDINGS: Mediastinum hilar structures normal. Heart size stable. Low lung volumes with mild bibasilar atelectasis. No pleural effusion or pneumothorax. 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 Abdominal series with decubitus views suggested for confirmation. Degenerative changes lumbar spine IMPRESSION: 1. Low lung volumes with mild bibasilar atelectasis. 2. 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 Abdominal series with decubitus views suggested for confirmation. These results will be called to  the ordering clinician or representative by the Radiologist Assistant, and communication documented in the PACS or [employer] Dashboard. Electronically Signed   By: Maisie Fushomas  Register   On: 06/15/2020 09:24   CT Renal Stone Study  Result Date: 06/15/2020 CLINICAL DATA:  COVID-19 positivity with abdominal pain EXAM: CT ABDOMEN AND PELVIS WITHOUT CONTRAST TECHNIQUE: Multidetector CT imaging of the abdomen and pelvis was performed following the standard protocol without IV contrast. COMPARISON:  05/18/2020 ultrasound, CT from 03/02/20 FINDINGS: Lower chest: Mild dependent atelectatic changes are noted. No focal infiltrate is seen. Hepatobiliary: Gallbladder has been surgically removed. Liver is within normal limits. Pancreas: Unremarkable. No pancreatic ductal dilatation or surrounding inflammatory changes. Spleen: Normal in size without focal abnormality. Adrenals/Urinary Tract: Adrenal glands are within normal limits. Kidneys are well visualized bilaterally. Multiple nonobstructing renal calculi are noted on the left. The largest of these is seen in the lower pole measuring 14 mm in greatest dimension. A few small stones are noted within the left renal pelvis although not obstructive in nature. Mild fullness of the collecting systems is noted bilaterally although no obstructing calculi are seen. Bladder is well  distended with multiple diverticula identified. Stable wall thickening is noted within the bladder which may be related to chronic outlet obstruction. Stomach/Bowel: Mild gaseous distension of the colon is seen without obstructive change. Stable mild wall thickening in the rectum is seen likely related to chronic inflammation. The appendix is not well visualized. No inflammatory changes to suggest appendicitis are noted. Stomach and small bowel appear within normal limits. Vascular/Lymphatic: Aortic atherosclerosis. No enlarged abdominal or pelvic lymph nodes. Reproductive: Prostate is unremarkable. Other: No abdominal wall hernia or abnormality. No abdominopelvic ascites. Musculoskeletal: Degenerative changes of the lumbar spine are noted. IMPRESSION: Nonobstructing renal calculi on the left similar to that seen on the prior exam. Mild fullness of the collecting system is noted likely related to bladder distension as no obstructing calculi are seen. This was not seen on the prior exam due to bladder decompression. Chronic bladder wall thickening with small diverticula identified. Mild gaseous distension of the colon without obstructive change. Some residual chronic wall thickening in the rectum is seen. Mild dependent atelectasis. Electronically Signed   By: Alcide CleverMark  Lukens M.D.   On: 06/15/2020 12:05    Scheduled Meds: . acidophilus  1 capsule Oral Daily  . aspirin  81 mg Per Tube Daily  . atorvastatin  20 mg Oral QHS  . Chlorhexidine Gluconate Cloth  6 each Topical Q0600  . ferrous sulfate  325 mg Oral Daily  . finasteride  5 mg Oral Daily  . heparin  5,000 Units Subcutaneous Q8H  . insulin aspart  0-9 Units Subcutaneous TID WC  . linaclotide  145 mcg Oral QAC breakfast  . magnesium oxide  400 mg Oral Daily  . nitrofurantoin  50 mg Oral QHS  . simethicone  80 mg Oral TID  . sodium chloride flush  3 mL Intravenous Q12H  . sodium chloride flush  3 mL Intravenous Q12H  . tamsulosin  0.4 mg Oral Daily   . vancomycin  500 mg Oral Q6H   Continuous Infusions: . sodium chloride    . cefTRIAXone (ROCEPHIN)  IV Stopped (06/16/20 1136)  . metronidazole 100 mL/hr at 06/16/20 1606     LOS: 1 day    Time spent: 35 minutes   Vassie Lollarlos Madera, MD [facility]   To contact the attending provider between 7A-7P or the covering provider during after hours 7P-7A, please log into  the web site [email or web address] and access using universal [place] password for that web site. If you do not have the password, please call the hospital operator.  06/16/2020, 4:47 PM

## 2020-06-16 NOTE — Progress Notes (Signed)
Blood cultures positive for gram negative rods, MD made aware.

## 2020-06-16 NOTE — Progress Notes (Signed)
Results for CHIP, CANEPA (MRN [id number]) as of 06/16/2020 15:26  Ref. Range 06/16/2020 08:58  C Diff antigen Latest Ref Range: NEGATIVE  POSITIVE (A)  C Diff interpretation Unknown Toxin producing C. difficile detected.  C Diff toxin Latest Ref Range: NEGATIVE  POSITIVE (A)    Dr. Gwenlyn Perking notified. Waiting for further orders.

## 2020-06-16 NOTE — Evaluation (Signed)
Occupational Therapy Evaluation Patient Details Name: [name] MRN: [id number] DOB: [birth year] Today's Date: 06/16/2020    History of Present Illness [name] is a 70 y.o. male with medical history significant of chronic nonverbal at baseline hyperlipidemia, hypertension, chronic renal retention with periodic self catheterization, recurrent UTI, BPH, history of kidney stones, periodic constipation uses laxatives, previous AKI, electrolyte normalities....  History was provided by guardian, electronic records reviewed  Presented for abdominal pain-fever x2 days.   Clinical Impression   Pt sleeping and mildly lethargic at start of evaluation but able to participate in supine to sit bed mobility with minimal assist. Pt did not communicate verbally with this therapist. Pt was able to follow one step directions. Pt demonstrated sit to stand with minimal to moderate assist. Minimal for initial activation of stand but moderate once in standing due to kyphotic posture and posterior leaning. Pt would benefit from continued acute OT to increase functional ADL status by addressing strength and endurance deficits. Recommended to Home health with 24/7 SPV and assist for transfers if family is capable, or SNF for further rehab.     Follow Up Recommendations  Home health OT;Supervision/Assistance - 24 hour;SNF    Equipment Recommendations  None recommended by OT       Precautions / Restrictions Precautions Precautions: Fall Restrictions Weight Bearing Restrictions: No      Mobility Bed Mobility Overal bed mobility: Needs Assistance Bed Mobility: Supine to Sit     Supine to sit: Min assist     General bed mobility comments: Very slow labored movements.    Transfers Overall transfer level: Needs assistance   Transfers: Sit to/from Stand Sit to Stand: Min assist;Mod assist         General transfer comment: Very kyphotic. Difficulty maintaining upright position in standing.  Minimal assist to initiate stand but moderate assist once standing.    Balance Overall balance assessment: Needs assistance Sitting-balance support: Feet supported;Bilateral upper extremity supported (hands on EOB for support.) Sitting balance-[name]: Good     Standing balance support: No upper extremity supported (W/ therapist assiting anteriorly.) Standing balance-[name]: Poor Standing balance comment: Very kyphotic. Unable to stand fully upright.                           ADL either performed or assessed with clinical judgement   ADL Overall ADL's : Needs assistance/impaired                         Toilet Transfer: Moderate assistance;Maximal assistance Toilet Transfer Details (indicate cue type and reason): Simulated via sit to stand from EOB with min assist to initiate movement and moderate assist when standing with poor balance.                 Vision Baseline Vision/History: No visual deficits                  Pertinent Vitals/Pain Pain Assessment: Faces Faces Pain Scale: Hurts little more Pain Location: Unknown location when moving. Pain Descriptors / Indicators: Grimacing Pain Intervention(s): Monitored during session     Hand Dominance  (Unsure. Pt not able to report.)   Extremity/Trunk Assessment Upper Extremity Assessment Upper Extremity Assessment: Generalized weakness;LUE deficits/detail LUE Deficits / Details: L UE noted to have decreased A/ROM. ~145*           Communication Communication Communication: Expressive difficulties   Cognition Arousal/Alertness: Lethargic Behavior  During Therapy: WFL for tasks assessed/performed Overall Cognitive Status: Within Functional Limits for tasks assessed                                 General Comments: Non-verbal but able to follow 1 step commands.   General Comments                  Home Living Family/patient expects to be discharged to::  Private residence Living Arrangements: Other relatives Available Help at Discharge: Family;Available 24 hours/day Type of Home: Mobile home Home Access: Stairs to enter Entrance Stairs-Number of Steps: 3-4 Entrance Stairs-Rails: Right;Left;Can reach both Home Layout: One level     Bathroom Shower/Tub: Chief Strategy Officer: Standard Bathroom Accessibility: Yes   Home Equipment: Environmental consultant - 2 wheels;Wheelchair - manual   Additional Comments: History taken from prior admission due to expressive difficulties.      Prior Functioning/Environment Level of Independence: Needs assistance;Independent with assistive device(s)  Gait / Transfers Assistance Needed: household ambulator with RW ADL's / Homemaking Assistance Needed: assisted by family            OT Problem List: Decreased strength;Decreased range of motion;Impaired balance (sitting and/or standing);Decreased activity tolerance      OT Treatment/Interventions: Self-care/ADL training;Therapeutic exercise;DME and/or AE instruction;Manual therapy;Therapeutic activities;Patient/family education;Balance training    OT Goals(Current goals can be found in the care plan section) Acute Rehab OT Goals Patient Stated Goal: Pt unable to participate OT Goal Formulation: Patient unable to participate in goal setting Time For Goal Achievement: 06/30/20 Potential to Achieve Goals: Good  OT Frequency: Min 2X/week    End of Session Nurse Communication: Mobility status  Activity Tolerance: Patient tolerated treatment well Patient left: in bed;with nursing/sitter in room  OT Visit Diagnosis: Unsteadiness on feet (R26.81);Muscle weakness (generalized) (M62.81)                Time: 8295-6213 OT Time Calculation (min): 19 min Charges:  OT General Charges $OT Visit: 1 Visit OT Evaluation $OT Eval Low Complexity: 1 Low  Edilia Ghuman OT, MOT   Danie Chandler 06/16/2020, 9:51 AM

## 2020-06-16 NOTE — Evaluation (Signed)
Physical Therapy Evaluation Patient Details Name: [name] MRN: [id number] DOB: 26-May-1950 Today's Date: 06/16/2020   History of Present Illness  [name] is a 70 y.o. male with medical history significant of chronic nonverbal at baseline hyperlipidemia, hypertension, chronic renal retention with periodic self catheterization, recurrent UTI, BPH, history of kidney stones, periodic constipation uses laxatives, previous AKI, electrolyte normalities....  History was provided by guardian, electronic records reviewed  Presented for abdominal pain-fever x2 days.    Clinical Impression  Patient limited to a few steps at bedside due to generalized weakness and possible abdominal discomfort with inability to extend trunk.  Patient tolerated sitting up in chair after therapy - RN notified.  Patient will benefit from continued physical therapy in hospital and recommended venue below to increase strength, balance, endurance for safe ADLs and gait.     Follow Up Recommendations SNF;Supervision for mobility/OOB;Supervision - Intermittent    Equipment Recommendations  None recommended by PT    Recommendations for Other Services       Precautions / Restrictions Precautions Precautions: Fall Restrictions Weight Bearing Restrictions: No      Mobility  Bed Mobility Overal bed mobility: Needs Assistance Bed Mobility: Supine to Sit     Supine to sit: Min assist     General bed mobility comments: increased time, labored movement    Transfers Overall transfer level: Needs assistance Equipment used: Rolling walker (2 wheeled) Transfers: Sit to/from [employer] Sit to Stand: Min assist;Mod assist Stand pivot transfers: Min assist;Mod assist       General transfer comment: slow labored movement with difficulty extending trunk due to weakness and abdominal discomfort  Ambulation/Gait   Gait Distance (Feet): 5 Feet Assistive device: Rolling walker (2  wheeled) Gait Pattern/deviations: Decreased step length - right;Decreased step length - left;Decreased stride length;Trunk flexed Gait velocity: slow   General Gait Details: limited to 4-5 slow labored steps at bedside with inability to extend trunk due to weakness and possible abdominal discomfort  Stairs            Wheelchair Mobility    Modified Rankin (Stroke Patients Only)       Balance Overall balance assessment: Needs assistance Sitting-balance support: Feet supported;Bilateral upper extremity supported Sitting balance-[name]: Good Sitting balance - Comments: EOB   Standing balance support: During functional activity;Bilateral upper extremity supported Standing balance-[name]: Poor Standing balance comment: fair/poor using RW                             Pertinent Vitals/Pain Pain Assessment: No/denies pain    Home Living Family/patient expects to be discharged to:: Private residence Living Arrangements: Other relatives Available Help at Discharge: Family;Available 24 hours/day Type of Home: Mobile home Home Access: Stairs to enter Entrance Stairs-Rails: Right;Left;Can reach both Entrance Stairs-Number of Steps: 3-4 Home Layout: One level Home Equipment: Walker - 2 wheels;Wheelchair - manual Additional Comments: History taken from prior admission due to expressive difficulties.    Prior Function Level of Independence: Needs assistance;Independent with assistive device(s)   Gait / Transfers Assistance Needed: household ambulator with RW  ADL's / Homemaking Assistance Needed: assisted by family        Hand Dominance        Extremity/Trunk Assessment   Upper Extremity Assessment Upper Extremity Assessment: Defer to OT evaluation    Lower Extremity Assessment Lower Extremity Assessment: Generalized weakness    Cervical / Trunk Assessment Cervical / Trunk Assessment:  Normal  Communication      Cognition Arousal/Alertness:  Awake/alert Behavior During Therapy: WFL for tasks assessed/performed Overall Cognitive Status: Within Functional Limits for tasks assessed                                 General Comments: Non-verbal but able to follow 1 step commands.      General Comments      Exercises     Assessment/Plan    PT Assessment Patient needs continued PT services  PT Problem List Decreased strength;Decreased activity tolerance;Decreased balance;Decreased mobility       PT Treatment Interventions DME instruction;Gait training;Stair training;Functional mobility training;Therapeutic activities;Therapeutic exercise;Patient/family education;Balance training    PT Goals (Current goals can be found in the Care Plan section)  Acute Rehab PT Goals Patient Stated Goal: not stated (non verbal) PT Goal Formulation: With patient Time For Goal Achievement: 06/30/20 Potential to Achieve Goals: Good    Frequency Min 3X/week   Barriers to discharge        Co-evaluation               AM-PAC PT "6 Clicks" Mobility  Outcome Measure Help needed turning from your back to your side while in a flat bed without using bedrails?: A Little Help needed moving from lying on your back to sitting on the side of a flat bed without using bedrails?: A Lot Help needed moving to and from a bed to a chair (including a wheelchair)?: A Lot Help needed standing up from a chair using your arms (e.g., wheelchair or bedside chair)?: A Lot Help needed to walk in hospital room?: A Lot Help needed climbing 3-5 steps with a railing? : A Lot 6 Click Score: 13    End of Session   Activity Tolerance: Patient tolerated treatment well;Patient limited by fatigue Patient left: in chair;with call bell/phone within reach Nurse Communication: Mobility status PT Visit Diagnosis: Unsteadiness on feet (R26.81);Other abnormalities of gait and mobility (R26.89);Muscle weakness (generalized) (M62.81)    Time: 1000-1025 PT  Time Calculation (min) (ACUTE ONLY): 25 min   Charges:   PT Evaluation $PT Eval Moderate Complexity: 1 Mod PT Treatments $Therapeutic Activity: 23-37 mins        2:52 PM, 06/16/20 Ocie Bob, MPT Physical Therapist with [facility] [phone] office [phone] mobile phone

## 2020-06-16 NOTE — Plan of Care (Signed)
  Problem: Acute Rehab PT Goals(only PT should resolve) Goal: Pt Will Go Sit To Supine/Side Outcome: Progressing Flowsheets (Taken 06/16/2020 1454) Pt will go Sit to Supine/Side:  with supervision  with min guard assist Goal: Patient Will Transfer Sit To/From Stand Outcome: Progressing Flowsheets (Taken 06/16/2020 1454) Patient will transfer sit to/from stand:  with min guard assist  with minimal assist Goal: Pt Will Transfer Bed To Chair/Chair To Bed Outcome: Progressing Flowsheets (Taken 06/16/2020 1454) Pt will Transfer Bed to Chair/Chair to Bed:  min guard assist  with min assist Goal: Pt Will Ambulate Outcome: Progressing Flowsheets (Taken 06/16/2020 1454) Pt will Ambulate:  25 feet  with minimal assist  with rolling walker   2:54 PM, 06/16/20 Ocie Bob, MPT Physical Therapist with [facility] 336 [phone] office [phone] mobile phone

## 2020-06-17 ENCOUNTER — Other Ambulatory Visit: Payer: Self-pay

## 2020-06-17 DIAGNOSIS — I10 Essential (primary) hypertension: Secondary | ICD-10-CM | POA: Diagnosis not present

## 2020-06-17 DIAGNOSIS — A4151 Sepsis due to Escherichia coli [E. coli]: Secondary | ICD-10-CM | POA: Diagnosis not present

## 2020-06-17 DIAGNOSIS — N390 Urinary tract infection, site not specified: Secondary | ICD-10-CM | POA: Diagnosis not present

## 2020-06-17 DIAGNOSIS — N401 Enlarged prostate with lower urinary tract symptoms: Secondary | ICD-10-CM | POA: Diagnosis not present

## 2020-06-17 DIAGNOSIS — U071 COVID-19: Secondary | ICD-10-CM | POA: Diagnosis not present

## 2020-06-17 LAB — GLUCOSE, CAPILLARY
Glucose-Capillary: 103 mg/dL — ABNORMAL HIGH (ref 70–99)
Glucose-Capillary: 154 mg/dL — ABNORMAL HIGH (ref 70–99)
Glucose-Capillary: 189 mg/dL — ABNORMAL HIGH (ref 70–99)
Glucose-Capillary: 163 mg/dL — ABNORMAL HIGH (ref 70–99)

## 2020-06-17 LAB — GASTROINTESTINAL PANEL BY PCR, STOOL (REPLACES STOOL CULTURE)

## 2020-06-17 LAB — BASIC METABOLIC PANEL
Anion gap: 9 (ref 5–15)
BUN: 14 mg/dL (ref 8–23)
CO2: 25 mmol/L (ref 22–32)
Calcium: 8.1 mg/dL — ABNORMAL LOW (ref 8.9–10.3)
Chloride: 102 mmol/L (ref 98–111)
Creatinine, Ser: 0.84 mg/dL (ref 0.61–1.24)
GFR, Estimated: >60 mL/min (ref >60)
Glucose, Bld: 124 mg/dL — ABNORMAL HIGH (ref 70–99)
Potassium: 2.6 mmol/L — CL (ref 3.5–5.1)
Sodium: 136 mmol/L (ref 135–145)

## 2020-06-17 LAB — CBC
HCT: 29 % — ABNORMAL LOW (ref 39.0–52.0)
Hemoglobin: 8.8 g/dL — ABNORMAL LOW (ref 13.0–17.0)
MCH: 27 pg (ref 26.0–34.0)
MCHC: 30.3 g/dL (ref 30.0–36.0)
MCV: 89 fL (ref 80.0–100.0)
Platelets: 145 10*3/uL — ABNORMAL LOW (ref 150–400)
RBC: 3.26 MIL/uL — ABNORMAL LOW (ref 4.22–5.81)
RDW: 17 % — ABNORMAL HIGH (ref 11.5–15.5)
WBC: 7.2 10*3/uL (ref 4.0–10.5)
nRBC: 0 % (ref 0.0–0.2)

## 2020-06-17 LAB — MAGNESIUM: Magnesium: 1.6 mg/dL — ABNORMAL LOW (ref 1.7–2.4)

## 2020-06-17 MED ORDER — SODIUM CHLORIDE 0.9 % IV BOLUS
500.0000 mL | Freq: Once | INTRAVENOUS | Status: AC
  Administered 2020-06-17: 500 mL via INTRAVENOUS

## 2020-06-17 MED ORDER — MAGNESIUM SULFATE 2 GM/50ML IV SOLN
2.0000 g | Freq: Once | INTRAVENOUS | Status: AC
  Administered 2020-06-17: 2 g via INTRAVENOUS
  Filled 2020-06-17: qty 50

## 2020-06-17 MED ORDER — SODIUM CHLORIDE 0.9 % IV SOLN: INTRAVENOUS | Status: DC

## 2020-06-17 MED ORDER — POTASSIUM CHLORIDE IN NACL 40-0.9 MEQ/L-% IV SOLN: INTRAVENOUS | Status: DC

## 2020-06-17 MED ORDER — POTASSIUM CHLORIDE CRYS ER 20 MEQ PO TBCR
40.0000 meq | EXTENDED_RELEASE_TABLET | ORAL | Status: AC
  Administered 2020-06-17 (×2): 40 meq via ORAL
  Filled 2020-06-17 (×3): qty 2

## 2020-06-17 NOTE — Plan of Care (Signed)

## 2020-06-17 NOTE — Progress Notes (Deleted)
   06/17/20 1251  What Happened  Was fall witnessed? Yes  Who witnessed fall? Lyndel Pleasure, NT  Patients activity before fall to/from bed, chair, or stretcher  Point of contact buttocks  Was patient injured? No  Follow Up  MD notified Dr. Gwenlyn Perking  Time MD notified 1254  Vitals  Temp (!) 101.4 F (38.6 C)  Temp Source Oral  BP (!) 119/59  MAP (mmHg) 75  BP Location Left Arm  BP Method Automatic  Patient Position (if appropriate) Lying  Pulse Rate (!) 102  Pulse Rate Source Monitor  Resp 20  Oxygen Therapy  SpO2 94 %  O2 Device Room Air  Pain Assessment  Pain Scale PAINAD  PAINAD (Pain Assessment in Advanced Dementia)  Breathing 0  Negative Vocalization 0  Facial Expression 0  Body Language 0  Consolability 0  PAINAD Score 0  Neurological  Neuro (WDL) X  Level of Consciousness Alert  Orientation Level Other (comment) (nonverbal, unable to assess)  Cognition Developmentally delayed;Follows commands  Speech Mute;Nods/gestures appropriately  Glasgow Coma Scale  Eye Opening 4  Best Verbal Response (NON-intubated) 5  Best Motor Response 6  Glasgow Coma Scale Score 15  Musculoskeletal  Musculoskeletal (WDL) X  Generalized Weakness Yes  Weight Bearing Restrictions No  Integumentary  Integumentary (WDL) WDL  Skin Color Appropriate for ethnicity  Skin Condition Dry  Skin Integrity Intact

## 2020-06-17 NOTE — Progress Notes (Signed)
   06/17/20 1251  What Happened  Was fall witnessed? Yes  Who witnessed fall? Lyndel Pleasure, NT  Patients activity before fall to/from bed, chair, or stretcher  Point of contact buttocks  Was patient injured? No  Follow Up  MD notified Dr. Gwenlyn Perking  Time MD notified 1254  Adult Fall Risk Assessment  Risk Factor Category (scoring not indicated) High fall risk per protocol (document High fall risk);Fall has occurred during this admission (document High fall risk)  Patient Fall Risk Level High fall risk  Adult Fall Risk Interventions  Required Bundle Interventions *See Row Information* High fall risk - low, moderate, and high requirements implemented  Additional Interventions Use of appropriate toileting equipment (bedpan, BSC, etc.);Room near nurses station  Screening for Fall Injury Risk (To be completed on HIGH fall risk patients) - Assessing Need for Floor Mats  Risk For Fall Injury- Criteria for Floor Mats Previous fall this admission  Will Implement Floor Mats Yes  Vitals  Temp (!) 101.4 F (38.6 C)  Temp Source Oral  BP (!) 119/59  MAP (mmHg) 75  BP Location Left Arm  BP Method Automatic  Patient Position (if appropriate) Lying  Pulse Rate (!) 102  Pulse Rate Source Monitor  Resp 20  Oxygen Therapy  SpO2 94 %  O2 Device Room Air  Pain Assessment  Pain Scale PAINAD  PAINAD (Pain Assessment in Advanced Dementia)  Breathing 0  Negative Vocalization 0  Facial Expression 0  Body Language 0  Consolability 0  PAINAD Score 0  Neurological  Neuro (WDL) X  Level of Consciousness Alert  Orientation Level Other (comment) (nonverbal, unable to assess)  Cognition Developmentally delayed;Follows commands  Speech Mute;Nods/gestures appropriately  Glasgow Coma Scale  Eye Opening 4  Best Verbal Response (NON-intubated) 5  Best Motor Response 6  Glasgow Coma Scale Score 15  Musculoskeletal  Musculoskeletal (WDL) X  Generalized Weakness Yes  Weight Bearing Restrictions No   Integumentary  Integumentary (WDL) WDL  Skin Color Appropriate for ethnicity  Skin Condition Dry  Skin Integrity Intact

## 2020-06-17 NOTE — TOC Initial Note (Signed)
Transition of Care [facility]) - Initial/Assessment Note    Patient Details  Name: [name] MRN: [id number] Date of Birth: [birth year]  Transition of [facility]) CM/SW Contact:    Annice Needy, LCSW Phone Number: 06/17/2020, 1:15 PM  Clinical Narrative:                 Patient from home with aunt, Otelia Sergeant, admitted for abdominal pain. Patient recommended for SNF. Family wants HHPT. Prefers [facility] as they have had them in the past. Referral made to [facility] and accepted for RN/PT/OT.  Expected Discharge Plan: Home w Home Health Services Barriers to Discharge: Continued Medical Work up   Patient Goals and CMS Choice Patient states their goals for this hospitalization and ongoing recovery are:: home with home health      Expected Discharge Plan and Services Expected Discharge Plan: Home w Home Health Services     Post Acute Care Choice: Home Health Living arrangements for the past 2 months: Single Family Home                           HH Arranged: RN,PT,OT HH Agency: [facility] Date HH Agency Contacted: 06/17/20 Time HH Agency Contacted: 1309 Representative spoke with at [facility] Agency: Bonita Quin  Prior Living Arrangements/Services Living arrangements for the past 2 months: Single Family Home Lives with:: Relatives Patient language and need for interpreter reviewed:: Yes Do you feel safe going back to the place where you live?: Yes      Need for Family Participation in Patient Care: Yes (Comment) Care giver support system in place?: Yes (comment) Current home services: DME Criminal Activity/Legal Involvement Pertinent to Current Situation/Hospitalization: No - Comment as needed  Activities of Daily Living Home Assistive Devices/Equipment: None (uta) ADL Screening (condition at time of admission) Patient's cognitive ability adequate to safely complete daily activities?: No Is the patient deaf or have difficulty hearing?: No Does the patient have  difficulty seeing, even when wearing glasses/contacts?: No Does the patient have difficulty concentrating, remembering, or making decisions?: Yes Patient able to express need for assistance with ADLs?: No Does the patient have difficulty dressing or bathing?: Yes Independently performs ADLs?: No Communication: Dependent Is this a change from baseline?: Pre-admission baseline Dressing (OT): Dependent Is this a change from baseline?: Pre-admission baseline Grooming: Dependent Is this a change from baseline?: Pre-admission baseline Feeding: Dependent Is this a change from baseline?: Pre-admission baseline Bathing: Dependent Is this a change from baseline?: Pre-admission baseline Toileting: Dependent Is this a change from baseline?: Pre-admission baseline In/Out Bed: Dependent Is this a change from baseline?: Pre-admission baseline Walks in Home: Dependent Is this a change from baseline?: Pre-admission baseline Does the patient have difficulty walking or climbing stairs?: Yes Weakness of Legs: Both Weakness of Arms/Hands: Both  Permission Sought/Granted Permission sought to share information with : Family Supports    Share Information with NAME: [name] Long           Emotional Assessment Appearance:: Appears stated age   Affect (typically observed): Unable to Assess   Alcohol / Substance Use: Not Applicable Psych Involvement: No (comment)  Admission diagnosis:  Lower urinary tract infectious disease [N39.0] SIRS (systemic inflammatory response syndrome) (HCC) [R65.10] COVID-19 virus infection [U07.1] Patient Active Problem List   Diagnosis Date Noted  . SIRS (systemic inflammatory response syndrome) (HCC) 06/15/2020  . Goals of care, counseling/discussion   . Encounter for hospice care discussion   . COVID-19  virus infection 02/28/2020  . Palliative care by specialist   . Pneumoperitoneum 07/04/2019  . Intra-abdominal free air of unknown etiology 07/04/2019  .  Cystitis   . Chronic cholecystitis 05/21/2019  . Chronic constipation 03/31/2019  . Cholecystostomy care (HCC) 11/04/2018  . Acute lower UTI 09/18/2018  . Abdominal distention 09/18/2018  . Protein-calorie malnutrition, severe (HCC) 09/09/2018  . Acute blood loss anemia 09/09/2018  . Gangrenous cholecystitis 09/05/2018  . Pneumatosis intestinalis   . Gall bladder disease 08/25/2018  . Small bowel ischemia (HCC)   . Acute gangrenous cholecystitis 05/24/2018  . SBO (small bowel obstruction) (HCC) 05/24/2018  . Abdominal distension   . Pressure injury of skin 04/09/2018  . C. difficile diarrhea 04/08/2018  . Bilateral hydronephrosis 04/08/2018  . Pneumoperitoneum of unknown etiology   . Diarrhea 04/07/2018  . Nonverbal 04/07/2018  . Hydronephrosis with urinary obstruction due to ureteral calculus 06/20/2016  . Sepsis secondary to UTI (HCC) 06/20/2016  . Fever 06/19/2016  . Generalized weakness 06/19/2016  . Diabetes mellitus type 2, noninsulin dependent (HCC) 06/19/2016  . Severe sepsis with septic shock (HCC) 06/19/2016  . UTI (urinary tract infection) 06/19/2016  . Hematuria 06/19/2016  . AKI (acute kidney injury) (HCC) 06/19/2016  . Hyponatremia 06/19/2016  . Hypokalemia 06/19/2016  . BPH (benign prostatic hyperplasia) 06/19/2016  . Urine retention 05/07/2014  . Intellectual disability 05/07/2014  . Hyperlipidemia 05/07/2014  . HTN (hypertension) 05/07/2014  . Renal calculus 01/14/2012   PCP:  Pearson Grippe, MD Pharmacy:   [facility] [phone] - [place], [place] - [address] AT [facility] [address] [place] [address] Phone: [phone] Fax: [phone]  [facility] [place], [place] - [address] [place] [address] Phone: [phone] Fax: [phone]     Social Determinants of Health (SDOH) Interventions    Readmission Risk Interventions Readmission Risk  Prevention Plan 05/26/2019 09/20/2018 09/19/2018  Transportation Screening Complete Complete Complete  PCP or Specialist Appt within 3-5 Days Not Complete - -  HRI or Home Care Consult Complete - -  Social Work Consult for Recovery Care Planning/Counseling Complete - -  Palliative Care Screening Not Complete - -  Medication Review (RN Care Manager) Complete Complete Complete  PCP or Specialist appointment within 3-5 days of discharge - Complete Complete  PCP/Specialist Appt Not Complete comments - - SNF resident. Sees facility MD  HRI or Home Care Consult - Not Complete -  HRI or Home Care Consult Pt Refusal Comments - Patient is discharging to SNF for rehab -  SW Recovery Care/Counseling Consult - Complete -  Palliative Care Screening - Not Applicable -  Skilled Nursing Facility - Complete -  Some recent data might be hidden

## 2020-06-17 NOTE — Progress Notes (Signed)
Physical Therapy Treatment Patient Details Name: [name] MRN: [id number] DOB: 1950-08-01 Today's Date: 06/17/2020    History of Present Illness [name] is a 70 y.o. male with medical history significant of chronic nonverbal at baseline hyperlipidemia, hypertension, chronic renal retention with periodic self catheterization, recurrent UTI, BPH, history of kidney stones, periodic constipation uses laxatives, previous AKI, electrolyte normalities....  History was provided by guardian, electronic records reviewed  Presented for abdominal pain-fever x2 days.    PT Comments    Patient demonstrates slow labored movement for sitting up at bedside requiring Min assist to pull self to sitting, fair carryover for completing BLE requiring frequent verbal cueing and demonstration, limited to a few steps at bedside due to inability to extend trunk and generalized weakness.  Patient tolerated sitting up in chair after therapy - RN aware.  Patient will benefit from continued physical therapy in hospital and recommended venue below to increase strength, balance, endurance for safe ADLs and gait.       Follow Up Recommendations  SNF;Supervision for mobility/OOB;Supervision - Intermittent     Equipment Recommendations       Recommendations for Other Services       Precautions / Restrictions Precautions Precautions: Fall Restrictions Weight Bearing Restrictions: No    Mobility  Bed Mobility Overal bed mobility: Needs Assistance Bed Mobility: Supine to Sit     Supine to sit: Min assist     General bed mobility comments: increased time, labored movement    Transfers Overall transfer level: Needs assistance Equipment used: Rolling walker (2 wheeled) Transfers: Sit to/from [employer] Sit to Stand: Min assist;Mod assist Stand pivot transfers: Min assist;Mod assist       General transfer comment: flexed trunk, increased time, labored  movement  Ambulation/Gait Ambulation/Gait assistance: Mod assist;Max assist Gait Distance (Feet): 5 Feet Assistive device: Rolling walker (2 wheeled) Gait Pattern/deviations: Decreased step length - right;Decreased step length - left;Decreased stride length;Trunk flexed Gait velocity: slow   General Gait Details: limited to 4-5 slow labored steps at bedside with inability to extend trunk due to weakness   Stairs             Wheelchair Mobility    Modified Rankin (Stroke Patients Only)       Balance Overall balance assessment: Needs assistance Sitting-balance support: Feet supported;No upper extremity supported Sitting balance-[name]: Good Sitting balance - Comments: EOB   Standing balance support: During functional activity;Bilateral upper extremity supported Standing balance-[name]: Poor Standing balance comment: fair/poor using RW                            Cognition Arousal/Alertness: Awake/alert Behavior During Therapy: WFL for tasks assessed/performed Overall Cognitive Status: Within Functional Limits for tasks assessed                                        Exercises General Exercises - Lower Extremity Ankle Circles/Pumps: Seated;AROM;Strengthening;Both;5 reps Long Arc Quad: Seated;AROM;Strengthening;Both;10 reps Hip Flexion/Marching: Seated;AROM;Strengthening;Both;10 reps    General Comments        Pertinent Vitals/Pain Pain Assessment: Faces Faces Pain Scale: Hurts a little bit Pain Location: over buttocks when scooting in chair Pain Descriptors / Indicators: Grimacing;Discomfort;Sore Pain Intervention(s): Limited activity within patient's tolerance;Monitored during session;Repositioned    Home Living Family/patient expects to be discharged to:: Private residence Living Arrangements: Other  relatives                  Prior Function            PT Goals (current goals can now be found in the care  plan section) Acute Rehab PT Goals Patient Stated Goal: not stated (non verbal) PT Goal Formulation: With patient Time For Goal Achievement: 06/30/20 Potential to Achieve Goals: Good Progress towards PT goals: Progressing toward goals    Frequency           PT Plan Current plan remains appropriate    Co-evaluation              AM-PAC PT "6 Clicks" Mobility   Outcome Measure  Help needed turning from your back to your side while in a flat bed without using bedrails?: A Little Help needed moving from lying on your back to sitting on the side of a flat bed without using bedrails?: A Little Help needed moving to and from a bed to a chair (including a wheelchair)?: A Lot Help needed standing up from a chair using your arms (e.g., wheelchair or bedside chair)?: A Lot Help needed to walk in hospital room?: A Lot Help needed climbing 3-5 steps with a railing? : Total 6 Click Score: 13    End of Session   Activity Tolerance: Patient tolerated treatment well;Patient limited by fatigue Patient left: in chair;with call bell/phone within reach;with chair alarm set Nurse Communication: Mobility status PT Visit Diagnosis: Unsteadiness on feet (R26.81);Other abnormalities of gait and mobility (R26.89);Muscle weakness (generalized) (M62.81)     Time: 6301-6010 PT Time Calculation (min) (ACUTE ONLY): 32 min  Charges:  $Therapeutic Exercise: 8-22 mins $Therapeutic Activity: 8-22 mins                     10:22 AM, 06/17/20 Ocie Bob, MPT Physical Therapist with [facility] [phone] office [phone] mobile phone

## 2020-06-17 NOTE — Progress Notes (Signed)
   06/17/20 1251  Assess: MEWS Score  Temp (!) 101.4 F (38.6 C)  BP (!) 119/59  Pulse Rate (!) 102  Resp 20  Level of Consciousness Alert  SpO2 94 %  O2 Device Room Air  Assess: MEWS Score  MEWS Temp 1  MEWS Systolic 0  MEWS Pulse 1  MEWS RR 0  MEWS LOC 0  MEWS Score 2  MEWS Score Color Yellow  Assess: if the MEWS score is Yellow or Red  Were vital signs taken at a resting state? Yes  Focused Assessment No change from prior assessment  Early Detection of Sepsis Score *See Row Information* High  MEWS guidelines implemented *See Row Information* Yes  Treat  Pain Scale PAINAD  Breathing 0  Negative Vocalization 0  Facial Expression 0  Body Language 0  Consolability 0  PAINAD Score 0  Take Vital Signs  Increase Vital Sign Frequency  Yellow: Q 2hr X 2 then Q 4hr X 2, if remains yellow, continue Q 4hrs  Escalate  MEWS: Escalate Yellow: discuss with charge nurse/RN and consider discussing with provider and RRT  Notify: Charge Nurse/RN  Name of Charge Nurse/RN Notified Angelena Sole RN Judeth Cornfield- primary nurse)  Date Charge Nurse/RN Notified 06/17/20  Time Charge Nurse/RN Notified 1317  Notify: Provider  Provider Name/Title Dr. Gwenlyn Perking  Date Provider Notified 06/17/20  Time Provider Notified 1255  Notification Type Page  Notification Reason Change in status  Provider response No new orders  Date of Provider Response 06/17/20  Time of Provider Response 1300

## 2020-06-17 NOTE — Progress Notes (Signed)
PROGRESS NOTE    [name]  ZOX:[id number] DOB: Aug 11, 1950 DOA: 06/15/2020 PCP: Pearson Grippe, MD   Chief Complaint  Patient presents with  . Abdominal Pain    Brief admission narrative:  As per H&P written by Dr. Flossie Dibble on 06/15/2020 [name] is a 70 y.o. male with medical history significant of chronic nonverbal at baseline hyperlipidemia, hypertension, chronic renal retention with periodic self catheterization, recurrent UTI, BPH, history of kidney stones, periodic constipation uses laxatives, previous AKI, electrolyte normalities.... History was provided by guardian, electronic records reviewed Presented for abdominal pain-fever x2 days.  Assessment & Plan: 1-sepsis secondary to E. coli, Klebsiella pneumonia and C. difficile infection -Positive E. coli bacteremia most likely coming from urinary tract. -Patient also experiencing abdominal pain, distention and diarrhea prior to admission; positive C. difficile PCR antigen and toxin. -Continue IV fluids and supportive care -continue holding on laxatives regimen.  -Stop/avoid PPI's -Continue treatment with Rocephin, oral vancomycin and IV Flagyl -Follow culture results and sensitivity. -Continue to follow clinical response.  2-Urine retention -Chronically requiring in and out catheterizations at home -Foley catheter has been placed at time of admission. -Continue Flomax.  3-Intellectual disability/cognitive impairment -Nonverbal at baseline -Able to follow commands -Continue supportive care  4-history of BPH -Continue Flomax. -continue finasteride  5-hypertension: Presented with systolic blood pressure in admission -Continue IV fluid -Continue holding antihypertensive agents.  6-hyperlipidemia -Continue statins.  7-COVID-19 infection -No URI symptoms -Oxygen saturation 92% on room air -Reports patient has received COVID vaccine and booster. -Continue supportive care -No remdesivir or steroids at this  time.  8-hypokalemia/hypomagnesemia -Will replete and continue maintenance supplementation -In the setting of GI losses -Follow electrolytes trend.   DVT prophylaxis: Heparin Code Status: DNR/DNI Communication: No family at bedside. Disposition:   Status is: Inpatient  Dispo: The patient is from: Home              Anticipated d/c is to: Home              Patient currently not medically ready for discharge.  Found positive for sepsis secondary to E. coli bacteremia and C. Difficile.  Continue current IV antibiotics and follow clinical response/provide further evaluation/management for incomplete ileus.   Difficult to place patient no    Consultants:   None   Procedures:  See below for x-ray reports.   Antimicrobials:  Rocephin Oral vancomycin IV Flagyl   Subjective: Continued spiking fever, no chest pain, no nausea, no vomiting.  With ongoing loose stools (but improving as per nursing report).  Found with hypokalemia and hypomagnesemia.  None injured assisted fall while transferring from chair to bed today.  Objective: Vitals:   06/17/20 0355 06/17/20 0612 06/17/20 1008 06/17/20 1251  BP: (!) 105/51 (!) 98/45 112/64 (!) 119/59  Pulse: 86 79 82 (!) 102  Resp: Temp: 99.6 F (37.6 C) 98 F (36.7 C) 98.4 F (36.9 C) (!) 101.4 F (38.6 C)  TempSrc: Oral   Oral  SpO2: 96% 95% 94% 94%  Weight:      Height:        Intake/Output Summary (Last 24 hours) at 06/17/2020 1345 Last data filed at 06/17/2020 1226 Gross per 24 hour  Intake 749.73 ml  Output 775 ml  Net -25.27 ml   Filed Weights   06/15/20 1422 06/16/20 2335  Weight: 63.2 kg 67 kg    Examination: General exam: Alert, awake, following commands.  Nonverbal at baseline.  Still spiking fever.  Having loose stools (improving per nursing report).  No chest pain, no nausea, no vomiting, no abdominal pain. Respiratory system: Clear to auscultation. Respiratory effort normal.  No requiring oxygen  supplementation and not using accessory muscles. Cardiovascular system:RRR. No murmurs, rubs, gallops.  No JVD on exam. Gastrointestinal system: Abdomen is distended, soft and nontender. No guarding appreciated.  Positive bowel sounds. Central nervous system: No new focal neurological deficits. Extremities: No cyanosis or clubbing. Skin: No petechiae. Psychiatry: Mood & affect appropriate.     Data Reviewed: I have personally reviewed following labs and imaging studies  CBC: Recent Labs  Lab 06/15/20 0858 06/16/20 0421 06/17/20 0605  WBC 4.3 4.9 7.2  NEUTROABS 3.7  --   --   HGB 10.9* 8.6* 8.8*  HCT 36.1* 27.9* 29.0*  MCV 89.1 89.4 89.0  PLT 156 132* 145*    Basic Metabolic Panel: Recent Labs  Lab 06/15/20 0858 06/16/20 0421 06/17/20 0605  NA 134* 133* 136  K 3.9 3.6 2.6*  CL 99 101 102  CO2 GLUCOSE 135* 139* 124*  BUN CREATININE 0.83 0.82 0.84  CALCIUM 8.8* 7.8* 8.1*  MG  --   --  1.6*    GFR: Estimated Creatinine Clearance: 76.5 mL/min (by C-G formula based on SCr of 0.84 mg/dL).  Liver Function Tests: Recent Labs  Lab 06/15/20 0858  AST 17  ALT 13  ALKPHOS 66  BILITOT 0.7  PROT 7.4  ALBUMIN 3.3*    CBG: Recent Labs  Lab 06/16/20 1209 06/16/20 1746 06/16/20 2058 06/17/20 0741 06/17/20 1053  GLUCAP 136* 175* 126* 103* 154*     Recent Results (from the past 240 hour(s))  Blood Culture (routine x 2)     Status: None (Preliminary result)   Collection Time: 06/15/20  8:58 AM   Specimen: BLOOD  Result Value Ref Range Status   Specimen Description   Final    BLOOD BLOOD RIGHT FOREARM Performed at [facility], [address] [place], [place] [address]    Special Requests   Final    Blood Culture adequate volume BOTTLES DRAWN AEROBIC AND ANAEROBIC Performed at [facility], [address], [place], [place] [address]    Culture  Setup Time   Final    IN BOTH AEROBIC AND ANAEROBIC BOTTLES GRAM NEGATIVE RODS Gram Stain  Report Called to,Read Back By and Verified With: R WAGONER,RN@0252  06/16/20 MKELLY CRITICAL VALUE NOTED.  VALUE IS CONSISTENT WITH PREVIOUSLY REPORTED AND CALLED VALUE.    Culture   Final    GRAM NEGATIVE RODS IDENTIFICATION TO FOLLOW Performed at [facility], [address], [place], [place] [address]    Report Status PENDING  Incomplete  Resp Panel by RT-PCR (Flu A&B, Covid) Nasopharyngeal Swab     Status: Abnormal   Collection Time: 06/15/20  9:08 AM   Specimen: Nasopharyngeal Swab; Nasopharyngeal(NP) swabs in vial transport medium  Result Value Ref Range Status   SARS Coronavirus 2 by RT PCR POSITIVE (A) NEGATIVE Final    Comment: RESULT CALLED TO, READ BACK BY AND VERIFIED WITH: E. GANT 3/2  S. BEARD (NOTE) SARS-CoV-2 target nucleic acids are DETECTED.  The SARS-CoV-2 RNA is generally detectable in upper respiratory specimens during the acute phase of infection. Positive results are indicative of the presence of the identified virus, but do not rule out bacterial infection or co-infection with other pathogens not detected by the test. Clinical correlation with patient history and other diagnostic information  is necessary to determine patient infection status. The expected result is Negative.  Fact Sheet for Patients: BloggerCourse.com  Fact Sheet for Healthcare Providers: SeriousBroker.it  This test is not yet approved or cleared by the [place] FDA and  has been authorized for detection and/or diagnosis of SARS-CoV-2 by FDA under an Emergency Use Authorization (EUA).  This EUA will remain in effect (meaning this test can be used)  for the duration of  the COVID-19 declaration under Section 564(b)(1) of the Act, 21 U.S.C. section 360bbb-3(b)(1), unless the authorization is terminated or revoked sooner.     Influenza A by PCR NEGATIVE NEGATIVE Final   Influenza B by PCR NEGATIVE NEGATIVE Final     Comment: (NOTE) The Xpert Xpress SARS-CoV-2/FLU/RSV plus assay is intended as an aid in the diagnosis of influenza from Nasopharyngeal swab specimens and should not be used as a sole basis for treatment. Nasal washings and aspirates are unacceptable for Xpert Xpress SARS-CoV-2/FLU/RSV testing.  Fact Sheet for Patients: BloggerCourse.com  Fact Sheet for Healthcare Providers: SeriousBroker.it  This test is not yet approved or cleared by the [place] FDA and has been authorized for detection and/or diagnosis of SARS-CoV-2 by FDA under an Emergency Use Authorization (EUA). This EUA will remain in effect (meaning this test can be used) for the duration of the COVID-19 declaration under Section 564(b)(1) of the Act, 21 U.S.C. section 360bbb-3(b)(1), unless the authorization is terminated or revoked.  Performed at [facility], [address] [place], [place] [address]   Blood Culture (routine x 2)     Status: Abnormal (Preliminary result)   Collection Time: 06/15/20  9:11 AM   Specimen: BLOOD  Result Value Ref Range Status   Specimen Description   Final    BLOOD BLOOD LEFT WRIST Performed at [facility], [address] [place], [place] [address]    Special Requests   Final    Blood Culture adequate volume BOTTLES DRAWN AEROBIC AND ANAEROBIC Performed at [facility], [address] [place], [place] [address]    Culture  Setup Time   Final    IN BOTH AEROBIC AND ANAEROBIC BOTTLES GRAM NEGATIVE RODS Gram Stain Report Called to,Read Back By and Verified With: R WAGONER,RN @0249  06/16/20 MKELLY CRITICAL RESULT CALLED TO, READ BACK BY AND VERIFIED WITH: PHARMD rbv steven hurst 08/16/20 0926 FCP    Culture (A)  Final    ESCHERICHIA COLI SUSCEPTIBILITIES TO FOLLOW Performed at [facility], [address], [place], [place]    Report Status PENDING  Incomplete  Blood Culture ID Panel (Reflexed)     Status: Abnormal    Collection Time: 06/15/20  9:11 AM  Result Value Ref Range Status   Enterococcus faecalis NOT DETECTED NOT DETECTED Final   Enterococcus Faecium NOT DETECTED NOT DETECTED Final   Listeria monocytogenes NOT DETECTED NOT DETECTED Final   Staphylococcus species NOT DETECTED NOT DETECTED Final   Staphylococcus aureus (BCID) NOT DETECTED NOT DETECTED Final   Staphylococcus epidermidis NOT DETECTED NOT DETECTED Final   Staphylococcus lugdunensis NOT DETECTED NOT DETECTED Final   Streptococcus species NOT DETECTED NOT DETECTED Final   Streptococcus agalactiae NOT DETECTED NOT DETECTED Final   Streptococcus pneumoniae NOT DETECTED NOT DETECTED Final   Streptococcus pyogenes NOT DETECTED NOT DETECTED Final   A.calcoaceticus-baumannii NOT DETECTED NOT DETECTED Final   Bacteroides fragilis NOT DETECTED NOT DETECTED Final   Enterobacterales DETECTED (A) NOT DETECTED Final    Comment: Enterobacterales represent a large order of  gram negative bacteria, not a single organism. CRITICAL RESULT CALLED TO, READ BACK BY AND VERIFIED WITH: PHARMD D rbv steven hurst [phone] FCP    Enterobacter cloacae complex NOT DETECTED NOT DETECTED Final   Escherichia coli DETECTED (A) NOT DETECTED Final    Comment: CRITICAL RESULT CALLED TO, READ BACK BY AND VERIFIED WITH: PHARMD D rbv steven hurst [phone] FCP    Klebsiella aerogenes NOT DETECTED NOT DETECTED Final   Klebsiella oxytoca NOT DETECTED NOT DETECTED Final   Klebsiella pneumoniae NOT DETECTED NOT DETECTED Final   Proteus species NOT DETECTED NOT DETECTED Final   Salmonella species NOT DETECTED NOT DETECTED Final   Serratia marcescens NOT DETECTED NOT DETECTED Final   Haemophilus influenzae NOT DETECTED NOT DETECTED Final   Neisseria meningitidis NOT DETECTED NOT DETECTED Final   Pseudomonas aeruginosa NOT DETECTED NOT DETECTED Final   Stenotrophomonas maltophilia NOT DETECTED NOT DETECTED Final   Candida albicans NOT DETECTED NOT DETECTED Final    Candida auris NOT DETECTED NOT DETECTED Final   Candida glabrata NOT DETECTED NOT DETECTED Final   Candida krusei NOT DETECTED NOT DETECTED Final   Candida parapsilosis NOT DETECTED NOT DETECTED Final   Candida tropicalis NOT DETECTED NOT DETECTED Final   Cryptococcus neoformans/gattii NOT DETECTED NOT DETECTED Final   CTX-M ESBL NOT DETECTED NOT DETECTED Final   Carbapenem resistance IMP NOT DETECTED NOT DETECTED Final   Carbapenem resistance KPC NOT DETECTED NOT DETECTED Final   Carbapenem resistance NDM NOT DETECTED NOT DETECTED Final   Carbapenem resist OXA 48 LIKE NOT DETECTED NOT DETECTED Final   Carbapenem resistance VIM NOT DETECTED NOT DETECTED Final    Comment: Performed at [facility], [address] [place], [place] [address]  Urine culture     Status: Abnormal (Preliminary result)   Collection Time: 06/15/20  9:12 AM   Specimen: Urine, Catheterized  Result Value Ref Range Status   Specimen Description   Final    URINE, CATHETERIZED Performed at [facility], [address] [place], [place] [address]    Special Requests   Final    NONE Performed at [facility], [address] [place], [place] [address]    Culture (A)  Final    >=100,000 COLONIES/mL KLEBSIELLA PNEUMONIAE >=100,000 COLONIES/mL ESCHERICHIA COLI SUSCEPTIBILITIES TO FOLLOW Performed at [facility], [address] [place], [place] [address]    Report Status PENDING  Incomplete  MRSA PCR Screening     Status: None   Collection Time: 06/15/20  2:03 PM   Specimen: Nasopharyngeal  Result Value Ref Range Status   MRSA by PCR NEGATIVE NEGATIVE Final    Comment:        The GeneXpert MRSA Assay (FDA approved for NASAL specimens only), is one component of a comprehensive MRSA colonization surveillance program. It is not intended to diagnose MRSA infection nor to guide or monitor treatment for MRSA infections. Performed at [facility], [address] [place], [place] [address]   C  Difficile Quick Screen w PCR reflex     Status: Abnormal   Collection Time: 06/16/20  8:58 AM   Specimen: STOOL  Result Value Ref Range Status   C Diff antigen POSITIVE (A) NEGATIVE Final    Comment: CRITICAL RESULT CALLED TO, READ BACK BY AND VERIFIED WITH: LARIMORE A AT 1520 06/16/2020 RC    C Diff toxin POSITIVE (A) NEGATIVE Final    Comment: CRITICAL RESULT CALLED TO, READ BACK BY AND VERIFIED WITH:  LARIMORE A AT 1520 06/16/2020 RC    C Diff interpretation Toxin producing C. difficile detected.  Final    Comment: Performed at [facility], [address] [place], [place] [address]  Gastrointestinal Panel by PCR , Stool     Status: Abnormal   Collection Time: 06/16/20  1:53 PM   Specimen: STOOL  Result Value Ref Range Status   Campylobacter species NOT DETECTED NOT DETECTED Final   Plesimonas shigelloides NOT DETECTED NOT DETECTED Final   Salmonella species NOT DETECTED NOT DETECTED Final   Yersinia enterocolitica NOT DETECTED NOT DETECTED Final   Vibrio species NOT DETECTED NOT DETECTED Final   Vibrio cholerae NOT DETECTED NOT DETECTED Final   Enteroaggregative E coli (EAEC) NOT DETECTED NOT DETECTED Final   Enteropathogenic E coli (EPEC) NOT DETECTED NOT DETECTED Final   Enterotoxigenic E coli (ETEC) NOT DETECTED NOT DETECTED Final   Shiga like toxin producing E coli (STEC) NOT DETECTED NOT DETECTED Final   Shigella/Enteroinvasive E coli (EIEC) NOT DETECTED NOT DETECTED Final   Cryptosporidium NOT DETECTED NOT DETECTED Final   Cyclospora cayetanensis NOT DETECTED NOT DETECTED Final   Entamoeba histolytica NOT DETECTED NOT DETECTED Final   Giardia lamblia NOT DETECTED NOT DETECTED Final   Adenovirus F40/41 DETECTED (A) NOT DETECTED Final   Astrovirus NOT DETECTED NOT DETECTED Final   Norovirus GI/GII NOT DETECTED NOT DETECTED Final   Rotavirus A NOT DETECTED NOT DETECTED Final   Sapovirus (I, II, IV, and V) NOT DETECTED NOT DETECTED Final    Comment: Performed at [facility], [address], [place], [place] [address]     Radiology Studies: DG Abd 1 View  Result Date: 06/16/2020 CLINICAL DATA:  Abdominal distension. EXAM: ABDOMEN - 1 VIEW COMPARISON:  March 01, 2020.  June 16, 2019. FINDINGS: Stable gaseous distention of large and small bowel is noted most consistent with ileus. At least 2 left renal calculi are noted. IMPRESSION: Stable gaseous distention of large and small bowel most consistent with ileus. Left nephrolithiasis is noted. Electronically Signed   By: Lupita Raider M.D.   On: 06/16/2020 13:50    Scheduled Meds: . acidophilus  1 capsule Oral Daily  . aspirin  81 mg Per Tube Daily  . atorvastatin  20 mg Oral QHS  . Chlorhexidine Gluconate Cloth  6 each Topical Q0600  . ferrous sulfate  325 mg Oral Daily  . finasteride  5 mg Oral Daily  . heparin  5,000 Units Subcutaneous Q8H  . insulin aspart  0-9 Units Subcutaneous TID WC  . linaclotide  145 mcg Oral QAC breakfast  . magnesium oxide  400 mg Oral Daily  . simethicone  80 mg Oral TID  . sodium chloride flush  3 mL Intravenous Q12H  . sodium chloride flush  3 mL Intravenous Q12H  . tamsulosin  0.4 mg Oral Daily  . vancomycin  500 mg Oral Q6H   Continuous Infusions: . sodium chloride    . 0.9 % NaCl with KCl 40 mEq / L 75 mL/hr at 06/17/20 0919  . cefTRIAXone (ROCEPHIN)  IV 2 g (06/17/20 1226)  . magnesium sulfate bolus IVPB    . metronidazole 500 mg (06/17/20 0921)     LOS: 2 days    Time spent: 30 minutes   Vassie Loll, MD [facility]   To contact the attending provider between 7A-7P or the covering provider during after hours 7P-7A, please log into the web site [email or web address] and access using universal   password for that web site. If you do not have the password, please call the hospital operator.  06/17/2020, 1:45 PM

## 2020-06-18 DIAGNOSIS — N401 Enlarged prostate with lower urinary tract symptoms: Secondary | ICD-10-CM | POA: Diagnosis not present

## 2020-06-18 DIAGNOSIS — I10 Essential (primary) hypertension: Secondary | ICD-10-CM | POA: Diagnosis not present

## 2020-06-18 DIAGNOSIS — U071 COVID-19: Secondary | ICD-10-CM | POA: Diagnosis not present

## 2020-06-18 DIAGNOSIS — A4151 Sepsis due to Escherichia coli [E. coli]: Secondary | ICD-10-CM | POA: Diagnosis not present

## 2020-06-18 DIAGNOSIS — N390 Urinary tract infection, site not specified: Secondary | ICD-10-CM | POA: Diagnosis not present

## 2020-06-18 LAB — BASIC METABOLIC PANEL
Anion gap: 10 (ref 5–15)
BUN: 17 mg/dL (ref 8–23)
CO2: 21 mmol/L — ABNORMAL LOW (ref 22–32)
Calcium: 8 mg/dL — ABNORMAL LOW (ref 8.9–10.3)
Chloride: 108 mmol/L (ref 98–111)
Creatinine, Ser: 0.73 mg/dL (ref 0.61–1.24)
GFR, Estimated: >60 mL/min (ref >60)
Glucose, Bld: 135 mg/dL — ABNORMAL HIGH (ref 70–99)
Potassium: 3.6 mmol/L (ref 3.5–5.1)
Sodium: 139 mmol/L (ref 135–145)

## 2020-06-18 LAB — CBC
HCT: 30.6 % — ABNORMAL LOW (ref 39.0–52.0)
Hemoglobin: 9.2 g/dL — ABNORMAL LOW (ref 13.0–17.0)
MCH: 26.7 pg (ref 26.0–34.0)
MCHC: 30.1 g/dL (ref 30.0–36.0)
MCV: 89 fL (ref 80.0–100.0)
Platelets: 130 10*3/uL — ABNORMAL LOW (ref 150–400)
RBC: 3.44 MIL/uL — ABNORMAL LOW (ref 4.22–5.81)
RDW: 17.2 % — ABNORMAL HIGH (ref 11.5–15.5)
WBC: 9.2 10*3/uL (ref 4.0–10.5)
nRBC: 0 % (ref 0.0–0.2)

## 2020-06-18 LAB — CULTURE, BLOOD (ROUTINE X 2)
Special Requests: ADEQUATE
Special Requests: ADEQUATE

## 2020-06-18 LAB — URINE CULTURE: Culture: >=100000 — AB

## 2020-06-18 LAB — GLUCOSE, CAPILLARY
Glucose-Capillary: 120 mg/dL — ABNORMAL HIGH (ref 70–99)
Glucose-Capillary: 170 mg/dL — ABNORMAL HIGH (ref 70–99)
Glucose-Capillary: 153 mg/dL — ABNORMAL HIGH (ref 70–99)
Glucose-Capillary: 236 mg/dL — ABNORMAL HIGH (ref 70–99)

## 2020-06-18 LAB — MAGNESIUM: Magnesium: 2.1 mg/dL (ref 1.7–2.4)

## 2020-06-18 NOTE — Progress Notes (Signed)
PROGRESS NOTE    [name]  MEQ:[id number] DOB: May 31, 1950 DOA: 06/15/2020 PCP: Pearson Grippe, MD   Chief Complaint  Patient presents with  . Abdominal Pain    Brief admission narrative:  As per H&P written by Dr. Flossie Dibble on 06/15/2020 [name] is a 70 y.o. male with medical history significant of chronic nonverbal at baseline hyperlipidemia, hypertension, chronic renal retention with periodic self catheterization, recurrent UTI, BPH, history of kidney stones, periodic constipation uses laxatives, previous AKI, electrolyte normalities.... History was provided by guardian, electronic records reviewed Presented for abdominal pain-fever x2 days.  Assessment & Plan: 1-sepsis secondary to E. coli, Klebsiella pneumonia and C. difficile infection -Positive E. coli bacteremia most likely coming from urinary tract. -Patient also experiencing abdominal pain, distention and diarrhea prior to admission; positive C. difficile PCR antigen and toxin. -Continue IV fluids and supportive care -continue holding on laxatives regimen.  -Stop/avoid PPI's -Continue treatment with Rocephin, oral vancomycin and IV Flagyl -Follow culture results and sensitivity. -Continue to follow clinical response.  2-Urine retention -Chronically requiring in and out catheterizations at home -Foley catheter has been placed at time of admission. -Continue Flomax.  3-Intellectual disability/cognitive impairment -Nonverbal at baseline -Able to follow commands -Continue supportive care  4-history of BPH -Continue Flomax. -continue finasteride  5-hypertension: Presented with systolic blood pressure in admission -Continue IV fluid -Continue holding antihypertensive agents.  6-hyperlipidemia -Continue statins.  7-COVID-19 infection -No URI symptoms -Oxygen saturation 92% on room air -Reports patient has received COVID vaccine and booster. -Continue supportive care -No remdesivir or steroids at this  time.  8-hypokalemia/hypomagnesemia -Will replete and continue maintenance supplementation -In the setting of GI losses -Follow electrolytes trend.   DVT prophylaxis: Heparin Code Status: DNR/DNI Communication: No family at bedside. Disposition:   Status is: Inpatient  Dispo: The patient is from: Home              Anticipated d/c is to: Home              Patient currently not medically ready for discharge.  Found positive for sepsis secondary to E. coli bacteremia and C. Difficile.  Continue current IV antibiotics and follow clinical response/provide further evaluation/management for incomplete ileus.   Difficult to place patient no    Consultants:   None   Procedures:  See below for x-ray reports.   Antimicrobials:  IV Rocephin Oral vancomycin IV Flagyl   Subjective: Afebrile, no chest pain, no nausea, no vomiting.  Still with loose stools, but improving.  Tolerating diet.  Objective: Vitals:   06/18/20 0500 06/18/20 0614 06/18/20 0819 06/18/20 1431  BP:  (!) 91/46 (!) 82/45 (!) 106/56  Pulse:  69 61 84  Resp:  17  16  Temp:  98 F (36.7 C)  97.9 F (36.6 C)  TempSrc:      SpO2:  97% 96% 97%  Weight: 66.6 kg     Height:        Intake/Output Summary (Last 24 hours) at 06/18/2020 1825 Last data filed at 06/18/2020 1700 Gross per 24 hour  Intake 1370 ml  Output 600 ml  Net 770 ml   Filed Weights   06/15/20 1422 06/16/20 2335 06/18/20 0500  Weight: 63.2 kg 67 kg 66.6 kg    Examination: General exam: Alert, awake, reports no abdominal pain.  Still having loose stools, much improved.  Afebrile.  No nausea or vomiting.  Tolerating diet without problems. Respiratory system: Clear to auscultation. Respiratory effort normal.  No using  accessory muscle.  No requiring oxygen supplementation. Cardiovascular system:RRR. No murmurs, rubs, gallops.  No JVD. Gastrointestinal system: Abdomen is less distended, soft and nontender.  Positive bowel sounds appreciated on  exam.  No guarding. Central nervous system: No new focal neurological deficits. Extremities: No cyanosis, no clubbing. Skin: No rashes, no petechiae. Psychiatry: Mood & affect appropriate.     Data Reviewed: I have personally reviewed following labs and imaging studies  CBC: Recent Labs  Lab 06/15/20 0858 06/16/20 0421 06/17/20 0605 06/18/20 0549  WBC 4.3 4.9 7.2 9.2  NEUTROABS 3.7  --   --   --   HGB 10.9* 8.6* 8.8* 9.2*  HCT 36.1* 27.9* 29.0* 30.6*  MCV 89.1 89.4 89.0 89.0  PLT 156 132* 145* 130*    Basic Metabolic Panel: Recent Labs  Lab 06/15/20 0858 06/16/20 0421 06/17/20 0605 06/18/20 0549  NA 134* 133* 136 139  K 3.9 3.6 2.6* 3.6  CL 99 101 102 108  CO2 25 26 25  21*  GLUCOSE 135* 139* 124* 135*  BUN 17 16 14 17   CREATININE 0.83 0.82 0.84 0.73  CALCIUM 8.8* 7.8* 8.1* 8.0*  MG  --   --  1.6* 2.1    GFR: Estimated Creatinine Clearance: 80.3 mL/min (by C-G formula based on SCr of 0.73 mg/dL).  Liver Function Tests: Recent Labs  Lab 06/15/20 0858  AST 17  ALT 13  ALKPHOS 66  BILITOT 0.7  PROT 7.4  ALBUMIN 3.3*    CBG: Recent Labs  Lab 06/17/20 1555 06/17/20 2040 06/18/20 0749 06/18/20 1124 06/18/20 1615  GLUCAP 189* 163* 120* 170* 153*    Recent Results (from the past 240 hour(s))  Blood Culture (routine x 2)     Status: Abnormal   Collection Time: 06/15/20  8:58 AM   Specimen: BLOOD  Result Value Ref Range Status   Specimen Description   Final    BLOOD BLOOD RIGHT FOREARM Performed at [facility], [address] [place], [address] [place]    Special Requests   Final    Blood Culture adequate volume BOTTLES DRAWN AEROBIC AND ANAEROBIC Performed at [facility], [address] [place], [address] [place]    Culture  Setup Time   Final    IN BOTH AEROBIC AND ANAEROBIC BOTTLES GRAM NEGATIVE RODS Gram Stain Report Called to,Read Back By and Verified With: R WAGONER,RN@0252  06/16/20 MKELLY CRITICAL VALUE NOTED.  VALUE IS CONSISTENT  WITH PREVIOUSLY REPORTED AND CALLED VALUE.    Culture (A)  Final    ESCHERICHIA COLI SUSCEPTIBILITIES PERFORMED ON PREVIOUS CULTURE WITHIN THE LAST 5 DAYS. Performed at [facility], [address] [place], [address] [place]    Report Status 06/18/2020 FINAL  Final  Resp Panel by RT-PCR (Flu A&B, Covid) Nasopharyngeal Swab     Status: Abnormal   Collection Time: 06/15/20  9:08 AM   Specimen: Nasopharyngeal Swab; Nasopharyngeal(NP) swabs in vial transport medium  Result Value Ref Range Status   SARS Coronavirus 2 by RT PCR POSITIVE (A) NEGATIVE Final    Comment: RESULT CALLED TO, READ BACK BY AND VERIFIED WITH: E. GANT 3/2 @1115  S. BEARD (NOTE) SARS-CoV-2 target nucleic acids are DETECTED.  The SARS-CoV-2 RNA is generally detectable in upper respiratory specimens during the acute phase of infection. Positive results are indicative of the presence of the identified virus, but do not rule out bacterial infection or co-infection with other pathogens not detected by the test. Clinical correlation with patient history and other diagnostic information  is necessary to determine patient infection status. The expected result is Negative.  Fact Sheet for Patients: BloggerCourse.comhttps://www.fda.gov/media/152166/download  Fact Sheet for Healthcare Providers: SeriousBroker.ithttps://www.fda.gov/media/152162/download  This test is not yet approved or cleared by the [place] FDA and  has been authorized for detection and/or diagnosis of SARS-CoV-2 by FDA under an Emergency Use Authorization (EUA).  This EUA will remain in effect (meaning this test can be used)  for the duration of  the COVID-19 declaration under Section 564(b)(1) of the Act, 21 U.S.C. section 360bbb-3(b)(1), unless the authorization is terminated or revoked sooner.     Influenza A by PCR NEGATIVE NEGATIVE Final   Influenza B by PCR NEGATIVE NEGATIVE Final    Comment: (NOTE) The Xpert Xpress SARS-CoV-2/FLU/RSV plus assay is intended as an  aid in the diagnosis of influenza from Nasopharyngeal swab specimens and should not be used as a sole basis for treatment. Nasal washings and aspirates are unacceptable for Xpert Xpress SARS-CoV-2/FLU/RSV testing.  Fact Sheet for Patients: BloggerCourse.comhttps://www.fda.gov/media/152166/download  Fact Sheet for Healthcare Providers: SeriousBroker.ithttps://www.fda.gov/media/152162/download  This test is not yet approved or cleared by the [place] FDA and has been authorized for detection and/or diagnosis of SARS-CoV-2 by FDA under an Emergency Use Authorization (EUA). This EUA will remain in effect (meaning this test can be used) for the duration of the COVID-19 declaration under Section 564(b)(1) of the Act, 21 U.S.C. section 360bbb-3(b)(1), unless the authorization is terminated or revoked.  Performed at [facility], [address] [place], [place] [address]   Blood Culture (routine x 2)     Status: Abnormal   Collection Time: 06/15/20  9:11 AM   Specimen: BLOOD  Result Value Ref Range Status   Specimen Description   Final    BLOOD BLOOD LEFT WRIST Performed at [facility], [address] [place], [place] [address]    Special Requests   Final    Blood Culture adequate volume BOTTLES DRAWN AEROBIC AND ANAEROBIC Performed at [facility], [address] [place], [place] [address]    Culture  Setup Time   Final    IN BOTH AEROBIC AND ANAEROBIC BOTTLES GRAM NEGATIVE RODS Gram Stain Report Called to,Read Back By and Verified With: R WAGONER,RN @0249  06/16/20 MKELLY CRITICAL RESULT CALLED TO, READ BACK BY AND VERIFIED WITH: PHARMD rbv steven hurst [phone] FCP Performed at [facility], [address] [place], [place] [address]    Culture ESCHERICHIA COLI (A)  Final   Report Status 06/18/2020 FINAL  Final   Organism ID, Bacteria ESCHERICHIA COLI  Final      Susceptibility   Escherichia coli - MIC*    AMPICILLIN 8 SENSITIVE Sensitive     CEFAZOLIN <=4 SENSITIVE Sensitive     CEFEPIME  <=0.12 SENSITIVE Sensitive     CEFTAZIDIME <=1 SENSITIVE Sensitive     CEFTRIAXONE <=0.25 SENSITIVE Sensitive     CIPROFLOXACIN >=4 RESISTANT Resistant     GENTAMICIN <=1 SENSITIVE Sensitive     IMIPENEM 0.5 SENSITIVE Sensitive     TRIMETH/SULFA >=320 RESISTANT Resistant     AMPICILLIN/SULBACTAM 4 SENSITIVE Sensitive     PIP/TAZO <=4 SENSITIVE Sensitive     * ESCHERICHIA COLI  Blood Culture ID Panel (Reflexed)     Status: Abnormal   Collection Time: 06/15/20  9:11 AM  Result Value Ref Range Status   Enterococcus faecalis NOT DETECTED NOT DETECTED Final   Enterococcus Faecium NOT DETECTED NOT DETECTED Final   Listeria monocytogenes NOT DETECTED NOT DETECTED Final  Staphylococcus species NOT DETECTED NOT DETECTED Final   Staphylococcus aureus (BCID) NOT DETECTED NOT DETECTED Final   Staphylococcus epidermidis NOT DETECTED NOT DETECTED Final   Staphylococcus lugdunensis NOT DETECTED NOT DETECTED Final   Streptococcus species NOT DETECTED NOT DETECTED Final   Streptococcus agalactiae NOT DETECTED NOT DETECTED Final   Streptococcus pneumoniae NOT DETECTED NOT DETECTED Final   Streptococcus pyogenes NOT DETECTED NOT DETECTED Final   A.calcoaceticus-baumannii NOT DETECTED NOT DETECTED Final   Bacteroides fragilis NOT DETECTED NOT DETECTED Final   Enterobacterales DETECTED (A) NOT DETECTED Final    Comment: Enterobacterales represent a large order of gram negative bacteria, not a single organism. CRITICAL RESULT CALLED TO, READ BACK BY AND VERIFIED WITH: PHARMD D rbv steven hurst [phone] FCP    Enterobacter cloacae complex NOT DETECTED NOT DETECTED Final   Escherichia coli DETECTED (A) NOT DETECTED Final    Comment: CRITICAL RESULT CALLED TO, READ BACK BY AND VERIFIED WITH: PHARMD D rbv steven hurst [phone] FCP    Klebsiella aerogenes NOT DETECTED NOT DETECTED Final   Klebsiella oxytoca NOT DETECTED NOT DETECTED Final   Klebsiella pneumoniae NOT DETECTED NOT DETECTED Final    Proteus species NOT DETECTED NOT DETECTED Final   Salmonella species NOT DETECTED NOT DETECTED Final   Serratia marcescens NOT DETECTED NOT DETECTED Final   Haemophilus influenzae NOT DETECTED NOT DETECTED Final   Neisseria meningitidis NOT DETECTED NOT DETECTED Final   Pseudomonas aeruginosa NOT DETECTED NOT DETECTED Final   Stenotrophomonas maltophilia NOT DETECTED NOT DETECTED Final   Candida albicans NOT DETECTED NOT DETECTED Final   Candida auris NOT DETECTED NOT DETECTED Final   Candida glabrata NOT DETECTED NOT DETECTED Final   Candida krusei NOT DETECTED NOT DETECTED Final   Candida parapsilosis NOT DETECTED NOT DETECTED Final   Candida tropicalis NOT DETECTED NOT DETECTED Final   Cryptococcus neoformans/gattii NOT DETECTED NOT DETECTED Final   CTX-M ESBL NOT DETECTED NOT DETECTED Final   Carbapenem resistance IMP NOT DETECTED NOT DETECTED Final   Carbapenem resistance KPC NOT DETECTED NOT DETECTED Final   Carbapenem resistance NDM NOT DETECTED NOT DETECTED Final   Carbapenem resist OXA 48 LIKE NOT DETECTED NOT DETECTED Final   Carbapenem resistance VIM NOT DETECTED NOT DETECTED Final    Comment: Performed at [facility], [address], [place], [place] [address]  Urine culture     Status: Abnormal (Preliminary result)   Collection Time: 06/15/20  9:12 AM   Specimen: Urine, Catheterized  Result Value Ref Range Status   Specimen Description   Final    URINE, CATHETERIZED Performed at [facility], [address], [place], [place] [address]    Special Requests   Final    NONE Performed at [facility], [address], [place], [place] [address]    Culture (A)  Final    >=100,000 COLONIES/mL KLEBSIELLA PNEUMONIAE >=100,000 COLONIES/mL ESCHERICHIA COLI SUSCEPTIBILITIES TO FOLLOW Performed at [facility], [address] [place], [place] [address]    Report Status PENDING  Incomplete  MRSA PCR Screening     Status: None   Collection Time: 06/15/20  2:03 PM    Specimen: Nasopharyngeal  Result Value Ref Range Status   MRSA by PCR NEGATIVE NEGATIVE Final    Comment:        The GeneXpert MRSA Assay (FDA approved for NASAL specimens only), is one component of a comprehensive MRSA colonization surveillance program. It is not intended to diagnose MRSA infection  nor to guide or monitor treatment for MRSA infections. Performed at [facility], [address], [place], [place] [address]   C Difficile Quick Screen w PCR reflex     Status: Abnormal   Collection Time: 06/16/20  8:58 AM   Specimen: STOOL  Result Value Ref Range Status   C Diff antigen POSITIVE (A) NEGATIVE Final    Comment: CRITICAL RESULT CALLED TO, READ BACK BY AND VERIFIED WITH: LARIMORE A AT 1520 06/16/2020 RC    C Diff toxin POSITIVE (A) NEGATIVE Final    Comment: CRITICAL RESULT CALLED TO, READ BACK BY AND VERIFIED WITH: LARIMORE A AT 1520 06/16/2020 RC    C Diff interpretation Toxin producing C. difficile detected.  Final    Comment: Performed at [facility], [address], [place], [place] [address]  Gastrointestinal Panel by PCR , Stool     Status: Abnormal   Collection Time: 06/16/20  1:53 PM   Specimen: STOOL  Result Value Ref Range Status   Campylobacter species NOT DETECTED NOT DETECTED Final   Plesimonas shigelloides NOT DETECTED NOT DETECTED Final   Salmonella species NOT DETECTED NOT DETECTED Final   Yersinia enterocolitica NOT DETECTED NOT DETECTED Final   Vibrio species NOT DETECTED NOT DETECTED Final   Vibrio cholerae NOT DETECTED NOT DETECTED Final   Enteroaggregative E coli (EAEC) NOT DETECTED NOT DETECTED Final   Enteropathogenic E coli (EPEC) NOT DETECTED NOT DETECTED Final   Enterotoxigenic E coli (ETEC) NOT DETECTED NOT DETECTED Final   Shiga like toxin producing E coli (STEC) NOT DETECTED NOT DETECTED Final   Shigella/Enteroinvasive E coli (EIEC) NOT DETECTED NOT DETECTED Final   Cryptosporidium NOT DETECTED NOT DETECTED Final   Cyclospora  cayetanensis NOT DETECTED NOT DETECTED Final   Entamoeba histolytica NOT DETECTED NOT DETECTED Final   Giardia lamblia NOT DETECTED NOT DETECTED Final   Adenovirus F40/41 DETECTED (A) NOT DETECTED Final   Astrovirus NOT DETECTED NOT DETECTED Final   Norovirus GI/GII NOT DETECTED NOT DETECTED Final   Rotavirus A NOT DETECTED NOT DETECTED Final   Sapovirus (I, II, IV, and V) NOT DETECTED NOT DETECTED Final    Comment: Performed at [facility], [address], [place], [place] [address]     Radiology Studies: No results found.  Scheduled Meds: . acidophilus  1 capsule Oral Daily  . aspirin  81 mg Per Tube Daily  . atorvastatin  20 mg Oral QHS  . Chlorhexidine Gluconate Cloth  6 each Topical Q0600  . ferrous sulfate  325 mg Oral Daily  . finasteride  5 mg Oral Daily  . heparin  5,000 Units Subcutaneous Q8H  . insulin aspart  0-9 Units Subcutaneous TID WC  . linaclotide  145 mcg Oral QAC breakfast  . magnesium oxide  400 mg Oral Daily  . simethicone  80 mg Oral TID  . sodium chloride flush  3 mL Intravenous Q12H  . sodium chloride flush  3 mL Intravenous Q12H  . tamsulosin  0.4 mg Oral Daily  . vancomycin  500 mg Oral Q6H   Continuous Infusions: . sodium chloride    . 0.9 % NaCl with KCl 40 mEq / L Stopped (06/17/20 1836)  . cefTRIAXone (ROCEPHIN)  IV 2 g (06/18/20 1157)  . metronidazole 500 mg (06/18/20 1716)     LOS: 3 days    Time spent: 30 minutes   Vassie Loll, MD [facility]   To contact the attending provider between 7A-7P or the covering provider  during after hours 7P-7A, please log into the web site [email or web address] and access using universal [place] password for that web site. If you do not have the password, please call the hospital operator.  06/18/2020, 6:25 PM

## 2020-06-19 DIAGNOSIS — N401 Enlarged prostate with lower urinary tract symptoms: Secondary | ICD-10-CM | POA: Diagnosis not present

## 2020-06-19 DIAGNOSIS — U071 COVID-19: Secondary | ICD-10-CM | POA: Diagnosis not present

## 2020-06-19 DIAGNOSIS — A4151 Sepsis due to Escherichia coli [E. coli]: Secondary | ICD-10-CM | POA: Diagnosis not present

## 2020-06-19 DIAGNOSIS — I10 Essential (primary) hypertension: Secondary | ICD-10-CM | POA: Diagnosis not present

## 2020-06-19 DIAGNOSIS — N390 Urinary tract infection, site not specified: Secondary | ICD-10-CM | POA: Diagnosis not present

## 2020-06-19 LAB — BASIC METABOLIC PANEL
Anion gap: 7 (ref 5–15)
BUN: 17 mg/dL (ref 8–23)
CO2: 23 mmol/L (ref 22–32)
Calcium: 8 mg/dL — ABNORMAL LOW (ref 8.9–10.3)
Chloride: 111 mmol/L (ref 98–111)
Creatinine, Ser: 0.7 mg/dL (ref 0.61–1.24)
GFR, Estimated: >60 mL/min (ref >60)
Glucose, Bld: 153 mg/dL — ABNORMAL HIGH (ref 70–99)
Potassium: 3.9 mmol/L (ref 3.5–5.1)
Sodium: 141 mmol/L (ref 135–145)

## 2020-06-19 LAB — CBC
HCT: 30.6 % — ABNORMAL LOW (ref 39.0–52.0)
Hemoglobin: 9.5 g/dL — ABNORMAL LOW (ref 13.0–17.0)
MCH: 27.5 pg (ref 26.0–34.0)
MCHC: 31 g/dL (ref 30.0–36.0)
MCV: 88.4 fL (ref 80.0–100.0)
Platelets: 161 10*3/uL (ref 150–400)
RBC: 3.46 MIL/uL — ABNORMAL LOW (ref 4.22–5.81)
RDW: 17.3 % — ABNORMAL HIGH (ref 11.5–15.5)
WBC: 6.5 10*3/uL (ref 4.0–10.5)
nRBC: 0 % (ref 0.0–0.2)

## 2020-06-19 LAB — GLUCOSE, CAPILLARY
Glucose-Capillary: 162 mg/dL — ABNORMAL HIGH (ref 70–99)
Glucose-Capillary: 152 mg/dL — ABNORMAL HIGH (ref 70–99)
Glucose-Capillary: 169 mg/dL — ABNORMAL HIGH (ref 70–99)
Glucose-Capillary: 178 mg/dL — ABNORMAL HIGH (ref 70–99)

## 2020-06-19 LAB — MAGNESIUM: Magnesium: 1.9 mg/dL (ref 1.7–2.4)

## 2020-06-19 MED ORDER — FOSFOMYCIN TROMETHAMINE 3 G PO PACK
3.0000 g | PACK | ORAL | Status: AC
  Administered 2020-06-19 – 2020-06-22 (×2): 3 g via ORAL
  Filled 2020-06-19 (×2): qty 3

## 2020-06-19 NOTE — Progress Notes (Signed)
PROGRESS NOTE    [name]  ZOX:[id number] DOB: 04-Dec-1950 DOA: 06/15/2020 PCP: Pearson Grippe, MD   Chief Complaint  Patient presents with  . Abdominal Pain    Brief admission narrative:  As per H&P written by Dr. Flossie Dibble on 06/15/2020 [name] is a 70 y.o. male with medical history significant of chronic nonverbal at baseline hyperlipidemia, hypertension, chronic renal retention with periodic self catheterization, recurrent UTI, BPH, history of kidney stones, periodic constipation uses laxatives, previous AKI, electrolyte normalities.... History was provided by guardian, electronic records reviewed Presented for abdominal pain-fever x2 days.  Assessment & Plan: 1-sepsis secondary to ESBL E. coli, Klebsiella pneumonia and C. difficile infection -Positive E. coli bacteremia most likely coming from urinary tract. -Patient also experiencing abdominal pain, distention and diarrhea prior to admission; positive C. difficile PCR antigen and toxin. -Continue IV fluids and supportive care -continue holding on laxatives regimen.  -Stop/avoid PPI's -Continue treatment with fosfomycin (starting today), oral vancomycin and IV Flagyl (last 1 for another 24 hours). -Follow culture results and sensitivity. -Continue to follow clinical response.  2-Urine retention -Chronically requiring in and out catheterizations at home -Foley catheter has been placed at time of admission. -Continue Flomax.  3-Intellectual disability/cognitive impairment -Nonverbal at baseline -Able to follow commands -Continue supportive care  4-history of BPH -Continue Flomax. -continue finasteride  5-hypertension: Presented with systolic blood pressure in admission -Continue IV fluid -Continue holding antihypertensive agents.  6-hyperlipidemia -Continue statins.  7-COVID-19 infection -No URI symptoms -Oxygen saturation 92% on room air -Reports patient has received COVID vaccine and booster. -Continue  supportive care -No remdesivir or steroids at this time.  8-hypokalemia/hypomagnesemia -Will replete and continue maintenance supplementation -In the setting of GI losses -Follow electrolytes trend.   DVT prophylaxis: Heparin Code Status: DNR/DNI Communication: No family at bedside. Disposition:   Status is: Inpatient  Dispo: The patient is from: Home              Anticipated d/c is to: Home              Patient currently not medically ready for discharge.  Found positive for sepsis secondary to ESBL E. coli bacteremia and C. Difficile.  Continue current IV Flagyl for another 24 hours; antibiotics for UTI will be transition to fosfomycin.  Continue to follow clinical response/provide further evaluation/management for incomplete ileus.   Difficult to place patient no    Consultants:   None   Procedures:  See below for x-ray reports.   Antimicrobials:  IV Rocephin >>last dose 06/18/20 Fosfomycin  Oral vancomycin IV Flagyl   Subjective: No fever, no chest pain, no nausea or vomiting.  Abdominal distention continues to improve.  Less amount of loose stools appreciated.  Objective: Vitals:   06/18/20 2036 06/19/20 0450 06/19/20 0500 06/19/20 1607  BP: (!) 95/50 100/66  (!) 105/50  Pulse: 86 61  70  Resp: Temp: 98.1 F (36.7 C) 98.1 F (36.7 C)  98.8 F (37.1 C)  TempSrc:    Oral  SpO2: 97% 99%  95%  Weight:   68.4 kg   Height:        Intake/Output Summary (Last 24 hours) at 06/19/2020 1718 Last data filed at 06/19/2020 1504 Gross per 24 hour  Intake 2579.84 ml  Output 800 ml  Net 1779.84 ml   Filed Weights   06/16/20 2335 06/18/20 0500 06/19/20 0500  Weight: 67 kg 66.6 kg 68.4 kg    Examination: General exam: Alert,  awake, following commands appropriately.  Reports no abdominal pain, no nausea, no vomiting.  Patient is afebrile.  A total of 3 loose stools reported in the last 24 hours.  Respiratory system: Good air movement bilaterally; no using  accessory muscle.  Good saturation on room air. Cardiovascular system:RRR. No murmurs, rubs, gallops. Gastrointestinal system: Abdomen is continued to demonstrate improvement in distention; positive bowel sounds appreciated.  No tenderness on palpation.   Central nervous system: No new focal neurological deficits.  Nonverbal at baseline. Extremities: No cyanosis or clubbing. Skin: No petechiae. Psychiatry: Mood & affect appropriate.   Data Reviewed: I have personally reviewed following labs and imaging studies  CBC: Recent Labs  Lab 06/15/20 0858 06/16/20 0421 06/17/20 0605 06/18/20 0549 06/19/20 0555  WBC 4.3 4.9 7.2 9.2 6.5  NEUTROABS 3.7  --   --   --   --   HGB 10.9* 8.6* 8.8* 9.2* 9.5*  HCT 36.1* 27.9* 29.0* 30.6* 30.6*  MCV 89.1 89.4 89.0 89.0 88.4  PLT 156 132* 145* 130* 161    Basic Metabolic Panel: Recent Labs  Lab 06/15/20 0858 06/16/20 0421 06/17/20 0605 06/18/20 0549 06/19/20 0555  NA 134* 133* 136 139 141  K 3.9 3.6 2.6* 3.6 3.9  CL 99 101 102 108 111  CO2 25 26 25  21* 23  GLUCOSE 135* 139* 124* 135* 153*  BUN 17 16 14 17 17   CREATININE 0.83 0.82 0.84 0.73 0.70  CALCIUM 8.8* 7.8* 8.1* 8.0* 8.0*  MG  --   --  1.6* 2.1 1.9    GFR: Estimated Creatinine Clearance: 80.3 mL/min (by C-G formula based on SCr of 0.7 mg/dL).  Liver Function Tests: Recent Labs  Lab 06/15/20 0858  AST 17  ALT 13  ALKPHOS 66  BILITOT 0.7  PROT 7.4  ALBUMIN 3.3*    CBG: Recent Labs  Lab 06/18/20 1615 06/18/20 2035 06/19/20 0819 06/19/20 1249 06/19/20 1609  GLUCAP 153* 236* 162* 152* 169*    Recent Results (from the past 240 hour(s))  Blood Culture (routine x 2)     Status: Abnormal   Collection Time: 06/15/20  8:58 AM   Specimen: BLOOD  Result Value Ref Range Status   Specimen Description   Final    BLOOD BLOOD RIGHT FOREARM Performed at [facility], [address] [place], [place] [address]    Special Requests   Final    Blood Culture adequate volume  BOTTLES DRAWN AEROBIC AND ANAEROBIC Performed at [facility], [address] [place], [place] [address]    Culture  Setup Time   Final    IN BOTH AEROBIC AND ANAEROBIC BOTTLES GRAM NEGATIVE RODS Gram Stain Report Called to,Read Back By and Verified With: R WAGONER,RN@0252  06/16/20 MKELLY CRITICAL VALUE NOTED.  VALUE IS CONSISTENT WITH PREVIOUSLY REPORTED AND CALLED VALUE.    Culture (A)  Final    ESCHERICHIA COLI SUSCEPTIBILITIES PERFORMED ON PREVIOUS CULTURE WITHIN THE LAST 5 DAYS. Performed at [facility], [address] [place], [place] [address]    Report Status 06/18/2020 FINAL  Final  Resp Panel by RT-PCR (Flu A&B, Covid) Nasopharyngeal Swab     Status: Abnormal   Collection Time: 06/15/20  9:08 AM   Specimen: Nasopharyngeal Swab; Nasopharyngeal(NP) swabs in vial transport medium  Result Value Ref Range Status   SARS Coronavirus 2 by RT PCR POSITIVE (A) NEGATIVE Final    Comment: RESULT CALLED TO, READ BACK BY AND VERIFIED WITH: E. GANT 3/2 @1115  S. BEARD (NOTE)  SARS-CoV-2 target nucleic acids are DETECTED.  The SARS-CoV-2 RNA is generally detectable in upper respiratory specimens during the acute phase of infection. Positive results are indicative of the presence of the identified virus, but do not rule out bacterial infection or co-infection with other pathogens not detected by the test. Clinical correlation with patient history and other diagnostic information is necessary to determine patient infection status. The expected result is Negative.  Fact Sheet for Patients: BloggerCourse.com  Fact Sheet for Healthcare Providers: SeriousBroker.it  This test is not yet approved or cleared by the [place] FDA and  has been authorized for detection and/or diagnosis of SARS-CoV-2 by FDA under an Emergency Use Authorization (EUA).  This EUA will remain in effect (meaning this test can be used)  for the duration of   the COVID-19 declaration under Section 564(b)(1) of the Act, 21 U.S.C. section 360bbb-3(b)(1), unless the authorization is terminated or revoked sooner.     Influenza A by PCR NEGATIVE NEGATIVE Final   Influenza B by PCR NEGATIVE NEGATIVE Final    Comment: (NOTE) The Xpert Xpress SARS-CoV-2/FLU/RSV plus assay is intended as an aid in the diagnosis of influenza from Nasopharyngeal swab specimens and should not be used as a sole basis for treatment. Nasal washings and aspirates are unacceptable for Xpert Xpress SARS-CoV-2/FLU/RSV testing.  Fact Sheet for Patients: BloggerCourse.com  Fact Sheet for Healthcare Providers: SeriousBroker.it  This test is not yet approved or cleared by the [place] FDA and has been authorized for detection and/or diagnosis of SARS-CoV-2 by FDA under an Emergency Use Authorization (EUA). This EUA will remain in effect (meaning this test can be used) for the duration of the COVID-19 declaration under Section 564(b)(1) of the Act, 21 U.S.C. section 360bbb-3(b)(1), unless the authorization is terminated or revoked.  Performed at [facility], [address], [place], [place] [address]   Blood Culture (routine x 2)     Status: Abnormal   Collection Time: 06/15/20  9:11 AM   Specimen: BLOOD  Result Value Ref Range Status   Specimen Description   Final    BLOOD BLOOD LEFT WRIST Performed at [facility], [address], [place], [place] [address]    Special Requests   Final    Blood Culture adequate volume BOTTLES DRAWN AEROBIC AND ANAEROBIC Performed at [facility], [address], [place], [place] [address]    Culture  Setup Time   Final    IN BOTH AEROBIC AND ANAEROBIC BOTTLES GRAM NEGATIVE RODS Gram Stain Report Called to,Read Back By and Verified With: R WAGONER,RN  06/16/20 MKELLY CRITICAL RESULT CALLED TO, READ BACK BY AND VERIFIED WITH: PHARMD rbv steven hurst [phone] FCP Performed  at [facility], [address] [place], [place] [address]    Culture ESCHERICHIA COLI (A)  Final   Report Status 06/18/2020 FINAL  Final   Organism ID, Bacteria ESCHERICHIA COLI  Final      Susceptibility   Escherichia coli - MIC*    AMPICILLIN 8 SENSITIVE Sensitive     CEFAZOLIN <=4 SENSITIVE Sensitive     CEFEPIME <=0.12 SENSITIVE Sensitive     CEFTAZIDIME <=1 SENSITIVE Sensitive     CEFTRIAXONE <=0.25 SENSITIVE Sensitive     CIPROFLOXACIN >=4 RESISTANT Resistant     GENTAMICIN <=1 SENSITIVE Sensitive     IMIPENEM 0.5 SENSITIVE Sensitive     TRIMETH/SULFA >=320 RESISTANT Resistant     AMPICILLIN/SULBACTAM 4 SENSITIVE Sensitive     PIP/TAZO <=4 SENSITIVE Sensitive     *  ESCHERICHIA COLI  Blood Culture ID Panel (Reflexed)     Status: Abnormal   Collection Time: 06/15/20  9:11 AM  Result Value Ref Range Status   Enterococcus faecalis NOT DETECTED NOT DETECTED Final   Enterococcus Faecium NOT DETECTED NOT DETECTED Final   Listeria monocytogenes NOT DETECTED NOT DETECTED Final   Staphylococcus species NOT DETECTED NOT DETECTED Final   Staphylococcus aureus (BCID) NOT DETECTED NOT DETECTED Final   Staphylococcus epidermidis NOT DETECTED NOT DETECTED Final   Staphylococcus lugdunensis NOT DETECTED NOT DETECTED Final   Streptococcus species NOT DETECTED NOT DETECTED Final   Streptococcus agalactiae NOT DETECTED NOT DETECTED Final   Streptococcus pneumoniae NOT DETECTED NOT DETECTED Final   Streptococcus pyogenes NOT DETECTED NOT DETECTED Final   A.calcoaceticus-baumannii NOT DETECTED NOT DETECTED Final   Bacteroides fragilis NOT DETECTED NOT DETECTED Final   Enterobacterales DETECTED (A) NOT DETECTED Final    Comment: Enterobacterales represent a large order of gram negative bacteria, not a single organism. CRITICAL RESULT CALLED TO, READ BACK BY AND VERIFIED WITH: PHARMD D rbv steven hurst [phone] FCP    Enterobacter cloacae complex NOT DETECTED NOT DETECTED Final    Escherichia coli DETECTED (A) NOT DETECTED Final    Comment: CRITICAL RESULT CALLED TO, READ BACK BY AND VERIFIED WITH: PHARMD D rbv steven hurst [phone] FCP    Klebsiella aerogenes NOT DETECTED NOT DETECTED Final   Klebsiella oxytoca NOT DETECTED NOT DETECTED Final   Klebsiella pneumoniae NOT DETECTED NOT DETECTED Final   Proteus species NOT DETECTED NOT DETECTED Final   Salmonella species NOT DETECTED NOT DETECTED Final   Serratia marcescens NOT DETECTED NOT DETECTED Final   Haemophilus influenzae NOT DETECTED NOT DETECTED Final   Neisseria meningitidis NOT DETECTED NOT DETECTED Final   Pseudomonas aeruginosa NOT DETECTED NOT DETECTED Final   Stenotrophomonas maltophilia NOT DETECTED NOT DETECTED Final   Candida albicans NOT DETECTED NOT DETECTED Final   Candida auris NOT DETECTED NOT DETECTED Final   Candida glabrata NOT DETECTED NOT DETECTED Final   Candida krusei NOT DETECTED NOT DETECTED Final   Candida parapsilosis NOT DETECTED NOT DETECTED Final   Candida tropicalis NOT DETECTED NOT DETECTED Final   Cryptococcus neoformans/gattii NOT DETECTED NOT DETECTED Final   CTX-M ESBL NOT DETECTED NOT DETECTED Final   Carbapenem resistance IMP NOT DETECTED NOT DETECTED Final   Carbapenem resistance KPC NOT DETECTED NOT DETECTED Final   Carbapenem resistance NDM NOT DETECTED NOT DETECTED Final   Carbapenem resist OXA 48 LIKE NOT DETECTED NOT DETECTED Final   Carbapenem resistance VIM NOT DETECTED NOT DETECTED Final    Comment: Performed at [facility], [address] [place], [place] [address]  Urine culture     Status: Abnormal   Collection Time: 06/15/20  9:12 AM   Specimen: Urine, Catheterized  Result Value Ref Range Status   Specimen Description   Final    URINE, CATHETERIZED Performed at [facility], [address] [place], [place] [address]    Special Requests   Final    NONE Performed at [facility], [address] [place], [place] [address]    Culture (A)   Final    >=100,000 COLONIES/mL KLEBSIELLA PNEUMONIAE >=100,000 COLONIES/mL ESCHERICHIA COLI Confirmed Extended Spectrum Beta-Lactamase Producer (ESBL).  In bloodstream infections from ESBL organisms, carbapenems are preferred over piperacillin/tazobactam. They are shown to have a lower risk of mortality. FOR KLEBSIELLA PNEUMONIAE    Report Status 06/18/2020 FINAL  Final   Organism ID,  Bacteria KLEBSIELLA PNEUMONIAE (A)  Final   Organism ID, Bacteria ESCHERICHIA COLI (A)  Final      Susceptibility   Escherichia coli - MIC*    AMPICILLIN 16 INTERMEDIATE Intermediate     CEFAZOLIN <=4 SENSITIVE Sensitive     CEFEPIME <=0.12 SENSITIVE Sensitive     CEFTRIAXONE <=0.25 SENSITIVE Sensitive     CIPROFLOXACIN >=4 RESISTANT Resistant     GENTAMICIN <=1 SENSITIVE Sensitive     IMIPENEM <=0.25 SENSITIVE Sensitive     NITROFURANTOIN <=16 SENSITIVE Sensitive     TRIMETH/SULFA <=20 SENSITIVE Sensitive     AMPICILLIN/SULBACTAM 4 SENSITIVE Sensitive     PIP/TAZO 64 INTERMEDIATE Intermediate     * >=100,000 COLONIES/mL ESCHERICHIA COLI   Klebsiella pneumoniae - MIC*    AMPICILLIN >=32 RESISTANT Resistant     CEFAZOLIN >=64 RESISTANT Resistant     CEFEPIME >=32 RESISTANT Resistant     CEFTRIAXONE >=64 RESISTANT Resistant     CIPROFLOXACIN >=4 RESISTANT Resistant     GENTAMICIN <=1 SENSITIVE Sensitive     IMIPENEM <=0.25 SENSITIVE Sensitive     NITROFURANTOIN 256 RESISTANT Resistant     TRIMETH/SULFA 80 RESISTANT Resistant     AMPICILLIN/SULBACTAM >=32 RESISTANT Resistant     PIP/TAZO 16 SENSITIVE Sensitive     * >=100,000 COLONIES/mL KLEBSIELLA PNEUMONIAE  MRSA PCR Screening     Status: None   Collection Time: 06/15/20  2:03 PM   Specimen: Nasopharyngeal  Result Value Ref Range Status   MRSA by PCR NEGATIVE NEGATIVE Final    Comment:        The GeneXpert MRSA Assay (FDA approved for NASAL specimens only), is one component of a comprehensive MRSA colonization surveillance program. It is  not intended to diagnose MRSA infection nor to guide or monitor treatment for MRSA infections. Performed at [facility], [address] [place], [place] [address]   C Difficile Quick Screen w PCR reflex     Status: Abnormal   Collection Time: 06/16/20  8:58 AM   Specimen: STOOL  Result Value Ref Range Status   C Diff antigen POSITIVE (A) NEGATIVE Final    Comment: CRITICAL RESULT CALLED TO, READ BACK BY AND VERIFIED WITH: LARIMORE A AT 1520 06/16/2020 RC    C Diff toxin POSITIVE (A) NEGATIVE Final    Comment: CRITICAL RESULT CALLED TO, READ BACK BY AND VERIFIED WITH: LARIMORE A AT 1520 06/16/2020 RC    C Diff interpretation Toxin producing C. difficile detected.  Final    Comment: Performed at [facility], [address], [place], [place] [address]  Gastrointestinal Panel by PCR , Stool     Status: Abnormal   Collection Time: 06/16/20  1:53 PM   Specimen: STOOL  Result Value Ref Range Status   Campylobacter species NOT DETECTED NOT DETECTED Final   Plesimonas shigelloides NOT DETECTED NOT DETECTED Final   Salmonella species NOT DETECTED NOT DETECTED Final   Yersinia enterocolitica NOT DETECTED NOT DETECTED Final   Vibrio species NOT DETECTED NOT DETECTED Final   Vibrio cholerae NOT DETECTED NOT DETECTED Final   Enteroaggregative E coli (EAEC) NOT DETECTED NOT DETECTED Final   Enteropathogenic E coli (EPEC) NOT DETECTED NOT DETECTED Final   Enterotoxigenic E coli (ETEC) NOT DETECTED NOT DETECTED Final   Shiga like toxin producing E coli (STEC) NOT DETECTED NOT DETECTED Final   Shigella/Enteroinvasive E coli (EIEC) NOT DETECTED NOT DETECTED Final   Cryptosporidium NOT DETECTED NOT DETECTED Final   Cyclospora cayetanensis NOT DETECTED NOT  DETECTED Final   Entamoeba histolytica NOT DETECTED NOT DETECTED Final   Giardia lamblia NOT DETECTED NOT DETECTED Final   Adenovirus F40/41 DETECTED (A) NOT DETECTED Final   Astrovirus NOT DETECTED NOT DETECTED Final   Norovirus GI/GII NOT  DETECTED NOT DETECTED Final   Rotavirus A NOT DETECTED NOT DETECTED Final   Sapovirus (I, II, IV, and V) NOT DETECTED NOT DETECTED Final    Comment: Performed at [facility], [address], [place], [place] [address]     Radiology Studies: No results found.  Scheduled Meds: . acidophilus  1 capsule Oral Daily  . aspirin  81 mg Per Tube Daily  . atorvastatin  20 mg Oral QHS  . Chlorhexidine Gluconate Cloth  6 each Topical Q0600  . ferrous sulfate  325 mg Oral Daily  . finasteride  5 mg Oral Daily  . fosfomycin  3 g Oral Q72H  . heparin  5,000 Units Subcutaneous Q8H  . insulin aspart  0-9 Units Subcutaneous TID WC  . linaclotide  145 mcg Oral QAC breakfast  . magnesium oxide  400 mg Oral Daily  . simethicone  80 mg Oral TID  . sodium chloride flush  3 mL Intravenous Q12H  . sodium chloride flush  3 mL Intravenous Q12H  . tamsulosin  0.4 mg Oral Daily  . vancomycin  500 mg Oral Q6H   Continuous Infusions: . sodium chloride    . 0.9 % NaCl with KCl 40 mEq / L 75 mL/hr at 06/19/20 1414  . cefTRIAXone (ROCEPHIN)  IV 2 g (06/19/20 1040)  . metronidazole 500 mg (06/19/20 1710)     LOS: 4 days    Time spent: 30 minutes   Vassie Loll, MD [facility]   To contact the attending provider between 7A-7P or the covering provider during after hours 7P-7A, please log into the web site [email or web address] and access using universal [place] password for that web site. If you do not have the password, please call the hospital operator.  06/19/2020, 5:18 PM

## 2020-06-20 DIAGNOSIS — F79 Unspecified intellectual disabilities: Secondary | ICD-10-CM

## 2020-06-20 DIAGNOSIS — N390 Urinary tract infection, site not specified: Secondary | ICD-10-CM | POA: Diagnosis not present

## 2020-06-20 DIAGNOSIS — A498 Other bacterial infections of unspecified site: Secondary | ICD-10-CM

## 2020-06-20 DIAGNOSIS — B9620 Unspecified Escherichia coli [E. coli] as the cause of diseases classified elsewhere: Secondary | ICD-10-CM

## 2020-06-20 DIAGNOSIS — R4701 Aphasia: Secondary | ICD-10-CM

## 2020-06-20 DIAGNOSIS — R140 Abdominal distension (gaseous): Secondary | ICD-10-CM | POA: Diagnosis not present

## 2020-06-20 DIAGNOSIS — R7881 Bacteremia: Secondary | ICD-10-CM

## 2020-06-20 DIAGNOSIS — U071 COVID-19: Secondary | ICD-10-CM | POA: Diagnosis not present

## 2020-06-20 DIAGNOSIS — A4151 Sepsis due to Escherichia coli [E. coli]: Secondary | ICD-10-CM | POA: Diagnosis not present

## 2020-06-20 DIAGNOSIS — N401 Enlarged prostate with lower urinary tract symptoms: Secondary | ICD-10-CM | POA: Diagnosis not present

## 2020-06-20 DIAGNOSIS — R338 Other retention of urine: Secondary | ICD-10-CM

## 2020-06-20 LAB — GLUCOSE, CAPILLARY
Glucose-Capillary: 142 mg/dL — ABNORMAL HIGH (ref 70–99)
Glucose-Capillary: 227 mg/dL — ABNORMAL HIGH (ref 70–99)
Glucose-Capillary: 173 mg/dL — ABNORMAL HIGH (ref 70–99)
Glucose-Capillary: 194 mg/dL — ABNORMAL HIGH (ref 70–99)

## 2020-06-20 MED ORDER — VANCOMYCIN 50 MG/ML ORAL SOLUTION
125.0000 mg | Freq: Four times a day (QID) | ORAL | Status: DC
  Administered 2020-06-20 – 2020-06-25 (×21): 125 mg via ORAL
  Filled 2020-06-20 (×29): qty 2.5

## 2020-06-20 NOTE — Progress Notes (Signed)
Physical Therapy Treatment Patient Details Name: [name] MRN: [id number] DOB: [birth year] Today's Date: 06/20/2020    History of Present Illness [name] is a 70 y.o. male with medical history significant of chronic nonverbal at baseline hyperlipidemia, hypertension, chronic renal retention with periodic self catheterization, recurrent UTI, BPH, history of kidney stones, periodic constipation uses laxatives, previous AKI, electrolyte normalities....  History was provided by guardian, electronic records reviewed  Presented for abdominal pain-fever x2 days.    PT Comments    Patient demonstrates increased strength for extending trunk during sit to stands and when standing with RW, tolerated taking a few steps and standing at bedside for up to 3-4 minutes before having to sit due to fatigue.  Patient tolerated sitting up in chair after therapy - RN aware.  Patient will benefit from continued physical therapy in hospital and recommended venue below to increase strength, balance, endurance for safe ADLs and gait.    Follow Up Recommendations  SNF;Supervision for mobility/OOB;Supervision - Intermittent     Equipment Recommendations  None recommended by PT    Recommendations for Other Services       Precautions / Restrictions Precautions Precautions: Fall Restrictions Weight Bearing Restrictions: No    Mobility  Bed Mobility Overal bed mobility: Needs Assistance Bed Mobility: Supine to Sit Rolling: Min assist   Supine to sit: Min assist Sit to supine: Min assist;Mod assist   General bed mobility comments: increased time, labored movement, had to use bed rail    Transfers Overall transfer level: Needs assistance Equipment used: Rolling walker (2 wheeled) Transfers: Sit to/from [employer] Sit to Stand: Min assist Stand pivot transfers: Min assist;Mod assist       General transfer comment: slow labored movement  Ambulation/Gait Ambulation/Gait  assistance: Mod assist Gait Distance (Feet): 5 Feet Assistive device: Rolling walker (2 wheeled) Gait Pattern/deviations: Decreased step length - right;Decreased step length - left;Decreased stride length;Trunk flexed Gait velocity: slow   General Gait Details: limited to 5-6 slow labored steps at bedside with improvement for extending trunk with verbal/tactile cueing, limited mostly due to fatigue   Stairs             Wheelchair Mobility    Modified Rankin (Stroke Patients Only)       Balance Overall balance assessment: Needs assistance Sitting-balance support: Feet supported;No upper extremity supported Sitting balance-[name]: Good Sitting balance - Comments: EOB   Standing balance support: During functional activity;Bilateral upper extremity supported Standing balance-[name]: Poor Standing balance comment: fair/poor using RW                            Cognition Arousal/Alertness: Awake/alert Behavior During Therapy: WFL for tasks assessed/performed Overall Cognitive Status: Within Functional Limits for tasks assessed                                 General Comments: Non-verbal but able to follow 1 step commands with mild inconsistency.      Exercises General Exercises - Lower Extremity Ankle Circles/Pumps: Seated;AROM;Strengthening;Both;10 reps Long Arc Quad: Seated;AROM;Strengthening;Both;10 reps Hip Flexion/Marching: Seated;AROM;Strengthening;Both;10 reps    General Comments        Pertinent Vitals/Pain Pain Assessment: Faces Faces Pain Scale: Hurts a little bit Pain Location: over buttocks Pain Descriptors / Indicators: Grimacing;Sore Pain Intervention(s): Limited activity within patient's tolerance;Monitored during session;Repositioned    Home Living  Prior Function            PT Goals (current goals can now be found in the care plan section) Acute Rehab PT Goals Patient Stated  Goal: not stated (non verbal) PT Goal Formulation: With patient Time For Goal Achievement: 06/30/20 Potential to Achieve Goals: Good Progress towards PT goals: Progressing toward goals    Frequency    Min 3X/week      PT Plan Current plan remains appropriate    Co-evaluation              AM-PAC PT "6 Clicks" Mobility   Outcome Measure  Help needed turning from your back to your side while in a flat bed without using bedrails?: A Little Help needed moving from lying on your back to sitting on the side of a flat bed without using bedrails?: A Little Help needed moving to and from a bed to a chair (including a wheelchair)?: A Lot Help needed standing up from a chair using your arms (e.g., wheelchair or bedside chair)?: A Lot Help needed to walk in hospital room?: A Lot Help needed climbing 3-5 steps with a railing? : Total 6 Click Score: 13    End of Session   Activity Tolerance: Patient tolerated treatment well;Patient limited by fatigue Patient left: in chair;with call bell/phone within reach;with chair alarm set Nurse Communication: Mobility status PT Visit Diagnosis: Unsteadiness on feet (R26.81);Other abnormalities of gait and mobility (R26.89);Muscle weakness (generalized) (M62.81)     Time: 2706-2376 PT Time Calculation (min) (ACUTE ONLY): 26 min  Charges:  $Therapeutic Exercise: 8-22 mins $Therapeutic Activity: 8-22 mins                     12:27 PM, 06/20/20 [name], MPT Physical Therapist with [facility] [phone] office [phone] mobile phone

## 2020-06-20 NOTE — Progress Notes (Signed)
PROGRESS NOTE    [name]  AVW:[id number] DOB: 1951/01/02 DOA: 06/15/2020 PCP: Pearson Grippe, MD   Chief Complaint  Patient presents with  . Abdominal Pain    Brief admission narrative:  As per H&P written by Dr. Flossie Dibble on 06/15/2020 [name] is a 70 y.o. male with medical history significant of chronic nonverbal at baseline hyperlipidemia, hypertension, chronic renal retention with periodic self catheterization, recurrent UTI, BPH, history of kidney stones, periodic constipation uses laxatives, previous AKI, electrolyte normalities.... History was provided by guardian, electronic records reviewed Presented for abdominal pain-fever x2 days.  Assessment & Plan: 1-sepsis secondary to ESBL E. coli, Klebsiella pneumonia and C. difficile infection -Positive E. coli bacteremia most likely coming from urinary tract. -Patient also experiencing abdominal pain, distention and diarrhea prior to admission; positive C. difficile PCR antigen and toxin. -Continue IV fluids and supportive care -continue holding on laxatives regimen.  -Stop/avoid PPI's -repeat fosfomycin in 48 hours; continue oral vancomycin. -continue rocephin -Follow culture results and sensitivity. -Continue to follow clinical response.  2-Urine retention -Chronically requiring in and out catheterizations at home -Foley catheter has been placed at time of admission. -Continue Flomax.  3-Intellectual disability/cognitive impairment -Nonverbal at baseline -Able to follow commands -Continue supportive care -plan is for return home with family care when medically stable (1/2 more days)  4-history of BPH -Continue Flomax. -continue finasteride -foley catheter in place secondary to retention   5-hypertension: Presented with systolic blood pressure in admission -Continue IV fluid -Continue holding antihypertensive agents.  6-hyperlipidemia -Continue statins.  7-COVID-19 infection -No URI symptoms -Oxygen  saturation 92% on room air -Reports patient has received COVID vaccine and booster. -Continue supportive care -No remdesivir or steroids at this time.  8-hypokalemia/hypomagnesemia -Will replete and continue maintenance supplementation -In the setting of GI losses -continue to Follow electrolytes trend.   DVT prophylaxis: Heparin Code Status: DNR/DNI Communication: No family at bedside. Disposition:   Status is: Inpatient  Dispo: The patient is from: Home              Anticipated d/c is to: Home              Patient currently not medically ready for discharge.  Found positive for sepsis secondary to ESBL E. coli bacteremia and C. Difficile.  Antibiotics for UTI transition to fosfomycin, so far well tolerated.  Continue to follow clinical response/provide further evaluation/management for  ileus.   Difficult to place patient no    Consultants:   None   Procedures:  See below for x-ray reports.   Antimicrobials:  IV Rocephin >>last dose 06/18/20 Fosfomycin anticipated 2 doses 2 hours apart. Oral vancomycin IV Flagyl last dose 06/18/20   Subjective: No fever, no chest pain, no nausea, no vomiting, no shortness of breath.  Improvement in the amount of loose stools episodes reported; good appetite and diet tolerance.  Objective: Vitals:   06/19/20 1607 06/19/20 2042 06/20/20 0500 06/20/20 0610  BP: (!) 105/50 (!) 108/51  (!) 106/50  Pulse: 70 81  60  Resp: 16 16  18   Temp: 98.8 F (37.1 C) 97.6 F (36.4 C)  98 F (36.7 C)  TempSrc: Oral Oral  Oral  SpO2: 95% 98%  96%  Weight:   71.8 kg   Height:        Intake/Output Summary (Last 24 hours) at 06/20/2020 1808 Last data filed at 06/20/2020 1803 Gross per 24 hour  Intake 1741.6 ml  Output 1750 ml  Net -8.4 ml  Filed Weights   06/18/20 0500 06/19/20 0500 06/20/20 0500  Weight: 66.6 kg 68.4 kg 71.8 kg    Examination: General exam: Alert, awake, following commands appropriately and nodding to communicate.   Nonverbal at baseline.  No fever, no nausea, no vomiting, demonstrating good appetite.  Continues to have improvement in the amount of loose stools episodes.  No overnight events. Respiratory system: Clear to auscultation. Respiratory effort normal.  No using accessory muscles.  Good saturation on room air Cardiovascular system:RRR. No murmurs, rubs, gallops.  No JVD Gastrointestinal system: Abdomen is mildly distended, soft and nontender. No organomegaly or masses felt. Normal bowel sounds heard. Central nervous system: No new focal neurological deficits. Extremities: No cyanosis or clubbing Skin: No petechiae. Psychiatry:Mood & affect appropriate.    Data Reviewed: I have personally reviewed following labs and imaging studies  CBC: Recent Labs  Lab 06/15/20 0858 06/16/20 0421 06/17/20 0605 06/18/20 0549 06/19/20 0555  WBC 4.3 4.9 7.2 9.2 6.5  NEUTROABS 3.7  --   --   --   --   HGB 10.9* 8.6* 8.8* 9.2* 9.5*  HCT 36.1* 27.9* 29.0* 30.6* 30.6*  MCV 89.1 89.4 89.0 89.0 88.4  PLT 156 132* 145* 130* 161    Basic Metabolic Panel: Recent Labs  Lab 06/15/20 0858 06/16/20 0421 06/17/20 0605 06/18/20 0549 06/19/20 0555  NA 134* 133* 136 139 141  K 3.9 3.6 2.6* 3.6 3.9  CL 99 101 102 108 111  CO2 25 26 25  21* 23  GLUCOSE 135* 139* 124* 135* 153*  BUN 17 16 14 17 17   CREATININE 0.83 0.82 0.84 0.73 0.70  CALCIUM 8.8* 7.8* 8.1* 8.0* 8.0*  MG  --   --  1.6* 2.1 1.9    GFR: Estimated Creatinine Clearance: 80.3 mL/min (by C-G formula based on SCr of 0.7 mg/dL).  Liver Function Tests: Recent Labs  Lab 06/15/20 0858  AST 17  ALT 13  ALKPHOS 66  BILITOT 0.7  PROT 7.4  ALBUMIN 3.3*    CBG: Recent Labs  Lab 06/19/20 1609 06/19/20 2039 06/20/20 0750 06/20/20 1122 06/20/20 1634  GLUCAP 169* 178* 142* 227* 173*    Recent Results (from the past 240 hour(s))  Blood Culture (routine x 2)     Status: Abnormal   Collection Time: 06/15/20  8:58 AM   Specimen: BLOOD   Result Value Ref Range Status   Specimen Description   Final    BLOOD BLOOD RIGHT FOREARM Performed at [facility], [address] [place], [address] [place]    Special Requests   Final    Blood Culture adequate volume BOTTLES DRAWN AEROBIC AND ANAEROBIC Performed at [facility], [address], [place], [address] [place]    Culture  Setup Time   Final    IN BOTH AEROBIC AND ANAEROBIC BOTTLES GRAM NEGATIVE RODS Gram Stain Report Called to,Read Back By and Verified With: R WAGONER,RN@0252  06/16/20 MKELLY CRITICAL VALUE NOTED.  VALUE IS CONSISTENT WITH PREVIOUSLY REPORTED AND CALLED VALUE.    Culture (A)  Final    ESCHERICHIA COLI SUSCEPTIBILITIES PERFORMED ON PREVIOUS CULTURE WITHIN THE LAST 5 DAYS. Performed at [facility], [address] [place], [address] [place]    Report Status 06/18/2020 FINAL  Final  Resp Panel by RT-PCR (Flu A&B, Covid) Nasopharyngeal Swab     Status: Abnormal   Collection Time: 06/15/20  9:08 AM   Specimen: Nasopharyngeal Swab; Nasopharyngeal(NP) swabs in vial transport medium  Result Value Ref Range Status  SARS Coronavirus 2 by RT PCR POSITIVE (A) NEGATIVE Final    Comment: RESULT CALLED TO, READ BACK BY AND VERIFIED WITH: E. GANT 3/2  S. BEARD (NOTE) SARS-CoV-2 target nucleic acids are DETECTED.  The SARS-CoV-2 RNA is generally detectable in upper respiratory specimens during the acute phase of infection. Positive results are indicative of the presence of the identified virus, but do not rule out bacterial infection or co-infection with other pathogens not detected by the test. Clinical correlation with patient history and other diagnostic information is necessary to determine patient infection status. The expected result is Negative.  Fact Sheet for Patients: BloggerCourse.com  Fact Sheet for Healthcare Providers: SeriousBroker.it  This test is not yet approved or cleared by the  [place] FDA and  has been authorized for detection and/or diagnosis of SARS-CoV-2 by FDA under an Emergency Use Authorization (EUA).  This EUA will remain in effect (meaning this test can be used)  for the duration of  the COVID-19 declaration under Section 564(b)(1) of the Act, 21 U.S.C. section 360bbb-3(b)(1), unless the authorization is terminated or revoked sooner.     Influenza A by PCR NEGATIVE NEGATIVE Final   Influenza B by PCR NEGATIVE NEGATIVE Final    Comment: (NOTE) The Xpert Xpress SARS-CoV-2/FLU/RSV plus assay is intended as an aid in the diagnosis of influenza from Nasopharyngeal swab specimens and should not be used as a sole basis for treatment. Nasal washings and aspirates are unacceptable for Xpert Xpress SARS-CoV-2/FLU/RSV testing.  Fact Sheet for Patients: BloggerCourse.com  Fact Sheet for Healthcare Providers: SeriousBroker.it  This test is not yet approved or cleared by the [place] FDA and has been authorized for detection and/or diagnosis of SARS-CoV-2 by FDA under an Emergency Use Authorization (EUA). This EUA will remain in effect (meaning this test can be used) for the duration of the COVID-19 declaration under Section 564(b)(1) of the Act, 21 U.S.C. section 360bbb-3(b)(1), unless the authorization is terminated or revoked.  Performed at [facility], [address] [place], [place] [address]   Blood Culture (routine x 2)     Status: Abnormal   Collection Time: 06/15/20  9:11 AM   Specimen: BLOOD  Result Value Ref Range Status   Specimen Description   Final    BLOOD BLOOD LEFT WRIST Performed at [facility], [address], [place], [place] [address]    Special Requests   Final    Blood Culture adequate volume BOTTLES DRAWN AEROBIC AND ANAEROBIC Performed at [facility], [address] [place], [place] [address]    Culture  Setup Time   Final    IN BOTH AEROBIC AND ANAEROBIC  BOTTLES GRAM NEGATIVE RODS Gram Stain Report Called to,Read Back By and Verified With: R WAGONER,RN  06/16/20 MKELLY CRITICAL RESULT CALLED TO, READ BACK BY AND VERIFIED WITH: PHARMD rbv steven hurst [phone] FCP Performed at [facility], [address], [place], [place] [address]    Culture ESCHERICHIA COLI (A)  Final   Report Status 06/18/2020 FINAL  Final   Organism ID, Bacteria ESCHERICHIA COLI  Final      Susceptibility   Escherichia coli - MIC*    AMPICILLIN 8 SENSITIVE Sensitive     CEFAZOLIN <=4 SENSITIVE Sensitive     CEFEPIME <=0.12 SENSITIVE Sensitive     CEFTAZIDIME <=1 SENSITIVE Sensitive     CEFTRIAXONE <=0.25 SENSITIVE Sensitive     CIPROFLOXACIN >=4 RESISTANT Resistant     GENTAMICIN <=1 SENSITIVE Sensitive  IMIPENEM 0.5 SENSITIVE Sensitive     TRIMETH/SULFA >=320 RESISTANT Resistant     AMPICILLIN/SULBACTAM 4 SENSITIVE Sensitive     PIP/TAZO <=4 SENSITIVE Sensitive     * ESCHERICHIA COLI  Blood Culture ID Panel (Reflexed)     Status: Abnormal   Collection Time: 06/15/20  9:11 AM  Result Value Ref Range Status   Enterococcus faecalis NOT DETECTED NOT DETECTED Final   Enterococcus Faecium NOT DETECTED NOT DETECTED Final   Listeria monocytogenes NOT DETECTED NOT DETECTED Final   Staphylococcus species NOT DETECTED NOT DETECTED Final   Staphylococcus aureus (BCID) NOT DETECTED NOT DETECTED Final   Staphylococcus epidermidis NOT DETECTED NOT DETECTED Final   Staphylococcus lugdunensis NOT DETECTED NOT DETECTED Final   Streptococcus species NOT DETECTED NOT DETECTED Final   Streptococcus agalactiae NOT DETECTED NOT DETECTED Final   Streptococcus pneumoniae NOT DETECTED NOT DETECTED Final   Streptococcus pyogenes NOT DETECTED NOT DETECTED Final   A.calcoaceticus-baumannii NOT DETECTED NOT DETECTED Final   Bacteroides fragilis NOT DETECTED NOT DETECTED Final   Enterobacterales DETECTED (A) NOT DETECTED Final    Comment: Enterobacterales represent a  large order of gram negative bacteria, not a single organism. CRITICAL RESULT CALLED TO, READ BACK BY AND VERIFIED WITH: PHARMD D rbv steven hurst [id number] FCP    Enterobacter cloacae complex NOT DETECTED NOT DETECTED Final   Escherichia coli DETECTED (A) NOT DETECTED Final    Comment: CRITICAL RESULT CALLED TO, READ BACK BY AND VERIFIED WITH: PHARMD D rbv steven hurst [id number] FCP    Klebsiella aerogenes NOT DETECTED NOT DETECTED Final   Klebsiella oxytoca NOT DETECTED NOT DETECTED Final   Klebsiella pneumoniae NOT DETECTED NOT DETECTED Final   Proteus species NOT DETECTED NOT DETECTED Final   Salmonella species NOT DETECTED NOT DETECTED Final   Serratia marcescens NOT DETECTED NOT DETECTED Final   Haemophilus influenzae NOT DETECTED NOT DETECTED Final   Neisseria meningitidis NOT DETECTED NOT DETECTED Final   Pseudomonas aeruginosa NOT DETECTED NOT DETECTED Final   Stenotrophomonas maltophilia NOT DETECTED NOT DETECTED Final   Candida albicans NOT DETECTED NOT DETECTED Final   Candida auris NOT DETECTED NOT DETECTED Final   Candida glabrata NOT DETECTED NOT DETECTED Final   Candida krusei NOT DETECTED NOT DETECTED Final   Candida parapsilosis NOT DETECTED NOT DETECTED Final   Candida tropicalis NOT DETECTED NOT DETECTED Final   Cryptococcus neoformans/gattii NOT DETECTED NOT DETECTED Final   CTX-M ESBL NOT DETECTED NOT DETECTED Final   Carbapenem resistance IMP NOT DETECTED NOT DETECTED Final   Carbapenem resistance KPC NOT DETECTED NOT DETECTED Final   Carbapenem resistance NDM NOT DETECTED NOT DETECTED Final   Carbapenem resist OXA 48 LIKE NOT DETECTED NOT DETECTED Final   Carbapenem resistance VIM NOT DETECTED NOT DETECTED Final    Comment: Performed at [facility], [address] [place], [place] [address]  Urine culture     Status: Abnormal   Collection Time: 06/15/20  9:12 AM   Specimen: Urine, Catheterized  Result Value Ref Range Status   Specimen  Description   Final    URINE, CATHETERIZED Performed at [facility], [address] [place], [place] [address]    Special Requests   Final    NONE Performed at [facility], [address] [place], [place] [address]    Culture (A)  Final    >=100,000 COLONIES/mL KLEBSIELLA PNEUMONIAE >=100,000 COLONIES/mL ESCHERICHIA COLI Confirmed Extended Spectrum Beta-Lactamase Producer (ESBL).  In bloodstream infections from  ESBL organisms, carbapenems are preferred over piperacillin/tazobactam. They are shown to have a lower risk of mortality. FOR KLEBSIELLA PNEUMONIAE    Report Status 06/18/2020 FINAL  Final   Organism ID, Bacteria KLEBSIELLA PNEUMONIAE (A)  Final   Organism ID, Bacteria ESCHERICHIA COLI (A)  Final      Susceptibility   Escherichia coli - MIC*    AMPICILLIN 16 INTERMEDIATE Intermediate     CEFAZOLIN <=4 SENSITIVE Sensitive     CEFEPIME <=0.12 SENSITIVE Sensitive     CEFTRIAXONE <=0.25 SENSITIVE Sensitive     CIPROFLOXACIN >=4 RESISTANT Resistant     GENTAMICIN <=1 SENSITIVE Sensitive     IMIPENEM <=0.25 SENSITIVE Sensitive     NITROFURANTOIN <=16 SENSITIVE Sensitive     TRIMETH/SULFA <=20 SENSITIVE Sensitive     AMPICILLIN/SULBACTAM 4 SENSITIVE Sensitive     PIP/TAZO 64 INTERMEDIATE Intermediate     * >=100,000 COLONIES/mL ESCHERICHIA COLI   Klebsiella pneumoniae - MIC*    AMPICILLIN >=32 RESISTANT Resistant     CEFAZOLIN >=64 RESISTANT Resistant     CEFEPIME >=32 RESISTANT Resistant     CEFTRIAXONE >=64 RESISTANT Resistant     CIPROFLOXACIN >=4 RESISTANT Resistant     GENTAMICIN <=1 SENSITIVE Sensitive     IMIPENEM <=0.25 SENSITIVE Sensitive     NITROFURANTOIN 256 RESISTANT Resistant     TRIMETH/SULFA 80 RESISTANT Resistant     AMPICILLIN/SULBACTAM >=32 RESISTANT Resistant     PIP/TAZO 16 SENSITIVE Sensitive     * >=100,000 COLONIES/mL KLEBSIELLA PNEUMONIAE  MRSA PCR Screening     Status: None   Collection Time: 06/15/20  2:03 PM   Specimen: Nasopharyngeal   Result Value Ref Range Status   MRSA by PCR NEGATIVE NEGATIVE Final    Comment:        The GeneXpert MRSA Assay (FDA approved for NASAL specimens only), is one component of a comprehensive MRSA colonization surveillance program. It is not intended to diagnose MRSA infection nor to guide or monitor treatment for MRSA infections. Performed at [facility], [address] [place], [place] [address]   C Difficile Quick Screen w PCR reflex     Status: Abnormal   Collection Time: 06/16/20  8:58 AM   Specimen: STOOL  Result Value Ref Range Status   C Diff antigen POSITIVE (A) NEGATIVE Final    Comment: CRITICAL RESULT CALLED TO, READ BACK BY AND VERIFIED WITH: LARIMORE A AT 1520 06/16/2020 RC    C Diff toxin POSITIVE (A) NEGATIVE Final    Comment: CRITICAL RESULT CALLED TO, READ BACK BY AND VERIFIED WITH: LARIMORE A AT 1520 06/16/2020 RC    C Diff interpretation Toxin producing C. difficile detected.  Final    Comment: Performed at [facility], [address] [place], [place] [address]  Gastrointestinal Panel by PCR , Stool     Status: Abnormal   Collection Time: 06/16/20  1:53 PM   Specimen: STOOL  Result Value Ref Range Status   Campylobacter species NOT DETECTED NOT DETECTED Final   Plesimonas shigelloides NOT DETECTED NOT DETECTED Final   Salmonella species NOT DETECTED NOT DETECTED Final   Yersinia enterocolitica NOT DETECTED NOT DETECTED Final   Vibrio species NOT DETECTED NOT DETECTED Final   Vibrio cholerae NOT DETECTED NOT DETECTED Final   Enteroaggregative E coli (EAEC) NOT DETECTED NOT DETECTED Final   Enteropathogenic E coli (EPEC) NOT DETECTED NOT DETECTED Final   Enterotoxigenic E coli (ETEC) NOT DETECTED NOT DETECTED Final   Shiga like toxin producing E  coli (STEC) NOT DETECTED NOT DETECTED Final   Shigella/Enteroinvasive E coli (EIEC) NOT DETECTED NOT DETECTED Final   Cryptosporidium NOT DETECTED NOT DETECTED Final   Cyclospora cayetanensis NOT DETECTED NOT  DETECTED Final   Entamoeba histolytica NOT DETECTED NOT DETECTED Final   Giardia lamblia NOT DETECTED NOT DETECTED Final   Adenovirus F40/41 DETECTED (A) NOT DETECTED Final   Astrovirus NOT DETECTED NOT DETECTED Final   Norovirus GI/GII NOT DETECTED NOT DETECTED Final   Rotavirus A NOT DETECTED NOT DETECTED Final   Sapovirus (I, II, IV, and V) NOT DETECTED NOT DETECTED Final    Comment: Performed at [facility], [address], [place], [place] [address]     Radiology Studies: No results found.  Scheduled Meds: . acidophilus  1 capsule Oral Daily  . aspirin  81 mg Per Tube Daily  . atorvastatin  20 mg Oral QHS  . Chlorhexidine Gluconate Cloth  6 each Topical Q0600  . ferrous sulfate  325 mg Oral Daily  . finasteride  5 mg Oral Daily  . fosfomycin  3 g Oral Q72H  . heparin  5,000 Units Subcutaneous Q8H  . insulin aspart  0-9 Units Subcutaneous TID WC  . linaclotide  145 mcg Oral QAC breakfast  . magnesium oxide  400 mg Oral Daily  . simethicone  80 mg Oral TID  . sodium chloride flush  3 mL Intravenous Q12H  . sodium chloride flush  3 mL Intravenous Q12H  . tamsulosin  0.4 mg Oral Daily  . vancomycin  125 mg Oral Q6H   Continuous Infusions: . sodium chloride    . 0.9 % NaCl with KCl 40 mEq / L 75 mL/hr at 06/20/20 0542  . cefTRIAXone (ROCEPHIN)  IV 2 g (06/20/20 1139)     LOS: 5 days    Time spent: 30 minutes   Vassie Loll, MD [facility]   To contact the attending provider between 7A-7P or the covering provider during after hours 7P-7A, please log into the web site [email or web address] and access using universal [place] password for that web site. If you do not have the password, please call the hospital operator.  06/20/2020, 6:08 PM

## 2020-06-20 NOTE — Progress Notes (Signed)
Occupational Therapy Treatment Patient Details Name: [name] MRN: [id number] DOB: 1950/08/18 Today's Date: 06/20/2020    History of present illness [name] is a 70 y.o. male with medical history significant of chronic nonverbal at baseline hyperlipidemia, hypertension, chronic renal retention with periodic self catheterization, recurrent UTI, BPH, history of kidney stones, periodic constipation uses laxatives, previous AKI, electrolyte normalities....  History was provided by guardian, electronic records reviewed  Presented for abdominal pain-fever x2 days.   OT comments  Pt tolerated treatment well. Able to complete ~4 reps of sit to stand with minimal to moderate assist using gait belt and RW. Total assist for bowel hygiene supine in bed. Minimal assist for bed mobility supine to sit. Minimal to moderate for sit to supine. Pt will benefit from continued OT services to address functional strength and endurance with d/c to the below venues depending on caregiver support.   Follow Up Recommendations  Home health OT;Supervision/Assistance - 24 hour;SNF    Equipment Recommendations  None recommended by OT          Precautions / Restrictions Precautions Precautions: Fall Restrictions Weight Bearing Restrictions: No       Mobility Bed Mobility Overal bed mobility: Needs Assistance Bed Mobility: Supine to Sit;Sit to Supine;Rolling Rolling: Min assist   Supine to sit: Min assist Sit to supine: Min assist;Mod assist   General bed mobility comments: increased time, labored movement    Transfers Overall transfer level: Needs assistance Equipment used: Rolling walker (2 wheeled) Transfers: Sit to/from Stand Sit to Stand: Min assist;Mod assist         General transfer comment: Completed ~4 sit to stand from bed with minimal to moderate assist using RW.    Balance Overall balance assessment: Needs assistance Sitting-balance support: Feet supported;No upper  extremity supported Sitting balance-[name]: Good Sitting balance - Comments: EOB   Standing balance support: During functional activity;Bilateral upper extremity supported Standing balance-[name]: Poor Standing balance comment:  (Poor using RW)                           ADL either performed or assessed with clinical judgement   ADL Overall ADL's : Needs assistance/impaired                             Toileting- Clothing Manipulation and Hygiene: Total assistance;Bed level Toileting - Clothing Manipulation Details (indicate cue type and reason): Total assist to clean bowel movement while supine in bed.                               Cognition Arousal/Alertness: Awake/alert Behavior During Therapy: WFL for tasks assessed/performed Overall Cognitive Status: Within Functional Limits for tasks assessed                                 General Comments: Non-verbal but able to follow 1 step commands with mild inconsistency.                          Pertinent Vitals/ Pain       Pain Assessment: Faces Faces Pain Scale: Hurts little more Pain Location: During bed mobility Pain Descriptors / Indicators: Grimacing;Discomfort;Sore Pain Intervention(s): Monitored during session  Frequency  Min 2X/week        Progress Toward Goals  OT Goals(current goals can now be found in the care plan section)  Progress towards OT goals: Progressing toward goals  Acute Rehab OT Goals Patient Stated Goal: not stated (non verbal) OT Goal Formulation: Patient unable to participate in goal setting Time For Goal Achievement: 06/30/20 Potential to Achieve Goals: Good ADL Goals Pt Will Perform Grooming: with supervision;sitting Pt Will Perform Upper Body Dressing: with supervision;sitting Pt Will Perform Lower Body Dressing: with min  assist;sitting/lateral leans;sit to/from stand Pt Will Transfer to Toilet: with min guard assist;stand pivot transfer;with min assist Pt/caregiver will Perform Home Exercise Program: Increased strength;Both right and left upper extremity;With Supervision  Plan Discharge plan remains appropriate                                    End of Session Equipment Utilized During Treatment: Gait belt;Rolling walker  OT Visit Diagnosis: Unsteadiness on feet (R26.81);Muscle weakness (generalized) (M62.81)   Activity Tolerance Patient tolerated treatment well   Patient Left in bed;with call bell/phone within reach;with bed alarm set   Nurse Communication Other (comment) (Notified of bowel clean up during session.)        Time: 5009-3818 OT Time Calculation (min): 42 min  Charges: OT General Charges $OT Visit: 1 Visit OT Treatments $Self Care/Home Management : 23-37 mins $Therapeutic Exercise: 8-22 mins  Marivel Mcclarty OT, MOT   Danie Chandler 06/20/2020, 10:26 AM

## 2020-06-20 NOTE — Progress Notes (Signed)
Alert and non-verbal. No s/s of pain or discomfort. Incontinent of bowel. Indwelling urinary  catheter in place and draining amber urine. Loose stool this shift. Continues on enteric, contact and airborne isolation. Needs assistance with adls. Continue plan of care.

## 2020-06-21 DIAGNOSIS — N401 Enlarged prostate with lower urinary tract symptoms: Secondary | ICD-10-CM | POA: Diagnosis not present

## 2020-06-21 DIAGNOSIS — N390 Urinary tract infection, site not specified: Secondary | ICD-10-CM | POA: Diagnosis not present

## 2020-06-21 DIAGNOSIS — U071 COVID-19: Secondary | ICD-10-CM | POA: Diagnosis not present

## 2020-06-21 DIAGNOSIS — R140 Abdominal distension (gaseous): Secondary | ICD-10-CM | POA: Diagnosis not present

## 2020-06-21 DIAGNOSIS — A4151 Sepsis due to Escherichia coli [E. coli]: Secondary | ICD-10-CM | POA: Diagnosis not present

## 2020-06-21 LAB — GLUCOSE, CAPILLARY
Glucose-Capillary: 144 mg/dL — ABNORMAL HIGH (ref 70–99)
Glucose-Capillary: 151 mg/dL — ABNORMAL HIGH (ref 70–99)
Glucose-Capillary: 179 mg/dL — ABNORMAL HIGH (ref 70–99)
Glucose-Capillary: 121 mg/dL — ABNORMAL HIGH (ref 70–99)

## 2020-06-21 NOTE — Progress Notes (Signed)
Alert and non-verbal. No s/s of pain or discomfort. Continues to need total assistance for all adls. Incontinent of bowel. Indwelling urinary catheter in place, patent with amber urine output. Foley and pericare done. Continues to have loose stools. abdomen still distended with abdominal hernia. Continues on enteric isolation for c-diff and airborne/contact isolation for covid-19. Continue plan of care

## 2020-06-21 NOTE — Progress Notes (Signed)
Occupational Therapy Treatment Patient Details Name: [name] MRN: [id number] DOB: 09/19/1950 Today's Date: 06/21/2020    History of present illness [name] is a 70 y.o. male with medical history significant of chronic nonverbal at baseline hyperlipidemia, hypertension, chronic renal retention with periodic self catheterization, recurrent UTI, BPH, history of kidney stones, periodic constipation uses laxatives, previous AKI, electrolyte normalities....  History was provided by guardian, electronic records reviewed  Presented for abdominal pain-fever x2 days.   OT comments  Pt pleasant and demonstrating increased ability to follow one step direction. Pt able to complete rolling in supine with Min A. Total assist needed for bowel management again this date. Pt was able to complete stand pivot transfer from EOB to chair using RW with minimal assist this date. Pt able to complete static standing for ~2 minutes and 30 seconds with min guard primarily during standing. Pt will continue to benefit from OT services to address strength and endurance to improve overall ADL function.   Follow Up Recommendations  Home health OT;Supervision/Assistance - 24 hour;SNF    Equipment Recommendations  None recommended by OT          Precautions / Restrictions Precautions Precautions: Fall Restrictions Weight Bearing Restrictions: No       Mobility Bed Mobility Overal bed mobility: Needs Assistance Bed Mobility: Supine to Sit;Rolling Rolling: Min assist   Supine to sit: Min guard;Min assist     General bed mobility comments: increased time, labored movement, had to use bed rail    Transfers Overall transfer level: Needs assistance Equipment used: Rolling walker (2 wheeled) Transfers: Sit to/from [employer] Sit to Stand: Min assist Stand pivot transfers: Min assist       General transfer comment: slow labored movement with kyphotic posture; improved transfer  assist level    Balance Overall balance assessment: Needs assistance Sitting-balance support: Feet supported;No upper extremity supported Sitting balance-[name]: Good Sitting balance - Comments: EOB   Standing balance support: During functional activity;Bilateral upper extremity supported Standing balance-[name]: Poor (poor to fair) Standing balance comment: Poor to fair using RW                           ADL either performed or assessed with clinical judgement   ADL Overall ADL's : Needs assistance/impaired                 Upper Body Dressing : Set up;Sitting Upper Body Dressing Details (indicate cue type and reason): Able to therad arm holes in gown sith s/u assist. Assist needed to tie gown in the back.         Toileting- Clothing Manipulation and Hygiene: Total assistance;Bed level Toileting - Clothing Manipulation Details (indicate cue type and reason): Total assist to clean bowel movement while supine in bed.                               Cognition Arousal/Alertness: Awake/alert Behavior During Therapy: WFL for tasks assessed/performed Overall Cognitive Status: Within Functional Limits for tasks assessed                                 General Comments: Non-verbal but able to follow 1 step commands with mild inconsistency.        Exercises Exercises: Other exercises Other Exercises Other Exercises: Pt completed static  standing with RW for ~2 minutes and 30 seconds before needing to sit.   Shoulder Instructions             Pertinent Vitals/ Pain       Pain Assessment: Faces Faces Pain Scale: Hurts a little bit Pain Location: During mobility Pain Descriptors / Indicators: Grimacing;Sore Pain Intervention(s): Monitored during session                                                          Frequency  Min 2X/week          OT Goals(current goals can now be found in the care  plan section)     Acute Rehab OT Goals Patient Stated Goal: not stated (non verbal) OT Goal Formulation: Patient unable to participate in goal setting Time For Goal Achievement: 06/30/20 Potential to Achieve Goals: Good ADL Goals Pt Will Perform Grooming: with supervision;sitting Pt Will Perform Upper Body Dressing: with supervision;sitting (Met goal this date.) Pt Will Perform Lower Body Dressing: with min assist;sitting/lateral leans;sit to/from stand Pt Will Transfer to Toilet: with min guard assist;stand pivot transfer;with min assist Pt/caregiver will Perform Home Exercise Program: Increased strength;Both right and left upper extremity;With Supervision  Plan Discharge plan remains appropriate                                    End of Session Equipment Utilized During Treatment: Gait belt;Rolling walker  OT Visit Diagnosis: Unsteadiness on feet (R26.81);Muscle weakness (generalized) (M62.81)   Activity Tolerance Patient tolerated treatment well   Patient Left with call bell/phone within reach;in chair;with nursing/sitter in room   Nurse Communication Mobility status        Time: 5400-8676 OT Time Calculation (min): 27 min  Charges: OT General Charges $OT Visit: 1 Visit OT Treatments $Self Care/Home Management : 8-22 mins $Therapeutic Exercise: 8-22 mins  Kayton Ripp OT, MOT    Larey Seat 06/21/2020, 9:30 AM

## 2020-06-21 NOTE — Progress Notes (Signed)
PROGRESS NOTE    [name]  BBC:[id number] DOB: 1950-07-20 DOA: 06/15/2020 PCP: Pearson Grippe, MD   Chief Complaint  Patient presents with  . Abdominal Pain    Brief admission narrative:  As per H&P written by Dr. Flossie Dibble on 06/15/2020 [name] is a 70 y.o. male with medical history significant of chronic nonverbal at baseline hyperlipidemia, hypertension, chronic renal retention with periodic self catheterization, recurrent UTI, BPH, history of kidney stones, periodic constipation uses laxatives, previous AKI, electrolyte normalities.... History was provided by guardian, electronic records reviewed Presented for abdominal pain-fever x2 days.  Assessment & Plan: 1-sepsis secondary to ESBL E. coli, Klebsiella pneumonia UTI; E. coli bacteremia and C. difficile infection -Positive E. coli bacteremia most likely coming from urinary tract. -Patient also experiencing abdominal pain, distention and diarrhea prior to admission; positive C. difficile PCR antigen and toxin. -Continue IV fluids and supportive care -continue holding on laxatives regimen.  -Stop/avoid PPI's -repeat fosfomycin in 24-48 hours; continue oral vancomycin. -continue rocephin for E. coli bacteremia (3 more days pending).  Currently day 7 of 10. -Follow culture results and sensitivity. -Continue to follow clinical response. -Planning to complete 7 days of oral vancomycin at time of discharge; after IV antibiotics for bacteremia finalized.Marland Kitchen  2-Urine retention -Chronically requiring in and out catheterizations at home -Foley catheter has been placed at time of admission. -Continue Flomax.  3-Intellectual disability/cognitive impairment -Nonverbal at baseline -Able to follow commands -Continue supportive care -plan is for return home with family care when medically stable (1/2 more days)  4-history of BPH -Continue Flomax. -continue finasteride -foley catheter in place secondary to retention    5-hypertension: Presented with systolic blood pressure in admission -Continue IV fluid -Continue holding antihypertensive agents.  6-hyperlipidemia -Continue statins.  7-COVID-19 infection -No URI symptoms -Oxygen saturation 92% on room air -Reports patient has received COVID vaccine and booster. -Continue supportive care -No remdesivir or steroids at this time.  8-hypokalemia/hypomagnesemia -Will replete and continue maintenance supplementation -In the setting of GI losses -continue to Follow electrolytes trend.   DVT prophylaxis: Heparin Code Status: DNR/DNI Communication: No family at bedside. Disposition:   Status is: Inpatient  Dispo: The patient is from: Home              Anticipated d/c is to: Home              Patient currently not medically ready for discharge.  Found positive for sepsis secondary to ESBL UTI, E. coli bacteremia and C. Difficile.  Antibiotics for UTI transition to fosfomycin, so far well tolerated.  Continue to follow clinical response/provide further evaluation/management for  ileus.   Difficult to place patient no    Consultants:   None   Procedures:  See below for x-ray reports.   Antimicrobials:  IV Rocephin >>last dose 06/18/20 Fosfomycin anticipated 2 doses 2 hours apart. Oral vancomycin IV Flagyl last dose 06/18/20   Subjective: Tolerating diet, no nausea, no vomiting, no abdominal pain.  Patient is afebrile.  Still having some loose stools.  Objective: Vitals:   06/20/20 0610 06/20/20 2158 06/21/20 0510 06/21/20 1300  BP: (!) 106/50 (!) 104/54 108/76 110/66  Pulse: 60 73 67 70  Resp: 18 18 17 17   Temp: 98 F (36.7 C) 99.3 F (37.4 C) 98.1 F (36.7 C) 98.6 F (37 C)  TempSrc: Oral Oral Oral Oral  SpO2: 96% 96% 97% 97%  Weight:   69.5 kg   Height:        Intake/Output  Summary (Last 24 hours) at 06/21/2020 1837 Last data filed at 06/21/2020 1834 Gross per 24 hour  Intake 3639.22 ml  Output 3275 ml  Net 364.22 ml    Filed Weights   06/19/20 0500 06/20/20 0500 06/21/20 0510  Weight: 68.4 kg 71.8 kg 69.5 kg    Examination: General exam: Alert, awake, no fever, following commands appropriately.  Nonverbal at baseline.  Continue to have some loose stools. Respiratory system: Clear to auscultation. Respiratory effort normal.  No using accessory muscle.  Good saturation on room air. Cardiovascular system:RRR. No murmurs, rubs, gallops.  No JVD. Gastrointestinal system: Abdomen is mildly distended, soft and nontender. No organomegaly or masses felt. Normal bowel sounds heard. Central nervous system: Alert and oriented. No focal neurological deficits. Extremities: No cyanosis or clubbing. Skin: No petechiae. Psychiatry: Mood & affect appropriate.    Data Reviewed: I have personally reviewed following labs and imaging studies  CBC: Recent Labs  Lab 06/15/20 0858 06/16/20 0421 06/17/20 0605 06/18/20 0549 06/19/20 0555  WBC 4.3 4.9 7.2 9.2 6.5  NEUTROABS 3.7  --   --   --   --   HGB 10.9* 8.6* 8.8* 9.2* 9.5*  HCT 36.1* 27.9* 29.0* 30.6* 30.6*  MCV 89.1 89.4 89.0 89.0 88.4  PLT 156 132* 145* 130* 161    Basic Metabolic Panel: Recent Labs  Lab 06/15/20 0858 06/16/20 0421 06/17/20 0605 06/18/20 0549 06/19/20 0555  NA 134* 133* 136 139 141  K 3.9 3.6 2.6* 3.6 3.9  CL 99 101 102 108 111  CO2 21* 23  GLUCOSE 135* 139* 124* 135* 153*  BUN CREATININE 0.83 0.82 0.84 0.73 0.70  CALCIUM 8.8* 7.8* 8.1* 8.0* 8.0*  MG  --   --  1.6* 2.1 1.9    GFR: Estimated Creatinine Clearance: 80.3 mL/min (by C-G formula based on SCr of 0.7 mg/dL).  Liver Function Tests: Recent Labs  Lab 06/15/20 0858  AST 17  ALT 13  ALKPHOS 66  BILITOT 0.7  PROT 7.4  ALBUMIN 3.3*    CBG: Recent Labs  Lab 06/20/20 1634 06/20/20 2156 06/21/20 0733 06/21/20 1155 06/21/20 1645  GLUCAP 173* 194* 144* 151* 179*    Recent Results (from the past 240 hour(s))  Blood Culture (routine  x 2)     Status: Abnormal   Collection Time: 06/15/20  8:58 AM   Specimen: BLOOD  Result Value Ref Range Status   Specimen Description   Final    BLOOD BLOOD RIGHT FOREARM Performed at [facility], [address] [place], [place] [address]    Special Requests   Final    Blood Culture adequate volume BOTTLES DRAWN AEROBIC AND ANAEROBIC Performed at [facility], [address] [place], [place] [address]    Culture  Setup Time   Final    IN BOTH AEROBIC AND ANAEROBIC BOTTLES GRAM NEGATIVE RODS Gram Stain Report Called to,Read Back By and Verified With: R WAGONER,RN@0252  06/16/20 MKELLY CRITICAL VALUE NOTED.  VALUE IS CONSISTENT WITH PREVIOUSLY REPORTED AND CALLED VALUE.    Culture (A)  Final    ESCHERICHIA COLI SUSCEPTIBILITIES PERFORMED ON PREVIOUS CULTURE WITHIN THE LAST 5 DAYS. Performed at [facility], [address], [place], [place] [address]    Report Status 06/18/2020 FINAL  Final  Resp Panel by RT-PCR (Flu A&B, Covid) Nasopharyngeal Swab     Status: Abnormal   Collection Time: 06/15/20  9:08 AM   Specimen: Nasopharyngeal Swab;  Nasopharyngeal(NP) swabs in vial transport medium  Result Value Ref Range Status   SARS Coronavirus 2 by RT PCR POSITIVE (A) NEGATIVE Final    Comment: RESULT CALLED TO, READ BACK BY AND VERIFIED WITH: E. GANT 3/2 @1115  S. BEARD (NOTE) SARS-CoV-2 target nucleic acids are DETECTED.  The SARS-CoV-2 RNA is generally detectable in upper respiratory specimens during the acute phase of infection. Positive results are indicative of the presence of the identified virus, but do not rule out bacterial infection or co-infection with other pathogens not detected by the test. Clinical correlation with patient history and other diagnostic information is necessary to determine patient infection status. The expected result is Negative.  Fact Sheet for Patients: BloggerCourse.comhttps://www.fda.gov/media/152166/download  Fact Sheet for Healthcare  Providers: SeriousBroker.ithttps://www.fda.gov/media/152162/download  This test is not yet approved or cleared by the [place] FDA and  has been authorized for detection and/or diagnosis of SARS-CoV-2 by FDA under an Emergency Use Authorization (EUA).  This EUA will remain in effect (meaning this test can be used)  for the duration of  the COVID-19 declaration under Section 564(b)(1) of the Act, 21 U.S.C. section 360bbb-3(b)(1), unless the authorization is terminated or revoked sooner.     Influenza A by PCR NEGATIVE NEGATIVE Final   Influenza B by PCR NEGATIVE NEGATIVE Final    Comment: (NOTE) The Xpert Xpress SARS-CoV-2/FLU/RSV plus assay is intended as an aid in the diagnosis of influenza from Nasopharyngeal swab specimens and should not be used as a sole basis for treatment. Nasal washings and aspirates are unacceptable for Xpert Xpress SARS-CoV-2/FLU/RSV testing.  Fact Sheet for Patients: BloggerCourse.comhttps://www.fda.gov/media/152166/download  Fact Sheet for Healthcare Providers: SeriousBroker.ithttps://www.fda.gov/media/152162/download  This test is not yet approved or cleared by the [place] FDA and has been authorized for detection and/or diagnosis of SARS-CoV-2 by FDA under an Emergency Use Authorization (EUA). This EUA will remain in effect (meaning this test can be used) for the duration of the COVID-19 declaration under Section 564(b)(1) of the Act, 21 U.S.C. section 360bbb-3(b)(1), unless the authorization is terminated or revoked.  Performed at [facility], [address] [place], [place] [address]   Blood Culture (routine x 2)     Status: Abnormal   Collection Time: 06/15/20  9:11 AM   Specimen: BLOOD  Result Value Ref Range Status   Specimen Description   Final    BLOOD BLOOD LEFT WRIST Performed at [facility], [address] [place], [place] [address]    Special Requests   Final    Blood Culture adequate volume BOTTLES DRAWN AEROBIC AND ANAEROBIC Performed at [facility], [address] [place], [place] [address]    Culture  Setup Time   Final    IN BOTH AEROBIC AND ANAEROBIC BOTTLES GRAM NEGATIVE RODS Gram Stain Report Called to,Read Back By and Verified With: R WAGONER,RN @0249  06/16/20 MKELLY CRITICAL RESULT CALLED TO, READ BACK BY AND VERIFIED WITH: PHARMD rbv steven hurst [id number] FCP Performed at [facility], [address] [place], [place] [address]    Culture ESCHERICHIA COLI (A)  Final   Report Status 06/18/2020 FINAL  Final   Organism ID, Bacteria ESCHERICHIA COLI  Final      Susceptibility   Escherichia coli - MIC*    AMPICILLIN 8 SENSITIVE Sensitive     CEFAZOLIN <=4 SENSITIVE Sensitive     CEFEPIME <=0.12 SENSITIVE Sensitive     CEFTAZIDIME <=1 SENSITIVE Sensitive     CEFTRIAXONE <=0.25 SENSITIVE Sensitive     CIPROFLOXACIN >=  4 RESISTANT Resistant     GENTAMICIN <=1 SENSITIVE Sensitive     IMIPENEM 0.5 SENSITIVE Sensitive     TRIMETH/SULFA >=320 RESISTANT Resistant     AMPICILLIN/SULBACTAM 4 SENSITIVE Sensitive     PIP/TAZO <=4 SENSITIVE Sensitive     * ESCHERICHIA COLI  Blood Culture ID Panel (Reflexed)     Status: Abnormal   Collection Time: 06/15/20  9:11 AM  Result Value Ref Range Status   Enterococcus faecalis NOT DETECTED NOT DETECTED Final   Enterococcus Faecium NOT DETECTED NOT DETECTED Final   Listeria monocytogenes NOT DETECTED NOT DETECTED Final   Staphylococcus species NOT DETECTED NOT DETECTED Final   Staphylococcus aureus (BCID) NOT DETECTED NOT DETECTED Final   Staphylococcus epidermidis NOT DETECTED NOT DETECTED Final   Staphylococcus lugdunensis NOT DETECTED NOT DETECTED Final   Streptococcus species NOT DETECTED NOT DETECTED Final   Streptococcus agalactiae NOT DETECTED NOT DETECTED Final   Streptococcus pneumoniae NOT DETECTED NOT DETECTED Final   Streptococcus pyogenes NOT DETECTED NOT DETECTED Final   A.calcoaceticus-baumannii NOT DETECTED NOT DETECTED Final   Bacteroides fragilis NOT DETECTED NOT DETECTED  Final   Enterobacterales DETECTED (A) NOT DETECTED Final    Comment: Enterobacterales represent a large order of gram negative bacteria, not a single organism. CRITICAL RESULT CALLED TO, READ BACK BY AND VERIFIED WITH: PHARMD D rbv steven hurst [id number] FCP    Enterobacter cloacae complex NOT DETECTED NOT DETECTED Final   Escherichia coli DETECTED (A) NOT DETECTED Final    Comment: CRITICAL RESULT CALLED TO, READ BACK BY AND VERIFIED WITH: PHARMD D rbv steven hurst [phone] [id number] FCP    Klebsiella aerogenes NOT DETECTED NOT DETECTED Final   Klebsiella oxytoca NOT DETECTED NOT DETECTED Final   Klebsiella pneumoniae NOT DETECTED NOT DETECTED Final   Proteus species NOT DETECTED NOT DETECTED Final   Salmonella species NOT DETECTED NOT DETECTED Final   Serratia marcescens NOT DETECTED NOT DETECTED Final   Haemophilus influenzae NOT DETECTED NOT DETECTED Final   Neisseria meningitidis NOT DETECTED NOT DETECTED Final   Pseudomonas aeruginosa NOT DETECTED NOT DETECTED Final   Stenotrophomonas maltophilia NOT DETECTED NOT DETECTED Final   Candida albicans NOT DETECTED NOT DETECTED Final   Candida auris NOT DETECTED NOT DETECTED Final   Candida glabrata NOT DETECTED NOT DETECTED Final   Candida krusei NOT DETECTED NOT DETECTED Final   Candida parapsilosis NOT DETECTED NOT DETECTED Final   Candida tropicalis NOT DETECTED NOT DETECTED Final   Cryptococcus neoformans/gattii NOT DETECTED NOT DETECTED Final   CTX-M ESBL NOT DETECTED NOT DETECTED Final   Carbapenem resistance IMP NOT DETECTED NOT DETECTED Final   Carbapenem resistance KPC NOT DETECTED NOT DETECTED Final   Carbapenem resistance NDM NOT DETECTED NOT DETECTED Final   Carbapenem resist OXA 48 LIKE NOT DETECTED NOT DETECTED Final   Carbapenem resistance VIM NOT DETECTED NOT DETECTED Final    Comment: Performed at [facility], [address] [place], [place] [address]  Urine culture     Status: Abnormal   Collection Time:  06/15/20  9:12 AM   Specimen: Urine, Catheterized  Result Value Ref Range Status   Specimen Description   Final    URINE, CATHETERIZED Performed at [facility], [address], [place], [place] [address]    Special Requests   Final    NONE Performed at [facility], [address], [place], [place] [address]    Culture (A)  Final    >=100,000 COLONIES/mL KLEBSIELLA PNEUMONIAE >=  100,000 COLONIES/mL ESCHERICHIA COLI Confirmed Extended Spectrum Beta-Lactamase Producer (ESBL).  In bloodstream infections from ESBL organisms, carbapenems are preferred over piperacillin/tazobactam. They are shown to have a lower risk of mortality. FOR KLEBSIELLA PNEUMONIAE    Report Status 06/18/2020 FINAL  Final   Organism ID, Bacteria KLEBSIELLA PNEUMONIAE (A)  Final   Organism ID, Bacteria ESCHERICHIA COLI (A)  Final      Susceptibility   Escherichia coli - MIC*    AMPICILLIN 16 INTERMEDIATE Intermediate     CEFAZOLIN <=4 SENSITIVE Sensitive     CEFEPIME <=0.12 SENSITIVE Sensitive     CEFTRIAXONE <=0.25 SENSITIVE Sensitive     CIPROFLOXACIN >=4 RESISTANT Resistant     GENTAMICIN <=1 SENSITIVE Sensitive     IMIPENEM <=0.25 SENSITIVE Sensitive     NITROFURANTOIN <=16 SENSITIVE Sensitive     TRIMETH/SULFA <=20 SENSITIVE Sensitive     AMPICILLIN/SULBACTAM 4 SENSITIVE Sensitive     PIP/TAZO 64 INTERMEDIATE Intermediate     * >=100,000 COLONIES/mL ESCHERICHIA COLI   Klebsiella pneumoniae - MIC*    AMPICILLIN >=32 RESISTANT Resistant     CEFAZOLIN >=64 RESISTANT Resistant     CEFEPIME >=32 RESISTANT Resistant     CEFTRIAXONE >=64 RESISTANT Resistant     CIPROFLOXACIN >=4 RESISTANT Resistant     GENTAMICIN <=1 SENSITIVE Sensitive     IMIPENEM <=0.25 SENSITIVE Sensitive     NITROFURANTOIN 256 RESISTANT Resistant     TRIMETH/SULFA 80 RESISTANT Resistant     AMPICILLIN/SULBACTAM >=32 RESISTANT Resistant     PIP/TAZO 16 SENSITIVE Sensitive     * >=100,000 COLONIES/mL KLEBSIELLA PNEUMONIAE  MRSA PCR  Screening     Status: None   Collection Time: 06/15/20  2:03 PM   Specimen: Nasopharyngeal  Result Value Ref Range Status   MRSA by PCR NEGATIVE NEGATIVE Final    Comment:        The GeneXpert MRSA Assay (FDA approved for NASAL specimens only), is one component of a comprehensive MRSA colonization surveillance program. It is not intended to diagnose MRSA infection nor to guide or monitor treatment for MRSA infections. Performed at [facility], [address] [place], [place] [address]   C Difficile Quick Screen w PCR reflex     Status: Abnormal   Collection Time: 06/16/20  8:58 AM   Specimen: STOOL  Result Value Ref Range Status   C Diff antigen POSITIVE (A) NEGATIVE Final    Comment: CRITICAL RESULT CALLED TO, READ BACK BY AND VERIFIED WITH: LARIMORE A AT 1520 06/16/2020 RC    C Diff toxin POSITIVE (A) NEGATIVE Final    Comment: CRITICAL RESULT CALLED TO, READ BACK BY AND VERIFIED WITH: LARIMORE A AT 1520 06/16/2020 RC    C Diff interpretation Toxin producing C. difficile detected.  Final    Comment: Performed at [facility], [address], [place], [place] [address]  Gastrointestinal Panel by PCR , Stool     Status: Abnormal   Collection Time: 06/16/20  1:53 PM   Specimen: STOOL  Result Value Ref Range Status   Campylobacter species NOT DETECTED NOT DETECTED Final   Plesimonas shigelloides NOT DETECTED NOT DETECTED Final   Salmonella species NOT DETECTED NOT DETECTED Final   Yersinia enterocolitica NOT DETECTED NOT DETECTED Final   Vibrio species NOT DETECTED NOT DETECTED Final   Vibrio cholerae NOT DETECTED NOT DETECTED Final   Enteroaggregative E coli (EAEC) NOT DETECTED NOT DETECTED Final   Enteropathogenic E coli (EPEC) NOT DETECTED NOT DETECTED Final   Enterotoxigenic  E coli (ETEC) NOT DETECTED NOT DETECTED Final   Shiga like toxin producing E coli (STEC) NOT DETECTED NOT DETECTED Final   Shigella/Enteroinvasive E coli (EIEC) NOT DETECTED NOT DETECTED Final    Cryptosporidium NOT DETECTED NOT DETECTED Final   Cyclospora cayetanensis NOT DETECTED NOT DETECTED Final   Entamoeba histolytica NOT DETECTED NOT DETECTED Final   Giardia lamblia NOT DETECTED NOT DETECTED Final   Adenovirus F40/41 DETECTED (A) NOT DETECTED Final   Astrovirus NOT DETECTED NOT DETECTED Final   Norovirus GI/GII NOT DETECTED NOT DETECTED Final   Rotavirus A NOT DETECTED NOT DETECTED Final   Sapovirus (I, II, IV, and V) NOT DETECTED NOT DETECTED Final    Comment: Performed at [facility], [address] [place], [place] [address]     Radiology Studies: No results found.  Scheduled Meds: . acidophilus  1 capsule Oral Daily  . aspirin  81 mg Per Tube Daily  . atorvastatin  20 mg Oral QHS  . Chlorhexidine Gluconate Cloth  6 each Topical Q0600  . ferrous sulfate  325 mg Oral Daily  . finasteride  5 mg Oral Daily  . fosfomycin  3 g Oral Q72H  . heparin  5,000 Units Subcutaneous Q8H  . insulin aspart  0-9 Units Subcutaneous TID WC  . linaclotide  145 mcg Oral QAC breakfast  . magnesium oxide  400 mg Oral Daily  . simethicone  80 mg Oral TID  . sodium chloride flush  3 mL Intravenous Q12H  . sodium chloride flush  3 mL Intravenous Q12H  . tamsulosin  0.4 mg Oral Daily  . vancomycin  125 mg Oral Q6H   Continuous Infusions: . sodium chloride    . 0.9 % NaCl with KCl 40 mEq / L 75 mL/hr at 06/21/20 0948  . cefTRIAXone (ROCEPHIN)  IV 2 g (06/21/20 1211)     LOS: 6 days    Time spent: 30 minutes   Vassie Loll, MD [facility]   To contact the attending provider between 7A-7P or the covering provider during after hours 7P-7A, please log into the web site [email or web address] and access using universal [place] password for that web site. If you do not have the password, please call the hospital operator.  06/21/2020, 6:37 PM

## 2020-06-21 NOTE — Plan of Care (Signed)

## 2020-06-22 DIAGNOSIS — R6510 Systemic inflammatory response syndrome (SIRS) of non-infectious origin without acute organ dysfunction: Secondary | ICD-10-CM | POA: Diagnosis not present

## 2020-06-22 DIAGNOSIS — A4151 Sepsis due to Escherichia coli [E. coli]: Secondary | ICD-10-CM | POA: Diagnosis not present

## 2020-06-22 DIAGNOSIS — N390 Urinary tract infection, site not specified: Secondary | ICD-10-CM | POA: Diagnosis not present

## 2020-06-22 LAB — MAGNESIUM: Magnesium: 1.8 mg/dL (ref 1.7–2.4)

## 2020-06-22 LAB — BASIC METABOLIC PANEL
Anion gap: 7 (ref 5–15)
BUN: 5 mg/dL — ABNORMAL LOW (ref 8–23)
CO2: 23 mmol/L (ref 22–32)
Calcium: 8.3 mg/dL — ABNORMAL LOW (ref 8.9–10.3)
Chloride: 107 mmol/L (ref 98–111)
Creatinine, Ser: 0.5 mg/dL — ABNORMAL LOW (ref 0.61–1.24)
GFR, Estimated: >60 mL/min (ref >60)
Glucose, Bld: 164 mg/dL — ABNORMAL HIGH (ref 70–99)
Potassium: 3.8 mmol/L (ref 3.5–5.1)
Sodium: 137 mmol/L (ref 135–145)

## 2020-06-22 LAB — GLUCOSE, CAPILLARY
Glucose-Capillary: 125 mg/dL — ABNORMAL HIGH (ref 70–99)
Glucose-Capillary: 170 mg/dL — ABNORMAL HIGH (ref 70–99)
Glucose-Capillary: 147 mg/dL — ABNORMAL HIGH (ref 70–99)
Glucose-Capillary: 166 mg/dL — ABNORMAL HIGH (ref 70–99)
Glucose-Capillary: 171 mg/dL — ABNORMAL HIGH (ref 70–99)

## 2020-06-22 NOTE — Plan of Care (Signed)
Alert and non-verbal. No s/s of pain or discomfort. Continues on airborne and contact isolation for covid-19. On room air and no s/s of respiratory distress. Continues on enteric isolation. Continues to have watery stools and abdomen is still distended. Total assist with adls. Continue plan of care.  Problem: Education: Goal: Knowledge of General Education information will improve Description: Including pain rating scale, medication(s)/side effects and non-pharmacologic comfort measures Outcome: Progressing   Problem: Health Behavior/Discharge Planning: Goal: Ability to manage health-related needs will improve Outcome: Progressing   Problem: Clinical Measurements: Goal: Ability to maintain clinical measurements within normal limits will improve Outcome: Progressing Goal: Will remain free from infection Outcome: Progressing Goal: Diagnostic test results will improve Outcome: Progressing Goal: Respiratory complications will improve Outcome: Progressing Goal: Cardiovascular complication will be avoided Outcome: Progressing   Problem: Activity: Goal: Risk for activity intolerance will decrease Outcome: Progressing   Problem: Nutrition: Goal: Adequate nutrition will be maintained Outcome: Progressing   Problem: Coping: Goal: Level of anxiety will decrease Outcome: Progressing   Problem: Elimination: Goal: Will not experience complications related to bowel motility Outcome: Progressing Goal: Will not experience complications related to urinary retention Outcome: Progressing   Problem: Pain Managment: Goal: General experience of comfort will improve Outcome: Progressing   Problem: Safety: Goal: Ability to remain free from injury will improve Outcome: Progressing   Problem: Skin Integrity: Goal: Risk for impaired skin integrity will decrease Outcome: Progressing   Problem: Education: Goal: Knowledge of General Education information will improve Description: Including  pain rating scale, medication(s)/side effects and non-pharmacologic comfort measures Outcome: Progressing   Problem: Health Behavior/Discharge Planning: Goal: Ability to manage health-related needs will improve Outcome: Progressing   Problem: Clinical Measurements: Goal: Ability to maintain clinical measurements within normal limits will improve Outcome: Progressing Goal: Will remain free from infection Outcome: Progressing Goal: Diagnostic test results will improve Outcome: Progressing Goal: Respiratory complications will improve Outcome: Progressing Goal: Cardiovascular complication will be avoided Outcome: Progressing   Problem: Activity: Goal: Risk for activity intolerance will decrease Outcome: Progressing   Problem: Nutrition: Goal: Adequate nutrition will be maintained Outcome: Progressing   Problem: Coping: Goal: Level of anxiety will decrease Outcome: Progressing   Problem: Elimination: Goal: Will not experience complications related to bowel motility Outcome: Progressing Goal: Will not experience complications related to urinary retention Outcome: Progressing   Problem: Pain Managment: Goal: General experience of comfort will improve Outcome: Progressing   Problem: Safety: Goal: Ability to remain free from injury will improve Outcome: Progressing   Problem: Skin Integrity: Goal: Risk for impaired skin integrity will decrease Outcome: Progressing

## 2020-06-22 NOTE — Progress Notes (Signed)
Occupational Therapy Treatment Patient Details Name: [name] MRN: [id number] DOB: 1950/09/17 Today's Date: 06/22/2020    History of present illness [name] is a 70 y.o. male with medical history significant of chronic nonverbal at baseline hyperlipidemia, hypertension, chronic renal retention with periodic self catheterization, recurrent UTI, BPH, history of kidney stones, periodic constipation uses laxatives, previous AKI, electrolyte normalities....  History was provided by guardian, electronic records reviewed  Presented for abdominal pain-fever x2 days.   OT comments  Pt agreeable to OT treatment this date. Pt continues to demonstrate min A for bed mobility to fully lean on side far enough for cleaning buttocks for bowel hygiene. Pt demonstrated supine to sit with min guard and extended time with use of railing. Stand pivot transfer from EOB to chair improved to mostly min guard assist using gait belt and RW. Pt was able to static stand from chair for 2 trials of 1:10 and 1:30 minutes. Pt will benefit from continued OT services to improve strength and endurance increasing functional ADL independence levels.  Follow Up Recommendations  Home health OT;Supervision/Assistance - 24 hour;SNF    Equipment Recommendations  None recommended by OT          Precautions / Restrictions Precautions Precautions: Fall Restrictions Weight Bearing Restrictions: No       Mobility Bed Mobility Overal bed mobility: Needs Assistance Bed Mobility: Supine to Sit;Rolling Rolling: Min assist   Supine to sit: Min guard     General bed mobility comments: increased time and labored movement for supint to sit with use of rail.    Transfers Overall transfer level: Needs assistance Equipment used: Rolling walker (2 wheeled) Transfers: Sit to/from [employer] Sit to Stand: Min guard;Min assist Stand pivot transfers: Min guard;Min assist       General transfer comment:  Mostly min guard assist this date. Slow labored movement but improved from last session.    Balance Overall balance assessment: Needs assistance Sitting-balance support: No upper extremity supported;Feet supported Sitting balance-[name]: Good Sitting balance - Comments: EOB   Standing balance support: During functional activity;Bilateral upper extremity supported Standing balance-[name]: Fair (poor to fair) Standing balance comment: Poor to fair using RW                           ADL either performed or assessed with clinical judgement   ADL Overall ADL's : Needs assistance/impaired                         Toilet Transfer: Min guard;Minimal assistance;RW;Stand-pivot Toilet Transfer Details (indicate cue type and reason): Simulated via EOB to chair transfer using RW. Toileting- Clothing Manipulation and Hygiene: Total assistance;Bed level Toileting - Clothing Manipulation Details (indicate cue type and reason): Total assist to clean bowel movement while supine in bed.                               Cognition Arousal/Alertness: Awake/alert Behavior During Therapy: WFL for tasks assessed/performed Overall Cognitive Status: Within Functional Limits for tasks assessed                                 General Comments: Good following of 1 step commands.        Exercises Exercises: Other exercises Other Exercises Other Exercises: Pt completed  2 reps of static standing with RW for 1 minute and 30 seconds and 1 minute and 10 seconds. Pt cued to push up on walker to lessen kyphotic posture.                Pertinent Vitals/ Pain       Pain Assessment: Faces Faces Pain Scale: Hurts a little bit Pain Location: During mobility Pain Descriptors / Indicators: Grimacing;Sore Pain Intervention(s): Monitored during session                                                          Frequency  Min 2X/week         Progress Toward Goals  OT Goals(current goals can now be found in the care plan section)  Progress towards OT goals: Progressing toward goals  Acute Rehab OT Goals Patient Stated Goal: not stated (non verbal) OT Goal Formulation: Patient unable to participate in goal setting Time For Goal Achievement: 06/30/20 Potential to Achieve Goals: Good ADL Goals Pt Will Perform Grooming: with supervision;sitting Pt Will Perform Upper Body Dressing: with supervision;sitting Pt Will Perform Lower Body Dressing: with min assist;sitting/lateral leans;sit to/from stand Pt Will Transfer to Toilet: with min guard assist;stand pivot transfer;with min assist (Meeting this goal for min guard and min assist levels. Mostly min guard this date.) Pt/caregiver will Perform Home Exercise Program: Increased strength;Both right and left upper extremity;With Supervision  Plan Discharge plan remains appropriate                                    End of Session Equipment Utilized During Treatment: Gait belt;Rolling walker  OT Visit Diagnosis: Unsteadiness on feet (R26.81);Muscle weakness (generalized) (M62.81)   Activity Tolerance Patient tolerated treatment well   Patient Left with call bell/phone within reach;in chair;with chair alarm set   Nurse Communication Mobility status (Discussed mobility status and use of RW and gait belt with NT)        Time: 4196-2229 OT Time Calculation (min): 30 min  Charges: OT Treatments $Self Care/Home Management : 8-22 mins $Therapeutic Exercise: 8-22 mins  Keyauna Graefe OT, MOT    Danie Chandler 06/22/2020, 9:13 AM

## 2020-06-22 NOTE — Progress Notes (Signed)
PROGRESS NOTE    [name]  SNK:[id number] DOB: May 30, 1950 DOA: 06/15/2020 PCP: Pearson Grippe, MD   Brief Narrative:  As per H&P written by Dr. Flossie Dibble on 06/15/2020 [name] a 70 y.o.malewith medical history significant ofchronic nonverbal at baseline hyperlipidemia, hypertension, chronic renal retention with periodic self catheterization, recurrent UTI, BPH, history of kidney stones, periodic constipation uses laxatives,previous AKI, electrolyte normalities.... History was provided by guardian, electronic records reviewed Presented for abdominal pain-fever x2 days.   Assessment & Plan:   Principal Problem:   SIRS (systemic inflammatory response syndrome) (HCC) Active Problems:   Urine retention   Intellectual disability   Hyperlipidemia   HTN (hypertension)   Diabetes mellitus type 2, noninsulin dependent (HCC)   UTI (urinary tract infection)   BPH (benign prostatic hyperplasia)   Nonverbal   COVID-19 virus infection   1-sepsis secondary to ESBL E. coli, Klebsiella pneumonia UTI; E. coli bacteremia and C. difficile infection -Positive E. coli bacteremia most likely coming from urinary tract. -Patient also experiencing abdominal pain, distention and diarrhea prior to admission; positive C. difficile PCR antigen and toxin. -Continue IV fluids and supportive care -continue holding on laxatives regimen.  -Stop/avoid PPI's -repeat fosfomycin 3/9 then end -continue rocephin for E. coli bacteremia (2 more days pending).  Currently day 8 of 10. -Follow culture results and sensitivity. -Continue to follow clinical response. -Planning to complete 7 days of oral vancomycin at time of discharge; after IV antibiotics for bacteremia finalized. Currently day 3/7.  2-Urine retention -Chronically requiring in and out catheterizations at home -Foley catheter has been placed at time of admission. -Continue Flomax.  3-Intellectual disability/cognitive  impairment -Nonverbal at baseline -Able to follow commands -Continue supportive care -plan is for return home with family care when medically stable (2-3 more days)  4-history of BPH -Continue Flomax. -continue finasteride -foley catheter in place secondary to retention   5-hypertension: Presented with systolic blood pressure in admission -Continue IV fluid -Continue holding antihypertensive agents.  6-hyperlipidemia -Continue statins.  7-COVID-19 infection -No URI symptoms -Oxygen saturation 92% on room air -Reports patient has received COVID vaccine and booster. -Continue supportive care -No remdesivir or steroids at this time.   DVT prophylaxis:Heparin Code Status: DNR Family Communication: Discussed with aunt on phone 3/9 Disposition Plan:  Status is: Inpatient  Remains inpatient appropriate because:IV treatments appropriate due to intensity of illness or inability to take PO and Inpatient level of care appropriate due to severity of illness   Dispo: The patient is from: Home              Anticipated d/c is to: Home              Patient currently is not medically stable to d/c.   Difficult to place patient No   Skin Assessment:  I have examined the patient's skin and I agree with the wound assessment as performed by the wound care RN as outlined below:  Pressure Injury 06/15/20 Buttocks Right Stage 2 -  Partial thickness loss of dermis presenting as a shallow open injury with a red, pink wound bed without slough. (Active)  06/15/20 1430  Location: Buttocks  Location Orientation: Right  Staging: Stage 2 -  Partial thickness loss of dermis presenting as a shallow open injury with a red, pink wound bed without slough.  Wound Description (Comments):   Present on Admission: Yes    Consultants:   None  Procedures:   See below  Antimicrobials:  Anti-infectives (From admission, onward)  Start     Dose/Rate Route Frequency Ordered Stop   06/20/20 1230   vancomycin (VANCOCIN) 50 mg/mL oral solution 125 mg        125 mg Oral Every 6 hours 06/20/20 1141 06/30/20 1759   06/19/20 1015  fosfomycin (MONUROL) packet 3 g        3 g Oral every 72 hours 06/19/20 0927 06/22/20 1002   06/16/20 1730  vancomycin (VANCOCIN) 50 mg/mL oral solution 500 mg  Status:  Discontinued        500 mg Oral Every 6 hours 06/16/20 1533 06/20/20 1141   06/16/20 1630  metroNIDAZOLE (FLAGYL) IVPB 500 mg  Status:  Discontinued        500 mg 100 mL/hr over 60 Minutes Intravenous Every 8 hours 06/16/20 1533 06/20/20 0952   06/16/20 1100  cefTRIAXone (ROCEPHIN) 2 g in sodium chloride 0.9 % 100 mL IVPB        2 g 200 mL/hr over 30 Minutes Intravenous Every 24 hours 06/16/20 1003     06/16/20 1000  cefTRIAXone (ROCEPHIN) 1 g in sodium chloride 0.9 % 100 mL IVPB  Status:  Discontinued        1 g 200 mL/hr over 30 Minutes Intravenous Every 24 hours 06/15/20 1238 06/16/20 0310   06/16/20 0400  Ampicillin-Sulbactam (UNASYN) 3 g in sodium chloride 0.9 % 100 mL IVPB  Status:  Discontinued        3 g 200 mL/hr over 30 Minutes Intravenous Every 6 hours 06/16/20 0310 06/16/20 1003   06/15/20 2200  nitrofurantoin (MACRODANTIN) capsule 50 mg  Status:  Discontinued        50 mg Oral Daily at bedtime 06/15/20 1241 06/16/20 1714   06/15/20 0945  cefTRIAXone (ROCEPHIN) 1 g in sodium chloride 0.9 % 100 mL IVPB        1 g 200 mL/hr over 30 Minutes Intravenous  Once 06/15/20 0940 06/15/20 1120       Subjective: Patient seen and evaluated today with no acute concerns overnight.  Patient continues have some loose stools.  Objective: Vitals:   06/21/20 2110 06/22/20 0554 06/22/20 0600 06/22/20 1324  BP: 123/67 114/64  110/66  Pulse: 75 79  83  Resp: 17   16  Temp: 97.9 F (36.6 C) 97.7 F (36.5 C)  97.9 F (36.6 C)  TempSrc: Oral Oral  Oral  SpO2: 98% 95%  99%  Weight:   71.4 kg   Height:        Intake/Output Summary (Last 24 hours) at 06/22/2020 1447 Last data filed at 06/22/2020  1400 Gross per 24 hour  Intake 2109.62 ml  Output 2503 ml  Net -393.38 ml   Filed Weights   06/20/20 0500 06/21/20 0510 06/22/20 0600  Weight: 71.8 kg 69.5 kg 71.4 kg    Examination:  General exam: Appears calm and comfortable, nonverbal Respiratory system: Clear to auscultation. Respiratory effort normal. Cardiovascular system: S1 & S2 heard, RRR.  Gastrointestinal system: Abdomen is mildly tense and distended Central nervous system: Alert and awake Extremities: No edema Skin: No significant lesions noted Psychiatry: Flat affect.    Data Reviewed: I have personally reviewed following labs and imaging studies  CBC: Recent Labs  Lab 06/16/20 0421 06/17/20 0605 06/18/20 0549 06/19/20 0555  WBC 4.9 7.2 9.2 6.5  HGB 8.6* 8.8* 9.2* 9.5*  HCT 27.9* 29.0* 30.6* 30.6*  MCV 89.4 89.0 89.0 88.4  PLT 132* 145* 130* 161   Basic Metabolic Panel: Recent Labs  Lab 06/16/20 0421 06/17/20 0605 06/18/20 0549 06/19/20 0555 06/22/20 0837  NA 133* 136 139 141 137  K 3.6 2.6* 3.6 3.9 3.8  CL 101 102 108 111 107  CO2 26 25 21* 23 23  GLUCOSE 139* 124* 135* 153* 164*  BUN 16 14 17 17  5*  CREATININE 0.82 0.84 0.73 0.70 0.50*  CALCIUM 7.8* 8.1* 8.0* 8.0* 8.3*  MG  --  1.6* 2.1 1.9 1.8   GFR: Estimated Creatinine Clearance: 80.3 mL/min (A) (by C-G formula based on SCr of 0.5 mg/dL (L)). Liver Function Tests: No results for input(s): AST, ALT, ALKPHOS, BILITOT, PROT, ALBUMIN in the last 168 hours. No results for input(s): LIPASE, AMYLASE in the last 168 hours. No results for input(s): AMMONIA in the last 168 hours. Coagulation Profile: Recent Labs  Lab 06/16/20 0421  INR 1.2   Cardiac Enzymes: No results for input(s): CKTOTAL, CKMB, CKMBINDEX, TROPONINI in the last 168 hours. BNP (last 3 results) No results for input(s): PROBNP in the last 8760 hours. HbA1C: No results for input(s): HGBA1C in the last 72 hours. CBG: Recent Labs  Lab 06/21/20 1155 06/21/20 1645  06/21/20 2111 06/22/20 0759 06/22/20 1206  GLUCAP 151* 179* 121* 125* 170*   Lipid Profile: No results for input(s): CHOL, HDL, LDLCALC, TRIG, CHOLHDL, LDLDIRECT in the last 72 hours. Thyroid Function Tests: No results for input(s): TSH, T4TOTAL, FREET4, T3FREE, THYROIDAB in the last 72 hours. Anemia Panel: No results for input(s): VITAMINB12, FOLATE, FERRITIN, TIBC, IRON, RETICCTPCT in the last 72 hours. Sepsis Labs: No results for input(s): PROCALCITON, LATICACIDVEN in the last 168 hours.  Recent Results (from the past 240 hour(s))  Blood Culture (routine x 2)     Status: Abnormal   Collection Time: 06/15/20  8:58 AM   Specimen: BLOOD  Result Value Ref Range Status   Specimen Description   Final    BLOOD BLOOD RIGHT FOREARM Performed at [facility], [address] [place], [place] [address]    Special Requests   Final    Blood Culture adequate volume BOTTLES DRAWN AEROBIC AND ANAEROBIC Performed at [facility], [address] [place], [place] [address]    Culture  Setup Time   Final    IN BOTH AEROBIC AND ANAEROBIC BOTTLES GRAM NEGATIVE RODS Gram Stain Report Called to,Read Back By and Verified With: R WAGONER,RN@0252  06/16/20 MKELLY CRITICAL VALUE NOTED.  VALUE IS CONSISTENT WITH PREVIOUSLY REPORTED AND CALLED VALUE.    Culture (A)  Final    ESCHERICHIA COLI SUSCEPTIBILITIES PERFORMED ON PREVIOUS CULTURE WITHIN THE LAST 5 DAYS. Performed at [facility], [address] [place], [place] [address]    Report Status 06/18/2020 FINAL  Final  Resp Panel by RT-PCR (Flu A&B, Covid) Nasopharyngeal Swab     Status: Abnormal   Collection Time: 06/15/20  9:08 AM   Specimen: Nasopharyngeal Swab; Nasopharyngeal(NP) swabs in vial transport medium  Result Value Ref Range Status   SARS Coronavirus 2 by RT PCR POSITIVE (A) NEGATIVE Final    Comment: RESULT CALLED TO, READ BACK BY AND VERIFIED WITH: E. GANT 3/2 @1115  S. BEARD (NOTE) SARS-CoV-2 target nucleic acids are  DETECTED.  The SARS-CoV-2 RNA is generally detectable in upper respiratory specimens during the acute phase of infection. Positive results are indicative of the presence of the identified virus, but do not rule out bacterial infection or co-infection with other pathogens not detected by the test. Clinical correlation with patient history and other diagnostic information is necessary to  determine patient infection status. The expected result is Negative.  Fact Sheet for Patients: BloggerCourse.com  Fact Sheet for Healthcare Providers: SeriousBroker.it  This test is not yet approved or cleared by the [place] FDA and  has been authorized for detection and/or diagnosis of SARS-CoV-2 by FDA under an Emergency Use Authorization (EUA).  This EUA will remain in effect (meaning this test can be used)  for the duration of  the COVID-19 declaration under Section 564(b)(1) of the Act, 21 U.S.C. section 360bbb-3(b)(1), unless the authorization is terminated or revoked sooner.     Influenza A by PCR NEGATIVE NEGATIVE Final   Influenza B by PCR NEGATIVE NEGATIVE Final    Comment: (NOTE) The Xpert Xpress SARS-CoV-2/FLU/RSV plus assay is intended as an aid in the diagnosis of influenza from Nasopharyngeal swab specimens and should not be used as a sole basis for treatment. Nasal washings and aspirates are unacceptable for Xpert Xpress SARS-CoV-2/FLU/RSV testing.  Fact Sheet for Patients: BloggerCourse.com  Fact Sheet for Healthcare Providers: SeriousBroker.it  This test is not yet approved or cleared by the [place] FDA and has been authorized for detection and/or diagnosis of SARS-CoV-2 by FDA under an Emergency Use Authorization (EUA). This EUA will remain in effect (meaning this test can be used) for the duration of the COVID-19 declaration under Section 564(b)(1) of the Act, 21  U.S.C. section 360bbb-3(b)(1), unless the authorization is terminated or revoked.  Performed at [facility], [address] [place], [place] [address]   Blood Culture (routine x 2)     Status: Abnormal   Collection Time: 06/15/20  9:11 AM   Specimen: BLOOD  Result Value Ref Range Status   Specimen Description   Final    BLOOD BLOOD LEFT WRIST Performed at [facility], [address] [place], [place] [address]    Special Requests   Final    Blood Culture adequate volume BOTTLES DRAWN AEROBIC AND ANAEROBIC Performed at [facility], [address] [place], [place] [address]    Culture  Setup Time   Final    IN BOTH AEROBIC AND ANAEROBIC BOTTLES GRAM NEGATIVE RODS Gram Stain Report Called to,Read Back By and Verified With: R WAGONER,RN  06/16/20 MKELLY CRITICAL RESULT CALLED TO, READ BACK BY AND VERIFIED WITH: PHARMD rbv steven hurst [phone] FCP Performed at [facility], [address], [place], [place] [address]    Culture ESCHERICHIA COLI (A)  Final   Report Status 06/18/2020 FINAL  Final   Organism ID, Bacteria ESCHERICHIA COLI  Final      Susceptibility   Escherichia coli - MIC*    AMPICILLIN 8 SENSITIVE Sensitive     CEFAZOLIN <=4 SENSITIVE Sensitive     CEFEPIME <=0.12 SENSITIVE Sensitive     CEFTAZIDIME <=1 SENSITIVE Sensitive     CEFTRIAXONE <=0.25 SENSITIVE Sensitive     CIPROFLOXACIN >=4 RESISTANT Resistant     GENTAMICIN <=1 SENSITIVE Sensitive     IMIPENEM 0.5 SENSITIVE Sensitive     TRIMETH/SULFA >=320 RESISTANT Resistant     AMPICILLIN/SULBACTAM 4 SENSITIVE Sensitive     PIP/TAZO <=4 SENSITIVE Sensitive     * ESCHERICHIA COLI  Blood Culture ID Panel (Reflexed)     Status: Abnormal   Collection Time: 06/15/20  9:11 AM  Result Value Ref Range Status   Enterococcus faecalis NOT DETECTED NOT DETECTED Final   Enterococcus Faecium NOT DETECTED NOT DETECTED Final   Listeria monocytogenes NOT DETECTED NOT DETECTED Final   Staphylococcus species NOT  DETECTED NOT DETECTED Final   Staphylococcus aureus (BCID) NOT DETECTED NOT DETECTED Final   Staphylococcus epidermidis NOT DETECTED NOT DETECTED Final   Staphylococcus lugdunensis NOT DETECTED NOT DETECTED Final   Streptococcus species NOT DETECTED NOT DETECTED Final   Streptococcus agalactiae NOT DETECTED NOT DETECTED Final   Streptococcus pneumoniae NOT DETECTED NOT DETECTED Final   Streptococcus pyogenes NOT DETECTED NOT DETECTED Final   A.calcoaceticus-baumannii NOT DETECTED NOT DETECTED Final   Bacteroides fragilis NOT DETECTED NOT DETECTED Final   Enterobacterales DETECTED (A) NOT DETECTED Final    Comment: Enterobacterales represent a large order of gram negative bacteria, not a single organism. CRITICAL RESULT CALLED TO, READ BACK BY AND VERIFIED WITH: PHARMD D rbv steven hurst [id number] [id number] FCP    Enterobacter cloacae complex NOT DETECTED NOT DETECTED Final   Escherichia coli DETECTED (A) NOT DETECTED Final    Comment: CRITICAL RESULT CALLED TO, READ BACK BY AND VERIFIED WITH: PHARMD D rbv steven hurst [phone] [id number] FCP    Klebsiella aerogenes NOT DETECTED NOT DETECTED Final   Klebsiella oxytoca NOT DETECTED NOT DETECTED Final   Klebsiella pneumoniae NOT DETECTED NOT DETECTED Final   Proteus species NOT DETECTED NOT DETECTED Final   Salmonella species NOT DETECTED NOT DETECTED Final   Serratia marcescens NOT DETECTED NOT DETECTED Final   Haemophilus influenzae NOT DETECTED NOT DETECTED Final   Neisseria meningitidis NOT DETECTED NOT DETECTED Final   Pseudomonas aeruginosa NOT DETECTED NOT DETECTED Final   Stenotrophomonas maltophilia NOT DETECTED NOT DETECTED Final   Candida albicans NOT DETECTED NOT DETECTED Final   Candida auris NOT DETECTED NOT DETECTED Final   Candida glabrata NOT DETECTED NOT DETECTED Final   Candida krusei NOT DETECTED NOT DETECTED Final   Candida parapsilosis NOT DETECTED NOT DETECTED Final   Candida tropicalis NOT DETECTED NOT DETECTED Final    Cryptococcus neoformans/gattii NOT DETECTED NOT DETECTED Final   CTX-M ESBL NOT DETECTED NOT DETECTED Final   Carbapenem resistance IMP NOT DETECTED NOT DETECTED Final   Carbapenem resistance KPC NOT DETECTED NOT DETECTED Final   Carbapenem resistance NDM NOT DETECTED NOT DETECTED Final   Carbapenem resist OXA 48 LIKE NOT DETECTED NOT DETECTED Final   Carbapenem resistance VIM NOT DETECTED NOT DETECTED Final    Comment: Performed at [facility], [address] [place], [place] [address]  Urine culture     Status: Abnormal   Collection Time: 06/15/20  9:12 AM   Specimen: Urine, Catheterized  Result Value Ref Range Status   Specimen Description   Final    URINE, CATHETERIZED Performed at [facility], [address], [place], [place] [address]    Special Requests   Final    NONE Performed at [facility], [address] [place], [place] [address]    Culture (A)  Final    >=100,000 COLONIES/mL KLEBSIELLA PNEUMONIAE >=100,000 COLONIES/mL ESCHERICHIA COLI Confirmed Extended Spectrum Beta-Lactamase Producer (ESBL).  In bloodstream infections from ESBL organisms, carbapenems are preferred over piperacillin/tazobactam. They are shown to have a lower risk of mortality. FOR KLEBSIELLA PNEUMONIAE    Report Status 06/18/2020 FINAL  Final   Organism ID, Bacteria KLEBSIELLA PNEUMONIAE (A)  Final   Organism ID, Bacteria ESCHERICHIA COLI (A)  Final      Susceptibility   Escherichia coli - MIC*    AMPICILLIN 16 INTERMEDIATE Intermediate     CEFAZOLIN <=4 SENSITIVE Sensitive     CEFEPIME <=0.12 SENSITIVE Sensitive     CEFTRIAXONE <=0.25 SENSITIVE Sensitive  CIPROFLOXACIN >=4 RESISTANT Resistant     GENTAMICIN <=1 SENSITIVE Sensitive     IMIPENEM <=0.25 SENSITIVE Sensitive     NITROFURANTOIN <=16 SENSITIVE Sensitive     TRIMETH/SULFA <=20 SENSITIVE Sensitive     AMPICILLIN/SULBACTAM 4 SENSITIVE Sensitive     PIP/TAZO 64 INTERMEDIATE Intermediate     * >=100,000 COLONIES/mL ESCHERICHIA  COLI   Klebsiella pneumoniae - MIC*    AMPICILLIN >=32 RESISTANT Resistant     CEFAZOLIN >=64 RESISTANT Resistant     CEFEPIME >=32 RESISTANT Resistant     CEFTRIAXONE >=64 RESISTANT Resistant     CIPROFLOXACIN >=4 RESISTANT Resistant     GENTAMICIN <=1 SENSITIVE Sensitive     IMIPENEM <=0.25 SENSITIVE Sensitive     NITROFURANTOIN 256 RESISTANT Resistant     TRIMETH/SULFA 80 RESISTANT Resistant     AMPICILLIN/SULBACTAM >=32 RESISTANT Resistant     PIP/TAZO 16 SENSITIVE Sensitive     * >=100,000 COLONIES/mL KLEBSIELLA PNEUMONIAE  MRSA PCR Screening     Status: None   Collection Time: 06/15/20  2:03 PM   Specimen: Nasopharyngeal  Result Value Ref Range Status   MRSA by PCR NEGATIVE NEGATIVE Final    Comment:        The GeneXpert MRSA Assay (FDA approved for NASAL specimens only), is one component of a comprehensive MRSA colonization surveillance program. It is not intended to diagnose MRSA infection nor to guide or monitor treatment for MRSA infections. Performed at [facility], [address] [place], [place] [address]   C Difficile Quick Screen w PCR reflex     Status: Abnormal   Collection Time: 06/16/20  8:58 AM   Specimen: STOOL  Result Value Ref Range Status   C Diff antigen POSITIVE (A) NEGATIVE Final    Comment: CRITICAL RESULT CALLED TO, READ BACK BY AND VERIFIED WITH: LARIMORE A AT 1520 06/16/2020 RC    C Diff toxin POSITIVE (A) NEGATIVE Final    Comment: CRITICAL RESULT CALLED TO, READ BACK BY AND VERIFIED WITH: LARIMORE A AT 1520 06/16/2020 RC    C Diff interpretation Toxin producing C. difficile detected.  Final    Comment: Performed at [facility], [address] [place], [place] [address]  Gastrointestinal Panel by PCR , Stool     Status: Abnormal   Collection Time: 06/16/20  1:53 PM   Specimen: STOOL  Result Value Ref Range Status   Campylobacter species NOT DETECTED NOT DETECTED Final   Plesimonas shigelloides NOT DETECTED NOT DETECTED Final    Salmonella species NOT DETECTED NOT DETECTED Final   Yersinia enterocolitica NOT DETECTED NOT DETECTED Final   Vibrio species NOT DETECTED NOT DETECTED Final   Vibrio cholerae NOT DETECTED NOT DETECTED Final   Enteroaggregative E coli (EAEC) NOT DETECTED NOT DETECTED Final   Enteropathogenic E coli (EPEC) NOT DETECTED NOT DETECTED Final   Enterotoxigenic E coli (ETEC) NOT DETECTED NOT DETECTED Final   Shiga like toxin producing E coli (STEC) NOT DETECTED NOT DETECTED Final   Shigella/Enteroinvasive E coli (EIEC) NOT DETECTED NOT DETECTED Final   Cryptosporidium NOT DETECTED NOT DETECTED Final   Cyclospora cayetanensis NOT DETECTED NOT DETECTED Final   Entamoeba histolytica NOT DETECTED NOT DETECTED Final   Giardia lamblia NOT DETECTED NOT DETECTED Final   Adenovirus F40/41 DETECTED (A) NOT DETECTED Final   Astrovirus NOT DETECTED NOT DETECTED Final   Norovirus GI/GII NOT DETECTED NOT DETECTED Final   Rotavirus A NOT DETECTED NOT DETECTED Final   Sapovirus (I, II, IV, and  V) NOT DETECTED NOT DETECTED Final    Comment: Performed at [facility], [address] [place], [place] [address]         Radiology Studies: No results found.      Scheduled Meds: . acidophilus  1 capsule Oral Daily  . aspirin  81 mg Per Tube Daily  . atorvastatin  20 mg Oral QHS  . Chlorhexidine Gluconate Cloth  6 each Topical Q0600  . ferrous sulfate  325 mg Oral Daily  . finasteride  5 mg Oral Daily  . heparin  5,000 Units Subcutaneous Q8H  . insulin aspart  0-9 Units Subcutaneous TID WC  . linaclotide  145 mcg Oral QAC breakfast  . magnesium oxide  400 mg Oral Daily  . simethicone  80 mg Oral TID  . sodium chloride flush  3 mL Intravenous Q12H  . sodium chloride flush  3 mL Intravenous Q12H  . tamsulosin  0.4 mg Oral Daily  . vancomycin  125 mg Oral Q6H   Continuous Infusions: . sodium chloride    . 0.9 % NaCl with KCl 40 mEq / L 75 mL/hr at 06/22/20 1209  . cefTRIAXone (ROCEPHIN)   IV 2 g (06/22/20 1209)     LOS: 7 days    Time spent: 35 minutes    Loxley Schmale D Sherryll Burger, DO [facility]  If 7PM-7AM, please contact night-coverage [email or web address] 06/22/2020, 2:47 PM

## 2020-06-22 NOTE — Plan of Care (Signed)

## 2020-06-23 ENCOUNTER — Inpatient Hospital Stay (HOSPITAL_COMMUNITY): Payer: Medicare Other

## 2020-06-23 DIAGNOSIS — N390 Urinary tract infection, site not specified: Secondary | ICD-10-CM | POA: Diagnosis not present

## 2020-06-23 DIAGNOSIS — R6510 Systemic inflammatory response syndrome (SIRS) of non-infectious origin without acute organ dysfunction: Secondary | ICD-10-CM | POA: Diagnosis not present

## 2020-06-23 DIAGNOSIS — A4151 Sepsis due to Escherichia coli [E. coli]: Secondary | ICD-10-CM | POA: Diagnosis not present

## 2020-06-23 LAB — MAGNESIUM: Magnesium: 1.8 mg/dL (ref 1.7–2.4)

## 2020-06-23 LAB — CBC
HCT: 37 % — ABNORMAL LOW (ref 39.0–52.0)
Hemoglobin: 11.2 g/dL — ABNORMAL LOW (ref 13.0–17.0)
MCH: 26.5 pg (ref 26.0–34.0)
MCHC: 30.3 g/dL (ref 30.0–36.0)
MCV: 87.5 fL (ref 80.0–100.0)
Platelets: 378 10*3/uL (ref 150–400)
RBC: 4.23 MIL/uL (ref 4.22–5.81)
RDW: 17.6 % — ABNORMAL HIGH (ref 11.5–15.5)
WBC: 8.4 10*3/uL (ref 4.0–10.5)
nRBC: 0.2 % (ref 0.0–0.2)

## 2020-06-23 LAB — GLUCOSE, CAPILLARY
Glucose-Capillary: 154 mg/dL — ABNORMAL HIGH (ref 70–99)
Glucose-Capillary: 169 mg/dL — ABNORMAL HIGH (ref 70–99)
Glucose-Capillary: 193 mg/dL — ABNORMAL HIGH (ref 70–99)
Glucose-Capillary: 214 mg/dL — ABNORMAL HIGH (ref 70–99)

## 2020-06-23 LAB — BASIC METABOLIC PANEL
Anion gap: 9 (ref 5–15)
BUN: <5 mg/dL — ABNORMAL LOW (ref 8–23)
CO2: 23 mmol/L (ref 22–32)
Calcium: 8.7 mg/dL — ABNORMAL LOW (ref 8.9–10.3)
Chloride: 105 mmol/L (ref 98–111)
Creatinine, Ser: 0.51 mg/dL — ABNORMAL LOW (ref 0.61–1.24)
GFR, Estimated: >60 mL/min (ref >60)
Glucose, Bld: 159 mg/dL — ABNORMAL HIGH (ref 70–99)
Potassium: 3.6 mmol/L (ref 3.5–5.1)
Sodium: 137 mmol/L (ref 135–145)

## 2020-06-23 NOTE — Progress Notes (Signed)
Occupational Therapy Treatment Patient Details Name: [name] MRN: [id number] DOB: 1950/06/25 Today's Date: 06/23/2020    History of present illness [name] is a 70 y.o. male with medical history significant of chronic nonverbal at baseline hyperlipidemia, hypertension, chronic renal retention with periodic self catheterization, recurrent UTI, BPH, history of kidney stones, periodic constipation uses laxatives, previous AKI, electrolyte normalities....  History was provided by guardian, electronic records reviewed  Presented for abdominal pain-fever x2 days.   OT comments  Pt tolerated OT treatment well. Pt required maximal assist to clean large bowel movement at bed level. Minimal assist for rolling to R and L side using rail. Pt required min guard to min A to complete x2 sit to stand and one stand pivot transfer from EOB to chair using RW. Pt was able to demonstrate UB dressing with s/u assist again this date. Pt will benefit from continued acute OT services to increase strength and endurance improving overall functional ADL status prior to d/c per recommendations below.    Follow Up Recommendations  Home health OT;Supervision/Assistance - 24 hour;SNF    Equipment Recommendations  None recommended by OT          Precautions / Restrictions Precautions Precautions: Fall Restrictions Weight Bearing Restrictions: No       Mobility Bed Mobility Overal bed mobility: Needs Assistance Bed Mobility: Supine to Sit;Rolling Rolling: Min assist   Supine to sit: Min guard     General bed mobility comments: increased time and labored movement for supint to sit with use of rail.    Transfers Overall transfer level: Needs assistance Equipment used: Rolling walker (2 wheeled) Transfers: Sit to/from [facility] Sit to Stand: Min guard;Min assist (Completed 2 reps of sit to stand from EOB) Stand pivot transfers: Min guard;Min assist       General transfer  comment: Mostly min guard assist this date. Slow labored movement but improved from last session. Assist primarily to stabilize RW on sit to stand.    Balance Overall balance assessment: Needs assistance Sitting-balance support: No upper extremity supported;Feet supported Sitting balance-[name]: Good Sitting balance - Comments: EOB   Standing balance support: During functional activity;Bilateral upper extremity supported Standing balance-[name]: Fair (Poor to fair) Standing balance comment: Poor to fair using RW                           ADL either performed or assessed with clinical judgement   ADL Overall ADL's : Needs assistance/impaired Eating/Feeding: Set up;Sitting (Nurse providing s/u assist.) Eating/Feeding Details (indicate cue type and reason): Nurse providing s/u assist.             Upper Body Dressing : Set up;Sitting Upper Body Dressing Details (indicate cue type and reason): Able to therad arm holes in gown sith s/u assist. Assist needed to tie gown in the back.     Toilet Transfer: Surveyor, minerals Details (indicate cue type and reason): Simulated via EOB to chair transfer using RW. Stabilization or RW needed at times. Toileting- Clothing Manipulation and Hygiene: Total assistance;Bed level Toileting - Clothing Manipulation Details (indicate cue type and reason): Total assist to clean bowel movement while supine in bed.                       Cognition Arousal/Alertness: Awake/alert Behavior During Therapy: WFL for tasks assessed/performed Overall Cognitive Status: Within Functional Limits for tasks assessed  General Comments: Good following of 1 step commands.                          Pertinent Vitals/ Pain       Pain Assessment: Faces Faces Pain Scale: No hurt         Frequency  Min 2X/week        Progress Toward Goals  OT  Goals(current goals can now be found in the care plan section)  Progress towards OT goals: Progressing toward goals  Acute Rehab OT Goals Patient Stated Goal: not stated (non verbal) OT Goal Formulation: Patient unable to participate in goal setting Time For Goal Achievement: 06/30/20 Potential to Achieve Goals: Good ADL Goals Pt Will Perform Grooming: with supervision;sitting Pt Will Perform Upper Body Dressing: with supervision;sitting Pt Will Perform Lower Body Dressing: with min assist;sitting/lateral leans;sit to/from stand Pt Will Transfer to Toilet: with min guard assist;stand pivot transfer;with min assist (Met goal again this date.) Pt/caregiver will Perform Home Exercise Program: Increased strength;Both right and left upper extremity;With Supervision  Plan Discharge plan remains appropriate                                    End of Session Equipment Utilized During Treatment: Gait belt;Rolling walker  OT Visit Diagnosis: Unsteadiness on feet (R26.81);Muscle weakness (generalized) (M62.81)   Activity Tolerance Patient tolerated treatment well   Patient Left in chair;with call bell/phone within reach;with chair alarm set;with nursing/sitter in room   Nurse Communication Mobility status        Time: 5868-2574 OT Time Calculation (min): 33 min  Charges: OT General Charges $OT Visit: 1 Visit OT Treatments $Self Care/Home Management : 23-37 mins  Andris Brothers OT, MOT    Larey Seat 06/23/2020, 9:21 AM

## 2020-06-23 NOTE — Plan of Care (Signed)

## 2020-06-23 NOTE — Progress Notes (Signed)
PROGRESS NOTE    [name]  OZD:[id number] DOB: Apr 24, 1950 DOA: 06/15/2020 PCP: Pearson Grippe, MD   Brief Narrative:  As per H&P written by Dr. Flossie Dibble on 06/15/2020 [name] a 70 y.o.malewith medical history significant ofchronic nonverbal at baseline hyperlipidemia, hypertension, chronic renal retention with periodic self catheterization, recurrent UTI, BPH, history of kidney stones, periodic constipation uses laxatives,previous AKI, electrolyte normalities.... History was provided by guardian, electronic records reviewed Presented for abdominal pain-fever x2 days.  Assessment & Plan:   Principal Problem:   SIRS (systemic inflammatory response syndrome) (HCC) Active Problems:   Urine retention   Intellectual disability   Hyperlipidemia   HTN (hypertension)   Diabetes mellitus type 2, noninsulin dependent (HCC)   UTI (urinary tract infection)   BPH (benign prostatic hyperplasia)   Nonverbal   COVID-19 virus infection   1-sepsis secondary to ESBL E. coli, Klebsiella pneumoniaUTI; E. coli bacteremiaand C. difficile infection -Positive E. coli bacteremia most likely coming from urinary tract. -Patient also experiencing abdominal pain, distention and diarrhea prior to admission; positive C. difficile PCR antigen and toxin. -Continue IV fluids and supportive care -continue holding on laxatives regimen.  -Stop/avoid PPI's -repeat fosfomycin 3/9, now completed -continue rocephinfor E. coli bacteremia (1 more day pending). Currently day 9 of 10. -Follow culture results and sensitivity. -Continue to follow clinical response. -Planning to complete 7 days of oral vancomycin at time of discharge; after IV antibiotics for bacteremia finalized. Currently day 4/7.  2-Urine retention -Chronically requiring in and out catheterizations at home -Foley catheter has been placed at time of admission. -Continue Flomax.  3-Intellectual disability/cognitive  impairment -Nonverbal at baseline -Able to follow commands -Continue supportive care -plan is for return home with family care when medically stable (2-3 more days)  4-history of BPH -Continue Flomax. -continue finasteride -foley catheter in place secondary to retention   5-hypertension: Presented with systolic blood pressure in admission -Continue IV fluid -Continue holding antihypertensive agents.  6-hyperlipidemia -Continue statins.  7-COVID-19 infection -No URI symptoms -Oxygen saturation 92% on room air -Reports patient has received COVID vaccine and booster. -Continue supportive care -No remdesivir or steroids at this time.  8-Abdominal gaseous distention -Improving today -Encouraging ambulation -Check KUB -Tolerating diet   DVT prophylaxis:Heparin Code Status: DNR Family Communication: Discussed with aunt on phone 3/10 Disposition Plan:  Status is: Inpatient  Remains inpatient appropriate because:IV treatments appropriate due to intensity of illness or inability to take PO and Inpatient level of care appropriate due to severity of illness   Dispo: The patient is from: Home  Anticipated d/c is to: Home  Patient currently is not medically stable to d/c.              Difficult to place patient No   Skin Assessment:  I have examined the patient's skin and I agree with the wound assessment as performed by the wound care RN as outlined below:  Pressure Injury 06/15/20 Buttocks Right Stage 2 -  Partial thickness loss of dermis presenting as a shallow open injury with a red, pink wound bed without slough. (Active)  06/15/20 1430  Location: Buttocks  Location Orientation: Right  Staging: Stage 2 -  Partial thickness loss of dermis presenting as a shallow open injury with a red, pink wound bed without slough.  Wound Description (Comments):   Present on Admission: Yes    Consultants:   None  Procedures:   See  below  Antimicrobials:  Anti-infectives (From admission, onward)   Start  Dose/Rate Route Frequency Ordered Stop   06/20/20 1230  vancomycin (VANCOCIN) 50 mg/mL oral solution 125 mg        125 mg Oral Every 6 hours 06/20/20 1141 06/30/20 1759   06/19/20 1015  fosfomycin (MONUROL) packet 3 g        3 g Oral every 72 hours 06/19/20 0927 06/22/20 1002   06/16/20 1730  vancomycin (VANCOCIN) 50 mg/mL oral solution 500 mg  Status:  Discontinued        500 mg Oral Every 6 hours 06/16/20 1533 06/20/20 1141   06/16/20 1630  metroNIDAZOLE (FLAGYL) IVPB 500 mg  Status:  Discontinued        500 mg 100 mL/hr over 60 Minutes Intravenous Every 8 hours 06/16/20 1533 06/20/20 0952   06/16/20 1100  cefTRIAXone (ROCEPHIN) 2 g in sodium chloride 0.9 % 100 mL IVPB        2 g 200 mL/hr over 30 Minutes Intravenous Every 24 hours 06/16/20 1003     06/16/20 1000  cefTRIAXone (ROCEPHIN) 1 g in sodium chloride 0.9 % 100 mL IVPB  Status:  Discontinued        1 g 200 mL/hr over 30 Minutes Intravenous Every 24 hours 06/15/20 1238 06/16/20 0310   06/16/20 0400  Ampicillin-Sulbactam (UNASYN) 3 g in sodium chloride 0.9 % 100 mL IVPB  Status:  Discontinued        3 g 200 mL/hr over 30 Minutes Intravenous Every 6 hours 06/16/20 0310 06/16/20 1003   06/15/20 2200  nitrofurantoin (MACRODANTIN) capsule 50 mg  Status:  Discontinued        50 mg Oral Daily at bedtime 06/15/20 1241 06/16/20 1714   06/15/20 0945  cefTRIAXone (ROCEPHIN) 1 g in sodium chloride 0.9 % 100 mL IVPB        1 g 200 mL/hr over 30 Minutes Intravenous  Once 06/15/20 0940 06/15/20 1120       Subjective: Patient seen and evaluated today with no acute concerns or events noted overnight.  Gaseous distention appears to be improving.  Objective: Vitals:   06/22/20 2058 06/22/20 2100 06/23/20 0500 06/23/20 0622  BP: 124/64   127/74  Pulse: 87   99  Resp: 19   19  Temp:    97.7 F (36.5 C)  TempSrc:    Temporal  SpO2: 96%   94%  Weight:  72 kg  72 kg   Height:        Intake/Output Summary (Last 24 hours) at 06/23/2020 1126 Last data filed at 06/23/2020 0856 Gross per 24 hour  Intake 1670 ml  Output 1502 ml  Net 168 ml   Filed Weights   06/22/20 0600 06/22/20 2100 06/23/20 0500  Weight: 71.4 kg 72 kg 72 kg    Examination:  General exam: Appears calm and comfortable, nonverbal Respiratory system: Clear to auscultation. Respiratory effort normal. Cardiovascular system: S1 & S2 heard, RRR.  Gastrointestinal system: Abdomen is distended, but softer Central nervous system: Alert and awake Extremities: No edema Skin: No significant lesions noted Psychiatry: Flat affect.    Data Reviewed: I have personally reviewed following labs and imaging studies  CBC: Recent Labs  Lab 06/17/20 0605 06/18/20 0549 06/19/20 0555 06/23/20 0545  WBC 7.2 9.2 6.5 8.4  HGB 8.8* 9.2* 9.5* 11.2*  HCT 29.0* 30.6* 30.6* 37.0*  MCV 89.0 89.0 88.4 87.5  PLT 145* 130* 161 378   Basic Metabolic Panel: Recent Labs  Lab 06/17/20 0605 06/18/20 0549 06/19/20 0555 06/22/20 8366  06/23/20 0545  NA 136 139 141 137 137  K 2.6* 3.6 3.9 3.8 3.6  CL 102 108 111 107 105  CO2 25 21* GLUCOSE 124* 135* 153* 164* 159*  BUN 5* <5*  CREATININE 0.84 0.73 0.70 0.50* 0.51*  CALCIUM 8.1* 8.0* 8.0* 8.3* 8.7*  MG 1.6* 2.1 1.9 1.8 1.8   GFR: Estimated Creatinine Clearance: 80.3 mL/min (A) (by C-G formula based on SCr of 0.51 mg/dL (L)). Liver Function Tests: No results for input(s): AST, ALT, ALKPHOS, BILITOT, PROT, ALBUMIN in the last 168 hours. No results for input(s): LIPASE, AMYLASE in the last 168 hours. No results for input(s): AMMONIA in the last 168 hours. Coagulation Profile: No results for input(s): INR, PROTIME in the last 168 hours. Cardiac Enzymes: No results for input(s): CKTOTAL, CKMB, CKMBINDEX, TROPONINI in the last 168 hours. BNP (last 3 results) No results for input(s): PROBNP in the last 8760  hours. HbA1C: No results for input(s): HGBA1C in the last 72 hours. CBG: Recent Labs  Lab 06/22/20 1206 06/22/20 1538 06/22/20 1711 06/22/20 2101 06/23/20 0817  GLUCAP 170* 147* 166* 171* 154*   Lipid Profile: No results for input(s): CHOL, HDL, LDLCALC, TRIG, CHOLHDL, LDLDIRECT in the last 72 hours. Thyroid Function Tests: No results for input(s): TSH, T4TOTAL, FREET4, T3FREE, THYROIDAB in the last 72 hours. Anemia Panel: No results for input(s): VITAMINB12, FOLATE, FERRITIN, TIBC, IRON, RETICCTPCT in the last 72 hours. Sepsis Labs: No results for input(s): PROCALCITON, LATICACIDVEN in the last 168 hours.  Recent Results (from the past 240 hour(s))  Blood Culture (routine x 2)     Status: Abnormal   Collection Time: 06/15/20  8:58 AM   Specimen: BLOOD  Result Value Ref Range Status   Specimen Description   Final    BLOOD BLOOD RIGHT FOREARM Performed at [facility], [address], [place], [place] [address]    Special Requests   Final    Blood Culture adequate volume BOTTLES DRAWN AEROBIC AND ANAEROBIC Performed at [facility], [address] [place], [place] [address]    Culture  Setup Time   Final    IN BOTH AEROBIC AND ANAEROBIC BOTTLES GRAM NEGATIVE RODS Gram Stain Report Called to,Read Back By and Verified With: R WAGONER,RN@0252  06/16/20 MKELLY CRITICAL VALUE NOTED.  VALUE IS CONSISTENT WITH PREVIOUSLY REPORTED AND CALLED VALUE.    Culture (A)  Final    ESCHERICHIA COLI SUSCEPTIBILITIES PERFORMED ON PREVIOUS CULTURE WITHIN THE LAST 5 DAYS. Performed at [facility], [address] [place], [place] [address]    Report Status 06/18/2020 FINAL  Final  Resp Panel by RT-PCR (Flu A&B, Covid) Nasopharyngeal Swab     Status: Abnormal   Collection Time: 06/15/20  9:08 AM   Specimen: Nasopharyngeal Swab; Nasopharyngeal(NP) swabs in vial transport medium  Result Value Ref Range Status   SARS Coronavirus 2 by RT PCR POSITIVE (A) NEGATIVE Final    Comment:  RESULT CALLED TO, READ BACK BY AND VERIFIED WITH: E. GANT 3/2  S. BEARD (NOTE) SARS-CoV-2 target nucleic acids are DETECTED.  The SARS-CoV-2 RNA is generally detectable in upper respiratory specimens during the acute phase of infection. Positive results are indicative of the presence of the identified virus, but do not rule out bacterial infection or co-infection with other pathogens not detected by the test. Clinical correlation with patient history and other diagnostic information is necessary to determine patient infection status. The expected result is Negative.  Fact  Sheet for Patients: BloggerCourse.com  Fact Sheet for Healthcare Providers: SeriousBroker.it  This test is not yet approved or cleared by the [place] FDA and  has been authorized for detection and/or diagnosis of SARS-CoV-2 by FDA under an Emergency Use Authorization (EUA).  This EUA will remain in effect (meaning this test can be used)  for the duration of  the COVID-19 declaration under Section 564(b)(1) of the Act, 21 U.S.C. section 360bbb-3(b)(1), unless the authorization is terminated or revoked sooner.     Influenza A by PCR NEGATIVE NEGATIVE Final   Influenza B by PCR NEGATIVE NEGATIVE Final    Comment: (NOTE) The Xpert Xpress SARS-CoV-2/FLU/RSV plus assay is intended as an aid in the diagnosis of influenza from Nasopharyngeal swab specimens and should not be used as a sole basis for treatment. Nasal washings and aspirates are unacceptable for Xpert Xpress SARS-CoV-2/FLU/RSV testing.  Fact Sheet for Patients: BloggerCourse.com  Fact Sheet for Healthcare Providers: SeriousBroker.it  This test is not yet approved or cleared by the [place] FDA and has been authorized for detection and/or diagnosis of SARS-CoV-2 by FDA under an Emergency Use Authorization (EUA). This EUA will remain in  effect (meaning this test can be used) for the duration of the COVID-19 declaration under Section 564(b)(1) of the Act, 21 U.S.C. section 360bbb-3(b)(1), unless the authorization is terminated or revoked.  Performed at [facility], [address] [place], [place] [address]   Blood Culture (routine x 2)     Status: Abnormal   Collection Time: 06/15/20  9:11 AM   Specimen: BLOOD  Result Value Ref Range Status   Specimen Description   Final    BLOOD BLOOD LEFT WRIST Performed at [facility], [address] [place], [place] [address]    Special Requests   Final    Blood Culture adequate volume BOTTLES DRAWN AEROBIC AND ANAEROBIC Performed at [facility], [address], [place], [place] [address]    Culture  Setup Time   Final    IN BOTH AEROBIC AND ANAEROBIC BOTTLES GRAM NEGATIVE RODS Gram Stain Report Called to,Read Back By and Verified With: R Joseph Art  06/16/20 MKELLY CRITICAL RESULT CALLED TO, READ BACK BY AND VERIFIED WITH: PHARMD rbv steven hurst [id number] FCP Performed at [facility], [address] [place], [place] [address]    Culture ESCHERICHIA COLI (A)  Final   Report Status 06/18/2020 FINAL  Final   Organism ID, Bacteria ESCHERICHIA COLI  Final      Susceptibility   Escherichia coli - MIC*    AMPICILLIN 8 SENSITIVE Sensitive     CEFAZOLIN <=4 SENSITIVE Sensitive     CEFEPIME <=0.12 SENSITIVE Sensitive     CEFTAZIDIME <=1 SENSITIVE Sensitive     CEFTRIAXONE <=0.25 SENSITIVE Sensitive     CIPROFLOXACIN >=4 RESISTANT Resistant     GENTAMICIN <=1 SENSITIVE Sensitive     IMIPENEM 0.5 SENSITIVE Sensitive     TRIMETH/SULFA >=320 RESISTANT Resistant     AMPICILLIN/SULBACTAM 4 SENSITIVE Sensitive     PIP/TAZO <=4 SENSITIVE Sensitive     * ESCHERICHIA COLI  Blood Culture ID Panel (Reflexed)     Status: Abnormal   Collection Time: 06/15/20  9:11 AM  Result Value Ref Range Status   Enterococcus faecalis NOT DETECTED NOT DETECTED Final   Enterococcus  Faecium NOT DETECTED NOT DETECTED Final   Listeria monocytogenes NOT DETECTED NOT DETECTED Final   Staphylococcus species NOT DETECTED NOT DETECTED Final   Staphylococcus aureus (BCID) NOT  DETECTED NOT DETECTED Final   Staphylococcus epidermidis NOT DETECTED NOT DETECTED Final   Staphylococcus lugdunensis NOT DETECTED NOT DETECTED Final   Streptococcus species NOT DETECTED NOT DETECTED Final   Streptococcus agalactiae NOT DETECTED NOT DETECTED Final   Streptococcus pneumoniae NOT DETECTED NOT DETECTED Final   Streptococcus pyogenes NOT DETECTED NOT DETECTED Final   A.calcoaceticus-baumannii NOT DETECTED NOT DETECTED Final   Bacteroides fragilis NOT DETECTED NOT DETECTED Final   Enterobacterales DETECTED (A) NOT DETECTED Final    Comment: Enterobacterales represent a large order of gram negative bacteria, not a single organism. CRITICAL RESULT CALLED TO, READ BACK BY AND VERIFIED WITH: PHARMD D rbv steven hurst [id number] FCP    Enterobacter cloacae complex NOT DETECTED NOT DETECTED Final   Escherichia coli DETECTED (A) NOT DETECTED Final    Comment: CRITICAL RESULT CALLED TO, READ BACK BY AND VERIFIED WITH: PHARMD D rbv steven hurst [id number] FCP    Klebsiella aerogenes NOT DETECTED NOT DETECTED Final   Klebsiella oxytoca NOT DETECTED NOT DETECTED Final   Klebsiella pneumoniae NOT DETECTED NOT DETECTED Final   Proteus species NOT DETECTED NOT DETECTED Final   Salmonella species NOT DETECTED NOT DETECTED Final   Serratia marcescens NOT DETECTED NOT DETECTED Final   Haemophilus influenzae NOT DETECTED NOT DETECTED Final   Neisseria meningitidis NOT DETECTED NOT DETECTED Final   Pseudomonas aeruginosa NOT DETECTED NOT DETECTED Final   Stenotrophomonas maltophilia NOT DETECTED NOT DETECTED Final   Candida albicans NOT DETECTED NOT DETECTED Final   Candida auris NOT DETECTED NOT DETECTED Final   Candida glabrata NOT DETECTED NOT DETECTED Final   Candida krusei NOT DETECTED NOT  DETECTED Final   Candida parapsilosis NOT DETECTED NOT DETECTED Final   Candida tropicalis NOT DETECTED NOT DETECTED Final   Cryptococcus neoformans/gattii NOT DETECTED NOT DETECTED Final   CTX-M ESBL NOT DETECTED NOT DETECTED Final   Carbapenem resistance IMP NOT DETECTED NOT DETECTED Final   Carbapenem resistance KPC NOT DETECTED NOT DETECTED Final   Carbapenem resistance NDM NOT DETECTED NOT DETECTED Final   Carbapenem resist OXA 48 LIKE NOT DETECTED NOT DETECTED Final   Carbapenem resistance VIM NOT DETECTED NOT DETECTED Final    Comment: Performed at [facility], [address], [place], [place] [address]  Urine culture     Status: Abnormal   Collection Time: 06/15/20  9:12 AM   Specimen: Urine, Catheterized  Result Value Ref Range Status   Specimen Description   Final    URINE, CATHETERIZED Performed at [facility], [address] [place], [place] [address]    Special Requests   Final    NONE Performed at [facility], [address], [place], [place] [address]    Culture (A)  Final    >=100,000 COLONIES/mL KLEBSIELLA PNEUMONIAE >=100,000 COLONIES/mL ESCHERICHIA COLI Confirmed Extended Spectrum Beta-Lactamase Producer (ESBL).  In bloodstream infections from ESBL organisms, carbapenems are preferred over piperacillin/tazobactam. They are shown to have a lower risk of mortality. FOR KLEBSIELLA PNEUMONIAE    Report Status 06/18/2020 FINAL  Final   Organism ID, Bacteria KLEBSIELLA PNEUMONIAE (A)  Final   Organism ID, Bacteria ESCHERICHIA COLI (A)  Final      Susceptibility   Escherichia coli - MIC*    AMPICILLIN 16 INTERMEDIATE Intermediate     CEFAZOLIN <=4 SENSITIVE Sensitive     CEFEPIME <=0.12 SENSITIVE Sensitive     CEFTRIAXONE <=0.25 SENSITIVE Sensitive     CIPROFLOXACIN >=4 RESISTANT Resistant  GENTAMICIN <=1 SENSITIVE Sensitive     IMIPENEM <=0.25 SENSITIVE Sensitive     NITROFURANTOIN <=16 SENSITIVE Sensitive     TRIMETH/SULFA <=20 SENSITIVE Sensitive      AMPICILLIN/SULBACTAM 4 SENSITIVE Sensitive     PIP/TAZO 64 INTERMEDIATE Intermediate     * >=100,000 COLONIES/mL ESCHERICHIA COLI   Klebsiella pneumoniae - MIC*    AMPICILLIN >=32 RESISTANT Resistant     CEFAZOLIN >=64 RESISTANT Resistant     CEFEPIME >=32 RESISTANT Resistant     CEFTRIAXONE >=64 RESISTANT Resistant     CIPROFLOXACIN >=4 RESISTANT Resistant     GENTAMICIN <=1 SENSITIVE Sensitive     IMIPENEM <=0.25 SENSITIVE Sensitive     NITROFURANTOIN 256 RESISTANT Resistant     TRIMETH/SULFA 80 RESISTANT Resistant     AMPICILLIN/SULBACTAM >=32 RESISTANT Resistant     PIP/TAZO 16 SENSITIVE Sensitive     * >=100,000 COLONIES/mL KLEBSIELLA PNEUMONIAE  MRSA PCR Screening     Status: None   Collection Time: 06/15/20  2:03 PM   Specimen: Nasopharyngeal  Result Value Ref Range Status   MRSA by PCR NEGATIVE NEGATIVE Final    Comment:        The GeneXpert MRSA Assay (FDA approved for NASAL specimens only), is one component of a comprehensive MRSA colonization surveillance program. It is not intended to diagnose MRSA infection nor to guide or monitor treatment for MRSA infections. Performed at [facility], [address] [place], [place] [address]   C Difficile Quick Screen w PCR reflex     Status: Abnormal   Collection Time: 06/16/20  8:58 AM   Specimen: STOOL  Result Value Ref Range Status   C Diff antigen POSITIVE (A) NEGATIVE Final    Comment: CRITICAL RESULT CALLED TO, READ BACK BY AND VERIFIED WITH: LARIMORE A AT 1520 06/16/2020 RC    C Diff toxin POSITIVE (A) NEGATIVE Final    Comment: CRITICAL RESULT CALLED TO, READ BACK BY AND VERIFIED WITH: LARIMORE A AT 1520 06/16/2020 RC    C Diff interpretation Toxin producing C. difficile detected.  Final    Comment: Performed at [facility], [address] [place], [place] [address]  Gastrointestinal Panel by PCR , Stool     Status: Abnormal   Collection Time: 06/16/20  1:53 PM   Specimen: STOOL  Result Value Ref Range  Status   Campylobacter species NOT DETECTED NOT DETECTED Final   Plesimonas shigelloides NOT DETECTED NOT DETECTED Final   Salmonella species NOT DETECTED NOT DETECTED Final   Yersinia enterocolitica NOT DETECTED NOT DETECTED Final   Vibrio species NOT DETECTED NOT DETECTED Final   Vibrio cholerae NOT DETECTED NOT DETECTED Final   Enteroaggregative E coli (EAEC) NOT DETECTED NOT DETECTED Final   Enteropathogenic E coli (EPEC) NOT DETECTED NOT DETECTED Final   Enterotoxigenic E coli (ETEC) NOT DETECTED NOT DETECTED Final   Shiga like toxin producing E coli (STEC) NOT DETECTED NOT DETECTED Final   Shigella/Enteroinvasive E coli (EIEC) NOT DETECTED NOT DETECTED Final   Cryptosporidium NOT DETECTED NOT DETECTED Final   Cyclospora cayetanensis NOT DETECTED NOT DETECTED Final   Entamoeba histolytica NOT DETECTED NOT DETECTED Final   Giardia lamblia NOT DETECTED NOT DETECTED Final   Adenovirus F40/41 DETECTED (A) NOT DETECTED Final   Astrovirus NOT DETECTED NOT DETECTED Final   Norovirus GI/GII NOT DETECTED NOT DETECTED Final   Rotavirus A NOT DETECTED NOT DETECTED Final   Sapovirus (I, II, IV, and V) NOT DETECTED NOT DETECTED Final  Comment: Performed at [facility], [address], [place], [place] [address]         Radiology Studies: No results found.      Scheduled Meds: . acidophilus  1 capsule Oral Daily  . aspirin  81 mg Per Tube Daily  . atorvastatin  20 mg Oral QHS  . Chlorhexidine Gluconate Cloth  6 each Topical Q0600  . ferrous sulfate  325 mg Oral Daily  . finasteride  5 mg Oral Daily  . heparin  5,000 Units Subcutaneous Q8H  . insulin aspart  0-9 Units Subcutaneous TID WC  . linaclotide  145 mcg Oral QAC breakfast  . magnesium oxide  400 mg Oral Daily  . simethicone  80 mg Oral TID  . sodium chloride flush  3 mL Intravenous Q12H  . sodium chloride flush  3 mL Intravenous Q12H  . tamsulosin  0.4 mg Oral Daily  . vancomycin  125 mg Oral Q6H    Continuous Infusions: . sodium chloride    . 0.9 % NaCl with KCl 40 mEq / L 75 mL/hr at 06/23/20 0150  . cefTRIAXone (ROCEPHIN)  IV 2 g (06/23/20 1020)     LOS: 8 days    Time spent: 35 minutes    Levi Hoover Brunette, DO [facility]  If 7PM-7AM, please contact night-coverage [email or web address] 06/23/2020, 11:26 AM

## 2020-06-23 NOTE — Progress Notes (Signed)
Physical Therapy Treatment Patient Details Name: [name] MRN: [id number] DOB: 02/15/1951 Today's Date: 06/23/2020    History of Present Illness [name] is a 70 y.o. male with medical history significant of chronic nonverbal at baseline hyperlipidemia, hypertension, chronic renal retention with periodic self catheterization, recurrent UTI, BPH, history of kidney stones, periodic constipation uses laxatives, previous AKI, electrolyte normalities....  History was provided by guardian, electronic records reviewed  Presented for abdominal pain-fever x2 days.    PT Comments    Patient present seated in chair (assisted by OT staff) and agreeable for therapy.  Patient demonstrates fair return for completing BLE ROM/strengthening exercises requiring verbal cues and demonstration, very unsteady on feet and limited to a few steps at bedside due to generalized weakness and incontinent of stool.  Patient put back to bed and nursing staff notified to clean patient.  Patient will benefit from continued physical therapy in hospital and recommended venue below to increase strength, balance, endurance for safe ADLs and gait.    Follow Up Recommendations  SNF;Supervision for mobility/OOB;Supervision - Intermittent     Equipment Recommendations  None recommended by PT    Recommendations for Other Services       Precautions / Restrictions Precautions Precautions: Fall Restrictions Weight Bearing Restrictions: No    Mobility  Bed Mobility Overal bed mobility: Needs Assistance Bed Mobility: Sit to Supine       Sit to supine: Min assist;Mod assist   General bed mobility comments: slow labored movement with difficulty repositioning BLE    Transfers Overall transfer level: Needs assistance Equipment used: Rolling walker (2 wheeled) Transfers: Sit to/from [employer] Sit to Stand: Min guard;Min assist Stand pivot transfers: Min assist;Mod assist       General  transfer comment: slow labored movement with poor tolerance for standing due to weakness  Ambulation/Gait Ambulation/Gait assistance: Mod assist Gait Distance (Feet): 5 Feet Assistive device: Rolling walker (2 wheeled) Gait Pattern/deviations: Decreased step length - right;Decreased step length - left;Decreased stride length;Trunk flexed Gait velocity: slow   General Gait Details: slow labored movement, fair return for extending trunk, limited mostly due to weakness/pooor standing balance   Stairs             Wheelchair Mobility    Modified Rankin (Stroke Patients Only)       Balance Overall balance assessment: Needs assistance Sitting-balance support: Feet supported;No upper extremity supported Sitting balance-[name]: Good Sitting balance - Comments: EOB   Standing balance support: During functional activity;Bilateral upper extremity supported Standing balance-[name]: Poor Standing balance comment: using RW                            Cognition Arousal/Alertness: Awake/alert Behavior During Therapy: WFL for tasks assessed/performed Overall Cognitive Status: Within Functional Limits for tasks assessed                                        Exercises General Exercises - Lower Extremity Ankle Circles/Pumps: Seated;AROM;Strengthening;Both;10 reps Long Arc Quad: Seated;AROM;Strengthening;Both;10 reps Hip Flexion/Marching: Seated;AROM;Strengthening;Both;10 reps    General Comments        Pertinent Vitals/Pain Pain Assessment: Faces Faces Pain Scale: Hurts little more Pain Location: over buttocks when scooting in chair Pain Descriptors / Indicators: Sore;Grimacing;Discomfort Pain Intervention(s): Limited activity within patient's tolerance;Monitored during session;Repositioned    Home Living  Prior Function            PT Goals (current goals can now be found in the care plan section) Acute  Rehab PT Goals Patient Stated Goal: not stated (non verbal) PT Goal Formulation: With patient Time For Goal Achievement: 06/30/20 Potential to Achieve Goals: Good Progress towards PT goals: Progressing toward goals    Frequency    Min 3X/week      PT Plan Current plan remains appropriate    Co-evaluation              AM-PAC PT "6 Clicks" Mobility   Outcome Measure  Help needed turning from your back to your side while in a flat bed without using bedrails?: A Little Help needed moving from lying on your back to sitting on the side of a flat bed without using bedrails?: A Little Help needed moving to and from a bed to a chair (including a wheelchair)?: A Lot Help needed standing up from a chair using your arms (e.g., wheelchair or bedside chair)?: A Lot Help needed to walk in hospital room?: A Lot Help needed climbing 3-5 steps with a railing? : Total 6 Click Score: 13    End of Session   Activity Tolerance: Patient tolerated treatment well;Patient limited by fatigue Patient left: in bed;with call bell/phone within reach;with bed alarm set Nurse Communication: Mobility status PT Visit Diagnosis: Unsteadiness on feet (R26.81);Other abnormalities of gait and mobility (R26.89);Muscle weakness (generalized) (M62.81)     Time: 1610-9604 PT Time Calculation (min) (ACUTE ONLY): 24 min  Charges:  $Therapeutic Exercise: 8-22 mins $Therapeutic Activity: 8-22 mins                     2:04 PM, 06/23/20 [name], MPT Physical Therapist with [facility] [phone] office [phone] mobile phone

## 2020-06-24 DIAGNOSIS — R6510 Systemic inflammatory response syndrome (SIRS) of non-infectious origin without acute organ dysfunction: Secondary | ICD-10-CM | POA: Diagnosis not present

## 2020-06-24 DIAGNOSIS — N390 Urinary tract infection, site not specified: Secondary | ICD-10-CM | POA: Diagnosis not present

## 2020-06-24 DIAGNOSIS — A4151 Sepsis due to Escherichia coli [E. coli]: Secondary | ICD-10-CM | POA: Diagnosis not present

## 2020-06-24 LAB — BASIC METABOLIC PANEL
Anion gap: 6 (ref 5–15)
BUN: <5 mg/dL — ABNORMAL LOW (ref 8–23)
CO2: 25 mmol/L (ref 22–32)
Calcium: 8 mg/dL — ABNORMAL LOW (ref 8.9–10.3)
Chloride: 108 mmol/L (ref 98–111)
Creatinine, Ser: 0.49 mg/dL — ABNORMAL LOW (ref 0.61–1.24)
GFR, Estimated: >60 mL/min (ref >60)
Glucose, Bld: 165 mg/dL — ABNORMAL HIGH (ref 70–99)
Potassium: 4.1 mmol/L (ref 3.5–5.1)
Sodium: 139 mmol/L (ref 135–145)

## 2020-06-24 LAB — GLUCOSE, CAPILLARY
Glucose-Capillary: 158 mg/dL — ABNORMAL HIGH (ref 70–99)
Glucose-Capillary: 157 mg/dL — ABNORMAL HIGH (ref 70–99)
Glucose-Capillary: 167 mg/dL — ABNORMAL HIGH (ref 70–99)
Glucose-Capillary: 161 mg/dL — ABNORMAL HIGH (ref 70–99)

## 2020-06-24 LAB — MAGNESIUM: Magnesium: 1.9 mg/dL (ref 1.7–2.4)

## 2020-06-24 NOTE — Progress Notes (Signed)
PROGRESS NOTE    [name]  TFT:[id number] DOB: 1950/06/07 DOA: 06/15/2020 PCP: Pearson Grippe, MD   Brief Narrative:  As per H&P written by Dr. Flossie Dibble on 06/15/2020 [name] a 70 y.o.malewith medical history significant ofchronic nonverbal at baseline hyperlipidemia, hypertension, chronic renal retention with periodic self catheterization, recurrent UTI, BPH, history of kidney stones, periodic constipation uses laxatives,previous AKI, electrolyte normalities.... History was provided by guardian, electronic records reviewed Presented for abdominal pain-fever x2 days.  Assessment & Plan:   Principal Problem:   SIRS (systemic inflammatory response syndrome) (HCC) Active Problems:   Urine retention   Intellectual disability   Hyperlipidemia   HTN (hypertension)   Diabetes mellitus type 2, noninsulin dependent (HCC)   UTI (urinary tract infection)   BPH (benign prostatic hyperplasia)   Nonverbal   COVID-19 virus infection   1-sepsis secondary to ESBL E. coli, Klebsiella pneumoniaUTI; E. coli bacteremiaand C. difficile infection -Positive E. coli bacteremia most likely coming from urinary tract. -Patient also experiencing abdominal pain, distention and diarrhea prior to admission; positive C. difficile PCR antigen and toxin. -Continue IV fluids and supportive care -continue holding on laxatives regimen.  -Stop/avoid PPI's -repeat fosfomycin3/9, now completed -continue rocephinfor E. coli bacteremia ( day pending). Currently day9of 10. -Follow culture results and sensitivity. -Continue to follow clinical response. -Planning to complete 7 days of oral vancomycin at time of discharge; after IV antibiotics for bacteremia finalized.Currently day 5/7.  2-Urine retention -Chronically requiring in and out catheterizations at home -Foley catheter has been placed at time of admission. -Continue Flomax.  3-Intellectual disability/cognitive  impairment -Nonverbal at baseline -Able to follow commands -Continue supportive care -plan is for return home with family care when medically stable ( day)  4-history of BPH -Continue Flomax. -continue finasteride -foley catheter in place secondary to retention   5-hypertension: Presented with systolic blood pressure in admission -Continue IV fluid -Continue holding antihypertensive agents.  6-hyperlipidemia -Continue statins.  7-COVID-19 infection -No URI symptoms -Oxygen saturation 92% on room air -Reports patient has received COVID vaccine and booster. -Continue supportive care -No remdesivir or steroids at this time.  8-Abdominal gaseous distention-much improved -Encouraging ambulation -KUB on 3/10 still with signs of adynamic ileus -Tolerating diet   DVT prophylaxis:Heparin Code Status:DNR Family Communication:Discussed with aunt on phone 3/11 Disposition Plan: Status is: Inpatient  Remains inpatient appropriate because:IV treatments appropriate due to intensity of illness or inability to take PO and Inpatient level of care appropriate due to severity of illness   Dispo: The patient is from:Home Anticipated d/c is HC:[facility] Patient currently is not medically stable to d/c. Difficult to place patient No   Skin Assessment:  I have examined the patient's skin and I agree with the wound assessment as performed by the wound care RN as outlined below:  Pressure Injury 06/15/20 Buttocks Right Stage 2 - Partial thickness loss of dermis presenting as a shallow open injury with a red, pink wound bed without slough. (Active)  06/15/20 1430  Location: Buttocks  Location Orientation: Right  Staging: Stage 2 - Partial thickness loss of dermis presenting as a shallow open injury with a red, pink wound bed without slough.  Wound Description (Comments):   Present on Admission: Yes     Consultants:  None  Procedures:  See below  Antimicrobials:  Anti-infectives (From admission, onward)   Start     Dose/Rate Route Frequency Ordered Stop   06/20/20 1230  vancomycin (VANCOCIN) 50 mg/mL oral solution 125 mg  125 mg Oral Every 6 hours 06/20/20 1141 06/30/20 1759   06/19/20 1015  fosfomycin (MONUROL) packet 3 g        3 g Oral every 72 hours 06/19/20 0927 06/22/20 1002   06/16/20 1730  vancomycin (VANCOCIN) 50 mg/mL oral solution 500 mg  Status:  Discontinued        500 mg Oral Every 6 hours 06/16/20 1533 06/20/20 1141   06/16/20 1630  metroNIDAZOLE (FLAGYL) IVPB 500 mg  Status:  Discontinued        500 mg 100 mL/hr over 60 Minutes Intravenous Every 8 hours 06/16/20 1533 06/20/20 0952   06/16/20 1100  cefTRIAXone (ROCEPHIN) 2 g in sodium chloride 0.9 % 100 mL IVPB        2 g 200 mL/hr over 30 Minutes Intravenous Every 24 hours 06/16/20 1003     06/16/20 1000  cefTRIAXone (ROCEPHIN) 1 g in sodium chloride 0.9 % 100 mL IVPB  Status:  Discontinued        1 g 200 mL/hr over 30 Minutes Intravenous Every 24 hours 06/15/20 1238 06/16/20 0310   06/16/20 0400  Ampicillin-Sulbactam (UNASYN) 3 g in sodium chloride 0.9 % 100 mL IVPB  Status:  Discontinued        3 g 200 mL/hr over 30 Minutes Intravenous Every 6 hours 06/16/20 0310 06/16/20 1003   06/15/20 2200  nitrofurantoin (MACRODANTIN) capsule 50 mg  Status:  Discontinued        50 mg Oral Daily at bedtime 06/15/20 1241 06/16/20 1714   06/15/20 0945  cefTRIAXone (ROCEPHIN) 1 g in sodium chloride 0.9 % 100 mL IVPB        1 g 200 mL/hr over 30 Minutes Intravenous  Once 06/15/20 0940 06/15/20 1120       Subjective: Patient seen and evaluated today and overall appears to be doing well with less abdominal distention noted.  No issues overnight.  Objective: Vitals:   06/23/20 2055 06/24/20 0338 06/24/20 0541 06/24/20 0600  BP: (!) 109/56  (!) 108/55   Pulse: 98  84   Resp: 20  18   Temp: 97.7 F (36.5  C)  98 F (36.7 C)   TempSrc:      SpO2: 97%     Weight:  72.1 kg  72.5 kg  Height:        Intake/Output Summary (Last 24 hours) at 06/24/2020 1118 Last data filed at 06/24/2020 0600 Gross per 24 hour  Intake 600 ml  Output 2350 ml  Net -1750 ml   Filed Weights   06/23/20 0500 06/24/20 0338 06/24/20 0600  Weight: 72 kg 72.1 kg 72.5 kg    Examination:  General exam: Appears calm and comfortable  Respiratory system: Clear to auscultation. Respiratory effort normal. Cardiovascular system: S1 & S2 heard, RRR.  Gastrointestinal system: Abdomen is soft, minimally distended Central nervous system: Alert and awake Extremities: No edema Skin: No significant lesions noted Psychiatry: Flat affect.    Data Reviewed: I have personally reviewed following labs and imaging studies  CBC: Recent Labs  Lab 06/18/20 0549 06/19/20 0555 06/23/20 0545  WBC 9.2 6.5 8.4  HGB 9.2* 9.5* 11.2*  HCT 30.6* 30.6* 37.0*  MCV 89.0 88.4 87.5  PLT 130* 161 378   Basic Metabolic Panel: Recent Labs  Lab 06/18/20 0549 06/19/20 0555 06/22/20 0837 06/23/20 0545 06/24/20 0603  NA 139 141 137 137 139  K 3.6 3.9 3.8 3.6 4.1  CL 108 111 107 105 108  CO2 21*  GLUCOSE 135* 153* 164* 159* 165*  BUN 17 17 5* <5* <5*  CREATININE 0.73 0.70 0.50* 0.51* 0.49*  CALCIUM 8.0* 8.0* 8.3* 8.7* 8.0*  MG 2.1 1.9 1.8 1.8 1.9   GFR: Estimated Creatinine Clearance: 80.3 mL/min (A) (by C-G formula based on SCr of 0.49 mg/dL (L)). Liver Function Tests: No results for input(s): AST, ALT, ALKPHOS, BILITOT, PROT, ALBUMIN in the last 168 hours. No results for input(s): LIPASE, AMYLASE in the last 168 hours. No results for input(s): AMMONIA in the last 168 hours. Coagulation Profile: No results for input(s): INR, PROTIME in the last 168 hours. Cardiac Enzymes: No results for input(s): CKTOTAL, CKMB, CKMBINDEX, TROPONINI in the last 168 hours. BNP (last 3 results) No results for input(s): PROBNP in  the last 8760 hours. HbA1C: No results for input(s): HGBA1C in the last 72 hours. CBG: Recent Labs  Lab 06/23/20 0817 06/23/20 1149 06/23/20 1629 06/23/20 2153 06/24/20 0758  GLUCAP 154* 169* 193* 214* 158*   Lipid Profile: No results for input(s): CHOL, HDL, LDLCALC, TRIG, CHOLHDL, LDLDIRECT in the last 72 hours. Thyroid Function Tests: No results for input(s): TSH, T4TOTAL, FREET4, T3FREE, THYROIDAB in the last 72 hours. Anemia Panel: No results for input(s): VITAMINB12, FOLATE, FERRITIN, TIBC, IRON, RETICCTPCT in the last 72 hours. Sepsis Labs: No results for input(s): PROCALCITON, LATICACIDVEN in the last 168 hours.  Recent Results (from the past 240 hour(s))  Blood Culture (routine x 2)     Status: Abnormal   Collection Time: 06/15/20  8:58 AM   Specimen: BLOOD  Result Value Ref Range Status   Specimen Description   Final    BLOOD BLOOD RIGHT FOREARM Performed at [facility], [address], [place], [place] [address]    Special Requests   Final    Blood Culture adequate volume BOTTLES DRAWN AEROBIC AND ANAEROBIC Performed at [facility], [address], [place], [place] [address]    Culture  Setup Time   Final    IN BOTH AEROBIC AND ANAEROBIC BOTTLES GRAM NEGATIVE RODS Gram Stain Report Called to,Read Back By and Verified With: R WAGONER,RN@0252  06/16/20 MKELLY CRITICAL VALUE NOTED.  VALUE IS CONSISTENT WITH PREVIOUSLY REPORTED AND CALLED VALUE.    Culture (A)  Final    ESCHERICHIA COLI SUSCEPTIBILITIES PERFORMED ON PREVIOUS CULTURE WITHIN THE LAST 5 DAYS. Performed at [facility], [address], [place], [place] [address]    Report Status 06/18/2020 FINAL  Final  Resp Panel by RT-PCR (Flu A&B, Covid) Nasopharyngeal Swab     Status: Abnormal   Collection Time: 06/15/20  9:08 AM   Specimen: Nasopharyngeal Swab; Nasopharyngeal(NP) swabs in vial transport medium  Result Value Ref Range Status   SARS Coronavirus 2 by RT PCR POSITIVE (A) NEGATIVE Final     Comment: RESULT CALLED TO, READ BACK BY AND VERIFIED WITH: E. GANT 3/2  S. BEARD (NOTE) SARS-CoV-2 target nucleic acids are DETECTED.  The SARS-CoV-2 RNA is generally detectable in upper respiratory specimens during the acute phase of infection. Positive results are indicative of the presence of the identified virus, but do not rule out bacterial infection or co-infection with other pathogens not detected by the test. Clinical correlation with patient history and other diagnostic information is necessary to determine patient infection status. The expected result is Negative.  Fact Sheet for Patients: BloggerCourse.com  Fact Sheet for Healthcare Providers: SeriousBroker.it  This test is not yet approved or cleared by the [place] and  has been authorized for detection and/or diagnosis of SARS-CoV-2 by FDA under an Emergency Use Authorization (EUA).  This EUA will remain in effect (meaning this test can be used)  for the duration of  the COVID-19 declaration under Section 564(b)(1) of the Act, 21 U.S.C. section 360bbb-3(b)(1), unless the authorization is terminated or revoked sooner.     Influenza A by PCR NEGATIVE NEGATIVE Final   Influenza B by PCR NEGATIVE NEGATIVE Final    Comment: (NOTE) The Xpert Xpress SARS-CoV-2/FLU/RSV plus assay is intended as an aid in the diagnosis of influenza from Nasopharyngeal swab specimens and should not be used as a sole basis for treatment. Nasal washings and aspirates are unacceptable for Xpert Xpress SARS-CoV-2/FLU/RSV testing.  Fact Sheet for Patients: BloggerCourse.com  Fact Sheet for Healthcare Providers: SeriousBroker.it  This test is not yet approved or cleared by the [place] FDA and has been authorized for detection and/or diagnosis of SARS-CoV-2 by FDA under an Emergency Use Authorization (EUA). This EUA will  remain in effect (meaning this test can be used) for the duration of the COVID-19 declaration under Section 564(b)(1) of the Act, 21 U.S.C. section 360bbb-3(b)(1), unless the authorization is terminated or revoked.  Performed at [facility], [address], [place], [place] [address]   Blood Culture (routine x 2)     Status: Abnormal   Collection Time: 06/15/20  9:11 AM   Specimen: BLOOD  Result Value Ref Range Status   Specimen Description   Final    BLOOD BLOOD LEFT WRIST Performed at [facility], [address] [place], [place] [address]    Special Requests   Final    Blood Culture adequate volume BOTTLES DRAWN AEROBIC AND ANAEROBIC Performed at [facility], [address] [place], [place] [address]    Culture  Setup Time   Final    IN BOTH AEROBIC AND ANAEROBIC BOTTLES GRAM NEGATIVE RODS Gram Stain Report Called to,Read Back By and Verified With: R WAGONER,RN  06/16/20 MKELLY CRITICAL RESULT CALLED TO, READ BACK BY AND VERIFIED WITH: PHARMD rbv steven hurst [phone] FCP Performed at [facility], [address], [place], [place] [address]    Culture ESCHERICHIA COLI (A)  Final   Report Status 06/18/2020 FINAL  Final   Organism ID, Bacteria ESCHERICHIA COLI  Final      Susceptibility   Escherichia coli - MIC*    AMPICILLIN 8 SENSITIVE Sensitive     CEFAZOLIN <=4 SENSITIVE Sensitive     CEFEPIME <=0.12 SENSITIVE Sensitive     CEFTAZIDIME <=1 SENSITIVE Sensitive     CEFTRIAXONE <=0.25 SENSITIVE Sensitive     CIPROFLOXACIN >=4 RESISTANT Resistant     GENTAMICIN <=1 SENSITIVE Sensitive     IMIPENEM 0.5 SENSITIVE Sensitive     TRIMETH/SULFA >=320 RESISTANT Resistant     AMPICILLIN/SULBACTAM 4 SENSITIVE Sensitive     PIP/TAZO <=4 SENSITIVE Sensitive     * ESCHERICHIA COLI  Blood Culture ID Panel (Reflexed)     Status: Abnormal   Collection Time: 06/15/20  9:11 AM  Result Value Ref Range Status   Enterococcus faecalis NOT DETECTED NOT DETECTED Final    Enterococcus Faecium NOT DETECTED NOT DETECTED Final   Listeria monocytogenes NOT DETECTED NOT DETECTED Final   Staphylococcus species NOT DETECTED NOT DETECTED Final   Staphylococcus aureus (BCID) NOT DETECTED NOT DETECTED Final   Staphylococcus epidermidis NOT DETECTED NOT DETECTED Final   Staphylococcus lugdunensis NOT DETECTED NOT DETECTED Final   Streptococcus species NOT  DETECTED NOT DETECTED Final   Streptococcus agalactiae NOT DETECTED NOT DETECTED Final   Streptococcus pneumoniae NOT DETECTED NOT DETECTED Final   Streptococcus pyogenes NOT DETECTED NOT DETECTED Final   A.calcoaceticus-baumannii NOT DETECTED NOT DETECTED Final   Bacteroides fragilis NOT DETECTED NOT DETECTED Final   Enterobacterales DETECTED (A) NOT DETECTED Final    Comment: Enterobacterales represent a large order of gram negative bacteria, not a single organism. CRITICAL RESULT CALLED TO, READ BACK BY AND VERIFIED WITH: PHARMD D rbv steven hurst [phone] FCP    Enterobacter cloacae complex NOT DETECTED NOT DETECTED Final   Escherichia coli DETECTED (A) NOT DETECTED Final    Comment: CRITICAL RESULT CALLED TO, READ BACK BY AND VERIFIED WITH: PHARMD D rbv steven hurst [id number] FCP    Klebsiella aerogenes NOT DETECTED NOT DETECTED Final   Klebsiella oxytoca NOT DETECTED NOT DETECTED Final   Klebsiella pneumoniae NOT DETECTED NOT DETECTED Final   Proteus species NOT DETECTED NOT DETECTED Final   Salmonella species NOT DETECTED NOT DETECTED Final   Serratia marcescens NOT DETECTED NOT DETECTED Final   Haemophilus influenzae NOT DETECTED NOT DETECTED Final   Neisseria meningitidis NOT DETECTED NOT DETECTED Final   Pseudomonas aeruginosa NOT DETECTED NOT DETECTED Final   Stenotrophomonas maltophilia NOT DETECTED NOT DETECTED Final   Candida albicans NOT DETECTED NOT DETECTED Final   Candida auris NOT DETECTED NOT DETECTED Final   Candida glabrata NOT DETECTED NOT DETECTED Final   Candida krusei NOT  DETECTED NOT DETECTED Final   Candida parapsilosis NOT DETECTED NOT DETECTED Final   Candida tropicalis NOT DETECTED NOT DETECTED Final   Cryptococcus neoformans/gattii NOT DETECTED NOT DETECTED Final   CTX-M ESBL NOT DETECTED NOT DETECTED Final   Carbapenem resistance IMP NOT DETECTED NOT DETECTED Final   Carbapenem resistance KPC NOT DETECTED NOT DETECTED Final   Carbapenem resistance NDM NOT DETECTED NOT DETECTED Final   Carbapenem resist OXA 48 LIKE NOT DETECTED NOT DETECTED Final   Carbapenem resistance VIM NOT DETECTED NOT DETECTED Final    Comment: Performed at [facility], [address], [place], [place] [address]  Urine culture     Status: Abnormal   Collection Time: 06/15/20  9:12 AM   Specimen: Urine, Catheterized  Result Value Ref Range Status   Specimen Description   Final    URINE, CATHETERIZED Performed at [facility], [address] [place], [place] [address]    Special Requests   Final    NONE Performed at [facility], [address], [place], [place] [address]    Culture (A)  Final    >=100,000 COLONIES/mL KLEBSIELLA PNEUMONIAE >=100,000 COLONIES/mL ESCHERICHIA COLI Confirmed Extended Spectrum Beta-Lactamase Producer (ESBL).  In bloodstream infections from ESBL organisms, carbapenems are preferred over piperacillin/tazobactam. They are shown to have a lower risk of mortality. FOR KLEBSIELLA PNEUMONIAE    Report Status 06/18/2020 FINAL  Final   Organism ID, Bacteria KLEBSIELLA PNEUMONIAE (A)  Final   Organism ID, Bacteria ESCHERICHIA COLI (A)  Final      Susceptibility   Escherichia coli - MIC*    AMPICILLIN 16 INTERMEDIATE Intermediate     CEFAZOLIN <=4 SENSITIVE Sensitive     CEFEPIME <=0.12 SENSITIVE Sensitive     CEFTRIAXONE <=0.25 SENSITIVE Sensitive     CIPROFLOXACIN >=4 RESISTANT Resistant     GENTAMICIN <=1 SENSITIVE Sensitive     IMIPENEM <=0.25 SENSITIVE Sensitive     NITROFURANTOIN <=16 SENSITIVE Sensitive     TRIMETH/SULFA <=20 SENSITIVE  Sensitive     AMPICILLIN/SULBACTAM 4 SENSITIVE Sensitive     PIP/TAZO 64 INTERMEDIATE Intermediate     * >=100,000 COLONIES/mL ESCHERICHIA COLI   Klebsiella pneumoniae - MIC*    AMPICILLIN >=32 RESISTANT Resistant     CEFAZOLIN >=64 RESISTANT Resistant     CEFEPIME >=32 RESISTANT Resistant     CEFTRIAXONE >=64 RESISTANT Resistant     CIPROFLOXACIN >=4 RESISTANT Resistant     GENTAMICIN <=1 SENSITIVE Sensitive     IMIPENEM <=0.25 SENSITIVE Sensitive     NITROFURANTOIN 256 RESISTANT Resistant     TRIMETH/SULFA 80 RESISTANT Resistant     AMPICILLIN/SULBACTAM >=32 RESISTANT Resistant     PIP/TAZO 16 SENSITIVE Sensitive     * >=100,000 COLONIES/mL KLEBSIELLA PNEUMONIAE  MRSA PCR Screening     Status: None   Collection Time: 06/15/20  2:03 PM   Specimen: Nasopharyngeal  Result Value Ref Range Status   MRSA by PCR NEGATIVE NEGATIVE Final    Comment:        The GeneXpert MRSA Assay (FDA approved for NASAL specimens only), is one component of a comprehensive MRSA colonization surveillance program. It is not intended to diagnose MRSA infection nor to guide or monitor treatment for MRSA infections. Performed at [facility], [address], [place], [place] [address]   C Difficile Quick Screen w PCR reflex     Status: Abnormal   Collection Time: 06/16/20  8:58 AM   Specimen: STOOL  Result Value Ref Range Status   C Diff antigen POSITIVE (A) NEGATIVE Final    Comment: CRITICAL RESULT CALLED TO, READ BACK BY AND VERIFIED WITH: LARIMORE A AT 1520 06/16/2020 RC    C Diff toxin POSITIVE (A) NEGATIVE Final    Comment: CRITICAL RESULT CALLED TO, READ BACK BY AND VERIFIED WITH: LARIMORE A AT 1520 06/16/2020 RC    C Diff interpretation Toxin producing C. difficile detected.  Final    Comment: Performed at [facility], [address] [place], [place] [address]  Gastrointestinal Panel by PCR , Stool     Status: Abnormal   Collection Time: 06/16/20  1:53 PM   Specimen: STOOL  Result Value  Ref Range Status   Campylobacter species NOT DETECTED NOT DETECTED Final   Plesimonas shigelloides NOT DETECTED NOT DETECTED Final   Salmonella species NOT DETECTED NOT DETECTED Final   Yersinia enterocolitica NOT DETECTED NOT DETECTED Final   Vibrio species NOT DETECTED NOT DETECTED Final   Vibrio cholerae NOT DETECTED NOT DETECTED Final   Enteroaggregative E coli (EAEC) NOT DETECTED NOT DETECTED Final   Enteropathogenic E coli (EPEC) NOT DETECTED NOT DETECTED Final   Enterotoxigenic E coli (ETEC) NOT DETECTED NOT DETECTED Final   Shiga like toxin producing E coli (STEC) NOT DETECTED NOT DETECTED Final   Shigella/Enteroinvasive E coli (EIEC) NOT DETECTED NOT DETECTED Final   Cryptosporidium NOT DETECTED NOT DETECTED Final   Cyclospora cayetanensis NOT DETECTED NOT DETECTED Final   Entamoeba histolytica NOT DETECTED NOT DETECTED Final   Giardia lamblia NOT DETECTED NOT DETECTED Final   Adenovirus F40/41 DETECTED (A) NOT DETECTED Final   Astrovirus NOT DETECTED NOT DETECTED Final   Norovirus GI/GII NOT DETECTED NOT DETECTED Final   Rotavirus A NOT DETECTED NOT DETECTED Final   Sapovirus (I, II, IV, and V) NOT DETECTED NOT DETECTED Final    Comment: Performed at [facility], [address], [name], [place] [address]         Radiology Studies: DG Abd 1 View  Result Date: 06/23/2020 CLINICAL DATA:  Abdominal distention. EXAM: ABDOMEN - 1 VIEW COMPARISON:  06/16/2020.  CT 06/15/2020. FINDINGS: Surgical clips noted right upper quadrant and pelvis. Persistent distended loops of small large bowel noted most consistent adynamic ileus. Continued follow-up exams suggested to demonstrate resolution and to exclude erosion. Left nephrolithiasis again noted. Pelvic calcifications consistent phleboliths. Degenerative changes lumbar spine and both hips. Bibasilar atelectasis. IMPRESSION: 1. Persistent distended loops of small and large bowel noted most consistent adynamic ileus. Continued  follow-up exams suggested to demonstrate resolution and to exclude erosion. 2. Left nephrolithiasis again noted. Electronically Signed   By: Maisie Fushomas  Register   On: 06/23/2020 15:13        Scheduled Meds: . acidophilus  1 capsule Oral Daily  . aspirin  81 mg Per Tube Daily  . atorvastatin  20 mg Oral QHS  . Chlorhexidine Gluconate Cloth  6 each Topical Q0600  . ferrous sulfate  325 mg Oral Daily  . finasteride  5 mg Oral Daily  . heparin  5,000 Units Subcutaneous Q8H  . insulin aspart  0-9 Units Subcutaneous TID WC  . linaclotide  145 mcg Oral QAC breakfast  . magnesium oxide  400 mg Oral Daily  . simethicone  80 mg Oral TID  . sodium chloride flush  3 mL Intravenous Q12H  . sodium chloride flush  3 mL Intravenous Q12H  . tamsulosin  0.4 mg Oral Daily  . vancomycin  125 mg Oral Q6H   Continuous Infusions: . sodium chloride    . 0.9 % NaCl with KCl 40 mEq / L 75 mL/hr at 06/24/20 0535  . cefTRIAXone (ROCEPHIN)  IV 2 g (06/24/20 0903)     LOS: 9 days    Time spent: 35 minutes    Stesha Neyens D Sherryll BurgerShah, DO [facility]  If 7PM-7AM, please contact night-coverage [email or web address] 06/24/2020, 11:18 AM

## 2020-06-24 NOTE — Progress Notes (Signed)
Physical Therapy Treatment Patient Details Name: [name] MRN: [id number] DOB: 1950-08-28 Today's Date: 06/24/2020    History of Present Illness HILMAN KISSLING is a 70 y.o. male with medical history significant of chronic nonverbal at baseline hyperlipidemia, hypertension, chronic renal retention with periodic self catheterization, recurrent UTI, BPH, history of kidney stones, periodic constipation uses laxatives, previous AKI, electrolyte normalities....  History was provided by guardian, electronic records reviewed  Presented for abdominal pain-fever x2 days.    PT Comments    Patient requires increased time with frequent rest breaks during supine to sitting with bed flat, has to use bed rail with Min/mod assist.  Patient limited to a few side steps before having to sit due to fatigue and has difficulty extending trunk due to generalized weakness.  Patient tolerated sitting up in chair after physical therapy to continue working with OT.  Patient will benefit from continued physical therapy in hospital and recommended venue below to increase strength, balance, endurance for safe ADLs and gait.    Follow Up Recommendations  SNF;Supervision for mobility/OOB;Supervision - Intermittent     Equipment Recommendations  None recommended by PT    Recommendations for Other Services       Precautions / Restrictions Precautions Precautions: Fall Restrictions Weight Bearing Restrictions: No    Mobility  Bed Mobility Overal bed mobility: Needs Assistance Bed Mobility: Supine to Sit Rolling:  (Provided by PT)   Supine to sit: Min assist;Mod assist     General bed mobility comments: increased time, frequent rest breaks, labored movement    Transfers Overall transfer level: Needs assistance Equipment used: Rolling walker (2 wheeled) Transfers: Sit to/from [employer] Sit to Stand: Min guard;Min assist Stand pivot transfers: Min assist       General transfer  comment: labored movement, flexed trunk, increased time  Ambulation/Gait Ambulation/Gait assistance: Mod assist Gait Distance (Feet): 5 Feet Assistive device: Rolling walker (2 wheeled) Gait Pattern/deviations: Decreased step length - right;Decreased step length - left;Decreased stride length;Trunk flexed Gait velocity: slow   General Gait Details: limited to 5-6 slow labored side steps before having to sit due to fatigue   Stairs             Wheelchair Mobility    Modified Rankin (Stroke Patients Only)       Balance Overall balance assessment: Needs assistance Sitting-balance support: Feet supported;Bilateral upper extremity supported;No upper extremity supported Sitting balance-[name]: Good Sitting balance - Comments: EOB   Standing balance support: During functional activity;Bilateral upper extremity supported Standing balance-[name]: Poor Standing balance comment: fair/poor using RW                            Cognition Arousal/Alertness: Awake/alert Behavior During Therapy: WFL for tasks assessed/performed Overall Cognitive Status: Within Functional Limits for tasks assessed                                 General Comments: Some difficulty following command for shoulder A/ROM.      Exercises General Exercises - Lower Extremity Ankle Circles/Pumps: Seated;AROM;Strengthening;Both;10 reps Long Arc Quad: Seated;AROM;Strengthening;Both;10 reps Hip Flexion/Marching: Seated;AROM;Strengthening;Both;10 reps Other Exercises Other Exercises: Completed 4 reps of sit to stand with prolonged standing. Kyphotic posture persists. Standing times as follows with RW and min guard to min assist: 38", 29", 1', 20"    General Comments  Pertinent Vitals/Pain Pain Assessment: Faces Faces Pain Scale: Hurts a little bit Pain Location: stomach Pain Descriptors / Indicators: Sore;Grimacing;Discomfort Pain Intervention(s): Limited  activity within patient's tolerance;Monitored during session    Home Living                      Prior Function            PT Goals (current goals can now be found in the care plan section) Acute Rehab PT Goals Patient Stated Goal: not stated (non verbal) PT Goal Formulation: With patient Time For Goal Achievement: 06/30/20 Potential to Achieve Goals: Good    Frequency    Min 3X/week      PT Plan Current plan remains appropriate    Co-evaluation PT/OT/SLP Co-Evaluation/Treatment: Yes Reason for Co-Treatment: Complexity of the patient's impairments (multi-system involvement);For patient/therapist safety PT goals addressed during session: Mobility/safety with mobility;Balance;Strengthening/ROM;Proper use of DME OT goals addressed during session: Strengthening/ROM;ADL's and self-care      AM-PAC PT "6 Clicks" Mobility   Outcome Measure  Help needed turning from your back to your side while in a flat bed without using bedrails?: A Little Help needed moving from lying on your back to sitting on the side of a flat bed without using bedrails?: A Little Help needed moving to and from a bed to a chair (including a wheelchair)?: A Lot Help needed standing up from a chair using your arms (e.g., wheelchair or bedside chair)?: A Lot Help needed to walk in hospital room?: A Lot Help needed climbing 3-5 steps with a railing? : Total 6 Click Score: 13    End of Session   Activity Tolerance: Patient tolerated treatment well;Patient limited by fatigue Patient left: in bed;with call bell/phone within reach;with bed alarm set Nurse Communication: Mobility status PT Visit Diagnosis: Unsteadiness on feet (R26.81);Other abnormalities of gait and mobility (R26.89);Muscle weakness (generalized) (M62.81)     Time: 0488-8916 PT Time Calculation (min) (ACUTE ONLY): 23 min  Charges:  $Therapeutic Exercise: 8-22 mins $Therapeutic Activity: 8-22 mins                     2:12 PM,  06/24/20 Ocie Bob, MPT Physical Therapist with [facility] [phone] office [phone] mobile phone

## 2020-06-24 NOTE — Progress Notes (Signed)
Occupational Therapy Treatment Patient Details Name: [name] MRN: [id number] DOB: [birth year] Today's Date: 06/24/2020    History of present illness [name] is a 70 y.o. male with medical history significant of chronic nonverbal at baseline hyperlipidemia, hypertension, chronic renal retention with periodic self catheterization, recurrent UTI, BPH, history of kidney stones, periodic constipation uses laxatives, previous AKI, electrolyte normalities....  History was provided by guardian, electronic records reviewed  Presented for abdominal pain-fever x2 days.   OT comments  Pt agreeable to treatment. Pt required more assist for bed mobility during co-treat with PT. Minimal to moderate assist for supine to sit. Pt engaged in LE exercises with PT (see PT treat). Pt prompted to complete B UE shoulder flexion but struggled to follow this direction and would only lift L UE at shoulder at R UR was lifted at elbow possibly due to leaning in chair when shoulder flexion was attempted. P/ROM to L UE noted [facility] today. R UE still limited. Pt able to static stand for 4 trials with maximum standing time of 1 min using RW with Min guard to min assist. Kyphotic posture persists despite attempted facilitation for trunk extension in standing. Pt will benefit from continued OT services to address strength, endurance, and ROM to increase functional ADL status.   Follow Up Recommendations  Home health OT;Supervision/Assistance - 24 hour;SNF    Equipment Recommendations  None recommended by OT    Recommendations for Other Services      Precautions / Restrictions Precautions Precautions: Fall Restrictions Weight Bearing Restrictions: No       Mobility Bed Mobility Overal bed mobility: Needs Assistance Bed Mobility: Sit to Supine Rolling:  (Provided by PT)   Supine to sit: Min assist;Mod assist (Provided by PT)     General bed mobility comments: slow labored movement with difficulty  repositioning BLE    Transfers Overall transfer level: Needs assistance Equipment used: Rolling walker (2 wheeled) Transfers: Sit to/from [employer] Sit to Stand: Min guard;Min assist Stand pivot transfers: Min assist            Balance Overall balance assessment: Needs assistance Sitting-balance support: Feet supported;No upper extremity supported Sitting balance-[name]: Good Sitting balance - Comments: EOB   Standing balance support: During functional activity;Bilateral upper extremity supported Standing balance-[name]: Poor Standing balance comment: using RW                           ADL either performed or assessed with clinical judgement   ADL       Grooming: Modified independent Grooming Details (indicate cue type and reason): Able to use deodorant under B UE. Able to unscrew cap and place deodorant without assist.                 Toilet Transfer: Min guard;Minimal assistance Toilet Transfer Details (indicate cue type and reason): Simulated via EOB to chair transfer using RW. Stabilization or RW needed at times.                                   Cognition Arousal/Alertness: Awake/alert Behavior During Therapy: WFL for tasks assessed/performed Overall Cognitive Status: Within Functional Limits for tasks assessed                                 General  Comments: Some difficulty following command for shoulder A/ROM.        Exercises Other Exercises Other Exercises: Completed 4 reps of sit to stand with prolonged standing. Kyphotic posture persists. Standing times as follows with RW and min guard to min assist: 38", 29", 1', 20"          General Comments      Pertinent Vitals/ Pain       Pain Assessment: Faces Faces Pain Scale: Hurts a little bit Pain Location: During functional movement. Unsure of exact location. Pain Descriptors / Indicators: Sore;Grimacing;Discomfort Pain  Intervention(s): Monitored during session                                                          Frequency  Min 2X/week        Progress Toward Goals  OT Goals(current goals can now be found in the care plan section)  Progress towards OT goals: Progressing toward goals  Acute Rehab OT Goals Patient Stated Goal: not stated (non verbal) OT Goal Formulation: Patient unable to participate in goal setting Time For Goal Achievement: 06/30/20 Potential to Achieve Goals: Good ADL Goals Pt Will Perform Grooming: with supervision;sitting (Met this date via applying deodorant in sitting with Mod I.) Pt Will Perform Upper Body Dressing: with supervision;sitting Pt Will Perform Lower Body Dressing: with min assist;sitting/lateral leans;sit to/from stand Pt Will Transfer to Toilet: with min guard assist;stand pivot transfer;with min assist Pt/caregiver will Perform Home Exercise Program: Increased strength;Both right and left upper extremity;With Supervision  Plan Discharge plan remains appropriate    Co-evaluation    PT/OT/SLP Co-Evaluation/Treatment: Yes Reason for Co-Treatment: To address functional/ADL transfers   OT goals addressed during session: Strengthening/ROM;ADL's and self-care                         End of Session Equipment Utilized During Treatment: Rolling walker  OT Visit Diagnosis: Unsteadiness on feet (R26.81);Muscle weakness (generalized) (M62.81)   Activity Tolerance Patient tolerated treatment well   Patient Left in chair;with call bell/phone within reach;with chair alarm set             Time: 1114-1140 OT Time Calculation (min): 26 min  Charges: OT General Charges $OT Visit: 1 Visit OT Treatments $Therapeutic Exercise: 23-37 mins  [name] OT, MOT    [name] 06/24/2020, 12:09 PM

## 2020-06-25 DIAGNOSIS — A4151 Sepsis due to Escherichia coli [E. coli]: Secondary | ICD-10-CM | POA: Diagnosis not present

## 2020-06-25 DIAGNOSIS — N390 Urinary tract infection, site not specified: Secondary | ICD-10-CM | POA: Diagnosis not present

## 2020-06-25 DIAGNOSIS — R6510 Systemic inflammatory response syndrome (SIRS) of non-infectious origin without acute organ dysfunction: Secondary | ICD-10-CM | POA: Diagnosis not present

## 2020-06-25 LAB — BASIC METABOLIC PANEL
Anion gap: 5 (ref 5–15)
BUN: <5 mg/dL — ABNORMAL LOW (ref 8–23)
CO2: 26 mmol/L (ref 22–32)
Calcium: 8.2 mg/dL — ABNORMAL LOW (ref 8.9–10.3)
Chloride: 106 mmol/L (ref 98–111)
Creatinine, Ser: 0.55 mg/dL — ABNORMAL LOW (ref 0.61–1.24)
GFR, Estimated: >60 mL/min (ref >60)
Glucose, Bld: 151 mg/dL — ABNORMAL HIGH (ref 70–99)
Potassium: 4 mmol/L (ref 3.5–5.1)
Sodium: 137 mmol/L (ref 135–145)

## 2020-06-25 LAB — GLUCOSE, CAPILLARY: Glucose-Capillary: 144 mg/dL — ABNORMAL HIGH (ref 70–99)

## 2020-06-25 LAB — MAGNESIUM: Magnesium: 1.7 mg/dL (ref 1.7–2.4)

## 2020-06-25 MED ORDER — VANCOMYCIN 50 MG/ML ORAL SOLUTION: 125.0000 mg | Freq: Four times a day (QID) | ORAL | 0 refills | Status: AC

## 2020-06-25 MED ORDER — RISAQUAD PO CAPS: 1.0000 | ORAL_CAPSULE | Freq: Every day | ORAL | 0 refills | Status: AC

## 2020-06-25 NOTE — Discharge Summary (Signed)
Physician Discharge Summary  [name] DJS:[id number] DOB: 1950-04-21 DOA: 06/15/2020  PCP: Levi Grippe, MD  Admit date: 06/15/2020  Discharge date: 06/25/2020  Admitted From:Home  Disposition:  Home  Recommendations for Outpatient Follow-up:  1. Follow up with PCP in 1-2 weeks 2. Please obtain BMP/CBC in one week 3. Continue on vancomycin for 2 more days to complete 7-day course of treatment 4. Continue other home medications as prior  Home Health:Yes with PT/RN  Equipment/Devices:None  Discharge Condition:Stable  CODE STATUS: DNR  Diet recommendation: Dysphagia 2  Brief/Interim Summary: As per H&P written by Dr. Flossie [name] on 06/15/2020 [name] a 69 y.o.malewith medical history significant ofchronic nonverbal at baseline hyperlipidemia, hypertension, chronic renal retention with periodic self catheterization, recurrent UTI, BPH, history of kidney stones, periodic constipation uses laxatives,previous AKI, electrolyte normalities.... History was provided by guardian, electronic records reviewed Presented for abdominal pain-fever x2 days.  -Patient was admitted with sepsis secondary to ESBL E. coli and Klebsiella pneumoniae UTI as well as noted E. coli bacteremia and C. difficile infection.  Patient has completed course of treatment for his E. coli bacteremia with 10 days of IV Rocephin and has also completed treatment for his UTI with fosfomycin x2 doses.  He has completed 5 days of oral vancomycin for his C. difficile and will require 2 more days to complete total treatment for his C. difficile colitis.  Additionally, he was noted to have incidental COVID-19 infection from which he was asymptomatic, but required isolation.  He was noted to have abdominal gaseous distention that appears to be a chronic issue for him, but this is stable.  No other acute events have been noted throughout the course of this admission and he is stable for discharge.   Discharge Diagnoses:   Principal Problem:   SIRS (systemic inflammatory response syndrome) (HCC) Active Problems:   Urine retention   Intellectual disability   Hyperlipidemia   HTN (hypertension)   Diabetes mellitus type 2, noninsulin dependent (HCC)   UTI (urinary tract infection)   BPH (benign prostatic hyperplasia)   Nonverbal   COVID-19 virus infection  Principal discharge diagnosis: Sepsis secondary to ESBL E. coli and Klebsiella pneumonia UTI with associated E. coli bacteremia and C. difficile colitis.  Discharge Instructions  Discharge Instructions    Diet - low sodium heart healthy   Complete by: As directed    Increase activity slowly   Complete by: As directed    No wound care   Complete by: As directed      Allergies as of 06/25/2020   No Known Allergies     Medication List    STOP taking these medications   amoxicillin-clavulanate 500-125 MG tablet Commonly known as: Augmentin     TAKE these medications   acetaminophen 500 MG tablet Commonly known as: TYLENOL Take 1,000 mg by mouth every 6 (six) hours as needed for mild pain or headache.   acidophilus Caps capsule Take 1 capsule by mouth daily. Start taking on: June 26, 2020   aspirin EC 81 MG tablet Take 1 tablet (81 mg total) by mouth daily with breakfast.   atorvastatin 20 MG tablet Commonly known as: LIPITOR Take 20 mg by mouth at bedtime.   bisacodyl 10 MG suppository Commonly known as: DULCOLAX Place 1 suppository (10 mg total) rectally daily.   FeroSul 325 (65 FE) MG tablet Generic drug: ferrous sulfate Take 325 mg by mouth daily.   finasteride 5 MG tablet Commonly known as: PROSCAR Take 1 tablet (  5 mg total) by mouth daily.   linaclotide 145 MCG Caps capsule Commonly known as: LINZESS Take 145 mcg by mouth daily before breakfast.   magnesium oxide 400 MG tablet Commonly known as: MAG-OX Take 1 tablet (400 mg total) by mouth daily.   metFORMIN 1000 MG tablet Commonly known as: GLUCOPHAGE Take  1,000 mg by mouth 2 (two) times daily. What changed: Another medication with the same name was removed. Continue taking this medication, and follow the directions you see here.   nitrofurantoin 50 MG capsule Commonly known as: MACRODANTIN Take 1 capsule (50 mg total) by mouth at bedtime.   Potassium Chloride ER 20 MEQ Tbcr Take 20 mEq by mouth every Tuesday, Thursday, Saturday, and Sunday. 1 tab daily by mouth   simethicone 40 MG/0.6ML drops Commonly known as: MYLICON Take 1.2 mLs (80 mg total) by mouth 4 (four) times daily as needed for flatulence.   tamsulosin 0.4 MG Caps capsule Commonly known as: FLOMAX Take 1 capsule (0.4 mg total) by mouth in the morning and at bedtime.   vancomycin 50 mg/mL  oral solution Commonly known as: VANCOCIN Take 2.5 mLs (125 mg total) by mouth every 6 (six) hours for 2 days.       Follow-up Information    Levi Grippe, MD Follow up in 1 week(s).   Specialty: Internal Medicine Contact information: [address] [place] [address] [phone]              No Known Allergies  Consultations:  None   Procedures/Studies: DG Abd 1 View  Result Date: 06/23/2020 CLINICAL DATA:  Abdominal distention. EXAM: ABDOMEN - 1 VIEW COMPARISON:  06/16/2020.  CT 06/15/2020. FINDINGS: Surgical clips noted right upper quadrant and pelvis. Persistent distended loops of small large bowel noted most consistent adynamic ileus. Continued follow-up exams suggested to demonstrate resolution and to exclude erosion. Left nephrolithiasis again noted. Pelvic calcifications consistent phleboliths. Degenerative changes lumbar spine and both hips. Bibasilar atelectasis. IMPRESSION: 1. Persistent distended loops of small and large bowel noted most consistent adynamic ileus. Continued follow-up exams suggested to demonstrate resolution and to exclude erosion. 2. Left nephrolithiasis again noted. Electronically Signed   By: Levi Fus  Register   On: 06/23/2020  15:13   DG Abd 1 View  Result Date: 06/16/2020 CLINICAL DATA:  Abdominal distension. EXAM: ABDOMEN - 1 VIEW COMPARISON:  March 01, 2020.  June 16, 2019. FINDINGS: Stable gaseous distention of large and small bowel is noted most consistent with ileus. At least 2 left renal calculi are noted. IMPRESSION: Stable gaseous distention of large and small bowel most consistent with ileus. Left nephrolithiasis is noted. Electronically Signed   By: Lupita Raider M.D.   On: 06/16/2020 13:50   DG Chest Port 1 View  Result Date: 06/15/2020 CLINICAL DATA:  Questionable sepsis. EXAM: PORTABLE CHEST 1 VIEW COMPARISON:  CT 03/02/2020.  Chest x-ray 02/28/2020. FINDINGS: Mediastinum hilar structures normal. Heart size stable. Low lung volumes with mild bibasilar atelectasis. No pleural effusion or pneumothorax. 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 Abdominal series with decubitus views suggested for confirmation. Degenerative changes lumbar spine IMPRESSION: 1. Low lung volumes with mild bibasilar atelectasis. 2. 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 Abdominal series with decubitus views suggested for confirmation. These results will be called to the ordering clinician or representative by the Radiologist Assistant, and communication documented in the PACS or [employer]. Electronically Signed   By: Levi Fus  Register  On: 06/15/2020 09:24   CT Renal Stone Study  Result Date: 06/15/2020 CLINICAL DATA:  COVID-19 positivity with abdominal pain EXAM: CT ABDOMEN AND PELVIS WITHOUT CONTRAST TECHNIQUE: Multidetector CT imaging of the abdomen and pelvis was performed following the standard protocol without IV contrast. COMPARISON:  05/18/2020 ultrasound, CT from 03/02/20 FINDINGS: Lower chest: Mild dependent atelectatic changes are noted. No focal infiltrate is seen. Hepatobiliary: Gallbladder has been surgically removed. Liver is within  normal limits. Pancreas: Unremarkable. No pancreatic ductal dilatation or surrounding inflammatory changes. Spleen: Normal in size without focal abnormality. Adrenals/Urinary Tract: Adrenal glands are within normal limits. Kidneys are well visualized bilaterally. Multiple nonobstructing renal calculi are noted on the left. The largest of these is seen in the lower pole measuring 14 mm in greatest dimension. A few small stones are noted within the left renal pelvis although not obstructive in nature. Mild fullness of the collecting systems is noted bilaterally although no obstructing calculi are seen. Bladder is well distended with multiple diverticula identified. Stable wall thickening is noted within the bladder which may be related to chronic outlet obstruction. Stomach/Bowel: Mild gaseous distension of the colon is seen without obstructive change. Stable mild wall thickening in the rectum is seen likely related to chronic inflammation. The appendix is not well visualized. No inflammatory changes to suggest appendicitis are noted. Stomach and small bowel appear within normal limits. Vascular/Lymphatic: Aortic atherosclerosis. No enlarged abdominal or pelvic lymph nodes. Reproductive: Prostate is unremarkable. Other: No abdominal wall hernia or abnormality. No abdominopelvic ascites. Musculoskeletal: Degenerative changes of the lumbar spine are noted. IMPRESSION: Nonobstructing renal calculi on the left similar to that seen on the prior exam. Mild fullness of the collecting system is noted likely related to bladder distension as no obstructing calculi are seen. This was not seen on the prior exam due to bladder decompression. Chronic bladder wall thickening with small diverticula identified. Mild gaseous distension of the colon without obstructive change. Some residual chronic wall thickening in the rectum is seen. Mild dependent atelectasis. Electronically Signed   By: Alcide Clever M.D.   On: 06/15/2020 12:05       Discharge Exam: Vitals:   06/24/20 0541 06/24/20 1413  BP: (!) 108/55 110/60  Pulse: 84 80  Resp: 18 18  Temp: 98 F (36.7 C) (!) 97.5 F (36.4 C)  SpO2:  97%   Vitals:   06/24/20 0541 06/24/20 0600 06/24/20 1413 06/25/20 0500  BP: (!) 108/55  110/60   Pulse: 84  80   Resp: 18  18   Temp: 98 F (36.7 C)  (!) 97.5 F (36.4 C)   TempSrc:   Axillary   SpO2:   97%   Weight:  72.5 kg  71.3 kg  Height:        General: Pt is alert, awake, not in acute distress, nonverbal Cardiovascular: RRR, S1/S2 +, no rubs, no gallops Respiratory: CTA bilaterally, no wheezing, no rhonchi Abdominal: Soft, NT, ND, bowel sounds + Extremities: no edema, no cyanosis    The results of significant diagnostics from this hospitalization (including imaging, microbiology, ancillary and laboratory) are listed below for reference.     Microbiology: Recent Results (from the past 240 hour(s))  MRSA PCR Screening     Status: None   Collection Time: 06/15/20  2:03 PM   Specimen: Nasopharyngeal  Result Value Ref Range Status   MRSA by PCR NEGATIVE NEGATIVE Final    Comment:        The GeneXpert MRSA Assay (FDA  approved for NASAL specimens only), is one component of a comprehensive MRSA colonization surveillance program. It is not intended to diagnose MRSA infection nor to guide or monitor treatment for MRSA infections. Performed at [facility], [address] [place], [place] [address]   C Difficile Quick Screen w PCR reflex     Status: Abnormal   Collection Time: 06/16/20  8:58 AM   Specimen: STOOL  Result Value Ref Range Status   C Diff antigen POSITIVE (A) NEGATIVE Final    Comment: CRITICAL RESULT CALLED TO, READ BACK BY AND VERIFIED WITH: LARIMORE A AT 1520 06/16/2020 RC    C Diff toxin POSITIVE (A) NEGATIVE Final    Comment: CRITICAL RESULT CALLED TO, READ BACK BY AND VERIFIED WITH: LARIMORE A AT 1520 06/16/2020 RC    C Diff interpretation Toxin producing C. difficile  detected.  Final    Comment: Performed at [facility], [address] [place], [place] [address]  Gastrointestinal Panel by PCR , Stool     Status: Abnormal   Collection Time: 06/16/20  1:53 PM   Specimen: STOOL  Result Value Ref Range Status   Campylobacter species NOT DETECTED NOT DETECTED Final   Plesimonas shigelloides NOT DETECTED NOT DETECTED Final   Salmonella species NOT DETECTED NOT DETECTED Final   Yersinia enterocolitica NOT DETECTED NOT DETECTED Final   Vibrio species NOT DETECTED NOT DETECTED Final   Vibrio cholerae NOT DETECTED NOT DETECTED Final   Enteroaggregative E coli (EAEC) NOT DETECTED NOT DETECTED Final   Enteropathogenic E coli (EPEC) NOT DETECTED NOT DETECTED Final   Enterotoxigenic E coli (ETEC) NOT DETECTED NOT DETECTED Final   Shiga like toxin producing E coli (STEC) NOT DETECTED NOT DETECTED Final   Shigella/Enteroinvasive E coli (EIEC) NOT DETECTED NOT DETECTED Final   Cryptosporidium NOT DETECTED NOT DETECTED Final   Cyclospora cayetanensis NOT DETECTED NOT DETECTED Final   Entamoeba histolytica NOT DETECTED NOT DETECTED Final   Giardia lamblia NOT DETECTED NOT DETECTED Final   Adenovirus F40/41 DETECTED (A) NOT DETECTED Final   Astrovirus NOT DETECTED NOT DETECTED Final   Norovirus GI/GII NOT DETECTED NOT DETECTED Final   Rotavirus A NOT DETECTED NOT DETECTED Final   Sapovirus (I, II, IV, and V) NOT DETECTED NOT DETECTED Final    Comment: Performed at [facility], [address] [place], [place] [address]     Labs: BNP (last 3 results) Recent Labs    06/15/20 0858  BNP 751.0*   Basic Metabolic Panel: Recent Labs  Lab 06/19/20 0555 06/22/20 0837 06/23/20 0545 06/24/20 0603 06/25/20 0557  NA 141 137 137 139 137  K 3.9 3.8 3.6 4.1 4.0  CL 111 107 105 108 106  CO2 GLUCOSE 153* 164* 159* 165* 151*  BUN 17 5* <5* <5* <5*  CREATININE 0.70 0.50* 0.51* 0.49* 0.55*  CALCIUM 8.0* 8.3* 8.7* 8.0* 8.2*  MG 1.9 1.8 1.8  1.9 1.7   Liver Function Tests: No results for input(s): AST, ALT, ALKPHOS, BILITOT, PROT, ALBUMIN in the last 168 hours. No results for input(s): LIPASE, AMYLASE in the last 168 hours. No results for input(s): AMMONIA in the last 168 hours. CBC: Recent Labs  Lab 06/19/20 0555 06/23/20 0545  WBC 6.5 8.4  HGB 9.5* 11.2*  HCT 30.6* 37.0*  MCV 88.4 87.5  PLT 161 378   Cardiac Enzymes: No results for input(s): CKTOTAL, CKMB, CKMBINDEX, TROPONINI in the last 168 hours. BNP: Invalid input(s): POCBNP CBG:  Recent Labs  Lab 06/24/20 0758 06/24/20 1201 06/24/20 1657 06/24/20 2101 06/25/20 0741  GLUCAP 158* 157* 167* 161* 144*   D-Dimer No results for input(s): DDIMER in the last 72 hours. Hgb A1c No results for input(s): HGBA1C in the last 72 hours. Lipid Profile No results for input(s): CHOL, HDL, LDLCALC, TRIG, CHOLHDL, LDLDIRECT in the last 72 hours. Thyroid function studies No results for input(s): TSH, T4TOTAL, T3FREE, THYROIDAB in the last 72 hours.  Invalid input(s): FREET3 Anemia work up No results for input(s): VITAMINB12, FOLATE, FERRITIN, TIBC, IRON, RETICCTPCT in the last 72 hours. Urinalysis    Component Value Date/Time   COLORURINE YELLOW 06/15/2020 0912   APPEARANCEUR CLOUDY (A) 06/15/2020 0912   APPEARANCEUR Cloudy (A) 03/17/2020 1621   LABSPEC 1.014 06/15/2020 0912   PHURINE 7.0 06/15/2020 0912   GLUCOSEU NEGATIVE 06/15/2020 0912   HGBUR MODERATE (A) 06/15/2020 0912   BILIRUBINUR NEGATIVE 06/15/2020 0912   BILIRUBINUR Negative 03/17/2020 1621   KETONESUR NEGATIVE 06/15/2020 0912   PROTEINUR 100 (A) 06/15/2020 0912   UROBILINOGEN 1.0 07/20/2014 1718   NITRITE NEGATIVE 06/15/2020 0912   LEUKOCYTESUR LARGE (A) 06/15/2020 0912   Sepsis Labs Invalid input(s): PROCALCITONIN,  WBC,  LACTICIDVEN Microbiology Recent Results (from the past 240 hour(s))  MRSA PCR Screening     Status: None   Collection Time: 06/15/20  2:03 PM   Specimen: Nasopharyngeal   Result Value Ref Range Status   MRSA by PCR NEGATIVE NEGATIVE Final    Comment:        The GeneXpert MRSA Assay (FDA approved for NASAL specimens only), is one component of a comprehensive MRSA colonization surveillance program. It is not intended to diagnose MRSA infection nor to guide or monitor treatment for MRSA infections. Performed at [facility], [address] [place], [place] [id number]   C Difficile Quick Screen w PCR reflex     Status: Abnormal   Collection Time: 06/16/20  8:58 AM   Specimen: STOOL  Result Value Ref Range Status   C Diff antigen POSITIVE (A) NEGATIVE Final    Comment: CRITICAL RESULT CALLED TO, READ BACK BY AND VERIFIED WITH: LARIMORE A AT 1520 06/16/2020 RC    C Diff toxin POSITIVE (A) NEGATIVE Final    Comment: CRITICAL RESULT CALLED TO, READ BACK BY AND VERIFIED WITH: LARIMORE A AT 1520 06/16/2020 RC    C Diff interpretation Toxin producing C. difficile detected.  Final    Comment: Performed at [facility], [address] [place], [place] [address]  Gastrointestinal Panel by PCR , Stool     Status: Abnormal   Collection Time: 06/16/20  1:53 PM   Specimen: STOOL  Result Value Ref Range Status   Campylobacter species NOT DETECTED NOT DETECTED Final   Plesimonas shigelloides NOT DETECTED NOT DETECTED Final   Salmonella species NOT DETECTED NOT DETECTED Final   Yersinia enterocolitica NOT DETECTED NOT DETECTED Final   Vibrio species NOT DETECTED NOT DETECTED Final   Vibrio cholerae NOT DETECTED NOT DETECTED Final   Enteroaggregative E coli (EAEC) NOT DETECTED NOT DETECTED Final   Enteropathogenic E coli (EPEC) NOT DETECTED NOT DETECTED Final   Enterotoxigenic E coli (ETEC) NOT DETECTED NOT DETECTED Final   Shiga like toxin producing E coli (STEC) NOT DETECTED NOT DETECTED Final   Shigella/Enteroinvasive E coli (EIEC) NOT DETECTED NOT DETECTED Final   Cryptosporidium NOT DETECTED NOT DETECTED Final   Cyclospora cayetanensis NOT DETECTED NOT  DETECTED Final   Entamoeba histolytica NOT DETECTED NOT  DETECTED Final   Giardia lamblia NOT DETECTED NOT DETECTED Final   Adenovirus F40/41 DETECTED (A) NOT DETECTED Final   Astrovirus NOT DETECTED NOT DETECTED Final   Norovirus GI/GII NOT DETECTED NOT DETECTED Final   Rotavirus A NOT DETECTED NOT DETECTED Final   Sapovirus (I, II, IV, and V) NOT DETECTED NOT DETECTED Final    Comment: Performed at [facility], [address] [place], [place] [address]     Time coordinating discharge: 35 minutes  SIGNED:   Erick Blinks, DO [facility] 06/25/2020, 10:19 AM  If 7PM-7AM, please contact night-coverage [email or web address]

## 2020-06-25 NOTE — TOC Transition Note (Signed)
Transition of Care [facility]) - CM/SW Discharge Note   Patient Details  Name: [name] MRN: [id number] Date of Birth: 1950-09-04  [facility] CM/SW Contact:  Barry Brunner, LCSW Phone Number: 06/25/2020, 11:30 AM   Clinical Narrative:    CSW notified of patient's readiness for discharge. CSW notified Barbara Cower with [facility]. Barbara Cower agreeable to provide services to patient. TOC signing off.   Final next level of care: Home w Home Health Services Barriers to Discharge: Barriers Resolved   Patient Goals and CMS Choice Patient states their goals for this hospitalization and ongoing recovery are:: Return home with [facility] CMS Medicare.gov Compare Post Acute Care list provided to:: Patient Choice offered to / list presented to : Patient  Discharge Placement                    Patient and family notified of of transfer: 06/25/20  Discharge Plan and Services     Post Acute Care Choice: Home Health          DME Arranged: N/A DME Agency: NA       HH Arranged: RN,PT,OT HH Agency: [facility] Date HH Agency Contacted: 06/25/20 Time HH Agency Contacted: 1130 Representative spoke with at [facility] Agency: Barbara Cower  Social Determinants of Health (SDOH) Interventions     Readmission Risk Interventions Readmission Risk Prevention Plan 05/26/2019 09/20/2018 09/19/2018  Transportation Screening Complete Complete Complete  PCP or Specialist Appt within 3-5 Days Not Complete - -  HRI or Home Care Consult Complete - -  Social Work Consult for Recovery Care Planning/Counseling Complete - -  Palliative Care Screening Not Complete - -  Medication Review Oceanographer) Complete Complete Complete  PCP or Specialist appointment within 3-5 days of discharge - Complete Complete  PCP/Specialist Appt Not Complete comments - - SNF resident. Sees facility MD  HRI or Home Care Consult - Not Complete -  HRI or Home Care Consult Pt Refusal Comments - Patient is discharging to  SNF for rehab -  SW Recovery Care/Counseling Consult - Complete -  Palliative Care Screening - Not Applicable -  Skilled Nursing Facility - Complete -  Some recent data might be hidden

## 2020-06-29 ENCOUNTER — Emergency Department (HOSPITAL_COMMUNITY): Payer: Medicare Other

## 2020-06-29 ENCOUNTER — Encounter (HOSPITAL_COMMUNITY): Payer: Self-pay | Admitting: Emergency Medicine

## 2020-06-29 ENCOUNTER — Other Ambulatory Visit: Payer: Self-pay

## 2020-06-29 ENCOUNTER — Emergency Department (HOSPITAL_COMMUNITY)
Admission: EM | Admit: 2020-06-29 | Discharge: 2020-06-29 | Disposition: A | Discharge status: Home / Self Care | Payer: Medicare Other | Attending: Emergency Medicine | Admitting: Emergency Medicine

## 2020-06-29 DIAGNOSIS — M25552 Pain in left hip: Secondary | ICD-10-CM | POA: Diagnosis not present

## 2020-06-29 DIAGNOSIS — E119 Type 2 diabetes mellitus without complications: Secondary | ICD-10-CM | POA: Insufficient documentation

## 2020-06-29 DIAGNOSIS — I10 Essential (primary) hypertension: Secondary | ICD-10-CM | POA: Insufficient documentation

## 2020-06-29 DIAGNOSIS — Z7984 Long term (current) use of oral hypoglycemic drugs: Secondary | ICD-10-CM | POA: Insufficient documentation

## 2020-06-29 DIAGNOSIS — Y9301 Activity, walking, marching and hiking: Secondary | ICD-10-CM | POA: Insufficient documentation

## 2020-06-29 DIAGNOSIS — Z79899 Other long term (current) drug therapy: Secondary | ICD-10-CM | POA: Insufficient documentation

## 2020-06-29 DIAGNOSIS — R531 Weakness: Secondary | ICD-10-CM

## 2020-06-29 DIAGNOSIS — Z7982 Long term (current) use of aspirin: Secondary | ICD-10-CM | POA: Diagnosis not present

## 2020-06-29 DIAGNOSIS — W1839XA Other fall on same level, initial encounter: Secondary | ICD-10-CM | POA: Diagnosis not present

## 2020-06-29 DIAGNOSIS — Y92009 Unspecified place in unspecified non-institutional (private) residence as the place of occurrence of the external cause: Secondary | ICD-10-CM | POA: Diagnosis not present

## 2020-06-29 DIAGNOSIS — Z8616 Personal history of COVID-19: Secondary | ICD-10-CM | POA: Insufficient documentation

## 2020-06-29 DIAGNOSIS — W19XXXA Unspecified fall, initial encounter: Secondary | ICD-10-CM

## 2020-06-29 LAB — CBC WITH DIFFERENTIAL/PLATELET
Abs Immature Granulocytes: 0.03 10*3/uL (ref 0.00–0.07)
Basophils Absolute: 0 10*3/uL (ref 0.0–0.1)
Basophils Relative: 0 %
Eosinophils Absolute: 0.1 10*3/uL (ref 0.0–0.5)
Eosinophils Relative: 1 %
HCT: 30.7 % — ABNORMAL LOW (ref 39.0–52.0)
Hemoglobin: 9.2 g/dL — ABNORMAL LOW (ref 13.0–17.0)
Immature Granulocytes: 0 %
Lymphocytes Relative: 9 %
Lymphs Abs: 0.8 10*3/uL (ref 0.7–4.0)
MCH: 27.1 pg (ref 26.0–34.0)
MCHC: 30 g/dL (ref 30.0–36.0)
MCV: 90.6 fL (ref 80.0–100.0)
Monocytes Absolute: 0.7 10*3/uL (ref 0.1–1.0)
Monocytes Relative: 7 %
Neutro Abs: 7.9 10*3/uL — ABNORMAL HIGH (ref 1.7–7.7)
Neutrophils Relative %: 83 %
Platelets: 362 10*3/uL (ref 150–400)
RBC: 3.39 MIL/uL — ABNORMAL LOW (ref 4.22–5.81)
RDW: 18 % — ABNORMAL HIGH (ref 11.5–15.5)
WBC: 9.5 10*3/uL (ref 4.0–10.5)
nRBC: 0 % (ref 0.0–0.2)

## 2020-06-29 LAB — BASIC METABOLIC PANEL
Anion gap: 8 (ref 5–15)
BUN: 7 mg/dL — ABNORMAL LOW (ref 8–23)
CO2: 28 mmol/L (ref 22–32)
Calcium: 8.6 mg/dL — ABNORMAL LOW (ref 8.9–10.3)
Chloride: 100 mmol/L (ref 98–111)
Creatinine, Ser: 0.55 mg/dL — ABNORMAL LOW (ref 0.61–1.24)
GFR, Estimated: >60 mL/min (ref >60)
Glucose, Bld: 126 mg/dL — ABNORMAL HIGH (ref 70–99)
Potassium: 3.4 mmol/L — ABNORMAL LOW (ref 3.5–5.1)
Sodium: 136 mmol/L (ref 135–145)

## 2020-06-29 MED ORDER — ACETAMINOPHEN 325 MG PO TABS
650.0000 mg | ORAL_TABLET | Freq: Once | ORAL | Status: AC
  Administered 2020-06-29: 650 mg via ORAL
  Filled 2020-06-29: qty 2

## 2020-06-29 MED ORDER — POTASSIUM CHLORIDE CRYS ER 20 MEQ PO TBCR
40.0000 meq | EXTENDED_RELEASE_TABLET | Freq: Once | ORAL | Status: AC
  Administered 2020-06-29: 40 meq via ORAL
  Filled 2020-06-29: qty 2

## 2020-06-29 NOTE — ED Triage Notes (Signed)
Pt fell 5 days ago at home. Family denies hitting head, loc. Pt nonverbal. Grimacing when laid back, shaking head yes for back pain.

## 2020-06-29 NOTE — Discharge Instructions (Addendum)
Call your primary care doctor or specialist as discussed in the next 2-3 days.   Return immediately back to the ER if:  Your symptoms worsen within the next 12-24 hours. You develop new symptoms such as new fevers, persistent vomiting, new pain, shortness of breath, or new weakness or numbness, or if you have any other concerns.  

## 2020-06-29 NOTE — ED Provider Notes (Signed)
[facility] EMERGENCY DEPARTMENT Provider Note   CSN: [id number] Arrival date & time: 06/29/20  0840     History Chief Complaint  Patient presents with   Hip Pain    [name] is a 70 y.o. male.  Patient brought to the ER chief complaint of generalized weakness and a fall 5 days ago.  Family states that he just has not been the same since he returned from his admission about a week ago.  He used to walk with a walker but has been having difficulty doing that for the past week.  About 5 days ago as the patient was walking from the bed to the walker with assistance of his family he slid out.  They are able to hold onto him and so they deny any traumatic fall.  He is continue to be deconditioned having generalized weakness, also complaining of some left hip pain and brought to the ER today for evaluation.  No reports of fever cough.  No vomiting or diarrhea.  Patient is nonverbal at baseline and communicates by shaking his head yes and no.        Past Medical History:  Diagnosis Date   Diabetes mellitus without complication (HCC)    History of gout    History of kidney stones    Hypertension    Mentally challenged    Urinary retention     Patient Active Problem List   Diagnosis Date Noted   SIRS (systemic inflammatory response syndrome) (HCC) 06/15/2020   Goals of care, counseling/discussion    Encounter for hospice care discussion    COVID-19 virus infection 02/28/2020   Palliative care by specialist    Pneumoperitoneum 07/04/2019   Intra-abdominal free air of unknown etiology 07/04/2019   Cystitis    Chronic cholecystitis 05/21/2019   Chronic constipation 03/31/2019   Cholecystostomy care (HCC) 11/04/2018   Acute lower UTI 09/18/2018   Abdominal distention 09/18/2018   Protein-calorie malnutrition, severe (HCC) 09/09/2018   Acute blood loss anemia 09/09/2018   Gangrenous cholecystitis 09/05/2018   Pneumatosis intestinalis    Gall  bladder disease 08/25/2018   Small bowel ischemia (HCC)    Acute gangrenous cholecystitis 05/24/2018   SBO (small bowel obstruction) (HCC) 05/24/2018   Abdominal distension    Pressure injury of skin 04/09/2018   C. difficile diarrhea 04/08/2018   Bilateral hydronephrosis 04/08/2018   Pneumoperitoneum of unknown etiology    Diarrhea 04/07/2018   Nonverbal 04/07/2018   Hydronephrosis with urinary obstruction due to ureteral calculus 06/20/2016   Sepsis secondary to UTI (HCC) 06/20/2016   Fever 06/19/2016   Generalized weakness 06/19/2016   Diabetes mellitus type 2, noninsulin dependent (HCC) 06/19/2016   Severe sepsis with septic shock (HCC) 06/19/2016   UTI (urinary tract infection) 06/19/2016   Hematuria 06/19/2016   AKI (acute kidney injury) (HCC) 06/19/2016   Hyponatremia 06/19/2016   Hypokalemia 06/19/2016   BPH (benign prostatic hyperplasia) 06/19/2016   Urine retention 05/07/2014   Intellectual disability 05/07/2014   Hyperlipidemia 05/07/2014   HTN (hypertension) 05/07/2014   Renal calculus 01/14/2012    Past Surgical History:  Procedure Laterality Date   CHOLECYSTECTOMY N/A 05/25/2019   Procedure: OPEN CHOLECYSTECTOMY;  Surgeon: [name], MD;  Location: [facility];  Service: General;  Laterality: N/A;   CYSTOSCOPY W/ URETERAL STENT PLACEMENT Bilateral 06/20/2016   Procedure: CYSTOSCOPY WITH RETROGRADE PYELOGRAM/URETERAL STENT PLACEMENT;  Surgeon: Malen Gauze, MD;  Location: [facility];  Service: Urology;  Laterality: Bilateral;   CYSTOSCOPY WITH  RETROGRADE PYELOGRAM, URETEROSCOPY AND STENT PLACEMENT Bilateral 08/20/2016   Procedure: CYSTOSCOPY WITH BILATERAL RETROGRADE PYELOGRAM, BILATERAL URETEROSCOPY,  LASER LITHOTRIPSY OF RIGHT URETERAL CALCULI, STONE BASKET EXTRACTION LEFT URETERAL CALCULI  AND BILATERAL URETERAL STENT EXCHANGE;  Surgeon: Malen Gauze, MD;  Location: [facility];  Service: Urology;  Laterality: Bilateral;    CYSTOSCOPY WITH RETROGRADE PYELOGRAM, URETEROSCOPY AND STENT PLACEMENT Left 01/14/2019   Procedure: CYSTOSCOPY WITH LEFT RETROGRADE PYELOGRAM, LEFT URETEROSCOPY AND LEFT URETERAL STENT PLACEMENT;  Surgeon: Malen Gauze, MD;  Location: [facility];  Service: Urology;  Laterality: Left;   CYSTOSCOPY WITH URETHRAL DILATATION  01/14/2019   Procedure: CYSTOSCOPY WITH URETHRAL DILATATION;  Surgeon: Malen Gauze, MD;  Location: [facility];  Service: Urology;;   HOLMIUM LASER APPLICATION Bilateral 08/20/2016   Procedure: HOLMIUM LASER APPLICATION;  Surgeon: Malen Gauze, MD;  Location: [facility];  Service: Urology;  Laterality: Bilateral;   HOLMIUM LASER APPLICATION Left 01/14/2019   Procedure: HOLMIUM LASER APPLICATION;  Surgeon: Malen Gauze, MD;  Location: [facility];  Service: Urology;  Laterality: Left;   IR CATHETER TUBE CHANGE  07/04/2018   IR EXCHANGE BILIARY DRAIN  06/09/2018   IR EXCHANGE BILIARY DRAIN  10/20/2018   IR EXCHANGE BILIARY DRAIN  01/12/2019   IR EXCHANGE BILIARY DRAIN  04/07/2019   IR PERC CHOLECYSTOSTOMY  05/24/2018   LAPAROTOMY N/A 08/25/2018   Procedure: EXPLORATORY LAPAROTOMY reduction of volvulus;  Surgeon: [name], MD;  Location: [facility];  Service: General;  Laterality: N/A;   LAPAROTOMY N/A 05/25/2019   Procedure: EXPLORATORY LAPAROTOMY;  Surgeon: [name], MD;  Location: [facility];  Service: General;  Laterality: N/A;       Family History  Problem Relation Age of Onset   Gallbladder disease Other     Social History   Tobacco Use   Smoking status: Never Smoker   Smokeless tobacco: Never Used  Vaping Use   Vaping Use: Never used  Substance Use Topics   Alcohol use: No   Drug use: No    Home Medications Prior to Admission medications   Medication Sig Start Date End Date Taking? Authorizing Provider  acetaminophen (TYLENOL) 500 MG tablet Take 1,000 mg by mouth every 6 (six) hours as needed for mild pain or headache.     [provider]  acidophilus (RISAQUAD) CAPS capsule Take 1 capsule by mouth daily. 06/26/20 07/26/20  Maurilio Lovely D, DO  aspirin EC 81 MG tablet Take 1 tablet (81 mg total) by mouth daily with breakfast. 05/29/19   Shon Hale, MD  atorvastatin (LIPITOR) 20 MG tablet Take 20 mg by mouth at bedtime. 06/09/19   [provider]  bisacodyl (DULCOLAX) 10 MG suppository Place 1 suppository (10 mg total) rectally daily. 03/03/20   Catarina Hartshorn, MD  FEROSUL 325 (65 Fe) MG tablet Take 325 mg by mouth daily. 04/28/20   [provider]  finasteride (PROSCAR) 5 MG tablet Take 1 tablet (5 mg total) by mouth daily. 05/30/20   McKenzie, Mardene Celeste, MD  linaclotide (LINZESS) 145 MCG CAPS capsule Take 145 mcg by mouth daily before breakfast.    [provider]  magnesium oxide (MAG-OX) 400 MG tablet Take 1 tablet (400 mg total) by mouth daily. 10/23/18   Sharee Holster, NP  metFORMIN (GLUCOPHAGE) 1000 MG tablet Take 1,000 mg by mouth 2 (two) times daily. 04/27/20   [provider]  nitrofurantoin (MACRODANTIN) 50 MG capsule Take 1 capsule (50 mg total) by mouth at bedtime.  05/30/20   Malen Gauze, MD  Potassium Chloride ER 20 MEQ TBCR Take 20 mEq by mouth every Tuesday, Thursday, Saturday, and Sunday. 1 tab daily by mouth 05/30/19   Shon Hale, MD  simethicone (MYLICON) 40 MG/0.6ML drops Take 1.2 mLs (80 mg total) by mouth 4 (four) times daily as needed for flatulence. 04/05/20   Malissa Hippo, MD  tamsulosin (FLOMAX) 0.4 MG CAPS capsule Take 1 capsule (0.4 mg total) by mouth in the morning and at bedtime. 05/30/20   McKenzie, Mardene Celeste, MD    Allergies    Patient has no known allergies.  Review of Systems   Review of Systems  Constitutional: Negative for fever.  HENT: Negative for ear pain and sore throat.   Eyes: Negative for pain.  Respiratory: Negative for cough.   Cardiovascular: Negative for chest pain.  Gastrointestinal: Negative for abdominal  pain.  Genitourinary: Negative for flank pain.  Musculoskeletal: Negative for back pain.  Skin: Negative for color change and rash.  Neurological: Negative for syncope.  All other systems reviewed and are negative.   Physical Exam Updated Vital Signs BP 123/62    Pulse 84    Temp 98.2 F (36.8 C)    Resp 18    Ht 5\' 7"  (1.702 m)    Wt 71.2 kg    SpO2 95%    BMI 24.59 kg/m   Physical Exam Constitutional:      General: He is not in acute distress.    Appearance: He is well-developed.  HENT:     Head: Normocephalic.     Nose: Nose normal.  Eyes:     Extraocular Movements: Extraocular movements intact.  Cardiovascular:     Rate and Rhythm: Normal rate.  Pulmonary:     Effort: Pulmonary effort is normal.  Musculoskeletal:     Comments: Diffuse L4-5 midline tenderness no step-offs noted.  Patient able to range bilateral hips and knees and ankles without pain or discomfort on my exam.  Skin:    Coloration: Skin is not jaundiced.  Neurological:     Mental Status: He is alert. Mental status is at baseline.     ED Results / Procedures / Treatments   Labs (all labs ordered are listed, but only abnormal results are displayed) Labs Reviewed  BASIC METABOLIC PANEL - Abnormal; Notable for the following components:      Result Value   Potassium 3.4 (*)    Glucose, Bld 126 (*)    BUN 7 (*)    Creatinine, Ser 0.55 (*)    Calcium 8.6 (*)    All other components within normal limits  CBC WITH DIFFERENTIAL/PLATELET - Abnormal; Notable for the following components:   RBC 3.39 (*)    Hemoglobin 9.2 (*)    HCT 30.7 (*)    RDW 18.0 (*)    Neutro Abs 7.9 (*)    All other components within normal limits    EKG None  Radiology DG Thoracic Spine 2 View  Result Date: 06/29/2020 CLINICAL DATA:  Pain following fall EXAM: THORACIC SPINE 3 VIEWS COMPARISON:  CT abdomen and pelvis including bony reformats March 01, 2020 FINDINGS: Frontal, lateral, and swimmer's views were obtained.  There is mild midthoracic levoscoliosis. Anterior wedging at T12 is stable compared to prior CT examination. No acute fracture or spondylolisthesis. There is mild disc space narrowing at multiple levels. There are multiple anterior osteophytes. No erosive change or paraspinous lesion. IMPRESSION: Chronic anterior wedging at T12. No acute fracture  or spondylolisthesis. Disc space narrowing at multiple levels consistent with a degree of underlying osteoarthritic change. Midthoracic levoscoliosis. Electronically Signed   By: Bretta BangWilliam  Woodruff III M.D.   On: 06/29/2020 09:58   DG Lumbar Spine Complete  Result Date: 06/29/2020 CLINICAL DATA:  Pain following fall EXAM: LUMBAR SPINE - COMPLETE 4+ VIEW COMPARISON:  CT abdomen and pelvis including bony reformats March 01, 2020 FINDINGS: Frontal, lateral, spot lumbosacral lateral, and bilateral oblique views were obtained. There are 5 non-rib-bearing lumbar type vertebral bodies. There is anterior wedging of the T12 vertebral body which is unchanged compared to prior CT. No acute fracture is evident. No spondylolisthesis. There is moderate disc space narrowing at L2-3, L3-4, L4-5, and L5-S1. There is no appreciable facet arthropathy. IMPRESSION: Anterior wedging at T12, stable compared to November 2021. No acute fracture evident. No spondylolisthesis. Disc space narrowing at multiple levels without appreciable facet arthropathy. Electronically Signed   By: Bretta BangWilliam  Woodruff III M.D.   On: 06/29/2020 09:56   DG Pelvis 1-2 Views  Result Date: 06/29/2020 CLINICAL DATA:  Pain following fall EXAM: PELVIS - 1-2 VIEW COMPARISON:  None. FINDINGS: There is no evidence of pelvic fracture or dislocation. Hip and sacroiliac joint spaces appear normal. Degenerative change noted in the lower lumbar spine. No erosive changes. IMPRESSION: No fracture or dislocation. No appreciable joint space narrowing or erosion. Degenerative change lower lumbar spine. Electronically Signed   By:  Bretta BangWilliam  Woodruff III M.D.   On: 06/29/2020 09:59    Procedures Procedures   Medications Ordered in ED Medications  potassium chloride SA (KLOR-CON) CR tablet 40 mEq (has no administration in time range)  acetaminophen (TYLENOL) tablet 650 mg (650 mg Oral Given 06/29/20 [id number])    ED Course  I have reviewed the triage vital signs and the nursing notes.  Pertinent labs & imaging results that were available during my care of the patient were reviewed by me and considered in my medical decision making (see chart for details).    MDM Rules/Calculators/A&P                          X-ray images show no acute fracture or acute pathology.  Potassium was low, given repletion here in the ER.  Family states that they are looking to place the patient in a rehab facility on an outpatient basis.  Advised return if he has fevers worsening symptoms or any additional concerns.  Otherwise advised him to follow-up with her primary care doctor within the next 1-2 days for assistance in locating appropriate rehab facility for him.   Final Clinical Impression(s) / ED Diagnoses Final diagnoses:  Generalized weakness    Rx / DC Orders ED Discharge Orders    None       Cheryll CockayneHong, Glenn Gullickson S, MD 06/29/20 1104

## 2020-07-07 ENCOUNTER — Encounter (HOSPITAL_COMMUNITY): Payer: Self-pay | Admitting: *Deleted

## 2020-07-07 ENCOUNTER — Other Ambulatory Visit: Payer: Self-pay

## 2020-07-07 ENCOUNTER — Emergency Department (HOSPITAL_COMMUNITY)
Admission: EM | Admit: 2020-07-07 | Discharge: 2020-07-16 | Disposition: A | Discharge status: Home / Self Care | Payer: Medicare Other | Attending: Emergency Medicine | Admitting: Emergency Medicine

## 2020-07-07 ENCOUNTER — Emergency Department (HOSPITAL_COMMUNITY): Payer: Medicare Other

## 2020-07-07 DIAGNOSIS — R52 Pain, unspecified: Secondary | ICD-10-CM

## 2020-07-07 DIAGNOSIS — I10 Essential (primary) hypertension: Secondary | ICD-10-CM | POA: Insufficient documentation

## 2020-07-07 DIAGNOSIS — R2689 Other abnormalities of gait and mobility: Secondary | ICD-10-CM | POA: Insufficient documentation

## 2020-07-07 DIAGNOSIS — R262 Difficulty in walking, not elsewhere classified: Secondary | ICD-10-CM

## 2020-07-07 DIAGNOSIS — Z7982 Long term (current) use of aspirin: Secondary | ICD-10-CM | POA: Insufficient documentation

## 2020-07-07 DIAGNOSIS — R531 Weakness: Secondary | ICD-10-CM | POA: Insufficient documentation

## 2020-07-07 DIAGNOSIS — Z8616 Personal history of COVID-19: Secondary | ICD-10-CM | POA: Diagnosis not present

## 2020-07-07 DIAGNOSIS — Z7984 Long term (current) use of oral hypoglycemic drugs: Secondary | ICD-10-CM | POA: Diagnosis not present

## 2020-07-07 DIAGNOSIS — E119 Type 2 diabetes mellitus without complications: Secondary | ICD-10-CM | POA: Insufficient documentation

## 2020-07-07 DIAGNOSIS — Z79899 Other long term (current) drug therapy: Secondary | ICD-10-CM | POA: Diagnosis not present

## 2020-07-07 DIAGNOSIS — R339 Retention of urine, unspecified: Secondary | ICD-10-CM | POA: Diagnosis not present

## 2020-07-07 DIAGNOSIS — R338 Other retention of urine: Secondary | ICD-10-CM

## 2020-07-07 LAB — CBC WITH DIFFERENTIAL/PLATELET
Abs Immature Granulocytes: 0.02 10*3/uL (ref 0.00–0.07)
Basophils Absolute: 0 10*3/uL (ref 0.0–0.1)
Basophils Relative: 1 %
Eosinophils Absolute: 0.1 10*3/uL (ref 0.0–0.5)
Eosinophils Relative: 1 %
HCT: 33.2 % — ABNORMAL LOW (ref 39.0–52.0)
Hemoglobin: 10.1 g/dL — ABNORMAL LOW (ref 13.0–17.0)
Immature Granulocytes: 0 %
Lymphocytes Relative: 13 %
Lymphs Abs: 0.8 10*3/uL (ref 0.7–4.0)
MCH: 26.9 pg (ref 26.0–34.0)
MCHC: 30.4 g/dL (ref 30.0–36.0)
MCV: 88.3 fL (ref 80.0–100.0)
Monocytes Absolute: 0.6 10*3/uL (ref 0.1–1.0)
Monocytes Relative: 9 %
Neutro Abs: 4.9 10*3/uL (ref 1.7–7.7)
Neutrophils Relative %: 76 %
Platelets: 233 10*3/uL (ref 150–400)
RBC: 3.76 MIL/uL — ABNORMAL LOW (ref 4.22–5.81)
RDW: 17 % — ABNORMAL HIGH (ref 11.5–15.5)
WBC: 6.3 10*3/uL (ref 4.0–10.5)
nRBC: 0 % (ref 0.0–0.2)

## 2020-07-07 LAB — COMPREHENSIVE METABOLIC PANEL
ALT: 9 U/L (ref 0–44)
AST: 10 U/L — ABNORMAL LOW (ref 15–41)
Albumin: 3 g/dL — ABNORMAL LOW (ref 3.5–5.0)
Alkaline Phosphatase: 52 U/L (ref 38–126)
Anion gap: 10 (ref 5–15)
BUN: 11 mg/dL (ref 8–23)
CO2: 27 mmol/L (ref 22–32)
Calcium: 8.8 mg/dL — ABNORMAL LOW (ref 8.9–10.3)
Chloride: 99 mmol/L (ref 98–111)
Creatinine, Ser: 0.63 mg/dL (ref 0.61–1.24)
GFR, Estimated: >60 mL/min (ref >60)
Glucose, Bld: 116 mg/dL — ABNORMAL HIGH (ref 70–99)
Potassium: 3.6 mmol/L (ref 3.5–5.1)
Sodium: 136 mmol/L (ref 135–145)
Total Bilirubin: 0.6 mg/dL (ref 0.3–1.2)
Total Protein: 7.1 g/dL (ref 6.5–8.1)

## 2020-07-07 LAB — URINALYSIS, ROUTINE W REFLEX MICROSCOPIC
Bilirubin Urine: NEGATIVE
Glucose, UA: NEGATIVE mg/dL
Ketones, ur: 5 mg/dL — AB
Nitrite: NEGATIVE
Protein, ur: NEGATIVE mg/dL
Specific Gravity, Urine: 1.009 (ref 1.005–1.030)
WBC, UA: >50 WBC/hpf — ABNORMAL HIGH (ref 0–5)
pH: 7 (ref 5.0–8.0)

## 2020-07-07 LAB — PROTIME-INR
INR: 1 (ref 0.8–1.2)
Prothrombin Time: 13.1 seconds (ref 11.4–15.2)

## 2020-07-07 MED ORDER — MAGNESIUM OXIDE 400 MG PO TABS
400.0000 mg | ORAL_TABLET | Freq: Every day | ORAL | Status: DC
  Administered 2020-07-08 – 2020-07-16 (×9): 400 mg via ORAL
  Filled 2020-07-07 (×22): qty 1

## 2020-07-07 MED ORDER — ASPIRIN EC 81 MG PO TBEC
81.0000 mg | DELAYED_RELEASE_TABLET | Freq: Every day | ORAL | Status: DC
  Administered 2020-07-08 – 2020-07-16 (×9): 81 mg via ORAL
  Filled 2020-07-07 (×9): qty 1

## 2020-07-07 MED ORDER — NITROFURANTOIN MACROCRYSTAL 50 MG PO CAPS
50.0000 mg | ORAL_CAPSULE | Freq: Every day | ORAL | Status: DC
  Administered 2020-07-07 – 2020-07-15 (×9): 50 mg via ORAL
  Filled 2020-07-07 (×11): qty 1

## 2020-07-07 MED ORDER — BISACODYL 10 MG RE SUPP
10.0000 mg | Freq: Every day | RECTAL | Status: DC
  Administered 2020-07-08 – 2020-07-16 (×5): 10 mg via RECTAL
  Filled 2020-07-07 (×7): qty 1

## 2020-07-07 MED ORDER — POTASSIUM CHLORIDE ER 20 MEQ PO TBCR: 20.0000 meq | EXTENDED_RELEASE_TABLET | ORAL | Status: DC

## 2020-07-07 MED ORDER — FINASTERIDE 5 MG PO TABS
5.0000 mg | ORAL_TABLET | Freq: Every day | ORAL | Status: DC
  Administered 2020-07-08 – 2020-07-16 (×9): 5 mg via ORAL
  Filled 2020-07-07 (×9): qty 1

## 2020-07-07 MED ORDER — TAMSULOSIN HCL 0.4 MG PO CAPS: 0.4000 mg | ORAL_CAPSULE | Freq: Once | ORAL | Status: DC

## 2020-07-07 MED ORDER — POTASSIUM CHLORIDE CRYS ER 20 MEQ PO TBCR
20.0000 meq | EXTENDED_RELEASE_TABLET | ORAL | Status: DC
  Administered 2020-07-09: 20 meq via ORAL
  Filled 2020-07-07: qty 1

## 2020-07-07 MED ORDER — LACTATED RINGERS IV SOLN: INTRAVENOUS | Status: DC

## 2020-07-07 MED ORDER — METFORMIN HCL 500 MG PO TABS
1000.0000 mg | ORAL_TABLET | Freq: Two times a day (BID) | ORAL | Status: DC
  Administered 2020-07-08 – 2020-07-16 (×17): 1000 mg via ORAL
  Filled 2020-07-07 (×18): qty 2

## 2020-07-07 MED ORDER — ACETAMINOPHEN 500 MG PO TABS
1000.0000 mg | ORAL_TABLET | Freq: Four times a day (QID) | ORAL | Status: DC | PRN
  Administered 2020-07-08 – 2020-07-13 (×3): 1000 mg via ORAL
  Filled 2020-07-07 (×3): qty 2

## 2020-07-07 MED ORDER — ATORVASTATIN CALCIUM 10 MG PO TABS
20.0000 mg | ORAL_TABLET | Freq: Every day | ORAL | Status: DC
Start: 2020-07-07 — End: 2020-07-16
  Administered 2020-07-07 – 2020-07-15 (×9): 20 mg via ORAL
  Filled 2020-07-07 (×9): qty 2

## 2020-07-07 MED ORDER — METFORMIN HCL 500 MG PO TABS: 1000.0000 mg | ORAL_TABLET | Freq: Two times a day (BID) | ORAL | Status: DC

## 2020-07-07 MED ORDER — LINACLOTIDE 145 MCG PO CAPS
145.0000 ug | ORAL_CAPSULE | Freq: Every day | ORAL | Status: DC
  Administered 2020-07-08 – 2020-07-15 (×8): 145 ug via ORAL
  Filled 2020-07-07 (×8): qty 1

## 2020-07-07 NOTE — ED Notes (Signed)
Pt is currently TOC- a/w SNF placement- will obtain vitals per holding protocol.

## 2020-07-07 NOTE — ED Triage Notes (Addendum)
Pt with generalized weakness and unable to ambulate, nonverbal.  [name] is caretaker and POA. Aunt is trying to pt  place in long term facility.

## 2020-07-07 NOTE — ED Notes (Signed)
Almira Coaster, RN, [facility] made aware of need for meds (macrodantin)

## 2020-07-07 NOTE — TOC Initial Note (Signed)
Transition of Care [facility]) - Initial/Assessment Note    Patient Details  Name: [name] MRN: [id number] Date of Birth: 03-17-1951  [facility] CM/SW Contact:    Iona Beard, [place] Phone Number: 07/07/2020, 8:39 PM  Clinical Narrative:                 [facility] consulted for SNF placement. Per Ms. Long pts aunt/POA pt has not been able to care for himself and is needing more help to compete ADLs. Pts family feels as if they are not able to care for him in the home. Per chart pt has Medicare part B only listed. However, TOC confirmed with [place] that pt does have Medicare part A. CSW informed pts aunt/POA that we were able to confirm this information, which means pt has a payor source for SNF. PT will see pt in the morning to make recommendation. CSW completed Fl2 and started pts PASRR. PASRR will likely be a level II due to pts intellectual disability. CSW confirmed with pts aunt Ms. Long that pt has been nonverbal and had the intellectual disability since he was a young child. CSW educated Ms. Long on process of SNF referral and that we will update her as we have bed offers. TOC to follow with bed offers.   IMPORTANT: It was confirmed that pt does have Part A Medicare by Vaughan Basta with [facility]. Pt also met 3 midnight during hospitalization on 3/2-3/12. Pt is eligible for SNF rehab coverage post prior hospital d/c.   Expected Discharge Plan: Skilled Nursing Facility Barriers to Discharge: SNF Pending bed offer   Patient Goals and CMS Choice Patient states their goals for this hospitalization and ongoing recovery are:: Go to SNF CMS Medicare.gov Compare Post Acute Care list provided to:: Patient Represenative (must comment) Choice offered to / list presented to : [facility] / [facility]  Expected Discharge Plan and Services Expected Discharge Plan: [place] In-house Referral: Clinical Social Work Discharge Planning Services: CM Consult Post Acute Care  Choice: [place] arrangements for the past 2 months: Single Family Home                 DME Arranged: N/A DME Agency: NA       HH Arranged: NA [place] Agency: NA        Prior Living Arrangements/Services Living arrangements for the past 2 months: Single Family Home Lives with:: Relatives Patient language and need for interpreter reviewed:: Yes Do you feel safe going back to the place where you live?: Yes      Need for Family Participation in Patient Care: Yes (Comment) Care giver support system in place?: Yes (comment) Current home services: DME Criminal Activity/Legal Involvement Pertinent to Current Situation/Hospitalization: No - Comment as needed  Activities of Daily Living      Permission Sought/Granted Permission sought to share information with : Family Supports                Emotional Assessment Appearance:: Appears stated age Attitude/Demeanor/Rapport: Unable to Assess Affect (typically observed): Unable to Assess Orientation: : Oriented to Self Alcohol / Substance Use: Not Applicable Psych Involvement: No (comment)  Admission diagnosis:  weakness Patient Active Problem List   Diagnosis Date Noted  . SIRS (systemic inflammatory response syndrome) ([place]) 06/15/2020  . Goals of care, counseling/discussion   . Encounter for hospice care discussion   . COVID-19 virus infection 02/28/2020  . Palliative care by specialist   .  Pneumoperitoneum 07/04/2019  . Intra-abdominal free air of unknown etiology 07/04/2019  . Cystitis   . Chronic cholecystitis 05/21/2019  . Chronic constipation 03/31/2019  . Cholecystostomy care ([place]) 11/04/2018  . Acute lower UTI 09/18/2018  . Abdominal distention 09/18/2018  . Protein-calorie malnutrition, severe ([place]) 09/09/2018  . Acute blood loss anemia 09/09/2018  . Gangrenous cholecystitis 09/05/2018  . Pneumatosis intestinalis   . Gall bladder disease 08/25/2018  . Small bowel ischemia ([place])   . Acute  gangrenous cholecystitis 05/24/2018  . SBO (small bowel obstruction) ([place]) 05/24/2018  . Abdominal distension   . Pressure injury of skin 04/09/2018  . C. difficile diarrhea 04/08/2018  . Bilateral hydronephrosis 04/08/2018  . Pneumoperitoneum of unknown etiology   . Diarrhea 04/07/2018  . Nonverbal 04/07/2018  . Hydronephrosis with urinary obstruction due to ureteral calculus 06/20/2016  . Sepsis secondary to UTI ([place]) 06/20/2016  . Fever 06/19/2016  . Generalized weakness 06/19/2016  . Diabetes mellitus type 2, noninsulin dependent ([place]) 06/19/2016  . Severe sepsis with septic shock ([place]) 06/19/2016  . UTI (urinary tract infection) 06/19/2016  . Hematuria 06/19/2016  . AKI (acute kidney injury) ([place]) 06/19/2016  . Hyponatremia 06/19/2016  . Hypokalemia 06/19/2016  . BPH (benign prostatic hyperplasia) 06/19/2016  . Urine retention 05/07/2014  . Intellectual disability 05/07/2014  . Hyperlipidemia 05/07/2014  . HTN (hypertension) 05/07/2014  . Renal calculus 01/14/2012   PCP:  Jani Gravel, MD Pharmacy:   [facility], [address] [place] [address] Phone: [phone] Fax: [phone]     Social Determinants of Health (SDOH) Interventions    Readmission Risk Interventions Readmission Risk Prevention Plan 05/26/2019 09/20/2018 09/19/2018  Transportation Screening Complete Complete Complete  PCP or Specialist Appt within 3-5 Days Not Complete - -  HRI or Home Care Consult Complete - -  Social Work Consult for [place] Planning/Counseling Complete - -  Palliative Care Screening Not Complete - -  Medication Review Press photographer) Complete Complete Complete  PCP or Specialist appointment within 3-5 days of discharge - Complete Complete  PCP/Specialist Appt Not Complete comments - - SNF resident. Sees facility MD  [place] or Home Care Consult - Not Complete -  [place] or Home Care  Consult Pt Refusal Comments - Patient is discharging to SNF for rehab -  SW Recovery Care/Counseling Consult - Complete -  Palliative Care Screening - Not Applicable -  [place] - Complete -  Some recent data might be hidden

## 2020-07-07 NOTE — [place] FL2 (Cosign Needed)
[place] MEDICAID FL2 LEVEL OF CARE SCREENING TOOL     IDENTIFICATION  Patient Name: [name] Birthdate: 06/01/1950 Sex: male Admission Date (Current Location): 07/07/2020  [facility] and [place] Number:  [employer] and Address:  [facility],  [address] [place] [address]      Provider Number: [id number]  Attending Physician Name and Address:  Derwood Kaplan, MD  Relative Name and Phone Number:  Otelia Sergeant (Aunt)   [phone]    Current Level of Care: SNF Recommended Level of Care: Skilled Nursing Facility Prior Approval Number:    Date Approved/Denied:   PASRR Number:    Discharge Plan: SNF    Current Diagnoses: Patient Active Problem List   Diagnosis Date Noted  . SIRS (systemic inflammatory response syndrome) (HCC) 06/15/2020  . Goals of care, counseling/discussion   . Encounter for hospice care discussion   . COVID-19 virus infection 02/28/2020  . Palliative care by specialist   . Pneumoperitoneum 07/04/2019  . Intra-abdominal free air of unknown etiology 07/04/2019  . Cystitis   . Chronic cholecystitis 05/21/2019  . Chronic constipation 03/31/2019  . Cholecystostomy care (HCC) 11/04/2018  . Acute lower UTI 09/18/2018  . Abdominal distention 09/18/2018  . Protein-calorie malnutrition, severe (HCC) 09/09/2018  . Acute blood loss anemia 09/09/2018  . Gangrenous cholecystitis 09/05/2018  . Pneumatosis intestinalis   . Gall bladder disease 08/25/2018  . Small bowel ischemia (HCC)   . Acute gangrenous cholecystitis 05/24/2018  . SBO (small bowel obstruction) (HCC) 05/24/2018  . Abdominal distension   . Pressure injury of skin 04/09/2018  . C. difficile diarrhea 04/08/2018  . Bilateral hydronephrosis 04/08/2018  . Pneumoperitoneum of unknown etiology   . Diarrhea 04/07/2018  . Nonverbal 04/07/2018  . Hydronephrosis with urinary obstruction due to ureteral calculus 06/20/2016  . Sepsis secondary to UTI (HCC) 06/20/2016   . Fever 06/19/2016  . Generalized weakness 06/19/2016  . Diabetes mellitus type 2, noninsulin dependent (HCC) 06/19/2016  . Severe sepsis with septic shock (HCC) 06/19/2016  . UTI (urinary tract infection) 06/19/2016  . Hematuria 06/19/2016  . AKI (acute kidney injury) (HCC) 06/19/2016  . Hyponatremia 06/19/2016  . Hypokalemia 06/19/2016  . BPH (benign prostatic hyperplasia) 06/19/2016  . Urine retention 05/07/2014  . Intellectual disability 05/07/2014  . Hyperlipidemia 05/07/2014  . HTN (hypertension) 05/07/2014  . Renal calculus 01/14/2012    Orientation RESPIRATION BLADDER Height & Weight     Self  Normal Incontinent,External catheter Weight:   Height:     BEHAVIORAL SYMPTOMS/MOOD NEUROLOGICAL BOWEL NUTRITION STATUS      Incontinent Diet (Puree food only.)  AMBULATORY STATUS COMMUNICATION OF NEEDS Skin   Total Care Non-Verbally Other (Comment) (Has an open laceration on bottom.)                       Personal Care Assistance Level of Assistance  Bathing,Feeding,Dressing,Total care Bathing Assistance: Maximum assistance Feeding assistance: Independent (Can only eat pureed food but can feed self.) Dressing Assistance: Limited assistance Total Care Assistance: Maximum assistance   Functional Limitations Info  Sight,Hearing,Speech Sight Info: Adequate Hearing Info: Adequate Speech Info: Impaired (Mostly non-verbal, can answer some simple yes/no questions.)    SPECIAL CARE FACTORS FREQUENCY  PT (By licensed PT),OT (By licensed OT)     PT Frequency: 5 times weekly OT Frequency: 5 times weekly            Contractures Contractures Info: Not present    Additional Factors Info  Code  Status,Allergies,Insulin Sliding Scale Code Status Info: DNR Allergies Info: None known   Insulin Sliding Scale Info: See d/c summary       Current Medications (07/07/2020):  This is the current hospital active medication list Current Facility-Administered Medications   Medication Dose Route Frequency Provider Last Rate Last Admin  . acetaminophen (TYLENOL) tablet 1,000 mg  1,000 mg Oral Q6H PRN Derwood Kaplan, MD      . Melene Muller ON 07/08/2020] aspirin EC tablet 81 mg  81 mg Oral Q breakfast Nanavati, Ankit, MD      . atorvastatin (LIPITOR) tablet 20 mg  20 mg Oral QHS Nanavati, Ankit, MD      . bisacodyl (DULCOLAX) suppository 10 mg  10 mg Rectal Daily Nanavati, Ankit, MD      . finasteride (PROSCAR) tablet 5 mg  5 mg Oral Daily Nanavati, Ankit, MD      . lactated ringers infusion   Intravenous Continuous Derwood Kaplan, MD 150 mL/hr at 07/07/20 1533 New Bag at 07/07/20 1533  . [START ON 07/08/2020] linaclotide (LINZESS) capsule 145 mcg  145 mcg Oral QAC breakfast Nanavati, Ankit, MD      . magnesium oxide (MAG-OX) tablet 400 mg  400 mg Oral Daily Nanavati, Ankit, MD      . metFORMIN (GLUCOPHAGE) tablet 1,000 mg  1,000 mg Oral BID WC Nanavati, Ankit, MD      . nitrofurantoin (MACRODANTIN) capsule 50 mg  50 mg Oral QHS Nanavati, Ankit, MD      . potassium chloride SA (KLOR-CON) CR tablet 20 mEq  20 mEq Oral Q T,Th,Sat-1800 Nanavati, Ankit, MD      . tamsulosin (FLOMAX) capsule 0.4 mg  0.4 mg Oral Once Derwood Kaplan, MD       Current Outpatient Medications  Medication Sig Dispense Refill  . acetaminophen (TYLENOL) 500 MG tablet Take 1,000 mg by mouth every 6 (six) hours as needed for mild pain or headache.    Marland Kitchen aspirin EC 81 MG tablet Take 1 tablet (81 mg total) by mouth daily with breakfast. 30 tablet 2  . atorvastatin (LIPITOR) 20 MG tablet Take 20 mg by mouth at bedtime.    . bisacodyl (DULCOLAX) 10 MG suppository Place 1 suppository (10 mg total) rectally daily. 30 suppository 1  . finasteride (PROSCAR) 5 MG tablet Take 1 tablet (5 mg total) by mouth daily. 90 tablet 3  . linaclotide (LINZESS) 145 MCG CAPS capsule Take 145 mcg by mouth daily before breakfast.    . magnesium oxide (MAG-OX) 400 MG tablet Take 1 tablet (400 mg total) by mouth daily. 30  tablet 0  . metFORMIN (GLUCOPHAGE) 1000 MG tablet Take 1,000 mg by mouth 2 (two) times daily.    . nitrofurantoin (MACRODANTIN) 50 MG capsule Take 1 capsule (50 mg total) by mouth at bedtime. 30 capsule 11  . Potassium Chloride ER 20 MEQ TBCR Take 20 mEq by mouth every Tuesday, Thursday, Saturday, and Sunday. 1 tab daily by mouth 20 tablet 0  . tamsulosin (FLOMAX) 0.4 MG CAPS capsule Take 1 capsule (0.4 mg total) by mouth in the morning and at bedtime. 180 capsule 3  . acidophilus (RISAQUAD) CAPS capsule Take 1 capsule by mouth daily. (Patient not taking: No sig reported) 30 capsule 0  . FEROSUL 325 (65 Fe) MG tablet Take 325 mg by mouth daily. (Patient not taking: No sig reported)    . simethicone (MYLICON) 40 MG/0.6ML drops Take 1.2 mLs (80 mg total) by mouth 4 (four) times daily as needed  for flatulence. (Patient not taking: No sig reported) 75 mL 1  . vancomycin (VANCOCIN) 125 MG capsule Take 125 mg by mouth every 6 (six) hours. (Patient not taking: No sig reported)       Discharge Medications: Please see discharge summary for a list of discharge medications.  Relevant Imaging Results:  Relevant Lab Results:   Additional Information SS# [id number], [place]

## 2020-07-07 NOTE — ED Provider Notes (Addendum)
[facility] EMERGENCY DEPARTMENT Provider Note   CSN: [id number] Arrival date & time: 07/07/20  1438     History Chief Complaint  Patient presents with  . Weakness    [name] is a 70 y.o. male.  HPI    70 year old male with history of diabetes, cognitive delay comes in a chief complaint of weakness.  Patient resides with his aunt who has medical POA.  Patient was brought into the ER because of weakness.  I spoke with patient's aunt, who states that patient was admitted to the hospital recently and has never been able to ambulate since he was discharged.  He was able to walk prior to admission using a walker.  He has not walked at all since he was discharged.  They had a hard time getting him even into the house because of his weakness.  Home health has been coming at home and they advised that patient come to the ER because he needs admission to a facility.  She denies any conversation about placement at SNF during the last admission.   For the last several days she has been helping the patient turn and even help him with ADLs.  She is herself elderly and unable to keep up.  She does not feel comfortable taking him home as he has had couple of falls at home already.  Past Medical History:  Diagnosis Date  . Diabetes mellitus without complication (HCC)   . History of gout   . History of kidney stones   . Hypertension   . Mentally challenged   . Urinary retention     Patient Active Problem List   Diagnosis Date Noted  . SIRS (systemic inflammatory response syndrome) (HCC) 06/15/2020  . Goals of care, counseling/discussion   . Encounter for hospice care discussion   . COVID-19 virus infection 02/28/2020  . Palliative care by specialist   . Pneumoperitoneum 07/04/2019  . Intra-abdominal free air of unknown etiology 07/04/2019  . Cystitis   . Chronic cholecystitis 05/21/2019  . Chronic constipation 03/31/2019  . Cholecystostomy care (HCC) 11/04/2018  . Acute lower  UTI 09/18/2018  . Abdominal distention 09/18/2018  . Protein-calorie malnutrition, severe (HCC) 09/09/2018  . Acute blood loss anemia 09/09/2018  . Gangrenous cholecystitis 09/05/2018  . Pneumatosis intestinalis   . Gall bladder disease 08/25/2018  . Small bowel ischemia (HCC)   . Acute gangrenous cholecystitis 05/24/2018  . SBO (small bowel obstruction) (HCC) 05/24/2018  . Abdominal distension   . Pressure injury of skin 04/09/2018  . C. difficile diarrhea 04/08/2018  . Bilateral hydronephrosis 04/08/2018  . Pneumoperitoneum of unknown etiology   . Diarrhea 04/07/2018  . Nonverbal 04/07/2018  . Hydronephrosis with urinary obstruction due to ureteral calculus 06/20/2016  . Sepsis secondary to UTI (HCC) 06/20/2016  . Fever 06/19/2016  . Generalized weakness 06/19/2016  . Diabetes mellitus type 2, noninsulin dependent (HCC) 06/19/2016  . Severe sepsis with septic shock (HCC) 06/19/2016  . UTI (urinary tract infection) 06/19/2016  . Hematuria 06/19/2016  . AKI (acute kidney injury) (HCC) 06/19/2016  . Hyponatremia 06/19/2016  . Hypokalemia 06/19/2016  . BPH (benign prostatic hyperplasia) 06/19/2016  . Urine retention 05/07/2014  . Intellectual disability 05/07/2014  . Hyperlipidemia 05/07/2014  . HTN (hypertension) 05/07/2014  . Renal calculus 01/14/2012    Past Surgical History:  Procedure Laterality Date  . CHOLECYSTECTOMY N/A 05/25/2019   Procedure: OPEN CHOLECYSTECTOMY;  Surgeon: [name], MD;  Location: [facility];  Service: General;  Laterality: N/A;  .  CYSTOSCOPY W/ URETERAL STENT PLACEMENT Bilateral 06/20/2016   Procedure: CYSTOSCOPY WITH RETROGRADE PYELOGRAM/URETERAL STENT PLACEMENT;  Surgeon: Malen Gauze, MD;  Location: [facility];  Service: Urology;  Laterality: Bilateral;  . CYSTOSCOPY WITH RETROGRADE PYELOGRAM, URETEROSCOPY AND STENT PLACEMENT Bilateral 08/20/2016   Procedure: CYSTOSCOPY WITH BILATERAL RETROGRADE PYELOGRAM, BILATERAL URETEROSCOPY,  LASER  LITHOTRIPSY OF RIGHT URETERAL CALCULI, STONE BASKET EXTRACTION LEFT URETERAL CALCULI  AND BILATERAL URETERAL STENT EXCHANGE;  Surgeon: Malen Gauze, MD;  Location: [facility];  Service: Urology;  Laterality: Bilateral;  . CYSTOSCOPY WITH RETROGRADE PYELOGRAM, URETEROSCOPY AND STENT PLACEMENT Left 01/14/2019   Procedure: CYSTOSCOPY WITH LEFT RETROGRADE PYELOGRAM, LEFT URETEROSCOPY AND LEFT URETERAL STENT PLACEMENT;  Surgeon: Malen Gauze, MD;  Location: [facility];  Service: Urology;  Laterality: Left;  . CYSTOSCOPY WITH URETHRAL DILATATION  01/14/2019   Procedure: CYSTOSCOPY WITH URETHRAL DILATATION;  Surgeon: Malen Gauze, MD;  Location: [facility];  Service: Urology;;  . HOLMIUM LASER APPLICATION Bilateral 08/20/2016   Procedure: HOLMIUM LASER APPLICATION;  Surgeon: Malen Gauze, MD;  Location: [facility];  Service: Urology;  Laterality: Bilateral;  . HOLMIUM LASER APPLICATION Left 01/14/2019   Procedure: HOLMIUM LASER APPLICATION;  Surgeon: Malen Gauze, MD;  Location: [facility];  Service: Urology;  Laterality: Left;  . IR CATHETER TUBE CHANGE  07/04/2018  . IR EXCHANGE BILIARY DRAIN  06/09/2018  . IR EXCHANGE BILIARY DRAIN  10/20/2018  . IR EXCHANGE BILIARY DRAIN  01/12/2019  . IR EXCHANGE BILIARY DRAIN  04/07/2019  . IR PERC CHOLECYSTOSTOMY  05/24/2018  . LAPAROTOMY N/A 08/25/2018   Procedure: EXPLORATORY LAPAROTOMY reduction of volvulus;  Surgeon: [name], MD;  Location: [facility];  Service: General;  Laterality: N/A;  . LAPAROTOMY N/A 05/25/2019   Procedure: EXPLORATORY LAPAROTOMY;  Surgeon: [name], MD;  Location: [facility];  Service: General;  Laterality: N/A;       Family History  Problem Relation Age of Onset  . Gallbladder disease Other     Social History   Tobacco Use  . Smoking status: Never Smoker  . Smokeless tobacco: Never Used  Vaping Use  . Vaping Use: Never used  Substance Use Topics  . Alcohol use: No  . Drug use: No    Home  Medications Prior to Admission medications   Medication Sig Start Date End Date Taking? Authorizing Provider  acetaminophen (TYLENOL) 500 MG tablet Take 1,000 mg by mouth every 6 (six) hours as needed for mild pain or headache.   Yes [provider]  aspirin EC 81 MG tablet Take 1 tablet (81 mg total) by mouth daily with breakfast. 05/29/19  Yes Emokpae, Courage, MD  atorvastatin (LIPITOR) 20 MG tablet Take 20 mg by mouth at bedtime. 06/09/19  Yes [provider]  bisacodyl (DULCOLAX) 10 MG suppository Place 1 suppository (10 mg total) rectally daily. 03/03/20  Yes Tat, Onalee Hua, MD  finasteride (PROSCAR) 5 MG tablet Take 1 tablet (5 mg total) by mouth daily. 05/30/20  Yes McKenzie, Mardene Celeste, MD  linaclotide (LINZESS) 145 MCG CAPS capsule Take 145 mcg by mouth daily before breakfast.   Yes [provider]  magnesium oxide (MAG-OX) 400 MG tablet Take 1 tablet (400 mg total) by mouth daily. 10/23/18  Yes Sharee Holster, NP  metFORMIN (GLUCOPHAGE) 1000 MG tablet Take 1,000 mg by mouth 2 (two) times daily. 04/27/20  Yes [provider]  nitrofurantoin (MACRODANTIN) 50 MG capsule Take 1 capsule (50 mg total) by mouth at bedtime. 05/30/20  Yes Malen Gauze, MD  Potassium Chloride ER 20 MEQ TBCR Take 20 mEq by mouth every Tuesday, Thursday, Saturday, and Sunday. 1 tab daily by mouth 05/30/19  Yes Emokpae, Courage, MD  tamsulosin (FLOMAX) 0.4 MG CAPS capsule Take 1 capsule (0.4 mg total) by mouth in the morning and at bedtime. 05/30/20  Yes McKenzie, Mardene Celeste, MD  acidophilus (RISAQUAD) CAPS capsule Take 1 capsule by mouth daily. Patient not taking: No sig reported 06/26/20 07/26/20  Sherryll Burger, Pratik D, DO  FEROSUL 325 (65 Fe) MG tablet Take 325 mg by mouth daily. Patient not taking: No sig reported 04/28/20   [provider]  simethicone (MYLICON) 40 MG/0.6ML drops Take 1.2 mLs (80 mg total) by mouth 4 (four) times daily as needed for flatulence. Patient not taking:  No sig reported 04/05/20   Malissa Hippo, MD  vancomycin (VANCOCIN) 125 MG capsule Take 125 mg by mouth every 6 (six) hours. Patient not taking: No sig reported 06/25/20   [provider]    Allergies    Patient has no known allergies.  Review of Systems   Review of Systems  Unable to perform ROS: Patient nonverbal    Physical Exam Updated Vital Signs BP 118/66   Pulse 92   Temp (!) 97.5 F (36.4 C) (Oral)   Resp 17   SpO2 96%   Physical Exam Vitals and nursing note reviewed.  Constitutional:      Appearance: He is well-developed.  HENT:     Head: Atraumatic.  Cardiovascular:     Rate and Rhythm: Normal rate.  Pulmonary:     Effort: Pulmonary effort is normal.  Musculoskeletal:     Cervical back: Neck supple.     Comments: No palpable tenderness over the lower spine, patient moving all 4 extremities. Lower extremity strength does appear to be 4 out of 5 bilaterally.  Patient not cooperating and keeping the legs up for 5 seconds.  Skin:    General: Skin is warm.  Neurological:     General: No focal deficit present.     Mental Status: He is alert.     ED Results / Procedures / Treatments   Labs (all labs ordered are listed, but only abnormal results are displayed) Labs Reviewed  CBC WITH DIFFERENTIAL/PLATELET - Abnormal; Notable for the following components:      Result Value   RBC 3.76 (*)    Hemoglobin 10.1 (*)    HCT 33.2 (*)    RDW 17.0 (*)    All other components within normal limits  COMPREHENSIVE METABOLIC PANEL - Abnormal; Notable for the following components:   Glucose, Bld 116 (*)    Calcium 8.8 (*)    Albumin 3.0 (*)    AST 10 (*)    All other components within normal limits  URINALYSIS, ROUTINE W REFLEX MICROSCOPIC - Abnormal; Notable for the following components:   APPearance CLOUDY (*)    Hgb urine dipstick SMALL (*)    Ketones, ur 5 (*)    Leukocytes,Ua LARGE (*)    WBC, UA >50 (*)    Bacteria, UA MANY (*)    All other  components within normal limits  URINE CULTURE  PROTIME-INR  CBC WITH DIFFERENTIAL/PLATELET    EKG EKG Interpretation  Date/Time:  Thursday July 07 2020 15:32:40 EDT Ventricular Rate:  76 PR Interval:    QRS Duration: 104 QT Interval:  416 QTC Calculation: 468 R Axis:   20 Text Interpretation: Sinus rhythm RSR' in  V1 or V2, right VCD or RVH No acute changes No significant change since last tracing Confirmed by Derwood Kaplan [phone]) on 07/07/2020 4:10:19 PM   Radiology DG Chest Port 1 View  Result Date: 07/07/2020 CLINICAL DATA:  Generalized weakness, inability to ambulate EXAM: PORTABLE CHEST 1 VIEW COMPARISON:  06/15/2020 FINDINGS: Single frontal view of the chest demonstrates a stable cardiac silhouette. No airspace disease, effusion, or pneumothorax. Colonic interposition upper abdomen again noted, with marked gaseous distention of the colon unchanged. No acute bony abnormalities. IMPRESSION: 1. Stable chest, no acute process. Electronically Signed   By: Sharlet Salina M.D.   On: 07/07/2020 16:09    Procedures Procedures   Medications Ordered in ED Medications  acetaminophen (TYLENOL) tablet 1,000 mg (has no administration in time range)  aspirin EC tablet 81 mg (has no administration in time range)  atorvastatin (LIPITOR) tablet 20 mg (20 mg Oral Given 07/07/20 2300)  bisacodyl (DULCOLAX) suppository 10 mg (0 mg Rectal Hold 07/07/20 1821)  finasteride (PROSCAR) tablet 5 mg (0 mg Oral Hold 07/07/20 1821)  linaclotide (LINZESS) capsule 145 mcg (has no administration in time range)  magnesium oxide (MAG-OX) tablet 400 mg (0 mg Oral Hold 07/07/20 1822)  nitrofurantoin (MACRODANTIN) capsule 50 mg (50 mg Oral Given 07/07/20 2301)  tamsulosin (FLOMAX) capsule 0.4 mg (0 mg Oral Hold 07/07/20 1822)  metFORMIN (GLUCOPHAGE) tablet 1,000 mg (0 mg Oral Hold 07/07/20 1822)  potassium chloride SA (KLOR-CON) CR tablet 20 mEq (0 mEq Oral Hold 07/07/20 1823)    ED Course  I have reviewed the  triage vital signs and the nursing notes.  Pertinent labs & imaging results that were available during my care of the patient were reviewed by me and considered in my medical decision making (see chart for details).  Clinical Course as of 07/08/20 0008  Fri Jul 08, 2020  0005 Urinalysis, Routine w reflex microscopic Urine, Catheterized(!) In and out cath ordered for UA. It is showing WBC is over 50 and many bacteria.  Leukocyte also present.  Patient has history of recurrent UTI and he is on Macrobid.  On 3-12 he had completed 10 days of ceftriaxone and 2 rounds of fosfomycin.  Clinically, hard to call this UTI.  There is no elevated white count and patient had no specific complaints from his side.  He is having urinary retention, but he has history of recurrent urinary retention.  We will get cultures in case patient deteriorates, antibiotics can be initiated.  For now we will treat this as asymptomatic bacteriuria. [AN]  0006 Given that patient has history of requiring self cath, although he had urinary retention on bladder scan and required cath here, we will not put in a Foley catheter which, given his history of recurrent UTI and given his history of cognitive delay -might only complicate the situation further.  We will put in for urology follow-up request to evaluate for urinary retention [AN]    Clinical Course User Index [AN] Derwood Kaplan, MD   MDM Rules/Calculators/A&P                          70 year old comes in w/ chief complaint of weakness. Patient was recently admitted to the hospital for UTI.  He also had COVID-19 earlier.  Apparently patient was able to ambulate prior to his admission on 3-2.  He was discharged from the hospital on 3-12 and he was nonambulatory.  Home health has been going to his house.  Patient hardly participates in any PT sessions.  He has had couple of falls because of reliance on ADL over the family.  Family is now feeling  overwhelmed.  Unfortunately, there is no medical indication for patient to be admitted, labs ordered and they are reassuring.   I did discuss with the family that we can start placement through our social work today, but the transition can occur from their house.  Patient's aunt was vehemently against the idea as patient has had couple of falls at home.  She is unsure why the patient was discharged in first place he was unable to ambulate.  I will discussed the case with our TOC.  I think the odds makes a reasonable argument from her end.  12:08 AM Labs are reassuring.  UA still pending.  He requires in and out cath occasionally, nursing staff will perform in and out cath if needed at 9 PM.   Social work informed me that they will start looking for placement tomorrow.  Patient is eligible for placement.  PT consult has been placed. Final Clinical Impression(s) / ED Diagnoses Final diagnoses:  Generalized weakness  Impaired ambulation  Acute urinary retention    Rx / DC Orders ED Discharge Orders    None       Derwood KaplanNanavati, Samyiah Halvorsen, MD 07/07/20 1944    Derwood KaplanNanavati, Jordyn Hofacker, MD 07/08/20 0008

## 2020-07-07 NOTE — ED Notes (Signed)
Per aunt/caretaker, patient has taken his medications already this morning  MD aware

## 2020-07-08 ENCOUNTER — Emergency Department (HOSPITAL_COMMUNITY): Payer: Medicare Other

## 2020-07-08 DIAGNOSIS — R531 Weakness: Secondary | ICD-10-CM | POA: Diagnosis not present

## 2020-07-08 MED ORDER — CEPHALEXIN 500 MG PO CAPS: 500.0000 mg | ORAL_CAPSULE | Freq: Once | ORAL | Status: DC

## 2020-07-08 MED ORDER — LEVOFLOXACIN 750 MG PO TABS
750.0000 mg | ORAL_TABLET | Freq: Every day | ORAL | Status: AC
  Administered 2020-07-08 – 2020-07-14 (×7): 750 mg via ORAL
  Filled 2020-07-08 (×7): qty 1

## 2020-07-08 NOTE — ED Provider Notes (Addendum)
I have spoken to the family member They are aware of results This is the POA - Ms Long, Pt has abnormal UA - has had lots of abx over the last couple of weeks,  Pending cultures, Levaquin started - has had self caths and manipulation of tract - still c/o back pain.  Unfortunately the patient was recently admitted after having ESBL E. coli, Klebsiella infection, sepsis and C. difficile infection all at the same time, spent 10 days in the hospital during which time he received Rocephin for 10 days IV as well as vancomycin for 5 days and 2 days of oral vancomycin when he left.  He has been too weak to help with his care and continues to be needing assistance at home, working on placement at this time  Despite having abnormal urinalysis it is not clear that this is actually related to a specific bacteria, cultures pending, he has received 1 dose of Levaquin here in the emergency department  Pt FL2 has been signed pending placement.  Pt will need to finish course of Levaquin at d/c for 7 days   Eber Hong, MD 07/08/20 1910    Eber Hong, MD 07/08/20 [phone]

## 2020-07-08 NOTE — ED Notes (Signed)
Bladder Scanned pt to find over of urine.  Pt straight cathed with out via cath.

## 2020-07-08 NOTE — TOC Progression Note (Signed)
Per Eunice Blase with [facility] pt has been accepted. CSW spoke with pts aunt/POA who is agreeable to accepting bed offer. CSW awaiting pts PASRR. Once that has been obtained pt can transfer to [place]. Pt is COVID vaccinated and boosted. TOC to follow.

## 2020-07-08 NOTE — ED Notes (Signed)
Pt feed by nursing staff.  Pt ate 90% of meal.

## 2020-07-08 NOTE — Evaluation (Signed)
Physical Therapy Evaluation Patient Details Name: [name] MRN: [id number] DOB: 1950-12-08 Today's Date: 07/08/2020   History of Present Illness  70 year old male with history of diabetes, cognitive delay comes in a chief complaint of weakness.  Patient resides with his aunt who has medical POA.     Patient was brought into the ER because of weakness.  I spoke with patient's aunt, who states that patient was admitted to the hospital recently and has never been able to ambulate since he was discharged.  He was able to walk prior to admission using a walker.  He has not walked at all since he was discharged.  They had a hard time getting him even into the house because of his weakness.  Home health has been coming at home and they advised that patient come to the ER because he needs admission to a facility.  She denies any conversation about placement at SNF during the last admission.      For the last several days she has been helping the patient turn and even help him with ADLs.  She is herself elderly and unable to keep up.  She does not feel comfortable taking him home as he has had couple of falls at home already.    Clinical Impression  Patient demonstrates slow labored movement for sitting up at bedside with c/o severe left hip pain during movement.  Patient unable to attempt sit to stands due to left hip pain and fall risk and required Mod/max assist to put back to bed and reposition.  Patient may benefit from X-ray to r/o fracture left hip - RN notified.  Patient will benefit from continued physical therapy in hospital and recommended venue below to increase strength, balance, endurance for safe ADLs and gait.     Follow Up Recommendations SNF    Equipment Recommendations  None recommended by PT    Recommendations for Other Services       Precautions / Restrictions Precautions Precautions: Fall Restrictions Weight Bearing Restrictions: No      Mobility  Bed Mobility Overal  bed mobility: Needs Assistance Bed Mobility: Supine to Sit;Sit to Supine     Supine to sit: Mod assist;Max assist Sit to supine: Mod assist;Max assist   General bed mobility comments: slow labored movement with c/o sever pain left hip    Transfers                    Ambulation/Gait                Stairs            Wheelchair Mobility    Modified Rankin (Stroke Patients Only)       Balance Overall balance assessment: Needs assistance Sitting-balance support: Feet supported;Bilateral upper extremity supported Sitting balance-[name]: Fair Sitting balance - Comments: seated at EOB                                     Pertinent Vitals/Pain Pain Assessment: Faces Faces Pain Scale: Hurts whole lot Pain Location: left hip Pain Descriptors / Indicators: Sore;Grimacing;Discomfort Pain Intervention(s): Limited activity within patient's tolerance;Monitored during session;Repositioned    Home Living Family/patient expects to be discharged to:: Private residence Living Arrangements: Other relatives Available Help at Discharge: Family;Available 24 hours/day Type of Home: Mobile home Home Access: Stairs to enter Entrance Stairs-Rails: Right;Left;Can reach both Entrance Stairs-Number of Steps: 3-4  Home Layout: One level Home Equipment: Walker - 2 wheels;Wheelchair - manual Additional Comments: History taken from prior admission due to expressive difficulties.    Prior Function Level of Independence: Needs assistance;Independent with assistive device(s)   Gait / Transfers Assistance Needed: household ambulator with RW  ADL's / Homemaking Assistance Needed: assisted by family        Hand Dominance        Extremity/Trunk Assessment   Upper Extremity Assessment Upper Extremity Assessment: Generalized weakness    Lower Extremity Assessment Lower Extremity Assessment: Generalized weakness;LLE deficits/detail LLE Deficits /  Details: grossly -3/5 LLE: Unable to fully assess due to pain LLE Sensation: WNL LLE Coordination: WNL    Cervical / Trunk Assessment Cervical / Trunk Assessment: Kyphotic  Communication   Communication: Expressive difficulties  Cognition Arousal/Alertness: Awake/alert Behavior During Therapy: WFL for tasks assessed/performed Overall Cognitive Status: Within Functional Limits for tasks assessed                                        General Comments      Exercises     Assessment/Plan    PT Assessment    PT Problem List         PT Treatment Interventions DME instruction;Gait training;Stair training;Functional mobility training;Therapeutic activities;Therapeutic exercise;Balance training;Patient/family education    PT Goals (Current goals can be found in the Care Plan section)  Acute Rehab PT Goals Patient Stated Goal: not stated (non verbal) PT Goal Formulation: With patient Time For Goal Achievement: 07/22/20 Potential to Achieve Goals: Good    Frequency Min 2X/week   Barriers to discharge        Co-evaluation               AM-PAC PT "6 Clicks" Mobility  Outcome Measure Help needed turning from your back to your side while in a flat bed without using bedrails?: A Lot Help needed moving from lying on your back to sitting on the side of a flat bed without using bedrails?: A Lot Help needed moving to and from a bed to a chair (including a wheelchair)?: A Lot Help needed standing up from a chair using your arms (e.g., wheelchair or bedside chair)?: Total Help needed to walk in hospital room?: Total Help needed climbing 3-5 steps with a railing? : Total 6 Click Score: 9    End of Session   Activity Tolerance: Patient limited by fatigue;Patient limited by pain Patient left: in bed;with call bell/phone within reach Nurse Communication: Mobility status PT Visit Diagnosis: Unsteadiness on feet (R26.81);Other abnormalities of gait and mobility  (R26.89);Muscle weakness (generalized) (M62.81)    Time: 8099-8338 PT Time Calculation (min) (ACUTE ONLY): 24 min   Charges:   PT Evaluation $PT Eval Moderate Complexity: 1 Mod PT Treatments $Therapeutic Activity: 23-37 mins        8:58 AM, 07/08/20 Ocie Bob, MPT Physical Therapist with [facility] [phone] office [phone] mobile phone

## 2020-07-08 NOTE — NC FL2 (Signed)
[place] MEDICAID FL2 LEVEL OF CARE SCREENING TOOL     IDENTIFICATION  Patient Name: [name] Birthdate: 05/31/1950 Sex: male Admission Date (Current Location): 07/07/2020  [facility] and [place] Number:  [employer] and Address:  [facility],  [address] [place] [address]      Provider Number: [id number]  Attending Physician Name and Address:  Default, Provider, MD  Relative Name and Phone Number:  Otelia Sergeant (Aunt)   [phone]    Current Level of Care: Hospital Recommended Level of Care: Skilled Nursing Facility Prior Approval Number:    Date Approved/Denied:   PASRR Number:    Discharge Plan: SNF    Current Diagnoses: Patient Active Problem List   Diagnosis Date Noted  . SIRS (systemic inflammatory response syndrome) (HCC) 06/15/2020  . Goals of care, counseling/discussion   . Encounter for hospice care discussion   . COVID-19 virus infection 02/28/2020  . Palliative care by specialist   . Pneumoperitoneum 07/04/2019  . Intra-abdominal free air of unknown etiology 07/04/2019  . Cystitis   . Chronic cholecystitis 05/21/2019  . Chronic constipation 03/31/2019  . Cholecystostomy care (HCC) 11/04/2018  . Acute lower UTI 09/18/2018  . Abdominal distention 09/18/2018  . Protein-calorie malnutrition, severe (HCC) 09/09/2018  . Acute blood loss anemia 09/09/2018  . Gangrenous cholecystitis 09/05/2018  . Pneumatosis intestinalis   . Gall bladder disease 08/25/2018  . Small bowel ischemia (HCC)   . Acute gangrenous cholecystitis 05/24/2018  . SBO (small bowel obstruction) (HCC) 05/24/2018  . Abdominal distension   . Pressure injury of skin 04/09/2018  . C. difficile diarrhea 04/08/2018  . Bilateral hydronephrosis 04/08/2018  . Pneumoperitoneum of unknown etiology   . Diarrhea 04/07/2018  . Nonverbal 04/07/2018  . Hydronephrosis with urinary obstruction due to ureteral calculus 06/20/2016  . Sepsis secondary to UTI (HCC)  06/20/2016  . Fever 06/19/2016  . Generalized weakness 06/19/2016  . Diabetes mellitus type 2, noninsulin dependent (HCC) 06/19/2016  . Severe sepsis with septic shock (HCC) 06/19/2016  . UTI (urinary tract infection) 06/19/2016  . Hematuria 06/19/2016  . AKI (acute kidney injury) (HCC) 06/19/2016  . Hyponatremia 06/19/2016  . Hypokalemia 06/19/2016  . BPH (benign prostatic hyperplasia) 06/19/2016  . Urine retention 05/07/2014  . Intellectual disability 05/07/2014  . Hyperlipidemia 05/07/2014  . HTN (hypertension) 05/07/2014  . Renal calculus 01/14/2012    Orientation RESPIRATION BLADDER Height & Weight     Self  Normal Incontinent,External catheter Weight:   Height:     BEHAVIORAL SYMPTOMS/MOOD NEUROLOGICAL BOWEL NUTRITION STATUS      Incontinent Diet (Puree food only.)  AMBULATORY STATUS COMMUNICATION OF NEEDS Skin   Total Care Non-Verbally Other (Comment) (Has an open laceration on bottom.)                       Personal Care Assistance Level of Assistance  Bathing,Feeding,Dressing,Total care Bathing Assistance: Maximum assistance Feeding assistance: Independent (Can only eat pureed food but can feed self.) Dressing Assistance: Limited assistance Total Care Assistance: Maximum assistance   Functional Limitations Info  Sight,Hearing,Speech Sight Info: Adequate Hearing Info: Adequate Speech Info: Impaired (Mostly non-verbal, can answer some simple yes/no questions.)    SPECIAL CARE FACTORS FREQUENCY  PT (By licensed PT),OT (By licensed OT)     PT Frequency: 5 times weekly OT Frequency: 5 times weekly            Contractures Contractures Info: Not present    Additional Factors Info  Code  Status,Allergies,Insulin Sliding Scale Code Status Info: DNR Allergies Info: None known   Insulin Sliding Scale Info: see d/c summary       Current Medications (07/08/2020):  This is the current hospital active medication list Current Facility-Administered  Medications  Medication Dose Route Frequency Provider Last Rate Last Admin  . acetaminophen (TYLENOL) tablet 1,000 mg  1,000 mg Oral Q6H PRN Rhunette Croft, Ankit, MD      . aspirin EC tablet 81 mg  81 mg Oral Q breakfast Rhunette Croft, Ankit, MD   81 mg at 07/08/20 0732  . atorvastatin (LIPITOR) tablet 20 mg  20 mg Oral QHS Nanavati, Ankit, MD   20 mg at 07/07/20 2300  . bisacodyl (DULCOLAX) suppository 10 mg  10 mg Rectal Daily Derwood Kaplan, MD   10 mg at 07/08/20 0954  . finasteride (PROSCAR) tablet 5 mg  5 mg Oral Daily Derwood Kaplan, MD   5 mg at 07/08/20 0955  . linaclotide (LINZESS) capsule 145 mcg  145 mcg Oral QAC breakfast Derwood Kaplan, MD   145 mcg at 07/08/20 0732  . magnesium oxide (MAG-OX) tablet 400 mg  400 mg Oral Daily Derwood Kaplan, MD   400 mg at 07/08/20 0955  . metFORMIN (GLUCOPHAGE) tablet 1,000 mg  1,000 mg Oral BID WC Nanavati, Ankit, MD   1,000 mg at 07/08/20 0732  . nitrofurantoin (MACRODANTIN) capsule 50 mg  50 mg Oral QHS Nanavati, Ankit, MD   50 mg at 07/07/20 2301  . potassium chloride SA (KLOR-CON) CR tablet 20 mEq  20 mEq Oral Q T,Th,Sat-1800 Nanavati, Ankit, MD      . tamsulosin (FLOMAX) capsule 0.4 mg  0.4 mg Oral Once Derwood Kaplan, MD       Current Outpatient Medications  Medication Sig Dispense Refill  . acetaminophen (TYLENOL) 500 MG tablet Take 1,000 mg by mouth every 6 (six) hours as needed for mild pain or headache.    Marland Kitchen aspirin EC 81 MG tablet Take 1 tablet (81 mg total) by mouth daily with breakfast. 30 tablet 2  . atorvastatin (LIPITOR) 20 MG tablet Take 20 mg by mouth at bedtime.    . bisacodyl (DULCOLAX) 10 MG suppository Place 1 suppository (10 mg total) rectally daily. 30 suppository 1  . finasteride (PROSCAR) 5 MG tablet Take 1 tablet (5 mg total) by mouth daily. 90 tablet 3  . linaclotide (LINZESS) 145 MCG CAPS capsule Take 145 mcg by mouth daily before breakfast.    . magnesium oxide (MAG-OX) 400 MG tablet Take 1 tablet (400 mg total) by mouth  daily. 30 tablet 0  . metFORMIN (GLUCOPHAGE) 1000 MG tablet Take 1,000 mg by mouth 2 (two) times daily.    . nitrofurantoin (MACRODANTIN) 50 MG capsule Take 1 capsule (50 mg total) by mouth at bedtime. 30 capsule 11  . Potassium Chloride ER 20 MEQ TBCR Take 20 mEq by mouth every Tuesday, Thursday, Saturday, and Sunday. 1 tab daily by mouth 20 tablet 0  . tamsulosin (FLOMAX) 0.4 MG CAPS capsule Take 1 capsule (0.4 mg total) by mouth in the morning and at bedtime. 180 capsule 3  . acidophilus (RISAQUAD) CAPS capsule Take 1 capsule by mouth daily. (Patient not taking: No sig reported) 30 capsule 0  . FEROSUL 325 (65 Fe) MG tablet Take 325 mg by mouth daily. (Patient not taking: No sig reported)    . simethicone (MYLICON) 40 MG/0.6ML drops Take 1.2 mLs (80 mg total) by mouth 4 (four) times daily as needed for flatulence. (Patient not  taking: No sig reported) 75 mL 1  . vancomycin (VANCOCIN) 125 MG capsule Take 125 mg by mouth every 6 (six) hours. (Patient not taking: No sig reported)       Discharge Medications: Please see discharge summary for a list of discharge medications.  Relevant Imaging Results:  Relevant Lab Results:   Additional Information SS# [id number] [place]

## 2020-07-08 NOTE — ED Notes (Signed)
Pt eating

## 2020-07-08 NOTE — Plan of Care (Signed)
  Problem: Acute Rehab PT Goals(only PT should resolve) Goal: Pt Will Go Supine/Side To Sit Outcome: Progressing Flowsheets (Taken 07/08/2020 0900) Pt will go Supine/Side to Sit: with minimal assist Goal: Patient Will Transfer Sit To/From Stand Outcome: Progressing Flowsheets (Taken 07/08/2020 0900) Patient will transfer sit to/from stand:  with minimal assist  with moderate assist Goal: Pt Will Transfer Bed To Chair/Chair To Bed Outcome: Progressing Flowsheets (Taken 07/08/2020 0900) Pt will Transfer Bed to Chair/Chair to Bed:  with min assist  with mod assist Goal: Pt Will Ambulate Outcome: Progressing Flowsheets (Taken 07/08/2020 0900) Pt will Ambulate:  15 feet  with minimal assist  with moderate assist  with rolling walker   9:01 AM, 07/08/20 Ocie Bob, MPT Physical Therapist with [facility] [phone] office [phone] mobile phone

## 2020-07-08 NOTE — ED Notes (Signed)
Pts aunt called for information on the pt.  Family member demanding a MRI and yelling at this nurse, "what the hell are yall doing over there?  This man needs and MRI he cant move!"   This nurse explained to the family member that we had done appropriate testing and if she had questions she could come in and see the pt and speak with the provider.  Family member continued to yell at this nurse.  Witnessed by other staff members at the nurses station.

## 2020-07-09 ENCOUNTER — Encounter (HOSPITAL_COMMUNITY): Payer: Self-pay | Admitting: Emergency Medicine

## 2020-07-09 DIAGNOSIS — R531 Weakness: Secondary | ICD-10-CM | POA: Diagnosis not present

## 2020-07-09 NOTE — ED Notes (Addendum)
ED Staff inquired about progress of PASRR Form. ED Staff informed this has not been completed yet at this time and [facility] will not accept Pt to transfer there until the Form is completed. AP AC has made ED Staff aware this Pt will be in ED Boarding until at least Monday.

## 2020-07-09 NOTE — ED Provider Notes (Signed)
Patient alert stable and awaiting nursing home placement.  Patient taking Levaquin for UTI   Bethann Berkshire, MD 07/09/20 1528

## 2020-07-09 NOTE — ED Notes (Signed)
Family at bedside. 

## 2020-07-10 DIAGNOSIS — R531 Weakness: Secondary | ICD-10-CM | POA: Diagnosis not present

## 2020-07-10 NOTE — ED Notes (Signed)
Pt appears to be resting comfortably. Pt repositioned in bed, call light within reach. Pt denies pain or any further need at this time.

## 2020-07-10 NOTE — ED Notes (Signed)
Family member by bedside assisting with feeding.

## 2020-07-11 DIAGNOSIS — R531 Weakness: Secondary | ICD-10-CM | POA: Diagnosis not present

## 2020-07-11 LAB — CBG MONITORING, ED
Glucose-Capillary: 140 mg/dL — ABNORMAL HIGH (ref 70–99)
Glucose-Capillary: 121 mg/dL — ABNORMAL HIGH (ref 70–99)
Glucose-Capillary: 138 mg/dL — ABNORMAL HIGH (ref 70–99)

## 2020-07-11 LAB — CARBAPENEM RESISTANCE PANEL
Carba Resistance IMP Gene: NOT DETECTED
Carba Resistance KPC Gene: NOT DETECTED
Carba Resistance NDM Gene: NOT DETECTED
Carba Resistance OXA48 Gene: NOT DETECTED
Carba Resistance VIM Gene: NOT DETECTED

## 2020-07-11 LAB — URINE CULTURE: Culture: >=100000 — AB

## 2020-07-11 MED ORDER — SODIUM CHLORIDE 0.9 % IV BOLUS
500.0000 mL | Freq: Once | INTRAVENOUS | Status: AC
  Administered 2020-07-11: 500 mL via INTRAVENOUS

## 2020-07-11 NOTE — Progress Notes (Signed)
Physical Therapy Treatment Patient Details Name: [name] MRN: [id number] DOB: 06/05/1950 Today's Date: 07/11/2020    History of Present Illness 70 year old male with history of diabetes, cognitive delay comes in a chief complaint of weakness.  Patient resides with his aunt who has medical POA.     Patient was brought into the ER because of weakness.  I spoke with patient's aunt, who states that patient was admitted to the hospital recently and has never been able to ambulate since he was discharged.  He was able to walk prior to admission using a walker.  He has not walked at all since he was discharged.  They had a hard time getting him even into the house because of his weakness.  Home health has been coming at home and they advised that patient come to the ER because he needs admission to a facility.  She denies any conversation about placement at SNF during the last admission.      For the last several days she has been helping the patient turn and even help him with ADLs.  She is herself elderly and unable to keep up.  She does not feel comfortable taking him home as he has had couple of falls at home already.    PT Comments    Patient demonstrates slow labored movement for sitting up at bedside with c/o increased right hip pain, unable to maintain standing balance using RW due to poor tolerance for weightbearing on RLE, required Max assist stand pivot with left blocked to transfer to chair.  Patient tolerated sitting up in chair after therapy - nursing staff notified.  Patient will benefit from continued physical therapy in hospital and recommended venue below to increase strength, balance, endurance for safe ADLs and gait.     Follow Up Recommendations  SNF     Equipment Recommendations  None recommended by PT    Recommendations for Other Services       Precautions / Restrictions Precautions Precautions: Fall Restrictions Weight Bearing Restrictions: No    Mobility  Bed  Mobility Overal bed mobility: Needs Assistance Bed Mobility: Supine to Sit     Supine to sit: Mod assist;Max assist     General bed mobility comments: slow labored movement with c/o increased pain left hip    Transfers Overall transfer level: Needs assistance Equipment used: Rolling walker (2 wheeled) Transfers: Sit to/from [employer] Sit to Stand: Max assist Stand pivot transfers: Max assist          Ambulation/Gait                 Stairs             Wheelchair Mobility    Modified Rankin (Stroke Patients Only)       Balance Overall balance assessment: Needs assistance Sitting-balance support: Feet supported;Bilateral upper extremity supported Sitting balance-[name]: Fair Sitting balance - Comments: seated at EOB   Standing balance support: During functional activity;Bilateral upper extremity supported Standing balance-[name]: Poor Standing balance comment: using RW                            Cognition Arousal/Alertness: Awake/alert Behavior During Therapy: WFL for tasks assessed/performed Overall Cognitive Status: Within Functional Limits for tasks assessed  Exercises General Exercises - Lower Extremity Long Arc Quad: AROM;Strengthening;Both;10 reps;Seated Hip Flexion/Marching: AROM;Strengthening;Both;5 reps;Seated    General Comments        Pertinent Vitals/Pain Pain Assessment: Faces Faces Pain Scale: Hurts even more Pain Location: left hip Pain Descriptors / Indicators: Sore;Grimacing;Discomfort Pain Intervention(s): Limited activity within patient's tolerance;Monitored during session;Repositioned    Home Living                      Prior Function            PT Goals (current goals can now be found in the care plan section) Acute Rehab PT Goals PT Goal Formulation: With patient Time For Goal Achievement: 07/22/20 Potential  to Achieve Goals: Good    Frequency    Min 2X/week      PT Plan Current plan remains appropriate    Co-evaluation              AM-PAC PT "6 Clicks" Mobility   Outcome Measure  Help needed turning from your back to your side while in a flat bed without using bedrails?: A Lot Help needed moving from lying on your back to sitting on the side of a flat bed without using bedrails?: A Lot Help needed moving to and from a bed to a chair (including a wheelchair)?: A Lot Help needed standing up from a chair using your arms (e.g., wheelchair or bedside chair)?: A Lot Help needed to walk in hospital room?: Total Help needed climbing 3-5 steps with a railing? : Total 6 Click Score: 10    End of Session   Activity Tolerance: Patient limited by fatigue;Patient limited by pain Patient left: in chair;with call bell/phone within reach Nurse Communication: Mobility status PT Visit Diagnosis: Unsteadiness on feet (R26.81);Other abnormalities of gait and mobility (R26.89);Muscle weakness (generalized) (M62.81)     Time: 2440-1027 PT Time Calculation (min) (ACUTE ONLY): 26 min  Charges:  $Therapeutic Activity: 23-37 mins                     12:31 PM, 07/11/20 Ocie Bob, MPT Physical Therapist with [facility] 336 [phone] office [phone] mobile phone

## 2020-07-11 NOTE — ED Notes (Signed)
Pt ate approximately 50% of his breakfast tray.

## 2020-07-11 NOTE — ED Provider Notes (Addendum)
Patient is awaiting nursing home placement. Currently on Levaquin for UTI.  He did spike a low-grade fever, given Tylenol.  Urine culture shows Klebsiella, susceptibilities are pending.. He appears comfortable and cooperative.    Levi Logan, DO 07/11/20 0749    Levi Logan, DO 07/11/20 [phone]

## 2020-07-11 NOTE — Progress Notes (Signed)
TOC CSW spoke with [facility].  Pt has been accepted and can come on 07/12/2020.  CSW will continue to follow for dc needs.  Camika Marsico Tarpley-Carter, MSW, LCSW-A Pronouns:  She, Her, Hers                  [facility] ED Transitions of CareClinical Social Worker [email or web address] [phone]

## 2020-07-11 NOTE — Progress Notes (Signed)
CSW spoke with Eunice Blase at [facility] who states they can take pt at 10 AM tomorrow 3/29 for admission. Pt has PASRR, it is [id number] E. CSW to complete new Fl2 with PASRR and send in the HUB. If pt has any class II medications they will need hard copies. TOC to follow.

## 2020-07-11 NOTE — NC FL2 (Signed)
[place] MEDICAID FL2 LEVEL OF CARE SCREENING TOOL     IDENTIFICATION  Patient Name: [name] Birthdate: [birth year] Sex: male Admission Date (Current Location): 07/07/2020  [facility] and [place] Number:  [employer] and Address:  [facility],  [address] [place] [address]      Provider Number: [id number]  Attending Physician Name and Address:  Default, Provider, MD  Relative Name and Phone Number:  Otelia Sergeant (Aunt)   [phone]    Current Level of Care: Hospital Recommended Level of Care: Skilled Nursing Facility Prior Approval Number:    Date Approved/Denied:   PASRR Number: [id number]  Discharge Plan: SNF    Current Diagnoses: Patient Active Problem List   Diagnosis Date Noted  . SIRS (systemic inflammatory response syndrome) (HCC) 06/15/2020  . Goals of care, counseling/discussion   . Encounter for hospice care discussion   . COVID-19 virus infection 02/28/2020  . Palliative care by specialist   . Pneumoperitoneum 07/04/2019  . Intra-abdominal free air of unknown etiology 07/04/2019  . Cystitis   . Chronic cholecystitis 05/21/2019  . Chronic constipation 03/31/2019  . Cholecystostomy care (HCC) 11/04/2018  . Acute lower UTI 09/18/2018  . Abdominal distention 09/18/2018  . Protein-calorie malnutrition, severe (HCC) 09/09/2018  . Acute blood loss anemia 09/09/2018  . Gangrenous cholecystitis 09/05/2018  . Pneumatosis intestinalis   . Gall bladder disease 08/25/2018  . Small bowel ischemia (HCC)   . Acute gangrenous cholecystitis 05/24/2018  . SBO (small bowel obstruction) (HCC) 05/24/2018  . Abdominal distension   . Pressure injury of skin 04/09/2018  . C. difficile diarrhea 04/08/2018  . Bilateral hydronephrosis 04/08/2018  . Pneumoperitoneum of unknown etiology   . Diarrhea 04/07/2018  . Nonverbal 04/07/2018  . Hydronephrosis with urinary obstruction due to ureteral calculus 06/20/2016  . Sepsis secondary to UTI  (HCC) 06/20/2016  . Fever 06/19/2016  . Generalized weakness 06/19/2016  . Diabetes mellitus type 2, noninsulin dependent (HCC) 06/19/2016  . Severe sepsis with septic shock (HCC) 06/19/2016  . UTI (urinary tract infection) 06/19/2016  . Hematuria 06/19/2016  . AKI (acute kidney injury) (HCC) 06/19/2016  . Hyponatremia 06/19/2016  . Hypokalemia 06/19/2016  . BPH (benign prostatic hyperplasia) 06/19/2016  . Urine retention 05/07/2014  . Intellectual disability 05/07/2014  . Hyperlipidemia 05/07/2014  . HTN (hypertension) 05/07/2014  . Renal calculus 01/14/2012    Orientation RESPIRATION BLADDER Height & Weight     Self  Normal Incontinent,External catheter Weight:   Height:     BEHAVIORAL SYMPTOMS/MOOD NEUROLOGICAL BOWEL NUTRITION STATUS      Incontinent Diet  AMBULATORY STATUS COMMUNICATION OF NEEDS Skin   Total Care Non-Verbally Other (Comment) (Has an open laceration on bottom)                       Personal Care Assistance Level of Assistance  Bathing,Feeding,Dressing,Total care Bathing Assistance: Maximum assistance Feeding assistance: Independent (Can only eat pureed food but can feed self.) Dressing Assistance: Limited assistance Total Care Assistance: Maximum assistance   Functional Limitations Info  Sight,Hearing,Speech Sight Info: Adequate Hearing Info: Adequate Speech Info: Impaired (Mostly non-verbal, can answer some simple yes/no questions)    SPECIAL CARE FACTORS FREQUENCY  PT (By licensed PT),OT (By licensed OT)     PT Frequency: 5 times weekly OT Frequency: 5 times weekly            Contractures Contractures Info: Not present    Additional Factors Info  Code Status,Allergies,Insulin Sliding Scale Code  Status Info: DNR Allergies Info: None known   Insulin Sliding Scale Info: see d/c summary       Current Medications (07/11/2020):  This is the current hospital active medication list Current Facility-Administered Medications   Medication Dose Route Frequency Provider Last Rate Last Admin  . acetaminophen (TYLENOL) tablet 1,000 mg  1,000 mg Oral Q6H PRN Derwood Kaplan, MD   1,000 mg at 07/11/20 0936  . aspirin EC tablet 81 mg  81 mg Oral Q breakfast Derwood Kaplan, MD   81 mg at 07/11/20 0937  . atorvastatin (LIPITOR) tablet 20 mg  20 mg Oral QHS Derwood Kaplan, MD   20 mg at 07/10/20 2136  . bisacodyl (DULCOLAX) suppository 10 mg  10 mg Rectal Daily Derwood Kaplan, MD   10 mg at 07/08/20 0954  . finasteride (PROSCAR) tablet 5 mg  5 mg Oral Daily Derwood Kaplan, MD   5 mg at 07/11/20 0936  . levofloxacin (LEVAQUIN) tablet 750 mg  750 mg Oral Daily Eber Hong, MD   750 mg at 07/11/20 6578  . linaclotide (LINZESS) capsule 145 mcg  145 mcg Oral QAC breakfast Derwood Kaplan, MD   145 mcg at 07/11/20 0937  . magnesium oxide (MAG-OX) tablet 400 mg  400 mg Oral Daily Derwood Kaplan, MD   400 mg at 07/11/20 0938  . metFORMIN (GLUCOPHAGE) tablet 1,000 mg  1,000 mg Oral BID WC Nanavati, Ankit, MD   1,000 mg at 07/11/20 0937  . nitrofurantoin (MACRODANTIN) capsule 50 mg  50 mg Oral QHS Nanavati, Ankit, MD   50 mg at 07/10/20 2136  . potassium chloride SA (KLOR-CON) CR tablet 20 mEq  20 mEq Oral Q T,Th,Sat-1800 Nanavati, Ankit, MD   20 mEq at 07/09/20 1711  . tamsulosin (FLOMAX) capsule 0.4 mg  0.4 mg Oral Once Derwood Kaplan, MD       Current Outpatient Medications  Medication Sig Dispense Refill  . acetaminophen (TYLENOL) 500 MG tablet Take 1,000 mg by mouth every 6 (six) hours as needed for mild pain or headache.    Marland Kitchen aspirin EC 81 MG tablet Take 1 tablet (81 mg total) by mouth daily with breakfast. 30 tablet 2  . atorvastatin (LIPITOR) 20 MG tablet Take 20 mg by mouth at bedtime.    . bisacodyl (DULCOLAX) 10 MG suppository Place 1 suppository (10 mg total) rectally daily. 30 suppository 1  . finasteride (PROSCAR) 5 MG tablet Take 1 tablet (5 mg total) by mouth daily. 90 tablet 3  . linaclotide (LINZESS) 145 MCG  CAPS capsule Take 145 mcg by mouth daily before breakfast.    . magnesium oxide (MAG-OX) 400 MG tablet Take 1 tablet (400 mg total) by mouth daily. 30 tablet 0  . metFORMIN (GLUCOPHAGE) 1000 MG tablet Take 1,000 mg by mouth 2 (two) times daily.    . nitrofurantoin (MACRODANTIN) 50 MG capsule Take 1 capsule (50 mg total) by mouth at bedtime. 30 capsule 11  . Potassium Chloride ER 20 MEQ TBCR Take 20 mEq by mouth every Tuesday, Thursday, Saturday, and Sunday. 1 tab daily by mouth 20 tablet 0  . tamsulosin (FLOMAX) 0.4 MG CAPS capsule Take 1 capsule (0.4 mg total) by mouth in the morning and at bedtime. 180 capsule 3  . acidophilus (RISAQUAD) CAPS capsule Take 1 capsule by mouth daily. (Patient not taking: No sig reported) 30 capsule 0  . FEROSUL 325 (65 Fe) MG tablet Take 325 mg by mouth daily. (Patient not taking: No sig reported)    .  simethicone (MYLICON) 40 MG/0.6ML drops Take 1.2 mLs (80 mg total) by mouth 4 (four) times daily as needed for flatulence. (Patient not taking: No sig reported) 75 mL 1  . vancomycin (VANCOCIN) 125 MG capsule Take 125 mg by mouth every 6 (six) hours. (Patient not taking: No sig reported)       Discharge Medications: Please see discharge summary for a list of discharge medications.  Relevant Imaging Results:  Relevant Lab Results:   Additional Information SS# [id number] [place]

## 2020-07-11 NOTE — Progress Notes (Signed)
TOC CSW attempted to contact [facility] [phone] and [phone].  CSW left HIPPA compliant message with my contact information.  Sayyid Harewood Tarpley-Carter, MSW, LCSW-A Pronouns:  She, Her, Hers                  [facility] Transitions of CareClinical Social Worker [email or web address] [phone]

## 2020-07-12 ENCOUNTER — Telehealth: Payer: Self-pay

## 2020-07-12 DIAGNOSIS — R531 Weakness: Secondary | ICD-10-CM | POA: Diagnosis not present

## 2020-07-12 LAB — CBG MONITORING, ED: Glucose-Capillary: 135 mg/dL — ABNORMAL HIGH (ref 70–99)

## 2020-07-12 MED ORDER — POTASSIUM CHLORIDE 20 MEQ PO PACK
20.0000 meq | PACK | ORAL | Status: DC
  Administered 2020-07-12 – 2020-07-14 (×2): 20 meq via ORAL
  Filled 2020-07-12 (×2): qty 1

## 2020-07-12 MED ORDER — POTASSIUM CHLORIDE 20 MEQ PO PACK: 20.0000 meq | PACK | ORAL | Status: DC

## 2020-07-12 NOTE — ED Notes (Signed)
Nurse attempted to give report to [facility]. Marta Lamas in admissions stated there was a issue with Pt's Medicare Part A and family will have to fix the issue first. Eunice Blase stated she informed our social worker already.

## 2020-07-12 NOTE — Progress Notes (Signed)
Appropriate Use Committee Chart Review  Chart reviewed by the physician advisor with input from [facility] and the attending MD as needed for review of the appropriateness for SNF referral.  TOC notes, PT/OT/ST notes, nursing notes and physician notes reviewed for medical necessity to determine if the patient's needs are appropriate for short-term rehab to return to a prior level of function versus the likely need for custodial care.  At this time, the patient meets Medicare criteria for SNF placement.  Chart briefly reviewed.  Per TOC note 3/24: Patient's aunt is his POA.  Patient has been nonverbal and has intellectual disability since he was a young child.  Per PT note, patient recently hospitalized (06/15/2020-06/25/2020 for sepsis due to ESBL E. coli and Klebsiella pneumonia UTI, E. coli bacteremia, C. difficile infection and incidental COVID) and discharged home.  Since hospital discharge, has not been able to ambulate and had a hard time getting him even into the house because of his weakness.  He even sustained falls at home.  Prior to that admission, he was able to walk using a walker.  Currently presented to ED with weakness, treating with Levaquin for UTI and PT recommends SNF which is appropriate so he can return to his prior level of functioning prior to returning home to family.   Recommendations: The patient is SNF appropriate for short-term rehab/skilled nursing interventions.  A consult to the Transitions of Care Team has been made to facilitate placement.    Marcellus Scott, MD [facility] Hospitalist/Physician Advisor  07/12/2020 10:19 AM

## 2020-07-12 NOTE — ED Notes (Signed)
Pt able to feed self

## 2020-07-12 NOTE — Telephone Encounter (Signed)
[name] calling to see if Dr. Ronne Binning could order an MRI for patient. Patient cannot walk due to pain in back.  Wants to rule out kidney issues.  Please call back at [phone].  Thanks, Rosey Bath

## 2020-07-12 NOTE — ED Notes (Signed)
Pt attempts to spit out meds, redirection x 2 helpful.

## 2020-07-12 NOTE — Progress Notes (Signed)
TOC CSW spoke with [facility].  Eunice Blase is still working to get pt to [employer]. Eunice Blase is filing Medicaid instead of Medicare, due to pt not having Part A benefits.  Brendaliz Kuk Tarpley-Carter, MSW, LCSW-A Pronouns:  She, Her, Hers                  [facility] Transitions of CareClinical Social Worker [email or web address] [phone]

## 2020-07-12 NOTE — ED Notes (Signed)
Meal tray placed at bedside. Pt shook his head no when asked if he wanted his breakfast.

## 2020-07-12 NOTE — ED Notes (Signed)
Pt refused breakfast x 2

## 2020-07-12 NOTE — Progress Notes (Signed)
TOC CSW received a call from [name].  [name] has some concerns in regards to pts insurance.  It was confirmed that pt has Medicare Part A, but [name] is researching those findings due to Part B only one present at this time. [name] is currently researching pts insurance will update CSW later today.  [name], MSW, LCSW-A Pronouns:  She, Her, Hers                  [facility] Transitions of CareClinical Social Worker [email or web address] [phone]

## 2020-07-12 NOTE — Progress Notes (Signed)
Physical Therapy Treatment Patient Details Name: [name] MRN: [id number] DOB: 06-19-1950 Today's Date: 07/12/2020    History of Present Illness 70 year old male with history of diabetes, cognitive delay comes in a chief complaint of weakness.  Patient resides with his aunt who has medical POA.     Patient was brought into the ER because of weakness.  I spoke with patient's aunt, who states that patient was admitted to the hospital recently and has never been able to ambulate since he was discharged.  He was able to walk prior to admission using a walker.  He has not walked at all since he was discharged.  They had a hard time getting him even into the house because of his weakness.  Home health has been coming at home and they advised that patient come to the ER because he needs admission to a facility.  She denies any conversation about placement at SNF during the last admission.      For the last several days she has been helping the patient turn and even help him with ADLs.  She is herself elderly and unable to keep up.  She does not feel comfortable taking him home as he has had couple of falls at home already.    PT Comments    Patient requires Mod assist for rolling in bed, continues to c/o severe pain RLE/right hip with movement, demonstrates slightly improved tolerance for weightbearing through legs during sit to stands, tends to support body weight with BUE mostly, unable to take steps due to fall risk and put back to bed after therapy.  Patient will benefit from continued physical therapy in hospital and recommended venue below to increase strength, balance, endurance for safe ADLs and gait.    Follow Up Recommendations  SNF     Equipment Recommendations  None recommended by PT    Recommendations for Other Services       Precautions / Restrictions Precautions Precautions: Fall Restrictions Weight Bearing Restrictions: No    Mobility  Bed Mobility Overal bed mobility:  Needs Assistance Bed Mobility: Supine to Sit;Sit to Supine     Supine to sit: Mod assist;Max assist Sit to supine: Mod assist;Max assist   General bed mobility comments: slow labored movement with c/o increased pain left hip    Transfers Overall transfer level: Needs assistance Equipment used: Rolling walker (2 wheeled) Transfers: Sit to/from Stand Sit to Stand: Max assist         General transfer comment: demonstrates slight improvement for weighbearing on feet, but limited due to increased right hip pain  Ambulation/Gait                 Stairs             Wheelchair Mobility    Modified Rankin (Stroke Patients Only)       Balance Overall balance assessment: Needs assistance Sitting-balance support: Feet supported;Bilateral upper extremity supported Sitting balance-[name]: Poor Sitting balance - Comments: fair/poor seated at EOB   Standing balance support: During functional activity;Bilateral upper extremity supported Standing balance-[name]: Poor Standing balance comment: using RW                            Cognition Arousal/Alertness: Awake/alert Behavior During Therapy: WFL for tasks assessed/performed Overall Cognitive Status: Within Functional Limits for tasks assessed  Exercises General Exercises - Lower Extremity Long Arc Quad: AAROM;Strengthening;Both;10 reps;Seated Hip Flexion/Marching: AAROM;Strengthening;Both;10 reps;Seated    General Comments        Pertinent Vitals/Pain Pain Assessment: Faces Faces Pain Scale: Hurts whole lot Pain Location: right hip with movement Pain Descriptors / Indicators: Sore;Grimacing;Discomfort Pain Intervention(s): Limited activity within patient's tolerance;Repositioned    Home Living                      Prior Function            PT Goals (current goals can now be found in the care plan section) Acute  Rehab PT Goals PT Goal Formulation: With patient Time For Goal Achievement: 07/22/20 Potential to Achieve Goals: Good    Frequency    Min 2X/week      PT Plan Current plan remains appropriate    Co-evaluation              AM-PAC PT "6 Clicks" Mobility   Outcome Measure  Help needed turning from your back to your side while in a flat bed without using bedrails?: A Lot Help needed moving from lying on your back to sitting on the side of a flat bed without using bedrails?: A Lot Help needed moving to and from a bed to a chair (including a wheelchair)?: A Lot Help needed standing up from a chair using your arms (e.g., wheelchair or bedside chair)?: A Lot Help needed to walk in hospital room?: Total Help needed climbing 3-5 steps with a railing? : Total 6 Click Score: 10    End of Session   Activity Tolerance: Patient limited by fatigue;Patient limited by pain Patient left: in bed;with call bell/phone within reach Nurse Communication: Mobility status PT Visit Diagnosis: Unsteadiness on feet (R26.81);Other abnormalities of gait and mobility (R26.89);Muscle weakness (generalized) (M62.81)     Time: 4580-9983 PT Time Calculation (min) (ACUTE ONLY): 28 min  Charges:  $Therapeutic Exercise: 8-22 mins $Therapeutic Activity: 8-22 mins                     3:52 PM, 07/12/20 Ocie Bob, MPT Physical Therapist with [facility] [phone] office [phone] mobile phone

## 2020-07-12 NOTE — Discharge Instructions (Signed)
Deconditioning Deconditioning refers to the changes in the body that occur during a period of inactivity. The changes happen in the heart, lungs, and muscles. They make you feel tired and weak (fatigued) and decrease your ability to be active. The three stages of deconditioning include:  Mild deconditioning. This is a change in your ability to do your usual exercise activities, such as running, biking, or swimming.  Moderate deconditioning. This is a change in your ability to do normal everyday activities, such as walking, shopping for groceries, and doing chores.  Severe deconditioning. In this stage, you may not be able to do minimal activity or usual self-care. What are the causes? Deconditioning can occur after only a few days of inactivity. The longer the period of inactivity, the more severe the deconditioning will be, and the longer it will take to return to your previous level of functioning. Deconditioning is often caused by inactivity due to:  Illnesses, such as cancer, stroke, heart attack, fibromyalgia, and chronic fatigue syndrome.  Injuries, especially back injuries, broken bones, and injuries to soft tissues, such as ligaments and tendons.  A Breeanna Galgano stay in the hospital.  Pregnancy, especially if Adelle Zachar periods of bed rest are needed. What increases the risk? The following factors may make you more likely to develop this condition:  Staying in the hospital or being on bed rest.  Obesity.  Poor nutrition.  Being an older adult.  Having an injury or illness that affects your movement and activity. What are the signs or symptoms? Symptoms of this condition include:  Weakness and tiredness.  Shortness of breath with minor physical effort (exertion).  A heartbeat that is faster than normal. You may not notice this without taking your pulse.  Pain or discomfort with activity.  Decreased strength, endurance, and balance.  Difficulty doing your usual forms of  exercise.  Difficulty doing activities of daily living, such as grocery shopping or chores. You may also have problems walking around the house and doing basic self-care, such as getting to the bathroom, preparing meals, or doing laundry. How is this diagnosed? This condition is diagnosed based on your medical history and a physical exam. During the physical exam, your health care provider will check for signs of deconditioning, such as:  Decreased size of muscles.  Decreased strength.  Trouble with balance.  Shortness of breath or a heart rate that is faster than normal after minor exertion. How is this treated? Treatment for this condition involves an exercise program in which activity is increased slowly. Your health care provider will tell you which exercises are right for you. The exercise program will likely include:  Aerobic exercise. This type of exercise helps improve the functioning of the heart, lungs, and muscles.  Strength training. This type of exercise helps increase muscle size and strength. Both of these types of exercise will improve your endurance. You may be referred to a physical therapist who can create a safe strengthening program for you to follow. Follow these instructions at home: Eating and drinking  Eat a healthy, well-balanced diet. This includes: ? Proteins, such as lean meats and fish, to build muscles. ? Fresh fruits and vegetables. ? Carbohydrates, such as whole grains, to boost energy.  Drink enough fluid to keep your urine pale yellow.   Activity  Follow the exercise program that is recommended by your health care provider or physical therapist.  Do not increase your exercise any faster than directed.   General instructions  Take over-the-counter and prescription   medicines only as told by your health care provider.  Do not use any products that contain nicotine or tobacco, such as cigarettes, e-cigarettes, and chewing tobacco. If you need help  quitting, ask your health care provider.  Keep all follow-up visits as told by your health care provider. This is important. Contact a health care provider if:  You are not able to do the recommended exercise program.  You are becoming more and more tired and weak.  You become light-headed when rising to a sitting or standing position.  Your level of endurance decreases after it has improved. Get help right away if you:  Have chest pain.  Are very short of breath.  Have any episodes of fainting. Summary  Deconditioning refers to the changes in the body that occur during a period of inactivity.  Deconditioning happens in the heart, lungs, and muscles. The changes make you feel tired and weak and decrease your ability to be active.  Treatment for deconditioning involves an exercise program in which activity is increased slowly. This information is not intended to replace advice given to you by your health care provider. Make sure you discuss any questions you have with your health care provider. Document Revised: 08/28/2018 Document Reviewed: 08/28/2018 Elsevier Patient Education  2021 Elsevier Inc.  

## 2020-07-13 DIAGNOSIS — R531 Weakness: Secondary | ICD-10-CM | POA: Diagnosis not present

## 2020-07-13 LAB — CBG MONITORING, ED
Glucose-Capillary: 123 mg/dL — ABNORMAL HIGH (ref 70–99)
Glucose-Capillary: 175 mg/dL — ABNORMAL HIGH (ref 70–99)

## 2020-07-13 NOTE — ED Notes (Signed)
RN fed pt, pt ate entire cup of applesauce. Pt expresses no further need at this time.

## 2020-07-13 NOTE — TOC Progression Note (Signed)
Transition of Care [employer]) - Progression Note    Patient Details  Name: [name] MRN: [id number] Date of Birth: 1951/01/20  [facility] CM/SW Contact  Barry Brunner, LCSW Phone Number: 07/13/2020, 2:33 PM  Clinical Narrative:    CSW contacted [facility] to inquire about [facility] ability to accept patient. Debbie reported that she is still waiting on approval from [employer]. TOC to follow.    Expected Discharge Plan: Skilled Nursing Facility Barriers to Discharge: SNF Pending bed offer  Expected Discharge Plan and Services Expected Discharge Plan: Skilled Nursing Facility In-house Referral: Clinical Social Work Discharge Planning Services: CM Consult Post Acute Care Choice: Skilled Nursing Facility Living arrangements for the past 2 months: Single Family Home                 DME Arranged: N/A DME Agency: NA       HH Arranged: NA HH Agency: NA         Social Determinants of Health (SDOH) Interventions    Readmission Risk Interventions Readmission Risk Prevention Plan 05/26/2019 09/20/2018 09/19/2018  Transportation Screening Complete Complete Complete  PCP or Specialist Appt within 3-5 Days Not Complete - -  HRI or Home Care Consult Complete - -  Social Work Consult for Recovery Care Planning/Counseling Complete - -  Palliative Care Screening Not Complete - -  Medication Review [employer]) Complete Complete Complete  PCP or Specialist appointment within 3-5 days of discharge - Complete Complete  PCP/Specialist Appt Not Complete comments - - SNF resident. Sees facility MD  HRI or Home Care Consult - Not Complete -  HRI or Home Care Consult Pt Refusal Comments - Patient is discharging to SNF for rehab -  SW Recovery Care/Counseling Consult - Complete -  Palliative Care Screening - Not Applicable -  Skilled Nursing Facility - Complete -  Some recent data might be hidden

## 2020-07-13 NOTE — ED Notes (Signed)
Asked patient if he needed to be in/out cathed at this time and he shakes head no.

## 2020-07-14 DIAGNOSIS — R531 Weakness: Secondary | ICD-10-CM | POA: Diagnosis not present

## 2020-07-14 LAB — CBG MONITORING, ED
Glucose-Capillary: 141 mg/dL — ABNORMAL HIGH (ref 70–99)
Glucose-Capillary: 174 mg/dL — ABNORMAL HIGH (ref 70–99)

## 2020-07-14 NOTE — ED Notes (Signed)
Pt fed applesauce per request. Pt repositioned in bed, appears to be resting comfortably, call light within reach. Pt denies pain or any further need at this time.

## 2020-07-14 NOTE — TOC Progression Note (Signed)
Transition of Care [facility]) - Progression Note    Patient Details  Name: [name] MRN: [id number] Date of Birth: Oct 21, 1950  Transition of Care [facility]) CM/SW Contact  Barry Brunner, LCSW Phone Number: 07/14/2020, 3:33 PM  Clinical Narrative:    Eunice Blase with [facility] reported that they should be to take patient on 07/15/2020. Debbie reported that she is waiting for Patient's Aunt/POA to sign documentation. TOC to follow.    Expected Discharge Plan: Skilled Nursing Facility Barriers to Discharge: SNF Pending bed offer  Expected Discharge Plan and Services Expected Discharge Plan: Skilled Nursing Facility In-house Referral: Clinical Social Work Discharge Planning Services: CM Consult Post Acute Care Choice: Skilled Nursing Facility Living arrangements for the past 2 months: Single Family Home                 DME Arranged: N/A DME Agency: NA       HH Arranged: NA HH Agency: NA         Social Determinants of Health (SDOH) Interventions    Readmission Risk Interventions Readmission Risk Prevention Plan 05/26/2019 09/20/2018 09/19/2018  Transportation Screening Complete Complete Complete  PCP or Specialist Appt within 3-5 Days Not Complete - -  HRI or Home Care Consult Complete - -  Social Work Consult for Recovery Care Planning/Counseling Complete - -  Palliative Care Screening Not Complete - -  Medication Review Oceanographer) Complete Complete Complete  PCP or Specialist appointment within 3-5 days of discharge - Complete Complete  PCP/Specialist Appt Not Complete comments - - SNF resident. Sees facility MD  HRI or Home Care Consult - Not Complete -  HRI or Home Care Consult Pt Refusal Comments - Patient is discharging to SNF for rehab -  SW Recovery Care/Counseling Consult - Complete -  Palliative Care Screening - Not Applicable -  Skilled Nursing Facility - Complete -  Some recent data might be hidden

## 2020-07-14 NOTE — ED Provider Notes (Signed)
Emergency Medicine Observation Re-evaluation Note  [name] is a 70 y.o. male, seen on rounds today.  Pt initially presented to the ED for complaints of Weakness Currently, the patient is awaiting SNF placement.  Physical Exam  BP (!) 111/52 (BP Location: Left Arm)   Pulse (!) 101   Temp 98.3 F (36.8 C) (Oral)   Resp 18   SpO2 95%  Physical Exam General: Calm and cooperative Cardiac: Well perfused.  Lungs: Even, unlabored respirations.   ED Course / MDM  EKG:EKG Interpretation  Date/Time:  Thursday July 07 2020 15:32:40 EDT Ventricular Rate:  76 PR Interval:    QRS Duration: 104 QT Interval:  416 QTC Calculation: 468 R Axis:   20 Text Interpretation: Sinus rhythm RSR' in V1 or V2, right VCD or RVH No acute changes No significant change since last tracing Confirmed by Derwood Kaplan [phone]) on 07/07/2020 4:10:19 PM   I have reviewed the labs performed to date as well as medications administered while in observation.  Recent changes in the last 24 hours include N/A.  Plan  Current plan is for SNF placement.   In review of blood culture the patient's susceptibilities have come back for his Klebsiella UTI.  He completed a course of Levaquin.  He is not altered.  No fevers.  No symptoms of UTI at this time.  Patient is resistant to multiple antibiotics by culture.  Without symptoms, however, would not escalate therapy at this time.  Continue to monitor for symptoms.     Maia Plan, MD 07/14/20 [phone]

## 2020-07-14 NOTE — ED Notes (Signed)
Rounded on pt. Pt was asked about needing to be cath'd and he stated no. Will reassess later.

## 2020-07-15 DIAGNOSIS — R531 Weakness: Secondary | ICD-10-CM | POA: Diagnosis not present

## 2020-07-15 LAB — CBG MONITORING, ED: Glucose-Capillary: 136 mg/dL — ABNORMAL HIGH (ref 70–99)

## 2020-07-15 NOTE — TOC Progression Note (Addendum)
Transition of Care [facility]) - Progression Note    Patient Details  Name: [name] MRN: [id number] Date of Birth: November 07, 1950  [facility] Care [facility]) CM/SW Contact  [place] Cellar, RN Phone Number: 07/15/2020, 1:55 PM  Clinical Narrative:    LVMM for [facility] requesting call back with confirmation on patient discharging as planned.   Call to [facility] [phone] and spoke with Debbie. Eunice Blase states she is working to get his April check from Ms. Long as well as POA paperwork signed. States she will call back later today with confirmation.   Received call from Debbie @ [facility]-spoke with May-other aunt who states she is planning to come to facility this afternoon to sign all paperwork. Confirmed patient can admit Saturday if unable to get paperwork completed today.    Expected Discharge Plan: Skilled Nursing Facility Barriers to Discharge: SNF Pending bed offer  Expected Discharge Plan and Services Expected Discharge Plan: Skilled Nursing Facility In-house Referral: Clinical Social Work Discharge Planning Services: CM Consult Post Acute Care Choice: Skilled Nursing Facility Living arrangements for the past 2 months: Single Family Home                 DME Arranged: N/A DME Agency: NA       HH Arranged: NA HH Agency: NA         Social Determinants of Health (SDOH) Interventions    Readmission Risk Interventions Readmission Risk Prevention Plan 05/26/2019 09/20/2018 09/19/2018  Transportation Screening Complete Complete Complete  PCP or Specialist Appt within 3-5 Days Not Complete - -  HRI or Home Care Consult Complete - -  Social Work Consult for Recovery Care Planning/Counseling Complete - -  Palliative Care Screening Not Complete - -  Medication Review Oceanographer) Complete Complete Complete  PCP or Specialist appointment within 3-5 days of discharge - Complete Complete  PCP/Specialist Appt Not Complete comments - - SNF resident. Sees facility MD  HRI or  Home Care Consult - Not Complete -  HRI or Home Care Consult Pt Refusal Comments - Patient is discharging to SNF for rehab -  SW Recovery Care/Counseling Consult - Complete -  Palliative Care Screening - Not Applicable -  Skilled Nursing Facility - Complete -  Some recent data might be hidden

## 2020-07-15 NOTE — TOC Progression Note (Signed)
Transition of Care [facility]) - Progression Note    Patient Details  Name: [name] MRN: [id number] Date of Birth: 1950-09-18  Transition of Care [facility]) CM/SW Contact  Taylor Cellar, RN Phone Number: 07/15/2020, 5:05 PM  Clinical Narrative:    Sherron Monday to Debbie @ [facility]-confirmed all paperwork had been completed and patient could transfer tomorrow. Please call Debbie in the AM and set up transfer time and room #.   Expected Discharge Plan: Skilled Nursing Facility Barriers to Discharge: SNF Pending bed offer  Expected Discharge Plan and Services Expected Discharge Plan: Skilled Nursing Facility In-house Referral: Clinical Social Work Discharge Planning Services: CM Consult Post Acute Care Choice: Skilled Nursing Facility Living arrangements for the past 2 months: Single Family Home                 DME Arranged: N/A DME Agency: NA       HH Arranged: NA HH Agency: NA         Social Determinants of Health (SDOH) Interventions    Readmission Risk Interventions Readmission Risk Prevention Plan 05/26/2019 09/20/2018 09/19/2018  Transportation Screening Complete Complete Complete  PCP or Specialist Appt within 3-5 Days Not Complete - -  HRI or Home Care Consult Complete - -  Social Work Consult for Recovery Care Planning/Counseling Complete - -  Palliative Care Screening Not Complete - -  Medication Review Oceanographer) Complete Complete Complete  PCP or Specialist appointment within 3-5 days of discharge - Complete Complete  PCP/Specialist Appt Not Complete comments - - SNF resident. Sees facility MD  HRI or Home Care Consult - Not Complete -  HRI or Home Care Consult Pt Refusal Comments - Patient is discharging to SNF for rehab -  SW Recovery Care/Counseling Consult - Complete -  Palliative Care Screening - Not Applicable -  Skilled Nursing Facility - Complete -  Some recent data might be hidden

## 2020-07-16 DIAGNOSIS — R531 Weakness: Secondary | ICD-10-CM | POA: Diagnosis not present

## 2020-07-16 NOTE — ED Notes (Addendum)
Pt had large bowel movement. Pt cleaned, new gown, sheets, brief placed on pt. Pt sacrum dressing was soiled so new dressing was applied and dated. Pt repositioned in bed and comfortable. Asked pt if he needed to be cath'd and pt shook his head no. Pt denies any other needs at this time. Will continue to monitor pt.

## 2020-07-16 NOTE — ED Notes (Signed)
Ordered obtained for indwelling catheter

## 2020-07-16 NOTE — ED Notes (Signed)
Patient found to have a large amount of stool on himself and all over bed running out of incontinence brief. Patient also had dried stool on his hand and legs. I gave patient bath at this time. Patient had complete linen and gown change.

## 2020-07-16 NOTE — ED Notes (Signed)
Pt asked again if needed to use the bathroom or be cath'd and pt shakes his head no. Pt checked on and asked if needed anything to drink and pt denied. Will continue to monitor.

## 2020-07-16 NOTE — ED Notes (Signed)
 dark, urine drained from urinary drainage bag

## 2020-07-16 NOTE — TOC Transition Note (Signed)
Transition of Care [facility]) - CM/SW Discharge Note   Patient Details  Name: [name] MRN: [id number] Date of Birth: April 30, 1950  Transition of Care [facility]) CM/SW Contact:  Barry Brunner, LCSW Phone Number: 07/16/2020, 12:53 PM   Clinical Narrative:    [facility] agreeable to take patient on 07/16/2020. CSW faxed d/c summary, completed med necessity. Nurse to call report and EMS. TOC signing off.    Final next level of care: Long Term Nursing Home Barriers to Discharge: Barriers Resolved   Patient Goals and CMS Choice Patient states their goals for this hospitalization and ongoing recovery are:: ALF CMS Medicare.gov Compare Post Acute Care list provided to:: Patient Choice offered to / list presented to : Patient  Discharge Placement              Patient chooses bed at: [facility] Patient to be transferred to facility by: [facility] Name of family member notified: Donald Siva)   [phone] Patient and family notified of of transfer: 07/16/20  Discharge Plan and Services In-house Referral: Clinical Social Work Discharge Planning Services: CM Consult Post Acute Care Choice: Skilled Nursing Facility          DME Arranged: N/A DME Agency: NA       HH Arranged: NA HH Agency: NA        Social Determinants of Health (SDOH) Interventions     Readmission Risk Interventions Readmission Risk Prevention Plan 05/26/2019 09/20/2018 09/19/2018  Transportation Screening Complete Complete Complete  PCP or Specialist Appt within 3-5 Days Not Complete - -  HRI or Home Care Consult Complete - -  Social Work Consult for Recovery Care Planning/Counseling Complete - -  Palliative Care Screening Not Complete - -  Medication Review Oceanographer) Complete Complete Complete  PCP or Specialist appointment within 3-5 days of discharge - Complete Complete  PCP/Specialist Appt Not Complete comments - - SNF resident. Sees facility MD  HRI or Home Care Consult - Not Complete -   HRI or Home Care Consult Pt Refusal Comments - Patient is discharging to SNF for rehab -  SW Recovery Care/Counseling Consult - Complete -  Palliative Care Screening - Not Applicable -  Skilled Nursing Facility - Complete -  Some recent data might be hidden

## 2020-07-16 NOTE — ED Notes (Addendum)
This RN asked pt if he needs to void, pt is non-verbal but shakes his head no; Bladder scan completed with greater than 

## 2020-07-16 NOTE — ED Notes (Signed)
ED TO INPATIENT HANDOFF REPORT  ED Nurse Name and Phone #: [phone]  S Name/Age/Gender [name] 70 y.o. male Room/Bed: APA18/APA18  Code Status   Code Status: Prior  Home/SNF/Other Home Patient oriented to: self Is this baseline? Yes   Triage Complete: Triage complete  Chief Complaint weakness  Triage Note Pt with generalized weakness and unable to ambulate, nonverbal.  [name] is caretaker and POA. Aunt is trying to pt  place in long term facility.     Allergies No Known Allergies  Level of Care/Admitting Diagnosis ED Disposition    ED Disposition Condition Comment   Discharge  The patient appears reasonably screened and/or stabilized for discharge and I doubt any other medical condition or other [facility] requiring further screening, evaluation, or treatment in the ED exists or is present at this time prior to discharge.       B Medical/Surgery History Past Medical History:  Diagnosis Date  . Diabetes mellitus without complication (HCC)   . History of gout   . History of kidney stones   . Hypertension   . Mentally challenged   . Urinary retention    Past Surgical History:  Procedure Laterality Date  . CHOLECYSTECTOMY N/A 05/25/2019   Procedure: OPEN CHOLECYSTECTOMY;  Surgeon: [name], MD;  Location: [facility];  Service: General;  Laterality: N/A;  . CYSTOSCOPY W/ URETERAL STENT PLACEMENT Bilateral 06/20/2016   Procedure: CYSTOSCOPY WITH RETROGRADE PYELOGRAM/URETERAL STENT PLACEMENT;  Surgeon: Malen Gauze, MD;  Location: [facility];  Service: Urology;  Laterality: Bilateral;  . CYSTOSCOPY WITH RETROGRADE PYELOGRAM, URETEROSCOPY AND STENT PLACEMENT Bilateral 08/20/2016   Procedure: CYSTOSCOPY WITH BILATERAL RETROGRADE PYELOGRAM, BILATERAL URETEROSCOPY,  LASER LITHOTRIPSY OF RIGHT URETERAL CALCULI, STONE BASKET EXTRACTION LEFT URETERAL CALCULI  AND BILATERAL URETERAL STENT EXCHANGE;  Surgeon: Malen Gauze, MD;  Location: [facility];  Service: Urology;   Laterality: Bilateral;  . CYSTOSCOPY WITH RETROGRADE PYELOGRAM, URETEROSCOPY AND STENT PLACEMENT Left 01/14/2019   Procedure: CYSTOSCOPY WITH LEFT RETROGRADE PYELOGRAM, LEFT URETEROSCOPY AND LEFT URETERAL STENT PLACEMENT;  Surgeon: Malen Gauze, MD;  Location: [facility];  Service: Urology;  Laterality: Left;  . CYSTOSCOPY WITH URETHRAL DILATATION  01/14/2019   Procedure: CYSTOSCOPY WITH URETHRAL DILATATION;  Surgeon: Malen Gauze, MD;  Location: [facility];  Service: Urology;;  . HOLMIUM LASER APPLICATION Bilateral 08/20/2016   Procedure: HOLMIUM LASER APPLICATION;  Surgeon: Malen Gauze, MD;  Location: [facility];  Service: Urology;  Laterality: Bilateral;  . HOLMIUM LASER APPLICATION Left 01/14/2019   Procedure: HOLMIUM LASER APPLICATION;  Surgeon: Malen Gauze, MD;  Location: [facility];  Service: Urology;  Laterality: Left;  . IR CATHETER TUBE CHANGE  07/04/2018  . IR EXCHANGE BILIARY DRAIN  06/09/2018  . IR EXCHANGE BILIARY DRAIN  10/20/2018  . IR EXCHANGE BILIARY DRAIN  01/12/2019  . IR EXCHANGE BILIARY DRAIN  04/07/2019  . IR PERC CHOLECYSTOSTOMY  05/24/2018  . LAPAROTOMY N/A 08/25/2018   Procedure: EXPLORATORY LAPAROTOMY reduction of volvulus;  Surgeon: [name], MD;  Location: [facility];  Service: General;  Laterality: N/A;  . LAPAROTOMY N/A 05/25/2019   Procedure: EXPLORATORY LAPAROTOMY;  Surgeon: [name], MD;  Location: [facility];  Service: General;  Laterality: N/A;     A IV Location/Drains/Wounds Patient Lines/Drains/Airways Status    Active Line/Drains/Airways    Name Placement date Placement time Site Days   Urethral Catheter Beth, RN  Double-lumen 16 Fr. 07/16/20  0802  Double-lumen  less than 1  External Urinary Catheter 07/14/20  0953  --  2   Pressure Injury 06/15/20 Buttocks Right Stage 2 -  Partial thickness loss of dermis presenting as a shallow open injury with a red, pink wound bed without slough. 06/15/20  1430  -- 31          Intake/Output  Last 24 hours  Intake/Output Summary (Last 24 hours) at 07/16/2020 1129 Last data filed at 07/16/2020 0815 Gross per 24 hour  Intake --  Output 1800 ml  Net -1800 ml    Labs/Imaging Results for orders placed or performed during the hospital encounter of 07/07/20 (from the past 48 hour(s))  POC CBG, ED     Status: Abnormal   Collection Time: 07/14/20  5:17 PM  Result Value Ref Range   Glucose-Capillary 174 (H) 70 - 99 mg/dL    Comment: Glucose reference range applies only to samples taken after fasting for at least 8 hours.  POC CBG, ED     Status: Abnormal   Collection Time: 07/15/20  7:49 AM  Result Value Ref Range   Glucose-Capillary 136 (H) 70 - 99 mg/dL    Comment: Glucose reference range applies only to samples taken after fasting for at least 8 hours.   No results found.  Pending Labs Unresulted Labs (From admission, onward)          Start     Ordered   07/07/20 1517  CBC WITH DIFFERENTIAL  (Undifferentiated presentation (screening labs and basic nursing orders))  ONCE - STAT,   STAT        07/07/20 1517          Vitals/Pain Today's Vitals   07/16/20 0730 07/16/20 0800 07/16/20 1000 07/16/20 1025  BP: 110/69 115/61 104/60 104/60  Pulse:    84  Resp:    17  Temp:    98 F (36.7 C)  TempSrc:    Oral  SpO2:    99%  PainSc:        Isolation Precautions No active isolations  Medications Medications  acetaminophen (TYLENOL) tablet 1,000 mg (1,000 mg Oral Given 07/13/20 0923)  aspirin EC tablet 81 mg (81 mg Oral Given 07/16/20 1023)  atorvastatin (LIPITOR) tablet 20 mg (20 mg Oral Given 07/15/20 2127)  bisacodyl (DULCOLAX) suppository 10 mg (10 mg Rectal Given 07/16/20 1024)  finasteride (PROSCAR) tablet 5 mg (5 mg Oral Given 07/16/20 1023)  linaclotide (LINZESS) capsule 145 mcg (145 mcg Oral Not Given 07/16/20 1024)  magnesium oxide (MAG-OX) tablet 400 mg (400 mg Oral Given 07/16/20 1023)  nitrofurantoin (MACRODANTIN) capsule 50 mg (50 mg Oral Given 07/15/20 2128)   tamsulosin (FLOMAX) capsule 0.4 mg (0 mg Oral Hold 07/07/20 1822)  metFORMIN (GLUCOPHAGE) tablet 1,000 mg (1,000 mg Oral Given 07/16/20 1023)  potassium chloride (KLOR-CON) packet 20 mEq (20 mEq Oral Given 07/14/20 1718)  levofloxacin (LEVAQUIN) tablet 750 mg (750 mg Oral Given 07/14/20 0919)  sodium chloride 0.9 % bolus 500 mL (0 mLs Intravenous Stopped 07/11/20 1112)    Mobility non-ambulatory High fall risk   Focused Assessments    R Recommendations: See Admitting Provider Note  Report given to:   Additional Notes:

## 2020-07-16 NOTE — ED Notes (Signed)
Attempted to call report to Debbie at [facility] per SW note; no answer; left VM

## 2020-07-16 NOTE — ED Notes (Signed)
Pt meds crushed and given in applesauce; pt consumed 100% of one applesauce container

## 2020-07-16 NOTE — ED Notes (Signed)
Pt off unit by [facility]

## 2020-07-30 ENCOUNTER — Emergency Department (HOSPITAL_COMMUNITY): Payer: Medicare Other

## 2020-07-30 ENCOUNTER — Encounter (HOSPITAL_COMMUNITY): Payer: Self-pay

## 2020-07-30 ENCOUNTER — Emergency Department (HOSPITAL_COMMUNITY)
Admission: EM | Admit: 2020-07-30 | Discharge: 2020-07-30 | Disposition: A | Discharge status: Home / Self Care | Payer: Medicare Other | Attending: Emergency Medicine | Admitting: Emergency Medicine

## 2020-07-30 DIAGNOSIS — Z8616 Personal history of COVID-19: Secondary | ICD-10-CM | POA: Diagnosis not present

## 2020-07-30 DIAGNOSIS — Z7984 Long term (current) use of oral hypoglycemic drugs: Secondary | ICD-10-CM | POA: Diagnosis not present

## 2020-07-30 DIAGNOSIS — Z7982 Long term (current) use of aspirin: Secondary | ICD-10-CM | POA: Diagnosis not present

## 2020-07-30 DIAGNOSIS — I10 Essential (primary) hypertension: Secondary | ICD-10-CM | POA: Insufficient documentation

## 2020-07-30 DIAGNOSIS — R1084 Generalized abdominal pain: Secondary | ICD-10-CM | POA: Insufficient documentation

## 2020-07-30 DIAGNOSIS — E119 Type 2 diabetes mellitus without complications: Secondary | ICD-10-CM | POA: Insufficient documentation

## 2020-07-30 DIAGNOSIS — R1030 Lower abdominal pain, unspecified: Secondary | ICD-10-CM

## 2020-07-30 LAB — CBC WITH DIFFERENTIAL/PLATELET
Abs Immature Granulocytes: 0.01 10*3/uL (ref 0.00–0.07)
Basophils Absolute: 0 10*3/uL (ref 0.0–0.1)
Basophils Relative: 1 %
Eosinophils Absolute: 0.1 10*3/uL (ref 0.0–0.5)
Eosinophils Relative: 2 %
HCT: 39.4 % (ref 39.0–52.0)
Hemoglobin: 11.9 g/dL — ABNORMAL LOW (ref 13.0–17.0)
Immature Granulocytes: 0 %
Lymphocytes Relative: 20 %
Lymphs Abs: 1.2 10*3/uL (ref 0.7–4.0)
MCH: 26.3 pg (ref 26.0–34.0)
MCHC: 30.2 g/dL (ref 30.0–36.0)
MCV: 87 fL (ref 80.0–100.0)
Monocytes Absolute: 0.5 10*3/uL (ref 0.1–1.0)
Monocytes Relative: 9 %
Neutro Abs: 4.2 10*3/uL (ref 1.7–7.7)
Neutrophils Relative %: 68 %
Platelets: 253 10*3/uL (ref 150–400)
RBC: 4.53 MIL/uL (ref 4.22–5.81)
RDW: 16 % — ABNORMAL HIGH (ref 11.5–15.5)
WBC: 6.1 10*3/uL (ref 4.0–10.5)
nRBC: 0 % (ref 0.0–0.2)

## 2020-07-30 LAB — COMPREHENSIVE METABOLIC PANEL
ALT: 10 U/L (ref 0–44)
AST: 16 U/L (ref 15–41)
Albumin: 3 g/dL — ABNORMAL LOW (ref 3.5–5.0)
Alkaline Phosphatase: 82 U/L (ref 38–126)
Anion gap: 11 (ref 5–15)
BUN: 11 mg/dL (ref 8–23)
CO2: 28 mmol/L (ref 22–32)
Calcium: 9 mg/dL (ref 8.9–10.3)
Chloride: 92 mmol/L — ABNORMAL LOW (ref 98–111)
Creatinine, Ser: 0.69 mg/dL (ref 0.61–1.24)
GFR, Estimated: >60 mL/min (ref >60)
Glucose, Bld: 196 mg/dL — ABNORMAL HIGH (ref 70–99)
Potassium: 3 mmol/L — ABNORMAL LOW (ref 3.5–5.1)
Sodium: 131 mmol/L — ABNORMAL LOW (ref 135–145)
Total Bilirubin: 0.5 mg/dL (ref 0.3–1.2)
Total Protein: 7.9 g/dL (ref 6.5–8.1)

## 2020-07-30 LAB — URINALYSIS, ROUTINE W REFLEX MICROSCOPIC
Bilirubin Urine: NEGATIVE
Glucose, UA: 50 mg/dL — AB
Ketones, ur: NEGATIVE mg/dL
Nitrite: NEGATIVE
Protein, ur: >=300 mg/dL — AB
RBC / HPF: >50 RBC/hpf — ABNORMAL HIGH (ref 0–5)
Specific Gravity, Urine: 1.015 (ref 1.005–1.030)
WBC, UA: >50 WBC/hpf — ABNORMAL HIGH (ref 0–5)
pH: 6 (ref 5.0–8.0)

## 2020-07-30 MED ORDER — IOHEXOL 300 MG/ML  SOLN
100.0000 mL | Freq: Once | INTRAMUSCULAR | Status: AC | PRN
  Administered 2020-07-30: 100 mL via INTRAVENOUS

## 2020-07-30 MED ORDER — POTASSIUM CHLORIDE 10 MEQ/100ML IV SOLN
10.0000 meq | Freq: Once | INTRAVENOUS | Status: AC
  Administered 2020-07-30: 10 meq via INTRAVENOUS
  Filled 2020-07-30: qty 100

## 2020-07-30 MED ORDER — SODIUM CHLORIDE 0.9 % IV BOLUS
500.0000 mL | Freq: Once | INTRAVENOUS | Status: AC
  Administered 2020-07-30: 500 mL via INTRAVENOUS

## 2020-07-30 NOTE — ED Notes (Signed)
Pt unable to sign discharge instructions. 

## 2020-07-30 NOTE — Discharge Instructions (Addendum)
Have your doctor follow-up on your urine culture on Monday

## 2020-07-30 NOTE — ED Provider Notes (Signed)
[facility] EMERGENCY DEPARTMENT Provider Note   CSN: [id number] Arrival date & time: 07/30/20  1548     History Chief Complaint  Patient presents with  . Abdominal Pain    Candis Shinelbert S Carollo is a 70 y.o. male.  Patient with minimal abdominal discomfort and history of a bowel obstruction.  Patient sent from the nursing home to rule out bowel obstruction.  No vomiting.  Patient also has indwelling Foley in it is chronically showing infection.  The history is provided by the nursing home and medical records. No language interpreter was used.  Abdominal Pain Pain location:  Generalized Pain quality: aching   Pain radiates to:  Does not radiate Pain severity:  No pain Onset quality:  Sudden Timing:  Constant Progression:  Waxing and waning Chronicity:  Recurrent Context: not alcohol use        Past Medical History:  Diagnosis Date  . Diabetes mellitus without complication (HCC)   . History of gout   . History of kidney stones   . Hypertension   . Mentally challenged   . Urinary retention     Patient Active Problem List   Diagnosis Date Noted  . SIRS (systemic inflammatory response syndrome) (HCC) 06/15/2020  . Goals of care, counseling/discussion   . Encounter for hospice care discussion   . COVID-19 virus infection 02/28/2020  . Palliative care by specialist   . Pneumoperitoneum 07/04/2019  . Intra-abdominal free air of unknown etiology 07/04/2019  . Cystitis   . Chronic cholecystitis 05/21/2019  . Chronic constipation 03/31/2019  . Cholecystostomy care (HCC) 11/04/2018  . Acute lower UTI 09/18/2018  . Abdominal distention 09/18/2018  . Protein-calorie malnutrition, severe (HCC) 09/09/2018  . Acute blood loss anemia 09/09/2018  . Gangrenous cholecystitis 09/05/2018  . Pneumatosis intestinalis   . Gall bladder disease 08/25/2018  . Small bowel ischemia (HCC)   . Acute gangrenous cholecystitis 05/24/2018  . SBO (small bowel obstruction) (HCC) 05/24/2018  .  Abdominal distension   . Pressure injury of skin 04/09/2018  . C. difficile diarrhea 04/08/2018  . Bilateral hydronephrosis 04/08/2018  . Pneumoperitoneum of unknown etiology   . Diarrhea 04/07/2018  . Nonverbal 04/07/2018  . Hydronephrosis with urinary obstruction due to ureteral calculus 06/20/2016  . Sepsis secondary to UTI (HCC) 06/20/2016  . Fever 06/19/2016  . Generalized weakness 06/19/2016  . Diabetes mellitus type 2, noninsulin dependent (HCC) 06/19/2016  . Severe sepsis with septic shock (HCC) 06/19/2016  . UTI (urinary tract infection) 06/19/2016  . Hematuria 06/19/2016  . AKI (acute kidney injury) (HCC) 06/19/2016  . Hyponatremia 06/19/2016  . Hypokalemia 06/19/2016  . BPH (benign prostatic hyperplasia) 06/19/2016  . Urine retention 05/07/2014  . Intellectual disability 05/07/2014  . Hyperlipidemia 05/07/2014  . HTN (hypertension) 05/07/2014  . Renal calculus 01/14/2012    Past Surgical History:  Procedure Laterality Date  . CHOLECYSTECTOMY N/A 05/25/2019   Procedure: OPEN CHOLECYSTECTOMY;  Surgeon: Lucretia RoersBridges, Lindsay C, MD;  Location: [facility];  Service: General;  Laterality: N/A;  . CYSTOSCOPY W/ URETERAL STENT PLACEMENT Bilateral 06/20/2016   Procedure: CYSTOSCOPY WITH RETROGRADE PYELOGRAM/URETERAL STENT PLACEMENT;  Surgeon: Malen GauzePatrick L McKenzie, MD;  Location: [facility];  Service: Urology;  Laterality: Bilateral;  . CYSTOSCOPY WITH RETROGRADE PYELOGRAM, URETEROSCOPY AND STENT PLACEMENT Bilateral 08/20/2016   Procedure: CYSTOSCOPY WITH BILATERAL RETROGRADE PYELOGRAM, BILATERAL URETEROSCOPY,  LASER LITHOTRIPSY OF RIGHT URETERAL CALCULI, STONE BASKET EXTRACTION LEFT URETERAL CALCULI  AND BILATERAL URETERAL STENT EXCHANGE;  Surgeon: Malen GauzeMcKenzie, Patrick L, MD;  Location: [facility];  Service: Urology;  Laterality: Bilateral;  . CYSTOSCOPY WITH RETROGRADE PYELOGRAM, URETEROSCOPY AND STENT PLACEMENT Left 01/14/2019   Procedure: CYSTOSCOPY WITH LEFT RETROGRADE PYELOGRAM, LEFT URETEROSCOPY AND  LEFT URETERAL STENT PLACEMENT;  Surgeon: Malen Gauze, MD;  Location: [facility];  Service: Urology;  Laterality: Left;  . CYSTOSCOPY WITH URETHRAL DILATATION  01/14/2019   Procedure: CYSTOSCOPY WITH URETHRAL DILATATION;  Surgeon: Malen Gauze, MD;  Location: [facility];  Service: Urology;;  . HOLMIUM LASER APPLICATION Bilateral 08/20/2016   Procedure: HOLMIUM LASER APPLICATION;  Surgeon: Malen Gauze, MD;  Location: [facility];  Service: Urology;  Laterality: Bilateral;  . HOLMIUM LASER APPLICATION Left 01/14/2019   Procedure: HOLMIUM LASER APPLICATION;  Surgeon: Malen Gauze, MD;  Location: [facility];  Service: Urology;  Laterality: Left;  . IR CATHETER TUBE CHANGE  07/04/2018  . IR EXCHANGE BILIARY DRAIN  06/09/2018  . IR EXCHANGE BILIARY DRAIN  10/20/2018  . IR EXCHANGE BILIARY DRAIN  01/12/2019  . IR EXCHANGE BILIARY DRAIN  04/07/2019  . IR PERC CHOLECYSTOSTOMY  05/24/2018  . LAPAROTOMY N/A 08/25/2018   Procedure: EXPLORATORY LAPAROTOMY reduction of volvulus;  Surgeon: [name], MD;  Location: [facility];  Service: General;  Laterality: N/A;  . LAPAROTOMY N/A 05/25/2019   Procedure: EXPLORATORY LAPAROTOMY;  Surgeon: [name], MD;  Location: [facility];  Service: General;  Laterality: N/A;       Family History  Problem Relation Age of Onset  . Gallbladder disease Other     Social History   Tobacco Use  . Smoking status: Never Smoker  . Smokeless tobacco: Never Used  Vaping Use  . Vaping Use: Never used  Substance Use Topics  . Alcohol use: No  . Drug use: No    Home Medications Prior to Admission medications   Medication Sig Start Date End Date Taking? Authorizing Provider  acetaminophen (TYLENOL) 500 MG tablet Take 650 mg by mouth every 6 (six) hours as needed for mild pain or headache.   Yes [provider]  aspirin EC 81 MG tablet Take 1 tablet (81 mg total) by mouth daily with breakfast. 05/29/19  Yes Emokpae, Courage, MD  atorvastatin (LIPITOR)  20 MG tablet Take 20 mg by mouth at bedtime. 06/09/19  Yes [provider]  finasteride (PROSCAR) 5 MG tablet Take 1 tablet (5 mg total) by mouth daily. 05/30/20  Yes McKenzie, Mardene Celeste, MD  magnesium oxide (MAG-OX) 400 MG tablet Take 1 tablet (400 mg total) by mouth daily. 10/23/18  Yes Sharee Holster, NP  metFORMIN (GLUCOPHAGE) 1000 MG tablet Take 1,000 mg by mouth 2 (two) times daily. 04/27/20  Yes [provider]  bisacodyl (DULCOLAX) 10 MG suppository Place 1 suppository (10 mg total) rectally daily. Patient not taking: Reported on 07/30/2020 03/03/20   TatOnalee Hua, MD  FEROSUL 325 (65 Fe) MG tablet Take 325 mg by mouth daily. Patient not taking: No sig reported 04/28/20   [provider]  linaclotide Karlene Einstein) 145 MCG CAPS capsule Take 145 mcg by mouth daily before breakfast. Patient not taking: Reported on 07/30/2020    [provider]  nitrofurantoin (MACRODANTIN) 50 MG capsule Take 1 capsule (50 mg total) by mouth at bedtime. Patient not taking: Reported on 07/30/2020 05/30/20   Malen Gauze, MD  Potassium Chloride ER 20 MEQ TBCR Take 20 mEq by mouth every Tuesday, Thursday, Saturday, and Sunday. 1 tab daily by mouth Patient not taking: Reported on 07/30/2020 05/30/19   Shon Hale, MD  simethicone (MYLICON) 40 MG/0.6ML drops Take 1.2 mLs (80 mg total) by mouth 4 (four) times daily as needed for flatulence. Patient not taking: No sig reported 04/05/20   Malissa Hippo, MD  tamsulosin (FLOMAX) 0.4 MG CAPS capsule Take 1 capsule (0.4 mg total) by mouth in the morning and at bedtime. Patient not taking: Reported on 07/30/2020 05/30/20   Malen Gauze, MD  vancomycin (VANCOCIN) 125 MG capsule Take 125 mg by mouth every 6 (six) hours. Patient not taking: No sig reported 06/25/20   [provider]    Allergies    Patient has no known allergies.  Review of Systems   Review of Systems  Unable to perform ROS: Mental status change   Gastrointestinal: Positive for abdominal pain.    Physical Exam Updated Vital Signs BP 113/71   Pulse 81   Temp 97.6 F (36.4 C) (Oral)   Resp 12   Ht 6' (1.829 m)   Wt 72.6 kg   SpO2 100%   BMI 21.70 kg/m   Physical Exam Vitals and nursing note reviewed.  Constitutional:      Appearance: He is well-developed.  HENT:     Head: Normocephalic.     Nose: Nose normal.  Eyes:     General: No scleral icterus.    Conjunctiva/sclera: Conjunctivae normal.  Neck:     Thyroid: No thyromegaly.  Cardiovascular:     Rate and Rhythm: Normal rate and regular rhythm.     Heart sounds: No murmur heard. No friction rub. No gallop.   Pulmonary:     Breath sounds: No stridor. No wheezing or rales.  Chest:     Chest wall: No tenderness.  Abdominal:     General: There is distension.     Tenderness: There is no abdominal tenderness. There is no rebound.  Genitourinary:    Comments: Foley in place Musculoskeletal:     Cervical back: Neck supple.  Lymphadenopathy:     Cervical: No cervical adenopathy.  Skin:    Findings: No erythema or rash.  Neurological:     Mental Status: He is alert.     Motor: No abnormal muscle tone.     Coordination: Coordination normal.     Comments: Patient at his normal mental status but is awake but nonverbal  Psychiatric:        Behavior: Behavior normal.     ED Results / Procedures / Treatments   Labs (all labs ordered are listed, but only abnormal results are displayed) Labs Reviewed  CBC WITH DIFFERENTIAL/PLATELET - Abnormal; Notable for the following components:      Result Value   Hemoglobin 11.9 (*)    RDW 16.0 (*)    All other components within normal limits  COMPREHENSIVE METABOLIC PANEL - Abnormal; Notable for the following components:   Sodium 131 (*)    Potassium 3.0 (*)    Chloride 92 (*)    Glucose, Bld 196 (*)    Albumin 3.0 (*)    All other components within normal limits  URINALYSIS, ROUTINE W REFLEX MICROSCOPIC - Abnormal;  Notable for the following components:   APPearance CLOUDY (*)    Glucose, UA 50 (*)    Hgb urine dipstick MODERATE (*)    Protein, ur >=300 (*)    Leukocytes,Ua LARGE (*)    RBC / HPF >50 (*)    WBC, UA >50 (*)    Bacteria, UA MANY (*)    All other components within normal limits  URINE CULTURE    EKG None  Radiology CT ABDOMEN PELVIS W CONTRAST  Result Date: 07/30/2020 CLINICAL DATA:  Abdominal abscess/infection suspected. Abdominal pain. Pain radiates into the back. EXAM: CT ABDOMEN AND PELVIS WITH CONTRAST TECHNIQUE: Multidetector CT imaging of the abdomen and pelvis was performed using the standard protocol following bolus administration of intravenous contrast. CONTRAST:  OMNIPAQUE IOHEXOL 300 MG/ML  SOLN COMPARISON:  06/15/2020 noncontrast CT FINDINGS: Lower chest: No acute abnormality. Hepatobiliary: Status post cholecystectomy. Normal appearance of the liver. Pancreas: Unremarkable. No pancreatic ductal dilatation or surrounding inflammatory changes. Spleen: Normal in size without focal abnormality. Adrenals/Urinary Tract: Adrenal glands are normal in appearance. RIGHT kidney is normal in appearance. No hydronephrosis. RIGHT ureter is normal. Numerous intrarenal stones identified in the LEFT kidney, largest approximately 1.2 centimeters in the midpole region. There is no hydronephrosis. LEFT ureter is unremarkable. Foley catheter decompresses the urinary bladder. Stomach/Bowel: The stomach is normal in appearance. Small bowel loops are unremarkable. Again noted is marked distension of the colon, with sigmoid dilatation 11.2 centimeters and stable compared with multiple prior studies. There is no evidence for free intraperitoneal air. No obstructing lesion identified. There is mild thickening of the rectum which is chronic. Vascular/Lymphatic: No significant vascular findings are present. No enlarged abdominal or pelvic lymph nodes. Reproductive: Prostatic seeds are present. Other: No  abdominal wall hernia or abnormality. No abdominopelvic ascites. Musculoskeletal: Degenerative changes are present in the LOWER thoracic and lumbar spine. Chronic anterior compression fracture of T12. IMPRESSION: 1. Stable marked distension of the colon. No obstructing lesion identified. 2. 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 3. No evidence for free intraperitoneal air. 4. Foley catheter decompressing the urinary bladder. 5. Nonobstructing LEFT intrarenal calculi. 6. Status post cholecystectomy. 7. Prostatic seeds. 8. Chronic T12 compression fracture. 9. Aortic Atherosclerosis (ICD10-I70.0). Electronically Signed   By: Norva Pavlov M.D.   On: 07/30/2020 19:10    Procedures Procedures   Medications Ordered in ED Medications  sodium chloride 0.9 % bolus 500 mL (0 mLs Intravenous Stopped 07/30/20 1825)  potassium chloride 10 mEq in 100 mL IVPB (0 mEq Intravenous Stopped 07/30/20 2031)  iohexol (OMNIPAQUE) 300 MG/ML solution 100 mL (100 mLs Intravenous Contrast Given 07/30/20 1817)    ED Course  I have reviewed the triage vital signs and the nursing notes.  Pertinent labs & imaging results that were available during my care of the patient were reviewed by me and considered in my medical decision making (see chart for details).    MDM Rules/Calculators/A&P                          Patient nontoxic without any fever no white count.  Patient with indwelling Foley and urine that appears to be infected but he has been on antibiotics numerous times.  We will culture his urine and discharge him back to the nursing home for them to follow-up the results.  Patient is also afebrile not tachycardic normal blood pressure Final Clinical Impression(s) / ED Diagnoses Final diagnoses:  Lower abdominal pain    Rx / DC Orders ED Discharge Orders    None       Bethann Berkshire, MD 08/02/20 1002

## 2020-07-30 NOTE — ED Notes (Signed)
Attempted to call [facility] to give report with no response. Will attempt contact again.

## 2020-07-30 NOTE — ED Notes (Signed)
Nurse into room to stop infusion, IV found in the floor with medication pooled into the floor. Unsure how much of the infusion actually was successfully infused.

## 2020-07-30 NOTE — ED Triage Notes (Signed)
Pt from [facility] c/o 5 day abdominal pain that radiates to his back. Pt is non verbal, but nods & gestures appropriately. Has history of bowel obstruction.

## 2020-08-03 LAB — URINE CULTURE: Culture: >=100000 — AB

## 2020-08-04 ENCOUNTER — Other Ambulatory Visit (HOSPITAL_COMMUNITY): Payer: Self-pay | Admitting: Family

## 2020-08-04 ENCOUNTER — Other Ambulatory Visit: Payer: Self-pay | Admitting: Family

## 2020-08-04 DIAGNOSIS — R339 Retention of urine, unspecified: Secondary | ICD-10-CM

## 2020-08-04 DIAGNOSIS — G8101 Flaccid hemiplegia affecting right dominant side: Secondary | ICD-10-CM

## 2020-08-10 ENCOUNTER — Other Ambulatory Visit: Payer: Self-pay | Admitting: Internal Medicine

## 2020-08-10 DIAGNOSIS — G8101 Flaccid hemiplegia affecting right dominant side: Secondary | ICD-10-CM

## 2020-08-10 DIAGNOSIS — R339 Retention of urine, unspecified: Secondary | ICD-10-CM

## 2020-08-25 ENCOUNTER — Other Ambulatory Visit: Payer: Self-pay

## 2020-08-25 ENCOUNTER — Ambulatory Visit (HOSPITAL_COMMUNITY)
Admission: RE | Admit: 2020-08-25 | Discharge: 2020-08-25 | Disposition: A | Discharge status: Home / Self Care | Payer: Medicare Other | Source: Ambulatory Visit | Attending: Internal Medicine | Admitting: Internal Medicine

## 2020-08-25 DIAGNOSIS — G8101 Flaccid hemiplegia affecting right dominant side: Secondary | ICD-10-CM | POA: Diagnosis present

## 2020-08-25 DIAGNOSIS — R339 Retention of urine, unspecified: Secondary | ICD-10-CM | POA: Diagnosis present

## 2020-08-25 MED ORDER — GADOBUTROL 1 MMOL/ML IV SOLN
7.0000 mL | Freq: Once | INTRAVENOUS | Status: AC | PRN
  Administered 2020-08-25: 7 mL via INTRAVENOUS

## 2020-08-29 ENCOUNTER — Ambulatory Visit (HOSPITAL_COMMUNITY)
Admission: RE | Admit: 2020-08-29 | Discharge: 2020-08-29 | Disposition: A | Discharge status: Home / Self Care | Payer: Medicare Other | Source: Ambulatory Visit | Attending: Internal Medicine | Admitting: Internal Medicine

## 2020-08-29 ENCOUNTER — Inpatient Hospital Stay (HOSPITAL_COMMUNITY)
Admission: EM | Admit: 2020-08-29 | Discharge: 2020-09-14 | DRG: 471 | Disposition: E | Payer: Medicare Other | Source: Skilled Nursing Facility | Attending: Family Medicine | Admitting: Family Medicine

## 2020-08-29 ENCOUNTER — Other Ambulatory Visit: Payer: Self-pay

## 2020-08-29 ENCOUNTER — Encounter (HOSPITAL_COMMUNITY): Payer: Self-pay

## 2020-08-29 DIAGNOSIS — L89896 Pressure-induced deep tissue damage of other site: Secondary | ICD-10-CM | POA: Clinically undetermined

## 2020-08-29 DIAGNOSIS — R339 Retention of urine, unspecified: Secondary | ICD-10-CM

## 2020-08-29 DIAGNOSIS — Z681 Body mass index (BMI) 19 or less, adult: Secondary | ICD-10-CM

## 2020-08-29 DIAGNOSIS — E870 Hyperosmolality and hypernatremia: Secondary | ICD-10-CM | POA: Diagnosis not present

## 2020-08-29 DIAGNOSIS — J690 Pneumonitis due to inhalation of food and vomit: Secondary | ICD-10-CM | POA: Diagnosis not present

## 2020-08-29 DIAGNOSIS — Z20822 Contact with and (suspected) exposure to covid-19: Secondary | ICD-10-CM | POA: Diagnosis present

## 2020-08-29 DIAGNOSIS — Z8616 Personal history of COVID-19: Secondary | ICD-10-CM

## 2020-08-29 DIAGNOSIS — G9341 Metabolic encephalopathy: Secondary | ICD-10-CM | POA: Diagnosis present

## 2020-08-29 DIAGNOSIS — T17908A Unspecified foreign body in respiratory tract, part unspecified causing other injury, initial encounter: Secondary | ICD-10-CM

## 2020-08-29 DIAGNOSIS — R0603 Acute respiratory distress: Secondary | ICD-10-CM | POA: Diagnosis not present

## 2020-08-29 DIAGNOSIS — M4712 Other spondylosis with myelopathy, cervical region: Secondary | ICD-10-CM | POA: Diagnosis not present

## 2020-08-29 DIAGNOSIS — A4151 Sepsis due to Escherichia coli [E. coli]: Secondary | ICD-10-CM | POA: Diagnosis not present

## 2020-08-29 DIAGNOSIS — E1169 Type 2 diabetes mellitus with other specified complication: Secondary | ICD-10-CM | POA: Diagnosis present

## 2020-08-29 DIAGNOSIS — G8101 Flaccid hemiplegia affecting right dominant side: Secondary | ICD-10-CM | POA: Insufficient documentation

## 2020-08-29 DIAGNOSIS — E119 Type 2 diabetes mellitus without complications: Secondary | ICD-10-CM | POA: Diagnosis not present

## 2020-08-29 DIAGNOSIS — M4626 Osteomyelitis of vertebra, lumbar region: Secondary | ICD-10-CM | POA: Diagnosis present

## 2020-08-29 DIAGNOSIS — F79 Unspecified intellectual disabilities: Secondary | ICD-10-CM

## 2020-08-29 DIAGNOSIS — R531 Weakness: Secondary | ICD-10-CM | POA: Diagnosis present

## 2020-08-29 DIAGNOSIS — Z515 Encounter for palliative care: Secondary | ICD-10-CM

## 2020-08-29 DIAGNOSIS — M4646 Discitis, unspecified, lumbar region: Secondary | ICD-10-CM | POA: Diagnosis present

## 2020-08-29 DIAGNOSIS — M4722 Other spondylosis with radiculopathy, cervical region: Principal | ICD-10-CM | POA: Diagnosis present

## 2020-08-29 DIAGNOSIS — L8990 Pressure ulcer of unspecified site, unspecified stage: Secondary | ICD-10-CM | POA: Diagnosis present

## 2020-08-29 DIAGNOSIS — N401 Enlarged prostate with lower urinary tract symptoms: Secondary | ICD-10-CM | POA: Diagnosis present

## 2020-08-29 DIAGNOSIS — R29898 Other symptoms and signs involving the musculoskeletal system: Secondary | ICD-10-CM | POA: Diagnosis present

## 2020-08-29 DIAGNOSIS — Z79899 Other long term (current) drug therapy: Secondary | ICD-10-CM

## 2020-08-29 DIAGNOSIS — J9601 Acute respiratory failure with hypoxia: Secondary | ICD-10-CM | POA: Diagnosis not present

## 2020-08-29 DIAGNOSIS — E872 Acidosis, unspecified: Secondary | ICD-10-CM | POA: Diagnosis present

## 2020-08-29 DIAGNOSIS — A498 Other bacterial infections of unspecified site: Secondary | ICD-10-CM | POA: Diagnosis not present

## 2020-08-29 DIAGNOSIS — Z9049 Acquired absence of other specified parts of digestive tract: Secondary | ICD-10-CM | POA: Diagnosis not present

## 2020-08-29 DIAGNOSIS — Z87898 Personal history of other specified conditions: Secondary | ICD-10-CM

## 2020-08-29 DIAGNOSIS — Z87442 Personal history of urinary calculi: Secondary | ICD-10-CM

## 2020-08-29 DIAGNOSIS — M2578 Osteophyte, vertebrae: Secondary | ICD-10-CM | POA: Diagnosis present

## 2020-08-29 DIAGNOSIS — I10 Essential (primary) hypertension: Secondary | ICD-10-CM | POA: Diagnosis not present

## 2020-08-29 DIAGNOSIS — R000 Tachycardia, unspecified: Secondary | ICD-10-CM | POA: Diagnosis not present

## 2020-08-29 DIAGNOSIS — R4701 Aphasia: Secondary | ICD-10-CM | POA: Diagnosis not present

## 2020-08-29 DIAGNOSIS — E785 Hyperlipidemia, unspecified: Secondary | ICD-10-CM | POA: Diagnosis present

## 2020-08-29 DIAGNOSIS — Z66 Do not resuscitate: Secondary | ICD-10-CM | POA: Diagnosis present

## 2020-08-29 DIAGNOSIS — E871 Hypo-osmolality and hyponatremia: Secondary | ICD-10-CM | POA: Diagnosis present

## 2020-08-29 DIAGNOSIS — E861 Hypovolemia: Secondary | ICD-10-CM | POA: Diagnosis present

## 2020-08-29 DIAGNOSIS — M109 Gout, unspecified: Secondary | ICD-10-CM | POA: Diagnosis present

## 2020-08-29 DIAGNOSIS — E43 Unspecified severe protein-calorie malnutrition: Secondary | ICD-10-CM | POA: Diagnosis present

## 2020-08-29 DIAGNOSIS — Z781 Physical restraint status: Secondary | ICD-10-CM

## 2020-08-29 DIAGNOSIS — R338 Other retention of urine: Secondary | ICD-10-CM | POA: Diagnosis present

## 2020-08-29 DIAGNOSIS — M4802 Spinal stenosis, cervical region: Secondary | ICD-10-CM | POA: Diagnosis present

## 2020-08-29 DIAGNOSIS — Z7984 Long term (current) use of oral hypoglycemic drugs: Secondary | ICD-10-CM

## 2020-08-29 DIAGNOSIS — G9520 Unspecified cord compression: Secondary | ICD-10-CM | POA: Diagnosis present

## 2020-08-29 DIAGNOSIS — Z7189 Other specified counseling: Secondary | ICD-10-CM

## 2020-08-29 DIAGNOSIS — K5900 Constipation, unspecified: Secondary | ICD-10-CM

## 2020-08-29 DIAGNOSIS — Z87440 Personal history of urinary (tract) infections: Secondary | ICD-10-CM

## 2020-08-29 DIAGNOSIS — Z419 Encounter for procedure for purposes other than remedying health state, unspecified: Secondary | ICD-10-CM

## 2020-08-29 DIAGNOSIS — E876 Hypokalemia: Secondary | ICD-10-CM | POA: Diagnosis not present

## 2020-08-29 DIAGNOSIS — Z7982 Long term (current) use of aspirin: Secondary | ICD-10-CM

## 2020-08-29 DIAGNOSIS — N400 Enlarged prostate without lower urinary tract symptoms: Secondary | ICD-10-CM | POA: Diagnosis present

## 2020-08-29 DIAGNOSIS — M5412 Radiculopathy, cervical region: Secondary | ICD-10-CM | POA: Diagnosis present

## 2020-08-29 DIAGNOSIS — Z9119 Patient's noncompliance with other medical treatment and regimen: Secondary | ICD-10-CM

## 2020-08-29 LAB — CBC WITH DIFFERENTIAL/PLATELET
Abs Immature Granulocytes: 0.02 10*3/uL (ref 0.00–0.07)
Basophils Absolute: 0 10*3/uL (ref 0.0–0.1)
Basophils Relative: 0 %
Eosinophils Absolute: 0 10*3/uL (ref 0.0–0.5)
Eosinophils Relative: 0 %
HCT: 38.5 % — ABNORMAL LOW (ref 39.0–52.0)
Hemoglobin: 11.9 g/dL — ABNORMAL LOW (ref 13.0–17.0)
Immature Granulocytes: 0 %
Lymphocytes Relative: 8 %
Lymphs Abs: 0.7 10*3/uL (ref 0.7–4.0)
MCH: 27.4 pg (ref 26.0–34.0)
MCHC: 30.9 g/dL (ref 30.0–36.0)
MCV: 88.7 fL (ref 80.0–100.0)
Monocytes Absolute: 0.5 10*3/uL (ref 0.1–1.0)
Monocytes Relative: 5 %
Neutro Abs: 8.3 10*3/uL — ABNORMAL HIGH (ref 1.7–7.7)
Neutrophils Relative %: 87 %
Platelets: 227 10*3/uL (ref 150–400)
RBC: 4.34 MIL/uL (ref 4.22–5.81)
RDW: 17.5 % — ABNORMAL HIGH (ref 11.5–15.5)
WBC: 9.6 10*3/uL (ref 4.0–10.5)
nRBC: 0 % (ref 0.0–0.2)

## 2020-08-29 LAB — COMPREHENSIVE METABOLIC PANEL
ALT: 13 U/L (ref 0–44)
AST: 16 U/L (ref 15–41)
Albumin: 2.9 g/dL — ABNORMAL LOW (ref 3.5–5.0)
Alkaline Phosphatase: 105 U/L (ref 38–126)
Anion gap: 8 (ref 5–15)
BUN: 16 mg/dL (ref 8–23)
CO2: 28 mmol/L (ref 22–32)
Calcium: 9.1 mg/dL (ref 8.9–10.3)
Chloride: 95 mmol/L — ABNORMAL LOW (ref 98–111)
Creatinine, Ser: 0.72 mg/dL (ref 0.61–1.24)
GFR, Estimated: >60 mL/min (ref >60)
Glucose, Bld: 115 mg/dL — ABNORMAL HIGH (ref 70–99)
Potassium: 4.2 mmol/L (ref 3.5–5.1)
Sodium: 131 mmol/L — ABNORMAL LOW (ref 135–145)
Total Bilirubin: 0.5 mg/dL (ref 0.3–1.2)
Total Protein: 7.5 g/dL (ref 6.5–8.1)

## 2020-08-29 LAB — SEDIMENTATION RATE: Sed Rate: 83 mm/hr — ABNORMAL HIGH (ref 0–16)

## 2020-08-29 LAB — RESP PANEL BY RT-PCR (FLU A&B, COVID) ARPGX2
Influenza A by PCR: NEGATIVE
Influenza B by PCR: NEGATIVE
SARS Coronavirus 2 by RT PCR: NEGATIVE

## 2020-08-29 LAB — LACTIC ACID, PLASMA
Lactic Acid, Venous: 2.8 mmol/L (ref 0.5–1.9)
Lactic Acid, Venous: 1.6 mmol/L (ref 0.5–1.9)

## 2020-08-29 LAB — C-REACTIVE PROTEIN: CRP: 6.2 mg/dL — ABNORMAL HIGH (ref <1.0)

## 2020-08-29 MED ORDER — SODIUM CHLORIDE 0.9 % IV BOLUS
1000.0000 mL | Freq: Once | INTRAVENOUS | Status: AC
Start: 2020-08-29 — End: 2020-08-29
  Administered 2020-08-29: 1000 mL via INTRAVENOUS

## 2020-08-29 MED ORDER — SODIUM CHLORIDE 0.9 % IV SOLN: INTRAVENOUS | Status: DC

## 2020-08-29 MED ORDER — GADOBUTROL 1 MMOL/ML IV SOLN
5.0000 mL | Freq: Once | INTRAVENOUS | Status: AC | PRN
  Administered 2020-08-29: 5 mL via INTRAVENOUS

## 2020-08-29 MED ORDER — GADOBUTROL 1 MMOL/ML IV SOLN
7.0000 mL | Freq: Once | INTRAVENOUS | Status: AC | PRN
  Administered 2020-08-29: 5 mL via INTRAVENOUS

## 2020-08-29 NOTE — H&P (Signed)
History and Physical    [name] PVV:[id number] DOB: [birth year] DOA: 08/24/2020  PCP: Jani Gravel, MD   Patient coming from: [facility]  I have personally briefly reviewed patient's old medical records in [place]  Chief Complaint: Bilateral lower extremity weakness, urinary retention, abnormal MRI  HPI: [name] is a 70 y.o. male with medical history significant for intellectual disability, diabetes mellitus, hypertension. Patient was sent to the ED from nursing home with reports of abnormal MRI.  History is mostly obtained from chart review as patient's is unable to communicate due to baseline intellectual disability. Over the past month, patient has been having bilateral lower extremity weakness, with urinary retention.  At the of my evaluation he shakes his head - No to any other complaints.  Patient had thoracic and lumbar MRI 08/25/20.  With lumbar MRI showing abnormal appearance of L3-L4 disc space with prominent edema in the L3-L4 vertebral bodies and possible small endplate erosions but no significant paravertebral soft tissue inflammation.  Findings indeterminate for infectious discitis-osteomyelitis versus prominent degenerative edema.  ED Course: Stable Vitals.  WBC 11.9.  Lactic acid 2.8.  Sodium 131.  1 L bolus given. EDP talked to neurosurgeon Dr. Christella Noa about MRI findings, recommended biopsy by IR, checking ESR and at this time this is nonsurgical.  Review of Systems: As per HPI all other systems reviewed and negative.  Past Medical History:  Diagnosis Date  . Diabetes mellitus without complication ([place])   . History of gout   . History of kidney stones   . Hypertension   . Mentally challenged   . Urinary retention     Past Surgical History:  Procedure Laterality Date  . CHOLECYSTECTOMY N/A 05/25/2019   Procedure: OPEN CHOLECYSTECTOMY;  Surgeon: Virl Cagey, MD;  Location: [facility];  Service: General;  Laterality: N/A;  .  CYSTOSCOPY W/ URETERAL STENT PLACEMENT Bilateral 06/20/2016   Procedure: CYSTOSCOPY WITH RETROGRADE PYELOGRAM/URETERAL STENT PLACEMENT;  Surgeon: Cleon Gustin, MD;  Location: [facility];  Service: Urology;  Laterality: Bilateral;  . CYSTOSCOPY WITH RETROGRADE PYELOGRAM, URETEROSCOPY AND STENT PLACEMENT Bilateral 08/20/2016   Procedure: CYSTOSCOPY WITH BILATERAL RETROGRADE PYELOGRAM, BILATERAL URETEROSCOPY,  LASER LITHOTRIPSY OF RIGHT URETERAL CALCULI, STONE BASKET EXTRACTION LEFT URETERAL CALCULI  AND BILATERAL URETERAL STENT EXCHANGE;  Surgeon: Cleon Gustin, MD;  Location: [facility];  Service: Urology;  Laterality: Bilateral;  . CYSTOSCOPY WITH RETROGRADE PYELOGRAM, URETEROSCOPY AND STENT PLACEMENT Left 01/14/2019   Procedure: CYSTOSCOPY WITH LEFT RETROGRADE PYELOGRAM, LEFT URETEROSCOPY AND LEFT URETERAL STENT PLACEMENT;  Surgeon: Cleon Gustin, MD;  Location: [facility];  Service: Urology;  Laterality: Left;  . CYSTOSCOPY WITH URETHRAL DILATATION  01/14/2019   Procedure: CYSTOSCOPY WITH URETHRAL DILATATION;  Surgeon: Cleon Gustin, MD;  Location: [facility];  Service: Urology;;  . HOLMIUM LASER APPLICATION Bilateral 10/19/4490   Procedure: HOLMIUM LASER APPLICATION;  Surgeon: Cleon Gustin, MD;  Location: [facility];  Service: Urology;  Laterality: Bilateral;  . HOLMIUM LASER APPLICATION Left 0/01/711   Procedure: HOLMIUM LASER APPLICATION;  Surgeon: Cleon Gustin, MD;  Location: [facility];  Service: Urology;  Laterality: Left;  . IR CATHETER TUBE CHANGE  07/04/2018  . IR EXCHANGE BILIARY DRAIN  06/09/2018  . IR EXCHANGE BILIARY DRAIN  10/20/2018  . IR EXCHANGE BILIARY DRAIN  01/12/2019  . IR EXCHANGE BILIARY DRAIN  04/07/2019  . IR PERC CHOLECYSTOSTOMY  05/24/2018  . LAPAROTOMY N/A 08/25/2018   Procedure: EXPLORATORY LAPAROTOMY reduction of volvulus;  Surgeon: Virl Cagey, MD;  Location: [facility];  Service: General;  Laterality: N/A;  . LAPAROTOMY N/A 05/25/2019   Procedure: EXPLORATORY  LAPAROTOMY;  Surgeon: Virl Cagey, MD;  Location: [facility];  Service: General;  Laterality: N/A;     reports that he has never smoked. He has never used smokeless tobacco. He reports that he does not drink alcohol and does not use drugs.  No Known Allergies  Family History  Problem Relation Age of Onset  . Gallbladder disease Other     Prior to Admission medications   Medication Sig Start Date End Date Taking? Authorizing Provider  acetaminophen (TYLENOL) 500 MG tablet Take 650 mg by mouth every 6 (six) hours as needed for mild pain or headache.    [provider]  aspirin EC 81 MG tablet Take 1 tablet (81 mg total) by mouth daily with breakfast. 05/29/19   Denton Brick, Courage, MD  atorvastatin (LIPITOR) 20 MG tablet Take 20 mg by mouth at bedtime. 06/09/19   [provider]  bisacodyl (DULCOLAX) 10 MG suppository Place 1 suppository (10 mg total) rectally daily. Patient not taking: Reported on 07/30/2020 03/03/20   TatShanon Brow, MD  FEROSUL 325 (65 Fe) MG tablet Take 325 mg by mouth daily. Patient not taking: No sig reported 04/28/20   [provider]  finasteride (PROSCAR) 5 MG tablet Take 1 tablet (5 mg total) by mouth daily. 05/30/20   McKenzie, Candee Furbish, MD  linaclotide [facility]) 145 MCG CAPS capsule Take 145 mcg by mouth daily before breakfast. Patient not taking: Reported on 07/30/2020    [provider]  magnesium oxide (MAG-OX) 400 MG tablet Take 1 tablet (400 mg total) by mouth daily. 10/23/18   Gerlene Fee, NP  metFORMIN (GLUCOPHAGE) 1000 MG tablet Take 1,000 mg by mouth 2 (two) times daily. 04/27/20   [provider]  nitrofurantoin (MACRODANTIN) 50 MG capsule Take 1 capsule (50 mg total) by mouth at bedtime. Patient not taking: Reported on 07/30/2020 05/30/20   Cleon Gustin, MD  Potassium Chloride ER 20 MEQ TBCR Take 20 mEq by mouth every Tuesday, Thursday, Saturday, and Sunday. 1 tab daily by mouth Patient not taking: Reported  on 07/30/2020 05/30/19   Roxan Hockey, MD  simethicone (MYLICON) 40 OV/2.9VB drops Take 1.2 mLs (80 mg total) by mouth 4 (four) times daily as needed for flatulence. Patient not taking: No sig reported 04/05/20   Rogene Houston, MD  tamsulosin (FLOMAX) 0.4 MG CAPS capsule Take 1 capsule (0.4 mg total) by mouth in the morning and at bedtime. Patient not taking: Reported on 07/30/2020 05/30/20   Cleon Gustin, MD  vancomycin (VANCOCIN) 125 MG capsule Take 125 mg by mouth every 6 (six) hours. Patient not taking: No sig reported 06/25/20   [provider]    Physical Exam: Limited by patient's speech abnormalities Vitals:   08/28/2020 1630 08/30/2020 1700 08/28/2020 1730 08/28/2020 1800  BP: (!) 105/56 125/73 124/63 124/79  Pulse: (!) 119 97 89 100  Resp: _0 Temp:      TempSrc:      SpO2: 98% 97% 98% 96%  Weight:      Height:        Constitutional: NAD, calm, comfortable Vitals:   09/11/2020 1630 08/26/2020 1700 08/28/2020 1730 09/06/2020 1800  BP: (!) 105/56 125/73 124/63 124/79  Pulse: (!) 119 97 89 100  Resp: _1 Temp:      TempSrc:  SpO2: 98% 97% 98% 96%  Weight:      Height:       Eyes: PERRL, lids and conjunctivae normal ENMT: Mucous membranes are moist.  Neck: normal, supple, no masses, no thyromegaly Respiratory: clear to auscultation bilaterally, no wheezing, no crackles. Normal respiratory effort. No accessory muscle use.  Cardiovascular: Regular rate and rhythm, no murmurs / rubs / gallops. No extremity edema. 2+ pedal pulses.  Abdomen: no tenderness, no masses palpated. No hepatosplenomegaly. Bowel sounds positive.  Musculoskeletal: no clubbing / cyanosis. No joint deformity upper and lower extremities. Good ROM, no contractures. Normal muscle tone.  Skin: no rashes, lesions, ulcers. No induration Neurologic: 5 out of 5 strength bilateral upper extremities, 4/5 bilaterally, barely able to move bilateral lower extremity against gravity, ureteral  catheter Psychiatric: Unable to assess due to speech abnormalities, intellectual disabilities  Labs on Admission: I have personally reviewed following labs and imaging studies  CBC: Recent Labs  Lab 09/12/2020 1603  WBC 9.6  NEUTROABS 8.3*  HGB 11.9*  HCT 38.5*  MCV 88.7  PLT 627   Basic Metabolic Panel: Recent Labs  Lab 08/30/2020 1603  NA 131*  K 4.2  CL 95*  CO2 28  GLUCOSE 115*  BUN 16  CREATININE 0.72  CALCIUM 9.1   Estimated Creatinine Clearance: 88.2 mL/min (by C-G formula based on SCr of 0.72 mg/dL). Liver Function Tests: Recent Labs  Lab 09/02/2020 1603  AST 16  ALT 13  ALKPHOS 105  BILITOT 0.5  PROT 7.5  ALBUMIN 2.9*    Radiological Exams on Admission: No results found.  EKG: None   Assessment/Plan Principal Problem:   Lower extremity weakness Active Problems:   Urine retention   Intellectual disability   HTN (hypertension)   Diabetes mellitus type 2, noninsulin dependent (HCC)   Nonverbal   Bilateral lower extremity weakness with urinary retention- MRI lumbar spine W Wo contrast showing prominent L3-L4 disc space edema findings indeterminate for infectious discitis/osteomyelitis versus degenerative edema. - EDP talked to neurosurgeon Dr. Christella Noa, recommended IR biopsy, - ESR- 83 , CRP- 6.2  -N.p.o. midnight -Please consult IR for biopsy in the morning.  -Antibiotics held for now  -Follow blood cultures obtained in ED - Continue foley cath, started prior to admission  Lactic acidosis-2.8.  Likely from dehydration.  At this time patient does not meet sepsis criteria. -Trend lactic acid - IVF N/s 100cc/hr x 15 hrs  DM- random glucose 115 - Hgba1c - SSI- S -Hold home metformin  HTN- stable. -Resume tamsulosin  DVT prophylaxis: SCDs Code Status: Full Code Family Communication: None at bedside Disposition Plan: ~/> 2 days Consults called: Please consult IR in a.m Admission status: Inpt, med surg I certify that at the point of  admission it is my clinical judgment that the patient will require inpatient hospital care spanning beyond 2 midnights from the point of admission due to high intensity of service, high risk for further deterioration and high frequency of surveillance required. The following factors support the patient status of inpatient:    Bethena Roys MD [facility]  08/27/2020, 10:32 PM

## 2020-08-29 NOTE — ED Provider Notes (Signed)
[facility] EMERGENCY DEPARTMENT Provider Note   CSN: [id number] Arrival date & time: 08/28/2020  1436     History Chief Complaint  Patient presents with  . Abnormal MRI    [name] is a 70 y.o. male.  HPI Patient is a 70 year old male with a history of diabetes mellitus, hypertension, urinary retention, mentally challenged, who presents to the emergency department from [place] in [place] due to an abnormal MRI of the lumbar spine.  Per records, patient is nonverbal at baseline.  Patient has been having weakness in the lower extremities as well as urinary retention.  MRI was ordered of the brain, thoracic spine, cervical spine, as well as the lumbar spine.  Findings as noted below.  Due to these findings patient was sent to the emergency department.    I have attempted to reach out to [facility] staff x2 without success.  I spoke to patient's aunt.  She states that he has had weakness and urinary retention for the past month which was why he was initially sent to [place].  She was told this morning that his MRI was abnormal and then he was sent to the emergency department.  1. Abnormal appearance of the L3-4 disc space with prominent edema in the L3 and L4 vertebral bodies and possible small endplate erosions but no significant paravertebral soft tissue inflammation. Findings are indeterminate for infectious discitis-osteomyelitis versus prominent degenerative edema. Correlate for clinical and laboratory evidence of infection. 2. Congenitally short pedicles with superimposed disc and facet degeneration resulting in severe spinal stenosis at L3-4 and L4-5 and moderate spinal stenosis at L1-2. 3. Severe neural foraminal stenosis at L3-4 and L5-S1.    Past Medical History:  Diagnosis Date  . Diabetes mellitus without complication ([place])   . History of gout   . History of kidney stones   . Hypertension   . Mentally challenged   . Urinary retention      Patient Active Problem List   Diagnosis Date Noted  . SIRS (systemic inflammatory response syndrome) ([place]) 06/15/2020  . Goals of care, counseling/discussion   . Encounter for hospice care discussion   . COVID-19 virus infection 02/28/2020  . Palliative care by specialist   . Pneumoperitoneum 07/04/2019  . Intra-abdominal free air of unknown etiology 07/04/2019  . Cystitis   . Chronic cholecystitis 05/21/2019  . Chronic constipation 03/31/2019  . Cholecystostomy care ([place]) 11/04/2018  . Acute lower UTI 09/18/2018  . Abdominal distention 09/18/2018  . Protein-calorie malnutrition, severe ([place]) 09/09/2018  . Acute blood loss anemia 09/09/2018  . Gangrenous cholecystitis 09/05/2018  . Pneumatosis intestinalis   . Gall bladder disease 08/25/2018  . Small bowel ischemia ([place])   . Acute gangrenous cholecystitis 05/24/2018  . SBO (small bowel obstruction) ([place]) 05/24/2018  . Abdominal distension   . Pressure injury of skin 04/09/2018  . C. difficile diarrhea 04/08/2018  . Bilateral hydronephrosis 04/08/2018  . Pneumoperitoneum of unknown etiology   . Diarrhea 04/07/2018  . Nonverbal 04/07/2018  . Hydronephrosis with urinary obstruction due to ureteral calculus 06/20/2016  . Sepsis secondary to UTI ([place]) 06/20/2016  . Fever 06/19/2016  . Generalized weakness 06/19/2016  . Diabetes mellitus type 2, noninsulin dependent ([place]) 06/19/2016  . Severe sepsis with septic shock ([place]) 06/19/2016  . UTI (urinary tract infection) 06/19/2016  . Hematuria 06/19/2016  . AKI (acute kidney injury) ([place]) 06/19/2016  . Hyponatremia 06/19/2016  . Hypokalemia 06/19/2016  . BPH (benign prostatic hyperplasia) 06/19/2016  . Urine  retention 05/07/2014  . Intellectual disability 05/07/2014  . Hyperlipidemia 05/07/2014  . HTN (hypertension) 05/07/2014  . Renal calculus 01/14/2012    Past Surgical History:  Procedure Laterality Date  . CHOLECYSTECTOMY N/A 05/25/2019   Procedure: OPEN  CHOLECYSTECTOMY;  Surgeon: Virl Cagey, MD;  Location: [facility];  Service: General;  Laterality: N/A;  . CYSTOSCOPY W/ URETERAL STENT PLACEMENT Bilateral 06/20/2016   Procedure: CYSTOSCOPY WITH RETROGRADE PYELOGRAM/URETERAL STENT PLACEMENT;  Surgeon: Cleon Gustin, MD;  Location: [facility];  Service: Urology;  Laterality: Bilateral;  . CYSTOSCOPY WITH RETROGRADE PYELOGRAM, URETEROSCOPY AND STENT PLACEMENT Bilateral 08/20/2016   Procedure: CYSTOSCOPY WITH BILATERAL RETROGRADE PYELOGRAM, BILATERAL URETEROSCOPY,  LASER LITHOTRIPSY OF RIGHT URETERAL CALCULI, STONE BASKET EXTRACTION LEFT URETERAL CALCULI  AND BILATERAL URETERAL STENT EXCHANGE;  Surgeon: Cleon Gustin, MD;  Location: [facility];  Service: Urology;  Laterality: Bilateral;  . CYSTOSCOPY WITH RETROGRADE PYELOGRAM, URETEROSCOPY AND STENT PLACEMENT Left 01/14/2019   Procedure: CYSTOSCOPY WITH LEFT RETROGRADE PYELOGRAM, LEFT URETEROSCOPY AND LEFT URETERAL STENT PLACEMENT;  Surgeon: Cleon Gustin, MD;  Location: [facility];  Service: Urology;  Laterality: Left;  . CYSTOSCOPY WITH URETHRAL DILATATION  01/14/2019   Procedure: CYSTOSCOPY WITH URETHRAL DILATATION;  Surgeon: Cleon Gustin, MD;  Location: [facility];  Service: Urology;;  . HOLMIUM LASER APPLICATION Bilateral 11/15/4479   Procedure: HOLMIUM LASER APPLICATION;  Surgeon: Cleon Gustin, MD;  Location: [facility];  Service: Urology;  Laterality: Bilateral;  . HOLMIUM LASER APPLICATION Left 8/56/3149   Procedure: HOLMIUM LASER APPLICATION;  Surgeon: Cleon Gustin, MD;  Location: [facility];  Service: Urology;  Laterality: Left;  . IR CATHETER TUBE CHANGE  07/04/2018  . IR EXCHANGE BILIARY DRAIN  06/09/2018  . IR EXCHANGE BILIARY DRAIN  10/20/2018  . IR EXCHANGE BILIARY DRAIN  01/12/2019  . IR EXCHANGE BILIARY DRAIN  04/07/2019  . IR PERC CHOLECYSTOSTOMY  05/24/2018  . LAPAROTOMY N/A 08/25/2018   Procedure: EXPLORATORY LAPAROTOMY reduction of volvulus;  Surgeon: Virl Cagey, MD;   Location: [facility];  Service: General;  Laterality: N/A;  . LAPAROTOMY N/A 05/25/2019   Procedure: EXPLORATORY LAPAROTOMY;  Surgeon: Virl Cagey, MD;  Location: [facility];  Service: General;  Laterality: N/A;       Family History  Problem Relation Age of Onset  . Gallbladder disease Other     Social History   Tobacco Use  . Smoking status: Never Smoker  . Smokeless tobacco: Never Used  Vaping Use  . Vaping Use: Never used  Substance Use Topics  . Alcohol use: No  . Drug use: No    Home Medications Prior to Admission medications   Medication Sig Start Date End Date Taking? Authorizing Provider  acetaminophen (TYLENOL) 500 MG tablet Take 650 mg by mouth every 6 (six) hours as needed for mild pain or headache.    [provider]  aspirin EC 81 MG tablet Take 1 tablet (81 mg total) by mouth daily with breakfast. 05/29/19   Denton Brick, [place], MD  atorvastatin (LIPITOR) 20 MG tablet Take 20 mg by mouth at bedtime. 06/09/19   [provider]  bisacodyl (DULCOLAX) 10 MG suppository Place 1 suppository (10 mg total) rectally daily. Patient not taking: Reported on 07/30/2020 03/03/20   TatShanon Brow, MD  FEROSUL 325 (65 Fe) MG tablet Take 325 mg by mouth daily. Patient not taking: No sig reported 04/28/20   [provider]  finasteride (PROSCAR) 5 MG tablet Take 1 tablet (5 mg total) by mouth  daily. 05/30/20   Cleon Gustin, MD  linaclotide [facility]) 145 MCG CAPS capsule Take 145 mcg by mouth daily before breakfast. Patient not taking: Reported on 07/30/2020    [provider]  magnesium oxide (MAG-OX) 400 MG tablet Take 1 tablet (400 mg total) by mouth daily. 10/23/18   Gerlene Fee, NP  metFORMIN (GLUCOPHAGE) 1000 MG tablet Take 1,000 mg by mouth 2 (two) times daily. 04/27/20   [provider]  nitrofurantoin (MACRODANTIN) 50 MG capsule Take 1 capsule (50 mg total) by mouth at bedtime. Patient not taking: Reported on 07/30/2020 05/30/20    Cleon Gustin, MD  Potassium Chloride ER 20 MEQ TBCR Take 20 mEq by mouth every Tuesday, Thursday, Saturday, and Sunday. 1 tab daily by mouth Patient not taking: Reported on 07/30/2020 05/30/19   Roxan Hockey, MD  simethicone (MYLICON) 40 XV/4.0GQ drops Take 1.2 mLs (80 mg total) by mouth 4 (four) times daily as needed for flatulence. Patient not taking: No sig reported 04/05/20   Rogene Houston, MD  tamsulosin (FLOMAX) 0.4 MG CAPS capsule Take 1 capsule (0.4 mg total) by mouth in the morning and at bedtime. Patient not taking: Reported on 07/30/2020 05/30/20   Cleon Gustin, MD  vancomycin (VANCOCIN) 125 MG capsule Take 125 mg by mouth every 6 (six) hours. Patient not taking: No sig reported 06/25/20   [provider]    Allergies    Patient has no known allergies.  Review of Systems   Review of Systems  Unable to perform ROS: Patient nonverbal    Physical Exam Updated Vital Signs BP 124/79   Pulse 100   Temp 98.4 F (36.9 C) (Oral)   Resp 11   Ht 6' (1.829 m)   Wt 72.6 kg   SpO2 96%   BMI 21.70 kg/m   Physical Exam Vitals and nursing note reviewed.  Constitutional:      General: He is not in acute distress.    Appearance: Normal appearance. He is not ill-appearing, toxic-appearing or diaphoretic.  HENT:     Head: Normocephalic and atraumatic.     Right Ear: External ear normal.     Left Ear: External ear normal.     Nose: Nose normal.     Mouth/Throat:     Mouth: Mucous membranes are moist.     Pharynx: Oropharynx is clear. No oropharyngeal exudate or posterior oropharyngeal erythema.  Eyes:     Extraocular Movements: Extraocular movements intact.  Cardiovascular:     Rate and Rhythm: Normal rate and regular rhythm.     Pulses: Normal pulses.     Heart sounds: Normal heart sounds. No murmur heard. No friction rub. No gallop.   Pulmonary:     Effort: Pulmonary effort is normal. No respiratory distress.     Breath sounds: Normal breath  sounds. No stridor. No wheezing, rhonchi or rales.  Abdominal:     General: Abdomen is flat.     Palpations: Abdomen is soft.     Tenderness: There is no abdominal tenderness.  Musculoskeletal:        General: Tenderness present. Normal range of motion.     Cervical back: Normal range of motion and neck supple. No tenderness.     Comments: Patient nods his head "yes" when asking if there is pain when palpating his midline lumbar region. No other pain noted when palpating the thoracic spine and cervical spine.   Skin:    General: Skin is warm and dry.  Neurological:     Comments: Patient nonverbal at baseline.  Appears to be able to move all 4 extremities but does have more difficulty with the lower extremities. Follows commands but still difficult to assess his strength in the legs.   Psychiatric:        Mood and Affect: Mood normal.        Behavior: Behavior normal.    ED Results / Procedures / Treatments   Labs (all labs ordered are listed, but only abnormal results are displayed) Labs Reviewed  COMPREHENSIVE METABOLIC PANEL - Abnormal; Notable for the following components:      Result Value   Sodium 131 (*)    Chloride 95 (*)    Glucose, Bld 115 (*)    Albumin 2.9 (*)    All other components within normal limits  LACTIC ACID, PLASMA - Abnormal; Notable for the following components:   Lactic Acid, Venous 2.8 (*)    All other components within normal limits  CBC WITH DIFFERENTIAL/PLATELET - Abnormal; Notable for the following components:   Hemoglobin 11.9 (*)    HCT 38.5 (*)    RDW 17.5 (*)    Neutro Abs 8.3 (*)    All other components within normal limits  CULTURE, BLOOD (ROUTINE X 2)  CULTURE, BLOOD (ROUTINE X 2)  RESP PANEL BY RT-PCR (FLU A&B, COVID) ARPGX2  LACTIC ACID, PLASMA  URINALYSIS, ROUTINE W REFLEX MICROSCOPIC  SEDIMENTATION RATE   EKG None  Radiology No results found.  Procedures Procedures   Medications Ordered in ED Medications  sodium chloride  0.9 % bolus 1,000 mL (1,000 mLs Intravenous New Bag/Given 09/07/2020 1725)   ED Course  I have reviewed the triage vital signs and the nursing notes.  Pertinent labs & imaging results that were available during my care of the patient were reviewed by me and considered in my medical decision making (see chart for details).  Clinical Course as of 09/11/2020 1809  Mon Aug 29, 2020  1709 Lactic Acid, Venous(!!): 2.8 [LJ]  1804 Patient discussed with Dr. Christella Noa with neurosurgery.  He states that patient will need a medicine admission and his vertebral body will need to be biopsied by IR.  Feels that this is nonsurgical at this point.  Also recommended that I obtain an ESR. [LJ]    Clinical Course User Index [LJ] Rayna Sexton, PA-C   MDM Rules/Calculators/A&P                          Pt is a 70 y.o. male who presents to the emergency department from [facility] in [place] due to edema versus possible osteomyelitis/discitis in the lumbar spine.  Labs: CBC with a hemoglobin of 11.9, hematocrit of 38.5, RDW of 17.5, neutrophils of 8.3. CMP with a sodium of 131, chloride of 95, glucose of 115, BUN of 2.9. Lactic acid of 2.8. ESR pending. Respiratory panel pending.  I, Rayna Sexton, PA-C, personally reviewed and evaluated these images and lab results as part of my medical decision-making.  Patient an elevated lactic acid as well as neutrophil count.  Possible infectious etiology.  Patient discussed with Dr. Christella Noa with neurosurgery.  States that patient would need a medicine admission as well as IR for biopsy of the region.  Will discuss with the medicine team.  Note: Portions of this report may have been transcribed using voice recognition software. Every effort was made to ensure accuracy; however, inadvertent computerized transcription errors may be present.  Final Clinical Impression(s) / ED Diagnoses Final diagnoses:  Weakness of both lower extremities   Rx / DC Orders ED  Discharge Orders    None       Rayna Sexton, PA-C 09/04/2020 1809    Daleen Bo, MD 08/30/20 0001

## 2020-08-29 NOTE — ED Triage Notes (Signed)
Pt presents from [place] via [facility]. Had MRI on 08/25/20, results came back to day showing prominent edema on L3 and L4.

## 2020-08-29 NOTE — ED Notes (Signed)
Date and time results received: 08/14/2020 1703   Test: Lactic acid Critical Value: 2.8  Name of Provider Notified: Whitney Post PA  Orders Received? Or Actions Taken? See orders

## 2020-08-29 NOTE — ED Notes (Signed)
Pt unable to sign MSE due to medical history.

## 2020-08-30 ENCOUNTER — Encounter (HOSPITAL_COMMUNITY): Payer: Self-pay | Admitting: Internal Medicine

## 2020-08-30 DIAGNOSIS — E119 Type 2 diabetes mellitus without complications: Secondary | ICD-10-CM | POA: Diagnosis not present

## 2020-08-30 DIAGNOSIS — R29898 Other symptoms and signs involving the musculoskeletal system: Secondary | ICD-10-CM | POA: Diagnosis not present

## 2020-08-30 DIAGNOSIS — R339 Retention of urine, unspecified: Secondary | ICD-10-CM | POA: Diagnosis not present

## 2020-08-30 DIAGNOSIS — I10 Essential (primary) hypertension: Secondary | ICD-10-CM | POA: Diagnosis not present

## 2020-08-30 LAB — BASIC METABOLIC PANEL
Anion gap: 7 (ref 5–15)
BUN: 12 mg/dL (ref 8–23)
CO2: 27 mmol/L (ref 22–32)
Calcium: 8.5 mg/dL — ABNORMAL LOW (ref 8.9–10.3)
Chloride: 101 mmol/L (ref 98–111)
Creatinine, Ser: 0.58 mg/dL — ABNORMAL LOW (ref 0.61–1.24)
GFR, Estimated: >60 mL/min (ref >60)
Glucose, Bld: 85 mg/dL (ref 70–99)
Potassium: 4.1 mmol/L (ref 3.5–5.1)
Sodium: 135 mmol/L (ref 135–145)

## 2020-08-30 LAB — CBC
HCT: 36.5 % — ABNORMAL LOW (ref 39.0–52.0)
Hemoglobin: 11.1 g/dL — ABNORMAL LOW (ref 13.0–17.0)
MCH: 27.2 pg (ref 26.0–34.0)
MCHC: 30.4 g/dL (ref 30.0–36.0)
MCV: 89.5 fL (ref 80.0–100.0)
Platelets: 339 10*3/uL (ref 150–400)
RBC: 4.08 MIL/uL — ABNORMAL LOW (ref 4.22–5.81)
RDW: 17.5 % — ABNORMAL HIGH (ref 11.5–15.5)
WBC: 7.2 10*3/uL (ref 4.0–10.5)
nRBC: 0 % (ref 0.0–0.2)

## 2020-08-30 LAB — HIV ANTIBODY (ROUTINE TESTING W REFLEX): HIV Screen 4th Generation wRfx: NONREACTIVE

## 2020-08-30 LAB — GLUCOSE, CAPILLARY
Glucose-Capillary: 100 mg/dL — ABNORMAL HIGH (ref 70–99)
Glucose-Capillary: 75 mg/dL (ref 70–99)
Glucose-Capillary: 77 mg/dL (ref 70–99)
Glucose-Capillary: 77 mg/dL (ref 70–99)
Glucose-Capillary: 81 mg/dL (ref 70–99)
Glucose-Capillary: 81 mg/dL (ref 70–99)

## 2020-08-30 LAB — HEMOGLOBIN A1C
Hgb A1c MFr Bld: 7.1 % — ABNORMAL HIGH (ref 4.8–5.6)
Mean Plasma Glucose: 157.07 mg/dL

## 2020-08-30 LAB — PROTIME-INR
INR: 1.1 (ref 0.8–1.2)
Prothrombin Time: 13.9 seconds (ref 11.4–15.2)

## 2020-08-30 MED ORDER — ONDANSETRON HCL 4 MG PO TABS: 4.0000 mg | ORAL_TABLET | Freq: Four times a day (QID) | ORAL | Status: DC | PRN

## 2020-08-30 MED ORDER — ACETAMINOPHEN 650 MG RE SUPP: 650.0000 mg | Freq: Four times a day (QID) | RECTAL | Status: DC | PRN

## 2020-08-30 MED ORDER — ATORVASTATIN CALCIUM 10 MG PO TABS
20.0000 mg | ORAL_TABLET | Freq: Every day | ORAL | Status: DC
  Administered 2020-08-30 – 2020-09-06 (×6): 20 mg via ORAL
  Filled 2020-08-30 (×7): qty 2

## 2020-08-30 MED ORDER — ACETAMINOPHEN 325 MG PO TABS: 650.0000 mg | ORAL_TABLET | Freq: Four times a day (QID) | ORAL | Status: DC | PRN

## 2020-08-30 MED ORDER — SENNA 8.6 MG PO TABS
2.0000 | ORAL_TABLET | Freq: Two times a day (BID) | ORAL | Status: DC
  Administered 2020-08-30 – 2020-09-07 (×10): 17.2 mg via ORAL
  Filled 2020-08-30 (×12): qty 2

## 2020-08-30 MED ORDER — DOCUSATE SODIUM 100 MG PO CAPS
100.0000 mg | ORAL_CAPSULE | Freq: Every day | ORAL | Status: DC
  Administered 2020-09-07: 100 mg via ORAL
  Filled 2020-08-30 (×5): qty 1

## 2020-08-30 MED ORDER — INSULIN ASPART 100 UNIT/ML IJ SOLN
0.0000 [IU] | Freq: Four times a day (QID) | INTRAMUSCULAR | Status: DC
  Administered 2020-09-01: 2 [IU] via SUBCUTANEOUS
  Administered 2020-09-02 (×2): 1 [IU] via SUBCUTANEOUS
  Administered 2020-09-05: 2 [IU] via SUBCUTANEOUS
  Administered 2020-09-05 – 2020-09-06 (×5): 1 [IU] via SUBCUTANEOUS
  Administered 2020-09-06: 2 [IU] via SUBCUTANEOUS
  Administered 2020-09-07 (×2): 3 [IU] via SUBCUTANEOUS
  Administered 2020-09-07 – 2020-09-08 (×3): 2 [IU] via SUBCUTANEOUS
  Administered 2020-09-08: 3 [IU] via SUBCUTANEOUS

## 2020-08-30 MED ORDER — MAGNESIUM OXIDE -MG SUPPLEMENT 400 (240 MG) MG PO TABS
400.0000 mg | ORAL_TABLET | Freq: Every day | ORAL | Status: DC
  Administered 2020-09-02 – 2020-09-08 (×5): 400 mg via ORAL
  Filled 2020-08-30 (×7): qty 1

## 2020-08-30 MED ORDER — ONDANSETRON HCL 4 MG/2ML IJ SOLN
4.0000 mg | Freq: Four times a day (QID) | INTRAMUSCULAR | Status: DC | PRN
  Administered 2020-09-01: 4 mg via INTRAVENOUS

## 2020-08-30 MED ORDER — CHLORHEXIDINE GLUCONATE CLOTH 2 % EX PADS
6.0000 | MEDICATED_PAD | Freq: Every day | CUTANEOUS | Status: DC
  Administered 2020-08-30 – 2020-09-07 (×7): 6 via TOPICAL

## 2020-08-30 MED ORDER — FINASTERIDE 5 MG PO TABS
5.0000 mg | ORAL_TABLET | Freq: Every day | ORAL | Status: DC
  Administered 2020-09-02 – 2020-09-08 (×5): 5 mg via ORAL
  Filled 2020-08-30 (×7): qty 1

## 2020-08-30 MED ORDER — TAMSULOSIN HCL 0.4 MG PO CAPS
0.4000 mg | ORAL_CAPSULE | Freq: Every day | ORAL | Status: DC
  Administered 2020-09-02 – 2020-09-08 (×5): 0.4 mg via ORAL
  Filled 2020-08-30 (×7): qty 1

## 2020-08-30 NOTE — Progress Notes (Addendum)
PROGRESS NOTE    Levi Shinelbert S Carreira  KVQ:[id number] DOB: [birth year] DOA: 09/12/2020 PCP: [name], [name], MD   Brief Narrative: [name] is a 70 y.o. male with a history of intellectual disability, diabetes mellitus.  Patient presented from a nursing home secondary bilateral lower extremity weakness, urinary retention and abnormal MRI.  Patient was found to have an L3-L4 disc space edema concerning for possible discitis/osteomyelitis versus degenerative edema.  Neurosurgery was consulted on admission who recommended IR consult for aspiration.  Blood cultures obtained on admission.  Antibiotics held secondary to stable patient.   Assessment & Plan:   Principal Problem:   Lower extremity weakness Active Problems:   Urine retention   Intellectual disability   HTN (hypertension)   Diabetes mellitus type 2, noninsulin dependent (HCC)   Nonverbal   Bilateral LE weakness Associated urinary retention. MRI lumbar spine significant for L3-4 disc space edema, ?infectious discitis/osteomyelitis vs degenerative edema. Neurosurgery, Dr. Franky Machoabbell consulted on admission who recommended IR biopsy. Antibiotics held on admission. -IR consulted for lumbar aspiration -Follow-up blood cultures  Urinary retention In setting of above but patient also has a history of BPH and chronic Foley use.  Patient presented to the hospital with a Foley catheter already in place.  Lactic acidosis Mild. Patient given IV fluids. Resolved.  Diabetes mellitus, type 2 Hemoglobin A1C of 7.1%. On metformin as an outpatient. Started on SSI inpatient. -Continue SSI  Hyperlipidemia -Continue Lipitor  BPH Patient is on Flomax and finasteride as an outpatient -Continue finasteride and Flomax  Intellectual disability Non-verbal.   Addendum: MRI cervical spine resulted with evidence of severe spinal stenosis at C3-4/C4-5 with cord compression. -Consult neurosurgery   DVT prophylaxis: SCDs Code Status:   Code  Status: Full Code Family Communication: None at bedside. Aunt on telephone Disposition Plan: Discharge likely in several days pending continued workup of possible lumbar spine infection; home vs SNF pending PT   Consultants:   Neurosurgery  Interventional radiology  Procedures:   None  Antimicrobials:  None    Subjective: Non-verbal.   Objective: Vitals:   08/30/20 0100 08/30/20 0201 08/30/20 0750 08/30/20 1120  BP: 113/73 124/70 139/78 (!) 105/59  Pulse: 81 79 99 91  Resp: 13 15 16 17   Temp:  (!) 97.2 F (36.2 C) 97.8 F (36.6 C) 98 F (36.7 C)  TempSrc:  Oral Oral Oral  SpO2: 98% 100% 99% 100%  Weight:  53.9 kg    Height:  6' 0.01" (1.829 m)      Intake/Output Summary (Last 24 hours) at 08/30/2020 1354 Last data filed at 08/30/2020 0300 Gross per 24 hour  Intake 1000 ml  Output 500 ml  Net 500 ml   Filed Weights   09/07/2020 1451 08/30/20 0201  Weight: 72.6 kg 53.9 kg    Examination:  General exam: Appears calm and comfortable Respiratory system: Clear to auscultation. Respiratory effort normal. Cardiovascular system: S1 & S2 heard, RRR. No murmurs, rubs, gallops or clicks. Gastrointestinal system: Abdomen is nondistended, soft and nontender. No organomegaly or masses felt. Normal bowel sounds heard. Central nervous system: Alert. Follows commands. BLE significantly weak. BUE about 4/5 bilaterally Musculoskeletal: No edema. No calf tenderness Skin: No cyanosis. No rashes   Data Reviewed: I have personally reviewed following labs and imaging studies  CBC Lab Results  Component Value Date   WBC 7.2 08/30/2020   RBC 4.08 (L) 08/30/2020   HGB 11.1 (L) 08/30/2020   HCT 36.5 (L) 08/30/2020   MCV 89.5 08/30/2020  MCH 27.2 08/30/2020   PLT 339 08/30/2020   MCHC 30.4 08/30/2020   RDW 17.5 (H) 08/30/2020   LYMPHSABS 0.7 08/30/2020   MONOABS 0.5 08/23/2020   EOSABS 0.0 09/07/2020   BASOSABS 0.0 09/06/2020     Last metabolic panel Lab Results   Component Value Date   NA 135 08/30/2020   K 4.1 08/30/2020   CL 101 08/30/2020   CO2 27 08/30/2020   BUN 12 08/30/2020   CREATININE 0.58 (L) 08/30/2020   GLUCOSE 85 08/30/2020   GFRNONAA >60 08/30/2020   GFRAA >60 07/07/2019   CALCIUM 8.5 (L) 08/30/2020   PHOS 2.6 03/03/2020   PROT 7.5 08/28/2020   ALBUMIN 2.9 (L) 08/17/2020   BILITOT 0.5 08/27/2020   ALKPHOS 105 09/13/2020   AST 16 08/14/2020   ALT 13 08/26/2020   ANIONGAP 7 08/30/2020    CBG (last 3)  Recent Labs    08/30/20 0629 08/30/20 0746 08/30/20 1149  GLUCAP 77 81 75     GFR: Estimated Creatinine Clearance: 65.5 mL/min (A) (by C-G formula based on SCr of 0.58 mg/dL (L)).  Coagulation Profile: Recent Labs  Lab 08/30/20 1031  INR 1.1    Recent Results (from the past 240 hour(s))  Culture, blood (routine x 2)     Status: None (Preliminary result)   Collection Time: 08/28/2020  4:03 PM   Specimen: BLOOD RIGHT ARM  Result Value Ref Range Status   Specimen Description   Final    BLOOD RIGHT ARM Performed at [facility], [address] [place], [place] [address]    Special Requests   Final    BOTTLES DRAWN AEROBIC AND ANAEROBIC Blood Culture adequate volume Performed at [facility], [address], [place], [place] [address]    Culture   Final    NO GROWTH < 24 HOURS Performed at [facility], [address] [place], [place] [address]    Report Status PENDING  Incomplete  Culture, blood (routine x 2)     Status: None (Preliminary result)   Collection Time: 09/05/2020  4:03 PM   Specimen: BLOOD RIGHT HAND  Result Value Ref Range Status   Specimen Description   Final    BLOOD RIGHT HAND Performed at [facility], [address], [place], [place] [address]    Special Requests   Final    BOTTLES DRAWN AEROBIC AND ANAEROBIC Blood Culture adequate volume Performed at [facility], [address] [place], [place] [address]    Culture   Final    NO GROWTH < 24  HOURS Performed at [facility], [address], [place], [place] [address]    Report Status PENDING  Incomplete  Resp Panel by RT-PCR (Flu A&B, Covid) Nasopharyngeal Swab     Status: None   Collection Time: 09/04/2020  6:05 PM   Specimen: Nasopharyngeal Swab; Nasopharyngeal(NP) swabs in vial transport medium  Result Value Ref Range Status   SARS Coronavirus 2 by RT PCR NEGATIVE NEGATIVE Final    Comment: (NOTE) SARS-CoV-2 target nucleic acids are NOT DETECTED.  The SARS-CoV-2 RNA is generally detectable in upper respiratory specimens during the acute phase of infection. The lowest concentration of SARS-CoV-2 viral copies this assay can detect is 138 copies/mL. A negative result does not preclude SARS-Cov-2 infection and should not be used as the sole basis for treatment or other patient management decisions. A negative result may occur with  improper specimen collection/handling, submission of specimen other than nasopharyngeal swab, presence  of viral mutation(s) within the areas targeted by this assay, and inadequate number of viral copies(<138 copies/mL). A negative result must be combined with clinical observations, patient history, and epidemiological information. The expected result is Negative.  Fact Sheet for Patients:  BloggerCourse.com  Fact Sheet for Healthcare Providers:  SeriousBroker.it  This test is no t yet approved or cleared by the [place] FDA and  has been authorized for detection and/or diagnosis of SARS-CoV-2 by FDA under an Emergency Use Authorization (EUA). This EUA will remain  in effect (meaning this test can be used) for the duration of the COVID-19 declaration under Section 564(b)(1) of the Act, 21 U.S.C.section 360bbb-3(b)(1), unless the authorization is terminated  or revoked sooner.       Influenza A by PCR NEGATIVE NEGATIVE Final   Influenza B by PCR NEGATIVE NEGATIVE Final    Comment:  (NOTE) The Xpert Xpress SARS-CoV-2/FLU/RSV plus assay is intended as an aid in the diagnosis of influenza from Nasopharyngeal swab specimens and should not be used as a sole basis for treatment. Nasal washings and aspirates are unacceptable for Xpert Xpress SARS-CoV-2/FLU/RSV testing.  Fact Sheet for Patients: BloggerCourse.com  Fact Sheet for Healthcare Providers: SeriousBroker.it  This test is not yet approved or cleared by the [place] FDA and has been authorized for detection and/or diagnosis of SARS-CoV-2 by FDA under an Emergency Use Authorization (EUA). This EUA will remain in effect (meaning this test can be used) for the duration of the COVID-19 declaration under Section 564(b)(1) of the Act, 21 U.S.C. section 360bbb-3(b)(1), unless the authorization is terminated or revoked.  Performed at [facility], [address] [place], [place] [address]         Radiology Studies: MR CERVICAL SPINE W WO CONTRAST  Result Date: 08/30/2020 CLINICAL DATA:  Nursing home patient with inability to urinate and move extremities. EXAM: MRI CERVICAL SPINE WITHOUT AND WITH CONTRAST TECHNIQUE: Multiplanar and multiecho pulse sequences of the cervical spine, to include the craniocervical junction and cervicothoracic junction, were obtained without and with intravenous contrast. CONTRAST:  67mL GADAVIST GADOBUTROL 1 MMOL/ML IV SOLN COMPARISON:  Thoracic MRI 08/25/2020. FINDINGS: Despite efforts by the technologist and patient, mild to moderate motion artifact is present on today's exam and could not be eliminated. This reduces exam sensitivity and specificity. Motion is greatest on the axial images. Intracranial findings are dictated separately. Alignment: Physiologic. Vertebrae: No acute or suspicious osseous findings. The spinal canal is small on a congenital basis. There is superimposed spondylosis which contributes to significant spinal stenosis  at the C3-4 and C4-5 levels. Cord: Cord evaluation limited by motion. There is multilevel cord compression, especially at C3-4 and C4-5. Probable underlying cord signal abnormality at these levels due to myelomalacia. The distal cervical cord appears atrophied. Posterior Fossa, vertebral arteries, paraspinal tissues: Intracranial findings dictated separately. No significant paraspinal findings. Bilateral vertebral artery flow voids. Disc levels: C2-3: No significant findings. C3-4: Spondylosis with posterior osteophytes covering diffusely bulging disc material. Severe spinal stenosis with effacement of the CSF surrounding the cord and narrowing of the AP diameter of the canal to 5 mm. Probable abnormal cord signal consistent with myelomalacia. Limited foraminal assessment; probable moderate foraminal narrowing bilaterally. C4-5: Spondylosis with posterior osteophytes covering diffusely bulging disc material. Severe spinal stenosis with effacement of the CSF surrounding the cord and narrowing of the AP diameter of the canal to 3-4 mm. Probable abnormal cord signal consistent with myelomalacia. Limited foraminal assessment; probable moderate foraminal narrowing bilaterally. C5-6: Lesser spondylosis at  this level with asymmetric uncinate spurring on the right. Mild spinal stenosis and moderate right foraminal narrowing. C6-7: Lesser spondylosis at this level with disc bulging and posterior osteophytes. Mild spinal stenosis and mild foraminal narrowing bilaterally. C7-T1: The spinal canal is adequately patent. Mild right foraminal narrowing. IMPRESSION: 1. Multilevel spondylosis superimposed on a congenitally small spinal canal. 2. Severe multifactorial spinal stenosis at C3-4 and C4-5 with cord compression and probable abnormal cord signal most consistent with myelomalacia. 3. Mild spinal stenosis at C5-6 and C6-7. 4. Detail limited by motion. Probable moderate foraminal narrowing at the C3-4 and C4-5 levels. 5. No  suspected acute findings. 6. These results will be called to the ordering clinician or representative by the Radiologist Assistant, and communication documented in the PACS or [employer]. Electronically Signed   By: Carey Bullocks M.D.   On: 08/30/2020 13:40        Scheduled Meds: . atorvastatin  20 mg Oral QHS  . Chlorhexidine Gluconate Cloth  6 each Topical Daily  . docusate sodium  100 mg Oral Daily  . finasteride  5 mg Oral Daily  . insulin aspart  0-9 Units Subcutaneous Q6H  . magnesium oxide  400 mg Oral Daily  . senna  2 tablet Oral BID  . tamsulosin  0.4 mg Oral Daily   Continuous Infusions:   LOS: 1 day     Jacquelin Hawking, MD [facility] 08/30/2020, 1:54 PM  If 7PM-7AM, please contact night-coverage [email or web address]

## 2020-08-30 NOTE — Progress Notes (Signed)
Patient ID: [name], male   DOB: [birth year], 70 y.o.   MRN: [id number] BP 117/70 (BP Location: Right Arm)   Pulse 100   Temp (!) 97.4 F (36.3 C) (Oral)   Resp 17   Ht 6' 0.01" (1.829 m)   Wt 53.9 kg   SpO2 99%   BMI 16.11 kg/m  Films have been reviewed. CT cervical spine shows stenosis. Will need ACDF at C3/4, 4/5 tomorrow. Will await his Aunt I believe to return tomorrow to explain the situation. He is scheduled for biopsy tomorrow. Due to his neurological findings there is no reasonable choice beside operative decompression in the cervical spine.

## 2020-08-30 NOTE — ED Notes (Signed)
Report given to [facility], [place], Paramedic.

## 2020-08-30 NOTE — Plan of Care (Signed)
  Problem: Nutrition: Goal: Adequate nutrition will be maintained Outcome: Progressing   Problem: Pain Managment: Goal: General experience of comfort will improve Outcome: Progressing   Problem: Safety: Goal: Ability to remain free from injury will improve Outcome: Progressing   Problem: Skin Integrity: Goal: Risk for impaired skin integrity will decrease Outcome: Progressing   Problem: Education: Goal: Knowledge of General Education information will improve Description: Including pain rating scale, medication(s)/side effects and non-pharmacologic comfort measures Outcome: Not Progressing   Problem: Health Behavior/Discharge Planning: Goal: Ability to manage health-related needs will improve Outcome: Not Progressing   Problem: Activity: Goal: Risk for activity intolerance will decrease Outcome: Not Progressing

## 2020-08-30 NOTE — Consult Note (Signed)
Chief Complaint: Patient was seen in consultation today for L3-4 discitis/aspiration.  Referring Physician(s): Narda Bonds [facility])  Supervising Physician: Julieanne Cotton  Patient Status: [facility] - In-pt  History of Present Illness: [name] is a 70 y.o. male with a past medical history of hypertension, intellectual delay, nephrolithiasis, urinary retention, diabetes mellitus, and gout. Patient is a resident of [address] in [place], [place]. He presented to [facility] ED via EMS from [facility] secondary to abnormal MR lumbar spine. In summary, patient with weakness in lower extremities and urinary retention for the past month- MR brain and cervical/thoracic/lumbar spine obtained on OP basis for further evaluation (see below for findings). He was sent to [facility] ED for further management and admitted due to findings. Neurosurgery was called who recommended IR consult for possible biopsy.  MR lumbar spine 08/25/2020: 1. Abnormal appearance of the L3-4 disc space with prominent edema in the L3 and L4 vertebral bodies and possible small endplate erosions but no significant paravertebral soft tissue inflammation. Findings are indeterminate for infectious discitis-osteomyelitis versus prominent degenerative edema. Correlate for clinical and laboratory evidence of infection. 2. Congenitally short pedicles with superimposed disc and facet degeneration resulting in severe spinal stenosis at L3-4 and L4-5 and moderate spinal stenosis at L1-2. 3. Severe neural foraminal stenosis at L3-4 and L5-S1.  IR consulted by Dr. Caleb Popp for possible image-guided L3-4 disc aspiration. Patient awake and alert laying in bed. ?Nonverbal at baseline due to intellectual delays but follows simple commands (opened mouth and rolled on side to command)- history difficult to obtain secondary to this.   Past Medical History:  Diagnosis Date  . Diabetes mellitus without complication (HCC)   . History of gout   .  History of kidney stones   . Hypertension   . Mentally challenged   . Urinary retention     Past Surgical History:  Procedure Laterality Date  . CHOLECYSTECTOMY N/A 05/25/2019   Procedure: OPEN CHOLECYSTECTOMY;  Surgeon: [name], MD;  Location: [facility];  Service: General;  Laterality: N/A;  . CYSTOSCOPY W/ URETERAL STENT PLACEMENT Bilateral 06/20/2016   Procedure: CYSTOSCOPY WITH RETROGRADE PYELOGRAM/URETERAL STENT PLACEMENT;  Surgeon: Malen Gauze, MD;  Location: [facility];  Service: Urology;  Laterality: Bilateral;  . CYSTOSCOPY WITH RETROGRADE PYELOGRAM, URETEROSCOPY AND STENT PLACEMENT Bilateral 08/20/2016   Procedure: CYSTOSCOPY WITH BILATERAL RETROGRADE PYELOGRAM, BILATERAL URETEROSCOPY,  LASER LITHOTRIPSY OF RIGHT URETERAL CALCULI, STONE BASKET EXTRACTION LEFT URETERAL CALCULI  AND BILATERAL URETERAL STENT EXCHANGE;  Surgeon: Malen Gauze, MD;  Location: [facility];  Service: Urology;  Laterality: Bilateral;  . CYSTOSCOPY WITH RETROGRADE PYELOGRAM, URETEROSCOPY AND STENT PLACEMENT Left 01/14/2019   Procedure: CYSTOSCOPY WITH LEFT RETROGRADE PYELOGRAM, LEFT URETEROSCOPY AND LEFT URETERAL STENT PLACEMENT;  Surgeon: Malen Gauze, MD;  Location: [facility];  Service: Urology;  Laterality: Left;  . CYSTOSCOPY WITH URETHRAL DILATATION  01/14/2019   Procedure: CYSTOSCOPY WITH URETHRAL DILATATION;  Surgeon: Malen Gauze, MD;  Location: [facility];  Service: Urology;;  . HOLMIUM LASER APPLICATION Bilateral 08/20/2016   Procedure: HOLMIUM LASER APPLICATION;  Surgeon: Malen Gauze, MD;  Location: [facility];  Service: Urology;  Laterality: Bilateral;  . HOLMIUM LASER APPLICATION Left 01/14/2019   Procedure: HOLMIUM LASER APPLICATION;  Surgeon: Malen Gauze, MD;  Location: [facility];  Service: Urology;  Laterality: Left;  . IR CATHETER TUBE CHANGE  07/04/2018  . IR EXCHANGE BILIARY DRAIN  06/09/2018  . IR EXCHANGE BILIARY DRAIN  10/20/2018  . IR EXCHANGE  BILIARY DRAIN  01/12/2019  .  IR EXCHANGE BILIARY DRAIN  04/07/2019  . IR PERC CHOLECYSTOSTOMY  05/24/2018  . LAPAROTOMY N/A 08/25/2018   Procedure: EXPLORATORY LAPAROTOMY reduction of volvulus;  Surgeon: [name], MD;  Location: [facility];  Service: General;  Laterality: N/A;  . LAPAROTOMY N/A 05/25/2019   Procedure: EXPLORATORY LAPAROTOMY;  Surgeon: [name], MD;  Location: [facility];  Service: General;  Laterality: N/A;    Allergies: Patient has no known allergies.  Medications: Prior to Admission medications   Medication Sig Start Date End Date Taking? Authorizing Provider  acetaminophen (TYLENOL) 325 MG tablet Take 650 mg by mouth in the morning, at noon, and at bedtime.   Yes [provider]  aspirin EC 81 MG tablet Take 1 tablet (81 mg total) by mouth daily with breakfast. 05/29/19  Yes Emokpae, Courage, MD  atorvastatin (LIPITOR) 20 MG tablet Take 20 mg by mouth at bedtime. 06/09/19  Yes [provider]  docusate sodium (COLACE) 100 MG capsule Take 100 mg by mouth daily.   Yes [provider]  finasteride (PROSCAR) 5 MG tablet Take 1 tablet (5 mg total) by mouth daily. 05/30/20  Yes McKenzie, Mardene Celeste, MD  magnesium oxide (MAG-OX) 400 MG tablet Take 1 tablet (400 mg total) by mouth daily. 10/23/18  Yes Sharee Holster, NP  metFORMIN (GLUCOPHAGE) 1000 MG tablet Take 1,000 mg by mouth 2 (two) times daily. 04/27/20  Yes [provider]  senna (SENOKOT) 8.6 MG tablet Take 2 tablets by mouth 2 (two) times daily.   Yes [provider]  tamsulosin (FLOMAX) 0.4 MG CAPS capsule Take 1 capsule (0.4 mg total) by mouth in the morning and at bedtime. Patient taking differently: Take 0.4 mg by mouth daily. 05/30/20  Yes McKenzie, Mardene Celeste, MD  traMADol (ULTRAM) 50 MG tablet Take 50 mg by mouth every 8 (eight) hours. 08/18/20  Yes [provider]  bisacodyl (DULCOLAX) 10 MG suppository Place 1 suppository (10 mg total) rectally daily. Patient not taking: No sig reported  03/03/20   Tat, Onalee Hua, MD  FEROSUL 325 (65 Fe) MG tablet Take 325 mg by mouth daily. Patient not taking: No sig reported 04/28/20   [provider]  linaclotide (LINZESS) 145 MCG CAPS capsule Take 145 mcg by mouth daily before breakfast. Patient not taking: No sig reported    [provider]  nitrofurantoin (MACRODANTIN) 50 MG capsule Take 1 capsule (50 mg total) by mouth at bedtime. Patient not taking: No sig reported 05/30/20   Malen Gauze, MD  Potassium Chloride ER 20 MEQ TBCR Take 20 mEq by mouth every Tuesday, Thursday, Saturday, and Sunday. 1 tab daily by mouth Patient not taking: No sig reported 05/30/19   Shon Hale, MD  simethicone (MYLICON) 40 MG/0.6ML drops Take 1.2 mLs (80 mg total) by mouth 4 (four) times daily as needed for flatulence. Patient not taking: No sig reported 04/05/20   Malissa Hippo, MD  vancomycin (VANCOCIN) 125 MG capsule Take 125 mg by mouth every 6 (six) hours. Patient not taking: No sig reported 06/25/20   [provider]     Family History  Problem Relation Age of Onset  . Gallbladder disease Other     Social History   Socioeconomic History  . Marital status: Single    Spouse name: Not on file  . Number of children: Not on file  . Years of education: Not on file  . Highest education level: Not on  file  Occupational History  . Not on file  Tobacco Use  . Smoking status: Never Smoker  . Smokeless tobacco: Never Used  Vaping Use  . Vaping Use: Never used  Substance and Sexual Activity  . Alcohol use: No  . Drug use: No  . Sexual activity: Never    Birth control/protection: None  Other Topics Concern  . Not on file  Social History Narrative   Patient is delayed lives with family   Social Determinants of Health   Financial Resource Strain: Not on file  Food Insecurity: Not on file  Transportation Needs: Not on file  Physical Activity: Not on file  Stress: Not on file  Social Connections: Not on  file     Review of Systems: A 12 point ROS discussed and pertinent positives are indicated in the HPI above.  All other systems are negative.  Review of Systems  Unable to perform ROS: Patient nonverbal    Vital Signs: BP 139/78 (BP Location: Left Arm)   Pulse 99   Temp 97.8 F (36.6 C) (Oral)   Resp 16   Ht 6' 0.01" (1.829 m)   Wt 118 lb 13.3 oz (53.9 kg)   SpO2 99%   BMI 16.11 kg/m   Physical Exam Constitutional:      General: He is not in acute distress.    Comments: Nonverbal.  Cardiovascular:     Rate and Rhythm: Normal rate and regular rhythm.     Heart sounds: Normal heart sounds. No murmur heard.   Pulmonary:     Effort: Pulmonary effort is normal. No respiratory distress.     Breath sounds: Normal breath sounds. No wheezing.  Musculoskeletal:     Comments: Nods yes to tenderness when palpating midline lower spine.  Neurological:     Mental Status: He is alert.     Comments: Nonverbal. Follows simple commands (opened mouth and rolled on side to command)      MD Evaluation Airway: WNL Heart: WNL Abdomen: WNL Chest/ Lungs: WNL ASA  Classification: 3 Mallampati/Airway Score: Two   Imaging: MR THORACIC SPINE W WO CONTRAST  Result Date: 08/26/2020 CLINICAL DATA:  Lower extremity weakness.  Urinary retention. EXAM: MRI THORACIC AND LUMBAR SPINE WITHOUT AND WITH CONTRAST TECHNIQUE: Multiplanar and multiecho pulse sequences of the thoracic and lumbar spine were obtained without and with intravenous contrast. CONTRAST:  61mL GADAVIST GADOBUTROL 1 MMOL/ML IV SOLN COMPARISON:  Thoracic and lumbar spine radiographs 06/29/2020. CT abdomen and pelvis 07/30/2020. FINDINGS: MRI THORACIC SPINE FINDINGS Alignment:  Normal. Vertebrae: Chronic T12 compression fracture with mild anterior vertebral body height loss. No acute fracture or suspicious osseous lesion. Hemangiomas in the T12 vertebral body. No evidence of discitis. Cord: Normal cord signal and morphology. No  abnormal intradural enhancement. Paraspinal and other soft tissues: Unremarkable. Disc levels: Prominent dorsal epidural fat throughout the mid and lower thoracic spine. Mild thoracic spondylosis. No compressive stenosis. MRI LUMBAR SPINE FINDINGS Segmentation:  Standard. Alignment:  Trace anterolisthesis of L5 on S1. Vertebrae: There is abnormal T2 hyperintensity in the L3-4 disc space with prominent marrow edema and moderate enhancement in the adjacent L3 and L4 vertebral bodies. There is irregularity of the L3 inferior and L4 superior endplates which may reflect small erosions or degenerative Schmorl's nodes. No fracture or suspicious focal marrow lesion is identified. Conus medullaris: Extends to the L1-2 level and appears normal. Paraspinal and other soft tissues: No significant inflammatory changes are seen in the paraspinal soft tissues. No paraspinal or  epidural fluid collection is evident. Disc levels: Diffuse congenital narrowing of the lumbar spinal canal due to short pedicles. Diffuse lumbar disc desiccation. Mild disc space narrowing at L3-4 and L4-5. L1-2: Disc bulging, a calcified central disc extrusion, endplate spurring, congenitally short pedicles, and moderate facet and ligamentum flavum hypertrophy result in moderate spinal stenosis, mild right greater than left lateral recess stenosis, and mild right neural foraminal stenosis. L2-3: Disc bulging, endplate spurring, congenitally short pedicles, and moderate facet and ligamentum flavum hypertrophy result in mild spinal stenosis, mild right greater than left lateral recess stenosis, and mild-to-moderate bilateral neural foraminal stenosis. L3-4: Circumferential disc osteophyte complex, congenitally short pedicles, and severe facet and ligamentum flavum hypertrophy result in severe spinal stenosis and severe bilateral neural foraminal stenosis. Potential bilateral L3 and L4 nerve root impingement. L4-5: Circumferential disc osteophyte complex  including a prominent right paracentral component and severe posterior element hypertrophy with ligamentous ossification result in severe spinal stenosis, right greater than left lateral recess stenosis, and moderate right mild left neural foraminal stenosis. Potential bilateral neural impingement at this level, particularly of the right L5 nerve root in the lateral recess. L5-S1: Anterolisthesis with bulging uncovered disc, a left foraminal disc osteophyte complex, and severe facet hypertrophy result in mild bilateral lateral recess stenosis and mild right and severe left neural foraminal stenosis with left L5 nerve root compression in the foramen. No significant generalized spinal stenosis. IMPRESSION: 1. Abnormal appearance of the L3-4 disc space with prominent edema in the L3 and L4 vertebral bodies and possible small endplate erosions but no significant paravertebral soft tissue inflammation. Findings are indeterminate for infectious discitis-osteomyelitis versus prominent degenerative edema. Correlate for clinical and laboratory evidence of infection. 2. Congenitally short pedicles with superimposed disc and facet degeneration resulting in severe spinal stenosis at L3-4 and L4-5 and moderate spinal stenosis at L1-2. 3. Severe neural foraminal stenosis at L3-4 and L5-S1. These results will be called to the ordering clinician or representative by the Radiologist Assistant, and communication documented in the PACS or [employer]. Electronically Signed   By: Sebastian Ache M.D.   On: 08/26/2020 11:36   MR LUMBAR SPINE W WO CONTRAST  Result Date: 08/26/2020 CLINICAL DATA:  Lower extremity weakness.  Urinary retention. EXAM: MRI THORACIC AND LUMBAR SPINE WITHOUT AND WITH CONTRAST TECHNIQUE: Multiplanar and multiecho pulse sequences of the thoracic and lumbar spine were obtained without and with intravenous contrast. CONTRAST:  32mL GADAVIST GADOBUTROL 1 MMOL/ML IV SOLN COMPARISON:  Thoracic and lumbar spine  radiographs 06/29/2020. CT abdomen and pelvis 07/30/2020. FINDINGS: MRI THORACIC SPINE FINDINGS Alignment:  Normal. Vertebrae: Chronic T12 compression fracture with mild anterior vertebral body height loss. No acute fracture or suspicious osseous lesion. Hemangiomas in the T12 vertebral body. No evidence of discitis. Cord: Normal cord signal and morphology. No abnormal intradural enhancement. Paraspinal and other soft tissues: Unremarkable. Disc levels: Prominent dorsal epidural fat throughout the mid and lower thoracic spine. Mild thoracic spondylosis. No compressive stenosis. MRI LUMBAR SPINE FINDINGS Segmentation:  Standard. Alignment:  Trace anterolisthesis of L5 on S1. Vertebrae: There is abnormal T2 hyperintensity in the L3-4 disc space with prominent marrow edema and moderate enhancement in the adjacent L3 and L4 vertebral bodies. There is irregularity of the L3 inferior and L4 superior endplates which may reflect small erosions or degenerative Schmorl's nodes. No fracture or suspicious focal marrow lesion is identified. Conus medullaris: Extends to the L1-2 level and appears normal. Paraspinal and other soft tissues: No significant inflammatory changes are seen in  the paraspinal soft tissues. No paraspinal or epidural fluid collection is evident. Disc levels: Diffuse congenital narrowing of the lumbar spinal canal due to short pedicles. Diffuse lumbar disc desiccation. Mild disc space narrowing at L3-4 and L4-5. L1-2: Disc bulging, a calcified central disc extrusion, endplate spurring, congenitally short pedicles, and moderate facet and ligamentum flavum hypertrophy result in moderate spinal stenosis, mild right greater than left lateral recess stenosis, and mild right neural foraminal stenosis. L2-3: Disc bulging, endplate spurring, congenitally short pedicles, and moderate facet and ligamentum flavum hypertrophy result in mild spinal stenosis, mild right greater than left lateral recess stenosis, and  mild-to-moderate bilateral neural foraminal stenosis. L3-4: Circumferential disc osteophyte complex, congenitally short pedicles, and severe facet and ligamentum flavum hypertrophy result in severe spinal stenosis and severe bilateral neural foraminal stenosis. Potential bilateral L3 and L4 nerve root impingement. L4-5: Circumferential disc osteophyte complex including a prominent right paracentral component and severe posterior element hypertrophy with ligamentous ossification result in severe spinal stenosis, right greater than left lateral recess stenosis, and moderate right mild left neural foraminal stenosis. Potential bilateral neural impingement at this level, particularly of the right L5 nerve root in the lateral recess. L5-S1: Anterolisthesis with bulging uncovered disc, a left foraminal disc osteophyte complex, and severe facet hypertrophy result in mild bilateral lateral recess stenosis and mild right and severe left neural foraminal stenosis with left L5 nerve root compression in the foramen. No significant generalized spinal stenosis. IMPRESSION: 1. Abnormal appearance of the L3-4 disc space with prominent edema in the L3 and L4 vertebral bodies and possible small endplate erosions but no significant paravertebral soft tissue inflammation. Findings are indeterminate for infectious discitis-osteomyelitis versus prominent degenerative edema. Correlate for clinical and laboratory evidence of infection. 2. Congenitally short pedicles with superimposed disc and facet degeneration resulting in severe spinal stenosis at L3-4 and L4-5 and moderate spinal stenosis at L1-2. 3. Severe neural foraminal stenosis at L3-4 and L5-S1. These results will be called to the ordering clinician or representative by the Radiologist Assistant, and communication documented in the PACS or [employer]. Electronically Signed   By: Sebastian Ache M.D.   On: 08/26/2020 11:36    Labs:  CBC: Recent Labs    07/07/20 1514  07/30/20 1615 09/11/2020 1603 08/30/20 0343  WBC 6.3 6.1 9.6 7.2  HGB 10.1* 11.9* 11.9* 11.1*  HCT 33.2* 39.4 38.5* 36.5*  PLT 233 253 227 339    COAGS: Recent Labs    06/15/20 0858 06/16/20 0421 07/07/20 1517  INR 1.2 1.2 1.0  APTT 37*  --   --     BMP: Recent Labs    07/07/20 1517 07/30/20 1615 09/11/2020 1603 08/30/20 0343  NA 136 131* 131* 135  K 3.6 3.0* 4.2 4.1  CL 99 92* 95* 101  CO2 GLUCOSE 116* 196* 115* 85  BUN CALCIUM 8.8* 9.0 9.1 8.5*  CREATININE 0.63 0.69 0.72 0.58*  GFRNONAA >60 >60 >60 >60    LIVER FUNCTION TESTS: Recent Labs    06/15/20 0858 07/07/20 1517 07/30/20 1615 08/22/2020 1603  BILITOT 0.7 0.6 0.5 0.5  AST 17 10* 16 16  ALT ALKPHOS 66 52 82 105  PROT 7.4 7.1 7.9 7.5  ALBUMIN 3.3* 3.0* 3.0* 2.9*     Assessment and Plan:  L3-4 abnormal appearance of disc space along with prominent edema of L3-4 vertebral bodies concerning for possible discitis/osteomyelitis. Plan for image-guided L3-4 disc  aspiration in IR with Dr. Corliss Skainseveshwar tentatively for tomorrow 08/31/2020 pending IR scheduling. Patient will be NPO at midnight. Afebrile and WBCs WNL. He does not take blood thinners. INR pending.  Risks and benefits discussed with the patient including, but not limited to bleeding, infection, damage to adjacent structures or low yield requiring additional tests. All of the patient's aunt's/legal guardian's questions were answered, she is agreeable to proceed. Consent obtained by patient's aunt/legal guardian, Otelia SergeantGeneva Long, via telephone- signed and in IR control room.   Thank you for this interesting consult.  I greatly enjoyed meeting [name] and look forward to participating in their care.  A copy of this report was sent to the requesting provider on this date.  Electronically Signed: Elwin MochaAlexandra Michele Kerlin, PA-C 08/30/2020, 11:02 AM   I spent a total of 20 Minutes in face to face in clinical  consultation, greater than 50% of which was counseling/coordinating care for L3-4 discitis/aspiration.

## 2020-08-31 ENCOUNTER — Inpatient Hospital Stay (HOSPITAL_COMMUNITY): Payer: Medicare Other

## 2020-08-31 ENCOUNTER — Encounter (HOSPITAL_COMMUNITY): Payer: Self-pay | Admitting: Registered Nurse

## 2020-08-31 DIAGNOSIS — I10 Essential (primary) hypertension: Secondary | ICD-10-CM | POA: Diagnosis not present

## 2020-08-31 DIAGNOSIS — R29898 Other symptoms and signs involving the musculoskeletal system: Secondary | ICD-10-CM | POA: Diagnosis not present

## 2020-08-31 DIAGNOSIS — F79 Unspecified intellectual disabilities: Secondary | ICD-10-CM | POA: Diagnosis not present

## 2020-08-31 DIAGNOSIS — E119 Type 2 diabetes mellitus without complications: Secondary | ICD-10-CM | POA: Diagnosis not present

## 2020-08-31 LAB — SURGICAL PCR SCREEN
MRSA, PCR: NEGATIVE
Staphylococcus aureus: NEGATIVE

## 2020-08-31 LAB — GLUCOSE, CAPILLARY
Glucose-Capillary: 114 mg/dL — ABNORMAL HIGH (ref 70–99)
Glucose-Capillary: 73 mg/dL (ref 70–99)
Glucose-Capillary: 79 mg/dL (ref 70–99)
Glucose-Capillary: 82 mg/dL (ref 70–99)

## 2020-08-31 MED ORDER — FENTANYL CITRATE (PF) 100 MCG/2ML IJ SOLN
INTRAMUSCULAR | Status: AC | PRN
  Administered 2020-08-31: 25 ug via INTRAVENOUS

## 2020-08-31 MED ORDER — BUPIVACAINE HCL (PF) 0.5 % IJ SOLN
INTRAMUSCULAR | Status: AC
  Filled 2020-08-31: qty 30

## 2020-08-31 MED ORDER — LIDOCAINE 2% (20 MG/ML) 5 ML SYRINGE
INTRAMUSCULAR | Status: AC
  Filled 2020-08-31: qty 5

## 2020-08-31 MED ORDER — FENTANYL CITRATE (PF) 250 MCG/5ML IJ SOLN
INTRAMUSCULAR | Status: AC
  Filled 2020-08-31: qty 5

## 2020-08-31 MED ORDER — PROPOFOL 10 MG/ML IV BOLUS
INTRAVENOUS | Status: AC
  Filled 2020-08-31: qty 20

## 2020-08-31 MED ORDER — MUPIROCIN 2 % EX OINT: 1.0000 "application " | TOPICAL_OINTMENT | Freq: Two times a day (BID) | CUTANEOUS | Status: DC

## 2020-08-31 MED ORDER — BUPIVACAINE HCL (PF) 0.5 % IJ SOLN
INTRAMUSCULAR | Status: AC | PRN
  Administered 2020-08-31: 30 mL

## 2020-08-31 MED ORDER — FENTANYL CITRATE (PF) 100 MCG/2ML IJ SOLN
INTRAMUSCULAR | Status: AC
  Filled 2020-08-31: qty 2

## 2020-08-31 MED ORDER — LIDOCAINE HCL (PF) 1 % IJ SOLN
INTRAMUSCULAR | Status: AC
  Filled 2020-08-31: qty 30

## 2020-08-31 NOTE — Plan of Care (Signed)
  Problem: Nutrition: Goal: Adequate nutrition will be maintained Outcome: Progressing   Problem: Pain Managment: Goal: General experience of comfort will improve Outcome: Progressing   Problem: Education: Goal: Knowledge of General Education information will improve Description: Including pain rating scale, medication(s)/side effects and non-pharmacologic comfort measures Outcome: Not Progressing   Problem: Health Behavior/Discharge Planning: Goal: Ability to manage health-related needs will improve Outcome: Not Progressing   Problem: Activity: Goal: Risk for activity intolerance will decrease Outcome: Not Progressing   

## 2020-08-31 NOTE — Progress Notes (Signed)
Patient ID: [name], male   DOB: 01-10-1951, 70 y.o.   MRN: [id number] BP (!) 126/58 (BP Location: Right Arm)   Pulse 91   Temp 98.1 F (36.7 C) (Oral)   Resp 16   Ht 6' 0.01" (1.829 m)   Wt 53.9 kg   SpO2 100%   BMI 16.11 kg/m  I have spoken with [name]' aunt this morning. She expects to be here sometime after 1200. I have explained the need for the operation, but will be able to complete my consult upon her arrival. I have placed him on the OR schedule. As an add on case the scheduled time is to be determined.

## 2020-08-31 NOTE — Progress Notes (Signed)
PROGRESS NOTE    [name]  TAV:[id number] DOB: 11/25/1950 DOA: 09/02/2020 PCP: Pearson Grippe, MD   Brief Narrative: [name] is a 70 y.o. male with a history of intellectual disability, diabetes mellitus.  Patient presented from a nursing home secondary bilateral lower extremity weakness, urinary retention and abnormal MRI.  Patient was found to have an L3-L4 disc space edema concerning for possible discitis/osteomyelitis versus degenerative edema.  Neurosurgery was consulted on admission who recommended IR consult for aspiration.  Blood cultures obtained on admission.  Antibiotics held secondary to stable patient.   Assessment & Plan:   Principal Problem:   Lower extremity weakness Active Problems:   Urine retention   Intellectual disability   HTN (hypertension)   Diabetes mellitus type 2, noninsulin dependent (HCC)   Nonverbal   Severe spinal stenosis C3/4 and C4/5 with cord compression Bilateral LE weakness - Neurosurgery following, Dr. Franky Macho consulted on admission - IR consulted for lumbar aspiration -unclear if this still needs to be done in light of new imaging -Tentative plan for surgical intervention per neurosurgery discussion with family given patient is unable to consent himself  Urinary retention, chronic - Known history of BPH and chronic Foley use.   - Patient presented to the hospital with a Foley catheter already in place.  Lactic acidosis -Likely in setting of hypovolemia, continue IV fluids  Diabetes mellitus, type 2 Hemoglobin A1C of 7.1%. On metformin as an outpatient. Started on SSI inpatient. Follow hypoglycemia protocol  Hyperlipidemia -Continue Lipitor  BPH Patient is on Flomax and finasteride as an outpatient -Continue finasteride and Flomax  Intellectual disability Non-verbal at baseline Does not appear to follow simple commands  DVT prophylaxis: SCDs Code Status:   Code Status: Full Code Family Communication: None at  bedside. Aunt on telephone Disposition Plan: Pending surgical intervention, likely discharge back to facility once medically stable in the next 3 to 4 days   Consultants:   Neurosurgery  Interventional radiology  Procedures:   None  Antimicrobials:  None   Subjective: Non-verbal.  No acute issues or events overnight per staff  Objective: Vitals:   08/30/20 0750 08/30/20 1120 08/30/20 1951 08/31/20 0306  BP: 139/78 (!) 105/59 117/70 116/63  Pulse: 99 91 100 87  Resp: 16 17 17 16   Temp: 97.8 F (36.6 C) 98 F (36.7 C) (!) 97.4 F (36.3 C) 98.5 F (36.9 C)  TempSrc: Oral Oral Oral Oral  SpO2: 99% 100% 99% 100%  Weight:      Height:        Intake/Output Summary (Last 24 hours) at 08/31/2020 0724 Last data filed at 08/30/2020 2100 Gross per 24 hour  Intake 340 ml  Output 300 ml  Net 40 ml   Filed Weights   09/07/2020 1451 08/30/20 0201  Weight: 72.6 kg 53.9 kg    Examination:  General exam: Appears calm and comfortable Respiratory system: Clear to auscultation. Respiratory effort normal. Cardiovascular system: S1 & S2 heard, RRR. No murmurs, rubs, gallops or clicks. Gastrointestinal system: Abdomen is nondistended, soft and nontender. No organomegaly or masses felt. Normal bowel sounds heard. Central nervous system: Awake alert, unable to assess orientation, does not follow simple commands.  4/5 strength globally based on limited exam Musculoskeletal: No edema. No calf tenderness Skin: No cyanosis. No rashes   Data Reviewed: I have personally reviewed following labs and imaging studies  CBC Lab Results  Component Value Date   WBC 7.2 08/30/2020   RBC 4.08 (L) 08/30/2020  HGB 11.1 (L) 08/30/2020   HCT 36.5 (L) 08/30/2020   MCV 89.5 08/30/2020   MCH 27.2 08/30/2020   PLT 339 08/30/2020   MCHC 30.4 08/30/2020   RDW 17.5 (H) 08/30/2020   LYMPHSABS 0.7 10/24/2020   MONOABS 0.5 10/24/2020   EOSABS 0.0 10/24/2020   BASOSABS 0.0 10/24/2020     Last  metabolic panel Lab Results  Component Value Date   NA 135 08/30/2020   K 4.1 08/30/2020   CL 101 08/30/2020   CO2 27 08/30/2020   BUN 12 08/30/2020   CREATININE 0.58 (L) 08/30/2020   GLUCOSE 85 08/30/2020   GFRNONAA >60 08/30/2020   GFRAA >60 07/07/2019   CALCIUM 8.5 (L) 08/30/2020   PHOS 2.6 03/03/2020   PROT 7.5 10/24/2020   ALBUMIN 2.9 (L) 10/24/2020   BILITOT 0.5 10/24/2020   ALKPHOS 105 10/24/2020   AST 16 10/24/2020   ALT 13 10/24/2020   ANIONGAP 7 08/30/2020    CBG (last 3)  Recent Labs    08/30/20 1950 08/30/20 2349 08/31/20 0631  GLUCAP 100* 81 82     GFR: Estimated Creatinine Clearance: 65.5 mL/min (A) (by C-G formula based on SCr of 0.58 mg/dL (L)).  Coagulation Profile: Recent Labs  Lab 08/30/20 1031  INR 1.1    Recent Results (from the past 240 hour(s))  Culture, blood (routine x 2)     Status: None (Preliminary result)   Collection Time: 2020/04/17  4:03 PM   Specimen: BLOOD RIGHT ARM  Result Value Ref Range Status   Specimen Description   Final    BLOOD RIGHT ARM Performed at [facility], [address] [place], [place] [address]    Special Requests   Final    BOTTLES DRAWN AEROBIC AND ANAEROBIC Blood Culture adequate volume Performed at [facility], [address] [place], [place] [address]    Culture   Final    NO GROWTH < 24 HOURS Performed at [facility], [address] [place], [place] [address]    Report Status PENDING  Incomplete  Culture, blood (routine x 2)     Status: None (Preliminary result)   Collection Time: 2020/04/17  4:03 PM   Specimen: BLOOD RIGHT HAND  Result Value Ref Range Status   Specimen Description   Final    BLOOD RIGHT HAND Performed at [facility], [address] [place], [place] [address]    Special Requests   Final    BOTTLES DRAWN AEROBIC AND ANAEROBIC Blood Culture adequate volume Performed at [facility], [address] [place], [place] [address]    Culture   Final     NO GROWTH < 24 HOURS Performed at [facility], [address] [place], [place] [address]    Report Status PENDING  Incomplete  Resp Panel by RT-PCR (Flu A&B, Covid) Nasopharyngeal Swab     Status: None   Collection Time: 2020/04/17  6:05 PM   Specimen: Nasopharyngeal Swab; Nasopharyngeal(NP) swabs in vial transport medium  Result Value Ref Range Status   SARS Coronavirus 2 by RT PCR NEGATIVE NEGATIVE Final    Comment: (NOTE) SARS-CoV-2 target nucleic acids are NOT DETECTED.  The SARS-CoV-2 RNA is generally detectable in upper respiratory specimens during the acute phase of infection. The lowest concentration of SARS-CoV-2 viral copies this assay can detect is 138 copies/mL. A negative result does not preclude SARS-Cov-2 infection and should not be used as the sole basis for treatment or other patient management decisions. A  negative result may occur with  improper specimen collection/handling, submission of specimen other than nasopharyngeal swab, presence of viral mutation(s) within the areas targeted by this assay, and inadequate number of viral copies(<138 copies/mL). A negative result must be combined with clinical observations, patient history, and epidemiological information. The expected result is Negative.  Fact Sheet for Patients:  BloggerCourse.com  Fact Sheet for Healthcare Providers:  SeriousBroker.it  This test is no t yet approved or cleared by the [place] FDA and  has been authorized for detection and/or diagnosis of SARS-CoV-2 by FDA under an Emergency Use Authorization (EUA). This EUA will remain  in effect (meaning this test can be used) for the duration of the COVID-19 declaration under Section 564(b)(1) of the Act, 21 U.S.C.section 360bbb-3(b)(1), unless the authorization is terminated  or revoked sooner.       Influenza A by PCR NEGATIVE NEGATIVE Final   Influenza B by PCR NEGATIVE NEGATIVE Final     Comment: (NOTE) The Xpert Xpress SARS-CoV-2/FLU/RSV plus assay is intended as an aid in the diagnosis of influenza from Nasopharyngeal swab specimens and should not be used as a sole basis for treatment. Nasal washings and aspirates are unacceptable for Xpert Xpress SARS-CoV-2/FLU/RSV testing.  Fact Sheet for Patients: BloggerCourse.com  Fact Sheet for Healthcare Providers: SeriousBroker.it  This test is not yet approved or cleared by the [place] FDA and has been authorized for detection and/or diagnosis of SARS-CoV-2 by FDA under an Emergency Use Authorization (EUA). This EUA will remain in effect (meaning this test can be used) for the duration of the COVID-19 declaration under Section 564(b)(1) of the Act, 21 U.S.C. section 360bbb-3(b)(1), unless the authorization is terminated or revoked.  Performed at [facility], [address] [place], [place] [address]         Radiology Studies: MR BRAIN W WO CONTRAST  Result Date: 08/30/2020 CLINICAL DATA:  Unable to pass urine.  Right hemiplegia EXAM: MRI HEAD WITHOUT AND WITH CONTRAST TECHNIQUE: Multiplanar, multiecho pulse sequences of the brain and surrounding structures were obtained without and with intravenous contrast. CONTRAST:  32mL GADAVIST GADOBUTROL 1 MMOL/ML IV SOLN COMPARISON:  CT head 01/16/2005 FINDINGS: Brain: Negative for acute infarct. Few small white matter hyperintensities bilaterally. Ventricle size normal. No hemorrhage or mass. Motion degraded study. Progressive motion throughout the study. Postcontrast images are markedly degraded by motion. No enhancing lesion identified. Vascular: Normal arterial flow voids. Skull and upper cervical spine: Negative Sinuses/Orbits: Mild mucosal edema right maxillary sinus. Remaining sinuses clear. Negative orbit Other: None IMPRESSION: Motion degraded study. There is considerable motion especially on the postcontrast images  No acute abnormality identified. Electronically Signed   By: Marlan Palau M.D.   On: 08/30/2020 14:39   MR CERVICAL SPINE W WO CONTRAST  Result Date: 08/30/2020 CLINICAL DATA:  Nursing home patient with inability to urinate and move extremities. EXAM: MRI CERVICAL SPINE WITHOUT AND WITH CONTRAST TECHNIQUE: Multiplanar and multiecho pulse sequences of the cervical spine, to include the craniocervical junction and cervicothoracic junction, were obtained without and with intravenous contrast. CONTRAST:  54mL GADAVIST GADOBUTROL 1 MMOL/ML IV SOLN COMPARISON:  Thoracic MRI 08/25/2020. FINDINGS: Despite efforts by the technologist and patient, mild to moderate motion artifact is present on today's exam and could not be eliminated. This reduces exam sensitivity and specificity. Motion is greatest on the axial images. Intracranial findings are dictated separately. Alignment: Physiologic. Vertebrae: No acute or suspicious osseous findings. The spinal canal is small on a congenital basis. There is superimposed  spondylosis which contributes to significant spinal stenosis at the C3-4 and C4-5 levels. Cord: Cord evaluation limited by motion. There is multilevel cord compression, especially at C3-4 and C4-5. Probable underlying cord signal abnormality at these levels due to myelomalacia. The distal cervical cord appears atrophied. Posterior Fossa, vertebral arteries, paraspinal tissues: Intracranial findings dictated separately. No significant paraspinal findings. Bilateral vertebral artery flow voids. Disc levels: C2-3: No significant findings. C3-4: Spondylosis with posterior osteophytes covering diffusely bulging disc material. Severe spinal stenosis with effacement of the CSF surrounding the cord and narrowing of the AP diameter of the canal to 5 mm. Probable abnormal cord signal consistent with myelomalacia. Limited foraminal assessment; probable moderate foraminal narrowing bilaterally. C4-5: Spondylosis with posterior  osteophytes covering diffusely bulging disc material. Severe spinal stenosis with effacement of the CSF surrounding the cord and narrowing of the AP diameter of the canal to 3-4 mm. Probable abnormal cord signal consistent with myelomalacia. Limited foraminal assessment; probable moderate foraminal narrowing bilaterally. C5-6: Lesser spondylosis at this level with asymmetric uncinate spurring on the right. Mild spinal stenosis and moderate right foraminal narrowing. C6-7: Lesser spondylosis at this level with disc bulging and posterior osteophytes. Mild spinal stenosis and mild foraminal narrowing bilaterally. C7-T1: The spinal canal is adequately patent. Mild right foraminal narrowing. IMPRESSION: 1. Multilevel spondylosis superimposed on a congenitally small spinal canal. 2. Severe multifactorial spinal stenosis at C3-4 and C4-5 with cord compression and probable abnormal cord signal most consistent with myelomalacia. 3. Mild spinal stenosis at C5-6 and C6-7. 4. Detail limited by motion. Probable moderate foraminal narrowing at the C3-4 and C4-5 levels. 5. No suspected acute findings. 6. These results will be called to the ordering clinician or representative by the Radiologist Assistant, and communication documented in the PACS or [employer]. Electronically Signed   By: Carey Bullocks M.D.   On: 08/30/2020 13:40        Scheduled Meds: . atorvastatin  20 mg Oral QHS  . Chlorhexidine Gluconate Cloth  6 each Topical Daily  . docusate sodium  100 mg Oral Daily  . finasteride  5 mg Oral Daily  . insulin aspart  0-9 Units Subcutaneous Q6H  . magnesium oxide  400 mg Oral Daily  . senna  2 tablet Oral BID  . tamsulosin  0.4 mg Oral Daily    LOS: 2 days   Carma Leaven, DO [facility] 08/31/2020, 7:24 AM  If 7PM-7AM, please contact night-coverage [email or web address]

## 2020-08-31 NOTE — Progress Notes (Signed)
IR consulted by Dr. Caleb Popp for possible image-guided L3-4 disc aspiration.  Please see my consult note from 08/30/2020 for further information.  Patient was brought down to IR for procedure today. Per Dr. Corliss Skains, patient unable to tolerate procedure. Has history of intellectual delay- per Dr. Corliss Skains will not cooperate and unable to preform procedure safely- he is requesting general anesthesia for patient. Discussed with anesthesia- no availability today but able to accommodate tomorrow.  Plan for image-guided L3-4 disc aspiration in IR with Dr. Corliss Skains tentatively for tomorrow 08/17/2020 at 1200 pending IR schedule. Patient sent back to room. Diet restarted- will make NPO at midnight.  Further plans per [facility]- appreciate and agree with management. IR to follow.   Waylan Boga Anton Cheramie, PA-C 08/31/2020, 1:40 PM

## 2020-08-31 NOTE — Progress Notes (Signed)
Patient ID: [name], male   DOB: May 05, 1950, 70 y.o.   MRN: [id number] BP (!) 126/58 (BP Location: Right Arm)   Pulse 91   Temp 98.1 F (36.7 C) (Oral)   Resp 16   Ht 6' 0.01" (1.829 m)   Wt 53.9 kg   SpO2 100%   BMI 16.11 kg/m  [name] is able to move all extremities while in bed. His Aunt was not present in the late afternoon. She had left due to the IR procedure being canceled. We have coordinated the OR schedule so that when he finishes in IR we can proceed to the operating room for the ACDF.  No Known Allergies Past Medical History:  Diagnosis Date  . Diabetes mellitus without complication (HCC)   . History of gout   . History of kidney stones   . Hypertension   . Mentally challenged   . Urinary retention    Past Surgical History:  Procedure Laterality Date  . CHOLECYSTECTOMY N/A 05/25/2019   Procedure: OPEN CHOLECYSTECTOMY;  Surgeon: [name], MD;  Location: [facility];  Service: General;  Laterality: N/A;  . CYSTOSCOPY W/ URETERAL STENT PLACEMENT Bilateral 06/20/2016   Procedure: CYSTOSCOPY WITH RETROGRADE PYELOGRAM/URETERAL STENT PLACEMENT;  Surgeon: Malen Gauze, MD;  Location: [facility];  Service: Urology;  Laterality: Bilateral;  . CYSTOSCOPY WITH RETROGRADE PYELOGRAM, URETEROSCOPY AND STENT PLACEMENT Bilateral 08/20/2016   Procedure: CYSTOSCOPY WITH BILATERAL RETROGRADE PYELOGRAM, BILATERAL URETEROSCOPY,  LASER LITHOTRIPSY OF RIGHT URETERAL CALCULI, STONE BASKET EXTRACTION LEFT URETERAL CALCULI  AND BILATERAL URETERAL STENT EXCHANGE;  Surgeon: Malen Gauze, MD;  Location: [facility];  Service: Urology;  Laterality: Bilateral;  . CYSTOSCOPY WITH RETROGRADE PYELOGRAM, URETEROSCOPY AND STENT PLACEMENT Left 01/14/2019   Procedure: CYSTOSCOPY WITH LEFT RETROGRADE PYELOGRAM, LEFT URETEROSCOPY AND LEFT URETERAL STENT PLACEMENT;  Surgeon: Malen Gauze, MD;  Location: [facility];  Service: Urology;  Laterality: Left;  . CYSTOSCOPY WITH URETHRAL DILATATION   01/14/2019   Procedure: CYSTOSCOPY WITH URETHRAL DILATATION;  Surgeon: Malen Gauze, MD;  Location: [facility];  Service: Urology;;  . HOLMIUM LASER APPLICATION Bilateral 08/20/2016   Procedure: HOLMIUM LASER APPLICATION;  Surgeon: Malen Gauze, MD;  Location: [facility];  Service: Urology;  Laterality: Bilateral;  . HOLMIUM LASER APPLICATION Left 01/14/2019   Procedure: HOLMIUM LASER APPLICATION;  Surgeon: Malen Gauze, MD;  Location: [facility];  Service: Urology;  Laterality: Left;  . IR CATHETER TUBE CHANGE  07/04/2018  . IR EXCHANGE BILIARY DRAIN  06/09/2018  . IR EXCHANGE BILIARY DRAIN  10/20/2018  . IR EXCHANGE BILIARY DRAIN  01/12/2019  . IR EXCHANGE BILIARY DRAIN  04/07/2019  . IR PERC CHOLECYSTOSTOMY  05/24/2018  . LAPAROTOMY N/A 08/25/2018   Procedure: EXPLORATORY LAPAROTOMY reduction of volvulus;  Surgeon: [name], MD;  Location: [facility];  Service: General;  Laterality: N/A;  . LAPAROTOMY N/A 05/25/2019   Procedure: EXPLORATORY LAPAROTOMY;  Surgeon: [name], MD;  Location: [facility];  Service: General;  Laterality: N/A;   Family History  Problem Relation Age of Onset  . Gallbladder disease Other    Social History   Socioeconomic History  . Marital status: Single    Spouse name: Not on file  . Number of children: Not on file  . Years of education: Not on file  . Highest education level: Not on file  Occupational History  . Not on file  Tobacco Use  . Smoking status: Never Smoker  . Smokeless tobacco: Never  Used  Vaping Use  . Vaping Use: Never used  Substance and Sexual Activity  . Alcohol use: No  . Drug use: No  . Sexual activity: Never    Birth control/protection: None  Other Topics Concern  . Not on file  Social History Narrative   Patient is delayed lives with family   Social Determinants of Health   Financial Resource Strain: Not on file  Food Insecurity: Not on file  Transportation Needs: Not on file  Physical Activity: Not on file   Stress: Not on file  Social Connections: Not on file  Intimate Partner Violence: Not on file   Prior to Admission medications   Medication Sig Start Date End Date Taking? Authorizing Provider  acetaminophen (TYLENOL) 325 MG tablet Take 650 mg by mouth in the morning, at noon, and at bedtime.   Yes [provider]  aspirin EC 81 MG tablet Take 1 tablet (81 mg total) by mouth daily with breakfast. 05/29/19  Yes Emokpae, Courage, MD  atorvastatin (LIPITOR) 20 MG tablet Take 20 mg by mouth at bedtime. 06/09/19  Yes [provider]  docusate sodium (COLACE) 100 MG capsule Take 100 mg by mouth daily.   Yes [provider]  finasteride (PROSCAR) 5 MG tablet Take 1 tablet (5 mg total) by mouth daily. 05/30/20  Yes McKenzie, Mardene Celeste, MD  magnesium oxide (MAG-OX) 400 MG tablet Take 1 tablet (400 mg total) by mouth daily. 10/23/18  Yes Sharee Holster, NP  metFORMIN (GLUCOPHAGE) 1000 MG tablet Take 1,000 mg by mouth 2 (two) times daily. 04/27/20  Yes [provider]  senna (SENOKOT) 8.6 MG tablet Take 2 tablets by mouth 2 (two) times daily.   Yes [provider]  tamsulosin (FLOMAX) 0.4 MG CAPS capsule Take 1 capsule (0.4 mg total) by mouth in the morning and at bedtime. Patient taking differently: Take 0.4 mg by mouth daily. 05/30/20  Yes McKenzie, Mardene Celeste, MD  traMADol (ULTRAM) 50 MG tablet Take 50 mg by mouth every 8 (eight) hours. 08/18/20  Yes [provider]  bisacodyl (DULCOLAX) 10 MG suppository Place 1 suppository (10 mg total) rectally daily. Patient not taking: No sig reported 03/03/20   Tat, Onalee Hua, MD  FEROSUL 325 (65 Fe) MG tablet Take 325 mg by mouth daily. Patient not taking: No sig reported 04/28/20   [provider]  linaclotide (LINZESS) 145 MCG CAPS capsule Take 145 mcg by mouth daily before breakfast. Patient not taking: No sig reported    [provider]  nitrofurantoin (MACRODANTIN) 50 MG capsule Take 1 capsule (50  mg total) by mouth at bedtime. Patient not taking: No sig reported 05/30/20   Malen Gauze, MD  Potassium Chloride ER 20 MEQ TBCR Take 20 mEq by mouth every Tuesday, Thursday, Saturday, and Sunday. 1 tab daily by mouth Patient not taking: No sig reported 05/30/19   Shon Hale, MD  simethicone (MYLICON) 40 MG/0.6ML drops Take 1.2 mLs (80 mg total) by mouth 4 (four) times daily as needed for flatulence. Patient not taking: No sig reported 04/05/20   Malissa Hippo, MD  vancomycin (VANCOCIN) 125 MG capsule Take 125 mg by mouth every 6 (six) hours. Patient not taking: No sig reported 06/25/20   [provider]

## 2020-08-31 NOTE — Progress Notes (Signed)
Upon arrival, it was noted that pt did not have a peripheral IV. Restarted iv in his left forearm. Pt is unable to follow commands. Pt unable to tolerate transferring to procedure table, prone position. Dr Corliss Skains at bedside. Per Dr Corliss Skains we can do this proceudre under general anesthesia. Will confer with anesthesia about setting up a time for this to be done. This was reported to pt floor RN [place]. Pt transferred back to floor via transporter.

## 2020-09-01 ENCOUNTER — Inpatient Hospital Stay (HOSPITAL_COMMUNITY): Payer: Medicare Other | Admitting: Anesthesiology

## 2020-09-01 ENCOUNTER — Encounter (HOSPITAL_COMMUNITY): Admission: EM | Disposition: E | Payer: Self-pay | Source: Home / Self Care | Attending: Internal Medicine

## 2020-09-01 ENCOUNTER — Inpatient Hospital Stay (HOSPITAL_COMMUNITY): Payer: Medicare Other

## 2020-09-01 ENCOUNTER — Encounter (HOSPITAL_COMMUNITY): Payer: Self-pay | Admitting: Internal Medicine

## 2020-09-01 DIAGNOSIS — M4722 Other spondylosis with radiculopathy, cervical region: Secondary | ICD-10-CM | POA: Diagnosis present

## 2020-09-01 DIAGNOSIS — F79 Unspecified intellectual disabilities: Secondary | ICD-10-CM | POA: Diagnosis not present

## 2020-09-01 DIAGNOSIS — E119 Type 2 diabetes mellitus without complications: Secondary | ICD-10-CM | POA: Diagnosis not present

## 2020-09-01 DIAGNOSIS — I10 Essential (primary) hypertension: Secondary | ICD-10-CM | POA: Diagnosis not present

## 2020-09-01 DIAGNOSIS — R29898 Other symptoms and signs involving the musculoskeletal system: Secondary | ICD-10-CM | POA: Diagnosis not present

## 2020-09-01 DIAGNOSIS — M4712 Other spondylosis with myelopathy, cervical region: Secondary | ICD-10-CM | POA: Diagnosis present

## 2020-09-01 HISTORY — PX: RADIOLOGY WITH ANESTHESIA: SHX6223

## 2020-09-01 HISTORY — PX: ANTERIOR CERVICAL DECOMP/DISCECTOMY FUSION: SHX1161

## 2020-09-01 HISTORY — PX: IR LUMBAR DISC ASPIRATION W/IMG GUIDE: IMG5306

## 2020-09-01 LAB — BASIC METABOLIC PANEL
Anion gap: 9 (ref 5–15)
BUN: 6 mg/dL — ABNORMAL LOW (ref 8–23)
CO2: 24 mmol/L (ref 22–32)
Calcium: 8.5 mg/dL — ABNORMAL LOW (ref 8.9–10.3)
Chloride: 100 mmol/L (ref 98–111)
Creatinine, Ser: 0.78 mg/dL (ref 0.61–1.24)
GFR, Estimated: >60 mL/min (ref >60)
Glucose, Bld: 83 mg/dL (ref 70–99)
Potassium: 3.6 mmol/L (ref 3.5–5.1)
Sodium: 133 mmol/L — ABNORMAL LOW (ref 135–145)

## 2020-09-01 LAB — CBC
HCT: 35.1 % — ABNORMAL LOW (ref 39.0–52.0)
Hemoglobin: 11 g/dL — ABNORMAL LOW (ref 13.0–17.0)
MCH: 27.2 pg (ref 26.0–34.0)
MCHC: 31.3 g/dL (ref 30.0–36.0)
MCV: 86.7 fL (ref 80.0–100.0)
Platelets: 306 10*3/uL (ref 150–400)
RBC: 4.05 MIL/uL — ABNORMAL LOW (ref 4.22–5.81)
RDW: 17.3 % — ABNORMAL HIGH (ref 11.5–15.5)
WBC: 5.8 10*3/uL (ref 4.0–10.5)
nRBC: 0 % (ref 0.0–0.2)

## 2020-09-01 LAB — GLUCOSE, CAPILLARY
Glucose-Capillary: 87 mg/dL (ref 70–99)
Glucose-Capillary: 93 mg/dL (ref 70–99)
Glucose-Capillary: 152 mg/dL — ABNORMAL HIGH (ref 70–99)
Glucose-Capillary: 187 mg/dL — ABNORMAL HIGH (ref 70–99)

## 2020-09-01 SURGERY — ANTERIOR CERVICAL DECOMPRESSION/DISCECTOMY FUSION 2 LEVEL/HARDWARE REMOVAL
Anesthesia: General | Site: Spine Cervical

## 2020-09-01 SURGERY — IR WITH ANESTHESIA
Anesthesia: General

## 2020-09-01 MED ORDER — ROCURONIUM BROMIDE 10 MG/ML (PF) SYRINGE
PREFILLED_SYRINGE | INTRAVENOUS | Status: AC
  Filled 2020-09-01: qty 10

## 2020-09-01 MED ORDER — CEFAZOLIN SODIUM 1 G IJ SOLR
INTRAMUSCULAR | Status: AC
  Filled 2020-09-01: qty 20

## 2020-09-01 MED ORDER — ALBUMIN HUMAN 5 % IV SOLN
12.5000 g | Freq: Once | INTRAVENOUS | Status: AC
  Administered 2020-09-01: 12.5 g via INTRAVENOUS

## 2020-09-01 MED ORDER — ACETAMINOPHEN 325 MG PO TABS: 650.0000 mg | ORAL_TABLET | ORAL | Status: DC | PRN

## 2020-09-01 MED ORDER — ONDANSETRON HCL 4 MG PO TABS: 4.0000 mg | ORAL_TABLET | Freq: Four times a day (QID) | ORAL | Status: DC | PRN

## 2020-09-01 MED ORDER — HYDROCODONE-ACETAMINOPHEN 10-325 MG PO TABS
2.0000 | ORAL_TABLET | ORAL | Status: DC | PRN
  Administered 2020-09-02: 1 via ORAL
  Filled 2020-09-01: qty 2

## 2020-09-01 MED ORDER — HYDROCODONE-ACETAMINOPHEN 7.5-325 MG PO TABS: 1.0000 | ORAL_TABLET | Freq: Four times a day (QID) | ORAL | Status: DC | PRN

## 2020-09-01 MED ORDER — HEMOSTATIC AGENTS (NO CHARGE) OPTIME
TOPICAL | Status: DC | PRN
  Administered 2020-09-01: 1 via TOPICAL

## 2020-09-01 MED ORDER — ONDANSETRON HCL 4 MG/2ML IJ SOLN
INTRAMUSCULAR | Status: AC
  Filled 2020-09-01: qty 2

## 2020-09-01 MED ORDER — LIDOCAINE-EPINEPHRINE 0.5 %-1:200000 IJ SOLN
INTRAMUSCULAR | Status: AC
  Filled 2020-09-01: qty 1

## 2020-09-01 MED ORDER — SODIUM CHLORIDE (PF) 0.9 % IJ SOLN
INTRAMUSCULAR | Status: AC
  Filled 2020-09-01: qty 10

## 2020-09-01 MED ORDER — SODIUM CHLORIDE 0.9 % IV SOLN: 250.0000 mL | INTRAVENOUS | Status: DC

## 2020-09-01 MED ORDER — FENTANYL CITRATE (PF) 250 MCG/5ML IJ SOLN
INTRAMUSCULAR | Status: DC | PRN
  Administered 2020-09-01 (×2): 50 ug via INTRAVENOUS

## 2020-09-01 MED ORDER — LACTATED RINGERS IV SOLN: INTRAVENOUS | Status: DC

## 2020-09-01 MED ORDER — PROPOFOL 10 MG/ML IV BOLUS
INTRAVENOUS | Status: AC
  Filled 2020-09-01: qty 20

## 2020-09-01 MED ORDER — ROCURONIUM BROMIDE 10 MG/ML (PF) SYRINGE
PREFILLED_SYRINGE | INTRAVENOUS | Status: DC | PRN
  Administered 2020-09-01 (×2): 50 mg via INTRAVENOUS

## 2020-09-01 MED ORDER — PHENYLEPHRINE HCL-NACL 10-0.9 MG/250ML-% IV SOLN
INTRAVENOUS | Status: DC | PRN
  Administered 2020-09-01: 35 ug/min via INTRAVENOUS

## 2020-09-01 MED ORDER — SODIUM CHLORIDE 0.9% FLUSH: 3.0000 mL | INTRAVENOUS | Status: DC | PRN

## 2020-09-01 MED ORDER — BUPIVACAINE HCL (PF) 0.5 % IJ SOLN
INTRAMUSCULAR | Status: AC
  Filled 2020-09-01: qty 30

## 2020-09-01 MED ORDER — LIDOCAINE-EPINEPHRINE 0.5 %-1:200000 IJ SOLN
INTRAMUSCULAR | Status: DC | PRN
  Administered 2020-09-01: 4 mL

## 2020-09-01 MED ORDER — DEXAMETHASONE SODIUM PHOSPHATE 10 MG/ML IJ SOLN
INTRAMUSCULAR | Status: DC | PRN
  Administered 2020-09-01: 5 mg via INTRAVENOUS

## 2020-09-01 MED ORDER — THROMBIN 5000 UNITS EX SOLR
CUTANEOUS | Status: AC
  Filled 2020-09-01: qty 15000

## 2020-09-01 MED ORDER — ACETAMINOPHEN 650 MG RE SUPP: 650.0000 mg | RECTAL | Status: DC | PRN

## 2020-09-01 MED ORDER — ALBUMIN HUMAN 5 % IV SOLN
INTRAVENOUS | Status: AC
  Filled 2020-09-01: qty 250

## 2020-09-01 MED ORDER — FENTANYL CITRATE (PF) 250 MCG/5ML IJ SOLN
INTRAMUSCULAR | Status: AC
  Filled 2020-09-01: qty 5

## 2020-09-01 MED ORDER — HYDROMORPHONE HCL 1 MG/ML IJ SOLN
INTRAMUSCULAR | Status: AC
  Filled 2020-09-01: qty 1

## 2020-09-01 MED ORDER — SODIUM CHLORIDE 0.9% FLUSH
3.0000 mL | Freq: Two times a day (BID) | INTRAVENOUS | Status: DC
  Administered 2020-09-02 – 2020-09-07 (×11): 3 mL via INTRAVENOUS

## 2020-09-01 MED ORDER — SUGAMMADEX SODIUM 200 MG/2ML IV SOLN
INTRAVENOUS | Status: DC | PRN
  Administered 2020-09-01: 200 mg via INTRAVENOUS

## 2020-09-01 MED ORDER — PROPOFOL 10 MG/ML IV BOLUS
INTRAVENOUS | Status: DC | PRN
  Administered 2020-09-01: 80 mg via INTRAVENOUS

## 2020-09-01 MED ORDER — MENTHOL 3 MG MT LOZG: 1.0000 | LOZENGE | OROMUCOSAL | Status: DC | PRN

## 2020-09-01 MED ORDER — ALBUMIN HUMAN 5 % IV SOLN: INTRAVENOUS | Status: DC | PRN

## 2020-09-01 MED ORDER — CYCLOBENZAPRINE HCL 10 MG PO TABS
10.0000 mg | ORAL_TABLET | Freq: Three times a day (TID) | ORAL | Status: DC | PRN
  Administered 2020-09-03: 10 mg via ORAL
  Filled 2020-09-01: qty 1

## 2020-09-01 MED ORDER — PHENOL 1.4 % MT LIQD: 1.0000 | OROMUCOSAL | Status: DC | PRN

## 2020-09-01 MED ORDER — ONDANSETRON HCL 4 MG/2ML IJ SOLN: 4.0000 mg | Freq: Four times a day (QID) | INTRAMUSCULAR | Status: DC | PRN

## 2020-09-01 MED ORDER — CEFAZOLIN SODIUM-DEXTROSE 2-3 GM-%(50ML) IV SOLR
INTRAVENOUS | Status: DC | PRN
  Administered 2020-09-01: 2 g via INTRAVENOUS

## 2020-09-01 MED ORDER — POTASSIUM CHLORIDE IN NACL 20-0.9 MEQ/L-% IV SOLN
INTRAVENOUS | Status: DC
  Filled 2020-09-01 (×5): qty 1000

## 2020-09-01 MED ORDER — THROMBIN 5000 UNITS EX SOLR: OROMUCOSAL | Status: DC | PRN

## 2020-09-01 MED ORDER — LIDOCAINE 2% (20 MG/ML) 5 ML SYRINGE
INTRAMUSCULAR | Status: DC | PRN
  Administered 2020-09-01: 60 mg via INTRAVENOUS

## 2020-09-01 MED ORDER — PHENYLEPHRINE 40 MCG/ML (10ML) SYRINGE FOR IV PUSH (FOR BLOOD PRESSURE SUPPORT)
PREFILLED_SYRINGE | INTRAVENOUS | Status: DC | PRN
  Administered 2020-09-01: 120 ug via INTRAVENOUS

## 2020-09-01 MED ORDER — LACTATED RINGERS IV SOLN: INTRAVENOUS | Status: DC | PRN

## 2020-09-01 MED ORDER — LIDOCAINE HCL 1 % IJ SOLN
INTRAMUSCULAR | Status: AC
  Filled 2020-09-01: qty 20

## 2020-09-01 MED ORDER — 0.9 % SODIUM CHLORIDE (POUR BTL) OPTIME
TOPICAL | Status: DC | PRN
  Administered 2020-09-01: 1000 mL

## 2020-09-01 MED ORDER — THROMBIN 5000 UNITS EX SOLR
CUTANEOUS | Status: DC | PRN
  Administered 2020-09-01 (×2): 5000 [IU] via TOPICAL

## 2020-09-01 MED ORDER — HYDROMORPHONE HCL 1 MG/ML IJ SOLN
0.2500 mg | INTRAMUSCULAR | Status: DC | PRN
  Administered 2020-09-01: 0.25 mg via INTRAVENOUS

## 2020-09-01 MED ORDER — EPHEDRINE SULFATE-NACL 50-0.9 MG/10ML-% IV SOSY
PREFILLED_SYRINGE | INTRAVENOUS | Status: DC | PRN
  Administered 2020-09-01 (×3): 5 mg via INTRAVENOUS

## 2020-09-01 MED ORDER — PROMETHAZINE HCL 25 MG/ML IJ SOLN: 6.2500 mg | INTRAMUSCULAR | Status: DC | PRN

## 2020-09-01 SURGICAL SUPPLY — 46 items
ALLOGRAFT 7X14X11 (Bone Implant) ×2 IMPLANT
BAND RUBBER #18 3X1/16 STRL (MISCELLANEOUS) ×4 IMPLANT
BUR DRUM 4.0 (BURR) ×2 IMPLANT
BUR MATCHSTICK NEURO 3.0 LAGG (BURR) ×2 IMPLANT
CANISTER SUCT 3000ML PPV (MISCELLANEOUS) ×2 IMPLANT
CARTRIDGE OIL MAESTRO DRILL (MISCELLANEOUS) ×1 IMPLANT
COVER WAND RF STERILE (DRAPES) IMPLANT
DECANTER SPIKE VIAL GLASS SM (MISCELLANEOUS) ×2 IMPLANT
DERMABOND ADVANCED (GAUZE/BANDAGES/DRESSINGS) ×1
DERMABOND ADVANCED .7 DNX12 (GAUZE/BANDAGES/DRESSINGS) ×1 IMPLANT
DIFFUSER DRILL AIR PNEUMATIC (MISCELLANEOUS) ×2 IMPLANT
DRAPE HALF SHEET 40X57 (DRAPES) IMPLANT
DRAPE LAPAROTOMY 100X72 PEDS (DRAPES) ×2 IMPLANT
DRAPE MICROSCOPE LEICA (MISCELLANEOUS) ×2 IMPLANT
DURAPREP 6ML APPLICATOR 50/CS (WOUND CARE) ×2 IMPLANT
ELECT COATED BLADE 2.86 ST (ELECTRODE) ×2 IMPLANT
ELECT REM PT RETURN 9FT ADLT (ELECTROSURGICAL) ×2
ELECTRODE REM PT RTRN 9FT ADLT (ELECTROSURGICAL) ×1 IMPLANT
GAUZE 4X4 16PLY RFD (DISPOSABLE) IMPLANT
GLOVE ECLIPSE 6.5 STRL STRAW (GLOVE) ×2 IMPLANT
GLOVE EXAM NITRILE XL STR (GLOVE) IMPLANT
GOWN STRL REUS W/ TWL LRG LVL3 (GOWN DISPOSABLE) ×1 IMPLANT
GOWN STRL REUS W/ TWL XL LVL3 (GOWN DISPOSABLE) ×1 IMPLANT
GOWN STRL REUS W/TWL 2XL LVL3 (GOWN DISPOSABLE) IMPLANT
GOWN STRL REUS W/TWL LRG LVL3 (GOWN DISPOSABLE) ×2
GOWN STRL REUS W/TWL XL LVL3 (GOWN DISPOSABLE) ×2
GRAFT CORT CANC 14X8.25X11 5D (Bone Implant) ×2 IMPLANT
KIT BASIN OR (CUSTOM PROCEDURE TRAY) ×2 IMPLANT
KIT TURNOVER KIT B (KITS) ×2 IMPLANT
NEEDLE HYPO 25X1 1.5 SAFETY (NEEDLE) ×2 IMPLANT
NEEDLE SPNL 22GX3.5 QUINCKE BK (NEEDLE) ×2 IMPLANT
NS IRRIG 1000ML POUR BTL (IV SOLUTION) ×2 IMPLANT
OIL CARTRIDGE MAESTRO DRILL (MISCELLANEOUS) ×2
PACK LAMINECTOMY NEURO (CUSTOM PROCEDURE TRAY) ×2 IMPLANT
PAD ARMBOARD 7.5X6 YLW CONV (MISCELLANEOUS) ×10 IMPLANT
PLATE CERV RES 32 2LVL (Plate) ×2 IMPLANT
SCREW BONE VA SD 4.2X13 (Screw) ×2 IMPLANT
SCREW BONE VA ST 4.2X13 (Screw) ×10 IMPLANT
SPONGE INTESTINAL PEANUT (DISPOSABLE) ×2 IMPLANT
SPONGE SURGIFOAM ABS GEL SZ50 (HEMOSTASIS) ×2 IMPLANT
SUT VIC AB 0 CT1 27 (SUTURE)
SUT VIC AB 0 CT1 27XBRD ANTBC (SUTURE) IMPLANT
SUT VIC AB 3-0 SH 8-18 (SUTURE) ×2 IMPLANT
TOWEL GREEN STERILE (TOWEL DISPOSABLE) ×2 IMPLANT
TOWEL GREEN STERILE FF (TOWEL DISPOSABLE) ×2 IMPLANT
WATER STERILE IRR 1000ML POUR (IV SOLUTION) ×2 IMPLANT

## 2020-09-01 NOTE — TOC Initial Note (Signed)
Transition of Care [facility]) - Initial/Assessment Note    Patient Details  Name: [name] MRN: [id number] Date of Birth: 1951-03-23  Transition of Care [facility]) CM/SW Contact:    Epifanio Lesches, RN Phone Number: 09/28/20, 7:23 AM  Clinical Narrative:                 Presents from [facility] with abnormal MRI, LE weakness and urinary retention. Hx of  diabetes mellitus, hypertension, urinary retention, mentally challenged.    Plan: image-guided L3-4 disc aspiration in IR  2020/09/28  PT/OT evaluations pending.....  Otelia Sergeant [place])     [phone]      [facility] team monitoring and will assist with [facility] needs  Expected Discharge Plan: Skilled Nursing Facility Barriers to Discharge: Continued Medical Work up   Patient Goals and CMS Choice        Expected Discharge Plan and Services Expected Discharge Plan: Skilled Nursing Facility                                              Prior Living Arrangements/Services                       Activities of Daily Living   ADL Screening (condition at time of admission) Patient's cognitive ability adequate to safely complete daily activities?: Yes Is the patient deaf or have difficulty hearing?: No Does the patient have difficulty seeing, even when wearing glasses/contacts?: No Does the patient have difficulty concentrating, remembering, or making decisions?: Yes Patient able to express need for assistance with ADLs?: Yes Does the patient have difficulty dressing or bathing?: Yes Independently performs ADLs?: No Does the patient have difficulty walking or climbing stairs?: Yes Weakness of Legs: Both (due to cervical spine stenosis per neurosurgeon) Weakness of Arms/Hands: None  Permission Sought/Granted                  Emotional Assessment              Admission diagnosis:  Lower extremity weakness [R29.898] Weakness of both lower extremities [R29.898] Patient Active Problem List    Diagnosis Date Noted  . Lower extremity weakness 08/28/2020  . SIRS (systemic inflammatory response syndrome) (HCC) 06/15/2020  . Goals of care, counseling/discussion   . Encounter for hospice care discussion   . COVID-19 virus infection 02/28/2020  . Palliative care by specialist   . Pneumoperitoneum 07/04/2019  . Intra-abdominal free air of unknown etiology 07/04/2019  . Cystitis   . Chronic cholecystitis 05/21/2019  . Chronic constipation 03/31/2019  . Cholecystostomy care (HCC) 11/04/2018  . Acute lower UTI 09/18/2018  . Abdominal distention 09/18/2018  . Protein-calorie malnutrition, severe (HCC) 09/09/2018  . Acute blood loss anemia 09/09/2018  . Gangrenous cholecystitis 09/05/2018  . Pneumatosis intestinalis   . Gall bladder disease 08/25/2018  . Small bowel ischemia (HCC)   . Acute gangrenous cholecystitis 05/24/2018  . SBO (small bowel obstruction) (HCC) 05/24/2018  . Abdominal distension   . Pressure injury of skin 04/09/2018  . C. difficile diarrhea 04/08/2018  . Bilateral hydronephrosis 04/08/2018  . Pneumoperitoneum of unknown etiology   . Diarrhea 04/07/2018  . Nonverbal 04/07/2018  . Hydronephrosis with urinary obstruction due to ureteral calculus 06/20/2016  . Sepsis secondary to UTI (HCC) 06/20/2016  . Fever 06/19/2016  . Generalized weakness 06/19/2016  . Diabetes mellitus type  2, noninsulin dependent (HCC) 06/19/2016  . Severe sepsis with septic shock (HCC) 06/19/2016  . UTI (urinary tract infection) 06/19/2016  . Hematuria 06/19/2016  . AKI (acute kidney injury) (HCC) 06/19/2016  . Hyponatremia 06/19/2016  . Hypokalemia 06/19/2016  . BPH (benign prostatic hyperplasia) 06/19/2016  . Urine retention 05/07/2014  . Intellectual disability 05/07/2014  . Hyperlipidemia 05/07/2014  . HTN (hypertension) 05/07/2014  . Renal calculus 01/14/2012   PCP:  Pearson Grippe, MD Pharmacy:   [facility] [id number] - [place], [place] - [address] AT [facility] [address] [place] [address] Phone: [phone] Fax: [phone]     Social Determinants of Health (SDOH) Interventions    Readmission Risk Interventions Readmission Risk Prevention Plan 05/26/2019 09/20/2018 09/19/2018  Transportation Screening Complete Complete Complete  PCP or Specialist Appt within 3-5 Days Not Complete - -  HRI or Home Care Consult Complete - -  Social Work Consult for Recovery Care Planning/Counseling Complete - -  Palliative Care Screening Not Complete - -  Medication Review Oceanographer) Complete Complete Complete  PCP or Specialist appointment within 3-5 days of discharge - Complete Complete  PCP/Specialist Appt Not Complete comments - - SNF resident. Sees facility MD  HRI or Home Care Consult - Not Complete -  HRI or Home Care Consult Pt Refusal Comments - Patient is discharging to SNF for rehab -  SW Recovery Care/Counseling Consult - Complete -  Palliative Care Screening - Not Applicable -  Skilled Nursing Facility - Complete -  Some recent data might be hidden

## 2020-09-01 NOTE — Anesthesia Preprocedure Evaluation (Addendum)
Anesthesia Evaluation  Patient identified by MRN, date of birth, ID band Patient confused    Reviewed: Allergy & Precautions, H&P , NPO status , Patient's Chart, lab work & pertinent test results  Airway Mallampati: III  TM Distance: >3 FB Neck ROM: Full    Dental no notable dental hx. (+) Edentulous Upper, Edentulous Lower, Dental Advisory Given   Pulmonary neg pulmonary ROS,    Pulmonary exam normal breath sounds clear to auscultation       Cardiovascular hypertension,  Rhythm:Regular Rate:Normal     Neuro/Psych negative neurological ROS  negative psych ROS   GI/Hepatic negative GI ROS, Neg liver ROS,   Endo/Other  diabetes, Type 2, Oral Hypoglycemic Agents  Renal/GU negative Renal ROS  negative genitourinary   Musculoskeletal   Abdominal   Peds  Hematology  (+) Blood dyscrasia, anemia ,   Anesthesia Other Findings   Reproductive/Obstetrics negative OB ROS                            Anesthesia Physical Anesthesia Plan  ASA: III  Anesthesia Plan: General   Post-op Pain Management:    Induction: Intravenous  PONV Risk Score and Plan: 3 and Ondansetron, Dexamethasone and Treatment may vary due to age or medical condition  Airway Management Planned: Oral ETT  Additional Equipment:   Intra-op Plan:   Post-operative Plan: Extubation in OR  Informed Consent: I have reviewed the patients History and Physical, chart, labs and discussed the procedure including the risks, benefits and alternatives for the proposed anesthesia with the patient or authorized representative who has indicated his/her understanding and acceptance.     Dental advisory given and Consent reviewed with POA  Plan Discussed with: CRNA  Anesthesia Plan Comments:        Anesthesia Quick Evaluation

## 2020-09-01 NOTE — Anesthesia Preprocedure Evaluation (Deleted)
Anesthesia Evaluation    Reviewed: Allergy & Precautions, Patient's Chart, lab work & pertinent test results  History of Anesthesia Complications Negative for: history of anesthetic complications  Airway        Dental   Pulmonary neg pulmonary ROS,           Cardiovascular hypertension,      Neuro/Psych Intellectual disability, cervical stenosis with myelopathy    GI/Hepatic negative GI ROS, Neg liver ROS,   Endo/Other  diabetes, Type 2, Oral Hypoglycemic Agents  Renal/GU negative Renal ROS  negative genitourinary   Musculoskeletal negative musculoskeletal ROS (+)   Abdominal   Peds  Hematology  (+) anemia , Hgb 11.0   Anesthesia Other Findings Day of surgery medications reviewed with patient.  Reproductive/Obstetrics negative OB ROS                             Anesthesia Physical Anesthesia Plan  ASA: II  Anesthesia Plan: General   Post-op Pain Management:    Induction: Intravenous  PONV Risk Score and Plan: 3 and Treatment may vary due to age or medical condition, Ondansetron, Dexamethasone and Midazolam  Airway Management Planned: Oral ETT  Additional Equipment: None  Intra-op Plan:   Post-operative Plan: Extubation in OR  Informed Consent:   Plan Discussed with:   Anesthesia Plan Comments:         Anesthesia Quick Evaluation

## 2020-09-01 NOTE — Progress Notes (Signed)
Patient ID: [name], male   DOB: [birth year], 70 y.o.   MRN: [id number] BP 138/70 (BP Location: Right Arm)   Pulse 88   Temp 98.6 F (37 C) (Oral)   Resp 18   Ht 6' 0.01" (1.829 m)   Wt 53.9 kg   SpO2 99%   BMI 16.11 kg/m  I have spoken with his Aunt today in person. She reports that he had positive blood cultures I believe in March, 22. He also had a UTI at that time. He completed a course of Levaquin at that time. The UTI positive for Klebsiella. He had also received rocephin for 10 days IV for e coli and Klebsiella, and vancomycin for c dificile. Difficulty walking has been noted since early march, along with generalized weakness. Lumbar tenderness also noted on 06/29/2020. He was Covid 19+ during his admission on 06/15/20. He was discharged on 06/25/20. I explained my rationale for recommending the ACDF at C3/4,4/5. Risks and benefits including but not limited to bleeding, infection, paralysis, spinal cord damage, damage to vocal cords, weakness in extremities, stroke, fusion failure and hardware failure. She understands and wishes to proceed.  He is moving all extremities today

## 2020-09-01 NOTE — Op Note (Signed)
September 24, 2020  7:21 PM  PATIENT:  [name]  70 y.o. male with cervical cord compression at C3/4,4/5.   PRE-OPERATIVE DIAGNOSIS:  cervical stenosis with myelopathy C3/4,4/5  POST-OPERATIVE DIAGNOSIS:  cervical stenosis with myelopathy C3/4,4/5  PROCEDURE:  Anterior Cervical decompression C3-5 Arthrodesis C3/4,4/5 with 7, and 71mm structural allografts Anterior instrumentation(Globus resonate) C3-5  SURGEON:   Surgeon(s): Coletta Memos, MD   ASSISTANTS:none  ANESTHESIA:   general  EBL:  No intake/output data recorded.  BLOOD ADMINISTERED:none  CELL SAVER GIVEN:none  COUNT:per nursing  DRAINS: none   SPECIMEN:  No Specimen  DICTATION: [name] was taken to the operating room intubated and remained under general anesthesia after transfer from the interventional radiology suite. He was positioned supine with his head in slight extension on a horseshoe headrest. The neck was prepped and draped in a sterile manner. I infiltrated 4 cc's 1/2%lidocaine/1:200,000 strength epinephrine into the planned incision starting from the midline to the medial border of the left sternocleidomastoid muscle. I opened the incision with a 10 blade and dissected sharply through soft tissue to the platysma. I dissected in the plane superior to the platysma both rostrally and caudally. I then opened the platysma in a horizontal fashion with Metzenbaum scissors, and dissected in the inferior plane rostrally and caudally. With both blunt and sharp technique I created an avascular corridor to the cervical spine. I placed a spinal needle(s) in the disc space at 4/5 . I then reflected the longus colli from C3 to C5 and placed self retaining retractors. I opened the disc space(s) at 3/4,and 4/5 with a 15 blade. I removed disc with curettes, Kerrison punches, and the drill. Using the drill I removed osteophytes and prepared for the decompression.  I decompressed the spinal canal and the C4, and 5 root(s) with the  drill, Kerrison punches, and the curettes. I used the microscope to aid in microdissection. I removed the posterior longitudinal ligament to fully expose and decompress the thecal sac. I exposed the roots laterally taking down the  uncovertebral joints. With the decompression complete I moved on to the arthrodesis. I used the drill to level the surfaces of C3,4,and 5. I removed soft tissue to prepare the disc space and the bony surfaces. I measured the space and placed a 10mm structural allograft into the disc space at 4/5, and 90mm at 3/4.  I then placed the anterior instrumentation. I placed 2 screws in each vertebral body through the plate. I locked the screws into place. Intraoperative xray showed the graft, plate, and screws to be in good position. I irrigated the wound, achieved hemostasis, and closed the wound in layers. I approximated the platysma, and the subcuticular plane with vicryl sutures. I used Dermabond for a sterile dressing.   PLAN OF CARE: Admit to inpatient   PATIENT DISPOSITION:  PACU - hemodynamically stable.   Delay start of Pharmacological VTE agent (>24hrs) due to surgical blood loss or risk of bleeding:  yes

## 2020-09-01 NOTE — H&P (Signed)
Interventional Radiology   [name] is a 70 year old male with past medical history of UTI and bacteremia, now with concern for discitis.  IR consulted for L3-L4 disc aspiration.  This was planned with moderate sedation, however patient was unable to tolerate attempt yesterday.  He has been scheduled for re-attempt today with assistance from anesthesia team.   He is assessed in short stay alongside Dr. Corliss Skains and his aunt who is his POA.   There are no new medical concerns overnight.  He is alert, stable, non-conversant which is his baseline.   Labs and medications reviewed.   Aunt consented for procedure again today.   Loyce Dys, MS RD PA-C 12:20 PM

## 2020-09-01 NOTE — Anesthesia Procedure Notes (Signed)
Procedure Name: Intubation Date/Time: 09/04/2020 2:01 PM Performed by: Lanell Matar, CRNA Pre-anesthesia Checklist: Patient identified, Emergency Drugs available, Suction available and Patient being monitored Patient Re-evaluated:Patient Re-evaluated prior to induction Oxygen Delivery Method: Circle System Utilized Preoxygenation: Pre-oxygenation with 100% oxygen Induction Type: IV induction Ventilation: Mask ventilation without difficulty Laryngoscope Size: Miller and 2 Grade View: Grade I Tube type: Oral Tube size: 8.0 mm Number of attempts: 1 Airway Equipment and Method: Stylet and Oral airway Placement Confirmation: ETT inserted through vocal cords under direct vision,  positive ETCO2 and breath sounds checked- equal and bilateral Secured at: 22 cm Tube secured with: Tape Dental Injury: Teeth and Oropharynx as per pre-operative assessment

## 2020-09-01 NOTE — Progress Notes (Signed)
PROGRESS NOTE    DEMARIS LEAVELL  HCW:[id number] DOB: 1950/11/27 DOA: 08/16/2020 PCP: Pearson Grippe, MD   Brief Narrative: JAMIR RONE is a 70 y.o. male with a history of intellectual disability, diabetes mellitus.  Patient presented from a nursing home secondary bilateral lower extremity weakness, urinary retention and abnormal MRI.  Patient was found to have an L3-L4 disc space edema concerning for possible discitis/osteomyelitis versus degenerative edema.  Neurosurgery was consulted on admission who recommended IR consult for aspiration.  Blood cultures obtained on admission.  Antibiotics held secondary to stable patient.   Assessment & Plan:   Principal Problem:   Lower extremity weakness Active Problems:   Urine retention   Intellectual disability   HTN (hypertension)   Diabetes mellitus type 2, noninsulin dependent (HCC)   Nonverbal   Severe spinal stenosis C3/4 and C4/5 with cord compression Bilateral LE weakness - Neurosurgery following, Dr. Franky Macho consulted on admission - Plan for IR evaluation today/lumbar aspiration then proceed to the operating room for the ACDF  Urinary retention, chronic - Known history of BPH and chronic Foley use.   - Patient presented to the hospital with a Foley catheter already in place.  Lactic acidosis -Likely in setting of hypovolemia, continue IV fluids  Diabetes mellitus, type 2 Hemoglobin A1C of 7.1%. On metformin as an outpatient. Started on SSI inpatient. Follow hypoglycemia protocol  Hyperlipidemia -Continue Lipitor  BPH Patient is on Flomax and finasteride as an outpatient -Continue finasteride and Flomax  Intellectual disability Non-verbal at baseline Does not appear to follow simple commands  DVT prophylaxis: SCDs Code Status:   Code Status: Full Code Family Communication: None at bedside. Aunt on telephone Disposition Plan: Pending surgical intervention, likely discharge back to facility once medically stable in  the next 3 to 4 days   Consultants:   Neurosurgery  Interventional radiology  Procedures:   None  Antimicrobials:  None   Subjective: Non-verbal.  No acute issues or events overnight per staff  Objective: Vitals:   08/31/20 0306 08/31/20 0700 08/31/20 2004 Sep 17, 2020 0407  BP: 116/63 (!) 126/58 132/61 127/64  Pulse: 87 91 88 81  Resp: 16 16 16 17   Temp: 98.5 F (36.9 C) 98.1 F (36.7 C) 98.6 F (37 C) 98.8 F (37.1 C)  TempSrc: Oral Oral Oral Oral  SpO2: 100% 100% 99% 99%  Weight:      Height:       No intake or output data in the 24 hours ending 09/17/20 0724 Filed Weights   09/06/2020 1451 08/30/20 0201  Weight: 72.6 kg 53.9 kg    Examination:  General exam: Appears calm and comfortable Respiratory system: Clear to auscultation. Respiratory effort normal. Cardiovascular system: S1 & S2 heard, RRR. No murmurs, rubs, gallops or clicks. Gastrointestinal system: Abdomen is nondistended, soft and nontender. No organomegaly or masses felt. Normal bowel sounds heard. Central nervous system: Awake alert, unable to assess orientation, does not follow simple commands.  4/5 strength globally based on limited exam Musculoskeletal: No edema. No calf tenderness Skin: No cyanosis. No rashes   Data Reviewed: I have personally reviewed following labs and imaging studies  CBC Lab Results  Component Value Date   WBC 5.8 2020-09-17   RBC 4.05 (L) 09/17/20   HGB 11.0 (L) 09/17/2020   HCT 35.1 (L) 17-Sep-2020   MCV 86.7 09-17-20   MCH 27.2 09-17-2020   PLT 306 09-17-20   MCHC 31.3 17-Sep-2020   RDW 17.3 (H) 2020-09-17   LYMPHSABS 0.7 09/13/2020  MONOABS 0.5 09/10/2020   EOSABS 0.0 09/10/2020   BASOSABS 0.0 2020-09-10     Last metabolic panel Lab Results  Component Value Date   NA 133 (L) 08/16/2020   K 3.6 09/11/2020   CL 100 09/02/2020   CO2 24 08/25/2020   BUN 6 (L) 09/12/2020   CREATININE 0.78 09/12/2020   GLUCOSE 83 08/30/2020   GFRNONAA >60  08/25/2020   GFRAA >60 07/07/2019   CALCIUM 8.5 (L) 08/22/2020   PHOS 2.6 03/03/2020   PROT 7.5 09-10-20   ALBUMIN 2.9 (L) 09/10/20   BILITOT 0.5 2020-09-10   ALKPHOS 105 09-10-20   AST 16 09/10/2020   ALT 13 09-10-20   ANIONGAP 9 09/12/2020    CBG (last 3)  Recent Labs    08/31/20 1741 08/31/20 2349 09/11/2020 0627  GLUCAP 73 114* 87     GFR: Estimated Creatinine Clearance: 65.5 mL/min (by C-G formula based on SCr of 0.78 mg/dL).  Coagulation Profile: Recent Labs  Lab 08/30/20 1031  INR 1.1    Recent Results (from the past 240 hour(s))  Culture, blood (routine x 2)     Status: None (Preliminary result)   Collection Time: 2020/09/10  4:03 PM   Specimen: BLOOD RIGHT ARM  Result Value Ref Range Status   Specimen Description   Final    BLOOD RIGHT ARM Performed at [facility], [address] [place], [place] [address]    Special Requests   Final    BOTTLES DRAWN AEROBIC AND ANAEROBIC Blood Culture adequate volume Performed at [facility], [address] [place], [place] [address]    Culture   Final    NO GROWTH 2 DAYS Performed at [facility], [address] [place], [place] [address]    Report Status PENDING  Incomplete  Culture, blood (routine x 2)     Status: None (Preliminary result)   Collection Time: Sep 10, 2020  4:03 PM   Specimen: BLOOD RIGHT HAND  Result Value Ref Range Status   Specimen Description   Final    BLOOD RIGHT HAND Performed at [facility], [address], [place], [place] [address]    Special Requests   Final    BOTTLES DRAWN AEROBIC AND ANAEROBIC Blood Culture adequate volume Performed at [facility], [address], [place], [place] [address]    Culture   Final    NO GROWTH 2 DAYS Performed at [facility], [address], [place], [place] [address]    Report Status PENDING  Incomplete  Resp Panel by RT-PCR (Flu A&B, Covid) Nasopharyngeal Swab     Status: None   Collection Time: 10-Sep-2020  6:05  PM   Specimen: Nasopharyngeal Swab; Nasopharyngeal(NP) swabs in vial transport medium  Result Value Ref Range Status   SARS Coronavirus 2 by RT PCR NEGATIVE NEGATIVE Final    Comment: (NOTE) SARS-CoV-2 target nucleic acids are NOT DETECTED.  The SARS-CoV-2 RNA is generally detectable in upper respiratory specimens during the acute phase of infection. The lowest concentration of SARS-CoV-2 viral copies this assay can detect is 138 copies/mL. A negative result does not preclude SARS-Cov-2 infection and should not be used as the sole basis for treatment or other patient management decisions. A negative result may occur with  improper specimen collection/handling, submission of specimen other than nasopharyngeal swab, presence of viral mutation(s) within the areas targeted by this assay, and inadequate number of viral copies(<138 copies/mL). A negative result must be combined with clinical observations, patient history, and  epidemiological information. The expected result is Negative.  Fact Sheet for Patients:  BloggerCourse.com  Fact Sheet for Healthcare Providers:  SeriousBroker.it  This test is no t yet approved or cleared by the [place] FDA and  has been authorized for detection and/or diagnosis of SARS-CoV-2 by FDA under an Emergency Use Authorization (EUA). This EUA will remain  in effect (meaning this test can be used) for the duration of the COVID-19 declaration under Section 564(b)(1) of the Act, 21 U.S.C.section 360bbb-3(b)(1), unless the authorization is terminated  or revoked sooner.       Influenza A by PCR NEGATIVE NEGATIVE Final   Influenza B by PCR NEGATIVE NEGATIVE Final    Comment: (NOTE) The Xpert Xpress SARS-CoV-2/FLU/RSV plus assay is intended as an aid in the diagnosis of influenza from Nasopharyngeal swab specimens and should not be used as a sole basis for treatment. Nasal washings and aspirates are  unacceptable for Xpert Xpress SARS-CoV-2/FLU/RSV testing.  Fact Sheet for Patients: BloggerCourse.com  Fact Sheet for Healthcare Providers: SeriousBroker.it  This test is not yet approved or cleared by the [place] FDA and has been authorized for detection and/or diagnosis of SARS-CoV-2 by FDA under an Emergency Use Authorization (EUA). This EUA will remain in effect (meaning this test can be used) for the duration of the COVID-19 declaration under Section 564(b)(1) of the Act, 21 U.S.C. section 360bbb-3(b)(1), unless the authorization is terminated or revoked.  Performed at [facility], [address], [place], [place] [address]   Surgical PCR screen     Status: None   Collection Time: 08/31/20 11:36 AM   Specimen: Nasal Mucosa; Nasal Swab  Result Value Ref Range Status   MRSA, PCR NEGATIVE NEGATIVE Final   Staphylococcus aureus NEGATIVE NEGATIVE Final    Comment: (NOTE) The Xpert SA Assay (FDA approved for NASAL specimens in patients 28 years of age and older), is one component of a comprehensive surveillance program. It is not intended to diagnose infection nor to guide or monitor treatment. Performed at [facility], [address] [place], [place] [address]         Radiology Studies: No results found.      Scheduled Meds: . atorvastatin  20 mg Oral QHS  . Chlorhexidine Gluconate Cloth  6 each Topical Daily  . docusate sodium  100 mg Oral Daily  . finasteride  5 mg Oral Daily  . insulin aspart  0-9 Units Subcutaneous Q6H  . magnesium oxide  400 mg Oral Daily  . senna  2 tablet Oral BID  . tamsulosin  0.4 mg Oral Daily    LOS: 3 days   Carma Leaven, DO [facility] 09-12-20, 7:24 AM  If 7PM-7AM, please contact night-coverage [email or web address]

## 2020-09-01 NOTE — Procedures (Signed)
S/P L3-L4 fluoroguided aspirations x 2 passes. x1 with 21G and x1 with 20 G needles.  First pass approx 1 ccof thick aspirate otained. Second pass approx 4 cc of bloody aspirate obtained. Vital stable. Sample sent for analysis S.Zael Shuman MD

## 2020-09-01 NOTE — Transfer of Care (Signed)
Immediate Anesthesia Transfer of Care Note  Patient: [name]  Procedure(s) Performed: IR WITH ANESTHESIA (N/A ) ANTERIOR CERVICAL DISCECTOMY FUSION CERVICAL THREE-FOUR AND  ,CERVICAL  FOUR -FIVE (N/A Spine Cervical)  Patient Location: PACU  Anesthesia Type:General  Level of Consciousness: awake, alert, patient non-verbal  Airway & Oxygen Therapy: Patient Spontanous Breathing and Patient connected to face mask oxygen  Post-op Assessment: Report given to RN and Post -op Vital signs reviewed and stable  Post vital signs: Reviewed and stable  Last Vitals:  Vitals Value Taken Time  BP 106/54 08/16/2020 1816  Temp    Pulse 101 09/12/2020 1818  Resp 14 08/15/2020 1818  SpO2 100 % 09/10/2020 1818  Vitals shown include unvalidated device data.  Last Pain:  Vitals:   09/05/2020 0900  TempSrc: Oral  PainSc:          Complications: No complications documented.

## 2020-09-02 ENCOUNTER — Encounter (HOSPITAL_COMMUNITY): Payer: Self-pay | Admitting: Interventional Radiology

## 2020-09-02 DIAGNOSIS — I10 Essential (primary) hypertension: Secondary | ICD-10-CM | POA: Diagnosis not present

## 2020-09-02 DIAGNOSIS — R29898 Other symptoms and signs involving the musculoskeletal system: Secondary | ICD-10-CM | POA: Diagnosis not present

## 2020-09-02 DIAGNOSIS — E119 Type 2 diabetes mellitus without complications: Secondary | ICD-10-CM | POA: Diagnosis not present

## 2020-09-02 DIAGNOSIS — F79 Unspecified intellectual disabilities: Secondary | ICD-10-CM | POA: Diagnosis not present

## 2020-09-02 LAB — CBC
HCT: 30.4 % — ABNORMAL LOW (ref 39.0–52.0)
Hemoglobin: 9.5 g/dL — ABNORMAL LOW (ref 13.0–17.0)
MCH: 27.3 pg (ref 26.0–34.0)
MCHC: 31.3 g/dL (ref 30.0–36.0)
MCV: 87.4 fL (ref 80.0–100.0)
Platelets: 247 10*3/uL (ref 150–400)
RBC: 3.48 MIL/uL — ABNORMAL LOW (ref 4.22–5.81)
RDW: 17.4 % — ABNORMAL HIGH (ref 11.5–15.5)
WBC: 7.2 10*3/uL (ref 4.0–10.5)
nRBC: 0 % (ref 0.0–0.2)

## 2020-09-02 LAB — BASIC METABOLIC PANEL
Anion gap: 13 (ref 5–15)
BUN: 7 mg/dL — ABNORMAL LOW (ref 8–23)
CO2: 21 mmol/L — ABNORMAL LOW (ref 22–32)
Calcium: 8.6 mg/dL — ABNORMAL LOW (ref 8.9–10.3)
Chloride: 99 mmol/L (ref 98–111)
Creatinine, Ser: 0.79 mg/dL (ref 0.61–1.24)
GFR, Estimated: >60 mL/min (ref >60)
Glucose, Bld: 172 mg/dL — ABNORMAL HIGH (ref 70–99)
Potassium: 3.9 mmol/L (ref 3.5–5.1)
Sodium: 133 mmol/L — ABNORMAL LOW (ref 135–145)

## 2020-09-02 LAB — GLUCOSE, CAPILLARY
Glucose-Capillary: 131 mg/dL — ABNORMAL HIGH (ref 70–99)
Glucose-Capillary: 104 mg/dL — ABNORMAL HIGH (ref 70–99)
Glucose-Capillary: 136 mg/dL — ABNORMAL HIGH (ref 70–99)

## 2020-09-02 MED ORDER — HYDROCODONE-ACETAMINOPHEN 5-325 MG PO TABS
1.0000 | ORAL_TABLET | ORAL | Status: DC | PRN
  Administered 2020-09-02 – 2020-09-06 (×4): 2 via ORAL
  Filled 2020-09-02 (×4): qty 2

## 2020-09-02 NOTE — Progress Notes (Signed)
Called by pt's bedside nurse, in her assessments and in the patient's aunt's assessment, pt is having increased LUE weakness / clumsiness compared to preop. Pt seen and evaluated. He is non-verbal and FC poorly. But he does appear full strength in the RUE, the BLE are at least 4-/5 with reflexes 1+ on the R and 2+ on the L. The LUE is clumsy, he is unable to grasp objects, strength is 4/5 diffusely - definitely weaker than the RUE but difficult to grade without him able to cooperate, +Hoffman's on L, none on R.  -given that his primary caretaker is quite sure he is worse than preop, will order a stat MRI C-spine to evaluate for EDH

## 2020-09-02 NOTE — Anesthesia Postprocedure Evaluation (Signed)
Anesthesia Post Note  Patient: [name]  Procedure(s) Performed: IR WITH ANESTHESIA (N/A ) ANTERIOR CERVICAL DISCECTOMY FUSION CERVICAL THREE-FOUR AND  ,CERVICAL  FOUR -FIVE (N/A Spine Cervical)     Patient location during evaluation: Other Anesthesia Type: General Level of consciousness: awake and alert Pain management: pain level controlled Vital Signs Assessment: post-procedure vital signs reviewed and stable Respiratory status: spontaneous breathing, nonlabored ventilation and respiratory function stable Cardiovascular status: blood pressure returned to baseline and stable Postop Assessment: no apparent nausea or vomiting Anesthetic complications: no   No complications documented.  Last Vitals:  Vitals:   09/02/20 0336 09/02/20 0742  BP: 111/62 (!) 110/52  Pulse: 93 91  Resp: 18 18  Temp: (!) 36.3 C 36.7 C  SpO2: 100% 100%    Last Pain:  Vitals:   09/02/20 0336  TempSrc: Oral  PainSc:                  Neidra Girvan,W. EDMOND

## 2020-09-02 NOTE — TOC Progression Note (Signed)
Transition of Care [facility]) - Progression Note    Patient Details  Name: [name] MRN: [id number] Date of Birth: 1950-12-17  Transition of Care [facility] CM/SW Contact  Epifanio Lesches, RN Phone Number: 09/02/2020, 1:22 PM  Clinical Narrative:    NCM spoke with [name]) in regard to d/c planning. NCM shared PT/OT evaluations / recommendations.  Aunt stated she would like pt to return to [facility] to receive rehab. States pt is a LT resident @ [address].  NCM spoke with [facility] admission director Eunice Blase and Eunice Blase confirmed SNF bed will be available for pt @ d/c. Eunice Blase stated if d/c over the week please call her @ [phone].   TOC team will continue to monitor and assist with TOC needs....  Expected Discharge Plan: Skilled Nursing Facility Barriers to Discharge: Continued Medical Work up  Expected Discharge Plan and Services Expected Discharge Plan: Skilled Nursing Facility                                               Social Determinants of Health (SDOH) Interventions    Readmission Risk Interventions Readmission Risk Prevention Plan 05/26/2019 09/20/2018 09/19/2018  Transportation Screening Complete Complete Complete  PCP or Specialist Appt within 3-5 Days Not Complete - -  HRI or Home Care Consult Complete - -  Social Work Consult for Recovery Care Planning/Counseling Complete - -  Palliative Care Screening Not Complete - -  Medication Review [employer]) Complete Complete Complete  PCP or Specialist appointment within 3-5 days of discharge - Complete Complete  PCP/Specialist Appt Not Complete comments - - SNF resident. Sees facility MD  HRI or Home Care Consult - Not Complete -  HRI or Home Care Consult Pt Refusal Comments - Patient is discharging to SNF for rehab -  SW Recovery Care/Counseling Consult - Complete -  Palliative Care Screening - Not Applicable -  Skilled Nursing Facility - Complete -  Some recent data might be  hidden

## 2020-09-02 NOTE — Evaluation (Signed)
Occupational Therapy Evaluation Patient Details Name: [name] MRN: [id number] DOB: [birth year] Today's Date: 09/02/2020    History of Present Illness [name] is a 70 y.o. male with medical history significant for intellectual disability, diabetes mellitus, hypertension.  Patient was sent to the ED from nursing home with reports of abnormal MRI.  History is mostly obtained from chart review as patient's is unable to communicate due to baseline intellectual disability.  Over the past month, patient has been having bilateral lower extremity weakness, with urinary retention.  At the of my evaluation he shakes his head - No to any other complaints.     Patient had thoracic and lumbar MRI 08/25/20.  With lumbar MRI showing abnormal appearance of L3-L4 disc space with prominent edema in the L3-L4 vertebral bodies and possible small endplate erosions but no significant paravertebral soft tissue inflammation.  Findings indeterminate for infectious discitis-osteomyelitis versus prominent degenerative edema.  CT cervical spine shows stenosis. S/P ACDF at C3/4, 4/5 09-25-2020 as well as L3-4 disc aspiration.   Clinical Impression   Pt admitted with the above diagnosis and has the deficits listed below. Pt would benefit from cont OT to increase independence with adls back to his baseline now that surgery has been completed. Pt was able to dress self, toilet and feed self with little to no assist before deficits.  Pt lives with his aunt. At this time, pt requires too much assist to d/c home with her and feel he will benefit from cont therapy at SNF level.  Will continue to see and encourage pt with independence with adls.     Follow Up Recommendations  Supervision/Assistance - 24 hour;SNF    Equipment Recommendations  Other (comment) (tbd)    Recommendations for Other Services       Precautions / Restrictions Precautions Precautions: Fall Precaution Comments: Pt with very little movement in LEs  and little effort to assist with mobiltiy. Restrictions Weight Bearing Restrictions: No      Mobility Bed Mobility Overal bed mobility: Needs Assistance Bed Mobility: Supine to Sit     Supine to sit: Max assist;+2 for physical assistance     General bed mobility comments: Pt requires total assist to move forward on the bed and to come to full sit. Pt did initiate movement to come to EOB.    Transfers Overall transfer level: Needs assistance Equipment used: None Transfers: Squat Pivot Transfers     Squat pivot transfers: Total assist;+2 physical assistance     General transfer comment: attempted to assist patient to stand using RW and max +2 assist. Patient unable to clear bottom from bed.    Balance Overall balance assessment: Needs assistance Sitting-balance support: Feet supported Sitting balance-[name]: Fair Sitting balance - Comments: supervision for safety     Standing balance-[name]: Zero Standing balance comment: unable to fully bear weigth through legs                           ADL either performed or assessed with clinical judgement   ADL Overall ADL's : Needs assistance/impaired Eating/Feeding: Minimal assistance;Sitting;Cueing for safety Eating/Feeding Details (indicate cue type and reason): cuing to slow down. Food cut up for pt into smaller bites.  Opened containers for pt. Grooming: Maximal assistance   Upper Body Bathing: Maximal assistance   Lower Body Bathing: Total assistance   Upper Body Dressing : Moderate assistance;Sitting   Lower Body Dressing: Total assistance;+2 for physical assistance;Sitting/lateral  leans   Toilet Transfer: Total assistance;+2 for physical assistance;Requires drop arm;BSC   Toileting- Clothing Manipulation and Hygiene: Total assistance;+2 for physical assistance;Sitting/lateral lean       Functional mobility during ADLs: Total assistance;+2 for physical assistance General ADL Comments: Pt  unable to stand at  this time or pt does not have strength to weight bear through his legs at this time. pt follows some commands but not all.  Can do some adls and will work to get back to that baseline.     Vision Baseline Vision/History: No visual deficits Patient Visual Report: No change from baseline Vision Assessment?: No apparent visual deficits Additional Comments: Unsure of acuity. Pt cannot follow commands for full exam. Appears to see everything on tray and in room.     Perception Perception Perception Tested?: No   Praxis Praxis Praxis tested?: Not tested    Pertinent Vitals/Pain Pain Assessment: Faces (Simultaneous filing. User may not have seen previous data.) Pain Location: patient unable to express Pain Descriptors / Indicators: Grimacing Pain Intervention(s): Monitored during session;Repositioned     Hand Dominance Right   Extremity/Trunk Assessment Upper Extremity Assessment Upper Extremity Assessment: Defer to OT evaluation   Lower Extremity Assessment Lower Extremity Assessment: RLE deficits/detail;LLE deficits/detail RLE Deficits / Details: general weakness RLE Coordination: decreased gross motor LLE Deficits / Details: generalized weakness- L LE weaker than right LLE Coordination: decreased gross motor   Cervical / Trunk Assessment Cervical / Trunk Assessment: Other exceptions Cervical / Trunk Exceptions: ACD surgery  and L3-4 disc aspiration on 08/16/2020.   Communication Communication Communication: Expressive difficulties;Receptive difficulties   Cognition Arousal/Alertness: Awake/alert (Simultaneous filing. User may not have seen previous data.) Behavior During Therapy: Anxious (Simultaneous filing. User may not have seen previous data.) Overall Cognitive Status: History of cognitive impairments - at baseline (Simultaneous filing. User may not have seen previous data.)                                 General Comments: Pt at baseline  with cognition per aunt.   General Comments  Pt very limited by LEs weakness and trunk weakness compared to two months ago when pt was walking with walker. Difficult to assess due to cognitive deficicts that pt has at baseline.    Exercises     Shoulder Instructions      Home Living Family/patient expects to be discharged to:: Skilled nursing facility                                 Additional Comments: Aunt assisted with history.      Prior Functioning/Environment Level of Independence: Needs assistance  Gait / Transfers Assistance Needed: Per Aunt, patient has been non ambulatory since February. ADL's / Homemaking Assistance Needed: Pt can dress self and toilet when instructed to do so before this decline 2 months ago.  Family assists with bathing and grooming. Communication / Swallowing Assistance Needed: Pt is essentially nonverbal.  [name] states he has had some issues with swallowing and needs food cut up small.          OT Problem List: Decreased strength;Decreased range of motion;Decreased activity tolerance;Impaired balance (sitting and/or standing);Decreased cognition;Decreased safety awareness;Decreased knowledge of use of DME or AE;Decreased knowledge of precautions;Pain      OT Treatment/Interventions: Self-care/ADL training;Therapeutic exercise;Therapeutic activities;Balance training    OT Goals(Current goals can be  found in the care plan section) Acute Rehab OT Goals Patient Stated Goal: to return to SNF OT Goal Formulation: With family Time For Goal Achievement: 09/16/20 Potential to Achieve Goals: Fair ADL Goals Pt Will Perform Eating: with set-up;sitting Pt Will Perform Upper Body Dressing: with set-up;sitting Pt Will Transfer to Toilet: with mod assist;squat pivot transfer;bedside commode Additional ADL Goal #1: Pt will sit EOB for 10 min while grooming with supervision to improve sitting balance for future adls. Additional ADL Goal #2: Pt will  stand with +2 assist with walker for 30 seconds in prep for adl transfers to [facility] and adls in standing.  OT Frequency: Min 2X/week   Barriers to D/C: Decreased caregiver support  pt lives with aunt but requires much more care than she can handle at this point.       Co-evaluation PT/OT/SLP Co-Evaluation/Treatment: Yes Reason for Co-Treatment: Complexity of the patient's impairments (multi-system involvement);Necessary to address cognition/behavior during functional activity PT goals addressed during session: Mobility/safety with mobility OT goals addressed during session: ADL's and self-care      AM-PAC OT "6 Clicks" Daily Activity     Outcome Measure Help from another person eating meals?: A Little Help from another person taking care of personal grooming?: A Little Help from another person toileting, which includes using toliet, bedpan, or urinal?: Total Help from another person bathing (including washing, rinsing, drying)?: A Lot Help from another person to put on and taking off regular upper body clothing?: A Lot Help from another person to put on and taking off regular lower body clothing?: Total 6 Click Score: 12   End of Session Nurse Communication: Mobility status  Activity Tolerance: Patient limited by pain Patient left: in chair;with call bell/phone within reach;with chair alarm set;with family/visitor present  OT Visit Diagnosis: Other abnormalities of gait and mobility (R26.89);Muscle weakness (generalized) (M62.81);Other symptoms and signs involving the nervous system (R29.898);Other symptoms and signs involving cognitive function;Pain Pain - part of body:  (unable to state where pain is)                Time: 4917-9150 OT Time Calculation (min): 26 min Charges:  OT General Charges $OT Visit: 1 Visit OT Evaluation $OT Eval Moderate Complexity: 1 Mod  Hope Budds 09/02/2020, 12:23 PM

## 2020-09-02 NOTE — Progress Notes (Signed)
PROGRESS NOTE    [name]  HUT:[id number] DOB: 1951/01/14 DOA: 09-15-2020 PCP: Pearson Grippe, MD   Brief Narrative: [name] is a 70 y.o. male with a history of intellectual disability, diabetes mellitus.  Patient presented from a nursing home secondary bilateral lower extremity weakness, urinary retention and abnormal MRI.  Patient was found to have an L3-L4 disc space edema concerning for possible discitis/osteomyelitis versus degenerative edema.  Neurosurgery was consulted on admission who recommended IR consult for aspiration.  Blood cultures obtained on admission.  Antibiotics held secondary to stable patient.   Assessment & Plan:   Principal Problem:   Lower extremity weakness Active Problems:   Urine retention   Intellectual disability   HTN (hypertension)   Diabetes mellitus type 2, noninsulin dependent (HCC)   Nonverbal   Cervical spondylosis with myelopathy and radiculopathy   Severe spinal stenosis C3/4 and C4/5 with cord compression;  - Status post: Anterior Cervical decompression C3-5; Arthrodesis C3/4,4/5 with 7, and 40mm structural allografts; Anterior instrumentation(Globus resonate) C3-5" - Neurosurgery following, Dr. Franky Macho consulted on admission -Tolerated procedure quite well, awake alert this morning tolerating breakfast without difficulty  Urinary retention, chronic - Known history of BPH and chronic Foley use.   - Patient presented to the hospital with a Foley catheter already in place.  Lactic acidosis -Likely in setting of hypovolemia, continue IV fluids  Diabetes mellitus, type 2 Hemoglobin A1C of 7.1%. On metformin as an outpatient. Started on SSI inpatient. Follow hypoglycemia protocol  Hyperlipidemia -Continue Lipitor  BPH Patient is on Flomax and finasteride as an outpatient -Continue finasteride and Flomax  Intellectual disability Non-verbal at baseline Does not appear to follow simple commands  DVT prophylaxis: SCDs Code  Status:   Code Status: Full Code Family Communication: At bedside Disposition Plan: Pending surgical intervention/postop improvement, likely discharge back to facility once medically stable in the next 3 to 4 days  Consultants:   Neurosurgery  Interventional radiology  Procedures:   None  Antimicrobials:  None   Subjective: Non-verbal.  No acute issues or events overnight per staff; tolerating breakfast this morning without difficulty, feeding himself appear to be without any pain  Objective: Vitals:   08/26/2020 2251 09/02/20 0158 09/02/20 0336 09/02/20 0742  BP: 111/61 (!) 106/53 111/62 (!) 110/52  Pulse: 99 85 93 91  Resp: 18 17 18 18   Temp:  (!) 97.5 F (36.4 C) (!) 97.4 F (36.3 C) 98 F (36.7 C)  TempSrc:  Oral Oral   SpO2: 100% 100% 100% 100%  Weight:      Height:        Intake/Output Summary (Last 24 hours) at 09/02/2020 1625 Last data filed at 09/02/2020 0400 Gross per 24 hour  Intake 1454.75 ml  Output 975 ml  Net 479.75 ml   Filed Weights   09-15-20 1451 08/30/20 0201  Weight: 72.6 kg 53.9 kg    Examination:  General exam: Appears calm and comfortable Respiratory system: Clear to auscultation. Respiratory effort normal. Cardiovascular system: S1 & S2 heard, RRR. No murmurs, rubs, gallops or clicks. Gastrointestinal system: Abdomen is nondistended, soft and nontender. No organomegaly or masses felt. Normal bowel sounds heard. Central nervous system: Awake alert, unable to assess orientation, does not follow simple commands.  4/5 strength globally based on limited exam Musculoskeletal: No edema. No calf tenderness Skin: No cyanosis. No rashes   Data Reviewed: I have personally reviewed following labs and imaging studies  CBC Lab Results  Component Value Date  WBC 7.2 09/02/2020   RBC 3.48 (L) 09/02/2020   HGB 9.5 (L) 09/02/2020   HCT 30.4 (L) 09/02/2020   MCV 87.4 09/02/2020   MCH 27.3 09/02/2020   PLT 247 09/02/2020   MCHC 31.3 09/02/2020    RDW 17.4 (H) 09/02/2020   LYMPHSABS 0.7 Sep 21, 2020   MONOABS 0.5 09/21/2020   EOSABS 0.0 21-Sep-2020   BASOSABS 0.0 2020/09/21     Last metabolic panel Lab Results  Component Value Date   NA 133 (L) 09/02/2020   K 3.9 09/02/2020   CL 99 09/02/2020   CO2 21 (L) 09/02/2020   BUN 7 (L) 09/02/2020   CREATININE 0.79 09/02/2020   GLUCOSE 172 (H) 09/02/2020   GFRNONAA >60 09/02/2020   GFRAA >60 07/07/2019   CALCIUM 8.6 (L) 09/02/2020   PHOS 2.6 03/03/2020   PROT 7.5 21-Sep-2020   ALBUMIN 2.9 (L) Sep 21, 2020   BILITOT 0.5 09/21/2020   ALKPHOS 105 21-Sep-2020   AST 16 09/21/2020   ALT 13 09/21/20   ANIONGAP 13 09/02/2020    CBG (last 3)  Recent Labs    09/02/20 0621 09/02/20 1135 09/02/20 1556  GLUCAP 131* 104* 136*     GFR: Estimated Creatinine Clearance: 65.5 mL/min (by C-G formula based on SCr of 0.79 mg/dL).  Coagulation Profile: Recent Labs  Lab 08/30/20 1031  INR 1.1    Recent Results (from the past 240 hour(s))  Culture, blood (routine x 2)     Status: None (Preliminary result)   Collection Time: Sep 21, 2020  4:03 PM   Specimen: BLOOD RIGHT ARM  Result Value Ref Range Status   Specimen Description   Final    BLOOD RIGHT ARM Performed at [facility], [address], [place], [place] [address]    Special Requests   Final    BOTTLES DRAWN AEROBIC AND ANAEROBIC Blood Culture adequate volume Performed at [facility], [address] [place], [place] [address]    Culture   Final    NO GROWTH 4 DAYS Performed at [facility], [address] [place], [place] [address]    Report Status PENDING  Incomplete  Culture, blood (routine x 2)     Status: None (Preliminary result)   Collection Time: 2020/09/21  4:03 PM   Specimen: BLOOD RIGHT HAND  Result Value Ref Range Status   Specimen Description   Final    BLOOD RIGHT HAND Performed at [facility], [address] [name], [place] [address]    Special Requests   Final    BOTTLES DRAWN  AEROBIC AND ANAEROBIC Blood Culture adequate volume Performed at [facility], [address] [place], [place] [address]    Culture   Final    NO GROWTH 4 DAYS Performed at [facility], [address] [place], [place] [address]    Report Status PENDING  Incomplete  Resp Panel by RT-PCR (Flu A&B, Covid) Nasopharyngeal Swab     Status: None   Collection Time: 09-21-2020  6:05 PM   Specimen: Nasopharyngeal Swab; Nasopharyngeal(NP) swabs in vial transport medium  Result Value Ref Range Status   SARS Coronavirus 2 by RT PCR NEGATIVE NEGATIVE Final    Comment: (NOTE) SARS-CoV-2 target nucleic acids are NOT DETECTED.  The SARS-CoV-2 RNA is generally detectable in upper respiratory specimens during the acute phase of infection. The lowest concentration of SARS-CoV-2 viral copies this assay can detect is 138 copies/mL. A negative result does not preclude SARS-Cov-2 infection and should not be used as the  sole basis for treatment or other patient management decisions. A negative result may occur with  improper specimen collection/handling, submission of specimen other than nasopharyngeal swab, presence of viral mutation(s) within the areas targeted by this assay, and inadequate number of viral copies(<138 copies/mL). A negative result must be combined with clinical observations, patient history, and epidemiological information. The expected result is Negative.  Fact Sheet for Patients:  BloggerCourse.comhttps://www.fda.gov/media/152166/download  Fact Sheet for Healthcare Providers:  SeriousBroker.ithttps://www.fda.gov/media/152162/download  This test is no t yet approved or cleared by the [place] FDA and  has been authorized for detection and/or diagnosis of SARS-CoV-2 by FDA under an Emergency Use Authorization (EUA). This EUA will remain  in effect (meaning this test can be used) for the duration of the COVID-19 declaration under Section 564(b)(1) of the Act, 21 U.S.C.section 360bbb-3(b)(1), unless the  authorization is terminated  or revoked sooner.       Influenza A by PCR NEGATIVE NEGATIVE Final   Influenza B by PCR NEGATIVE NEGATIVE Final    Comment: (NOTE) The Xpert Xpress SARS-CoV-2/FLU/RSV plus assay is intended as an aid in the diagnosis of influenza from Nasopharyngeal swab specimens and should not be used as a sole basis for treatment. Nasal washings and aspirates are unacceptable for Xpert Xpress SARS-CoV-2/FLU/RSV testing.  Fact Sheet for Patients: BloggerCourse.comhttps://www.fda.gov/media/152166/download  Fact Sheet for Healthcare Providers: SeriousBroker.ithttps://www.fda.gov/media/152162/download  This test is not yet approved or cleared by the [place] FDA and has been authorized for detection and/or diagnosis of SARS-CoV-2 by FDA under an Emergency Use Authorization (EUA). This EUA will remain in effect (meaning this test can be used) for the duration of the COVID-19 declaration under Section 564(b)(1) of the Act, 21 U.S.C. section 360bbb-3(b)(1), unless the authorization is terminated or revoked.  Performed at [facility], [address] [place], [place] [address]   Surgical PCR screen     Status: None   Collection Time: 08/31/20 11:36 AM   Specimen: Nasal Mucosa; Nasal Swab  Result Value Ref Range Status   MRSA, PCR NEGATIVE NEGATIVE Final   Staphylococcus aureus NEGATIVE NEGATIVE Final    Comment: (NOTE) The Xpert SA Assay (FDA approved for NASAL specimens in patients 71 years of age and older), is one component of a comprehensive surveillance program. It is not intended to diagnose infection nor to guide or monitor treatment. Performed at [facility], [address] [place], [place] [address]   Aerobic/Anaerobic Culture w Gram Stain (surgical/deep wound)     Status: None (Preliminary result)   Collection Time: 08/21/2020  3:12 PM   Specimen: PATH Disc; Tissue  Result Value Ref Range Status   Specimen Description WOUND  Final   Special Requests L3,L4 DISC ASPIRATION   Final   Gram Stain   Final    FEW WBC PRESENT,BOTH PMN AND MONONUCLEAR NO ORGANISMS SEEN    Culture   Final    NO GROWTH < 24 HOURS Performed at [facility], [address] [place], [place] [address]    Report Status PENDING  Incomplete     Radiology Studies: DG Cervical Spine 2 or 3 views  Result Date: 08/31/2020 CLINICAL DATA:  Cervical fusion EXAM: CERVICAL SPINE - 1 VIEW COMPARISON:  02-07-21 FINDINGS: Interbody fusion is noted at C3-4 and C4-5 with anterior fixation. No soft tissue abnormality is noted. Osteophytic changes are seen at C5-6. No acute abnormality is noted. IMPRESSION: Status post C3-C5 fusion. Electronically Signed   By: Alcide CleverMark  Lukens M.D.   On:  09/12/2020 19:21    Scheduled Meds: . atorvastatin  20 mg Oral QHS  . Chlorhexidine Gluconate Cloth  6 each Topical Daily  . docusate sodium  100 mg Oral Daily  . finasteride  5 mg Oral Daily  . insulin aspart  0-9 Units Subcutaneous Q6H  . magnesium oxide  400 mg Oral Daily  . senna  2 tablet Oral BID  . sodium chloride flush  3 mL Intravenous Q12H  . tamsulosin  0.4 mg Oral Daily    LOS: 4 days   Carma Leaven, DO [facility] 09/02/2020, 4:25 PM  If 7PM-7AM, please contact night-coverage [email or web address]

## 2020-09-02 NOTE — Plan of Care (Signed)

## 2020-09-02 NOTE — Evaluation (Signed)
Physical Therapy Evaluation Patient Details Name: [name] MRN: [id number] DOB: Sep 15, 1950 Today's Date: 09/02/2020   History of Present Illness  [name] is a 70 y.o. male with medical history significant for intellectual disability, diabetes mellitus, hypertension.  Patient was sent to the ED from nursing home with reports of abnormal MRI.  History is mostly obtained from chart review as patient's is unable to communicate due to baseline intellectual disability.  Over the past month, patient has been having bilateral lower extremity weakness, with urinary retention.  At the of my evaluation he shakes his head - No to any other complaints.     Patient had thoracic and lumbar MRI 08/25/20.  With lumbar MRI showing abnormal appearance of L3-L4 disc space with prominent edema in the L3-L4 vertebral bodies and possible small endplate erosions but no significant paravertebral soft tissue inflammation.  Findings indeterminate for infectious discitis-osteomyelitis versus prominent degenerative edema.  CT cervical spine shows stenosis. S/P ACDF at C3/4, 4/5 09/11/2020.  Clinical Impression  Patient received in bed, he is non-verbal but able to shake head, and follows commands. Patient requires max +2 assist for bed mobility and total assist for pivot transfer to recliner. Attempted to stand patient with RW and +2 max assist but patient was unable to clear bottom from bed. He will continue to benefit from skilled PT while here to improve functional mobility and independence.      Follow Up Recommendations SNF    Equipment Recommendations  None recommended by PT;Other (comment) (TBD)    Recommendations for Other Services       Precautions / Restrictions Precautions Precautions: Fall Restrictions Weight Bearing Restrictions: No      Mobility  Bed Mobility Overal bed mobility: Needs Assistance Bed Mobility: Supine to Sit     Supine to sit: Max assist;+2 for physical assistance           Transfers Overall transfer level: Needs assistance Equipment used: None Transfers: Squat Pivot Transfers           General transfer comment: attempted to assist patient to stand using RW and max +2 assist. Patient unable to clear bottom from bed.  Ambulation/Gait Ambulation/Gait assistance: Total assist;+2 physical assistance           General Gait Details: unable  Stairs            Wheelchair Mobility    Modified Rankin (Stroke Patients Only)       Balance Overall balance assessment: Needs assistance Sitting-balance support: Feet supported Sitting balance-[name]: Fair Sitting balance - Comments: supervision for safety     Standing balance-[name]: Zero                               Pertinent Vitals/Pain Pain Assessment: Faces Faces Pain Scale: Hurts little more Pain Location: patient unable to express Pain Descriptors / Indicators: Grimacing Pain Intervention(s): Monitored during session;Repositioned    Home Living Family/patient expects to be discharged to:: Skilled nursing facility                      Prior Function Level of Independence: Needs assistance   Gait / Transfers Assistance Needed: Per Aunt, patient has been non ambulatory since February.  ADL's / Homemaking Assistance Needed: assisted by family        Hand Dominance        Extremity/Trunk Assessment   Upper Extremity Assessment Upper  Extremity Assessment: Defer to OT evaluation    Lower Extremity Assessment Lower Extremity Assessment: RLE deficits/detail;LLE deficits/detail RLE Deficits / Details: general weakness RLE Coordination: decreased gross motor LLE Deficits / Details: generalized weakness- L LE weaker than right LLE Coordination: decreased gross motor       Communication   Communication: Expressive difficulties  Cognition Arousal/Alertness: Awake/alert Behavior During Therapy: WFL for tasks assessed/performed Overall  Cognitive Status: History of cognitive impairments - at baseline                                        General Comments      Exercises     Assessment/Plan    PT Assessment Patient needs continued PT services  PT Problem List Decreased strength;Decreased mobility;Decreased activity tolerance;Decreased balance;Pain;Decreased safety awareness       PT Treatment Interventions Therapeutic exercise;Gait training;Balance training;Stair training;Functional mobility training;Therapeutic activities;Patient/family education    PT Goals (Current goals can be found in the Care Plan section)  Acute Rehab PT Goals Patient Stated Goal: to return to SNF PT Goal Formulation: With family Time For Goal Achievement: 09/13/20 Potential to Achieve Goals: Good    Frequency Min 3X/week   Barriers to discharge Decreased caregiver support      Co-evaluation               AM-PAC PT "6 Clicks" Mobility  Outcome Measure Help needed turning from your back to your side while in a flat bed without using bedrails?: Total Help needed moving from lying on your back to sitting on the side of a flat bed without using bedrails?: Total Help needed moving to and from a bed to a chair (including a wheelchair)?: Total Help needed standing up from a chair using your arms (e.g., wheelchair or bedside chair)?: Total Help needed to walk in hospital room?: Total Help needed climbing 3-5 steps with a railing? : Total 6 Click Score: 6    End of Session   Activity Tolerance: Patient tolerated treatment well;Patient limited by pain Patient left: in chair;with call bell/phone within reach;with bed alarm set;with family/visitor present Nurse Communication: Mobility status PT Visit Diagnosis: Other abnormalities of gait and mobility (R26.89);Muscle weakness (generalized) (M62.81);Pain    Time: 4401-0272 PT Time Calculation (min) (ACUTE ONLY): 26 min   Charges:   PT Evaluation $PT Eval  Moderate Complexity: 1 Mod          Ashyah Quizon, PT, GCS 09/02/20,12:17 PM

## 2020-09-03 ENCOUNTER — Inpatient Hospital Stay (HOSPITAL_COMMUNITY): Payer: Medicare Other

## 2020-09-03 DIAGNOSIS — F79 Unspecified intellectual disabilities: Secondary | ICD-10-CM | POA: Diagnosis not present

## 2020-09-03 DIAGNOSIS — R29898 Other symptoms and signs involving the musculoskeletal system: Secondary | ICD-10-CM | POA: Diagnosis not present

## 2020-09-03 DIAGNOSIS — I10 Essential (primary) hypertension: Secondary | ICD-10-CM | POA: Diagnosis not present

## 2020-09-03 DIAGNOSIS — E119 Type 2 diabetes mellitus without complications: Secondary | ICD-10-CM | POA: Diagnosis not present

## 2020-09-03 LAB — CBC
HCT: 30.1 % — ABNORMAL LOW (ref 39.0–52.0)
Hemoglobin: 9.4 g/dL — ABNORMAL LOW (ref 13.0–17.0)
MCH: 27.1 pg (ref 26.0–34.0)
MCHC: 31.2 g/dL (ref 30.0–36.0)
MCV: 86.7 fL (ref 80.0–100.0)
Platelets: 263 10*3/uL (ref 150–400)
RBC: 3.47 MIL/uL — ABNORMAL LOW (ref 4.22–5.81)
RDW: 17.6 % — ABNORMAL HIGH (ref 11.5–15.5)
WBC: 5 10*3/uL (ref 4.0–10.5)
nRBC: 0 % (ref 0.0–0.2)

## 2020-09-03 LAB — CULTURE, BLOOD (ROUTINE X 2)
Culture: NO GROWTH
Culture: NO GROWTH
Special Requests: ADEQUATE
Special Requests: ADEQUATE

## 2020-09-03 LAB — BASIC METABOLIC PANEL
Anion gap: 7 (ref 5–15)
BUN: <5 mg/dL — ABNORMAL LOW (ref 8–23)
CO2: 26 mmol/L (ref 22–32)
Calcium: 8.4 mg/dL — ABNORMAL LOW (ref 8.9–10.3)
Chloride: 102 mmol/L (ref 98–111)
Creatinine, Ser: 0.56 mg/dL — ABNORMAL LOW (ref 0.61–1.24)
GFR, Estimated: >60 mL/min (ref >60)
Glucose, Bld: 128 mg/dL — ABNORMAL HIGH (ref 70–99)
Potassium: 3.8 mmol/L (ref 3.5–5.1)
Sodium: 135 mmol/L (ref 135–145)

## 2020-09-03 LAB — GLUCOSE, CAPILLARY
Glucose-Capillary: 134 mg/dL — ABNORMAL HIGH (ref 70–99)
Glucose-Capillary: 110 mg/dL — ABNORMAL HIGH (ref 70–99)
Glucose-Capillary: 119 mg/dL — ABNORMAL HIGH (ref 70–99)
Glucose-Capillary: 105 mg/dL — ABNORMAL HIGH (ref 70–99)

## 2020-09-03 MED ORDER — LORAZEPAM 2 MG/ML IJ SOLN: 1.0000 mg | Freq: Once | INTRAMUSCULAR | Status: DC

## 2020-09-03 MED ORDER — LORAZEPAM 2 MG/ML IJ SOLN
1.0000 mg | Freq: Once | INTRAMUSCULAR | Status: AC
  Administered 2020-09-03: 1 mg via INTRAVENOUS
  Filled 2020-09-03: qty 1

## 2020-09-03 NOTE — Plan of Care (Signed)

## 2020-09-03 NOTE — Progress Notes (Signed)
PROGRESS NOTE    Levi Shinelbert S Chaudhuri  ZOX:[id number] DOB: 24-Oct-1950 DOA: 09/04/2020 PCP: [name], [name], MD   Brief Narrative: [name] is a 70 y.o. male with a history of intellectual disability, diabetes mellitus.  Patient presented from a nursing home secondary bilateral lower extremity weakness, urinary retention and abnormal MRI.  Patient was found to have an L3-L4 disc space edema concerning for possible discitis/osteomyelitis versus degenerative edema.  Neurosurgery was consulted on admission who recommended IR consult for aspiration.  Blood cultures obtained on admission.  Antibiotics held secondary to stable patient.   Assessment & Plan:   Principal Problem:   Lower extremity weakness Active Problems:   Urine retention   Intellectual disability   HTN (hypertension)   Diabetes mellitus type 2, noninsulin dependent (HCC)   Nonverbal   Cervical spondylosis with myelopathy and radiculopathy   Severe spinal stenosis C3/4 and C4/5 with cord compression;  - Status post: Anterior Cervical decompression C3-5; Arthrodesis C3/4,4/5 with 7, and 8mm structural allografts; Anterior instrumentation(Globus resonate) C3-5" - Neurosurgery following, Dr. Franky Machoabbell consulted on admission -Tolerated procedure quite well, awake alert this morning tolerating breakfast without difficulty -Left arm weakness ongoing - unclear timing/onset given patient's mental status - MRI today shows severe C 3/4 stenosis - neurology aware  Urinary retention, chronic - Known history of BPH and chronic Foley use.   - Patient presented to the hospital with a Foley catheter already in place.  Lactic acidosis -Likely in setting of hypovolemia, continue IV fluids  Diabetes mellitus, type 2 Hemoglobin A1C of 7.1%. On metformin as an outpatient. Started on SSI inpatient. Follow hypoglycemia protocol  Hyperlipidemia -Continue Lipitor  BPH Patient is on Flomax and finasteride as an outpatient -Continue  finasteride and Flomax  Intellectual disability Non-verbal at baseline Does not appear to follow simple commands  DVT prophylaxis: SCDs Code Status:   Code Status: Full Code Family Communication: At bedside Disposition Plan: Pending surgical intervention/postop improvement, likely discharge back to facility once medically stable in the next 3 to 4 days  Consultants:   Neurosurgery  Interventional radiology  Procedures:   None  Antimicrobials:  None   Subjective: Non-verbal. L arm weakness noted yesterday - late day/evening MRI ordered - results show stenosis. ROS per patient very limited  Objective: Vitals:   09/02/20 0742 09/02/20 1218 09/02/20 2234 09/03/20 0547  BP: (!) 110/52 113/69 100/60 112/66  Pulse: 91 90 82 96  Resp: 18 16 19 17   Temp: 98 F (36.7 C) 97.6 F (36.4 C) (!) 97.4 F (36.3 C) 98.6 F (37 C)  TempSrc:      SpO2: 100% 99% 92% 95%  Weight:      Height:       No intake or output data in the 24 hours ending 09/03/20 0803 Filed Weights   08/27/2020 1451 08/30/20 0201  Weight: 72.6 kg 53.9 kg    Examination:  General exam: Appears calm and comfortable Respiratory system: Clear to auscultation. Respiratory effort normal. Cardiovascular system: S1 & S2 heard, RRR. No murmurs, rubs, gallops or clicks. Gastrointestinal system: Abdomen is nondistended, soft and nontender. No organomegaly or masses felt. Normal bowel sounds heard. Central nervous system: Awake alert, unable to assess orientation, does not follow simple commands.  4/5 strength globally based on limited exam Musculoskeletal: No edema. No calf tenderness Skin: No cyanosis. No rashes   Data Reviewed: I have personally reviewed following labs and imaging studies  CBC Lab Results  Component Value Date   WBC 5.0  09/03/2020   RBC 3.47 (L) 09/03/2020   HGB 9.4 (L) 09/03/2020   HCT 30.1 (L) 09/03/2020   MCV 86.7 09/03/2020   MCH 27.1 09/03/2020   PLT 263 09/03/2020   MCHC 31.2  09/03/2020   RDW 17.6 (H) 09/03/2020   LYMPHSABS 0.7 08/17/2020   MONOABS 0.5 09/12/2020   EOSABS 0.0 09/09/2020   BASOSABS 0.0 08/30/2020     Last metabolic panel Lab Results  Component Value Date   NA 135 09/03/2020   K 3.8 09/03/2020   CL 102 09/03/2020   CO2 26 09/03/2020   BUN <5 (L) 09/03/2020   CREATININE 0.56 (L) 09/03/2020   GLUCOSE 128 (H) 09/03/2020   GFRNONAA >60 09/03/2020   GFRAA >60 07/07/2019   CALCIUM 8.4 (L) 09/03/2020   PHOS 2.6 03/03/2020   PROT 7.5 09/05/2020   ALBUMIN 2.9 (L) 08/18/2020   BILITOT 0.5 09/06/2020   ALKPHOS 105 09/07/2020   AST 16 09/11/2020   ALT 13 09/06/2020   ANIONGAP 7 09/03/2020    CBG (last 3)  Recent Labs    09/02/20 1556 09/03/20 0106 09/03/20 0523  GLUCAP 136* 134* 110*     GFR: Estimated Creatinine Clearance: 65.5 mL/min (A) (by C-G formula based on SCr of 0.56 mg/dL (L)).  Coagulation Profile: Recent Labs  Lab 08/30/20 1031  INR 1.1    Recent Results (from the past 240 hour(s))  Culture, blood (routine x 2)     Status: None   Collection Time: 09/04/2020  4:03 PM   Specimen: BLOOD RIGHT ARM  Result Value Ref Range Status   Specimen Description   Final    BLOOD RIGHT ARM Performed at [facility], [address], [place], [place] [address]    Special Requests   Final    BOTTLES DRAWN AEROBIC AND ANAEROBIC Blood Culture adequate volume Performed at [facility], [address] [place], [place] [address]    Culture   Final    NO GROWTH 5 DAYS Performed at [facility], [address], [place], [place] [address]    Report Status 09/03/2020 FINAL  Final  Culture, blood (routine x 2)     Status: None   Collection Time: 09/13/2020  4:03 PM   Specimen: BLOOD RIGHT HAND  Result Value Ref Range Status   Specimen Description   Final    BLOOD RIGHT HAND Performed at [facility], [address], [place], [place] [address]    Special Requests   Final    BOTTLES DRAWN AEROBIC AND ANAEROBIC  Blood Culture adequate volume Performed at [facility], [address] [place], [place] [address]    Culture   Final    NO GROWTH 5 DAYS Performed at [facility], [address], [place], [place] [address]    Report Status 09/03/2020 FINAL  Final  Resp Panel by RT-PCR (Flu A&B, Covid) Nasopharyngeal Swab     Status: None   Collection Time: 08/23/2020  6:05 PM   Specimen: Nasopharyngeal Swab; Nasopharyngeal(NP) swabs in vial transport medium  Result Value Ref Range Status   SARS Coronavirus 2 by RT PCR NEGATIVE NEGATIVE Final    Comment: (NOTE) SARS-CoV-2 target nucleic acids are NOT DETECTED.  The SARS-CoV-2 RNA is generally detectable in upper respiratory specimens during the acute phase of infection. The lowest concentration of SARS-CoV-2 viral copies this assay can detect is 138 copies/mL. A negative result does not preclude SARS-Cov-2 infection and should not be used as the sole basis for  treatment or other patient management decisions. A negative result may occur with  improper specimen collection/handling, submission of specimen other than nasopharyngeal swab, presence of viral mutation(s) within the areas targeted by this assay, and inadequate number of viral copies(<138 copies/mL). A negative result must be combined with clinical observations, patient history, and epidemiological information. The expected result is Negative.  Fact Sheet for Patients:  BloggerCourse.com  Fact Sheet for Healthcare Providers:  SeriousBroker.it  This test is no t yet approved or cleared by the [place] FDA and  has been authorized for detection and/or diagnosis of SARS-CoV-2 by FDA under an Emergency Use Authorization (EUA). This EUA will remain  in effect (meaning this test can be used) for the duration of the COVID-19 declaration under Section 564(b)(1) of the Act, 21 U.S.C.section 360bbb-3(b)(1), unless the authorization is  terminated  or revoked sooner.       Influenza A by PCR NEGATIVE NEGATIVE Final   Influenza B by PCR NEGATIVE NEGATIVE Final    Comment: (NOTE) The Xpert Xpress SARS-CoV-2/FLU/RSV plus assay is intended as an aid in the diagnosis of influenza from Nasopharyngeal swab specimens and should not be used as a sole basis for treatment. Nasal washings and aspirates are unacceptable for Xpert Xpress SARS-CoV-2/FLU/RSV testing.  Fact Sheet for Patients: BloggerCourse.com  Fact Sheet for Healthcare Providers: SeriousBroker.it  This test is not yet approved or cleared by the [place] FDA and has been authorized for detection and/or diagnosis of SARS-CoV-2 by FDA under an Emergency Use Authorization (EUA). This EUA will remain in effect (meaning this test can be used) for the duration of the COVID-19 declaration under Section 564(b)(1) of the Act, 21 U.S.C. section 360bbb-3(b)(1), unless the authorization is terminated or revoked.  Performed at [facility], [address] [place], [place] [address]   Surgical PCR screen     Status: None   Collection Time: 08/31/20 11:36 AM   Specimen: Nasal Mucosa; Nasal Swab  Result Value Ref Range Status   MRSA, PCR NEGATIVE NEGATIVE Final   Staphylococcus aureus NEGATIVE NEGATIVE Final    Comment: (NOTE) The Xpert SA Assay (FDA approved for NASAL specimens in patients 58 years of age and older), is one component of a comprehensive surveillance program. It is not intended to diagnose infection nor to guide or monitor treatment. Performed at [facility], [address], [place], [place] [address]   Aerobic/Anaerobic Culture w Gram Stain (surgical/deep wound)     Status: None (Preliminary result)   Collection Time: 10-Sep-2020  3:12 PM   Specimen: PATH Disc; Tissue  Result Value Ref Range Status   Specimen Description WOUND  Final   Special Requests L3,L4 DISC ASPIRATION  Final   Gram  Stain   Final    FEW WBC PRESENT,BOTH PMN AND MONONUCLEAR NO ORGANISMS SEEN    Culture   Final    NO GROWTH < 24 HOURS Performed at [facility], [address] [place], [place] [address]    Report Status PENDING  Incomplete     Radiology Studies: DG Cervical Spine 2 or 3 views  Result Date: 09-10-20 CLINICAL DATA:  Cervical fusion EXAM: CERVICAL SPINE - 1 VIEW COMPARISON:  08/19/2020 FINDINGS: Interbody fusion is noted at C3-4 and C4-5 with anterior fixation. No soft tissue abnormality is noted. Osteophytic changes are seen at C5-6. No acute abnormality is noted. IMPRESSION: Status post C3-C5 fusion. Electronically Signed   By: Alcide Clever M.D.   On: 2020/09/10 19:21  Scheduled Meds: . atorvastatin  20 mg Oral QHS  . Chlorhexidine Gluconate Cloth  6 each Topical Daily  . docusate sodium  100 mg Oral Daily  . finasteride  5 mg Oral Daily  . insulin aspart  0-9 Units Subcutaneous Q6H  . magnesium oxide  400 mg Oral Daily  . senna  2 tablet Oral BID  . sodium chloride flush  3 mL Intravenous Q12H  . tamsulosin  0.4 mg Oral Daily    LOS: 5 days   Carma Leaven, DO [facility] 09/03/2020, 8:03 AM  If 7PM-7AM, please contact night-coverage [email or web address]

## 2020-09-03 NOTE — Progress Notes (Signed)
Subjective: NAEs o/n  Objective: Vital signs in last 24 hours: Temp:  [97.4 F (36.3 C)-98.6 F (37 C)] 98.6 F (37 C) (05/21 0547) Pulse Rate:  [82-96] 96 (05/21 0547) Resp:  [16-19] 17 (05/21 0547) BP: (100-113)/(60-69) 112/66 (05/21 0547) SpO2:  [92 %-99 %] 95 % (05/21 0547)  Intake/Output from previous day: No intake/output data recorded. Intake/Output this shift: No intake/output data recorded.  Eyes open, noncommunicative.  Gaunt, LEs contracted.  Not following commands but put blanket back on himself with right hand after I removed it.  Incision c/d  Lab Results: Recent Labs    09/02/20 0240 09/03/20 0416  WBC 7.2 5.0  HGB 9.5* 9.4*  HCT 30.4* 30.1*  PLT 247 263   BMET Recent Labs    09/02/20 0240 09/03/20 0416  NA 133* 135  K 3.9 3.8  CL 99 102  CO2 21* 26  GLUCOSE 172* 128*  BUN 7* <5*  CREATININE 0.79 0.56*  CALCIUM 8.6* 8.4*    Studies/Results: DG Cervical Spine 2 or 3 views  Result Date: 08/22/2020 CLINICAL DATA:  Cervical fusion EXAM: CERVICAL SPINE - 1 VIEW COMPARISON:  09/03/2020 FINDINGS: Interbody fusion is noted at C3-4 and C4-5 with anterior fixation. No soft tissue abnormality is noted. Osteophytic changes are seen at C5-6. No acute abnormality is noted. IMPRESSION: Status post C3-C5 fusion. Electronically Signed   By: Alcide Clever M.D.   On: 08/26/2020 19:21    Assessment/Plan: 70 yo M s/p ACDF - f/u lumbar aspiration results - continue supportive care with PT/OT - will likely return to SNF  [name] 09/03/2020, 12:10 PM

## 2020-09-03 NOTE — Progress Notes (Signed)
   09/03/20 1500  Assess: MEWS Score  Temp (!) 97.4 F (36.3 C)  BP 128/67  Pulse Rate (!) 116 (RN notified)  Resp 18  SpO2 91 %  O2 Device Nasal Cannula  O2 Flow Rate (L/min) 2 L/min  Assess: MEWS Score  MEWS Temp 0  MEWS Systolic 0  MEWS Pulse 2  MEWS RR 0  MEWS LOC 0  MEWS Score 2  MEWS Score Color Yellow  Assess: if the MEWS score is Yellow or Red  Were vital signs taken at a resting state? Yes  Focused Assessment No change from prior assessment  Early Detection of Sepsis Score *See Row Information* Low  MEWS guidelines implemented *See Row Information* Yes  Treat  MEWS Interventions Other (Comment) (placed on oxygen)  Pain Scale Faces  Pain Score Asleep  Take Vital Signs  Increase Vital Sign Frequency  Yellow: Q 2hr X 2 then Q 4hr X 2, if remains yellow, continue Q 4hrs  Escalate  MEWS: Escalate Yellow: discuss with charge nurse/RN and consider discussing with provider and RRT  Notify: Charge Nurse/RN  Name of Charge Nurse/RN Notified George, RN  Date Charge Nurse/RN Notified 09/03/20  Time Charge Nurse/RN Notified 1555  Notify: Provider  Provider Name/Title Natale Milch, MD  Date Provider Notified 09/03/20  Time Provider Notified 1556  Notification Type Page  Notification Reason Other (Comment) (FYI of tachy)  Date of Provider Response 09/03/20  Time of Provider Response 1557  Document  Patient Outcome Stabilized after interventions  Progress note created (see row info) Yes

## 2020-09-04 ENCOUNTER — Inpatient Hospital Stay (HOSPITAL_COMMUNITY): Payer: Medicare Other

## 2020-09-04 DIAGNOSIS — R29898 Other symptoms and signs involving the musculoskeletal system: Secondary | ICD-10-CM | POA: Diagnosis not present

## 2020-09-04 DIAGNOSIS — I10 Essential (primary) hypertension: Secondary | ICD-10-CM | POA: Diagnosis not present

## 2020-09-04 DIAGNOSIS — E119 Type 2 diabetes mellitus without complications: Secondary | ICD-10-CM | POA: Diagnosis not present

## 2020-09-04 DIAGNOSIS — F79 Unspecified intellectual disabilities: Secondary | ICD-10-CM | POA: Diagnosis not present

## 2020-09-04 LAB — BASIC METABOLIC PANEL
Anion gap: 11 (ref 5–15)
BUN: <5 mg/dL — ABNORMAL LOW (ref 8–23)
CO2: 25 mmol/L (ref 22–32)
Calcium: 8.7 mg/dL — ABNORMAL LOW (ref 8.9–10.3)
Chloride: 99 mmol/L (ref 98–111)
Creatinine, Ser: 0.58 mg/dL — ABNORMAL LOW (ref 0.61–1.24)
GFR, Estimated: >60 mL/min (ref >60)
Glucose, Bld: 103 mg/dL — ABNORMAL HIGH (ref 70–99)
Potassium: 3.7 mmol/L (ref 3.5–5.1)
Sodium: 135 mmol/L (ref 135–145)

## 2020-09-04 LAB — BLOOD GAS, ARTERIAL
Acid-base deficit: 1 mmol/L (ref 0.0–2.0)
Bicarbonate: 23.2 mmol/L (ref 20.0–28.0)
Drawn by: 56037
FIO2: 100
O2 Saturation: 94.2 %
Patient temperature: 37
pCO2 arterial: 38.8 mmHg (ref 32.0–48.0)
pH, Arterial: 7.394 (ref 7.350–7.450)
pO2, Arterial: 73.9 mmHg — ABNORMAL LOW (ref 83.0–108.0)

## 2020-09-04 LAB — GLUCOSE, CAPILLARY
Glucose-Capillary: 102 mg/dL — ABNORMAL HIGH (ref 70–99)
Glucose-Capillary: 118 mg/dL — ABNORMAL HIGH (ref 70–99)
Glucose-Capillary: 93 mg/dL (ref 70–99)
Glucose-Capillary: 96 mg/dL (ref 70–99)

## 2020-09-04 LAB — CBC
HCT: 35.3 % — ABNORMAL LOW (ref 39.0–52.0)
Hemoglobin: 11.4 g/dL — ABNORMAL LOW (ref 13.0–17.0)
MCH: 27.5 pg (ref 26.0–34.0)
MCHC: 32.3 g/dL (ref 30.0–36.0)
MCV: 85.1 fL (ref 80.0–100.0)
Platelets: 314 10*3/uL (ref 150–400)
RBC: 4.15 MIL/uL — ABNORMAL LOW (ref 4.22–5.81)
RDW: 17.4 % — ABNORMAL HIGH (ref 11.5–15.5)
WBC: 5.6 10*3/uL (ref 4.0–10.5)
nRBC: 0 % (ref 0.0–0.2)

## 2020-09-04 MED ORDER — FUROSEMIDE 10 MG/ML IJ SOLN
40.0000 mg | Freq: Two times a day (BID) | INTRAMUSCULAR | Status: AC
  Administered 2020-09-04 – 2020-09-05 (×2): 40 mg via INTRAVENOUS
  Filled 2020-09-04 (×2): qty 4

## 2020-09-04 MED ORDER — FUROSEMIDE 10 MG/ML IJ SOLN
INTRAMUSCULAR | Status: AC
  Filled 2020-09-04: qty 4

## 2020-09-04 MED ORDER — FUROSEMIDE 10 MG/ML IJ SOLN
40.0000 mg | Freq: Once | INTRAMUSCULAR | Status: AC
  Administered 2020-09-04: 40 mg via INTRAVENOUS

## 2020-09-04 MED ORDER — DILTIAZEM HCL 25 MG/5ML IV SOLN
5.0000 mg | Freq: Once | INTRAVENOUS | Status: AC
  Administered 2020-09-04: 5 mg via INTRAVENOUS
  Filled 2020-09-04: qty 5

## 2020-09-04 NOTE — Progress Notes (Addendum)
Pt transferred to 2w14 with HR tachy in 130s and soft bp. Received notification from [place], [place] monitoring pt via camera that 02 was off and pt was satting in the 70s. Upon entering room, Pt restless on room air.  Pt constantly pulling 02 off. Pt's right hand restrained per order. hospitalist paged through [facility].   2045: Dr. Rachael Darby returned call and updated on pt's status and asked to eval pt in person. New orders received and Dr. Rachael Darby will see pt as soon as he's able. Will continue to monitor pt.

## 2020-09-04 NOTE — Progress Notes (Signed)
NT suction performed with moderate amount of secreations obtained, RT will continue to monitor.

## 2020-09-04 NOTE — Progress Notes (Signed)
PROGRESS NOTE    LEANDRO BERKOWITZ  GBT:[id number] DOB: [birth year] DOA: Sep 07, 2020 PCP: Pearson Grippe, MD   Brief Narrative: [name] is a 70 y.o. male with a history of intellectual disability, diabetes mellitus.  Patient presented from a nursing home secondary bilateral lower extremity weakness, urinary retention and abnormal MRI.  Patient was found to have an L3-L4 disc space edema concerning for possible discitis/osteomyelitis versus degenerative edema.  Neurosurgery was consulted on admission who recommended IR consult for aspiration.  Blood cultures obtained on admission.  Antibiotics held secondary to stable patient.   Assessment & Plan:   Principal Problem:   Lower extremity weakness Active Problems:   Urine retention   Intellectual disability   HTN (hypertension)   Diabetes mellitus type 2, noninsulin dependent (HCC)   Nonverbal   Cervical spondylosis with myelopathy and radiculopathy   Severe spinal stenosis C3/4 and C4/5 with cord compression;  - Status post: Anterior Cervical decompression C3-5; Arthrodesis C3/4,4/5 with 7, and 51mm structural allografts; Anterior instrumentation(Globus resonate) C3-5" - Neurosurgery following, Dr. Franky Macho consulted on admission -Tolerated procedure quite well, awake alert this morning tolerating breakfast without difficulty -Left arm weakness ongoing - unclear timing/onset given patient's mental status - MRI today shows severe C 3/4 stenosis - neurology aware  Transient acute agitation/mental status alteration, hypoxia - High risk for aspiration/hypervolemia - On 5/21 - required ativan for repeat MRI due to anxiety/behavoir likely had some small aspiration during this timeframe - Mental status improving over the past 48 hours - Continues on supplemental oxygen today -extremely noncompliant with supplemental oxygen, have recommended restraints and mittens to the nurses has necessary to keep oxygen on while he still requires  it  Urinary retention, chronic - Known history of BPH and chronic Foley use.   - Patient presented to the hospital with a Foley catheter already in place.  Lactic acidosis -Likely in setting of hypovolemia, tolerating PO well - IVF disicontinued  Diabetes mellitus, type 2 Hemoglobin A1C of 7.1%. On metformin as an outpatient.  Continue sliding scale, hypoglycemic protocol  Hyperlipidemia -Continue Lipitor  BPH Patient is on Flomax and finasteride as an outpatient -Continue finasteride and Flomax  Intellectual disability Non-verbal at baseline Does appear to follow simple commands when he wants to  DVT prophylaxis: SCDs Code Status:   Code Status: Full Code Family Communication: At bedside Disposition Plan: Back to facility once acute symptoms have resolved as above and stable from a surgical standpoint  Consultants:   Neurosurgery  Interventional radiology  Procedures:   None  Antimicrobials:  None   Subjective: Ongoing hypoxia in the setting of likely aspiration as above, he appears calm and stable no acute distress unfortunately unable to review systems due to patient's mental status  Objective: Vitals:   09/03/20 1700 09/03/20 2017 09/03/20 2348 09/04/20 0528  BP: 116/60 122/65 131/72 127/69  Pulse: (!) 121 (!) 115 (!) 119 (!) 128  Resp: 14 19 16 16   Temp: (!) 97.1 F (36.2 C) (!) 97.5 F (36.4 C) 97.6 F (36.4 C) (!) 97.5 F (36.4 C)  TempSrc: Axillary  Oral Oral  SpO2: 92% 94% 92% 92%  Weight:      Height:        Intake/Output Summary (Last 24 hours) at 09/04/2020 0739 Last data filed at 09/04/2020 09/06/2020 Gross per 24 hour  Intake --  Output 2300 ml  Net -2300 ml   Filed Weights   Sep 07, 2020 1451 08/30/20 0201  Weight: 72.6 kg 53.9 kg  Examination:  General exam: Appears calm and comfortable Respiratory system: Somewhat diffuse coarse breath sounds without overt wheeze or rales Cardiovascular system: S1 & S2 heard, RRR. No murmurs, rubs,  gallops or clicks. Gastrointestinal system: Abdomen is nondistended, soft and nontender. No organomegaly or masses felt. Normal bowel sounds heard. Central nervous system: Awake alert, unable to assess orientation, does not follow simple commands.  4/5 strength globally based on limited exam Musculoskeletal: No edema. No calf tenderness Skin: No cyanosis. No rashes -postsurgical wound clean dry intact anterior neck  Data Reviewed: I have personally reviewed following labs and imaging studies  CBC Lab Results  Component Value Date   WBC 5.6 09/04/2020   RBC 4.15 (L) 09/04/2020   HGB 11.4 (L) 09/04/2020   HCT 35.3 (L) 09/04/2020   MCV 85.1 09/04/2020   MCH 27.5 09/04/2020   PLT 314 09/04/2020   MCHC 32.3 09/04/2020   RDW 17.4 (H) 09/04/2020   LYMPHSABS 0.7 09/13/2020   MONOABS 0.5 09/07/2020   EOSABS 0.0 08/20/2020   BASOSABS 0.0 08/14/2020     Last metabolic panel Lab Results  Component Value Date   NA 135 09/04/2020   K 3.7 09/04/2020   CL 99 09/04/2020   CO2 25 09/04/2020   BUN <5 (L) 09/04/2020   CREATININE 0.58 (L) 09/04/2020   GLUCOSE 103 (H) 09/04/2020   GFRNONAA >60 09/04/2020   GFRAA >60 07/07/2019   CALCIUM 8.7 (L) 09/04/2020   PHOS 2.6 03/03/2020   PROT 7.5 08/25/2020   ALBUMIN 2.9 (L) 08/28/2020   BILITOT 0.5 08/31/2020   ALKPHOS 105 09/11/2020   AST 16 08/22/2020   ALT 13 08/23/2020   ANIONGAP 11 09/04/2020    CBG (last 3)  Recent Labs    09/03/20 1707 09/04/20 0035 09/04/20 0642  GLUCAP 105* 96 93     GFR: Estimated Creatinine Clearance: 65.5 mL/min (A) (by C-G formula based on SCr of 0.58 mg/dL (L)).  Coagulation Profile: Recent Labs  Lab 08/30/20 1031  INR 1.1    Recent Results (from the past 240 hour(s))  Culture, blood (routine x 2)     Status: None   Collection Time: 09/09/2020  4:03 PM   Specimen: BLOOD RIGHT ARM  Result Value Ref Range Status   Specimen Description   Final    BLOOD RIGHT ARM Performed at [facility],  [address], [place], [place] [address]    Special Requests   Final    BOTTLES DRAWN AEROBIC AND ANAEROBIC Blood Culture adequate volume Performed at [facility], [address], [place], [place] [address]    Culture   Final    NO GROWTH 5 DAYS Performed at [facility], [address], [place], [place] [address]    Report Status 09/03/2020 FINAL  Final  Culture, blood (routine x 2)     Status: None   Collection Time: 08/24/2020  4:03 PM   Specimen: BLOOD RIGHT HAND  Result Value Ref Range Status   Specimen Description   Final    BLOOD RIGHT HAND Performed at [facility], [address] [place], [place] [address]    Special Requests   Final    BOTTLES DRAWN AEROBIC AND ANAEROBIC Blood Culture adequate volume Performed at [facility], [address], [place], [place] [address]    Culture   Final    NO GROWTH 5 DAYS Performed at [facility], [address], [place], [place] [address]    Report Status 09/03/2020 FINAL  Final  Resp Panel by RT-PCR (Flu A&B, Covid) Nasopharyngeal Swab     Status: None   Collection Time: 08/19/2020  6:05 PM   Specimen: Nasopharyngeal Swab; Nasopharyngeal(NP) swabs in vial transport medium  Result Value Ref Range Status   SARS Coronavirus 2 by RT PCR NEGATIVE NEGATIVE Final    Comment: (NOTE) SARS-CoV-2 target nucleic acids are NOT DETECTED.  The SARS-CoV-2 RNA is generally detectable in upper respiratory specimens during the acute phase of infection. The lowest concentration of SARS-CoV-2 viral copies this assay can detect is 138 copies/mL. A negative result does not preclude SARS-Cov-2 infection and should not be used as the sole basis for treatment or other patient management decisions. A negative result may occur with  improper specimen collection/handling, submission of specimen other than nasopharyngeal swab, presence of viral mutation(s) within the areas targeted by this assay, and inadequate number of  viral copies(<138 copies/mL). A negative result must be combined with clinical observations, patient history, and epidemiological information. The expected result is Negative.  Fact Sheet for Patients:  BloggerCourse.com  Fact Sheet for Healthcare Providers:  SeriousBroker.it  This test is no t yet approved or cleared by the [place] FDA and  has been authorized for detection and/or diagnosis of SARS-CoV-2 by FDA under an Emergency Use Authorization (EUA). This EUA will remain  in effect (meaning this test can be used) for the duration of the COVID-19 declaration under Section 564(b)(1) of the Act, 21 U.S.C.section 360bbb-3(b)(1), unless the authorization is terminated  or revoked sooner.       Influenza A by PCR NEGATIVE NEGATIVE Final   Influenza B by PCR NEGATIVE NEGATIVE Final    Comment: (NOTE) The Xpert Xpress SARS-CoV-2/FLU/RSV plus assay is intended as an aid in the diagnosis of influenza from Nasopharyngeal swab specimens and should not be used as a sole basis for treatment. Nasal washings and aspirates are unacceptable for Xpert Xpress SARS-CoV-2/FLU/RSV testing.  Fact Sheet for Patients: BloggerCourse.com  Fact Sheet for Healthcare Providers: SeriousBroker.it  This test is not yet approved or cleared by the [place] FDA and has been authorized for detection and/or diagnosis of SARS-CoV-2 by FDA under an Emergency Use Authorization (EUA). This EUA will remain in effect (meaning this test can be used) for the duration of the COVID-19 declaration under Section 564(b)(1) of the Act, 21 U.S.C. section 360bbb-3(b)(1), unless the authorization is terminated or revoked.  Performed at [facility], [address], [place], [place] [address]   Surgical PCR screen     Status: None   Collection Time: 08/31/20 11:36 AM   Specimen: Nasal Mucosa; Nasal Swab  Result  Value Ref Range Status   MRSA, PCR NEGATIVE NEGATIVE Final   Staphylococcus aureus NEGATIVE NEGATIVE Final    Comment: (NOTE) The Xpert SA Assay (FDA approved for NASAL specimens in patients 58 years of age and older), is one component of a comprehensive surveillance program. It is not intended to diagnose infection nor to guide or monitor treatment. Performed at [facility], [address], [place], [place] [address]   Aerobic/Anaerobic Culture w Gram Stain (surgical/deep wound)     Status: None (Preliminary result)   Collection Time: 09/11/2020  3:12 PM   Specimen: PATH Disc; Tissue  Result Value Ref Range Status   Specimen Description WOUND  Final   Special Requests L3,L4 DISC ASPIRATION  Final   Gram Stain   Final    FEW WBC PRESENT,BOTH PMN AND MONONUCLEAR NO ORGANISMS SEEN Performed at  [facility], [address] [place], [place] [address]    Culture   Final    RARE GRAM NEGATIVE RODS CULTURE REINCUBATED FOR BETTER GROWTH NO ANAEROBES ISOLATED; CULTURE IN PROGRESS FOR 5 DAYS    Report Status PENDING  Incomplete     Radiology Studies: MR CERVICAL SPINE WO CONTRAST  Result Date: 09/03/2020 CLINICAL DATA:  Patient is status post C3-5 ACDF 2 days ago on 09/05/2020. Weakness. Evaluate for epidural hematoma. EXAM: MRI CERVICAL SPINE WITHOUT CONTRAST TECHNIQUE: Multiplanar, multisequence MR imaging of the cervical spine was performed. No intravenous contrast was administered. COMPARISON:  MRI 07-07-2020 FINDINGS: Technical note: Examination is significantly degraded by motion artifact, particularly affecting the axial sequences and sagittal STIR sequence. Alignment: Physiologic. Vertebrae: Interval postoperative changes of C3-C5 ACDF. T2/STIR hyperintense signal within the disc spaces and posterior aspects of the C3 and C4 vertebral bodies is favored to reflect expected postoperative appearance following recent ACDF. No fractures. No evidence of discitis. No suspicious  bone lesions. Congenitally narrowed canal on the basis of short pedicles. Cord: Although degraded by motion. There is suggestion of T2 hyperintense signal within the cord at the C4-5 level (series 6, image 9). No evidence of epidural fluid collection on motion degraded images. Posterior Fossa, vertebral arteries, paraspinal tissues: Soft tissue edema with a small amount of fluid within the anterior soft tissues of the cervical spine extending from C2 to C6. No well-defined fluid collection or hematoma. Disc levels: Please note that evaluation of the cervical levels is limited secondary to significant motion degradation on axial sequences. C2-C3: No canal stenosis. Right worse than left bilateral foraminal stenosis. Unchanged. C3-C4: Interval ACDF. Severe canal stenosis persists at this level secondary to endplate spurring and posterior element hypertrophy. At least moderate bilateral foraminal stenosis. C4-C5: Interval ACDF. Interval improvement in the degree of canal stenosis, now moderate. At least moderate bilateral foraminal stenosis. C5-C6: Similar degree of mild-to-moderate canal stenosis with moderate to severe right and moderate left foraminal stenosis. C6-C7: Similar degree of mild-to-moderate canal stenosis with mild bilateral foraminal stenosis. C7-T1: Similar findings of moderate right foraminal stenosis without canal stenosis. IMPRESSION: 1. Motion degraded exam. 2. Within this limitation, there is no evidence of epidural fluid collection/hematoma or hematoma within the soft tissues of the neck. 3. Interval postoperative changes of C3-C5 ACDF. There is persistent severe canal stenosis at the C3-4 level. 4. Interval improvement in the degree of canal stenosis at the C4-5 level, now moderate. 5. Similar degree of canal and foraminal stenosis at the remaining levels. Electronically Signed   By: Duanne GuessNicholas  Plundo D.O.   On: 09/03/2020 14:37    Scheduled Meds: . atorvastatin  20 mg Oral QHS  .  Chlorhexidine Gluconate Cloth  6 each Topical Daily  . docusate sodium  100 mg Oral Daily  . finasteride  5 mg Oral Daily  . insulin aspart  0-9 Units Subcutaneous Q6H  . LORazepam  1 mg Intravenous Once  . magnesium oxide  400 mg Oral Daily  . senna  2 tablet Oral BID  . sodium chloride flush  3 mL Intravenous Q12H  . tamsulosin  0.4 mg Oral Daily    LOS: 6 days   Carma LeavenWilliam Calina Patrie, DO [facility] 09/04/2020, 7:39 AM  If 7PM-7AM, please contact night-coverage [email or web address]

## 2020-09-04 NOTE — Progress Notes (Signed)
Pt off the floor during rounds, MRI reviewed, shows no evidence of hematoma, does still have some residual stenosis due to posterior pathology - anterior pathology is well decompressed.   -no change in neurosurgical plan of care

## 2020-09-04 NOTE — Significant Event (Signed)
Rapid Response Event Note   Reason for Call :  Red MEWS  Initial Focused Assessment:  I was called by the RN who informed me of this patient who was triggering a red MEWS score.  The patient is tachycardic in the 120's-130's. He is also requiring more oxygen than previously.  Currently he is on 16L NRB.  His lungs are rhonchus bilaterally on auscultation. He does not appear to be in any distress at the moment.  The patient has a very weak cough.   BP 115/56 ECG 135 RR 20 O2 90 on NRB   Interventions:  NT suctioned patient with minimal output Chest xray ordered  Plan of Care:  I recommend that patient have a swallow evaluation to RO aspiration   Event Summary:    Call Time: 0830 Arrival Time: 0830 End Time: 0930  Andrey Spearman, RN

## 2020-09-04 NOTE — Progress Notes (Signed)
   09/04/20 0834  Assess: MEWS Score  BP 99/62  Pulse Rate (!) 131  SpO2 90 %  O2 Device Non-rebreather Mask  Assess: MEWS Score  MEWS Temp 0  MEWS Systolic 1  MEWS Pulse 3  MEWS RR 0  MEWS LOC 0  MEWS Score 4  MEWS Score Color Red  Assess: if the MEWS score is Yellow or Red  Were vital signs taken at a resting state? Yes  Focused Assessment Change from prior assessment (see assessment flowsheet)  Early Detection of Sepsis Score *See Row Information* Low  Treat  MEWS Interventions Administered prn meds/treatments  Pain Scale 0-10  Pain Score 0  Take Vital Signs  Increase Vital Sign Frequency  Red: Q 1hr X 4 then Q 4hr X 4, if remains red, continue Q 4hrs  Escalate  MEWS: Escalate Red: discuss with charge nurse/RN and provider, consider discussing with RRT  Notify: Charge Nurse/RN  Name of Charge Nurse/RN Notified Marcelino Duster  Date Charge Nurse/RN Notified 09/04/20  Time Charge Nurse/RN Notified 0830  Notify: Provider  Provider Name/Title Dr. Natale Milch  Date Provider Notified 09/04/20  Time Provider Notified [phone]  Notification Type Page  Notification Reason Change in status  Provider response See new orders  Date of Provider Response 09/04/20  Time of Provider Response 0820  Notify: Rapid Response  Name of Rapid Response RN Notified Luisa Hart  Date Rapid Response Notified 09/04/20  Time Rapid Response Notified 0830  Document  Patient Outcome Stabilized after interventions  Progress note created (see row info) Yes

## 2020-09-04 NOTE — Progress Notes (Signed)
Attempted to contact primary support person ([name]) x2.  Unable to reach family member and voice mail has not been up.  Contacted Mae Long by phone. Notified of patient's transfer to [facility] and brief update provided on patient status.  Mae Long verbalized understanding and appreciation for update.  No questions from family at this time. Mae Long states that she will notify [name] Long of patient transfer to [facility].

## 2020-09-04 NOTE — Progress Notes (Signed)
Evaluated pt at bedside.  Pt with tachycardia. ST on EKG with no acute signs of ischemia.  Given dose of lasix IV.  Given breathing treatment. On NRB. Mouth breathing.  Coarse breath sounds and upper airway sounds. No stridor.  BP stable at 94/62 with MAP 73.  Will have RT do NT suction.

## 2020-09-05 ENCOUNTER — Inpatient Hospital Stay (HOSPITAL_COMMUNITY): Payer: Medicare Other

## 2020-09-05 DIAGNOSIS — M4712 Other spondylosis with myelopathy, cervical region: Secondary | ICD-10-CM

## 2020-09-05 DIAGNOSIS — M4646 Discitis, unspecified, lumbar region: Secondary | ICD-10-CM

## 2020-09-05 DIAGNOSIS — M4722 Other spondylosis with radiculopathy, cervical region: Principal | ICD-10-CM

## 2020-09-05 DIAGNOSIS — I10 Essential (primary) hypertension: Secondary | ICD-10-CM

## 2020-09-05 DIAGNOSIS — F79 Unspecified intellectual disabilities: Secondary | ICD-10-CM | POA: Diagnosis not present

## 2020-09-05 DIAGNOSIS — E119 Type 2 diabetes mellitus without complications: Secondary | ICD-10-CM | POA: Diagnosis not present

## 2020-09-05 DIAGNOSIS — A498 Other bacterial infections of unspecified site: Secondary | ICD-10-CM

## 2020-09-05 DIAGNOSIS — R339 Retention of urine, unspecified: Secondary | ICD-10-CM

## 2020-09-05 DIAGNOSIS — R29898 Other symptoms and signs involving the musculoskeletal system: Secondary | ICD-10-CM

## 2020-09-05 DIAGNOSIS — R4701 Aphasia: Secondary | ICD-10-CM

## 2020-09-05 LAB — BASIC METABOLIC PANEL
Anion gap: 13 (ref 5–15)
BUN: 23 mg/dL (ref 8–23)
CO2: 24 mmol/L (ref 22–32)
Calcium: 8.8 mg/dL — ABNORMAL LOW (ref 8.9–10.3)
Chloride: 101 mmol/L (ref 98–111)
Creatinine, Ser: 1.07 mg/dL (ref 0.61–1.24)
GFR, Estimated: >60 mL/min (ref >60)
Glucose, Bld: 146 mg/dL — ABNORMAL HIGH (ref 70–99)
Potassium: 4 mmol/L (ref 3.5–5.1)
Sodium: 138 mmol/L (ref 135–145)

## 2020-09-05 LAB — CBC
HCT: 34.9 % — ABNORMAL LOW (ref 39.0–52.0)
Hemoglobin: 10.9 g/dL — ABNORMAL LOW (ref 13.0–17.0)
MCH: 26.8 pg (ref 26.0–34.0)
MCHC: 31.2 g/dL (ref 30.0–36.0)
MCV: 86 fL (ref 80.0–100.0)
Platelets: 372 10*3/uL (ref 150–400)
RBC: 4.06 MIL/uL — ABNORMAL LOW (ref 4.22–5.81)
RDW: 17.8 % — ABNORMAL HIGH (ref 11.5–15.5)
WBC: 7.2 10*3/uL (ref 4.0–10.5)
nRBC: 0.3 % — ABNORMAL HIGH (ref 0.0–0.2)

## 2020-09-05 LAB — GLUCOSE, CAPILLARY
Glucose-Capillary: 124 mg/dL — ABNORMAL HIGH (ref 70–99)
Glucose-Capillary: 155 mg/dL — ABNORMAL HIGH (ref 70–99)
Glucose-Capillary: 143 mg/dL — ABNORMAL HIGH (ref 70–99)

## 2020-09-05 MED ORDER — FUROSEMIDE 10 MG/ML IJ SOLN
INTRAMUSCULAR | Status: AC
  Filled 2020-09-05: qty 4

## 2020-09-05 MED ORDER — SODIUM CHLORIDE 0.9 % IV SOLN
2.0000 g | INTRAVENOUS | Status: DC
  Administered 2020-09-05 – 2020-09-07 (×3): 2 g via INTRAVENOUS
  Filled 2020-09-05 (×2): qty 2
  Filled 2020-09-05: qty 20
  Filled 2020-09-05: qty 2

## 2020-09-05 NOTE — Progress Notes (Signed)
Floor coverage notified of need for restraint order. Pt pulling off oxygen every few seconds despite mittens. Pt becoming agitated. Orders received for safety sitter.

## 2020-09-05 NOTE — Evaluation (Signed)
Clinical/Bedside Swallow Evaluation Patient Details  Name: [name] MRN: [id number] Date of Birth: [birth year]  Today's Date: 09/05/2020 Time: SLP Start Time (ACUTE ONLY): 1022 SLP Stop Time (ACUTE ONLY): 1037 SLP Time Calculation (min) (ACUTE ONLY): 15.82 min  Past Medical History:  Past Medical History:  Diagnosis Date  . Diabetes mellitus without complication (HCC)   . History of gout   . History of kidney stones   . Hypertension   . Mentally challenged   . Urinary retention    Past Surgical History:  Past Surgical History:  Procedure Laterality Date  . ANTERIOR CERVICAL DECOMP/DISCECTOMY FUSION N/A 09/10/2020   Procedure: ANTERIOR CERVICAL DISCECTOMY FUSION CERVICAL THREE-FOUR AND  ,CERVICAL  FOUR -FIVE;  Surgeon: Coletta Memos, MD;  Location: [facility];  Service: Neurosurgery;  Laterality: N/A;  . CHOLECYSTECTOMY N/A 05/25/2019   Procedure: OPEN CHOLECYSTECTOMY;  Surgeon: [name], MD;  Location: [facility];  Service: General;  Laterality: N/A;  . CYSTOSCOPY W/ URETERAL STENT PLACEMENT Bilateral 06/20/2016   Procedure: CYSTOSCOPY WITH RETROGRADE PYELOGRAM/URETERAL STENT PLACEMENT;  Surgeon: Malen Gauze, MD;  Location: [facility];  Service: Urology;  Laterality: Bilateral;  . CYSTOSCOPY WITH RETROGRADE PYELOGRAM, URETEROSCOPY AND STENT PLACEMENT Bilateral 08/20/2016   Procedure: CYSTOSCOPY WITH BILATERAL RETROGRADE PYELOGRAM, BILATERAL URETEROSCOPY,  LASER LITHOTRIPSY OF RIGHT URETERAL CALCULI, STONE BASKET EXTRACTION LEFT URETERAL CALCULI  AND BILATERAL URETERAL STENT EXCHANGE;  Surgeon: Malen Gauze, MD;  Location: [facility];  Service: Urology;  Laterality: Bilateral;  . CYSTOSCOPY WITH RETROGRADE PYELOGRAM, URETEROSCOPY AND STENT PLACEMENT Left 01/14/2019   Procedure: CYSTOSCOPY WITH LEFT RETROGRADE PYELOGRAM, LEFT URETEROSCOPY AND LEFT URETERAL STENT PLACEMENT;  Surgeon: Malen Gauze, MD;  Location: [facility];  Service: Urology;  Laterality: Left;  . CYSTOSCOPY WITH  URETHRAL DILATATION  01/14/2019   Procedure: CYSTOSCOPY WITH URETHRAL DILATATION;  Surgeon: Malen Gauze, MD;  Location: [facility];  Service: Urology;;  . HOLMIUM LASER APPLICATION Bilateral 08/20/2016   Procedure: HOLMIUM LASER APPLICATION;  Surgeon: Malen Gauze, MD;  Location: [facility];  Service: Urology;  Laterality: Bilateral;  . HOLMIUM LASER APPLICATION Left 01/14/2019   Procedure: HOLMIUM LASER APPLICATION;  Surgeon: Malen Gauze, MD;  Location: [facility];  Service: Urology;  Laterality: Left;  . IR CATHETER TUBE CHANGE  07/04/2018  . IR EXCHANGE BILIARY DRAIN  06/09/2018  . IR EXCHANGE BILIARY DRAIN  10/20/2018  . IR EXCHANGE BILIARY DRAIN  01/12/2019  . IR EXCHANGE BILIARY DRAIN  04/07/2019  . IR LUMBAR DISC ASPIRATION W/IMG GUIDE  08/17/2020  . IR PERC CHOLECYSTOSTOMY  05/24/2018  . LAPAROTOMY N/A 08/25/2018   Procedure: EXPLORATORY LAPAROTOMY reduction of volvulus;  Surgeon: [name], MD;  Location: [facility];  Service: General;  Laterality: N/A;  . LAPAROTOMY N/A 05/25/2019   Procedure: EXPLORATORY LAPAROTOMY;  Surgeon: [name], MD;  Location: [facility];  Service: General;  Laterality: N/A;  . RADIOLOGY WITH ANESTHESIA N/A 08/24/2020   Procedure: IR WITH ANESTHESIA;  Surgeon: Julieanne Cotton, MD;  Location: [facility] OR;  Service: Radiology;  Laterality: N/A;   HPI:  [name] is a 70 y.o. male who was sent to the ED from SNF with reports of abnormal MRI. CXR 5/22: Abnormal bilateral perihilar opacity suspicious for flash pulmonary edema. Aspiration or infection felt less likely by radiologist. MRI 5/16: No acute abnormality identified. MR cervical spine: postoperative changes of C3-C5 ACDF. Persistent severe canal stenosis at the C3-4 level. Neurosurgery was consulted on admission who recommended IR consult for aspiration.  PMH: intellectual disability with inability to communicate, diabetes mellitus, hypertension.   Assessment / Plan / Recommendation Clinical Impression  [name]  was seen for bedside swallow evaluation. [name] was unable to provide information regarding his baseline swallowing function. However, per OT's note on 5/20, [name]'s aunt reported that the [name] "has had some issues with swallowing and needs food cut up small." A complete oral mechanism exam was not conducted due to [name]'s difficulty following commands. Oral mucosa was dry and he was edentulous. [name] demonstrated symptoms of pharyngeal dysphagia characterized by 3-5 swallows per bolus and signs of aspiration with all consistencies. Hyolaryngeal movement subjectively appeared reduced upon palpation. A modified barium swallow study is recommended to further assess swallow function. It is currently scheduled for today at 1230 and it is recommended that p.o. intake be deferred until the modified barium swallow study is conducted. SLP Visit Diagnosis: Dysphagia, unspecified (R13.10)    Aspiration Risk  Moderate aspiration risk    Diet Recommendation NPO   Medication Administration: Via alternative means    Other  Recommendations Oral Care Recommendations: Oral care QID   Follow up Recommendations  (TBD)      Frequency and Duration min 2x/week  2 weeks       Prognosis Prognosis for Safe Diet Advancement: Fair Barriers to Reach Goals: Cognitive deficits;Severity of deficits      Swallow Study   General Date of Onset: 09/04/20 HPI: [name] is a 70 y.o. male who was sent to the ED from SNF with reports of abnormal MRI. CXR 5/22: Abnormal bilateral perihilar opacity suspicious for flash pulmonary edema. Aspiration or infection felt less likely by radiologist. MRI 5/16: No acute abnormality identified. MR cervical spine: postoperative changes of C3-C5 ACDF. Persistent severe canal stenosis at the C3-4 level. Neurosurgery was consulted on admission who recommended IR consult for aspiration. PMH: intellectual disability with inability to communicate, diabetes mellitus, hypertension. Type of Study: Bedside Swallow  Evaluation Previous Swallow Assessment: none Diet Prior to this Study: Regular;Thin liquids Temperature Spikes Noted: No Respiratory Status: Nasal cannula History of Recent Intubation: No Behavior/Cognition: Alert;Cooperative;Pleasant mood Oral Cavity Assessment: Dry Oral Care Completed by SLP: No Oral Cavity - Dentition: Edentulous Vision: Functional for self-feeding Self-Feeding Abilities: Total assist Patient Positioning: Upright in bed;Postural control adequate for testing Baseline Vocal Quality: Not observed Volitional Cough: Cognitively unable to elicit Volitional Swallow: Unable to elicit    Oral/Motor/Sensory Function Overall Oral Motor/Sensory Function:  (UTA)   Ice Chips Ice chips: Impaired Presentation: Spoon Pharyngeal Phase Impairments: Multiple swallows;Cough - Immediate;Cough - Delayed   Thin Liquid Thin Liquid: Impaired Presentation: Spoon Pharyngeal  Phase Impairments: Multiple swallows;Cough - Immediate    Nectar Thick Nectar Thick Liquid: Not tested   Honey Thick Honey Thick Liquid: Not tested   Puree Puree: Impaired Presentation: Spoon Pharyngeal Phase Impairments: Multiple swallows;Cough - Immediate;Throat Clearing - Immediate   Solid     Solid: Not tested     Lyliana Dicenso I. Vear Clock, MS, CCC-SLP Acute Rehabilitation Services Office number [phone] Pager [phone]  Scheryl Marten 09/05/2020,10:51 AM

## 2020-09-05 NOTE — NC FL2 (Signed)
[place] MEDICAID FL2 LEVEL OF CARE SCREENING TOOL     IDENTIFICATION  Patient Name: [name] Birthdate: July 29, 1950 Sex: male Admission Date (Current Location): 08/21/2020  [place] and [place] Number:  Haynes Bast [id number] O Facility and Address:  [facility], [address] [place], [place] [address]      Provider Number: [id number]  Attending Physician Name and Address:  Azucena Fallen, MD  Relative Name and Phone Number:  Donald Siva [phone]    Current Level of Care: Hospital Recommended Level of Care: Skilled Nursing Facility Prior Approval Number:    Date Approved/Denied:   PASRR Number: [id number]  Discharge Plan: SNF    Current Diagnoses: Patient Active Problem List   Diagnosis Date Noted  . Cervical spondylosis with myelopathy and radiculopathy 06-Sep-2020  . Lower extremity weakness 08/23/2020  . SIRS (systemic inflammatory response syndrome) (HCC) 06/15/2020  . Goals of care, counseling/discussion   . Encounter for hospice care discussion   . COVID-19 virus infection 02/28/2020  . Palliative care by specialist   . Pneumoperitoneum 07/04/2019  . Intra-abdominal free air of unknown etiology 07/04/2019  . Cystitis   . Chronic cholecystitis 05/21/2019  . Chronic constipation 03/31/2019  . Cholecystostomy care (HCC) 11/04/2018  . Acute lower UTI 09/18/2018  . Abdominal distention 09/18/2018  . Protein-calorie malnutrition, severe (HCC) 09/09/2018  . Acute blood loss anemia 09/09/2018  . Gangrenous cholecystitis 09/05/2018  . Pneumatosis intestinalis   . Gall bladder disease 08/25/2018  . Small bowel ischemia (HCC)   . Acute gangrenous cholecystitis 05/24/2018  . SBO (small bowel obstruction) (HCC) 05/24/2018  . Abdominal distension   . Pressure injury of skin 04/09/2018  . C. difficile diarrhea 04/08/2018  . Bilateral hydronephrosis 04/08/2018  . Pneumoperitoneum of unknown etiology   . Diarrhea  04/07/2018  . Nonverbal 04/07/2018  . Hydronephrosis with urinary obstruction due to ureteral calculus 06/20/2016  . Sepsis secondary to UTI (HCC) 06/20/2016  . Fever 06/19/2016  . Generalized weakness 06/19/2016  . Diabetes mellitus type 2, noninsulin dependent (HCC) 06/19/2016  . Severe sepsis with septic shock (HCC) 06/19/2016  . UTI (urinary tract infection) 06/19/2016  . Hematuria 06/19/2016  . AKI (acute kidney injury) (HCC) 06/19/2016  . Hyponatremia 06/19/2016  . Hypokalemia 06/19/2016  . BPH (benign prostatic hyperplasia) 06/19/2016  . Urine retention 05/07/2014  . Intellectual disability 05/07/2014  . Hyperlipidemia 05/07/2014  . HTN (hypertension) 05/07/2014  . Renal calculus 01/14/2012    Orientation RESPIRATION BLADDER Height & Weight      (pt is non verbal)  O2 Indwelling catheter Weight: 118 lb 13.3 oz (53.9 kg) Height:  6' 0.01" (182.9 cm)  BEHAVIORAL SYMPTOMS/MOOD NEUROLOGICAL BOWEL NUTRITION STATUS      Incontinent Diet (see discharge summary)  AMBULATORY STATUS COMMUNICATION OF NEEDS Skin   Total Care Non-Verbally Surgical wounds                       Personal Care Assistance Level of Assistance  Total care Bathing Assistance: Maximum assistance Feeding assistance: Limited assistance Dressing Assistance: Maximum assistance Total Care Assistance: Maximum assistance   Functional Limitations Info  Sight,Hearing,Speech Sight Info: Adequate Hearing Info: Adequate Speech Info: Impaired    SPECIAL CARE FACTORS FREQUENCY  PT (By licensed PT),OT (By licensed OT)     PT Frequency: 5x week OT Frequency: 5x week            Contractures      Additional Factors Info  Code Status,Allergies,Insulin  Sliding Scale Code Status Info: full Allergies Info: NKA   Insulin Sliding Scale Info: Novolog, 0-9 units, Q6.  See discharge summary       Current Medications (09/05/2020):  This is the current hospital active medication list Current  Facility-Administered Medications  Medication Dose Route Frequency Provider Last Rate Last Admin  . 0.9 %  sodium chloride infusion  250 mL Intravenous Continuous Coletta Memos, MD   Stopping Infusion hung by another clincian at 08/18/2020 2143  . acetaminophen (TYLENOL) tablet 650 mg  650 mg Oral Q4H PRN Coletta Memos, MD       Or  . acetaminophen (TYLENOL) suppository 650 mg  650 mg Rectal Q4H PRN Coletta Memos, MD      . atorvastatin (LIPITOR) tablet 20 mg  20 mg Oral QHS Coletta Memos, MD   20 mg at 09/03/20 2224  . Chlorhexidine Gluconate Cloth 2 % PADS 6 each  6 each Topical Daily Coletta Memos, MD   6 each at 09/03/20 [phone]  . cyclobenzaprine (FLEXERIL) tablet 10 mg  10 mg Oral TID PRN Coletta Memos, MD   10 mg at 09/03/20 0928  . docusate sodium (COLACE) capsule 100 mg  100 mg Oral Daily Coletta Memos, MD      . finasteride (PROSCAR) tablet 5 mg  5 mg Oral Daily Coletta Memos, MD   5 mg at 09/03/20 2297  . HYDROcodone-acetaminophen (NORCO/VICODIN) 5-325 MG per tablet 1-2 tablet  1-2 tablet Oral Q4H PRN Barnett Abu, MD   2 tablet at 09/03/20 [phone]  . insulin aspart (novoLOG) injection 0-9 Units  0-9 Units Subcutaneous Q6H Coletta Memos, MD   1 Units at 09/05/20 0535  . LORazepam (ATIVAN) injection 1 mg  1 mg Intravenous Once Azucena Fallen, MD      . magnesium oxide (MAG-OX) tablet 400 mg  400 mg Oral Daily Coletta Memos, MD   400 mg at 09/03/20 1194  . menthol-cetylpyridinium (CEPACOL) lozenge 3 mg  1 lozenge Oral PRN Coletta Memos, MD       Or  . phenol (CHLORASEPTIC) mouth spray 1 spray  1 spray Mouth/Throat PRN Coletta Memos, MD      . ondansetron (ZOFRAN) tablet 4 mg  4 mg Oral Q6H PRN Coletta Memos, MD       Or  . ondansetron (ZOFRAN) injection 4 mg  4 mg Intravenous Q6H PRN Coletta Memos, MD      . senna (SENOKOT) tablet 17.2 mg  2 tablet Oral BID Coletta Memos, MD   17.2 mg at 09/03/20 2224  . sodium chloride flush (NS) 0.9 % injection 3 mL  3 mL Intravenous Q12H Coletta Memos, MD   3 mL at 09/05/20 0745  . sodium chloride flush (NS) 0.9 % injection 3 mL  3 mL Intravenous PRN Coletta Memos, MD      . tamsulosin (FLOMAX) capsule 0.4 mg  0.4 mg Oral Daily Coletta Memos, MD   0.4 mg at 09/03/20 1740     Discharge Medications: Please see discharge summary for a list of discharge medications.  Relevant Imaging Results:  Relevant Lab Results:   Additional Information SS# [id number], [place], [place]

## 2020-09-05 NOTE — Progress Notes (Signed)
Pt very restless and continues pulling at vital lines. Pt has pulled out iv and constantly taking off oxygen despite mittens in place.

## 2020-09-05 NOTE — TOC Progression Note (Signed)
Transition of Care [facility]) - Progression Note    Patient Details  Name: [name] MRN: [id number] Date of Birth: October 02, 1950  Transition of Care [facility]) CM/SW Contact  Lorri Frederick, LCSW Phone Number: 09/05/2020, 11:17 AM  Clinical Narrative:   [name] completed, CSW spoke with Eunice Blase at [facility] and confirmed pt is LTC resident there.      Expected Discharge Plan: Skilled Nursing Facility Barriers to Discharge: Continued Medical Work up  Expected Discharge Plan and Services Expected Discharge Plan: Skilled Nursing Facility                                               Social Determinants of Health (SDOH) Interventions    Readmission Risk Interventions Readmission Risk Prevention Plan 05/26/2019 09/20/2018 09/19/2018  Transportation Screening Complete Complete Complete  PCP or Specialist Appt within 3-5 Days Not Complete - -  HRI or Home Care Consult Complete - -  Social Work Consult for Recovery Care Planning/Counseling Complete - -  Palliative Care Screening Not Complete - -  Medication Review Oceanographer) Complete Complete Complete  PCP or Specialist appointment within 3-5 days of discharge - Complete Complete  PCP/Specialist Appt Not Complete comments - - SNF resident. Sees facility MD  HRI or Home Care Consult - Not Complete -  HRI or Home Care Consult Pt Refusal Comments - Patient is discharging to SNF for rehab -  SW Recovery Care/Counseling Consult - Complete -  Palliative Care Screening - Not Applicable -  Skilled Nursing Facility - Complete -  Some recent data might be hidden

## 2020-09-05 NOTE — Progress Notes (Signed)
Physical Therapy Treatment Patient Details Name: [name] MRN: [id number] DOB: 05/03/1950 Today's Date: 09/05/2020    History of Present Illness [name] is a 70 y.o. male with medical history significant for intellectual disability, diabetes mellitus, hypertension.  Patient was sent to the ED from nursing home with reports of abnormal MRI.  History is mostly obtained from chart review as patient's is unable to communicate due to baseline intellectual disability.  Over the past month, patient has been having bilateral lower extremity weakness, with urinary retention.  At the of my evaluation he shakes his head - No to any other complaints.     Patient had thoracic and lumbar MRI 08/25/20.  With lumbar MRI showing abnormal appearance of L3-L4 disc space with prominent edema in the L3-L4 vertebral bodies and possible small endplate erosions but no significant paravertebral soft tissue inflammation.  Findings indeterminate for infectious discitis-osteomyelitis versus prominent degenerative edema.  CT cervical spine shows stenosis. S/P ACDF at C3/4, 4/5 09/10/2020 as well as L3-4 disc aspiration.    PT Comments    Patient with no activation of legs in squat-pivot transfer with +2 total assist. Sitting balance at EOB slightly worse today (min assist today; supervision on evaluation). Pt leaning to his right with left foot coming off the floor unless assisted to maintain his balance. Will need to focus on leg strengthening (if possible to strengthen) and recommend using lift for his dependent transfers.     Follow Up Recommendations  SNF     Equipment Recommendations  None recommended by PT;Other (comment) (TBD)    Recommendations for Other Services       Precautions / Restrictions Precautions Precautions: Fall Precaution Comments: Pt with very little movement in LEs and little effort to assist with mobiltiy.    Mobility  Bed Mobility Overal bed mobility: Needs Assistance Bed  Mobility: Supine to Sit;Rolling;Sit to Supine Rolling: Total assist (from left towards right)   Supine to sit: +2 for physical assistance;Total assist Sit to supine: Total assist;+2 for physical assistance   General bed mobility comments: Pt moving his right arm to try to reach railing to assist, however he was not able to hold rail.    Transfers Overall transfer level: Needs assistance Equipment used: None Transfers: Squat Pivot Transfers     Squat pivot transfers: Total assist;+2 physical assistance     General transfer comment: pt reaching to grab armrest of chair and nodding yes that he wants to get into chair. No WB through his legs noted. Repeated return to bed transfer as pt incontinent of bowels once in the chair.  Ambulation/Gait             General Gait Details: unable   Stairs             Wheelchair Mobility    Modified Rankin (Stroke Patients Only)       Balance Overall balance assessment: Needs assistance Sitting-balance support: Feet supported Sitting balance-[name]: Poor Sitting balance - Comments: required external support                                    Cognition Arousal/Alertness: Awake/alert Behavior During Therapy: Anxious Overall Cognitive Status: History of cognitive impairments - at baseline  General Comments: Pt at baseline with cognition per aunt.      Exercises      General Comments        Pertinent Vitals/Pain Pain Assessment: Faces Faces Pain Scale: Hurts even more Pain Location: nodded "yes" to neck hurting after transfer Pain Descriptors / Indicators: Grimacing Pain Intervention(s): Limited activity within patient's tolerance;Monitored during session;Repositioned    Home Living                      Prior Function            PT Goals (current goals can now be found in the care plan section) Acute Rehab PT Goals Patient Stated  Goal: to return to SNF Time For Goal Achievement: 09/13/20 Potential to Achieve Goals: Good Progress towards PT goals: Not progressing toward goals - comment (severe weakness bil LEs; required more assist in sitting)    Frequency    Min 3X/week      PT Plan Current plan remains appropriate    Co-evaluation              AM-PAC PT "6 Clicks" Mobility   Outcome Measure  Help needed turning from your back to your side while in a flat bed without using bedrails?: Total Help needed moving from lying on your back to sitting on the side of a flat bed without using bedrails?: Total Help needed moving to and from a bed to a chair (including a wheelchair)?: Total Help needed standing up from a chair using your arms (e.g., wheelchair or bedside chair)?: Total Help needed to walk in hospital room?: Total Help needed climbing 3-5 steps with a railing? : Total 6 Click Score: 6    End of Session Equipment Utilized During Treatment: Other (comment) (bed pad under pelvis) Activity Tolerance: Patient tolerated treatment well Patient left: with call bell/phone within reach;with bed alarm set;in bed Nurse Communication: Mobility status PT Visit Diagnosis: Other abnormalities of gait and mobility (R26.89);Muscle weakness (generalized) (M62.81);Pain     Time: 4166-0630 PT Time Calculation (min) (ACUTE ONLY): 20 min  Charges:  $Therapeutic Activity: 8-22 mins                      Jerolyn Center, PT Pager [phone]    Zena Amos 09/05/2020, 12:28 PM

## 2020-09-05 NOTE — Progress Notes (Signed)
Modified Barium Swallow Progress Note  Patient Details  Name: [name] MRN: [id number] Date of Birth: [birth year]  Today's Date: 09/05/2020  Modified Barium Swallow completed.  Full report located under Chart Review in the Imaging Section.  Brief recommendations include the following:  Clinical Impression  Pt presents with pharyngeal dysphagia characterized by reduced hyolaryngeal elavation, reduced anterior laryngeal movement and a pharyngeal delay. He demonstrated absent epiglottic inversion and inadequate airway closure which consistently resulted in aspiration (PAS 7). Vallecular residue and pyriform sinus residue were noted and did not clear despite pt's multiple attempts at achieving epiglottic inversion. Pt was unable to demonstrate compensatory strategies due to his difficult following commands, and additional boluses were deferred due to pt's performance. Aspiration consistently resulted in coughing, but this was ineffective in expelling aspirated material. Considering his presentation and recent ACDF, SLP questions the potential involvement of superior laryngeal nerve. Pt's risk of aspiration is high at this time and an NPO status is therefore recommended with short-term enteral nutrition since SLP in unsure of his potential rate of recovery. Pt's case was discussed with referring MD, Dr. Natale Milch, who was in agreement with NPO status. Dr. Natale Milch expressed that the pt was eating three days prior to this and that he is typically able to follow commands. He further stated that the pt had some hypoxia and mental status changes from meds which should improve over the next few days since those meds have been discontinued. Dr. Natale Milch indicated that enteral nutrition will be considered if pt is still unable to safely tolerate p.o. intake by 5/25 or 5/26. SLP will follow for dysphagia treatment.   Swallow Evaluation Recommendations       SLP Diet Recommendations: NPO;Alternative means  - temporary       Medication Administration: Via alternative means               Oral Care Recommendations: Oral care QID;Staff/trained caregiver to provide oral care      [name], MS, CCC-SLP Acute Rehabilitation Services Office number [phone] Pager [phone]   Scheryl Marten 09/05/2020,2:21 PM

## 2020-09-05 NOTE — Progress Notes (Signed)
PROGRESS NOTE    [name]  ZOX:[id number] DOB: [birth year] DOA: 08/19/2020 PCP: Pearson Grippe, MD   Brief Narrative: [name] is a 69 y.o. male with a history of intellectual disability, diabetes mellitus.  Patient presented from a nursing home secondary bilateral lower extremity weakness, urinary retention and abnormal MRI.  Patient was found to have an L3-L4 disc space edema concerning for possible discitis/osteomyelitis versus degenerative edema.  Neurosurgery was consulted on admission who recommended IR consult for aspiration.  Blood cultures obtained on admission.  Antibiotics held secondary to stable patient.   Assessment & Plan:   Principal Problem:   Lower extremity weakness Active Problems:   Urine retention   Intellectual disability   HTN (hypertension)   Diabetes mellitus type 2, noninsulin dependent (HCC)   Nonverbal   Cervical spondylosis with myelopathy and radiculopathy   Severe spinal stenosis C3/4 and C4/5 with cord compression;  - Status post: Anterior Cervical decompression C3-5; Arthrodesis C3/4,4/5 with 7, and 8mm structural allografts; Anterior instrumentation(Globus resonate) C3-5" - Neurosurgery following, Dr. Franky Macho consulted on admission -Tolerated procedure quite well, awake alert this morning tolerating breakfast without difficulty -Left arm weakness ongoing - unclear timing/onset given patient's mental status - MRI today shows severe C 3/4 stenosis - neurology aware  Sepsis secondary to E. coli infection of the lumbar spine -History of UTI just last month, ID consulted for further evaluation and recommendations -Could be the cause of patient's ongoing mental status changes as below -This infection was likely present prior to admission given timing of biopsy and sample, did not meet sepsis criteria until recently however  Acute agitation/mental status alteration, ongoing Acute hypoxic respiratory failure, ongoing Secondary to aspiration  pneumonitis in the setting of above - High risk for aspiration/hypervolemia given mental status changes - On 5/21 - required ativan for repeat MRI due to anxiety/behavoir issues -Concurrent sepsis with E. coli infection of the spine as above - Mental status somewhat improving over the past 48 hours - Continues on supplemental oxygen today -extremely noncompliant with supplemental oxygen, have recommended restraints and mittens to the nurses has necessary to keep oxygen on while he still requires it  Lactic acidosis -Previously resolved in the setting of hypovolemia  Diabetes mellitus, type 2 Hemoglobin A1C of 7.1%. On metformin as an outpatient.  Continue sliding scale, hypoglycemic protocol  Hyperlipidemia -Continue Lipitor  BPH Urinary retention, chronic - Known history of BPH and chronic Foley use.   - Patient presented to the hospital with a Foley catheter already in place. -Continue Flomax and finasteride  Intellectual disability Non-verbal at baseline Does appear to follow simple commands when he wants to  DVT prophylaxis: SCDs Code Status:   Code Status: Full Code Family Communication: At bedside Disposition Plan: Back to facility once acute symptoms have resolved as above and stable from a surgical standpoint  Consultants:   Neurosurgery  Interventional radiology  Infectious disease  Procedures:   None  Antimicrobials:  None   Subjective: No acute issues or events overnight, ongoing hypoxia appears to be improving, spinal biopsy shows E. coli infection per culture, may be the reason for patient's ongoing mental status depression.  Review of systems remains severely limited given patient's baseline  Objective: Vitals:   09/04/20 1700 09/04/20 1924 09/04/20 2336 09/05/20 0449  BP: 91/62 (!) 91/58 (!) 110/57 (!) 99/59  Pulse: (!) 135 (!) 136 (!) 133 (!) 122  Resp: (!) Temp:  98.5 F (36.9 C) 98.6 F (  37 C) 98 F (36.7 C)  TempSrc:   Axillary Oral Oral  SpO2: 93% 96% 97% 97%  Weight:      Height:        Intake/Output Summary (Last 24 hours) at 09/05/2020 0706 Last data filed at 09/05/2020 0100 Gross per 24 hour  Intake --  Output 1070 ml  Net -1070 ml   Filed Weights   08/30/2020 1451 08/30/20 0201  Weight: 72.6 kg 53.9 kg    Examination:  General exam: Somewhat agitated in bedside chair working with PT Respiratory system: Somewhat diffuse coarse breath sounds without overt wheeze or rales Cardiovascular system: S1 & S2 heard, RRR. No murmurs, rubs, gallops or clicks. Gastrointestinal system: Abdomen is nondistended, soft and nontender. No organomegaly or masses felt. Normal bowel sounds heard. Central nervous system: Awake alert, unable to assess orientation, does not follow simple commands.  4/5 strength globally based on limited exam Musculoskeletal: No edema. No calf tenderness Skin: No cyanosis. No rashes -postsurgical wound clean dry intact anterior neck  Data Reviewed: I have personally reviewed following labs and imaging studies  CBC Lab Results  Component Value Date   WBC 5.6 09/04/2020   RBC 4.15 (L) 09/04/2020   HGB 11.4 (L) 09/04/2020   HCT 35.3 (L) 09/04/2020   MCV 85.1 09/04/2020   MCH 27.5 09/04/2020   PLT 314 09/04/2020   MCHC 32.3 09/04/2020   RDW 17.4 (H) 09/04/2020   LYMPHSABS 0.7 09/10/2020   MONOABS 0.5 09/06/2020   EOSABS 0.0 09/07/2020   BASOSABS 0.0 08/30/2020     Last metabolic panel Lab Results  Component Value Date   NA 135 09/04/2020   K 3.7 09/04/2020   CL 99 09/04/2020   CO2 25 09/04/2020   BUN <5 (L) 09/04/2020   CREATININE 0.58 (L) 09/04/2020   GLUCOSE 103 (H) 09/04/2020   GFRNONAA >60 09/04/2020   GFRAA >60 07/07/2019   CALCIUM 8.7 (L) 09/04/2020   PHOS 2.6 03/03/2020   PROT 7.5 08/23/2020   ALBUMIN 2.9 (L) 08/27/2020   BILITOT 0.5 08/19/2020   ALKPHOS 105 08/17/2020   AST 16 08/20/2020   ALT 13 09/04/2020   ANIONGAP 11 09/04/2020    CBG (last 3)   Recent Labs    09/04/20 1235 09/04/20 2339 09/05/20 0534  GLUCAP 102* 118* 124*     GFR: Estimated Creatinine Clearance: 65.5 mL/min (A) (by C-G formula based on SCr of 0.58 mg/dL (L)).  Coagulation Profile: Recent Labs  Lab 08/30/20 1031  INR 1.1    Recent Results (from the past 240 hour(s))  Culture, blood (routine x 2)     Status: None   Collection Time: 09/11/2020  4:03 PM   Specimen: BLOOD RIGHT ARM  Result Value Ref Range Status   Specimen Description   Final    BLOOD RIGHT ARM Performed at [facility], [address] [place], [place] [address]    Special Requests   Final    BOTTLES DRAWN AEROBIC AND ANAEROBIC Blood Culture adequate volume Performed at [facility], [address] [place], [place] [address]    Culture   Final    NO GROWTH 5 DAYS Performed at [facility], [address] [place], [place] [address]    Report Status 09/03/2020 FINAL  Final  Culture, blood (routine x 2)     Status: None   Collection Time: 08/18/2020  4:03 PM   Specimen: BLOOD RIGHT HAND  Result Value Ref Range Status   Specimen Description  Final    BLOOD RIGHT HAND Performed at [facility], [address] [place], [place] [address]    Special Requests   Final    BOTTLES DRAWN AEROBIC AND ANAEROBIC Blood Culture adequate volume Performed at [facility], [address] [place], [place] [address]    Culture   Final    NO GROWTH 5 DAYS Performed at [facility], [address], [place], [place] [address]    Report Status 09/03/2020 FINAL  Final  Resp Panel by RT-PCR (Flu A&B, Covid) Nasopharyngeal Swab     Status: None   Collection Time: 09/11/2020  6:05 PM   Specimen: Nasopharyngeal Swab; Nasopharyngeal(NP) swabs in vial transport medium  Result Value Ref Range Status   SARS Coronavirus 2 by RT PCR NEGATIVE NEGATIVE Final    Comment: (NOTE) SARS-CoV-2 target nucleic acids are NOT DETECTED.  The SARS-CoV-2 RNA is generally detectable in upper  respiratory specimens during the acute phase of infection. The lowest concentration of SARS-CoV-2 viral copies this assay can detect is 138 copies/mL. A negative result does not preclude SARS-Cov-2 infection and should not be used as the sole basis for treatment or other patient management decisions. A negative result may occur with  improper specimen collection/handling, submission of specimen other than nasopharyngeal swab, presence of viral mutation(s) within the areas targeted by this assay, and inadequate number of viral copies(<138 copies/mL). A negative result must be combined with clinical observations, patient history, and epidemiological information. The expected result is Negative.  Fact Sheet for Patients:  BloggerCourse.com  Fact Sheet for Healthcare Providers:  SeriousBroker.it  This test is no t yet approved or cleared by the [place] FDA and  has been authorized for detection and/or diagnosis of SARS-CoV-2 by FDA under an Emergency Use Authorization (EUA). This EUA will remain  in effect (meaning this test can be used) for the duration of the COVID-19 declaration under Section 564(b)(1) of the Act, 21 U.S.C.section 360bbb-3(b)(1), unless the authorization is terminated  or revoked sooner.       Influenza A by PCR NEGATIVE NEGATIVE Final   Influenza B by PCR NEGATIVE NEGATIVE Final    Comment: (NOTE) The Xpert Xpress SARS-CoV-2/FLU/RSV plus assay is intended as an aid in the diagnosis of influenza from Nasopharyngeal swab specimens and should not be used as a sole basis for treatment. Nasal washings and aspirates are unacceptable for Xpert Xpress SARS-CoV-2/FLU/RSV testing.  Fact Sheet for Patients: BloggerCourse.com  Fact Sheet for Healthcare Providers: SeriousBroker.it  This test is not yet approved or cleared by the [place] FDA and has been  authorized for detection and/or diagnosis of SARS-CoV-2 by FDA under an Emergency Use Authorization (EUA). This EUA will remain in effect (meaning this test can be used) for the duration of the COVID-19 declaration under Section 564(b)(1) of the Act, 21 U.S.C. section 360bbb-3(b)(1), unless the authorization is terminated or revoked.  Performed at [facility], [address] [place], [place] [address]   Surgical PCR screen     Status: None   Collection Time: 08/31/20 11:36 AM   Specimen: Nasal Mucosa; Nasal Swab  Result Value Ref Range Status   MRSA, PCR NEGATIVE NEGATIVE Final   Staphylococcus aureus NEGATIVE NEGATIVE Final    Comment: (NOTE) The Xpert SA Assay (FDA approved for NASAL specimens in patients 12 years of age and older), is one component of a comprehensive surveillance program. It is not intended to diagnose infection nor to guide or monitor treatment. Performed at [facility], [address] [place], [place] [address]   Aerobic/Anaerobic Culture w Gram Stain (surgical/deep wound)     Status: None (Preliminary result)   Collection Time: 08/31/2020  3:12 PM   Specimen: PATH Disc; Tissue  Result Value Ref Range Status   Specimen Description WOUND  Final   Special Requests L3,L4 DISC ASPIRATION  Final   Gram Stain   Final    FEW WBC PRESENT,BOTH PMN AND MONONUCLEAR NO ORGANISMS SEEN Performed at [facility], [address], [place], [place] [address]    Culture   Final    RARE GRAM NEGATIVE RODS IDENTIFICATION AND SUSCEPTIBILITIES TO FOLLOW NO ANAEROBES ISOLATED; CULTURE IN PROGRESS FOR 5 DAYS    Report Status PENDING  Incomplete     Radiology Studies: MR CERVICAL SPINE WO CONTRAST  Result Date: 09/03/2020 CLINICAL DATA:  Patient is status post C3-5 ACDF 2 days ago on 09/03/2020. Weakness. Evaluate for epidural hematoma. EXAM: MRI CERVICAL SPINE WITHOUT CONTRAST TECHNIQUE: Multiplanar, multisequence MR imaging of the cervical spine was performed.  No intravenous contrast was administered. COMPARISON:  MRI 08/19/2020 FINDINGS: Technical note: Examination is significantly degraded by motion artifact, particularly affecting the axial sequences and sagittal STIR sequence. Alignment: Physiologic. Vertebrae: Interval postoperative changes of C3-C5 ACDF. T2/STIR hyperintense signal within the disc spaces and posterior aspects of the C3 and C4 vertebral bodies is favored to reflect expected postoperative appearance following recent ACDF. No fractures. No evidence of discitis. No suspicious bone lesions. Congenitally narrowed canal on the basis of short pedicles. Cord: Although degraded by motion. There is suggestion of T2 hyperintense signal within the cord at the C4-5 level (series 6, image 9). No evidence of epidural fluid collection on motion degraded images. Posterior Fossa, vertebral arteries, paraspinal tissues: Soft tissue edema with a small amount of fluid within the anterior soft tissues of the cervical spine extending from C2 to C6. No well-defined fluid collection or hematoma. Disc levels: Please note that evaluation of the cervical levels is limited secondary to significant motion degradation on axial sequences. C2-C3: No canal stenosis. Right worse than left bilateral foraminal stenosis. Unchanged. C3-C4: Interval ACDF. Severe canal stenosis persists at this level secondary to endplate spurring and posterior element hypertrophy. At least moderate bilateral foraminal stenosis. C4-C5: Interval ACDF. Interval improvement in the degree of canal stenosis, now moderate. At least moderate bilateral foraminal stenosis. C5-C6: Similar degree of mild-to-moderate canal stenosis with moderate to severe right and moderate left foraminal stenosis. C6-C7: Similar degree of mild-to-moderate canal stenosis with mild bilateral foraminal stenosis. C7-T1: Similar findings of moderate right foraminal stenosis without canal stenosis. IMPRESSION: 1. Motion degraded exam. 2.  Within this limitation, there is no evidence of epidural fluid collection/hematoma or hematoma within the soft tissues of the neck. 3. Interval postoperative changes of C3-C5 ACDF. There is persistent severe canal stenosis at the C3-4 level. 4. Interval improvement in the degree of canal stenosis at the C4-5 level, now moderate. 5. Similar degree of canal and foraminal stenosis at the remaining levels. Electronically Signed   By: Duanne Guess D.O.   On: 09/03/2020 14:37   DG Chest Port 1 View  Result Date: 09/04/2020 CLINICAL DATA:  70 year old male with possible L3-L4 discitis status post fluoroscopic disc aspiration, also cervical ACDF for spinal stenosis and myelopathy on 09/11/2020. Respiratory distress. EXAM: PORTABLE CHEST 1 VIEW COMPARISON:  Portable chest 07/07/2020 and earlier. FINDINGS: Portable AP semi upright view at 0913 hours. Larger lung volumes but confluent though indistinct bilateral  perihilar opacity is new since March. Mediastinal contours remain within normal limits although dense retrocardiac opacity is noted, possibly left lower lobe collapse. No pneumothorax. No definite pleural effusion. Stable cholecystectomy clips. No acute osseous abnormality identified. IMPRESSION: 1. Abnormal bilateral perihilar opacity suspicious for flash pulmonary edema. Aspiration or infection are felt less likely. 2. Increased retrocardiac density possibly due to left lower lobe collapse. Electronically Signed   By: Odessa Fleming M.D.   On: 09/04/2020 09:48    Scheduled Meds: . atorvastatin  20 mg Oral QHS  . Chlorhexidine Gluconate Cloth  6 each Topical Daily  . docusate sodium  100 mg Oral Daily  . finasteride  5 mg Oral Daily  . furosemide  40 mg Intravenous Q12H  . insulin aspart  0-9 Units Subcutaneous Q6H  . LORazepam  1 mg Intravenous Once  . magnesium oxide  400 mg Oral Daily  . senna  2 tablet Oral BID  . sodium chloride flush  3 mL Intravenous Q12H  . tamsulosin  0.4 mg Oral Daily     LOS: 7 days   Carma Leaven, DO [facility] 09/05/2020, 7:06 AM  If 7PM-7AM, please contact night-coverage [email or web address]

## 2020-09-05 NOTE — Progress Notes (Signed)
HOSPITAL MEDICINE OVERNIGHT EVENT NOTE    Patient exhibiting agitation throughout the evening, continually pulling at medical devices, pulling out his IV and pulling off his oxygen causing him to desaturate into the 60s.  Nursing is already attempted frequent redirection which she is not responding to as well as provision of mittens which does not seem to hinder the patient at all from continuing to pull at his medical devices.  I did attempt to order a sitter earlier in the evening but unfortunately there is no staff available.  Patient is not redirectable and a telemetry sitter unfortunately will not help.  Patient has used a soft restraint of the right wrist in the past, we will go ahead and reorder this.  Marinda Elk  MD [facility]

## 2020-09-05 NOTE — Consult Note (Signed)
Date of Admission:  09/07/2020          Reason for Consult: Lumbar vertebral osteomyelitis with discitis   Referring Provider: Dr. Natale MilchLancaster   Assessment:  1.  Lumbar vertebral osteomyelitis with discitis from at least seeded through hematogenous spread from a bacteremia and on March 2 2.  Severe cervical spine stenosis and myelopathy.  C3-4,C4/5 status post 3. Anterior cervical decompression C3-C5 arthrodesis C3 445 with 7 with allografts and anterior instrumentation from C3-C5  4. Postoperative epidural fluid collection 5. Intellectual disability 6. Nonverbal 7. Hypertension 8. Diabetes mellitus  Plan:  1. Continue ceftriaxone 2 g IV daily for now (though his healthcare power of attorney and caregiver has told me that he will invariably pull out lines if they are left in place) --we may eventually have to switch over to oral therapy such as high-dose amoxicillin or cephalexin 2. I would treat him for minimum of 8 weeks for his lumbar infection and in fact longer if there is concern that the fluid Selection seen on most recent MRI could be infected.  Certainly from the operative note it did not sound as if there was evidence of infection at the time of surgery.     Principal Problem:   Lower extremity weakness Active Problems:   Urine retention   Intellectual disability   HTN (hypertension)   Diabetes mellitus type 2, noninsulin dependent (HCC)   Nonverbal   Cervical spondylosis with myelopathy and radiculopathy   Scheduled Meds: . atorvastatin  20 mg Oral QHS  . Chlorhexidine Gluconate Cloth  6 each Topical Daily  . docusate sodium  100 mg Oral Daily  . finasteride  5 mg Oral Daily  . insulin aspart  0-9 Units Subcutaneous Q6H  . LORazepam  1 mg Intravenous Once  . magnesium oxide  400 mg Oral Daily  . senna  2 tablet Oral BID  . sodium chloride flush  3 mL Intravenous Q12H  . tamsulosin  0.4 mg Oral Daily   Continuous Infusions: . sodium chloride Stopped  (08/21/2020 2143)   PRN Meds:.acetaminophen **OR** acetaminophen, cyclobenzaprine, HYDROcodone-acetaminophen, menthol-cetylpyridinium **OR** phenol, ondansetron **OR** ondansetron (ZOFRAN) IV, sodium chloride flush  HPI: [name] is a 70 y.o. male with history of intellectual disability diabetes mellitus COVID-19 infection in March who presented from his nursing facility with bilateral lower extremity weakness and urinary retention.  MRI was performed which showed evidence in the L3-L4 disc space for edema concerning for discitis and osteomyelitis severe neural foraminal stenosis at L3 and L4 and L5-S1 seen on MRI from May 12.  MRI of the cervical spine performed on the 17th showed multilevel spondylosis superimposed on congenitally small small spinal canal with severe multifactorial spinal stenosis at C3-4 C4-5 with cord compression and abnormal cord signal consistent with myelomalacia with mild spinal stenosis from C5-6 and C6-7  Neurosurgery consulted and with regards to his lumbar spine findings recommended holding antibiotics for an IR guided aspirate.  This was accomplished on 19 May and has yielded E. Coli is resistant to ofloxacin and Bactrim but otherwise sensitive.  Recently had an E. coli bacteremia in March of second of 2022 with an organism with the identical sensitivity pattern of the 1 found in his lumbar spine.  At that time he had E. coli and Klebsiella in urine though neither 1 of these species matched the sensitivities of the organism found in his blood culture.  Unfortunately had worsening neurological status and taken to the operating room  and underwent  Anterior cervical compression of C3-C5 with arthrodesis of C3 445 and 7 with allograft's anterior instrumentation.  It does not appear that there was much to suggest infection based on the operative report by Dr. Franky Macho when he performs C-spine surgery.    Had worsening of his left upper extremity strength with more  apraxia and had a repeat MRI which showed evidence of epidural fluid collection, hematoma in the soft tissues of the neck with postoperative changes C3-5 ACDF, with severe gnosis at C3-C4 but improvement in stenosis at C4-C5  I will place the patient on ceftriaxone for now.  His aunt who is his caregiver tells me that he will take things out if they are left in such as IVs so it does not sound like him going to the skilled nursing facility with a PICC line will work well.  We will start by giving him ceftriaxone intravenously here and then can consider stepping down to a high-dose oral antibiotic as he nears discharge.  I spent greater than 80 minutes with the patient including greater than 50% of time in face to face counsel of the patient and his caregiver, regarding the nature of disc infections vertebral spine infections, personally reviewing his radiographs his microbiological data his laboratory data and in coordination of his care.   Review of Systems: Review of Systems  Unable to perform ROS: Patient nonverbal    Past Medical History:  Diagnosis Date  . Diabetes mellitus without complication (HCC)   . History of gout   . History of kidney stones   . Hypertension   . Mentally challenged   . Urinary retention     Social History   Tobacco Use  . Smoking status: Never Smoker  . Smokeless tobacco: Never Used  Vaping Use  . Vaping Use: Never used  Substance Use Topics  . Alcohol use: No  . Drug use: No    Family History  Problem Relation Age of Onset  . Gallbladder disease Other    No Known Allergies  OBJECTIVE: Blood pressure 108/60, pulse (!) 125, temperature 97.8 F (36.6 C), temperature source Axillary, resp. rate (!) 28, height 6' 0.01" (1.829 m), weight 53.9 kg, SpO2 95 %.  Physical Exam Constitutional:      Appearance: He is ill-appearing.  HENT:     Head: Normocephalic.  Cardiovascular:     Rate and Rhythm: Tachycardia present.  Pulmonary:     Effort:  Pulmonary effort is normal. No respiratory distress.     Breath sounds: No wheezing.  Abdominal:     General: Abdomen is flat.     Palpations: Abdomen is soft.  Neurological:     Mental Status: He is alert.    Patient is nonverbal, appears confused lying in somewhat fetal position on the right side while nurse attempts to place IV in the left arm  He is not moving the left arm much at this point in time although he is moving the right 1 spontaneously.  Lab Results Lab Results  Component Value Date   WBC 7.2 09/05/2020   HGB 10.9 (L) 09/05/2020   HCT 34.9 (L) 09/05/2020   MCV 86.0 09/05/2020   PLT 372 09/05/2020    Lab Results  Component Value Date   CREATININE 1.07 09/05/2020   BUN 23 09/05/2020   NA 138 09/05/2020   K 4.0 09/05/2020   CL 101 09/05/2020   CO2 24 09/05/2020    Lab Results  Component Value Date  ALT 13 08/19/2020   AST 16 08/30/2020   ALKPHOS 105 08/21/2020   BILITOT 0.5 08/25/2020     Microbiology: Recent Results (from the past 240 hour(s))  Culture, blood (routine x 2)     Status: None   Collection Time: 09/09/2020  4:03 PM   Specimen: BLOOD RIGHT ARM  Result Value Ref Range Status   Specimen Description   Final    BLOOD RIGHT ARM Performed at [facility], [address], [place], [place] [address]    Special Requests   Final    BOTTLES DRAWN AEROBIC AND ANAEROBIC Blood Culture adequate volume Performed at [facility], [address] [place], [place] [address]    Culture   Final    NO GROWTH 5 DAYS Performed at [facility], [address] [place], [place] [address]    Report Status 09/03/2020 FINAL  Final  Culture, blood (routine x 2)     Status: None   Collection Time: 09/09/2020  4:03 PM   Specimen: BLOOD RIGHT HAND  Result Value Ref Range Status   Specimen Description   Final    BLOOD RIGHT HAND Performed at [facility], [address] [place], [place] [address]    Special Requests   Final    BOTTLES DRAWN  AEROBIC AND ANAEROBIC Blood Culture adequate volume Performed at [facility], [address], [place], [place] [address]    Culture   Final    NO GROWTH 5 DAYS Performed at [facility], [address], [place], [place] [address]    Report Status 09/03/2020 FINAL  Final  Resp Panel by RT-PCR (Flu A&B, Covid) Nasopharyngeal Swab     Status: None   Collection Time: 08/20/2020  6:05 PM   Specimen: Nasopharyngeal Swab; Nasopharyngeal(NP) swabs in vial transport medium  Result Value Ref Range Status   SARS Coronavirus 2 by RT PCR NEGATIVE NEGATIVE Final    Comment: (NOTE) SARS-CoV-2 target nucleic acids are NOT DETECTED.  The SARS-CoV-2 RNA is generally detectable in upper respiratory specimens during the acute phase of infection. The lowest concentration of SARS-CoV-2 viral copies this assay can detect is 138 copies/mL. A negative result does not preclude SARS-Cov-2 infection and should not be used as the sole basis for treatment or other patient management decisions. A negative result may occur with  improper specimen collection/handling, submission of specimen other than nasopharyngeal swab, presence of viral mutation(s) within the areas targeted by this assay, and inadequate number of viral copies(<138 copies/mL). A negative result must be combined with clinical observations, patient history, and epidemiological information. The expected result is Negative.  Fact Sheet for Patients:  BloggerCourse.com  Fact Sheet for Healthcare Providers:  SeriousBroker.it  This test is no t yet approved or cleared by the [place] FDA and  has been authorized for detection and/or diagnosis of SARS-CoV-2 by FDA under an Emergency Use Authorization (EUA). This EUA will remain  in effect (meaning this test can be used) for the duration of the COVID-19 declaration under Section 564(b)(1) of the Act, 21 U.S.C.section 360bbb-3(b)(1), unless  the authorization is terminated  or revoked sooner.       Influenza A by PCR NEGATIVE NEGATIVE Final   Influenza B by PCR NEGATIVE NEGATIVE Final    Comment: (NOTE) The Xpert Xpress SARS-CoV-2/FLU/RSV plus assay is intended as an aid in the diagnosis of influenza from Nasopharyngeal swab specimens and should not be used as a sole basis for treatment. Nasal washings and aspirates  are unacceptable for Xpert Xpress SARS-CoV-2/FLU/RSV testing.  Fact Sheet for Patients: BloggerCourse.com  Fact Sheet for Healthcare Providers: SeriousBroker.it  This test is not yet approved or cleared by the [place] FDA and has been authorized for detection and/or diagnosis of SARS-CoV-2 by FDA under an Emergency Use Authorization (EUA). This EUA will remain in effect (meaning this test can be used) for the duration of the COVID-19 declaration under Section 564(b)(1) of the Act, 21 U.S.C. section 360bbb-3(b)(1), unless the authorization is terminated or revoked.  Performed at [facility], [address] [place], [place] [address]   Surgical PCR screen     Status: None   Collection Time: 08/31/20 11:36 AM   Specimen: Nasal Mucosa; Nasal Swab  Result Value Ref Range Status   MRSA, PCR NEGATIVE NEGATIVE Final   Staphylococcus aureus NEGATIVE NEGATIVE Final    Comment: (NOTE) The Xpert SA Assay (FDA approved for NASAL specimens in patients 21 years of age and older), is one component of a comprehensive surveillance program. It is not intended to diagnose infection nor to guide or monitor treatment. Performed at [facility], [address], [place], [place] [address]   Aerobic/Anaerobic Culture w Gram Stain (surgical/deep wound)     Status: None (Preliminary result)   Collection Time: 10/01/20  3:12 PM   Specimen: PATH Disc; Tissue  Result Value Ref Range Status   Specimen Description WOUND  Final   Special Requests L3,L4 DISC  ASPIRATION  Final   Gram Stain   Final    FEW WBC PRESENT,BOTH PMN AND MONONUCLEAR NO ORGANISMS SEEN Performed at [facility], [address], [place], [place] [address]    Culture   Final    RARE ESCHERICHIA COLI NO ANAEROBES ISOLATED; CULTURE IN PROGRESS FOR 5 DAYS    Report Status PENDING  Incomplete   Organism ID, Bacteria ESCHERICHIA COLI  Final      Susceptibility   Escherichia coli - MIC*    AMPICILLIN 8 SENSITIVE Sensitive     CEFAZOLIN <=4 SENSITIVE Sensitive     CEFEPIME <=0.12 SENSITIVE Sensitive     CEFTAZIDIME <=1 SENSITIVE Sensitive     CEFTRIAXONE <=0.25 SENSITIVE Sensitive     CIPROFLOXACIN >=4 RESISTANT Resistant     GENTAMICIN <=1 SENSITIVE Sensitive     IMIPENEM 0.5 SENSITIVE Sensitive     TRIMETH/SULFA >=320 RESISTANT Resistant     AMPICILLIN/SULBACTAM 4 SENSITIVE Sensitive     PIP/TAZO <=4 SENSITIVE Sensitive     * RARE ESCHERICHIA COLI    Acey Lav, MD [facility] [phone] pager  09/05/2020, 2:56 PM

## 2020-09-05 NOTE — Progress Notes (Signed)
Elink notified Bedside RN of Hypoxia, desats to 88% with O2 off, can hear congestion via camera, encouraged RN to replace O2 , call Provider and RT for assistance which she quickly responded to pt's needs replacing O2 and confirming the need to call provider. Made RN aware to call back for any assistance.

## 2020-09-05 NOTE — Progress Notes (Signed)
Patient ID: [name], male   DOB: Feb 23, 1951, 70 y.o.   MRN: [id number] BP 113/62 (BP Location: Left Arm)   Pulse (!) 128   Temp 97.8 F (36.6 C) (Axillary)   Resp 20   Ht 6' 0.01" (1.829 m)   Wt 53.9 kg   SpO2 94%   BMI 16.11 kg/m  The epidural fluid collection in the operative site as seen on the MRI cervical is not infected, nor is there any concern that this represents an infection. He was 2 days post op when the scan was done, it is impossible to not have an epidural fluid collection. There was no evidence of infection during the operation, nor was there any hint of infection on pre operative imaging.  Moving right side >than left Wound is clean,dry, no signs of infection Bruising around incision Continue with current measures

## 2020-09-06 DIAGNOSIS — M4712 Other spondylosis with myelopathy, cervical region: Secondary | ICD-10-CM | POA: Diagnosis not present

## 2020-09-06 DIAGNOSIS — R0603 Acute respiratory distress: Secondary | ICD-10-CM | POA: Diagnosis not present

## 2020-09-06 DIAGNOSIS — F79 Unspecified intellectual disabilities: Secondary | ICD-10-CM | POA: Diagnosis not present

## 2020-09-06 DIAGNOSIS — R29898 Other symptoms and signs involving the musculoskeletal system: Secondary | ICD-10-CM | POA: Diagnosis not present

## 2020-09-06 DIAGNOSIS — J9601 Acute respiratory failure with hypoxia: Secondary | ICD-10-CM | POA: Diagnosis present

## 2020-09-06 DIAGNOSIS — I10 Essential (primary) hypertension: Secondary | ICD-10-CM | POA: Diagnosis not present

## 2020-09-06 DIAGNOSIS — M4646 Discitis, unspecified, lumbar region: Secondary | ICD-10-CM | POA: Diagnosis present

## 2020-09-06 DIAGNOSIS — E119 Type 2 diabetes mellitus without complications: Secondary | ICD-10-CM | POA: Diagnosis not present

## 2020-09-06 LAB — BASIC METABOLIC PANEL
Anion gap: 14 (ref 5–15)
BUN: 29 mg/dL — ABNORMAL HIGH (ref 8–23)
CO2: 26 mmol/L (ref 22–32)
Calcium: 8.9 mg/dL (ref 8.9–10.3)
Chloride: 101 mmol/L (ref 98–111)
Creatinine, Ser: 1.02 mg/dL (ref 0.61–1.24)
GFR, Estimated: >60 mL/min (ref >60)
Glucose, Bld: 153 mg/dL — ABNORMAL HIGH (ref 70–99)
Potassium: 3.3 mmol/L — ABNORMAL LOW (ref 3.5–5.1)
Sodium: 141 mmol/L (ref 135–145)

## 2020-09-06 LAB — GLUCOSE, CAPILLARY
Glucose-Capillary: 130 mg/dL — ABNORMAL HIGH (ref 70–99)
Glucose-Capillary: 134 mg/dL — ABNORMAL HIGH (ref 70–99)
Glucose-Capillary: 146 mg/dL — ABNORMAL HIGH (ref 70–99)
Glucose-Capillary: 146 mg/dL — ABNORMAL HIGH (ref 70–99)
Glucose-Capillary: 170 mg/dL — ABNORMAL HIGH (ref 70–99)

## 2020-09-06 LAB — CBC
HCT: 34.4 % — ABNORMAL LOW (ref 39.0–52.0)
Hemoglobin: 10.9 g/dL — ABNORMAL LOW (ref 13.0–17.0)
MCH: 26.8 pg (ref 26.0–34.0)
MCHC: 31.7 g/dL (ref 30.0–36.0)
MCV: 84.5 fL (ref 80.0–100.0)
Platelets: 371 10*3/uL (ref 150–400)
RBC: 4.07 MIL/uL — ABNORMAL LOW (ref 4.22–5.81)
RDW: 17.4 % — ABNORMAL HIGH (ref 11.5–15.5)
WBC: 5.5 10*3/uL (ref 4.0–10.5)
nRBC: 0 % (ref 0.0–0.2)

## 2020-09-06 LAB — AEROBIC/ANAEROBIC CULTURE W GRAM STAIN (SURGICAL/DEEP WOUND)

## 2020-09-06 NOTE — Progress Notes (Signed)
  Speech Language Pathology Treatment: Dysphagia  Patient Details Name: [name] MRN: [id number] DOB: 12/22/1950 Today's Date: 09/06/2020 Time: 0914-0950 SLP Time Calculation (min) (ACUTE ONLY): 36 min  Assessment / Plan / Recommendation Clinical Impression  Followed up for PO readiness. Pt with poor management of saliva with dried secretions throughout oral cavity and palate region. Performed diligent oral care with great improvements in oral hygiene. Assessed with singe ice chips, small sips of water, nectar thick, honey thick, and puree. Delayed and intermittent immediate coughing exhibited, concerning for decreased airway protection; (Of note pt with sensed aspiration on MBSS yesterday, with cough, though not effective to clear aspirates). Recommend continue NPO with PO ice chips following diligent oral care and meds crushed in puree as tolerated. Consider short term alternative nutrition, per MD discretion. SLP to closely follow.     HPI HPI: Pt is a 70 y.o. male who was sent to the ED from SNF with reports of abnormal MRI. CXR 5/22: Abnormal bilateral perihilar opacity suspicious for flash pulmonary edema. Aspiration or infection felt less likely by radiologist. MRI 5/16: No acute abnormality identified. MR cervical spine: postoperative changes of C3-C5 ACDF 5/19. Persistent severe canal stenosis at the C3-4 level. Neurosurgery was consulted on admission who recommended IR consult for aspiration. PMH: intellectual disability with inability to communicate, diabetes mellitus, hypertension.      SLP Plan  Continue with current plan of care       Recommendations  Medication Administration: Crushed with puree (as tolerated)                Oral Care Recommendations: Oral care QID;Oral care prior to ice chip/H20 SLP Visit Diagnosis: Dysphagia, pharyngeal phase (R13.13) Plan: Continue with current plan of care       GO                Ardyth Gal MA, CCC-SLP Acute  Rehabilitation Services   09/06/2020, 9:59 AM

## 2020-09-06 NOTE — Progress Notes (Signed)
Patient ID: [name], male   DOB: [birth year], 70 y.o.   MRN: [id number] BP (!) 112/50   Pulse (!) 124   Temp 97.8 F (36.6 C) (Axillary)   Resp (!) 24   Ht 6' 0.01" (1.829 m)   Wt 53.9 kg   SpO2 93%   BMI 16.11 kg/m  Alert, follows commands Wound is clean,dry, no signs of infection Moving upper extremities, left less than right No new recommendations Will follow

## 2020-09-06 NOTE — Progress Notes (Signed)
Md at the bedside, speech therapy present. Okay to proceed with medications with speech therapy at the bedside assessing.

## 2020-09-06 NOTE — Progress Notes (Addendum)
PROGRESS NOTE    [name]  GEZ:[id number] DOB: 1950/12/17 DOA: 09-10-2020 PCP: Pearson Grippe, MD   Brief Narrative: [name] is a 70 y.o. male with a history of intellectual disability, diabetes mellitus.  Patient presented from a nursing home secondary bilateral lower extremity weakness, urinary retention and abnormal MRI.  Patient was found to have an L3-L4 disc space edema concerning for possible discitis/osteomyelitis versus degenerative edema.  Neurosurgery was consulted on admission who recommended IR consult for aspiration.  Blood cultures obtained on admission.  Antibiotics held secondary to stable patient.   Assessment & Plan:   Principal Problem:   Lower extremity weakness Active Problems:   Urine retention   Intellectual disability   HTN (hypertension)   Diabetes mellitus type 2, noninsulin dependent (HCC)   Nonverbal   Cervical spondylosis with myelopathy and radiculopathy   Sepsis secondary to E. coli infection of the lumbar spine, likely POA -Recent history of bacteremia and UTI, ID consulted for further evaluation and recommendations -E. coli bacteremia was not ESBL although previous UTI interestingly was. -Likely the etiology of acute metabolic encephalopathy -This infection was likely present prior to admission given timing of biopsy and sample, did not meet sepsis criteria until recently however  Acute metabolic encephalopathy, agitation, ongoing Acute hypoxic respiratory failure, improving Secondary to aspiration pneumonitis in the setting of above - High risk for aspiration/hypervolemia given mental status changes - On 5/21 - required ativan for repeat MRI due to anxiety/behavoir issues with likely aspiration event, concurrent sepsis with lumbar spine infection likely exacerbating his mental status changes -Mental status drastically improving over the past 48 hours, not yet back to baseline but following commands and interacting in a meaningful and  appropriate weight today which has not been the case over the weekend -Noncompliant with oxygen, sitter as needed and available -if no sitter available agreeable for right wrist restraint only given left arm weakness -If patient remains unable to tolerate p.o. safely in the next 24 to 48 hours would consider core track/NG tube for nutrition; holding off today given his remarkable improvement over the past 24 hours, attempt ice chips and sips today with meds to follow swallowing eval.  Severe spinal stenosis C3/4 and C4/5 with cord compression;  - Status post: Anterior Cervical decompression C3-5; Arthrodesis C3/4,4/5 with 7, and 36mm structural allografts; Anterior instrumentation(Globus resonate) C3-5" - Neurosurgery following, Dr. Franky Macho consulted on admission -Tolerated procedure quite well, awake alert this morning tolerating breakfast without difficulty -Left arm weakness -MRI shows severe C 3/4 stenosis - neurosurgery following  Lactic acidosis -Previously resolved in the setting of hypovolemia  Diabetes mellitus, type 2 Hemoglobin A1C of 7.1%. On metformin as an outpatient.  Continue sliding scale, hypoglycemic protocol  Hyperlipidemia -Continue Lipitor  BPH Urinary retention, chronic - Known history of BPH and chronic Foley use.   - Patient presented to the hospital with a Foley catheter already in place -last replaced at outside facility 08/16/2020. -Continue Flomax and finasteride  Intellectual disability Non-verbal at baseline Does appear to follow simple commands when he wants to; able to feed himself but otherwise very limited in day-to-day care  DVT prophylaxis: SCDs Code Status:   Code Status: Full Code Family Communication: At bedside Disposition Plan: Back to facility once acute symptoms have resolved as above and stable from a surgical standpoint  Consultants:   Neurosurgery  Interventional radiology  Infectious disease  Procedures:    None  Antimicrobials:  None   Subjective: No acute issues or  events overnight, patient's mental status and hypoxia appear to be resolving, attempting to work with speech today while in the room with varying degrees of success.  Review of systems remains markedly limited in the setting of patient's baseline mental status  Objective: Vitals:   09/05/20 1630 09/05/20 1946 09/05/20 2300 09/06/20 0342  BP: (!) 90/53 113/62 (!) 143/70 (!) 97/55  Pulse: (!) 33 (!) 128 (!) 121 (!) 118  Resp: (!) Temp: 97.8 F (36.6 C) 97.8 F (36.6 C) 98 F (36.7 C) 97.9 F (36.6 C)  TempSrc: Axillary Axillary Axillary Axillary  SpO2: 95% 94% 93% 93%  Weight:      Height:        Intake/Output Summary (Last 24 hours) at 09/06/2020 1610 Last data filed at 09/06/2020 0300 Gross per 24 hour  Intake 100.06 ml  Output 1650 ml  Net -1549.94 ml   Filed Weights   09/09/20 1451 08/30/20 0201  Weight: 72.6 kg 53.9 kg    Examination:  General exam: Somewhat agitated attempting to remove nasal cannula and mittens; no acute distress Respiratory system: Coarse breath sounds bilaterally most notably at the bases without overt rales wheeze or upper airway stridor Cardiovascular system: Tachycardic into the 120s, S1 & S2 heard, RRR. No murmurs, rubs, gallops or clicks. Gastrointestinal system: Abdomen is nondistended, soft and nontender. No organomegaly or masses felt. Normal bowel sounds heard. Central nervous system: Awake alert, unable to assess orientation, does not follow simple commands.  4/5 strength globally based on limited exam Musculoskeletal: No edema.  Skin: No cyanosis. No rashes -postsurgical wound clean dry intact anterior neck with expected postsurgical bruising  Data Reviewed: I have personally reviewed following labs and imaging studies  CBC Lab Results  Component Value Date   WBC 5.5 09/06/2020   RBC 4.07 (L) 09/06/2020   HGB 10.9 (L) 09/06/2020   HCT 34.4 (L)  09/06/2020   MCV 84.5 09/06/2020   MCH 26.8 09/06/2020   PLT 371 09/06/2020   MCHC 31.7 09/06/2020   RDW 17.4 (H) 09/06/2020   LYMPHSABS 0.7 Sep 09, 2020   MONOABS 0.5 09-09-20   EOSABS 0.0 09-09-2020   BASOSABS 0.0 2020/09/09     Last metabolic panel Lab Results  Component Value Date   NA 141 09/06/2020   K 3.3 (L) 09/06/2020   CL 101 09/06/2020   CO2 26 09/06/2020   BUN 29 (H) 09/06/2020   CREATININE 1.02 09/06/2020   GLUCOSE 153 (H) 09/06/2020   GFRNONAA >60 09/06/2020   GFRAA >60 07/07/2019   CALCIUM 8.9 09/06/2020   PHOS 2.6 03/03/2020   PROT 7.5 Sep 09, 2020   ALBUMIN 2.9 (L) 2020/09/09   BILITOT 0.5 09/09/20   ALKPHOS 105 09/09/20   AST 16 09/09/2020   ALT 13 09/09/2020   ANIONGAP 14 09/06/2020    CBG (last 3)  Recent Labs    09/05/20 1626 09/06/20 0014 09/06/20 0607  GLUCAP 143* 146* 134*     GFR: Estimated Creatinine Clearance: 51.4 mL/min (by C-G formula based on SCr of 1.02 mg/dL).  Coagulation Profile: Recent Labs  Lab 08/30/20 1031  INR 1.1    Recent Results (from the past 240 hour(s))  Culture, blood (routine x 2)     Status: None   Collection Time: 09-09-20  4:03 PM   Specimen: BLOOD RIGHT ARM  Result Value Ref Range Status   Specimen Description   Final    BLOOD RIGHT ARM Performed at [facility], [address]  [place], [place] [address]    Special Requests   Final    BOTTLES DRAWN AEROBIC AND ANAEROBIC Blood Culture adequate volume Performed at [facility], [address] [place], [place] [address]    Culture   Final    NO GROWTH 5 DAYS Performed at [facility], [address] [place], [place] [address]    Report Status 09/03/2020 FINAL  Final  Culture, blood (routine x 2)     Status: None   Collection Time: 09/12/2020  4:03 PM   Specimen: BLOOD RIGHT HAND  Result Value Ref Range Status   Specimen Description   Final    BLOOD RIGHT HAND Performed at [facility], [address]  [place], [place] [address]    Special Requests   Final    BOTTLES DRAWN AEROBIC AND ANAEROBIC Blood Culture adequate volume Performed at [facility], [address] [place], [place] [address]    Culture   Final    NO GROWTH 5 DAYS Performed at [facility], [address] [place], [place] [address]    Report Status 09/03/2020 FINAL  Final  Resp Panel by RT-PCR (Flu A&B, Covid) Nasopharyngeal Swab     Status: None   Collection Time: 08/19/2020  6:05 PM   Specimen: Nasopharyngeal Swab; Nasopharyngeal(NP) swabs in vial transport medium  Result Value Ref Range Status   SARS Coronavirus 2 by RT PCR NEGATIVE NEGATIVE Final    Comment: (NOTE) SARS-CoV-2 target nucleic acids are NOT DETECTED.  The SARS-CoV-2 RNA is generally detectable in upper respiratory specimens during the acute phase of infection. The lowest concentration of SARS-CoV-2 viral copies this assay can detect is 138 copies/mL. A negative result does not preclude SARS-Cov-2 infection and should not be used as the sole basis for treatment or other patient management decisions. A negative result may occur with  improper specimen collection/handling, submission of specimen other than nasopharyngeal swab, presence of viral mutation(s) within the areas targeted by this assay, and inadequate number of viral copies(<138 copies/mL). A negative result must be combined with clinical observations, patient history, and epidemiological information. The expected result is Negative.  Fact Sheet for Patients:  BloggerCourse.com  Fact Sheet for Healthcare Providers:  SeriousBroker.it  This test is no t yet approved or cleared by the [place] FDA and  has been authorized for detection and/or diagnosis of SARS-CoV-2 by FDA under an Emergency Use Authorization (EUA). This EUA will remain  in effect (meaning this test can be used) for the duration of the COVID-19 declaration under  Section 564(b)(1) of the Act, 21 U.S.C.section 360bbb-3(b)(1), unless the authorization is terminated  or revoked sooner.       Influenza A by PCR NEGATIVE NEGATIVE Final   Influenza B by PCR NEGATIVE NEGATIVE Final    Comment: (NOTE) The Xpert Xpress SARS-CoV-2/FLU/RSV plus assay is intended as an aid in the diagnosis of influenza from Nasopharyngeal swab specimens and should not be used as a sole basis for treatment. Nasal washings and aspirates are unacceptable for Xpert Xpress SARS-CoV-2/FLU/RSV testing.  Fact Sheet for Patients: BloggerCourse.com  Fact Sheet for Healthcare Providers: SeriousBroker.it  This test is not yet approved or cleared by the [place] FDA and has been authorized for detection and/or diagnosis of SARS-CoV-2 by FDA under an Emergency Use Authorization (EUA). This EUA will remain in effect (meaning this test can be used) for the duration of the COVID-19 declaration under Section 564(b)(1) of the Act, 21 U.S.C. section 360bbb-3(b)(1), unless the authorization  is terminated or revoked.  Performed at [facility], [address], [place], [place] [address]   Surgical PCR screen     Status: None   Collection Time: 08/31/20 11:36 AM   Specimen: Nasal Mucosa; Nasal Swab  Result Value Ref Range Status   MRSA, PCR NEGATIVE NEGATIVE Final   Staphylococcus aureus NEGATIVE NEGATIVE Final    Comment: (NOTE) The Xpert SA Assay (FDA approved for NASAL specimens in patients 9 years of age and older), is one component of a comprehensive surveillance program. It is not intended to diagnose infection nor to guide or monitor treatment. Performed at [facility], [address] [place], [place] [address]   Aerobic/Anaerobic Culture w Gram Stain (surgical/deep wound)     Status: None (Preliminary result)   Collection Time: 19-Sep-2020  3:12 PM   Specimen: PATH Disc; Tissue  Result Value Ref Range Status    Specimen Description WOUND  Final   Special Requests L3,L4 DISC ASPIRATION  Final   Gram Stain   Final    FEW WBC PRESENT,BOTH PMN AND MONONUCLEAR NO ORGANISMS SEEN Performed at [facility], [address], [place], [place] [address]    Culture   Final    RARE ESCHERICHIA COLI NO ANAEROBES ISOLATED; CULTURE IN PROGRESS FOR 5 DAYS    Report Status PENDING  Incomplete   Organism ID, Bacteria ESCHERICHIA COLI  Final      Susceptibility   Escherichia coli - MIC*    AMPICILLIN 8 SENSITIVE Sensitive     CEFAZOLIN <=4 SENSITIVE Sensitive     CEFEPIME <=0.12 SENSITIVE Sensitive     CEFTAZIDIME <=1 SENSITIVE Sensitive     CEFTRIAXONE <=0.25 SENSITIVE Sensitive     CIPROFLOXACIN >=4 RESISTANT Resistant     GENTAMICIN <=1 SENSITIVE Sensitive     IMIPENEM 0.5 SENSITIVE Sensitive     TRIMETH/SULFA >=320 RESISTANT Resistant     AMPICILLIN/SULBACTAM 4 SENSITIVE Sensitive     PIP/TAZO <=4 SENSITIVE Sensitive     * RARE ESCHERICHIA COLI     Radiology Studies: DG Chest Port 1 View  Result Date: 09/04/2020 CLINICAL DATA:  70 year old male with possible L3-L4 discitis status post fluoroscopic disc aspiration, also cervical ACDF for spinal stenosis and myelopathy on 09/19/20. Respiratory distress. EXAM: PORTABLE CHEST 1 VIEW COMPARISON:  Portable chest 07/07/2020 and earlier. FINDINGS: Portable AP semi upright view at 0913 hours. Larger lung volumes but confluent though indistinct bilateral perihilar opacity is new since March. Mediastinal contours remain within normal limits although dense retrocardiac opacity is noted, possibly left lower lobe collapse. No pneumothorax. No definite pleural effusion. Stable cholecystectomy clips. No acute osseous abnormality identified. IMPRESSION: 1. Abnormal bilateral perihilar opacity suspicious for flash pulmonary edema. Aspiration or infection are felt less likely. 2. Increased retrocardiac density possibly due to left lower lobe collapse. Electronically  Signed   By: Odessa Fleming M.D.   On: 09/04/2020 09:48   DG Swallowing Func-Speech Pathology  Result Date: 09/05/2020 Objective Swallowing Evaluation: Type of Study: MBS-Modified Barium Swallow Study  Patient Details Name: AYVION KAVANAGH MRN: [id number] Date of Birth: 1951-01-28 Today's Date: 09/05/2020 Time: SLP Start Time (ACUTE ONLY): 1234 -SLP Stop Time (ACUTE ONLY): 1256 SLP Time Calculation (min) (ACUTE ONLY): 21.27 min Past Medical History: Past Medical History: Diagnosis Date . Diabetes mellitus without complication (HCC)  . History of gout  . History of kidney stones  . Hypertension  . Mentally challenged  . Urinary retention  Past Surgical History: Past Surgical History:  Procedure Laterality Date . ANTERIOR CERVICAL DECOMP/DISCECTOMY FUSION N/A 08/30/2020  Procedure: ANTERIOR CERVICAL DISCECTOMY FUSION CERVICAL THREE-FOUR AND  ,CERVICAL  FOUR -FIVE;  Surgeon: Coletta Memos, MD;  Location: [facility] OR;  Service: Neurosurgery;  Laterality: N/A; . CHOLECYSTECTOMY N/A 05/25/2019  Procedure: OPEN CHOLECYSTECTOMY;  Surgeon: [name], MD;  Location: [facility]S;  Service: General;  Laterality: N/A; . CYSTOSCOPY W/ URETERAL STENT PLACEMENT Bilateral 06/20/2016  Procedure: CYSTOSCOPY WITH RETROGRADE PYELOGRAM/URETERAL STENT PLACEMENT;  Surgeon: Malen Gauze, MD;  Location: [facility];  Service: Urology;  Laterality: Bilateral; . CYSTOSCOPY WITH RETROGRADE PYELOGRAM, URETEROSCOPY AND STENT PLACEMENT Bilateral 08/20/2016  Procedure: CYSTOSCOPY WITH BILATERAL RETROGRADE PYELOGRAM, BILATERAL URETEROSCOPY,  LASER LITHOTRIPSY OF RIGHT URETERAL CALCULI, STONE BASKET EXTRACTION LEFT URETERAL CALCULI  AND BILATERAL URETERAL STENT EXCHANGE;  Surgeon: Malen Gauze, MD;  Location: [facility];  Service: Urology;  Laterality: Bilateral; . CYSTOSCOPY WITH RETROGRADE PYELOGRAM, URETEROSCOPY AND STENT PLACEMENT Left 01/14/2019  Procedure: CYSTOSCOPY WITH LEFT RETROGRADE PYELOGRAM, LEFT URETEROSCOPY AND LEFT URETERAL STENT PLACEMENT;   Surgeon: Malen Gauze, MD;  Location: [facility];  Service: Urology;  Laterality: Left; . CYSTOSCOPY WITH URETHRAL DILATATION  01/14/2019  Procedure: CYSTOSCOPY WITH URETHRAL DILATATION;  Surgeon: Malen Gauze, MD;  Location: [facility];  Service: Urology;; . HOLMIUM LASER APPLICATION Bilateral 08/20/2016  Procedure: HOLMIUM LASER APPLICATION;  Surgeon: Malen Gauze, MD;  Location: [facility];  Service: Urology;  Laterality: Bilateral; . HOLMIUM LASER APPLICATION Left 01/14/2019  Procedure: HOLMIUM LASER APPLICATION;  Surgeon: Malen Gauze, MD;  Location: [facility];  Service: Urology;  Laterality: Left; . IR CATHETER TUBE CHANGE  07/04/2018 . IR EXCHANGE BILIARY DRAIN  06/09/2018 . IR EXCHANGE BILIARY DRAIN  10/20/2018 . IR EXCHANGE BILIARY DRAIN  01/12/2019 . IR EXCHANGE BILIARY DRAIN  04/07/2019 . IR LUMBAR DISC ASPIRATION W/IMG GUIDE  09/07/2020 . IR PERC CHOLECYSTOSTOMY  05/24/2018 . LAPAROTOMY N/A 08/25/2018  Procedure: EXPLORATORY LAPAROTOMY reduction of volvulus;  Surgeon: [name], MD;  Location: [facility];  Service: General;  Laterality: N/A; . LAPAROTOMY N/A 05/25/2019  Procedure: EXPLORATORY LAPAROTOMY;  Surgeon: [name], MD;  Location: [facility];  Service: General;  Laterality: N/A; . RADIOLOGY WITH ANESTHESIA N/A 08/17/2020  Procedure: IR WITH ANESTHESIA;  Surgeon: Julieanne Cotton, MD;  Location: [facility] OR;  Service: Radiology;  Laterality: N/A; HPI: Pt is a 70 y.o. male who was sent to the ED from SNF with reports of abnormal MRI. CXR 5/22: Abnormal bilateral perihilar opacity suspicious for flash pulmonary edema. Aspiration or infection felt less likely by radiologist. MRI 5/16: No acute abnormality identified. MR cervical spine: postoperative changes of C3-C5 ACDF 5/19. Persistent severe canal stenosis at the C3-4 level. Neurosurgery was consulted on admission who recommended IR consult for aspiration. PMH: intellectual disability with inability to communicate, diabetes mellitus,  hypertension.  No data recorded Assessment / Plan / Recommendation CHL IP CLINICAL IMPRESSIONS 09/05/2020 Clinical Impression Pt presents with pharyngeal dysphagia characterized by reduced hyolaryngeal elavation, reduced anterior laryngeal movement and a pharyngeal delay. He demonstrated absent epiglottic inversion and inadequate airway closure which consistently resulted in aspiration (PAS 7). Vallecular residue and pyriform sinus residue were noted and did not clear despite pt's multiple attempts at achieving epiglottic inversion. Pt was unable to demonstrate compensatory strategies due to his difficult following commands, and additional boluses were deferred due to pt's performance. Aspiration consistently resulted in coughing, but this was ineffective in expelling aspirated material. Considering his presentation and recent ACDF, SLP questions the potential involvement of  superior laryngeal nerve. Pt's risk of aspiration is high at this time and an NPO status is therefore recommended with short-term enteral nutrition since SLP in unsure of his potential rate of recovery. Pt's case was discussed with referring MD, Dr. Natale Milch, who was in agreement with NPO status. Dr. Natale Milch expressed that the pt was eating three days prior to this and that he is typically able to follow commands. He further stated that the pt had some hypoxia and mental status changes from meds which should improve over the next few days since those meds have been discontinued. Dr. Natale Milch indicated that enteral nutrition will be considered if pt is still unable to safely tolerate p.o. intake by 5/25 or 5/26. SLP will follow for dysphagia treatment. SLP Visit Diagnosis Dysphagia, unspecified (R13.10) Attention and concentration deficit following -- Frontal lobe and executive function deficit following -- Impact on safety and function Severe aspiration risk;Risk for inadequate nutrition/hydration   CHL IP TREATMENT RECOMMENDATION 09/05/2020  Treatment Recommendations Therapy as outlined in treatment plan below   Prognosis 09/05/2020 Prognosis for Safe Diet Advancement Fair Barriers to Reach Goals Cognitive deficits;Severity of deficits Barriers/Prognosis Comment -- CHL IP DIET RECOMMENDATION 09/05/2020 SLP Diet Recommendations NPO;Alternative means - temporary Liquid Administration via -- Medication Administration Via alternative means Compensations -- Postural Changes --   CHL IP OTHER RECOMMENDATIONS 09/05/2020 Recommended Consults -- Oral Care Recommendations Oral care QID;Staff/trained caregiver to provide oral care Other Recommendations --   CHL IP FOLLOW UP RECOMMENDATIONS 09/05/2020 Follow up Recommendations (No Data)   CHL IP FREQUENCY AND DURATION 09/05/2020 Speech Therapy Frequency (ACUTE ONLY) min 2x/week Treatment Duration 2 weeks      CHL IP ORAL PHASE 09/05/2020 Oral Phase WFL Oral - Pudding Teaspoon -- Oral - Pudding Cup -- Oral - Honey Teaspoon -- Oral - Honey Cup -- Oral - Nectar Teaspoon -- Oral - Nectar Cup -- Oral - Nectar Straw -- Oral - Thin Teaspoon -- Oral - Thin Cup -- Oral - Thin Straw -- Oral - Puree -- Oral - Mech Soft -- Oral - Regular -- Oral - Multi-Consistency -- Oral - Pill -- Oral Phase - Comment --  CHL IP PHARYNGEAL PHASE 09/05/2020 Pharyngeal Phase Impaired Pharyngeal- Pudding Teaspoon -- Pharyngeal -- Pharyngeal- Pudding Cup -- Pharyngeal -- Pharyngeal- Honey Teaspoon -- Pharyngeal -- Pharyngeal- Honey Cup -- Pharyngeal -- Pharyngeal- Nectar Teaspoon -- Pharyngeal -- Pharyngeal- Nectar Cup -- Pharyngeal -- Pharyngeal- Nectar Straw -- Pharyngeal -- Pharyngeal- Thin Teaspoon Reduced epiglottic inversion;Reduced anterior laryngeal mobility;Reduced laryngeal elevation;Reduced airway/laryngeal closure;Pharyngeal residue - valleculae;Pharyngeal residue - pyriform;Penetration/Aspiration before swallow Pharyngeal Material enters airway, passes BELOW cords and not ejected out despite cough attempt by patient Pharyngeal- Thin Cup --  Pharyngeal -- Pharyngeal- Thin Straw -- Pharyngeal -- Pharyngeal- Puree Reduced epiglottic inversion;Reduced anterior laryngeal mobility;Reduced laryngeal elevation;Reduced airway/laryngeal closure;Pharyngeal residue - valleculae;Pharyngeal residue - pyriform;Penetration/Aspiration before swallow Pharyngeal Material enters airway, passes BELOW cords and not ejected out despite cough attempt by patient Pharyngeal- Mechanical Soft -- Pharyngeal -- Pharyngeal- Regular -- Pharyngeal -- Pharyngeal- Multi-consistency -- Pharyngeal -- Pharyngeal- Pill -- Pharyngeal -- Pharyngeal Comment --  Shanika I. Vear Clock, MS, CCC-SLP Acute Rehabilitation Services Office number [phone] Pager [phone] Scheryl Marten 09/05/2020, 2:29 PM               Scheduled Meds: . atorvastatin  20 mg Oral QHS  . Chlorhexidine Gluconate Cloth  6 each Topical Daily  . docusate sodium  100 mg Oral Daily  . finasteride  5 mg Oral Daily  .  insulin aspart  0-9 Units Subcutaneous Q6H  . LORazepam  1 mg Intravenous Once  . magnesium oxide  400 mg Oral Daily  . senna  2 tablet Oral BID  . sodium chloride flush  3 mL Intravenous Q12H  . tamsulosin  0.4 mg Oral Daily    LOS: 8 days   Carma LeavenWilliam Kelsa Jaworowski, DO [facility] 09/06/2020, 7:12 AM  If 7PM-7AM, please contact night-coverage [email or web address]

## 2020-09-06 NOTE — Progress Notes (Addendum)
Subjective:  nonverbal   Antibiotics:  Anti-infectives (From admission, onward)   Start     Dose/Rate Route Frequency Ordered Stop   09/05/20 1630  cefTRIAXone (ROCEPHIN) 2 g in sodium chloride 0.9 % 100 mL IVPB        2 g 200 mL/hr over 30 Minutes Intravenous Every 24 hours 09/05/20 1523        Medications: Scheduled Meds: . atorvastatin  20 mg Oral QHS  . Chlorhexidine Gluconate Cloth  6 each Topical Daily  . docusate sodium  100 mg Oral Daily  . finasteride  5 mg Oral Daily  . insulin aspart  0-9 Units Subcutaneous Q6H  . LORazepam  1 mg Intravenous Once  . magnesium oxide  400 mg Oral Daily  . senna  2 tablet Oral BID  . sodium chloride flush  3 mL Intravenous Q12H  . tamsulosin  0.4 mg Oral Daily   Continuous Infusions: . sodium chloride Stopped (08/27/2020 2143)  . cefTRIAXone (ROCEPHIN)  IV Stopped (09/05/20 1741)   PRN Meds:.acetaminophen **OR** acetaminophen, cyclobenzaprine, HYDROcodone-acetaminophen, menthol-cetylpyridinium **OR** phenol, ondansetron **OR** ondansetron (ZOFRAN) IV, sodium chloride flush    Objective: Weight change:   Intake/Output Summary (Last 24 hours) at 09/06/2020 1450 Last data filed at 09/06/2020 0300 Gross per 24 hour  Intake 100.06 ml  Output 1650 ml  Net -1549.94 ml   Blood pressure (!) 113/55, pulse (!) 123, temperature 97.6 F (36.4 C), temperature source Temporal, resp. rate (!) 23, height 6' 0.01" (1.829 m), weight 53.9 kg, SpO2 97 %. Temp:  [97.5 F (36.4 C)-98 F (36.7 C)] 97.6 F (36.4 C) (05/24 1201) Pulse Rate:  [33-129] 123 (05/24 1201) Resp:  [20-28] 23 (05/24 1201) BP: (90-143)/(53-70) 113/55 (05/24 1201) SpO2:  [92 %-97 %] 97 % (05/24 1201)  Physical Exam: Physical Exam Constitutional:      Appearance: He is well-developed.  HENT:     Head: Normocephalic and atraumatic.  Eyes:     Extraocular Movements: Extraocular movements intact.     Conjunctiva/sclera: Conjunctivae normal.  Cardiovascular:      Rate and Rhythm: Regular rhythm. Tachycardia present.     Heart sounds: No murmur heard. No friction rub. No gallop.   Pulmonary:     Effort: Pulmonary effort is normal. No respiratory distress.     Breath sounds: No wheezing.  Abdominal:     General: There is no distension.     Palpations: Abdomen is soft.  Musculoskeletal:        General: Normal range of motion.     Cervical back: Normal range of motion and neck supple.  Skin:    General: Skin is warm and dry.     Findings: No erythema or rash.  Neurological:     Mental Status: He is alert. He is disoriented.      CBC:    BMET Recent Labs    09/05/20 0809 09/06/20 0048  NA 138 141  K 4.0 3.3*  CL 101 101  CO2 24 26  GLUCOSE 146* 153*  BUN 23 29*  CREATININE 1.07 1.02  CALCIUM 8.8* 8.9     Liver Panel  No results for input(s): PROT, ALBUMIN, AST, ALT, ALKPHOS, BILITOT, BILIDIR, IBILI in the last 72 hours.     Sedimentation Rate No results for input(s): ESRSEDRATE in the last 72 hours. C-Reactive Protein No results for input(s): CRP in the last 72 hours.  Micro Results: Recent Results (from the past 720 hour(s))  Culture, blood (routine x 2)     Status: None   Collection Time: 09-19-20  4:03 PM   Specimen: BLOOD RIGHT ARM  Result Value Ref Range Status   Specimen Description   Final    BLOOD RIGHT ARM Performed at [facility], [address], [place], [place] [address]    Special Requests   Final    BOTTLES DRAWN AEROBIC AND ANAEROBIC Blood Culture adequate volume Performed at [facility], [address] [place], [place] [address]    Culture   Final    NO GROWTH 5 DAYS Performed at [facility], [address] [place], [place] [address]    Report Status 09/03/2020 FINAL  Final  Culture, blood (routine x 2)     Status: None   Collection Time: 09/19/20  4:03 PM   Specimen: BLOOD RIGHT HAND  Result Value Ref Range Status   Specimen Description   Final    BLOOD RIGHT  HAND Performed at [facility], [address] [place], [place] [address]    Special Requests   Final    BOTTLES DRAWN AEROBIC AND ANAEROBIC Blood Culture adequate volume Performed at [facility], [address], [place], [place] [address]    Culture   Final    NO GROWTH 5 DAYS Performed at [facility], [address] [place], [place] [address]    Report Status 09/03/2020 FINAL  Final  Resp Panel by RT-PCR (Flu A&B, Covid) Nasopharyngeal Swab     Status: None   Collection Time: 2020-09-19  6:05 PM   Specimen: Nasopharyngeal Swab; Nasopharyngeal(NP) swabs in vial transport medium  Result Value Ref Range Status   SARS Coronavirus 2 by RT PCR NEGATIVE NEGATIVE Final    Comment: (NOTE) SARS-CoV-2 target nucleic acids are NOT DETECTED.  The SARS-CoV-2 RNA is generally detectable in upper respiratory specimens during the acute phase of infection. The lowest concentration of SARS-CoV-2 viral copies this assay can detect is 138 copies/mL. A negative result does not preclude SARS-Cov-2 infection and should not be used as the sole basis for treatment or other patient management decisions. A negative result may occur with  improper specimen collection/handling, submission of specimen other than nasopharyngeal swab, presence of viral mutation(s) within the areas targeted by this assay, and inadequate number of viral copies(<138 copies/mL). A negative result must be combined with clinical observations, patient history, and epidemiological information. The expected result is Negative.  Fact Sheet for Patients:  BloggerCourse.com  Fact Sheet for Healthcare Providers:  SeriousBroker.it  This test is no t yet approved or cleared by the [place] FDA and  has been authorized for detection and/or diagnosis of SARS-CoV-2 by FDA under an Emergency Use Authorization (EUA). This EUA will remain  in effect (meaning this test can be  used) for the duration of the COVID-19 declaration under Section 564(b)(1) of the Act, 21 U.S.C.section 360bbb-3(b)(1), unless the authorization is terminated  or revoked sooner.       Influenza A by PCR NEGATIVE NEGATIVE Final   Influenza B by PCR NEGATIVE NEGATIVE Final    Comment: (NOTE) The Xpert Xpress SARS-CoV-2/FLU/RSV plus assay is intended as an aid in the diagnosis of influenza from Nasopharyngeal swab specimens and should not be used as a sole basis for treatment. Nasal washings and aspirates are unacceptable for Xpert Xpress SARS-CoV-2/FLU/RSV testing.  Fact Sheet for Patients: BloggerCourse.com  Fact Sheet for Healthcare Providers: SeriousBroker.it  This test is not yet approved or cleared by the  [place] [employer] trader and has been authorized for detection and/or diagnosis of SARS-CoV-2 by FDA under an TEFL teacher (EUA). This EUA will remain in effect (meaning this test can be used) for the duration of the COVID-19 declaration under Section 564(b)(1) of the Act, 21 U.S.C. section 360bbb-3(b)(1), unless the authorization is terminated or revoked.  Performed at [facility], [address], [place], [place] [address]   Surgical PCR screen     Status: None   Collection Time: 08/31/20 11:36 AM   Specimen: Nasal Mucosa; Nasal Swab  Result Value Ref Range Status   MRSA, PCR NEGATIVE NEGATIVE Final   Staphylococcus aureus NEGATIVE NEGATIVE Final    Comment: (NOTE) The Xpert SA Assay (FDA approved for NASAL specimens in patients 87 years of age and older), is one component of a comprehensive surveillance program. It is not intended to diagnose infection nor to guide or monitor treatment. Performed at [facility], [address], [place], [place] [address]   Aerobic/Anaerobic Culture w Gram Stain (surgical/deep wound)     Status: None (Preliminary result)   Collection Time: 09-15-2020  3:12 PM    Specimen: PATH Disc; Tissue  Result Value Ref Range Status   Specimen Description WOUND  Final   Special Requests L3,L4 DISC ASPIRATION  Final   Gram Stain   Final    FEW WBC PRESENT,BOTH PMN AND MONONUCLEAR NO ORGANISMS SEEN Performed at [facility], [address], [place], [place] [address]    Culture   Final    RARE ESCHERICHIA COLI NO ANAEROBES ISOLATED; CULTURE IN PROGRESS FOR 5 DAYS    Report Status PENDING  Incomplete   Organism ID, Bacteria ESCHERICHIA COLI  Final      Susceptibility   Escherichia coli - MIC*    AMPICILLIN 8 SENSITIVE Sensitive     CEFAZOLIN <=4 SENSITIVE Sensitive     CEFEPIME <=0.12 SENSITIVE Sensitive     CEFTAZIDIME <=1 SENSITIVE Sensitive     CEFTRIAXONE <=0.25 SENSITIVE Sensitive     CIPROFLOXACIN >=4 RESISTANT Resistant     GENTAMICIN <=1 SENSITIVE Sensitive     IMIPENEM 0.5 SENSITIVE Sensitive     TRIMETH/SULFA >=320 RESISTANT Resistant     AMPICILLIN/SULBACTAM 4 SENSITIVE Sensitive     PIP/TAZO <=4 SENSITIVE Sensitive     * RARE ESCHERICHIA COLI    Studies/Results: DG Swallowing Func-Speech Pathology  Result Date: 09/05/2020 Objective Swallowing Evaluation: Type of Study: MBS-Modified Barium Swallow Study  Patient Details Name: DOYT CASTELLANA MRN: [id number] Date of Birth: 01-07-1951 Today's Date: 09/05/2020 Time: SLP Start Time (ACUTE ONLY): 1234 -SLP Stop Time (ACUTE ONLY): 1256 SLP Time Calculation (min) (ACUTE ONLY): 21.27 min Past Medical History: Past Medical History: Diagnosis Date . Diabetes mellitus without complication (HCC)  . History of gout  . History of kidney stones  . Hypertension  . Mentally challenged  . Urinary retention  Past Surgical History: Past Surgical History: Procedure Laterality Date . ANTERIOR CERVICAL DECOMP/DISCECTOMY FUSION N/A 09-15-20  Procedure: ANTERIOR CERVICAL DISCECTOMY FUSION CERVICAL THREE-FOUR AND  ,CERVICAL  FOUR -FIVE;  Surgeon: Coletta Memos, MD;  Location: [facility] OR;  Service: Neurosurgery;   Laterality: N/A; . CHOLECYSTECTOMY N/A 05/25/2019  Procedure: OPEN CHOLECYSTECTOMY;  Surgeon: [name], MD;  Location: [facility] ORS;  Service: General;  Laterality: N/A; . CYSTOSCOPY W/ URETERAL STENT PLACEMENT Bilateral 06/20/2016  Procedure: CYSTOSCOPY WITH RETROGRADE PYELOGRAM/URETERAL STENT PLACEMENT;  Surgeon: Malen Gauze, MD;  Location: [facility];  Service: Urology;  Laterality: Bilateral; .  CYSTOSCOPY WITH RETROGRADE PYELOGRAM, URETEROSCOPY AND STENT PLACEMENT Bilateral 08/20/2016  Procedure: CYSTOSCOPY WITH BILATERAL RETROGRADE PYELOGRAM, BILATERAL URETEROSCOPY,  LASER LITHOTRIPSY OF RIGHT URETERAL CALCULI, STONE BASKET EXTRACTION LEFT URETERAL CALCULI  AND BILATERAL URETERAL STENT EXCHANGE;  Surgeon: Malen Gauze, MD;  Location: [facility];  Service: Urology;  Laterality: Bilateral; . CYSTOSCOPY WITH RETROGRADE PYELOGRAM, URETEROSCOPY AND STENT PLACEMENT Left 01/14/2019  Procedure: CYSTOSCOPY WITH LEFT RETROGRADE PYELOGRAM, LEFT URETEROSCOPY AND LEFT URETERAL STENT PLACEMENT;  Surgeon: Malen Gauze, MD;  Location: [facility];  Service: Urology;  Laterality: Left; . CYSTOSCOPY WITH URETHRAL DILATATION  01/14/2019  Procedure: CYSTOSCOPY WITH URETHRAL DILATATION;  Surgeon: Malen Gauze, MD;  Location: [facility];  Service: Urology;; . HOLMIUM LASER APPLICATION Bilateral 08/20/2016  Procedure: HOLMIUM LASER APPLICATION;  Surgeon: Malen Gauze, MD;  Location: [facility];  Service: Urology;  Laterality: Bilateral; . HOLMIUM LASER APPLICATION Left 01/14/2019  Procedure: HOLMIUM LASER APPLICATION;  Surgeon: Malen Gauze, MD;  Location: [facility];  Service: Urology;  Laterality: Left; . IR CATHETER TUBE CHANGE  07/04/2018 . IR EXCHANGE BILIARY DRAIN  06/09/2018 . IR EXCHANGE BILIARY DRAIN  10/20/2018 . IR EXCHANGE BILIARY DRAIN  01/12/2019 . IR EXCHANGE BILIARY DRAIN  04/07/2019 . IR LUMBAR DISC ASPIRATION W/IMG GUIDE  08/20/2020 . IR PERC CHOLECYSTOSTOMY  05/24/2018 . LAPAROTOMY N/A 08/25/2018  Procedure:  EXPLORATORY LAPAROTOMY reduction of volvulus;  Surgeon: [name], MD;  Location: [facility];  Service: General;  Laterality: N/A; . LAPAROTOMY N/A 05/25/2019  Procedure: EXPLORATORY LAPAROTOMY;  Surgeon: [name], MD;  Location: [facility];  Service: General;  Laterality: N/A; . RADIOLOGY WITH ANESTHESIA N/A 08/15/2020  Procedure: IR WITH ANESTHESIA;  Surgeon: Julieanne Cotton, MD;  Location: [facility] OR;  Service: Radiology;  Laterality: N/A; HPI: Pt is a 70 y.o. male who was sent to the ED from SNF with reports of abnormal MRI. CXR 5/22: Abnormal bilateral perihilar opacity suspicious for flash pulmonary edema. Aspiration or infection felt less likely by radiologist. MRI 5/16: No acute abnormality identified. MR cervical spine: postoperative changes of C3-C5 ACDF 5/19. Persistent severe canal stenosis at the C3-4 level. Neurosurgery was consulted on admission who recommended IR consult for aspiration. PMH: intellectual disability with inability to communicate, diabetes mellitus, hypertension.  No data recorded Assessment / Plan / Recommendation CHL IP CLINICAL IMPRESSIONS 09/05/2020 Clinical Impression Pt presents with pharyngeal dysphagia characterized by reduced hyolaryngeal elavation, reduced anterior laryngeal movement and a pharyngeal delay. He demonstrated absent epiglottic inversion and inadequate airway closure which consistently resulted in aspiration (PAS 7). Vallecular residue and pyriform sinus residue were noted and did not clear despite pt's multiple attempts at achieving epiglottic inversion. Pt was unable to demonstrate compensatory strategies due to his difficult following commands, and additional boluses were deferred due to pt's performance. Aspiration consistently resulted in coughing, but this was ineffective in expelling aspirated material. Considering his presentation and recent ACDF, SLP questions the potential involvement of superior laryngeal nerve. Pt's risk of aspiration is high at  this time and an NPO status is therefore recommended with short-term enteral nutrition since SLP in unsure of his potential rate of recovery. Pt's case was discussed with referring MD, Dr. Natale Milch, who was in agreement with NPO status. Dr. Natale Milch expressed that the pt was eating three days prior to this and that he is typically able to follow commands. He further stated that the pt had some hypoxia and mental status changes from meds which should improve over the next few days since those  meds have been discontinued. Dr. Natale Milch indicated that enteral nutrition will be considered if pt is still unable to safely tolerate p.o. intake by 5/25 or 5/26. SLP will follow for dysphagia treatment. SLP Visit Diagnosis Dysphagia, unspecified (R13.10) Attention and concentration deficit following -- Frontal lobe and executive function deficit following -- Impact on safety and function Severe aspiration risk;Risk for inadequate nutrition/hydration   CHL IP TREATMENT RECOMMENDATION 09/05/2020 Treatment Recommendations Therapy as outlined in treatment plan below   Prognosis 09/05/2020 Prognosis for Safe Diet Advancement Fair Barriers to Reach Goals Cognitive deficits;Severity of deficits Barriers/Prognosis Comment -- CHL IP DIET RECOMMENDATION 09/05/2020 SLP Diet Recommendations NPO;Alternative means - temporary Liquid Administration via -- Medication Administration Via alternative means Compensations -- Postural Changes --   CHL IP OTHER RECOMMENDATIONS 09/05/2020 Recommended Consults -- Oral Care Recommendations Oral care QID;Staff/trained caregiver to provide oral care Other Recommendations --   CHL IP FOLLOW UP RECOMMENDATIONS 09/05/2020 Follow up Recommendations (No Data)   CHL IP FREQUENCY AND DURATION 09/05/2020 Speech Therapy Frequency (ACUTE ONLY) min 2x/week Treatment Duration 2 weeks      CHL IP ORAL PHASE 09/05/2020 Oral Phase WFL Oral - Pudding Teaspoon -- Oral - Pudding Cup -- Oral - Honey Teaspoon -- Oral - Honey  Cup -- Oral - Nectar Teaspoon -- Oral - Nectar Cup -- Oral - Nectar Straw -- Oral - Thin Teaspoon -- Oral - Thin Cup -- Oral - Thin Straw -- Oral - Puree -- Oral - Mech Soft -- Oral - Regular -- Oral - Multi-Consistency -- Oral - Pill -- Oral Phase - Comment --  CHL IP PHARYNGEAL PHASE 09/05/2020 Pharyngeal Phase Impaired Pharyngeal- Pudding Teaspoon -- Pharyngeal -- Pharyngeal- Pudding Cup -- Pharyngeal -- Pharyngeal- Honey Teaspoon -- Pharyngeal -- Pharyngeal- Honey Cup -- Pharyngeal -- Pharyngeal- Nectar Teaspoon -- Pharyngeal -- Pharyngeal- Nectar Cup -- Pharyngeal -- Pharyngeal- Nectar Straw -- Pharyngeal -- Pharyngeal- Thin Teaspoon Reduced epiglottic inversion;Reduced anterior laryngeal mobility;Reduced laryngeal elevation;Reduced airway/laryngeal closure;Pharyngeal residue - valleculae;Pharyngeal residue - pyriform;Penetration/Aspiration before swallow Pharyngeal Material enters airway, passes BELOW cords and not ejected out despite cough attempt by patient Pharyngeal- Thin Cup -- Pharyngeal -- Pharyngeal- Thin Straw -- Pharyngeal -- Pharyngeal- Puree Reduced epiglottic inversion;Reduced anterior laryngeal mobility;Reduced laryngeal elevation;Reduced airway/laryngeal closure;Pharyngeal residue - valleculae;Pharyngeal residue - pyriform;Penetration/Aspiration before swallow Pharyngeal Material enters airway, passes BELOW cords and not ejected out despite cough attempt by patient Pharyngeal- Mechanical Soft -- Pharyngeal -- Pharyngeal- Regular -- Pharyngeal -- Pharyngeal- Multi-consistency -- Pharyngeal -- Pharyngeal- Pill -- Pharyngeal -- Pharyngeal Comment --  Shanika I. Vear Clock, MS, CCC-SLP Acute Rehabilitation Services Office number [phone] Pager [phone] Scheryl Marten 09/05/2020, 2:29 PM                 Assessment/Plan:  INTERVAL HISTORY:   Patient changed to ceftriaxone   Principal Problem:   Lower extremity weakness Active Problems:   Urine retention   Intellectual  disability   HTN (hypertension)   Diabetes mellitus type 2, noninsulin dependent (HCC)   Nonverbal   Cervical spondylosis with myelopathy and radiculopathy    1. ALCIDE MEMOLI is a 70 y.o. male with  Lumbar vertebral osteomyelitis with discitis from at least seeded through hematogenous spread from a bacteremia   Dr Franky Macho found no evidence of infection in C spine when he operated  Would try to give him IV antibiotics in the form of ceftriaxone while he is here and while he is not pulling out the IV.  My understanding is he cannot swallow  pills either so if we go down the route of administering his antibiotics through a feeding tube will still run to the problem that he may remove that as well.  I would plan on 6 week course of antibiotics  I spent greater than 35 minutes with the patient including greater than 50% of time in face to face counsel of the patient. Reviewing radiographs, lab and microbiological data and in coordination of his care.   AURELIUS GILDERSLEEVE has an appointment on 09/28/2020 at [facility] with Marcos Eke  He should be brought to our clinic 15-30 minutes prior to his appointment.  [facility] is located in the [facility] at  [address] [place] in [place].  [address], which is located to the left of the elevators.  Phone: [phone]  Fax: [phone]  [email or web address]   I will step into background and sign off for now  Please contact me or one of my partners if we need to re-engage as far as decisions re IV and po antibiotics prior to DC.     LOS: 8 days   Acey Lav 09/06/2020, 2:50 PM

## 2020-09-07 ENCOUNTER — Inpatient Hospital Stay (HOSPITAL_COMMUNITY): Payer: Medicare Other

## 2020-09-07 DIAGNOSIS — R29898 Other symptoms and signs involving the musculoskeletal system: Secondary | ICD-10-CM | POA: Diagnosis not present

## 2020-09-07 DIAGNOSIS — R0603 Acute respiratory distress: Secondary | ICD-10-CM | POA: Diagnosis not present

## 2020-09-07 DIAGNOSIS — M4712 Other spondylosis with myelopathy, cervical region: Secondary | ICD-10-CM | POA: Diagnosis not present

## 2020-09-07 DIAGNOSIS — E119 Type 2 diabetes mellitus without complications: Secondary | ICD-10-CM | POA: Diagnosis not present

## 2020-09-07 LAB — GLUCOSE, CAPILLARY
Glucose-Capillary: 176 mg/dL — ABNORMAL HIGH (ref 70–99)
Glucose-Capillary: 179 mg/dL — ABNORMAL HIGH (ref 70–99)
Glucose-Capillary: 224 mg/dL — ABNORMAL HIGH (ref 70–99)
Glucose-Capillary: 239 mg/dL — ABNORMAL HIGH (ref 70–99)

## 2020-09-07 LAB — CBC
HCT: 34.3 % — ABNORMAL LOW (ref 39.0–52.0)
Hemoglobin: 10.6 g/dL — ABNORMAL LOW (ref 13.0–17.0)
MCH: 26.8 pg (ref 26.0–34.0)
MCHC: 30.9 g/dL (ref 30.0–36.0)
MCV: 86.6 fL (ref 80.0–100.0)
Platelets: 313 10*3/uL (ref 150–400)
RBC: 3.96 MIL/uL — ABNORMAL LOW (ref 4.22–5.81)
RDW: 17.4 % — ABNORMAL HIGH (ref 11.5–15.5)
WBC: 6.6 10*3/uL (ref 4.0–10.5)
nRBC: 0 % (ref 0.0–0.2)

## 2020-09-07 LAB — BASIC METABOLIC PANEL
Anion gap: 15 (ref 5–15)
BUN: 36 mg/dL — ABNORMAL HIGH (ref 8–23)
CO2: 27 mmol/L (ref 22–32)
Calcium: 9.1 mg/dL (ref 8.9–10.3)
Chloride: 103 mmol/L (ref 98–111)
Creatinine, Ser: 1.09 mg/dL (ref 0.61–1.24)
GFR, Estimated: >60 mL/min (ref >60)
Glucose, Bld: 175 mg/dL — ABNORMAL HIGH (ref 70–99)
Potassium: 3.5 mmol/L (ref 3.5–5.1)
Sodium: 145 mmol/L (ref 135–145)

## 2020-09-07 MED ORDER — BISACODYL 10 MG RE SUPP: 10.0000 mg | Freq: Every day | RECTAL | Status: DC | PRN

## 2020-09-07 MED ORDER — LACTATED RINGERS IV SOLN: INTRAVENOUS | Status: DC

## 2020-09-07 NOTE — Progress Notes (Signed)
PROGRESS NOTE  [name]  WEX:[id number] DOB: 03-24-1951 DOA: 09/13/2020 PCP: Pearson Grippe, MD   Brief Narrative: [name] is a 70 y.o. male with a history of intellectual disability, diabetes mellitus.  Patient presented from a nursing home secondary bilateral lower extremity weakness, urinary retention and abnormal MRI.  Patient was found to have an L3-L4 disc space edema concerning for possible discitis/osteomyelitis versus degenerative edema.  Neurosurgery was consulted on admission who recommended IR consult for aspiration.  Blood cultures obtained on admission.  Antibiotics held secondary to stable patient.  Assessment & Plan: Principal Problem:   Lower extremity weakness Active Problems:   Urine retention   Intellectual disability   HTN (hypertension)   Diabetes mellitus type 2, noninsulin dependent (HCC)   Nonverbal   Cervical spondylosis with myelopathy and radiculopathy   Acute respiratory distress   Lumbar discitis  Sepsis secondary to E. coli infection of the lumbar spine, likely POA -Recent history of bacteremia and UTI, ID consulted for further evaluation and recommendations -E. coli bacteremia was not ESBL although previous UTI interestingly was. -Likely the etiology of acute metabolic encephalopathy -This infection was likely present prior to admission given timing of biopsy and sample, did not meet sepsis criteria until recently however  Acute metabolic encephalopathy, agitation, ongoing Acute hypoxic respiratory failure, improving Secondary to aspiration pneumonitis in the setting of above Aspiration event, recurrent 5/25:  - CXR - Continue abx as already receiving - NPO, minimize pill burden - Palliative consulted to assist family with decision regarding PEG placement.  - Start IV fluids given inability to take adequate po and worsening sinus tachycardia, fortunately no hypotension.  - On 5/21 - required ativan for repeat MRI due to anxiety/behavoir  issues with likely aspiration event, concurrent sepsis with lumbar spine infection likely exacerbating his mental status changes - Monitor mental status -Noncompliant with oxygen, sitter as needed and available -if no sitter available agreeable for right wrist restraint only given left arm weakness  Severe spinal stenosis C3/4 and C4/5 with cord compression;  - Status post: Anterior Cervical decompression C3-5; Arthrodesis C3/4,4/5with 7, and 56mm structural allografts; Anterior instrumentation(Globus resonate) C3-5" - Neurosurgery following, Dr. Franky Macho consulted on admission -Tolerated procedure quite well, awake alert this morning tolerating breakfast without difficulty   Lactic acidosis -Previously resolved in the setting of hypovolemia  Diabetes mellitus, type 2 Hemoglobin A1C of 7.1%. On metformin as an outpatient.  Continue sliding scale, hypoglycemic protocol  Hyperlipidemia -Continue Lipitor  Intellectual disability Non-verbal at baseline Does appear to follow simple commands when he wants to; able to feed himself but otherwise very limited in day-to-day care  Constipation: LBM 5/19. MAR lists senna, colace.  - Abd is benign. Check Abd XR portable - Suppository, if needed will try enema.  - In effort to minimize pill burden, DC stool softeners.  Urinary retention, BPH:  - Continue home flomax, finasteride - Continue foley, last exchanged 5/3 at outside facility.   RN Pressure Injury Documentation: Pressure Injury 09/05/20 Thigh Left;Posterior;Proximal Deep Tissue Pressure Injury - Purple or maroon localized area of discolored intact skin or blood-filled blister due to damage of underlying soft tissue from pressure and/or shear. (Active)  09/05/20 1509  Location: Thigh  Location Orientation: Left;Posterior;Proximal  Staging: Deep Tissue Pressure Injury - Purple or maroon localized area of discolored intact skin or blood-filled blister due to damage of underlying soft  tissue from pressure and/or shear.  Wound Description (Comments):   Present on Admission: No    DVT  prophylaxis: SCDs Code Status: Full Family Communication: None at bedside Disposition Plan:  Status is: Inpatient  Remains inpatient appropriate because:Inpatient level of care appropriate due to severity of illness  Dispo: The patient is from: Facility              Anticipated d/c is to: Facility              Patient currently is not medically stable to d/c.   Difficult to place patient No  Consultants:   ID  IR  Neurosurgery  Procedures:   None  Antimicrobials:  Ceftriaxone   Subjective: Nonverbal, tracks and interacts. Had aspiration event with PT today reported by RN. Sinus tachycardia without hypotension and stable hypoxia since that time.  Objective: Vitals:   09/06/20 2039 09/06/20 2343 09/07/20 0351 09/07/20 1200  BP: (!) 119/56 (!) 105/56 (!) 106/52 (!) 118/48  Pulse: (!) 120 (!) 118 (!) 113 (!) 115  Resp: Temp: 97.9 F (36.6 C) 97.6 F (36.4 C) 97.6 F (36.4 C)   TempSrc: Axillary Axillary Axillary   SpO2: 94% 95% 96% 96%  Weight:      Height:        Intake/Output Summary (Last 24 hours) at 09/07/2020 1352 Last data filed at 09/07/2020 0500 Gross per 24 hour  Intake 100 ml  Output 600 ml  Net -500 ml   Filed Weights   08/15/2020 1451 08/30/20 0201  Weight: 72.6 kg 53.9 kg    Gen: Thin chronically ill-appearing male in no distress Pulm: Non-labored breathing supplemental oxygen. Clear to auscultation bilaterally.  CV: Regular rate and rhythm. No murmur, rub, or gallop. No JVD, no pitting pedal edema. GI: Abdomen soft, non-tender, non-distended, with normoactive bowel sounds. No organomegaly or masses felt. Ext: Warm, no deformities Skin: No rashes, lesions or ulcers on visualized skin Neuro: Alert, moves RUE > LUE. Psych: Calm  Data Reviewed: I have personally reviewed following labs and imaging studies  CBC: Recent Labs  Lab  09/03/20 0416 09/04/20 0229 09/05/20 0809 09/06/20 0048 09/07/20 0257  WBC 5.0 5.6 7.2 5.5 6.6  HGB 9.4* 11.4* 10.9* 10.9* 10.6*  HCT 30.1* 35.3* 34.9* 34.4* 34.3*  MCV 86.7 85.1 86.0 84.5 86.6  PLT 263 314 372 371 313   Basic Metabolic Panel: Recent Labs  Lab 09/03/20 0416 09/04/20 0229 09/05/20 0809 09/06/20 0048 09/07/20 0257  NA 135 135 138 141 145  K 3.8 3.7 4.0 3.3* 3.5  CL 102 99 101 101 103  CO2 GLUCOSE 128* 103* 146* 153* 175*  BUN <5* <5* 23 29* 36*  CREATININE 0.56* 0.58* 1.07 1.02 1.09  CALCIUM 8.4* 8.7* 8.8* 8.9 9.1   GFR: Estimated Creatinine Clearance: 48.1 mL/min (by C-G formula based on SCr of 1.09 mg/dL). Liver Function Tests: No results for input(s): AST, ALT, ALKPHOS, BILITOT, PROT, ALBUMIN in the last 168 hours. No results for input(s): LIPASE, AMYLASE in the last 168 hours. No results for input(s): AMMONIA in the last 168 hours. Coagulation Profile: No results for input(s): INR, PROTIME in the last 168 hours. Cardiac Enzymes: No results for input(s): CKTOTAL, CKMB, CKMBINDEX, TROPONINI in the last 168 hours. BNP (last 3 results) No results for input(s): PROBNP in the last 8760 hours. HbA1C: No results for input(s): HGBA1C in the last 72 hours. CBG: Recent Labs  Lab 09/06/20 1159 09/06/20 1755 09/06/20 2342 09/07/20 0519 09/07/20 1220  GLUCAP 170* 130* 146* 176* 239*   Lipid Profile:  No results for input(s): CHOL, HDL, LDLCALC, TRIG, CHOLHDL, LDLDIRECT in the last 72 hours. Thyroid Function Tests: No results for input(s): TSH, T4TOTAL, FREET4, T3FREE, THYROIDAB in the last 72 hours. Anemia Panel: No results for input(s): VITAMINB12, FOLATE, FERRITIN, TIBC, IRON, RETICCTPCT in the last 72 hours. Urine analysis:    Component Value Date/Time   COLORURINE YELLOW 07/30/2020 1614   APPEARANCEUR CLOUDY (A) 07/30/2020 1614   APPEARANCEUR Cloudy (A) 03/17/2020 1621   LABSPEC 1.015 07/30/2020 1614   PHURINE 6.0 07/30/2020  1614   GLUCOSEU 50 (A) 07/30/2020 1614   HGBUR MODERATE (A) 07/30/2020 1614   BILIRUBINUR NEGATIVE 07/30/2020 1614   BILIRUBINUR Negative 03/17/2020 1621   KETONESUR NEGATIVE 07/30/2020 1614   PROTEINUR >=300 (A) 07/30/2020 1614   UROBILINOGEN 1.0 07/20/2014 1718   NITRITE NEGATIVE 07/30/2020 1614   LEUKOCYTESUR LARGE (A) 07/30/2020 1614   Recent Results (from the past 240 hour(s))  Culture, blood (routine x 2)     Status: None   Collection Time: 2020/09/04  4:03 PM   Specimen: BLOOD RIGHT ARM  Result Value Ref Range Status   Specimen Description   Final    BLOOD RIGHT ARM Performed at [facility], [address], [place], [place] [address]    Special Requests   Final    BOTTLES DRAWN AEROBIC AND ANAEROBIC Blood Culture adequate volume Performed at [facility], [address], [place], [place] [address]    Culture   Final    NO GROWTH 5 DAYS Performed at [facility], [address], [place], [place] [address]    Report Status 09/03/2020 FINAL  Final  Culture, blood (routine x 2)     Status: None   Collection Time: 2020-09-04  4:03 PM   Specimen: BLOOD RIGHT HAND  Result Value Ref Range Status   Specimen Description   Final    BLOOD RIGHT HAND Performed at [facility], [address], [place], [place] [address]    Special Requests   Final    BOTTLES DRAWN AEROBIC AND ANAEROBIC Blood Culture adequate volume Performed at [facility], [address] [place], [place] [address]    Culture   Final    NO GROWTH 5 DAYS Performed at [name], [address], [place], [place] [address]    Report Status 09/03/2020 FINAL  Final  Resp Panel by RT-PCR (Flu A&B, Covid) Nasopharyngeal Swab     Status: None   Collection Time: 09/04/2020  6:05 PM   Specimen: Nasopharyngeal Swab; Nasopharyngeal(NP) swabs in vial transport medium  Result Value Ref Range Status   SARS Coronavirus 2 by RT PCR NEGATIVE NEGATIVE Final    Comment: (NOTE) SARS-CoV-2 target  nucleic acids are NOT DETECTED.  The SARS-CoV-2 RNA is generally detectable in upper respiratory specimens during the acute phase of infection. The lowest concentration of SARS-CoV-2 viral copies this assay can detect is 138 copies/mL. A negative result does not preclude SARS-Cov-2 infection and should not be used as the sole basis for treatment or other patient management decisions. A negative result may occur with  improper specimen collection/handling, submission of specimen other than nasopharyngeal swab, presence of viral mutation(s) within the areas targeted by this assay, and inadequate number of viral copies(<138 copies/mL). A negative result must be combined with clinical observations, patient history, and epidemiological information. The expected result is Negative.  Fact Sheet for Patients:  BloggerCourse.com  Fact Sheet for Healthcare Providers:  SeriousBroker.it  This test is no t yet approved  or cleared by the [place] and  has been authorized for detection and/or diagnosis of SARS-CoV-2 by FDA under an Emergency Use Authorization (EUA). This EUA will remain  in effect (meaning this test can be used) for the duration of the COVID-19 declaration under Section 564(b)(1) of the Act, 21 U.S.C.section 360bbb-3(b)(1), unless the authorization is terminated  or revoked sooner.       Influenza A by PCR NEGATIVE NEGATIVE Final   Influenza B by PCR NEGATIVE NEGATIVE Final    Comment: (NOTE) The Xpert Xpress SARS-CoV-2/FLU/RSV plus assay is intended as an aid in the diagnosis of influenza from Nasopharyngeal swab specimens and should not be used as a sole basis for treatment. Nasal washings and aspirates are unacceptable for Xpert Xpress SARS-CoV-2/FLU/RSV testing.  Fact Sheet for Patients: BloggerCourse.com  Fact Sheet for Healthcare  Providers: SeriousBroker.it  This test is not yet approved or cleared by the [place] FDA and has been authorized for detection and/or diagnosis of SARS-CoV-2 by FDA under an Emergency Use Authorization (EUA). This EUA will remain in effect (meaning this test can be used) for the duration of the COVID-19 declaration under Section 564(b)(1) of the Act, 21 U.S.C. section 360bbb-3(b)(1), unless the authorization is terminated or revoked.  Performed at [facility], [address], [place], [place] [address]   Surgical PCR screen     Status: None   Collection Time: 08/31/20 11:36 AM   Specimen: Nasal Mucosa; Nasal Swab  Result Value Ref Range Status   MRSA, PCR NEGATIVE NEGATIVE Final   Staphylococcus aureus NEGATIVE NEGATIVE Final    Comment: (NOTE) The Xpert SA Assay (FDA approved for NASAL specimens in patients 22 years of age and older), is one component of a comprehensive surveillance program. It is not intended to diagnose infection nor to guide or monitor treatment. Performed at [facility], [address], [place], [place] [address]   Aerobic/Anaerobic Culture w Gram Stain (surgical/deep wound)     Status: None   Collection Time: 09/09/2020  3:12 PM   Specimen: PATH Disc; Tissue  Result Value Ref Range Status   Specimen Description WOUND  Final   Special Requests L3,L4 DISC ASPIRATION  Final   Gram Stain   Final    FEW WBC PRESENT,BOTH PMN AND MONONUCLEAR NO ORGANISMS SEEN    Culture   Final    RARE ESCHERICHIA COLI NO ANAEROBES ISOLATED Performed at [facility], [address], [place], [place] [address]    Report Status 09/06/2020 FINAL  Final   Organism ID, Bacteria ESCHERICHIA COLI  Final      Susceptibility   Escherichia coli - MIC*    AMPICILLIN 8 SENSITIVE Sensitive     CEFAZOLIN <=4 SENSITIVE Sensitive     CEFEPIME <=0.12 SENSITIVE Sensitive     CEFTAZIDIME <=1 SENSITIVE Sensitive     CEFTRIAXONE <=0.25 SENSITIVE  Sensitive     CIPROFLOXACIN >=4 RESISTANT Resistant     GENTAMICIN <=1 SENSITIVE Sensitive     IMIPENEM 0.5 SENSITIVE Sensitive     TRIMETH/SULFA >=320 RESISTANT Resistant     AMPICILLIN/SULBACTAM 4 SENSITIVE Sensitive     PIP/TAZO <=4 SENSITIVE Sensitive     * RARE ESCHERICHIA COLI      Radiology Studies: No results found.  Scheduled Meds: . Chlorhexidine Gluconate Cloth  6 each Topical Daily  . finasteride  5 mg Oral Daily  . insulin aspart  0-9 Units Subcutaneous Q6H  . LORazepam  1 mg Intravenous Once  .  magnesium oxide  400 mg Oral Daily  . sodium chloride flush  3 mL Intravenous Q12H  . tamsulosin  0.4 mg Oral Daily   Continuous Infusions: . sodium chloride Stopped (09/03/2020 2143)  . cefTRIAXone (ROCEPHIN)  IV Stopped (09/06/20 1607)  . lactated ringers       LOS: 9 days   Time spent: 35 minutes.  Tyrone Nineyan B Ibrohim Simmers, MD [facility] [email or web address] 09/07/2020, 1:52 PM

## 2020-09-07 NOTE — Progress Notes (Signed)
Physical Therapy Treatment Patient Details Name: [name] MRN: [id number] DOB: 1950/10/08 Today's Date: 09/07/2020    History of Present Illness [name] is a 69 y.o. male with medical history significant for intellectual disability, diabetes mellitus, hypertension.  Patient was sent to the ED from nursing home with reports of abnormal MRI.  History is mostly obtained from chart review as patient's is unable to communicate due to baseline intellectual disability.  Over the past month, patient has been having bilateral lower extremity weakness, with urinary retention.  At the of my evaluation he shakes his head - No to any other complaints.     Patient had thoracic and lumbar MRI 08/25/20.  With lumbar MRI showing abnormal appearance of L3-L4 disc space with prominent edema in the L3-L4 vertebral bodies and possible small endplate erosions but no significant paravertebral soft tissue inflammation.  Findings indeterminate for infectious discitis-osteomyelitis versus prominent degenerative edema.  CT cervical spine shows stenosis. S/P ACDF at C3/4, 4/5 08/18/2020 as well as L3-4 disc aspiration. Post-op nursing noted LUE weakness. MRI cervical spine showed normal post-op edema with no signs of hematoma.    PT Comments    Patient reassessed and chart further reviewed due to apparent change in presentation since initial PT/OT evaluations. LUE significantly weaker than right and first noted by RN 09/03/20. Per H&P, pt had trace movement in legs on admission. Pt currently moves legs into flexor and adduction via flexor tone, demonstrates trace hip extension bil, and 0/5 quadriceps. Pt lying on right side with legs flexed up almost to chest on arrival. Session focused on bil LE ROM/stretching, rolling,and repositioning with pillows and blanket rolls to minimize pt's rt lateral lean/curvature when in supine. Goals updated as not currently appropriate based on presentation.   Follow Up Recommendations   SNF     Equipment Recommendations  None recommended by PT;Other (comment) (TBD)    Recommendations for Other Services       Precautions / Restrictions Precautions Precautions: Fall Precaution Comments: pt with flexor tone in LEs; 0/5 quadriceps bil    Mobility  Bed Mobility Overal bed mobility: Needs Assistance Bed Mobility: Rolling Rolling: Total assist         General bed mobility comments: rolling rt and lt for pad placement with max assist (pt uses RUE to assist as able); scoot to [facility] +2 total    Transfers                 General transfer comment: only appropriate for lift transfers at this time  Ambulation/Gait             General Gait Details: unable   Stairs             Wheelchair Mobility    Modified Rankin (Stroke Patients Only)       Balance                                            Cognition Arousal/Alertness: Awake/alert Behavior During Therapy: Anxious Overall Cognitive Status: History of cognitive impairments - at baseline                                 General Comments: Pt at baseline with cognition per aunt (on recent visit); Follows commands with 3 extremities (LUE excluded)  Exercises General Exercises - Lower Extremity Ankle Circles/Pumps: PROM;Both (heel cord stretching) Short Arc Quad: PROM;Both;5 reps (no quad activation) Heel Slides: AAROM;Both;5 reps (tone assists with flexion; able to initiate hip extension for return to start position)    General Comments General comments (skin integrity, edema, etc.): Strength re-assessed as goals not appropriate based on current presentation. Pt with 0/5 quads, trace hip extension and +flexor with adduction tone in bil LEs      Pertinent Vitals/Pain Pain Assessment: Faces Faces Pain Scale: Hurts even more Pain Location: nods "no" to low back hurting; "yes" to legs hurting with ROM/stretching Pain Descriptors / Indicators:  Grimacing Pain Intervention(s): Limited activity within patient's tolerance;Monitored during session;Repositioned    Home Living                      Prior Function            PT Goals (current goals can now be found in the care plan section) Acute Rehab PT Goals Patient Stated Goal: to return to SNF Time For Goal Achievement: 09/13/20 Potential to Achieve Goals: Good Progress towards PT goals: Goals downgraded-see care plan    Frequency    Min 2X/week      PT Plan Current plan remains appropriate;Frequency needs to be updated    Co-evaluation              AM-PAC PT "6 Clicks" Mobility   Outcome Measure  Help needed turning from your back to your side while in a flat bed without using bedrails?: Total Help needed moving from lying on your back to sitting on the side of a flat bed without using bedrails?: Total Help needed moving to and from a bed to a chair (including a wheelchair)?: Total Help needed standing up from a chair using your arms (e.g., wheelchair or bedside chair)?: Total Help needed to walk in hospital room?: Total Help needed climbing 3-5 steps with a railing? : Total 6 Click Score: 6    End of Session   Activity Tolerance: Patient limited by pain Patient left: with call bell/phone within reach;with bed alarm set;in bed;Other (comment) (with pillows and blanket rolls to right side to promote lengthening and between legs for skin protection) Nurse Communication: Mobility status;Need for lift equipment PT Visit Diagnosis: Other abnormalities of gait and mobility (R26.89);Muscle weakness (generalized) (M62.81);Pain     Time: 4709-6283 PT Time Calculation (min) (ACUTE ONLY): 37 min  Charges:  $Therapeutic Exercise: 23-37 mins                      Jerolyn Center, PT Pager [phone]    Zena Amos 09/07/2020, 12:11 PM

## 2020-09-07 NOTE — TOC Progression Note (Signed)
Transition of Care [facility]) - Progression Note    Patient Details  Name: [name] MRN: [id number] Date of Birth: July 05, 1950  Transition [facility] CM/SW Contact  Lorri Frederick, LCSW Phone Number: 09/07/2020, 4:07 PM  Clinical Narrative:   Pt has bed at [facility] when medically ready.  CSW continues to follow.     Expected Discharge Plan: Skilled Nursing Facility Barriers to Discharge: Continued Medical Work up  Expected Discharge Plan and Services Expected Discharge Plan: Skilled Nursing Facility                                               Social Determinants of Health (SDOH) Interventions    Readmission Risk Interventions Readmission Risk Prevention Plan 05/26/2019 09/20/2018 09/19/2018  Transportation Screening Complete Complete Complete  PCP or Specialist Appt within 3-5 Days Not Complete - -  HRI or Home Care Consult Complete - -  Social Work Consult for Recovery Care Planning/Counseling Complete - -  Palliative Care Screening Not Complete - -  Medication Review Oceanographer) Complete Complete Complete  PCP or Specialist appointment within 3-5 days of discharge - Complete Complete  PCP/Specialist Appt Not Complete comments - - SNF resident. Sees facility MD  HRI or Home Care Consult - Not Complete -  HRI or Home Care Consult Pt Refusal Comments - Patient is discharging to SNF for rehab -  SW Recovery Care/Counseling Consult - Complete -  Palliative Care Screening - Not Applicable -  Skilled Nursing Facility - Complete -  Some recent data might be hidden

## 2020-09-07 NOTE — Progress Notes (Signed)
  Speech Language Pathology Treatment: Dysphagia  Patient Details Name: [name] MRN: [id number] DOB: 01-Feb-1951 Today's Date: 09/07/2020 Time: 6237-6283 SLP Time Calculation (min) (ACUTE ONLY): 15.18 min  Assessment / Plan / Recommendation Clinical Impression   Pt was seen for dysphagia treatment. He was alert and cooperative during the session. Pt's swallow function appears further improved compared to his presentation on 5/24 and he was able to follow some commands. Pt continues to demonstrate multiple swallows, but fewer were noted than during the initial evaluation and pt's hyolaryngeal movement subjectively appears improved. Pt demonstrated delayed coughing with thin liquids via tsp and inconsistently with nectar thick liquids. No s/sx of aspiration were noted with puree solids or with honey thick liquids. A dysphagia 1 diet with honey thick liquids will be initiated at this time. SLP will follow to assess tolerance and for further advancement as clinically indicated.    HPI HPI: Pt is a 70 y.o. male who was sent to the ED from SNF with reports of abnormal MRI. CXR 5/22: Abnormal bilateral perihilar opacity suspicious for flash pulmonary edema. Aspiration or infection felt less likely by radiologist. MRI 5/16: No acute abnormality identified. MR cervical spine: postoperative changes of C3-C5 ACDF 5/19. Persistent severe canal stenosis at the C3-4 level. Neurosurgery was consulted on admission who recommended IR consult for aspiration. PMH: intellectual disability with inability to communicate, diabetes mellitus, hypertension.      SLP Plan  Continue with current plan of care       Recommendations  Diet recommendations: Dysphagia 1 (puree);Honey-thick liquid Liquids provided via: Cup Medication Administration: Crushed with puree (as tolerated) Supervision: Trained caregiver to feed patient;Full supervision/cueing for compensatory strategies Compensations: Slow rate;Small  sips/bites;Follow solids with liquid Postural Changes and/or Swallow Maneuvers: Seated upright 90 degrees;Upright 30-60 min after meal                Oral Care Recommendations: Oral care QID;Oral care prior to ice chip/H20 SLP Visit Diagnosis: Dysphagia, pharyngeal phase (R13.13) Plan: Continue with current plan of care       Latavia Goga I. Vear Clock, MS, CCC-SLP Acute Rehabilitation Services Office number [phone] Pager [phone]                Scheryl Marten 09/07/2020, 10:47 AM

## 2020-09-07 NOTE — Progress Notes (Signed)
Occupational Therapy Treatment Patient Details Name: [name] MRN: [id number] DOB: 11-May-1950 Today's Date: 09/07/2020    History of present illness [name] is a 70 y.o. male with medical history significant for intellectual disability, diabetes mellitus, hypertension.  Patient was sent to the ED from nursing home with reports of abnormal MRI.  History is mostly obtained from chart review as patient's is unable to communicate due to baseline intellectual disability.  Over the past month, patient has been having bilateral lower extremity weakness, with urinary retention.  At the of my evaluation he shakes his head - No to any other complaints.     Patient had thoracic and lumbar MRI 08/25/20.  With lumbar MRI showing abnormal appearance of L3-L4 disc space with prominent edema in the L3-L4 vertebral bodies and possible small endplate erosions but no significant paravertebral soft tissue inflammation.  Findings indeterminate for infectious discitis-osteomyelitis versus prominent degenerative edema.  CT cervical spine shows stenosis. S/P ACDF at C3/4, 4/5 08/19/2020 as well as L3-4 disc aspiration. Post-op nursing noted LUE weakness. MRI cervical spine showed normal post-op edema with no signs of hematoma.[name] is a 70 y.o. male with medical history significant for intellectual disability, diabetes mellitus, hypertension.  Patient was sent to the ED from nursing home with reports of abnormal MRI.  History is mostly obtained from chart review as patient's is unable to communicate due to baseline intellectual disability.  Over the past month, patient has been having bilateral lower extremity weakness, with urinary retention.   Patient had thoracic and lumbar MRI 08/25/20.  With lumbar MRI showing abnormal appearance of L3-L4 disc space with prominent edema in the L3-L4 vertebral bodies and possible small endplate erosions but no significant paravertebral soft tissue inflammation.  Findings  indeterminate for infectious discitis-osteomyelitis versus prominent degenerative edema.  CT cervical spine shows stenosis with cored compression. S/P ACDF at C3/4, 4/5 08/18/2020 as well as L3-4 disc aspiration. Post-op nursing noted LUE weakness. MRI cervical spine showed normal post-op edema with no signs of hematoma.   OT comments  Pt was able to feed self with Rt UE with min A, but fatigues rapidly.  Pt with increased coughing toward end of session, and indicated irritation/discomfort in his throat.   HR 127-141, and Sp02 decreased to high 60's to low 70s, however when Nelcor probe adjusted, sp02 the reading improved into the 90s, but would drift back down.  Nelcor probe changed, with new probe reading sp02 in the high 60s - RN notified, and 02 increased to 5L with sp02 in the mid 90s.  Pt with no noticeable active movement of Lt UE during session.   Goals downgraded as pt demonstrates a decline in function.    Follow Up Recommendations  Supervision/Assistance - 24 hour;SNF    Equipment Recommendations  None recommended by OT    Recommendations for Other Services      Precautions / Restrictions Precautions Precautions: Fall Precaution Comments: pt with flexor tone in LEs; 0/5 quadriceps bil       Mobility Bed Mobility Overal bed mobility: Needs Assistance Bed Mobility: Rolling Rolling: Total assist              Transfers                      Balance  ADL either performed or assessed with clinical judgement   ADL Overall ADL's : Needs assistance/impaired Eating/Feeding: Moderate assistance Eating/Feeding Details (indicate cue type and reason): Pt is able to scoop food from container and bring to mouth, but fatigues with activity.  Pt with frequent clearing of throat and occasional coughing.  Sp02 in 70s with poor pleth.  His Nelcor was adjusted with improvement to 91-95%.  HR 127.   However, Sp02 on monitor  continued to drop to high 60s and low 70s.  New nelcor probe acquired and placed on finger with new nelcor showing decreased Sp02 68% (correlated wtih old Nelcor probe).   RN called to room and notified, who notified MD. HR max 141.  02 increased to 5L with improvement of sp02 to 94% and HR in 120s.  feeding activity discontinued                                         Vision       Perception     Praxis      Cognition Arousal/Alertness: Awake/alert Behavior During Therapy: WFL for tasks assessed/performed;Impulsive Overall Cognitive Status: No family/caregiver present to determine baseline cognitive functioning                                 General Comments: No family present.  Pt follows commands inconsistently        Exercises     Shoulder Instructions       General Comments see comments under self feeding    Pertinent Vitals/ Pain       Pain Assessment: Faces Faces Pain Scale: Hurts little more Pain Location: consistently rubs at neck and grimmaces Pain Descriptors / Indicators: Grimacing Pain Intervention(s): Monitored during session  Home Living                                          Prior Functioning/Environment              Frequency  Min 2X/week        Progress Toward Goals  OT Goals(current goals can now be found in the care plan section)  Progress towards OT goals: Not progressing toward goals - comment  Acute Rehab OT Goals OT Goal Formulation: Patient unable to participate in goal setting Time For Goal Achievement: 09/22/20 Potential to Achieve Goals: Fair ADL Goals Pt Will Perform Upper Body Dressing: with mod assist;sitting Pt Will Transfer to Toilet: with max assist;squat pivot transfer;bedside commode  Plan Discharge plan remains appropriate (goals modified)    Co-evaluation                 AM-PAC OT "6 Clicks" Daily Activity     Outcome Measure   Help from another  person eating meals?: A Lot Help from another person taking care of personal grooming?: A Lot Help from another person toileting, which includes using toliet, bedpan, or urinal?: Total Help from another person bathing (including washing, rinsing, drying)?: A Lot Help from another person to put on and taking off regular upper body clothing?: Total Help from another person to put on and taking off regular lower body clothing?: Total 6 Click Score: 9    End of Session Equipment Utilized  During Treatment: Oxygen  OT Visit Diagnosis: Other abnormalities of gait and mobility (R26.89);Muscle weakness (generalized) (M62.81);Other symptoms and signs involving the nervous system (R29.898);Other symptoms and signs involving cognitive function;Pain Pain - part of body:  (neck)   Activity Tolerance Treatment limited secondary to medical complications (Comment)   Patient Left in bed;with call bell/phone within reach;with nursing/sitter in room   Nurse Communication Mobility status;Other (comment) (HR elevated; sp02 decreased)        Time: 0350-0938 OT Time Calculation (min): 33 min  Charges: OT General Charges $OT Visit: 1 Visit OT Treatments $Self Care/Home Management : 23-37 mins  Eber Jones., OTR/L Acute Rehabilitation Services Pager [phone] Office [phone]    Jeani Hawking M 09/07/2020, 5:13 PM

## 2020-09-07 NOTE — Progress Notes (Signed)
Patient ID: [name], male   DOB: 02/20/1951, 70 y.o.   MRN: [id number] Alert, follows commands Little spontaneous if any left upper extremity movement No cord infarct. Only the left upper, both lower extremities are moving very well Right upper extremity also normal May be something very painful in shoulder or another joint. But his lack of movement with regard to arm, forearm, digits, wrist does not fit neurologically given movement in the lower extremities

## 2020-09-08 DIAGNOSIS — M4712 Other spondylosis with myelopathy, cervical region: Secondary | ICD-10-CM | POA: Diagnosis not present

## 2020-09-08 DIAGNOSIS — Z515 Encounter for palliative care: Secondary | ICD-10-CM | POA: Diagnosis not present

## 2020-09-08 DIAGNOSIS — G9341 Metabolic encephalopathy: Secondary | ICD-10-CM | POA: Diagnosis present

## 2020-09-08 DIAGNOSIS — R29898 Other symptoms and signs involving the musculoskeletal system: Secondary | ICD-10-CM | POA: Diagnosis not present

## 2020-09-08 DIAGNOSIS — R0603 Acute respiratory distress: Secondary | ICD-10-CM | POA: Diagnosis not present

## 2020-09-08 DIAGNOSIS — J9601 Acute respiratory failure with hypoxia: Secondary | ICD-10-CM | POA: Diagnosis not present

## 2020-09-08 DIAGNOSIS — E43 Unspecified severe protein-calorie malnutrition: Secondary | ICD-10-CM

## 2020-09-08 DIAGNOSIS — E872 Acidosis, unspecified: Secondary | ICD-10-CM | POA: Diagnosis present

## 2020-09-08 DIAGNOSIS — A4151 Sepsis due to Escherichia coli [E. coli]: Secondary | ICD-10-CM | POA: Diagnosis present

## 2020-09-08 DIAGNOSIS — Z66 Do not resuscitate: Secondary | ICD-10-CM | POA: Diagnosis present

## 2020-09-08 DIAGNOSIS — E870 Hyperosmolality and hypernatremia: Secondary | ICD-10-CM | POA: Diagnosis not present

## 2020-09-08 DIAGNOSIS — E119 Type 2 diabetes mellitus without complications: Secondary | ICD-10-CM | POA: Diagnosis not present

## 2020-09-08 DIAGNOSIS — J690 Pneumonitis due to inhalation of food and vomit: Secondary | ICD-10-CM | POA: Clinically undetermined

## 2020-09-08 LAB — CBC
HCT: 35.1 % — ABNORMAL LOW (ref 39.0–52.0)
Hemoglobin: 10.8 g/dL — ABNORMAL LOW (ref 13.0–17.0)
MCH: 26.9 pg (ref 26.0–34.0)
MCHC: 30.8 g/dL (ref 30.0–36.0)
MCV: 87.3 fL (ref 80.0–100.0)
Platelets: 315 10*3/uL (ref 150–400)
RBC: 4.02 MIL/uL — ABNORMAL LOW (ref 4.22–5.81)
RDW: 17.6 % — ABNORMAL HIGH (ref 11.5–15.5)
WBC: 7.5 10*3/uL (ref 4.0–10.5)
nRBC: 0 % (ref 0.0–0.2)

## 2020-09-08 LAB — BASIC METABOLIC PANEL
Anion gap: 16 — ABNORMAL HIGH (ref 5–15)
BUN: 36 mg/dL — ABNORMAL HIGH (ref 8–23)
CO2: 29 mmol/L (ref 22–32)
Calcium: 9.1 mg/dL (ref 8.9–10.3)
Chloride: 101 mmol/L (ref 98–111)
Creatinine, Ser: 0.91 mg/dL (ref 0.61–1.24)
GFR, Estimated: >60 mL/min (ref >60)
Glucose, Bld: 195 mg/dL — ABNORMAL HIGH (ref 70–99)
Potassium: 3.6 mmol/L (ref 3.5–5.1)
Sodium: 146 mmol/L — ABNORMAL HIGH (ref 135–145)

## 2020-09-08 LAB — GLUCOSE, CAPILLARY
Glucose-Capillary: 192 mg/dL — ABNORMAL HIGH (ref 70–99)
Glucose-Capillary: 239 mg/dL — ABNORMAL HIGH (ref 70–99)

## 2020-09-08 MED ORDER — DEXTROSE-NACL 5-0.45 % IV SOLN: INTRAVENOUS | Status: DC

## 2020-09-08 MED ORDER — GLYCOPYRROLATE 1 MG PO TABS: 1.0000 mg | ORAL_TABLET | ORAL | Status: DC | PRN

## 2020-09-08 MED ORDER — LORAZEPAM 1 MG PO TABS: 1.0000 mg | ORAL_TABLET | ORAL | Status: DC | PRN

## 2020-09-08 MED ORDER — LORAZEPAM 2 MG/ML IJ SOLN: 1.0000 mg | INTRAMUSCULAR | Status: DC | PRN

## 2020-09-08 MED ORDER — MORPHINE SULFATE (CONCENTRATE) 10 MG/0.5ML PO SOLN: 5.0000 mg | ORAL | Status: DC | PRN

## 2020-09-08 MED ORDER — BIOTENE DRY MOUTH MT LIQD: 15.0000 mL | OROMUCOSAL | Status: DC | PRN

## 2020-09-08 MED ORDER — POLYVINYL ALCOHOL 1.4 % OP SOLN: 1.0000 [drp] | Freq: Four times a day (QID) | OPHTHALMIC | Status: DC | PRN

## 2020-09-08 MED ORDER — MORPHINE SULFATE (PF) 2 MG/ML IV SOLN: 1.0000 mg | INTRAVENOUS | Status: DC | PRN

## 2020-09-08 MED ORDER — GLYCOPYRROLATE 0.2 MG/ML IJ SOLN: 0.2000 mg | INTRAMUSCULAR | Status: DC | PRN

## 2020-09-08 MED ORDER — LORAZEPAM 2 MG/ML PO CONC: 1.0000 mg | ORAL | Status: DC | PRN

## 2020-09-14 NOTE — Progress Notes (Signed)
PROGRESS NOTE  [name]  NAT:[id number] DOB: 1950/09/19 DOA: 09/07/2020 PCP: Pearson Grippe, MD   Brief Narrative: [name] is a 70 y.o. male with a history of intellectual disability, T2DM, HTN, HLD, BPH with frequent urinary retention, chronic foley, nephrolithiasis (left renal staghorn calculus s/p lithotripsy 01/14/2019), small bowel volvulus (s/p reduction May 2020), ileus/pseudoobstruction, frequent UTIs, E. coli bacteremia (March 2022), incidental covid-19 infection who presented to the ED on 5/16 with bilateral leg weakness and MRI from 5/12 revealing edema in the L3-L4 vertebral bodies and abnormal disc space with possible end plate erosions. WBC 11.9k, lactic acid 2.8. IR performed aspirate which has revealed E. coli with resistance patterns identical to those from prior bacteremia. Due to cervical stenosis with myelopathy at C3-4, C4-5, neurosurgery, Dr. Franky Macho, performed ACDF C3-5, arthrodesis C3-4, C4-5 on 08/20/2020. Postoperative course complicated by encephalopathy and aspiration which has recurred despite SLP evaluation and aspiration precautions. This has caused hypoxic respiratory failure despite being on appropriate IV antibiotics. Palliative care was consulted and goals of care discussions were carried out, the outcome of which was that the patient would be best served by deescalating aggressive interventions and transition to comfort measures on 09-16-20.   Assessment & Plan: Principal Problem:   Lower extremity weakness Active Problems:   Urine retention   Intellectual disability   HTN (hypertension)   Diabetes mellitus type 2, noninsulin dependent (HCC)   Nonverbal   Cervical spondylosis with myelopathy and radiculopathy   Acute respiratory distress   Lumbar discitis  Extensive chart review of recent medical history includes:  - March 2018 AP obstructive ureterolithiasis w/hydronephrosis, stent placed 3/8, UTI - Dec 2019 AP x5 days for AKI due to urinary  retention/hydro resolved with foley. DC'ed with plans for intermittent cath. Also +CDiff not on prolonged Tx. ?Pneumoperitoneum felt not to be clinically salient by general surgery - Feb 2020 [facility] x4 days gangrenous cholecystitis Tx w/cholecystostomy (placed 2/9) and zosyn > agumentin, emphysematous cystitis. SBO suggested by imaging but not clinically.  - May 2020 AP (5/11 - 5/21) x10 days for high-grade SBO w/pneumatosis of mesenteric vessels. Ex lap 08/26/2018 revealed volvulus, was reduced. Developed ileus and was ultimately discharged with mild abd distention which was felt to be chronic to SNF. - May 2020 AP readmitted 5/22 - 5/26 with recurrence of symptoms managed conservatively. Again DC'ed to SNF. - June 2020 AP readmitted 6/4 - 6/6 w/recurrent symptoms put on motegrity for pseudo-obstruction after GI and surgery consultation; and flagyl for SIBO. - Feb 2021 AP admission 2/6 - 2/12 due to abdominal distention with open cholecystectomy 2/8. Discharged to HOME w/Aunt. - March 2021 AP admission 3/19 - 3/24 w/persistent pneumoperitoneum without definite source of perforation treated with zosyn, advanced diet per surgery, DC'ed home again. - Nov 2021 - AP admission 11/14 - 11/19 presented w/abd swelling, SBO managed w/NG tube. Incidental covid-19 despite vaccination, given Regen-CoV 03/03/2020.  - 05/30/2020 started on macrobid 50mg  qHS ppx by urology, Dr. . Continue flomax, finasteride.  - March 2022 AP admission 3/2 - 3/12 with sepsis due to ESBL E. coli bacteremia (also Klebsiella UTI) and C. difficile infection treated with ceftriaxone x10 days and fosfomycin x2 doses, completed 7 day course of oral vancomycin after discharge to HOME still ambulatory with walker. - Aunt reported difficulty walking since that admission, presenting to ED 3/16, 3/24, 4/16, and ultimately at the time of this admission on 5/16 for this. During this time he was transferred to SNF.  Sepsis due to E.  coli lumbar  vertebral osteomyelitis with discitis: Hematogenous seeding from prior bacteremia.  - Continued ceftriaxone 2g IV q24h per ID recommendations. Pt would not likely tolerate prolonged IV access which would be required to definitively care for this infection (8 weeks).   Acute metabolic encephalopathy: Due to sepsis, critical illness, likely hospital delirium.  - Supportive care.   Acute hypoxic respiratory failure due to aspiration pneumonitis: Strongly suspected continued silent as well as grossly overt aspiration causing progressively worsening hypoxia.  - The patient is requiring multiple restraints to keep oxygen on and even with this removes the nasal cannula very quickly. He's obviously bothered by it. SpO2 dips into 50-60%'s during these times. CXR ordered and personally reviewed without focal infiltrate, pulmonary edema, pleural effusion, or other reversible etiology. Has worsened despite making NPO and giving antibiotics as above. Will allow patient to come off oxygen as part of taking away all medical interventions he so clearly dislikes.  - Treat dyspnea with morphine (either PO/SL or IV)  Hypernatremia: Changed IVF to hypotonic saline with dextrose. Will DC gtt now to allow saline lock.   Severe spinal stenosis C3/4 and C4/5 with cord compression and myelopathy: s/p Anterior Cervical decompression C3-5; Arthrodesis C3/4,4/5with 7, and 8mm structural allografts; Anterior instrumentation(Globus resonate) C3-5 by Dr. Franky Macho on 09/05/2020. Still with some residual stenosis, postoperative epidural fluid collection.  - Pain control as ordered.  Lactic acidosis: Resolved.  T2DM: Comfort measures.   Hyperlipidemia: DC statin  Intellectual disability: nonverbal baseline but follows commands. Legal guardian, the patient's [name], is at bedside often.   Constipation, history of ileus: Abd benign, not distended. In effort to minimize pill burden, DC stool softeners.  Urinary  retention, BPH:  - Continue home flomax, finasteride - Continue foley, last exchanged 5/3 at outside facility.   RN Pressure Injury Documentation: Pressure Injury 09/05/20 Thigh Left;Posterior;Proximal Deep Tissue Pressure Injury - Purple or maroon localized area of discolored intact skin or blood-filled blister due to damage of underlying soft tissue from pressure and/or shear. (Active)  09/05/20 1509  Location: Thigh  Location Orientation: Left;Posterior;Proximal  Staging: Deep Tissue Pressure Injury - Purple or maroon localized area of discolored intact skin or blood-filled blister due to damage of underlying soft tissue from pressure and/or shear.  Wound Description (Comments):   Present on Admission: No   DVT prophylaxis: None Code Status: DNR Family Communication: None at bedside this AM. Spoke with legal guardian at bedside for about an hour this afternoon. Disposition Plan:  Status is: Inpatient due to severity of illness. If patient survives and is felt to be stable for transfer from hospital, would consider home w/hospice vs. residential.              Patient currently is not medically stable to d/c.  Consultants:   ID  IR  Neurosurgery  Palliative care  Procedures:  ANTERIOR CERVICAL DISCECTOMY FUSION CERVICAL THREE-FOUR AND ,CERVICAL FOUR -FIVE Coletta Memos, MD 09/13/2020  L3-L4 fluoroguided aspirations x 2 passes. x1 with 21G and x1 with 20 G needles.  Julieanne Cotton, MD 09/04/2020   Antimicrobials:  Ceftriaxone   Subjective: Nonverbal but follows commands, requiring frequent redirection to leave medical devices, oxygen alone.   Objective: Vitals:   09/07/20 1600 09/07/20 2300 September 21, 2020 0800 09-21-20 1200  BP: 120/61  (!) 142/61 (!) 117/53  Pulse: (!) 120  (!) 122 (!) 121  Resp: Temp:   98.1 F (36.7 C) 98.9 F (37.2 C)  TempSrc:  Oral Oral  SpO2: 94%  96% 97%  Weight:      Height:        Intake/Output Summary (Last 24 hours) at  08/21/2020 1451 Last data filed at 08/28/2020 0645 Gross per 24 hour  Intake 1041.77 ml  Output --  Net 1041.77 ml   Filed Weights   Feb 03, 2021 1451 08/30/20 0201  Weight: 72.6 kg 53.9 kg   Gen: 70 y.o. male in no distress Pulm: Nonlabored tachypnea, coarse upper and lower airway sounds throughout. CV: Regular tachycardia into 130's. No murmur, rub, or gallop. No JVD, no dependent edema. GI: Abdomen soft, extremely scaphoid, non-tender, non-distended, with hypoactive bowel sounds.  Ext: Warm, legs held in hip and knee full flexion. Left UE with 1/5 strength in fingers, elbow and shoulder.  Skin: No new rashes, lesions or ulcers on visualized skin. Neuro: Alert, follows commands Psych: Restless  Data Reviewed: I have personally reviewed following labs and imaging studies  CBC: Recent Labs  Lab 09/04/20 0229 09/05/20 0809 09/06/20 0048 09/07/20 0257 08/17/2020 0337  WBC 5.6 7.2 5.5 6.6 7.5  HGB 11.4* 10.9* 10.9* 10.6* 10.8*  HCT 35.3* 34.9* 34.4* 34.3* 35.1*  MCV 85.1 86.0 84.5 86.6 87.3  PLT 314 372 371 313 315   Basic Metabolic Panel: Recent Labs  Lab 09/04/20 0229 09/05/20 0809 09/06/20 0048 09/07/20 0257 09/12/2020 0337  NA 135 138 141 145 146*  K 3.7 4.0 3.3* 3.5 3.6  CL 99 101 101 103 101  CO2 25 24 26 27 29   GLUCOSE 103* 146* 153* 175* 195*  BUN <5* 23 29* 36* 36*  CREATININE 0.58* 1.07 1.02 1.09 0.91  CALCIUM 8.7* 8.8* 8.9 9.1 9.1   GFR: Estimated Creatinine Clearance: 57.6 mL/min (by C-G formula based on SCr of 0.91 mg/dL). Liver Function Tests: No results for input(s): AST, ALT, ALKPHOS, BILITOT, PROT, ALBUMIN in the last 168 hours. No results for input(s): LIPASE, AMYLASE in the last 168 hours. No results for input(s): AMMONIA in the last 168 hours. Coagulation Profile: No results for input(s): INR, PROTIME in the last 168 hours. Cardiac Enzymes: No results for input(s): CKTOTAL, CKMB, CKMBINDEX, TROPONINI in the last 168 hours. BNP (last 3 results) No  results for input(s): PROBNP in the last 8760 hours. HbA1C: No results for input(s): HGBA1C in the last 72 hours. CBG: Recent Labs  Lab 09/07/20 1220 09/07/20 1735 09/07/20 2330 09/06/2020 0530 08/21/2020 1207  GLUCAP 239* 224* 179* 192* 239*   Lipid Profile: No results for input(s): CHOL, HDL, LDLCALC, TRIG, CHOLHDL, LDLDIRECT in the last 72 hours. Thyroid Function Tests: No results for input(s): TSH, T4TOTAL, FREET4, T3FREE, THYROIDAB in the last 72 hours. Anemia Panel: No results for input(s): VITAMINB12, FOLATE, FERRITIN, TIBC, IRON, RETICCTPCT in the last 72 hours. Urine analysis:    Component Value Date/Time   COLORURINE YELLOW 07/30/2020 1614   APPEARANCEUR CLOUDY (A) 07/30/2020 1614   APPEARANCEUR Cloudy (A) 03/17/2020 1621   LABSPEC 1.015 07/30/2020 1614   PHURINE 6.0 07/30/2020 1614   GLUCOSEU 50 (A) 07/30/2020 1614   HGBUR MODERATE (A) 07/30/2020 1614   BILIRUBINUR NEGATIVE 07/30/2020 1614   BILIRUBINUR Negative 03/17/2020 1621   KETONESUR NEGATIVE 07/30/2020 1614   PROTEINUR >=300 (A) 07/30/2020 1614   UROBILINOGEN 1.0 07/20/2014 1718   NITRITE NEGATIVE 07/30/2020 1614   LEUKOCYTESUR LARGE (A) 07/30/2020 1614   Recent Results (from the past 240 hour(s))  Culture, blood (routine x 2)     Status: None   Collection Time: Feb 03, 2021  4:03 PM   Specimen: BLOOD RIGHT ARM  Result Value Ref Range Status   Specimen Description   Final    BLOOD RIGHT ARM Performed at [facility], [address], [place], [place] [address]    Special Requests   Final    BOTTLES DRAWN AEROBIC AND ANAEROBIC Blood Culture adequate volume Performed at [facility], [address], [place], [place] [address]    Culture   Final    NO GROWTH 5 DAYS Performed at [facility], [address] [place], [place] [address]    Report Status 09/03/2020 FINAL  Final  Culture, blood (routine x 2)     Status: None   Collection Time: 08/18/2020  4:03 PM   Specimen: BLOOD RIGHT HAND   Result Value Ref Range Status   Specimen Description   Final    BLOOD RIGHT HAND Performed at [facility], [address] [place], [place] [address]    Special Requests   Final    BOTTLES DRAWN AEROBIC AND ANAEROBIC Blood Culture adequate volume Performed at [facility], [address] [place], [place] [address]    Culture   Final    NO GROWTH 5 DAYS Performed at [facility], [address] [place], [place] [address]    Report Status 09/03/2020 FINAL  Final  Resp Panel by RT-PCR (Flu A&B, Covid) Nasopharyngeal Swab     Status: None   Collection Time: 09/10/2020  6:05 PM   Specimen: Nasopharyngeal Swab; Nasopharyngeal(NP) swabs in vial transport medium  Result Value Ref Range Status   SARS Coronavirus 2 by RT PCR NEGATIVE NEGATIVE Final    Comment: (NOTE) SARS-CoV-2 target nucleic acids are NOT DETECTED.  The SARS-CoV-2 RNA is generally detectable in upper respiratory specimens during the acute phase of infection. The lowest concentration of SARS-CoV-2 viral copies this assay can detect is 138 copies/mL. A negative result does not preclude SARS-Cov-2 infection and should not be used as the sole basis for treatment or other patient management decisions. A negative result may occur with  improper specimen collection/handling, submission of specimen other than nasopharyngeal swab, presence of viral mutation(s) within the areas targeted by this assay, and inadequate number of viral copies(<138 copies/mL). A negative result must be combined with clinical observations, patient history, and epidemiological information. The expected result is Negative.  Fact Sheet for Patients:  BloggerCourse.com  Fact Sheet for Healthcare Providers:  SeriousBroker.it  This test is no t yet approved or cleared by the [place] FDA and  has been authorized for detection and/or diagnosis of SARS-CoV-2 by FDA under an Emergency Use  Authorization (EUA). This EUA will remain  in effect (meaning this test can be used) for the duration of the COVID-19 declaration under Section 564(b)(1) of the Act, 21 U.S.C.section 360bbb-3(b)(1), unless the authorization is terminated  or revoked sooner.       Influenza A by PCR NEGATIVE NEGATIVE Final   Influenza B by PCR NEGATIVE NEGATIVE Final    Comment: (NOTE) The Xpert Xpress SARS-CoV-2/FLU/RSV plus assay is intended as an aid in the diagnosis of influenza from Nasopharyngeal swab specimens and should not be used as a sole basis for treatment. Nasal washings and aspirates are unacceptable for Xpert Xpress SARS-CoV-2/FLU/RSV testing.  Fact Sheet for Patients: BloggerCourse.com  Fact Sheet for Healthcare Providers: SeriousBroker.it  This test is not yet approved or cleared by the [place] FDA and has been authorized for detection and/or diagnosis of SARS-CoV-2 by FDA under an  Emergency Use Authorization (EUA). This EUA will remain in effect (meaning this test can be used) for the duration of the COVID-19 declaration under Section 564(b)(1) of the Act, 21 U.S.C. section 360bbb-3(b)(1), unless the authorization is terminated or revoked.  Performed at [facility], [address] [place], [place] [address]   Surgical PCR screen     Status: None   Collection Time: 08/31/20 11:36 AM   Specimen: Nasal Mucosa; Nasal Swab  Result Value Ref Range Status   MRSA, PCR NEGATIVE NEGATIVE Final   Staphylococcus aureus NEGATIVE NEGATIVE Final    Comment: (NOTE) The Xpert SA Assay (FDA approved for NASAL specimens in patients 68 years of age and older), is one component of a comprehensive surveillance program. It is not intended to diagnose infection nor to guide or monitor treatment. Performed at [facility], [address], [place], [place] [address]   Aerobic/Anaerobic Culture w Gram Stain (surgical/deep wound)      Status: None   Collection Time: 08/31/2020  3:12 PM   Specimen: PATH Disc; Tissue  Result Value Ref Range Status   Specimen Description WOUND  Final   Special Requests L3,L4 DISC ASPIRATION  Final   Gram Stain   Final    FEW WBC PRESENT,BOTH PMN AND MONONUCLEAR NO ORGANISMS SEEN    Culture   Final    RARE ESCHERICHIA COLI NO ANAEROBES ISOLATED Performed at [facility], [address], [place], [place] [address]    Report Status 09/06/2020 FINAL  Final   Organism ID, Bacteria ESCHERICHIA COLI  Final      Susceptibility   Escherichia coli - MIC*    AMPICILLIN 8 SENSITIVE Sensitive     CEFAZOLIN <=4 SENSITIVE Sensitive     CEFEPIME <=0.12 SENSITIVE Sensitive     CEFTAZIDIME <=1 SENSITIVE Sensitive     CEFTRIAXONE <=0.25 SENSITIVE Sensitive     CIPROFLOXACIN >=4 RESISTANT Resistant     GENTAMICIN <=1 SENSITIVE Sensitive     IMIPENEM 0.5 SENSITIVE Sensitive     TRIMETH/SULFA >=320 RESISTANT Resistant     AMPICILLIN/SULBACTAM 4 SENSITIVE Sensitive     PIP/TAZO <=4 SENSITIVE Sensitive     * RARE ESCHERICHIA COLI      Radiology Studies: DG CHEST PORT 1 VIEW  Result Date: 09/07/2020 CLINICAL DATA:  Aspiration EXAM: PORTABLE CHEST 1 VIEW COMPARISON:  09/04/2020 FINDINGS: Portable markedly rotated exam to the right distorting the mediastinal and cardiac contours. Mid and lower lung patchy and nodular airspace process persists, given changes in technique this appears similar to minimally improved. No large effusion or pneumothorax. Degenerative changes of the spine. No superimposed acute CHF. IMPRESSION: Similar to minimally improved mid and lower lung bilateral airspace process. Limited exam because of rotation. Negative for CHF or large effusion. Electronically Signed   By: Judie Petit.  Shick M.D.   On: 09/07/2020 16:45   DG Abd Portable 1V  Result Date: 09/07/2020 CLINICAL DATA:  70 year old male with constipation. EXAM: PORTABLE ABDOMEN - 1 VIEW COMPARISON:  Abdominal CT dated 07/30/2020.  FINDINGS: There is moderate stool in the rectal vault. There is no bowel dilatation or evidence of obstruction. No free air. Left renal calculi. There is osteopenia with degenerative changes of the spine. No acute osseous pathology. Old fracture deformity of the right femoral neck. IMPRESSION: Moderate stool in the rectal vault. No other significant colonic stool burden. No bowel obstruction. Electronically Signed   By: Elgie Collard M.D.   On: 09/07/2020 16:44    Scheduled  Meds: . Chlorhexidine Gluconate Cloth  6 each Topical Daily  . finasteride  5 mg Oral Daily  . insulin aspart  0-9 Units Subcutaneous Q6H  . LORazepam  1 mg Intravenous Once  . magnesium oxide  400 mg Oral Daily  . sodium chloride flush  3 mL Intravenous Q12H  . tamsulosin  0.4 mg Oral Daily   Continuous Infusions: . sodium chloride Stopped (2020/09/26 2143)  . cefTRIAXone (ROCEPHIN)  IV Stopped (09/07/20 1802)  . dextrose 5 % and 0.45% NaCl 100 mL/hr at 08/15/2020 1049     LOS: 10 days   Time spent: 35 minutes.  Tyrone Nine, MD [facility] [email or web address] 08/23/2020, 2:51 PM

## 2020-09-14 NOTE — Progress Notes (Signed)
SLP Cancellation Note  Patient Details Name: [name] MRN: [id number] DOB: 18-Feb-1951   Cancelled treatment:       Reason Eval/Treat Not Completed: Other (comment) (Pt was approached for treatment. Dr. Jarvis Newcomer was in the room and Dr. Jarvis Newcomer advised that the pt will be transitioned to comfort care. SLP will sign off.)  [name] I. Vear Clock, MS, CCC-SLP Acute Rehabilitation Services Office number [phone] Pager [phone]  Scheryl Marten 08/23/2020, 4:15 PM

## 2020-09-14 NOTE — Death Summary Note (Signed)
DEATH SUMMARY   Patient Details  Name: [name] MRN: [id number] DOB: [birth year]  Admission/Discharge Information   Admit Date:  Sep 05, 2020  Date of Death: Date of Death: 2020-09-15  Time of Death: Time of Death: 1728-08-24  Length of Stay: Aug 31, 2022  Referring Physician: Pearson Grippe, MD   Reason(s) for Hospitalization  Lower extremity weakness due to vertebral osteomyelitis  Diagnoses  Preliminary cause of death: Acute hypoxic respiratory failure due to non-traumatic aspiration pneumonia and pneumonitis. Secondary Diagnoses (including complications and co-morbidities):  Principal Problem:   Acute respiratory failure with hypoxia (HCC) Active Problems:   Urine retention   Intellectual disability   Hyperlipidemia   HTN (hypertension)   Diabetes mellitus type 2, noninsulin dependent (HCC)   Hyponatremia   Hypokalemia   BPH (benign prostatic hyperplasia)   Nonverbal   Pressure injury of skin - Left posterior thigh deep tissue pressure injury, unknown if POA.   Protein-calorie malnutrition, severe (HCC)   Palliative care by specialist   Goals of care, counseling/discussion   Lower extremity weakness   Cervical spondylosis with myelopathy and radiculopathy   Lumbar discitis   DNR (do not resuscitate)   Aspiration pneumonia of both lower lobes due to gastric secretions (HCC)   Aspiration pneumonitis (HCC)   Sepsis due to Escherichia coli (E. coli) (HCC)   Acute metabolic encephalopathy   Hypernatremia   Lactic acidosis   Brief Hospital Course (including significant findings, care, treatment, and services provided and events leading to death)  KEYSTON [name] is a 70 y.o. male with a history of intellectual disability, T2DM, HTN, HLD, BPH with frequent urinary retention, chronic foley, nephrolithiasis (left renal staghorn calculus s/p lithotripsy 01/14/2019), small bowel volvulus (s/p reduction 2018-08-25), ileus/pseudoobstruction, frequent UTIs, E. coli bacteremia (March 2022),  incidental covid-19 infection who presented to the ED on 09/06/22 with bilateral leg weakness and MRI from 5/12 revealing edema in the L3-L4 vertebral bodies and abnormal disc space with possible end plate erosions. WBC 11.9k, lactic acid 2.8. IR performed aspirate which has revealed E. coli with resistance patterns identical to those from prior bacteremia. Due to cervical stenosis with myelopathy at C3-4, C4-5, neurosurgery, Dr. Franky Macho, performed ACDF C3-5, arthrodesis C3-4, C4-5 on 08/25/2020. Postoperative course complicated by encephalopathy and aspiration which has recurred despite SLP evaluation and aspiration precautions. This has caused hypoxic respiratory failure despite being on appropriate IV antibiotics. Palliative care was consulted and goals of care discussions were carried out, the outcome of which was that the patient would be best served by deescalating aggressive interventions and transition to comfort measures on Sep 15, 2020. She confirmed that the patient has been DNR for months now. Allowing the patient to take off his oxygen led to fairly rapid death though the patient remained comfortable.  Pertinent Labs and Studies  Significant Diagnostic Studies DG Cervical Spine 2 or 3 views  Result Date: 08/26/2020 CLINICAL DATA:  Cervical fusion EXAM: CERVICAL SPINE - 1 VIEW COMPARISON:  05-Sep-2020 FINDINGS: Interbody fusion is noted at C3-4 and C4-5 with anterior fixation. No soft tissue abnormality is noted. Osteophytic changes are seen at C5-6. No acute abnormality is noted. IMPRESSION: Status post C3-C5 fusion. Electronically Signed   By: Alcide Clever M.D.   On: 08/18/2020 19:21   MR BRAIN W WO CONTRAST  Result Date: 08/30/2020 CLINICAL DATA:  Unable to pass urine.  Right hemiplegia EXAM: MRI HEAD WITHOUT AND WITH CONTRAST TECHNIQUE: Multiplanar, multiecho pulse sequences of the brain and surrounding structures were obtained without and with intravenous  contrast. CONTRAST:  5mL GADAVIST  GADOBUTROL 1 MMOL/ML IV SOLN COMPARISON:  CT head 01/16/2005 FINDINGS: Brain: Negative for acute infarct. Few small white matter hyperintensities bilaterally. Ventricle size normal. No hemorrhage or mass. Motion degraded study. Progressive motion throughout the study. Postcontrast images are markedly degraded by motion. No enhancing lesion identified. Vascular: Normal arterial flow voids. Skull and upper cervical spine: Negative Sinuses/Orbits: Mild mucosal edema right maxillary sinus. Remaining sinuses clear. Negative orbit Other: None IMPRESSION: Motion degraded study. There is considerable motion especially on the postcontrast images No acute abnormality identified. Electronically Signed   By: Marlan Palau M.D.   On: 08/30/2020 14:39   MR CERVICAL SPINE WO CONTRAST  Result Date: 09/03/2020 CLINICAL DATA:  Patient is status post C3-5 ACDF 2 days ago on 08/16/2020. Weakness. Evaluate for epidural hematoma. EXAM: MRI CERVICAL SPINE WITHOUT CONTRAST TECHNIQUE: Multiplanar, multisequence MR imaging of the cervical spine was performed. No intravenous contrast was administered. COMPARISON:  MRI 09/12/2020 FINDINGS: Technical note: Examination is significantly degraded by motion artifact, particularly affecting the axial sequences and sagittal STIR sequence. Alignment: Physiologic. Vertebrae: Interval postoperative changes of C3-C5 ACDF. T2/STIR hyperintense signal within the disc spaces and posterior aspects of the C3 and C4 vertebral bodies is favored to reflect expected postoperative appearance following recent ACDF. No fractures. No evidence of discitis. No suspicious bone lesions. Congenitally narrowed canal on the basis of short pedicles. Cord: Although degraded by motion. There is suggestion of T2 hyperintense signal within the cord at the C4-5 level (series 6, image 9). No evidence of epidural fluid collection on motion degraded images. Posterior Fossa, vertebral arteries, paraspinal tissues: Soft tissue  edema with a small amount of fluid within the anterior soft tissues of the cervical spine extending from C2 to C6. No well-defined fluid collection or hematoma. Disc levels: Please note that evaluation of the cervical levels is limited secondary to significant motion degradation on axial sequences. C2-C3: No canal stenosis. Right worse than left bilateral foraminal stenosis. Unchanged. C3-C4: Interval ACDF. Severe canal stenosis persists at this level secondary to endplate spurring and posterior element hypertrophy. At least moderate bilateral foraminal stenosis. C4-C5: Interval ACDF. Interval improvement in the degree of canal stenosis, now moderate. At least moderate bilateral foraminal stenosis. C5-C6: Similar degree of mild-to-moderate canal stenosis with moderate to severe right and moderate left foraminal stenosis. C6-C7: Similar degree of mild-to-moderate canal stenosis with mild bilateral foraminal stenosis. C7-T1: Similar findings of moderate right foraminal stenosis without canal stenosis. IMPRESSION: 1. Motion degraded exam. 2. Within this limitation, there is no evidence of epidural fluid collection/hematoma or hematoma within the soft tissues of the neck. 3. Interval postoperative changes of C3-C5 ACDF. There is persistent severe canal stenosis at the C3-4 level. 4. Interval improvement in the degree of canal stenosis at the C4-5 level, now moderate. 5. Similar degree of canal and foraminal stenosis at the remaining levels. Electronically Signed   By: Duanne Guess D.O.   On: 09/03/2020 14:37   MR CERVICAL SPINE W WO CONTRAST  Result Date: 08/30/2020 CLINICAL DATA:  Nursing home patient with inability to urinate and move extremities. EXAM: MRI CERVICAL SPINE WITHOUT AND WITH CONTRAST TECHNIQUE: Multiplanar and multiecho pulse sequences of the cervical spine, to include the craniocervical junction and cervicothoracic junction, were obtained without and with intravenous contrast. CONTRAST:  5mL  GADAVIST GADOBUTROL 1 MMOL/ML IV SOLN COMPARISON:  Thoracic MRI 08/25/2020. FINDINGS: Despite efforts by the technologist and patient, mild to moderate motion artifact is present on today's exam and  could not be eliminated. This reduces exam sensitivity and specificity. Motion is greatest on the axial images. Intracranial findings are dictated separately. Alignment: Physiologic. Vertebrae: No acute or suspicious osseous findings. The spinal canal is small on a congenital basis. There is superimposed spondylosis which contributes to significant spinal stenosis at the C3-4 and C4-5 levels. Cord: Cord evaluation limited by motion. There is multilevel cord compression, especially at C3-4 and C4-5. Probable underlying cord signal abnormality at these levels due to myelomalacia. The distal cervical cord appears atrophied. Posterior Fossa, vertebral arteries, paraspinal tissues: Intracranial findings dictated separately. No significant paraspinal findings. Bilateral vertebral artery flow voids. Disc levels: C2-3: No significant findings. C3-4: Spondylosis with posterior osteophytes covering diffusely bulging disc material. Severe spinal stenosis with effacement of the CSF surrounding the cord and narrowing of the AP diameter of the canal to 5 mm. Probable abnormal cord signal consistent with myelomalacia. Limited foraminal assessment; probable moderate foraminal narrowing bilaterally. C4-5: Spondylosis with posterior osteophytes covering diffusely bulging disc material. Severe spinal stenosis with effacement of the CSF surrounding the cord and narrowing of the AP diameter of the canal to 3-4 mm. Probable abnormal cord signal consistent with myelomalacia. Limited foraminal assessment; probable moderate foraminal narrowing bilaterally. C5-6: Lesser spondylosis at this level with asymmetric uncinate spurring on the right. Mild spinal stenosis and moderate right foraminal narrowing. C6-7: Lesser spondylosis at this level with  disc bulging and posterior osteophytes. Mild spinal stenosis and mild foraminal narrowing bilaterally. C7-T1: The spinal canal is adequately patent. Mild right foraminal narrowing. IMPRESSION: 1. Multilevel spondylosis superimposed on a congenitally small spinal canal. 2. Severe multifactorial spinal stenosis at C3-4 and C4-5 with cord compression and probable abnormal cord signal most consistent with myelomalacia. 3. Mild spinal stenosis at C5-6 and C6-7. 4. Detail limited by motion. Probable moderate foraminal narrowing at the C3-4 and C4-5 levels. 5. No suspected acute findings. 6. These results will be called to the ordering clinician or representative by the Radiologist Assistant, and communication documented in the PACS or [employer]. Electronically Signed   By: Carey Bullocks M.D.   On: 08/30/2020 13:40   MR THORACIC SPINE W WO CONTRAST  Result Date: 08/26/2020 CLINICAL DATA:  Lower extremity weakness.  Urinary retention. EXAM: MRI THORACIC AND LUMBAR SPINE WITHOUT AND WITH CONTRAST TECHNIQUE: Multiplanar and multiecho pulse sequences of the thoracic and lumbar spine were obtained without and with intravenous contrast. CONTRAST:  7mL GADAVIST GADOBUTROL 1 MMOL/ML IV SOLN COMPARISON:  Thoracic and lumbar spine radiographs 06/29/2020. CT abdomen and pelvis 07/30/2020. FINDINGS: MRI THORACIC SPINE FINDINGS Alignment:  Normal. Vertebrae: Chronic T12 compression fracture with mild anterior vertebral body height loss. No acute fracture or suspicious osseous lesion. Hemangiomas in the T12 vertebral body. No evidence of discitis. Cord: Normal cord signal and morphology. No abnormal intradural enhancement. Paraspinal and other soft tissues: Unremarkable. Disc levels: Prominent dorsal epidural fat throughout the mid and lower thoracic spine. Mild thoracic spondylosis. No compressive stenosis. MRI LUMBAR SPINE FINDINGS Segmentation:  Standard. Alignment:  Trace anterolisthesis of L5 on S1. Vertebrae: There is  abnormal T2 hyperintensity in the L3-4 disc space with prominent marrow edema and moderate enhancement in the adjacent L3 and L4 vertebral bodies. There is irregularity of the L3 inferior and L4 superior endplates which may reflect small erosions or degenerative Schmorl's nodes. No fracture or suspicious focal marrow lesion is identified. Conus medullaris: Extends to the L1-2 level and appears normal. Paraspinal and other soft tissues: No significant inflammatory changes are seen in the  paraspinal soft tissues. No paraspinal or epidural fluid collection is evident. Disc levels: Diffuse congenital narrowing of the lumbar spinal canal due to short pedicles. Diffuse lumbar disc desiccation. Mild disc space narrowing at L3-4 and L4-5. L1-2: Disc bulging, a calcified central disc extrusion, endplate spurring, congenitally short pedicles, and moderate facet and ligamentum flavum hypertrophy result in moderate spinal stenosis, mild right greater than left lateral recess stenosis, and mild right neural foraminal stenosis. L2-3: Disc bulging, endplate spurring, congenitally short pedicles, and moderate facet and ligamentum flavum hypertrophy result in mild spinal stenosis, mild right greater than left lateral recess stenosis, and mild-to-moderate bilateral neural foraminal stenosis. L3-4: Circumferential disc osteophyte complex, congenitally short pedicles, and severe facet and ligamentum flavum hypertrophy result in severe spinal stenosis and severe bilateral neural foraminal stenosis. Potential bilateral L3 and L4 nerve root impingement. L4-5: Circumferential disc osteophyte complex including a prominent right paracentral component and severe posterior element hypertrophy with ligamentous ossification result in severe spinal stenosis, right greater than left lateral recess stenosis, and moderate right mild left neural foraminal stenosis. Potential bilateral neural impingement at this level, particularly of the right L5  nerve root in the lateral recess. L5-S1: Anterolisthesis with bulging uncovered disc, a left foraminal disc osteophyte complex, and severe facet hypertrophy result in mild bilateral lateral recess stenosis and mild right and severe left neural foraminal stenosis with left L5 nerve root compression in the foramen. No significant generalized spinal stenosis. IMPRESSION: 1. Abnormal appearance of the L3-4 disc space with prominent edema in the L3 and L4 vertebral bodies and possible small endplate erosions but no significant paravertebral soft tissue inflammation. Findings are indeterminate for infectious discitis-osteomyelitis versus prominent degenerative edema. Correlate for clinical and laboratory evidence of infection. 2. Congenitally short pedicles with superimposed disc and facet degeneration resulting in severe spinal stenosis at L3-4 and L4-5 and moderate spinal stenosis at L1-2. 3. Severe neural foraminal stenosis at L3-4 and L5-S1. These results will be called to the ordering clinician or representative by the Radiologist Assistant, and communication documented in the PACS or [employer]. Electronically Signed   By: Sebastian Ache M.D.   On: 08/26/2020 11:36   MR LUMBAR SPINE W WO CONTRAST  Result Date: 08/26/2020 CLINICAL DATA:  Lower extremity weakness.  Urinary retention. EXAM: MRI THORACIC AND LUMBAR SPINE WITHOUT AND WITH CONTRAST TECHNIQUE: Multiplanar and multiecho pulse sequences of the thoracic and lumbar spine were obtained without and with intravenous contrast. CONTRAST:  7mL GADAVIST GADOBUTROL 1 MMOL/ML IV SOLN COMPARISON:  Thoracic and lumbar spine radiographs 06/29/2020. CT abdomen and pelvis 07/30/2020. FINDINGS: MRI THORACIC SPINE FINDINGS Alignment:  Normal. Vertebrae: Chronic T12 compression fracture with mild anterior vertebral body height loss. No acute fracture or suspicious osseous lesion. Hemangiomas in the T12 vertebral body. No evidence of discitis. Cord: Normal cord signal  and morphology. No abnormal intradural enhancement. Paraspinal and other soft tissues: Unremarkable. Disc levels: Prominent dorsal epidural fat throughout the mid and lower thoracic spine. Mild thoracic spondylosis. No compressive stenosis. MRI LUMBAR SPINE FINDINGS Segmentation:  Standard. Alignment:  Trace anterolisthesis of L5 on S1. Vertebrae: There is abnormal T2 hyperintensity in the L3-4 disc space with prominent marrow edema and moderate enhancement in the adjacent L3 and L4 vertebral bodies. There is irregularity of the L3 inferior and L4 superior endplates which may reflect small erosions or degenerative Schmorl's nodes. No fracture or suspicious focal marrow lesion is identified. Conus medullaris: Extends to the L1-2 level and appears normal. Paraspinal and other soft tissues: No  significant inflammatory changes are seen in the paraspinal soft tissues. No paraspinal or epidural fluid collection is evident. Disc levels: Diffuse congenital narrowing of the lumbar spinal canal due to short pedicles. Diffuse lumbar disc desiccation. Mild disc space narrowing at L3-4 and L4-5. L1-2: Disc bulging, a calcified central disc extrusion, endplate spurring, congenitally short pedicles, and moderate facet and ligamentum flavum hypertrophy result in moderate spinal stenosis, mild right greater than left lateral recess stenosis, and mild right neural foraminal stenosis. L2-3: Disc bulging, endplate spurring, congenitally short pedicles, and moderate facet and ligamentum flavum hypertrophy result in mild spinal stenosis, mild right greater than left lateral recess stenosis, and mild-to-moderate bilateral neural foraminal stenosis. L3-4: Circumferential disc osteophyte complex, congenitally short pedicles, and severe facet and ligamentum flavum hypertrophy result in severe spinal stenosis and severe bilateral neural foraminal stenosis. Potential bilateral L3 and L4 nerve root impingement. L4-5: Circumferential disc  osteophyte complex including a prominent right paracentral component and severe posterior element hypertrophy with ligamentous ossification result in severe spinal stenosis, right greater than left lateral recess stenosis, and moderate right mild left neural foraminal stenosis. Potential bilateral neural impingement at this level, particularly of the right L5 nerve root in the lateral recess. L5-S1: Anterolisthesis with bulging uncovered disc, a left foraminal disc osteophyte complex, and severe facet hypertrophy result in mild bilateral lateral recess stenosis and mild right and severe left neural foraminal stenosis with left L5 nerve root compression in the foramen. No significant generalized spinal stenosis. IMPRESSION: 1. Abnormal appearance of the L3-4 disc space with prominent edema in the L3 and L4 vertebral bodies and possible small endplate erosions but no significant paravertebral soft tissue inflammation. Findings are indeterminate for infectious discitis-osteomyelitis versus prominent degenerative edema. Correlate for clinical and laboratory evidence of infection. 2. Congenitally short pedicles with superimposed disc and facet degeneration resulting in severe spinal stenosis at L3-4 and L4-5 and moderate spinal stenosis at L1-2. 3. Severe neural foraminal stenosis at L3-4 and L5-S1. These results will be called to the ordering clinician or representative by the Radiologist Assistant, and communication documented in the PACS or [employer]. Electronically Signed   By: Sebastian AcheAllen  Grady M.D.   On: 08/26/2020 11:36   DG CHEST PORT 1 VIEW  Result Date: 09/07/2020 CLINICAL DATA:  Aspiration EXAM: PORTABLE CHEST 1 VIEW COMPARISON:  09/04/2020 FINDINGS: Portable markedly rotated exam to the right distorting the mediastinal and cardiac contours. Mid and lower lung patchy and nodular airspace process persists, given changes in technique this appears similar to minimally improved. No large effusion or  pneumothorax. Degenerative changes of the spine. No superimposed acute CHF. IMPRESSION: Similar to minimally improved mid and lower lung bilateral airspace process. Limited exam because of rotation. Negative for CHF or large effusion. Electronically Signed   By: Judie PetitM.  Shick M.D.   On: 09/07/2020 16:45   DG Chest Port 1 View  Result Date: 09/04/2020 CLINICAL DATA:  70 year old male with possible L3-L4 discitis status post fluoroscopic disc aspiration, also cervical ACDF for spinal stenosis and myelopathy on February 13, 2021. Respiratory distress. EXAM: PORTABLE CHEST 1 VIEW COMPARISON:  Portable chest 07/07/2020 and earlier. FINDINGS: Portable AP semi upright view at 0913 hours. Larger lung volumes but confluent though indistinct bilateral perihilar opacity is new since March. Mediastinal contours remain within normal limits although dense retrocardiac opacity is noted, possibly left lower lobe collapse. No pneumothorax. No definite pleural effusion. Stable cholecystectomy clips. No acute osseous abnormality identified. IMPRESSION: 1. Abnormal bilateral perihilar opacity suspicious for flash pulmonary edema. Aspiration  or infection are felt less likely. 2. Increased retrocardiac density possibly due to left lower lobe collapse. Electronically Signed   By: Odessa Fleming M.D.   On: 09/04/2020 09:48   DG Abd Portable 1V  Result Date: 09/07/2020 CLINICAL DATA:  70 year old male with constipation. EXAM: PORTABLE ABDOMEN - 1 VIEW COMPARISON:  Abdominal CT dated 07/30/2020. FINDINGS: There is moderate stool in the rectal vault. There is no bowel dilatation or evidence of obstruction. No free air. Left renal calculi. There is osteopenia with degenerative changes of the spine. No acute osseous pathology. Old fracture deformity of the right femoral neck. IMPRESSION: Moderate stool in the rectal vault. No other significant colonic stool burden. No bowel obstruction. Electronically Signed   By: Elgie Collard M.D.   On: 09/07/2020  16:44   DG Swallowing Func-Speech Pathology  Result Date: 09/05/2020 Objective Swallowing Evaluation: Type of Study: MBS-Modified Barium Swallow Study  Patient Details Name: [name] MRN: [id number] Date of Birth: Dec 20, 1950 Today's Date: 09/05/2020 Time: SLP Start Time (ACUTE ONLY): 1234 -SLP Stop Time (ACUTE ONLY): 1256 SLP Time Calculation (min) (ACUTE ONLY): 21.27 min Past Medical History: Past Medical History: Diagnosis Date . Diabetes mellitus without complication (HCC)  . History of gout  . History of kidney stones  . Hypertension  . Mentally challenged  . Urinary retention  Past Surgical History: Past Surgical History: Procedure Laterality Date . ANTERIOR CERVICAL DECOMP/DISCECTOMY FUSION N/A September 23, 2020  Procedure: ANTERIOR CERVICAL DISCECTOMY FUSION CERVICAL THREE-FOUR AND  ,CERVICAL  FOUR -FIVE;  Surgeon: Coletta Memos, MD;  Location: [facility];  Service: Neurosurgery;  Laterality: N/A; . CHOLECYSTECTOMY N/A 05/25/2019  Procedure: OPEN CHOLECYSTECTOMY;  Surgeon: [name], MD;  Location: [facility];  Service: General;  Laterality: N/A; . CYSTOSCOPY W/ URETERAL STENT PLACEMENT Bilateral 06/20/2016  Procedure: CYSTOSCOPY WITH RETROGRADE PYELOGRAM/URETERAL STENT PLACEMENT;  Surgeon: Malen Gauze, MD;  Location: [facility];  Service: Urology;  Laterality: Bilateral; . CYSTOSCOPY WITH RETROGRADE PYELOGRAM, URETEROSCOPY AND STENT PLACEMENT Bilateral 08/20/2016  Procedure: CYSTOSCOPY WITH BILATERAL RETROGRADE PYELOGRAM, BILATERAL URETEROSCOPY,  LASER LITHOTRIPSY OF RIGHT URETERAL CALCULI, STONE BASKET EXTRACTION LEFT URETERAL CALCULI  AND BILATERAL URETERAL STENT EXCHANGE;  Surgeon: Malen Gauze, MD;  Location: [facility];  Service: Urology;  Laterality: Bilateral; . CYSTOSCOPY WITH RETROGRADE PYELOGRAM, URETEROSCOPY AND STENT PLACEMENT Left 01/14/2019  Procedure: CYSTOSCOPY WITH LEFT RETROGRADE PYELOGRAM, LEFT URETEROSCOPY AND LEFT URETERAL STENT PLACEMENT;  Surgeon: Malen Gauze, MD;  Location: [facility];  Service: Urology;  Laterality: Left; . CYSTOSCOPY WITH URETHRAL DILATATION  01/14/2019  Procedure: CYSTOSCOPY WITH URETHRAL DILATATION;  Surgeon: Malen Gauze, MD;  Location: [facility];  Service: Urology;; . HOLMIUM LASER APPLICATION Bilateral 08/20/2016  Procedure: HOLMIUM LASER APPLICATION;  Surgeon: Malen Gauze, MD;  Location: [facility];  Service: Urology;  Laterality: Bilateral; . HOLMIUM LASER APPLICATION Left 01/14/2019  Procedure: HOLMIUM LASER APPLICATION;  Surgeon: Malen Gauze, MD;  Location: [facility];  Service: Urology;  Laterality: Left; . IR CATHETER TUBE CHANGE  07/04/2018 . IR EXCHANGE BILIARY DRAIN  06/09/2018 . IR EXCHANGE BILIARY DRAIN  10/20/2018 . IR EXCHANGE BILIARY DRAIN  01/12/2019 . IR EXCHANGE BILIARY DRAIN  04/07/2019 . IR LUMBAR DISC ASPIRATION W/IMG GUIDE  September 23, 2020 . IR PERC CHOLECYSTOSTOMY  05/24/2018 . LAPAROTOMY N/A 08/25/2018  Procedure: EXPLORATORY LAPAROTOMY reduction of volvulus;  Surgeon: [name], MD;  Location: [facility];  Service: General;  Laterality: N/A; . LAPAROTOMY N/A 05/25/2019  Procedure: EXPLORATORY LAPAROTOMY;  Surgeon: [name], MD;  Location: [facility];  Service: General;  Laterality: N/A; . RADIOLOGY WITH ANESTHESIA N/A 09/04/2020  Procedure: IR WITH ANESTHESIA;  Surgeon: Julieanne Cotton, MD;  Location: [facility];  Service: Radiology;  Laterality: N/A; HPI: Pt is a 70 y.o. male who was sent to the ED from SNF with reports of abnormal MRI. CXR 5/22: Abnormal bilateral perihilar opacity suspicious for flash pulmonary edema. Aspiration or infection felt less likely by radiologist. MRI 5/16: No acute abnormality identified. MR cervical spine: postoperative changes of C3-C5 ACDF 5/19. Persistent severe canal stenosis at the C3-4 level. Neurosurgery was consulted on admission who recommended IR consult for aspiration. PMH: intellectual disability with inability to communicate, diabetes mellitus, hypertension.  No data recorded Assessment / Plan /  Recommendation CHL IP CLINICAL IMPRESSIONS 09/05/2020 Clinical Impression Pt presents with pharyngeal dysphagia characterized by reduced hyolaryngeal elavation, reduced anterior laryngeal movement and a pharyngeal delay. He demonstrated absent epiglottic inversion and inadequate airway closure which consistently resulted in aspiration (PAS 7). Vallecular residue and pyriform sinus residue were noted and did not clear despite pt's multiple attempts at achieving epiglottic inversion. Pt was unable to demonstrate compensatory strategies due to his difficult following commands, and additional boluses were deferred due to pt's performance. Aspiration consistently resulted in coughing, but this was ineffective in expelling aspirated material. Considering his presentation and recent ACDF, SLP questions the potential involvement of superior laryngeal nerve. Pt's risk of aspiration is high at this time and an NPO status is therefore recommended with short-term enteral nutrition since SLP in unsure of his potential rate of recovery. Pt's case was discussed with referring MD, Dr. Natale Milch, who was in agreement with NPO status. Dr. Natale Milch expressed that the pt was eating three days prior to this and that he is typically able to follow commands. He further stated that the pt had some hypoxia and mental status changes from meds which should improve over the next few days since those meds have been discontinued. Dr. Natale Milch indicated that enteral nutrition will be considered if pt is still unable to safely tolerate p.o. intake by 5/25 or 5/26. SLP will follow for dysphagia treatment. SLP Visit Diagnosis Dysphagia, unspecified (R13.10) Attention and concentration deficit following -- Frontal lobe and executive function deficit following -- Impact on safety and function Severe aspiration risk;Risk for inadequate nutrition/hydration   CHL IP TREATMENT RECOMMENDATION 09/05/2020 Treatment Recommendations Therapy as outlined in  treatment plan below   Prognosis 09/05/2020 Prognosis for Safe Diet Advancement Fair Barriers to Reach Goals Cognitive deficits;Severity of deficits Barriers/Prognosis Comment -- CHL IP DIET RECOMMENDATION 09/05/2020 SLP Diet Recommendations NPO;Alternative means - temporary Liquid Administration via -- Medication Administration Via alternative means Compensations -- Postural Changes --   CHL IP OTHER RECOMMENDATIONS 09/05/2020 Recommended Consults -- Oral Care Recommendations Oral care QID;Staff/trained caregiver to provide oral care Other Recommendations --   CHL IP FOLLOW UP RECOMMENDATIONS 09/05/2020 Follow up Recommendations (No Data)   CHL IP FREQUENCY AND DURATION 09/05/2020 Speech Therapy Frequency (ACUTE ONLY) min 2x/week Treatment Duration 2 weeks      CHL IP ORAL PHASE 09/05/2020 Oral Phase WFL Oral - Pudding Teaspoon -- Oral - Pudding Cup -- Oral - Honey Teaspoon -- Oral - Honey Cup -- Oral - Nectar Teaspoon -- Oral - Nectar Cup -- Oral - Nectar Straw -- Oral - Thin Teaspoon -- Oral - Thin Cup -- Oral - Thin Straw -- Oral - Puree -- Oral - Mech Soft -- Oral - Regular -- Oral - Multi-Consistency -- Oral - Pill --  Oral Phase - Comment --  CHL IP PHARYNGEAL PHASE 09/05/2020 Pharyngeal Phase Impaired Pharyngeal- Pudding Teaspoon -- Pharyngeal -- Pharyngeal- Pudding Cup -- Pharyngeal -- Pharyngeal- Honey Teaspoon -- Pharyngeal -- Pharyngeal- Honey Cup -- Pharyngeal -- Pharyngeal- Nectar Teaspoon -- Pharyngeal -- Pharyngeal- Nectar Cup -- Pharyngeal -- Pharyngeal- Nectar Straw -- Pharyngeal -- Pharyngeal- Thin Teaspoon Reduced epiglottic inversion;Reduced anterior laryngeal mobility;Reduced laryngeal elevation;Reduced airway/laryngeal closure;Pharyngeal residue - valleculae;Pharyngeal residue - pyriform;Penetration/Aspiration before swallow Pharyngeal Material enters airway, passes BELOW cords and not ejected out despite cough attempt by patient Pharyngeal- Thin Cup -- Pharyngeal -- Pharyngeal- Thin Straw --  Pharyngeal -- Pharyngeal- Puree Reduced epiglottic inversion;Reduced anterior laryngeal mobility;Reduced laryngeal elevation;Reduced airway/laryngeal closure;Pharyngeal residue - valleculae;Pharyngeal residue - pyriform;Penetration/Aspiration before swallow Pharyngeal Material enters airway, passes BELOW cords and not ejected out despite cough attempt by patient Pharyngeal- Mechanical Soft -- Pharyngeal -- Pharyngeal- Regular -- Pharyngeal -- Pharyngeal- Multi-consistency -- Pharyngeal -- Pharyngeal- Pill -- Pharyngeal -- Pharyngeal Comment --  Shanika I. Vear Clock, MS, CCC-SLP Acute Rehabilitation Services Office number [phone] Pager [phone] Scheryl Marten 09/05/2020, 2:29 PM              IR LUMBAR DISC ASPIRATION W/IMG GUIDE  Result Date: 09/05/2020 INDICATION: Severe low back pain.  Discitis at L3-L4. EXAM: FLUOROSCOPIC GUIDED DISC ASPIRATION AT L3-L4 MEDICATIONS: The patient is currently admitted to the hospital and receiving intravenous antibiotics. The antibiotics were administered within an appropriate time frame prior to the initiation of the procedure. ANESTHESIA/SEDATION: General anesthesia. COMPLICATIONS: None immediate. PROCEDURE: Informed written consent was obtained from the patient after a thorough discussion of the procedural risks, benefits and alternatives. All questions were addressed. Maximal Sterile Barrier Technique was utilized including caps, mask, sterile gowns, sterile gloves, sterile drape, hand hygiene and skin antiseptic. A timeout was performed prior to the initiation of the procedure. Patient was laid prone on the fluoroscopic table. The skin overlying the lumbar region was then prepped and draped in the usual sterile manner. The L3-L4 disc space was identified. A right posterolateral approach was utilized. The skin overlying disc space was infiltrated with 0.25% bupivacaine. The first pass with a 21 gauge Franseen needle, and a second pass with a 20 gauge aspiration  needle was then advanced into the L3-L4 disc space. Crossing the midline was identified. Using a 20 mL syringe, aspirates were obtained with both passes. 1 cc of aspirate was obtained with the first pass and approximately 4 cc of bloody aspirate was obtained with the second pass. They were separately sent for microbiologic analysis. Hemostasis was achieved at the skin entry sites. The patient tolerated the procedure well. IMPRESSION: Status post fluoroscopic guided aspiration at L3-L4 as described without event. Electronically Signed   By: Julieanne Cotton M.D.   On: 09/02/2020 09:37    Microbiology Recent Results (from the past 240 hour(s))  Resp Panel by RT-PCR (Flu A&B, Covid) Nasopharyngeal Swab     Status: None   Collection Time: 08/28/2020  6:05 PM   Specimen: Nasopharyngeal Swab; Nasopharyngeal(NP) swabs in vial transport medium  Result Value Ref Range Status   SARS Coronavirus 2 by RT PCR NEGATIVE NEGATIVE Final    Comment: (NOTE) SARS-CoV-2 target nucleic acids are NOT DETECTED.  The SARS-CoV-2 RNA is generally detectable in upper respiratory specimens during the acute phase of infection. The lowest concentration of SARS-CoV-2 viral copies this assay can detect is 138 copies/mL. A negative result does not preclude SARS-Cov-2 infection and should not be used as the sole basis for treatment  or other patient management decisions. A negative result may occur with  improper specimen collection/handling, submission of specimen other than nasopharyngeal swab, presence of viral mutation(s) within the areas targeted by this assay, and inadequate number of viral copies(<138 copies/mL). A negative result must be combined with clinical observations, patient history, and epidemiological information. The expected result is Negative.  Fact Sheet for Patients:  BloggerCourse.com  Fact Sheet for Healthcare Providers:  SeriousBroker.it  This  test is no t yet approved or cleared by the [place] FDA and  has been authorized for detection and/or diagnosis of SARS-CoV-2 by FDA under an Emergency Use Authorization (EUA). This EUA will remain  in effect (meaning this test can be used) for the duration of the COVID-19 declaration under Section 564(b)(1) of the Act, 21 U.S.C.section 360bbb-3(b)(1), unless the authorization is terminated  or revoked sooner.       Influenza A by PCR NEGATIVE NEGATIVE Final   Influenza B by PCR NEGATIVE NEGATIVE Final    Comment: (NOTE) The Xpert Xpress SARS-CoV-2/FLU/RSV plus assay is intended as an aid in the diagnosis of influenza from Nasopharyngeal swab specimens and should not be used as a sole basis for treatment. Nasal washings and aspirates are unacceptable for Xpert Xpress SARS-CoV-2/FLU/RSV testing.  Fact Sheet for Patients: BloggerCourse.com  Fact Sheet for Healthcare Providers: SeriousBroker.it  This test is not yet approved or cleared by the [place] FDA and has been authorized for detection and/or diagnosis of SARS-CoV-2 by FDA under an Emergency Use Authorization (EUA). This EUA will remain in effect (meaning this test can be used) for the duration of the COVID-19 declaration under Section 564(b)(1) of the Act, 21 U.S.C. section 360bbb-3(b)(1), unless the authorization is terminated or revoked.  Performed at [facility], [address] [place], [place] [address]   Surgical PCR screen     Status: None   Collection Time: 08/31/20 11:36 AM   Specimen: Nasal Mucosa; Nasal Swab  Result Value Ref Range Status   MRSA, PCR NEGATIVE NEGATIVE Final   Staphylococcus aureus NEGATIVE NEGATIVE Final    Comment: (NOTE) The Xpert SA Assay (FDA approved for NASAL specimens in patients 90 years of age and older), is one component of a comprehensive surveillance program. It is not intended to diagnose infection nor to guide or  monitor treatment. Performed at [facility], [address], [place], [place] [address]   Aerobic/Anaerobic Culture w Gram Stain (surgical/deep wound)     Status: None   Collection Time: 2020-09-17  3:12 PM   Specimen: PATH Disc; Tissue  Result Value Ref Range Status   Specimen Description WOUND  Final   Special Requests L3,L4 DISC ASPIRATION  Final   Gram Stain   Final    FEW WBC PRESENT,BOTH PMN AND MONONUCLEAR NO ORGANISMS SEEN    Culture   Final    RARE ESCHERICHIA COLI NO ANAEROBES ISOLATED Performed at [facility], [address] [place], [place] [address]    Report Status 09/06/2020 FINAL  Final   Organism ID, Bacteria ESCHERICHIA COLI  Final      Susceptibility   Escherichia coli - MIC*    AMPICILLIN 8 SENSITIVE Sensitive     CEFAZOLIN <=4 SENSITIVE Sensitive     CEFEPIME <=0.12 SENSITIVE Sensitive     CEFTAZIDIME <=1 SENSITIVE Sensitive     CEFTRIAXONE <=0.25 SENSITIVE Sensitive     CIPROFLOXACIN >=4 RESISTANT Resistant     GENTAMICIN <=1 SENSITIVE Sensitive     IMIPENEM  0.5 SENSITIVE Sensitive     TRIMETH/SULFA >=320 RESISTANT Resistant     AMPICILLIN/SULBACTAM 4 SENSITIVE Sensitive     PIP/TAZO <=4 SENSITIVE Sensitive     * RARE ESCHERICHIA COLI    Lab Basic Metabolic Panel: Recent Labs  Lab 09/04/20 0229 09/05/20 0809 09/06/20 0048 09/07/20 0257 09-18-20 0337  NA 135 138 141 145 146*  K 3.7 4.0 3.3* 3.5 3.6  CL 99 101 101 103 101  CO2 25 24 26 27 29   GLUCOSE 103* 146* 153* 175* 195*  BUN <5* 23 29* 36* 36*  CREATININE 0.58* 1.07 1.02 1.09 0.91  CALCIUM 8.7* 8.8* 8.9 9.1 9.1   CBC: Recent Labs  Lab 09/04/20 0229 09/05/20 0809 09/06/20 0048 09/07/20 0257 09/18/2020 0337  WBC 5.6 7.2 5.5 6.6 7.5  HGB 11.4* 10.9* 10.9* 10.6* 10.8*  HCT 35.3* 34.9* 34.4* 34.3* 35.1*  MCV 85.1 86.0 84.5 86.6 87.3  PLT 314 372 371 313 315   Sepsis Labs: Recent Labs  Lab 09/05/20 0809 09/06/20 0048 09/07/20 0257 18-Sep-2020 0337  WBC 7.2 5.5 6.6  7.5    Procedures/Operations   ANTERIOR CERVICAL DISCECTOMY FUSION CERVICAL THREE-FOUR AND ,CERVICAL FOUR -FIVE 09/10/20, MD 08/30/2020  L3-L4 fluoroguided aspirations x 2 passes. x1 with 21G and x1 with 20 G needles.  09/03/20, MD 09/10/2020    09/03/20, MD 2020/09/18, 6:01 PM

## 2020-09-14 NOTE — Accreditation Note (Signed)
Restraints not reported to CMS Pursuant to regulation 482.13 (G) (3) use of soft wrist restraints was logged on 09/13/2020 at 0910.

## 2020-09-14 NOTE — Progress Notes (Signed)
This nurse spoke with honor bridge reference number [phone] and spoke with Ranae Palms patient potential for tissue donor. Spoke with medical examinee and patient not a medical examiner case.

## 2020-09-14 NOTE — Progress Notes (Signed)
Pt continuously pulling at oxygen tubing causing oxygen sats to drop to 65-70 range. Mittens were uneffective. Spoke with md on call and rec'd order to apply  Non-violent restraints. After oxygen replaced sats did rebound.

## 2020-09-14 NOTE — TOC Progression Note (Signed)
Transition of Care [facility]) - Progression Note    Patient Details  Name: [name] MRN: [id number] Date of Birth: [birth year]  Transition of Care [facility]) CM/SW Contact  Lorri Frederick, LCSW Phone Number: 09/03/2020, 1:42 PM  Clinical Narrative:   CSW spoke with pt [name], legal guardian, who was in pt room.  Discussed that pt is not stable to return to [place], discussed palliative referral.  She had questions for MD, messaged him with this request.      Expected Discharge Plan: Skilled Nursing Facility Barriers to Discharge: Continued Medical Work up  Expected Discharge Plan and Services Expected Discharge Plan: Skilled Nursing Facility                                               Social Determinants of Health (SDOH) Interventions    Readmission Risk Interventions Readmission Risk Prevention Plan 05/26/2019 09/20/2018 09/19/2018  Transportation Screening Complete Complete Complete  PCP or Specialist Appt within 3-5 Days Not Complete - -  HRI or Home Care Consult Complete - -  Social Work Consult for Recovery Care Planning/Counseling Complete - -  Palliative Care Screening Not Complete - -  Medication Review [employer]) Complete Complete Complete  PCP or Specialist appointment within 3-5 days of discharge - Complete Complete  PCP/Specialist Appt Not Complete comments - - SNF resident. Sees facility MD  HRI or Home Care Consult - Not Complete -  HRI or Home Care Consult Pt Refusal Comments - Patient is discharging to SNF for rehab -  SW Recovery Care/Counseling Consult - Complete -  Palliative Care Screening - Not Applicable -  Skilled Nursing Facility - Complete -  Some recent data might be hidden

## 2020-09-14 NOTE — Consult Note (Signed)
Consultation Note Date: Sep 24, 2020   Patient Name: [name]  DOB: 09-12-1950  MRN: [id number]  Age / Sex: 70 y.o., male  PCP: Jani Gravel, MD Referring Physician: Patrecia Pour, MD  Reason for Consultation: Establishing goals of care related to nutrition.  HPI/Patient Profile: 69 y.o. male  with past medical history of intellectual disability, kidney stones, gout, cholecystitis, small bowel volvulus, DM, urinary retention, and E-coli bacteremia (06/2020) who was admitted on 08/22/2020 with bilateral lower extremity weakness.  An outpatient MRI done 08/25/2020 showed abnormalities in his spinal cord including severe spinal stenosis at multiple levels in his cervical spine and possibly discitis vs degeneration in his lumbar spine. On 5/19 he underwent a successful anterior decompression of his C3 - C5.  On 5/26 he underwent aspiration of L3 - L4 .  Cultures were positive for E-coli.  Over the course of his hospitalization he had multiple episodes of aspiration leading to tachycardia and hypoxia.  PMT was asked to discuss goals of care with regard to nutrition.  Clinical Assessment and Goals of Care: I have reviewed medical records including EPIC notes, labs and imaging, received report from RN, assessed the patient and then met at the bedside along with his Conni Slipper ([place]) to discuss diagnosis prognosis, [place], EOL wishes, disposition and options.  I introduced Palliative Medicine as specialized medical care for people living with serious illness. It focuses on providing relief from the symptoms and stress of a serious illness. The goal is to improve quality of life for both the patient and the family.  We discussed a brief life review of the patient and then focused on their current illness. The natural disease trajectory and expectations at EOL were discussed.  S[place]ell and [place] were raised like siblings by  [place] mother although Herson was actually her grandson.  At approximately [age] years of age Kyan stopped speaking.  He grew into a "big strong man" but approximately 5 years ago he fell.  Since that time the family really noticed a decline.  He had a severely infected gall bladder and then the small bowel volvulus.  [name] became his guardian.  In March Aliou was still walking.  He was admitted and treated for E-coli bacteremia but at discharge from the hospital [name] noticed he could no longer walk.  She took him back to the ER but no etiology was found for his inability to walk until his MRI on 5/12.  [name] and I talked about aspiration, cor trak tubes, PEG tubes, and comfort feeds.  [name] understands the risk of aspiration but agrees that Juel would not do well with a feeding tube.  She feels he would rip it out.   She opts to feed him with aspiration precautions and accept the risk of aspiration pneumonia.   [name] tells me she has not spoken with the doctor and asks if she could speak with Quenton's doctor today.  I communicated with Dr. Bonner Puna who agreed to meet with [facility] ASAP.  Questions and concerns were addressed.  The family was encouraged to call with questions or concerns.  PMT will continue to support holistically.     Primary Decision Maker:  LEGAL GUARDIAN [name]    SUMMARY OF RECOMMENDATIONS    Guardian opts against a feeding tube and in favor of careful hand feeding with aspiration precautions.  PMT will continue to follow with you.  Code Status/Advance Care Planning:  Full   Symptom Management:   Per primary  Additional Recommendations (Limita,tions, Scope, Preferences):  No Artificial Feeding  Palliative Prophylaxis:   Aspiration, Oral Care, Palliative Wound Care and Turn Reposition  Psycho-social/Spiritual:   Desire for further Chaplaincy support: Welcomed.  Christian.  Prognosis:  Poor prognosis given fragility, recent rapid decline, E-coli in  the spinal cord and aspiration pneumonia.    Discharge Planning: To Be Determined, possibly home with hospice vs return to SNF       Primary Diagnoses: Present on Admission: . Lower extremity weakness . Urine retention . HTN (hypertension) . Nonverbal . Cervical spondylosis with myelopathy and radiculopathy   I have reviewed the medical record, interviewed the patient and family, and examined the patient. The following aspects are pertinent.  Past Medical History:  Diagnosis Date  . Diabetes mellitus without complication ([place])   . History of gout   . History of kidney stones   . Hypertension   . Mentally challenged   . Urinary retention    Social History   Socioeconomic History  . Marital status: Single    Spouse name: Not on file  . Number of children: Not on file  . Years of education: Not on file  . Highest education level: Not on file  Occupational History  . Not on file  Tobacco Use  . Smoking status: Never Smoker  . Smokeless tobacco: Never Used  Vaping Use  . Vaping Use: Never used  Substance and Sexual Activity  . Alcohol use: No  . Drug use: No  . Sexual activity: Never    Birth control/protection: None  Other Topics Concern  . Not on file  Social History Narrative   Patient is delayed lives with family   Social Determinants of Health   Financial Resource Strain: Not on file  Food Insecurity: Not on file  Transportation Needs: Not on file  Physical Activity: Not on file  Stress: Not on file  Social Connections: Not on file   Family History  Problem Relation Age of Onset  . Gallbladder disease Other     No Known Allergies    Vital Signs: BP (!) 117/53 (BP Location: Left Arm)   Pulse (!) 121   Temp 98.9 F (37.2 C) (Oral)   Resp 17   Ht 6' 0.01" (1.829 m)   Wt 53.9 kg   SpO2 97%   BMI 16.11 kg/m  Pain Scale: 0-10   Pain Score: 0-No pain   SpO2: SpO2: 97 % O2 Device:SpO2: 97 % O2 Flow Rate: .O2 Flow Rate (L/min): 5  L/min    Palliative Assessment/Data: 20%     Time In: 1:00 Time Out: 2:10 Time Total: 70 min. Visit consisted of counseling and education dealing with the complex and emotionally intense issues surrounding the need for palliative care and symptom management in the setting of serious and potentially life-threatening illness. Greater than 50%  of this time was spent counseling and coordinating care related to the above assessment and plan.  Signed by: Florentina Jenny, PA-C Palliative Medicine  Please contact Palliative Medicine Team phone at [phone] for  questions and concerns.  For individual provider: See Shea Evans

## 2020-09-14 DEATH — deceased

## 2020-09-28 ENCOUNTER — Inpatient Hospital Stay: Payer: Medicare Other | Admitting: Family

## 2020-09-28 ENCOUNTER — Ambulatory Visit: Payer: Medicare Other | Admitting: Urology

## 2020-10-04 ENCOUNTER — Ambulatory Visit (INDEPENDENT_AMBULATORY_CARE_PROVIDER_SITE_OTHER): Payer: Medicare Other | Admitting: Internal Medicine

## 2020-12-02 ENCOUNTER — Ambulatory Visit: Payer: Medicare Other | Admitting: Urology

## 2024-03-23 ENCOUNTER — Other Ambulatory Visit (HOSPITAL_COMMUNITY): Payer: Self-pay
